# Patient Record
Sex: Female | Born: 1945
Health system: Southern US, Community
[De-identification: ages and names within clinical notes are randomized; demographics above are authoritative.]

## PROBLEM LIST (undated history)

## (undated) DIAGNOSIS — L723 Sebaceous cyst: Secondary | ICD-10-CM

## (undated) DIAGNOSIS — E785 Hyperlipidemia, unspecified: Secondary | ICD-10-CM

## (undated) DIAGNOSIS — B019 Varicella without complication: Secondary | ICD-10-CM

## (undated) DIAGNOSIS — J189 Pneumonia, unspecified organism: Secondary | ICD-10-CM

## (undated) DIAGNOSIS — I10 Essential (primary) hypertension: Secondary | ICD-10-CM

## (undated) DIAGNOSIS — D649 Anemia, unspecified: Secondary | ICD-10-CM

## (undated) DIAGNOSIS — R011 Cardiac murmur, unspecified: Secondary | ICD-10-CM

## (undated) DIAGNOSIS — R7303 Prediabetes: Secondary | ICD-10-CM

## (undated) DIAGNOSIS — C50919 Malignant neoplasm of unspecified site of unspecified female breast: Secondary | ICD-10-CM

## (undated) DIAGNOSIS — M810 Age-related osteoporosis without current pathological fracture: Secondary | ICD-10-CM

## (undated) DIAGNOSIS — E042 Nontoxic multinodular goiter: Secondary | ICD-10-CM

## (undated) DIAGNOSIS — G4730 Sleep apnea, unspecified: Secondary | ICD-10-CM

## (undated) DIAGNOSIS — R0600 Dyspnea, unspecified: Secondary | ICD-10-CM

## (undated) DIAGNOSIS — I714 Abdominal aortic aneurysm, without rupture, unspecified: Secondary | ICD-10-CM

## (undated) DIAGNOSIS — R51 Headache: Secondary | ICD-10-CM

## (undated) DIAGNOSIS — R519 Headache, unspecified: Secondary | ICD-10-CM

## (undated) DIAGNOSIS — Z9981 Dependence on supplemental oxygen: Secondary | ICD-10-CM

## (undated) DIAGNOSIS — J449 Chronic obstructive pulmonary disease, unspecified: Secondary | ICD-10-CM

## (undated) DIAGNOSIS — M1990 Unspecified osteoarthritis, unspecified site: Secondary | ICD-10-CM

## (undated) HISTORY — DX: Essential (primary) hypertension: I10

## (undated) HISTORY — DX: Age-related osteoporosis without current pathological fracture: M81.0

## (undated) HISTORY — DX: Varicella without complication: B01.9

## (undated) HISTORY — DX: Cardiac murmur, unspecified: R01.1

## (undated) HISTORY — PX: COLONOSCOPY: SHX174

## (undated) HISTORY — DX: Chronic obstructive pulmonary disease, unspecified: J44.9

## (undated) HISTORY — PX: APPENDECTOMY: SHX54

## (undated) HISTORY — DX: Unspecified osteoarthritis, unspecified site: M19.90

## (undated) HISTORY — DX: Hyperlipidemia, unspecified: E78.5

## (undated) HISTORY — PX: OTHER SURGICAL HISTORY: SHX169

---
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
## 1978-05-17 HISTORY — PX: TUBAL LIGATION: SHX77

## 1998-03-24 ENCOUNTER — Other Ambulatory Visit: Admission: RE | Admit: 1998-03-24 | Discharge: 1998-03-24 | Payer: Self-pay | Admitting: Obstetrics and Gynecology

## 1999-10-13 ENCOUNTER — Other Ambulatory Visit: Admission: RE | Admit: 1999-10-13 | Discharge: 1999-10-13 | Payer: Self-pay | Admitting: Obstetrics and Gynecology

## 2000-07-01 ENCOUNTER — Encounter: Admission: RE | Admit: 2000-07-01 | Discharge: 2000-07-12 | Payer: Self-pay | Admitting: Family Medicine

## 2004-10-13 ENCOUNTER — Ambulatory Visit (HOSPITAL_COMMUNITY): Admission: RE | Admit: 2004-10-13 | Discharge: 2004-10-13 | Payer: Self-pay | Admitting: Gastroenterology

## 2004-10-13 ENCOUNTER — Encounter (INDEPENDENT_AMBULATORY_CARE_PROVIDER_SITE_OTHER): Payer: Self-pay | Admitting: *Deleted

## 2004-10-14 ENCOUNTER — Other Ambulatory Visit: Admission: RE | Admit: 2004-10-14 | Discharge: 2004-10-14 | Payer: Self-pay

## 2006-08-26 ENCOUNTER — Encounter: Admission: RE | Admit: 2006-08-26 | Discharge: 2006-08-26 | Payer: Self-pay | Admitting: Internal Medicine

## 2006-09-06 ENCOUNTER — Inpatient Hospital Stay (HOSPITAL_COMMUNITY): Admission: EM | Admit: 2006-09-06 | Discharge: 2006-09-14 | Payer: Self-pay | Admitting: Emergency Medicine

## 2006-09-21 ENCOUNTER — Ambulatory Visit (HOSPITAL_COMMUNITY): Admission: RE | Admit: 2006-09-21 | Discharge: 2006-09-21 | Payer: Self-pay | Admitting: General Surgery

## 2006-09-30 ENCOUNTER — Ambulatory Visit (HOSPITAL_COMMUNITY): Admission: RE | Admit: 2006-09-30 | Discharge: 2006-09-30 | Payer: Self-pay | Admitting: General Surgery

## 2006-10-07 ENCOUNTER — Ambulatory Visit (HOSPITAL_COMMUNITY): Admission: RE | Admit: 2006-10-07 | Discharge: 2006-10-07 | Payer: Self-pay | Admitting: Obstetrics and Gynecology

## 2006-11-07 ENCOUNTER — Ambulatory Visit (HOSPITAL_COMMUNITY): Admission: RE | Admit: 2006-11-07 | Discharge: 2006-11-07 | Payer: Self-pay | Admitting: General Surgery

## 2006-11-29 ENCOUNTER — Encounter (INDEPENDENT_AMBULATORY_CARE_PROVIDER_SITE_OTHER): Payer: Self-pay | Admitting: General Surgery

## 2006-11-30 ENCOUNTER — Inpatient Hospital Stay (HOSPITAL_COMMUNITY): Admission: RE | Admit: 2006-11-30 | Discharge: 2006-12-05 | Payer: Self-pay | Admitting: General Surgery

## 2008-03-05 ENCOUNTER — Other Ambulatory Visit: Admission: RE | Admit: 2008-03-05 | Discharge: 2008-03-05 | Payer: Self-pay | Admitting: Otolaryngology

## 2008-03-15 ENCOUNTER — Encounter: Admission: RE | Admit: 2008-03-15 | Discharge: 2008-03-15 | Payer: Self-pay | Admitting: Otolaryngology

## 2010-09-29 NOTE — Op Note (Signed)
NAME:  Victoria, [name] NO.:  [id number]   MEDICAL RECORD NO.:  [id number]          PATIENT TYPE:  OIB   LOCATION:  1528                         FACILITY:  [facility]   PHYSICIAN:  Adolph Pollack, M.D.DATE OF BIRTH:  07-15-1945   DATE OF PROCEDURE:  11/29/2006  DATE OF DISCHARGE:                               OPERATIVE REPORT   PREOP DIAGNOSIS:  Right lower quadrant/pelvic inflammatory mass.   POSTOPERATIVE DIAGNOSIS:  Right lower quadrant/pelvic inflammatory mass.   PROCEDURE:  Diagnostic laparoscopy.   SURGEON:  Adolph Pollack, M.D.   INDICATIONS:  This 65 year old female has had a chronic right lower  quadrant inflammatory process etiology is unknown.  She presents now for  a diagnostic laparoscopy with possible removal of the mass or need for  conversion exploratory laparotomy.   TECHNIQUE:  She was brought to the operating room, placed supine on the  operating table and general anesthetic was administered.  A Foley  catheter was placed in the bladder; and she was placed in the lithotomy  position.  The abdominal wall and pelvic area were sterilely prepped and  draped.  The supraumbilical incision was made through skin, subcutaneous  tissue, fascia, and peritoneum; entering the peritoneal cavity under  direct vision.  A pursestring suture of #0 Vicryl was placed around the  fascial edges.  A Hassan trocar was introduced into the peritoneal  cavity; and a pneumoperitoneum created by insufflation of CO2 gas.   Next the laparoscope was introduced.  She was placed in the steep  Trendelenburg position.  No significant adhesions between the omentum or  intestine and the anterior abdominal wall were noted.  I placed a 5-mm  trocar in the lower midline.  I began identifying the left ovary which  appeared to be normal; and the uterus appeared to be normal.  There were  some of adhesions to the right lower quadrant; and upon manipulating  these, I noticed an  inflammatory process.  There is some purulent fluid  present.   I placed a 5-mm trocar in the right upper quadrant and used it to  evacuate some of the purulent fluid.  I subsequently was able to  mobilize and identify the appendix.  The base of the appendix appeared  normal.  There did appear to be  some periappendiceal inflammation.  I then identified the area of the  right ovary; and upon manipulating this,  purulent material continued to  drain out from an area.  Based on this I did not think that we would be  able to proceed laparoscopy; and, thus, exploratory laparotomy was  planned to be dictated in a separate note.      Adolph Pollack, M.D.  Electronically Signed     TJR/MEDQ  D:  11/29/2006  T:  11/29/2006  Job:  [id number]   cc:   Juluis Mire, M.D.  Fax: [phone]   Olene Craven, M.D.  Fax: [phone]

## 2010-09-29 NOTE — H&P (Signed)
NAME:  Victoria [name], Victoria [name] NO.:  [id number]   MEDICAL RECORD NO.:  [id number]          PATIENT TYPE:  OIB   LOCATION:  1528                         FACILITY:  [facility]   PHYSICIAN:  Adolph Pollack, M.D.DATE OF BIRTH:  January 03, 1946   DATE OF ADMISSION:  11/29/2006  DATE OF DISCHARGE:                              HISTORY & PHYSICAL   REASON:  Elective operation.   HISTORY OF PRESENT ILLNESS:  Victoria [name] is a  65 year old female who has  a chronic ongoing right lower quadrant/pelvic inflammatory process.  Her  illness started back in the spring, in early April where she developed  lower abdominal pain and was felt to have urinary tract infection.  For  3 weeks she was on different rounds of antibiotics.  Eventually she  continued to have right lower quadrant pelvic pain and a leukocytosis  developed.  A CT scan demonstrated a complex inflammatory process in the  right pelvic region.  She was  admitted to the hospital and underwent  antibiotic treatment and percutaneous drainage and improved.  Since that  time, we have been following her with serial x-rays and CT scans  expecting this to resolve.  She is currently asymptomatic, has been off  antibiotics for a long time but continues to have evidence of a right  pelvic inflammatory mass.  She has been seen by Dr. Richardean Chimera, and now  is admitted for a laparoscopy, possible laparotomy and removal of this  inflammatory mass which could include appendix and ovary.   PAST MEDICAL HISTORY:  1. Asthma  2. Chronic thyroid nodule.  3. Hypertension.  4. Hypercholesterolemia.  5. Gastritis.  6. Pneumonia.   PREVIOUS OPERATIONS:  Cesarean section.   ALLERGIES:  CODEINE, BENICAR, AVAPRO.   MEDICATIONS:  Lisinopril, HCTZ, lovastatin, Advair inhaler, Norvasc,  Allegra, Clinoril   SOCIAL HISTORY:  She has a smoker and smokes a half pack of cigarettes a  day.  She occasionally uses alcohol.  She is single.   FAMILY HISTORY:   Notable for coronary artery disease, diabetes and  Alzheimer's.   PHYSICAL EXAM:  GENERAL:  A well-developed, well-nourished female.  She  is in no acute distress, pleasant and cooperative.  EYES:  Extraocular motions intact.  No icterus noted.  NECK is supple with good range of motion.  RESPIRATORY:  The breath sounds are equal and clear and the respirations  are nonlabored.  CARDIOVASCULAR: Regular rate, regular rhythm.  No murmur.  ABDOMEN: Soft with lower midline scar.  No palpable masses or  tenderness.  MUSCULOSKELETAL:  SCD hose on lower extremities.   IMPRESSION:  Right lower quadrant/pelvic inflammatory mass ill-defined.  Could be tubo-ovarian abscess or chronic appendiceal abscess.   PLAN:  Diagnostic laparoscopy, possible exploratory laparotomy.      Adolph Pollack, M.D.  Electronically Signed     TJR/MEDQ  D:  11/29/2006  T:  11/29/2006  Job:  [id number]

## 2010-09-29 NOTE — Op Note (Signed)
NAME:  Victoria [name], RHEE NO.:  [id number]   MEDICAL RECORD NO.:  [id number]          PATIENT TYPE:  OIB   LOCATION:  1528                         FACILITY:  [facility]   PHYSICIAN:  Adolph Pollack, M.D.DATE OF BIRTH:  1946-03-09   DATE OF PROCEDURE:  11/29/2006  DATE OF DISCHARGE:                               OPERATIVE REPORT   PREOPERATIVE DIAGNOSIS:  Right lower quadrant pelvic inflammatory mass.   POSTOPERATIVE DIAGNOSIS:  Right lower quadrant pelvic inflammatory mass.   PROCEDURE:  1. Exploratory laparotomy.  2. Appendectomy,.  3. Right oophorectomy.   SURGEON:  Adolph Pollack, M.D.   Mammie LorenzoJuluis Mire, M.D.   ANESTHESIA:  General.   INDICATIONS:  This 65 year old female has a chronic right lower  quadrant/right pelvic inflammatory process.  I performed a diagnostic  laparoscopy; and discovered her to have a somewhat of an abscess which  could have been either emanating from the appendix or the ovary, but I  did not think the operation could be performed laparoscopically; thus,  the laparoscopic procedure was aborted; and now we are proceeding with  exploratory laparotomy.   TECHNIQUE:  I made a lower midline incision through the previous scar  dividing the skin, subcutaneous tissue, and peritoneum.  A self-  retaining retractor was placed.  I had mobilized the appendix  laparoscopically; and I brought this up into the wound.  There was one  area of the mid appendix that appeared to be somewhat disrupted.   I amputated the appendix off of the cecum with a GIA stapler; and then  divided the mesoappendix between clamps and ligated the vessels with  Vicryl ties.  I then handed the appendix off the field.  There did not  to appear to be any evidence of Crohn's disease.   Following this, Dr. Arelia Sneddon then performed a right oophorectomy.  There  was some purulent drainage from the ovarian area and I sent this for  culture.  His oophorectomy  will be dictated in separate note.   After the oophorectomy was complete, the pelvic area was then copiously  irrigated with saline solution; the solution evacuated.  Hemostasis was  noted be adequate.  The instrument count and sponge count were reported  to be correct.  I then closed the lower midline fascia with a running #1  PDS suture.  The subcutaneous tissue was irrigated.  The supraumbilical  fascial defect was closed by tightening up and tying down the  pursestring suture.  All skin incisions were then closed with staples;  and sterile dressings were applied.   She tolerated the procedure well without apparent complications; and was  taken to recovery in satisfactory condition.      Adolph Pollack, M.D.  Electronically Signed     TJR/MEDQ  D:  11/29/2006  T:  11/29/2006  Job:  [id number]   cc:   Juluis Mire, M.D.  Fax: [phone]   Olene Craven, M.D.  Fax: [phone]

## 2010-09-29 NOTE — Op Note (Signed)
NAME:  Victoria [name], Victoria [name] NO.:  [id number]   MEDICAL RECORD NO.:  [id number]          PATIENT TYPE:  OIB   LOCATION:  1528                         FACILITY:  [facility]   PHYSICIAN:  Juluis Mire, M.D.   DATE OF BIRTH:  1945/07/06   DATE OF PROCEDURE:  11/29/2006  DATE OF DISCHARGE:                               OPERATIVE REPORT   PREOPERATIVE DIAGNOSIS:  Pelvic abscess.   POSTOPERATIVE DIAGNOSIS:  Probable tubal-ovarian abscess, possible  involvement of the appendix.   PROCEDURE:  Right salpingo-oophorectomy.   SURGEON:  Juluis Mire, M.D.   ASSISTANT:  Adolph Pollack, M.D.   ESTIMATED BLOOD LOSS:  Was 200 to 300 mL.   PACKS/DRAINS:  None.   INTRAOPERATIVE BLOOD PLACEMENT:  None.   COMPLICATIONS:  None.   INDICATIONS FOR PROCEDURE:  The patient is a 65 year old post-menopausal  patient who has been followed with a right pelvic abscess.  It has  failed to resolve over time.  The decision to proceed with exploratory  surgery.  The risks were explained, including the risks of infection,  the risks of hemorrhage that could require transfusion, the risks of  hepatitis, the risks of injury to adjacent organs including bladder,  bowel or ureters, could require further exploratory surgery, the risks  of deep venous thrombosis and pulmonary embolus.   DESCRIPTION OF PROCEDURE:  Of note, Dr. Abbey Chatters had already  laparoscoped the patient.  We discovered the possibility of a combined  issue with the appendix and the right tube and ovary.  The decision to  proceed with an exploratory surgery.  Dr. Abbey Chatters proceeded with a  midline incision.  He did do an appendectomy.  At this point I came in.  We elevated the right ovary.  There was purulent material coming from  the ovary and tube.  We elevated the peritoneum over the right psoas  muscle and incised that and extended the incision up to the round  ligament and also cephalad.  We developed the right  retroperitoneal  space.  We identified the ureter.  We isolated the ovarian vasculature  above the ureter and clamped and cut it and doubly ligated first with a  free tie of #0 Vicryl and a suture ligature of #0 Vicryl.  We dissected  the ovary from its peritoneal attachments up to the utero-ovarian  pedicle.  It was clamped and cut and the uterus and tube were passed off  the operative field.  The utero-ovarian pedicles were then secured with  a suture ligature of #0 Vicryl.  Some oozing was encountered and also  brought under control with suture ligature of #0 Vicryl.  We identified  the ureter again and felt it was well out of the operative field.  We  used cautery ring for further hemostasis along the peritoneal edge.  We  then thoroughly irrigated the pelvis.  At this time we had good  hemostasis.  The uterus was unremarkable.  The left tube and ovary were  also clear.  At this point in time, the self-retaining retractor and  packs were removed.  Dr. Abbey Chatters completed closure of the abdominal  cavity.  The sponge, needle and instrument counts were correct by the  circulating nurse x2.  The Foley catheter remained clear at the time of  closure.   The patient tolerated the procedure well and was returned to the  recovery room in good condition.      Juluis Mire, M.D.  Electronically Signed     JSM/MEDQ  D:  11/29/2006  T:  11/29/2006  Job:  [id number]

## 2010-10-02 NOTE — H&P (Signed)
NAME:  Victoria [name], Victoria [name] NO.:  [id number]   MEDICAL RECORD NO.:  [id number]          PATIENT TYPE:  EMS   LOCATION:  ED                           FACILITY:  [facility]   PHYSICIAN:  Adolph Pollack, M.D.DATE OF BIRTH:  June 29, 1945   DATE OF ADMISSION:  09/05/2006  DATE OF DISCHARGE:                              HISTORY & PHYSICAL   CHIEF COMPLAINT:  Right pelvic pain and right lower quadrant pain.   HISTORY OF PRESENT ILLNESS:  This 65 year old female about 3 weeks ago  had a large BM and then developed some lower abdominal discomfort that  persisted.  She subsequently presented to [facility] and was told she  had a urinary tract infection.  She is placed on Cipro but continued to  have the pain.  She had an antibiotic shot and was placed on antibiotic  pills.  She continued to have problems with the pain.  She had a chill  on Friday, a few days ago.  She has had an outpatient ultrasound which  was negative.  She went back to [facility] today as she was feeling  worse with increasing pain and was noted to have a leukocytosis.  She  was sent to [facility] emergency department for evaluation.  White  count of 20,500 was noted.  A CT scan was ordered for the abdomen and  pelvis.  It demonstrated a complex inflammatory process in the right  pelvis which could be secondary to appendicitis versus tubo-ovarian  abscess.  At that point, I was asked to see her.  She continues to  complain of right pelvic-type pain and has had anorexia.  She has not  had a significant fever she tells me.   PAST MEDICAL HISTORY:  1. Hypertension.  2. Thyroid nodule.  3. Hypercholesterolemia.  4. Gastritis.  5. Pneumonia.  6. Asthma.   PREVIOUS OPERATIONS:  Cesarean section.   ALLERGIES:  Codeine.   MEDICATIONS:  Advair Diskus, lovastatin, HCTZ, amlodipine, Allegra,  aspirin.   SOCIAL HISTORY:  She is single and her son lives with her.  She has a  long history of tobacco use.   Occasional alcohol use.   REVIEW OF SYSTEMS:  CARDIOVASCULAR:  She denies any circulatory problems  or heart disease.  PULMONARY:  She denies tuberculosis.  GI:  She denies  peptic ulcer disease, hepatitis or diverticulitis.  GU: No kidney  stones.  NEUROLOGIC:  No strokes or seizures.  HEMATOLOGIC:  No bleeding  disorders, transfusions or blood clots.   PHYSICAL EXAMINATION:  GENERAL:  This is a moderately overweight female  who appears somewhat uncomfortable.  VITAL SIGNS:  Temperature 99.4, blood pressure 158/85, and pulse 84.  EYES:  Extraocular motions are intact.  No icterus.  NECK:  Supple with a palpable left thyroid nodule in the lower neck  area.  RESPIRATORY:  Breath sounds are equal.  Clear respirations, nonlabored.  CARDIOVASCULAR:  Regular rate and regular rhythm.  No murmur heard.  ABDOMEN:  Soft with lower midline scar.  There is right lower quadrant  and pelvic region tenderness as well as  suprapubic tenderness to  palpation.  No peritoneal signs, however.  Active bowel sounds are  noted.  No hernias.  MUSCULOSKELETAL:  No edema.  Good muscle tone.   LABORATORY DATA:  Notable for white cell count of 20,500 and hemoglobin  13.5.  Potassium 3.2, glucose 122, albumin 2.8.  CT scan was reviewed.   IMPRESSION:  1. Right lower quadrant complex inflammatory process which could be      secondary to a chronic-type appendicitis versus tubo-ovarian      abscess.  I do not think this is an acute (24 to 48 hour) issue as      she has been having symptoms for about 3 weeks with intermittent      treatment for urinary tract infection.  2. Hypokalemia.  3. Hypertension.  4. Thyroid nodule.   PLAN:  We will admitted her to the hospital and start her on broad-  spectrum intravenous antibiotics.  We will follow her clinical course  and white blood cell count and potentially repeat the CT scan in a few  days to see if we can monitor some improvement or if there becomes an  obvious  fluid collection then it can be percutaneously drained.  As for  the thyroid nodule, I explained to her that she would need to have that  followed by Dr. Andi Devon and  potentially have a fine-needle aspiration  done.      Adolph Pollack, M.D.  Electronically Signed     TJR/MEDQ  D:  09/06/2006  T:  09/06/2006  Job:  [id number]   cc:   Gabriel Earing, M.D.  Fax: [phone]

## 2010-10-02 NOTE — Consult Note (Signed)
NAME:  Victoria [name], Victoria [name] NO.:  [id number]   MEDICAL RECORD NO.:  [id number]          PATIENT TYPE:  INP   LOCATION:  1323                         FACILITY:  [facility]   PHYSICIAN:  Freddy Finner, M.D.   DATE OF BIRTH:  May 27, 1945   DATE OF CONSULTATION:  09/13/2006  DATE OF DISCHARGE:                                 CONSULTATION   REFERRING PHYSICIAN:  Adolph Pollack, M.D.   The patient is a 65 year old African-American widowed female, gravida 3,  para 1, AB 2, whose present illnesses are completely outlined in Dr.  Maris Berger note and consists of approximately 3-1/2 weeks of illness  with first presentation on March 30 to Urgent Care at which time she was  thought to have a urinary tract infection.  Followup visit there after  treatment with Cipro resulted in a pelvic ultrasound which is said to  have been normal.  Of significance in her GYN history is the fact that  she has not been sexually active for the last 13 years and that she had  a previous bilateral tubal ligation for contraceptive reasons.   Her Chief Complaint on admission was the onset of very severe right  lower quadrant and lower abdominal pain after a large bowel movement.  Extensive evaluation and treatment has already been completed including  CTs of the abdomen on two different occasions showing a pelvic mass on  the right side with question of appendiceal abscess versus tubo-ovarian  abscess.  Skinny needle aspiration of the mass has produced 15 mL of  what appears to be sterile pus at this point in time.   I had a discussion with [name] regarding her current medical  situation.  I shared with her that possibility of tubal infection of the  typical type seems extremely unlikely given her previous tubal  sterilization and the lack of any exposure to sexually transmitted  disease at this point in time.  The patient is very articulate and  oriented and has a full understanding.  Her last  followup in our office  was with Dr. Arelia Sneddon in 2001.  Apparently she does discontinued care only  because of the change in insurance policy in our office regarding  [employer].  She states that she has not had a pelvic exam or Pap smear  since that time but did in fact have a mammogram approximately 2 years  ago with [facility].   I completely concur with the plan of management at the present time.  I  have indicated verbally and now in writing that we will be happy to  participate in surgical intervention in the event that this becomes  necessary.   Thanks for the consultation.      Freddy Finner, M.D.  Electronically Signed     WRN/MEDQ  D:  09/13/2006  T:  09/13/2006  Job:  [id number]   cc:   Adolph Pollack, M.D.  [address]  [place]  [place] [address]   Juluis Mire, M.D.  Fax: [phone]

## 2010-10-02 NOTE — Discharge Summary (Signed)
NAME:  Victoria [name], Victoria [name] NO.:  [id number]   MEDICAL RECORD NO.:  [id number]          PATIENT TYPE:  INP   LOCATION:  1528                         FACILITY:  [facility]   PHYSICIAN:  Adolph Pollack, M.D.DATE OF BIRTH:  06-Aug-1945   DATE OF ADMISSION:  11/30/2006  DATE OF DISCHARGE:  12/05/2006                               DISCHARGE SUMMARY   PRINCIPAL DISCHARGE DIAGNOSIS:  Right pelvic inflammatory process  secondary to chronic tubo-ovarian abscess.   SECONDARY DIAGNOSES:  1. Hypertension.  2. Mild postoperative ileus.   PROCEDURES:  Diagnostic laparoscopy converted to exploratory laparotomy,  appendectomy and right oophorectomy on November 29, 2006.   REASON FOR ADMISSION:  This is a 65 year old female with a complex right  pelvic inflammatory process that has been persistent over time.  It is  felt this is either secondary to chronic ruptured appendix or ovarian  pathology.  She has had an extensive workup by myself and Dr. Richardean Chimera.  The process persists and she was subsequently admitted.   HOSPITAL COURSE:  She underwent the above procedure and actually  tolerated it fairly well.  She was started on sips of liquids the day  after her surgery.  She is maintained on IV cefoxitin.  Hemoglobin was  stable at 12.5.  her ileus slowly resolved.  The wound looked good.  By  her sixth postoperative day, she was tolerating a diet.  She is having  bowel movements and flatus.  Blood pressures are under fair control.  She is able to be discharged.   DISPOSITION:  Discharged to home in satisfactory condition.  She will be  given discharge instructions and Tylox for pain.  She is to continue her  home medications and follow up with me in 3 days for staple removal.      Adolph Pollack, M.D.  Electronically Signed     TJR/MEDQ  D:  12/15/2006  T:  12/16/2006  Job:  [id number]   cc:   Juluis Mire, M.D.  Fax: [phone]   Olene Craven, M.D.  Fax:  [phone]

## 2010-10-02 NOTE — Discharge Summary (Signed)
NAME:  Victoria [name], Victoria [name] NO.:  [id number]   MEDICAL RECORD NO.:  [id number]          PATIENT TYPE:  INP   LOCATION:  1323                         FACILITY:  [facility]   PHYSICIAN:  Adolph Pollack, M.D.DATE OF BIRTH:  08-15-1945   DATE OF ADMISSION:  09/05/2006  DATE OF DISCHARGE:                               DISCHARGE SUMMARY   PRINCIPAL DISCHARGE DIAGNOSIS:  Right pelvic infectious/inflammatory  process (abscess).   SECONDARY DIAGNOSES:  1. Hypertension.  2. Hyperlipidemia.  3. Thyroid nodule.  4. Gastritis.  5. Pneumonia.  6. Asthma.   PROCEDURE:  Percutaneous aspiration of the right lower quadrant  infectious/inflammatory process by interventional radiology September 11, 2006.   REASON FOR ADMISSION:  Ms. Lallier is a 65 year old female who, 3 weeks  prior to admission, had lower abdominal discomfort that persisted.  It  was felt that she had a urinary tract infection and she received  antibiotics and still had some symptoms, so received other types of  antibiotics as well.  She continued to have symptoms and worsening right  pelvic and lower quadrant pain.  She had a leukocytosis at [facility]  when she was being treated.  She was sent to the emergency department at  [facility] and had a white cell count of 20,500, and a CT scan  that demonstrated a complex right pelvic/lower quadrant inflammatory  process which could have been secondary to a chronic ruptured  appendicitis versus a tubo-ovarian abscess.  She subsequently was  admitted.   HOSPITAL COURSE:  She was admitted and placed on intravenous  antibiotics.  She slowly began improving.  CT scan was repeated September 09, 2006, which showed some coalescence and some fluid collection,  slight increase in size of inflammatory process.  White cell count at  that time was down to 16,300.  A percutaneous aspiration was arranged  and purulent material evacuated and sent for culture.  It came back  showing some gram-variable rods as well as streptococci.  After the  aspiration, she began feeling better.  Her white blood cell count  started to decline.  A GYN consultation was obtained by Dr. Abbey Chatters,  and he had concurred with the possibility of the appendix or tubo-  ovarian abscess as being the etiology; however, felt that surgery was  not needed, as well, as she was improving.  She was started on a diet as  a regular diet, bowels were moving.  She had decreasing pain.  On September 14, 2006, after a little over a week of IV Zosyn, her white count was  down at 12,800.  She had remained afebrile, was nontender and was able  to be discharged.   DISPOSITION:  Discharged to home on p.o. antibiotics (Augmentin and  Flagyl).  She will follow up with me next week for repeat examination  and the scheduled repeat CT scan.  She had been given discharge  instructions.  The  long-term plan for her would be hopefully to have this process resolved  and do more focused studies on the right ovary as well, and,  potentially, she may need a delayed appendectomy or right salpingo-  oophorectomy or both.  I have discussed this with her.      Adolph Pollack, M.D.  Electronically Signed     TJR/MEDQ  D:  09/14/2006  T:  09/14/2006  Job:  [id number]   cc:   Freddy Finner, M.D.  Fax: [phone]   Gabriel Earing, M.D.  Fax: [phone]

## 2010-12-14 ENCOUNTER — Ambulatory Visit: Payer: Self-pay | Admitting: Internal Medicine

## 2011-01-21 ENCOUNTER — Ambulatory Visit (INDEPENDENT_AMBULATORY_CARE_PROVIDER_SITE_OTHER): Payer: Medicare PPO | Admitting: Internal Medicine

## 2011-01-21 ENCOUNTER — Encounter: Payer: Self-pay | Admitting: Internal Medicine

## 2011-01-21 DIAGNOSIS — M129 Arthropathy, unspecified: Secondary | ICD-10-CM

## 2011-01-21 DIAGNOSIS — I10 Essential (primary) hypertension: Secondary | ICD-10-CM

## 2011-01-21 DIAGNOSIS — M1990 Unspecified osteoarthritis, unspecified site: Secondary | ICD-10-CM

## 2011-01-21 DIAGNOSIS — E785 Hyperlipidemia, unspecified: Secondary | ICD-10-CM

## 2011-01-21 NOTE — Progress Notes (Signed)
Subjective:    Patient ID: Victoria [name], female    DOB: Aug 27, 1945, 65 y.o.   MRN: [id number]  HPI Victoria [name] presents to establish for on-going continuity care on referral from Dr. Arelia Sneddon. She is feeling well and has no specific problems to address today.   Past Medical History  Diagnosis Date  . Asthma     uses advair   . Arthritis     left ankle, right knee, right SI joint, wrists  . Varicella as child  . 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 Hypertension   . Hyperlipidemia   . Tuberculosis 2009    h/o positive PPD- seen at health department, had  normal chest x-ray. she was not treated for a  new conversion.   Past Surgical History  Procedure Date  . Excision of pelvic absess, right ovary     2008  . Appendectomy     2008  . Cesarean section     1972   Family History  Problem Relation Age of Onset  . Heart disease Mother     MI - fatal  . Hypertension Mother   . Stroke Father 54    fatal  . Alzheimer's disease Father   . Alzheimer's disease Brother   . Hyperlipidemia Brother   . Hypertension Brother   . Diabetes Brother   . Hypertension Brother   . Hyperlipidemia Brother    History   Social History  . Marital Status: Divorced    Spouse Name: N/A    Number of Children: 1  . Years of Education: 16   Occupational History  . 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 Smoking status: Current Everyday Smoker -- 0.5 packs/day for 40 years    Types: Cigarettes  . Smokeless tobacco: Never Used  . Alcohol Use: Yes     rare occasion  . Drug Use: No  . Sexually Active: No   Other Topics Concern  . Not on file   Social History Narrative   HSG; Victoria [name], Victoria [name]-political science. . Married '69 - 9 yrs/divorced. 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 No h/o physical or sexual abuse. ACP - no living will - wants information. Provided packet of information.         Review of Systems Review of Systems  Constitutional:  Negative for fever, chills, activity change and unexpected weight change.  HEENT:  Negative for hearing loss, ear pain, congestion, neck stiffness and postnasal drip. Negative for sore throat or swallowing problems. Negative for dental complaints.   Eyes: Negative for vision loss or change in visual acuity.  Respiratory: Negative for chest tightness and wheezing.   Cardiovascular: Negative for chest pain and palpitation. No decreased exercise tolerance Gastrointestinal: No change in bowel habit. No bloating or gas. No reflux or indigestion Genitourinary: Negative for urgency, frequency, flank pain and difficulty urinating.  Musculoskeletal: Positive for myalgias, back pain, arthralgias and gait problem.  Neurological: Negative for dizziness, tremors, weakness and headaches.  Hematological: Negative for adenopathy.  Psychiatric/Behavioral: Negative for behavioral problems and dysphoric mood.       Objective:   Physical Exam Vitals - mild elevation in BP noted Gen'l - WNWD AA woman in no distress HEENT - Slater/AT, C&S clear, PERRLA, no oral lesions, upper denture, lower partial Neck- supple, no thyromegaly Chest - no deformity Breast exam - deferred to gyn Resp - normal Cor -  RRR, no M/R/G, 2+ peripheral pulse Abd - BS+ Pelvic/rectal- deferred to gyn Extremity - no deformity noted.        Assessment & Plan:  Summary - a very nice woman who has established for continuity care. She is oriented to the services and process of the office. She will return in several weeks after old records for labs can be obtained and reviewed. In the meantime refills are provided for all her chronic medications.

## 2011-01-23 DIAGNOSIS — M1710 Unilateral primary osteoarthritis, unspecified knee: Secondary | ICD-10-CM | POA: Insufficient documentation

## 2011-01-23 DIAGNOSIS — I10 Essential (primary) hypertension: Secondary | ICD-10-CM | POA: Insufficient documentation

## 2011-01-23 DIAGNOSIS — M179 Osteoarthritis of knee, unspecified: Secondary | ICD-10-CM | POA: Insufficient documentation

## 2011-01-23 DIAGNOSIS — E785 Hyperlipidemia, unspecified: Secondary | ICD-10-CM | POA: Insufficient documentation

## 2011-01-23 NOTE — Assessment & Plan Note (Signed)
No joint deformity noted: no inflammatory changes noted.  Plan - continue to be physically active - water exercise is very beneficial

## 2011-01-23 NOTE — Assessment & Plan Note (Signed)
Patinet on and tolerating statin therapy.   Plan- she is asked to sign release so that old lab records can be obtained - recommendations to follow review.

## 2011-01-23 NOTE — Assessment & Plan Note (Signed)
Medications include CCB, ACE-I, diuretic. BP today is mildly elevated  Plan - monitor BP at home and report back if SBP is running consistenty above 140 - will adjust medications.

## 2011-01-27 ENCOUNTER — Telehealth: Payer: Self-pay | Admitting: Internal Medicine

## 2011-01-27 NOTE — Telephone Encounter (Signed)
Received copies from [facility] Department, on 01/27/2011. Forwarded 5 pages to Dr. Debby Bud, for review.

## 2011-03-01 ENCOUNTER — Telehealth: Payer: Self-pay | Admitting: Internal Medicine

## 2011-03-01 LAB — BASIC METABOLIC PANEL
BUN: 2 — ABNORMAL LOW
BUN: 9
Calcium: 9
Creatinine, Ser: 0.72
GFR calc non Af Amer: >60
GFR calc non Af Amer: >60

## 2011-03-01 LAB — CBC
HCT: 36.8
MCHC: 34.4
MCV: 86.9
MCV: 87.3
Platelets: 198
Platelets: 240
RDW: 14.7 — ABNORMAL HIGH
WBC: 10
WBC: 15.6 — ABNORMAL HIGH

## 2011-03-01 LAB — ANAEROBIC CULTURE

## 2011-03-01 NOTE — Telephone Encounter (Signed)
Forwarded to Dr. Norins for review. °

## 2011-03-02 LAB — DIFFERENTIAL
Basophils Absolute: 0
Basophils Relative: 0
Eosinophils Absolute: 0.1
Eosinophils Relative: 2
Monocytes Absolute: 0.6
Neutro Abs: 4.9

## 2011-03-02 LAB — COMPREHENSIVE METABOLIC PANEL
ALT: 11
Albumin: 3.4 — ABNORMAL LOW
Alkaline Phosphatase: 79
BUN: 9
Chloride: 105
Potassium: 4.2
Total Bilirubin: 0.7

## 2011-03-02 LAB — TYPE AND SCREEN: ABO/RH(D): O POS

## 2011-03-02 LAB — CBC
HCT: 39.3
Hemoglobin: 13.5
Platelets: 259
WBC: 8.1

## 2011-03-04 ENCOUNTER — Telehealth: Payer: Self-pay | Admitting: Internal Medicine

## 2011-03-04 NOTE — Telephone Encounter (Signed)
Forwarded to Dr. Norins for review. °

## 2011-03-04 NOTE — Telephone Encounter (Signed)
Received copies from Dr. Orbie Pyo 03/04/2011. Forwarded  28pages to Dr. Reva Bores review.

## 2011-03-09 ENCOUNTER — Telehealth: Payer: Self-pay | Admitting: Internal Medicine

## 2011-03-09 NOTE — Telephone Encounter (Signed)
Received 82 pages from [facility]. Forwarded to Dr. Debby Bud for review. 03/09/11-ar

## 2011-04-18 ENCOUNTER — Emergency Department (HOSPITAL_COMMUNITY)
Admission: EM | Admit: 2011-04-18 | Discharge: 2011-04-18 | Disposition: A | Discharge status: Home / Self Care | Payer: Medicare PPO | Attending: Emergency Medicine | Admitting: Emergency Medicine

## 2011-04-18 ENCOUNTER — Encounter (HOSPITAL_COMMUNITY): Payer: Self-pay

## 2011-04-18 ENCOUNTER — Emergency Department (HOSPITAL_COMMUNITY): Payer: Medicare PPO

## 2011-04-18 DIAGNOSIS — I10 Essential (primary) hypertension: Secondary | ICD-10-CM | POA: Insufficient documentation

## 2011-04-18 DIAGNOSIS — R059 Cough, unspecified: Secondary | ICD-10-CM | POA: Insufficient documentation

## 2011-04-18 DIAGNOSIS — R05 Cough: Secondary | ICD-10-CM | POA: Insufficient documentation

## 2011-04-18 DIAGNOSIS — J45909 Unspecified asthma, uncomplicated: Secondary | ICD-10-CM | POA: Insufficient documentation

## 2011-04-18 DIAGNOSIS — R509 Fever, unspecified: Secondary | ICD-10-CM | POA: Insufficient documentation

## 2011-04-18 DIAGNOSIS — R0602 Shortness of breath: Secondary | ICD-10-CM | POA: Insufficient documentation

## 2011-04-18 DIAGNOSIS — E785 Hyperlipidemia, unspecified: Secondary | ICD-10-CM | POA: Insufficient documentation

## 2011-04-18 LAB — URINALYSIS, ROUTINE W REFLEX MICROSCOPIC
Nitrite: NEGATIVE
Protein, ur: 30 mg/dL — AB
Specific Gravity, Urine: 1.039 — ABNORMAL HIGH (ref 1.005–1.030)
Urobilinogen, UA: 1 mg/dL (ref 0.0–1.0)

## 2011-04-18 LAB — COMPREHENSIVE METABOLIC PANEL
BUN: 15 mg/dL (ref 6–23)
CO2: 26 mEq/L (ref 19–32)
Calcium: 9.7 mg/dL (ref 8.4–10.5)
Chloride: 96 mEq/L (ref 96–112)
Creatinine, Ser: 0.85 mg/dL (ref 0.50–1.10)
GFR calc Af Amer: 80 mL/min — ABNORMAL LOW (ref >90)
GFR calc non Af Amer: 69 mL/min — ABNORMAL LOW (ref >90)
Glucose, Bld: 85 mg/dL (ref 70–99)
Total Bilirubin: 0.2 mg/dL — ABNORMAL LOW (ref 0.3–1.2)

## 2011-04-18 LAB — URINE MICROSCOPIC-ADD ON

## 2011-04-18 LAB — CBC
HCT: 43.9 % (ref 36.0–46.0)
Hemoglobin: 14.6 g/dL (ref 12.0–15.0)
MCH: 29.4 pg (ref 26.0–34.0)
MCV: 88.3 fL (ref 78.0–100.0)
RBC: 4.97 MIL/uL (ref 3.87–5.11)

## 2011-04-18 MED ORDER — ALBUTEROL SULFATE (5 MG/ML) 0.5% IN NEBU
5.0000 mg | INHALATION_SOLUTION | Freq: Once | RESPIRATORY_TRACT | Status: AC
  Administered 2011-04-18: 5 mg via RESPIRATORY_TRACT
  Filled 2011-04-18: qty 1

## 2011-04-18 MED ORDER — ALBUTEROL SULFATE HFA 108 (90 BASE) MCG/ACT IN AERS
1.0000 | INHALATION_SPRAY | Freq: Four times a day (QID) | RESPIRATORY_TRACT | Status: DC | PRN
Start: 2011-04-18 — End: 2011-06-04

## 2011-04-18 MED ORDER — HYDROCODONE-ACETAMINOPHEN 5-325 MG PO TABS: 2.0000 | ORAL_TABLET | ORAL | Status: AC | PRN

## 2011-04-18 MED ORDER — PREDNISONE 20 MG PO TABS
60.0000 mg | ORAL_TABLET | Freq: Once | ORAL | Status: AC
  Administered 2011-04-18: 60 mg via ORAL
  Filled 2011-04-18: qty 3

## 2011-04-18 MED ORDER — HYDROCODONE-ACETAMINOPHEN 5-325 MG PO TABS
1.0000 | ORAL_TABLET | Freq: Once | ORAL | Status: AC
  Administered 2011-04-18: 1 via ORAL
  Filled 2011-04-18: qty 1

## 2011-04-18 MED ORDER — IPRATROPIUM BROMIDE 0.02 % IN SOLN
0.5000 mg | Freq: Once | RESPIRATORY_TRACT | Status: AC
  Administered 2011-04-18: 0.5 mg via RESPIRATORY_TRACT
  Filled 2011-04-18: qty 2.5

## 2011-04-18 MED ORDER — PREDNISONE 10 MG PO TABS: 20.0000 mg | ORAL_TABLET | Freq: Every day | ORAL | Status: DC

## 2011-04-18 NOTE — ED Notes (Signed)
Pt in from home with c/o fever and cough since Friday states has worsened states pain in chest when coughing

## 2011-04-18 NOTE — ED Notes (Signed)
C/o fever, cough and congestion

## 2011-04-18 NOTE — ED Provider Notes (Signed)
Patient seen and examined. She has been complaining of cough and congestion. She also has some pleuritic-type chest discomfort with coughing episodes. Patient is feeling better now after albuterol treatment. My exam she still has some mild wheezing on end expiration. She is able to speak in full sentences, and does not demonstrate any distress. Patient will receive another albuterol nebulizer treatment while she's here. She'll be discharged home on a course of prednisone, albuterol inhaler and medications for the pain.  Medical screening examination/treatment/procedure(s) were conducted as a shared visit with non-physician practitioner(s) and myself.  I personally evaluated the patient during the encounter   Celene Kras, MD 04/18/11 2039

## 2011-04-18 NOTE — ED Provider Notes (Deleted)
Medical screening examination/treatment/procedure(s) were performed by non-physician practitioner and as supervising physician I was immediately available for consultation/collaboration.   Analea Muller R Edsel Shives, MD 04/18/11 2338 

## 2011-04-18 NOTE — ED Provider Notes (Signed)
History     CSN: [id number] Arrival date & time: 04/18/2011  2:23 PM   None     Chief Complaint  Patient presents with  . Fever  . Cough    (Consider location/radiation/quality/duration/timing/severity/associated sxs/prior treatment) Patient is a 65 y.o. female presenting with cough. The history is provided by the patient.  Cough This is a new problem. The current episode started more than 2 days ago. The problem occurs constantly. The problem has been gradually worsening. The cough is non-productive. The maximum temperature recorded prior to her arrival was 101 to 101.9 F. Associated symptoms include chills and wheezing. Pertinent negatives include no chest pain, no sweats, no weight loss, no ear congestion, no ear pain, no headaches, no rhinorrhea, no sore throat, no myalgias, no shortness of breath and no eye redness. Her past medical history is significant for COPD.    Pt presents to the ED with complaints of fever and cough since Friday, pt states that the condition had been worsening and her chest hurts when she coughs. She says that she feels like she is getting weak, she been has been coughing and says that her advair has not been working. Pt has also been experiencing diarrhea and lose of appetite.  Past Medical History  Diagnosis Date  . Asthma     uses advair   . Arthritis     left ankle, right knee, right SI joint, wrists  . Varicella as child  . 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 Hypertension   . Hyperlipidemia   . Tuberculosis 2009    h/o positive PPD- seen at health department, had  normal chest x-ray. she was not treated for a  new conversion.    Past Surgical History  Procedure Date  . Excision of pelvic absess, right ovary     2008  . Appendectomy     2008  . Cesarean section     1972    Family History  Problem Relation Age of Onset  . Heart disease Mother     MI - fatal  . Hypertension Mother   . Stroke Father 78    fatal  .  Alzheimer's disease Father   . Alzheimer's disease Brother   . Hyperlipidemia Brother   . Hypertension Brother   . Diabetes Brother   . Hypertension Brother   . Hyperlipidemia Brother     History  Substance Use Topics  . Smoking status: Current Everyday Smoker -- 0.5 packs/day for 40 years    Types: Cigarettes  . Smokeless tobacco: Never Used  . Alcohol Use: Yes     rare occasion    OB History    Grav Para Term Preterm Abortions TAB SAB Ect Mult Living                  Review of Systems  Constitutional: Positive for chills. Negative for weight loss.  HENT: Negative for ear pain, sore throat and rhinorrhea.   Eyes: Negative for redness.  Respiratory: Positive for cough and wheezing. Negative for shortness of breath.   Cardiovascular: Negative for chest pain.  Musculoskeletal: Negative for myalgias.  Neurological: Negative for headaches.  All other systems reviewed and are negative.    Allergies  Codeine  Home Medications   Current Outpatient Rx  Name Route Sig Dispense Refill  . AMLODIPINE BESYLATE 10 MG PO TABS Oral Take 10 mg by mouth daily.      . ASPIRIN 81 MG  PO TABS Oral Take 81 mg by mouth daily.      [name] VITAMIN D 1000 UNITS PO TABS Oral Take 50,000 Units by mouth daily.      [name] FLAX SEEDS PO Oral Take 1 tablet by mouth daily.      [name] FLUTICASONE-SALMETEROL 250-50 MCG/DOSE IN AEPB Inhalation Inhale 1 puff into the lungs every 12 (twelve) hours.      [name] LISINOPRIL-HYDROCHLOROTHIAZIDE 20-12.5 MG PO TABS Oral Take 1 tablet by mouth daily.      [name] LOVASTATIN 20 MG PO TABS Oral Take 20 mg by mouth daily.     [name] OVER THE COUNTER MEDICATION Oral Take 2 tablets by mouth daily. Calcium,magnesium, and zinc combination       BP 130/86  Pulse 86  Temp(Src) 98.6 F (37 C) (Oral)  Resp 18  SpO2 93%  Physical Exam  Nursing note and vitals reviewed. Constitutional: She appears well-developed and well-nourished.  HENT:  Head: Normocephalic and atraumatic.  Eyes:  Conjunctivae are normal. Pupils are equal, round, and reactive to light.  Neck: Trachea normal, normal range of motion and full passive range of motion without pain. Neck supple.  Cardiovascular: Normal rate, regular rhythm and normal pulses.   Pulmonary/Chest: Effort normal. She has wheezes. Chest wall is not dull to percussion. She exhibits no tenderness, no crepitus, no edema, no deformity and no retraction.  Abdominal: Soft. Normal appearance and bowel sounds are normal.  Musculoskeletal: Normal range of motion.  Neurological: She is alert. She has normal strength.  Skin: Skin is warm, dry and intact.  Psychiatric: She has a normal mood and affect. Her speech is normal and behavior is normal. Judgment and thought content normal. Cognition and memory are normal.    ED Course  Procedures (including critical care time)  Labs Reviewed - No data to display Dg Chest 2 View  04/18/2011  *RADIOLOGY REPORT*  Clinical Data: Cough, shortness of breath.  CHEST - 2 VIEW  Comparison: 03/20/2007  Findings: Lingular scarring or atelectasis.  Mild hyperinflation of the lungs compatible with COPD.  Heart is normal size.  No effusions or acute bony abnormality.  IMPRESSION: COPD.  Lingular scarring or atelectasis.  Original Report Authenticated By: Cyndie Chime, M.D.     No diagnosis found.    MDM  Pt feeling much better after albuterol treatment. O2 sat is 100%. Will give another treatment then D/C with prednisone, albuterol and pain medication.        Dorthula Matas, PA 04/18/11 2039

## 2011-04-26 ENCOUNTER — Encounter: Payer: Self-pay | Admitting: *Deleted

## 2011-04-26 ENCOUNTER — Ambulatory Visit (INDEPENDENT_AMBULATORY_CARE_PROVIDER_SITE_OTHER): Payer: Medicare PPO | Admitting: Internal Medicine

## 2011-04-26 ENCOUNTER — Encounter: Payer: Self-pay | Admitting: Internal Medicine

## 2011-04-26 VITALS — BP 132/78 | HR 85 | Temp 98.8°F | Wt 196.4 lb

## 2011-04-26 DIAGNOSIS — J441 Chronic obstructive pulmonary disease with (acute) exacerbation: Secondary | ICD-10-CM

## 2011-04-26 DIAGNOSIS — J40 Bronchitis, not specified as acute or chronic: Secondary | ICD-10-CM

## 2011-04-26 DIAGNOSIS — F17200 Nicotine dependence, unspecified, uncomplicated: Secondary | ICD-10-CM

## 2011-04-26 MED ORDER — BENZONATATE 100 MG PO CAPS: 100.0000 mg | ORAL_CAPSULE | Freq: Three times a day (TID) | ORAL | Status: DC | PRN

## 2011-04-26 MED ORDER — AMOXICILLIN-POT CLAVULANATE 875-125 MG PO TABS: 1.0000 | ORAL_TABLET | Freq: Two times a day (BID) | ORAL | Status: AC

## 2011-04-26 NOTE — Progress Notes (Signed)
  Subjective:    Patient ID: Victoria [name], female    DOB: 1946/01/10, 65 y.o.   MRN: [id number]  HPI  complains of cough Ongoing 2 weeks Seen ER 12/2 for same> cxr without pna, +copd> tx pred pak, hydrocodone tabs Denies fever; improved wheezing and shortness of breath associated with yellow sputum and fatigue Has quit smoking in past 2 weeks because of these symptoms Requests work excuse note for time out  Past Medical History  Diagnosis Date  . Asthma     uses advair   . Arthritis     left ankle, right knee, right SI joint, wrists  . Varicella as child  . 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 Hypertension   . Hyperlipidemia   . Tuberculosis 2009    h/o positive PPD- seen at health department, had  normal chest x-ray. she was not treated for a  new conversion.  [name] COPD (chronic obstructive pulmonary disease)     Review of Systems  Constitutional: Negative for fever and unexpected weight change.  HENT: Negative for congestion, sore throat and sinus pressure.   Respiratory: Negative for shortness of breath and wheezing.   Cardiovascular: Negative for chest pain and leg swelling.       Objective:   Physical Exam BP 132/78  Pulse 85  Temp(Src) 98.8 F (37.1 C) (Oral)  Wt 196 lb 6.4 oz (89.086 kg)  SpO2 95% Wt Readings from Last 3 Encounters:  04/26/11 196 lb 6.4 oz (89.086 kg)  01/21/11 194 lb (87.998 kg)   Constitutional: She appears well-developed and well-nourished. No distress.  Neck: Normal range of motion. Neck supple. No JVD present. No thyromegaly present.  Cardiovascular: Normal rate, regular rhythm and normal heart sounds.  No murmur heard. No BLE edema. Pulmonary/Chest: Effort normal and breath sounds with scattered rhonchi. No respiratory distress. She has no wheezes.  Psychiatric: She has a normal mood and affect. Her behavior is normal. Judgment and thought content normal.   Lab Results  Component Value Date   WBC 6.7 04/18/2011   HGB 14.6  04/18/2011   HCT 43.9 04/18/2011   PLT 197 04/18/2011   GLUCOSE 85 04/18/2011   ALT 8 04/18/2011   AST 36 04/18/2011   NA 134* 04/18/2011   K 3.7 04/18/2011   CL 96 04/18/2011   CREATININE 0.85 04/18/2011   BUN 15 04/18/2011   CO2 26 04/18/2011   INR 1.0 11/28/2006       Assessment & Plan:  COPD exac - recent ER visit and CXR reviewed 04/18/2011 improved bronchospasm s/p pred pak> no need to repeat same tx with empiric antibiotics for associated bronchitis, non-narcotic antitussive therapy Continue rescue inhaler as needed and maintenance Advair Congratulated on smoking cessation, encouraged to remain off cigarettes even after breathing symptoms have returned to baseline Work note provided as requested

## 2011-04-26 NOTE — Patient Instructions (Addendum)
It was good to see you today. Augmentin antibiotics twice a day for one week and Tessalon as needed for cough, especially nighttime.  Your prescription(s) have been submitted to your pharmacy. Please take as directed and contact our office if you believe you are having problem(s) with the medication(s). Work note provided for this week as requested Congratulations on being done with cigarettes. Keep up the good work, and do not resume smoking even after your breathing is back to normal! Other medications reviewed - no other changes at this time

## 2011-06-04 ENCOUNTER — Telehealth: Payer: Self-pay | Admitting: *Deleted

## 2011-06-04 MED ORDER — ALBUTEROL SULFATE HFA 108 (90 BASE) MCG/ACT IN AERS: 1.0000 | INHALATION_SPRAY | Freq: Four times a day (QID) | RESPIRATORY_TRACT | Status: DC | PRN

## 2011-06-04 NOTE — Telephone Encounter (Signed)
Refill request albuterol.

## 2011-06-08 ENCOUNTER — Encounter: Payer: Self-pay | Admitting: Internal Medicine

## 2011-06-10 ENCOUNTER — Ambulatory Visit (INDEPENDENT_AMBULATORY_CARE_PROVIDER_SITE_OTHER)
Admission: RE | Admit: 2011-06-10 | Discharge: 2011-06-10 | Disposition: A | Discharge status: Home / Self Care | Payer: Medicare PPO | Source: Ambulatory Visit | Attending: Internal Medicine | Admitting: Internal Medicine

## 2011-06-10 ENCOUNTER — Encounter: Payer: Self-pay | Admitting: Internal Medicine

## 2011-06-10 ENCOUNTER — Ambulatory Visit (INDEPENDENT_AMBULATORY_CARE_PROVIDER_SITE_OTHER): Payer: Medicare PPO | Admitting: Internal Medicine

## 2011-06-10 DIAGNOSIS — I10 Essential (primary) hypertension: Secondary | ICD-10-CM

## 2011-06-10 DIAGNOSIS — E785 Hyperlipidemia, unspecified: Secondary | ICD-10-CM

## 2011-06-10 DIAGNOSIS — M129 Arthropathy, unspecified: Secondary | ICD-10-CM

## 2011-06-10 DIAGNOSIS — M1990 Unspecified osteoarthritis, unspecified site: Secondary | ICD-10-CM

## 2011-06-10 DIAGNOSIS — R062 Wheezing: Secondary | ICD-10-CM

## 2011-06-10 NOTE — Progress Notes (Signed)
Subjective:    Patient ID: Victoria [name], female    DOB: Oct 31, 1945, 66 y.o.   MRN: [id number]  HPI Mrs. Matherly was seen Dec 10th ,'12 by Dr. Felicity Coyer for acute bronchitis treated with augmentin. She had been to the ED Dec 2nd and was treated with predpak. Her acute symptoms have cleared but she continues to be short of breath. She does have wheezing with exertion. No record of PFTs in EMR. She has stopped smoking in November. She does use the Advair 50/250.  She also c/o chronic right knee pain and now left as well. She has been taking glucosamine. She was previously given clinoril for pain. No knee films for 10 years. She also has ankle pain.   Past Medical History  Diagnosis Date  . Asthma     uses advair   . Arthritis     left ankle, right knee, right SI joint, wrists  . Varicella as child  . 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 Hypertension   . Hyperlipidemia   . Tuberculosis 2009    h/o positive PPD- seen at health department, had  normal chest x-ray. she was not treated for a  new conversion.  [name] COPD (chronic obstructive pulmonary disease)    Past Surgical History  Procedure Date  . Excision of pelvic absess, right ovary     2008  . Appendectomy     2008  . Cesarean section     1972   Family History  Problem Relation Age of Onset  . Heart disease Mother     MI - fatal  . Hypertension Mother   . Stroke Father 75    fatal  . Alzheimer's disease Father   . Alzheimer's disease Brother   . Hyperlipidemia Brother   . Hypertension Brother   . Diabetes Brother   . Hypertension Brother   . Hyperlipidemia Brother    History   Social History  . Marital Status: Divorced    Spouse Name: N/A    Number of Children: 1  . Years of Education: 16   Occupational History  . 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 Smoking status: Former Smoker -- 0.5 packs/day for 40 years    Types: Cigarettes    Quit date: 04/16/2011  . Smokeless tobacco:  Never Used  . Alcohol Use: Yes     rare occasion  . Drug Use: No  . Sexually Active: No   Other Topics Concern  . 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 . Married '69 - 9 yrs/divorced. 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 No h/o physical or sexual abuse. ACP - no living will - wants information. Provided packet of information.       Review of Systems System review is negative for any constitutional, cardiac, pulmonary, GI or neuro symptoms or complaints other than as described in the HPI.     Objective:   Physical Exam Filed Vitals:   06/10/11 0958  BP: 150/70  Pulse: 78  Temp: 98.5 F (36.9 C)   Overweight AA woman in no distress HEENT- nl Pulm - decreased BS, no rales and no wheezing, no increased work of breathing Cor - 2+ radial pulse, RRR Ext - crepitus in the right knee, left knee w/o, no effusion, no deformity  *RADIOLOGY REPORT*  Clinical Data: Pain, arthritis  LEFT  KNEE - COMPLETE 4+ VIEW  Comparison: None.  Findings: There is osteophytosis about the knee. No gross  suprapatellar effusion is identified. No significant joint  compartment narrowing is appreciated. There are no subchondral  lesions. No fracture or dislocation.  IMPRESSION:  There is osteophytosis.  If there is clinical concern regarding meniscal or ligamentous  injury, MRI may be of help.  Original Report Authenticated By: Brandon Melnick, M.D.  *RADIOLOGY REPORT*  Clinical Data: Arthritis  RIGHT KNEE - COMPLETE 4+ VIEW  Comparison: None.  Findings: There is osteophytosis about the knee. A small  suprapatellar effusion is present. No significant joint  compartment narrowing is appreciated. There are no subchondral  lesions. No evidence of fracture or dislocation.  IMPRESSION:  There is osteophytosis.  If there is clinical concern regarding a meniscal or ligamentous    injury, MRI may be of help.  Original Report Authenticated By: Brandon Melnick, M.D.        Assessment & Plan:

## 2011-06-10 NOTE — Patient Instructions (Signed)
Respiratory function - lungs without wheezing today but shallow breath sounds. Plan - will schedule full pulmonary function tests. Do not use the Advair trhe night before or morning of the test. GOOD WORK on the smoking cessation. OK to use nicotine free electronic cigarette.  Knee pain - there is a grind in the right knee. Plan - will check x-rays of both knees today. For pain ok to continue the glucosamine. For acute pain motrin or aleve are fine.

## 2011-06-11 NOTE — Assessment & Plan Note (Signed)
Patient is using Advair. No PFT's on chart  Plan - PFTs pre and post bronchodilators.

## 2011-06-11 NOTE — Assessment & Plan Note (Signed)
BP Readings from Last 3 Encounters:  06/10/11 150/70  04/26/11 132/78  04/18/11 143/67   Fair control - will continue to monitor

## 2011-06-11 NOTE — Assessment & Plan Note (Signed)
Knee pain most likely OA.   Plan- glucosamine          NSAIDs of choice          For click or lock will need ortho consult

## 2011-06-16 ENCOUNTER — Inpatient Hospital Stay (HOSPITAL_COMMUNITY): Admission: RE | Admit: 2011-06-16 | Payer: Medicare PPO | Source: Ambulatory Visit

## 2011-06-22 ENCOUNTER — Other Ambulatory Visit: Payer: Self-pay | Admitting: *Deleted

## 2011-06-22 DIAGNOSIS — R062 Wheezing: Secondary | ICD-10-CM

## 2011-07-07 ENCOUNTER — Ambulatory Visit (INDEPENDENT_AMBULATORY_CARE_PROVIDER_SITE_OTHER): Payer: Medicare PPO | Admitting: Internal Medicine

## 2011-07-07 DIAGNOSIS — R062 Wheezing: Secondary | ICD-10-CM

## 2011-07-07 LAB — PULMONARY FUNCTION TEST

## 2011-07-07 NOTE — Progress Notes (Signed)
PFT done today. 

## 2011-07-21 ENCOUNTER — Telehealth: Payer: Self-pay | Admitting: *Deleted

## 2011-07-21 DIAGNOSIS — R942 Abnormal results of pulmonary function studies: Secondary | ICD-10-CM

## 2011-07-21 NOTE — Telephone Encounter (Signed)
Request results of PFT.

## 2011-07-21 NOTE — Telephone Encounter (Signed)
Study does not have pulmonologist interpretation. Appears to be obstructive disease along with moderate emphysema. Can refer to pulmonary for more detailed evaluation and interpretation.  Please contact pulmonary about a final pulmonologist interpretation of study.

## 2011-07-22 NOTE — Telephone Encounter (Signed)
LMOM [2nd] after receiving msge on my VM for call back.

## 2011-07-22 NOTE — Telephone Encounter (Signed)
Patient informed; referral for pulmonary placed in Epic.

## 2011-07-22 NOTE — Telephone Encounter (Signed)
Left mess for patient to call back.  

## 2011-08-05 ENCOUNTER — Ambulatory Visit (INDEPENDENT_AMBULATORY_CARE_PROVIDER_SITE_OTHER): Payer: Medicare PPO | Admitting: Internal Medicine

## 2011-08-05 ENCOUNTER — Encounter: Payer: Self-pay | Admitting: Internal Medicine

## 2011-08-05 ENCOUNTER — Ambulatory Visit (INDEPENDENT_AMBULATORY_CARE_PROVIDER_SITE_OTHER)
Admission: RE | Admit: 2011-08-05 | Discharge: 2011-08-05 | Disposition: A | Discharge status: Home / Self Care | Payer: Medicare PPO | Source: Ambulatory Visit | Attending: Internal Medicine | Admitting: Internal Medicine

## 2011-08-05 VITALS — BP 158/68 | HR 90 | Temp 98.0°F | Ht 65.5 in | Wt 211.2 lb

## 2011-08-05 DIAGNOSIS — J189 Pneumonia, unspecified organism: Secondary | ICD-10-CM

## 2011-08-05 DIAGNOSIS — E669 Obesity, unspecified: Secondary | ICD-10-CM

## 2011-08-05 DIAGNOSIS — Z8701 Personal history of pneumonia (recurrent): Secondary | ICD-10-CM

## 2011-08-05 DIAGNOSIS — J449 Chronic obstructive pulmonary disease, unspecified: Secondary | ICD-10-CM | POA: Insufficient documentation

## 2011-08-05 MED ORDER — TIOTROPIUM BROMIDE MONOHYDRATE 18 MCG IN CAPS: 18.0000 ug | ORAL_CAPSULE | Freq: Every day | RESPIRATORY_TRACT | Status: DC

## 2011-08-05 NOTE — Progress Notes (Signed)
Subjective:    Patient ID: Victoria [name], female    DOB: 11/24/1945, 66 y.o.   MRN: [id number]  HPI PCP is Illene Regulus, MD, MD   IOV 08/05/2011 66 year old   reports that she quit smoking about 3 months ago. Her smoking use included Cigarettes. She has a 50 pack-year smoking history. Body mass index is 34.61 kg/(m^2).   Diagnosed as asthma 6 years ago when developed dyspnea on exertion. Maintained on advair. Class 2-3 dyspnea but dificult to sort out due to DJD knees but appears if she changes clothes fast will have dyspnea. Relieved by rest. STable since onset. Moderate intensity. Denies cough, wheeze, chest pain, hemoptysis. In dec 2012 had ER visit for"pneumonia and low oxygen" treated with antibiotics. Now baseline per report.Noted she is on ace inhibitors and there is family hx of copd (grandmom)s.   LABS CXR 04/18/11  -copd, lingular atelectasis  PFT 06/19/11 (done on advair)  - fev1 0.97L/43%, Ratio 43, TLC 5L/104%, DLCO 12.3/51%, No BD response   Past Medical History  Diagnosis Date  . Asthma     uses advair   . Arthritis     left ankle, right knee, right SI joint, wrists  . Varicella as child  . 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 Hypertension   . Hyperlipidemia   . Tuberculosis 2009    h/o positive PPD- [facility], had  normal chest x-ray. she was not treated for a  new conversion.  [name] COPD (chronic obstructive pulmonary disease)      Family History  Problem Relation Age of Onset  . Heart disease Mother     MI - fatal  . Hypertension Mother   . Stroke Father 69    fatal  . Alzheimer's disease Father   . Alzheimer's disease Brother   . Hyperlipidemia Brother   . Hypertension Brother   . Diabetes Brother   . Hypertension Brother   . Hyperlipidemia Brother      History   Social History  . Marital Status: Divorced    Spouse Name: N/A    Number of Children: 1  . Years of Education: 16   Occupational History  . 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 Smoking status: Former Smoker -- 0.5 packs/day for 40 years    Types: Cigarettes    Quit date: 04/16/2011  . Smokeless tobacco: Never Used  . Alcohol Use: Yes     rare occasion  . Drug Use: No  . Sexually Active: No   Other Topics Concern  . 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 . Married '69 - 9 yrs/divorced. 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 No h/o physical or sexual abuse. ACP - no living will - wants information. Provided packet of information. On 08/05/2011: she stated she was distant cousins to celebrities Cleda Clarks and Felicie Morn     Allergies  Allergen Reactions  . Codeine Other (See Comments)    jittery     Outpatient Prescriptions Prior to Visit  Medication Sig Dispense Refill  . albuterol (PROVENTIL HFA;VENTOLIN HFA) 108 (90 BASE) MCG/ACT inhaler Inhale 1-2 puffs into the lungs every 6 (six) hours as needed for wheezing.  1 Inhaler  0  . amLODipine (NORVASC) 10 MG tablet Take 10 mg by mouth daily.        [name]  aspirin 81 MG tablet Take 81 mg by mouth daily.        . cholecalciferol (VITAMIN D) 1000 UNITS tablet Take 50,000 Units by mouth daily.        . Flaxseed, Linseed, (FLAX SEEDS PO) Take 1 tablet by mouth daily.        . Fluticasone-Salmeterol (ADVAIR) 250-50 MCG/DOSE AEPB Inhale 1 puff into the lungs every 12 (twelve) hours.        [name] lisinopril-hydrochlorothiazide (PRINZIDE,ZESTORETIC) 20-12.5 MG per tablet Take 1 tablet by mouth daily.        [name] lovastatin (MEVACOR) 20 MG tablet Take 20 mg by mouth daily.       [name] OVER THE COUNTER MEDICATION Take 2 tablets by mouth daily. Calcium,magnesium, and zinc combination       . benzonatate (TESSALON PERLES) 100 MG capsule Take 1 capsule (100 mg total) by mouth 3 (three) times daily as needed for cough.  30 capsule  0       Review of Systems    Constitutional: Negative for fever and unexpected weight change.  HENT: Negative for ear pain, nosebleeds, congestion, sore throat, rhinorrhea, sneezing, trouble swallowing, dental problem, postnasal drip and sinus pressure.   Eyes: Negative for redness and itching.  Respiratory: Positive for shortness of breath. Negative for cough, chest tightness and wheezing.   Cardiovascular: Negative for palpitations and leg swelling.  Gastrointestinal: Negative for nausea and vomiting.  Genitourinary: Negative for dysuria.  Musculoskeletal: Negative for joint swelling.  Skin: Negative for rash.  Neurological: Negative for headaches.  Hematological: Does not bruise/bleed easily.  Psychiatric/Behavioral: Negative for dysphoric mood. The patient is not nervous/anxious.        Objective:   Physical Exam  Vitals reviewed. Constitutional: She is oriented to person, place, and time. She appears well-developed and well-nourished. No distress.       Body mass index is 34.61 kg/(m^2).   HENT:  Head: Normocephalic and atraumatic.  Right Ear: External ear normal.  Left Ear: External ear normal.  Mouth/Throat: Oropharynx is clear and moist. No oropharyngeal exudate.  Eyes: Conjunctivae and EOM are normal. Pupils are equal, round, and reactive to light. Right eye exhibits no discharge. Left eye exhibits no discharge. No scleral icterus.  Neck: Normal range of motion. Neck supple. No JVD present. No tracheal deviation present. No thyromegaly present.  Cardiovascular: Normal rate, regular rhythm, normal heart sounds and intact distal pulses.  Exam reveals no gallop and no friction rub.   No murmur heard. Pulmonary/Chest: Effort normal and breath sounds normal. No respiratory distress. She has no wheezes. She has no rales. She exhibits no tenderness.  Abdominal: Soft. Bowel sounds are normal. She exhibits no distension and no mass. There is no tenderness. There is no rebound and no guarding.  Musculoskeletal:  Normal range of motion. She exhibits no edema and no tenderness.       djd knees +  Lymphadenopathy:    She has no cervical adenopathy.  Neurological: She is alert and oriented to person, place, and time. She has normal reflexes. No cranial nerve deficit. She exhibits normal muscle tone. Coordination normal.  Skin: Skin is warm and dry. No rash noted. She is not diaphoretic. No erythema. No pallor.  Psychiatric: She has a normal mood and affect. Her behavior is normal. Judgment and thought content normal.          Assessment & Plan:

## 2011-08-05 NOTE — Assessment & Plan Note (Signed)
#  ASthma/copd   - I think you have copd due to prior smoking; your lung function in feb 2013 was 43% normal capacity  - Continue advair as before  - Please start spiriva 1 puff daily - take sample, script and show technique #FOllowup  - 3-4 weeks - at followup repeat spirometry and alpha 1 test and walk te

## 2011-08-05 NOTE — Patient Instructions (Signed)
#  Weight control  - take [facility] lipid sheet from my nurse  - will discuss more at followup #ASthma/copd   - I think you have copd due to prior smoking; your lung function in feb 2013 was 43% normal capacity  - Continue advair as before  - Please start spiriva 1 puff daily - take sample, script and show technique #FOllowup  - 3-4 weeks - at followup repeat spirometry and alpha 1 test and walk test

## 2011-08-05 NOTE — Assessment & Plan Note (Signed)
Gives hx of pneumonia er vsiit in dec 2012 with cxr showing lingula atelectasis. Will get repeat cxr

## 2011-08-05 NOTE — Assessment & Plan Note (Signed)
We briefly discussed low glycemic diets. I will have a more detailed discussion at followup

## 2011-09-15 ENCOUNTER — Ambulatory Visit (INDEPENDENT_AMBULATORY_CARE_PROVIDER_SITE_OTHER): Payer: Medicare PPO | Admitting: Internal Medicine

## 2011-09-15 ENCOUNTER — Encounter: Payer: Self-pay | Admitting: Internal Medicine

## 2011-09-15 VITALS — BP 120/76 | HR 92 | Temp 98.4°F | Ht 65.5 in | Wt 206.0 lb

## 2011-09-15 DIAGNOSIS — E669 Obesity, unspecified: Secondary | ICD-10-CM

## 2011-09-15 DIAGNOSIS — J449 Chronic obstructive pulmonary disease, unspecified: Secondary | ICD-10-CM

## 2011-09-15 NOTE — Patient Instructions (Signed)
#  Weight control  - take [facility] lipid sheet from my nurse AGAIN   - glad your weight is down  - On the diet sheet:   - foods in left column - you eat only those daily.   - Foods in the middle column - eat those once a month  - Foods in the right column - eat those only 4-6 times a year on special occassions - Next you will need exercise; to start this refer pulmonary rehab  #ASthma/copd   - Glad you are better   -Continue advair  An spiriva - Nurse will refer you to pulmonary rehab  #FOllowup  - 3 months - at followup repeat spirometry and alpha 1 test and CAT score - come sooner if there are problems

## 2011-09-15 NOTE — Assessment & Plan Note (Signed)
#  ASthma/copd   - Glad you are better   -Continue advair  An spiriva - Nurse will refer you to pulmonary rehab  #FOllowup  - 3 months - at followup repeat spirometry and alpha 1 test and CAT score - come sooner if there are problems

## 2011-09-15 NOTE — Progress Notes (Signed)
Subjective:    Patient ID: Victoria [name], female    DOB: May 29, 1945, 66 y.o.   MRN: [id number]  HPI IOV 08/05/2011 66 year old   reports that she quit smoking about 3 months ago. Her smoking use included Cigarettes. She has a 50 pack-year smoking history. Body mass index is 34.61 kg/(m^2).   Diagnosed as asthma 6 years ago when developed dyspnea on exertion. Maintained on advair. Class 2-3 dyspnea but dificult to sort out due to DJD knees but appears if she changes clothes fast will have dyspnea. Relieved by rest. STable since onset. Moderate intensity. Denies cough, wheeze, chest pain, hemoptysis. In dec 2012 had ER visit for"pneumonia and low oxygen" treated with antibiotics. Now baseline per report.Noted she is on ace inhibitors and there is family hx of copd (grandmom)s.   LABS CXR 04/18/11  -copd, lingular atelectasis  PFT 06/19/11 (done on advair)  - fev1 0.97L/43%, Ratio 43, TLC 5L/104%, DLCO 12.3/51%, No BD response   #Weight control  - take [facility] lipid sheet from my nurse  - will discuss more at followup  #ASthma/copd  - I think you have copd due to prior smoking; your lung function in feb 2013 was 43% normal capacity  - Continue advair as before  - Please start spiriva 1 puff daily - take sample, script and show technique  #FOllowup  - 3-4 weeks  - at followup repeat spirometry and alpha 1 test and walk test   OV 09/15/2011  Followup obesity and gold stage 3 copd.   - Reports doing really well with spiriva. Les dyspneic. Walk test - 93% lowest after 185 feet x 3 laps. Doing well.  Symptom CAT score is 7  - Cough level 1, mucus level 1, chest tightness 0, dyspnea walking hill 2, activity limitation 1, confidence - 0, sleep quality - 1, and energy level - 1.  She is interested in joining rehab   - In terms of obesity, Lost 6# on [facility] lipid diet - wants me to go over it again with her. Happy with diet   Past, Family, Social reviewed: no change since last visit     Review of  Systems  Constitutional: Negative for fever and unexpected weight change.  HENT: Negative for ear pain, nosebleeds, congestion, sore throat, rhinorrhea, sneezing, trouble swallowing, dental problem, postnasal drip and sinus pressure.   Eyes: Negative for redness and itching.  Respiratory: Negative for cough, chest tightness, shortness of breath and wheezing.   Cardiovascular: Negative for palpitations and leg swelling.  Gastrointestinal: Negative for nausea and vomiting.  Genitourinary: Negative for dysuria.  Musculoskeletal: Negative for joint swelling.  Skin: Negative for rash.  Neurological: Negative for headaches.  Hematological: Does not bruise/bleed easily.  Psychiatric/Behavioral: Negative for dysphoric mood. The patient is not nervous/anxious.        Objective:   Physical Exam Vitals reviewed. Constitutional: She is oriented to person, place, and time. She appears well-developed and well-nourished. No distress.       Body mass index is 34.61 kg/(m^2). 08/05/11 Body mass index is 33.76 kg/(m^2). - 09/15/2011     HENT:  Head: Normocephalic and atraumatic.  Right Ear: External ear normal.  Left Ear: External ear normal.  Mouth/Throat: Oropharynx is clear and moist. No oropharyngeal exudate.  Eyes: Conjunctivae and EOM are normal. Pupils are equal, round, and reactive to light. Right eye exhibits no discharge. Left eye exhibits no discharge. No scleral icterus.  Neck: Normal range of motion. Neck supple. No JVD present.  No tracheal deviation present. No thyromegaly present.  Cardiovascular: Normal rate, regular rhythm, normal heart sounds and intact distal pulses.  Exam reveals no gallop and no friction rub.   No murmur heard. Pulmonary/Chest: Effort normal and breath sounds normal. No respiratory distress. She has no wheezes. She has no rales. She exhibits no tenderness.  Abdominal: Soft. Bowel sounds are normal. She exhibits no distension and no mass. There is no tenderness.  There is no rebound and no guarding.  Musculoskeletal: Normal range of motion. She exhibits no edema and no tenderness.       djd knees +  Lymphadenopathy:    She has no cervical adenopathy.  Neurological: She is alert and oriented to person, place, and time. She has normal reflexes. No cranial nerve deficit. She exhibits normal muscle tone. Coordination normal.  Skin: Skin is warm and dry. No rash noted. She is not diaphoretic. No erythema. No pallor.  Psychiatric: She has a normal mood and affect. Her behavior is normal. Judgment and thought content normal.          Assessment & Plan:

## 2011-09-15 NOTE — Assessment & Plan Note (Signed)
#  Weight control  - take [facility] lipid sheet from my nurse AGAIN   - glad your weight is down  - On the diet sheet:   - foods in left column - you eat only those daily.   - Foods in the middle column - eat those once a month  - Foods in the right column - eat those only 4-6 times a year on special occassions - Next you will need exercise; to start this refer pulmonary rehab

## 2011-10-21 ENCOUNTER — Telehealth: Payer: Self-pay | Admitting: Internal Medicine

## 2011-10-21 NOTE — Telephone Encounter (Signed)
LMOMTCB x 1 for pt. Order was placed on May 1st but it can take 5-6 weeks to hear from them regarding an initial appt.

## 2011-10-21 NOTE — Telephone Encounter (Signed)
Returning call.Victoria [name] ° °

## 2011-10-21 NOTE — Telephone Encounter (Signed)
Patient made aware and will wait a few more weeks to hear from pulm rehab. She will let us know if she hears nothing so we may check on this for her.

## 2011-11-23 ENCOUNTER — Ambulatory Visit: Payer: Medicare PPO | Admitting: Internal Medicine

## 2011-11-23 ENCOUNTER — Ambulatory Visit (INDEPENDENT_AMBULATORY_CARE_PROVIDER_SITE_OTHER): Payer: Medicare PPO | Admitting: Internal Medicine

## 2011-11-23 ENCOUNTER — Encounter: Payer: Self-pay | Admitting: Internal Medicine

## 2011-11-23 ENCOUNTER — Telehealth: Payer: Self-pay | Admitting: Internal Medicine

## 2011-11-23 VITALS — BP 152/68 | HR 87 | Temp 98.3°F | Ht 64.5 in | Wt 209.8 lb

## 2011-11-23 DIAGNOSIS — J449 Chronic obstructive pulmonary disease, unspecified: Secondary | ICD-10-CM

## 2011-11-23 DIAGNOSIS — E669 Obesity, unspecified: Secondary | ICD-10-CM

## 2011-11-23 MED ORDER — LOVASTATIN 20 MG PO TABS: 20.0000 mg | ORAL_TABLET | Freq: Every day | ORAL | Status: DC

## 2011-11-23 MED ORDER — AMLODIPINE BESYLATE 10 MG PO TABS: 10.0000 mg | ORAL_TABLET | Freq: Every day | ORAL | Status: DC

## 2011-11-23 NOTE — Patient Instructions (Addendum)
#  Weight control  - we discussed challenges with [facility] low glycemic diet - this is a process, keep trying - gave you tips to control cravings like eating nut pack and low glycemic fruit serving   - On the diet sheet:   - foods in left column - you eat only those daily.   - Foods in the middle column - eat those once a month  - Foods in the right column - eat those only 4-6 times a year on special occassions - Next you will need exercise; to start this refer pulmonary rehab  #ASthma/copd   - Glad you are better   -Continue advair  And spiriva; will ensure e-refills or script refills - Too bad that rehab is expensive, let me know when you join [facility] and I can give you some guide posts  #FOllowup  - 6 months - at followup repeat spirometry  - come sooner if there are problems

## 2011-11-23 NOTE — Telephone Encounter (Signed)
Pt came into clinic requesting refill of medication until upcoming appt.

## 2011-11-23 NOTE — Progress Notes (Signed)
Subjective:    Patient ID: Victoria [name], female    DOB: 1945/07/18, 66 y.o.   MRN: [id number] #COPD/Asthma  - 50 pack smoker, quit Jan 2012 -   CXR 04/18/11  -copd, lingular atelectasis - PFT 06/19/11 (done on advair):  - fev1 0.97L/43%, Ratio 43, TLC 5L/104%, DLCO 12.3/51%, No BD response - CAT score 7 - May 2012 - Rx spiriva and advair - Alpha 1 11/23/2011:    #Obesity  Body mass index is 34.61 kg/(m^2). 08/05/11 Body mass index is 33.76 kg/(m^2). - 09/15/2011 Body mass index is 35.46 kg/(m^2). - 11/23/2011 HPI    OV 11/23/2011  Followup Obesity and COPD/asthma. No issues with breathing. CAT score is 6 today and reflects baseline health. STruggling with low glycemic diet that I advised; says she is cheating. In times of routine break or social stress like 4th July or funeral eating red meat, bread, burgers, and donuts. So, gained all weigh back. No new complaint other than fact rehab will be expensive fo r her    CAT COPD Symptom and Quality of Life Score ([employer])  0 (no burden) to 5 (highest burden)  Never Cough -> Cough all the time 1  No phlegm in chest -> Chest is full of phlegm 0  No chest tightness -> Chest feels very tight 0  No dyspnea for 1 flight stairs/hill -> Very dyspneic for 1 flight of stairs 1  No limitations for ADL at home -> Very limited with ADL at home 1  Confident leaving home -> Not at all confident leaving home 0  Sleep soundly -> Do not sleep soundly because of lung condition 1  Lots of Energy -> No energy at all 2  TOTAL Score (max 40)  6    Past, Family, Social reviewed: no change since last visit   Review of Systems  Constitutional: Negative for fever and unexpected weight change.  HENT: Negative for ear pain, nosebleeds, congestion, sore throat, rhinorrhea, sneezing, trouble swallowing, dental problem, postnasal drip and sinus pressure.   Eyes: Negative for redness and itching.  Respiratory: Negative for cough, chest tightness,  shortness of breath and wheezing.   Cardiovascular: Negative for palpitations and leg swelling.  Gastrointestinal: Negative for nausea and vomiting.  Genitourinary: Negative for dysuria.  Musculoskeletal: Negative for joint swelling.  Skin: Negative for rash.  Neurological: Negative for headaches.  Hematological: Does not bruise/bleed easily.  Psychiatric/Behavioral: Negative for dysphoric mood. The patient is not nervous/anxious.        Objective:   Physical Exam Vitals reviewed. Constitutional: She is oriented to person, place, and time. She appears well-developed and well-nourished. No distress.       Body mass index is 34.61 kg/(m^2). 08/05/11 Body mass index is 33.76 kg/(m^2). - 09/15/2011 Body mass index is 35.46 kg/(m^2). - 11/23/2011   HENT:  Head: Normocephalic and atraumatic.  Right Ear: External ear normal.  Left Ear: External ear normal.  Mouth/Throat: Oropharynx is clear and moist. No oropharyngeal exudate.  Eyes: Conjunctivae and EOM are normal. Pupils are equal, round, and reactive to light. Right eye exhibits no discharge. Left eye exhibits no discharge. No scleral icterus.  Neck: Normal range of motion. Neck supple. No JVD present. No tracheal deviation present. No thyromegaly present.  Cardiovascular: Normal rate, regular rhythm, normal heart sounds and intact distal pulses.  Exam reveals no gallop and no friction rub.   No murmur heard. Pulmonary/Chest: Effort normal and breath sounds normal. No respiratory distress. She has no  wheezes. She has no rales. She exhibits no tenderness.  Abdominal: Soft. Bowel sounds are normal. She exhibits no distension and no mass. There is no tenderness. There is no rebound and no guarding.  Musculoskeletal: Normal range of motion. She exhibits no edema and no tenderness.       djd knees +  Lymphadenopathy:    She has no cervical adenopathy.  Neurological: She is alert and oriented to person, place, and time. She has normal  reflexes. No cranial nerve deficit. She exhibits normal muscle tone. Coordination normal.  Skin: Skin is warm and dry. No rash noted. She is not diaphoretic. No erythema. No pallor.  Psychiatric: She has a normal mood and affect. Her behavior is normal. Judgment and thought content normal.           Assessment & Plan:

## 2011-11-24 NOTE — Assessment & Plan Note (Signed)
#  Weight control  - we discussed challenges with [facility] low glycemic diet - this is a process, keep trying - gave you tips to control cravings like eating nut pack and low glycemic fruit serving   - On the diet sheet:   - foods in left column - you eat only those daily.   - Foods in the middle column - eat those once a month  - Foods in the right column - eat those only 4-6 times a year on special occassions - for exercise, ok to join [facility] [employer] because rehab expensive, at future visit will dicuss how to exercise   > 50% of this 25 min visit spent in face to face counseling (15 min visit converted to 25 min)

## 2011-11-24 NOTE — Assessment & Plan Note (Signed)
#  ASthma/copd   - Glad you are better   -Continue advair  And spiriva; will ensure e-refills or script refills - Too bad that rehab is expensive, let me know when you join [facility] and I can give you some guide posts  #FOllowup  - 6 months - at followup repeat spirometry  - come sooner if there are problem

## 2011-11-25 ENCOUNTER — Other Ambulatory Visit: Payer: Self-pay | Admitting: Internal Medicine

## 2011-12-06 ENCOUNTER — Encounter: Payer: Self-pay | Admitting: Internal Medicine

## 2011-12-06 ENCOUNTER — Other Ambulatory Visit (INDEPENDENT_AMBULATORY_CARE_PROVIDER_SITE_OTHER): Payer: Medicare PPO

## 2011-12-06 ENCOUNTER — Ambulatory Visit (INDEPENDENT_AMBULATORY_CARE_PROVIDER_SITE_OTHER): Payer: Medicare PPO | Admitting: Internal Medicine

## 2011-12-06 VITALS — BP 160/82 | HR 80 | Temp 98.5°F | Resp 16 | Wt 203.0 lb

## 2011-12-06 DIAGNOSIS — M1990 Unspecified osteoarthritis, unspecified site: Secondary | ICD-10-CM

## 2011-12-06 DIAGNOSIS — I10 Essential (primary) hypertension: Secondary | ICD-10-CM

## 2011-12-06 DIAGNOSIS — E66811 Obesity, class 1: Secondary | ICD-10-CM

## 2011-12-06 DIAGNOSIS — Z23 Encounter for immunization: Secondary | ICD-10-CM

## 2011-12-06 DIAGNOSIS — E785 Hyperlipidemia, unspecified: Secondary | ICD-10-CM

## 2011-12-06 DIAGNOSIS — E669 Obesity, unspecified: Secondary | ICD-10-CM

## 2011-12-06 DIAGNOSIS — J449 Chronic obstructive pulmonary disease, unspecified: Secondary | ICD-10-CM

## 2011-12-06 DIAGNOSIS — Z0000 Encounter for general adult medical examination without abnormal findings: Secondary | ICD-10-CM

## 2011-12-06 DIAGNOSIS — M129 Arthropathy, unspecified: Secondary | ICD-10-CM

## 2011-12-06 LAB — LIPID PANEL
HDL: 62 mg/dL (ref >39.00)
Total CHOL/HDL Ratio: 3
Triglycerides: 141 mg/dL (ref 0.0–149.0)

## 2011-12-06 LAB — HEPATIC FUNCTION PANEL
AST: 13 U/L (ref 0–37)
Albumin: 4 g/dL (ref 3.5–5.2)
Total Bilirubin: 0.6 mg/dL (ref 0.3–1.2)

## 2011-12-06 LAB — COMPREHENSIVE METABOLIC PANEL
ALT: 8 U/L (ref 0–35)
CO2: 27 mEq/L (ref 19–32)
Calcium: 9.7 mg/dL (ref 8.4–10.5)
Chloride: 102 mEq/L (ref 96–112)
Creatinine, Ser: 1 mg/dL (ref 0.4–1.2)
GFR: 74.36 mL/min (ref >60.00)
Glucose, Bld: 86 mg/dL (ref 70–99)
Total Bilirubin: 0.6 mg/dL (ref 0.3–1.2)
Total Protein: 7.2 g/dL (ref 6.0–8.3)

## 2011-12-06 MED ORDER — LOVASTATIN 20 MG PO TABS: 20.0000 mg | ORAL_TABLET | Freq: Every day | ORAL | Status: DC

## 2011-12-06 MED ORDER — FLUTICASONE-SALMETEROL 250-50 MCG/DOSE IN AEPB: 1.0000 | INHALATION_SPRAY | Freq: Two times a day (BID) | RESPIRATORY_TRACT | Status: DC

## 2011-12-06 MED ORDER — TIOTROPIUM BROMIDE MONOHYDRATE 18 MCG IN CAPS: 1.0000 ug | ORAL_CAPSULE | Freq: Every day | RESPIRATORY_TRACT | Status: DC

## 2011-12-06 MED ORDER — LISINOPRIL-HYDROCHLOROTHIAZIDE 20-12.5 MG PO TABS: 1.0000 | ORAL_TABLET | Freq: Every day | ORAL | Status: DC

## 2011-12-06 MED ORDER — AMLODIPINE BESYLATE 10 MG PO TABS: 10.0000 mg | ORAL_TABLET | Freq: Every day | ORAL | Status: DC

## 2011-12-06 NOTE — Progress Notes (Signed)
Subjective:    Patient ID: Victoria [name], female    DOB: Nov 26, 1945, 66 y.o.   MRN: [id number]  HPI Mrs. Grauberger presents for follow u of BP. She has been out of lisinopril for several days Generally she tolerates her medications. Chart reviewed: she has had variable BP over the past year. She reports good control at home on all her medications.   She is current with gyn - Cr. McCoomb.   She continues to have some knee pain but she is much better since she changed jobs and isn't on her feet all day. Reviewed X-rays from jan '13 - mild osteophytosis.  She has had a DEXA '13 - osteoporosis in the femoral neck.   Past Medical History  Diagnosis Date  . Asthma     uses advair   . Arthritis     left ankle, right knee, right SI joint, wrists  . Varicella as child  . 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 Hypertension   . Hyperlipidemia   . Tuberculosis 2009    h/o positive PPD- seen at [facility], had  normal chest x-ray. she was not treated for a  new conversion.  [name] COPD (chronic obstructive pulmonary disease)    Past Surgical History  Procedure Date  . Excision of pelvic absess, right ovary     2008  . Appendectomy     2008  . Cesarean section     1972   Family History  Problem Relation Age of Onset  . Heart disease Mother     MI - fatal  . Hypertension Mother   . Stroke Father 76    fatal  . Alzheimer's disease Father   . Alzheimer's disease Brother   . Hyperlipidemia Brother   . Hypertension Brother   . Diabetes Brother   . Hypertension Brother   . Hyperlipidemia Brother    History   Social History  . Marital Status: Divorced    Spouse Name: N/A    Number of Children: 1  . Years of Education: 16   Occupational History  . 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 Smoking status: Former Smoker -- 0.5 packs/day for 40 years    Types: Cigarettes    Quit date: 04/16/2011  . Smokeless tobacco: Never Used  . Alcohol Use: Yes   rare occasion  . Drug Use: No  . Sexually Active: No   Other Topics Concern  . 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 . Married '69 - 9 yrs/divorced. 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 No h/o physical or sexual abuse. ACP - no living will - wants information. Provided packet of information. On 08/05/2011: she stated she was distant cousins to celebrities Cleda Clarks and Felicie Morn    Current Outpatient Prescriptions on File Prior to Visit  Medication Sig Dispense Refill  . albuterol (PROVENTIL HFA;VENTOLIN HFA) 108 (90 BASE) MCG/ACT inhaler Inhale 1-2 puffs into the lungs every 6 (six) hours as needed for wheezing.  1 Inhaler  0  . aspirin 81 MG tablet Take 81 mg by mouth daily.        . cholecalciferol (VITAMIN D) 1000 UNITS tablet Take 50,000 Units by mouth daily.        . Flaxseed, Linseed, (FLAX SEEDS PO) Take 1 tablet by mouth daily.        [name]  glucosamine-chondroitin 500-400 MG tablet Take 1 tablet by mouth 3 (three) times daily.      [name] OVER THE COUNTER MEDICATION Take 2 tablets by mouth daily. Calcium,magnesium, and zinc combination       . DISCONTD: amLODipine (NORVASC) 10 MG tablet Take 1 tablet (10 mg total) by mouth daily.  30 tablet  0  . DISCONTD: Fluticasone-Salmeterol (ADVAIR) 250-50 MCG/DOSE AEPB Inhale 1 puff into the lungs every 12 (twelve) hours.        [name] DISCONTD: lisinopril-hydrochlorothiazide (PRINZIDE,ZESTORETIC) 20-12.5 MG per tablet Take 1 tablet by mouth daily.        [name] DISCONTD: lovastatin (MEVACOR) 20 MG tablet Take 1 tablet (20 mg total) by mouth daily.  30 tablet  0  . DISCONTD: SPIRIVA HANDIHALER 18 MCG inhalation capsule INHALE ONE DOSE BY MOUTH EVERY DAY  30 each  5       Review of Systems System review is negative for any constitutional, cardiac, pulmonary, GI or neuro symptoms or complaints other than as described in the HPI.       Objective:   Physical Exam Filed Vitals:   12/06/11 1508  BP: 160/82  Pulse: 80  Temp: 98.5 F (36.9 C)  Resp: 16   Wt Readings from Last 3 Encounters:  12/06/11 203 lb (92.08 kg)  11/23/11 209 lb 12.8 oz (95.165 kg)  09/15/11 206 lb (93.441 kg)   Gen'l- overweight AA woman in no distress HEENT- C&S clear Neck- supple, no thyromegaly Cor- 2+ radial pulse, RRR w/o murmur. Pulm - good breath sounds, no increased WOB, no rales or wheezing Abd- BS+ ,  Ext- no deformity, swelling at ankles. Neuro - A&O x 3, CN II-XII grossly intact.  Lab Results  Component Value Date   WBC 6.7 04/18/2011   HGB 14.6 04/18/2011   HCT 43.9 04/18/2011   PLT 197 04/18/2011   GLUCOSE 86 12/06/2011   CHOL 186 12/06/2011   TRIG 141.0 12/06/2011   HDL 62.00 12/06/2011   LDLCALC 96 12/06/2011        ALT 8 12/06/2011   AST 13 12/06/2011        NA 137 12/06/2011   K 4.8 12/06/2011   CL 102 12/06/2011   CREATININE 1.0 12/06/2011   BUN 13 12/06/2011   CO2 27 12/06/2011   INR 1.0 11/28/2006         Assessment & Plan:

## 2011-12-07 ENCOUNTER — Encounter: Payer: Self-pay | Admitting: Internal Medicine

## 2011-12-07 DIAGNOSIS — Z0000 Encounter for general adult medical examination without abnormal findings: Secondary | ICD-10-CM | POA: Insufficient documentation

## 2011-12-07 NOTE — Assessment & Plan Note (Signed)
BP Readings from Last 3 Encounters:  12/06/11 160/82  11/23/11 152/68  09/15/11 120/76   Suboptimal control! She has missed several doses of lisinopril, but has taken other meds.  Plan Resume full antihypertensive regimen  Monitor BP at home - call if SBP (top #) is running consistently 140 or higher for adjustment in medication.

## 2011-12-07 NOTE — Assessment & Plan Note (Signed)
OA knees that causes pain but is not limiting activities.  Plan APAP or NSAIDs as needed  Weight management

## 2011-12-07 NOTE — Assessment & Plan Note (Signed)
LDL is better than goal of 130 or less, HDL is better than goal of 40 or higher on her present medication. She has no adverse medication effects and liver functions are normal.  Plan Continue present medical therapy.

## 2011-12-07 NOTE — Assessment & Plan Note (Signed)
Patient with COPD and emphysema doing well with ADvair and Spiriva for maintenance therapy. Rare breakthrough events.  Plan  continue present regimen.

## 2011-12-07 NOTE — Assessment & Plan Note (Signed)
[name] weight is down to 203.  Plan Continued weight management with smart food choices, PORTION SIZE CONTROL and exercise.  Goal is to loose 1-2 lbs per month.

## 2011-12-23 ENCOUNTER — Encounter: Payer: Self-pay | Admitting: Internal Medicine

## 2012-06-09 ENCOUNTER — Other Ambulatory Visit: Payer: Self-pay | Admitting: Internal Medicine

## 2012-06-14 ENCOUNTER — Ambulatory Visit: Payer: Medicare PPO | Admitting: Internal Medicine

## 2012-06-15 ENCOUNTER — Ambulatory Visit: Payer: Medicare PPO | Admitting: Internal Medicine

## 2012-06-23 ENCOUNTER — Ambulatory Visit: Payer: Medicare PPO | Admitting: Internal Medicine

## 2012-07-03 ENCOUNTER — Ambulatory Visit (INDEPENDENT_AMBULATORY_CARE_PROVIDER_SITE_OTHER): Payer: Medicare PPO | Admitting: Internal Medicine

## 2012-07-03 ENCOUNTER — Encounter: Payer: Self-pay | Admitting: Internal Medicine

## 2012-07-03 VITALS — BP 146/76 | HR 81 | Temp 98.3°F | Ht 65.5 in | Wt 219.0 lb

## 2012-07-03 VITALS — BP 138/70 | HR 98 | Temp 98.2°F | Resp 10 | Wt 219.0 lb

## 2012-07-03 DIAGNOSIS — J449 Chronic obstructive pulmonary disease, unspecified: Secondary | ICD-10-CM

## 2012-07-03 DIAGNOSIS — I10 Essential (primary) hypertension: Secondary | ICD-10-CM

## 2012-07-03 DIAGNOSIS — E669 Obesity, unspecified: Secondary | ICD-10-CM

## 2012-07-03 DIAGNOSIS — E785 Hyperlipidemia, unspecified: Secondary | ICD-10-CM

## 2012-07-03 DIAGNOSIS — J4489 Other specified chronic obstructive pulmonary disease: Secondary | ICD-10-CM

## 2012-07-03 MED ORDER — LOVASTATIN 20 MG PO TABS: 20.0000 mg | ORAL_TABLET | Freq: Every day | ORAL | Status: DC

## 2012-07-03 MED ORDER — ALBUTEROL SULFATE HFA 108 (90 BASE) MCG/ACT IN AERS: 1.0000 | INHALATION_SPRAY | Freq: Four times a day (QID) | RESPIRATORY_TRACT | Status: DC | PRN

## 2012-07-03 MED ORDER — FLUTICASONE-SALMETEROL 250-50 MCG/DOSE IN AEPB: 1.0000 | INHALATION_SPRAY | Freq: Two times a day (BID) | RESPIRATORY_TRACT | Status: DC

## 2012-07-03 MED ORDER — AMLODIPINE BESYLATE 10 MG PO TABS: 10.0000 mg | ORAL_TABLET | Freq: Every day | ORAL | Status: DC

## 2012-07-03 MED ORDER — TIOTROPIUM BROMIDE MONOHYDRATE 18 MCG IN CAPS: 1.0000 ug | ORAL_CAPSULE | Freq: Every day | RESPIRATORY_TRACT | Status: DC

## 2012-07-03 MED ORDER — LISINOPRIL-HYDROCHLOROTHIAZIDE 20-12.5 MG PO TABS: 1.0000 | ORAL_TABLET | Freq: Every day | ORAL | Status: DC

## 2012-07-03 NOTE — Progress Notes (Signed)
Subjective:    Patient ID: Victoria [name], female    DOB: [birth year], 67 y.o.   MRN: [id number]  HPI   Patient ID: Victoria [name], female    DOB: [birth year], 67 y.o.   MRN: [id number] #COPD/Asthma  - 50 pack smoker, quit Jan 2012 -   CXR 04/18/11  -copd, lingular atelectasis - PFT 06/19/11 (done on advair):  - fev1 0.97L/43%, Ratio 43, TLC 5L/104%, DLCO 12.3/51%, No BD response - CAT score 7 - May 2012 - Rx spiriva and advair - Alpha 1 11/23/2011:    #Obesity  Body mass index is 34.61 kg/(m^2). 08/05/11 Body mass index is 33.76 kg/(m^2). - 09/15/2011 Body mass index is 35.46 kg/(m^2). - 11/23/2011 HPI    OV 11/23/2011  Followup Obesity and COPD/asthma. No issues with breathing. CAT score is 6 today and reflects baseline health. STruggling with low glycemic diet that I advised; says she is cheating. In times of routine break or social stress like 4th July or funeral eating red meat, bread, burgers, and donuts. So, gained all weigh back. No new complaint other than fact rehab will be expensive fo r her   Past, Family, Social reviewed: no change since last visit    OV 07/03/2012 COPD/asthma. She is doing well in January 2014 she thinks she had the influenza although this was not confirmed but symptoms were consistent with that. She took 2 weeks of work and now she is feeling completely fine. COPD cat score is 14 which is his baseline although it is elevated compared to 6 at prior visit. No new issues  She struggling with weight loss particularly  With  Discipline at end of the day    CAT COPD Symptom and Quality of Life Score ([employer])   11/23/11 07/03/2012   Never Cough -> Cough all the time 1 2  No phlegm in chest -> Chest is full of phlegm 0 1  No chest tightness -> Chest feels very tight 0 2  No dyspnea for 1 flight stairs/hill -> Very dyspneic for 1 flight of stairs 1 3  No limitations for ADL at home -> Very limited with ADL at home 1 2  Confident leaving home  -> Not at all confident leaving home 0 0  Sleep soundly -> Do not sleep soundly because of lung condition 1 1  Lots of Energy -> No energy at all 2 3  TOTAL Score (max 40)  6 14            Review of Systems  Constitutional: Negative for fever and unexpected weight change.  HENT: Positive for postnasal drip. Negative for ear pain, nosebleeds, congestion, sore throat, rhinorrhea, sneezing, trouble swallowing, dental problem and sinus pressure.   Eyes: Negative for redness and itching.  Respiratory: Positive for shortness of breath. Negative for cough, chest tightness and wheezing.   Cardiovascular: Negative for palpitations and leg swelling.  Gastrointestinal: Negative for nausea and vomiting.  Genitourinary: Negative for dysuria.  Musculoskeletal: Negative for joint swelling.  Skin: Negative for rash.  Neurological: Negative for headaches.  Hematological: Does not bruise/bleed easily.  Psychiatric/Behavioral: Negative for dysphoric mood. The patient is not nervous/anxious.        Objective:   Physical Exam  Objective:   Physical Exam Vitals reviewed. Constitutional: She is oriented to person, place, and time. She appears well-developed and well-nourished. No distress.       Body mass index is 34.61 kg/(m^2). 08/05/11 Body mass index is 33.76 kg/(m^2). -  09/15/2011 Body mass index is 35.46 kg/(m^2). - 11/23/2011 Body mass index is 35.88 kg/(m^2). on 07/03/2012     HENT:  Head: Normocephalic and atraumatic.  Right Ear: External ear normal.  Left Ear: External ear normal.  Mouth/Throat: Oropharynx is clear and moist. No oropharyngeal exudate.  Eyes: Conjunctivae and EOM are normal. Pupils are equal, round, and reactive to light. Right eye exhibits no discharge. Left eye exhibits no discharge. No scleral icterus.  Neck: Normal range of motion. Neck supple. No JVD present. No tracheal deviation present. No thyromegaly present.  Cardiovascular: Normal rate, regular rhythm,  normal heart sounds and intact distal pulses.  Exam reveals no gallop and no friction rub.   No murmur heard. Pulmonary/Chest: Effort normal and breath sounds normal. No respiratory distress. She has no wheezes. She has no rales. She exhibits no tenderness.  Abdominal: Soft. Bowel sounds are normal. She exhibits no distension and no mass. There is no tenderness. There is no rebound and no guarding.  Musculoskeletal: Normal range of motion. She exhibits no edema and no tenderness.       djd knees +  Lymphadenopathy:    She has no cervical adenopathy.  Neurological: She is alert and oriented to person, place, and time. She has normal reflexes. No cranial nerve deficit. She exhibits normal muscle tone. Coordination normal.  Skin: Skin is warm and dry. No rash noted. She is not diaphoretic. No erythema. No pallor.  Psychiatric: She has a normal mood and affect. Her behavior is normal. Judgment and thought content normal.          Assessment & Plan:

## 2012-07-03 NOTE — Progress Notes (Signed)
Subjective:    Patient ID: Victoria [name], female    DOB: 1945/09/03, 67 y.o.   MRN: [id number]  HPITN  Ms. Klas presents for follow up of Hypertension, hyperlipidemia, COPD. She also is followed for obesity. Since her last visit in July '13 she has had a bout of "Flu" in Jan '14. She reports that her temperature was up to 100. She was sick for about 2 weeks with increased breathing problems. She has otherwise been health.  She has gained 16 lbs since July: 203 to 219.med  lbs.  Past Medical History  Diagnosis Date  . Asthma     uses advair   . Arthritis     left ankle, right knee, right SI joint, wrists  . Varicella as child  . 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 Hypertension   . Hyperlipidemia   . Tuberculosis 2009    h/o positive PPD- seen at health department, had  normal chest x-ray. she was not treated for a  new conversion.  [name] COPD (chronic obstructive pulmonary disease)    Past Surgical History  Procedure Laterality Date  . Excision of pelvic absess, right ovary      2008  . Appendectomy      2008  . Cesarean section      1972   Family History  Problem Relation Age of Onset  . Heart disease Mother     MI - fatal  . Hypertension Mother   . Stroke Father 13    fatal  . Alzheimer's disease Father   . Alzheimer's disease Brother   . Hyperlipidemia Brother   . Hypertension Brother   . Diabetes Brother   . Hypertension Brother   . Hyperlipidemia Brother    History   Social History  . Marital Status: Divorced    Spouse Name: N/A    Number of Children: 1  . Years of Education: 16   Occupational History  . 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 Smoking status: Former Smoker -- 0.50 packs/day for 40 years    Types: Cigarettes    Quit date: 04/16/2011  . Smokeless tobacco: Never Used  . Alcohol Use: Yes     Comment: rare occasion  . Drug Use: No  . Sexually Active: No   Other Topics Concern  . 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 . Married '69 - 9 yrs/divorced. 1 son - '72; no grandchildren.   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 No h/o physical or sexual abuse. ACP - no living will - wants information. Provided packet of information. On 08/05/2011: she stated she was distant cousins to celebrities Cleda Clarks and Felicie Morn         Current Outpatient Prescriptions on File Prior to Visit  Medication Sig Dispense Refill  . amLODipine (NORVASC) 10 MG tablet Take 1 tablet (10 mg total) by mouth daily.  30 tablet  6  . aspirin 81 MG tablet Take 81 mg by mouth daily.        . cholecalciferol (VITAMIN D) 1000 UNITS tablet Take 5,000 Units by mouth daily.       . Fluticasone-Salmeterol (ADVAIR) 250-50 MCG/DOSE AEPB Inhale 1 puff into the lungs every 12 (twelve) hours.  60 each  6  . lisinopril-hydrochlorothiazide (PRINZIDE,ZESTORETIC) 20-12.5 MG per tablet Take 1 tablet by mouth daily.  30 tablet  6  .  lovastatin (MEVACOR) 20 MG tablet Take 1 tablet (20 mg total) by mouth daily.  30 tablet  6  . OVER THE COUNTER MEDICATION Take 2 tablets by mouth daily. Calcium,magnesium, and zinc combination       . tiotropium (SPIRIVA HANDIHALER) 18 MCG inhalation capsule Place 1 capsule (18 mcg total) into inhaler and inhale daily.  30 capsule  6  . VENTOLIN HFA 108 (90 BASE) MCG/ACT inhaler INHALE ONE TO TWO PUFFS EVERY 6 HOURS AS NEEDED FOR WHEEZING  18 g  1  . Flaxseed, Linseed, (FLAX SEEDS PO) Take 1 tablet by mouth daily.         No current facility-administered medications on file prior to visit.       Review of Systems System review is negative for any constitutional, cardiac, pulmonary, GI or neuro symptoms or complaints other than as described in the HPI.     Objective:   Physical Exam Filed Vitals:   07/03/12 1357  BP: 138/70  Pulse: 98  Temp: 98.2 F (36.8 C)  Resp: 10   gen'l- overweight AA woman in no  distress HEENT- C&S clear, PERRLA Neck- supple Cor- 2+ radial RRR Pulm - no increased WOB, no wheezes or rales Neuro - Alert and oriented.  Labs are pending       Assessment & Plan:

## 2012-07-03 NOTE — Patient Instructions (Addendum)
#  Weight control  - we discussed challenges with [facility] low glycemic diet - this is a process, keep trying - gave you tips to control cravings like eating nut pack and low glycemic fruit serving   - On the diet sheet:   - foods in left column - you eat only those daily.   - Foods in the middle column - eat those once a month  - Foods in the right column - eat those only 4-6 times a year on special occassions - Next you will need exercise; to start this refer pulmonary rehab  #ASthma/copd   - Glad you are better after most recent flulike illness   -Continue advair  And spiriva; will ensure e-refills or script refills   #FOllowup  - 9 months - at followup repeat spirometry  - come sooner if there are problems

## 2012-07-03 NOTE — Patient Instructions (Addendum)
Thanks for coming in to see me. I am so glad you got over the Flu. Let us not miss the flu shot next year.  We will check all your labs today after you see Dr. Marchelle Gearing.  Weight management: smart food choices, PORTION SIZE CONTROL!!!!!, regular exercise.   Goal is to loose 2 lbs/months with a target weight of 180 lbs.  Continue all your present medications.  Please sign up for MyChart

## 2012-07-04 NOTE — Assessment & Plan Note (Signed)
BP Readings from Last 3 Encounters:  07/03/12 146/76  07/03/12 138/70  12/06/11 160/82   Borderline control. Lab is pending  Plan Weight mangement  Continue present medication  Recheck BP in 3 months

## 2012-07-04 NOTE — Assessment & Plan Note (Signed)
Wt Readings from Last 3 Encounters:  07/03/12 219 lb (99.338 kg)  07/03/12 219 lb 0.6 oz (99.356 kg)  12/06/11 203 lb (92.08 kg)   Encouraged to manage her weight better: smart food choices, PORTION SIZE CONTROL, exercise Target weight 180 lbs.

## 2012-07-04 NOTE — Assessment & Plan Note (Signed)
Last lab 7 months ago with good control with LDL better than goal.  Plan Follow up lab with recommendations to follow

## 2012-07-04 NOTE — Assessment & Plan Note (Signed)
No respiratory distress. For follow up with pulmonary

## 2012-07-06 ENCOUNTER — Other Ambulatory Visit (INDEPENDENT_AMBULATORY_CARE_PROVIDER_SITE_OTHER): Payer: Medicare PPO

## 2012-07-06 DIAGNOSIS — I10 Essential (primary) hypertension: Secondary | ICD-10-CM

## 2012-07-06 DIAGNOSIS — E785 Hyperlipidemia, unspecified: Secondary | ICD-10-CM

## 2012-07-06 LAB — COMPREHENSIVE METABOLIC PANEL
AST: 11 U/L (ref 0–37)
Albumin: 3.8 g/dL (ref 3.5–5.2)
Alkaline Phosphatase: 78 U/L (ref 39–117)
BUN: 16 mg/dL (ref 6–23)
Glucose, Bld: 96 mg/dL (ref 70–99)
Potassium: 4.3 mEq/L (ref 3.5–5.1)
Sodium: 139 mEq/L (ref 135–145)
Total Bilirubin: 0.7 mg/dL (ref 0.3–1.2)

## 2012-07-06 LAB — LIPID PANEL
Cholesterol: 183 mg/dL (ref 0–200)
HDL: 57.5 mg/dL (ref >39.00)
LDL Cholesterol: 107 mg/dL — ABNORMAL HIGH (ref 0–99)
VLDL: 18.4 mg/dL (ref 0.0–40.0)

## 2012-07-06 LAB — HEPATIC FUNCTION PANEL
ALT: 5 U/L (ref 0–35)
AST: 11 U/L (ref 0–37)
Bilirubin, Direct: 0.1 mg/dL (ref 0.0–0.3)
Total Bilirubin: 0.7 mg/dL (ref 0.3–1.2)
Total Protein: 7.2 g/dL (ref 6.0–8.3)

## 2012-07-06 NOTE — Assessment & Plan Note (Signed)
#  Weight control  - we discussed challenges with [facility] low glycemic diet - this is a process, keep trying - gave you tips to control cravings like eating nut pack and low glycemic fruit serving   - On the diet sheet:   - foods in left column - you eat only those daily.   - Foods in the middle column - eat those once a month  - Foods in the right column - eat those only 4-6 times a year on special occassions - Next you will need exercise; to start this refer pulmonary rehab   (> 50% of this 15 min visit spent in face to face counseling)

## 2012-07-06 NOTE — Assessment & Plan Note (Signed)
#  ASthma/copd   - Glad you are better after most recent flulike illness   -Continue advair  And spiriva; will ensure e-refills or script refills   #FOllowup  - 9 months - at followup repeat spirometry  - come sooner if there are problems

## 2012-07-09 ENCOUNTER — Encounter: Payer: Self-pay | Admitting: Internal Medicine

## 2012-09-08 ENCOUNTER — Telehealth: Payer: Self-pay | Admitting: Internal Medicine

## 2012-09-08 DIAGNOSIS — Z0000 Encounter for general adult medical examination without abnormal findings: Secondary | ICD-10-CM

## 2012-09-08 NOTE — Telephone Encounter (Signed)
Done per emr 

## 2012-09-08 NOTE — Telephone Encounter (Signed)
Patient contacted (left detailed msg. ) of referral done as requested.

## 2012-09-08 NOTE — Telephone Encounter (Signed)
Patient needs a referral sent to her OBGYN for her annual due to insurance, send referral for Dr. Arelia Sneddon at [facility] to Attn. Linda at fax number [phone]

## 2012-11-15 ENCOUNTER — Other Ambulatory Visit (HOSPITAL_COMMUNITY): Payer: Self-pay | Admitting: Obstetrics and Gynecology

## 2012-11-15 DIAGNOSIS — E041 Nontoxic single thyroid nodule: Secondary | ICD-10-CM

## 2012-11-20 ENCOUNTER — Ambulatory Visit (HOSPITAL_COMMUNITY)
Admission: RE | Admit: 2012-11-20 | Discharge: 2012-11-20 | Disposition: A | Discharge status: Home / Self Care | Payer: Medicare PPO | Source: Ambulatory Visit | Attending: Obstetrics and Gynecology | Admitting: Obstetrics and Gynecology

## 2012-11-20 DIAGNOSIS — E041 Nontoxic single thyroid nodule: Secondary | ICD-10-CM | POA: Insufficient documentation

## 2013-02-01 ENCOUNTER — Other Ambulatory Visit: Payer: Medicare HMO

## 2013-02-01 ENCOUNTER — Encounter: Payer: Self-pay | Admitting: Internal Medicine

## 2013-02-01 ENCOUNTER — Ambulatory Visit (INDEPENDENT_AMBULATORY_CARE_PROVIDER_SITE_OTHER): Payer: Medicare PPO | Admitting: Internal Medicine

## 2013-02-01 VITALS — BP 140/62 | HR 88 | Temp 99.5°F | Wt 216.0 lb

## 2013-02-01 DIAGNOSIS — I10 Essential (primary) hypertension: Secondary | ICD-10-CM

## 2013-02-01 DIAGNOSIS — Z0000 Encounter for general adult medical examination without abnormal findings: Secondary | ICD-10-CM

## 2013-02-01 DIAGNOSIS — J449 Chronic obstructive pulmonary disease, unspecified: Secondary | ICD-10-CM

## 2013-02-01 DIAGNOSIS — Z23 Encounter for immunization: Secondary | ICD-10-CM

## 2013-02-01 DIAGNOSIS — E669 Obesity, unspecified: Secondary | ICD-10-CM

## 2013-02-01 LAB — COMPREHENSIVE METABOLIC PANEL
ALT: 6 U/L (ref 0–35)
AST: 11 U/L (ref 0–37)
Albumin: 3.9 g/dL (ref 3.5–5.2)
Alkaline Phosphatase: 74 U/L (ref 39–117)
BUN: 16 mg/dL (ref 6–23)
CO2: 32 mEq/L (ref 19–32)
Calcium: 9.7 mg/dL (ref 8.4–10.5)
Chloride: 100 mEq/L (ref 96–112)
Creatinine, Ser: 0.9 mg/dL (ref 0.4–1.2)
GFR: 75.93 mL/min (ref >60.00)
Glucose, Bld: 97 mg/dL (ref 70–99)
Potassium: 4 mEq/L (ref 3.5–5.1)
Sodium: 137 mEq/L (ref 135–145)
Total Bilirubin: 0.4 mg/dL (ref 0.3–1.2)
Total Protein: 7.1 g/dL (ref 6.0–8.3)

## 2013-02-01 MED ORDER — TIOTROPIUM BROMIDE MONOHYDRATE 18 MCG IN CAPS: 18.0000 ug | ORAL_CAPSULE | Freq: Every day | RESPIRATORY_TRACT | Status: DC

## 2013-02-01 MED ORDER — LISINOPRIL-HYDROCHLOROTHIAZIDE 20-12.5 MG PO TABS: 1.0000 | ORAL_TABLET | Freq: Every day | ORAL | Status: DC

## 2013-02-01 MED ORDER — LOVASTATIN 20 MG PO TABS: 20.0000 mg | ORAL_TABLET | Freq: Every day | ORAL | Status: DC

## 2013-02-01 MED ORDER — FLUTICASONE-SALMETEROL 250-50 MCG/DOSE IN AEPB: 1.0000 | INHALATION_SPRAY | Freq: Two times a day (BID) | RESPIRATORY_TRACT | Status: DC

## 2013-02-01 MED ORDER — AMLODIPINE BESYLATE 10 MG PO TABS: 10.0000 mg | ORAL_TABLET | Freq: Every day | ORAL | Status: DC

## 2013-02-01 NOTE — Progress Notes (Signed)
Subjective:    Patient ID: Victoria [name], female    DOB: Jan 29, 1946, 67 y.o.   MRN: [id number]  HPI Victoria [name] presents for med refills and 6 months follow up. She reports that she has been doing well. She has lost 3 lbs since Feb. She does follow with Dr. Marchelle Gearing. She is having a fruit smoothie for breakfast. She has been feeling well and had a good Birthday on Monday.   Past Medical History  Diagnosis Date  . Asthma     uses advair   . Arthritis     left ankle, right knee, right SI joint, wrists  . Varicella as child  . 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 Hypertension   . Hyperlipidemia   . Tuberculosis 2009    h/o positive PPD- seen at health department, had  normal chest x-ray. she was not treated for a  new conversion.  [name] COPD (chronic obstructive pulmonary disease)    Past Surgical History  Procedure Laterality Date  . Excision of pelvic absess, right ovary      2008  . Appendectomy      2008  . Cesarean section      1972   Family History  Problem Relation Age of Onset  . Heart disease Mother     MI - fatal  . Hypertension Mother   . Stroke Father 7    fatal  . Alzheimer's disease Father   . Alzheimer's disease Brother   . Hyperlipidemia Brother   . Hypertension Brother   . Diabetes Brother   . Hypertension Brother   . Hyperlipidemia Brother    History   Social History  . Marital Status: Divorced    Spouse Name: N/A    Number of Children: 1  . Years of Education: 16   Occupational History  . 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 Smoking status: Former Smoker -- 0.50 packs/day for 40 years    Types: Cigarettes    Quit date: 04/16/2011  . Smokeless tobacco: Never Used  . Alcohol Use: Yes     Comment: rare occasion  . Drug Use: No  . Sexual Activity: No   Other Topics Concern  . 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 . Married '69 - 9 yrs/divorced.  1 son - '72; no grandchildren.   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 No h/o physical or sexual abuse. ACP - no living will - wants information. Provided packet of information. On 08/05/2011: she stated she was distant cousins to celebrities Victoria [name] and Victoria [name]           Current Outpatient Prescriptions on File Prior to Visit  Medication Sig Dispense Refill  . albuterol (VENTOLIN HFA) 108 (90 BASE) MCG/ACT inhaler Inhale 1 puff into the lungs every 6 (six) hours as needed for wheezing.  18 g  1  . albuterol (VENTOLIN HFA) 108 (90 BASE) MCG/ACT inhaler Inhale 1 puff into the lungs every 6 (six) hours as needed for wheezing.  18 g  1  . aspirin 81 MG tablet Take 81 mg by mouth daily.        . cholecalciferol (VITAMIN D) 1000 UNITS tablet Take 5,000 Units by mouth daily.       [name] OVER THE COUNTER MEDICATION Take 2 tablets by mouth daily. Calcium,magnesium, and zinc combination  No current facility-administered medications on file prior to visit.      Review of Systems System review is negative for any constitutional, cardiac, pulmonary, GI or neuro symptoms or complaints other than as described in the HPI.     Objective:   Physical Exam Filed Vitals:   02/01/13 1538  BP: 140/62  Pulse: 88  Temp: 99.5 F (37.5 C)   Wt Readings from Last 3 Encounters:  02/01/13 216 lb (97.977 kg)  07/03/12 219 lb (99.338 kg)  07/03/12 219 lb 0.6 oz (99.356 kg)   BP Readings from Last 3 Encounters:  02/01/13 140/62  07/03/12 146/76  07/03/12 138/70   Gen'l  Overweight AA woman in no distress HEENT - C&S clear, PERRLA Cor - 2+ radial, RRR Pulm - normal respirations, no increased WOB, no wheezing Neuro - A&O x 3, normal gait, normal strength        Assessment & Plan:

## 2013-02-01 NOTE — Patient Instructions (Addendum)
HAPPY BIRTHDAY TO YOU, HAPPY BIRTHDAY TO YOU, HAPPY HAPPY BIRTHDAY  You seem to be doing well. BP is good. Cholesterol was checked in Feb '14 and was great. It needs to be checked next Feb.  Will check a basic metabolic panel for kidney function and potassium  Will give you a tetanus booster today and Prevnar - a new pneumonia vaccine. Check on shingles vaccine coverage.   Refill Rx's will be sent to your pharmacy.  I would like to see you in Feb.

## 2013-02-02 ENCOUNTER — Encounter: Payer: Self-pay | Admitting: Internal Medicine

## 2013-02-05 NOTE — Assessment & Plan Note (Signed)
Body mass index is 35.38 kg/(m^2).  Weight management: Diet management: smart food choices, PORTION SIZE CONTROL, regular exercise. Goal - to loose 1-2 lbs.month. Target weight - 175

## 2013-02-05 NOTE — Assessment & Plan Note (Signed)
-  Appears to be stable at this time  °  °

## 2013-02-05 NOTE — Assessment & Plan Note (Signed)
BP Readings from Last 3 Encounters:  02/01/13 140/62  07/03/12 146/76  07/03/12 138/70   Adequate control on present medications

## 2013-02-13 ENCOUNTER — Encounter (INDEPENDENT_AMBULATORY_CARE_PROVIDER_SITE_OTHER): Payer: Self-pay

## 2013-04-02 ENCOUNTER — Ambulatory Visit: Payer: Medicare PPO | Admitting: Internal Medicine

## 2013-04-09 ENCOUNTER — Telehealth: Payer: Self-pay | Admitting: Internal Medicine

## 2013-04-09 NOTE — Telephone Encounter (Signed)
I called and spoke with pt. I advised her she has not had one since 2013. She thought she had one sooner. Nothing further needed

## 2013-04-23 ENCOUNTER — Encounter: Payer: Self-pay | Admitting: Internal Medicine

## 2013-04-23 ENCOUNTER — Ambulatory Visit (INDEPENDENT_AMBULATORY_CARE_PROVIDER_SITE_OTHER)
Admission: RE | Admit: 2013-04-23 | Discharge: 2013-04-23 | Disposition: A | Discharge status: Home / Self Care | Payer: Medicare PPO | Source: Ambulatory Visit | Attending: Internal Medicine | Admitting: Internal Medicine

## 2013-04-23 ENCOUNTER — Ambulatory Visit (INDEPENDENT_AMBULATORY_CARE_PROVIDER_SITE_OTHER): Payer: Medicare PPO | Admitting: Internal Medicine

## 2013-04-23 VITALS — BP 138/86 | HR 85 | Temp 97.8°F | Ht 65.5 in | Wt 216.0 lb

## 2013-04-23 DIAGNOSIS — J4489 Other specified chronic obstructive pulmonary disease: Secondary | ICD-10-CM

## 2013-04-23 DIAGNOSIS — Z111 Encounter for screening for respiratory tuberculosis: Secondary | ICD-10-CM

## 2013-04-23 DIAGNOSIS — J449 Chronic obstructive pulmonary disease, unspecified: Secondary | ICD-10-CM

## 2013-04-23 NOTE — Patient Instructions (Addendum)
#  ASthma/copd   - Glad you are up-to-date with a vaccine - COPD is stable but I do understand that he has significant shortness of breath - Stop Spiriva and Advair - Start ANORO 1 puff daily and AEROSPAN 2 puff twice dailuy - Albuterol as needed - Will refer to pulmonary rehab again - Test you for overnight oxygen status - Based on above, consider dc lisinopril  #TB screening  - CXR today per employer  request  #FOllowup  -4 weeks with NP Tammy for medicine calendar - at ollowup repeat spirometry  To be considered - come sooner if there are problems 

## 2013-04-23 NOTE — Progress Notes (Signed)
Subjective:    Patient ID: Victoria [name], female    DOB: [birth year], 67 y.o.   MRN: [id number]  HPI  #COPD/Asthma  - 50 pack smoker, quit Jan 2012 -   CXR 04/18/11  -copd, lingular atelectasis - PFT 06/19/11 (done on advair):  - fev1 0.97L/43%, Ratio 43, TLC 5L/104%, DLCO 12.3/51%, No BD response - CAT score 7 - May 2012 - Rx spiriva and advair - Alpha 1 11/23/2011:    #Obesity  Body mass index is 34.61 kg/(m^2). 08/05/11 Body mass index is 33.76 kg/(m^2). - 09/15/2011 Body mass index is 35.46 kg/(m^2). - 11/23/2011 Body mass index is 35.38 kg/(m^2). on 04/23/2013    HPI     OV 07/03/2012 COPD/asthma. She is doing well in January 2014 she thinks she had the influenza although this was not confirmed but symptoms were consistent with that. She took 2 weeks of work and now she is feeling completely fine. COPD cat score is 14 which is his baseline although it is elevated compared to 6 at prior visit. No new issues  She struggling with weight loss particularly  With  Discipline at end of the day    OV 04/23/2013  Chief Complaint  Patient presents with  . Follow-up    Pt c/o SOB with exertion, occasional chest tightness.   Followup Gold stage III COPD  Last seen 9 months ago. Overall COPD stable. COPD cat score is unchanged and 13 but she is having significant dyspnea on exertion. 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 Other than that she does continue to have stable dyspnea on exertion associated with wheeze. The severity of cough is extremely mild even though she is on ACE inhibitor. She is also having difficulty affording her inhalers Spiriva and Advair. She ran out of these inhalers for a few days when she hit the donut hole and this made her dyspnea worse. No associated sputum of COPD exacerbation.  She's never attended pulmonary rehabilitation due to logistical problems which is open to trying it  again.  Is not tested of oxygen levels in a long time   She's not lost any weight  Noted she is on ACE inhibitor; she is reluctant to chang  CAT COPD Symptom and Quality of Life Score ([employer] trademark)   11/23/11 07/03/2012  04/23/2013   Never Cough -> Cough all the time 1 2 1   No phlegm in chest -> Chest is full of phlegm 0 1 1  No chest tightness -> Chest feels very tight 0 2 2  No dyspnea for 1 flight stairs/hill -> Very dyspneic for 1 flight of stairs 1 3 4   No limitations for ADL at home -> Very limited with ADL at home 1 2 3   Confident leaving home -> Not at all confident leaving home 0 0 0  Sleep soundly -> Do not sleep soundly because of lung condition 1 1 0  Lots of Energy -> No energy at all 2 3 2   TOTAL Score (max 40)  6 14 13             Review of Systems  Constitutional: Negative for fever and unexpected weight change.  HENT: Positive for postnasal drip and sinus pressure. Negative for congestion, dental problem, ear pain, nosebleeds, rhinorrhea, sneezing, sore throat and trouble swallowing.   Eyes: Negative for redness and itching.  Respiratory: Positive for chest tightness and shortness  of breath. Negative for cough and wheezing.   Cardiovascular: Negative for palpitations and leg swelling.  Gastrointestinal: Negative for nausea and vomiting.  Genitourinary: Negative for dysuria.  Musculoskeletal: Negative for joint swelling.  Skin: Negative for rash.  Neurological: Negative for headaches.  Hematological: Does not bruise/bleed easily.  Psychiatric/Behavioral: Negative for dysphoric mood. The patient is not nervous/anxious.    Current outpatient prescriptions:albuterol (VENTOLIN HFA) 108 (90 BASE) MCG/ACT inhaler, Inhale 1 puff into the lungs every 6 (six) hours as needed for wheezing., Disp: 18 g, Rfl: 1;  amLODipine (NORVASC) 10 MG tablet, Take 1 tablet (10 mg total) by mouth daily., Disp: 30 tablet, Rfl: 6;  aspirin 81 MG tablet, Take 81 mg by mouth  daily.  , Disp: , Rfl: ;  cholecalciferol (VITAMIN D) 1000 UNITS tablet, Take 5,000 Units by mouth daily. , Disp: , Rfl:  Fluticasone-Salmeterol (ADVAIR) 250-50 MCG/DOSE AEPB, Inhale 1 puff into the lungs every 12 (twelve) hours., Disp: 60 each, Rfl: 6;  lisinopril-hydrochlorothiazide (PRINZIDE,ZESTORETIC) 20-12.5 MG per tablet, Take 1 tablet by mouth daily., Disp: 30 tablet, Rfl: 6;  lovastatin (MEVACOR) 20 MG tablet, Take 1 tablet (20 mg total) by mouth daily., Disp: 30 tablet, Rfl: 6 tiotropium (SPIRIVA HANDIHALER) 18 MCG inhalation capsule, Place 1 capsule (18 mcg total) into inhaler and inhale daily., Disp: 30 capsule, Rfl: 6       Objective:   Physical Exam  Vitals reviewed. Constitutional: She is oriented to person, place, and time. She appears well-developed and well-nourished. No distress.  HENT:  Head: Normocephalic and atraumatic.  Right Ear: External ear normal.  Left Ear: External ear normal.  Mouth/Throat: Oropharynx is clear and moist. No oropharyngeal exudate.  Eyes: Conjunctivae and EOM are normal. Pupils are equal, round, and reactive to light. Right eye exhibits no discharge. Left eye exhibits no discharge. No scleral icterus.  Neck: Normal range of motion. Neck supple. No JVD present. No tracheal deviation present. No thyromegaly present.  Cardiovascular: Normal rate, regular rhythm, normal heart sounds and intact distal pulses.  Exam reveals no gallop and no friction rub.   No murmur heard. Pulmonary/Chest: Effort normal and breath sounds normal. No respiratory distress. She has no wheezes. She has no rales. She exhibits no tenderness.  Overall diminished air entry.  Abdominal: Soft. Bowel sounds are normal. She exhibits no distension and no mass. There is no tenderness. There is no rebound and no guarding.  Musculoskeletal: Normal range of motion. She exhibits no edema and no tenderness.  Lymphadenopathy:    She has no cervical adenopathy.  Neurological: She is alert  and oriented to person, place, and time. She has normal reflexes. No cranial nerve deficit. She exhibits normal muscle tone. Coordination normal.  Skin: Skin is warm and dry. No rash noted. She is not diaphoretic. No erythema. No pallor.  Psychiatric: She has a normal mood and affect. Her behavior is normal. Judgment and thought content normal.          Assessment & Plan:

## 2013-04-24 ENCOUNTER — Telehealth: Payer: Self-pay | Admitting: Internal Medicine

## 2013-04-24 NOTE — Telephone Encounter (Signed)
No TB. Just emphysema per MR result note. Pt aware. Carron Curie, CMA

## 2013-05-04 DIAGNOSIS — Z111 Encounter for screening for respiratory tuberculosis: Secondary | ICD-10-CM | POA: Insufficient documentation

## 2013-05-04 NOTE — Assessment & Plan Note (Signed)
cxr per emppluyer request. Asymptomatic. This is an annual CXR

## 2013-05-04 NOTE — Assessment & Plan Note (Signed)
#  ASthma/copd   - Glad you are up-to-date with a vaccine - COPD is stable but I do understand that he has significant shortness of breath - Stop Spiriva and Advair - Start ANORO 1 puff daily and AEROSPAN 2 puff twice dailuy - Albuterol as needed - Will refer to pulmonary rehab again - Test you for overnight oxygen status - Based on above, consider dc lisinopril  #TB screening  - CXR today per employer  request  #FOllowup  -4 weeks with NP Tammy for medicine calendar - at ollowup repeat spirometry  To be considered - come sooner if there are problems

## 2013-05-08 ENCOUNTER — Telehealth: Payer: Self-pay | Admitting: Internal Medicine

## 2013-05-08 DIAGNOSIS — J449 Chronic obstructive pulmonary disease, unspecified: Secondary | ICD-10-CM

## 2013-05-08 NOTE — Telephone Encounter (Signed)
Seeing TP 05/22/13  PNO <= 88% at 133.61minutes.Qualifies for 2L [place] o2 at night  Dr. Kalman Shan, M.D., [facility].C.P Pulmonary and Critical Care Medicine Staff Physician  System [place] [facility] Pager: [phone], If no answer or between  15:00h - 7:00h: call [phone]  05/08/2013 9:19 PM

## 2013-05-22 ENCOUNTER — Encounter: Payer: Medicare PPO | Admitting: Adult Health

## 2013-05-22 ENCOUNTER — Telehealth: Payer: Self-pay | Admitting: Internal Medicine

## 2013-05-22 MED ORDER — UMECLIDINIUM-VILANTEROL 62.5-25 MCG/INH IN AEPB: 1.0000 | INHALATION_SPRAY | Freq: Every day | RESPIRATORY_TRACT | Status: AC

## 2013-05-22 MED ORDER — FLUNISOLIDE HFA 80 MCG/ACT IN AERS: 2.0000 | INHALATION_SPRAY | Freq: Two times a day (BID) | RESPIRATORY_TRACT | Status: AC

## 2013-05-22 NOTE — Telephone Encounter (Signed)
Per 12.8.14 ov w/ MR: Patient Instructions     #ASthma/copd  - Glad you are up-to-date with a vaccine  - COPD is stable but I do understand that he has significant shortness of breath  - Stop Spiriva and Advair  - Start ANORO 1 puff daily and AEROSPAN 2 puff twice dailuy  - Albuterol as needed  - Will refer to pulmonary rehab again  - Test you for overnight oxygen status  - Based on above, consider dc lisinopril  #TB screening  - CXR today per employer request  #FOllowup  -4 weeks with NP Tammy for medicine calendar  - at ollowup repeat spirometry To be considered  - come sooner if there are problems   Pt came for med calendar but did not bring meds with her.  Med Cal appt rescheduled for 1.19.15 w/ TP, however pt does not have enough Anoro or Aerospan samples to last until next ov.  Samples given to patient and documented per protocol.  Nothing further needed; will sign off.

## 2013-05-24 NOTE — Telephone Encounter (Signed)
Oxygen was not ordered so I have lm to advise the pt about the need for oxygen and that order will be placed. [place] Bing, CMA

## 2013-05-24 NOTE — Telephone Encounter (Signed)
Pt rescheduled OV  Her ONO was positive for desats Was order sent for o2 2 At bedtime   If not please call pt and set up  Tell her to keep ov with me as well

## 2013-05-24 NOTE — Telephone Encounter (Signed)
I spoke with the pt and advised of results. She is not currently on oxygen so order was placed. Pt will keep appt for med calender. Mag[place] Bing, CMA

## 2013-05-24 NOTE — Addendum Note (Signed)
Addended by: Lilli Few on: 05/24/2013 03:54 PM   Modules accepted: Orders

## 2013-06-04 ENCOUNTER — Encounter: Payer: Self-pay | Admitting: Adult Health

## 2013-06-04 ENCOUNTER — Ambulatory Visit (INDEPENDENT_AMBULATORY_CARE_PROVIDER_SITE_OTHER): Payer: Medicare HMO | Admitting: Adult Health

## 2013-06-04 VITALS — BP 116/68 | HR 88 | Temp 98.2°F | Ht 65.5 in | Wt 217.6 lb

## 2013-06-04 DIAGNOSIS — J449 Chronic obstructive pulmonary disease, unspecified: Secondary | ICD-10-CM

## 2013-06-04 DIAGNOSIS — I10 Essential (primary) hypertension: Secondary | ICD-10-CM

## 2013-06-04 MED ORDER — VALSARTAN-HYDROCHLOROTHIAZIDE 160-12.5 MG PO TABS: 1.0000 | ORAL_TABLET | Freq: Every day | ORAL | Status: DC

## 2013-06-04 MED ORDER — FLUNISOLIDE HFA 80 MCG/ACT IN AERS: 1.0000 | INHALATION_SPRAY | Freq: Two times a day (BID) | RESPIRATORY_TRACT | Status: DC

## 2013-06-04 MED ORDER — UMECLIDINIUM-VILANTEROL 62.5-25 MCG/INH IN AEPB: 1.0000 | INHALATION_SPRAY | Freq: Every day | RESPIRATORY_TRACT | Status: DC

## 2013-06-04 NOTE — Assessment & Plan Note (Signed)
Stop ACE d/t cough  follow up with PCP for HTN   Plan  Stop Lisinopril HCTZ  Begin [name] HCTZ 160/12.5mg  daily  Follow med calendar closely and bring to each visit.  follow up Dr. Chase Caller in 3 months and As needed

## 2013-06-04 NOTE — Progress Notes (Signed)
Subjective:    Patient ID: Victoria [name], female    DOB: 1945-07-05, 68 y.o.   MRN: [id number]  HPI  #COPD/Asthma  - 50 pack smoker, quit Jan 2012 -   CXR 04/18/11  -copd, lingular atelectasis - PFT 06/19/11 (done on advair):  - fev1 0.97L/43%, Ratio 43, TLC 5L/104%, DLCO 12.3/51%, No BD response - CAT score 7 - May 2012 - Rx spiriva and advair - Alpha 1 11/23/2011:    #Obesity  Body mass index is 34.61 kg/(m^2). 08/05/11 Body mass index is 33.76 kg/(m^2). - 09/15/2011 Body mass index is 35.46 kg/(m^2). - 11/23/2011 Body mass index is 35.38 kg/(m^2). on 04/23/2013    HPI     OV 07/03/2012 COPD/asthma. She is doing well in January 2014 she thinks she had the influenza although this was not confirmed but symptoms were consistent with that. She took 2 weeks of work and now she is feeling completely fine. COPD cat score is 14 which is his baseline although it is elevated compared to 6 at prior visit. No new issues  She struggling with weight loss particularly  With  Discipline at end of the day    OV 04/23/2013  Chief Complaint  Patient presents with  . Follow-up    Pt c/o SOB with exertion, occasional chest tightness.   Followup Gold stage III COPD  Last seen 9 months ago. Overall COPD stable. COPD cat score is unchanged and 13 but she is having significant dyspnea on exertion. 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 Other than that she does continue to have stable dyspnea on exertion associated with wheeze. The severity of cough is extremely mild even though she is on ACE inhibitor. She is also having difficulty affording her inhalers Spiriva and Advair. She ran out of these inhalers for a few days when she hit the donut hole and this made her dyspnea worse. No associated sputum of COPD exacerbation.  She's never attended pulmonary rehabilitation due to logistical problems which is open to trying it  again.  Is not tested of oxygen levels in a long time   She's not lost any weight  Noted she is on ACE inhibitor; she is reluctant to chang  CAT COPD Symptom and Quality of Life Score ([employer] trademark)   11/23/11 07/03/2012  04/23/2013   Never Cough -> Cough all the time 1 2 1   No phlegm in chest -> Chest is full of phlegm 0 1 1  No chest tightness -> Chest feels very tight 0 2 2  No dyspnea for 1 flight stairs/hill -> Very dyspneic for 1 flight of stairs 1 3 4   No limitations for ADL at home -> Very limited with ADL at home 1 2 3   Confident leaving home -> Not at all confident leaving home 0 0 0  Sleep soundly -> Do not sleep soundly because of lung condition 1 1 0  Lots of Energy -> No energy at all 2 3 2   TOTAL Score (max 40)  6 14 13       06/04/2013 Follow up and Med Review  Returns for follow up and med review .  We reviewed all her meds and organized them into a med calendar. Appears she is taking her meds correctly.  Reports breathing is doing well, no new complaints.   Anoro and Jeralyn Ruths are working well Last ov , Stop Spiriva and  Advair, Rx  ANORO and AEROSPAN  On ACE inhibitor. She does has hx of recurrent cough.  ONO last ov ,showed desats nocturnally. Started on O2 At bedtime  At 2 l/m .  She does feel that her breathing has improved on her current regimen. Denies any hemoptysis, orthopnea, PND, or leg swelling   Review of Systems  Constitutional: Negative for fever and unexpected weight change.  HENT: Positive for postnasal drip and sinus pressure. Negative for congestion, dental problem, ear pain, nosebleeds, rhinorrhea, sneezing, sore throat and trouble swallowing.   Eyes: Negative for redness and itching.  Respiratory: Positive for shortness of breath. Negative for cough and wheezing.   Cardiovascular: Negative for palpitations and leg swelling.  Gastrointestinal: Negative for nausea and vomiting.  Genitourinary: Negative for dysuria.   Musculoskeletal: Negative for joint swelling.  Skin: Negative for rash.  Neurological: Negative for headaches.  Hematological: Does not bruise/bleed easily.  Psychiatric/Behavioral: Negative for dysphoric mood. The patient is not nervous/anxious.      Objective:   Physical Exam  GEN: A/Ox3; pleasant , NAD, well nourished   HEENT:  [place]/AT,  EACs-clear, TMs-wnl, NOSE-clear, THROAT-clear, no lesions, no postnasal drip or exudate noted.   NECK:  Supple w/ fair ROM; no JVD; normal carotid impulses w/o bruits; no thyromegaly or nodules palpated; no lymphadenopathy.  RESP  Clear  P & A; w/o, wheezes/ rales/ or rhonchi.no accessory muscle use, no dullness to percussion  CARD:  RRR, no m/r/g  , no peripheral edema, pulses intact, no cyanosis or clubbing.  GI:   Soft & nt; nml bowel sounds; no organomegaly or masses detected.  Musco: Warm bil, no deformities or joint swelling noted.   Neuro: alert, no focal deficits noted.    Skin: Warm, no lesions or rashes          Assessment & Plan:

## 2013-06-04 NOTE — Addendum Note (Signed)
Addended by: Parke Poisson E on: 06/04/2013 05:54 PM   Modules accepted: Orders, Medications

## 2013-06-04 NOTE — Patient Instructions (Signed)
Stop Lisinopril HCTZ  Begin [name] HCTZ 160/12.5mg  daily  Follow med calendar closely and bring to each visit.  Continue on Aerospan 2 puffs daily  Continue on ANORO 1 puff daily  Continue on Oxygen 2l/m At bedtime   follow up Dr. Chase Caller in 3 months and As needed

## 2013-06-04 NOTE — Assessment & Plan Note (Addendum)
Improved control on current regimen. [name] medications were reviewed today and patient education was given. Computerized medication calendar was adjusted/completed  Will stop ACE due to potential for cough flare   Plan  Stop Lisinopril HCTZ  Begin [name] HCTZ 160/12.5mg  daily  Follow med calendar closely and bring to each visit.  Continue on Aerospan 2 puffs daily  Continue on ANORO 1 puff daily  Continue on Oxygen 2l/m At bedtime   follow up Dr. Chase Caller in 3 months and As needed

## 2013-06-07 ENCOUNTER — Telehealth: Payer: Self-pay | Admitting: Internal Medicine

## 2013-06-07 NOTE — Telephone Encounter (Signed)
Patient called stating she got a prescription for Diovam and was told by phamacy not  To take since is allergic to Benicar -patient wants to know if there is an alternative med that she can  Take  -Please advise

## 2013-06-07 NOTE — Telephone Encounter (Signed)
I called and spoke with pt. Made her aware of TP recs. She will call her PCP. Nothing further needed

## 2013-06-07 NOTE — Telephone Encounter (Signed)
Spoke with the pt  She is requesting different BP med be called in  TP d/ced ACE and started [name] HCT  She is allergic to Benicar (swelling and whelps, I added to allergy list) Due to this allergy, the pharmacist does not rec that she take the diovan  TP- please advise a generic alternative per pt request  Thanks! Allergies  Allergen Reactions  . Nickel Rash    Severe rash to infection: pt is allergic to all metals other than sterling silver or gold jewelry.   [name] [Olmesartan] Swelling  . Codeine Other (See Comments)    jittery  . Monosodium Glutamate     Facial swelling per pt

## 2013-06-07 NOTE — Telephone Encounter (Signed)
Full chart review: at her last PCP physical BP well controlled. In the interval she was taken off lisinopril and ARB was prescribed - Victoria [name]. Has drug allergy to Benicar with swelling.  Plan  Diltia XT 180 mg once a day, #30, 1 refill  F/u OV for blood pressure check

## 2013-06-07 NOTE — Telephone Encounter (Signed)
Did not know she was allergic to ARB  Technically she shoulld not be on ARB or ACE with that allergy.  Would not take ARB -so d/c diovan  Can restart her  Lisinopril but will need to address with her PCP alternative as this may be making her cough worse and may need to be off d/t ARB allergy.  Please contact office for sooner follow up if symptoms do not improve or worsen or seek emergency care

## 2013-06-11 MED ORDER — DILTIAZEM HCL ER 180 MG PO CP24: 180.0000 mg | ORAL_CAPSULE | Freq: Every day | ORAL | Status: DC

## 2013-06-11 NOTE — Telephone Encounter (Signed)
Pt is calling after checking with her Pharm and the new BP med was not there.  Rx for DiltiaXT 180mg  once a day #30 called in to [facility] at [phone].  Call taken by Sherryle Lis, RN-CAN

## 2013-06-11 NOTE — Addendum Note (Signed)
Addended by: Parke Poisson E on: 06/11/2013 11:02 AM   Modules accepted: Orders

## 2013-06-20 ENCOUNTER — Encounter: Payer: Self-pay | Admitting: Internal Medicine

## 2013-06-20 ENCOUNTER — Ambulatory Visit (INDEPENDENT_AMBULATORY_CARE_PROVIDER_SITE_OTHER): Payer: Medicare HMO | Admitting: Internal Medicine

## 2013-06-20 VITALS — BP 154/78 | HR 79 | Temp 98.0°F | Wt 219.4 lb

## 2013-06-20 DIAGNOSIS — I10 Essential (primary) hypertension: Secondary | ICD-10-CM

## 2013-06-20 MED ORDER — LISINOPRIL-HYDROCHLOROTHIAZIDE 20-12.5 MG PO TABS: 1.0000 | ORAL_TABLET | Freq: Every day | ORAL | Status: DC

## 2013-06-20 NOTE — Progress Notes (Signed)
   Subjective:    Patient ID: Victoria [name], female    DOB: August 14, 1945, 68 y.o.   MRN: [id number]  HPI Victoria [name] presents medication evaluation. She was seen byb Tammy Parrett and was taken of lisinopril due to risk of possible cough, although she did not have a dry hacky cough. She was prescribed [name], but pharmacist noted she had had a reaction to benicar so she did not fill diovan. She called the office and was started on diltiazem. She developed significant peripheral edema and reports she had some shortness of breath so she stopped diltiazem and has resumed lisinopril.  Her peripheral edema did resolve. She is now asking what she should take for blood pressure control.   She is otherwise doing well. She is now on nocturnal oxygen after having low overnight oximetry. Since resuming Lisinopril she has not had a cough and she is tolerating this well.   PMH, FamHx and SocHx reviewed for any changes and relevance.  Current Outpatient Prescriptions on File Prior to Visit  Medication Sig Dispense Refill  . Flunisolide HFA (AEROSPAN) 80 MCG/ACT AERS Inhale 1 puff into the lungs 2 (two) times daily.  8.9 g  3  . Umeclidinium-Vilanterol (ANORO ELLIPTA) 62.5-25 MCG/INH AEPB Inhale 1 puff into the lungs daily.  60 each  3  . albuterol (PROVENTIL HFA;VENTOLIN HFA) 108 (90 BASE) MCG/ACT inhaler Inhale 1 puff into the lungs every 4 (four) hours as needed for wheezing or shortness of breath.      [name] amLODipine (NORVASC) 10 MG tablet Take 1 tablet (10 mg total) by mouth daily.  30 tablet  6  . aspirin 81 MG tablet Take 81 mg by mouth daily.        . Cholecalciferol (VITAMIN D-3) 5000 UNITS TABS Take 1 tablet by mouth every morning.      . diltiazem (DILACOR XR) 180 MG 24 hr capsule Take 1 capsule (180 mg total) by mouth daily.  30 capsule  1  . lovastatin (MEVACOR) 20 MG tablet Take 1 tablet (20 mg total) by mouth daily.  30 tablet  6   No current facility-administered medications on file prior to  visit.      Review of Systems System review is negative for any constitutional, cardiac, pulmonary, GI or neuro symptoms or complaints other than as described in the HPI.     Objective:   Physical Exam Filed Vitals:   06/20/13 0826  BP: 154/78  Pulse: 79  Temp: 98 F (36.7 C)   Repeat BP 140/82 Gen'l- overweight woman in no distress Cor - RRR Pulm - good breath sounds - no wheezing, no cough Neuor - A&O x 3        Assessment & Plan:

## 2013-06-20 NOTE — Patient Instructions (Signed)
Good to see you. Sorry about the confusion with the BP meds.  Plan Fine to resume the lisinopril/Hct - you have tolerated it well in the past and should have no problems.

## 2013-06-20 NOTE — Assessment & Plan Note (Signed)
Change in BP meds was unsuccessful with multiple complications. She is now back on ACE-I (lisinopril/hct) and tolerating this well with adequate control of BP.

## 2013-06-20 NOTE — Progress Notes (Signed)
Pre visit review using our clinic review tool, if applicable. No additional management support is needed unless otherwise documented below in the visit note. 

## 2013-07-04 ENCOUNTER — Other Ambulatory Visit: Payer: Self-pay | Admitting: Internal Medicine

## 2013-07-05 ENCOUNTER — Telehealth: Payer: Self-pay | Admitting: Internal Medicine

## 2013-07-05 MED ORDER — FLUTICASONE PROPIONATE HFA 44 MCG/ACT IN AERO: 2.0000 | INHALATION_SPRAY | Freq: Two times a day (BID) | RESPIRATORY_TRACT | Status: DC

## 2013-07-05 NOTE — Telephone Encounter (Signed)
LMTCB

## 2013-07-05 NOTE — Telephone Encounter (Signed)
Pt returned call.  Spoke with patient who verified that her insurance company does not cover [employer] (she stated that [facility] had mis-informed her originally when she inquired about her original rx by TP).  The covered alternatives are Asmanex, Flovent and QVAR.  Per pt, the Flovent has the cheapest copay $3.36.  Dr Chase Caller please advise, thank you.  **Pt also stated that she was told by [place] that the Anoro has 2 foil strips of medication, the inhaler has 1 and she would need to load the second in order to equal 60 puffs.  ???  Pt is aware that the inhaler is "self-containing" and that she does NOT need to load any medication into the Anoro inhaler.  Advised pt would still call [facility] to clarify.  [employer], spoke with pharm tech Janett Billow who explained the same to me as she did the patient.  Tried to explain to [place] that Anoro already contains both medications in the foil strips inside the inhaler - the patient does not load anything.  This was difficult for her understand.  Called the patient back and verified with her about proper use of the medication.

## 2013-07-05 NOTE — Telephone Encounter (Signed)
Spoke with the pt and notified of recs per MR Rx for flovent was sent to the pharm Nothing further needed per pt

## 2013-07-05 NOTE — Telephone Encounter (Signed)
Ok go with low dose flovent ? 91mcg or next higher; 2 puff twice daily  Dr. Brand Males, M.D., [facility].C.P Pulmonary and Critical Care Medicine Staff Physician [place] [facility] Pager: [phone], If no answer or between  15:00h - 7:00h: call [phone]  07/05/2013 12:42 PM

## 2013-08-02 ENCOUNTER — Ambulatory Visit (INDEPENDENT_AMBULATORY_CARE_PROVIDER_SITE_OTHER): Payer: Medicare HMO | Admitting: Internal Medicine

## 2013-08-02 ENCOUNTER — Encounter: Payer: Self-pay | Admitting: Internal Medicine

## 2013-08-02 VITALS — BP 158/80 | HR 77 | Temp 97.3°F | Wt 221.0 lb

## 2013-08-02 DIAGNOSIS — E669 Obesity, unspecified: Secondary | ICD-10-CM

## 2013-08-02 DIAGNOSIS — I10 Essential (primary) hypertension: Secondary | ICD-10-CM

## 2013-08-02 MED ORDER — LISINOPRIL-HYDROCHLOROTHIAZIDE 20-12.5 MG PO TABS: 1.0000 | ORAL_TABLET | Freq: Every day | ORAL | Status: DC

## 2013-08-02 MED ORDER — FLUTICASONE PROPIONATE HFA 44 MCG/ACT IN AERO: 2.0000 | INHALATION_SPRAY | Freq: Two times a day (BID) | RESPIRATORY_TRACT | Status: DC

## 2013-08-02 MED ORDER — LOVASTATIN 20 MG PO TABS: 20.0000 mg | ORAL_TABLET | Freq: Every day | ORAL | Status: DC

## 2013-08-02 MED ORDER — AMLODIPINE BESYLATE 10 MG PO TABS: 10.0000 mg | ORAL_TABLET | Freq: Every day | ORAL | Status: DC

## 2013-08-02 MED ORDER — ALBUTEROL SULFATE HFA 108 (90 BASE) MCG/ACT IN AERS: INHALATION_SPRAY | RESPIRATORY_TRACT | Status: DC

## 2013-08-02 NOTE — Progress Notes (Signed)
Pre visit review using our clinic review tool, if applicable. No additional management support is needed unless otherwise documented below in the visit note. 

## 2013-08-02 NOTE — Progress Notes (Signed)
   Subjective:    Patient ID: Victoria [name], female    DOB: April 14, 1946, 68 y.o.   MRN: [id number]  HPI Victoria [name] presents for BP follow up. She reports BP at home is consistently less than SBP 150 and DBP less than 90.  Discussed weight management.   PMH, FamHx and SocHx reviewed for any changes and relevance.  Current Outpatient Prescriptions on File Prior to Visit  Medication Sig Dispense Refill  . amLODipine (NORVASC) 10 MG tablet Take 1 tablet (10 mg total) by mouth daily.  30 tablet  6  . aspirin 81 MG tablet Take 81 mg by mouth daily.        . Cholecalciferol (VITAMIN D-3) 5000 UNITS TABS Take 1 tablet by mouth every morning.      . fluticasone (FLOVENT HFA) 44 MCG/ACT inhaler Inhale 2 puffs into the lungs 2 (two) times daily.  1 Inhaler  5  . lisinopril-hydrochlorothiazide (PRINZIDE,ZESTORETIC) 20-12.5 MG per tablet Take 1 tablet by mouth daily.  30 tablet  6  . lovastatin (MEVACOR) 20 MG tablet Take 1 tablet (20 mg total) by mouth daily.  30 tablet  6  . Umeclidinium-Vilanterol (ANORO ELLIPTA) 62.5-25 MCG/INH AEPB Inhale 1 puff into the lungs daily.  60 each  3  . VENTOLIN HFA 108 (90 BASE) MCG/ACT inhaler INHALE ONE PUFF EVERY 6 HOURS AS NEEDED FOR WHEEZING  18 each  0   No current facility-administered medications on file prior to visit.      Review of Systems System review is negative for any constitutional, cardiac, pulmonary, GI or neuro symptoms or complaints other than as described in the HPI.     Objective:   Physical Exam Filed Vitals:   08/02/13 0950  BP: 158/80  Pulse: 77  Temp: 97.3 F (36.3 C)   Wt Readings from Last 3 Encounters:  08/02/13 221 lb (100.245 kg)  06/20/13 219 lb 6.4 oz (99.519 kg)  06/04/13 217 lb 9.6 oz (98.703 kg)   BP Readings from Last 3 Encounters:  08/02/13 158/80  06/20/13 154/78  06/04/13 116/68   Gen'l - WNWD woman in no distress Cor - 2+ radial, RRR Pul - CTAP Neuro -  A&O x 3      Assessment & Plan:

## 2013-08-02 NOTE — Patient Instructions (Signed)
Thanks for coming to see me.  Blood pressure - a little high today at 158/80. The goal is a systolic pressure of 098 or less [facility] recommendations).  Continue you present medications.  Weight management: Diet management: smart food choices, PORTION SIZE CONTROL, regular exercise. Goal - to loose 1-2 lbs.month. Target weight - 180 lbs (41 lbs over 2-4 years). You will see benefit much sooner and before you reach goal. Remember the meal of 1,000 chews.  For diet/food information go to [email or web address].

## 2013-08-03 ENCOUNTER — Telehealth: Payer: Self-pay | Admitting: Internal Medicine

## 2013-08-03 NOTE — Telephone Encounter (Signed)
Relevant patient education mailed to patient.  

## 2013-08-04 NOTE — Assessment & Plan Note (Signed)
Weight management: Diet management: smart food choices, PORTION SIZE CONTROL, regular exercise. Goal - to loose 1-2 lbs.month. Target weight - 180 lbs (41 lbs over 2-4 years). You will see benefit much sooner and before you reach goal. Remember the meal of 1,000 chews.  For diet/food information go to [email or web address].

## 2013-08-04 NOTE — Assessment & Plan Note (Signed)
BP Readings from Last 3 Encounters:  08/02/13 158/80  06/20/13 154/78  06/04/13 116/68   Blood pressure - a little high today at 158/80. The goal is a systolic pressure of 347 or less [facility] recommendations).  Plan Continue you present medications.  Monitor BP at home and provide report back.

## 2013-08-27 ENCOUNTER — Encounter: Payer: Self-pay | Admitting: Internal Medicine

## 2013-08-27 ENCOUNTER — Ambulatory Visit (INDEPENDENT_AMBULATORY_CARE_PROVIDER_SITE_OTHER): Payer: Commercial Managed Care - HMO | Admitting: Internal Medicine

## 2013-08-27 VITALS — BP 128/80 | HR 84 | Ht 65.5 in | Wt 220.8 lb

## 2013-08-27 DIAGNOSIS — J449 Chronic obstructive pulmonary disease, unspecified: Secondary | ICD-10-CM | POA: Diagnosis not present

## 2013-08-27 DIAGNOSIS — J4489 Other specified chronic obstructive pulmonary disease: Secondary | ICD-10-CM | POA: Diagnosis not present

## 2013-08-27 NOTE — Patient Instructions (Addendum)
COPD is currently stable disease Cotninue  Lisinopril HCTZ  For now but at future visit will work on stopping it I have  ensured [name] is on allergy list Follow med calendar closely and bring to each visit; take it back from my CMA Continue on Flovent 2 puff twice daily Continue on ANORO 1 puff daily  Continue on Oxygen 2l/m At bedtime   follow up Dr. Chase Caller in 6 months and As needed    - flu shot in fall  - at followup discuss lung cancer screening  - at followup discuss coming off nocturnal O2; she is keen on this

## 2013-08-27 NOTE — Progress Notes (Signed)
Subjective:    Patient ID: Victoria [name], female    DOB: 10-30-1945, 68 y.o.   MRN: [id number]  HPI   #COPD/Asthma  - 50 pack smoker, quit Jan 2012 -   CXR 04/18/11  -copd, lingular atelectasis - PFT 06/19/11 (done on advair):  - fev1 0.97L/43%, Ratio 43, TLC 5L/104%, DLCO 12.3/51%, No BD response - CAT score 7 - May 2012 - Rx spiriva and advair - Alpha 1 11/23/2011:    #Obesity  Body mass index is 34.61 kg/(m^2). 08/05/11 Body mass index is 33.76 kg/(m^2). - 09/15/2011 Body mass index is 35.46 kg/(m^2). - 11/23/2011 Body mass index is 35.38 kg/(m^2). on 04/23/2013  #Lung cancer screening  - not done as of 08/27/2013   HPI     OV 07/03/2012 COPD/asthma. She is doing well in January 2014 she thinks she had the influenza although this was not confirmed but symptoms were consistent with that. She took 2 weeks of work and now she is feeling completely fine. COPD cat score is 14 which is his baseline although it is elevated compared to 6 at prior visit. No new issues  She struggling with weight loss particularly  With  Discipline at end of the day    OV 04/23/2013  Chief Complaint  Patient presents with  . Follow-up    Pt c/o SOB with exertion, occasional chest tightness.   Followup Gold stage III COPD  Last seen 9 months ago. Overall COPD stable. COPD cat score is unchanged and 13 but she is having significant dyspnea on exertion. 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 Other than that she does continue to have stable dyspnea on exertion associated with wheeze. The severity of cough is extremely mild even though she is on ACE inhibitor. She is also having difficulty affording her inhalers Spiriva and Advair. She ran out of these inhalers for a few days when she hit the donut hole and this made her dyspnea worse. No associated sputum of COPD exacerbation.  She's never attended pulmonary rehabilitation due to  logistical problems which is open to trying it again.  Is not tested of oxygen levels in a long time   She's not lost any weight  Noted she is on ACE inhibitor; she is reluctant to chang   REC #ASthma/copd   - Glad you are up-to-date with a vaccine - COPD is stable but I do understand that he has significant shortness of breath - Stop Spiriva and Advair - Start ANORO 1 puff daily and AEROSPAN 2 puff twice dailuy - Albuterol as needed - Will refer to pulmonary rehab again - Test you for overnight oxygen status - Based on above, consider dc lisinopril  #TB screening  - CXR today per employer  request  #FOllowup  -4 weeks with NP Tammy for medicine calendar - at ollowup repeat spirometry  To be considered - come sooner if there are problems     06/04/2013 Follow up and Med Review  Returns for follow up and med review .  We reviewed all her meds and organized them into a med calendar. Appears she is taking her meds correctly.  Reports breathing is doing well, no new complaints.   Anoro and Jeralyn Ruths are working well Last ov , Stop Spiriva and Advair, Rx  ANORO and AEROSPAN  On ACE inhibitor. She does has hx of recurrent cough.  ONO last ov ,showed desats  nocturnally. Started on O2 At bedtime  At 2 l/m .  She does feel that her breathing has improved on her current regimen. Denies any hemoptysis, orthopnea, PND, or leg swellin  REC Stop Lisinopril HCTZ  Begin [name] HCTZ 160/12.5mg  daily  Follow med calendar closely and bring to each visit.  Continue on Aerospan 2 puffs daily  Continue on ANORO 1 puff daily  Continue on Oxygen 2l/m At bedtime   follow up Dr. Chase Caller in 3 months and As needed     OV 08/27/2013  Chief Complaint  Patient presents with  . COPD    follow-up. Pt states she  still has SOB with exertion but this is not any worse.     At last visit in January 2015 with my nurse practitioner medication changes were done and a medicine calendar was done. She  now knows of COPD inhalers. Her ACE inhibitor was changed to an ARB but she called back saying that she was allergic to the ARB and she describes facial swelling which is very reminiscent of an ACE inhibitor allergy but she insisted she is allergic to the ARB. In fact she is back on her ACE inhibitor lisinopril and she's not having any problems. Has still had a hard time convincing her to come off the ACE inhibitor. Nevertheless currently she is on anoro and flovent (Aerospan was too expensive] in this regimen is working well for her. COPD cat score is 13. She cannot do pulmonary rehabilitation because of her work schedule. At some point in time in the future she is keen on retesting her overnight oxygen because she wants to come off it  CAT COPD Symptom and Quality of Life Score ([employer])   11/23/11 07/03/2012  04/23/2013   Never Cough -> Cough all the time 1 2 1   No phlegm in chest -> Chest is full of phlegm 0 1 1  No chest tightness -> Chest feels very tight 0 2 2  No dyspnea for 1 flight stairs/hill -> Very dyspneic for 1 flight of stairs 1 3 4   No limitations for ADL at home -> Very limited with ADL at home 1 2 3   Confident leaving home -> Not at all confident leaving home 0 0 0  Sleep soundly -> Do not sleep soundly because of lung condition 1 1 0  Lots of Energy -> No energy at all 2 3 2   TOTAL Score (max 40)  6 14 13          Past medical and social history: Reviewed. She continues to work and therefore has difficulty attending pulmonary rehabilitation. She's had no new medical problems. Issue of lung cancer screening has not yet been discussed with her.   Review of Systems  Constitutional: Negative for fever and unexpected weight change.  HENT: Negative for congestion, dental problem, ear pain, nosebleeds, postnasal drip, rhinorrhea, sinus pressure, sneezing, sore throat and trouble swallowing.   Eyes: Negative for redness and itching.  Respiratory: Positive for  shortness of breath. Negative for cough, chest tightness and wheezing.   Cardiovascular: Negative for palpitations and leg swelling.  Gastrointestinal: Negative for nausea and vomiting.  Genitourinary: Negative for dysuria.  Musculoskeletal: Negative for joint swelling.  Skin: Negative for rash.  Neurological: Negative for headaches.  Hematological: Does not bruise/bleed easily.  Psychiatric/Behavioral: Negative for dysphoric mood. The patient is not nervous/anxious.        Objective:   Physical Exam  Vitals reviewed. Constitutional: She is oriented to  person, place, and time. She appears well-developed and well-nourished. No distress.  Body mass index is 36.17 kg/(m^2). obese  HENT:  Head: Normocephalic and atraumatic.  Right Ear: External ear normal.  Left Ear: External ear normal.  Mouth/Throat: Oropharynx is clear and moist. No oropharyngeal exudate.  Eyes: Conjunctivae and EOM are normal. Pupils are equal, round, and reactive to light. Right eye exhibits no discharge. Left eye exhibits no discharge. No scleral icterus.  Neck: Normal range of motion. Neck supple. No JVD present. No tracheal deviation present. No thyromegaly present.  Cardiovascular: Normal rate, regular rhythm, normal heart sounds and intact distal pulses.  Exam reveals no gallop and no friction rub.   No murmur heard. Pulmonary/Chest: Effort normal and breath sounds normal. No respiratory distress. She has no wheezes. She has no rales. She exhibits no tenderness.  Abdominal: Soft. Bowel sounds are normal. She exhibits no distension and no mass. There is no tenderness. There is no rebound and no guarding.  Musculoskeletal: Normal range of motion. She exhibits no edema and no tenderness.  Lymphadenopathy:    She has no cervical adenopathy.  Neurological: She is alert and oriented to person, place, and time. She has normal reflexes. No cranial nerve deficit. She exhibits normal muscle tone. Coordination normal.   Skin: Skin is warm and dry. No rash noted. She is not diaphoretic. No erythema. No pallor.  Psychiatric: She has a normal mood and affect. Her behavior is normal. Judgment and thought content normal.          Assessment & Plan:

## 2013-09-02 NOTE — Assessment & Plan Note (Signed)
COPD is currently stable disease Cotninue  Lisinopril HCTZ  For now but at future visit will work on stopping it I have  ensured [name] is on allergy list Follow med calendar closely and bring to each visit; take it back from my CMA Continue on Flovent 2 puff twice daily Continue on ANORO 1 puff daily  Continue on Oxygen 2l/m At bedtime   follow up Dr. Shalina Norfolk in 6 months and As needed    - flu shot in fall  - at followup discuss lung cancer screening  - at followup discuss coming off nocturnal O2; she is keen on this   

## 2013-09-24 ENCOUNTER — Telehealth: Payer: Self-pay

## 2013-09-24 NOTE — Telephone Encounter (Signed)
Patient called requesting samples of amlodipine, lovastatin, and lisinopril. I advised that that two of them are on the [facility] 2$ list and will ask if there is something comparable to the amlodipine. Pt has been scheduled to see Dr. Ronnald Ramp on 02/04/14 and would like to know if there is anything here in samples that may help her until see is able to afford medication. Thanks

## 2013-09-24 NOTE — Telephone Encounter (Signed)
No, we don't have any samples for this

## 2013-11-07 ENCOUNTER — Other Ambulatory Visit: Payer: Self-pay | Admitting: Adult Health

## 2013-12-07 ENCOUNTER — Telehealth: Payer: Self-pay | Admitting: Internal Medicine

## 2013-12-07 MED ORDER — FLUTICASONE PROPIONATE HFA 44 MCG/ACT IN AERO: 2.0000 | INHALATION_SPRAY | Freq: Two times a day (BID) | RESPIRATORY_TRACT | Status: DC

## 2013-12-07 MED ORDER — UMECLIDINIUM-VILANTEROL 62.5-25 MCG/INH IN AEPB
INHALATION_SPRAY | RESPIRATORY_TRACT | Status: DC
Start: 2013-12-07 — End: 2014-02-11

## 2013-12-07 NOTE — Telephone Encounter (Signed)
Refill sent and pt is aware. Afia Messenger, CMA  

## 2014-02-04 ENCOUNTER — Encounter: Payer: Medicare HMO | Admitting: Physician Assistant

## 2014-02-04 ENCOUNTER — Encounter: Payer: Medicare HMO | Admitting: Internal Medicine

## 2014-02-08 ENCOUNTER — Encounter: Payer: Medicare HMO | Admitting: Internal Medicine

## 2014-02-11 ENCOUNTER — Encounter: Payer: Self-pay | Admitting: Internal Medicine

## 2014-02-11 ENCOUNTER — Telehealth: Payer: Self-pay | Admitting: Internal Medicine

## 2014-02-11 ENCOUNTER — Other Ambulatory Visit (INDEPENDENT_AMBULATORY_CARE_PROVIDER_SITE_OTHER): Payer: Commercial Managed Care - HMO

## 2014-02-11 ENCOUNTER — Ambulatory Visit (INDEPENDENT_AMBULATORY_CARE_PROVIDER_SITE_OTHER): Payer: Commercial Managed Care - HMO | Admitting: Internal Medicine

## 2014-02-11 VITALS — BP 150/62 | HR 88 | Temp 98.5°F | Resp 16 | Ht 65.5 in | Wt 219.8 lb

## 2014-02-11 DIAGNOSIS — E66811 Obesity, class 1: Secondary | ICD-10-CM

## 2014-02-11 DIAGNOSIS — Z0000 Encounter for general adult medical examination without abnormal findings: Secondary | ICD-10-CM

## 2014-02-11 DIAGNOSIS — E785 Hyperlipidemia, unspecified: Secondary | ICD-10-CM

## 2014-02-11 DIAGNOSIS — E669 Obesity, unspecified: Secondary | ICD-10-CM

## 2014-02-11 DIAGNOSIS — R9431 Abnormal electrocardiogram [ECG] [EKG]: Secondary | ICD-10-CM

## 2014-02-11 DIAGNOSIS — M1710 Unilateral primary osteoarthritis, unspecified knee: Secondary | ICD-10-CM

## 2014-02-11 DIAGNOSIS — M810 Age-related osteoporosis without current pathological fracture: Secondary | ICD-10-CM

## 2014-02-11 DIAGNOSIS — R0602 Shortness of breath: Secondary | ICD-10-CM

## 2014-02-11 DIAGNOSIS — R748 Abnormal levels of other serum enzymes: Secondary | ICD-10-CM

## 2014-02-11 DIAGNOSIS — M179 Osteoarthritis of knee, unspecified: Secondary | ICD-10-CM

## 2014-02-11 DIAGNOSIS — I10 Essential (primary) hypertension: Secondary | ICD-10-CM

## 2014-02-11 LAB — COMPREHENSIVE METABOLIC PANEL
ALBUMIN: 3.9 g/dL (ref 3.5–5.2)
ALK PHOS: 100 U/L (ref 39–117)
ALT: 5 U/L (ref 0–35)
AST: 16 U/L (ref 0–37)
BILIRUBIN TOTAL: 0.3 mg/dL (ref 0.2–1.2)
BUN: 14 mg/dL (ref 6–23)
CO2: 26 mEq/L (ref 19–32)
Calcium: 9.3 mg/dL (ref 8.4–10.5)
Chloride: 102 mEq/L (ref 96–112)
Creatinine, Ser: 0.9 mg/dL (ref 0.4–1.2)
GFR: 79.6 mL/min (ref >60.00)
Glucose, Bld: 121 mg/dL — ABNORMAL HIGH (ref 70–99)
POTASSIUM: 3.4 meq/L — AB (ref 3.5–5.1)
SODIUM: 136 meq/L (ref 135–145)
TOTAL PROTEIN: 7 g/dL (ref 6.0–8.3)

## 2014-02-11 LAB — CBC WITH DIFFERENTIAL/PLATELET
BASOS PCT: 0.6 % (ref 0.0–3.0)
Basophils Absolute: 0.1 10*3/uL (ref 0.0–0.1)
EOS ABS: 0.2 10*3/uL (ref 0.0–0.7)
EOS PCT: 2.5 % (ref 0.0–5.0)
HEMATOCRIT: 40.8 % (ref 36.0–46.0)
Hemoglobin: 13.7 g/dL (ref 12.0–15.0)
LYMPHS ABS: 3 10*3/uL (ref 0.7–4.0)
Lymphocytes Relative: 31.2 % (ref 12.0–46.0)
MCHC: 33.5 g/dL (ref 30.0–36.0)
MCV: 86.6 fl (ref 78.0–100.0)
MONO ABS: 0.7 10*3/uL (ref 0.1–1.0)
Monocytes Relative: 7.7 % (ref 3.0–12.0)
NEUTROS PCT: 58 % (ref 43.0–77.0)
Neutro Abs: 5.6 10*3/uL (ref 1.4–7.7)
PLATELETS: 253 10*3/uL (ref 150.0–400.0)
RBC: 4.7 Mil/uL (ref 3.87–5.11)
RDW: 14.1 % (ref 11.5–15.5)
WBC: 9.6 10*3/uL (ref 4.0–10.5)

## 2014-02-11 LAB — LIPID PANEL
CHOL/HDL RATIO: 4
Cholesterol: 193 mg/dL (ref 0–200)
HDL: 48.1 mg/dL (ref >39.00)
NONHDL: 144.9
Triglycerides: 429 mg/dL — ABNORMAL HIGH (ref 0.0–149.0)
VLDL: 85.8 mg/dL — AB (ref 0.0–40.0)

## 2014-02-11 MED ORDER — UMECLIDINIUM-VILANTEROL 62.5-25 MCG/INH IN AEPB: INHALATION_SPRAY | RESPIRATORY_TRACT | Status: DC

## 2014-02-11 NOTE — Patient Instructions (Signed)
Hypertension Hypertension, commonly called high blood pressure, is when the force of blood pumping through your arteries is too strong. Your arteries are the blood vessels that carry blood from your heart throughout your body. A blood pressure reading consists of a higher number over a lower number, such as 110/72. The higher number (systolic) is the pressure inside your arteries when your heart pumps. The lower number (diastolic) is the pressure inside your arteries when your heart relaxes. Ideally you want your blood pressure below 120/80. Hypertension forces your heart to work harder to pump blood. Your arteries may become narrow or stiff. Having hypertension puts you at risk for heart disease, stroke, and other problems.  RISK FACTORS Some risk factors for high blood pressure are controllable. Others are not.  Risk factors you cannot control include:   Race. You may be at higher risk if you are African American.  Age. Risk increases with age.  Gender. Men are at higher risk than women before age 45 years. After age 65, women are at higher risk than men. Risk factors you can control include:  Not getting enough exercise or physical activity.  Being overweight.  Getting too much fat, sugar, calories, or salt in your diet.  Drinking too much alcohol. SIGNS AND SYMPTOMS Hypertension does not usually cause signs or symptoms. Extremely high blood pressure (hypertensive crisis) may cause headache, anxiety, shortness of breath, and nosebleed. DIAGNOSIS  To check if you have hypertension, your health care provider will measure your blood pressure while you are seated, with your arm held at the level of your heart. It should be measured at least twice using the same arm. Certain conditions can cause a difference in blood pressure between your right and left arms. A blood pressure reading that is higher than normal on one occasion does not mean that you need treatment. If one blood pressure reading  is high, ask your health care provider about having it checked again. TREATMENT  Treating high blood pressure includes making lifestyle changes and possibly taking medicine. Living a healthy lifestyle can help lower high blood pressure. You may need to change some of your habits. Lifestyle changes may include:  Following the DASH diet. This diet is high in fruits, vegetables, and whole grains. It is low in salt, red meat, and added sugars.  Getting at least 2 hours of brisk physical activity every week.  Losing weight if necessary.  Not smoking.  Limiting alcoholic beverages.  Learning ways to reduce stress. If lifestyle changes are not enough to get your blood pressure under control, your health care provider may prescribe medicine. You may need to take more than one. Work closely with your health care provider to understand the risks and benefits. HOME CARE INSTRUCTIONS  Have your blood pressure rechecked as directed by your health care provider.   Take medicines only as directed by your health care provider. Follow the directions carefully. Blood pressure medicines must be taken as prescribed. The medicine does not work as well when you skip doses. Skipping doses also puts you at risk for problems.   Do not smoke.   Monitor your blood pressure at home as directed by your health care provider. SEEK MEDICAL CARE IF:   You think you are having a reaction to medicines taken.  You have recurrent headaches or feel dizzy.  You have swelling in your ankles.  You have trouble with your vision. SEEK IMMEDIATE MEDICAL CARE IF:  You develop a severe headache or confusion.    You have unusual weakness, numbness, or feel faint.  You have severe chest or abdominal pain.  You vomit repeatedly.  You have trouble breathing. MAKE SURE YOU:   Understand these instructions.  Will watch your condition.  Will get help right away if you are not doing well or get worse. Document  Released: 05/03/2005 Document Revised: 09/17/2013 Document Reviewed: 02/23/2013 [facility] Patient Information 2015 [place], [place]. This information is not intended to replace advice given to you by your health care provider. Make sure you discuss any questions you have with your health care provider. Preventive Care for Adults A healthy lifestyle and preventive care can promote health and wellness. Preventive health guidelines for women include the following key practices.  A routine yearly physical is a good way to check with your health care provider about your health and preventive screening. It is a chance to share any concerns and updates on your health and to receive a thorough exam.  Visit your dentist for a routine exam and preventive care every 6 months. Brush your teeth twice a day and floss once a day. Good oral hygiene prevents tooth decay and gum disease.  The frequency of eye exams is based on your age, health, family medical history, use of contact lenses, and other factors. Follow your health care provider's recommendations for frequency of eye exams.  Eat a healthy diet. Foods like vegetables, fruits, whole grains, low-fat dairy products, and lean protein foods contain the nutrients you need without too many calories. Decrease your intake of foods high in solid fats, added sugars, and salt. Eat the right amount of calories for you.Get information about a proper diet from your health care provider, if necessary.  Regular physical exercise is one of the most important things you can do for your health. Most adults should get at least 150 minutes of moderate-intensity exercise (any activity that increases your heart rate and causes you to sweat) each week. In addition, most adults need muscle-strengthening exercises on 2 or more days a week.  Maintain a healthy weight. The body mass index (BMI) is a screening tool to identify possible weight problems. It provides an estimate of body fat  based on height and weight. Your health care provider can find your BMI and can help you achieve or maintain a healthy weight.For adults 20 years and older:  A BMI below 18.5 is considered underweight.  A BMI of 18.5 to 24.9 is normal.  A BMI of 25 to 29.9 is considered overweight.  A BMI of 30 and above is considered obese.  Maintain normal blood lipids and cholesterol levels by exercising and minimizing your intake of saturated fat. Eat a balanced diet with plenty of fruit and vegetables. Blood tests for lipids and cholesterol should begin at age 43 and be repeated every 5 years. If your lipid or cholesterol levels are high, you are over 50, or you are at high risk for heart disease, you may need your cholesterol levels checked more frequently.Ongoing high lipid and cholesterol levels should be treated with medicines if diet and exercise are not working.  If you smoke, find out from your health care provider how to quit. If you do not use tobacco, do not start.  Lung cancer screening is recommended for adults aged 21-80 years who are at high risk for developing lung cancer because of a history of smoking. A yearly low-dose CT scan of the lungs is recommended for people who have at least a 30-pack-year history of smoking  and are a current smoker or have quit within the past 15 years. A pack year of smoking is smoking an average of 1 pack of cigarettes a day for 1 year (for example: 1 pack a day for 30 years or 2 packs a day for 15 years). Yearly screening should continue until the smoker has stopped smoking for at least 15 years. Yearly screening should be stopped for people who develop a health problem that would prevent them from having lung cancer treatment.  If you are pregnant, do not drink alcohol. If you are breastfeeding, be very cautious about drinking alcohol. If you are not pregnant and choose to drink alcohol, do not have more than 1 drink per day. One drink is considered to be 12  ounces (355 mL) of beer, 5 ounces (148 mL) of wine, or 1.5 ounces (44 mL) of liquor.  Avoid use of street drugs. Do not share needles with anyone. Ask for help if you need support or instructions about stopping the use of drugs.  High blood pressure causes heart disease and increases the risk of stroke. Your blood pressure should be checked at least every 1 to 2 years. Ongoing high blood pressure should be treated with medicines if weight loss and exercise do not work.  If you are 54-39 years old, ask your health care provider if you should take aspirin to prevent strokes.  Diabetes screening involves taking a blood sample to check your fasting blood sugar level. This should be done once every 3 years, after age 66, if you are within normal weight and without risk factors for diabetes. Testing should be considered at a younger age or be carried out more frequently if you are overweight and have at least 1 risk factor for diabetes.  Breast cancer screening is essential preventive care for women. You should practice "breast self-awareness." This means understanding the normal appearance and feel of your breasts and may include breast self-examination. Any changes detected, no matter how small, should be reported to a health care provider. Women in their 71s and 30s should have a clinical breast exam (CBE) by a health care provider as part of a regular health exam every 1 to 3 years. After age 42, women should have a CBE every year. Starting at age 47, women should consider having a mammogram (breast X-ray test) every year. Women who have a family history of breast cancer should talk to their health care provider about genetic screening. Women at a high risk of breast cancer should talk to their health care providers about having an MRI and a mammogram every year.  Breast cancer gene (BRCA)-related cancer risk assessment is recommended for women who have family members with BRCA-related cancers.  BRCA-related cancers include breast, ovarian, tubal, and peritoneal cancers. Having family members with these cancers may be associated with an increased risk for harmful changes (mutations) in the breast cancer genes BRCA1 and BRCA2. Results of the assessment will determine the need for genetic counseling and BRCA1 and BRCA2 testing.  Routine pelvic exams to screen for cancer are no longer recommended for nonpregnant women who are considered low risk for cancer of the pelvic organs (ovaries, uterus, and vagina) and who do not have symptoms. Ask your health care provider if a screening pelvic exam is right for you.  If you have had past treatment for cervical cancer or a condition that could lead to cancer, you need Pap tests and screening for cancer for at least 20 years after  your treatment. If Pap tests have been discontinued, your risk factors (such as having a new sexual partner) need to be reassessed to determine if screening should be resumed. Some women have medical problems that increase the chance of getting cervical cancer. In these cases, your health care provider may recommend more frequent screening and Pap tests.  The HPV test is an additional test that may be used for cervical cancer screening. The HPV test looks for the virus that can cause the cell changes on the cervix. The cells collected during the Pap test can be tested for HPV. The HPV test could be used to screen women aged 71 years and older, and should be used in women of any age who have unclear Pap test results. After the age of 78, women should have HPV testing at the same frequency as a Pap test.  Colorectal cancer can be detected and often prevented. Most routine colorectal cancer screening begins at the age of 62 years and continues through age 65 years. However, your health care provider may recommend screening at an earlier age if you have risk factors for colon cancer. On a yearly basis, your health care provider may  provide home test kits to check for hidden blood in the stool. Use of a small camera at the end of a tube, to directly examine the colon (sigmoidoscopy or colonoscopy), can detect the earliest forms of colorectal cancer. Talk to your health care provider about this at age 66, when routine screening begins. Direct exam of the colon should be repeated every 5-10 years through age 5 years, unless early forms of pre-cancerous polyps or small growths are found.  People who are at an increased risk for hepatitis B should be screened for this virus. You are considered at high risk for hepatitis B if:  You were born in a country where hepatitis B occurs often. Talk with your health care provider about which countries are considered high risk.  Your parents were born in a high-risk country and you have not received a shot to protect against hepatitis B (hepatitis B vaccine).  You have HIV or AIDS.  You use needles to inject street drugs.  You live with, or have sex with, someone who has hepatitis B.  You get hemodialysis treatment.  You take certain medicines for conditions like cancer, organ transplantation, and autoimmune conditions.  Hepatitis C blood testing is recommended for all people born from [birth year] through 1967 and any individual with known risks for hepatitis C.  Practice safe sex. Use condoms and avoid high-risk sexual practices to reduce the spread of sexually transmitted infections (STIs). STIs include gonorrhea, chlamydia, syphilis, trichomonas, herpes, HPV, and human immunodeficiency virus (HIV). Herpes, HIV, and HPV are viral illnesses that have no cure. They can result in disability, cancer, and death.  You should be screened for sexually transmitted illnesses (STIs) including gonorrhea and chlamydia if:  You are sexually active and are younger than 24 years.  You are older than 24 years and your health care provider tells you that you are at risk for this type of  infection.  Your sexual activity has changed since you were last screened and you are at an increased risk for chlamydia or gonorrhea. Ask your health care provider if you are at risk.  If you are at risk of being infected with HIV, it is recommended that you take a prescription medicine daily to prevent HIV infection. This is called preexposure prophylaxis (PrEP). You are considered  at risk if:  You are a heterosexual woman, are sexually active, and are at increased risk for HIV infection.  You take drugs by injection.  You are sexually active with a partner who has HIV.  Talk with your health care provider about whether you are at high risk of being infected with HIV. If you choose to begin PrEP, you should first be tested for HIV. You should then be tested every 3 months for as long as you are taking PrEP.  Osteoporosis is a disease in which the bones lose minerals and strength with aging. This can result in serious bone fractures or breaks. The risk of osteoporosis can be identified using a bone density scan. Women ages 16 years and over and women at risk for fractures or osteoporosis should discuss screening with their health care providers. Ask your health care provider whether you should take a calcium supplement or vitamin D to reduce the rate of osteoporosis.  Menopause can be associated with physical symptoms and risks. Hormone replacement therapy is available to decrease symptoms and risks. You should talk to your health care provider about whether hormone replacement therapy is right for you.  Use sunscreen. Apply sunscreen liberally and repeatedly throughout the day. You should seek shade when your shadow is shorter than you. Protect yourself by wearing long sleeves, pants, a wide-brimmed hat, and sunglasses year round, whenever you are outdoors.  Once a month, do a whole body skin exam, using a mirror to look at the skin on your back. Tell your health care provider of new moles,  moles that have irregular borders, moles that are larger than a pencil eraser, or moles that have changed in shape or color.  Stay current with required vaccines (immunizations).  Influenza vaccine. All adults should be immunized every year.  Tetanus, diphtheria, and acellular pertussis (Td, Tdap) vaccine. Pregnant women should receive 1 dose of Tdap vaccine during each pregnancy. The dose should be obtained regardless of the length of time since the last dose. Immunization is preferred during the 27th-36th week of gestation. An adult who has not previously received Tdap or who does not know her vaccine status should receive 1 dose of Tdap. This initial dose should be followed by tetanus and diphtheria toxoids (Td) booster doses every 10 years. Adults with an unknown or incomplete history of completing a 3-dose immunization series with Td-containing vaccines should begin or complete a primary immunization series including a Tdap dose. Adults should receive a Td booster every 10 years.  Varicella vaccine. An adult without evidence of immunity to varicella should receive 2 doses or a second dose if she has previously received 1 dose. Pregnant females who do not have evidence of immunity should receive the first dose after pregnancy. This first dose should be obtained before leaving the health care facility. The second dose should be obtained 4-8 weeks after the first dose.  Human papillomavirus (HPV) vaccine. Females aged 13-26 years who have not received the vaccine previously should obtain the 3-dose series. The vaccine is not recommended for use in pregnant females. However, pregnancy testing is not needed before receiving a dose. If a female is found to be pregnant after receiving a dose, no treatment is needed. In that case, the remaining doses should be delayed until after the pregnancy. Immunization is recommended for any person with an immunocompromised condition through the age of 95 years if she  did not get any or all doses earlier. During the 3-dose series, the second  dose should be obtained 4-8 weeks after the first dose. The third dose should be obtained 24 weeks after the first dose and 16 weeks after the second dose.  Zoster vaccine. One dose is recommended for adults aged 86 years or older unless certain conditions are present.  Measles, mumps, and rubella (MMR) vaccine. Adults born before [birth year] generally are considered immune to measles and mumps. Adults born in [birth year] or later should have 1 or more doses of MMR vaccine unless there is a contraindication to the vaccine or there is laboratory evidence of immunity to each of the three diseases. A routine second dose of MMR vaccine should be obtained at least 28 days after the first dose for students attending postsecondary schools, health care workers, or international travelers. People who received inactivated measles vaccine or an unknown type of measles vaccine during 1963-1967 should receive 2 doses of MMR vaccine. People who received inactivated mumps vaccine or an unknown type of mumps vaccine before 1979 and are at high risk for mumps infection should consider immunization with 2 doses of MMR vaccine. For females of childbearing age, rubella immunity should be determined. If there is no evidence of immunity, females who are not pregnant should be vaccinated. If there is no evidence of immunity, females who are pregnant should delay immunization until after pregnancy. Unvaccinated health care workers born before [birth year] who lack laboratory evidence of measles, mumps, or rubella immunity or laboratory confirmation of disease should consider measles and mumps immunization with 2 doses of MMR vaccine or rubella immunization with 1 dose of MMR vaccine.  Pneumococcal 13-valent conjugate (PCV13) vaccine. When indicated, a person who is uncertain of her immunization history and has no record of immunization should receive the PCV13 vaccine. An adult  aged 74 years or older who has certain medical conditions and has not been previously immunized should receive 1 dose of PCV13 vaccine. This PCV13 should be followed with a dose of pneumococcal polysaccharide (PPSV23) vaccine. The PPSV23 vaccine dose should be obtained at least 8 weeks after the dose of PCV13 vaccine. An adult aged 53 years or older who has certain medical conditions and previously received 1 or more doses of PPSV23 vaccine should receive 1 dose of PCV13. The PCV13 vaccine dose should be obtained 1 or more years after the last PPSV23 vaccine dose.  Pneumococcal polysaccharide (PPSV23) vaccine. When PCV13 is also indicated, PCV13 should be obtained first. All adults aged 25 years and older should be immunized. An adult younger than age 79 years who has certain medical conditions should be immunized. Any person who resides in a nursing home or long-term care facility should be immunized. An adult smoker should be immunized. People with an immunocompromised condition and certain other conditions should receive both PCV13 and PPSV23 vaccines. People with human immunodeficiency virus (HIV) infection should be immunized as soon as possible after diagnosis. Immunization during chemotherapy or radiation therapy should be avoided. Routine use of PPSV23 vaccine is not recommended for American Indians, [place] Natives, or people younger than 65 years unless there are medical conditions that require PPSV23 vaccine. When indicated, people who have unknown immunization and have no record of immunization should receive PPSV23 vaccine. One-time revaccination 5 years after the first dose of PPSV23 is recommended for people aged 19-64 years who have chronic kidney failure, nephrotic syndrome, asplenia, or immunocompromised conditions. People who received 1-2 doses of PPSV23 before age 61 years should receive another dose of PPSV23 vaccine at age 31 years or later if  at least 5 years have passed since the previous  dose. Doses of PPSV23 are not needed for people immunized with PPSV23 at or after age 46 years.  Meningococcal vaccine. Adults with asplenia or persistent complement component deficiencies should receive 2 doses of quadrivalent meningococcal conjugate (MenACWY-D) vaccine. The doses should be obtained at least 2 months apart. Microbiologists working with certain meningococcal bacteria, [place] recruits, people at risk during an outbreak, and people who travel to or live in countries with a high rate of meningitis should be immunized. A first-year college student up through age 76 years who is living in a residence hall should receive a dose if she did not receive a dose on or after her 16th birthday. Adults who have certain high-risk conditions should receive one or more doses of vaccine.  Hepatitis A vaccine. Adults who wish to be protected from this disease, have certain high-risk conditions, work with hepatitis A-infected animals, work in hepatitis A research labs, or travel to or work in countries with a high rate of hepatitis A should be immunized. Adults who were previously unvaccinated and who anticipate close contact with an international adoptee during the first 60 days after arrival in the [place] from a country with a high rate of hepatitis A should be immunized.  Hepatitis B vaccine. Adults who wish to be protected from this disease, have certain high-risk conditions, may be exposed to blood or other infectious body fluids, are household contacts or sex partners of hepatitis B positive people, are clients or workers in certain care facilities, or travel to or work in countries with a high rate of hepatitis B should be immunized.  Haemophilus influenzae type b (Hib) vaccine. A previously unvaccinated person with asplenia or sickle cell disease or having a scheduled splenectomy should receive 1 dose of Hib vaccine. Regardless of previous immunization, a recipient of a hematopoietic stem cell  transplant should receive a 3-dose series 6-12 months after her successful transplant. Hib vaccine is not recommended for adults with HIV infection. Preventive Services / Frequency Ages 67 to 70 years  Blood pressure check.** / Every 1 to 2 years.  Lipid and cholesterol check.** / Every 5 years beginning at age 80.  Clinical breast exam.** / Every 3 years for women in their 66s and 71s.  BRCA-related cancer risk assessment.** / For women who have family members with a BRCA-related cancer (breast, ovarian, tubal, or peritoneal cancers).  Pap test.** / Every 2 years from ages 77 through 25. Every 3 years starting at age 58 through age 46 or 74 with a history of 3 consecutive normal Pap tests.  HPV screening.** / Every 3 years from ages 67 through ages 9 to 54 with a history of 3 consecutive normal Pap tests.  Hepatitis C blood test.** / For any individual with known risks for hepatitis C.  Skin self-exam. / Monthly.  Influenza vaccine. / Every year.  Tetanus, diphtheria, and acellular pertussis (Tdap, Td) vaccine.** / Consult your health care provider. Pregnant women should receive 1 dose of Tdap vaccine during each pregnancy. 1 dose of Td every 10 years.  Varicella vaccine.** / Consult your health care provider. Pregnant females who do not have evidence of immunity should receive the first dose after pregnancy.  HPV vaccine. / 3 doses over 6 months, if 57 and younger. The vaccine is not recommended for use in pregnant females. However, pregnancy testing is not needed before receiving a dose.  Measles, mumps, rubella (MMR) vaccine.** / You need  at least 1 dose of MMR if you were born in 1957 or later. You may also need a 2nd dose. For females of childbearing age, rubella immunity should be determined. If there is no evidence of immunity, females who are not pregnant should be vaccinated. If there is no evidence of immunity, females who are pregnant should delay immunization until after  pregnancy.  Pneumococcal 13-valent conjugate (PCV13) vaccine.** / Consult your health care provider.  Pneumococcal polysaccharide (PPSV23) vaccine.** / 1 to 2 doses if you smoke cigarettes or if you have certain conditions.  Meningococcal vaccine.** / 1 dose if you are age 27 to 78 years and a Market researcher living in a residence hall, or have one of several medical conditions, you need to get vaccinated against meningococcal disease. You may also need additional booster doses.  Hepatitis A vaccine.** / Consult your health care provider.  Hepatitis B vaccine.** / Consult your health care provider.  Haemophilus influenzae type b (Hib) vaccine.** / Consult your health care provider. Ages 86 to 75 years  Blood pressure check.** / Every 1 to 2 years.  Lipid and cholesterol check.** / Every 5 years beginning at age 69 years.  Lung cancer screening. / Every year if you are aged 28-80 years and have a 30-pack-year history of smoking and currently smoke or have quit within the past 15 years. Yearly screening is stopped once you have quit smoking for at least 15 years or develop a health problem that would prevent you from having lung cancer treatment.  Clinical breast exam.** / Every year after age 45 years.  BRCA-related cancer risk assessment.** / For women who have family members with a BRCA-related cancer (breast, ovarian, tubal, or peritoneal cancers).  Mammogram.** / Every year beginning at age 71 years and continuing for as long as you are in good health. Consult with your health care provider.  Pap test.** / Every 3 years starting at age 67 years through age 4 or 11 years with a history of 3 consecutive normal Pap tests.  HPV screening.** / Every 3 years from ages 8 years through ages 8 to 47 years with a history of 3 consecutive normal Pap tests.  Fecal occult blood test (FOBT) of stool. / Every year beginning at age 26 years and continuing until age 72 years. You may  not need to do this test if you get a colonoscopy every 10 years.  Flexible sigmoidoscopy or colonoscopy.** / Every 5 years for a flexible sigmoidoscopy or every 10 years for a colonoscopy beginning at age 38 years and continuing until age 12 years.  Hepatitis C blood test.** / For all people born from [birth year] through 1967 and any individual with known risks for hepatitis C.  Skin self-exam. / Monthly.  Influenza vaccine. / Every year.  Tetanus, diphtheria, and acellular pertussis (Tdap/Td) vaccine.** / Consult your health care provider. Pregnant women should receive 1 dose of Tdap vaccine during each pregnancy. 1 dose of Td every 10 years.  Varicella vaccine.** / Consult your health care provider. Pregnant females who do not have evidence of immunity should receive the first dose after pregnancy.  Zoster vaccine.** / 1 dose for adults aged 80 years or older.  Measles, mumps, rubella (MMR) vaccine.** / You need at least 1 dose of MMR if you were born in 1957 or later. You may also need a 2nd dose. For females of childbearing age, rubella immunity should be determined. If there is no evidence of immunity, females who  are not pregnant should be vaccinated. If there is no evidence of immunity, females who are pregnant should delay immunization until after pregnancy.  Pneumococcal 13-valent conjugate (PCV13) vaccine.** / Consult your health care provider.  Pneumococcal polysaccharide (PPSV23) vaccine.** / 1 to 2 doses if you smoke cigarettes or if you have certain conditions.  Meningococcal vaccine.** / Consult your health care provider.  Hepatitis A vaccine.** / Consult your health care provider.  Hepatitis B vaccine.** / Consult your health care provider.  Haemophilus influenzae type b (Hib) vaccine.** / Consult your health care provider. Ages 11 years and over  Blood pressure check.** / Every 1 to 2 years.  Lipid and cholesterol check.** / Every 5 years beginning at age 36 years.  Lung  cancer screening. / Every year if you are aged 73-80 years and have a 30-pack-year history of smoking and currently smoke or have quit within the past 15 years. Yearly screening is stopped once you have quit smoking for at least 15 years or develop a health problem that would prevent you from having lung cancer treatment.  Clinical breast exam.** / Every year after age 29 years.  BRCA-related cancer risk assessment.** / For women who have family members with a BRCA-related cancer (breast, ovarian, tubal, or peritoneal cancers).  Mammogram.** / Every year beginning at age 18 years and continuing for as long as you are in good health. Consult with your health care provider.  Pap test.** / Every 3 years starting at age 7 years through age 32 or 58 years with 3 consecutive normal Pap tests. Testing can be stopped between 65 and 70 years with 3 consecutive normal Pap tests and no abnormal Pap or HPV tests in the past 10 years.  HPV screening.** / Every 3 years from ages 33 years through ages 61 or 77 years with a history of 3 consecutive normal Pap tests. Testing can be stopped between 65 and 70 years with 3 consecutive normal Pap tests and no abnormal Pap or HPV tests in the past 10 years.  Fecal occult blood test (FOBT) of stool. / Every year beginning at age 27 years and continuing until age 34 years. You may not need to do this test if you get a colonoscopy every 10 years.  Flexible sigmoidoscopy or colonoscopy.** / Every 5 years for a flexible sigmoidoscopy or every 10 years for a colonoscopy beginning at age 60 years and continuing until age 59 years.  Hepatitis C blood test.** / For all people born from [birth year] through 1965 and any individual with known risks for hepatitis C.  Osteoporosis screening.** / A one-time screening for women ages 53 years and over and women at risk for fractures or osteoporosis.  Skin self-exam. / Monthly.  Influenza vaccine. / Every year.  Tetanus, diphtheria, and  acellular pertussis (Tdap/Td) vaccine.** / 1 dose of Td every 10 years.  Varicella vaccine.** / Consult your health care provider.  Zoster vaccine.** / 1 dose for adults aged 38 years or older.  Pneumococcal 13-valent conjugate (PCV13) vaccine.** / Consult your health care provider.  Pneumococcal polysaccharide (PPSV23) vaccine.** / 1 dose for all adults aged 8 years and older.  Meningococcal vaccine.** / Consult your health care provider.  Hepatitis A vaccine.** / Consult your health care provider.  Hepatitis B vaccine.** / Consult your health care provider.  Haemophilus influenzae type b (Hib) vaccine.** / Consult your health care provider. ** Family history and personal history of risk and conditions may change your health care provider's  recommendations. Document Released: 06/29/2001 Document Revised: 09/17/2013 Document Reviewed: 09/28/2010 [facility] Patient Information 2015 [place], [place]. This information is not intended to replace advice given to you by your health care provider. Make sure you discuss any questions you have with your health care provider.

## 2014-02-11 NOTE — Progress Notes (Signed)
Pre visit review using our clinic review tool, if applicable. No additional management support is needed unless otherwise documented below in the visit note. 

## 2014-02-11 NOTE — Telephone Encounter (Signed)
Pt calling requesting samples of Anoro to last her until next month.  Pt aware that she has refills at pharmacy--states she is not able to refill at this time d/t cost. Pt is in donut hole. States that she is going to renew/change her insurance next month(October) and only needs one month supply to last until she is able to speak with insurance and make changes.  Sample placed up front - documented Informed patient about patient assistance program for [place]-- pt given forms with samples.  Nothing further needed.

## 2014-02-11 NOTE — Progress Notes (Signed)
Subjective:    Patient ID: Victoria [name], female    DOB: Mar 13, 1946, 68 y.o.   MRN: [id number]  Hypertension This is a chronic problem. The current episode started more than 1 year ago. The problem is unchanged. The problem is controlled. Associated symptoms include shortness of breath (worsening DOE). Pertinent negatives include no anxiety, blurred vision, chest pain, headaches, malaise/fatigue, neck pain, orthopnea, palpitations, peripheral edema, PND or sweats. There are no associated agents to hypertension. Past treatments include ACE inhibitors, diuretics and calcium channel blockers. The current treatment provides moderate improvement. Compliance problems include diet and exercise.       Review of Systems  Constitutional: Negative.  Negative for fever, chills, malaise/fatigue, diaphoresis, activity change, appetite change, fatigue and unexpected weight change.  HENT: Negative.   Eyes: Negative.  Negative for blurred vision.  Respiratory: Positive for shortness of breath (worsening DOE). Negative for apnea, cough, choking, chest tightness, wheezing and stridor.   Cardiovascular: Negative.  Negative for chest pain, palpitations, orthopnea, leg swelling and PND.  Gastrointestinal: Negative.  Negative for nausea, vomiting, abdominal pain, diarrhea, constipation and blood in stool.  Endocrine: Negative.   Genitourinary: Negative.  Negative for difficulty urinating.  Musculoskeletal: Negative.  Negative for arthralgias, back pain, gait problem, joint swelling and neck pain.  Skin: Negative.  Negative for rash.  Allergic/Immunologic: Negative.   Neurological: Negative.  Negative for dizziness, tremors, seizures, syncope, facial asymmetry, speech difficulty, weakness, light-headedness and headaches.  Hematological: Negative.  Negative for adenopathy. Does not bruise/bleed easily.  Psychiatric/Behavioral: Negative.        Objective:   Physical Exam  Vitals reviewed. Constitutional: She  is oriented to person, place, and time. She appears well-developed and well-nourished. No distress.  HENT:  Head: Normocephalic and atraumatic.  Mouth/Throat: Oropharynx is clear and moist. No oropharyngeal exudate.  Eyes: Conjunctivae are normal. Right eye exhibits no discharge. Left eye exhibits no discharge. No scleral icterus.  Neck: Normal range of motion. Neck supple. No JVD present. No tracheal deviation present. No thyromegaly present.  Cardiovascular: Normal rate, regular rhythm, S1 normal, S2 normal and intact distal pulses.  Exam reveals no gallop, no S3, no S4 and no friction rub.   Murmur heard. Pulses:      Carotid pulses are 1+ on the right side, and 1+ on the left side.      Radial pulses are 1+ on the right side, and 1+ on the left side.       Femoral pulses are 1+ on the right side, and 1+ on the left side.      Popliteal pulses are 1+ on the right side, and 1+ on the left side.       Dorsalis pedis pulses are 1+ on the right side, and 1+ on the left side.       Posterior tibial pulses are 1+ on the right side, and 1+ on the left side.  1/6 SEM over LLSB without radiation  Pulmonary/Chest: Effort normal and breath sounds normal. No stridor. No respiratory distress. She has no wheezes. She has no rales. She exhibits no tenderness.  Abdominal: Soft. Bowel sounds are normal. She exhibits no distension and no mass. There is no tenderness. There is no rebound and no guarding.  Musculoskeletal: Normal range of motion. She exhibits no edema and no tenderness.  Lymphadenopathy:    She has no cervical adenopathy.  Neurological: She is oriented to person, place, and time.  Skin: Skin is warm and dry. No rash noted.  She is not diaphoretic. No erythema. No pallor.  Psychiatric: She has a normal mood and affect. Her behavior is normal. Judgment and thought content normal.     Lab Results  Component Value Date   WBC 6.7 04/18/2011   HGB 14.6 04/18/2011   HCT 43.9 04/18/2011   PLT 197  04/18/2011   GLUCOSE 97 02/01/2013   CHOL 183 07/06/2012   TRIG 92.0 07/06/2012   HDL 57.50 07/06/2012   LDLCALC 107* 07/06/2012   ALT 6 02/01/2013   AST 11 02/01/2013   NA 137 02/01/2013   K 4.0 02/01/2013   CL 100 02/01/2013   CREATININE 0.9 02/01/2013   BUN 16 02/01/2013   CO2 32 02/01/2013   INR 1.0 11/28/2006       Assessment & Plan:

## 2014-02-12 ENCOUNTER — Encounter: Payer: Self-pay | Admitting: Internal Medicine

## 2014-02-12 DIAGNOSIS — R0602 Shortness of breath: Secondary | ICD-10-CM | POA: Insufficient documentation

## 2014-02-12 DIAGNOSIS — R9431 Abnormal electrocardiogram [ECG] [EKG]: Secondary | ICD-10-CM | POA: Insufficient documentation

## 2014-02-12 LAB — LDL CHOLESTEROL, DIRECT: LDL DIRECT: 103.7 mg/dL

## 2014-02-12 LAB — TSH: TSH: 0.69 u[IU]/mL (ref 0.35–4.50)

## 2014-02-12 NOTE — Assessment & Plan Note (Signed)
She has worsening DOE, this may be caused by COPD Will check a Lexiscan to screen for ischemia

## 2014-02-12 NOTE — Assessment & Plan Note (Signed)
She is due for a f/up on this Will refer to ENDO for further evaluation

## 2014-02-12 NOTE — Assessment & Plan Note (Signed)
Her BP is adequately well controlled 

## 2014-02-12 NOTE — Assessment & Plan Note (Addendum)

## 2014-02-12 NOTE — Assessment & Plan Note (Signed)
She has Q waves and loss of voltage in V1-V3, there are no old EKG's for comparison She thinks she has had a small heart attack before, will get a Lexiscan done

## 2014-02-12 NOTE — Assessment & Plan Note (Signed)
She has achieved her LDL goal 

## 2014-02-14 ENCOUNTER — Telehealth: Payer: Self-pay | Admitting: *Deleted

## 2014-02-14 DIAGNOSIS — M810 Age-related osteoporosis without current pathological fracture: Secondary | ICD-10-CM

## 2014-02-14 NOTE — Telephone Encounter (Signed)
Pt stated she received a call from another office stating Dr. Ronnald Ramp wanted her to see endocrinologist concerning her osteoporosis. Pt state md did not tell her he was referring her to see someone. Why would she see endocrinologist? Inform pt md is out of the office this week will give her a call back on Monday when he return with his response. Also she wanted to ask md about having a bone density test done. She hasn't had one done since 08/2009...Victoria [name]

## 2014-02-17 NOTE — Telephone Encounter (Signed)
BMD test ordered ENDO's treat osteoporosis

## 2014-02-18 NOTE — Telephone Encounter (Signed)
Called pt no answer LMOM with md response.../lmb 

## 2014-02-22 ENCOUNTER — Encounter: Payer: Self-pay | Admitting: Internal Medicine

## 2014-02-22 ENCOUNTER — Encounter (HOSPITAL_COMMUNITY): Payer: Self-pay | Admitting: *Deleted

## 2014-02-27 ENCOUNTER — Encounter: Payer: Self-pay | Admitting: Internal Medicine

## 2014-02-27 ENCOUNTER — Telehealth (HOSPITAL_COMMUNITY): Payer: Self-pay

## 2014-02-27 ENCOUNTER — Ambulatory Visit (INDEPENDENT_AMBULATORY_CARE_PROVIDER_SITE_OTHER)
Admission: RE | Admit: 2014-02-27 | Discharge: 2014-02-27 | Disposition: A | Discharge status: Home / Self Care | Payer: Commercial Managed Care - HMO | Source: Ambulatory Visit | Attending: Internal Medicine | Admitting: Internal Medicine

## 2014-02-27 ENCOUNTER — Ambulatory Visit (INDEPENDENT_AMBULATORY_CARE_PROVIDER_SITE_OTHER): Payer: Commercial Managed Care - HMO | Admitting: Internal Medicine

## 2014-02-27 VITALS — BP 138/68 | HR 75 | Ht 65.0 in | Wt 220.0 lb

## 2014-02-27 DIAGNOSIS — J449 Chronic obstructive pulmonary disease, unspecified: Secondary | ICD-10-CM

## 2014-02-27 DIAGNOSIS — I10 Essential (primary) hypertension: Secondary | ICD-10-CM

## 2014-02-27 DIAGNOSIS — R0600 Dyspnea, unspecified: Secondary | ICD-10-CM

## 2014-02-27 DIAGNOSIS — M810 Age-related osteoporosis without current pathological fracture: Secondary | ICD-10-CM

## 2014-02-27 DIAGNOSIS — Z23 Encounter for immunization: Secondary | ICD-10-CM

## 2014-02-27 DIAGNOSIS — R911 Solitary pulmonary nodule: Secondary | ICD-10-CM

## 2014-02-27 NOTE — Telephone Encounter (Signed)
Encounter complete. 

## 2014-02-27 NOTE — Patient Instructions (Addendum)
#  COPD is currently stable disease Follow med calendar closely and bring to each visit;  Restart Flovent 2 puff twice daily Continue on ANORO 1 puff daily  FLu shot 02/27/2014 Retest ONO on Room Air without nail polish to see if you still need nocturnal o2  #SHortness of breath  - this is out of proportion to copd  - Agree with stress test by Scarlette Calico, MD to rule out angina  - I wil message Dr Ronnald Ramp to see if he can get you off lisinopril - Get CT chest wo contrast for dyspnea - - IF above measuers do not help, then dyspnea is due to obesity, diast dysfn, copd and deconditioning  #BP  - will message Dr Scarlette Calico, MD to change BP meds to see if this can help wheeze  #FOllowup  4 months with NP Tammy PArrett

## 2014-02-27 NOTE — Progress Notes (Signed)
Subjective:    Patient ID: Victoria [name], female    DOB: 1945-07-25, 68 y.o.   MRN: [id number]  HPI    #COPD/Asthma  - 50 pack smoker, quit Jan 2012 -   CXR 04/18/11  -copd, lingular atelectasis - PFT 06/19/11 (done on advair):  - fev1 0.97L/43%, Ratio 43, TLC 5L/104%, DLCO 12.3/51%, No BD response - CAT score 7 - May 2012 - Rx spiriva and advair - Alpha 1 11/23/2011:    #Obesity  Body mass index is 34.61 kg/(m^2). 08/05/11 Body mass index is 33.76 kg/(m^2). - 09/15/2011 Body mass index is 35.46 kg/(m^2). - 11/23/2011 Body mass index is 35.38 kg/(m^2). on 04/23/2013 Body mass index is 36.61 kg/(m^2). on 02/27/2014   #Lung cancer screening  - not done as of 08/27/2013    OV 02/27/2014  Chief Complaint  Patient presents with  . Follow-up    Pt states she has had worsening SOB since last OV. Pt states she is not able to do as much activity without becoming SOB. Pt states she has mild non prod cough. Pt denies CP/tightness.      FU Gold stge 3 copd: Overall stable. SInce sept 2015 mild increased dyspnea n exertion; class 2-3. No associated chest tightness, cough, chest pain. No orthopnea, pnd. Some increased wheezing with exertion. Ran out of flovent few weeks ago but time sequence not correlating with increaed dyspnea. PCP has arranged for stress test cardiac. She cannot attend rehab due to schedule conflicts. She is on ace inhibitor but on several other bP meds too. SHe will have flu shot 02/27/2014 today      Review of Systems  Constitutional: Negative for fever and unexpected weight change.  HENT: Negative for congestion, dental problem, ear pain, nosebleeds, postnasal drip, rhinorrhea, sinus pressure, sneezing, sore throat and trouble swallowing.   Eyes: Negative for redness and itching.  Respiratory: Positive for cough and shortness of breath. Negative for chest tightness and wheezing.   Cardiovascular: Negative for palpitations and leg swelling.  Gastrointestinal:  Negative for nausea and vomiting.  Genitourinary: Negative for dysuria.  Musculoskeletal: Negative for joint swelling.  Skin: Negative for rash.  Neurological: Negative for headaches.  Hematological: Does not bruise/bleed easily.  Psychiatric/Behavioral: Negative for dysphoric mood. The patient is not nervous/anxious.        Objective:   Physical Exam  Filed Vitals:   02/27/14 0913  BP: 138/68  Pulse: 75  Height: 5\' 5"  (1.651 m)  Weight: 220 lb (99.791 kg)  SpO2: 98%    itals reviewed. Constitutional: She is oriented to person, place, and time. She appears well-developed and well-nourished. No distress.  Body mass index is 36.61 kg/(m^2).  obese  HENT:  Head: Normocephalic and atraumatic.  Right Ear: External ear normal.  Left Ear: External ear normal.  Mouth/Throat: Oropharynx is clear and moist. No oropharyngeal exudate.  Eyes: Conjunctivae and EOM are normal. Pupils are equal, round, and reactive to light. Right eye exhibits no discharge. Left eye exhibits no discharge. No scleral icterus.  Neck: Normal range of motion. Neck supple. No JVD present. No tracheal deviation present. No thyromegaly present.  Cardiovascular: Normal rate, regular rhythm, normal heart sounds and intact distal pulses.  Exam reveals no gallop and no friction rub.   No murmur heard. Pulmonary/Chest: Effort normal and breath sounds normal. No respiratory distress. She has no wheezes. She has no rales. She exhibits no tenderness.  Abdominal: Soft. Bowel sounds are normal. She exhibits no distension and no mass. There  is no tenderness. There is no rebound and no guarding.  Musculoskeletal: Normal range of motion. She exhibits no edema and no tenderness.  Lymphadenopathy:    She has no cervical adenopathy.  Neurological: She is alert and oriented to person, place, and time. She has normal reflexes. No cranial nerve deficit. She exhibits normal muscle tone. Coordination normal.  Skin: Skin is warm and  dry. No rash noted. She is not diaphoretic. No erythema. No pallor.  Psychiatric: She has a normal mood and affect. Her behavior is normal. Judgment and thought content normal.           Assessment & Plan:  #COPD is currently stable disease Follow med calendar closely and bring to each visit;  Restart Flovent 2 puff twice daily Continue on ANORO 1 puff daily  FLu shot 02/27/2014 Retest ONO on Room Air without nail polish to see if you still need nocturnal o2  #SHortness of breath  - this is out of proportion to copd and unclear why worsened  - Agree with stress test by Scarlette Calico, MD to rule out angina  - I wil message Dr Ronnald Ramp to see if he can get you off lisinopril - Get CT chest wo contrast for dyspnea - IF above measuers do not help, then dyspnea is due to obesity, diast dysfn, copd and deconditioning  #BP  - will message Dr Scarlette Calico, MD to change BP meds to see if this can help wheeze  #FOllowup  4 months with NP Tammy PArrett    > 50% of this > 25 min visit spent in face to face counseling (15 min visit converted to 25 min)    Dr. Brand Males, M.D., [facility].C.P Pulmonary and Critical Care Medicine Staff Physician [facility] Pager: [phone], If no answer or between  15:00h - 7:00h: call [phone]  02/27/2014 9:41 AM

## 2014-02-28 ENCOUNTER — Encounter: Payer: Self-pay | Admitting: Endocrinology

## 2014-02-28 ENCOUNTER — Ambulatory Visit (INDEPENDENT_AMBULATORY_CARE_PROVIDER_SITE_OTHER): Payer: Commercial Managed Care - HMO | Admitting: Endocrinology

## 2014-02-28 VITALS — BP 126/60 | HR 75 | Temp 98.2°F | Ht 65.5 in | Wt 220.0 lb

## 2014-02-28 DIAGNOSIS — M810 Age-related osteoporosis without current pathological fracture: Secondary | ICD-10-CM

## 2014-02-28 LAB — VITAMIN D 25 HYDROXY (VIT D DEFICIENCY, FRACTURES): VITD: 46.08 ng/mL (ref 30.00–100.00)

## 2014-02-28 MED ORDER — ALENDRONATE SODIUM 70 MG PO TABS: 70.0000 mg | ORAL_TABLET | ORAL | Status: DC

## 2014-02-28 NOTE — Progress Notes (Signed)
Subjective:    Patient ID: Victoria [name], female    DOB: 04-30-1946, 68 y.o.   MRN: [id number]  HPI Pt was noted to have osteopenia in approx 2007.  She has never been on rx for osteoporosis.  She had slight fx of the right 3rd toe in the 1980's.  No assoc gait difficulty.   She quit smoking in 2012.  She seldom consumes EtOH. She has a multinodular goiter, but it has been stable, and she has been euthyroid.  She has no h/o renal dz, liver dz, cancer, anticoagulation, and early menopause.   Past Medical History  Diagnosis Date  . Asthma     uses advair   . Arthritis     left ankle, right knee, right SI joint, wrists  . Varicella as child  . 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 Hypertension   . Hyperlipidemia   . Tuberculosis 2009    h/o positive PPD- seen at health department, had  normal chest x-ray. she was not treated for a  new conversion.  [name] COPD (chronic obstructive pulmonary disease)   . Murmur, cardiac 1950    Past Surgical History  Procedure Laterality Date  . Excision of pelvic absess, right ovary      2008  . Appendectomy      2008  . Cesarean section      1972    History   Social History  . Marital Status: Divorced    Spouse Name: N/A    Number of Children: 1  . Years of Education: 16   Occupational History  . counselor, [place]    Social History Main Topics  . Smoking status: Former Smoker -- 0.50 packs/day for 40 years    Types: Cigarettes    Quit date: 04/16/2011  . Smokeless tobacco: Never Used  . Alcohol Use: No     Comment: rare occasion  . Drug Use: No  . Sexual Activity: No   Other Topics Concern  . Not on file   Social History Narrative   HSG; [name], [place]. . Married '69 - 9 yrs/divorced. 1 son - '72; no grandchildren.   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 No h/o physical or sexual abuse. ACP - no living will - wants information. Provided packet of  information. On 08/05/2011: she stated she was distant cousins to celebrities [name] and [name]          Current Outpatient Prescriptions on File Prior to Visit  Medication Sig Dispense Refill  . albuterol (VENTOLIN HFA) 108 (90 BASE) MCG/ACT inhaler INHALE ONE PUFF EVERY 6 HOURS AS NEEDED FOR WHEEZING  18 each  5  . amLODipine (NORVASC) 10 MG tablet Take 1 tablet (10 mg total) by mouth daily.  30 tablet  6  . aspirin 81 MG tablet Take 81 mg by mouth daily.        . Cholecalciferol (VITAMIN D-3) 5000 UNITS TABS Take 1 tablet by mouth every morning.      . fluticasone (FLOVENT HFA) 44 MCG/ACT inhaler Inhale 2 puffs into the lungs 2 (two) times daily.  1 Inhaler  6  . lisinopril-hydrochlorothiazide (PRINZIDE,ZESTORETIC) 20-12.5 MG per tablet Take 1 tablet by mouth daily.  30 tablet  6  . lovastatin (MEVACOR) 20 MG tablet Take 1 tablet (20 mg total) by mouth daily.  30 tablet  6  . Umeclidinium-Vilanterol (ANORO ELLIPTA) 62.5-25 MCG/INH AEPB INHALE ONE PUFF  ONCE DAILY  7 each  0   No current facility-administered medications on file prior to visit.    Allergies  Allergen Reactions  . Nickel Rash    Severe rash to infection: pt is allergic to all metals other than sterling silver or gold jewelry.   [name] [Olmesartan] Swelling  . Codeine Other (See Comments)    jittery  . [name] [Valsartan]   . Monosodium Glutamate     Facial swelling per pt    Family History  Problem Relation Age of Onset  . Heart disease Mother     MI - fatal  . Hypertension Mother   . Stroke Father 71    fatal  . Alzheimer's disease Father   . Alzheimer's disease Brother   . Hyperlipidemia Brother   . Hypertension Brother   . Diabetes Brother   . Hypertension Brother   . Hyperlipidemia Brother   neg for osteoporosis.   BP 126/60  Pulse 75  Temp(Src) 98.2 F (36.8 C) (Oral)  Ht 5' 5.5" (1.664 m)  Wt 220 lb (99.791 kg)  BMI 36.04 kg/m2  SpO2 93%  Review of Systems denies falls,  cramps, memory loss, back pain, visual loss, cold intolerance, diarrhea, rash, easy bruising, heartburn, vertigo, and rhinorrhea.  She has weight gain.      Objective:   Physical Exam VS: see vs page GEN: no distress HEAD: head: no deformity eyes: no periorbital swelling, no proptosis external nose and ears are normal mouth: no lesion seen NECK: supple, thyroid is not enlarged CHEST WALL: no kyphosis MUSCULOSKELETAL: muscle bulk and strength are grossly normal.  no obvious joint swelling.  gait is normal and steady EXTEMITIES: no deformity.   no edema NEURO:  cn 2-12 grossly intact.  sensation is intact to touch on all 4's. No tremor SKIN:  Normal texture and temperature.  No rash or suspicious lesion is visible.   NODES:  None palpable at the neck PSYCH: alert, well-oriented.  Does not appear anxious nor depressed.    i reviewed dexa images  Lab Results  Component Value Date   CALCIUM 9.3 02/11/2014   i have reviewed the following old records: Office notes    Assessment & Plan:  Osteoporosis, new to me.   Patient is advised the following: Patient Instructions  it is critically important to prevent falling down (keep floor areas well-lit, dry, and free of loose objects.  If you have a cane, walker, or wheelchair, you should use it, even for short trips around the house.  Also, try not to rush). blood tests are being requested for you today.  We'll contact you with results. i have sent a prescription to your pharmacy, for osteoporosis medication. Please return in 1 year.

## 2014-02-28 NOTE — Patient Instructions (Addendum)
it is critically important to prevent falling down (keep floor areas well-lit, dry, and free of loose objects.  If you have a cane, walker, or wheelchair, you should use it, even for short trips around the house.  Also, try not to rush). blood tests are being requested for you today.  We'll contact you with results. i have sent a prescription to your pharmacy, for osteoporosis medication. Please return in 1 year.

## 2014-03-01 ENCOUNTER — Telehealth (HOSPITAL_COMMUNITY): Payer: Self-pay

## 2014-03-01 ENCOUNTER — Ambulatory Visit (HOSPITAL_COMMUNITY)
Admission: RE | Admit: 2014-03-01 | Discharge: 2014-03-01 | Disposition: A | Discharge status: Home / Self Care | Payer: Medicare HMO | Source: Ambulatory Visit | Attending: Cardiology | Admitting: Cardiology

## 2014-03-01 DIAGNOSIS — E669 Obesity, unspecified: Secondary | ICD-10-CM | POA: Diagnosis not present

## 2014-03-01 DIAGNOSIS — E785 Hyperlipidemia, unspecified: Secondary | ICD-10-CM | POA: Insufficient documentation

## 2014-03-01 DIAGNOSIS — R0602 Shortness of breath: Secondary | ICD-10-CM

## 2014-03-01 DIAGNOSIS — Z87891 Personal history of nicotine dependence: Secondary | ICD-10-CM | POA: Diagnosis not present

## 2014-03-01 DIAGNOSIS — I10 Essential (primary) hypertension: Secondary | ICD-10-CM | POA: Diagnosis not present

## 2014-03-01 DIAGNOSIS — R9431 Abnormal electrocardiogram [ECG] [EKG]: Secondary | ICD-10-CM

## 2014-03-01 DIAGNOSIS — R0609 Other forms of dyspnea: Secondary | ICD-10-CM | POA: Diagnosis present

## 2014-03-01 LAB — PTH, INTACT AND CALCIUM
CALCIUM: 9.7 mg/dL (ref 8.4–10.5)
PTH: 64 pg/mL (ref 14–64)

## 2014-03-01 MED ORDER — AMINOPHYLLINE 25 MG/ML IV SOLN
75.0000 mg | Freq: Once | INTRAVENOUS | Status: AC
  Administered 2014-03-01: 75 mg via INTRAVENOUS

## 2014-03-01 MED ORDER — TECHNETIUM TC 99M SESTAMIBI GENERIC - CARDIOLITE
10.4000 | Freq: Once | INTRAVENOUS | Status: AC | PRN
  Administered 2014-03-01: 10 via INTRAVENOUS

## 2014-03-01 MED ORDER — REGADENOSON 0.4 MG/5ML IV SOLN
0.4000 mg | Freq: Once | INTRAVENOUS | Status: AC
  Administered 2014-03-01: 0.4 mg via INTRAVENOUS

## 2014-03-01 MED ORDER — TECHNETIUM TC 99M SESTAMIBI GENERIC - CARDIOLITE
29.8000 | Freq: Once | INTRAVENOUS | Status: AC | PRN
  Administered 2014-03-01: 29.8 via INTRAVENOUS

## 2014-03-01 NOTE — Procedures (Addendum)
[place] [address] [place] [address] [phone]  Cardiology Nuclear Med Study  Victoria [name] is a 68 y.o. female     MRN : [id number]     DOB: 12-Oct-1945  Procedure Date: 03/01/2014  Nuclear Med Background Indication for Stress Test:  Evaluation for Ischemia and Abnormal EKG History:  Asthma, COPD and MI-2010;No prior NUC MPI for comparison. Cardiac Risk Factors: History of Smoking, Hypertension, Lipids and Obesity  Symptoms:  DOE and SOB   Nuclear Pre-Procedure Caffeine/Decaff Intake:  7:00pm NPO After: 5:00am   IV Site: R Forearm  IV 0.9% NS with Angio Cath:  22g  Chest Size (in):  n/a IV Started by: Rolene Course, RN  Height: 5\' 6"  (1.676 m)  Cup Size: DD  BMI:  Body mass index is 35.36 kg/(m^2). Weight:  219 lb (99.338 kg)   Tech Comments:  n/a    Nuclear Med Study 1 or 2 day study: 1 day  Stress Test Type:  [place]  Order Authorizing Provider:  Scarlette Calico, MD   Resting Radionuclide: Technetium 46m Sestamibi  Resting Radionuclide Dose: 10.4 mCi   Stress Radionuclide:  Technetium 49m Sestamibi  Stress Radionuclide Dose: 29.8 mCi           Stress Protocol Rest HR: 70 Stress HR: 77  Rest BP: 154/85 Stress BP: 160/72  Exercise Time (min): n/a METS: n/a   Predicted Max HR: 150 bpm % Max HR: 64.67 bpm Rate Pressure Product: 15520  Dose of Adenosine (mg):  n/a Dose of Lexiscan: 0.4 mg  Dose of Atropine (mg): n/a Dose of Dobutamine: n/a mcg/kg/min (at max HR)  Stress Test Technologist: Leane Para, CCT Nuclear Technologist: Imagene Riches, CNMT   Rest Procedure:  Myocardial perfusion imaging was performed at rest 45 minutes following the intravenous administration of Technetium 23m Sestamibi. Stress Procedure:  The patient received IV Lexiscan 0.4 mg over 15-seconds.  Technetium 7m Sestamibi injected at 30-seconds. Patient experienced SOB and Nausea; 75 mg Aminophylline IV was administered.  There were no significant changes with Lexiscan.  Quantitative spect images were obtained after a 45 minute delay.  Transient Ischemic Dilatation (Normal <1.22):  0.99  QGS EDV:  75 ml QGS ESV:  22 ml LV Ejection Fraction: 70%     Rest ECG: NSR - Normal EKG  Stress ECG: No significant change from baseline ECG; rare isolated PVC  QPS Raw Data Images:  Normal; no motion artifact; normal heart/lung ratio. Stress Images:  Normal homogeneous uptake in all areas of the myocardium. Rest Images:  Mild distal inferior attenuation with otherwise normal homogeneous uptake in all other areas of the myocardium. Subtraction (SDS):  Normal  Impression Exercise Capacity:  Lexiscan with no exercise. BP Response:  Normal blood pressure response. Clinical Symptoms:  No significant symptoms noted. ECG Impression:  No significant ST segment change suggestive of ischemia. Comparison with Prior Nuclear Study: No previous nuclear study performed  Overall Impression:  Normal stress nuclear study.  LV Wall Motion:  NL LV Function, EF 70%; NL Wall Motion   [name] A, MD  03/01/2014 1:34 PM

## 2014-03-04 ENCOUNTER — Telehealth: Payer: Self-pay

## 2014-03-04 ENCOUNTER — Other Ambulatory Visit: Payer: Self-pay | Admitting: Internal Medicine

## 2014-03-04 DIAGNOSIS — I10 Essential (primary) hypertension: Secondary | ICD-10-CM

## 2014-03-04 DIAGNOSIS — M810 Age-related osteoporosis without current pathological fracture: Secondary | ICD-10-CM

## 2014-03-04 LAB — PROTEIN ELECTROPHORESIS, SERUM
ALBUMIN ELP: 57.4 % (ref 55.8–66.1)
Alpha-1-Globulin: 4.9 % (ref 2.9–4.9)
Alpha-2-Globulin: 8.5 % (ref 7.1–11.8)
BETA GLOBULIN: 7.5 % — AB (ref 4.7–7.2)
Beta 2: 5.3 % (ref 3.2–6.5)
Gamma Globulin: 16.4 % (ref 11.1–18.8)
M-Spike, %: 0.31 g/dL
Total Protein, Serum Electrophoresis: 6.7 g/dL (ref 6.0–8.3)

## 2014-03-04 LAB — HM DEXA SCAN: HM Dexa Scan: -2.9

## 2014-03-04 MED ORDER — HYDROCHLOROTHIAZIDE 25 MG PO TABS: 25.0000 mg | ORAL_TABLET | Freq: Every day | ORAL | Status: DC

## 2014-03-04 NOTE — Telephone Encounter (Signed)
changed

## 2014-03-04 NOTE — Telephone Encounter (Signed)
Patient called stating that she was seen last week by pulmonary who advised her that they would contact PCP to discuss an alternative for lisinopril due to causing cough for pt. Please advise

## 2014-03-05 ENCOUNTER — Ambulatory Visit (INDEPENDENT_AMBULATORY_CARE_PROVIDER_SITE_OTHER)
Admission: RE | Admit: 2014-03-05 | Discharge: 2014-03-05 | Disposition: A | Discharge status: Home / Self Care | Payer: Commercial Managed Care - HMO | Source: Ambulatory Visit | Attending: Internal Medicine | Admitting: Internal Medicine

## 2014-03-05 DIAGNOSIS — R911 Solitary pulmonary nodule: Secondary | ICD-10-CM

## 2014-03-06 ENCOUNTER — Telehealth: Payer: Self-pay | Admitting: Internal Medicine

## 2014-03-06 NOTE — Telephone Encounter (Signed)
Encounter complete. 

## 2014-03-06 NOTE — Telephone Encounter (Signed)
Pt had CT done 03/05/14 (in epic). Pt requesting results. Please advise MR thanks

## 2014-03-07 NOTE — Telephone Encounter (Signed)
Spoke with pt, she is aware of results and recs.  States that she had her stress test on Tuesday morning and is requesting the results of that.  MR please advise.  Thank you.

## 2014-03-07 NOTE — Telephone Encounter (Signed)
CT shows  1. Emphysema 2. No cancer - there are nodules but small and no change snice 2008 - so they are not cancer 3. Co artery calcification - she should keep up with stress test appt. IF stress test is normal, then the calcium deposits are sign of agiing    Ct Chest Wo Contrast  03/05/2014   CLINICAL DATA:  Worsening shortness of breath for 1 month. History of COPD. Evaluate lung nodule.  EXAM: CT CHEST WITHOUT CONTRAST  TECHNIQUE: Multidetector CT imaging of the chest was performed following the standard protocol without IV contrast.  COMPARISON:  Chest x-ray 04/23/2013  FINDINGS: Moderate centrilobular emphysema. Linear areas of scarring in the lung bases in both lower lobes, lingula and right middle lobe. No pleural effusions.  Small nodule in the right lower lobe on image 38 measures 6 mm. This is stable when compared to CT abdomen from 2008 compatible with a benign nodule. Tiny nodule on image 39 in the right lower lobe is also stable. No new or suspicious pulmonary nodules.  Heart is normal size. Aorta is normal caliber. Coronary artery and aortic calcifications without aneurysm. No mediastinal, hilar, or axillary adenopathy. Chest wall soft tissues are unremarkable.  Stable bilateral adrenal nodules most compatible with adenomas. No acute findings in the upper abdomen.  No acute bony abnormality or focal bone lesion.  IMPRESSION: Moderate emphysema.  Coronary artery disease.  Stable small nodules in the right lower lobe since 2008 compatible with benign nodules. Scarring in the lung bases. No suspicious nodules.  Stable bilateral adrenal nodules, most compatible with adenomas.   Electronically Signed   By: Rolm Baptise M.D.   On: 03/05/2014 10:00

## 2014-03-07 NOTE — Telephone Encounter (Signed)
Lmomtcb x 1

## 2014-03-07 NOTE — Telephone Encounter (Signed)
Pt returning call.Victoria [name] ° °

## 2014-03-08 ENCOUNTER — Telehealth: Payer: Self-pay | Admitting: Internal Medicine

## 2014-03-08 ENCOUNTER — Other Ambulatory Visit: Payer: Self-pay | Admitting: Endocrinology

## 2014-03-08 DIAGNOSIS — E8809 Other disorders of plasma-protein metabolism, not elsewhere classified: Secondary | ICD-10-CM

## 2014-03-08 NOTE — Telephone Encounter (Signed)
Spoke with pt-- aware of rec's per MR Pt aware to contact PCP Scarlette Calico, MD for results Nothing further needed.

## 2014-03-08 NOTE — Telephone Encounter (Signed)
Please advise on stress test results.

## 2014-03-08 NOTE — Telephone Encounter (Signed)
I did not order so she needs to talk to ordering MD who did that but it looks noirmal to me but whoever ordered has to put it in context for her

## 2014-03-08 NOTE — Telephone Encounter (Signed)
She has already been informed that the heart test was normal

## 2014-03-08 NOTE — Telephone Encounter (Signed)
See phone note from Brand Males MD office.  They said that Pt PCP office needs to give result to pt.  Pt is asking for a return called

## 2014-03-11 DIAGNOSIS — R0600 Dyspnea, unspecified: Secondary | ICD-10-CM | POA: Insufficient documentation

## 2014-03-11 DIAGNOSIS — I10 Essential (primary) hypertension: Secondary | ICD-10-CM | POA: Insufficient documentation

## 2014-03-11 DIAGNOSIS — Z23 Encounter for immunization: Secondary | ICD-10-CM | POA: Insufficient documentation

## 2014-03-11 DIAGNOSIS — J439 Emphysema, unspecified: Secondary | ICD-10-CM | POA: Insufficient documentation

## 2014-03-25 ENCOUNTER — Encounter: Payer: Self-pay | Admitting: Endocrinology

## 2014-03-26 NOTE — Telephone Encounter (Signed)
Patient stated that she would like to know in more derail about her stress Test

## 2014-03-28 ENCOUNTER — Telehealth: Payer: Self-pay | Admitting: Internal Medicine

## 2014-03-28 MED ORDER — LOVASTATIN 20 MG PO TABS: 20.0000 mg | ORAL_TABLET | Freq: Every day | ORAL | Status: DC

## 2014-03-28 MED ORDER — AMLODIPINE BESYLATE 10 MG PO TABS: 10.0000 mg | ORAL_TABLET | Freq: Every day | ORAL | Status: DC

## 2014-03-28 NOTE — Telephone Encounter (Signed)
Pt request refill for amlodipine and lovastatin to be send to [facility]. Please advise.

## 2014-04-09 ENCOUNTER — Telehealth: Payer: Self-pay | Admitting: Internal Medicine

## 2014-04-09 MED ORDER — FLUTICASONE PROPIONATE HFA 44 MCG/ACT IN AERO: 2.0000 | INHALATION_SPRAY | Freq: Two times a day (BID) | RESPIRATORY_TRACT | Status: DC

## 2014-04-09 MED ORDER — UMECLIDINIUM-VILANTEROL 62.5-25 MCG/INH IN AEPB: 1.0000 | INHALATION_SPRAY | Freq: Every day | RESPIRATORY_TRACT | Status: DC

## 2014-04-09 MED ORDER — BECLOMETHASONE DIPROPIONATE 80 MCG/ACT IN AERS: 2.0000 | INHALATION_SPRAY | Freq: Two times a day (BID) | RESPIRATORY_TRACT | Status: DC

## 2014-04-09 MED ORDER — UMECLIDINIUM-VILANTEROL 62.5-25 MCG/INH IN AEPB: INHALATION_SPRAY | RESPIRATORY_TRACT | Status: DC

## 2014-04-09 NOTE — Telephone Encounter (Signed)
lmtcb for pt.  Samples of both meds have been left up front for pick up.

## 2014-04-09 NOTE — Telephone Encounter (Signed)
flovent is $80+  And the anoro is $100+.  She stated that this is too expensive for her.  She cannot afford  this.  She is in the donut hole.  Pt stated that she is changing her insurance for next year and wanted to know if MR had any other medications that he could change her to instead of the flovent and anoro.  MR please advise. thanks

## 2014-04-09 NOTE — Telephone Encounter (Signed)
Can you guive her sample please.  Will be same dunkin-donut hole story this time of the year with all mdi  If you do not have the right samples alternatives are  For flovent - aerospan, pulmicort, qvar, asmanex  For [employer] - there is a boeringher combo with tiotropium and oladeterol - ? STIOLTO   Just give through end of year   Thanks  Dr. Brand Males, M.D., [facility].C.P Pulmonary and Critical Care Medicine Staff Physician [facility] Pager: [phone], If no answer or between  15:00h - 7:00h: call [phone]  04/09/2014 5:14 PM

## 2014-04-10 NOTE — Telephone Encounter (Signed)
Spoke with pt, shared alternatives to medications for her.  Nothing further needed.

## 2014-04-10 NOTE — Telephone Encounter (Signed)
Patient picked up samples.  She is still wanting to know if there are alternatives.

## 2014-04-10 NOTE — Telephone Encounter (Signed)
Left detailed message on voicemail advising patient that samples are at front desk to pick up.

## 2014-04-10 NOTE — Telephone Encounter (Signed)
[phone] pt is calling back

## 2014-04-10 NOTE — Telephone Encounter (Signed)
lmtcb for pt.  

## 2014-04-18 ENCOUNTER — Telehealth: Payer: Self-pay | Admitting: Internal Medicine

## 2014-04-18 NOTE — Telephone Encounter (Signed)
Per 02/27/14 OV: Patient Instructions       #COPD is currently stable disease Follow med calendar closely and bring to each visit;   Restart Flovent 2 puff twice daily Continue on ANORO 1 puff daily     Called spoke with pt. She reports pt insurance is too expensive for flovent and anoro. They gave her alternative  aerospan and stiolto respimat. Pt is changing insurances next year and will not be with [employer]. She has samples of flovent 111 puffs and anoro 5 doses left. Please advise MR thanks

## 2014-04-19 NOTE — Telephone Encounter (Signed)
lmomtcb x1 

## 2014-04-19 NOTE — Telephone Encounter (Signed)
seh can return the samples of flovent and anoro. If this is not possible, she can finish them and then come and get Rx/sample for stiolto + Jeralyn Ruths

## 2014-04-22 NOTE — Telephone Encounter (Signed)
Pt called back a/b meds i let her know that there were samples her for her to pick up and she was going to come by and pick them up.Victoria [name]

## 2014-04-22 NOTE — Telephone Encounter (Signed)
Patient returning call.  [phone]

## 2014-04-22 NOTE — Telephone Encounter (Signed)
lmtcb for pt.  

## 2014-04-22 NOTE — Telephone Encounter (Signed)
Dr Chase Caller please advise on instructions for use of [place] and Aerospan. Pt is on her way to come pick up inhalers and we have no instructions for use. Thanks.  Pt is picking up inhalers today and will await to start these once she hears from our office for instructions.  Samples need to be documented in chart once instructions given.  Please respond to [place] [id number], exp. 05/2016, [place], [employer] #1 Aerospan - Lot# [id number], exp. 05/2014, [place], [place] #1

## 2014-04-23 MED ORDER — TIOTROPIUM BROMIDE-OLODATEROL 2.5-2.5 MCG/ACT IN AERS: 2.0000 | INHALATION_SPRAY | Freq: Every morning | RESPIRATORY_TRACT | Status: DC

## 2014-04-23 MED ORDER — FLUNISOLIDE HFA 80 MCG/ACT IN AERS: 2.0000 | INHALATION_SPRAY | Freq: Two times a day (BID) | RESPIRATORY_TRACT | Status: DC

## 2014-04-23 NOTE — Telephone Encounter (Signed)
stiolto is 2 puff same time once daily  aerospan - 2 puff twice daily

## 2014-04-23 NOTE — Telephone Encounter (Signed)
This will be explained to patient when she comes to pick up Rx.  Nothing further needed.

## 2014-05-13 ENCOUNTER — Telehealth: Payer: Self-pay | Admitting: Internal Medicine

## 2014-05-13 MED ORDER — UMECLIDINIUM-VILANTEROL 62.5-25 MCG/INH IN AEPB: INHALATION_SPRAY | RESPIRATORY_TRACT | Status: DC

## 2014-05-13 MED ORDER — FLUTICASONE PROPIONATE HFA 44 MCG/ACT IN AERO: 2.0000 | INHALATION_SPRAY | Freq: Two times a day (BID) | RESPIRATORY_TRACT | Status: DC

## 2014-05-13 NOTE — Telephone Encounter (Signed)
Called and spoke with pt and she is aware of samples that have been left up front for her to pick up. Nothing further is needed.

## 2014-05-22 ENCOUNTER — Telehealth: Payer: Self-pay | Admitting: Internal Medicine

## 2014-05-22 MED ORDER — DOXYCYCLINE HYCLATE 100 MG PO TABS: ORAL_TABLET | ORAL | Status: DC

## 2014-05-22 MED ORDER — PREDNISONE 10 MG PO TABS: ORAL_TABLET | ORAL | Status: DC

## 2014-05-22 NOTE — Telephone Encounter (Signed)
Called and spoke to pt. Informed pt of the recs per MR. Rx sent to preferred pharmacy. Pt verbalized understanding and denied any further questions or concerns at this time.  

## 2014-05-22 NOTE — Telephone Encounter (Signed)
[phone] returning call

## 2014-05-22 NOTE — Telephone Encounter (Signed)
AECOPD  Have her do  Please take prednisone 40 mg x1 day, then 30 mg x1 day, then 20 mg x1 day, then 10 mg x1 day, and then 5 mg x1 day and stop  And  Take doxycycline 100mg  po twice daily x 5 days; take after meals and avoid sunlight  If worse go to ER   Allergies  Allergen Reactions  . Nickel Rash    Severe rash to infection: pt is allergic to all metals other than sterling silver or gold jewelry.   . Lisinopril     cough  . Benicar [Olmesartan] Swelling  . Codeine Other (See Comments)    jittery  . [name] [Valsartan]   . Monosodium Glutamate     Facial swelling per pt

## 2014-05-22 NOTE — Telephone Encounter (Signed)
Pt returned call.  She c/o some head congestion, increased SOB, prod cough with thick yellow mucus, wheezing and tightness w/ exertion x3 days.  Denies f/c/s, n/v/d, hemoptysis, leg swelling.  Last ov w/ MR 10.14.15 - pt requesting recs over the phone rather than coming in for ov [facility] Allergies  Allergen Reactions  . Nickel Rash    Severe rash to infection: pt is allergic to all metals other than sterling silver or gold jewelry.   . Lisinopril     cough  . Benicar [Olmesartan] Swelling  . Codeine Other (See Comments)    jittery  . [name] [Valsartan]   . Monosodium Glutamate     Facial swelling per pt   MR please advise, thank you. **paged at [phone]

## 2014-05-22 NOTE — Telephone Encounter (Signed)
lmtcb for pt.  

## 2014-05-27 ENCOUNTER — Emergency Department (HOSPITAL_COMMUNITY): Payer: Medicare HMO

## 2014-05-27 ENCOUNTER — Encounter (HOSPITAL_COMMUNITY): Payer: Self-pay | Admitting: Emergency Medicine

## 2014-05-27 ENCOUNTER — Telehealth: Payer: Self-pay | Admitting: Internal Medicine

## 2014-05-27 ENCOUNTER — Inpatient Hospital Stay (HOSPITAL_COMMUNITY)
Admission: EM | Admit: 2014-05-27 | Discharge: 2014-05-29 | DRG: 192 | Disposition: A | Discharge status: Home / Self Care | Payer: Medicare HMO | Attending: Internal Medicine | Admitting: Internal Medicine

## 2014-05-27 DIAGNOSIS — Z9049 Acquired absence of other specified parts of digestive tract: Secondary | ICD-10-CM | POA: Diagnosis present

## 2014-05-27 DIAGNOSIS — E785 Hyperlipidemia, unspecified: Secondary | ICD-10-CM | POA: Diagnosis present

## 2014-05-27 DIAGNOSIS — E669 Obesity, unspecified: Secondary | ICD-10-CM | POA: Diagnosis present

## 2014-05-27 DIAGNOSIS — R0600 Dyspnea, unspecified: Secondary | ICD-10-CM

## 2014-05-27 DIAGNOSIS — Z91048 Other nonmedicinal substance allergy status: Secondary | ICD-10-CM

## 2014-05-27 DIAGNOSIS — Z7982 Long term (current) use of aspirin: Secondary | ICD-10-CM

## 2014-05-27 DIAGNOSIS — R0902 Hypoxemia: Secondary | ICD-10-CM | POA: Diagnosis present

## 2014-05-27 DIAGNOSIS — Z885 Allergy status to narcotic agent status: Secondary | ICD-10-CM | POA: Diagnosis not present

## 2014-05-27 DIAGNOSIS — Z888 Allergy status to other drugs, medicaments and biological substances status: Secondary | ICD-10-CM

## 2014-05-27 DIAGNOSIS — Z87891 Personal history of nicotine dependence: Secondary | ICD-10-CM | POA: Diagnosis not present

## 2014-05-27 DIAGNOSIS — R0602 Shortness of breath: Secondary | ICD-10-CM | POA: Diagnosis not present

## 2014-05-27 DIAGNOSIS — Z6837 Body mass index (BMI) 37.0-37.9, adult: Secondary | ICD-10-CM

## 2014-05-27 DIAGNOSIS — R197 Diarrhea, unspecified: Secondary | ICD-10-CM

## 2014-05-27 DIAGNOSIS — J45909 Unspecified asthma, uncomplicated: Secondary | ICD-10-CM | POA: Diagnosis present

## 2014-05-27 DIAGNOSIS — E66811 Obesity, class 1: Secondary | ICD-10-CM | POA: Diagnosis present

## 2014-05-27 DIAGNOSIS — Z8611 Personal history of tuberculosis: Secondary | ICD-10-CM

## 2014-05-27 DIAGNOSIS — I10 Essential (primary) hypertension: Secondary | ICD-10-CM | POA: Diagnosis present

## 2014-05-27 DIAGNOSIS — M1990 Unspecified osteoarthritis, unspecified site: Secondary | ICD-10-CM | POA: Diagnosis present

## 2014-05-27 DIAGNOSIS — J441 Chronic obstructive pulmonary disease with (acute) exacerbation: Secondary | ICD-10-CM | POA: Diagnosis present

## 2014-05-27 DIAGNOSIS — T380X5A Adverse effect of glucocorticoids and synthetic analogues, initial encounter: Secondary | ICD-10-CM | POA: Diagnosis present

## 2014-05-27 LAB — I-STAT TROPONIN, ED: TROPONIN I, POC: 0 ng/mL (ref 0.00–0.08)

## 2014-05-27 LAB — CBC
HCT: 44 % (ref 36.0–46.0)
Hemoglobin: 14.7 g/dL (ref 12.0–15.0)
MCH: 28.8 pg (ref 26.0–34.0)
MCHC: 33.4 g/dL (ref 30.0–36.0)
MCV: 86.3 fL (ref 78.0–100.0)
Platelets: 310 10*3/uL (ref 150–400)
RBC: 5.1 MIL/uL (ref 3.87–5.11)
RDW: 13.8 % (ref 11.5–15.5)
WBC: 12.6 10*3/uL — ABNORMAL HIGH (ref 4.0–10.5)

## 2014-05-27 LAB — BASIC METABOLIC PANEL
ANION GAP: 8 (ref 5–15)
BUN: 13 mg/dL (ref 6–23)
CO2: 31 mmol/L (ref 19–32)
CREATININE: 0.82 mg/dL (ref 0.50–1.10)
Calcium: 10.6 mg/dL — ABNORMAL HIGH (ref 8.4–10.5)
Chloride: 99 mEq/L (ref 96–112)
GFR, EST AFRICAN AMERICAN: 82 mL/min — AB (ref >90)
GFR, EST NON AFRICAN AMERICAN: 71 mL/min — AB (ref >90)
Glucose, Bld: 104 mg/dL — ABNORMAL HIGH (ref 70–99)
Potassium: 3.7 mmol/L (ref 3.5–5.1)
SODIUM: 138 mmol/L (ref 135–145)

## 2014-05-27 LAB — D-DIMER, QUANTITATIVE: D-Dimer, Quant: <0.27 ug/mL-FEU (ref 0.00–0.48)

## 2014-05-27 MED ORDER — HYDROCHLOROTHIAZIDE 25 MG PO TABS
25.0000 mg | ORAL_TABLET | Freq: Every day | ORAL | Status: DC
  Administered 2014-05-28 – 2014-05-29 (×2): 25 mg via ORAL
  Filled 2014-05-27 (×2): qty 1

## 2014-05-27 MED ORDER — ONDANSETRON HCL 4 MG PO TABS: 4.0000 mg | ORAL_TABLET | Freq: Four times a day (QID) | ORAL | Status: DC | PRN

## 2014-05-27 MED ORDER — UMECLIDINIUM-VILANTEROL 62.5-25 MCG/INH IN AEPB: 1.0000 | INHALATION_SPRAY | Freq: Every day | RESPIRATORY_TRACT | Status: DC

## 2014-05-27 MED ORDER — LEVOFLOXACIN IN D5W 750 MG/150ML IV SOLN
750.0000 mg | Freq: Every day | INTRAVENOUS | Status: DC
  Administered 2014-05-27 – 2014-05-28 (×2): 750 mg via INTRAVENOUS
  Filled 2014-05-27 (×3): qty 150

## 2014-05-27 MED ORDER — METHYLPREDNISOLONE SODIUM SUCC 125 MG IJ SOLR
125.0000 mg | Freq: Once | INTRAMUSCULAR | Status: AC
  Administered 2014-05-27: 125 mg via INTRAVENOUS
  Filled 2014-05-27: qty 2

## 2014-05-27 MED ORDER — AMLODIPINE BESYLATE 10 MG PO TABS
10.0000 mg | ORAL_TABLET | Freq: Every day | ORAL | Status: DC
  Administered 2014-05-28 – 2014-05-29 (×2): 10 mg via ORAL
  Filled 2014-05-27 (×2): qty 1

## 2014-05-27 MED ORDER — ALBUTEROL SULFATE (2.5 MG/3ML) 0.083% IN NEBU
2.5000 mg | INHALATION_SOLUTION | Freq: Once | RESPIRATORY_TRACT | Status: AC
  Administered 2014-05-27: 2.5 mg via RESPIRATORY_TRACT
  Filled 2014-05-27: qty 3

## 2014-05-27 MED ORDER — ACETAMINOPHEN 650 MG RE SUPP: 650.0000 mg | Freq: Four times a day (QID) | RECTAL | Status: DC | PRN

## 2014-05-27 MED ORDER — PRAVASTATIN SODIUM 10 MG PO TABS
10.0000 mg | ORAL_TABLET | Freq: Every day | ORAL | Status: DC
  Administered 2014-05-28: 10 mg via ORAL
  Filled 2014-05-27 (×2): qty 1

## 2014-05-27 MED ORDER — ALBUTEROL SULFATE (2.5 MG/3ML) 0.083% IN NEBU: 2.5000 mg | INHALATION_SOLUTION | RESPIRATORY_TRACT | Status: AC | PRN

## 2014-05-27 MED ORDER — FLUTICASONE PROPIONATE HFA 44 MCG/ACT IN AERO: 2.0000 | INHALATION_SPRAY | Freq: Two times a day (BID) | RESPIRATORY_TRACT | Status: DC

## 2014-05-27 MED ORDER — FLUTICASONE PROPIONATE HFA 110 MCG/ACT IN AERO
2.0000 | INHALATION_SPRAY | Freq: Two times a day (BID) | RESPIRATORY_TRACT | Status: DC
  Administered 2014-05-27 – 2014-05-29 (×4): 2 via RESPIRATORY_TRACT
  Filled 2014-05-27: qty 12

## 2014-05-27 MED ORDER — SACCHAROMYCES BOULARDII 250 MG PO CAPS
250.0000 mg | ORAL_CAPSULE | Freq: Two times a day (BID) | ORAL | Status: DC
  Administered 2014-05-27 – 2014-05-29 (×4): 250 mg via ORAL
  Filled 2014-05-27 (×4): qty 1

## 2014-05-27 MED ORDER — IPRATROPIUM-ALBUTEROL 0.5-2.5 (3) MG/3ML IN SOLN
3.0000 mL | RESPIRATORY_TRACT | Status: DC
  Administered 2014-05-28: 3 mL via RESPIRATORY_TRACT
  Filled 2014-05-27: qty 3

## 2014-05-27 MED ORDER — IPRATROPIUM-ALBUTEROL 0.5-2.5 (3) MG/3ML IN SOLN
3.0000 mL | Freq: Once | RESPIRATORY_TRACT | Status: AC
  Administered 2014-05-27: 3 mL via RESPIRATORY_TRACT
  Filled 2014-05-27: qty 3

## 2014-05-27 MED ORDER — ACETAMINOPHEN 325 MG PO TABS: 650.0000 mg | ORAL_TABLET | Freq: Four times a day (QID) | ORAL | Status: DC | PRN

## 2014-05-27 MED ORDER — ONDANSETRON HCL 4 MG/2ML IJ SOLN: 4.0000 mg | Freq: Four times a day (QID) | INTRAMUSCULAR | Status: DC | PRN

## 2014-05-27 MED ORDER — SODIUM CHLORIDE 0.9 % IJ SOLN
3.0000 mL | Freq: Two times a day (BID) | INTRAMUSCULAR | Status: DC
  Administered 2014-05-27 – 2014-05-29 (×3): 3 mL via INTRAVENOUS

## 2014-05-27 MED ORDER — HEPARIN SODIUM (PORCINE) 5000 UNIT/ML IJ SOLN
5000.0000 [IU] | Freq: Three times a day (TID) | INTRAMUSCULAR | Status: DC
  Administered 2014-05-27 – 2014-05-29 (×4): 5000 [IU] via SUBCUTANEOUS
  Filled 2014-05-27 (×6): qty 1

## 2014-05-27 MED ORDER — PREDNISONE 50 MG PO TABS
60.0000 mg | ORAL_TABLET | Freq: Every day | ORAL | Status: DC
  Administered 2014-05-28 – 2014-05-29 (×2): 60 mg via ORAL
  Filled 2014-05-27 (×2): qty 1

## 2014-05-27 NOTE — H&P (Signed)
[facility] History and Physical  Patient: Victoria [name]  XBM:[id number]  DOB: 22-Jun-1945  DOS: the patient was seen and examined on 05/27/2014 PCP: Scarlette Calico, MD  Chief Complaint: Shortness of breath  HPI: Victoria [name] is a 69 y.o. female with Past medical history of COPD, morbid obesity, nocturnal oxygen use, hypertensio. The patient presents with complaints of shortness of breath. She has chronic shortness of breath for which she has been worked up as an outpatient. She has been following up with the pulmonary for her COPD. Recently she started having complaints of cough with yellowish expectoration along with shortness of breath for which she call her pulmonary doctor who prescribed doxycycline and prednisone for her. She completed 1 week of treatment and despite which her symptoms were not getting better and therefore she called her pulmonary doctor again who recommended her to go to the ER. She denies any fever or chills denies any chest pain complaints of shortness of breath, leg swelling, orthopnea. Denies any PND. Denies any nausea or vomiting or recent travel. Denies any recent change in her medication. She has been found hypoxic initially in the ER and also had some wheezing initially in the ER.  The patient is coming from home. And at her baseline independent for most of her ADL.  Review of Systems: as mentioned in the history of present illness.  A Comprehensive review of the other systems is negative.  Past Medical History  Diagnosis Date  . Asthma     uses advair   . Arthritis     left ankle, right knee, right SI joint, wrists  . Varicella as child  . 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 Hypertension   . Hyperlipidemia   . Tuberculosis 2009    h/o positive PPD- seen at health department, had  normal chest x-ray. she was not treated for a  new conversion.  [name] COPD (chronic obstructive pulmonary disease)   . Murmur, cardiac 1950   Past Surgical  History  Procedure Laterality Date  . Excision of pelvic absess, right ovary      2008  . Appendectomy      2008  . Cesarean section      1972   Social History:  reports that she quit smoking about 3 years ago. Her smoking use included Cigarettes. She has a 20 pack-year smoking history. She has never used smokeless tobacco. She reports that she drinks alcohol. She reports that she does not use illicit drugs.  Allergies  Allergen Reactions  . Nickel Rash    Severe rash to infection: pt is allergic to all metals other than sterling silver or gold jewelry.   [name] [Olmesartan] Swelling    Swelling of face and arms   . [name] [Valsartan] Swelling    Swelling of face and arms   . Lisinopril Cough  . Codeine Other (See Comments)    jittery  . Monosodium Glutamate Other (See Comments)    Facial swelling per pt    Family History  Problem Relation Age of Onset  . Heart disease Mother     MI - fatal  . Hypertension Mother   . Stroke Father 37    fatal  . Alzheimer's disease Father   . Alzheimer's disease Brother   . Hyperlipidemia Brother   . Hypertension Brother   . Diabetes Brother   . Hypertension Brother   . Hyperlipidemia Brother     Prior to  Admission medications   Medication Sig Start Date End Date Taking? Authorizing Provider  albuterol (VENTOLIN HFA) 108 (90 BASE) MCG/ACT inhaler INHALE ONE PUFF EVERY 6 HOURS AS NEEDED FOR WHEEZING Patient taking differently: Inhale 1 puff into the lungs every 6 (six) hours as needed for wheezing.  08/02/13  Yes Neena Rhymes, MD  alendronate (FOSAMAX) 70 MG tablet Take 1 tablet (70 mg total) by mouth every 7 (seven) days. Take with a full glass of water on an empty stomach. Patient taking differently: Take 70 mg by mouth every 7 (seven) days. Take with a full glass of water on an empty stomach. Takes on Sunday's 02/28/14  Yes Renato Shin, MD  amLODipine (NORVASC) 10 MG tablet Take 1 tablet (10 mg total) by mouth daily. 03/28/14   Yes Janith Lima, MD  Aspirin-Calcium Carbonate (BAYER WOMENS) 81-300 MG TABS Take 1 tablet by mouth daily with breakfast.   Yes Historical Provider, MD  beclomethasone (QVAR) 80 MCG/ACT inhaler Inhale 2 puffs into the lungs 2 (two) times daily. Patient taking differently: Inhale 2 puffs into the lungs 2 (two) times daily as needed (for wheezing).  04/09/14  Yes Brand Males, MD  Cholecalciferol (VITAMIN D-3) 5000 UNITS TABS Take 1 tablet by mouth every morning.   Yes Historical Provider, MD  doxycycline (VIBRA-TABS) 100 MG tablet Take 1 tablet, twice daily for 5 days. Avoid sunlight and take after meals. 05/22/14  Yes Brand Males, MD  fluticasone (FLOVENT HFA) 110 MCG/ACT inhaler Inhale 2 puffs into the lungs 2 (two) times daily.   Yes Historical Provider, MD  hydrochlorothiazide (HYDRODIURIL) 25 MG tablet Take 1 tablet (25 mg total) by mouth daily. 03/04/14  Yes Janith Lima, MD  ibuprofen (ADVIL,MOTRIN) 200 MG tablet Take 400 mg by mouth every 6 (six) hours as needed for headache or moderate pain.   Yes Historical Provider, MD  lovastatin (MEVACOR) 20 MG tablet Take 1 tablet (20 mg total) by mouth daily. 03/28/14  Yes Janith Lima, MD  Magnesium Salicylate (DOANS PILLS) 325 MG TABS Take 2 tablets by mouth every 4 (four) hours as needed (for pain).   Yes Historical Provider, MD  Nutritional Supp - Diet Aids (CARB INTERCEPT/PHASE 2) CAPS Take 2 capsules by mouth 3 (three) times daily with meals as needed (for carb meals).   Yes Historical Provider, MD  OXYGEN Inhale 2 L into the lungs at bedtime.   Yes Historical Provider, MD  predniSONE (DELTASONE) 10 MG tablet 40 mg x1 day, then 30 mg x1 day, then 20 mg x1 day, then 10 mg x1 day, and then 5 mg x1 day and stop 05/22/14  Yes Brand Males, MD  Umeclidinium-Vilanterol (ANORO ELLIPTA) 62.5-25 MCG/INH AEPB Inhale 1 puff into the lungs daily. Patient taking differently: Inhale 1 puff into the lungs daily with breakfast.  04/09/14  Yes  Brand Males, MD  Flunisolide HFA (AEROSPAN) 80 MCG/ACT AERS Inhale 2 puffs into the lungs 2 (two) times daily. Patient not taking: Reported on 05/27/2014 04/23/14   Brand Males, MD  fluticasone (FLOVENT HFA) 44 MCG/ACT inhaler Inhale 2 puffs into the lungs 2 (two) times daily. Patient not taking: Reported on 05/27/2014 05/13/14   Brand Males, MD  Tiotropium Bromide-Olodaterol (STIOLTO RESPIMAT) 2.5-2.5 MCG/ACT AERS Inhale 2 puffs into the lungs every morning. Patient not taking: Reported on 05/27/2014 04/23/14   Brand Males, MD  Umeclidinium-Vilanterol [facility] ELLIPTA) 62.5-25 MCG/INH AEPB INHALE ONE PUFF ONCE DAILY Patient not taking: Reported on 05/27/2014 05/13/14   Belva Crome  Chase Caller, MD    Physical Exam: Filed Vitals:   05/27/14 2100 05/27/14 2130 05/27/14 2200 05/27/14 2236  BP: 148/66 159/68 156/65 154/72  Pulse: 92 90 91 96  Temp:    98.2 F (36.8 C)  TempSrc:    Oral  Resp:  24 22 20   Height:    5\' 5"  (1.651 m)  Weight:    101.651 kg (224 lb 1.6 oz)  SpO2: 92% 93% 93% 96%    General: Alert, Awake and Oriented to Time, Place and Person. Appear in mild distress Eyes: PERRL ENT: Oral Mucosa clear moist. Neck: no JVD Cardiovascular: S1 and S2 Present, aortic systolic Murmur, Peripheral Pulses Present Respiratory: Bilateral Air entry equal and Decreased, no Crackles, occasional wheezes Abdomen: Bowel Sound present, Soft and non tender Skin: no Rash Extremities: Bilateral Pedal edema, non calf tenderness Neurologic: Grossly no focal neuro deficit.  Labs on Admission:  CBC:  Recent Labs Lab 05/27/14 1339  WBC 12.6*  HGB 14.7  HCT 44.0  MCV 86.3  PLT 310    CMP     Component Value Date/Time   NA 138 05/27/2014 1339   K 3.7 05/27/2014 1339   CL 99 05/27/2014 1339   CO2 31 05/27/2014 1339   GLUCOSE 104* 05/27/2014 1339   BUN 13 05/27/2014 1339   CREATININE 0.82 05/27/2014 1339   CALCIUM 10.6* 05/27/2014 1339   PROT 7.0 02/11/2014 1521   ALBUMIN  3.9 02/11/2014 1521   AST 16 02/11/2014 1521   ALT 5 02/11/2014 1521   ALKPHOS 100 02/11/2014 1521   BILITOT 0.3 02/11/2014 1521   GFRNONAA 71* 05/27/2014 1339   GFRAA 82* 05/27/2014 1339    No results for input(s): LIPASE, AMYLASE in the last 168 hours. No results for input(s): AMMONIA in the last 168 hours.  No results for input(s): CKTOTAL, CKMB, CKMBINDEX, TROPONINI in the last 168 hours. BNP (last 3 results) No results for input(s): PROBNP in the last 8760 hours.  Radiological Exams on Admission: Dg Chest 2 View (if Patient Has Fever And/or Copd)  05/27/2014   CLINICAL DATA:  Shortness of breath, dizziness.  EXAM: CHEST  2 VIEW  COMPARISON:  April 23, 2013.  FINDINGS: The heart size and mediastinal contours are within normal limits. No pneumothorax or pleural effusion is noted. Stable bibasilar densities are noted most consistent with scarring. No acute pulmonary disease is noted. The visualized skeletal structures are unremarkable.  IMPRESSION: Stable bibasilar scarring. No acute cardiopulmonary abnormality seen.   Electronically Signed   By: Sabino Dick M.D.   On: 05/27/2014 14:20   EKG: Independently reviewed. normal sinus rhythm, nonspecific ST and T waves changes.  Assessment/Plan Principal Problem:   COPD exacerbation Active Problems:   Obesity (BMI 30.0-34.9)   Essential hypertension   1. COPD exacerbation Patient is presenting with complaints of shortness of breath with cough. She has yellow-green expectoration. Chest x-ray does not show any pneumonia and does not have any fever. Exam shows mild wheezing. With this the patient is currently admitted in the hospital due to her hypoxemia. Continue her on units, prednisolone, levofloxacin, Follow urine cultures and influenza PCR. Follow sputum culture.  2. Hypertension. Continue home medications.  3. Diarrhea. Check C. difficile. Add probiotics.  4. Dyslipidemia. Continue statin.  Advance goals of care  discussion:  full code   DVT Prophylaxis: subcutaneous Heparin Nutrition:  Cardiac diet  Disposition: Admitted to inpatient in telemetry unit.  Author: Berle Mull, MD [facility] Pager: [phone] 05/27/2014, 11:55 PM  If 7PM-7AM, please contact night-coverage [email or web address] Password [email or web address]

## 2014-05-27 NOTE — Telephone Encounter (Signed)
Pt states that she is going to go to ED to be evaluated. Will let Dr Chase Caller know. Nothing further needed.

## 2014-05-27 NOTE — Telephone Encounter (Signed)
Called and spoke to pt. Pt stated she feels she has improved since Wed. 05/22/14 when she was given pred taper and abx. Pt stated she has just finished both pred and abx. Pt stated she still is getting SOB with activity, dry cough and mild dizziness. Pt denies CP/tightness, f/c/s and chest congestion. There is a slot open on your schedule today (05/27/14) which will bring today's census to 13 pt's.  MR please advise.   Allergies  Allergen Reactions  . Nickel Rash    Severe rash to infection: pt is allergic to all metals other than sterling silver or gold jewelry.   . Lisinopril     cough  . Benicar [Olmesartan] Swelling  . Codeine Other (See Comments)    jittery  . [name] [Valsartan]   . Monosodium Glutamate     Facial swelling per pt

## 2014-05-27 NOTE — ED Provider Notes (Signed)
CSN: [id number]     Arrival date & time 05/27/14  1322 History   First MD Initiated Contact with Patient 05/27/14 1629     Chief Complaint  Patient presents with  . Shortness of Breath  . Dizziness     (Consider location/radiation/quality/duration/timing/severity/associated sxs/prior Treatment) HPI Comments: Patient with a history of COPD presents with shortness of breath and cough. She states her cough is been going on for about 2 weeks. Last week she took a 5 day course of doxycycline and a five-day course of prednisone. She states that she still feeling short of breath and she still having a cough. Her cough was productive of some yellow sputum but now is more clear. She denies any fevers or chills. She feels her lungs are tight but she denies any chest pain. She denies any leg pain or swelling. She denies any recent immobilization. She saw her pulmonologist last week to start her on the doxycycline and prednisone. This morning she had some increased shortness of breath on exertion and had a little bit of lightheadedness and came here for evaluation.  Patient is a 69 y.o. female presenting with shortness of breath and dizziness.  Shortness of Breath Associated symptoms: cough   Associated symptoms: no abdominal pain, no chest pain, no diaphoresis, no fever, no headaches, no rash and no vomiting   Dizziness Associated symptoms: shortness of breath   Associated symptoms: no blood in stool, no chest pain, no diarrhea, no headaches, no nausea and no vomiting     Past Medical History  Diagnosis Date  . Asthma     uses advair   . Arthritis     left ankle, right knee, right SI joint, wrists  . Varicella as child  . 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 Hypertension   . Hyperlipidemia   . Tuberculosis 2009    h/o positive PPD- seen at health department, had  normal chest x-ray. she was not treated for a  new conversion.  [name] COPD (chronic obstructive pulmonary disease)   . Murmur,  cardiac 1950   Past Surgical History  Procedure Laterality Date  . Excision of pelvic absess, right ovary      2008  . Appendectomy      2008  . Cesarean section      1972   Family History  Problem Relation Age of Onset  . Heart disease Mother     MI - fatal  . Hypertension Mother   . Stroke Father 59    fatal  . Alzheimer's disease Father   . Alzheimer's disease Brother   . Hyperlipidemia Brother   . Hypertension Brother   . Diabetes Brother   . Hypertension Brother   . Hyperlipidemia Brother    History  Substance Use Topics  . Smoking status: Former Smoker -- 0.50 packs/day for 40 years    Types: Cigarettes    Quit date: 04/16/2011  . Smokeless tobacco: Never Used  . Alcohol Use: No     Comment: rare occasion   OB History    No data available     Review of Systems  Constitutional: Positive for fatigue. Negative for fever, chills and diaphoresis.  HENT: Negative for congestion, rhinorrhea and sneezing.   Eyes: Negative.   Respiratory: Positive for cough and shortness of breath. Negative for chest tightness.   Cardiovascular: Negative for chest pain and leg swelling.  Gastrointestinal: Negative for nausea, vomiting, abdominal pain, diarrhea and blood in  stool.  Genitourinary: Negative for frequency, hematuria, flank pain and difficulty urinating.  Musculoskeletal: Negative for back pain and arthralgias.  Skin: Negative for rash.  Neurological: Positive for light-headedness. Negative for speech difficulty, weakness, numbness and headaches.      Allergies  Nickel; Benicar; [name]; Lisinopril; Codeine; and Monosodium glutamate  Home Medications   Prior to Admission medications   Medication Sig Start Date End Date Taking? Authorizing Provider  albuterol (VENTOLIN HFA) 108 (90 BASE) MCG/ACT inhaler INHALE ONE PUFF EVERY 6 HOURS AS NEEDED FOR WHEEZING Patient taking differently: Inhale 1 puff into the lungs every 6 (six) hours as needed for wheezing.  08/02/13   Yes Neena Rhymes, MD  alendronate (FOSAMAX) 70 MG tablet Take 1 tablet (70 mg total) by mouth every 7 (seven) days. Take with a full glass of water on an empty stomach. Patient taking differently: Take 70 mg by mouth every 7 (seven) days. Take with a full glass of water on an empty stomach. Takes on Sunday's 02/28/14  Yes Renato Shin, MD  amLODipine (NORVASC) 10 MG tablet Take 1 tablet (10 mg total) by mouth daily. 03/28/14  Yes Janith Lima, MD  Aspirin-Calcium Carbonate (BAYER WOMENS) 81-300 MG TABS Take 1 tablet by mouth daily with breakfast.   Yes Historical Provider, MD  beclomethasone (QVAR) 80 MCG/ACT inhaler Inhale 2 puffs into the lungs 2 (two) times daily. Patient taking differently: Inhale 2 puffs into the lungs 2 (two) times daily as needed (for wheezing).  04/09/14  Yes Brand Males, MD  Cholecalciferol (VITAMIN D-3) 5000 UNITS TABS Take 1 tablet by mouth every morning.   Yes Historical Provider, MD  doxycycline (VIBRA-TABS) 100 MG tablet Take 1 tablet, twice daily for 5 days. Avoid sunlight and take after meals. 05/22/14  Yes Brand Males, MD  fluticasone (FLOVENT HFA) 110 MCG/ACT inhaler Inhale 2 puffs into the lungs 2 (two) times daily.   Yes Historical Provider, MD  hydrochlorothiazide (HYDRODIURIL) 25 MG tablet Take 1 tablet (25 mg total) by mouth daily. 03/04/14  Yes Janith Lima, MD  ibuprofen (ADVIL,MOTRIN) 200 MG tablet Take 400 mg by mouth every 6 (six) hours as needed for headache or moderate pain.   Yes Historical Provider, MD  lovastatin (MEVACOR) 20 MG tablet Take 1 tablet (20 mg total) by mouth daily. 03/28/14  Yes Janith Lima, MD  Magnesium Salicylate (DOANS PILLS) 325 MG TABS Take 2 tablets by mouth every 4 (four) hours as needed (for pain).   Yes Historical Provider, MD  Nutritional Supp - Diet Aids (CARB INTERCEPT/PHASE 2) CAPS Take 2 capsules by mouth 3 (three) times daily with meals as needed (for carb meals).   Yes Historical Provider, MD  OXYGEN  Inhale 2 L into the lungs at bedtime.   Yes Historical Provider, MD  predniSONE (DELTASONE) 10 MG tablet 40 mg x1 day, then 30 mg x1 day, then 20 mg x1 day, then 10 mg x1 day, and then 5 mg x1 day and stop 05/22/14  Yes Brand Males, MD  Umeclidinium-Vilanterol (ANORO ELLIPTA) 62.5-25 MCG/INH AEPB Inhale 1 puff into the lungs daily. Patient taking differently: Inhale 1 puff into the lungs daily with breakfast.  04/09/14  Yes Brand Males, MD  Flunisolide HFA (AEROSPAN) 80 MCG/ACT AERS Inhale 2 puffs into the lungs 2 (two) times daily. Patient not taking: Reported on 05/27/2014 04/23/14   Brand Males, MD  fluticasone (FLOVENT HFA) 44 MCG/ACT inhaler Inhale 2 puffs into the lungs 2 (two) times daily. Patient not  taking: Reported on 05/27/2014 05/13/14   Brand Males, MD  Tiotropium Bromide-Olodaterol (STIOLTO RESPIMAT) 2.5-2.5 MCG/ACT AERS Inhale 2 puffs into the lungs every morning. Patient not taking: Reported on 05/27/2014 04/23/14   Brand Males, MD  Umeclidinium-Vilanterol [facility] ELLIPTA) 62.5-25 MCG/INH AEPB INHALE ONE PUFF ONCE DAILY Patient not taking: Reported on 05/27/2014 05/13/14   Brand Males, MD   BP 154/59 mmHg  Pulse 93  Temp(Src) 97.9 F (36.6 C) (Oral)  Resp 18  SpO2 93% Physical Exam  Constitutional: She is oriented to person, place, and time. She appears well-developed and well-nourished.  HENT:  Head: Normocephalic and atraumatic.  Eyes: Pupils are equal, round, and reactive to light.  Neck: Normal range of motion. Neck supple.  Cardiovascular: Normal rate, regular rhythm and normal heart sounds.   Pulmonary/Chest: Effort normal. No respiratory distress. She has wheezes. She has no rales. She exhibits no tenderness.  Mildly diminished breath sounds bilaterally with Wheezes. She's talking in full sentences with no increased work of breathing  Abdominal: Soft. Bowel sounds are normal. There is no tenderness. There is no rebound and no guarding.   Musculoskeletal: Normal range of motion. She exhibits no edema.  No calf tenderness  Lymphadenopathy:    She has no cervical adenopathy.  Neurological: She is alert and oriented to person, place, and time.  Skin: Skin is warm and dry. No rash noted.  Psychiatric: She has a normal mood and affect.    ED Course  Procedures (including critical care time) Labs Review Results for orders placed or performed during the hospital encounter of 05/25/30  Basic metabolic panel    (if pt has PMH of COPD)  Result Value Ref Range   Sodium 138 135 - 145 mmol/L   Potassium 3.7 3.5 - 5.1 mmol/L   Chloride 99 96 - 112 mEq/L   CO2 31 19 - 32 mmol/L   Glucose, Bld 104 (H) 70 - 99 mg/dL   BUN 13 6 - 23 mg/dL   Creatinine, Ser 0.82 0.50 - 1.10 mg/dL   Calcium 10.6 (H) 8.4 - 10.5 mg/dL   GFR calc non Af Amer 71 (L) >90 mL/min   GFR calc Af Amer 82 (L) >90 mL/min   Anion gap 8 5 - 15  CBC     (if pt has PMH of COPD)  Result Value Ref Range   WBC 12.6 (H) 4.0 - 10.5 K/uL   RBC 5.10 3.87 - 5.11 MIL/uL   Hemoglobin 14.7 12.0 - 15.0 g/dL   HCT 44.0 36.0 - 46.0 %   MCV 86.3 78.0 - 100.0 fL   MCH 28.8 26.0 - 34.0 pg   MCHC 33.4 30.0 - 36.0 g/dL   RDW 13.8 11.5 - 15.5 %   Platelets 310 150 - 400 K/uL  D-dimer, quantitative  Result Value Ref Range   D-Dimer, Quant <0.27 0.00 - 0.48 ug/mL-FEU  I-stat troponin, ED (if patient has history of COPD)  Result Value Ref Range   Troponin i, poc 0.00 0.00 - 0.08 ng/mL   Comment 3           Dg Chest 2 View (if Patient Has Fever And/or Copd)  05/27/2014   CLINICAL DATA:  Shortness of breath, dizziness.  EXAM: CHEST  2 VIEW  COMPARISON:  April 23, 2013.  FINDINGS: The heart size and mediastinal contours are within normal limits. No pneumothorax or pleural effusion is noted. Stable bibasilar densities are noted most consistent with scarring. No acute pulmonary disease is noted.  The visualized skeletal structures are unremarkable.  IMPRESSION: Stable bibasilar  scarring. No acute cardiopulmonary abnormality seen.   Electronically Signed   By: Sabino Dick M.D.   On: 05/27/2014 14:20      Imaging Review Dg Chest 2 View (if Patient Has Fever And/or Copd)  05/27/2014   CLINICAL DATA:  Shortness of breath, dizziness.  EXAM: CHEST  2 VIEW  COMPARISON:  April 23, 2013.  FINDINGS: The heart size and mediastinal contours are within normal limits. No pneumothorax or pleural effusion is noted. Stable bibasilar densities are noted most consistent with scarring. No acute pulmonary disease is noted. The visualized skeletal structures are unremarkable.  IMPRESSION: Stable bibasilar scarring. No acute cardiopulmonary abnormality seen.   Electronically Signed   By: Sabino Dick M.D.   On: 05/27/2014 14:20     EKG Interpretation   Date/Time:  Monday May 27 2014 13:30:45 EST Ventricular Rate:  75 PR Interval:  164 QRS Duration: 104 QT Interval:  372 QTC Calculation: 415 R Axis:   -59 Text Interpretation:  Sinus rhythm Left anterior fascicular block Abnormal  R-wave progression, early transition Consider anterior infarct Nonspecific  repol abnormality, lateral leads Baseline wander in lead(s) I III aVL aVF  V4 V5 V6 since last tracing no significant change Confirmed by Yoltzin Ransom  MD,  Larine Fielding ([id number]) on 05/27/2014 9:03:50 PM      MDM   Final diagnoses:  COPD exacerbation    Patient presents with wheezing and shortness of breath. She got nebulizer treatments and slight Medrol here in the ED. There is no evidence of pneumonia. Her d-dimer is negative and there is no other suggestions of pulmonary embolus. Her oxygen saturation still drops to the mid 80s off oxygen. She states in the low 90s on oxygen at 2 L/m. She's does not have any increased work of breathing. However given her oxygen requirement I felt this warrants admission for further treatment of her COPD exacerbation. She's failed outpatient treatment. I will consult the hospitalist for  admission.    Malvin Johns, MD 05/27/14 2106

## 2014-05-27 NOTE — Telephone Encounter (Signed)
I cannot see her 05/27/2014 - that is too high a censue. She needs to go to ER if she is not better because seh has failed opd Rx

## 2014-05-27 NOTE — ED Notes (Signed)
Pt states that she has COPD and was having trouble last week and was given prednisone and antibiotic last week from PCP and finished the medications. Pt states that she has been having SHOB and dizziness that has been going on.  Pt has turned heat off in a spare bedroom where she keeps her computer and got a chill after being in there.and this morning she states that she first time "felt like i had a fever".

## 2014-05-28 DIAGNOSIS — I517 Cardiomegaly: Secondary | ICD-10-CM

## 2014-05-28 DIAGNOSIS — I10 Essential (primary) hypertension: Secondary | ICD-10-CM

## 2014-05-28 DIAGNOSIS — E669 Obesity, unspecified: Secondary | ICD-10-CM

## 2014-05-28 LAB — STREP PNEUMONIAE URINARY ANTIGEN: Strep Pneumo Urinary Antigen: NEGATIVE

## 2014-05-28 LAB — COMPREHENSIVE METABOLIC PANEL
ALK PHOS: 94 U/L (ref 39–117)
ALT: 10 U/L (ref 0–35)
ANION GAP: 11 (ref 5–15)
AST: 21 U/L (ref 0–37)
Albumin: 3.8 g/dL (ref 3.5–5.2)
BILIRUBIN TOTAL: 0.3 mg/dL (ref 0.3–1.2)
BUN: 22 mg/dL (ref 6–23)
CO2: 27 mmol/L (ref 19–32)
Calcium: 10.1 mg/dL (ref 8.4–10.5)
Chloride: 97 mEq/L (ref 96–112)
Creatinine, Ser: 0.94 mg/dL (ref 0.50–1.10)
GFR calc Af Amer: 70 mL/min — ABNORMAL LOW (ref >90)
GFR, EST NON AFRICAN AMERICAN: 60 mL/min — AB (ref >90)
GLUCOSE: 270 mg/dL — AB (ref 70–99)
Potassium: 3.5 mmol/L (ref 3.5–5.1)
SODIUM: 135 mmol/L (ref 135–145)
Total Protein: 7.4 g/dL (ref 6.0–8.3)

## 2014-05-28 LAB — PROTIME-INR
INR: 1.01 (ref 0.00–1.49)
PROTHROMBIN TIME: 13.4 s (ref 11.6–15.2)

## 2014-05-28 LAB — CBC
HEMATOCRIT: 43.8 % (ref 36.0–46.0)
HEMOGLOBIN: 14.5 g/dL (ref 12.0–15.0)
MCH: 28.7 pg (ref 26.0–34.0)
MCHC: 33.1 g/dL (ref 30.0–36.0)
MCV: 86.6 fL (ref 78.0–100.0)
PLATELETS: 308 10*3/uL (ref 150–400)
RBC: 5.06 MIL/uL (ref 3.87–5.11)
RDW: 13.8 % (ref 11.5–15.5)
WBC: 11.8 10*3/uL — ABNORMAL HIGH (ref 4.0–10.5)

## 2014-05-28 MED ORDER — GUAIFENESIN ER 600 MG PO TB12
1200.0000 mg | ORAL_TABLET | Freq: Two times a day (BID) | ORAL | Status: DC
  Administered 2014-05-28 – 2014-05-29 (×3): 1200 mg via ORAL
  Filled 2014-05-28 (×3): qty 2

## 2014-05-28 MED ORDER — FUROSEMIDE 10 MG/ML IJ SOLN
40.0000 mg | Freq: Once | INTRAMUSCULAR | Status: AC
  Administered 2014-05-28: 40 mg via INTRAVENOUS
  Filled 2014-05-28: qty 4

## 2014-05-28 MED ORDER — IPRATROPIUM-ALBUTEROL 0.5-2.5 (3) MG/3ML IN SOLN
3.0000 mL | Freq: Four times a day (QID) | RESPIRATORY_TRACT | Status: DC
  Administered 2014-05-28 – 2014-05-29 (×5): 3 mL via RESPIRATORY_TRACT
  Filled 2014-05-28 (×6): qty 3

## 2014-05-28 MED ORDER — GUAIFENESIN-DM 100-10 MG/5ML PO SYRP: 5.0000 mL | ORAL_SOLUTION | ORAL | Status: DC | PRN

## 2014-05-28 MED ORDER — POTASSIUM CHLORIDE CRYS ER 20 MEQ PO TBCR
40.0000 meq | EXTENDED_RELEASE_TABLET | Freq: Once | ORAL | Status: AC
  Administered 2014-05-28: 40 meq via ORAL
  Filled 2014-05-28: qty 2

## 2014-05-28 MED ORDER — ZOLPIDEM TARTRATE 5 MG PO TABS
5.0000 mg | ORAL_TABLET | Freq: Once | ORAL | Status: AC
  Administered 2014-05-29: 5 mg via ORAL
  Filled 2014-05-28: qty 1

## 2014-05-28 MED ORDER — PERFLUTREN LIPID MICROSPHERE
1.0000 mL | INTRAVENOUS | Status: AC | PRN
  Administered 2014-05-28: 2 mL via INTRAVENOUS
  Filled 2014-05-28: qty 10

## 2014-05-28 NOTE — Progress Notes (Signed)
Inpatient Diabetes Program Recommendations  AACE/ADA: New Consensus Statement on Inpatient Glycemic Control (2013)  Target Ranges:  Prepandial:   less than 140 mg/dL      Peak postprandial:   less than 180 mg/dL (1-2 hours)      Critically ill patients:  140 - 180 mg/dL   Reason for Assessment:  Results for Victoria [name], Victoria [name] (MRN [id number]) as of 05/28/2014 10:16  Ref. Range 05/27/2014 13:39 05/28/2014 03:30  Glucose Latest Range: 70-99 mg/dL 104 (H) 270 (H)    Diabetes history:None- Prednisone 60 mg with breakfast Outpatient Diabetes medications: None Current orders for Inpatient glycemic control:  Note elevated lab glucose this morning.  May consider checking CBG's tid with meals and HS.  If greater than 150 mg/dL, may consider adding Novolog correction.   Thanks, Adah Perl, RN, BC-ADM Inpatient Diabetes Coordinator Pager [phone]

## 2014-05-28 NOTE — Progress Notes (Signed)
ANTIBIOTIC CONSULT NOTE - INITIAL  Pharmacy Consult for Levofloxacin Indication: COPD Exac.  Allergies  Allergen Reactions  . Nickel Rash    Severe rash to infection: pt is allergic to all metals other than sterling silver or gold jewelry.   [name] [Olmesartan] Swelling    Swelling of face and arms   . [name] [Valsartan] Swelling    Swelling of face and arms   . Lisinopril Cough  . Codeine Other (See Comments)    jittery  . Monosodium Glutamate Other (See Comments)    Facial swelling per pt    Patient Measurements: Height: 5\' 5"  (165.1 cm) Weight: 224 lb 1.6 oz (101.651 kg) IBW/kg (Calculated) : 57 Adjusted Body Weight:   Vital Signs: Temp: 98.2 F (36.8 C) (01/11 2236) Temp Source: Oral (01/11 2236) BP: 154/72 mmHg (01/11 2236) Pulse Rate: 96 (01/11 2236) Intake/Output from previous day: 01/11 0701 - 01/12 0700 In: 150 [IV Piggyback:150] Out: 150 [Urine:150] Intake/Output from this shift: Total I/O In: 150 [IV Piggyback:150] Out: 150 [Urine:150]  Labs:  Recent Labs  05/27/14 1339  WBC 12.6*  HGB 14.7  PLT 310  CREATININE 0.82   Estimated Creatinine Clearance: 75.5 mL/min (by C-G formula based on Cr of 0.82). No results for input(s): VANCOTROUGH, VANCOPEAK, VANCORANDOM, GENTTROUGH, GENTPEAK, GENTRANDOM, TOBRATROUGH, TOBRAPEAK, TOBRARND, AMIKACINPEAK, AMIKACINTROU, AMIKACIN in the last 72 hours.   Microbiology: No results found for this or any previous visit (from the past 720 hour(s)).  Medical History: Past Medical History  Diagnosis Date  . Asthma     uses advair   . Arthritis     left ankle, right knee, right SI joint, wrists  . Varicella as child  . 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 Hypertension   . Hyperlipidemia   . Tuberculosis 2009    h/o positive PPD- seen at health department, had  normal chest x-ray. she was not treated for a  new conversion.  [name] COPD (chronic obstructive pulmonary disease)   . Murmur, cardiac 1950     Medications:  Anti-infectives    Start     Dose/Rate Route Frequency Ordered Stop   05/27/14 2230  levofloxacin (LEVAQUIN) IVPB 750 mg     750 mg100 mL/hr over 90 Minutes Intravenous Daily at bedtime 05/27/14 2225       Assessment: Patient with COPD exac.  First dose of antibiotics already given.  Goal of Therapy:  Levofloxacin dosed based on patient weight and renal function     Plan: Levofloxacin 750mg  iv q24hr Follow up culture results  Nani Skillern Crowford 05/28/2014,3:13 AM

## 2014-05-28 NOTE — Progress Notes (Signed)
[facility] PROGRESS NOTE   Victoria [name] IDC:[id number] DOB: 02/03/1946 DOA: 05/27/2014 PCP: Scarlette Calico, MD  HPI/Subjective: Feels better than yesterday, less cough, less sputum production and almost no wheezing.  Assessment/Plan: Principal Problem:   COPD exacerbation Active Problems:   Obesity (BMI 30.0-34.9)   Essential hypertension    Acute COPD exacerbation Presented with shortness of breath, cough and sputum production. Patient treated with doxycycline and prednisone taper as outpatient without significant improvement. Admitted to the hospital for further evaluation, levofloxacin and steroids. Supportive management with bronchodilators, mucolytics, antitussives and oxygen as needed.  Hypoxia Documented oxygen saturation of 89% on room air, patient currently on 2 L of oxygen. Wean oxygen to room air as tolerated.  Essential hypertension Continue home medications, episodes of systolic blood pressure more than 150, treat with when necessary IV hydralazine.  Lower extremity edema Trace lower extremity edema, patient mentioning exertional dyspnea which is likely secondary to COPD. Obtain 2-D echocardiogram. Give Lasix to keep intake/output in the negative side.  Code Status: Full code Family Communication: Plan discussed with the patient. Disposition Plan: Remains inpatient   Consultants:  None  Procedures:  None  Antibiotics:  Levofloxacin   Objective: Filed Vitals:   05/28/14 0355  BP: 170/72  Pulse: 90  Temp: 97.8 F (36.6 C)  Resp: 18    Intake/Output Summary (Last 24 hours) at 05/28/14 1155 Last data filed at 05/28/14 0900  Gross per 24 hour  Intake    270 ml  Output    900 ml  Net   -630 ml   Filed Weights   05/27/14 2236 05/28/14 0500  Weight: 101.651 kg (224 lb 1.6 oz) 101.65 kg (224 lb 1.6 oz)    Exam: General: Alert and awake, oriented x3, not in any acute distress. HEENT: anicteric sclera, pupils reactive to light and  accommodation, EOMI CVS: S1-S2 clear, no murmur rubs or gallops Chest: clear to auscultation bilaterally, no wheezing, rales or rhonchi Abdomen: soft nontender, nondistended, normal bowel sounds, no organomegaly Extremities: no cyanosis, clubbing or edema noted bilaterally Neuro: Cranial nerves II-XII intact, no focal neurological deficits  Data Reviewed: Basic Metabolic Panel:  Recent Labs Lab 05/27/14 1339 05/28/14 0330  NA 138 135  K 3.7 3.5  CL 99 97  CO2 31 27  GLUCOSE 104* 270*  BUN 13 22  CREATININE 0.82 0.94  CALCIUM 10.6* 10.1   Liver Function Tests:  Recent Labs Lab 05/28/14 0330  AST 21  ALT 10  ALKPHOS 94  BILITOT 0.3  PROT 7.4  ALBUMIN 3.8   No results for input(s): LIPASE, AMYLASE in the last 168 hours. No results for input(s): AMMONIA in the last 168 hours. CBC:  Recent Labs Lab 05/27/14 1339 05/28/14 0330  WBC 12.6* 11.8*  HGB 14.7 14.5  HCT 44.0 43.8  MCV 86.3 86.6  PLT 310 308   Cardiac Enzymes: No results for input(s): CKTOTAL, CKMB, CKMBINDEX, TROPONINI in the last 168 hours. BNP (last 3 results) No results for input(s): PROBNP in the last 8760 hours. CBG: No results for input(s): GLUCAP in the last 168 hours.  Micro No results found for this or any previous visit (from the past 240 hour(s)).   Studies: Dg Chest 2 View (if Patient Has Fever And/or Copd)  05/27/2014   CLINICAL DATA:  Shortness of breath, dizziness.  EXAM: CHEST  2 VIEW  COMPARISON:  April 23, 2013.  FINDINGS: The heart size and mediastinal contours are within normal limits. No pneumothorax or pleural effusion  is noted. Stable bibasilar densities are noted most consistent with scarring. No acute pulmonary disease is noted. The visualized skeletal structures are unremarkable.  IMPRESSION: Stable bibasilar scarring. No acute cardiopulmonary abnormality seen.   Electronically Signed   By: Sabino Dick M.D.   On: 05/27/2014 14:20    Scheduled Meds: . amLODipine  10 mg  Oral Daily  . fluticasone  2 puff Inhalation BID  . heparin  5,000 Units Subcutaneous 3 times per day  . hydrochlorothiazide  25 mg Oral Daily  . ipratropium-albuterol  3 mL Nebulization Q6H  . levofloxacin (LEVAQUIN) IV  750 mg Intravenous QHS  . pravastatin  10 mg Oral q1800  . predniSONE  60 mg Oral Q breakfast  . saccharomyces boulardii  250 mg Oral BID  . sodium chloride  3 mL Intravenous Q12H   Continuous Infusions:      Time spent: 35 minutes    [facility]  [facility] Pager [phone] If 7PM-7AM, please contact night-coverage at [email or web address], password [facility] 05/28/2014, 11:55 AM  LOS: 1 day

## 2014-05-28 NOTE — Care Management Note (Addendum)
    Page 1 of 1   05/29/2014     1:17:12 PM CARE MANAGEMENT NOTE 05/29/2014  Patient:  Victoria [name], Victoria [name]   Account Number:  [id number]  Date Initiated:  05/28/2014  Documentation initiated by:  Dessa Phi  Subjective/Objective Assessment:   69 y/o f admitted w/COPD exac.YV:OPFY.     Action/Plan:   From home.   Anticipated DC Date:  05/29/2014   Anticipated DC Plan:  [place]  CM consult      Choice offered to / List presented to:             Status of service:  Completed, signed off Medicare Important Message given?   (If response is "NO", the following Medicare IM given date fields will be blank) Date Medicare IM given:   Medicare IM given by:   Date Additional Medicare IM given:   Additional Medicare IM given by:    Discharge Disposition:  HOME/SELF CARE  Per UR Regulation:  Reviewed for med. necessity/level of care/duration of stay  If discussed at [place] of Stay Meetings, dates discussed:    Comments:  05/29/14 Dessa Phi RN BSN NCM [phone] d/c home no needs or orders.  05/28/14 Dessa Phi RN BSN NCM [phone] No anticipated d/c needs.

## 2014-05-28 NOTE — Progress Notes (Signed)
BP 170/72, HR 90. K Schorr,NP text paged with findings. Will continue to monitor.

## 2014-05-28 NOTE — Progress Notes (Signed)
  Echocardiogram 2D Echocardiogram has been performed.  Darlina Sicilian M 05/28/2014, 2:55 PM

## 2014-05-29 DIAGNOSIS — J441 Chronic obstructive pulmonary disease with (acute) exacerbation: Principal | ICD-10-CM

## 2014-05-29 LAB — LEGIONELLA ANTIGEN, URINE

## 2014-05-29 LAB — CBC
HCT: 40.4 % (ref 36.0–46.0)
Hemoglobin: 13.2 g/dL (ref 12.0–15.0)
MCH: 28.6 pg (ref 26.0–34.0)
MCHC: 32.7 g/dL (ref 30.0–36.0)
MCV: 87.4 fL (ref 78.0–100.0)
Platelets: 286 10*3/uL (ref 150–400)
RBC: 4.62 MIL/uL (ref 3.87–5.11)
RDW: 14.1 % (ref 11.5–15.5)
WBC: 17.3 10*3/uL — ABNORMAL HIGH (ref 4.0–10.5)

## 2014-05-29 LAB — BASIC METABOLIC PANEL
ANION GAP: 7 (ref 5–15)
BUN: 21 mg/dL (ref 6–23)
CALCIUM: 9.3 mg/dL (ref 8.4–10.5)
CHLORIDE: 99 meq/L (ref 96–112)
CO2: 31 mmol/L (ref 19–32)
Creatinine, Ser: 0.86 mg/dL (ref 0.50–1.10)
GFR calc Af Amer: 78 mL/min — ABNORMAL LOW (ref >90)
GFR calc non Af Amer: 67 mL/min — ABNORMAL LOW (ref >90)
Glucose, Bld: 149 mg/dL — ABNORMAL HIGH (ref 70–99)
POTASSIUM: 3.3 mmol/L — AB (ref 3.5–5.1)
Sodium: 137 mmol/L (ref 135–145)

## 2014-05-29 MED ORDER — SACCHAROMYCES BOULARDII 250 MG PO CAPS: 250.0000 mg | ORAL_CAPSULE | Freq: Two times a day (BID) | ORAL | Status: DC

## 2014-05-29 MED ORDER — LEVOFLOXACIN 750 MG PO TABS: 750.0000 mg | ORAL_TABLET | Freq: Every day | ORAL | Status: DC

## 2014-05-29 MED ORDER — PREDNISONE 10 MG PO TABS: ORAL_TABLET | ORAL | Status: DC

## 2014-05-29 MED ORDER — GUAIFENESIN ER 600 MG PO TB12: 1200.0000 mg | ORAL_TABLET | Freq: Two times a day (BID) | ORAL | Status: DC

## 2014-05-29 NOTE — Progress Notes (Signed)
Patient educated on discharge instructions. No questions. Pt understands new prescriptions and where to pick them up.

## 2014-05-29 NOTE — Progress Notes (Signed)
Spoke with Victoria [name] with Infection Prevention and she stated that patient could come off C-Diff precautions if she has not had bowel movement in 48 hours. Patient states that she has not had a bowel movement since she has been in the hospital. Removed patient from C-Diff precautions. Will continue to monitor and document stools.

## 2014-05-29 NOTE — Discharge Summary (Signed)
Victoria [name], is a 69 y.o. female  DOB 1945-08-21  MRN [id number].  Admission date:  05/27/2014  Admitting Physician  Berle Mull, MD  Discharge Date:  05/29/2014   Primary MD  Scarlette Calico, MD  Recommendations for primary care physician for things to follow:   Follow resolution of SOB- if persistent, refer to pulmonary ?FOB/CT chest with contrast. If diarrhea consider C.difficile as prolonged course of antibiotics.   Admission Diagnosis  COPD exacerbation [J44.1]   Discharge Diagnosis  COPD exacerbation [J44.1]    Active Problems:   Obesity (BMI 30.0-34.9)   Essential hypertension      Past Medical History  Diagnosis Date  . Asthma     uses advair   . Arthritis     left ankle, right knee, right SI joint, wrists  . Varicella as child  . 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 Hypertension   . Hyperlipidemia   . Tuberculosis 2009    h/o positive PPD- seen at health department, had  normal chest x-ray. she was not treated for a  new conversion.  [name] COPD (chronic obstructive pulmonary disease)   . Murmur, cardiac 1950    Past Surgical History  Procedure Laterality Date  . Excision of pelvic absess, right ovary      2008  . Appendectomy      2008  . Cesarean section      1972       History of present illness and  Hospital Course:     Kindly see H&P for history of present illness and admission details, please review complete Labs, Consult reports and Test reports for all details in brief  HPI  from the history and physical done on the day of admission    [name] is a pleasant 69 y.o. female with Past medical history of COPD, morbid obesity, nocturnal oxygen use, essential  Hypertension, who Presented with shortness of breath, cough and sputum production. Patient had been  treated with doxycycline and prednisone taper as outpatient without significant improvement. She was Admitted to the hospital for further evaluation and was started on  levofloxacin and steroids as well as Supportive management with bronchodilators, mucolytics, antitussives and oxygen as needed. She had CXR which was unrevealing. She also reported diarrhea before admission but it had stopped by the time of admission. She responded well to Levaquin/Prednisone. However, her white count increased(17000 on day of d/c), likely related to steroids as no fever and patient made clinical improvement. Patient eager to go home as she feels better. Will d/c on levaquin/Prednisone/Florastor/Broncvhodilators/O2 with instructions to follow with Dr Chase Caller. She may benefit from further pulmonary evaluation if recurrence of symptoms in view of history of cigarette smoking till 2012(?CT chest with contrast/FOB). If diarrhea would worry for C-diff.  Discharge Condition: Stable.   Follow UP- Dr Daughtry/Dr Chase Caller.[name]    Discharge Instructions  and  Discharge Medications     Discharge Instructions    Diet - low  sodium heart healthy    Complete by:  As directed      Discharge instructions    Complete by:  As directed   If diarrhea, contact your doctor for further tests.     Increase activity slowly    Complete by:  As directed             Medication List    STOP taking these medications        DOANS PILLS 325 MG Tabs  Generic drug:  Magnesium Salicylate     doxycycline 100 MG tablet  Commonly known as:  VIBRA-TABS     ibuprofen 200 MG tablet  Commonly known as:  ADVIL,MOTRIN      TAKE these medications        albuterol 108 (90 BASE) MCG/ACT inhaler  Commonly known as:  VENTOLIN HFA  INHALE ONE PUFF EVERY 6 HOURS AS NEEDED FOR WHEEZING     alendronate 70 MG tablet  Commonly known as:  FOSAMAX  Take 1 tablet (70 mg total) by mouth every 7 (seven) days. Take with a full glass of water on an  empty stomach.     amLODipine 10 MG tablet  Commonly known as:  NORVASC  Take 1 tablet (10 mg total) by mouth daily.     BAYER WOMENS 81-300 MG Tabs  Generic drug:  Aspirin-Calcium Carbonate  Take 1 tablet by mouth daily with breakfast.     beclomethasone 80 MCG/ACT inhaler  Commonly known as:  QVAR  Inhale 2 puffs into the lungs 2 (two) times daily.     CARB INTERCEPT/PHASE 2 Caps  Take 2 capsules by mouth 3 (three) times daily with meals as needed (for carb meals).     Flunisolide HFA 80 MCG/ACT Aers  Commonly known as:  AEROSPAN  Inhale 2 puffs into the lungs 2 (two) times daily.     fluticasone 110 MCG/ACT inhaler  Commonly known as:  FLOVENT HFA  Inhale 2 puffs into the lungs 2 (two) times daily.     guaiFENesin 600 MG 12 hr tablet  Commonly known as:  MUCINEX  Take 2 tablets (1,200 mg total) by mouth 2 (two) times daily.     hydrochlorothiazide 25 MG tablet  Commonly known as:  HYDRODIURIL  Take 1 tablet (25 mg total) by mouth daily.     levofloxacin 750 MG tablet  Commonly known as:  LEVAQUIN  Take 1 tablet (750 mg total) by mouth daily.     lovastatin 20 MG tablet  Commonly known as:  MEVACOR  Take 1 tablet (20 mg total) by mouth daily.     OXYGEN  Inhale 2 L into the lungs at bedtime.     predniSONE 10 MG tablet  Commonly known as:  DELTASONE  Please take 4 tablets daily for 2 days, then 3 tablets daily for 2 days, then 2 tablet daily for 2 days, then 1 tablet daily for 2 days, Total 20 tablets.     saccharomyces boulardii 250 MG capsule  Commonly known as:  FLORASTOR  Take 1 capsule (250 mg total) by mouth 2 (two) times daily.     Tiotropium Bromide-Olodaterol 2.5-2.5 MCG/ACT Aers  Commonly known as:  STIOLTO RESPIMAT  Inhale 2 puffs into the lungs every morning.     Umeclidinium-Vilanterol 62.5-25 MCG/INH Aepb  Commonly known as:  ANORO ELLIPTA  Inhale 1 puff into the lungs daily.     Vitamin D-3 5000 UNITS Tabs  Take 1 tablet by mouth every  morning.          Diet and Activity recommendation: See Discharge Instructions above   Consults obtained - None.   Major procedures and Radiology Reports - PLEASE review detailed and final reports for all details, in brief -    Dg Chest 2 View (if Patient Has Fever And/or Copd)  05/27/2014   CLINICAL DATA:  Shortness of breath, dizziness.  EXAM: CHEST  2 VIEW  COMPARISON:  April 23, 2013.  FINDINGS: The heart size and mediastinal contours are within normal limits. No pneumothorax or pleural effusion is noted. Stable bibasilar densities are noted most consistent with scarring. No acute pulmonary disease is noted. The visualized skeletal structures are unremarkable.  IMPRESSION: Stable bibasilar scarring. No acute cardiopulmonary abnormality seen.   Electronically Signed   By: Sabino Dick M.D.   On: 05/27/2014 14:20    Micro Results    No results found for this or any previous visit (from the past 240 hour(s)).     Today   Subjective:   Felipa Eth today has no headache,no chest abdominal pain,no new weakness tingling or numbness, feels much better wants to go home today.  Objective:   Blood pressure 156/72, pulse 79, temperature 97.9 F (36.6 C), temperature source Oral, resp. rate 20, height 5\' 5"  (1.651 m), weight 102.377 kg (225 lb 11.2 oz), SpO2 92 %.   Intake/Output Summary (Last 24 hours) at 05/29/14 1213 Last data filed at 05/29/14 0900  Gross per 24 hour  Intake    870 ml  Output   1250 ml  Net   -380 ml    Exam Awake Alert, Oriented x 3, No new F.N deficits, Normal affect [place].AT,PERRAL Supple Neck,No JVD, No cervical lymphadenopathy appriciated.  Symmetrical Chest wall movement, Good air movement bilaterally, CTAB RRR,No Gallops,Rubs or new Murmurs, No Parasternal Heave +ve B.Sounds, Abd Soft, Non tender, No organomegaly appriciated, No rebound -guarding or rigidity. No Cyanosis, Clubbing or edema, No new Rash or bruise  Data Review   CBC w Diff:  Lab Results  Component Value Date   WBC 17.3* 05/29/2014   HGB 13.2 05/29/2014   HCT 40.4 05/29/2014   PLT 286 05/29/2014   LYMPHOPCT 31.2 02/11/2014   MONOPCT 7.7 02/11/2014   EOSPCT 2.5 02/11/2014   BASOPCT 0.6 02/11/2014    CMP: Lab Results  Component Value Date   NA 137 05/29/2014   K 3.3* 05/29/2014   CL 99 05/29/2014   CO2 31 05/29/2014   BUN 21 05/29/2014   CREATININE 0.86 05/29/2014   PROT 7.4 05/28/2014   ALBUMIN 3.8 05/28/2014   BILITOT 0.3 05/28/2014   ALKPHOS 94 05/28/2014   AST 21 05/28/2014   ALT 10 05/28/2014  .   Total Time in preparing paper work, data evaluation and todays exam - 25 minutes  Mayar Whittier M.D on 05/29/2014 at 12:13 PM  [facility] Office  [phone]

## 2014-05-29 NOTE — Progress Notes (Signed)
Sent down C-Diff sample to lab early this am. Received a phone call stating that because it was formed they would not run it. Paged MD and Infection prevention to see if patient could come off C-Diff precautions. Will continue to monitor.

## 2014-05-29 NOTE — Progress Notes (Signed)
Pt has been having formed stools. Stool culture sent down for R/O C-Diff. Will let MD no.

## 2014-06-04 ENCOUNTER — Telehealth: Payer: Self-pay | Admitting: Internal Medicine

## 2014-06-04 NOTE — Telephone Encounter (Signed)
Per SN- Cannot handle over the phone. Pt will need OV with TP.   Called and spoke to pt. Informed pt of the recs per SN. Appt made with TP on 06/07/2014. Pt verbalized understanding and denied any further questions or concerns at this time.

## 2014-06-04 NOTE — Telephone Encounter (Signed)
Informed pt that once SN replies we will call her back with recs. Pt verbalized understanding.

## 2014-06-04 NOTE — Telephone Encounter (Signed)
Called and spoke to pt. Pt was recently d/c from hospital with pred taper, pt took the pred incorrectly. Pt tapered the pred down by only 1 day- pred taper was suppose to be taken for: 4 tabs x 2 days, then 3 tabs x 2 days, then 2 tabs x 2 days, then 1 tab for 2 day. Pt c/o chest tightness, wheezing, slight increase in SOB from baseline. Pt stated overall she feels much improved since d/c but is worried what she needs to do about the pred. MR is unavailable today. Will send to doc of day.   Dr. Lenna Gilford please advise.   Allergies  Allergen Reactions  . Nickel Rash    Severe rash to infection: pt is allergic to all metals other than sterling silver or gold jewelry.   [name] [Olmesartan] Swelling    Swelling of face and arms   . [name] [Valsartan] Swelling    Swelling of face and arms   . Lisinopril Cough  . Codeine Other (See Comments)    jittery  . Monosodium Glutamate Other (See Comments)    Facial swelling per pt    Current Outpatient Prescriptions on File Prior to Visit  Medication Sig Dispense Refill  . albuterol (VENTOLIN HFA) 108 (90 BASE) MCG/ACT inhaler INHALE ONE PUFF EVERY 6 HOURS AS NEEDED FOR WHEEZING (Patient taking differently: Inhale 1 puff into the lungs every 6 (six) hours as needed for wheezing. ) 18 each 5  . alendronate (FOSAMAX) 70 MG tablet Take 1 tablet (70 mg total) by mouth every 7 (seven) days. Take with a full glass of water on an empty stomach. (Patient taking differently: Take 70 mg by mouth every 7 (seven) days. Take with a full glass of water on an empty stomach. Takes on Sunday's) 4 tablet 11  . amLODipine (NORVASC) 10 MG tablet Take 1 tablet (10 mg total) by mouth daily. 30 tablet 6  . Aspirin-Calcium Carbonate (BAYER WOMENS) 81-300 MG TABS Take 1 tablet by mouth daily with breakfast.    . beclomethasone (QVAR) 80 MCG/ACT inhaler Inhale 2 puffs into the lungs 2 (two) times daily. (Patient taking differently: Inhale 2 puffs into the lungs 2 (two) times daily  as needed (for wheezing). ) 1 Inhaler 0  . Cholecalciferol (VITAMIN D-3) 5000 UNITS TABS Take 1 tablet by mouth every morning.    . Flunisolide HFA (AEROSPAN) 80 MCG/ACT AERS Inhale 2 puffs into the lungs 2 (two) times daily. (Patient not taking: Reported on 05/27/2014) 1 Inhaler 0  . fluticasone (FLOVENT HFA) 110 MCG/ACT inhaler Inhale 2 puffs into the lungs 2 (two) times daily.    [name] guaiFENesin (MUCINEX) 600 MG 12 hr tablet Take 2 tablets (1,200 mg total) by mouth 2 (two) times daily. 8 tablet 0  . hydrochlorothiazide (HYDRODIURIL) 25 MG tablet Take 1 tablet (25 mg total) by mouth daily. 90 tablet 1  . levofloxacin (LEVAQUIN) 750 MG tablet Take 1 tablet (750 mg total) by mouth daily. 4 tablet 0  . lovastatin (MEVACOR) 20 MG tablet Take 1 tablet (20 mg total) by mouth daily. 30 tablet 6  . Nutritional Supp - Diet Aids (CARB INTERCEPT/PHASE 2) CAPS Take 2 capsules by mouth 3 (three) times daily with meals as needed (for carb meals).    . OXYGEN Inhale 2 L into the lungs at bedtime.    . predniSONE (DELTASONE) 10 MG tablet Please take 4 tablets daily for 2 days, then 3 tablets daily for 2 days, then 2 tablet daily for  2 days, then 1 tablet daily for 2 days, Total 20 tablets. 20 tablet 0  . saccharomyces boulardii (FLORASTOR) 250 MG capsule Take 1 capsule (250 mg total) by mouth 2 (two) times daily. 10 capsule 0  . Tiotropium Bromide-Olodaterol (STIOLTO RESPIMAT) 2.5-2.5 MCG/ACT AERS Inhale 2 puffs into the lungs every morning. (Patient not taking: Reported on 05/27/2014) 1 Inhaler 0  . Umeclidinium-Vilanterol (ANORO ELLIPTA) 62.5-25 MCG/INH AEPB Inhale 1 puff into the lungs daily. (Patient taking differently: Inhale 1 puff into the lungs daily with breakfast. ) 60 each 0   No current facility-administered medications on file prior to visit.

## 2014-06-04 NOTE — Telephone Encounter (Signed)
Pt realized that she didn't take her prednisone correctly should she take the rest [phone]

## 2014-06-07 ENCOUNTER — Ambulatory Visit: Payer: Commercial Managed Care - HMO | Admitting: Adult Health

## 2014-06-11 ENCOUNTER — Ambulatory Visit (INDEPENDENT_AMBULATORY_CARE_PROVIDER_SITE_OTHER): Payer: Medicare HMO | Admitting: Adult Health

## 2014-06-11 VITALS — BP 132/66 | HR 74 | Ht 65.5 in | Wt 225.2 lb

## 2014-06-11 DIAGNOSIS — J441 Chronic obstructive pulmonary disease with (acute) exacerbation: Secondary | ICD-10-CM

## 2014-06-11 NOTE — Patient Instructions (Addendum)
Continue on QVAR 2 puffs Twice daily  , rinse after use.  Continue on ANORO 1 puff daily  Begin Zyrtec 10mg  At bedtime   Hold Fosamax for 2 months then can restart.  Mucinex Twice daily  As needed  Congestion.  Delsym 2 tsp Twice daily  As needed  Cough.  Need to establish with Dentist.  Begin Zantac 150mg  Twice daily  .  Follow up Dr. Chase Caller in 6 weeks and As needed   Please contact office for sooner follow up if symptoms do not improve or worsen or seek emergency care

## 2014-06-11 NOTE — Assessment & Plan Note (Signed)
Resolving exacerbation  Suspect UAC with triggers of AR/GERD  Will hold fosamax as may aggravate GERD  Need dental care   Plan  continue on QVAR 2 puffs Twice daily  , rinse after use.  Continue on ANORO 1 puff daily  Begin Zyrtec 10mg  At bedtime   Hold Fosamax for 2 months then can restart.  Mucinex Twice daily  As needed  Congestion.  Delsym 2 tsp Twice daily  As needed  Cough.  Need to establish with Dentist.  Begin Zantac 150mg  Twice daily  .  Follow up Dr. Chase Caller in 6 weeks and As needed   Please contact office for sooner follow up if symptoms do not improve or worsen or seek emergency care

## 2014-06-11 NOTE — Progress Notes (Signed)
   Subjective:    Patient ID: Victoria [name], female    DOB: [birth year], 69 y.o.   MRN: [id number]  HPI  #COPD/Asthma  - 50 pack smoker, quit Jan 2012 -   CXR 04/18/11  -copd, lingular atelectasis - PFT 06/19/11 (done on advair):  - fev1 0.97L/43%, Ratio 43, TLC 5L/104%, DLCO 12.3/51%, No BD response - CAT score 7 - May 2012 - Rx spiriva and advair - Alpha 1 11/23/2011:     06/11/2014  [facility] follow up  Patient returns for post hospital follow-up. Patient was admitted January 11 to January 13 for COPD exacerbation She was treated with IV antibiotics, steroids, nebulized bronchodilators. Discharged on Levaquin and a prednisone taper Since discharge. Patient reports she is doing some better but still has lingering cough and congestion. She has some postnasal drip and drainage. She denies any hemoptysis, fever, orthopnea, PND or leg swelling. Complains that her cough is daily and is aggravating. Currently not using anything for cough. She has finished her antibiotics and steroids.  l    Review of Systems  Constitutional: Negative for fever and unexpected weight change.  HENT: Positive for postnasal drip Negative for congestion, dental problem, ear pain, nosebleeds, rhinorrhea, sneezing, sore throat and trouble swallowing.   Eyes: Negative for redness and itching.  Respiratory: Positive for shortness of breath. Negative for cough and wheezing.   Cardiovascular: Negative for palpitations and leg swelling.  Gastrointestinal: Negative for nausea and vomiting.  Genitourinary: Negative for dysuria.  Musculoskeletal: Negative for joint swelling.  Skin: Negative for rash.  Neurological: Negative for headaches.  Hematological: Does not bruise/bleed easily.  Psychiatric/Behavioral: Negative for dysphoric mood. The patient is not nervous/anxious.      Objective:   Physical Exam  GEN: A/Ox3; pleasant , NAD, well nourished   HEENT:  [place]/AT,  EACs-clear, TMs-wnl, NOSE-clear,  THROAT-clear, no lesions, no postnasal drip or exudate noted , poor dentition   NECK:  Supple w/ fair ROM; no JVD; normal carotid impulses w/o bruits; no thyromegaly or nodules palpated; no lymphadenopathy.  RESP  Decreased BS in bases .no accessory muscle use, no dullness to percussion, upper airway. Pseudo-wheezing on expiration  CARD:  RRR, no m/r/g  , no peripheral edema, pulses intact, no cyanosis or clubbing.  GI:   Soft & nt; nml bowel sounds; no organomegaly or masses detected.  Musco: Warm bil, no deformities or joint swelling noted.   Neuro: alert, no focal deficits noted.    Skin: Warm, no lesions or rashes          Assessment & Plan:

## 2014-06-12 ENCOUNTER — Telehealth: Payer: Self-pay | Admitting: Adult Health

## 2014-06-12 MED ORDER — RANITIDINE HCL 150 MG PO TABS: 150.0000 mg | ORAL_TABLET | Freq: Two times a day (BID) | ORAL | Status: DC

## 2014-06-12 MED ORDER — BECLOMETHASONE DIPROPIONATE 80 MCG/ACT IN AERS: 2.0000 | INHALATION_SPRAY | Freq: Two times a day (BID) | RESPIRATORY_TRACT | Status: DC

## 2014-06-12 MED ORDER — CETIRIZINE HCL 10 MG PO TABS: 10.0000 mg | ORAL_TABLET | Freq: Every day | ORAL | Status: DC

## 2014-06-12 NOTE — Telephone Encounter (Signed)
Rx has been sent in. Pt is aware.  She also wanted prescriptions to be sent in for Zyrtec and Zantac (TP advised). This has been done as well.

## 2014-06-19 ENCOUNTER — Telehealth: Payer: Self-pay | Admitting: Internal Medicine

## 2014-06-19 NOTE — Telephone Encounter (Signed)
Pt calling requesting more information about most PFT (2013) and CT (2015) - pt was made aware that she has some lung nodules that are benign. Pt is wanting to know if these need to be removed. Pt is concerned of the possibility of these becoming malignant and doesn't want to take that risk. Please advise Dr Chase Caller. Pt has follow up 07/16/2014 with MR Thanks.

## 2014-06-21 NOTE — Telephone Encounter (Signed)
lmtcb X1 for pt  

## 2014-06-21 NOTE — Telephone Encounter (Signed)
Wil ldiscuss  07/16/14 visit. PFT  2013 - copd. CT 2015 - small lung nodules since 2008 without change. They are not cancer and never will become cancer and too small to be removed. Will discuss more in 07/16/14 visit

## 2014-06-24 NOTE — Telephone Encounter (Signed)
Pt is aware of MR's response. Nothing further was needed at this time.

## 2014-07-01 ENCOUNTER — Encounter: Payer: Commercial Managed Care - HMO | Admitting: Adult Health

## 2014-07-16 ENCOUNTER — Encounter: Payer: Self-pay | Admitting: Internal Medicine

## 2014-07-16 ENCOUNTER — Ambulatory Visit (INDEPENDENT_AMBULATORY_CARE_PROVIDER_SITE_OTHER): Payer: Medicare HMO | Admitting: Internal Medicine

## 2014-07-16 VITALS — BP 148/72 | HR 92 | Ht 65.0 in | Wt 228.0 lb

## 2014-07-16 DIAGNOSIS — J449 Chronic obstructive pulmonary disease, unspecified: Secondary | ICD-10-CM

## 2014-07-16 NOTE — Progress Notes (Signed)
Subjective:    Patient ID: Victoria [name], female    DOB: 10/14/1945, 69 y.o.   MRN: [id number]  HPI    #COPD  - 50 pack smoker, quit Jan 2012 -   CXR 04/18/11  -copd, lingular atelectasis - PFT 06/19/11 (done on advair):  - fev1 0.97L/43%, Ratio 43, TLC 5L/104%, DLCO 12.3/51%, No BD response - CAT score 7 - May 2012 - Rx spiriva and advair - Alpha 1 11/23/2011:  l      06/11/2014  [facility] follow up  Patient returns for post hospital follow-up. Patient was admitted January 11 to January 13 for COPD exacerbation She was treated with IV antibiotics, steroids, nebulized bronchodilators. Discharged on Levaquin and a prednisone taper Since discharge. Patient reports she is doing some better but still has lingering cough and congestion. She has some postnasal drip and drainage. She denies any hemoptysis, fever, orthopnea, PND or leg swelling. Complains that her cough is daily and is aggravating. Currently not using anything for cough. She has finished her antibiotics and steroids.     OV 07/16/2014  Chief Complaint  Patient presents with  . Follow-up    Pt was in hospital in 05/2014 for COPD exacerbation. Pt saw TP after d/c. Pt c/o DOE, mild dry cough. Pt denies CP/tightness.     Copd: well. No new issues. Symptoms are stable. Had recent AECOPD but has recovered   CT 2015 - small lung nodules since 2008 without change   Obest: trying daniel fast method to  Lose weight invokes spirituality   Current outpatient prescriptions:  .  albuterol (VENTOLIN HFA) 108 (90 BASE) MCG/ACT inhaler, INHALE ONE PUFF EVERY 6 HOURS AS NEEDED FOR WHEEZING (Patient taking differently: Inhale 1 puff into the lungs every 6 (six) hours as needed for wheezing. ), Disp: 18 each, Rfl: 5 .  amLODipine (NORVASC) 10 MG tablet, Take 1 tablet (10 mg total) by mouth daily., Disp: 30 tablet, Rfl: 6 .  Aspirin-Calcium Carbonate (BAYER WOMENS) 81-300 MG TABS, Take 1 tablet by mouth daily with breakfast.,  Disp: , Rfl:  .  beclomethasone (QVAR) 80 MCG/ACT inhaler, Inhale 2 puffs into the lungs 2 (two) times daily., Disp: 1 Inhaler, Rfl: 5 .  cetirizine (ZYRTEC ALLERGY) 10 MG tablet, Take 1 tablet (10 mg total) by mouth daily. (Patient taking differently: Take 10 mg by mouth daily as needed. ), Disp: 30 tablet, Rfl: 5 .  Cholecalciferol (VITAMIN D-3) 5000 UNITS TABS, Take 1 tablet by mouth once a week. , Disp: , Rfl:  .  guaiFENesin (MUCINEX) 600 MG 12 hr tablet, Take 2 tablets (1,200 mg total) by mouth 2 (two) times daily. (Patient taking differently: Take 1,200 mg by mouth 2 (two) times daily as needed. ), Disp: 8 tablet, Rfl: 0 .  hydrochlorothiazide (HYDRODIURIL) 25 MG tablet, Take 1 tablet (25 mg total) by mouth daily., Disp: 90 tablet, Rfl: 1 .  lovastatin (MEVACOR) 20 MG tablet, Take 1 tablet (20 mg total) by mouth daily., Disp: 30 tablet, Rfl: 6 .  Nutritional Supp - Diet Aids (CARB INTERCEPT/PHASE 2) CAPS, Take 2 capsules by mouth 3 (three) times daily with meals as needed (for carb meals)., Disp: , Rfl:  .  OXYGEN, Inhale 2 L into the lungs at bedtime., Disp: , Rfl:  .  ranitidine (ZANTAC) 150 MG tablet, Take 1 tablet (150 mg total) by mouth 2 (two) times daily., Disp: 60 tablet, Rfl: 5 .  Umeclidinium-Vilanterol (ANORO ELLIPTA) 62.5-25 MCG/INH AEPB, Inhale 1 puff  into the lungs daily. (Patient taking differently: Inhale 1 puff into the lungs daily with breakfast. ), Disp: 60 each, Rfl: 0 .  alendronate (FOSAMAX) 70 MG tablet, Take 1 tablet (70 mg total) by mouth every 7 (seven) days. Take with a full glass of water on an empty stomach. (Patient not taking: Reported on 07/16/2014), Disp: 4 tablet, Rfl: 11    Review of Systems  Constitutional: Negative for fever and unexpected weight change.  HENT: Negative for congestion, dental problem, ear pain, nosebleeds, postnasal drip, rhinorrhea, sinus pressure, sneezing, sore throat and trouble swallowing.   Eyes: Negative for redness and itching.    Respiratory: Positive for cough and shortness of breath. Negative for chest tightness and wheezing.   Cardiovascular: Negative for palpitations and leg swelling.  Gastrointestinal: Negative for nausea and vomiting.  Genitourinary: Negative for dysuria.  Musculoskeletal: Negative for joint swelling.  Skin: Negative for rash.  Neurological: Negative for headaches.  Hematological: Does not bruise/bleed easily.  Psychiatric/Behavioral: Negative for dysphoric mood. The patient is not nervous/anxious.        Objective:   Physical Exam  Filed Vitals:   07/16/14 1417  BP: 148/72  Pulse: 92  Height: 5\' 5"  (1.651 m)  Weight: 228 lb (103.42 kg)  SpO2: 92%  ;l Obese femal Looks well Lung : CTA bilaterally . No wheeze Neuro: AXOX3. Speech normal CVS: Normal  Heart sounds Ext: No edema Skin: visible skin appears intactt     Assessment & Plan:     ICD-9-CM ICD-10-CM   1. Chronic obstructive pulmonary disease, unspecified COPD, unspecified chronic bronchitis type 496 J44.9    #COPD is currently stable disease Follow med calendar closely and bring to each visit;  Continue on ANORO 1 puff daily  And QVAR 2 puff twice daily Continue nocturnal o2 Will refer you to Claudia Desanctis for [place] IMPACT COPD Trial - she will call you  #FOllowup  3 months with me for cOPD Await call from Claudia Desanctis for COPD research study     Dr. Brand Males, M.D., [facility].C.P Pulmonary and Critical Care Medicine Staff Physician [place] [facility] Pager: [phone], If no answer or between  15:00h - 7:00h: call [phone]  07/20/2014 10:22 PM

## 2014-07-16 NOTE — Patient Instructions (Addendum)
#  COPD is currently stable disease Follow med calendar closely and bring to each visit;  Continue on ANORO 1 puff daily  And QVAR 2 puff twice daily Continue nocturnal o2 Will refer you to Claudia Desanctis for [place] IMPACT COPD Trial - she will call you  #FOllowup  3 months with me for cOPD Await call from Claudia Desanctis for COPD research study

## 2014-08-12 ENCOUNTER — Telehealth: Payer: Self-pay | Admitting: Internal Medicine

## 2014-08-12 MED ORDER — PREDNISONE 10 MG PO TABS: ORAL_TABLET | ORAL | Status: DC

## 2014-08-12 MED ORDER — AZITHROMYCIN 250 MG PO TABS: ORAL_TABLET | ORAL | Status: DC

## 2014-08-12 NOTE — Telephone Encounter (Signed)
She has aecopd Allergies  Allergen Reactions  . Nickel Rash    Severe rash to infection: pt is allergic to all metals other than sterling silver or gold jewelry.   [name] [Olmesartan] Swelling    Swelling of face and arms   . [name] [Valsartan] Swelling    Swelling of face and arms   . Lisinopril Cough  . Codeine Other (See Comments)    jittery  . Monosodium Glutamate Other (See Comments)    Facial swelling per pt   She needs to   1, Please take prednisone 40 mg x1 day, then 30 mg x1 day, then 20 mg x1 day, then 10 mg x1 day, and then 5 mg x1 day and stop  2. Zpak  3. Did she get in touch with Claudia Desanctis about participating in AECOPD research trial?  Thanks Dr. Brand Males, M.D., [facility].C.P Pulmonary and Critical Care Medicine Staff Physician [place] [facility] Pager: [phone], If no answer or between  15:00h - 7:00h: call [phone]  08/12/2014 9:58 AM

## 2014-08-12 NOTE — Telephone Encounter (Signed)
Thanks and noted 

## 2014-08-12 NOTE — Telephone Encounter (Signed)
Called and spoke to pt. Informed pt of the recs per MR. Both prescriptions sent to preferred pharmacy. Pt verbalized understanding and denied any further questions or concerns at this time.   Pt stated she has an appt with Claudia Desanctis on 08/14/14 at 0900.

## 2014-08-12 NOTE — Telephone Encounter (Signed)
Called and spoke to pt. Pt c/o increase in SOB, prod cough with yellow and white mucus, chest congestion, sinus congestion, chest tightness, chills but unsure if febrile-pt has not taken temp X 4 days. Pt stated she was very active this past weekend with church and now feels run down. Pt stated she has been taking sudafed.   MR please advise.   Allergies  Allergen Reactions  . Nickel Rash    Severe rash to infection: pt is allergic to all metals other than sterling silver or gold jewelry.   [name] [Olmesartan] Swelling    Swelling of face and arms   . [name] [Valsartan] Swelling    Swelling of face and arms   . Lisinopril Cough  . Codeine Other (See Comments)    jittery  . Monosodium Glutamate Other (See Comments)    Facial swelling per pt

## 2014-08-13 ENCOUNTER — Other Ambulatory Visit: Payer: Self-pay | Admitting: Internal Medicine

## 2014-08-13 DIAGNOSIS — R0600 Dyspnea, unspecified: Secondary | ICD-10-CM

## 2014-08-16 ENCOUNTER — Telehealth: Payer: Self-pay | Admitting: Internal Medicine

## 2014-08-16 NOTE — Telephone Encounter (Signed)
Spoke with pt. ROV has been scheduled for 08/20/14 at 11:15am per her request.

## 2014-08-20 ENCOUNTER — Encounter: Payer: Self-pay | Admitting: Internal Medicine

## 2014-08-20 ENCOUNTER — Ambulatory Visit (INDEPENDENT_AMBULATORY_CARE_PROVIDER_SITE_OTHER): Payer: Medicare HMO | Admitting: Internal Medicine

## 2014-08-20 VITALS — BP 144/72 | HR 76 | Ht 65.0 in | Wt 216.0 lb

## 2014-08-20 DIAGNOSIS — J449 Chronic obstructive pulmonary disease, unspecified: Secondary | ICD-10-CM | POA: Diagnosis not present

## 2014-08-20 NOTE — Patient Instructions (Addendum)
#  COPD is currently stable disease Glad you have nearly recovered from recent exacerbation Follow med calendar closely and bring to each visit;  Continue on ANORO 1 puff daily  And QVAR 2 puff twice daily Continue nocturnal o2 Take letter that is ok to return to work REfer to pulmonary rehab for maintenance phase  #FOllowup  3 months with me for cOPD Await call from Claudia Desanctis for COPD research study

## 2014-08-20 NOTE — Progress Notes (Signed)
Subjective:    Patient ID: Victoria [name], female    DOB: [birth year], 69 y.o.   MRN: [id number]  HPI  OV 08/20/2014  Chief Complaint  Patient presents with  . Acute Visit    Pt stated shes feeling better but s/s have not resolved. Pt c/o prod cough with white with little yellow mucus production. Pt states her SOB has improved and c/o chest tightness.    Recent exacerbation. Currently feeling well. Has finished antibiotic and prednisone 3 days ago. She wants to return to work part-time. She now has attempted in pulmonary rehabilitation. This is because she still has some dyspnea on exertion at baseline of note for her obesity she has lost 12 pounds of weight - daniel fast method   Review of Systems  Constitutional: Negative for fever and unexpected weight change.  HENT: Negative for congestion, dental problem, ear pain, nosebleeds, postnasal drip, rhinorrhea, sinus pressure, sneezing, sore throat and trouble swallowing.   Eyes: Negative for redness and itching.  Respiratory: Positive for cough, chest tightness and shortness of breath. Negative for wheezing.   Cardiovascular: Negative for palpitations and leg swelling.  Gastrointestinal: Negative for nausea and vomiting.  Genitourinary: Negative for dysuria.  Musculoskeletal: Negative for joint swelling.  Skin: Negative for rash.  Neurological: Negative for headaches.  Hematological: Does not bruise/bleed easily.  Psychiatric/Behavioral: Negative for dysphoric mood. The patient is not nervous/anxious.        Objective:   Physical Exam  Constitutional: She is oriented to person, place, and time. She appears well-developed and well-nourished. No distress.  HENT:  Head: Normocephalic and atraumatic.  Right Ear: External ear normal.  Left Ear: External ear normal.  Mouth/Throat: Oropharynx is clear and moist. No oropharyngeal exudate.  Eyes: Conjunctivae and EOM are normal. Pupils are equal, round, and reactive to light. Right eye  exhibits no discharge. Left eye exhibits no discharge. No scleral icterus.  Neck: Normal range of motion. Neck supple. No JVD present. No tracheal deviation present. No thyromegaly present.  Cardiovascular: Normal rate, regular rhythm, normal heart sounds and intact distal pulses.  Exam reveals no gallop and no friction rub.   No murmur heard. Pulmonary/Chest: Effort normal and breath sounds normal. No respiratory distress. She has no wheezes. She has no rales. She exhibits no tenderness.  Abdominal: Soft. Bowel sounds are normal. She exhibits no distension and no mass. There is no tenderness. There is no rebound and no guarding.  Musculoskeletal: Normal range of motion. She exhibits no edema or tenderness.  Lymphadenopathy:    She has no cervical adenopathy.  Neurological: She is alert and oriented to person, place, and time. She has normal reflexes. No cranial nerve deficit. She exhibits normal muscle tone. Coordination normal.  Skin: Skin is warm and dry. No rash noted. She is not diaphoretic. No erythema. No pallor.  Psychiatric: She has a normal mood and affect. Her behavior is normal. Judgment and thought content normal.  Vitals reviewed.   Filed Vitals:   08/20/14 1148  BP: 144/72  Pulse: 76  Height: 5\' 5"  (1.651 m)  Weight: 216 lb (97.977 kg)  SpO2: 96%         Assessment & Plan:     ICD-9-CM ICD-10-CM   1. Chronic obstructive pulmonary disease, unspecified COPD, unspecified chronic bronchitis type 496 J44.9 AMB referral to rehabilitation   COPD is currently stable disease Glad you have nearly recovered from recent exacerbation Follow med calendar closely and bring to each visit;  Continue on  ANORO 1 puff daily  And QVAR 2 puff twice daily Continue nocturnal o2 Take letter that is ok to return to work REfer to pulmonary rehab for maintenance phase  #FOllowup  3 months with me for cOPD Await call from Claudia Desanctis for [place] stud

## 2014-09-04 ENCOUNTER — Telehealth: Payer: Self-pay | Admitting: Internal Medicine

## 2014-09-04 DIAGNOSIS — I10 Essential (primary) hypertension: Secondary | ICD-10-CM

## 2014-09-04 MED ORDER — HYDROCHLOROTHIAZIDE 25 MG PO TABS: 25.0000 mg | ORAL_TABLET | Freq: Every day | ORAL | Status: DC

## 2014-09-04 NOTE — Telephone Encounter (Signed)
Patient needs refill for hydrochlorothiazide (HYDRODIURIL) 25 MG tablet [[id number]]. I made an appt for her to come in tomorrow afternoon. She has only one pill left and was hoping to go ahead and send in the refill. Pharmacy is [facility] [address]

## 2014-09-04 NOTE — Telephone Encounter (Signed)
Approved and sent e-script to pharmacy requested by patient.

## 2014-09-05 ENCOUNTER — Other Ambulatory Visit (INDEPENDENT_AMBULATORY_CARE_PROVIDER_SITE_OTHER): Payer: Medicare HMO

## 2014-09-05 ENCOUNTER — Encounter: Payer: Self-pay | Admitting: Internal Medicine

## 2014-09-05 ENCOUNTER — Ambulatory Visit (INDEPENDENT_AMBULATORY_CARE_PROVIDER_SITE_OTHER): Payer: Medicare HMO | Admitting: Internal Medicine

## 2014-09-05 VITALS — BP 144/60 | HR 83 | Temp 98.3°F | Resp 16 | Ht 65.0 in | Wt 223.0 lb

## 2014-09-05 DIAGNOSIS — I10 Essential (primary) hypertension: Secondary | ICD-10-CM | POA: Diagnosis not present

## 2014-09-05 DIAGNOSIS — E876 Hypokalemia: Secondary | ICD-10-CM | POA: Diagnosis not present

## 2014-09-05 DIAGNOSIS — E785 Hyperlipidemia, unspecified: Secondary | ICD-10-CM | POA: Diagnosis not present

## 2014-09-05 DIAGNOSIS — R0602 Shortness of breath: Secondary | ICD-10-CM

## 2014-09-05 DIAGNOSIS — E8809 Other disorders of plasma-protein metabolism, not elsewhere classified: Secondary | ICD-10-CM

## 2014-09-05 LAB — BASIC METABOLIC PANEL
BUN: 19 mg/dL (ref 6–23)
CO2: 34 mEq/L — ABNORMAL HIGH (ref 19–32)
Calcium: 9.9 mg/dL (ref 8.4–10.5)
Chloride: 101 mEq/L (ref 96–112)
Creatinine, Ser: 1.07 mg/dL (ref 0.40–1.20)
GFR: 65.09 mL/min (ref >60.00)
Glucose, Bld: 95 mg/dL (ref 70–99)
POTASSIUM: 3.6 meq/L (ref 3.5–5.1)
SODIUM: 139 meq/L (ref 135–145)

## 2014-09-05 LAB — LDL CHOLESTEROL, DIRECT: Direct LDL: 121 mg/dL

## 2014-09-05 LAB — LIPID PANEL
CHOL/HDL RATIO: 4
CHOLESTEROL: 199 mg/dL (ref 0–200)
HDL: 49.5 mg/dL (ref >39.00)
NonHDL: 149.5
Triglycerides: 210 mg/dL — ABNORMAL HIGH (ref 0.0–149.0)
VLDL: 42 mg/dL — ABNORMAL HIGH (ref 0.0–40.0)

## 2014-09-05 LAB — CBC WITH DIFFERENTIAL/PLATELET
Basophils Absolute: 0 10*3/uL (ref 0.0–0.1)
Basophils Relative: 0.4 % (ref 0.0–3.0)
Eosinophils Absolute: 0.3 10*3/uL (ref 0.0–0.7)
Eosinophils Relative: 2.5 % (ref 0.0–5.0)
HEMATOCRIT: 39.1 % (ref 36.0–46.0)
Hemoglobin: 13.2 g/dL (ref 12.0–15.0)
Lymphocytes Relative: 20.8 % (ref 12.0–46.0)
Lymphs Abs: 2.1 10*3/uL (ref 0.7–4.0)
MCHC: 33.9 g/dL (ref 30.0–36.0)
MCV: 84.4 fl (ref 78.0–100.0)
MONO ABS: 0.8 10*3/uL (ref 0.1–1.0)
Monocytes Relative: 7.4 % (ref 3.0–12.0)
Neutro Abs: 7 10*3/uL (ref 1.4–7.7)
Neutrophils Relative %: 68.9 % (ref 43.0–77.0)
Platelets: 269 10*3/uL (ref 150.0–400.0)
RBC: 4.63 Mil/uL (ref 3.87–5.11)
RDW: 14.9 % (ref 11.5–15.5)
WBC: 10.1 10*3/uL (ref 4.0–10.5)

## 2014-09-05 LAB — MAGNESIUM: MAGNESIUM: 1.9 mg/dL (ref 1.5–2.5)

## 2014-09-05 NOTE — Progress Notes (Signed)
Pre visit review using our clinic review tool, if applicable. No additional management support is needed unless otherwise documented below in the visit note. 

## 2014-09-05 NOTE — Progress Notes (Signed)
   Subjective:    Patient ID: Victoria [name], female    DOB: [birth year], 69 y.o.   MRN: [id number]  Hypertension This is a chronic problem. The current episode started more than 1 year ago. The problem is unchanged. The problem is controlled. Associated symptoms include shortness of breath. Pertinent negatives include no anxiety, blurred vision, chest pain, headaches, malaise/fatigue, neck pain, orthopnea, palpitations, peripheral edema, PND or sweats. Past treatments include diuretics and calcium channel blockers. The current treatment provides significant improvement. Compliance problems include diet and exercise.       Review of Systems  Constitutional: Negative.  Negative for fever, chills, malaise/fatigue, diaphoresis, appetite change and fatigue.  HENT: Negative.   Eyes: Negative.  Negative for blurred vision.  Respiratory: Positive for shortness of breath. Negative for cough, choking, chest tightness, wheezing and stridor.   Cardiovascular: Negative.  Negative for chest pain, palpitations, orthopnea, leg swelling and PND.  Gastrointestinal: Negative.  Negative for nausea, vomiting, abdominal pain, diarrhea, constipation and blood in stool.  Endocrine: Negative.   Genitourinary: Negative.   Musculoskeletal: Negative.  Negative for myalgias, back pain, arthralgias and neck pain.  Skin: Negative.  Negative for rash.  Allergic/Immunologic: Negative.   Neurological: Negative.  Negative for dizziness, tremors, weakness and headaches.  Hematological: Negative.  Negative for adenopathy. Does not bruise/bleed easily.  Psychiatric/Behavioral: Negative.        Objective:   Physical Exam  Constitutional: She is oriented to person, place, and time. She appears well-developed and well-nourished. No distress.  HENT:  Head: Normocephalic and atraumatic.  Mouth/Throat: Oropharynx is clear and moist. No oropharyngeal exudate.  Eyes: Conjunctivae are normal. Right eye exhibits no discharge. Left  eye exhibits no discharge. No scleral icterus.  Neck: Normal range of motion. Neck supple. No JVD present. No tracheal deviation present. No thyromegaly present.  Cardiovascular: Normal rate, regular rhythm, normal heart sounds and intact distal pulses.  Exam reveals no gallop and no friction rub.   No murmur heard. Pulmonary/Chest: Effort normal and breath sounds normal. No stridor. No respiratory distress. She has no wheezes. She has no rales. She exhibits no tenderness.  Abdominal: Soft. Bowel sounds are normal. She exhibits no distension and no mass. There is no tenderness. There is no rebound and no guarding.  Musculoskeletal: Normal range of motion. She exhibits no edema or tenderness.  Lymphadenopathy:    She has no cervical adenopathy.  Neurological: She is oriented to person, place, and time.  Skin: Skin is warm and dry. No rash noted. She is not diaphoretic. No erythema. No pallor.  Vitals reviewed.    Lab Results  Component Value Date   WBC 17.3* 05/29/2014   HGB 13.2 05/29/2014   HCT 40.4 05/29/2014   PLT 286 05/29/2014   GLUCOSE 149* 05/29/2014   CHOL 193 02/11/2014   TRIG 429.0* 02/11/2014   HDL 48.10 02/11/2014   LDLDIRECT 103.7 02/11/2014   LDLCALC 107* 07/06/2012   ALT 10 05/28/2014   AST 21 05/28/2014   NA 137 05/29/2014   K 3.3* 05/29/2014   CL 99 05/29/2014   CREATININE 0.86 05/29/2014   BUN 21 05/29/2014   CO2 31 05/29/2014   TSH 0.69 02/11/2014   INR 1.01 05/28/2014       Assessment & Plan:

## 2014-09-05 NOTE — Patient Instructions (Signed)

## 2014-09-07 ENCOUNTER — Encounter: Payer: Self-pay | Admitting: Internal Medicine

## 2014-09-07 NOTE — Assessment & Plan Note (Signed)
Her K+ level is low normal Will cont to follow since she is taking HCTZ

## 2014-09-07 NOTE — Assessment & Plan Note (Signed)
Her BP is well controlled Lytes and renal function are stable 

## 2014-09-07 NOTE — Assessment & Plan Note (Signed)
She has achieved her LDL goal and is doing well on the statin

## 2014-09-11 ENCOUNTER — Telehealth: Payer: Self-pay | Admitting: Internal Medicine

## 2014-09-11 NOTE — Telephone Encounter (Signed)
Pt called in and wanted a nurse to call her back about her lab results on 4/21    Best number [phone]

## 2014-09-11 NOTE — Telephone Encounter (Signed)
Called pt wanting to get results on labs. Inform pt md has sent lab letter out should be receiving soon, but did give md response on letter...Victoria [name]

## 2014-09-19 ENCOUNTER — Telehealth (HOSPITAL_COMMUNITY): Payer: Self-pay

## 2014-09-23 ENCOUNTER — Telehealth (HOSPITAL_COMMUNITY): Payer: Self-pay

## 2014-09-23 NOTE — Telephone Encounter (Signed)
I have called and left a message with Magally to inquire about participation in Pulmonary Rehab per Dr. Golden Pop referral. Will send letter in mail and follow up.

## 2014-09-24 ENCOUNTER — Telehealth (HOSPITAL_COMMUNITY): Payer: Self-pay

## 2014-09-24 NOTE — Telephone Encounter (Signed)
Called patient regarding entrance to Pulmonary Rehab.  Patient states that they are interested in attending the program.  Tanecia is going to verify insurance coverage and follow up.

## 2014-09-30 ENCOUNTER — Telehealth (HOSPITAL_COMMUNITY): Payer: Self-pay

## 2014-09-30 NOTE — Telephone Encounter (Signed)
I have called and left a message with Chelby to inquire about participation in Pulmonary Rehab per Dr. Chase Caller referral. Will send letter in mail and follow up.

## 2014-10-07 ENCOUNTER — Encounter (HOSPITAL_COMMUNITY): Payer: Self-pay

## 2014-10-07 ENCOUNTER — Encounter (HOSPITAL_COMMUNITY)
Admission: RE | Admit: 2014-10-07 | Discharge: 2014-10-07 | Disposition: A | Discharge status: Home / Self Care | Payer: Medicare HMO | Source: Ambulatory Visit | Attending: Internal Medicine | Admitting: Internal Medicine

## 2014-10-07 VITALS — BP 162/62 | HR 81 | Resp 18 | Ht 65.0 in | Wt 224.0 lb

## 2014-10-07 DIAGNOSIS — J439 Emphysema, unspecified: Secondary | ICD-10-CM | POA: Insufficient documentation

## 2014-10-07 DIAGNOSIS — J438 Other emphysema: Secondary | ICD-10-CM

## 2014-10-07 NOTE — Progress Notes (Signed)
Victoria [name] 69 y.o. female Pulmonary Rehab Orientation Note Patient arrived today in Cardiac and Pulmonary Rehab for orientation to Pulmonary Rehab. She was transported from [employer] via wheel chair. She does not carry portable oxygen. Per pt, she uses oxygen at night with sleep. Color good, skin warm and dry. Patient is oriented to time and place. [name] medical history and medications reviewed. Heart rate is normal, breath sounds clear to auscultation, no wheezes, rales, or rhonchi. Grip strength equal, strong. Distal pulses palpable. Patient reports she does take medications as prescribed. Patient states she follows a Regular diet. The patient reports no specific efforts to gain or lose weight. [name] weight will be monitored closely. Demonstration and practice of PLB using pulse oximeter. Patient able to return demonstration satisfactorily. Safety and hand hygiene in the exercise area reviewed with patient. Patient voices understanding of the information reviewed. Department expectations discussed with patient and achievable goals were set. The patient shows enthusiasm about attending the program and we look forward to working with this nice lady. The patient is scheduled for a 6 min walk test on Tuesday 5/31 at 3:30 and to begin exercise on Thursday 6/2 in the 1:30 class.   1330-1600 45 minutes was spent on a variety of activities such as assessment of the patient, obtaining baseline data including height, weight, BMI, and grip strength, verifying medical history, allergies, and current medications, and teaching patient strategies for performing tasks with less respiratory effort with emphasis on pursed lip breathing.

## 2014-10-15 ENCOUNTER — Encounter (HOSPITAL_COMMUNITY)
Admission: RE | Admit: 2014-10-15 | Discharge: 2014-10-15 | Disposition: A | Discharge status: Home / Self Care | Payer: Medicare HMO | Source: Ambulatory Visit | Attending: Internal Medicine | Admitting: Internal Medicine

## 2014-10-15 DIAGNOSIS — J439 Emphysema, unspecified: Secondary | ICD-10-CM | POA: Diagnosis not present

## 2014-10-15 NOTE — Progress Notes (Signed)
Victoria [name] completed a Six-Minute Walk Test on 10/15/14 . Victoria [name] walked 775 feet with 1 break lasting 1 minute and 30 seconds.  The patient's lowest oxygen saturation was 88% %, highest heart rate was 119 bpm , and highest blood pressure was 190/70. The patient was on room air. Patient stated that shortness of breath hindered their walk test.

## 2014-10-15 NOTE — Progress Notes (Deleted)
Victoria [name] completed a Six-Minute Walk Test on 10/15/14 . Victoria [name] walked 775 feet with 1 break lasting 1 minute and 30 seconds.  The patient's lowest oxygen saturation was 88% %, highest heart rate was 119 bpm , and highest blood pressure was 160/70. The patient was on room air. Patient stated that shortness of breath hindered their walk test.

## 2014-10-16 LAB — HM MAMMOGRAPHY: HM Mammogram: NORMAL (ref 0–4)

## 2014-10-17 ENCOUNTER — Encounter (HOSPITAL_COMMUNITY)
Admission: RE | Admit: 2014-10-17 | Discharge: 2014-10-17 | Disposition: A | Discharge status: Home / Self Care | Payer: Medicare HMO | Source: Ambulatory Visit | Attending: Internal Medicine | Admitting: Internal Medicine

## 2014-10-17 DIAGNOSIS — J439 Emphysema, unspecified: Secondary | ICD-10-CM | POA: Diagnosis not present

## 2014-10-17 NOTE — Progress Notes (Signed)
Today, Victoria [name] exercised at [employer]. [facility]. Service time was from 1330 to 1545.  The patient exercised by performing aerobic, strengthening, and stretching exercises. Oxygen saturation, heart rate, blood pressure, rate of perceived exertion, and shortness of breath were all monitored before, during, and after exercise. Victoria [name] presented with no problems at today's exercise session. Patient attended the Nutrition class our department provided today.     The patient did  have an increase in workload intensity during today's exercise session.  Pre-exercise vitals: . Weight kg: 102.7 . Liters of O2: RA . SpO2: 91 . HR: 96 . BP: 130/50 . CBG: na  Exercise vitals: . Highest heartrate:  106 . Lowest oxygen saturation: 87 which increased very quickly to 93 with rest and purse lip breathing . Highest blood pressure: 182/56 which came down to 130/60 with rest after walking on the treadmill.  Due to elevated blood pressure she was changed from the treadmill to walking on the track.   Victoria [name] of 02: RA  Post-exercise vitals: . SpO2: 92 . HR: 82 . BP: 130/60 . Liters of O2: RA . CBG: na Dr. Brand Males, Medical Director Dr. Grandville Silos is immediately available during today's Pulmonary Rehab session for Victoria [name] on 10/17/2014  at 1330 class time.  [name]

## 2014-10-22 ENCOUNTER — Encounter (HOSPITAL_COMMUNITY): Payer: Medicare HMO

## 2014-10-22 ENCOUNTER — Other Ambulatory Visit: Payer: Self-pay | Admitting: Internal Medicine

## 2014-10-22 ENCOUNTER — Telehealth: Payer: Self-pay | Admitting: Internal Medicine

## 2014-10-22 ENCOUNTER — Encounter (HOSPITAL_COMMUNITY)
Admission: RE | Admit: 2014-10-22 | Discharge: 2014-10-22 | Disposition: A | Discharge status: Home / Self Care | Payer: Medicare HMO | Source: Ambulatory Visit | Attending: Internal Medicine | Admitting: Internal Medicine

## 2014-10-22 DIAGNOSIS — J439 Emphysema, unspecified: Secondary | ICD-10-CM | POA: Diagnosis not present

## 2014-10-22 DIAGNOSIS — I10 Essential (primary) hypertension: Secondary | ICD-10-CM

## 2014-10-22 MED ORDER — NEBIVOLOL HCL 5 MG PO TABS: 5.0000 mg | ORAL_TABLET | Freq: Every day | ORAL | Status: DC

## 2014-10-22 NOTE — Progress Notes (Signed)
Today in Pulmonary Rehab Victoria [name]'s blood pressure was elevated to 180/64 while walking on the track and it took 5 minutes for it to decrease to 140/60, patient did take her blood pressure medications today, which are HCTZ and Amlodipine.  She also was hypertensive when she exercised last week on 10/17/14, as high as 182/56.  I left a message with Rachel Bo, the receptionist at Dr. Geni Bers office with the above information which was to be given to Dr. Ronnald Ramp' nurse.  They will call the patient with further instructions at home.

## 2014-10-22 NOTE — Progress Notes (Addendum)
Today, Victoria [name] exercised at [employer]. [facility]. Service time was from 10:30am to 12:10pm.  The patient exercised by performing aerobic, strengthening, and stretching exercises. Oxygen saturation, heart rate, blood pressure, rate of perceived exertion, and shortness of breath were all monitored before, during, and after exercise. Victoria [name] presented with hypertension at today's exercise session.  Dr. Ronnald Ramp was called to alert them of this issue.    The patient did not have an increase in workload intensity during today's exercise session.  Pre-exercise vitals: . Weight kg: 102.0 . Liters of O2: ra . SpO2: 94 . HR: 81 . BP: 144/84 . CBG: na  Exercise vitals: . Highest heartrate:  96 . Lowest oxygen saturation: 92 . Highest blood pressure: 180/64 . Liters of 02: ra  Post-exercise vitals: . SpO2: 94 . HR: 75 . BP: 118/68 . Liters of O2: ra . CBG: na  Dr. Brand Males, Medical Director Dr. Grandville Silos is immediately available during today's Pulmonary Rehab session for Victoria [name] on 10/22/14 at 10:30am class time.

## 2014-10-22 NOTE — Telephone Encounter (Signed)
RX for bystolic sent to her pharmacy Please get her in soon for a BP check

## 2014-10-22 NOTE — Telephone Encounter (Signed)
Remo Lipps is calling to report elevated BP-  She exercised with them 2 times. When she exercises her BP is as high as 180/70. Remo Lipps believes that the patient might need a change in her meds please advise the patient.

## 2014-10-23 ENCOUNTER — Telehealth: Payer: Self-pay | Admitting: Internal Medicine

## 2014-10-23 DIAGNOSIS — I10 Essential (primary) hypertension: Secondary | ICD-10-CM

## 2014-10-23 NOTE — Telephone Encounter (Signed)
Patient is calling regarding nebivolol (BYSTOLIC) 5 MG tablet [[id number]. Insurance won't cover this. Is there is something else to prescribe. She's wondering if the mg would be raised amLODipine (NORVASC) 10 MG tablet [[id number] and hydrochlorothiazide (HYDRODIURIL) 25 MG tablet [[id number]] if she wouldn't need to take the bp medicine.

## 2014-10-23 NOTE — Telephone Encounter (Signed)
Notified pt with md response pt agreed to start bystolic made a 2 week BP check for 11/07/14...Victoria [name]

## 2014-10-24 ENCOUNTER — Encounter (HOSPITAL_COMMUNITY): Payer: Medicare HMO

## 2014-10-24 ENCOUNTER — Encounter (HOSPITAL_COMMUNITY)
Admission: RE | Admit: 2014-10-24 | Discharge: 2014-10-24 | Disposition: A | Discharge status: Home / Self Care | Payer: Medicare HMO | Source: Ambulatory Visit | Attending: Internal Medicine | Admitting: Internal Medicine

## 2014-10-24 ENCOUNTER — Telehealth: Payer: Self-pay | Admitting: Internal Medicine

## 2014-10-24 MED ORDER — CARVEDILOL 3.125 MG PO TABS: 3.1250 mg | ORAL_TABLET | Freq: Two times a day (BID) | ORAL | Status: DC

## 2014-10-24 NOTE — Telephone Encounter (Signed)
Notified pt with md response.../lmb 

## 2014-10-24 NOTE — Progress Notes (Signed)
Patient attended the Diaphragmatic and Purse Lip Breathing class today given by Tammy, a respiratory therapist.  Victoria [name] did not exercise today due to starting on a new blood pressure medication later today.  We would like her to be on the new med at least 1 week before returning to exercise.  She is aware she may return next Thursday.

## 2014-10-24 NOTE — Telephone Encounter (Signed)
Patient is requesting refills for amLODipine (NORVASC) 10 MG tablet [[id number] and lovastatin (MEVACOR) 20 MG tablet [[id number]. Pharmacy is [facility] on [address]

## 2014-10-24 NOTE — Telephone Encounter (Signed)
Duplicate

## 2014-10-24 NOTE — Telephone Encounter (Signed)
Changed to a generic She is on max dose of amlodipine and HCTZ

## 2014-10-28 ENCOUNTER — Telehealth: Payer: Self-pay

## 2014-10-28 NOTE — Telephone Encounter (Signed)
Patient called to educate on Medicare Wellness apt. LVM for the patient to call back to educate and schedule for wellness visit.   

## 2014-10-28 NOTE — Telephone Encounter (Signed)
Call to the patient to schedule AWV and stated she needed Amolodipine and lovastatin refilled; checked and med was refilled; should be at pharmacy. Called the patient back to let her know medicine should be at the pharmacy. Scheduled AWV for  6/14 at 10:30

## 2014-10-29 ENCOUNTER — Ambulatory Visit (INDEPENDENT_AMBULATORY_CARE_PROVIDER_SITE_OTHER): Payer: Medicare HMO

## 2014-10-29 ENCOUNTER — Encounter (HOSPITAL_COMMUNITY): Payer: Medicare HMO

## 2014-10-29 VITALS — BP 124/64 | Ht 64.5 in | Wt 223.1 lb

## 2014-10-29 DIAGNOSIS — Z23 Encounter for immunization: Secondary | ICD-10-CM

## 2014-10-29 DIAGNOSIS — Z0000 Encounter for general adult medical examination without abnormal findings: Secondary | ICD-10-CM
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
## 2014-10-29 NOTE — Patient Instructions (Addendum)
Victoria [name] , Thank you for taking time to come for your Medicare Wellness Visit. I appreciate your ongoing commitment to your health goals. Please review the following plan we discussed and let me know if I can assist you in the future.   [employer] for classes; [phone] for computer training or more info on classes Gave information on shingles PSV 23 updated  Will investigate [place] for resources related to weight loss; dietician services; or investigate the [facility] services at [facility] Given information  With number;     These are the goals we discussed: All   This is a list of the screening recommended for you and due dates:  Health Maintenance  Topic Date Due  . Shingles Vaccine  01/30/2004  . Mammogram  11/01/2012  . Pneumonia vaccines (2 of 2 - PPSV23) 02/01/2014  . Flu Shot  12/16/2014  . Tetanus Vaccine  12/05/2021  . Colon Cancer Screening  02/07/2023  . DEXA scan (bone density measurement)  Completed     Bone Densitometry Bone densitometry is a special X-ray that measures your bone density and can be used to help predict your risk of bone fractures. This test is used to determine bone mineral content and density to diagnose osteoporosis. Osteoporosis is the loss of bone that may cause the bone to become weak. Osteoporosis commonly occurs in women entering menopause. However, it may be found in men and in people with other diseases. PREPARATION FOR TEST No preparation necessary. WHO SHOULD BE TESTED?  All women older than 78.  Postmenopausal women (50 to 33) with risk factors for osteoporosis.  People with a previous fracture caused by normal activities.  People with a small body frame (less than 127 poundsor a body mass index [BMI] of less than 21).  People who have a parent with a hip fracture or history of osteoporosis.  People who smoke.  People who have rheumatoid arthritis.  Anyone who engages in excessive alcohol use (more than 3  drinks most days).  Women who experience early menopause. WHEN SHOULD YOU BE RETESTED? Current guidelines suggest that you should wait at least 2 years before doing a bone density test again if your first test was normal.Recent studies indicated that women with normal bone density may be able to wait a few years before needing to repeat a bone density test. You should discuss this with your caregiver.  NORMAL FINDINGS   Normal: less than standard deviation below normal (greater than -1).  Osteopenia: 1 to 2.5 standard deviations below normal (-1 to -2.5).  Osteoporosis: greater than 2.5 standard deviations below normal (less than -2.5). Test results are reported as a "T score" and a "Z score."The T score is a number that compares your bone density with the bone density of healthy, young women.The Z score is a number that compares your bone density with the scores of women who are the same age, gender, and race.  Ranges for normal findings may vary among different laboratories and hospitals. You should always check with your doctor after having lab work or other tests done to discuss the meaning of your test results and whether your values are considered within normal limits. MEANING OF TEST  Your caregiver will go over the test results with you and discuss the importance and meaning of your results, as well as treatment options and the need for additional tests if necessary. OBTAINING THE TEST RESULTS It is your responsibility to obtain your test results. Ask the lab or  department performing the test when and how you will get your results. Document Released: 05/25/2004 Document Revised: 07/26/2011 Document Reviewed: 06/17/2010 [facility] Patient Information 2015 [place], [place]. This information is not intended to replace advice given to you by your health care provider. Make sure you discuss any questions you have with your health care provider.  Fall Prevention and Home Safety Falls cause  injuries and can affect all age groups. It is possible to prevent falls.  HOW TO PREVENT FALLS  Wear shoes with rubber soles that do not have an opening for your toes.  Keep the inside and outside of your house well lit.  Use night lights throughout your home.  Remove clutter from floors.  Clean up floor spills.  Remove throw rugs or fasten them to the floor with carpet tape.  Do not place electrical cords across pathways.  Put grab bars by your tub, shower, and toilet. Do not use towel bars as grab bars.  Put handrails on both sides of the stairway. Fix loose handrails.  Do not climb on stools or stepladders, if possible.  Do not wax your floors.  Repair uneven or unsafe sidewalks, walkways, or stairs.  Keep items you use a lot within reach.  Be aware of pets.  Keep emergency numbers next to the telephone.  Put smoke detectors in your home and near bedrooms. Ask your doctor what other things you can do to prevent falls. Document Released: 02/27/2009 Document Revised: 11/02/2011 Document Reviewed: 08/03/2011 [facility] Patient Information 2015 [place], [place]. This information is not intended to replace advice given to you by your health care provider. Make sure you discuss any questions you have with your health care provider.  Glaucoma Glaucoma happens when the fluid pressure in the eyeball is too high. The pressure cannot stay high for too long, or the eyeball may become damaged. Signs of glaucoma include:  Having a hard time seeing in a dark room after being in a bright one.  Having trouble seeing things out to the sides of you.  Blurry sight.  Seeing bright white lights or colors in front of your eyes.  Headaches.  Feeling sick to your stomach (nauseous) or throwing up (vomiting).  Sudden vision loss. Glaucoma testing is an important part of taking care of your eyesight. HOME CARE  Always use your eyedrops or pills as told by your doctor. Do not run  out.  Do not go away from home without your eyedrops or pills.  Keep your appointments.  Always tell a new doctor that you have glaucoma and how long you have had it. Tell the doctor about the eyedrops and pills you take.  Do not use eyedrops or pills that have not been prescribed by your doctor. GET HELP RIGHT AWAY IF:  You develop severe pain in the affected eye.  You develop vision problems.  You develop a bad headache in the area around the eye.  You feel sick to your stomach or throw up.  You start to have problems with the other eye. MAKE SURE YOU:   Understand these instructions.  Will watch your condition.  Will get help right away if you are not doing well or get worse. Document Released: 02/10/2008 Document Revised: 07/26/2011 Document Reviewed: 02/10/2008 [facility] Patient Information 2015 [place], [place]. This information is not intended to replace advice given to you by your health care provider. Make sure you discuss any questions you have with your health care provider.  Health Maintenance Adopting a healthy lifestyle  and getting preventive care can go a long way to promote health and wellness. Talk with your health care provider about what schedule of regular examinations is right for you. This is a good chance for you to check in with your provider about disease prevention and staying healthy. In between checkups, there are plenty of things you can do on your own. Experts have done a lot of research about which lifestyle changes and preventive measures are most likely to keep you healthy. Ask your health care provider for more information. WEIGHT AND DIET  Eat a healthy diet  Be sure to include plenty of vegetables, fruits, low-fat dairy products, and lean protein.  Do not eat a lot of foods high in solid fats, added sugars, or salt.  Get regular exercise. This is one of the most important things you can do for your health.  Most adults should exercise for at  least 150 minutes each week. The exercise should increase your heart rate and make you sweat (moderate-intensity exercise).  Most adults should also do strengthening exercises at least twice a week. This is in addition to the moderate-intensity exercise.  Maintain a healthy weight  Body mass index (BMI) is a measurement that can be used to identify possible weight problems. It estimates body fat based on height and weight. Your health care provider can help determine your BMI and help you achieve or maintain a healthy weight.  For females 4 years of age and older:   A BMI below 18.5 is considered underweight.  A BMI of 18.5 to 24.9 is normal.  A BMI of 25 to 29.9 is considered overweight.  A BMI of 30 and above is considered obese.  Watch levels of cholesterol and blood lipids  You should start having your blood tested for lipids and cholesterol at 71 years of age, then have this test every 5 years.  You may need to have your cholesterol levels checked more often if:  Your lipid or cholesterol levels are high.  You are older than 71 years of age.  You are at high risk for heart disease.  CANCER SCREENING   Lung Cancer  Lung cancer screening is recommended for adults 3-61 years old who are at high risk for lung cancer because of a history of smoking.  A yearly low-dose CT scan of the lungs is recommended for people who:  Currently smoke.  Have quit within the past 15 years.  Have at least a 30-pack-year history of smoking. A pack year is smoking an average of one pack of cigarettes a day for 1 year.  Yearly screening should continue until it has been 15 years since you quit.  Yearly screening should stop if you develop a health problem that would prevent you from having lung cancer treatment.  Breast Cancer  Practice breast self-awareness. This means understanding how your breasts normally appear and feel.  It also means doing regular breast self-exams. Let your  health care provider know about any changes, no matter how small.  If you are in your 20s or 30s, you should have a clinical breast exam (CBE) by a health care provider every 1-3 years as part of a regular health exam.  If you are 68 or older, have a CBE every year. Also consider having a breast X-ray (mammogram) every year.  If you have a family history of breast cancer, talk to your health care provider about genetic screening.  If you are at high risk for breast  cancer, talk to your health care provider about having an MRI and a mammogram every year.  Breast cancer gene (BRCA) assessment is recommended for women who have family members with BRCA-related cancers. BRCA-related cancers include:  Breast.  Ovarian.  Tubal.  Peritoneal cancers.  Results of the assessment will determine the need for genetic counseling and BRCA1 and BRCA2 testing. Cervical Cancer Routine pelvic examinations to screen for cervical cancer are no longer recommended for nonpregnant women who are considered low risk for cancer of the pelvic organs (ovaries, uterus, and vagina) and who do not have symptoms. A pelvic examination may be necessary if you have symptoms including those associated with pelvic infections. Ask your health care provider if a screening pelvic exam is right for you.   The Pap test is the screening test for cervical cancer for women who are considered at risk.  If you had a hysterectomy for a problem that was not cancer or a condition that could lead to cancer, then you no longer need Pap tests.  If you are older than 65 years, and you have had normal Pap tests for the past 10 years, you no longer need to have Pap tests.  If you have had past treatment for cervical cancer or a condition that could lead to cancer, you need Pap tests and screening for cancer for at least 20 years after your treatment.  If you no longer get a Pap test, assess your risk factors if they change (such as having a  new sexual partner). This can affect whether you should start being screened again.  Some women have medical problems that increase their chance of getting cervical cancer. If this is the case for you, your health care provider may recommend more frequent screening and Pap tests.  The human papillomavirus (HPV) test is another test that may be used for cervical cancer screening. The HPV test looks for the virus that can cause cell changes in the cervix. The cells collected during the Pap test can be tested for HPV.  The HPV test can be used to screen women 69 years of age and older. Getting tested for HPV can extend the interval between normal Pap tests from three to five years.  An HPV test also should be used to screen women of any age who have unclear Pap test results.  After 71 years of age, women should have HPV testing as often as Pap tests.  Colorectal Cancer  This type of cancer can be detected and often prevented.  Routine colorectal cancer screening usually begins at 71 years of age and continues through 71 years of age.  Your health care provider may recommend screening at an earlier age if you have risk factors for colon cancer.  Your health care provider may also recommend using home test kits to check for hidden blood in the stool.  A small camera at the end of a tube can be used to examine your colon directly (sigmoidoscopy or colonoscopy). This is done to check for the earliest forms of colorectal cancer.  Routine screening usually begins at age 43.  Direct examination of the colon should be repeated every 5-10 years through 71 years of age. However, you may need to be screened more often if early forms of precancerous polyps or small growths are found. Skin Cancer  Check your skin from head to toe regularly.  Tell your health care provider about any new moles or changes in moles, especially if there is a  change in a mole's shape or color.  Also tell your health care  provider if you have a mole that is larger than the size of a pencil eraser.  Always use sunscreen. Apply sunscreen liberally and repeatedly throughout the day.  Protect yourself by wearing long sleeves, pants, a wide-brimmed hat, and sunglasses whenever you are outside. HEART DISEASE, DIABETES, AND HIGH BLOOD PRESSURE   Have your blood pressure checked at least every 1-2 years. High blood pressure causes heart disease and increases the risk of stroke.  If you are between 56 years and 51 years old, ask your health care provider if you should take aspirin to prevent strokes.  Have regular diabetes screenings. This involves taking a blood sample to check your fasting blood sugar level.  If you are at a normal weight and have a low risk for diabetes, have this test once every three years after 71 years of age.  If you are overweight and have a high risk for diabetes, consider being tested at a younger age or more often. PREVENTING INFECTION  Hepatitis B  If you have a higher risk for hepatitis B, you should be screened for this virus. You are considered at high risk for hepatitis B if:  You were born in a country where hepatitis B is common. Ask your health care provider which countries are considered high risk.  Your parents were born in a high-risk country, and you have not been immunized against hepatitis B (hepatitis B vaccine).  You have HIV or AIDS.  You use needles to inject street drugs.  You live with someone who has hepatitis B.  You have had sex with someone who has hepatitis B.  You get hemodialysis treatment.  You take certain medicines for conditions, including cancer, organ transplantation, and autoimmune conditions. Hepatitis C  Blood testing is recommended for:  Everyone born from [birth year] through 1967.  Anyone with known risk factors for hepatitis C. Sexually transmitted infections (STIs)  You should be screened for sexually transmitted infections (STIs)  including gonorrhea and chlamydia if:  You are sexually active and are younger than 71 years of age.  You are older than 71 years of age and your health care provider tells you that you are at risk for this type of infection.  Your sexual activity has changed since you were last screened and you are at an increased risk for chlamydia or gonorrhea. Ask your health care provider if you are at risk.  If you do not have HIV, but are at risk, it may be recommended that you take a prescription medicine daily to prevent HIV infection. This is called pre-exposure prophylaxis (PrEP). You are considered at risk if:  You are sexually active and do not regularly use condoms or know the HIV status of your partner(s).  You take drugs by injection.  You are sexually active with a partner who has HIV. Talk with your health care provider about whether you are at high risk of being infected with HIV. If you choose to begin PrEP, you should first be tested for HIV. You should then be tested every 3 months for as long as you are taking PrEP.  PREGNANCY   If you are premenopausal and you may become pregnant, ask your health care provider about preconception counseling.  If you may become pregnant, take 400 to 800 micrograms (mcg) of folic acid every day.  If you want to prevent pregnancy, talk to your health care provider about birth control (  contraception). OSTEOPOROSIS AND MENOPAUSE   Osteoporosis is a disease in which the bones lose minerals and strength with aging. This can result in serious bone fractures. Your risk for osteoporosis can be identified using a bone density scan.  If you are 49 years of age or older, or if you are at risk for osteoporosis and fractures, ask your health care provider if you should be screened.  Ask your health care provider whether you should take a calcium or vitamin D supplement to lower your risk for osteoporosis.  Menopause may have certain physical symptoms and  risks.  Hormone replacement therapy may reduce some of these symptoms and risks. Talk to your health care provider about whether hormone replacement therapy is right for you.  HOME CARE INSTRUCTIONS   Schedule regular health, dental, and eye exams.  Stay current with your immunizations.   Do not use any tobacco products including cigarettes, chewing tobacco, or electronic cigarettes.  If you are pregnant, do not drink alcohol.  If you are breastfeeding, limit how much and how often you drink alcohol.  Limit alcohol intake to no more than 1 drink per day for nonpregnant women. One drink equals 12 ounces of beer, 5 ounces of wine, or 1 ounces of hard liquor.  Do not use street drugs.  Do not share needles.  Ask your health care provider for help if you need support or information about quitting drugs.  Tell your health care provider if you often feel depressed.  Tell your health care provider if you have ever been abused or do not feel safe at home. Document Released: 11/16/2010 Document Revised: 09/17/2013 Document Reviewed: 04/04/2013 [facility] Patient Information 2015 [place], [place]. This information is not intended to replace advice given to you by your health care provider. Make sure you discuss any questions you have with your health care provider.  Mammography Mammography is an X-ray of the breasts to look for changes that are not normal. The X-ray image is called a mammogram. This procedure can screen for breast cancer, can detect cancer early, and can diagnose cancer.  LET YOUR CAREGIVER KNOW ABOUT:  Breast implants.  Previous breast disease, biopsy, or surgery.  If you are breastfeeding.  Medicines taken, including vitamins, herbs, eyedrops, over-the-counter medicines, and creams.  Use of steroids (by mouth or creams).  Possibility of pregnancy, if this applies. RISKS AND COMPLICATIONS  Exposure to radiation, but at very low levels.  The results may be  misinterpreted.  The results may not be accurate.  Mammography may lead to further tests.  Mammography may not catch certain cancers. BEFORE THE PROCEDURE  Schedule your test about 7 days after your menstrual period. This is when your breasts are the least tender and have signs of hormone changes.  If you have had a mammography done at a different facility in the past, get the mammogram X-rays or have them sent to your current exam facility in order to compare them.  Wash your breasts and under your arms the day of the test.  Do not wear deodorants, perfumes, or powders anywhere on your body.  Wear clothes that you can change in and out of easily. PROCEDURE Relax as much as possible during the test. Any discomfort during the test will be very brief. The test should take less than 30 minutes. The following will happen:  You will undress from the waist up and put on a gown.  You will stand in front of the X-ray machine.  Each breast  will be placed between 2 plastic or glass plates. The plates will compress your breast for a few seconds.  X-rays will be taken from different angles of the breast. AFTER THE PROCEDURE  The mammogram will be examined.  Depending on the quality of the images, you may need to repeat certain parts of the test.  Ask when your test results will be ready. Make sure you get your test results.  You may resume normal activities. Document Released: 04/30/2000 Document Revised: 07/26/2011 Document Reviewed: 02/21/2011 [facility] Patient Information 2015 [place], [place]. This information is not intended to replace advice given to you by your health care provider. Make sure you discuss any questions you have with your health care provider.

## 2014-10-31 ENCOUNTER — Other Ambulatory Visit: Payer: Self-pay | Admitting: Internal Medicine

## 2014-10-31 ENCOUNTER — Encounter (HOSPITAL_COMMUNITY): Payer: Medicare HMO

## 2014-10-31 ENCOUNTER — Encounter (HOSPITAL_COMMUNITY)
Admission: RE | Admit: 2014-10-31 | Discharge: 2014-10-31 | Disposition: A | Discharge status: Home / Self Care | Payer: Medicare HMO | Source: Ambulatory Visit | Attending: Internal Medicine | Admitting: Internal Medicine

## 2014-10-31 NOTE — Telephone Encounter (Signed)
Pt called in for a refill on her amLODipine (NORVASC) 10 MG tablet [[id number]] and Lovastatin    [facility] on [address]

## 2014-10-31 NOTE — Telephone Encounter (Signed)
Refills sent to [facility]...Victoria [name]

## 2014-10-31 NOTE — Progress Notes (Signed)
Victoria [name] attended the Oxygen Safety class today, but did not exercise.

## 2014-11-01 ENCOUNTER — Telehealth: Payer: Self-pay

## 2014-11-01 NOTE — Telephone Encounter (Signed)
During AWV; patient stated she will fup on mammogram in June

## 2014-11-05 ENCOUNTER — Encounter (HOSPITAL_COMMUNITY): Payer: Medicare HMO

## 2014-11-05 ENCOUNTER — Encounter (HOSPITAL_COMMUNITY)
Admission: RE | Admit: 2014-11-05 | Discharge: 2014-11-05 | Disposition: A | Discharge status: Home / Self Care | Payer: Medicare HMO | Source: Ambulatory Visit | Attending: Internal Medicine | Admitting: Internal Medicine

## 2014-11-05 DIAGNOSIS — J439 Emphysema, unspecified: Secondary | ICD-10-CM | POA: Diagnosis not present

## 2014-11-05 NOTE — Progress Notes (Signed)
Today, Victoria [name] exercised at [employer]. Service time was from 1030 to 1210.  The patient exercised by performing aerobic, strengthening, and stretching exercises. Oxygen saturation, heart rate, blood pressure, rate of perceived exertion, and shortness of breath were all monitored before, during, and after exercise. [name] presented with no problems at today's exercise session.  The patient did  have an increase in workload intensity during today's exercise session.  Pre-exercise vitals: . Weight kg: 100.0 . Liters of O2: RA . SpO2: 92 . HR: 76 . BP: 144/66 . CBG: na  Exercise vitals: . Highest heartrate:  89 . Lowest oxygen saturation: 87 . Highest blood pressure: 168/80 . Liters of 02: RA  Post-exercise vitals: . SpO2: 94 . HR: 84 . BP: 140/70 . Liters of O2: RA . CBG: na Dr. Brand Males, Medical Director Dr. Tana Coast is immediately available during today's Pulmonary Rehab session for Victoria [name] on 11/05/2014  at 1030 class time.  [name]

## 2014-11-07 ENCOUNTER — Encounter (HOSPITAL_COMMUNITY): Payer: Medicare HMO

## 2014-11-07 ENCOUNTER — Encounter (HOSPITAL_COMMUNITY)
Admission: RE | Admit: 2014-11-07 | Discharge: 2014-11-07 | Disposition: A | Discharge status: Home / Self Care | Payer: Medicare HMO | Source: Ambulatory Visit | Attending: Internal Medicine | Admitting: Internal Medicine

## 2014-11-07 DIAGNOSIS — J439 Emphysema, unspecified: Secondary | ICD-10-CM | POA: Diagnosis not present

## 2014-11-07 NOTE — Progress Notes (Signed)
Today, Victoria [name] exercised at [employer]. [facility]. Service time was from 10:30am to 12:05pm.  The patient exercised by performing aerobic, strengthening, and stretching exercises. Oxygen saturation, heart rate, blood pressure, rate of perceived exertion, and shortness of breath were all monitored before, during, and after exercise. Victoria [name] presented with no problems at today's exercise session. The patient attended education class today on Living with Pulmonary Disease.  The patient did not have an increase in workload intensity during today's exercise session.  Pre-exercise vitals: . Weight kg: 100.7 . Liters of O2: ra . SpO2: 92 . HR: 77 . BP: 110/60 . CBG: na  Exercise vitals: . Highest heartrate:  88 . Lowest oxygen saturation: 88 . Highest blood pressure: 122/64 . Liters of 02: ra  Post-exercise vitals: . SpO2: 94 . HR: 75 . BP: 116/60 . Liters of O2: ra . CBG: na  Dr. Brand Males, Medical Director Dr. Hartford Poli is immediately available during today's Pulmonary Rehab session for Victoria [name] on 11/07/14 at 10:30am class time.

## 2014-11-12 ENCOUNTER — Encounter (HOSPITAL_COMMUNITY)
Admission: RE | Admit: 2014-11-12 | Discharge: 2014-11-12 | Disposition: A | Discharge status: Home / Self Care | Payer: Medicare HMO | Source: Ambulatory Visit | Attending: Internal Medicine | Admitting: Internal Medicine

## 2014-11-12 ENCOUNTER — Encounter (HOSPITAL_COMMUNITY): Payer: Medicare HMO

## 2014-11-12 DIAGNOSIS — J439 Emphysema, unspecified: Secondary | ICD-10-CM | POA: Diagnosis not present

## 2014-11-12 NOTE — Progress Notes (Signed)
Today, Haani exercised at [facility]. Service time was from 10:30am to 12:10pm.  The patient exercised by performing aerobic, strengthening, and stretching exercises. Oxygen saturation, heart rate, blood pressure, rate of perceived exertion, and shortness of breath were all monitored before, during, and after exercise. Truc presented with no problems at today's exercise session.  The patient did have an increase in workload intensity during today's exercise session.  Pre-exercise vitals: . Weight kg: 102.4 . Liters of O2: ra . SpO2: 92 . HR: 73 . BP: 142/62 . CBG: na  Exercise vitals: . Highest heartrate:  84 . Lowest oxygen saturation: 91 . Highest blood pressure: 136/70 . Liters of 02: ra  Post-exercise vitals: . SpO2: 91 . HR: 71 . BP: 134/70 . Liters of O2: ra . CBG: na  Dr. Brand Males, Medical Director Dr. Hartford Poli is immediately available during today's Pulmonary Rehab session for [name] on 11/12/14 at 10:30am class time.

## 2014-11-14 ENCOUNTER — Telehealth: Payer: Self-pay | Admitting: Internal Medicine

## 2014-11-14 ENCOUNTER — Encounter (HOSPITAL_COMMUNITY): Payer: Medicare HMO

## 2014-11-14 ENCOUNTER — Encounter (HOSPITAL_COMMUNITY)
Admission: RE | Admit: 2014-11-14 | Discharge: 2014-11-14 | Disposition: A | Discharge status: Home / Self Care | Payer: Medicare HMO | Source: Ambulatory Visit | Attending: Internal Medicine | Admitting: Internal Medicine

## 2014-11-14 DIAGNOSIS — J439 Emphysema, unspecified: Secondary | ICD-10-CM | POA: Diagnosis not present

## 2014-11-14 NOTE — Progress Notes (Signed)
Victoria [name] 69 y.o. female Nutrition Note Spoke with pt. Pt is obese. Pt has gained 27 lb over the past 4 years. Pt reports she wants to lose wt. Per discussion, pt is in the pre-contemplative state of change. Pt asked this Probation officer re: fad diets. Fad diets versus adopting healthy eating behaviors discussed. There are some ways the pt can make her eating habits healthier. Pt's Rate Your Plate results reviewed with pt. Pt avoids many salty food; "sometimes" uses canned/ convenience food.  Pt does not add salt to food.  The role of sodium in lung disease reviewed with pt. Pt expressed understanding of the information reviewed. No results found for: HGBA1C  Nutrition Diagnosis ? Food-and nutrition-related knowledge deficit related to lack of exposure to information as related to diagnosis of pulmonary disease ? Obesity related to excessive energy intake as evidenced by a BMI of 37.3 ?  Nutrition Intervention ? Pt's individual nutrition plan and goals reviewed with pt. ? Benefits of adopting healthy eating habits discussed when pt's Rate Your Plate reviewed. ? Pt to attend the Nutrition and Lung Disease class ? Continual client-centered nutrition education by RD, as part of interdisciplinary care. Goal(s) 1. Identify food quantities necessary to achieve wt loss of  -2# per week to a goal wt of 90.7-98.9 kg (200-218 lb) at graduation from pulmonary rehab. 2. Describe the benefit of including fruits, vegetables, whole grains, and low-fat dairy products in a healthy meal plan. Monitor and Evaluate progress toward nutrition goal with team.   Derek Mound, M.Ed, RD, LDN, CDE 11/14/2014 12:24 PM

## 2014-11-14 NOTE — Progress Notes (Addendum)
Today, Victoria [name] exercised at [facility]. Service time was from 1030 to 1210.  The patient exercised by performing aerobic, strengthening, and stretching exercises. Oxygen saturation, heart rate, blood pressure, rate of perceived exertion, and shortness of breath were all monitored before, during, and after exercise. Victoria [name] presented with no problems at today's exercise session. She attended class with [place].  The patient did  have an increase in workload intensity during today's exercise session.  Pre-exercise vitals: . Weight kg: 102.3 . Liters of O2: RA . SpO2: 92 . HR: 78 . BP: 136/76 . CBG: NA  Exercise vitals: . Highest heartrate:  83 . Lowest oxygen saturation: 95 . Highest blood pressure: 144/64 . Liters of 02: RA  Post-exercise vitals: . SpO2: 94 . HR: 71 . BP: 120/62 . Liters of O2: RA . CBG: NA Dr. Brand Males, Medical Director Dr. Sloan Leiter is immediately available during today's Pulmonary Rehab session for Victoria [name] on 11/14/2014  at 1030 class time.

## 2014-11-14 NOTE — Telephone Encounter (Signed)
Spoke with pt. States that she has been waking up with headaches. Was told by a man who works for a medical alert company that if she is waking up with headaches she could have OSA. Wanting to know if she could be tested for this since she also snores a lot too. Spoke to [place] about the COPD research study. She will contact the pt today.  MR - please advise on sleep study.

## 2014-11-14 NOTE — Telephone Encounter (Signed)
Refer her to one of the sleep docs in office please

## 2014-11-14 NOTE — Telephone Encounter (Signed)
lmtcb for pt.  

## 2014-11-14 NOTE — Telephone Encounter (Signed)
Spoke with pt. She is scheduled to see RA in AM at 9am for sleep consult since he had an opening. Nothing further needed

## 2014-11-15 ENCOUNTER — Encounter: Payer: Self-pay | Admitting: Pulmonary Disease

## 2014-11-15 ENCOUNTER — Ambulatory Visit (INDEPENDENT_AMBULATORY_CARE_PROVIDER_SITE_OTHER): Payer: Medicare HMO | Admitting: Pulmonary Disease

## 2014-11-15 VITALS — BP 148/74 | HR 75 | Ht 65.0 in | Wt 224.0 lb

## 2014-11-15 DIAGNOSIS — G4733 Obstructive sleep apnea (adult) (pediatric): Secondary | ICD-10-CM | POA: Diagnosis not present

## 2014-11-15 NOTE — Patient Instructions (Signed)
Sleep study will be scheduled 

## 2014-11-15 NOTE — Assessment & Plan Note (Signed)
Given excessive daytime somnolence, narrow pharyngeal exam, witnessed apneas & loud snoring, obstructive sleep apnea is very likely & an overnight polysomnogram will be scheduled as a split study since she is on noct O2 The pathophysiology of obstructive sleep apnea , it's cardiovascular consequences & modes of treatment including CPAP were discused with the patient in detail & they evidenced understanding.

## 2014-11-15 NOTE — Progress Notes (Signed)
Subjective:    Patient ID: Victoria [name], female    DOB: May 14, 1946, 69 y.o.   MRN: [id number]  HPI   69 year old ex-smoker with COPD, presents for evaluation of sleep-disordered breathing. She uses 2.5 L oxygen at night. She is enrolled in pulmonary rehabilitation and is trying to lose weight . She quit smoking in 2012 Reports loud snoring and occasional morning bifrontal headaches requiring Tylenol and nasal spray. Epworth sleepiness score is 6 Bedtime is around 9 PM, sleep latency is variable, she likes to sleep with the TV on and has a timer, she sleeps on her left side with 3 pillows, reports 2 nocturnal awakenings including nocturia and is out of bed by 7:30 AM feeling tired with severe dryness of mouth. There is no history suggestive of cataplexy, sleep paralysis or parasomnias   Significant tests/ events  06/2011 PFTs-FEV1 43%, DLCO 51%    Past Medical History  Diagnosis Date  . Asthma     uses advair   . Arthritis     left ankle, right knee, right SI joint, wrists  . Varicella as child  . 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 Hypertension   . Hyperlipidemia   . Tuberculosis 2009    h/o positive PPD- [facility] at [facility], had  normal chest x-ray. she was not treated for a  new conversion.  [name] COPD (chronic obstructive pulmonary disease)   . Murmur, cardiac 1950    Past Surgical History  Procedure Laterality Date  . Excision of pelvic absess, right ovary      2008  . Appendectomy      2008  . Cesarean section      1972   Allergies  Allergen Reactions  . Nickel Rash    Severe rash to infection: pt is allergic to all metals other than sterling silver or gold jewelry.   Victoria [name] [Olmesartan] Swelling    Swelling of face and arms   . [name] [Valsartan] Swelling    Swelling of face and arms   . Lisinopril Cough  . Codeine Other (See Comments)    jittery  . Monosodium Glutamate Other (See Comments)    Facial swelling per pt    History    Social History  . Marital Status: Divorced    Spouse Name: N/A  . Number of Children: 1  . Years of Education: 16   Occupational History  . counselor, [place]    Social History Main Topics  . Smoking status: Former Smoker -- 0.50 packs/day for 40 years    Types: Cigarettes    Quit date: 04/16/2011  . Smokeless tobacco: Former Systems developer    Quit date: 04/16/2011     Comment: quit that date when she had to go to ER   . Alcohol Use: 0.0 oz/week    0 Standard drinks or equivalent per week     Comment: rare occasion  . Drug Use: No  . Sexual Activity: No   Other Topics Concern  . Not on file   Social History Narrative   HSG; Orlinda [name], [name]. . Married '69 - 9 yrs/divorced. 1 son - '72; no grandchildren.   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 No h/o physical or sexual abuse. ACP - no living will - wants information. Provided packet of information. On 08/05/2011: she stated she was distant cousins to celebrities Victoria [name] and Victoria [name]  Review of Systems  Constitutional: Negative for fever, chills and unexpected weight change.  HENT: Negative for congestion, dental problem, ear pain, nosebleeds, postnasal drip, rhinorrhea, sinus pressure, sneezing, sore throat, trouble swallowing and voice change.   Eyes: Negative for visual disturbance.  Respiratory: Negative for cough, choking and shortness of breath.   Cardiovascular: Negative for chest pain and leg swelling.  Gastrointestinal: Negative for vomiting, abdominal pain and diarrhea.  Genitourinary: Negative for difficulty urinating.  Musculoskeletal: Negative for arthralgias.  Skin: Negative for rash.  Neurological: Positive for headaches. Negative for tremors and syncope.  Hematological: Does not bruise/bleed easily.       Objective:   Physical Exam  Gen. Pleasant, obese, in no distress, normal affect ENT - no lesions, no post nasal drip,  class 2-3 airway Neck: No JVD, no thyromegaly, no carotid bruits Lungs: no use of accessory muscles, no dullness to percussion, decreased without rales or rhonchi  Cardiovascular: Rhythm regular, heart sounds  normal, no murmurs or gallops, no peripheral edema Abdomen: soft and non-tender, no hepatosplenomegaly, BS normal. Musculoskeletal: No deformities, no cyanosis or clubbing Neuro:  alert, non focal, no tremors        Assessment & Plan:

## 2014-11-19 ENCOUNTER — Encounter (HOSPITAL_COMMUNITY): Payer: Medicare HMO

## 2014-11-19 ENCOUNTER — Encounter (HOSPITAL_COMMUNITY)
Admission: RE | Admit: 2014-11-19 | Discharge: 2014-11-19 | Disposition: A | Discharge status: Home / Self Care | Payer: Medicare HMO | Source: Ambulatory Visit | Attending: Internal Medicine | Admitting: Internal Medicine

## 2014-11-19 DIAGNOSIS — J439 Emphysema, unspecified: Secondary | ICD-10-CM | POA: Diagnosis not present

## 2014-11-19 NOTE — Progress Notes (Signed)
Today, Victoria [name] exercised at [facility]. Victoria [name]. Service time was from 10:30am to 12:05pm.  The patient exercised by performing aerobic, strengthening, and stretching exercises. Oxygen saturation, heart rate, blood pressure, rate of perceived exertion, and shortness of breath were all monitored before, during, and after exercise. Victoria [name] presented with no problems at today's exercise session.  The patient did have an increase in workload intensity during today's exercise session.  Pre-exercise vitals: . Weight kg: 103.1 . Liters of O2: ra . SpO2: 94 . HR: 78 . BP: 156/74 . CBG: na  Exercise vitals: . Highest heartrate:  84 . Lowest oxygen saturation: 94 . Highest blood pressure: 130/64 . Liters of 02: ra  Post-exercise vitals: . SpO2: 93 . HR: 77 . BP: 124/60 . Liters of O2: ra . CBG: na  Dr. Brand Males, Medical Director Dr. Sloan Leiter is immediately available during today's Pulmonary [name] session for Victoria [name] on 11/19/14 at 10:30am class time.

## 2014-11-21 ENCOUNTER — Encounter (HOSPITAL_COMMUNITY): Payer: Medicare HMO

## 2014-11-21 ENCOUNTER — Encounter (HOSPITAL_COMMUNITY)
Admission: RE | Admit: 2014-11-21 | Discharge: 2014-11-21 | Disposition: A | Discharge status: Home / Self Care | Payer: Medicare HMO | Source: Ambulatory Visit | Attending: Internal Medicine | Admitting: Internal Medicine

## 2014-11-21 DIAGNOSIS — J439 Emphysema, unspecified: Secondary | ICD-10-CM | POA: Diagnosis not present

## 2014-11-21 NOTE — Progress Notes (Signed)
Today, Victoria [name] exercised at [facility]. Service time was from 1030 to 1225.  The patient exercised by performing aerobic, strengthening, and stretching exercises. Oxygen saturation, heart rate, blood pressure, rate of perceived exertion, and shortness of breath were all monitored before, during, and after exercise. Victoria [name] presented with no problems at today's exercise session. She attended class on exercise for the pulmonary patient today.  The patient did not have an increase in workload intensity during today's exercise session.  Pre-exercise vitals: . Weight kg: 101.5 . Liters of O2: RA . SpO2: 90 . HR: 78 . BP: 126/72 . CBG: NA  Exercise vitals: . Highest heartrate:  81 . Lowest oxygen saturation: 94 . Highest blood pressure: 128/66 . Liters of 02: RA  Post-exercise vitals: . SpO2: 93 . HR: 76 . BP: 132/60 . Liters of O2: RA . CBG: NA Dr. Brand Males, Medical Director Dr. Coralyn Pear is immediately available during today's Pulmonary Rehab session for Victoria [name] on 11/21/2014  at 1030 class time.  [name]

## 2014-11-25 NOTE — Telephone Encounter (Signed)
Call to Victoria [name] regarding mammogram she was going to schedule in June, but states she just "forgot" and will get this done. States she is making gains in pulmonary rehab; Is slowing working on diet goals; rehab is following up on exercise and diet. The patient is doing well

## 2014-11-26 ENCOUNTER — Encounter (HOSPITAL_COMMUNITY): Payer: Medicare HMO

## 2014-11-28 ENCOUNTER — Encounter (HOSPITAL_COMMUNITY): Payer: Medicare HMO

## 2014-12-03 ENCOUNTER — Encounter (HOSPITAL_COMMUNITY): Payer: Medicare HMO

## 2014-12-03 ENCOUNTER — Encounter (HOSPITAL_COMMUNITY)
Admission: RE | Admit: 2014-12-03 | Discharge: 2014-12-03 | Disposition: A | Discharge status: Home / Self Care | Payer: Medicare HMO | Source: Ambulatory Visit | Attending: Internal Medicine | Admitting: Internal Medicine

## 2014-12-03 DIAGNOSIS — J439 Emphysema, unspecified: Secondary | ICD-10-CM | POA: Diagnosis not present

## 2014-12-03 NOTE — Progress Notes (Signed)
Today, Cheyan exercised at [facility]. Service time was from 10:30am to 12:10pm.  The patient exercised by performing aerobic, strengthening, and stretching exercises. Oxygen saturation, heart rate, blood pressure, rate of perceived exertion, and shortness of breath were all monitored before, during, and after exercise. Diva presented with no problems at today's exercise session.  The patient did have an increase in workload intensity during today's exercise session.  Pre-exercise vitals: . Weight kg: 100.6 . Liters of O2: ra . SpO2: 92 . HR: 62 . BP: 146/66 . CBG: na  Exercise vitals: . Highest heartrate:  88 . Lowest oxygen saturation: 87 . Highest blood pressure: 162/80 . Liters of 02: ra  Post-exercise vitals: . SpO2: 94 . HR: 78 . BP: 132/70 . Liters of O2: ra . CBG: na  Dr. Brand Males, Medical Director Dr. Dyann Kief is immediately available during today's Pulmonary Rehab session for [name] on 12/03/14 at 10:30am class time.

## 2014-12-05 ENCOUNTER — Encounter (HOSPITAL_COMMUNITY): Payer: Medicare HMO

## 2014-12-05 ENCOUNTER — Encounter (HOSPITAL_COMMUNITY)
Admission: RE | Admit: 2014-12-05 | Discharge: 2014-12-05 | Disposition: A | Discharge status: Home / Self Care | Payer: Medicare HMO | Source: Ambulatory Visit | Attending: Internal Medicine | Admitting: Internal Medicine

## 2014-12-05 DIAGNOSIS — J439 Emphysema, unspecified: Secondary | ICD-10-CM | POA: Diagnosis not present

## 2014-12-05 NOTE — Progress Notes (Signed)
Today, Victoria [name] exercised at [facility]. Service time was from 1030 to 1230.  The patient exercised by performing aerobic, strengthening, and stretching exercises. Oxygen saturation, heart rate, blood pressure, rate of perceived exertion, and shortness of breath were all monitored before, during, and after exercise. Victoria [name] presented with no problems at today's exercise session.  Patient attended the Anatomy & Physiology class today.    The patient did not have an increase in workload intensity during today's exercise session.  Pre-exercise vitals: . Weight kg: 101.0 . Liters of O2: RA . SpO2: 93 . HR: 71 . BP: 124/64 . CBG: na  Exercise vitals: . Highest heartrate:  72 . Lowest oxygen saturation: 94 . Highest blood pressure: 122/70 . Liters of 02: RA  Post-exercise vitals: . SpO2: 94 . HR: 80 . BP: 108/56 . Liters of O2: RA . CBG: na Dr. Brand Males, Medical Director Dr. Coralyn Pear is immediately available during today's Pulmonary Rehab session for Victoria [name] on 12/05/2014  at 1030 class time.  [name]

## 2014-12-10 ENCOUNTER — Encounter (HOSPITAL_COMMUNITY): Payer: Medicare HMO

## 2014-12-10 ENCOUNTER — Encounter (HOSPITAL_COMMUNITY)
Admission: RE | Admit: 2014-12-10 | Discharge: 2014-12-10 | Disposition: A | Discharge status: Home / Self Care | Payer: Medicare HMO | Source: Ambulatory Visit | Attending: Internal Medicine | Admitting: Internal Medicine

## 2014-12-10 DIAGNOSIS — J439 Emphysema, unspecified: Secondary | ICD-10-CM | POA: Diagnosis not present

## 2014-12-10 NOTE — Progress Notes (Signed)
Today, Victoria [name] exercised at [facility]. Service time was from 1030 to 1230.  The patient exercised by performing aerobic, strengthening, and stretching exercises. Oxygen saturation, heart rate, blood pressure, rate of perceived exertion, and shortness of breath were all monitored before, during, and after exercise. Victoria [name] presented with no problems at today's exercise session. Reviewed pursed lip and diaphragmatic breathing with Victoria [name] today.  The patient did not have an increase in workload intensity during today's exercise session.  Pre-exercise vitals: . Weight kg: 100.8 . Liters of O2: RA . SpO2: 95 . HR: 71 . BP: 128/60 . CBG: NA  Exercise vitals: . Highest heartrate:  80 . Lowest oxygen saturation: 93 . Highest blood pressure: 130/60 . Liters of 02: RA  Post-exercise vitals: . SpO2: 91 . HR: 70 . BP: 126/60 . Liters of O2: RA . CBG: NA Dr. Brand Males, Medical Director Dr. Coralyn Pear is immediately available during today's Pulmonary Rehab session for Victoria [name] on 12/10/2014  at 1030 class time.  [name]

## 2014-12-10 NOTE — Progress Notes (Signed)
I have reviewed a Home Exercise Prescription with Victoria [name] . Victoria [name] is not currently exercising at home.  The patient was advised to walk 2-3 days a week for 30 minutes.  Victoria [name] and I discussed how to progress their exercise prescription.  The patient stated that their goals were to lose weight (75 pounds long term & 40 pounds short term), be less short of breath, and sleep better at night.  The patient stated that they understand the exercise prescription.  We reviewed exercise guidelines, target heart rate during exercise, oxygen use, weather, home pulse oximeter, endpoints for exercise, and goals.  Patient is encouraged to come to me with any questions. I will continue to follow up with the patient to assist them with progression and safety.

## 2014-12-11 ENCOUNTER — Telehealth: Payer: Self-pay | Admitting: Internal Medicine

## 2014-12-11 DIAGNOSIS — I10 Essential (primary) hypertension: Secondary | ICD-10-CM

## 2014-12-11 MED ORDER — CARVEDILOL 3.125 MG PO TABS: 3.1250 mg | ORAL_TABLET | Freq: Two times a day (BID) | ORAL | Status: DC

## 2014-12-11 NOTE — Telephone Encounter (Signed)
Spoke with patient and after research, medication was clarified as carvedilol. I advised that rx would be sent in to her pharmacy.

## 2014-12-11 NOTE — Telephone Encounter (Signed)
Patient states she is out of a medication that she take in addition to her blood pressure medication for bp and its not hydrochlorothiazide.  She states it was first prescribed about 3 months ago and that she is to take two pills a day.  Patient states there was no refills on this script.  Patient states she does not know the name of this medication and does not have the bottle.  She states this is a generic and starts with a "D".  She is requesting this script to be sent to [facility] on [name].  Patient is requesting follow up call.

## 2014-12-12 ENCOUNTER — Encounter (HOSPITAL_COMMUNITY)
Admission: RE | Admit: 2014-12-12 | Discharge: 2014-12-12 | Disposition: A | Discharge status: Home / Self Care | Payer: Medicare HMO | Source: Ambulatory Visit | Attending: Internal Medicine | Admitting: Internal Medicine

## 2014-12-12 ENCOUNTER — Encounter (HOSPITAL_COMMUNITY): Payer: Medicare HMO

## 2014-12-12 DIAGNOSIS — J439 Emphysema, unspecified: Secondary | ICD-10-CM | POA: Diagnosis not present

## 2014-12-12 NOTE — Progress Notes (Signed)
Today, Victoria [name] exercised at [facility]. Service time was from 1115 to 1315.  The patient exercised by performing aerobic, strengthening, and stretching exercises. Oxygen saturation, heart rate, blood pressure, rate of perceived exertion, and shortness of breath were all monitored before, during, and after exercise. [name] presented with no problems at today's exercise session. Patient attended The MD Day, which is a question and answer session for the participants in pulmonary rehab.     The patient did  have an increase in workload intensity during today's exercise session.  Pre-exercise vitals: . Weight kg: 100.6 . Liters of O2: RA . SpO2: 91 . HR: 86 . BP: 120/64 . CBG: na  Exercise vitals: . Highest heartrate:  84 . Lowest oxygen saturation: 93 . Highest blood pressure: 152/62 . Liters of 02: RA  Post-exercise vitals: . SpO2: 93 . HR: 73 . BP: 120/60 . Liters of O2: RA . CBG: na Dr. Brand Males, Medical Director Dr. Dyann Kief is immediately available during today's Pulmonary Rehab session for Victoria [name] on 12/12/2014  at 1030 class time.  [name]

## 2014-12-17 ENCOUNTER — Encounter (HOSPITAL_COMMUNITY)
Admission: RE | Admit: 2014-12-17 | Discharge: 2014-12-17 | Disposition: A | Discharge status: Home / Self Care | Payer: Medicare HMO | Source: Ambulatory Visit | Attending: Internal Medicine | Admitting: Internal Medicine

## 2014-12-17 ENCOUNTER — Encounter (HOSPITAL_COMMUNITY): Payer: Medicare HMO

## 2014-12-17 DIAGNOSIS — J439 Emphysema, unspecified: Secondary | ICD-10-CM | POA: Diagnosis not present

## 2014-12-17 NOTE — Progress Notes (Signed)
Today, Donnie exercised at [facility]. Service time was from 10:30am to 12:10pm.  The patient exercised by performing aerobic, strengthening, and stretching exercises. Oxygen saturation, heart rate, blood pressure, rate of perceived exertion, and shortness of breath were all monitored before, during, and after exercise. Makenly presented with no problems at today's exercise session.  The patient did not have an increase in workload intensity during today's exercise session.  Pre-exercise vitals: . Weight kg: 100.7 . Liters of O2: ra . SpO2: 93 . HR: 84 . BP: 118/60 . CBG: na  Exercise vitals: . Highest heartrate:  84 . Lowest oxygen saturation: 91 . Highest blood pressure: 144/64 . Liters of 02: ra  Post-exercise vitals: . SpO2: 94 . HR: 76 . BP: 104/64 . Liters of O2: ra . CBG: na  Dr. Brand Males, Medical Director Dr. Dyann Kief is immediately available during today's Pulmonary Rehab session for [name] on 12/17/14 at 10:30am class time.

## 2014-12-19 ENCOUNTER — Encounter (HOSPITAL_COMMUNITY): Payer: Medicare HMO

## 2014-12-24 ENCOUNTER — Encounter (HOSPITAL_COMMUNITY)
Admission: RE | Admit: 2014-12-24 | Discharge: 2014-12-24 | Disposition: A | Discharge status: Home / Self Care | Payer: Medicare HMO | Source: Ambulatory Visit | Attending: Internal Medicine | Admitting: Internal Medicine

## 2014-12-24 ENCOUNTER — Encounter (HOSPITAL_COMMUNITY): Payer: Medicare HMO

## 2014-12-24 DIAGNOSIS — J439 Emphysema, unspecified: Secondary | ICD-10-CM | POA: Diagnosis not present

## 2014-12-24 NOTE — Progress Notes (Signed)
Today, Victoria [name] exercised at [facility]. Service time was from 10:30am to 12:10pm.  The patient exercised by performing aerobic, strengthening, and stretching exercises. Oxygen saturation, heart rate, blood pressure, rate of perceived exertion, and shortness of breath were all monitored before, during, and after exercise. [name] presented with no problems at today's exercise session.  The patient did not have an increase in workload intensity during today's exercise session.  Pre-exercise vitals: . Weight kg: 101.9 . Liters of O2: ra . SpO2: 95 . HR: 79 . BP: 120/60 . CBG: na  Exercise vitals: . Highest heartrate:  88 . Lowest oxygen saturation: 93 . Highest blood pressure: 140/60 . Liters of 02: ra  Post-exercise vitals: . SpO2: 93 . HR: 75 . BP: 118/60 . Liters of O2: ra . CBG: na  Dr. Brand Males, Medical Director Dr. Frederic Jericho is immediately available during today's Pulmonary Rehab session for Victoria [name] on 12/24/14 at 10:30am class time.

## 2014-12-26 ENCOUNTER — Encounter (HOSPITAL_COMMUNITY)
Admission: RE | Admit: 2014-12-26 | Discharge: 2014-12-26 | Disposition: A | Discharge status: Home / Self Care | Payer: Medicare HMO | Source: Ambulatory Visit | Attending: Internal Medicine | Admitting: Internal Medicine

## 2014-12-26 ENCOUNTER — Encounter (HOSPITAL_COMMUNITY): Payer: Medicare HMO

## 2014-12-26 DIAGNOSIS — J439 Emphysema, unspecified: Secondary | ICD-10-CM | POA: Diagnosis not present

## 2014-12-26 NOTE — Progress Notes (Signed)
Today, Margarita exercised at [facility]. Service time was from 1030 to 1215.  The patient exercised by performing aerobic, strengthening, and stretching exercises. Oxygen saturation, heart rate, blood pressure, rate of perceived exertion, and shortness of breath were all monitored before, during, and after exercise. Jazalyn presented with no problems at today's exercise session.  Patient watched the Lung Disease Video today.  The patient did not have an increase in workload intensity during today's exercise session.  Pre-exercise vitals: . Weight kg: 72.3 . Liters of O2: RA . SpO2: 91 . HR: 76 . BP: 140/74 . CBG: na  Exercise vitals: . Highest heartrate:  85 . Lowest oxygen saturation: 94 . Highest blood pressure: 134/70 . Liters of 02: RA  Post-exercise vitals: . SpO2: 94 . HR: 72 . BP: 128/60 . Liters of O2: RA . CBG: na Dr. Brand Males, Medical Director Dr. Sloan Leiter is immediately available during today's Pulmonary Rehab session for [name] on 12/26/2014  at 1030 class time.  [name]

## 2014-12-31 ENCOUNTER — Encounter (HOSPITAL_COMMUNITY)
Admission: RE | Admit: 2014-12-31 | Payer: Medicare HMO | Source: Ambulatory Visit | Attending: Internal Medicine | Admitting: Internal Medicine

## 2014-12-31 ENCOUNTER — Encounter (HOSPITAL_COMMUNITY): Payer: Medicare HMO

## 2015-01-02 ENCOUNTER — Encounter (HOSPITAL_COMMUNITY)
Admission: RE | Admit: 2015-01-02 | Discharge: 2015-01-02 | Disposition: A | Discharge status: Home / Self Care | Payer: Medicare HMO | Source: Ambulatory Visit | Attending: Internal Medicine | Admitting: Internal Medicine

## 2015-01-02 ENCOUNTER — Encounter (HOSPITAL_COMMUNITY): Payer: Medicare HMO

## 2015-01-02 DIAGNOSIS — J439 Emphysema, unspecified: Secondary | ICD-10-CM | POA: Diagnosis not present

## 2015-01-02 NOTE — Progress Notes (Signed)
Today, Victoria [name] exercised at [facility]. Service time was from 10:30am to 12:45pm.  The patient exercised by performing aerobic, strengthening, and stretching exercises. Oxygen saturation, heart rate, blood pressure, rate of perceived exertion, and shortness of breath were all monitored before, during, and after exercise. Victoria [name] presented with no problems at today's exercise session. The patient attended education today with Derek Mound the Nutritionist.  The patient did not have an increase in workload intensity during today's exercise session.  Pre-exercise vitals: . Weight kg: 100.9 . Liters of O2: ra . SpO2: 91 . HR: 82 . BP: 110/50 . CBG: na  Exercise vitals: . Highest heartrate:  82 . Lowest oxygen saturation: 94 . Highest blood pressure: 144/60 . Liters of 02: ra  Post-exercise vitals: . SpO2: 94 . HR: 71 . BP: 128/70 . Liters of O2: ra . CBG: na  Dr. Brand Males, Medical Director Dr. Sloan Leiter is immediately available during today's Pulmonary Rehab session for Victoria [name] on 01/02/15 at 10:30am class time.

## 2015-01-07 ENCOUNTER — Encounter (HOSPITAL_COMMUNITY)
Admission: RE | Admit: 2015-01-07 | Discharge: 2015-01-07 | Disposition: A | Discharge status: Home / Self Care | Payer: Medicare HMO | Source: Ambulatory Visit | Attending: Internal Medicine | Admitting: Internal Medicine

## 2015-01-07 ENCOUNTER — Encounter (HOSPITAL_COMMUNITY): Payer: Medicare HMO

## 2015-01-07 DIAGNOSIS — J439 Emphysema, unspecified: Secondary | ICD-10-CM | POA: Diagnosis not present

## 2015-01-07 NOTE — Progress Notes (Signed)
Today, Victoria [name] exercised at [facility]. Service time was from 10:30am to 12:15pm.  The patient exercised by performing aerobic, strengthening, and stretching exercises. Oxygen saturation, heart rate, blood pressure, rate of perceived exertion, and shortness of breath were all monitored before, during, and after exercise. Victoria [name] presented with no problems at today's exercise session.  The patient did have an increase in workload intensity during today's exercise session.  Pre-exercise vitals: . Weight kg: 101.2 . Liters of O2: ra . SpO2: 92 . HR: 73 . BP: 130/66 . CBG: na  Exercise vitals: . Highest heartrate:  98 . Lowest oxygen saturation: 92 . Highest blood pressure: 160/90 . Liters of 02: ra  Post-exercise vitals: . SpO2: 94 . HR: 80 . BP: 116/66 . Liters of O2: ra . CBG: na  Dr. Brand Males, Medical Director Dr. Candiss Norse is immediately available during today's Pulmonary Rehab session for Victoria [name] on 01/07/15 at 10:30am class time.

## 2015-01-09 ENCOUNTER — Encounter (HOSPITAL_COMMUNITY): Payer: Medicare HMO

## 2015-01-12 ENCOUNTER — Ambulatory Visit (HOSPITAL_BASED_OUTPATIENT_CLINIC_OR_DEPARTMENT_OTHER): Payer: Medicare HMO | Attending: Pulmonary Disease

## 2015-01-12 VITALS — Ht 65.0 in | Wt 223.0 lb

## 2015-01-12 DIAGNOSIS — I493 Ventricular premature depolarization: Secondary | ICD-10-CM | POA: Diagnosis not present

## 2015-01-12 DIAGNOSIS — G4733 Obstructive sleep apnea (adult) (pediatric): Secondary | ICD-10-CM | POA: Diagnosis not present

## 2015-01-12 DIAGNOSIS — R0683 Snoring: Secondary | ICD-10-CM | POA: Diagnosis not present

## 2015-01-12 DIAGNOSIS — G4736 Sleep related hypoventilation in conditions classified elsewhere: Secondary | ICD-10-CM | POA: Insufficient documentation

## 2015-01-12 DIAGNOSIS — G4719 Other hypersomnia: Secondary | ICD-10-CM | POA: Diagnosis present

## 2015-01-14 ENCOUNTER — Encounter (HOSPITAL_COMMUNITY)
Admission: RE | Admit: 2015-01-14 | Discharge: 2015-01-14 | Disposition: A | Discharge status: Home / Self Care | Payer: Medicare HMO | Source: Ambulatory Visit | Attending: Internal Medicine | Admitting: Internal Medicine

## 2015-01-14 ENCOUNTER — Encounter (HOSPITAL_COMMUNITY): Payer: Medicare HMO

## 2015-01-14 DIAGNOSIS — J439 Emphysema, unspecified: Secondary | ICD-10-CM | POA: Diagnosis not present

## 2015-01-14 NOTE — Progress Notes (Signed)
Today, Victoria [name] exercised at [employer]. Service time was from 1030 to 1210.  The patient exercised by performing aerobic, strengthening, and stretching exercises. Oxygen saturation, heart rate, blood pressure, rate of perceived exertion, and shortness of breath were all monitored before, during, and after exercise. Victoria [name] presented with no problems at today's exercise session.  The patient did not have an increase in workload intensity during today's exercise session.  Pre-exercise vitals: . Weight kg: 100.9 . Liters of O2: RA . SpO2: 93 . HR: 75 . BP: 132/68 . CBG: na  Exercise vitals: . Highest heartrate:  87 . Lowest oxygen saturation: 89 . Highest blood pressure: 140/62 . Liters of 02: RA  Post-exercise vitals: . SpO2: 93 . HR: 73 . BP: 124/62 . Liters of O2: RA . CBG: na Dr. Brand Males, Medical Director Dr. Candiss Norse is immediately available during today's Pulmonary Rehab session for Victoria [name] on 01/14/2015  at 1030 class time.  [name]

## 2015-01-15 ENCOUNTER — Telehealth: Payer: Self-pay | Admitting: Pulmonary Disease

## 2015-01-15 DIAGNOSIS — G4733 Obstructive sleep apnea (adult) (pediatric): Secondary | ICD-10-CM

## 2015-01-15 NOTE — Telephone Encounter (Signed)
PSG showed severe OSA esp during supine & REM sleep -AHI 35/h CPAP titration study

## 2015-01-15 NOTE — Progress Notes (Signed)
Patient Name: Victoria [name], Puzzo Date: 01/12/2015 Gender: Female D.O.B: [birth year] Age (years): 77 Referring Provider: Kara Mead MD, ABSM Height (inches): 65 Interpreting Physician: Kara Mead MD, ABSM Weight (lbs): 223 RPSGT: Jonna Coup BMI: 37 MRN: [id number] Neck Size: 16.50  CLINICAL INFORMATION Sleep Study Type: NPSG  Indication for sleep study: excessive daytime somnolence & loud snoring, COPD on home O2  SLEEP STUDY TECHNIQUE As per the AASM Manual for the Scoring of Sleep and Associated Events v2.3 (April 2016) with a hypopnea requiring 4% desaturations.  The channels recorded and monitored were frontal, central and occipital EEG, electrooculogram (EOG), submentalis EMG (chin), nasal and oral airflow, thoracic and abdominal wall motion, anterior tibialis EMG, snore microphone, electrocardiogram, and pulse oximetry.  MEDICATIONS Medications self-administered by patient during sleep study : No sleep medicine administered.  SLEEP ARCHITECTURE The study was initiated at 10:49:18 PM and ended at 5:01:11 AM.  Sleep onset time was 13.1 minutes and the sleep efficiency was 67.9%. The total sleep time was 252.5 minutes.  Stage REM latency was 189.0 minutes.  The patient spent 3.76% of the night in stage N1 sleep, 80.99% in stage N2 sleep, 0.00% in stage N3 and 15.25% in REM.  Alpha intrusion was absent.  Supine sleep was 67.28%.  RESPIRATORY PARAMETERS The overall apnea/hypopnea index (AHI) was 35.6 per hour. There were 12 total apneas, including 12 obstructive, 0 central and 0 mixed apneas. There were 138 hypopneas and 12 RERAs.  The AHI during Stage REM sleep was 85.7 per hour.  AHI while supine was 49.1 per hour.  The mean oxygen saturation was 90.90%. The minimum SpO2 during sleep was 79.00%.  Moderate snoring was noted during this study.  CARDIAC DATA The 2 lead EKG demonstrated sinus rhythm. The mean heart rate was 63.36 beats per minute. Other EKG  findings include: PVCs.  LEG MOVEMENT DATA The total PLMS were 4 with a resulting PLMS index of 0.95. Associated arousal with leg movement index was 0.0 .  IMPRESSIONS Severe obstructive sleep apnea occurred during this study (AHI = 35.6/h). Events were noted during supine & REM sleep. No significant central sleep apnea occurred during this study (CAI = 0.0/h). Moderate oxygen desaturation was noted during this study (Min O2 = 79.00%). The patient snored with Moderate snoring volume. EKG findings include PVCs. Clinically significant periodic limb movements did not occur during sleep. No significant associated arousals.   DIAGNOSIS Obstructive Sleep Apnea (327.23 [G47.33 ICD-10]) Nocturnal Hypoxemia (327.26 [G47.36 ICD-10])   RECOMMENDATIONS Therapeutic CPAP titration to determine optimal pressure required to alleviate sleep disordered breathing. Positional therapy avoiding supine position during sleep. Avoid alcohol, sedatives and other CNS depressants that may worsen sleep apnea and disrupt normal sleep architecture. Sleep hygiene should be reviewed to assess factors that may improve sleep quality. Weight management and regular exercise should be initiated or continued if appropriate.  Kara Mead MD. [facility]. [facility]

## 2015-01-15 NOTE — Addendum Note (Signed)
Addended by: Rigoberto Noel on: 01/15/2015 05:55 PM   Modules accepted: Level of Service

## 2015-01-16 ENCOUNTER — Encounter (HOSPITAL_COMMUNITY)
Admission: RE | Admit: 2015-01-16 | Discharge: 2015-01-16 | Disposition: A | Discharge status: Home / Self Care | Payer: Medicare HMO | Source: Ambulatory Visit | Attending: Internal Medicine | Admitting: Internal Medicine

## 2015-01-16 ENCOUNTER — Encounter (HOSPITAL_COMMUNITY): Payer: Medicare HMO

## 2015-01-16 DIAGNOSIS — J439 Emphysema, unspecified: Secondary | ICD-10-CM | POA: Diagnosis not present

## 2015-01-16 NOTE — Telephone Encounter (Signed)
CPAP Titration order entered. Called and left message for patient to call back Awaiting call back from patient.

## 2015-01-16 NOTE — Progress Notes (Signed)
Today, Victoria [name] exercised at [employer]. [facility]. Service time was from 10:30am to 12:15pm.  The patient exercised by performing aerobic, strengthening, and stretching exercises. Oxygen saturation, heart rate, blood pressure, rate of perceived exertion, and shortness of breath were all monitored before, during, and after exercise. Victoria [name] presented with no problems at today's exercise session. The patient attended education today with Respiratory Therapy on Pursed Lip and Diaphragmatic Breathing.  The patient did not have an increase in workload intensity during today's exercise session.  Pre-exercise vitals: . Weight kg: 101.1 . Liters of O2: a . SpO2: 95 . HR: 72 . BP: 140/70 . CBG: na  Exercise vitals: . Highest heartrate:  87 . Lowest oxygen saturation: 91 . Highest blood pressure: 130/70 . Liters of 02: ra  Post-exercise vitals: . SpO2: 93 . HR: 68 . BP: 120/60 . Liters of O2: ra . CBG: na  Dr. Brand Males, Medical Director Dr. Carles Collet is immediately available during today's Pulmonary Rehab session for [name] on 01/16/15 at 10:30am class time.

## 2015-01-19 ENCOUNTER — Other Ambulatory Visit: Payer: Self-pay | Admitting: Internal Medicine

## 2015-01-21 ENCOUNTER — Encounter (HOSPITAL_COMMUNITY)
Admission: RE | Admit: 2015-01-21 | Discharge: 2015-01-21 | Disposition: A | Discharge status: Home / Self Care | Payer: Medicare HMO | Source: Ambulatory Visit | Attending: Internal Medicine | Admitting: Internal Medicine

## 2015-01-21 ENCOUNTER — Encounter (HOSPITAL_COMMUNITY): Payer: Medicare HMO

## 2015-01-21 DIAGNOSIS — J439 Emphysema, unspecified: Secondary | ICD-10-CM | POA: Diagnosis not present

## 2015-01-21 NOTE — Progress Notes (Signed)
Today, Victoria [name] exercised at [employer]. Service time was from 1030 to 1220.  The patient exercised by performing aerobic, strengthening, and stretching exercises. Oxygen saturation, heart rate, blood pressure, rate of perceived exertion, and shortness of breath were all monitored before, during, and after exercise. [name] presented with no problems at today's exercise session.  The patient did not have an increase in workload intensity during today's exercise session.  Pre-exercise vitals: . Weight kg: 100.5 . Liters of O2: RA . SpO2: 93 . HR: 77 . BP: 142/60 . CBG: NA  Exercise vitals: . Highest heartrate:  88 . Lowest oxygen saturation: 92 . Highest blood pressure: 134/62 . Liters of 02: RA  Post-exercise vitals: . SpO2: 92 . HR: 72 . BP: 130/72 . Liters of O2: RA . CBG: NA Dr. Brand Males, Medical Director Dr. Carles Collet is immediately available during today's Pulmonary Rehab session for Victoria [name] on 01/21/2015  at 1030 class time.  [name]

## 2015-01-23 ENCOUNTER — Ambulatory Visit (HOSPITAL_BASED_OUTPATIENT_CLINIC_OR_DEPARTMENT_OTHER): Payer: Medicare HMO | Attending: Pulmonary Disease

## 2015-01-23 ENCOUNTER — Encounter (HOSPITAL_COMMUNITY): Payer: Medicare HMO

## 2015-01-23 ENCOUNTER — Encounter (HOSPITAL_COMMUNITY)
Admission: RE | Admit: 2015-01-23 | Discharge: 2015-01-23 | Disposition: A | Discharge status: Home / Self Care | Payer: Medicare HMO | Source: Ambulatory Visit | Attending: Internal Medicine | Admitting: Internal Medicine

## 2015-01-23 VITALS — Ht 66.0 in | Wt 223.0 lb

## 2015-01-23 DIAGNOSIS — G4730 Sleep apnea, unspecified: Secondary | ICD-10-CM | POA: Diagnosis present

## 2015-01-23 DIAGNOSIS — G4733 Obstructive sleep apnea (adult) (pediatric): Secondary | ICD-10-CM | POA: Insufficient documentation

## 2015-01-23 NOTE — Telephone Encounter (Signed)
Patient notified of appointment and confirmed that she will do study tonight. Nothing further needed.

## 2015-01-23 NOTE — Progress Notes (Signed)
Today, Zanyiah exercised at [employer]. [facility]. Service time was from 1030 to 1235.  The patient exercised by performing aerobic, strengthening, and stretching exercises. Oxygen saturation, heart rate, blood pressure, rate of perceived exertion, and shortness of breath were all monitored before, during, and after exercise. Josalyn presented with no problems at today's exercise session.  Patient attended the [place] class today.  The patient did not have an increase in workload intensity during today's exercise session.  Pre-exercise vitals: . Weight kg: 100.5 . Liters of O2: RA . SpO2: 92 . HR: 68 . BP: 130/70 . CBG: na  Exercise vitals: . Highest heartrate:  83 . Lowest oxygen saturation: 93 . Highest blood pressure: 142/70 . Liters of 02: RA  Post-exercise vitals: . SpO2: 93 . HR: 71 . BP: 112/60 . Liters of O2: RA . CBG: na Dr. Brand Males, Medical Director Dr. Candiss Norse is immediately available during today's Pulmonary Rehab session for [name] on 01/23/2015  at 1030 class time.  [name]

## 2015-01-23 NOTE — Telephone Encounter (Signed)
Pt is scheduled for CPAP titration on 01/23/15 @ 8pm LVM for pt to return call to confirm

## 2015-01-24 ENCOUNTER — Telehealth: Payer: Self-pay | Admitting: Internal Medicine

## 2015-01-24 DIAGNOSIS — J449 Chronic obstructive pulmonary disease, unspecified: Secondary | ICD-10-CM

## 2015-01-24 NOTE — Telephone Encounter (Signed)
Could try duoneb 4 times daily

## 2015-01-24 NOTE — Telephone Encounter (Signed)
Spoke with pt. She reports she can't afford the anoro bc it is now $50/mo. We currently do not have any samples.  She reports pulm rehab had advised her to ask about changing her inhalers to nebulizer's. Please advise MR thanks

## 2015-01-24 NOTE — Telephone Encounter (Signed)
lmtcb x1 

## 2015-01-27 MED ORDER — IPRATROPIUM-ALBUTEROL 0.5-2.5 (3) MG/3ML IN SOLN: RESPIRATORY_TRACT | Status: DC

## 2015-01-27 NOTE — Telephone Encounter (Signed)
Patient notified. RX sent to [place] for [facility]. Nothing further needed.

## 2015-01-27 NOTE — Addendum Note (Signed)
Addended by: Mathis Dad on: 01/27/2015 11:15 AM   Modules accepted: Orders

## 2015-01-27 NOTE — Telephone Encounter (Signed)
TE closed ------------------------------- Rx not signed, will have to wait until Dr. Chase Caller returns to office Sample of Anoro left at front for patient Patient called and notified that order will be sent next week when Dr. Chase Caller returns  FYI to Dr. Chase Caller, Ola Spurr given to [facility] for Dr. Chase Caller to sign and given to [facility] once signed

## 2015-01-28 ENCOUNTER — Encounter (HOSPITAL_COMMUNITY): Payer: Medicare HMO

## 2015-01-28 ENCOUNTER — Encounter (HOSPITAL_COMMUNITY)
Admission: RE | Admit: 2015-01-28 | Discharge: 2015-01-28 | Disposition: A | Discharge status: Home / Self Care | Payer: Medicare HMO | Source: Ambulatory Visit | Attending: Internal Medicine | Admitting: Internal Medicine

## 2015-01-28 DIAGNOSIS — J439 Emphysema, unspecified: Secondary | ICD-10-CM | POA: Diagnosis not present

## 2015-01-28 NOTE — Progress Notes (Signed)
Victoria [name] completed a Six-Minute Walk Test on 01/28/15 . Victoria [name] walked 856 feet with 0 breaks.  The patient's lowest oxygen saturation was 89 %, highest heart rate was 98 bpm , and highest blood pressure was 158/80. The patient was on room air. Patient stated that nothing hindered their walk test.

## 2015-02-04 ENCOUNTER — Telehealth: Payer: Self-pay | Admitting: Pulmonary Disease

## 2015-02-04 DIAGNOSIS — G4733 Obstructive sleep apnea (adult) (pediatric): Secondary | ICD-10-CM | POA: Diagnosis not present

## 2015-02-04 NOTE — Addendum Note (Signed)
Addended by: Rigoberto Noel on: 02/04/2015 08:41 AM   Modules accepted: Level of Service

## 2015-02-04 NOTE — Progress Notes (Signed)
Patient Name: Bayley, [place] Date: 01/23/2015 Gender: Female D.O.B: 05-05-1946 Age (years): 31 Referring Provider: Kara Mead MD, ABSM Height (inches): 65 Interpreting Physician: Kara Mead MD, ABSM Weight (lbs): 223 RPSGT: Baxter Flattery BMI: 33 MRN: [id number] Neck Size: 16.50  CLINICAL INFORMATION The patient with moderate COPD on nocturnal O2 is referred for a CPAP titration to treat sleep apnea.  Date of NPSG: 12/2014  - AHI 35/h  SLEEP STUDY TECHNIQUE As per the AASM Manual for the Scoring of Sleep and Associated Events v2.3 (April 2016) with a hypopnea requiring 4% desaturations.  The channels recorded and monitored were frontal, central and occipital EEG, electrooculogram (EOG), submentalis EMG (chin), nasal and oral airflow, thoracic and abdominal wall motion, anterior tibialis EMG, snore microphone, electrocardiogram, and pulse oximetry. Continuous positive airway pressure (CPAP) was initiated at the beginning of the study and titrated to treat sleep-disordered breathing.  MEDICATIONS Medications administered by patient during sleep study : No sleep medicine administered.  TECHNICIAN COMMENTS Comments added by technician: O2 initiated due to low sats. Patient talked in his/her sleep.    RESPIRATORY PARAMETERS Optimal PAP Pressure (cm): 13 AHI at Optimal Pressure (/hr): 5.6 Overall Minimal O2 (%): 80.00 Supine % at Optimal Pressure (%): 100 Minimal O2 at Optimal Pressure (%): 89.0    SLEEP ARCHITECTURE The study was initiated at 10:56:17 PM and ended at 5:08:14 AM.  Sleep onset time was 88.9 minutes and the sleep efficiency was 62.3%. The total sleep time was 231.6 minutes.  The patient spent 2.81% of the night in stage N1 sleep, 53.79% in stage N2 sleep, 24.83% in stage N3 and 18.57% in REM.Stage REM latency was 38.5 minutes  Wake after sleep onset was 51.5. Alpha intrusion was absent. Supine sleep was 82.51%.  CARDIAC DATA The 2 lead EKG demonstrated sinus  rhythm. The mean heart rate was 65.67 beats per minute. Other EKG findings include: None. LEG MOVEMENT DATA The total Periodic Limb Movements of Sleep (PLMS) were 47. The PLMS index was 12.18. A PLMS index of <15 is considered normal in adults.  IMPRESSIONS The optimal PAP pressure was 13 cm of water. Central sleep apnea was not noted during this titration (CAI = 2.1/h). Severe oxygen desaturations were observed during this titration (min O2 = 80.00%). No snoring was audible during this study. No cardiac abnormalities were observed during this study. Mild periodic limb movements were observed during this study. Arousals associated with PLMs were rare.   DIAGNOSIS Obstructive Sleep Apnea (327.23 [G47.33 ICD-10])   RECOMMENDATIONS Trial of CPAP therapy on 13 cm H2O with a Medium size Fisher&Paykel Full Face Mask Simplus mask and heated humidification. Avoid alcohol, sedatives and other CNS depressants that may worsen sleep apnea and disrupt normal sleep architecture. Sleep hygiene should be reviewed to assess factors that may improve sleep quality. Weight management and regular exercise should be initiated or continued. Return for re-evaluation after 4 weeks of therapy  Rigoberto Noel.  MD

## 2015-02-04 NOTE — Telephone Encounter (Signed)
OSA corrected by CPAP 13 cm, no oxygen requirement Send prescription to DME for- CPAP 13 cm, medium fullface mask, humidity, download in 4 weeks - OK to dc O2 Office visit with Tp/ me in 6 wks

## 2015-02-04 NOTE — Telephone Encounter (Signed)
lmtcb x1 

## 2015-02-05 NOTE — Telephone Encounter (Signed)
Rx signed by MR and given to [facility], [facility], to fax to rx and order to [place]. Pt aware. Please see phone note from 9.9.2016 for more info. Will sign off.

## 2015-02-05 NOTE — Telephone Encounter (Signed)
Orders entered to D/C oxygen and set up CPAP Patient notified. Nothing further needed.

## 2015-02-05 NOTE — Telephone Encounter (Addendum)
Rx singed and given to [place], [facility], to fax to [place] along with order. Called and spoke to pt. Informed her of Duoneb rx being faxed. Pt verbalized understanding and requested another Anoro sample to last her till she gets the Duoneb. One Anoro sample left up front for pick up. Pt verbalized understanding and denied any further questions or concerns at this time.

## 2015-02-05 NOTE — Telephone Encounter (Signed)
Spoke with the pt  She is waiting on neb machine and meds  Looks like [facility] was waiting on MR to sign rx  I explained this to the pt  Elise,can you please have him sign rx today while he is here? Thanks

## 2015-02-06 ENCOUNTER — Telehealth: Payer: Self-pay | Admitting: Internal Medicine

## 2015-02-06 ENCOUNTER — Other Ambulatory Visit: Payer: Self-pay | Admitting: Obstetrics and Gynecology

## 2015-02-06 NOTE — Telephone Encounter (Signed)
atc Carol at [facility], line rang several time and went to a fast busy signal.  Wcb.

## 2015-02-06 NOTE — Progress Notes (Signed)
Pulmonary Rehab Discharge Note: Victoria [name] has been discharged from pulmonary rehab after successfully completing 23 exercise/education session. She improved her stamina and strength as evidenced by her ability to walk an additional 81 feet on her discharge walk test as compared to her admission test. She did meet her personal goal of beginning a weight loss regimen and managing her shortness of breath so that she feels better attending church service. Her admission weight was 102.7 kg compared to her discharge weight of 100.5 kg. She is now following a weight watchers diet. She plans to continue to exercise at home and considering joining silver sneakers at the [facility]. It was a pleasure working with Ananias Pilgrim in pulmonary rehab.

## 2015-02-07 ENCOUNTER — Other Ambulatory Visit (HOSPITAL_COMMUNITY): Payer: Self-pay | Admitting: Obstetrics and Gynecology

## 2015-02-07 ENCOUNTER — Ambulatory Visit (HOSPITAL_BASED_OUTPATIENT_CLINIC_OR_DEPARTMENT_OTHER): Payer: Medicare HMO

## 2015-02-07 DIAGNOSIS — E041 Nontoxic single thyroid nodule: Secondary | ICD-10-CM

## 2015-02-07 LAB — CYTOLOGY - PAP

## 2015-02-07 NOTE — Telephone Encounter (Signed)
Called [facility] and spoke with Lynn Ito, she is aware we have re-faxed this Order as Arbie Cookey is out of the office.

## 2015-02-07 NOTE — Telephone Encounter (Signed)
Order and records have been re-faxed to [place].  I will also call Arbie Cookey @[facility]  to verify that they do receive the fax this time.

## 2015-02-07 NOTE — Telephone Encounter (Signed)
Spoke with Victoria [name], states that they need the order for cpap re-faxed over to [name] as they have not received it.    PCC's please advise if you can refax this order.  I'm not sure how to change the order from our end to the complete order that [name] receives.  Thanks!

## 2015-02-11 ENCOUNTER — Ambulatory Visit (HOSPITAL_COMMUNITY)
Admission: RE | Admit: 2015-02-11 | Discharge: 2015-02-11 | Disposition: A | Discharge status: Home / Self Care | Payer: Medicare HMO | Source: Ambulatory Visit | Attending: Obstetrics and Gynecology | Admitting: Obstetrics and Gynecology

## 2015-02-11 DIAGNOSIS — E042 Nontoxic multinodular goiter: Secondary | ICD-10-CM | POA: Diagnosis present

## 2015-02-11 DIAGNOSIS — E010 Iodine-deficiency related diffuse (endemic) goiter: Secondary | ICD-10-CM | POA: Insufficient documentation

## 2015-02-11 DIAGNOSIS — E041 Nontoxic single thyroid nodule: Secondary | ICD-10-CM

## 2015-02-12 ENCOUNTER — Ambulatory Visit (HOSPITAL_COMMUNITY): Payer: Medicare HMO

## 2015-02-17 ENCOUNTER — Other Ambulatory Visit: Payer: Self-pay | Admitting: Obstetrics and Gynecology

## 2015-02-18 ENCOUNTER — Other Ambulatory Visit: Payer: Self-pay | Admitting: Obstetrics and Gynecology

## 2015-02-18 DIAGNOSIS — E041 Nontoxic single thyroid nodule: Secondary | ICD-10-CM

## 2015-02-20 ENCOUNTER — Other Ambulatory Visit (HOSPITAL_COMMUNITY)
Admission: RE | Admit: 2015-02-20 | Discharge: 2015-02-20 | Disposition: A | Discharge status: Home / Self Care | Payer: Medicare HMO | Source: Ambulatory Visit | Attending: Radiology | Admitting: Radiology

## 2015-02-20 ENCOUNTER — Ambulatory Visit
Admission: RE | Admit: 2015-02-20 | Discharge: 2015-02-20 | Disposition: A | Discharge status: Home / Self Care | Payer: Medicare HMO | Source: Ambulatory Visit | Attending: Obstetrics and Gynecology | Admitting: Obstetrics and Gynecology

## 2015-02-20 DIAGNOSIS — E041 Nontoxic single thyroid nodule: Secondary | ICD-10-CM

## 2015-02-20 DIAGNOSIS — E042 Nontoxic multinodular goiter: Secondary | ICD-10-CM | POA: Diagnosis present

## 2015-03-03 ENCOUNTER — Other Ambulatory Visit: Payer: Self-pay

## 2015-03-03 MED ORDER — ALBUTEROL SULFATE HFA 108 (90 BASE) MCG/ACT IN AERS: 1.0000 | INHALATION_SPRAY | Freq: Four times a day (QID) | RESPIRATORY_TRACT | Status: DC | PRN

## 2015-03-18 ENCOUNTER — Ambulatory Visit (INDEPENDENT_AMBULATORY_CARE_PROVIDER_SITE_OTHER): Payer: Medicare HMO | Admitting: Adult Health

## 2015-03-18 ENCOUNTER — Encounter: Payer: Self-pay | Admitting: Adult Health

## 2015-03-18 VITALS — BP 138/72 | HR 64 | Temp 98.3°F | Ht 64.0 in | Wt 226.0 lb

## 2015-03-18 DIAGNOSIS — J449 Chronic obstructive pulmonary disease, unspecified: Secondary | ICD-10-CM

## 2015-03-18 DIAGNOSIS — E669 Obesity, unspecified: Secondary | ICD-10-CM | POA: Diagnosis not present

## 2015-03-18 DIAGNOSIS — G4733 Obstructive sleep apnea (adult) (pediatric): Secondary | ICD-10-CM

## 2015-03-18 MED ORDER — DOXYCYCLINE HYCLATE 100 MG PO TABS: 100.0000 mg | ORAL_TABLET | Freq: Two times a day (BID) | ORAL | Status: DC

## 2015-03-18 NOTE — Assessment & Plan Note (Signed)
Doing well on CPAP  Will try new mask size   Plan  \Great job with CPAP  Wear for at least 6hr each night  Do not drive if sleepy.  Work on weight loss Follow up Dr. Chase Caller in 3 months and As needed

## 2015-03-18 NOTE — Patient Instructions (Addendum)
Doxycycline 100mg  Twice daily  For 7 days .  Mucinex DM Twice daily  As needed  Cough/congesiton  Great job with CPAP  Wear for at least 6hr each night  Do not drive if sleepy.  Work on [employer] loss Continue with pulmonary rehab .  Continue on QVAR and Duoneb Follow up Dr. Chase Caller in 3 months and As needed

## 2015-03-18 NOTE — Assessment & Plan Note (Addendum)
Mild flare   Plan Doxycycline 100mg  Twice daily  For 7 days .  Mucinex DM Twice daily  As needed  Cough/congesiton   Continue with pulmonary rehab . -maintenance   Continue on QVAR and Duoneb Follow up Dr. Chase Caller in 3 months and As needed

## 2015-03-18 NOTE — Assessment & Plan Note (Signed)
Wt loss encouraged  

## 2015-03-18 NOTE — Progress Notes (Signed)
   Subjective:    Patient ID: Victoria [name], female    DOB: 07/11/1945, 69 y.o.   MRN: [id number]  HPI 69 yo female with GOLD 3 COPD and OSA   TEST  PSG 12/2014 >AHI 35/h   03/18/2015 Follow up OSA  Pt returns for a 6 week follow up for severe OSA .  She was recently found to have OSA  Started on CPAP in September with set pressure at 13cm .  Download for last 30 days with excellent compliance with avg usage 5 hr , AHI 0.9 . Minimal leaks.  Mask fits okay but would like to discuss how it is fitting. We discussed different mask and sizes. She would like to try new mask.  We discussed wt loss. Has joined silver sneakers.   Not on ANORO was too expensive . Takes Duoneb Four times a day  , Uses QVAR Twice daily  . This helps.  Gets winded easily. Rest helps her symptoms. 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  Wants to start the maintenance program for pulm rehab.  PVX and Prevnar utd  Needs flu shot today.  CXR 05/2014 w/ chronic changes .  Does complain of increased cough and congestion with yellow over last week. Taking mucinex without much help. No fever or hemoptysis    Review of Systems Constitutional:   No  weight loss, night sweats,  Fevers, chills,  +fatigue, or  lassitude.  HEENT:   No headaches,  Difficulty swallowing,  Tooth/dental problems, or  Sore throat,                No sneezing, itching, ear ache, nasal congestion, post nasal drip,   CV:  No chest pain,  Orthopnea, PND, swelling in lower extremities, anasarca, dizziness, palpitations, syncope.   GI  No heartburn, indigestion, abdominal pain, nausea, vomiting, diarrhea, change in bowel habits, loss of appetite, bloody stools.   Resp:    No chest wall deformity  Skin: no rash or lesions.  GU: no dysuria, change in color of urine, no urgency or frequency.  No flank pain, no hematuria   MS:  No joint pain or swelling.  No decreased range of motion.  No back pain.  Psych:  No change in mood or affect. No  depression or anxiety.  No memory loss.         Objective:   Physical Exam  GEN: A/Ox3; pleasant , NAD, obese   HEENT:  [place]/AT,  EACs-clear, TMs-wnl, NOSE-clear, THROAT-clear, no lesions, no postnasal drip or exudate noted. Class 2-3 MP airway   NECK:  Supple w/ fair ROM; no JVD; normal carotid impulses w/o bruits; no thyromegaly or nodules palpated; no lymphadenopathy.  RESP  Decrease BS , no accessory muscle use, no dullness to percussion  CARD:  RRR, no m/r/g  , tr  peripheral edema, pulses intact, no cyanosis or clubbing.  GI:   Soft & nt; nml bowel sounds; no organomegaly or masses detected.  Musco: Warm bil, no deformities or joint swelling noted.   Neuro: alert, no focal deficits noted.    Skin: Warm, no lesions or rashes        Assessment & Plan:

## 2015-04-04 ENCOUNTER — Encounter (HOSPITAL_BASED_OUTPATIENT_CLINIC_OR_DEPARTMENT_OTHER): Payer: Medicare HMO

## 2015-04-07 ENCOUNTER — Ambulatory Visit (INDEPENDENT_AMBULATORY_CARE_PROVIDER_SITE_OTHER): Payer: Medicare HMO | Admitting: Family Medicine

## 2015-04-07 ENCOUNTER — Encounter: Payer: Self-pay | Admitting: Family Medicine

## 2015-04-07 ENCOUNTER — Ambulatory Visit (INDEPENDENT_AMBULATORY_CARE_PROVIDER_SITE_OTHER): Payer: Medicare HMO

## 2015-04-07 VITALS — BP 146/80 | HR 80 | Temp 98.5°F | Resp 16 | Ht 65.5 in | Wt 228.4 lb

## 2015-04-07 DIAGNOSIS — M5441 Lumbago with sciatica, right side: Secondary | ICD-10-CM | POA: Diagnosis not present

## 2015-04-07 DIAGNOSIS — Z23 Encounter for immunization: Secondary | ICD-10-CM | POA: Diagnosis not present

## 2015-04-07 DIAGNOSIS — I77819 Aortic ectasia, unspecified site: Secondary | ICD-10-CM

## 2015-04-07 MED ORDER — TRAMADOL HCL 50 MG PO TABS: 50.0000 mg | ORAL_TABLET | Freq: Four times a day (QID) | ORAL | Status: DC | PRN

## 2015-04-07 MED ORDER — TIZANIDINE HCL 4 MG PO CAPS: 4.0000 mg | ORAL_CAPSULE | Freq: Three times a day (TID) | ORAL | Status: DC | PRN

## 2015-04-07 MED ORDER — NAPROXEN 500 MG PO TABS: 500.0000 mg | ORAL_TABLET | Freq: Two times a day (BID) | ORAL | Status: DC

## 2015-04-07 NOTE — Progress Notes (Signed)
Subjective:    Patient ID: Victoria [name], female    DOB: 1945/07/01, 69 y.o.   MRN: [id number]  04/07/2015  Back Pain   HPI This 69 y.o. female presents for evaluation of R lower back pain.  Onset three days ago.  No injury or overuse.  Having horrible pain in R lower region; pain stops at R knee.  +spasm in lower back.  When getting up from toilet, knee R hurts.  Getting off toilet is bad. No n/t/burning.  No weakness. Hobbles.  Normal b/b function. No saddle paresthesias.  Took OTC ES Tylenol.    Working 3 days per week.  Also relies on SS.  Paying a mortgage.  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  Also does office and clerical work..     Review of Systems  Constitutional: Negative for fever, chills, diaphoresis and fatigue.  HENT: Negative for ear pain, postnasal drip, rhinorrhea, sinus pressure, sore throat and trouble swallowing.   Respiratory: Negative for cough and shortness of breath.   Cardiovascular: Negative for chest pain, palpitations and leg swelling.  Gastrointestinal: Negative for nausea, vomiting, abdominal pain, diarrhea and constipation.  Genitourinary: Negative for decreased urine volume and difficulty urinating.  Musculoskeletal: Positive for back pain and arthralgias.  Neurological: Negative for weakness and numbness.    Past Medical History  Diagnosis Date  . Asthma     uses advair   . Arthritis     left ankle, right knee, right SI joint, wrists  . Varicella as child  . 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 Hypertension   . Hyperlipidemia   . Tuberculosis 2009    h/o positive PPD- seen at health department, had  normal chest x-ray. she was not treated for a  new conversion.  [name] COPD (chronic obstructive pulmonary disease) ([place])   . Murmur, cardiac 1950   Past Surgical History  Procedure Laterality Date  . Excision of pelvic  absess, right ovary      2008  . Appendectomy      2008  . Cesarean section      1972   Allergies  Allergen Reactions  . Nickel Rash    Severe rash to infection: pt is allergic to all metals other than sterling silver or gold jewelry.   [name] [Olmesartan] Swelling    Swelling of face and arms   . [name] [Valsartan] Swelling    Swelling of face and arms   . Lisinopril Cough  . Codeine Other (See Comments)    jittery  . Monosodium Glutamate Other (See Comments)    Facial swelling per pt    Social History   Social History  . Marital Status: Divorced    Spouse Name: N/A  . Number of Children: 1  . Years of Education: 16   Occupational History  . counselor, [place]    Social History Main Topics  . Smoking status: Former Smoker -- 0.50 packs/day for 40 years    Types: Cigarettes    Quit date: 04/16/2011  . Smokeless tobacco: Former Systems developer    Quit date: 04/16/2011     Comment: quit that date when she had to go to ER   . Alcohol Use: 0.0 oz/week    0 Standard drinks or equivalent per week     Comment: rare occasion  . Drug Use: No  . Sexual Activity: No  Other Topics Concern  . Not on file   Social History Narrative   HSG; [name], [place]. . Married '69 - 9 yrs/divorced. 1 son - '72; no grandchildren.   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 No h/o physical or sexual abuse. ACP - no living will - wants information. Provided packet of information. On 08/05/2011: she stated she was distant cousins to celebrities [name] and [name]         Family History  Problem Relation Age of Onset  . Heart disease Mother     MI - fatal  . Hypertension Mother   . Stroke Father 48    fatal  . Alzheimer's disease Father   . Alzheimer's disease Brother   . Hyperlipidemia Brother   . Hypertension Brother   . Diabetes Brother   . Hypertension Brother   . Hyperlipidemia Brother        Objective:      BP 146/80 mmHg  Pulse 80  Temp(Src) 98.5 F (36.9 C) (Oral)  Resp 16  Ht 5' 5.5" (1.664 m)  Wt 228 lb 6.4 oz (103.602 kg)  BMI 37.42 kg/m2 Physical Exam  Constitutional: She is oriented to person, place, and time. She appears well-developed and well-nourished. No distress.  HENT:  Head: Normocephalic and atraumatic.  Eyes: Conjunctivae are normal. Pupils are equal, round, and reactive to light.  Neck: Normal range of motion. Neck supple.  Cardiovascular: Normal rate, regular rhythm and normal heart sounds.  Exam reveals no gallop and no friction rub.   No murmur heard. Pulmonary/Chest: Effort normal and breath sounds normal. She has no wheezes. She has no rales.  Musculoskeletal:       Lumbar back: She exhibits decreased range of motion, tenderness, pain and spasm. She exhibits no bony tenderness.  Lumbar spine:  Non-tender midline; +tender paraspinal regions R.  Straight leg raises negative B; toe and heel walking intact; marching intact; motor 5/5 BLE.  Decreased ROM lumbar spine with limitation due to pain.    Neurological: She is alert and oriented to person, place, and time.  Skin: She is not diaphoretic.  Psychiatric: She has a normal mood and affect. Her behavior is normal.  Nursing note and vitals reviewed.  Results for orders placed or performed in visit on 02/06/15  Cytology - PAP  Result Value Ref Range   CYTOLOGY - PAP PAP RESULT    [facility] reading (PRIMARY) by  Dr. Tamala Julian. LUMBAR SPINE FILMS: DDD lumbar spine; NAD      Assessment & Plan:   1. Right-sided low back pain with right-sided sciatica   2. Need for prophylactic vaccination and inoculation against influenza    -New. -Rx for Naproxen, Zanaflex, Tramadol. -Recommend rest, stretches, frequent ambulation. -call in 2 weeks if no improvement for ortho referral.   Orders Placed This Encounter  Procedures  . DG Lumbar Spine Complete    Standing Status: Future     Number of Occurrences: 1     Standing  Expiration Date: 04/06/2016    Order Specific Question:  Reason for Exam (SYMPTOM  OR DIAGNOSIS REQUIRED)    Answer:  R lower back pain at SI region; radiation into R thigh and R knee    Order Specific Question:  Preferred imaging location?    Answer:  External  . Flu Vaccine QUAD 36+ mos IM   Meds ordered this encounter  Medications  . tiZANidine (ZANAFLEX) 4 MG capsule    Sig: Take 1  capsule (4 mg total) by mouth 3 (three) times daily as needed for muscle spasms.    Dispense:  30 capsule    Refill:  0  . naproxen (NAPROSYN) 500 MG tablet    Sig: Take 1 tablet (500 mg total) by mouth 2 (two) times daily with a meal.    Dispense:  30 tablet    Refill:  0  . traMADol (ULTRAM) 50 MG tablet    Sig: Take 1 tablet (50 mg total) by mouth every 6 (six) hours as needed.    Dispense:  40 tablet    Refill:  0    No Follow-up on file.    Jasn Xia Elayne Guerin, M.D. [facility] [address] [place], [place]  [address] [phone] phone [phone] fax

## 2015-04-07 NOTE — Patient Instructions (Addendum)
1. CONTINUE TYLENOL EXTRA STRENGTH 2 TABLETS THREE TIMES DAILY FOR PAIN. 2. TAKE NAPROXEN 500MG  ONE TABLET TWICE DAILY FOR ANTI-INFLAMMATORY 3.  TAKE ZANAFLEX 4MG  ONE PILL THREE TIMES DAILY FOR MUSCLE SPASM. 4.  TAKE TRAMADOL 50MG  ONE TABLET EVERY SIX HOURS AS NEEDED FOR PAIN.  Low Back Sprain With Rehab A sprain is an injury in which a ligament is torn. The ligaments of the lower back are vulnerable to sprains. However, they are strong and require great force to be injured. These ligaments are important for stabilizing the spinal column. Sprains are classified into three categories. Grade 1 sprains cause pain, but the tendon is not lengthened. Grade 2 sprains include a lengthened ligament, due to the ligament being stretched or partially ruptured. With grade 2 sprains there is still function, although the function may be decreased. Grade 3 sprains involve a complete tear of the tendon or muscle, and function is usually impaired. SYMPTOMS   Severe pain in the lower back.  Sometimes, a feeling of a "pop," "snap," or tear, at the time of injury.  Tenderness and sometimes swelling at the injury site.  Uncommonly, bruising (contusion) within 48 hours of injury.  Muscle spasms in the back. CAUSES  Low back sprains occur when a force is placed on the ligaments that is greater than they can handle. Common causes of injury include:  Performing a stressful act while off-balance.  Repetitive stressful activities that involve movement of the lower back.  Direct hit (trauma) to the lower back. RISK INCREASES WITH:  Contact sports (football, wrestling).  Collisions (major skiing accidents).  Sports that require throwing or lifting (baseball, weightlifting).  Sports involving twisting of the spine (gymnastics, diving, tennis, golf).  Poor strength and flexibility.  Inadequate protection.  Previous back injury or surgery (especially fusion). PREVENTION  Wear properly fitted and padded  protective equipment.  Warm up and stretch properly before activity.  Allow for adequate recovery between workouts.  Maintain physical fitness:  Strength, flexibility, and endurance.  Cardiovascular fitness.  Maintain a healthy body weight. PROGNOSIS  If treated properly, low back sprains usually heal with non-surgical treatment. The length of time for healing depends on the severity of the injury.  RELATED COMPLICATIONS   Recurring symptoms, resulting in a chronic problem.  Chronic inflammation and pain in the low back.  Delayed healing or resolution of symptoms, especially if activity is resumed too soon.  Prolonged impairment.  Unstable or arthritic joints of the low back. TREATMENT  Treatment first involves the use of ice and medicine, to reduce pain and inflammation. The use of strengthening and stretching exercises may help reduce pain with activity. These exercises may be performed at home or with a therapist. Severe injuries may require referral to a therapist for further evaluation and treatment, such as ultrasound. Your caregiver may advise that you wear a back brace or corset, to help reduce pain and discomfort. Often, prolonged bed rest results in greater harm then benefit. Corticosteroid injections may be recommended. However, these should be reserved for the most serious cases. It is important to avoid using your back when lifting objects. At night, sleep on your back on a firm mattress, with a pillow placed under your knees. If non-surgical treatment is unsuccessful, surgery may be needed.  MEDICATION   If pain medicine is needed, nonsteroidal anti-inflammatory medicines (aspirin and ibuprofen), or other minor pain relievers (acetaminophen), are often advised.  Do not take pain medicine for 7 days before surgery.  Prescription pain relievers  may be given, if your caregiver thinks they are needed. Use only as directed and only as much as you need.  Ointments applied  to the skin may be helpful.  Corticosteroid injections may be given by your caregiver. These injections should be reserved for the most serious cases, because they may only be given a certain number of times. HEAT AND COLD  Cold treatment (icing) should be applied for 10 to 15 minutes every 2 to 3 hours for inflammation and pain, and immediately after activity that aggravates your symptoms. Use ice packs or an ice massage.  Heat treatment may be used before performing stretching and strengthening activities prescribed by your caregiver, physical therapist, or athletic trainer. Use a heat pack or a warm water soak. SEEK MEDICAL CARE IF:   Symptoms get worse or do not improve in 2 to 4 weeks, despite treatment.  You develop numbness or weakness in either leg.  You lose bowel or bladder function.  Any of the following occur after surgery: fever, increased pain, swelling, redness, drainage of fluids, or bleeding in the affected area.  New, unexplained symptoms develop. (Drugs used in treatment may produce side effects.) EXERCISES  RANGE OF MOTION (ROM) AND STRETCHING EXERCISES - Low Back Sprain Most people with lower back pain will find that their symptoms get worse with excessive bending forward (flexion) or arching at the lower back (extension). The exercises that will help resolve your symptoms will focus on the opposite motion.  Your physician, physical therapist or athletic trainer will help you determine which exercises will be most helpful to resolve your lower back pain. Do not complete any exercises without first consulting with your caregiver. Discontinue any exercises which make your symptoms worse, until you speak to your caregiver. If you have pain, numbness or tingling which travels down into your buttocks, leg or foot, the goal of the therapy is for these symptoms to move closer to your back and eventually resolve. Sometimes, these leg symptoms will get better, but your lower back  pain may worsen. This is often an indication of progress in your rehabilitation. Be very alert to any changes in your symptoms and the activities in which you participated in the 24 hours prior to the change. Sharing this information with your caregiver will allow him or her to most efficiently treat your condition. These exercises may help you when beginning to rehabilitate your injury. Your symptoms may resolve with or without further involvement from your physician, physical therapist or athletic trainer. While completing these exercises, remember:   Restoring tissue flexibility helps normal motion to return to the joints. This allows healthier, less painful movement and activity.  An effective stretch should be held for at least 30 seconds.  A stretch should never be painful. You should only feel a gentle lengthening or release in the stretched tissue. FLEXION RANGE OF MOTION AND STRETCHING EXERCISES: STRETCH - Flexion, Single Knee to Chest   Lie on a firm bed or floor with both legs extended in front of you.  Keeping one leg in contact with the floor, bring your opposite knee to your chest. Hold your leg in place by either grabbing behind your thigh or at your knee.  Pull until you feel a gentle stretch in your low back. Hold __________ seconds.  Slowly release your grasp and repeat the exercise with the opposite side. Repeat __________ times. Complete this exercise __________ times per day.  STRETCH - Flexion, Double Knee to Chest  Lie on a  firm bed or floor with both legs extended in front of you.  Keeping one leg in contact with the floor, bring your opposite knee to your chest.  Tense your stomach muscles to support your back and then lift your other knee to your chest. Hold your legs in place by either grabbing behind your thighs or at your knees.  Pull both knees toward your chest until you feel a gentle stretch in your low back. Hold __________ seconds.  Tense your stomach  muscles and slowly return one leg at a time to the floor. Repeat __________ times. Complete this exercise __________ times per day.  STRETCH - Low Trunk Rotation  Lie on a firm bed or floor. Keeping your legs in front of you, bend your knees so they are both pointed toward the ceiling and your feet are flat on the floor.  Extend your arms out to the side. This will stabilize your upper body by keeping your shoulders in contact with the floor.  Gently and slowly drop both knees together to one side until you feel a gentle stretch in your low back. Hold for __________ seconds.  Tense your stomach muscles to support your lower back as you bring your knees back to the starting position. Repeat the exercise to the other side. Repeat __________ times. Complete this exercise __________ times per day  EXTENSION RANGE OF MOTION AND FLEXIBILITY EXERCISES: STRETCH - Extension, Prone on Elbows   Lie on your stomach on the floor, a bed will be too soft. Place your palms about shoulder width apart and at the height of your head.  Place your elbows under your shoulders. If this is too painful, stack pillows under your chest.  Allow your body to relax so that your hips drop lower and make contact more completely with the floor.  Hold this position for __________ seconds.  Slowly return to lying flat on the floor. Repeat __________ times. Complete this exercise __________ times per day.  RANGE OF MOTION - Extension, Prone Press Ups  Lie on your stomach on the floor, a bed will be too soft. Place your palms about shoulder width apart and at the height of your head.  Keeping your back as relaxed as possible, slowly straighten your elbows while keeping your hips on the floor. You may adjust the placement of your hands to maximize your comfort. As you gain motion, your hands will come more underneath your shoulders.  Hold this position __________ seconds.  Slowly return to lying flat on the  floor. Repeat __________ times. Complete this exercise __________ times per day.  RANGE OF MOTION- Quadruped, Neutral Spine   Assume a hands and knees position on a firm surface. Keep your hands under your shoulders and your knees under your hips. You may place padding under your knees for comfort.  Drop your head and point your tailbone toward the ground below you. This will round out your lower back like an angry cat. Hold this position for __________ seconds.  Slowly lift your head and release your tail bone so that your back sags into a large arch, like an old horse.  Hold this position for __________ seconds.  Repeat this until you feel limber in your low back.  Now, find your "sweet spot." This will be the most comfortable position somewhere between the two previous positions. This is your neutral spine. Once you have found this position, tense your stomach muscles to support your low back.  Hold this position for  __________ seconds. Repeat __________ times. Complete this exercise __________ times per day.  STRENGTHENING EXERCISES - Low Back Sprain These exercises may help you when beginning to rehabilitate your injury. These exercises should be done near your "sweet spot." This is the neutral, low-back arch, somewhere between fully rounded and fully arched, that is your least painful position. When performed in this safe range of motion, these exercises can be used for people who have either a flexion or extension based injury. These exercises may resolve your symptoms with or without further involvement from your physician, physical therapist or athletic trainer. While completing these exercises, remember:   Muscles can gain both the endurance and the strength needed for everyday activities through controlled exercises.  Complete these exercises as instructed by your physician, physical therapist or athletic trainer. Increase the resistance and repetitions only as guided.  You may  experience muscle soreness or fatigue, but the pain or discomfort you are trying to eliminate should never worsen during these exercises. If this pain does worsen, stop and make certain you are following the directions exactly. If the pain is still present after adjustments, discontinue the exercise until you can discuss the trouble with your caregiver. STRENGTHENING - Deep Abdominals, Pelvic Tilt   Lie on a firm bed or floor. Keeping your legs in front of you, bend your knees so they are both pointed toward the ceiling and your feet are flat on the floor.  Tense your lower abdominal muscles to press your low back into the floor. This motion will rotate your pelvis so that your tail bone is scooping upwards rather than pointing at your feet or into the floor. With a gentle tension and even breathing, hold this position for __________ seconds. Repeat __________ times. Complete this exercise __________ times per day.  STRENGTHENING - Abdominals, Crunches   Lie on a firm bed or floor. Keeping your legs in front of you, bend your knees so they are both pointed toward the ceiling and your feet are flat on the floor. Cross your arms over your chest.  Slightly tip your chin down without bending your neck.  Tense your abdominals and slowly lift your trunk high enough to just clear your shoulder blades. Lifting higher can put excessive stress on the lower back and does not further strengthen your abdominal muscles.  Control your return to the starting position. Repeat __________ times. Complete this exercise __________ times per day.  STRENGTHENING - Quadruped, Opposite UE/LE Lift   Assume a hands and knees position on a firm surface. Keep your hands under your shoulders and your knees under your hips. You may place padding under your knees for comfort.  Find your neutral spine and gently tense your abdominal muscles so that you can maintain this position. Your shoulders and hips should form a rectangle  that is parallel with the floor and is not twisted.  Keeping your trunk steady, lift your right hand no higher than your shoulder and then your left leg no higher than your hip. Make sure you are not holding your breath. Hold this position for __________ seconds.  Continuing to keep your abdominal muscles tense and your back steady, slowly return to your starting position. Repeat with the opposite arm and leg. Repeat __________ times. Complete this exercise __________ times per day.  STRENGTHENING - Abdominals and Quadriceps, Straight Leg Raise   Lie on a firm bed or floor with both legs extended in front of you.  Keeping one leg in contact with  the floor, bend the other knee so that your foot can rest flat on the floor.  Find your neutral spine, and tense your abdominal muscles to maintain your spinal position throughout the exercise.  Slowly lift your straight leg off the floor about 6 inches for a count of 15, making sure to not hold your breath.  Still keeping your neutral spine, slowly lower your leg all the way to the floor. Repeat this exercise with each leg __________ times. Complete this exercise __________ times per day. POSTURE AND BODY MECHANICS CONSIDERATIONS - Low Back Sprain Keeping correct posture when sitting, standing or completing your activities will reduce the stress put on different body tissues, allowing injured tissues a chance to heal and limiting painful experiences. The following are general guidelines for improved posture. Your physician or physical therapist will provide you with any instructions specific to your needs. While reading these guidelines, remember:  The exercises prescribed by your provider will help you have the flexibility and strength to maintain correct postures.  The correct posture provides the best environment for your joints to work. All of your joints have less wear and tear when properly supported by a spine with good posture. This means you  will experience a healthier, less painful body.  Correct posture must be practiced with all of your activities, especially prolonged sitting and standing. Correct posture is as important when doing repetitive low-stress activities (typing) as it is when doing a single heavy-load activity (lifting). RESTING POSITIONS Consider which positions are most painful for you when choosing a resting position. If you have pain with flexion-based activities (sitting, bending, stooping, squatting), choose a position that allows you to rest in a less flexed posture. You would want to avoid curling into a fetal position on your side. If your pain worsens with extension-based activities (prolonged standing, working overhead), avoid resting in an extended position such as sleeping on your stomach. Most people will find more comfort when they rest with their spine in a more neutral position, neither too rounded nor too arched. Lying on a non-sagging bed on your side with a pillow between your knees, or on your back with a pillow under your knees will often provide some relief. Keep in mind, being in any one position for a prolonged period of time, no matter how correct your posture, can still lead to stiffness. PROPER SITTING POSTURE In order to minimize stress and discomfort on your spine, you must sit with correct posture. Sitting with good posture should be effortless for a healthy body. Returning to good posture is a gradual process. Many people can work toward this most comfortably by using various supports until they have the flexibility and strength to maintain this posture on their own. When sitting with proper posture, your ears will fall over your shoulders and your shoulders will fall over your hips. You should use the back of the chair to support your upper back. Your lower back will be in a neutral position, just slightly arched. You may place a small pillow or folded towel at the base of your lower back for   support.  When working at a desk, create an environment that supports good, upright posture. Without extra support, muscles tire, which leads to excessive strain on joints and other tissues. Keep these recommendations in mind: CHAIR:  A chair should be able to slide under your desk when your back makes contact with the back of the chair. This allows you to work closely.  The  chair's height should allow your eyes to be level with the upper part of your monitor and your hands to be slightly lower than your elbows. BODY POSITION  Your feet should make contact with the floor. If this is not possible, use a foot rest.  Keep your ears over your shoulders. This will reduce stress on your neck and low back. INCORRECT SITTING POSTURES  If you are feeling tired and unable to assume a healthy sitting posture, do not slouch or slump. This puts excessive strain on your back tissues, causing more damage and pain. Healthier options include:  Using more support, like a lumbar pillow.  Switching tasks to something that requires you to be upright or walking.  Talking a brief walk.  Lying down to rest in a neutral-spine position. PROLONGED STANDING WHILE SLIGHTLY LEANING FORWARD  When completing a task that requires you to lean forward while standing in one place for a long time, place either foot up on a stationary 2-4 inch high object to help maintain the best posture. When both feet are on the ground, the lower back tends to lose its slight inward curve. If this curve flattens (or becomes too large), then the back and your other joints will experience too much stress, tire more quickly, and can cause pain. CORRECT STANDING POSTURES Proper standing posture should be assumed with all daily activities, even if they only take a few moments, like when brushing your teeth. As in sitting, your ears should fall over your shoulders and your shoulders should fall over your hips. You should keep a slight tension in  your abdominal muscles to brace your spine. Your tailbone should point down to the ground, not behind your body, resulting in an over-extended swayback posture.  INCORRECT STANDING POSTURES  Common incorrect standing postures include a forward head, locked knees and/or an excessive swayback. WALKING Walk with an upright posture. Your ears, shoulders and hips should all line-up. PROLONGED ACTIVITY IN A FLEXED POSITION When completing a task that requires you to bend forward at your waist or lean over a low surface, try to find a way to stabilize 3 out of 4 of your limbs. You can place a hand or elbow on your thigh or rest a knee on the surface you are reaching across. This will provide you more stability, so that your muscles do not tire as quickly. By keeping your knees relaxed, or slightly bent, you will also reduce stress across your lower back. CORRECT LIFTING TECHNIQUES DO :  Assume a wide stance. This will provide you more stability and the opportunity to get as close as possible to the object which you are lifting.  Tense your abdominals to brace your spine. Bend at the knees and hips. Keeping your back locked in a neutral-spine position, lift using your leg muscles. Lift with your legs, keeping your back straight.  Test the weight of unknown objects before attempting to lift them.  Try to keep your elbows locked down at your sides in order get the best strength from your shoulders when carrying an object.  Always ask for help when lifting heavy or awkward objects. INCORRECT LIFTING TECHNIQUES DO NOT:   Lock your knees when lifting, even if it is a small object.  Bend and twist. Pivot at your feet or move your feet when needing to change directions.  Assume that you can safely pick up even a paperclip without proper posture.   This information is not intended to replace advice given  to you by your health care provider. Make sure you discuss any questions you have with your health  care provider.   Document Released: 05/03/2005 Document Revised: 05/24/2014 Document Reviewed: 08/15/2008 Elsevier Interactive Patient Education [employer].

## 2015-04-11 ENCOUNTER — Telehealth: Payer: Self-pay

## 2015-04-11 NOTE — Telephone Encounter (Signed)
PA completed for tizanidine on covermymeds. Pending.

## 2015-04-15 ENCOUNTER — Encounter: Payer: Self-pay | Admitting: Family Medicine

## 2015-04-15 NOTE — Telephone Encounter (Signed)
PA approved through 05/16/16. Notified pharm.  

## 2015-04-16 ENCOUNTER — Other Ambulatory Visit: Payer: Self-pay | Admitting: Family Medicine

## 2015-04-17 ENCOUNTER — Encounter: Payer: Self-pay | Admitting: Adult Health

## 2015-04-17 NOTE — Telephone Encounter (Signed)
Is patient still having pain?  Does she need refills of zanaflex

## 2015-04-18 NOTE — Telephone Encounter (Signed)
Pt reported that she is MUCH improved since she was here, but still does have some "twinges" now and again. She stated she can only take the zanaflex at night when she is not working, but would like RF to help relax her back then. Dr Tamala Julian, [place] to RF?

## 2015-04-22 ENCOUNTER — Telehealth: Payer: Self-pay

## 2015-04-22 NOTE — Telephone Encounter (Signed)
The patient is scheduled for the vascular ultrasound on 04/24/15 at 11am at [facility].  Patient advised.

## 2015-04-22 NOTE — Telephone Encounter (Signed)
Refill approved.

## 2015-04-24 ENCOUNTER — Ambulatory Visit (HOSPITAL_COMMUNITY): Payer: Medicare HMO

## 2015-05-01 ENCOUNTER — Ambulatory Visit (HOSPITAL_COMMUNITY)
Admission: RE | Admit: 2015-05-01 | Discharge: 2015-05-01 | Disposition: A | Discharge status: Home / Self Care | Payer: Medicare HMO | Source: Ambulatory Visit | Attending: Vascular Surgery | Admitting: Vascular Surgery

## 2015-05-01 DIAGNOSIS — M5441 Lumbago with sciatica, right side: Secondary | ICD-10-CM | POA: Diagnosis not present

## 2015-05-01 DIAGNOSIS — I77819 Aortic ectasia, unspecified site: Secondary | ICD-10-CM | POA: Diagnosis not present

## 2015-05-06 ENCOUNTER — Telehealth: Payer: Self-pay

## 2015-05-06 NOTE — Telephone Encounter (Signed)
Spoke with pt, I did not see a ultrasound here in [facility]. She is going to check with her PCP to see if they sent it there.

## 2015-05-06 NOTE — Telephone Encounter (Signed)
Pt had been referred and would like to know the results. Please call [phone]

## 2015-05-07 ENCOUNTER — Other Ambulatory Visit: Payer: Self-pay | Admitting: Internal Medicine

## 2015-05-13 ENCOUNTER — Ambulatory Visit (INDEPENDENT_AMBULATORY_CARE_PROVIDER_SITE_OTHER): Payer: Medicare HMO | Admitting: Internal Medicine

## 2015-05-13 ENCOUNTER — Other Ambulatory Visit (INDEPENDENT_AMBULATORY_CARE_PROVIDER_SITE_OTHER): Payer: Medicare HMO

## 2015-05-13 ENCOUNTER — Encounter: Payer: Self-pay | Admitting: Internal Medicine

## 2015-05-13 VITALS — BP 138/60 | HR 68 | Temp 98.2°F | Resp 16 | Ht 65.5 in | Wt 226.0 lb

## 2015-05-13 DIAGNOSIS — I714 Abdominal aortic aneurysm, without rupture, unspecified: Secondary | ICD-10-CM | POA: Insufficient documentation

## 2015-05-13 DIAGNOSIS — E042 Nontoxic multinodular goiter: Secondary | ICD-10-CM

## 2015-05-13 LAB — TSH: TSH: 1.2 u[IU]/mL (ref 0.35–4.50)

## 2015-05-13 LAB — T4: T4, Total: 7.4 ug/dL (ref 4.5–12.0)

## 2015-05-13 LAB — T3: T3, Total: 90 ng/dL (ref 80.0–204.0)

## 2015-05-13 NOTE — Progress Notes (Signed)
Subjective:  Patient ID: Victoria [name], female    DOB: 30-Jun-1945  Age: 69 y.o. MRN: [id number]  CC: Thyroid Problem   HPI Victoria [name] presents for follow-up on thyroid enlargement as well as possible abdominal aortic aneurysm. She is seeing a Psychologist, sport and exercise about thyroid enlargement and comes in today with a request for thyroid function studies. She was seen at an urgent care center over a month ago and had plain x-rays done of her lower back due to low back pain. The x-rays raised a concern about a dilation in her lower abdominal aorta so she eventually underwent abdominal aortic ultrasound. The result shows that she has a 2.4 x 2 cm distal aortic dilatation. The back pain has resolved and she does not experience abdominal pain.  Outpatient Prescriptions Prior to Visit  Medication Sig Dispense Refill  . albuterol (VENTOLIN HFA) 108 (90 BASE) MCG/ACT inhaler Inhale 1 puff into the lungs every 6 (six) hours as needed for wheezing. 18 each 5  . amLODipine (NORVASC) 10 MG tablet TAKE ONE TABLET BY MOUTH ONCE DAILY 30 tablet 11  . ANORO ELLIPTA 62.5-25 MCG/INH AEPB INHALE ONE PUFF BY MOUTH ONCE DAILY 60 each 5  . Aspirin-Calcium Carbonate (BAYER WOMENS) 81-300 MG TABS Take 1 tablet by mouth daily with breakfast.    . beclomethasone (QVAR) 80 MCG/ACT inhaler Inhale 2 puffs into the lungs 2 (two) times daily. 1 Inhaler 5  . carvedilol (COREG) 3.125 MG tablet Take 1 tablet (3.125 mg total) by mouth 2 (two) times daily with a meal. 90 tablet 3  . Cholecalciferol (VITAMIN D-3) 5000 UNITS TABS Take 1 tablet by mouth once a week.     . hydrochlorothiazide (HYDRODIURIL) 25 MG tablet Take 1 tablet (25 mg total) by mouth daily. 90 tablet 3  . ipratropium-albuterol (DUONEB) 0.5-2.5 (3) MG/3ML SOLN USE ONE VIAL VIA NEBULIZER 4 TIMES DAILY 360 mL 5  . lovastatin (MEVACOR) 20 MG tablet TAKE ONE TABLET BY MOUTH ONCE DAILY 30 tablet 11  . naproxen (NAPROSYN) 500 MG tablet Take 1 tablet (500 mg total) by mouth 2  (two) times daily with a meal. (Patient taking differently: Take 500 mg by mouth 2 (two) times daily as needed. ) 30 tablet 0  . Nutritional Supp - Diet Aids (CARB INTERCEPT/PHASE 2) CAPS Take 2 capsules by mouth 3 (three) times daily with meals as needed (for carb meals).    . OXYGEN Inhale 2 L into the lungs at bedtime.    [name] tiZANidine (ZANAFLEX) 4 MG capsule TAKE ONE CAPSULE BY MOUTH THREE TIMES DAILY AS NEEDED FOR MUSCLE SPASM 60 capsule 0  . Umeclidinium-Vilanterol (ANORO ELLIPTA) 62.5-25 MCG/INH AEPB Inhale 1 puff into the lungs daily. 60 each 0  . cetirizine (ZYRTEC ALLERGY) 10 MG tablet Take 1 tablet (10 mg total) by mouth daily. (Patient not taking: Reported on 05/13/2015) 30 tablet 5  . ranitidine (ZANTAC) 150 MG tablet Take 1 tablet (150 mg total) by mouth 2 (two) times daily. (Patient not taking: Reported on 05/13/2015) 60 tablet 5  . traMADol (ULTRAM) 50 MG tablet Take 1 tablet (50 mg total) by mouth every 6 (six) hours as needed. (Patient not taking: Reported on 05/13/2015) 40 tablet 0   No facility-administered medications prior to visit.    ROS Review of Systems  Constitutional: Negative.  Negative for fever, chills, diaphoresis, appetite change and fatigue.  HENT: Negative.  Negative for congestion, facial swelling, sinus pressure, tinnitus and trouble swallowing.   Eyes: Negative.  Respiratory: Positive for apnea. Negative for cough, choking, chest tightness, shortness of breath and stridor.   Cardiovascular: Negative.  Negative for chest pain, palpitations and leg swelling.  Gastrointestinal: Negative.  Negative for nausea, vomiting, abdominal pain, diarrhea, constipation and blood in stool.  Endocrine: Negative.   Genitourinary: Negative.  Negative for dysuria, urgency, flank pain and difficulty urinating.  Musculoskeletal: Negative.  Negative for myalgias, back pain, joint swelling, arthralgias and neck pain.  Skin: Negative.  Negative for color change and rash.    Allergic/Immunologic: Negative.   Neurological: Negative.  Negative for dizziness, syncope, weakness, light-headedness and numbness.  Hematological: Negative.  Negative for adenopathy. Does not bruise/bleed easily.  Psychiatric/Behavioral: Negative.     Objective:  BP 138/60 mmHg  Pulse 68  Temp(Src) 98.2 F (36.8 C) (Oral)  Resp 16  Ht 5' 5.5" (1.664 m)  Wt 226 lb (102.513 kg)  BMI 37.02 kg/m2  SpO2 96%  BP Readings from Last 3 Encounters:  05/13/15 138/60  04/07/15 146/80  03/18/15 138/72    Wt Readings from Last 3 Encounters:  05/13/15 226 lb (102.513 kg)  04/07/15 228 lb 6.4 oz (103.602 kg)  03/18/15 226 lb (102.513 kg)    Physical Exam  Constitutional: She is oriented to person, place, and time. No distress.  HENT:  Head: Normocephalic and atraumatic.  Mouth/Throat: Oropharynx is clear and moist. No oropharyngeal exudate.  Eyes: Conjunctivae are normal. Right eye exhibits no discharge. Left eye exhibits no discharge. No scleral icterus.  Neck: Normal range of motion. Neck supple. No JVD present. No tracheal deviation present. No thyromegaly present.  Cardiovascular: Normal rate, regular rhythm, normal heart sounds and intact distal pulses.  Exam reveals no gallop and no friction rub.   No murmur heard. Pulses:      Carotid pulses are 1+ on the right side, and 1+ on the left side.      Radial pulses are 1+ on the right side, and 1+ on the left side.       Femoral pulses are 1+ on the right side, and 1+ on the left side.      Popliteal pulses are 1+ on the right side, and 1+ on the left side.       Dorsalis pedis pulses are 1+ on the right side, and 1+ on the left side.       Posterior tibial pulses are 1+ on the right side, and 1+ on the left side.  Pulmonary/Chest: Effort normal and breath sounds normal. No stridor. No respiratory distress. She has no wheezes. She has no rales. She exhibits no tenderness.  Abdominal: Soft. Bowel sounds are normal. She exhibits no  distension and no mass. There is no tenderness. There is no rebound and no guarding.  Musculoskeletal: Normal range of motion. She exhibits no edema or tenderness.  Lymphadenopathy:    She has no cervical adenopathy.  Neurological: She is oriented to person, place, and time.  Skin: Skin is warm and dry. No rash noted. She is not diaphoretic. No erythema. No pallor.  Vitals reviewed.   Lab Results  Component Value Date   WBC 10.1 09/05/2014   HGB 13.2 09/05/2014   HCT 39.1 09/05/2014   PLT 269.0 09/05/2014   GLUCOSE 95 09/05/2014   CHOL 199 09/05/2014   TRIG 210.0* 09/05/2014   HDL 49.50 09/05/2014   LDLDIRECT 121.0 09/05/2014   LDLCALC 107* 07/06/2012   ALT 10 05/28/2014   AST 21 05/28/2014   NA 139 09/05/2014  K 3.6 09/05/2014   CL 101 09/05/2014   CREATININE 1.07 09/05/2014   BUN 19 09/05/2014   CO2 34* 09/05/2014   TSH 0.69 02/11/2014   INR 1.01 05/28/2014    No results found.  Assessment & Plan:   Mckenzie was seen today for thyroid problem.  Diagnoses and all orders for this visit:  AAA (abdominal aortic aneurysm) without rupture ([place])- this is a small dilatation and does not cause her any symptoms, will continue to monitor on an annual basis. -     Cancel: VAS US AORTA/IVC/ILIACS; Future -     Cancel: VAS US AORTA/IVC/ILIACS; Future  Nontoxic multinodular goiter- by my exam today her thyroid feels normal, clinically she appears to be euthyroid, I will check her thyroid function studies. -     TSH; Future -     T4; Future -     T3; Future   I have discontinued Ms. Lemert Umeclidinium-Vilanterol, cetirizine, ranitidine, and traMADol. I am also having her maintain her Vitamin D-3, CARB INTERCEPT/PHASE 2, Aspirin-Calcium Carbonate, OXYGEN, beclomethasone, hydrochlorothiazide, lovastatin, amLODipine, carvedilol, ANORO ELLIPTA, albuterol, naproxen, tiZANidine, and ipratropium-albuterol.  No orders of the defined types were placed in this encounter.      Follow-up: Return in about 4 months (around 09/11/2015).  Scarlette Calico, MD

## 2015-05-13 NOTE — Progress Notes (Signed)
Pre visit review using our clinic review tool, if applicable. No additional management support is needed unless otherwise documented below in the visit note. 

## 2015-05-13 NOTE — Patient Instructions (Signed)
Thyroid Nodule A thyroid nodule is an isolatedgrowth of thyroid cells that forms a lump in your thyroid gland. The thyroid gland is a butterfly-shaped gland. It is found in the lower front of your neck. This gland sends chemical messengers (hormones) through your blood to all parts of your body. These hormones are important in regulating your body temperature and helping your body to use energy. Thyroid nodules are common. Most are not cancerous (are benign). You may have one nodule or several nodules.  Different types of thyroid nodules include:  Nodules that grow and fill with fluid (thyroid cysts).  Nodules that produce too much thyroid hormone (hot nodules or hyperthyroid).  Nodules that produce no thyroid hormone (cold nodules or hypothyroid).  Nodules that form from cancer cells (thyroid cancers). CAUSES Usually, the cause of this condition is not known. RISK FACTORS Factors that make this condition more likely to develop include:  Increasing age. Thyroid nodules become more common in people who are older than 71 years of age.  Gender.  Benign thyroid nodules are more common in women.  Cancerous (malignant) thyroid nodules are more common in men.  A family history that includes:  Thyroid nodules.  Pheochromocytoma.  Thyroid carcinoma.  Hyperparathyroidism.  Certain kinds of thyroid diseases, such as Hashimoto thyroiditis.  Lack of iodine.  A history of head and neck radiation, such as from X-rays. SYMPTOMS It is common for this condition to cause no symptoms. If you have symptoms, they may include:  A lump in your lower neck.  Feeling a lump or tickle in your throat.  Pain in your neck, jaw, or ear.  Having trouble swallowing. Hot nodules may cause symptoms that include:  Weight loss.  Warm, flushed skin.  Feeling hot.  Feeling nervous.  A racing heartbeat. Cold nodules may cause symptoms that include:  Weight gain.  Dry skin.  Brittle hair.  This may also occur with hair loss.  Feeling cold.  Fatigue. Thyroid cancer nodules may cause symptoms that include:  Hard nodules that feel stuck to the thyroid gland.  Hoarseness.  Lumps in the glands near your thyroid (lymph nodes). DIAGNOSIS A thyroid nodule may be felt by your health care provider during a physical exam. This condition may also be diagnosed based on your symptoms. You may also have tests, including:  An ultrasound. This may be done to confirm the diagnosis.  A biopsy. This involves taking a sample from the nodule and looking at it under a microscope to see if the nodule is benign.  Blood tests to make sure that your thyroid is working properly.  Imaging tests such as MRI or CT scan may be done if:  Your nodule is large.  Your nodule is blocking your airway.  Cancer is suspected. TREATMENT Treatment depends on the cause and size of your nodule or nodules. If the nodule is benign, treatment may not be necessary. Your health care provider may monitor the nodule to see if it goes away without treatment. If the nodule continues to grow, is cancerous, or does not go away:  It may need to be drained with a needle.  It may need to be removed with surgery. If you have surgery, part or all of your thyroid gland may need to be removed as well. HOME CARE INSTRUCTIONS  Pay attention to any changes in your nodule.  Take over-the-counter and prescription medicines only as told by your health care provider.  Keep all follow-up visits as told by your health  care provider. This is important. SEEK MEDICAL CARE IF:  Your voice changes.  You have trouble swallowing.  You have pain in your neck, ear, or jaw that is getting worse.  Your nodule gets bigger.  Your nodule starts to make it harder for you to breathe. SEEK IMMEDIATE MEDICAL CARE IF:  You have a sudden fever.  You feel very weak.  Your muscles look like they are shrinking (muscle wasting).  You  have mood swings.  You feel very restless.  You feel confused.  You are seeing or hearing things that other people do not see or hear (having hallucinations).  You feel suddenly nauseous or throw up.  You suddenly have diarrhea.  You have chest pain.  There is a loss of consciousness.   This information is not intended to replace advice given to you by your health care provider. Make sure you discuss any questions you have with your health care provider.   Document Released: 03/26/2004 Document Revised: 01/22/2015 Document Reviewed: 08/14/2014 Elsevier Interactive Patient Education [employer].

## 2015-05-14 ENCOUNTER — Encounter: Payer: Self-pay | Admitting: Internal Medicine

## 2015-05-15 ENCOUNTER — Telehealth: Payer: Self-pay | Admitting: Internal Medicine

## 2015-05-15 NOTE — Telephone Encounter (Signed)
Pt request lab result that was done on 05/13/15. Please give her a call back  Phone # [phone]

## 2015-05-16 ENCOUNTER — Other Ambulatory Visit: Payer: Self-pay | Admitting: Internal Medicine

## 2015-05-16 NOTE — Telephone Encounter (Signed)
A letter was sent out on Wednesday. Pt was informed of this as well as lab results

## 2015-05-22 ENCOUNTER — Telehealth: Payer: Self-pay | Admitting: Internal Medicine

## 2015-05-22 DIAGNOSIS — J438 Other emphysema: Secondary | ICD-10-CM

## 2015-05-22 NOTE — Telephone Encounter (Signed)
Spoke with pt. She is requesting a portable neb machine. I have placed order. Nothing further needed

## 2015-05-29 ENCOUNTER — Telehealth: Payer: Self-pay | Admitting: Internal Medicine

## 2015-05-29 MED ORDER — BECLOMETHASONE DIPROPIONATE 80 MCG/ACT IN AERS: 2.0000 | INHALATION_SPRAY | Freq: Two times a day (BID) | RESPIRATORY_TRACT | Status: DC

## 2015-05-29 NOTE — Telephone Encounter (Signed)
Called spoke with pt. 4 samples of qvar left for pick up. Nothing further needed

## 2015-05-30 ENCOUNTER — Encounter: Payer: Self-pay | Admitting: Vascular Surgery

## 2015-06-05 ENCOUNTER — Encounter: Payer: Self-pay | Admitting: Vascular Surgery

## 2015-06-05 ENCOUNTER — Ambulatory Visit (INDEPENDENT_AMBULATORY_CARE_PROVIDER_SITE_OTHER): Payer: Medicare HMO | Admitting: Vascular Surgery

## 2015-06-05 VITALS — BP 153/65 | HR 63 | Temp 98.1°F | Resp 16 | Ht 64.5 in | Wt 218.0 lb

## 2015-06-05 DIAGNOSIS — I714 Abdominal aortic aneurysm, without rupture, unspecified: Secondary | ICD-10-CM

## 2015-06-05 NOTE — Progress Notes (Signed)
Filed Vitals:   06/05/15 0928 06/05/15 0932  BP: 147/67 153/65  Pulse: 67 63  Temp: 98.1 F (36.7 C)   TempSrc: Oral   Resp: 16   Height: 5' 4.5" (1.638 m)   Weight: 218 lb (98.884 kg)   SpO2: 99%

## 2015-06-05 NOTE — Progress Notes (Signed)
[facility] [place] HISTORY AND PHYSICAL   Referring Physician: Dr. Radene Knee History of Present Illness:  Patient is a 70 y.o. female who presents for evaluation of enlargement of the abdominal aorta. This was dissected on a routine lumbar spine x-ray. Patient denies any symptoms of abdominal pain currently. Her grandmother had an abdominal aortic aneurysm. She does have a history of hypertension and hyperlipidemia. These are currently been stable. She is a former smoker but quit 5 years ago.  She does have COPD and is on home oxygen at nighttime.  Past Medical History  Diagnosis Date  . Asthma     uses advair   . Arthritis     left ankle, right knee, right SI joint, wrists  . Varicella as child  . 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 Hypertension   . Hyperlipidemia   . Tuberculosis 2009    h/o positive PPD- seen at health department, had  normal chest x-ray. she was not treated for a  new conversion.  [name] COPD (chronic obstructive pulmonary disease) ([place])   . Murmur, cardiac 1950    Past Surgical History  Procedure Laterality Date  . Excision of pelvic absess, right ovary      2008  . Appendectomy      2008  . Cesarean section      30    Social History Social History  Substance Use Topics  . Smoking status: Former Smoker -- 0.50 packs/day for 40 years    Types: Cigarettes    Quit date: 04/16/2011  . Smokeless tobacco: Former Systems developer    Quit date: 04/16/2011     Comment: quit that date when she had to go to ER   . Alcohol Use: 0.0 oz/week    0 Standard drinks or equivalent per week     Comment: rare occasion    Family History Family History  Problem Relation Age of Onset  . Heart disease Mother     MI - fatal  . Hypertension Mother   . Stroke Father 68    fatal  . Alzheimer's disease Father   . Alzheimer's disease Brother   . Hyperlipidemia Brother   . Hypertension Brother   . Diabetes Brother   . Hypertension Brother   .  Hyperlipidemia Brother     Allergies  Allergies  Allergen Reactions  . Nickel Rash    Severe rash to infection: pt is allergic to all metals other than sterling silver or gold jewelry.   [name] [Olmesartan] Swelling    Swelling of face and arms   . [name] [Valsartan] Swelling    Swelling of face and arms   . Lisinopril Cough  . Codeine Other (See Comments)    jittery  . Monosodium Glutamate Other (See Comments)    Facial swelling per pt     Current Outpatient Prescriptions  Medication Sig Dispense Refill  . albuterol (VENTOLIN HFA) 108 (90 BASE) MCG/ACT inhaler Inhale 1 puff into the lungs every 6 (six) hours as needed for wheezing. 18 each 5  . amLODipine (NORVASC) 10 MG tablet TAKE ONE TABLET BY MOUTH ONCE DAILY 30 tablet 11  . ANORO ELLIPTA 62.5-25 MCG/INH AEPB INHALE ONE PUFF BY MOUTH ONCE DAILY 60 each 5  . Aspirin-Calcium Carbonate (BAYER WOMENS) 81-300 MG TABS Take 1 tablet by mouth daily with breakfast.    . beclomethasone (QVAR) 80 MCG/ACT inhaler Inhale 2 puffs into the lungs 2 (two) times daily.  4 Inhaler 0  . carvedilol (COREG) 3.125 MG tablet Take 1 tablet (3.125 mg total) by mouth 2 (two) times daily with a meal. 90 tablet 3  . Cholecalciferol (VITAMIN D-3) 5000 UNITS TABS Take 1 tablet by mouth once a week.     . hydrochlorothiazide (HYDRODIURIL) 25 MG tablet Take 1 tablet (25 mg total) by mouth daily. 90 tablet 3  . ipratropium-albuterol (DUONEB) 0.5-2.5 (3) MG/3ML SOLN USE ONE VIAL VIA NEBULIZER 4 TIMES DAILY 360 mL 5  . lovastatin (MEVACOR) 20 MG tablet TAKE ONE TABLET BY MOUTH ONCE DAILY 30 tablet 11  . naproxen (NAPROSYN) 500 MG tablet Take 1 tablet (500 mg total) by mouth 2 (two) times daily with a meal. (Patient taking differently: Take 500 mg by mouth 2 (two) times daily as needed. ) 30 tablet 0  . Nutritional Supp - Diet Aids (CARB INTERCEPT/PHASE 2) CAPS Take 2 capsules by mouth 3 (three) times daily with meals as needed (for carb meals). Reported on  06/05/2015    . OXYGEN Inhale 2 L into the lungs at bedtime. Reported on 06/05/2015    . tiZANidine (ZANAFLEX) 4 MG capsule TAKE ONE CAPSULE BY MOUTH THREE TIMES DAILY AS NEEDED FOR MUSCLE SPASM (Patient not taking: Reported on 06/05/2015) 60 capsule 0   No current facility-administered medications for this visit.    ROS:   General:  No weight loss, Fever, chills  HEENT: No recent headaches, no nasal bleeding, no visual changes, no sore throat  Neurologic: No dizziness, blackouts, seizures. No recent symptoms of stroke or mini- stroke. No recent episodes of slurred speech, or temporary blindness.  Cardiac: No recent episodes of chest pain/pressure, no shortness of breath at rest.  + shortness of breath with exertion.  Denies history of atrial fibrillation or irregular heartbeat  Vascular: No history of rest pain in feet.  No history of claudication.  No history of non-healing ulcer, No history of DVT   Pulmonary: No home oxygen, no productive cough, no hemoptysis,  + asthma or wheezing  Musculoskeletal:  [ ]  Arthritis, [ ]  Low back pain,  [ ]  Joint pain  Hematologic:No history of hypercoagulable state.  No history of easy bleeding.  No history of anemia  Gastrointestinal: No hematochezia or melena,  No gastroesophageal reflux, no trouble swallowing  Urinary: [ ]  chronic Kidney disease, [ ]  on HD - [ ]  MWF or [ ]  TTHS, [ ]  Burning with urination, [ ]  Frequent urination, [ ]  Difficulty urinating;   Skin: No rashes  Psychological: No history of anxiety,  No history of depression   Physical Examination  Filed Vitals:   06/05/15 0928 06/05/15 0932  BP: 147/67 153/65  Pulse: 67 63  Temp: 98.1 F (36.7 C)   TempSrc: Oral   Resp: 16   Height: 5' 4.5" (1.638 m)   Weight: 218 lb (98.884 kg)   SpO2: 99%     Body mass index is 36.86 kg/(m^2).  General:  Alert and oriented, no acute distress HEENT: Normal Neck: No bruit or JVD Pulmonary: Clear to auscultation  bilaterally Cardiac: Regular Rate and Rhythm without murmur Abdomen: Soft, non-tender, non-distended, no mass Skin: No rash Extremity Pulses:  2+ radial, brachial, femoral, dorsalis pedis, posterior tibial pulses bilaterally Musculoskeletal: No deformity or edema  Neurologic: Upper and lower extremity motor 5/5 and symmetric  DATA:  Patient had a duplex ultrasound of her abdominal aorta on 05/01/2015. This showed an aortic diameter of 2.4 cm.   ASSESSMENT:  Slightly dilated  segment of the infrarenal abdominal aorta. We would only consider repair if this reaches a size of greater than 5-1/2 cm in diameter.   PLAN:  Follow-up aortic ultrasound in 1 year. If there is no significant change that time we may consider spacing out her appointments further.  Ruta Hinds, MD [facility] [place] Office: [phone] Pager: [phone]

## 2015-06-24 ENCOUNTER — Ambulatory Visit (INDEPENDENT_AMBULATORY_CARE_PROVIDER_SITE_OTHER): Payer: Medicare HMO | Admitting: Internal Medicine

## 2015-06-24 ENCOUNTER — Encounter: Payer: Self-pay | Admitting: Internal Medicine

## 2015-06-24 VITALS — BP 148/80 | HR 73 | Ht 64.5 in | Wt 215.4 lb

## 2015-06-24 DIAGNOSIS — E669 Obesity, unspecified: Secondary | ICD-10-CM | POA: Diagnosis not present

## 2015-06-24 DIAGNOSIS — J441 Chronic obstructive pulmonary disease with (acute) exacerbation: Secondary | ICD-10-CM | POA: Insufficient documentation

## 2015-06-24 DIAGNOSIS — Z20828 Contact with and (suspected) exposure to other viral communicable diseases: Secondary | ICD-10-CM | POA: Diagnosis not present

## 2015-06-24 MED ORDER — AZITHROMYCIN 250 MG PO TABS: ORAL_TABLET | ORAL | Status: DC

## 2015-06-24 MED ORDER — PREDNISONE 10 MG PO TABS: ORAL_TABLET | ORAL | Status: DC

## 2015-06-24 NOTE — Patient Instructions (Addendum)
ICD-9-CM ICD-10-CM   1. Exposure to the flu V01.79 Z20.828   2. COPD exacerbation ([place]) 491.21 J44.1   3. Obesity 278.00 E66.9    #Followup  - next available to discuss weight loss  #COPD exac  - Z pak  - Please take prednisone 40 mg x1 day, then 30 mg x1 day, then 20 mg x1 day, then 10 mg x1 day, and then 5 mg x1 day and stop - continue regular duoneb and QVAR At fu will discuss lung cancer screen and discontinuing Coreg  #Weight #WEight Management  - tak diet sheet    -- follow low glycemic diet plan that I outlined for you after extensive discussion.   - General  - drink lot of water  - avoid all moderate and high glycemic foods especially bad fruits, breads, pastas, fried foods, battered foods, sugary foods (these are the food in centerl and right lane that you have to avoid)  - make non-starchy vegetables your base in terms of volume you eat; 50% of what you see on the plate and goes into your mouth should be these vegetables (the vegetables in the left lane)  - always make sure you balance good carbs, good protein and good fat source  - good carbs are non-starchy vegetables, uncanned beans in the left column and low glycemic fruits in the left colum  - good protein source is egg white, beans, tofu, fish, chicken breast, fish, [place] and bison. Remember meat has to be skinless  - good healthy fat source is nuts, and fish   - focusing on eating right healthy foods (left lane) and avoiding unhealthy foods (middle and right lane) is better way to lose weight than to go hypo-caloric  - focus on staying full by eating right  - having a daily and weekly plan for what you will eat and where you will eat depending on your work, social life schedule is very important   - watch out for misleading labels on grocery aisle: High Fiber and Low Fat labeled foods generally are high in bad carbs or sugar  - measure weight once  a week  - discipline and attitude is key. Do not care for anyone  else's opinion or feelings. Only yours matters   - For breakfast  - most important meal of the day. So, eat daily breakfast. Do not skip   - recommend 1/2 to 1 cup steel cut oat meal or 1/2 to 1 cup fiber one 60 cal   Or  1 to 1.5 cups Kashi go-lean with non-fat plain milk or 60 Cal Silk Soy mild. Can add Berries. Can have egg at same time for breakfast  - For snacks  - recommend total 2-3 snacks per day  - snack should be light and filling  - best times are between breakfast and lunch, lunch and dinner and sometimes post-dinner snack  - Nut are great snacks. Stick to low glycemic nuts (less than 50gm per day) and eat only the nuts in the left lane like peanuts, pista, almond, walnut  - Low glycemic fruits are great snacks. Have 1-2 servings each day of fruits from the left lane   - If you like yogurt or cottage cheese - recommend Oikos or Fage 0% greek yogourt or Plan non-fat yogurt or Breakstone non-fat cottage cheese. Theyse have the least sugar. Do no exceed 100-200 gram per day. Fruit yogurts are the worst  - Good Protein Shakes are good snacks:  EAS Abbot Whey  Powder shake, EAS Myoplex lite, EAS Carb Control. Muscle Milk Shake  - For Lunch and dinner  - unlimited non-starchy vegetable (prefer raw fresh or roasted or grilled) with skinless chicken or fish  - Special Notes  - Nuts: Nut are great snacks and have heart benefits. Stick to low glycemic nuts (less than 50gm per day) and eat only the nuts in the left lane like peanuts, pista, almond, walnuts  -  Ok to eat above nuts daily but only < 50gm/day  - If you eat more than 50gm/day then you run risk of eating too many calories or saturated fat  - AVoid nuts glazed with sugar. Nuts have to be in salted/original form or roasted   - Fruits: Eat 1-2 fresh fruit servings daily but fruits can be dangerous because of high sugar content. So, choose your fruits wisely. Eat only the low glycemic fruits (left lane). Eat them fresh.  Do not eat  them canned  - Avoid all fresh juices except if you use the low glycemic fruits and make them yourself without adding extra sugar   - Dairy: Is optional. Eat zero fat or low fat, fruit free yogurts or cottage cheese but not more than 100-200g per day  - Restaurant  - all restaurants have bad and good choices. Even fast food restaurants offer you good choices  - at restaurants do no fall prey to social pressure.. One way to eat healthy at restaurant is to eat healthy snack or light healthy meal before you go to restaurant so that will prevent your cravings  - Restaurants with worst choices: [place] (except [employer], or [employer]), [place], [place]. At these restaurants avoid the bread, curry, fried and battered foods and chips  - Restaurants with best choices: [place]reek, [place], [place], [place] (again here avoid bread, deep fried stuffed and pasta)  - Restaurants with Ok choice: McDonald's, [employer], [employer] (again here avoid the bread, fried stuff, fried meat)  - Always ask for grilled meat or vegetables, and fresh salad choices (get your salad dressing as low fat and to the side)  - For Will Power  - Prepare, prepare, prepare: Plan your day and think of what you will eat and when you will eat and where you will eat.  - Snack good stuff to keep yourself full to avoid hunger and losing will power  - 1 minute fast walk when feeling cravings could  Help  -  Eat breakfast  - Berries are excellent to control sugar cravings  - Proteins can prevent cravings  - Fats like nuts keep you full and prevent cravings  - Avoid hunger; stay ahead by eating small amounts of the right food  - Avoid hunger; stay ahead by eating frequently  - Drink lot of water  - Eat slower

## 2015-06-24 NOTE — Progress Notes (Signed)
Subjective:     Patient ID: Victoria [name], female   DOB: [birth year], 70 y.o.   MRN: [id number]  HPI     OV 06/24/2015  Chief Complaint  Patient presents with  . Follow-up    Pt states she is getting over a cold. Pt c/o prod cough with light yellow mucus, increase in SOB x 1 week. Pt denies f/c/s and CP/tightness.    Gold stage III COPD patient on duo neb and Qvar. Noticed also be on Coreg nonspecific beta blocker.   She's had the flu shot but 10 days ago was exposed to her son who was confirmed to have influenza at an urgent care. A few days later developed sinus congestion and classic flu symptoms. Currently she is improved he did she has not taken any antibiotics or prednisone for the same. She never took any Tamiflu it as prophylaxis OR as treatment. However current improvement is only "somewhat". Still has significant residual symptoms. But he clearly having sinus headaches a lot worsening shortness of breath, chest tightness, wheezing and some yellow sputum  New problem:   - She reports she's been diagnosed with 2 cm abdominal aortic aneurysm. She is now trying to lose weight and wants help. She is asking for my help in this regard.    Allergies  Allergen Reactions  . Nickel Rash    Severe rash to infection: pt is allergic to all metals other than sterling silver or gold jewelry.   [name] [Olmesartan] Swelling    Swelling of face and arms   . [name] [Valsartan] Swelling    Swelling of face and arms   . Lisinopril Cough  . Codeine Other (See Comments)    jittery  . Monosodium Glutamate Other (See Comments)    Facial swelling per pt      reports that she quit smoking about 4 years ago. Her smoking use included Cigarettes. She has a 20 pack-year smoking history. She quit smokeless tobacco use about 4 years ago.    Current outpatient prescriptions:  .  albuterol (VENTOLIN HFA) 108 (90 BASE) MCG/ACT inhaler, Inhale 1 puff into the lungs every 6 (six) hours as needed for  wheezing., Disp: 18 each, Rfl: 5 .  amLODipine (NORVASC) 10 MG tablet, TAKE ONE TABLET BY MOUTH ONCE DAILY, Disp: 30 tablet, Rfl: 11 .  Aspirin-Calcium Carbonate (BAYER WOMENS) 81-300 MG TABS, Take 1 tablet by mouth daily with breakfast., Disp: , Rfl:  .  beclomethasone (QVAR) 80 MCG/ACT inhaler, Inhale 2 puffs into the lungs 2 (two) times daily., Disp: 4 Inhaler, Rfl: 0 .  carvedilol (COREG) 3.125 MG tablet, Take 1 tablet (3.125 mg total) by mouth 2 (two) times daily with a meal., Disp: 90 tablet, Rfl: 3 .  Cholecalciferol (VITAMIN D-3) 5000 UNITS TABS, Take 1 tablet by mouth once a week. , Disp: , Rfl:  .  hydrochlorothiazide (HYDRODIURIL) 25 MG tablet, Take 1 tablet (25 mg total) by mouth daily., Disp: 90 tablet, Rfl: 3 .  ipratropium-albuterol (DUONEB) 0.5-2.5 (3) MG/3ML SOLN, USE ONE VIAL VIA NEBULIZER 4 TIMES DAILY, Disp: 360 mL, Rfl: 5 .  lovastatin (MEVACOR) 20 MG tablet, TAKE ONE TABLET BY MOUTH ONCE DAILY, Disp: 30 tablet, Rfl: 11 .  naproxen (NAPROSYN) 500 MG tablet, Take 1 tablet (500 mg total) by mouth 2 (two) times daily with a meal. (Patient not taking: Reported on 06/24/2015), Disp: 30 tablet, Rfl: 0 .  OXYGEN, Inhale 2 L into the lungs at bedtime. Reported on 06/24/2015, Disp: ,  Rfl:  .  tiZANidine (ZANAFLEX) 4 MG capsule, TAKE ONE CAPSULE BY MOUTH THREE TIMES DAILY AS NEEDED FOR MUSCLE SPASM (Patient not taking: Reported on 06/24/2015), Disp: 60 capsule, Rfl: 0  Allergies  Allergen Reactions  . Nickel Rash    Severe rash to infection: pt is allergic to all metals other than sterling silver or gold jewelry.   [name] [Olmesartan] Swelling    Swelling of face and arms   . [name] [Valsartan] Swelling    Swelling of face and arms   . Lisinopril Cough  . Codeine Other (See Comments)    jittery  . Monosodium Glutamate Other (See Comments)    Facial swelling per pt    Immunization History  Administered Date(s) Administered  . Influenza Split 01/16/2011  . Influenza,inj,Quad  PF,36+ Mos 02/01/2013, 02/27/2014, 04/07/2015  . Pneumococcal Conjugate-13 02/01/2013  . Pneumococcal Polysaccharide-23 10/29/2014  . Td 12/06/2011      Review of Systems Per [facility]    Objective:   Physical Exam  Constitutional: She is oriented to person, place, and time. She appears well-developed and well-nourished. No distress.  HENT:  Head: Normocephalic and atraumatic.  Right Ear: External ear normal.  Left Ear: External ear normal.  Mouth/Throat: Oropharynx is clear and moist. No oropharyngeal exudate.  Eyes: Conjunctivae and EOM are normal. Pupils are equal, round, and reactive to light. Right eye exhibits no discharge. Left eye exhibits no discharge. No scleral icterus.  Neck: Normal range of motion. Neck supple. No JVD present. No tracheal deviation present. No thyromegaly present.  Cardiovascular: Normal rate, regular rhythm, normal heart sounds and intact distal pulses.  Exam reveals no gallop and no friction rub.   No murmur heard. Pulmonary/Chest: Effort normal. No respiratory distress. She has wheezes. She has no rales. She exhibits no tenderness.  Abdominal: Soft. Bowel sounds are normal. She exhibits no distension and no mass. There is no tenderness. There is no rebound and no guarding.  Musculoskeletal: Normal range of motion. She exhibits no edema or tenderness.  Lymphadenopathy:    She has no cervical adenopathy.  Neurological: She is alert and oriented to person, place, and time. She has normal reflexes. No cranial nerve deficit. She exhibits normal muscle tone. Coordination normal.  Skin: Skin is warm and dry. No rash noted. She is not diaphoretic. No erythema. No pallor.  Psychiatric: She has a normal mood and affect. Her behavior is normal. Judgment and thought content normal.  Vitals reviewed.   Filed Vitals:   06/24/15 1342  BP: 148/80  Pulse: 73  Height: 5' 4.5" (1.638 m)  Weight: 215 lb 6.4 oz (97.705 kg)  SpO2: 95%   Estimated body mass index is  36.42 kg/(m^2) as calculated from the following:   Height as of this encounter: 5' 4.5" (1.638 m).   Weight as of this encounter: 215 lb 6.4 oz (97.705 kg).      Assessment:       ICD-9-CM ICD-10-CM   1. Exposure to the flu V01.79 Z20.828   2. COPD exacerbation ([place]) 491.21 J44.1   3. Obesity 278.00 E66.9         Plan:      #Followup  - next available to discuss weight loss  #COPD exac  - Z pak  - Please take prednisone 40 mg x1 day, then 30 mg x1 day, then 20 mg x1 day, then 10 mg x1 day, and then 5 mg x1 day and stop - continue regular duoneb and QVAR At fu  will discuss lung cancer screen  #Weight #WEight Management  - tak diet sheet    -- follow low glycemic diet plan that I outlined for you after extensive discussion.   - General  - drink lot of water  - avoid all moderate and high glycemic foods especially bad fruits, breads, pastas, fried foods, battered foods, sugary foods (these are the food in centerl and right lane that you have to avoid)  - make non-starchy vegetables your base in terms of volume you eat; 50% of what you see on the plate and goes into your mouth should be these vegetables (the vegetables in the left lane)  - always make sure you balance good carbs, good protein and good fat source  - good carbs are non-starchy vegetables, uncanned beans in the left column and low glycemic fruits in the left colum  - good protein source is egg white, beans, tofu, fish, chicken breast, fish, [place] and bison. Remember meat has to be skinless  - good healthy fat source is nuts, and fish   - focusing on eating right healthy foods (left lane) and avoiding unhealthy foods (middle and right lane) is better way to lose weight than to go hypo-caloric  - focus on staying full by eating right  - having a daily and weekly plan for what you will eat and where you will eat depending on your work, social life schedule is very important   - watch out for misleading labels on  grocery aisle: High Fiber and Low Fat labeled foods generally are high in bad carbs or sugar  - measure weight once  a week  - discipline and attitude is key. Do not care for anyone else's opinion or feelings. Only yours matters   - For breakfast  - most important meal of the day. So, eat daily breakfast. Do not skip   - recommend 1/2 to 1 cup steel cut oat meal or 1/2 to 1 cup fiber one 60 cal   Or  1 to 1.5 cups Kashi go-lean with non-fat plain milk or 60 Cal Silk Soy mild. Can add Berries. Can have egg at same time for breakfast  - For snacks  - recommend total 2-3 snacks per day  - snack should be light and filling  - best times are between breakfast and lunch, lunch and dinner and sometimes post-dinner snack  - Nut are great snacks. Stick to low glycemic nuts (less than 50gm per day) and eat only the nuts in the left lane like peanuts, pista, almond, walnut  - Low glycemic fruits are great snacks. Have 1-2 servings each day of fruits from the left lane   - If you like yogurt or cottage cheese - recommend Oikos or Fage 0% greek yogourt or Plan non-fat yogurt or Breakstone non-fat cottage cheese. Theyse have the least sugar. Do no exceed 100-200 gram per day. Fruit yogurts are the worst  - Good Protein Shakes are good snacks:  EAS Abbot Whey Powder shake, EAS Myoplex lite, EAS Carb Control. Muscle Milk Shake  - For Lunch and dinner  - unlimited non-starchy vegetable (prefer raw fresh or roasted or grilled) with skinless chicken or fish  - Special Notes  - Nuts: Nut are great snacks and have heart benefits. Stick to low glycemic nuts (less than 50gm per day) and eat only the nuts in the left lane like peanuts, pista, almond, walnuts  -  Ok to eat above nuts daily but only < 50gm/day  -  If you eat more than 50gm/day then you run risk of eating too many calories or saturated fat  - AVoid nuts glazed with sugar. Nuts have to be in salted/original form or roasted   - Fruits: Eat 1-2 fresh  fruit servings daily but fruits can be dangerous because of high sugar content. So, choose your fruits wisely. Eat only the low glycemic fruits (left lane). Eat them fresh.  Do not eat them canned  - Avoid all fresh juices except if you use the low glycemic fruits and make them yourself without adding extra sugar   - Dairy: Is optional. Eat zero fat or low fat, fruit free yogurts or cottage cheese but not more than 100-200g per day  - Restaurant  - all restaurants have bad and good choices. Even fast food restaurants offer you good choices  - at restaurants do no fall prey to social pressure.. One way to eat healthy at restaurant is to eat healthy snack or light healthy meal before you go to restaurant so that will prevent your cravings  - Restaurants with worst choices: [place] (except [employer], or [employer]), [place], [place]. At these restaurants avoid the bread, curry, fried and battered foods and chips  - Restaurants with best choices: greek, [place], [place], [place] (again here avoid bread, deep fried stuffed and pasta)  - Restaurants with Ok choice: McDonald's, [employer], [employer] (again here avoid the bread, fried stuff, fried meat)  - Always ask for grilled meat or vegetables, and fresh salad choices (get your salad dressing as low fat and to the side)  - For Will Power  - Prepare, prepare, prepare: Plan your day and think of what you will eat and when you will eat and where you will eat.  - Snack good stuff to keep yourself full to avoid hunger and losing will power  - 1 minute fast walk when feeling cravings could  Help  -  Eat breakfast  - Berries are excellent to control sugar cravings  - Proteins can prevent cravings  - Fats like nuts keep you full and prevent cravings  - Avoid hunger; stay ahead by eating small amounts of the right food  - Avoid hunger; stay ahead by eating frequently  - Drink lot of water  - Eat slower     Dr. Brand Males, M.D., [facility].C.P Pulmonary  and Critical Care Medicine Staff Physician [facility] Pager: [phone], If no answer or between  15:00h - 7:00h: call [phone]  06/24/2015 2:05 PM

## 2015-07-03 ENCOUNTER — Ambulatory Visit (INDEPENDENT_AMBULATORY_CARE_PROVIDER_SITE_OTHER): Payer: Medicare HMO | Admitting: Internal Medicine

## 2015-07-03 ENCOUNTER — Encounter: Payer: Self-pay | Admitting: Internal Medicine

## 2015-07-03 VITALS — BP 152/76 | HR 79 | Ht 64.5 in | Wt 216.4 lb

## 2015-07-03 DIAGNOSIS — E669 Obesity, unspecified: Secondary | ICD-10-CM

## 2015-07-03 NOTE — Progress Notes (Signed)
Subjective:     Patient ID: Victoria [name], female   DOB: 01/21/1946, 70 y.o.   MRN: [id number]  HPI   OV  07/03/2015  To discuss weight loss mgmt and diet      has a past medical history of Asthma; Arthritis; Varicella (as child); Heart murmur; Hypertension; Hyperlipidemia; Tuberculosis (2009); COPD (chronic obstructive pulmonary disease) ([place]); and Murmur, cardiac (1950).   reports that she quit smoking about 4 years ago. Her smoking use included Cigarettes. She has a 20 pack-year smoking history. She quit smokeless tobacco use about 4 years ago.  Past Surgical History  Procedure Laterality Date  . Excision of pelvic absess, right ovary      2008  . Appendectomy      2008  . Cesarean section      1972    Allergies  Allergen Reactions  . Nickel Rash    Severe rash to infection: pt is allergic to all metals other than sterling silver or gold jewelry.   [name] [Olmesartan] Swelling    Swelling of face and arms   . [name] [Valsartan] Swelling    Swelling of face and arms   . Lisinopril Cough  . Codeine Other (See Comments)    jittery  . Monosodium Glutamate Other (See Comments)    Facial swelling per pt    Immunization History  Administered Date(s) Administered  . Influenza Split 01/16/2011  . Influenza,inj,Quad PF,36+ Mos 02/01/2013, 02/27/2014, 04/07/2015  . Pneumococcal Conjugate-13 02/01/2013  . Pneumococcal Polysaccharide-23 10/29/2014  . Td 12/06/2011    Family History  Problem Relation Age of Onset  . Heart disease Mother     MI - fatal  . Hypertension Mother   . Stroke Father 56    fatal  . Alzheimer's disease Father   . Alzheimer's disease Brother   . Hyperlipidemia Brother   . Hypertension Brother   . Diabetes Brother   . Hypertension Brother   . Hyperlipidemia Brother      Current outpatient prescriptions:  .  albuterol (VENTOLIN HFA) 108 (90 BASE) MCG/ACT inhaler, Inhale 1 puff into the lungs every 6 (six) hours as needed for wheezing.,  Disp: 18 each, Rfl: 5 .  amLODipine (NORVASC) 10 MG tablet, TAKE ONE TABLET BY MOUTH ONCE DAILY, Disp: 30 tablet, Rfl: 11 .  Aspirin-Calcium Carbonate (BAYER WOMENS) 81-300 MG TABS, Take 1 tablet by mouth daily with breakfast., Disp: , Rfl:  .  beclomethasone (QVAR) 80 MCG/ACT inhaler, Inhale 2 puffs into the lungs 2 (two) times daily., Disp: 4 Inhaler, Rfl: 0 .  carvedilol (COREG) 3.125 MG tablet, Take 1 tablet (3.125 mg total) by mouth 2 (two) times daily with a meal., Disp: 90 tablet, Rfl: 3 .  Cholecalciferol (VITAMIN D-3) 5000 UNITS TABS, Take 1 tablet by mouth once a week. , Disp: , Rfl:  .  hydrochlorothiazide (HYDRODIURIL) 25 MG tablet, Take 1 tablet (25 mg total) by mouth daily., Disp: 90 tablet, Rfl: 3 .  ipratropium-albuterol (DUONEB) 0.5-2.5 (3) MG/3ML SOLN, USE ONE VIAL VIA NEBULIZER 4 TIMES DAILY, Disp: 360 mL, Rfl: 5 .  lovastatin (MEVACOR) 20 MG tablet, TAKE ONE TABLET BY MOUTH ONCE DAILY, Disp: 30 tablet, Rfl: 11 .  naproxen (NAPROSYN) 500 MG tablet, Take 1 tablet (500 mg total) by mouth 2 (two) times daily with a meal., Disp: 30 tablet, Rfl: 0 .  OXYGEN, Inhale 2 L into the lungs at bedtime. Reported on 06/24/2015, Disp: , Rfl:  .  tiZANidine (ZANAFLEX) 4 MG capsule,  TAKE ONE CAPSULE BY MOUTH THREE TIMES DAILY AS NEEDED FOR MUSCLE SPASM, Disp: 60 capsule, Rfl: 0    Review of Systems     Objective:   Physical Exam Filed Vitals:   07/03/15 1215  BP: 152/76  Pulse: 79  Height: 5' 4.5" (1.638 m)  Weight: 216 lb 6.4 oz (98.158 kg)  SpO2: 95%       Assessment:     Obesity   ICD-9-CM ICD-10-CM   1. Obesity 278.00 E66.9         Plan:     #WEight Management    - we discussed extensively about weight management   - follow low glycemic diet plan that I outlined for you after extensive discussion.   - General  - drink lot of water  - avoid all moderate and high glycemic foods especially bad fruits, breads, pastas, fried foods, battered foods, sugary foods (these are  the food in centerl and right lane that you have to avoid)  - make non-starchy vegetables your base in terms of volume you eat; 50% of what you see on the plate and goes into your mouth should be these vegetables (the vegetables in the left lane)  - always make sure you balance good carbs, good protein and good fat source  - good carbs are non-starchy vegetables, uncanned beans in the left column and low glycemic fruits in the left colum  - good protein source is egg white, beans, tofu, fish, chicken breast, fish, [place] and bison. Remember meat has to be skinless  - good healthy fat source is nuts, and fish   - focusing on eating right healthy foods (left lane) and avoiding unhealthy foods (middle and right lane) is better way to lose weight than to go hypo-caloric  - focus on staying full by eating right  - having a daily and weekly plan for what you will eat and where you will eat depending on your work, social life schedule is very important   - watch out for misleading labels on grocery aisle: High Fiber and Low Fat labeled foods generally are high in bad carbs or sugar  - measure weight once  a week  - discipline and attitude is key. Do not care for anyone else's opinion or feelings. Only yours matters   - For breakfast  - most important meal of the day. So, eat daily breakfast. Do not skip   - recommend 1/2 to 1 cup steel cut oat meal or 1/2 to 1 cup fiber one 60 cal   Or  1 to 1.5 cups Kashi go-lean with non-fat plain milk or 60 Cal Silk Soy mild. Can add Berries. Can have egg at same time for breakfast  - For snacks  - recommend total 2-3 snacks per day  - snack should be light and filling  - best times are between breakfast and lunch, lunch and dinner and sometimes post-dinner snack  - Nut are great snacks. Stick to low glycemic nuts (less than 50gm per day) and eat only the nuts in the left lane like peanuts, pista, almond, walnut  - Low glycemic fruits are great snacks. Have 1-2  servings each day of fruits from the left lane   - If you like yogurt or cottage cheese - recommend Oikos or Fage 0% greek yogourt or Plan non-fat yogurt or Breakstone non-fat cottage cheese. Theyse have the least sugar. Do no exceed 100-200 gram per day. Fruit yogurts are the worst  - Good Protein  Shakes are good snacks:  EAS Abbot Whey Powder shake, EAS Myoplex lite, EAS Carb Control. Muscle Milk Shake  - For Lunch and dinner  - unlimited non-starchy vegetable (prefer raw fresh or roasted or grilled) with skinless chicken or fish  - Special Notes  - Nuts: Nut are great snacks and have heart benefits. Stick to low glycemic nuts (less than 50gm per day) and eat only the nuts in the left lane like peanuts, pista, almond, walnuts  -  Ok to eat above nuts daily but only < 50gm/day  - If you eat more than 50gm/day then you run risk of eating too many calories or saturated fat  - AVoid nuts glazed with sugar. Nuts have to be in salted/original form or roasted   - Fruits: Eat 1-2 fresh fruit servings daily but fruits can be dangerous because of high sugar content. So, choose your fruits wisely. Eat only the low glycemic fruits (left lane). Eat them fresh.  Do not eat them canned  - Avoid all fresh juices except if you use the low glycemic fruits and make them yourself without adding extra sugar   - Dairy: Is optional. Eat zero fat or low fat, fruit free yogurts or cottage cheese but not more than 100-200g per day  - Restaurant  - all restaurants have bad and good choices. Even fast food restaurants offer you good choices  - at restaurants do no fall prey to social pressure.. One way to eat healthy at restaurant is to eat healthy snack or light healthy meal before you go to restaurant so that will prevent your cravings  - Restaurants with worst choices: [place] (except Chipotle, or [place]), [place], [place]. At these restaurants avoid the bread, curry, fried and battered foods and chips  -  Restaurants with best choices: greek, mid-east, [place], [place] (again here avoid bread, deep fried stuffed and pasta)  - Restaurants with Ok choice: McDonald's, [employer], [employer] (again here avoid the bread, fried stuff, fried meat)  - Always ask for grilled meat or vegetables, and fresh salad choices (get your salad dressing as low fat and to the side)  - For Will Power  - Prepare, prepare, prepare: Plan your day and think of what you will eat and when you will eat and where you will eat.  - Snack good stuff to keep yourself full to avoid hunger and losing will power  - 1 minute fast walk when feeling cravings could  Help  -  Eat breakfast  - Berries are excellent to control sugar cravings  - Proteins can prevent cravings  - Fats like nuts keep you full and prevent cravings  - Avoid hunger; stay ahead by eating small amounts of the right food  - Avoid hunger; stay ahead by eating frequently  - Drink lot of water  - Eat slower     Also helped hre register in myfitness pal app   > 50% of this > 25 min visit spent in face to face counseling or coordination of care    Dr. Brand Males, M.D., [facility].C.P Pulmonary and Critical Care Medicine Staff Physician [place] [facility] Pager: [phone], If no answer or between  15:00h - 7:00h: call [phone]  07/03/2015 5:55 PM

## 2015-07-03 NOTE — Patient Instructions (Signed)
ICD-9-CM ICD-10-CM   1. Obesity 278.00 E66.9     Follow our discussio and instruction  Followup  - April 2017 for copd and weight

## 2015-08-21 ENCOUNTER — Ambulatory Visit: Payer: Medicare HMO | Admitting: Internal Medicine

## 2015-08-26 ENCOUNTER — Telehealth: Payer: Self-pay | Admitting: Internal Medicine

## 2015-08-26 ENCOUNTER — Other Ambulatory Visit: Payer: Self-pay | Admitting: Internal Medicine

## 2015-08-26 DIAGNOSIS — J449 Chronic obstructive pulmonary disease, unspecified: Secondary | ICD-10-CM

## 2015-08-26 NOTE — Telephone Encounter (Signed)
Called and spoke with pt. She states she called and spoke with [facility]. They informed her that they no longer are handling her neb meds and that Huey Romans was going to contact our office to discuss what DME we would like the medications sent to. I explained to her that we have not received any information on this and that  I will contact [facility] to discuss the situation. She voiced understanding and had no further questions.  Called and spoke with Opal Sidles at [facility]. She states that [facility] consolidated two pharmacy into one out of [place] and states that the patient's insurance did not get approved to be covered under the new pharmacy. She states that due to this the pt will need to find another DME to send her medications too.   Called and spoke with the pt. Reviewed the above and informed her that the medication will be sent to a new DME. She is requesting to use [facility]. She voiced understanding and had no further questions. Pt is aware MR is out of the office and order can will be placed tomorrow when TP returns to the office. Will forward message to my inbox.

## 2015-08-27 ENCOUNTER — Other Ambulatory Visit: Payer: Self-pay

## 2015-08-27 ENCOUNTER — Other Ambulatory Visit: Payer: Self-pay | Admitting: Internal Medicine

## 2015-08-27 DIAGNOSIS — I10 Essential (primary) hypertension: Secondary | ICD-10-CM

## 2015-08-27 MED ORDER — CARVEDILOL 3.125 MG PO TABS: 3.1250 mg | ORAL_TABLET | Freq: Two times a day (BID) | ORAL | Status: DC

## 2015-08-28 MED ORDER — IPRATROPIUM-ALBUTEROL 0.5-2.5 (3) MG/3ML IN SOLN: RESPIRATORY_TRACT | Status: DC

## 2015-08-28 NOTE — Telephone Encounter (Signed)
Per verbal order from TP  Okay to send duoneb through [facility]  rx has been printed, signed, and placed at [facility] Order has been placed. Nothing further needed.

## 2015-09-01 ENCOUNTER — Telehealth: Payer: Self-pay | Admitting: Internal Medicine

## 2015-09-01 MED ORDER — IPRATROPIUM-ALBUTEROL 0.5-2.5 (3) MG/3ML IN SOLN: RESPIRATORY_TRACT | Status: DC

## 2015-09-01 NOTE — Telephone Encounter (Signed)
Patient called back regarding samples of nebulizer solution. She has no more left and is needing samples urgently. - Thanks

## 2015-09-01 NOTE — Telephone Encounter (Signed)
Spoke with pt and she states that she is almost out of Duoneb and thinks she may receive mail order by end of week. I offered to send Duoneb x1 week to local pharmacy for her to pick up. Pt requested rx be sent to [facility]. Rx sent. Nothing further needed.

## 2015-09-03 ENCOUNTER — Telehealth: Payer: Self-pay | Admitting: Internal Medicine

## 2015-09-03 DIAGNOSIS — J438 Other emphysema: Secondary | ICD-10-CM

## 2015-09-03 NOTE — Telephone Encounter (Signed)
Called spoke with pt. She states that she needs an order placed for a new neb machine though [facility]. She states that the [facility] neb machine will need to be returned due to the fact that she is switching DMEs. I explained to her that I would place the order. She voiced understanding and had no further questions. Order placed. Nothing further needed.

## 2015-09-03 NOTE — Telephone Encounter (Signed)
lmtcb X1 for pt  

## 2015-09-03 NOTE — Telephone Encounter (Signed)
Patient returning call, [phone].

## 2015-09-04 ENCOUNTER — Encounter: Payer: Self-pay | Admitting: Internal Medicine

## 2015-09-04 ENCOUNTER — Ambulatory Visit (INDEPENDENT_AMBULATORY_CARE_PROVIDER_SITE_OTHER): Payer: Medicare HMO | Admitting: Internal Medicine

## 2015-09-04 VITALS — BP 156/76 | HR 79 | Ht 64.5 in | Wt 212.2 lb

## 2015-09-04 DIAGNOSIS — Z129 Encounter for screening for malignant neoplasm, site unspecified: Secondary | ICD-10-CM | POA: Insufficient documentation

## 2015-09-04 DIAGNOSIS — J449 Chronic obstructive pulmonary disease, unspecified: Secondary | ICD-10-CM | POA: Insufficient documentation

## 2015-09-04 NOTE — Patient Instructions (Signed)
ICD-9-CM ICD-10-CM   1. COPD, severe ([place]) 496 J44.9   2. Cancer screening V76.9 Z12.9   3. Morbid obesity, unspecified obesity type ([place]) 278.01 E66.01   #a severe COPD - This is stable but you do have significant shortness of breath that could be because of COPD and weight - We will make sure the new DME company has prescription for a nebulizer machine - Continue nebulizers - We discussed option off participating in a COPD research protocol and I will make a referral - Do full pulmonary function test in 4 months -   #Cancer screening - We will refer you to a lung cancer screening navigator Sarah  #Obesity - Congratulations on the initial start of weight loss keep it up  #Follow-up -Do full pulmonary function test in 4 months (last one was in 2013] - Return to see me after 4 months  - Combination of repeat breathing test and CT scan could potentially reveal other causes of shortness of breath

## 2015-09-04 NOTE — Progress Notes (Signed)
Subjective:     Patient ID: Victoria [name], female   DOB: May 03, 1946, 70 y.o.   MRN: [id number]  HPI    OV 06/24/2015  Chief Complaint  Patient presents with  . Follow-up    Pt states she is getting over a cold. Pt c/o prod cough with light yellow mucus, increase in SOB x 1 week. Pt denies f/c/s and CP/tightness.    Gold stage III COPD patient on duo neb and Qvar. Noticed also be on Coreg nonspecific beta blocker.   She's had the flu shot but 10 days ago was exposed to her son who was confirmed to have influenza at an urgent care. A few days later developed sinus congestion and classic flu symptoms. Currently she is improved he did she has not taken any antibiotics or prednisone for the same. She never took any Tamiflu it as prophylaxis OR as treatment. However current improvement is only "somewhat". Still has significant residual symptoms. But he clearly having sinus headaches a lot worsening shortness of breath, chest tightness, wheezing and some yellow sputum  New problem:   - She reports she's been diagnosed with 2 cm abdominal aortic aneurysm. She is now trying to lose weight and wants help. She is asking for my help in this regard.    OV 09/04/2015   Chief Complaint  Patient presents with  . Follow-up    Pt states her breathing is overall the same. Pt c/o mild dry cough, pt contributes this to the seasonal allergies.     Follow-up   #Severe COPD: Overall stable. Continues on nebulizers. Changed DME company. Needs a prescription for nebulizer machine for the new DME company. My nurse tells me that she has already done that yesterday. Overall stable but patient still has class II-III dyspnea on exertion relieved by rest. This is despite pulmonary habitation. Last pulmonary function test 2013. Last CT chest 2015. No associated chest pain. She's also interested in research protocols. She's had 2 exacerbations of COPD in the last 1 year.  Obesity: Last visit we discussed weight  loss. She is doing somewhat of a 10 day detox initiation diet. With this she's lost 3-5 pounds. She is happy about it.  Cancer screening: She is interested in this.     has a past medical history of Asthma; Arthritis; Varicella (as child); Heart murmur; Hypertension; Hyperlipidemia; Tuberculosis (2009); COPD (chronic obstructive pulmonary disease) ([place]); and Murmur, cardiac (1950).   reports that she quit smoking about 4 years ago. Her smoking use included Cigarettes. She has a 20 pack-year smoking history. She quit smokeless tobacco use about 4 years ago.  Past Surgical History  Procedure Laterality Date  . Excision of pelvic absess, right ovary      2008  . Appendectomy      2008  . Cesarean section      1972    Allergies  Allergen Reactions  . Nickel Rash    Severe rash to infection: pt is allergic to all metals other than sterling silver or gold jewelry.   [name] [Olmesartan] Swelling    Swelling of face and arms   . [name] [Valsartan] Swelling    Swelling of face and arms   . Lisinopril Cough  . Codeine Other (See Comments)    jittery  . Monosodium Glutamate Other (See Comments)    Facial swelling per pt    Immunization History  Administered Date(s) Administered  . Influenza Split 01/16/2011  . Influenza,inj,Quad PF,36+ Mos 02/01/2013, 02/27/2014, 04/07/2015  .  Pneumococcal Conjugate-13 02/01/2013  . Pneumococcal Polysaccharide-23 10/29/2014  . Td 12/06/2011    Family History  Problem Relation Age of Onset  . Heart disease Mother     MI - fatal  . Hypertension Mother   . Stroke Father 31    fatal  . Alzheimer's disease Father   . Alzheimer's disease Brother   . Hyperlipidemia Brother   . Hypertension Brother   . Diabetes Brother   . Hypertension Brother   . Hyperlipidemia Brother      Current outpatient prescriptions:  .  albuterol (VENTOLIN HFA) 108 (90 BASE) MCG/ACT inhaler, Inhale 1 puff into the lungs every 6 (six) hours as needed for  wheezing., Disp: 18 each, Rfl: 5 .  amLODipine (NORVASC) 10 MG tablet, TAKE ONE TABLET BY MOUTH ONCE DAILY, Disp: 30 tablet, Rfl: 11 .  Aspirin-Calcium Carbonate (BAYER WOMENS) 81-300 MG TABS, Take 1 tablet by mouth daily with breakfast., Disp: , Rfl:  .  beclomethasone (QVAR) 80 MCG/ACT inhaler, Inhale 2 puffs into the lungs 2 (two) times daily., Disp: 4 Inhaler, Rfl: 0 .  carvedilol (COREG) 3.125 MG tablet, Take 1 tablet (3.125 mg total) by mouth 2 (two) times daily with a meal., Disp: 90 tablet, Rfl: 1 .  Cholecalciferol (VITAMIN D-3) 5000 UNITS TABS, Take 1 tablet by mouth once a week. , Disp: , Rfl:  .  hydrochlorothiazide (HYDRODIURIL) 25 MG tablet, Take 1 tablet (25 mg total) by mouth daily., Disp: 90 tablet, Rfl: 3 .  ipratropium-albuterol (DUONEB) 0.5-2.5 (3) MG/3ML SOLN, USE ONE VIAL VIA NEBULIZER 4 TIMES DAILY, Disp: 75 mL, Rfl: 0 .  loratadine-pseudoephedrine (CLARITIN-D 24-HOUR) 10-240 MG 24 hr tablet, Take 1 tablet by mouth daily., Disp: , Rfl:  .  lovastatin (MEVACOR) 20 MG tablet, TAKE ONE TABLET BY MOUTH ONCE DAILY, Disp: 30 tablet, Rfl: 11 .  naproxen (NAPROSYN) 500 MG tablet, Take 1 tablet (500 mg total) by mouth 2 (two) times daily with a meal., Disp: 30 tablet, Rfl: 0 .  OXYGEN, Inhale 2 L into the lungs at bedtime. Reported on 06/24/2015, Disp: , Rfl:  .  tiZANidine (ZANAFLEX) 4 MG capsule, TAKE ONE CAPSULE BY MOUTH THREE TIMES DAILY AS NEEDED FOR MUSCLE SPASM, Disp: 60 capsule, Rfl: 0    Review of Systems     Objective:   Physical Exam  Constitutional: She is oriented to person, place, and time. She appears well-developed and well-nourished. No distress.  HENT:  Head: Normocephalic and atraumatic.  Right Ear: External ear normal.  Left Ear: External ear normal.  Mouth/Throat: Oropharynx is clear and moist. No oropharyngeal exudate.  Eyes: Conjunctivae and EOM are normal. Pupils are equal, round, and reactive to light. Right eye exhibits no discharge. Left eye exhibits  no discharge. No scleral icterus.  Neck: Normal range of motion. Neck supple. No JVD present. No tracheal deviation present. No thyromegaly present.  Cardiovascular: Normal rate, regular rhythm, normal heart sounds and intact distal pulses.  Exam reveals no gallop and no friction rub.   No murmur heard. Pulmonary/Chest: Effort normal and breath sounds normal. No respiratory distress. She has no wheezes. She has no rales. She exhibits no tenderness.  Abdominal: Soft. Bowel sounds are normal. She exhibits no distension and no mass. There is no tenderness. There is no rebound and no guarding.  Musculoskeletal: Normal range of motion. She exhibits no edema or tenderness.  Lymphadenopathy:    She has no cervical adenopathy.  Neurological: She is alert and oriented to person, place, and  time. She has normal reflexes. No cranial nerve deficit. She exhibits normal muscle tone. Coordination normal.  Skin: Skin is warm and dry. No rash noted. She is not diaphoretic. No erythema. No pallor.  Psychiatric: She has a normal mood and affect. Her behavior is normal. Judgment and thought content normal.  Vitals reviewed.   Filed Vitals:   09/04/15 1402  BP: 156/76  Pulse: 79  Height: 5' 4.5" (1.638 m)  Weight: 212 lb 3.2 oz (96.253 kg)  SpO2: 94%   Estimated body mass index is 35.87 kg/(m^2) as calculated from the following:   Height as of this encounter: 5' 4.5" (1.638 m).   Weight as of this encounter: 212 lb 3.2 oz (96.253 kg).      Assessment:       ICD-9-CM ICD-10-CM   1. COPD, severe ([place]) 496 J44.9   2. Cancer screening V76.9 Z12.9   3. Morbid obesity, unspecified obesity type ([place]) 278.01 E66.01        Plan:     #a severe COPD - This is stable but you do have significant shortness of breath that could be because of COPD and weight - We will make sure the new DME company has prescription for a nebulizer machine - Continue nebulizers - We discussed option off participating in a COPD  research protocol and I will make a referral - Do full pulmonary function test in 4 months -   #Cancer screening - We will refer you to a lung cancer screening navigator Sarah  #Obesity - Congratulations on the initial start of weight loss keep it up  #Follow-up -Do full pulmonary function test in 4 months (last one was in 2013] - Return to see me after 4 months  - Combination of repeat breathing test and CT scan could potentially reveal other causes of shortness of breath   > 50% of this > 25 min visit spent in face to face counseling or coordination of care    Dr. Brand Males, M.D., [facility].C.P Pulmonary and Critical Care Medicine Staff Physician [place] [facility] Pager: [phone], If no answer or between  15:00h - 7:00h: call [phone]  09/04/2015 2:45 PM

## 2015-09-04 NOTE — Addendum Note (Signed)
Addended by: Collier Salina on: 09/04/2015 03:30 PM   Modules accepted: Orders

## 2015-09-08 ENCOUNTER — Other Ambulatory Visit: Payer: Self-pay

## 2015-09-08 ENCOUNTER — Telehealth: Payer: Self-pay | Admitting: Internal Medicine

## 2015-09-08 DIAGNOSIS — J069 Acute upper respiratory infection, unspecified: Secondary | ICD-10-CM

## 2015-09-08 MED ORDER — AZITHROMYCIN 250 MG PO TABS: ORAL_TABLET | ORAL | Status: DC

## 2015-09-08 MED ORDER — PREDNISONE 10 MG PO TABS: ORAL_TABLET | ORAL | Status: DC

## 2015-09-08 NOTE — Telephone Encounter (Signed)
Spoke with pt. States that she saw MR last week for her routine OV. Since Thursday she has been having issues with coughing and sinus congestion. Cough is producing green mucus. Wheezing and SOB are also present. Chest tightness and fever were denied. Pt can't come in for an appointment due to not having her copay at this time. Would like something sent in.  MR - please advise. Thanks.

## 2015-09-08 NOTE — Telephone Encounter (Signed)
Form received & gave to [facility] for TP.

## 2015-09-08 NOTE — Telephone Encounter (Signed)
Spoke with pt and gave recommendations. Pt would like rx's sent to [facility]. Rx's sent. Nothing further needed.

## 2015-09-08 NOTE — Telephone Encounter (Signed)
Prednisone 10 mg take  4 each am x 2 days,   2 each am x 2 days,  1 each am x 2 days and stop zpak 

## 2015-09-08 NOTE — Telephone Encounter (Signed)
Forms have been signed and faxed back to [phone]. Nothing further needed.

## 2015-09-08 NOTE — Telephone Encounter (Signed)
Spoke to [employer] at [employer]. They are needing their Medicare billing form completed.  She is going to refax the form.

## 2015-09-08 NOTE — Telephone Encounter (Signed)
Routed to [facility] for follow up.

## 2015-09-08 NOTE — Telephone Encounter (Signed)
MR called and stated that he can't do this message due to being night float. Will send to DOD.  MW - please advise. Thanks.

## 2015-09-24 ENCOUNTER — Other Ambulatory Visit: Payer: Self-pay | Admitting: Acute Care

## 2015-09-24 DIAGNOSIS — Z87891 Personal history of nicotine dependence: Secondary | ICD-10-CM

## 2015-09-25 ENCOUNTER — Other Ambulatory Visit: Payer: Self-pay | Admitting: Internal Medicine

## 2015-10-08 ENCOUNTER — Telehealth: Payer: Self-pay | Admitting: Internal Medicine

## 2015-10-08 DIAGNOSIS — Z9289 Personal history of other medical treatment: Secondary | ICD-10-CM | POA: Insufficient documentation

## 2015-10-08 NOTE — Telephone Encounter (Signed)
Will send to MR to make aware that we attempted to contact the patient again for him. LMTCB

## 2015-10-08 NOTE — Telephone Encounter (Signed)
Spoke with patient- aware that order placed for blood test and she can come before her appts to have this drawn or have it done after her appts - either way is fine.  Order placed for Victoria [name]. Nothing further needed.

## 2015-10-08 NOTE — Telephone Encounter (Signed)
LM x 1 for patient to make aware of the below per MR

## 2015-10-08 NOTE — Telephone Encounter (Signed)
Was reviewing .Victoria [name] chart and noticed hx of positive ppd in 2009 with clear cxr but did not get INH  6 month (All this at [employer]). So given her copd hx and age 70 - please order Quantiferon Gold blood test. This is not a reseach procedure but standard of care procedure - indication: hx of positive PPD. I placed order  I LMTCB -you can call her. SHe happens to come tomorrow at 9am for [facility] screening visit for reseaerch and 10am to see Eric Form for shared decision making (10/09/15) - she can do it after that. Please note that the blood test is NOT a research procedure but standard of care procedure  Sending to triage due to time sensitivity  Dr. Brand Males, M.D., [facility].C.P Pulmonary and Critical Care Medicine Staff Physician [facility] Pager: [phone], If no answer or between  15:00h - 7:00h: call [phone]  10/08/2015 11:47 AM

## 2015-10-08 NOTE — Telephone Encounter (Signed)
Patient states Dr. Chase Caller called her today and advised of testing and speaking with Eric Form.  She would like to get this done tomorrow if she can.  CB is [phone]

## 2015-10-09 ENCOUNTER — Ambulatory Visit (INDEPENDENT_AMBULATORY_CARE_PROVIDER_SITE_OTHER)
Admission: RE | Admit: 2015-10-09 | Discharge: 2015-10-09 | Disposition: A | Discharge status: Home / Self Care | Payer: Medicare HMO | Source: Ambulatory Visit | Attending: Acute Care | Admitting: Acute Care

## 2015-10-09 ENCOUNTER — Other Ambulatory Visit: Payer: Medicare HMO

## 2015-10-09 ENCOUNTER — Ambulatory Visit (INDEPENDENT_AMBULATORY_CARE_PROVIDER_SITE_OTHER): Payer: Medicare HMO | Admitting: Acute Care

## 2015-10-09 ENCOUNTER — Encounter: Payer: Self-pay | Admitting: Acute Care

## 2015-10-09 DIAGNOSIS — Z87891 Personal history of nicotine dependence: Secondary | ICD-10-CM

## 2015-10-09 DIAGNOSIS — Z9289 Personal history of other medical treatment: Secondary | ICD-10-CM

## 2015-10-09 NOTE — Progress Notes (Signed)
Shared Decision Making Visit Lung Cancer Screening Program [phone])   Eligibility:  Age 70 y.o.  Pack Years Smoking History Calculation 50 pack years (# packs/per year x # years smoked)  Recent History of coughing up blood  no  Unexplained weight loss? no ( >Than 15 pounds within the last 6 months )  Prior History Lung / other cancer no (Diagnosis within the last 5 years already requiring surveillance chest CT Scans).  Smoking Status Former Smoker  Former Smokers: Years since quit: 5 years  Quit Date: 04/16/2011  Visit Components:  Discussion included one or more decision making aids. yes  Discussion included risk/benefits of screening. yes  Discussion included potential follow up diagnostic testing for abnormal scans. yes  Discussion included meaning and risk of over diagnosis. yes  Discussion included meaning and risk of False Positives. yes  Discussion included meaning of total radiation exposure. yes  Counseling Included:  Importance of adherence to annual lung cancer LDCT screening. yes  Impact of comorbidities on ability to participate in the program. yes  Ability and willingness to under diagnostic treatment. yes  Smoking Cessation Counseling:  Current Smokers:   Discussed importance of smoking cessation.  Not Applicable/ Former smoker  Information about tobacco cessation classes and interventions provided to patient. {NA  Patient provided with "ticket" for LDCT Scan. yes  Symptomatic Patient. no  CounselingNA  Diagnosis Code: Tobacco Use Z72.0  Asymptomatic Patient yes  Counseling (Intermediate counseling: > three minutes counseling) [id number]  Former Smokers:   Discussed the importance of maintaining cigarette abstinence. yes  Diagnosis Code: Personal History of Nicotine Dependence. [id number]  Information about tobacco cessation classes and interventions provided to patient. Yes  Patient provided with "ticket" for LDCT Scan. yes  Written  Order for Lung Cancer Screening with LDCT placed in Epic. Yes (CT Chest Lung Cancer Screening Low Dose W/O CM) [id number] Z12.2-Screening of respiratory organs Z87.891-Personal history of nicotine dependence  I spent 15 minutes of face to face time with Ms. Kendricks discussing the risks and benefits of lung cancer screening. We viewed a power point together that explained in detail the above noted topics. We took the time to pause the power point at intervals to allow for questions to be asked and answered to ensure understanding. We discussed that she had taken the single most powerful action possible to decrease her risk of developing lung cancer when she quit smoking. I counseled her to remain smoke free, and to contact me if she ever had the desire to smoke again so that I can provide resources and tools to help support the effort to remain smoke free. We discussed the time and location of the scan, and that either [place] or I will call with the results within  24-48 hours of receiving them. She  has my card and contact information in the event she needs to speak with me, in addition to a copy of the power point we reviewed as a resource. She verbalized understanding of all of the above and had no further questions upon leaving the office.   Magdalen Spatz, NP  10/09/2015

## 2015-10-11 LAB — QUANTIFERON TB GOLD ASSAY (BLOOD)
INTERFERON GAMMA RELEASE ASSAY: NEGATIVE
Quantiferon Nil Value: 0.09 IU/mL
Quantiferon Tb Ag Minus Nil Value: <0 IU/mL

## 2015-10-14 ENCOUNTER — Encounter: Payer: Self-pay | Admitting: Internal Medicine

## 2015-10-14 ENCOUNTER — Other Ambulatory Visit (INDEPENDENT_AMBULATORY_CARE_PROVIDER_SITE_OTHER): Payer: Medicare HMO

## 2015-10-14 ENCOUNTER — Telehealth: Payer: Self-pay | Admitting: Internal Medicine

## 2015-10-14 ENCOUNTER — Ambulatory Visit (INDEPENDENT_AMBULATORY_CARE_PROVIDER_SITE_OTHER): Payer: Medicare HMO | Admitting: Internal Medicine

## 2015-10-14 VITALS — BP 132/70 | HR 78 | Temp 97.8°F | Ht 64.5 in | Wt 219.0 lb

## 2015-10-14 DIAGNOSIS — Z1159 Encounter for screening for other viral diseases: Secondary | ICD-10-CM

## 2015-10-14 DIAGNOSIS — I10 Essential (primary) hypertension: Secondary | ICD-10-CM

## 2015-10-14 DIAGNOSIS — Z0000 Encounter for general adult medical examination without abnormal findings: Secondary | ICD-10-CM | POA: Diagnosis not present

## 2015-10-14 DIAGNOSIS — Z1231 Encounter for screening mammogram for malignant neoplasm of breast: Secondary | ICD-10-CM | POA: Insufficient documentation

## 2015-10-14 DIAGNOSIS — I714 Abdominal aortic aneurysm, without rupture, unspecified: Secondary | ICD-10-CM

## 2015-10-14 DIAGNOSIS — E785 Hyperlipidemia, unspecified: Secondary | ICD-10-CM

## 2015-10-14 DIAGNOSIS — J449 Chronic obstructive pulmonary disease, unspecified: Secondary | ICD-10-CM

## 2015-10-14 DIAGNOSIS — I2510 Atherosclerotic heart disease of native coronary artery without angina pectoris: Secondary | ICD-10-CM | POA: Insufficient documentation

## 2015-10-14 DIAGNOSIS — R0609 Other forms of dyspnea: Secondary | ICD-10-CM

## 2015-10-14 DIAGNOSIS — R9431 Abnormal electrocardiogram [ECG] [EKG]: Secondary | ICD-10-CM

## 2015-10-14 DIAGNOSIS — R0600 Dyspnea, unspecified: Secondary | ICD-10-CM

## 2015-10-14 DIAGNOSIS — E042 Nontoxic multinodular goiter: Secondary | ICD-10-CM

## 2015-10-14 LAB — COMPREHENSIVE METABOLIC PANEL
ALBUMIN: 4 g/dL (ref 3.5–5.2)
ALK PHOS: 76 U/L (ref 39–117)
ALT: 5 U/L (ref 0–35)
AST: 10 U/L (ref 0–37)
BILIRUBIN TOTAL: 0.4 mg/dL (ref 0.2–1.2)
BUN: 14 mg/dL (ref 6–23)
CO2: 33 mEq/L — ABNORMAL HIGH (ref 19–32)
Calcium: 9.8 mg/dL (ref 8.4–10.5)
Chloride: 102 mEq/L (ref 96–112)
Creatinine, Ser: 0.76 mg/dL (ref 0.40–1.20)
GFR: 96.29 mL/min (ref >60.00)
GLUCOSE: 107 mg/dL — AB (ref 70–99)
Potassium: 4.1 mEq/L (ref 3.5–5.1)
Sodium: 138 mEq/L (ref 135–145)
TOTAL PROTEIN: 6.9 g/dL (ref 6.0–8.3)

## 2015-10-14 LAB — CBC WITH DIFFERENTIAL/PLATELET
BASOS ABS: 0 10*3/uL (ref 0.0–0.1)
Basophils Relative: 0.3 % (ref 0.0–3.0)
Eosinophils Absolute: 0.3 10*3/uL (ref 0.0–0.7)
Eosinophils Relative: 3.2 % (ref 0.0–5.0)
HCT: 41.1 % (ref 36.0–46.0)
Hemoglobin: 13.6 g/dL (ref 12.0–15.0)
LYMPHS ABS: 2 10*3/uL (ref 0.7–4.0)
Lymphocytes Relative: 20.7 % (ref 12.0–46.0)
MCHC: 33.1 g/dL (ref 30.0–36.0)
MCV: 85.1 fl (ref 78.0–100.0)
MONO ABS: 0.6 10*3/uL (ref 0.1–1.0)
MONOS PCT: 6.8 % (ref 3.0–12.0)
NEUTROS PCT: 69 % (ref 43.0–77.0)
Neutro Abs: 6.6 10*3/uL (ref 1.4–7.7)
Platelets: 254 10*3/uL (ref 150.0–400.0)
RBC: 4.82 Mil/uL (ref 3.87–5.11)
RDW: 14.8 % (ref 11.5–15.5)
WBC: 9.6 10*3/uL (ref 4.0–10.5)

## 2015-10-14 LAB — LIPID PANEL
Cholesterol: 201 mg/dL — ABNORMAL HIGH (ref 0–200)
HDL: 61.3 mg/dL (ref >39.00)
LDL Cholesterol: 123 mg/dL — ABNORMAL HIGH (ref 0–99)
NONHDL: 139.25
Total CHOL/HDL Ratio: 3
Triglycerides: 83 mg/dL (ref 0.0–149.0)
VLDL: 16.6 mg/dL (ref 0.0–40.0)

## 2015-10-14 LAB — T4, FREE: FREE T4: 0.83 ng/dL (ref 0.60–1.60)

## 2015-10-14 LAB — HEPATITIS C ANTIBODY: HCV Ab: NEGATIVE

## 2015-10-14 LAB — T3: T3 TOTAL: 94 ng/dL (ref 76–181)

## 2015-10-14 LAB — TSH: TSH: 0.47 u[IU]/mL (ref 0.35–4.50)

## 2015-10-14 MED ORDER — AMLODIPINE BESYLATE 10 MG PO TABS: 10.0000 mg | ORAL_TABLET | Freq: Every day | ORAL | Status: DC

## 2015-10-14 MED ORDER — ALBUTEROL SULFATE HFA 108 (90 BASE) MCG/ACT IN AERS: 1.0000 | INHALATION_SPRAY | Freq: Four times a day (QID) | RESPIRATORY_TRACT | Status: DC | PRN

## 2015-10-14 MED ORDER — LOVASTATIN 20 MG PO TABS: 20.0000 mg | ORAL_TABLET | Freq: Every day | ORAL | Status: DC

## 2015-10-14 MED ORDER — HYDROCHLOROTHIAZIDE 25 MG PO TABS: 25.0000 mg | ORAL_TABLET | Freq: Every day | ORAL | Status: DC

## 2015-10-14 NOTE — Progress Notes (Signed)
Subjective:  Patient ID: Victoria [name], female    DOB: 15-Oct-1945  Age: 70 y.o. MRN: [id number]  CC: Annual Exam; Hyperlipidemia; and COPD   HPI [name] presents for a CPX.  She complains of worsening dyspnea on exertion over the last 2 weeks. She is also concerned about a CT scan that she had done recently. It was ordered by pulmonary to assess her risk for lung cancer. The lungs were normal however the CT scan revealed coronary atherosclerosis in 2 coronary arteries. She had a normal thallium scan about 7 years ago. She denies chest pain, diaphoresis, dizziness, fatigue, or near-syncope. She does struggle with mild edema in her lower extremities is that is worsened whenever she consumes alcohol or high quantities of sodium.  She tells me her blood pressure has been well controlled on the combination of hydrochlorothiazide and amlodipine.   Past Medical History  Diagnosis Date  . Asthma     uses advair   . Arthritis     left ankle, right knee, right SI joint, wrists  . Varicella as child  . 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 Hypertension   . Hyperlipidemia   . Tuberculosis 2009    h/o positive PPD- [facility], had  normal chest x-ray. she was not treated for a  new conversion.  [name] COPD (chronic obstructive pulmonary disease) ([place])   . Murmur, cardiac 1950   Past Surgical History  Procedure Laterality Date  . Excision of pelvic absess, right ovary      2008  . Appendectomy      2008  . Cesarean section      1972    reports that she quit smoking about 4 years ago. Her smoking use included Cigarettes. She has a 50 pack-year smoking history. She quit smokeless tobacco use about 4 years ago. She reports that she drinks alcohol. She reports that she does not use illicit drugs. family history includes Alzheimer's disease in her brother and father; Diabetes in her brother; Heart disease in her mother; Hyperlipidemia in her brother and brother;  Hypertension in her brother, brother, and mother; Stroke (age of onset: 13) in her father. Allergies  Allergen Reactions  . Nickel Rash    Severe rash to infection: pt is allergic to all metals other than sterling silver or gold jewelry.   [name] [Olmesartan] Swelling    Swelling of face and arms   . [name] [Valsartan] Swelling    Swelling of face and arms   . Lisinopril Cough  . Codeine Other (See Comments)    jittery  . Monosodium Glutamate Other (See Comments)    Facial swelling per pt    Outpatient Prescriptions Prior to Visit  Medication Sig Dispense Refill  . Aspirin-Calcium Carbonate (BAYER WOMENS) 81-300 MG TABS Take 1 tablet by mouth daily with breakfast.    . beclomethasone (QVAR) 80 MCG/ACT inhaler Inhale 2 puffs into the lungs 2 (two) times daily. 4 Inhaler 0  . carvedilol (COREG) 3.125 MG tablet Take 1 tablet (3.125 mg total) by mouth 2 (two) times daily with a meal. 90 tablet 1  . Cholecalciferol (VITAMIN D-3) 5000 UNITS TABS Take 1 tablet by mouth once a week.     [name] ipratropium-albuterol (DUONEB) 0.5-2.5 (3) MG/3ML SOLN USE ONE VIAL VIA NEBULIZER 4 TIMES DAILY 75 mL 0  . OXYGEN Inhale 2 L into the lungs at bedtime. Reported on 06/24/2015    .  albuterol (VENTOLIN HFA) 108 (90 BASE) MCG/ACT inhaler Inhale 1 puff into the lungs every 6 (six) hours as needed for wheezing. 18 each 5  . amLODipine (NORVASC) 10 MG tablet TAKE ONE TABLET BY MOUTH ONCE DAILY 30 tablet 11  . hydrochlorothiazide (HYDRODIURIL) 25 MG tablet TAKE ONE TABLET BY MOUTH ONCE DAILY 30 tablet 0  . lovastatin (MEVACOR) 20 MG tablet TAKE ONE TABLET BY MOUTH ONCE DAILY 30 tablet 11  . loratadine-pseudoephedrine (CLARITIN-D 24-HOUR) 10-240 MG 24 hr tablet Take 1 tablet by mouth daily. Reported on 10/14/2015    . azithromycin (ZITHROMAX) 250 MG tablet TAKE AS DIRECTED 6 tablet 0  . naproxen (NAPROSYN) 500 MG tablet Take 1 tablet (500 mg total) by mouth 2 (two) times daily with a meal. 30 tablet 0  . predniSONE  (DELTASONE) 10 MG tablet Take 4 tabs x 2 days, 2 tabs x 2 days, 1 tab x 2 days and stop 14 tablet 0  . tiZANidine (ZANAFLEX) 4 MG capsule TAKE ONE CAPSULE BY MOUTH THREE TIMES DAILY AS NEEDED FOR MUSCLE SPASM 60 capsule 0   No facility-administered medications prior to visit.    ROS Review of Systems  Constitutional: Negative.  Negative for fever, chills, diaphoresis, appetite change and fatigue.  HENT: Negative.   Eyes: Negative.  Negative for visual disturbance.  Respiratory: Positive for shortness of breath and wheezing. Negative for cough, choking, chest tightness and stridor.   Cardiovascular: Negative.  Negative for chest pain, palpitations and leg swelling.  Gastrointestinal: Negative.  Negative for nausea, vomiting, abdominal pain, diarrhea and constipation.  Endocrine: Negative.   Genitourinary: Negative.  Negative for dysuria, urgency, frequency, hematuria, decreased urine volume and difficulty urinating.  Musculoskeletal: Negative.  Negative for myalgias, back pain, joint swelling, arthralgias and neck pain.  Skin: Negative.  Negative for color change and rash.  Allergic/Immunologic: Negative.   Neurological: Negative.  Negative for dizziness, syncope, speech difficulty, weakness, light-headedness, numbness and headaches.  Hematological: Negative.  Negative for adenopathy. Does not bruise/bleed easily.  Psychiatric/Behavioral: Negative.     Objective:  BP 132/70 mmHg  Pulse 78  Temp(Src) 97.8 F (36.6 C) (Oral)  Ht 5' 4.5" (1.638 m)  Wt 219 lb (99.338 kg)  BMI 37.02 kg/m2  SpO2 91%  BP Readings from Last 3 Encounters:  10/14/15 132/70  09/04/15 156/76  07/03/15 152/76    Wt Readings from Last 3 Encounters:  10/14/15 219 lb (99.338 kg)  09/04/15 212 lb 3.2 oz (96.253 kg)  07/03/15 216 lb 6.4 oz (98.158 kg)    Physical Exam  Constitutional: She is oriented to person, place, and time. She appears well-developed and well-nourished.  Non-toxic appearance. She does  not have a sickly appearance. She does not appear ill. No distress.  HENT:  Mouth/Throat: Oropharynx is clear and moist. No oropharyngeal exudate.  Eyes: Conjunctivae are normal. Right eye exhibits no discharge. Left eye exhibits no discharge. No scleral icterus.  Neck: Normal range of motion. Neck supple. No JVD present. No tracheal deviation present. No thyromegaly present.  Cardiovascular: Normal rate, regular rhythm, normal heart sounds and intact distal pulses.  Exam reveals no gallop and no friction rub.   No murmur heard. EKG ----  Sinus  Rhythm  WITHIN NORMAL LIMITS  Pulmonary/Chest: Effort normal and breath sounds normal. No stridor. No respiratory distress. She has no wheezes. She has no rales. She exhibits no tenderness.  Abdominal: Soft. Bowel sounds are normal. She exhibits no distension and no mass. There is no tenderness. There  is no rebound and no guarding.  Musculoskeletal: Normal range of motion. She exhibits edema (trace pitting edema in BLE). She exhibits no tenderness.  Lymphadenopathy:    She has no cervical adenopathy.  Neurological: She is oriented to person, place, and time.  Skin: Skin is warm and dry. No rash noted. She is not diaphoretic. No erythema. No pallor.  Vitals reviewed.   Lab Results  Component Value Date   WBC 9.6 10/14/2015   HGB 13.6 10/14/2015   HCT 41.1 10/14/2015   PLT 254.0 10/14/2015   GLUCOSE 107* 10/14/2015   CHOL 201* 10/14/2015   TRIG 83.0 10/14/2015   HDL 61.30 10/14/2015   LDLDIRECT 121.0 09/05/2014   LDLCALC 123* 10/14/2015   ALT 5 10/14/2015   AST 10 10/14/2015   NA 138 10/14/2015   K 4.1 10/14/2015   CL 102 10/14/2015   CREATININE 0.76 10/14/2015   BUN 14 10/14/2015   CO2 33* 10/14/2015   TSH 0.47 10/14/2015   INR 1.01 05/28/2014    Ct Chest Lung Ca Screen Low Dose W/o Cm  10/09/2015  CLINICAL DATA:  70 year old female former smoker (quit 5 years prior) with a 50 pack-year smoking history. EXAM: CT CHEST WITHOUT  CONTRAST LOW-DOSE FOR LUNG CANCER SCREENING TECHNIQUE: Multidetector CT imaging of the chest was performed following the standard protocol without IV contrast. COMPARISON:  03/05/2014 routine unenhanced chest CT. FINDINGS: Mediastinum/Nodes: Normal heart size. No pericardial fluid/thickening. Left main, left anterior descending and right coronary atherosclerosis. Atherosclerotic nonaneurysmal thoracic aorta. Top-normal caliber pulmonary arteries. Normal visualized thyroid. Normal esophagus. No pathologically enlarged axillary, mediastinal or gross hilar lymph nodes, noting limited sensitivity for the detection of hilar adenopathy on this noncontrast study. Lungs/Pleura: No pneumothorax. No pleural effusion. Mild-to-moderate centrilobular emphysema with diffuse bronchial wall thickening. Parenchymal bands in the lingula, medial right middle lobe and bilateral lower lobes. No acute consolidative airspace disease. Scattered pulmonary nodules measuring up to 3.6 mm in volume derived mean diameter in the anterior right lower lobe (series 3/image 189). Upper abdomen: Stable left adrenal 3.0 cm adenoma with density 1 HU. Musculoskeletal: No aggressive appearing focal osseous lesions. Mild degenerative changes in the thoracic spine. IMPRESSION: 1. Lung-RADS Category 2S, benign appearance or behavior. Continue annual screening with low-dose chest CT without contrast in 12 months 2. The "S" modifier above refers to potentially clinically significant non lung cancer related findings. Specifically, left main and two-vessel coronary atherosclerosis, mild to moderate emphysema and left adrenal adenoma. Electronically Signed   By: Ilona Sorrel M.D.   On: 10/09/2015 15:08    Assessment & Plan:   Lydiana was seen today for annual exam, hyperlipidemia and copd.  Diagnoses and all orders for this visit:  Hyperlipidemia with target LDL less than 130- she has achieved her LDL goal is doing well on the statin -     Lipid panel;  Future -     Comprehensive metabolic panel; Future -     lovastatin (MEVACOR) 20 MG tablet; Take 1 tablet (20 mg total) by mouth daily.  Nontoxic multinodular goiter- palpation of the thyroid gland is normal today, she doesn't have any clinical signs of thyroid disease and her thyroid function studies are normal today. -     TSH; Future -     T4, free; Future -     T3; Future  Essential hypertension- her blood pressures well controlled, electrolytes and renal function are stable. -     Comprehensive metabolic panel; Future -     CBC  with Differential/Platelet; Future -     amLODipine (NORVASC) 10 MG tablet; Take 1 tablet (10 mg total) by mouth daily. -     hydrochlorothiazide (HYDRODIURIL) 25 MG tablet; Take 1 tablet (25 mg total) by mouth daily.  AAA (abdominal aortic aneurysm) without rupture ([place])- this is followed by vascular surgery and is currently not symptomatic or large enough to require surgical intervention -     Lipid panel; Future -     lovastatin (MEVACOR) 20 MG tablet; Take 1 tablet (20 mg total) by mouth daily.  Need for hepatitis C screening test -     Hepatitis C antibody; Future  Nonspecific abnormal electrocardiogram (ECG) (EKG) -     EKG 12-Lead  COPD, severe (HCC) -     albuterol (VENTOLIN HFA) 108 (90 Base) MCG/ACT inhaler; Inhale 1 puff into the lungs every 6 (six) hours as needed for wheezing. -     EKG 12-Lead  Visit for screening mammogram -     MM DIGITAL SCREENING BILATERAL; Future  DOE (dyspnea on exertion)- her EKG is normal today but there was some coronary atherosclerosis on a recent CT scan, I've asked her to undergo an exercise tolerance test, she does have severe lung disease so if she cannot complete an exercise tolerance test then I may consider ordering another Lexiscan. -     EXERCISE TOLERANCE TEST; Future  Atherosclerosis of native coronary artery of native heart without angina pectoris- she has shortness of breath and dyspnea on exertion  but I think this is most likely related to her lung disease, she does not have any symptoms suspicious for angina, will continue risk factor modification with blood pressure control, aspirin therapy, statin therapy, and LDL reduction. I've asked her to undergo an exercise tolerance test to screen for ischemia. -     EXERCISE TOLERANCE TEST; Future  Other orders -     Cancel: beclomethasone (QVAR) 80 MCG/ACT inhaler; Inhale 2 puffs into the lungs 2 (two) times daily.   I have discontinued Ms. Hartless naproxen, tiZANidine, azithromycin, and predniSONE. I have also changed her amLODipine, hydrochlorothiazide, and lovastatin. Additionally, I am having her maintain her Vitamin D-3, Aspirin-Calcium Carbonate, OXYGEN, beclomethasone, carvedilol, ipratropium-albuterol, loratadine-pseudoephedrine, and albuterol.  Meds ordered this encounter  Medications  . amLODipine (NORVASC) 10 MG tablet    Sig: Take 1 tablet (10 mg total) by mouth daily.    Dispense:  90 tablet    Refill:  1  . albuterol (VENTOLIN HFA) 108 (90 Base) MCG/ACT inhaler    Sig: Inhale 1 puff into the lungs every 6 (six) hours as needed for wheezing.    Dispense:  18 each    Refill:  5  . hydrochlorothiazide (HYDRODIURIL) 25 MG tablet    Sig: Take 1 tablet (25 mg total) by mouth daily.    Dispense:  90 tablet    Refill:  1  . lovastatin (MEVACOR) 20 MG tablet    Sig: Take 1 tablet (20 mg total) by mouth daily.    Dispense:  90 tablet    Refill:  3   See AVS for instructions about healthy living and anticipatory guidance.  Follow-up: Return in about 6 months (around 04/15/2016).  Scarlette Calico, MD

## 2015-10-14 NOTE — Patient Instructions (Signed)
Preventive Care for Adults, Female A healthy lifestyle and preventive care can promote health and wellness. Preventive health guidelines for women include the following key practices.  A routine yearly physical is a good way to check with your health care provider about your health and preventive screening. It is a chance to share any concerns and updates on your health and to receive a thorough exam.  Visit your dentist for a routine exam and preventive care every 6 months. Brush your teeth twice a day and floss once a day. Good oral hygiene prevents tooth decay and gum disease.  The frequency of eye exams is based on your age, health, family medical history, use of contact lenses, and other factors. Follow your health care provider's recommendations for frequency of eye exams.  Eat a healthy diet. Foods like vegetables, fruits, whole grains, low-fat dairy products, and lean protein foods contain the nutrients you need without too many calories. Decrease your intake of foods high in solid fats, added sugars, and salt. Eat the right amount of calories for you.Get information about a proper diet from your health care provider, if necessary.  Regular physical exercise is one of the most important things you can do for your health. Most adults should get at least 150 minutes of moderate-intensity exercise (any activity that increases your heart rate and causes you to sweat) each week. In addition, most adults need muscle-strengthening exercises on 2 or more days a week.  Maintain a healthy weight. The body mass index (BMI) is a screening tool to identify possible weight problems. It provides an estimate of body fat based on height and weight. Your health care provider can find your BMI and can help you achieve or maintain a healthy weight.For adults 20 years and older:  A BMI below 18.5 is considered underweight.  A BMI of 18.5 to 24.9 is normal.  A BMI of 25 to 29.9 is considered overweight.  A  BMI of 30 and above is considered obese.  Maintain normal blood lipids and cholesterol levels by exercising and minimizing your intake of saturated fat. Eat a balanced diet with plenty of fruit and vegetables. Blood tests for lipids and cholesterol should begin at age 45 and be repeated every 5 years. If your lipid or cholesterol levels are high, you are over 50, or you are at high risk for heart disease, you may need your cholesterol levels checked more frequently.Ongoing high lipid and cholesterol levels should be treated with medicines if diet and exercise are not working.  If you smoke, find out from your health care provider how to quit. If you do not use tobacco, do not start.  Lung cancer screening is recommended for adults aged 45-80 years who are at high risk for developing lung cancer because of a history of smoking. A yearly low-dose CT scan of the lungs is recommended for people who have at least a 30-pack-year history of smoking and are a current smoker or have quit within the past 15 years. A pack year of smoking is smoking an average of 1 pack of cigarettes a day for 1 year (for example: 1 pack a day for 30 years or 2 packs a day for 15 years). Yearly screening should continue until the smoker has stopped smoking for at least 15 years. Yearly screening should be stopped for people who develop a health problem that would prevent them from having lung cancer treatment.  If you are pregnant, do not drink alcohol. If you are  breastfeeding, be very cautious about drinking alcohol. If you are not pregnant and choose to drink alcohol, do not have more than 1 drink per day. One drink is considered to be 12 ounces (355 mL) of beer, 5 ounces (148 mL) of wine, or 1.5 ounces (44 mL) of liquor.  Avoid use of street drugs. Do not share needles with anyone. Ask for help if you need support or instructions about stopping the use of drugs.  High blood pressure causes heart disease and increases the risk  of stroke. Your blood pressure should be checked at least every 1 to 2 years. Ongoing high blood pressure should be treated with medicines if weight loss and exercise do not work.  If you are 55-79 years old, ask your health care provider if you should take aspirin to prevent strokes.  Diabetes screening is done by taking a blood sample to check your blood glucose level after you have not eaten for a certain period of time (fasting). If you are not overweight and you do not have risk factors for diabetes, you should be screened once every 3 years starting at age 45. If you are overweight or obese and you are 40-70 years of age, you should be screened for diabetes every year as part of your cardiovascular risk assessment.  Breast cancer screening is essential preventive care for women. You should practice "breast self-awareness." This means understanding the normal appearance and feel of your breasts and may include breast self-examination. Any changes detected, no matter how small, should be reported to a health care provider. Women in their 20s and 30s should have a clinical breast exam (CBE) by a health care provider as part of a regular health exam every 1 to 3 years. After age 40, women should have a CBE every year. Starting at age 40, women should consider having a mammogram (breast X-ray test) every year. Women who have a family history of breast cancer should talk to their health care provider about genetic screening. Women at a high risk of breast cancer should talk to their health care providers about having an MRI and a mammogram every year.  Breast cancer gene (BRCA)-related cancer risk assessment is recommended for women who have family members with BRCA-related cancers. BRCA-related cancers include breast, ovarian, tubal, and peritoneal cancers. Having family members with these cancers may be associated with an increased risk for harmful changes (mutations) in the breast cancer genes BRCA1 and  BRCA2. Results of the assessment will determine the need for genetic counseling and BRCA1 and BRCA2 testing.  Your health care provider may recommend that you be screened regularly for cancer of the pelvic organs (ovaries, uterus, and vagina). This screening involves a pelvic examination, including checking for microscopic changes to the surface of your cervix (Pap test). You may be encouraged to have this screening done every 3 years, beginning at age 21.  For women ages 30-65, health care providers may recommend pelvic exams and Pap testing every 3 years, or they may recommend the Pap and pelvic exam, combined with testing for human papilloma virus (HPV), every 5 years. Some types of HPV increase your risk of cervical cancer. Testing for HPV may also be done on women of any age with unclear Pap test results.  Other health care providers may not recommend any screening for nonpregnant women who are considered low risk for pelvic cancer and who do not have symptoms. Ask your health care provider if a screening pelvic exam is right for   you.  If you have had past treatment for cervical cancer or a condition that could lead to cancer, you need Pap tests and screening for cancer for at least 20 years after your treatment. If Pap tests have been discontinued, your risk factors (such as having a new sexual partner) need to be reassessed to determine if screening should resume. Some women have medical problems that increase the chance of getting cervical cancer. In these cases, your health care provider may recommend more frequent screening and Pap tests.  Colorectal cancer can be detected and often prevented. Most routine colorectal cancer screening begins at the age of 50 years and continues through age 75 years. However, your health care provider may recommend screening at an earlier age if you have risk factors for colon cancer. On a yearly basis, your health care provider may provide home test kits to check  for hidden blood in the stool. Use of a small camera at the end of a tube, to directly examine the colon (sigmoidoscopy or colonoscopy), can detect the earliest forms of colorectal cancer. Talk to your health care provider about this at age 50, when routine screening begins. Direct exam of the colon should be repeated every 5-10 years through age 75 years, unless early forms of precancerous polyps or small growths are found.  People who are at an increased risk for hepatitis B should be screened for this virus. You are considered at high risk for hepatitis B if:  You were born in a country where hepatitis B occurs often. Talk with your health care provider about which countries are considered high risk.  Your parents were born in a high-risk country and you have not received a shot to protect against hepatitis B (hepatitis B vaccine).  You have HIV or AIDS.  You use needles to inject street drugs.  You live with, or have sex with, someone who has hepatitis B.  You get hemodialysis treatment.  You take certain medicines for conditions like cancer, organ transplantation, and autoimmune conditions.  Hepatitis C blood testing is recommended for all people born from 1945 through 1965 and any individual with known risks for hepatitis C.  Practice safe sex. Use condoms and avoid high-risk sexual practices to reduce the spread of sexually transmitted infections (STIs). STIs include gonorrhea, chlamydia, syphilis, trichomonas, herpes, HPV, and human immunodeficiency virus (HIV). Herpes, HIV, and HPV are viral illnesses that have no cure. They can result in disability, cancer, and death.  You should be screened for sexually transmitted illnesses (STIs) including gonorrhea and chlamydia if:  You are sexually active and are younger than 24 years.  You are older than 24 years and your health care provider tells you that you are at risk for this type of infection.  Your sexual activity has changed  since you were last screened and you are at an increased risk for chlamydia or gonorrhea. Ask your health care provider if you are at risk.  If you are at risk of being infected with HIV, it is recommended that you take a prescription medicine daily to prevent HIV infection. This is called preexposure prophylaxis (PrEP). You are considered at risk if:  You are sexually active and do not regularly use condoms or know the HIV status of your partner(s).  You take drugs by injection.  You are sexually active with a partner who has HIV.  Talk with your health care provider about whether you are at high risk of being infected with HIV. If   you choose to begin PrEP, you should first be tested for HIV. You should then be tested every 3 months for as long as you are taking PrEP.  Osteoporosis is a disease in which the bones lose minerals and strength with aging. This can result in serious bone fractures or breaks. The risk of osteoporosis can be identified using a bone density scan. Women ages 67 years and over and women at risk for fractures or osteoporosis should discuss screening with their health care providers. Ask your health care provider whether you should take a calcium supplement or vitamin D to reduce the rate of osteoporosis.  Menopause can be associated with physical symptoms and risks. Hormone replacement therapy is available to decrease symptoms and risks. You should talk to your health care provider about whether hormone replacement therapy is right for you.  Use sunscreen. Apply sunscreen liberally and repeatedly throughout the day. You should seek shade when your shadow is shorter than you. Protect yourself by wearing long sleeves, pants, a wide-brimmed hat, and sunglasses year round, whenever you are outdoors.  Once a month, do a whole body skin exam, using a mirror to look at the skin on your back. Tell your health care provider of new moles, moles that have irregular borders, moles that  are larger than a pencil eraser, or moles that have changed in shape or color.  Stay current with required vaccines (immunizations).  Influenza vaccine. All adults should be immunized every year.  Tetanus, diphtheria, and acellular pertussis (Td, Tdap) vaccine. Pregnant women should receive 1 dose of Tdap vaccine during each pregnancy. The dose should be obtained regardless of the length of time since the last dose. Immunization is preferred during the 27th-36th week of gestation. An adult who has not previously received Tdap or who does not know her vaccine status should receive 1 dose of Tdap. This initial dose should be followed by tetanus and diphtheria toxoids (Td) booster doses every 10 years. Adults with an unknown or incomplete history of completing a 3-dose immunization series with Td-containing vaccines should begin or complete a primary immunization series including a Tdap dose. Adults should receive a Td booster every 10 years.  Varicella vaccine. An adult without evidence of immunity to varicella should receive 2 doses or a second dose if she has previously received 1 dose. Pregnant females who do not have evidence of immunity should receive the first dose after pregnancy. This first dose should be obtained before leaving the health care facility. The second dose should be obtained 4-8 weeks after the first dose.  Human papillomavirus (HPV) vaccine. Females aged 13-26 years who have not received the vaccine previously should obtain the 3-dose series. The vaccine is not recommended for use in pregnant females. However, pregnancy testing is not needed before receiving a dose. If a female is found to be pregnant after receiving a dose, no treatment is needed. In that case, the remaining doses should be delayed until after the pregnancy. Immunization is recommended for any person with an immunocompromised condition through the age of 61 years if she did not get any or all doses earlier. During the  3-dose series, the second dose should be obtained 4-8 weeks after the first dose. The third dose should be obtained 24 weeks after the first dose and 16 weeks after the second dose.  Zoster vaccine. One dose is recommended for adults aged 30 years or older unless certain conditions are present.  Measles, mumps, and rubella (MMR) vaccine. Adults born  before 1957 generally are considered immune to measles and mumps. Adults born in 1957 or later should have 1 or more doses of MMR vaccine unless there is a contraindication to the vaccine or there is laboratory evidence of immunity to each of the three diseases. A routine second dose of MMR vaccine should be obtained at least 28 days after the first dose for students attending postsecondary schools, health care workers, or international travelers. People who received inactivated measles vaccine or an unknown type of measles vaccine during 1963-1967 should receive 2 doses of MMR vaccine. People who received inactivated mumps vaccine or an unknown type of mumps vaccine before 1979 and are at high risk for mumps infection should consider immunization with 2 doses of MMR vaccine. For females of childbearing age, rubella immunity should be determined. If there is no evidence of immunity, females who are not pregnant should be vaccinated. If there is no evidence of immunity, females who are pregnant should delay immunization until after pregnancy. Unvaccinated health care workers born before 1957 who lack laboratory evidence of measles, mumps, or rubella immunity or laboratory confirmation of disease should consider measles and mumps immunization with 2 doses of MMR vaccine or rubella immunization with 1 dose of MMR vaccine.  Pneumococcal 13-valent conjugate (PCV13) vaccine. When indicated, a person who is uncertain of his immunization history and has no record of immunization should receive the PCV13 vaccine. All adults 65 years of age and older should receive this  vaccine. An adult aged 19 years or older who has certain medical conditions and has not been previously immunized should receive 1 dose of PCV13 vaccine. This PCV13 should be followed with a dose of pneumococcal polysaccharide (PPSV23) vaccine. Adults who are at high risk for pneumococcal disease should obtain the PPSV23 vaccine at least 8 weeks after the dose of PCV13 vaccine. Adults older than 72 years of age who have normal immune system function should obtain the PPSV23 vaccine dose at least 1 year after the dose of PCV13 vaccine.  Pneumococcal polysaccharide (PPSV23) vaccine. When PCV13 is also indicated, PCV13 should be obtained first. All adults aged 65 years and older should be immunized. An adult younger than age 65 years who has certain medical conditions should be immunized. Any person who resides in a nursing home or long-term care facility should be immunized. An adult smoker should be immunized. People with an immunocompromised condition and certain other conditions should receive both PCV13 and PPSV23 vaccines. People with human immunodeficiency virus (HIV) infection should be immunized as soon as possible after diagnosis. Immunization during chemotherapy or radiation therapy should be avoided. Routine use of PPSV23 vaccine is not recommended for American Indians, [place] Natives, or people younger than 65 years unless there are medical conditions that require PPSV23 vaccine. When indicated, people who have unknown immunization and have no record of immunization should receive PPSV23 vaccine. One-time revaccination 5 years after the first dose of PPSV23 is recommended for people aged 19-64 years who have chronic kidney failure, nephrotic syndrome, asplenia, or immunocompromised conditions. People who received 1-2 doses of PPSV23 before age 65 years should receive another dose of PPSV23 vaccine at age 65 years or later if at least 5 years have passed since the previous dose. Doses of PPSV23 are not  needed for people immunized with PPSV23 at or after age 65 years.  Meningococcal vaccine. Adults with asplenia or persistent complement component deficiencies should receive 2 doses of quadrivalent meningococcal conjugate (MenACWY-D) vaccine. The doses should be obtained   at least 2 months apart. Microbiologists working with certain meningococcal bacteria, [place] recruits, people at risk during an outbreak, and people who travel to or live in countries with a high rate of meningitis should be immunized. A first-year college student up through age 34 years who is living in a residence hall should receive a dose if she did not receive a dose on or after her 16th birthday. Adults who have certain high-risk conditions should receive one or more doses of vaccine.  Hepatitis A vaccine. Adults who wish to be protected from this disease, have certain high-risk conditions, work with hepatitis A-infected animals, work in hepatitis A research labs, or travel to or work in countries with a high rate of hepatitis A should be immunized. Adults who were previously unvaccinated and who anticipate close contact with an international adoptee during the first 60 days after arrival in the [place] from a country with a high rate of hepatitis A should be immunized.  Hepatitis B vaccine. Adults who wish to be protected from this disease, have certain high-risk conditions, may be exposed to blood or other infectious body fluids, are household contacts or sex partners of hepatitis B positive people, are clients or workers in certain care facilities, or travel to or work in countries with a high rate of hepatitis B should be immunized.  Haemophilus influenzae type b (Hib) vaccine. A previously unvaccinated person with asplenia or sickle cell disease or having a scheduled splenectomy should receive 1 dose of Hib vaccine. Regardless of previous immunization, a recipient of a hematopoietic stem cell transplant should receive a  3-dose series 6-12 months after her successful transplant. Hib vaccine is not recommended for adults with HIV infection. Preventive Services / Frequency Ages 35 to 4 years  Blood pressure check.** / Every 3-5 years.  Lipid and cholesterol check.** / Every 5 years beginning at age 60.  Clinical breast exam.** / Every 3 years for women in their 71s and 10s.  BRCA-related cancer risk assessment.** / For women who have family members with a BRCA-related cancer (breast, ovarian, tubal, or peritoneal cancers).  Pap test.** / Every 2 years from ages 76 through 26. Every 3 years starting at age 61 through age 76 or 93 with a history of 3 consecutive normal Pap tests.  HPV screening.** / Every 3 years from ages 37 through ages 60 to 51 with a history of 3 consecutive normal Pap tests.  Hepatitis C blood test.** / For any individual with known risks for hepatitis C.  Skin self-exam. / Monthly.  Influenza vaccine. / Every year.  Tetanus, diphtheria, and acellular pertussis (Tdap, Td) vaccine.** / Consult your health care provider. Pregnant women should receive 1 dose of Tdap vaccine during each pregnancy. 1 dose of Td every 10 years.  Varicella vaccine.** / Consult your health care provider. Pregnant females who do not have evidence of immunity should receive the first dose after pregnancy.  HPV vaccine. / 3 doses over 6 months, if 93 and younger. The vaccine is not recommended for use in pregnant females. However, pregnancy testing is not needed before receiving a dose.  Measles, mumps, rubella (MMR) vaccine.** / You need at least 1 dose of MMR if you were born in 1957 or later. You may also need a 2nd dose. For females of childbearing age, rubella immunity should be determined. If there is no evidence of immunity, females who are not pregnant should be vaccinated. If there is no evidence of immunity, females who are  pregnant should delay immunization until after pregnancy.  Pneumococcal  13-valent conjugate (PCV13) vaccine.** / Consult your health care provider.  Pneumococcal polysaccharide (PPSV23) vaccine.** / 1 to 2 doses if you smoke cigarettes or if you have certain conditions.  Meningococcal vaccine.** / 1 dose if you are age 68 to 8 years and a Market researcher living in a residence hall, or have one of several medical conditions, you need to get vaccinated against meningococcal disease. You may also need additional booster doses.  Hepatitis A vaccine.** / Consult your health care provider.  Hepatitis B vaccine.** / Consult your health care provider.  Haemophilus influenzae type b (Hib) vaccine.** / Consult your health care provider. Ages 7 to 53 years  Blood pressure check.** / Every year.  Lipid and cholesterol check.** / Every 5 years beginning at age 25 years.  Lung cancer screening. / Every year if you are aged 11-80 years and have a 30-pack-year history of smoking and currently smoke or have quit within the past 15 years. Yearly screening is stopped once you have quit smoking for at least 15 years or develop a health problem that would prevent you from having lung cancer treatment.  Clinical breast exam.** / Every year after age 48 years.  BRCA-related cancer risk assessment.** / For women who have family members with a BRCA-related cancer (breast, ovarian, tubal, or peritoneal cancers).  Mammogram.** / Every year beginning at age 41 years and continuing for as long as you are in good health. Consult with your health care provider.  Pap test.** / Every 3 years starting at age 65 years through age 37 or 70 years with a history of 3 consecutive normal Pap tests.  HPV screening.** / Every 3 years from ages 72 years through ages 60 to 40 years with a history of 3 consecutive normal Pap tests.  Fecal occult blood test (FOBT) of stool. / Every year beginning at age 21 years and continuing until age 5 years. You may not need to do this test if you get  a colonoscopy every 10 years.  Flexible sigmoidoscopy or colonoscopy.** / Every 5 years for a flexible sigmoidoscopy or every 10 years for a colonoscopy beginning at age 35 years and continuing until age 48 years.  Hepatitis C blood test.** / For all people born from [birth year] through 1965 and any individual with known risks for hepatitis C.  Skin self-exam. / Monthly.  Influenza vaccine. / Every year.  Tetanus, diphtheria, and acellular pertussis (Tdap/Td) vaccine.** / Consult your health care provider. Pregnant women should receive 1 dose of Tdap vaccine during each pregnancy. 1 dose of Td every 10 years.  Varicella vaccine.** / Consult your health care provider. Pregnant females who do not have evidence of immunity should receive the first dose after pregnancy.  Zoster vaccine.** / 1 dose for adults aged 30 years or older.  Measles, mumps, rubella (MMR) vaccine.** / You need at least 1 dose of MMR if you were born in 1957 or later. You may also need a second dose. For females of childbearing age, rubella immunity should be determined. If there is no evidence of immunity, females who are not pregnant should be vaccinated. If there is no evidence of immunity, females who are pregnant should delay immunization until after pregnancy.  Pneumococcal 13-valent conjugate (PCV13) vaccine.** / Consult your health care provider.  Pneumococcal polysaccharide (PPSV23) vaccine.** / 1 to 2 doses if you smoke cigarettes or if you have certain conditions.  Meningococcal vaccine.** /  Consult your health care provider.  Hepatitis A vaccine.** / Consult your health care provider.  Hepatitis B vaccine.** / Consult your health care provider.  Haemophilus influenzae type b (Hib) vaccine.** / Consult your health care provider. Ages 64 years and over  Blood pressure check.** / Every year.  Lipid and cholesterol check.** / Every 5 years beginning at age 23 years.  Lung cancer screening. / Every year if you  are aged 16-80 years and have a 30-pack-year history of smoking and currently smoke or have quit within the past 15 years. Yearly screening is stopped once you have quit smoking for at least 15 years or develop a health problem that would prevent you from having lung cancer treatment.  Clinical breast exam.** / Every year after age 74 years.  BRCA-related cancer risk assessment.** / For women who have family members with a BRCA-related cancer (breast, ovarian, tubal, or peritoneal cancers).  Mammogram.** / Every year beginning at age 44 years and continuing for as long as you are in good health. Consult with your health care provider.  Pap test.** / Every 3 years starting at age 58 years through age 22 or 39 years with 3 consecutive normal Pap tests. Testing can be stopped between 65 and 70 years with 3 consecutive normal Pap tests and no abnormal Pap or HPV tests in the past 10 years.  HPV screening.** / Every 3 years from ages 64 years through ages 70 or 61 years with a history of 3 consecutive normal Pap tests. Testing can be stopped between 65 and 70 years with 3 consecutive normal Pap tests and no abnormal Pap or HPV tests in the past 10 years.  Fecal occult blood test (FOBT) of stool. / Every year beginning at age 40 years and continuing until age 27 years. You may not need to do this test if you get a colonoscopy every 10 years.  Flexible sigmoidoscopy or colonoscopy.** / Every 5 years for a flexible sigmoidoscopy or every 10 years for a colonoscopy beginning at age 7 years and continuing until age 32 years.  Hepatitis C blood test.** / For all people born from [birth year] through 1967 and any individual with known risks for hepatitis C.  Osteoporosis screening.** / A one-time screening for women ages 30 years and over and women at risk for fractures or osteoporosis.  Skin self-exam. / Monthly.  Influenza vaccine. / Every year.  Tetanus, diphtheria, and acellular pertussis (Tdap/Td)  vaccine.** / 1 dose of Td every 10 years.  Varicella vaccine.** / Consult your health care provider.  Zoster vaccine.** / 1 dose for adults aged 35 years or older.  Pneumococcal 13-valent conjugate (PCV13) vaccine.** / Consult your health care provider.  Pneumococcal polysaccharide (PPSV23) vaccine.** / 1 dose for all adults aged 46 years and older.  Meningococcal vaccine.** / Consult your health care provider.  Hepatitis A vaccine.** / Consult your health care provider.  Hepatitis B vaccine.** / Consult your health care provider.  Haemophilus influenzae type b (Hib) vaccine.** / Consult your health care provider. ** Family history and personal history of risk and conditions may change your health care provider's recommendations.   This information is not intended to replace advice given to you by your health care provider. Make sure you discuss any questions you have with your health care provider.   Document Released: 06/29/2001 Document Revised: 05/24/2014 Document Reviewed: 09/28/2010 Elsevier Interactive Patient Education [employer].

## 2015-10-14 NOTE — Progress Notes (Signed)
Pre visit review using our clinic review tool, if applicable. No additional management support is needed unless otherwise documented below in the visit note. 

## 2015-10-14 NOTE — Assessment & Plan Note (Signed)

## 2015-10-14 NOTE — Telephone Encounter (Signed)
Let Victoria [name] know TB blood test for latent Tb was negative.   Thanks  Dr. Brand Males, M.D., [facility].C.P Pulmonary and Critical Care Medicine Staff Physician [place] [facility] Pager: [phone], If no answer or between  15:00h - 7:00h: call [phone]  10/14/2015 8:11 AM

## 2015-10-15 ENCOUNTER — Encounter: Payer: Self-pay | Admitting: Internal Medicine

## 2015-10-15 NOTE — Telephone Encounter (Signed)
Spoke with pt, aware of results/recs.  Nothing further needed.  

## 2015-10-16 ENCOUNTER — Telehealth: Payer: Self-pay | Admitting: Internal Medicine

## 2015-10-16 NOTE — Telephone Encounter (Signed)
Patient called to inquire on lab results. No interpretation at this point. Please advise patient.

## 2015-10-17 NOTE — Telephone Encounter (Signed)
Letter sent, will inform pt

## 2015-10-17 NOTE — Telephone Encounter (Signed)
Pt inform.

## 2015-10-21 ENCOUNTER — Encounter (INDEPENDENT_AMBULATORY_CARE_PROVIDER_SITE_OTHER): Payer: PPO | Admitting: Internal Medicine

## 2015-10-21 ENCOUNTER — Ambulatory Visit (INDEPENDENT_AMBULATORY_CARE_PROVIDER_SITE_OTHER)
Admission: RE | Admit: 2015-10-21 | Discharge: 2015-10-21 | Disposition: A | Discharge status: Home / Self Care | Payer: PPO | Source: Ambulatory Visit | Attending: Internal Medicine | Admitting: Internal Medicine

## 2015-10-21 ENCOUNTER — Other Ambulatory Visit: Payer: Self-pay | Admitting: Internal Medicine

## 2015-10-21 ENCOUNTER — Encounter (HOSPITAL_COMMUNITY): Payer: Self-pay | Admitting: *Deleted

## 2015-10-21 ENCOUNTER — Ambulatory Visit (INDEPENDENT_AMBULATORY_CARE_PROVIDER_SITE_OTHER): Payer: PPO | Admitting: Internal Medicine

## 2015-10-21 VITALS — BP 132/82 | HR 84 | Temp 97.7°F | Resp 17 | Wt 213.4 lb

## 2015-10-21 DIAGNOSIS — J449 Chronic obstructive pulmonary disease, unspecified: Secondary | ICD-10-CM | POA: Diagnosis not present

## 2015-10-21 DIAGNOSIS — Z006 Encounter for examination for normal comparison and control in clinical research program: Secondary | ICD-10-CM

## 2015-10-21 DIAGNOSIS — J439 Emphysema, unspecified: Secondary | ICD-10-CM | POA: Diagnosis not present

## 2015-10-21 LAB — PULMONARY FUNCTION TEST
FEF 25-75 POST: 0.42 L/s
FEF 25-75 PRE: 0.24 L/s
FEF2575-%CHANGE-POST: 70 %
FEF2575-%PRED-POST: 25 %
FEF2575-%Pred-Pre: 14 %
FEV1-%CHANGE-POST: 20 %
FEV1-%PRED-POST: 47 %
FEV1-%Pred-Pre: 39 %
FEV1-POST: 0.88 L
FEV1-Pre: 0.73 L
FEV1FVC-%CHANGE-POST: 9 %
FEV1FVC-%PRED-PRE: 57 %
FEV6-%CHANGE-POST: 11 %
FEV6-%PRED-POST: 77 %
FEV6-%Pred-Pre: 69 %
FEV6-Post: 1.76 L
FEV6-Pre: 1.58 L
FEV6FVC-%CHANGE-POST: 0 %
FEV6FVC-%Pred-Post: 100 %
FEV6FVC-%Pred-Pre: 99 %
FVC-%Change-Post: 10 %
FVC-%Pred-Post: 77 %
FVC-%Pred-Pre: 69 %
FVC-Post: 1.83 L
FVC-Pre: 1.66 L
POST FEV1/FVC RATIO: 48 %
Post FEV6/FVC ratio: 96 %
Pre FEV1/FVC ratio: 44 %
Pre FEV6/FVC Ratio: 95 %

## 2015-10-21 NOTE — Patient Instructions (Signed)
   Proceed per protoco

## 2015-10-21 NOTE — Progress Notes (Addendum)
Title: Randomised, Double-Blind (Sponsor Open), Placebo-Controlled, Multicentre, Dose Ranging Study to Evaluate the Efficacy and Safety of Danirixin Tablets Administered Twice Daily Compared With Placebo for 24 Weeks in Adults With COPD  Sponsor: [employer]  Protoocol Number: [id number] NCT Trial #: [id number]  Synopsis:  Following baseline assessments collected over a 7 day period participants will be randomized (1:1:1:1:1:1) to receive one of five dose strengths of danirixin (5 milligram [mg], 10 mg, 25 mg, 35 mg and 50 mg) or placebo. Study treatment will be administered orally twice daily for 24 weeks. Participants will continue with their standard of care inhaled medications during study. Follow up will continue up to 28 days post last dose  Key Inclusion Criteria: age 67-80, COPD with fev1 >/= 40%, ratio <0.7, >/= 2 copd exacerbations in past 1 year treated with either antibiotics or steroids, but >/= 14 days since last antibiotic or steroid for exacerbation, >/= 10 pack smoking history  Key Exclusion Criteria: non-COPD lung disease, pulse ox < 88% at rest on room air (i.e, restting o2 patients excluded), active phase of pulmonary rehabilitation (maintenance ok), previous lung surgery, QTc prolongation, HIV positive etc..   Key end points: Changes in respiratory symptoms and copd exacerbations and safety of DNX  Key features of DNX:   selective CXC chemokine receptor (CXCR2) antagonist . Positive trends in COPD exacerbation, symptoms and health status  Key known risk factors for DNX: As of April 2017 (5 completed Phase 1 studies, and 1 Phase 2A study of moderate copd patients with 45 patients):  known to produce mild to moderate headache and diarrhea. No known influence on vital signs, ECG, spirometry, labs when compared to placebo. No evidence of increased infections or neutropenia  ............................... CRC note Subject comes in for screening visit 1 for the above mentioned  study.  She completed the pre-screen on 25May2017.  All assessments were completed in the required order per protocol.  She will see Dr. Chase Caller for a physical exam also this am.  After upload of CXR and lab results, will contact subject to return for visit 2.   PI note  S Screening visit for the above study. This visit is for schedule of events involves concomitant medications, history and physical and blood work and labs. At this point in time she's feeling in stable COPD health. We discussed the concept of therapeutic misconception. 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 She denied any questions from the informed consent form she signed earlier. She understands this is screening visit.    PAST HX   COPD" : In the chart there is a remote diagnosis of asthma but in February  20th 2013: I did full pulmonary function test that showed FEV1 1 L/44% postbronchodilator. There was no bronchodilator response. Ratio is 45 showing severe obstruction. DLCO was 12.3/51% and this was reduced. This pattern seen with emphysema/COPD. In fact on 10/09/2015 CT scan of the chest SOC emphysematous noted therefore the current diagnosis of COPD  "Tuberculosis": This is listed in the chart standard of care computer chart in 2009. In talking to the patient and detailed review of the electronic medical record shows that it was a positive skin test at the [facility]. She was offered latent TB INH prophylaxis but she declined. We did Qunatieferon gold TB test [facility] 10/09/2015 and this was negative  Spirometry today 10/21/2015 done for research purposes shows post bronchodilator FEV1 of 0.88 L/47% but she has a 20% bronchodilator response. Ratio is 48. Prebronchodilator FEV1  is 0.73 L/39% with a ratio 44.    has a past medical history of Arthritis; Varicella (as child); Heart murmur; Hypertension; Hyperlipidemia; Tuberculosis (2009); COPD (chronic obstructive pulmonary disease) ([place]); and  Murmur, cardiac (1950).    reports that she quit smoking about 4 years ago. Her smoking use included Cigarettes. She has a 50 pack-year smoking history. She quit smokeless tobacco use about 4 years ago.  Past Surgical History  Procedure Laterality Date   Excision of pelvic absess, right ovary      2008   Appendectomy      2008   Cesarean section      1972    Allergies  Allergen Reactions   Nickel Rash    Severe rash to infection: pt is allergic to all metals other than sterling silver or gold jewelry.    Benicar [Olmesartan] Swelling    Swelling of face and arms    [name] [Valsartan] Swelling    Swelling of face and arms    Lisinopril Cough   Codeine Other (See Comments)    jittery   Monosodium Glutamate Other (See Comments)    Facial swelling per pt    Immunization History  Administered Date(s) Administered   Influenza Split 01/16/2011   Influenza,inj,Quad PF,36+ Mos 02/01/2013, 02/27/2014, 04/07/2015   Pneumococcal Conjugate-13 02/01/2013   Pneumococcal Polysaccharide-23 10/29/2014   Td 12/06/2011    Family History  Problem Relation Age of Onset   Heart disease Mother     MI - fatal   Hypertension Mother    Stroke Father 67    fatal   Alzheimer's disease Father    Alzheimer's disease Brother    Hyperlipidemia Brother    Hypertension Brother    Diabetes Brother    Hypertension Brother    Hyperlipidemia Brother      Current outpatient prescriptions:    albuterol (VENTOLIN HFA) 108 (90 Base) MCG/ACT inhaler, Inhale 1 puff into the lungs every 6 (six) hours as needed for wheezing., Disp: 18 each, Rfl: 5   amLODipine (NORVASC) 10 MG tablet, Take 1 tablet (10 mg total) by mouth daily., Disp: 90 tablet, Rfl: 1   Aspirin-Calcium Carbonate (BAYER WOMENS) 81-300 MG TABS, Take 1 tablet by mouth daily with breakfast., Disp: , Rfl:    beclomethasone (QVAR) 80 MCG/ACT inhaler, Inhale 2 puffs into the lungs 2 (two) times daily., Disp: 4  Inhaler, Rfl: 0   carvedilol (COREG) 3.125 MG tablet, Take 1 tablet (3.125 mg total) by mouth 2 (two) times daily with a meal., Disp: 90 tablet, Rfl: 1   Cholecalciferol (VITAMIN D-3) 5000 UNITS TABS, Take 1 tablet by mouth once a week. , Disp: , Rfl:    hydrochlorothiazide (HYDRODIURIL) 25 MG tablet, Take 1 tablet (25 mg total) by mouth daily., Disp: 90 tablet, Rfl: 1   ipratropium-albuterol (DUONEB) 0.5-2.5 (3) MG/3ML SOLN, USE ONE VIAL VIA NEBULIZER 4 TIMES DAILY, Disp: 75 mL, Rfl: 0   loratadine-pseudoephedrine (CLARITIN-D 24-HOUR) 10-240 MG 24 hr tablet, Take 1 tablet by mouth daily. Reported on 10/14/2015, Disp: , Rfl:    lovastatin (MEVACOR) 20 MG tablet, Take 1 tablet (20 mg total) by mouth daily., Disp: 90 tablet, Rfl: 3   OXYGEN, Inhale 2 L into the lungs at bedtime. Reported on 06/24/2015, Disp: , Rfl:   \  OBJECTIVE Filed Vitals:   10/21/15 1004  BP: 132/82  Pulse: 84  Temp: 97.7 F (36.5 C)  TempSrc: Oral  Resp: 17  Weight: 213 lb 6.4 oz (96.798 kg)  SpO2: 90%    Estimated body mass index is 36.08 kg/(m^2) as calculated from the following:   Height as of 10/14/15: 5' 4.5" (1.638 m).   Weight as of this encounter: 213 lb 6.4 oz (96.798 kg).   Nonfocal exam. Details in source document  LAB WORK in [facility] chart review brelow  - One from before 10/09/2015 was negative [in 2009 she gave a history of positive tuberculosis skin test at the [facility] [place] but at that point she had deferred INH for latent TB )  - Hepatitis C virus antibody 10/14/2015 is negative  - Creatinine 0.76 mg percent, normal liver function test, hemoglobin 13.6 g percent and normal TSH\    EKG done as part of research 10/21/2015  The EKGs reviewed. Sinus rhythm with poor R-wave progression similar to November 2016 and without change not clinically significant.-   CT Chjest 10/09/15 - SOC CT chst EXAM: CT CHEST WITHOUT CONTRAST LOW-DOSE FOR LUNG CANCER  SCREENING  TECHNIQUE: Multidetector CT imaging of the chest was performed following the standard protocol without IV contrast.  COMPARISON: 03/05/2014 routine unenhanced chest CT.  FINDINGS: Mediastinum/Nodes: Normal heart size. No pericardial fluid/thickening. Left main, left anterior descending and right coronary atherosclerosis. Atherosclerotic nonaneurysmal thoracic aorta. Top-normal caliber pulmonary arteries. Normal visualized thyroid. Normal esophagus. No pathologically enlarged axillary, mediastinal or gross hilar lymph nodes, noting limited sensitivity for the detection of hilar adenopathy on this noncontrast study.  Lungs/Pleura: No pneumothorax. No pleural effusion. Mild-to-moderate centrilobular emphysema with diffuse bronchial wall thickening. Parenchymal bands in the lingula, medial right middle lobe and bilateral lower lobes. No acute consolidative airspace disease. Scattered pulmonary nodules measuring up to 3.6 mm in volume derived mean diameter in the anterior right lower lobe (series 3/image 189).  Upper abdomen: Stable left adrenal 3.0 cm adenoma with density 1 HU.  Musculoskeletal: No aggressive appearing focal osseous lesions. Mild degenerative changes in the thoracic spine.  IMPRESSION: 1. Lung-RADS Category 2S, benign appearance or behavior. Continue annual screening with low-dose chest CT without contrast in 12 months 2. The "S" modifier above refers to potentially clinically significant non lung cancer related findings. Specifically, left main and two-vessel coronary atherosclerosis, mild to moderate emphysema and left adrenal adenoma.   Electronically Signed  By: Ilona Sorrel M.D.  On: 10/09/2015 15:08    CXR - research test  Dg Chest 2 View  10/21/2015  CLINICAL DATA:  70 year old female with COPD and emphysema EXAM: CHEST  2 VIEW COMPARISON:  Prior chest CT 10/09/2015; prior chest x-ray 05/27/2014 FINDINGS: Stable cardiac and  mediastinal contours. Atherosclerotic calcifications again noted in the transverse aorta. The descending thoracic aorta is tortuous. Similar pattern of emphysematous changes, diffuse mild bronchial wall thickening and bilateral lower lobe pleural parenchymal scarring compared to prior imaging. No suspicious pulmonary mass or nodule. No evidence of pulmonary edema, focal airspace consolidation, pneumothorax or pleural effusion. The lungs remain slightly hyperinflated. No acute osseous abnormality. Multilevel degenerative endplate spurring again noted. IMPRESSION: Stable chest x-ray without evidence of acute cardiopulmonary process. Electronically Signed   By: [name] M.D.   On: 10/21/2015 10:34      ASSESSMENT/PLAN 1.Primary problem of research patient 2. COPD Gold stage III stable disease  PLAN  - Complete screening visit for protocol  - IN addition, as PI I personally reviewed the following concepts   1. Scientific Purpose  Clinical research is designed to produce generalizable knowledge and to answer questions about the safety and efficacy of intervention(s)  under study in order to determine whether or not they may be useful for the care of future patients.  2. Study Procedures  Participation in a trial may involve procedures or tests, in addition to the intervention(s) under study, that are intended only or primarily to generate scientific knowledge and that are otherwise not necessary for patient care.   3. Uncertainty  For intervention(s) under study in clinical research, there often is less knowledge and more uncertainty about the risks and benefits to a population of trial participants than there is when a doctor offers a patient standard interventions.   4. Adherence to Protocol  Administration of the intervention(s) under study is typically based on a strict protocol with defined dose, scheduling, and use or avoidance of concurrent medications, compared to  administration of standard interventions.  5. Clinician as Investigator  Clinicians who are in health care settings provide treatment; in a clinical trial setting, they are also investigating safety and efficacy of an intervention. In otherwise your doctor or nurse practitioner can be wearing 2 hats - one as care giver another as Company secretary  6. Patient as Visual merchandiser Subject  Patients participating in research trials are research subjects or volunteers. In other words participating in research is 100% voluntary and at one's own free weill. The decision to participate or not participate will NOT affect patient care and the doctor-patient relationship in any way  7. Financial Conflict of Interest Disclosure  Your referring physician(s) for the research trial has an investment interest in [employer], [facility] the clinical trials site and is both the company and the investigators are being compensated for their effort in trial research activities. At no point is anyone being directly compensated merely for referring you to research participation      Dr. Brand Males, M.D., [facility].C.P Pulmonary and Critical Care Medicine Staff Physician [place] [facility] Pager: [phone], If no answer or between  15:00h - 7:00h: call [phone]  10/21/2015 11:48 AM

## 2015-10-21 NOTE — Progress Notes (Signed)
Research spirometry pre and post done today.

## 2015-10-23 ENCOUNTER — Telehealth: Payer: Self-pay | Admitting: Internal Medicine

## 2015-10-23 DIAGNOSIS — J449 Chronic obstructive pulmonary disease, unspecified: Secondary | ICD-10-CM

## 2015-10-23 MED ORDER — IPRATROPIUM-ALBUTEROL 0.5-2.5 (3) MG/3ML IN SOLN: RESPIRATORY_TRACT | Status: DC

## 2015-10-23 NOTE — Telephone Encounter (Signed)
Patient called and is now requesting samples of Duoneb solution for nebulizer. She is also stating that she would like order for nebulizer supplies and medicine to go to [facility] since they are in network. -prm

## 2015-10-23 NOTE — Telephone Encounter (Signed)
Spoke with pt and she states that [facility] is the only DME that takes her insurance. She would like rx for neb machine and meds sent to them. She is almost out of Duoneb so is requesting rx be sent to [facility] for 30D supply until she gets it from [facility] since they are mail order. Order placed for neb machine, tubing and supplies with [facility]. Rx sent to [facility] for Duoneb. Nothing further needed.

## 2015-10-28 ENCOUNTER — Telehealth (HOSPITAL_COMMUNITY): Payer: Self-pay

## 2015-10-28 NOTE — Telephone Encounter (Signed)
Encounter complete. 

## 2015-10-29 DIAGNOSIS — J449 Chronic obstructive pulmonary disease, unspecified: Secondary | ICD-10-CM

## 2015-10-29 DIAGNOSIS — Z006 Encounter for examination for normal comparison and control in clinical research program: Secondary | ICD-10-CM

## 2015-10-30 ENCOUNTER — Ambulatory Visit (HOSPITAL_COMMUNITY)
Admission: RE | Admit: 2015-10-30 | Discharge: 2015-10-30 | Disposition: A | Discharge status: Home / Self Care | Payer: PPO | Source: Ambulatory Visit | Attending: Internal Medicine | Admitting: Internal Medicine

## 2015-10-30 ENCOUNTER — Encounter (HOSPITAL_COMMUNITY): Payer: Self-pay | Admitting: *Deleted

## 2015-10-30 ENCOUNTER — Telehealth: Payer: Self-pay | Admitting: Internal Medicine

## 2015-10-30 DIAGNOSIS — R0609 Other forms of dyspnea: Principal | ICD-10-CM

## 2015-10-30 DIAGNOSIS — R0600 Dyspnea, unspecified: Secondary | ICD-10-CM

## 2015-10-30 DIAGNOSIS — I2510 Atherosclerotic heart disease of native coronary artery without angina pectoris: Secondary | ICD-10-CM

## 2015-10-30 NOTE — Telephone Encounter (Signed)
I have ordered another test

## 2015-10-30 NOTE — Telephone Encounter (Signed)
Please advise 

## 2015-10-30 NOTE — Progress Notes (Signed)
Title: Randomised, Double-Blind (Sponsor Open), Placebo-Controlled, Multicentre, Dose Ranging Study to Evaluate the Efficacy and Safety of Danirixin Tablets Administered Twice Daily Compared With Placebo for 24 Weeks in Adults With COPD  Sponsor: [employer]  Protoocol Number: [id number] NCT Trial #: [id number]  Synopsis:  Following baseline assessments collected over a 7 day period participants will be randomized (1:1:1:1:1:1) to receive one of five dose strengths of danirixin (5 milligram [mg], 10 mg, 25 mg, 35 mg and 50 mg) or placebo. Study treatment will be administered orally twice daily for 24 weeks. Participants will continue with their standard of care inhaled medications during study. Follow up will continue up to 28 days post last dose  Key Inclusion Criteria: age 75-80, COPD with fev1 >/= 40%, ratio <0.7, >/= 2 copd exacerbations in past 1 year treated with either antibiotics or steroids, but >/= 14 days since last antibiotic or steroid for exacerbation, >/= 10 pack smoking history  Key Exclusion Criteria: non-COPD lung disease, pulse ox < 88% at rest on room air (i.e, restting o2 patients excluded), active phase of pulmonary rehabilitation (maintenance ok), previous lung surgery, QTc prolongation, HIV positive etc..   Key end points: Changes in respiratory symptoms and copd exacerbations and safety of DNX  Key features of DNX:   selective CXC chemokine receptor (CXCR2) antagonist . Positive trends in COPD exacerbation, symptoms and health status  Key known risk factors for DNX: As of April 2017 (5 completed Phase 1 studies, and 1 Phase 2A study of moderate copd patients with 45 patients):  known to produce mild to moderate headache and diarrhea. No known influence on vital signs, ECG, spirometry, labs when compared to placebo. No evidence of increased infections or neutropenia  Subject returns to complete Visit 2 on protocol.  Per protocol this study Coordinator has verified the  subject still meets all inclusion and exclusion criteria.  She does not complain of any new problems or changes to her medications.  Subject has informed site that she is scheduled for a stress test (this is a routine) on [phone] but is unsure as to if she will complete it.  Subject was asked if she had any questions regarding her role in the study and and she was satisfied with previous explanations at the screening visit.  E-diary was dispensed and assigned to the subject.  All training was completed within the device and any baseline questionaires. (please see shadow chart for Serial # of device.  Then the MDI sensor was registered and again the subject was trained on the purpose and importance of keeping the sensor attached to her rescue inhaler.  The subject did consent on [id number] to participate in the substudy involving using an activity monitor.  However, due to fire walls and health system restrictions this was unable to be placed on the subject and therefore the training was not complete.  [facility] [employer] has all relevant information and screen shots of the needed site.  This was submitted to "Heather" with IT and she has forwarded it onto the appropriate division to allow access.   The subject expressed understanding for the purpose of, importance of and needs for both the diary and MDI sensor.  Subject was encouraged to call should any concerns or problems arise.  The importance of completing the diary each night was stressed to the subject as it was a direct indicator for randomization.  Per the protocol and resent change, the subject signed consent on [id number] and must complete all visits up to  Visit 3 within 30 days.  The subject should be randomized no later than [id number].  This will ensure, 7 days worth of data for compliance.  Subject was given return instructions for Visit 3. Please see source documents for a complete list of instructions per protocol.

## 2015-10-30 NOTE — Telephone Encounter (Signed)
Pt called in said that she can not do the treadmill stress trest all the way though without stopping so they are not able to get a good result.  Is there any other options for her?

## 2015-10-30 NOTE — Progress Notes (Unsigned)
Patient ID: Victoria [name], female   DOB: 1946/02/05, 70 y.o.   MRN: [id number] [name] was markedly SOB within 90 seconds walking from [place] and had to sit.  I explained the test, heart rate requirements for diagnosis, and reviewed the [facility] Protocol sheet with her.  After extensive discussion [name] determined NOT to do the test stating she couldn't do it unless she could walk, stop and sit, then walk.  She stated she had used her nebulizer at 8:30 but that she still had to sit. The test was cancelled.  Her last study was a MPI Lexiscan Cardiolite, no exercise, done on 03/01/2014, which was normal with an EF of 70%.

## 2015-11-04 ENCOUNTER — Telehealth: Payer: Self-pay | Admitting: Internal Medicine

## 2015-11-04 DIAGNOSIS — J449 Chronic obstructive pulmonary disease, unspecified: Secondary | ICD-10-CM

## 2015-11-04 NOTE — Telephone Encounter (Signed)
Last ov on 09/04/15 with MR Patient Instructions       ICD-9-CM ICD-10-CM   1. COPD, severe ([place]) 496 J44.9   2. Cancer screening V76.9 Z12.9   3. Morbid obesity, unspecified obesity type ([place]) 278.01 E66.01   #a severe COPD - This is stable but you do have significant shortness of breath that could be because of COPD and weight - We will make sure the new DME company has prescription for a nebulizer machine - Continue nebulizers - We discussed option off participating in a COPD research protocol and I will make a referral - Do full pulmonary function test in 4 months -   #Cancer screening - We will refer you to a lung cancer screening navigator Sarah  #Obesity - Congratulations on the initial start of weight loss keep it up  #Follow-up -Do full pulmonary function test in 4 months (last one was in 2013] - Return to see me after 4 months - Combination of repeat breathing test and CT scan could potentially reveal other causes of shortness of breath   Called and spoke with pt. She is requesting a portable neb machine order be sent to [facility] due to her insurance changing to [employer]. She states that her current insurance will not work with [employer] so that is why the order must be sent to [facility]. I explained to her that I would send the order today. She voiced understanding and had no further questions. Order has been placed. Nothing further needed.

## 2015-11-05 ENCOUNTER — Encounter: Payer: Self-pay | Admitting: Internal Medicine

## 2015-11-05 ENCOUNTER — Ambulatory Visit (INDEPENDENT_AMBULATORY_CARE_PROVIDER_SITE_OTHER): Payer: PPO | Admitting: Internal Medicine

## 2015-11-05 ENCOUNTER — Encounter (INDEPENDENT_AMBULATORY_CARE_PROVIDER_SITE_OTHER): Payer: PPO | Admitting: Internal Medicine

## 2015-11-05 VITALS — BP 128/74 | HR 78 | Temp 98.1°F | Resp 12 | Ht 64.5 in | Wt 214.4 lb

## 2015-11-05 DIAGNOSIS — J449 Chronic obstructive pulmonary disease, unspecified: Secondary | ICD-10-CM

## 2015-11-05 DIAGNOSIS — Z006 Encounter for examination for normal comparison and control in clinical research program: Secondary | ICD-10-CM

## 2015-11-05 LAB — PULMONARY FUNCTION TEST
FEF 25-75 POST: 0.26 L/s
FEF 25-75 PRE: 0.26 L/s
FEF2575-%CHANGE-POST: 0 %
FEF2575-%PRED-POST: 15 %
FEF2575-%PRED-PRE: 15 %
FEV1-%Change-Post: 10 %
FEV1-%PRED-POST: 37 %
FEV1-%Pred-Pre: 33 %
FEV1-PRE: 0.62 L
FEV1-Post: 0.69 L
FEV1FVC-%CHANGE-POST: 11 %
FEV1FVC-%PRED-PRE: 53 %
FEV6-%CHANGE-POST: -1 %
FEV6-%PRED-PRE: 63 %
FEV6-%Pred-Post: 62 %
FEV6-Post: 1.43 L
FEV6-Pre: 1.44 L
FEV6FVC-%Change-Post: 0 %
FEV6FVC-%Pred-Post: 99 %
FEV6FVC-%Pred-Pre: 99 %
FVC-%Change-Post: 0 %
FVC-%PRED-PRE: 63 %
FVC-%Pred-Post: 63 %
FVC-POST: 1.5 L
FVC-PRE: 1.5 L
POST FEV1/FVC RATIO: 46 %
POST FEV6/FVC RATIO: 95 %
PRE FEV1/FVC RATIO: 41 %
Pre FEV6/FVC Ratio: 96 %

## 2015-11-05 NOTE — Progress Notes (Signed)
Title: Randomised, Double-Blind (Sponsor Open), Placebo-Controlled, Multicentre, Dose Ranging Study to Evaluate the Efficacy and Safety of Danirixin Tablets Administered Twice Daily Compared With Placebo for 24 Weeks in Adults With COPD ° °Sponsor: [employer]  °Protoocol Number: [id number] °NCT Trial #: [id number] ° °Synopsis:  Following baseline assessments collected over a 7 day period participants will be randomized (1:1:1:1:1:1) to receive one of five dose strengths of danirixin (5 milligram [mg], 10 mg, 25 mg, 35 mg and 50 mg) or placebo. Study treatment will be administered orally twice daily for 24 weeks. Participants will continue with their standard of care inhaled medications during study. Follow up will continue up to 28 days post last dose ° °Key Inclusion Criteria: age 40-80, COPD with fev1 >/= 40%, ratio <0.7, >/= 2 copd exacerbations in past 1 year treated with either antibiotics or steroids, but >/= 14 days since last antibiotic or steroid for exacerbation, >/= 10 pack smoking history ° °Key Exclusion Criteria: non-COPD lung disease, pulse ox < 88% at rest on room air (i.e, restting o2 patients excluded), active phase of pulmonary rehabilitation (maintenance ok), previous lung surgery, QTc prolongation, HIV positive etc..  ° °Key end points: Changes in respiratory symptoms and copd exacerbations and safety of DNX ° °Key features of DNX:   selective CXC chemokine receptor (CXCR2) antagonist . Positive trends in COPD exacerbation, symptoms and health status ° °Key known risk factors for DNX: As of April 2017 (5 completed Phase 1 studies, and 1 Phase 2A study of moderate copd patients with 45 patients):  known to produce mild to moderate headache and diarrhea. No known influence on vital signs, ECG, spirometry, labs when compared to placebo. No evidence of increased infections or neutropenia ° °................................... °CRC NOTE °Subject returns for her randomization visit per protocol.   She has met run in criteria with an 80% completion rate.  She will have labs, ECG and PFT completed today as well per protocol.  All diary questionaires have been completed as well.  Subject will see PI, Ramaswamy, MD for a brief physical exam.  Subject is comfortable with randomized and understands to continue her usual medications.  Following the examination, patient had to be elsewher.   Because of this, the subject is returning today at approx. 415 to have EKG and randomization  per protocol guidelines.  The subject will then have study drug dispensed and observed for the allotted time per protocol.  The PI will sign off on source documents after a final review. ° ° ° °PI note °S; randomization visit. No AE. No neew issus. Seen in morning and then had to go for scheduled conflict. So returning in PM for EKG and randmization. Compliant with baseline meds ° °O: °Non focal exam in source doc ° °Labs °ekg done in 3 times in PM - baseline without change ° °Spiro - gold stage 3 obstruction with only 10% BD response ° °Blood work - last visit - normal. This visit being sent ° °A °Research exam °COPD -severe ° °  ICD-9-CM ICD-10-CM   °1. Research exam V70.7 Z00.6 EKG 12-Lead  °   Pulmonary function test  °2. Chronic obstructive pulmonary disease, unspecified COPD type ([facility]) 496 J44.9 EKG 12-Lead  °   Pulmonary function test  ° ° ° °P °Randomized - study drug given approx 17.20h under my direct supervision ° ° °FU per protocol ° °Dr. Murali Ramaswamy, M.D., F.C.C.P °Pulmonary and Critical Care Medicine °Staff Physician °[facility] °[facility] °Pager: [phone], If no answer or between  15:00h - 7:00h: call [phone]  11/05/2015 5:30 PM

## 2015-11-05 NOTE — Progress Notes (Signed)
Research spirometry pre and post.

## 2015-11-05 NOTE — Patient Instructions (Signed)
ICD-9-CM ICD-10-CM   1. Research exam V70.7 Z00.6 EKG 12-Lead     Pulmonary function test  2. Chronic obstructive pulmonary disease, unspecified COPD type ([place]) 496 J44.9 EKG 12-Lead     Pulmonary function test

## 2015-11-12 ENCOUNTER — Telehealth: Payer: Self-pay | Admitting: Internal Medicine

## 2015-11-12 NOTE — Telephone Encounter (Signed)
pls cancel her 01/06/16 PFT. She already has one recently

## 2015-11-13 DIAGNOSIS — G4733 Obstructive sleep apnea (adult) (pediatric): Secondary | ICD-10-CM | POA: Diagnosis not present

## 2015-11-13 NOTE — Telephone Encounter (Signed)
Done

## 2015-11-14 ENCOUNTER — Telehealth: Payer: Self-pay | Admitting: Internal Medicine

## 2015-11-14 NOTE — Telephone Encounter (Signed)
Called Victoria [name] to inquire about her status as of  2:24 PM 11/14/2015 afte withdrawal from Danirixin copd study. LMTCB . Triage if she calls, please forward to me but you can ask her if she is doing well and stable. I can get back to her after weekend  Dr. Brand Males, M.D., [facility].C.P Pulmonary and Critical Care Medicine Staff Physician [facility] Pager: [phone], If no answer or between  15:00h - 7:00h: call [phone]  11/14/2015 2:25 PM

## 2015-11-14 NOTE — Telephone Encounter (Signed)
Patient Victoria [name] 1945/08/20 called back - 2:31 PM 11/14/2015 - I explained reason for withdrawal from study. She has baseline dyspnea without change. No new problems since day of study drug administration. No Adverse Events. She has fu for standard of care visit for copd 01/06/16 10am. She will call back if any problems  Dr. Brand Males, M.D., [facility].C.P Pulmonary and Critical Care Medicine Staff Physician [place] [facility] Pager: [phone], If no answer or between  15:00h - 7:00h: call [phone]  11/14/2015 2:33 PM

## 2015-11-17 ENCOUNTER — Telehealth: Payer: Self-pay | Admitting: Internal Medicine

## 2015-11-17 NOTE — Telephone Encounter (Signed)
Spoke with pt, per last telephone encounter MR has already discussed his concerns with pt.  Verified next ov date/time.  Nothing further needed.

## 2015-12-02 ENCOUNTER — Other Ambulatory Visit: Payer: Self-pay | Admitting: Internal Medicine

## 2015-12-04 DIAGNOSIS — J449 Chronic obstructive pulmonary disease, unspecified: Secondary | ICD-10-CM | POA: Diagnosis not present

## 2015-12-08 ENCOUNTER — Encounter (HOSPITAL_COMMUNITY): Payer: PPO

## 2015-12-09 ENCOUNTER — Telehealth (HOSPITAL_COMMUNITY): Payer: Self-pay | Admitting: *Deleted

## 2015-12-09 NOTE — Telephone Encounter (Signed)
Patient given detailed instructions per Myocardial Perfusion Study Information Sheet for the test on 12/11/15 at 0730. Patient notified to arrive 15 minutes early and that it is imperative to arrive on time for appointment to keep from having the test rescheduled.  If you need to cancel or reschedule your appointment, please call the office within 24 hours of your appointment. Failure to do so may result in a cancellation of your appointment, and a $50 no show fee. Patient verbalized understanding.Sarvesh Meddaugh, [place]

## 2015-12-11 ENCOUNTER — Ambulatory Visit (HOSPITAL_COMMUNITY): Payer: PPO | Attending: Cardiology

## 2015-12-11 VITALS — Ht 64.0 in | Wt 214.0 lb

## 2015-12-11 DIAGNOSIS — R0609 Other forms of dyspnea: Secondary | ICD-10-CM

## 2015-12-11 DIAGNOSIS — R110 Nausea: Secondary | ICD-10-CM

## 2015-12-11 DIAGNOSIS — G4440 Drug-induced headache, not elsewhere classified, not intractable: Secondary | ICD-10-CM

## 2015-12-11 DIAGNOSIS — I10 Essential (primary) hypertension: Secondary | ICD-10-CM | POA: Diagnosis not present

## 2015-12-11 DIAGNOSIS — R0600 Dyspnea, unspecified: Secondary | ICD-10-CM

## 2015-12-11 DIAGNOSIS — I2510 Atherosclerotic heart disease of native coronary artery without angina pectoris: Secondary | ICD-10-CM | POA: Insufficient documentation

## 2015-12-11 LAB — MYOCARDIAL PERFUSION IMAGING
CHL CUP NUCLEAR SRS: 7
CHL CUP NUCLEAR SSS: 7
LV sys vol: 21 mL
LVDIAVOL: 86 mL (ref 46–106)
Peak HR: 90 {beats}/min
RATE: 0.25
Rest HR: 67 {beats}/min
SDS: 0
TID: 0.91

## 2015-12-11 MED ORDER — REGADENOSON 0.4 MG/5ML IV SOLN
0.4000 mg | Freq: Once | INTRAVENOUS | Status: AC
  Administered 2015-12-11: 0.4 mg via INTRAVENOUS

## 2015-12-11 MED ORDER — TECHNETIUM TC 99M TETROFOSMIN IV KIT
32.8000 | PACK | Freq: Once | INTRAVENOUS | Status: AC | PRN
  Administered 2015-12-11: 32.8 via INTRAVENOUS
  Filled 2015-12-11: qty 33

## 2015-12-11 MED ORDER — TECHNETIUM TC 99M TETROFOSMIN IV KIT
10.6000 | PACK | Freq: Once | INTRAVENOUS | Status: AC | PRN
  Administered 2015-12-11: 11 via INTRAVENOUS
  Filled 2015-12-11: qty 11

## 2015-12-11 MED ORDER — AMINOPHYLLINE 25 MG/ML IV SOLN
75.0000 mg | Freq: Once | INTRAVENOUS | Status: AC
  Administered 2015-12-11: 75 mg via INTRAVENOUS

## 2015-12-13 DIAGNOSIS — G4733 Obstructive sleep apnea (adult) (pediatric): Secondary | ICD-10-CM | POA: Diagnosis not present

## 2016-01-06 ENCOUNTER — Encounter (INDEPENDENT_AMBULATORY_CARE_PROVIDER_SITE_OTHER): Payer: Self-pay

## 2016-01-06 ENCOUNTER — Encounter: Payer: Self-pay | Admitting: Internal Medicine

## 2016-01-06 ENCOUNTER — Ambulatory Visit (INDEPENDENT_AMBULATORY_CARE_PROVIDER_SITE_OTHER): Payer: PPO | Admitting: Internal Medicine

## 2016-01-06 VITALS — BP 132/64 | HR 79 | Ht 64.5 in | Wt 213.0 lb

## 2016-01-06 DIAGNOSIS — G4733 Obstructive sleep apnea (adult) (pediatric): Secondary | ICD-10-CM | POA: Diagnosis not present

## 2016-01-06 DIAGNOSIS — R0689 Other abnormalities of breathing: Secondary | ICD-10-CM | POA: Diagnosis not present

## 2016-01-06 DIAGNOSIS — R0600 Dyspnea, unspecified: Secondary | ICD-10-CM | POA: Diagnosis not present

## 2016-01-06 DIAGNOSIS — J449 Chronic obstructive pulmonary disease, unspecified: Secondary | ICD-10-CM | POA: Diagnosis not present

## 2016-01-06 MED ORDER — PREDNISONE 10 MG PO TABS: ORAL_TABLET | ORAL | 0 refills | Status: DC

## 2016-01-06 NOTE — Patient Instructions (Signed)
ICD-9-CM ICD-10-CM   1. COPD, severe ([place]) 496 J44.9   2. Dyspnea and respiratory abnormality 786.09 R06.00     R06.89     - Unclear why this worsening shortness of breath - Could be related to severe COPD, possible COPD flare up because of the summer, physical deconditioning, weight and possible associated diastolic dysfunction - There is no evidence severe coronary artery disease causing shortness of breath - There is no evidence that there is anemia interstitial lung disease causing shortness of breath  Plan - Walk test on room air 01/06/2016 - Order overnight oxygen study on room air - Continue his COPD inhalers and CPAP - Continue weight loss efforts - Try 5 day prednisone for possible COPD exacerbation ; Please take prednisone 40 mg x1 day, then 30 mg x1 day, then 20 mg x1 day, then 10 mg x1 day, and then 5 mg x1 day and stop - Refer pulmonary rehabilitation at [facility]  Follow-up - Spirometry pre-and postbronchodilator with DLCO in 3 months - Return to see Dr. Chase Caller in 3 months

## 2016-01-06 NOTE — Addendum Note (Signed)
Addended by: Len Blalock on: 01/06/2016 10:49 AM   Modules accepted: Orders

## 2016-01-06 NOTE — Progress Notes (Signed)
Subjective:     Patient ID: Victoria [name], female   DOB: [birth year], 70 y.o.   MRN: [id number]   PCP Scarlette Calico, MD   HPI  OV 01/06/2016  Chief Complaint  Patient presents with  . Follow-up    c/o worsening sob worsened by hot/humid weather.   Follow-up Gold stage III COPD he uses nighttime CPAP. Not on ambulatory or resting oxygen  The past one month she is reporting worsening dyspnea on exertion the house is not fully a condition. She thinks this might be due to the heat. It is progressive. Albuterol nebulizers and not fully helping her. She says that when she walks from the bedroom to the kitchen she gets short of breath. It is relieved by rest. There is no orthopnea paroxysmal nocturnal dyspnea or chest pain or worsening cough or sputum. But there is some increase in wheeze from baseline.  Relevant tests from recent times has been reviewed   Blood work May 2017: Hemoglobin 13.6 g percent and stable with a normal creatinine 0.76 mg percent Pulmonary function test June 2017: stage III COPD FEV1 0.88 L/47% in 10/21/2015 and 0.69 L/37% later in June 2017 CT chest lung cancer screening 10/09/2015: Emphysema with very small lung nodules Echo January 2016: Shows grade 1 diastolic dysfunction Nuclear medicine cardiac stress to 12/11/2015: Normal tests Lexiscan Myoview with ejection fraction 75%      has a past medical history of Arthritis; COPD (chronic obstructive pulmonary disease) ([place]); Heart murmur; Hyperlipidemia; Hypertension; Murmur, cardiac (1950); and Varicella (as child).   reports that she quit smoking about 4 years ago. Her smoking use included Cigarettes. She has a 50.00 pack-year smoking history. She quit smokeless tobacco use about 4 years ago.  Past Surgical History:  Procedure Laterality Date  . APPENDECTOMY     2008  . [place]  . Excision of Pelvic Absess, Right Ovary     2008    Allergies  Allergen Reactions  . Nickel Rash    Severe  rash to infection: pt is allergic to all metals other than sterling silver or gold jewelry.   [name] [Olmesartan] Swelling    Swelling of face and arms   . [name] [Valsartan] Swelling    Swelling of face and arms   . Lisinopril Cough  . Codeine Other (See Comments)    jittery  . Monosodium Glutamate Other (See Comments)    Facial swelling per pt    Immunization History  Administered Date(s) Administered  . Influenza Split 01/16/2011  . Influenza,inj,Quad PF,36+ Mos 02/01/2013, 02/27/2014, 04/07/2015  . Pneumococcal Conjugate-13 02/01/2013  . Pneumococcal Polysaccharide-23 10/29/2014  . Td 12/06/2011    Family History  Problem Relation Age of Onset  . Heart disease Mother     MI - fatal  . Hypertension Mother   . Stroke Father 20    fatal  . Alzheimer's disease Father   . Alzheimer's disease Brother   . Hyperlipidemia Brother   . Hypertension Brother   . Diabetes Brother   . Hypertension Brother   . Hyperlipidemia Brother      Current Outpatient Prescriptions:  .  albuterol (VENTOLIN HFA) 108 (90 Base) MCG/ACT inhaler, Inhale 1 puff into the lungs every 6 (six) hours as needed for wheezing., Disp: 18 each, Rfl: 5 .  amLODipine (NORVASC) 10 MG tablet, Take 1 tablet (10 mg total) by mouth daily., Disp: 90 tablet, Rfl: 1 .  Aspirin-Calcium Carbonate (BAYER WOMENS) 81-300 MG  TABS, Take 1 tablet by mouth daily with breakfast., Disp: , Rfl:  .  beclomethasone (QVAR) 80 MCG/ACT inhaler, Inhale 2 puffs into the lungs 2 (two) times daily., Disp: 4 Inhaler, Rfl: 0 .  carvedilol (COREG) 3.125 MG tablet, Take 1 tablet (3.125 mg total) by mouth 2 (two) times daily with a meal., Disp: 90 tablet, Rfl: 1 .  Cholecalciferol (VITAMIN D-3) 5000 UNITS TABS, Take 1 tablet by mouth once a week. , Disp: , Rfl:  .  hydrochlorothiazide (HYDRODIURIL) 25 MG tablet, Take 1 tablet (25 mg total) by mouth daily., Disp: 90 tablet, Rfl: 1 .  ipratropium-albuterol (DUONEB) 0.5-2.5 (3) MG/3ML SOLN, USE  ONE VIAL IN NEBULIZER 4 TIMES DAILY, Disp: 360 mL, Rfl: 0 .  lovastatin (MEVACOR) 20 MG tablet, Take 1 tablet (20 mg total) by mouth daily., Disp: 90 tablet, Rfl: 3    Review of Systems     Objective:   Physical Exam  Constitutional: She is oriented to person, place, and time. She appears well-developed and well-nourished. No distress.  HENT:  Head: Normocephalic and atraumatic.  Right Ear: External ear normal.  Left Ear: External ear normal.  Mouth/Throat: Oropharynx is clear and moist. No oropharyngeal exudate.  Eyes: Conjunctivae and EOM are normal. Pupils are equal, round, and reactive to light. Right eye exhibits no discharge. Left eye exhibits no discharge. No scleral icterus.  Neck: Normal range of motion. Neck supple. No JVD present. No tracheal deviation present. No thyromegaly present.  Cardiovascular: Normal rate, regular rhythm, normal heart sounds and intact distal pulses.  Exam reveals no gallop and no friction rub.   No murmur heard. Pulmonary/Chest: Effort normal and breath sounds normal. No respiratory distress. She has no wheezes. She has no rales. She exhibits no tenderness.  Abdominal: Soft. Bowel sounds are normal. She exhibits no distension and no mass. There is no tenderness. There is no rebound and no guarding.  Musculoskeletal: Normal range of motion. She exhibits no edema or tenderness.  Lymphadenopathy:    She has no cervical adenopathy.  Neurological: She is alert and oriented to person, place, and time. She has normal reflexes. No cranial nerve deficit. She exhibits normal muscle tone. Coordination normal.  Skin: Skin is warm and dry. No rash noted. She is not diaphoretic. No erythema. No pallor.  Psychiatric: She has a normal mood and affect. Her behavior is normal. Judgment and thought content normal.  Vitals reviewed.   Vitals:   01/06/16 1008  BP: 132/64  Pulse: 79  SpO2: 94%  Weight: 213 lb (96.6 kg)  Height: 5' 4.5" (1.638 m)   Estimated body  mass index is 36 kg/m as calculated from the following:   Height as of this encounter: 5' 4.5" (1.638 m).   Weight as of this encounter: 213 lb (96.6 kg).      Assessment:       ICD-9-CM ICD-10-CM   1. COPD, severe ([place]) 496 J44.9   2. Dyspnea and respiratory abnormality 786.09 R06.00     R06.89        Plan:      - Unclear why this worsening shortness of breath - Could be related to severe COPD, possible COPD flare up because of the summer, physical deconditioning, weight and possible associated diastolic dysfunction - There is no evidence severe coronary artery disease causing shortness of breath - There is no evidence that there is anemia interstitial lung disease causing shortness of breath  Plan - Walk test on room air 01/06/2016 -  Order overnight oxygen study on room air - Continue his COPD inhalers and CPAP - Continue weight loss efforts - Try 5 day prednisone for possible COPD exacerbation ; Please take prednisone 40 mg x1 day, then 30 mg x1 day, then 20 mg x1 day, then 10 mg x1 day, and then 5 mg x1 day and stop - Refer pulmonary rehabilitation at [facility]  Follow-up - Spirometry pre-and postbronchodilator with DLCO in 3 months - Return to see Dr. Chase Caller in 3 months   Dr. Brand Males, M.D., [facility].C.P Pulmonary and Critical Care Medicine Staff Physician [place] [facility] Pager: [phone], If no answer or between  15:00h - 7:00h: call [phone]  01/06/2016 10:42 AM

## 2016-01-06 NOTE — Addendum Note (Signed)
Addended by: Len Blalock on: 01/06/2016 10:59 AM   Modules accepted: Orders

## 2016-01-08 ENCOUNTER — Encounter: Payer: Self-pay | Admitting: Internal Medicine

## 2016-01-08 ENCOUNTER — Ambulatory Visit (INDEPENDENT_AMBULATORY_CARE_PROVIDER_SITE_OTHER): Payer: PPO | Admitting: Internal Medicine

## 2016-01-08 ENCOUNTER — Other Ambulatory Visit (INDEPENDENT_AMBULATORY_CARE_PROVIDER_SITE_OTHER): Payer: PPO

## 2016-01-08 VITALS — BP 138/70 | HR 72 | Temp 98.2°F | Resp 16 | Ht 64.5 in | Wt 212.8 lb

## 2016-01-08 DIAGNOSIS — Z23 Encounter for immunization: Secondary | ICD-10-CM | POA: Diagnosis not present

## 2016-01-08 DIAGNOSIS — I10 Essential (primary) hypertension: Secondary | ICD-10-CM

## 2016-01-08 DIAGNOSIS — R7303 Prediabetes: Secondary | ICD-10-CM | POA: Diagnosis not present

## 2016-01-08 DIAGNOSIS — E118 Type 2 diabetes mellitus with unspecified complications: Secondary | ICD-10-CM | POA: Insufficient documentation

## 2016-01-08 DIAGNOSIS — L723 Sebaceous cyst: Secondary | ICD-10-CM | POA: Diagnosis not present

## 2016-01-08 LAB — BASIC METABOLIC PANEL
BUN: 18 mg/dL (ref 6–23)
CALCIUM: 9.6 mg/dL (ref 8.4–10.5)
CO2: 33 meq/L — AB (ref 19–32)
CREATININE: 0.85 mg/dL (ref 0.40–1.20)
Chloride: 100 mEq/L (ref 96–112)
GFR: 84.56 mL/min (ref >60.00)
GLUCOSE: 127 mg/dL — AB (ref 70–99)
Potassium: 3.8 mEq/L (ref 3.5–5.1)
Sodium: 138 mEq/L (ref 135–145)

## 2016-01-08 LAB — HEMOGLOBIN A1C: Hgb A1c MFr Bld: 6.5 % (ref 4.6–6.5)

## 2016-01-08 NOTE — Patient Instructions (Signed)
Hypertension Hypertension, commonly called high blood pressure, is when the force of blood pumping through your arteries is too strong. Your arteries are the blood vessels that carry blood from your heart throughout your body. A blood pressure reading consists of a higher number over a lower number, such as 110/72. The higher number (systolic) is the pressure inside your arteries when your heart pumps. The lower number (diastolic) is the pressure inside your arteries when your heart relaxes. Ideally you want your blood pressure below 120/80. Hypertension forces your heart to work harder to pump blood. Your arteries may become narrow or stiff. Having untreated or uncontrolled hypertension can cause heart attack, stroke, kidney disease, and other problems. RISK FACTORS Some risk factors for high blood pressure are controllable. Others are not.  Risk factors you cannot control include:   Race. You may be at higher risk if you are African American.  Age. Risk increases with age.  Gender. Men are at higher risk than women before age 45 years. After age 65, women are at higher risk than men. Risk factors you can control include:  Not getting enough exercise or physical activity.  Being overweight.  Getting too much fat, sugar, calories, or salt in your diet.  Drinking too much alcohol. SIGNS AND SYMPTOMS Hypertension does not usually cause signs or symptoms. Extremely high blood pressure (hypertensive crisis) may cause headache, anxiety, shortness of breath, and nosebleed. DIAGNOSIS To check if you have hypertension, your health care provider will measure your blood pressure while you are seated, with your arm held at the level of your heart. It should be measured at least twice using the same arm. Certain conditions can cause a difference in blood pressure between your right and left arms. A blood pressure reading that is higher than normal on one occasion does not mean that you need treatment. If  it is not clear whether you have high blood pressure, you may be asked to return on a different day to have your blood pressure checked again. Or, you may be asked to monitor your blood pressure at home for 1 or more weeks. TREATMENT Treating high blood pressure includes making lifestyle changes and possibly taking medicine. Living a healthy lifestyle can help lower high blood pressure. You may need to change some of your habits. Lifestyle changes may include:  Following the DASH diet. This diet is high in fruits, vegetables, and whole grains. It is low in salt, red meat, and added sugars.  Keep your sodium intake below 2,300 mg per day.  Getting at least 30-45 minutes of aerobic exercise at least 4 times per week.  Losing weight if necessary.  Not smoking.  Limiting alcoholic beverages.  Learning ways to reduce stress. Your health care provider may prescribe medicine if lifestyle changes are not enough to get your blood pressure under control, and if one of the following is true:  You are 18-59 years of age and your systolic blood pressure is above 140.  You are 60 years of age or older, and your systolic blood pressure is above 150.  Your diastolic blood pressure is above 90.  You have diabetes, and your systolic blood pressure is over 140 or your diastolic blood pressure is over 90.  You have kidney disease and your blood pressure is above 140/90.  You have heart disease and your blood pressure is above 140/90. Your personal target blood pressure may vary depending on your medical conditions, your age, and other factors. HOME CARE INSTRUCTIONS    Have your blood pressure rechecked as directed by your health care provider.   Take medicines only as directed by your health care provider. Follow the directions carefully. Blood pressure medicines must be taken as prescribed. The medicine does not work as well when you skip doses. Skipping doses also puts you at risk for  problems.  Do not smoke.   Monitor your blood pressure at home as directed by your health care provider. SEEK MEDICAL CARE IF:   You think you are having a reaction to medicines taken.  You have recurrent headaches or feel dizzy.  You have swelling in your ankles.  You have trouble with your vision. SEEK IMMEDIATE MEDICAL CARE IF:  You develop a severe headache or confusion.  You have unusual weakness, numbness, or feel faint.  You have severe chest or abdominal pain.  You vomit repeatedly.  You have trouble breathing. MAKE SURE YOU:   Understand these instructions.  Will watch your condition.  Will get help right away if you are not doing well or get worse.   This information is not intended to replace advice given to you by your health care provider. Make sure you discuss any questions you have with your health care provider.   Document Released: 05/03/2005 Document Revised: 09/17/2014 Document Reviewed: 02/23/2013 Elsevier Interactive Patient Education 2016 [employer]  

## 2016-01-08 NOTE — Progress Notes (Signed)
Subjective:  Patient ID: Victoria [name], female    DOB: [birth year]  Age: 70 y.o. MRN: [id number]  CC: Hypertension   HPI Victoria [name] presents for a blood pressure check and concerns about a mass on the left, posterior aspect of her neck. She tells me the mass has been there for about a year but she thinks it's growing. It is not currently painful or swollen and does not drain blood or pus.  She also tells me her blood pressures been well controlled on the combination of amlodipine, hydrochlorothiazide and carvedilol. She's had no recent episodes of headache/chest pain/blurred vision/palpitations/edema/fatigue/or near syncope.  Outpatient Medications Prior to Visit  Medication Sig Dispense Refill  . albuterol (VENTOLIN HFA) 108 (90 Base) MCG/ACT inhaler Inhale 1 puff into the lungs every 6 (six) hours as needed for wheezing. 18 each 5  . amLODipine (NORVASC) 10 MG tablet Take 1 tablet (10 mg total) by mouth daily. 90 tablet 1  . Aspirin-Calcium Carbonate (BAYER WOMENS) 81-300 MG TABS Take 1 tablet by mouth daily with breakfast.    . beclomethasone (QVAR) 80 MCG/ACT inhaler Inhale 2 puffs into the lungs 2 (two) times daily. 4 Inhaler 0  . carvedilol (COREG) 3.125 MG tablet Take 1 tablet (3.125 mg total) by mouth 2 (two) times daily with a meal. 90 tablet 1  . Cholecalciferol (VITAMIN D-3) 5000 UNITS TABS Take 1 tablet by mouth once a week.     . hydrochlorothiazide (HYDRODIURIL) 25 MG tablet Take 1 tablet (25 mg total) by mouth daily. 90 tablet 1  . ipratropium-albuterol (DUONEB) 0.5-2.5 (3) MG/3ML SOLN USE ONE VIAL IN NEBULIZER 4 TIMES DAILY 360 mL 0  . lovastatin (MEVACOR) 20 MG tablet Take 1 tablet (20 mg total) by mouth daily. 90 tablet 3  . predniSONE (DELTASONE) 10 MG tablet 40mg  X1 day, 30mg  X1 day, 20mg  X1 day, 10mg  X1 day, 5mg  X1 day, then stop. 11 tablet 0   No facility-administered medications prior to visit.     ROS Review of Systems  Constitutional: Negative.  Negative  for activity change, appetite change, diaphoresis, fatigue and unexpected weight change.  HENT: Negative.  Negative for facial swelling and sinus pressure.   Eyes: Negative.   Respiratory: Negative.  Negative for cough, chest tightness, shortness of breath, wheezing and stridor.   Cardiovascular: Negative.  Negative for chest pain, palpitations and leg swelling.  Gastrointestinal: Negative for abdominal pain, diarrhea, nausea and vomiting.  Endocrine: Negative.   Genitourinary: Negative.   Musculoskeletal: Negative.  Negative for arthralgias, back pain, myalgias and neck pain.  Skin: Negative.  Negative for color change and rash.  Allergic/Immunologic: Negative.   Neurological: Negative.  Negative for dizziness.  Hematological: Negative.  Negative for adenopathy. Does not bruise/bleed easily.  Psychiatric/Behavioral: Negative.     Objective:  BP 138/70 (BP Location: Left Arm, Patient Position: Sitting, Cuff Size: Normal)   Pulse 72   Temp 98.2 F (36.8 C) (Oral)   Resp 16   Ht 5' 4.5" (1.638 m)   Wt 212 lb 12 oz (96.5 kg)   SpO2 90%   BMI 35.95 kg/m   BP Readings from Last 3 Encounters:  01/08/16 138/70  01/06/16 132/64  11/05/15 128/74    Wt Readings from Last 3 Encounters:  01/08/16 212 lb 12 oz (96.5 kg)  01/06/16 213 lb (96.6 kg)  12/11/15 214 lb (97.1 kg)    Physical Exam  Constitutional: She is oriented to person, place, and time. No distress.  HENT:  Mouth/Throat: Oropharynx is clear and moist. No oropharyngeal exudate.  Eyes: Conjunctivae are normal. Right eye exhibits no discharge. Left eye exhibits no discharge. No scleral icterus.  Neck: Normal range of motion. Neck supple. No JVD present. No tracheal deviation present. No thyromegaly present.    Cardiovascular: Normal rate, regular rhythm, normal heart sounds and intact distal pulses.  Exam reveals no gallop and no friction rub.   No murmur heard. Pulmonary/Chest: Effort normal and breath sounds normal.  No stridor. No respiratory distress. She has no wheezes. She has no rales. She exhibits no tenderness.  Abdominal: Soft. Bowel sounds are normal. She exhibits no distension and no mass. There is no tenderness. There is no rebound and no guarding.  Musculoskeletal: Normal range of motion. She exhibits no edema, tenderness or deformity.  Lymphadenopathy:    She has no cervical adenopathy.  Neurological: She is oriented to person, place, and time.  Skin: Skin is warm and dry. No rash noted. She is not diaphoretic. No erythema. No pallor.  Vitals reviewed.   Lab Results  Component Value Date   WBC 9.6 10/14/2015   HGB 13.6 10/14/2015   HCT 41.1 10/14/2015   PLT 254.0 10/14/2015   GLUCOSE 127 (H) 01/08/2016   CHOL 201 (H) 10/14/2015   TRIG 83.0 10/14/2015   HDL 61.30 10/14/2015   LDLDIRECT 121.0 09/05/2014   LDLCALC 123 (H) 10/14/2015   ALT 5 10/14/2015   AST 10 10/14/2015   NA 138 01/08/2016   K 3.8 01/08/2016   CL 100 01/08/2016   CREATININE 0.85 01/08/2016   BUN 18 01/08/2016   CO2 33 (H) 01/08/2016   TSH 0.47 10/14/2015   INR 1.01 05/28/2014   HGBA1C 6.5 01/08/2016    No results found.  Assessment & Plan:   Victoria [name] was seen today for hypertension.  Diagnoses and all orders for this visit:  Need for prophylactic vaccination and inoculation against influenza -     Flu Vaccine QUAD 36+ mos IM  Sebaceous cyst- I've asked her to see general surgery to consider having this mass excised. -     Ambulatory referral to General Surgery  Essential hypertension- her blood pressure is well-controlled, electrolytes and renal function are stable. -     Basic metabolic panel; Future  Prediabetes- her A1c is up to 6.5%, she is a new onset type II diabetic, no medications are needed at this time, she agrees to work on her lifestyle modifications. -     Basic metabolic panel; Future -     Hemoglobin A1c; Future   I am having Victoria [name] maintain her Vitamin D-3, Aspirin-Calcium  Carbonate, beclomethasone, carvedilol, amLODipine, albuterol, hydrochlorothiazide, lovastatin, ipratropium-albuterol, and predniSONE.  No orders of the defined types were placed in this encounter.    Follow-up: Return in about 6 months (around 07/10/2016).  Victoria Calico, MD

## 2016-01-08 NOTE — Progress Notes (Signed)
Pre visit review using our clinic review tool, if applicable. No additional management support is needed unless otherwise documented below in the visit note. 

## 2016-01-12 ENCOUNTER — Encounter: Payer: Self-pay | Admitting: Internal Medicine

## 2016-01-13 ENCOUNTER — Other Ambulatory Visit: Payer: Self-pay | Admitting: Internal Medicine

## 2016-01-13 DIAGNOSIS — G4733 Obstructive sleep apnea (adult) (pediatric): Secondary | ICD-10-CM | POA: Diagnosis not present

## 2016-01-15 ENCOUNTER — Telehealth: Payer: Self-pay | Admitting: Internal Medicine

## 2016-01-15 NOTE — Telephone Encounter (Signed)
Called and spoke with the pt  She states since starting on o2 her "right sinus feels stopped up" When she blows her nose she sees tiny specks of blood  She otherwise is feeling well and the o2 "has done wonders for my breathing" Please advise thanks!

## 2016-01-15 NOTE — Telephone Encounter (Signed)
She should do saline nasal spray BID including at night before bed  Dr. Brand Males, M.D., [facility].C.P Pulmonary and Critical Care Medicine Staff Physician [place] [facility] Pager: [phone], If no answer or between  15:00h - 7:00h: call [phone]  01/15/2016 11:27 AM

## 2016-01-15 NOTE — Telephone Encounter (Signed)
Spoke with pt. She is aware of MR's recommendation. Nothing further was needed at this time.

## 2016-01-26 ENCOUNTER — Telehealth: Payer: Self-pay | Admitting: Internal Medicine

## 2016-01-26 DIAGNOSIS — J449 Chronic obstructive pulmonary disease, unspecified: Secondary | ICD-10-CM

## 2016-01-26 NOTE — Telephone Encounter (Signed)
Spoke with pt. She is wanting an order to be sent to [facility] for a POC. States that her current tanks are getting to heavy to carry to work. Order has been placed. Nothing further was needed.

## 2016-01-27 ENCOUNTER — Telehealth: Payer: Self-pay | Admitting: Internal Medicine

## 2016-01-27 DIAGNOSIS — J449 Chronic obstructive pulmonary disease, unspecified: Secondary | ICD-10-CM

## 2016-01-27 NOTE — Telephone Encounter (Signed)
ONO 01/12/16 - times < 88% at 0.50min. Does not qualify for night o2  Dr. Brand Males, M.D., [facility].C.P Pulmonary and Critical Care Medicine Staff Physician [place] [facility] Pager: [phone], If no answer or between  15:00h - 7:00h: call [phone]  01/27/2016 4:06 PM

## 2016-01-28 NOTE — Telephone Encounter (Signed)
Called and spoke to pt. Informed her of the results and recs per MR. Pt verbalized understanding and questioned if she can change to another DME company to allow for a POC lighter than 5 lbs, pt states [facility] does not have any POC or O2 system less than 5 lbs.   PCCs, do you know if another DME company takes her insurance and if she will be able to get a lighter O2 system.

## 2016-01-28 NOTE — Telephone Encounter (Signed)
There will have to be a new dme order for this Victoria [name]

## 2016-01-29 NOTE — Telephone Encounter (Signed)
Spoke with pt. Advised her that we are working on this for her. Order has been placed per [facility].

## 2016-01-29 NOTE — Telephone Encounter (Signed)
Spoke with pt. She is aware of [facility] response. Pt was very appreciative that we researched this for her. States that she will just continue her services with [facility]. Nothing further was needed at this time.

## 2016-01-29 NOTE — Telephone Encounter (Signed)
Patient called checking on status of the new order - pr

## 2016-01-29 NOTE — Telephone Encounter (Signed)
I have talked with the available DMEs and no one has a POC lighter than 5 lbs.  Patient will need to stay with [facility].

## 2016-02-06 DIAGNOSIS — R0689 Other abnormalities of breathing: Secondary | ICD-10-CM | POA: Diagnosis not present

## 2016-02-06 DIAGNOSIS — J449 Chronic obstructive pulmonary disease, unspecified: Secondary | ICD-10-CM | POA: Diagnosis not present

## 2016-02-06 DIAGNOSIS — G4733 Obstructive sleep apnea (adult) (pediatric): Secondary | ICD-10-CM | POA: Diagnosis not present

## 2016-02-06 DIAGNOSIS — R0600 Dyspnea, unspecified: Secondary | ICD-10-CM | POA: Diagnosis not present

## 2016-02-10 ENCOUNTER — Ambulatory Visit (INDEPENDENT_AMBULATORY_CARE_PROVIDER_SITE_OTHER): Payer: PPO | Admitting: Internal Medicine

## 2016-02-10 ENCOUNTER — Encounter: Payer: Self-pay | Admitting: Internal Medicine

## 2016-02-10 VITALS — BP 140/62 | HR 85 | Temp 98.4°F

## 2016-02-10 DIAGNOSIS — E118 Type 2 diabetes mellitus with unspecified complications: Secondary | ICD-10-CM | POA: Diagnosis not present

## 2016-02-10 DIAGNOSIS — I714 Abdominal aortic aneurysm, without rupture, unspecified: Secondary | ICD-10-CM

## 2016-02-10 DIAGNOSIS — E785 Hyperlipidemia, unspecified: Secondary | ICD-10-CM | POA: Diagnosis not present

## 2016-02-10 DIAGNOSIS — I10 Essential (primary) hypertension: Secondary | ICD-10-CM

## 2016-02-10 DIAGNOSIS — I2510 Atherosclerotic heart disease of native coronary artery without angina pectoris: Secondary | ICD-10-CM | POA: Diagnosis not present

## 2016-02-10 MED ORDER — ATORVASTATIN CALCIUM 40 MG PO TABS: 40.0000 mg | ORAL_TABLET | Freq: Every day | ORAL | 3 refills | Status: DC

## 2016-02-10 NOTE — Patient Instructions (Signed)

## 2016-02-10 NOTE — Progress Notes (Signed)
Pre visit review using our clinic review tool, if applicable. No additional management support is needed unless otherwise documented below in the visit note. 

## 2016-02-10 NOTE — Progress Notes (Signed)
Subjective:  Patient ID: Victoria [name], female    DOB: 10/30/1945  Age: 70 y.o. MRN: [id number]  CC: Hypertension; Diabetes; and Hyperlipidemia   HPI [name] presents for follow-up on the above medical problems. She has not achieved her LDL goal and wants to change her statin therapy. She has had no side effects such as muscle or joint aches.  Outpatient Medications Prior to Visit  Medication Sig Dispense Refill  . albuterol (VENTOLIN HFA) 108 (90 Base) MCG/ACT inhaler Inhale 1 puff into the lungs every 6 (six) hours as needed for wheezing. 18 each 5  . amLODipine (NORVASC) 10 MG tablet Take 1 tablet (10 mg total) by mouth daily. 90 tablet 1  . Aspirin-Calcium Carbonate (BAYER WOMENS) 81-300 MG TABS Take 1 tablet by mouth daily with breakfast.    . beclomethasone (QVAR) 80 MCG/ACT inhaler Inhale 2 puffs into the lungs 2 (two) times daily. 4 Inhaler 0  . carvedilol (COREG) 3.125 MG tablet Take 1 tablet (3.125 mg total) by mouth 2 (two) times daily with a meal. 90 tablet 1  . Cholecalciferol (VITAMIN D-3) 5000 UNITS TABS Take 1 tablet by mouth once a week.     . hydrochlorothiazide (HYDRODIURIL) 25 MG tablet Take 1 tablet (25 mg total) by mouth daily. 90 tablet 1  . ipratropium-albuterol (DUONEB) 0.5-2.5 (3) MG/3ML SOLN USE ONE VIAL IN NEBULIZER 4 TIMES DAILY 360 mL 5  . lovastatin (MEVACOR) 20 MG tablet Take 1 tablet (20 mg total) by mouth daily. 90 tablet 3  . predniSONE (DELTASONE) 10 MG tablet 40mg  X1 day, 30mg  X1 day, 20mg  X1 day, 10mg  X1 day, 5mg  X1 day, then stop. 11 tablet 0   No facility-administered medications prior to visit.     ROS Review of Systems  Constitutional: Negative.  Negative for appetite change, chills, diaphoresis, fatigue, fever and unexpected weight change.  HENT: Negative.   Eyes: Negative.   Respiratory: Positive for shortness of breath and wheezing. Negative for cough, choking, chest tightness and stridor.   Cardiovascular: Negative.  Negative for  palpitations and leg swelling.  Gastrointestinal: Negative.  Negative for abdominal pain, constipation, diarrhea, nausea and vomiting.  Endocrine: Negative.   Genitourinary: Negative.  Negative for difficulty urinating and dysuria.  Musculoskeletal: Negative.  Negative for arthralgias, back pain, joint swelling, myalgias and neck pain.  Skin: Negative.   Allergic/Immunologic: Negative.   Neurological: Negative.  Negative for dizziness, tremors, light-headedness, numbness and headaches.  Hematological: Negative.   Psychiatric/Behavioral: Negative.     Objective:  BP 140/62 (BP Location: Left Arm, Patient Position: Sitting, Cuff Size: Normal)   Pulse 85   Temp 98.4 F (36.9 C) (Oral)   BP Readings from Last 3 Encounters:  02/10/16 140/62  01/08/16 138/70  01/06/16 132/64    Wt Readings from Last 3 Encounters:  01/08/16 212 lb 12 oz (96.5 kg)  01/06/16 213 lb (96.6 kg)  12/11/15 214 lb (97.1 kg)    Physical Exam  Constitutional: She is oriented to person, place, and time. No distress.  HENT:  Mouth/Throat: Oropharynx is clear and moist. No oropharyngeal exudate.  Eyes: Conjunctivae are normal. Right eye exhibits no discharge. Left eye exhibits no discharge. No scleral icterus.  Neck: Normal range of motion. Neck supple. No JVD present. No tracheal deviation present. No thyromegaly present.  Cardiovascular: Normal rate, regular rhythm, normal heart sounds and intact distal pulses.  Exam reveals no gallop and no friction rub.   No murmur heard. Pulmonary/Chest: Effort normal and  breath sounds normal. No stridor. No respiratory distress. She has no wheezes. She has no rales. She exhibits no tenderness.  Abdominal: Soft. Bowel sounds are normal. She exhibits no distension and no mass. There is no tenderness. There is no rebound and no guarding.  Musculoskeletal: Normal range of motion. She exhibits no edema, tenderness or deformity.  Lymphadenopathy:    She has no cervical  adenopathy.  Neurological: She is oriented to person, place, and time.  Skin: Skin is warm and dry. No rash noted. She is not diaphoretic. No erythema. No pallor.  Psychiatric: She has a normal mood and affect. Her behavior is normal. Judgment and thought content normal.  Vitals reviewed.   Lab Results  Component Value Date   WBC 9.6 10/14/2015   HGB 13.6 10/14/2015   HCT 41.1 10/14/2015   PLT 254.0 10/14/2015   GLUCOSE 127 (H) 01/08/2016   CHOL 201 (H) 10/14/2015   TRIG 83.0 10/14/2015   HDL 61.30 10/14/2015   LDLDIRECT 121.0 09/05/2014   LDLCALC 123 (H) 10/14/2015   ALT 5 10/14/2015   AST 10 10/14/2015   NA 138 01/08/2016   K 3.8 01/08/2016   CL 100 01/08/2016   CREATININE 0.85 01/08/2016   BUN 18 01/08/2016   CO2 33 (H) 01/08/2016   TSH 0.47 10/14/2015   INR 1.01 05/28/2014   HGBA1C 6.5 01/08/2016    No results found.  Assessment & Plan:   Stellarose was seen today for hypertension, diabetes and hyperlipidemia.  Diagnoses and all orders for this visit:  Hyperlipidemia with target LDL less than 100- she has not achieved her LDL goal so I've asked her to stop taking lovastatin and we will upgrade her to a more effective statin  Type 2 diabetes mellitus with complication, without long-term current use of insulin ([place])- her A1c is at 6.5% so no medications are needed, she does agree to work on her lifestyle modifications to lower her blood sugar and will have an annual eye exam performed. -     atorvastatin (LIPITOR) 40 MG tablet; Take 1 tablet (40 mg total) by mouth daily. -     Ambulatory referral to Ophthalmology  Essential hypertension- her blood pressure is adequately well controlled.  Atherosclerosis of native coronary artery of native heart without angina pectoris -     atorvastatin (LIPITOR) 40 MG tablet; Take 1 tablet (40 mg total) by mouth daily.  Hyperlipidemia with target LDL less than 130 -     atorvastatin (LIPITOR) 40 MG tablet; Take 1 tablet (40 mg  total) by mouth daily.  AAA (abdominal aortic aneurysm) without rupture (HCC) -     atorvastatin (LIPITOR) 40 MG tablet; Take 1 tablet (40 mg total) by mouth daily.   I have discontinued Ms. Kilcrease lovastatin and predniSONE. I am also having her start on atorvastatin. Additionally, I am having her maintain her Vitamin D-3, Aspirin-Calcium Carbonate, beclomethasone, carvedilol, amLODipine, albuterol, hydrochlorothiazide, and ipratropium-albuterol.  Meds ordered this encounter  Medications  . atorvastatin (LIPITOR) 40 MG tablet    Sig: Take 1 tablet (40 mg total) by mouth daily.    Dispense:  90 tablet    Refill:  3     Follow-up: Return in about 4 months (around 06/11/2016).  Scarlette Calico, MD

## 2016-02-13 DIAGNOSIS — G4733 Obstructive sleep apnea (adult) (pediatric): Secondary | ICD-10-CM | POA: Diagnosis not present

## 2016-02-16 ENCOUNTER — Telehealth: Payer: Self-pay | Admitting: Internal Medicine

## 2016-02-16 MED ORDER — PREDNISONE 5 MG PO TABS: ORAL_TABLET | ORAL | 0 refills | Status: DC

## 2016-02-16 MED ORDER — FLUTICASONE FUROATE 100 MCG/ACT IN AEPB: 1.0000 | INHALATION_SPRAY | Freq: Every day | RESPIRATORY_TRACT | 2 refills | Status: DC

## 2016-02-16 NOTE — Telephone Encounter (Signed)
Pt stated that she got a letter from her insurance that they dont prefer the QVAR, and told the pt to request another inhaler that is similar to qvar.  MR please advise. thanks

## 2016-02-16 NOTE — Telephone Encounter (Signed)
Spoke with pt. She states that she is having a reaction to air fresheners. They have since been removed from her home. Reports chest tightness, wheezing and coughing. She has been using all of her inhaled medications with minimal relief. Would like have MR's recommendations.  MR - please advise. Thanks.

## 2016-02-16 NOTE — Telephone Encounter (Signed)
Called and spoke with pt and she is aware of change from qvar to arnuity per MR.  This has been sent to her pharmacy and she is aware.

## 2016-02-16 NOTE — Telephone Encounter (Signed)
Try Arnuity

## 2016-02-16 NOTE — Telephone Encounter (Signed)
Please take prednisone 40 mg x1 day, then 30 mg x1 day, then 20 mg x1 day, then 10 mg x1 day, and then 5 mg x1 day and stop   Dr. Brand Males, M.D., [facility].C.P Pulmonary and Critical Care Medicine Staff Physician [place] [facility] Pager: [phone], If no answer or between  15:00h - 7:00h: call [phone]  02/16/2016 2:56 PM

## 2016-02-17 ENCOUNTER — Ambulatory Visit: Payer: Self-pay | Admitting: Surgery

## 2016-02-17 ENCOUNTER — Other Ambulatory Visit: Payer: Self-pay | Admitting: Surgery

## 2016-02-17 DIAGNOSIS — E042 Nontoxic multinodular goiter: Secondary | ICD-10-CM | POA: Diagnosis not present

## 2016-02-17 DIAGNOSIS — E041 Nontoxic single thyroid nodule: Secondary | ICD-10-CM

## 2016-02-17 DIAGNOSIS — L723 Sebaceous cyst: Secondary | ICD-10-CM | POA: Diagnosis not present

## 2016-02-20 ENCOUNTER — Telehealth: Payer: Self-pay | Admitting: Internal Medicine

## 2016-02-20 DIAGNOSIS — J449 Chronic obstructive pulmonary disease, unspecified: Secondary | ICD-10-CM

## 2016-02-20 NOTE — Telephone Encounter (Signed)
Spoke with pt, states that [employer] had brought smaller 02 tanks to her home, but pt would prefer a POC.    MR ok to order POC eval for pt?  Thanks

## 2016-02-20 NOTE — Telephone Encounter (Signed)
Spoke with patient-aware that MR is okay to evaluate for POC. Order placed and nothing more needed at this time.

## 2016-02-20 NOTE — Telephone Encounter (Signed)
OK for POC  Dr. Brand Males, M.D., [facility].C.P Pulmonary and Critical Care Medicine Staff Physician [place] [facility] Pager: [phone], If no answer or between  15:00h - 7:00h: call [phone]  02/20/2016 5:01 PM

## 2016-02-23 ENCOUNTER — Encounter (HOSPITAL_COMMUNITY)
Admission: RE | Admit: 2016-02-23 | Discharge: 2016-02-23 | Disposition: A | Discharge status: Home / Self Care | Payer: PPO | Source: Ambulatory Visit | Attending: Internal Medicine | Admitting: Internal Medicine

## 2016-02-23 ENCOUNTER — Encounter (HOSPITAL_COMMUNITY): Payer: Self-pay

## 2016-02-23 VITALS — BP 153/66 | HR 86 | Resp 18 | Ht 64.0 in | Wt 219.4 lb

## 2016-02-23 DIAGNOSIS — J449 Chronic obstructive pulmonary disease, unspecified: Secondary | ICD-10-CM

## 2016-02-23 DIAGNOSIS — Z87891 Personal history of nicotine dependence: Secondary | ICD-10-CM | POA: Insufficient documentation

## 2016-02-23 NOTE — Progress Notes (Signed)
Victoria [name] 70 y.o. female Pulmonary Rehab Orientation Note Patient arrived today in Cardiac and Pulmonary Rehab for orientation to Pulmonary Rehab. She was transported from [employer] via wheel chair. She does carry portable oxygen, but does not require use unless she is physically exerting herself. She did not wear it for her appointment today and her saturations remained above 88%.. Per pt, she uses oxygen continuously at night with her CPAP, and intermittently during the day with exertions. Color good, skin warm and dry. Patient is oriented to time and place. [name] medical history, psychosocial health, and medications reviewed. Psychosocial assessment reveals pt lived alone until several months ago when her niece moved in with her. 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 Pt enjoys being extremely active with her church and has pursued an entrepreneurship in Forensic psychologist. Pt reports her stress level is low. Areas of stress/anxiety include Health. She has a new diagnosis this year of an AAA. She is being followed by vascular.  Pt does not exhibit signs of depression. PHQ2/9 score 0/na. Pt shows good  coping skills with positive outlook. She is offered emotional support and reassurance. Will continue to monitor and evaluate progress toward psychosocial goal(s) of remaining positive about the future of her health. Physical assessment reveals heart rate is normal. Breath sounds diminished. Grip strength equal, strong. Distal pulses palpable. Mild pitting edema noted to lower legs. Patient states she ate a lot of high sodium foods this weekend. Patient reports she does take medications as prescribed. Patient states she follows a Low Sodium diet. The patient reports no specific efforts to gain or lose weight.. [name] weight will be monitored closely. Demonstration and practice of PLB using pulse oximeter. Patient able to return  demonstration satisfactorily. Safety and hand hygiene in the exercise area reviewed with patient. Patient voices understanding of the information reviewed. Department expectations discussed with patient and achievable goals were set. The patient shows enthusiasm about attending the program and we look forward to working with this nice lady. The patient will not be scheduled for a 6 min walk test or to begin her exercise sessions until we have parameters for BP, HR, and exercise from her vascular surgeon related to her AAA. Will follow up with patient for scheduling once received.   45 minutes was spent on a variety of activities such as assessment of the patient, obtaining baseline data including height, weight, BMI, and grip strength, verifying medical history, allergies, and current medications, and teaching patient strategies for performing tasks with less respiratory effort with emphasis on pursed lip breathing.

## 2016-02-24 ENCOUNTER — Encounter (HOSPITAL_COMMUNITY): Payer: Self-pay | Admitting: *Deleted

## 2016-02-26 DIAGNOSIS — Z01419 Encounter for gynecological examination (general) (routine) without abnormal findings: Secondary | ICD-10-CM | POA: Diagnosis not present

## 2016-02-26 DIAGNOSIS — Z6837 Body mass index (BMI) 37.0-37.9, adult: Secondary | ICD-10-CM | POA: Diagnosis not present

## 2016-02-26 DIAGNOSIS — Z1231 Encounter for screening mammogram for malignant neoplasm of breast: Secondary | ICD-10-CM | POA: Diagnosis not present

## 2016-03-04 ENCOUNTER — Encounter (HOSPITAL_COMMUNITY)
Admission: RE | Admit: 2016-03-04 | Discharge: 2016-03-04 | Disposition: A | Discharge status: Home / Self Care | Payer: PPO | Source: Ambulatory Visit | Attending: Internal Medicine | Admitting: Internal Medicine

## 2016-03-04 DIAGNOSIS — J449 Chronic obstructive pulmonary disease, unspecified: Secondary | ICD-10-CM

## 2016-03-04 DIAGNOSIS — J438 Other emphysema: Secondary | ICD-10-CM

## 2016-03-04 NOTE — Progress Notes (Signed)
Pulmonary Individual Treatment Plan  Patient Details  Name: Victoria [name] MRN: [id number] Date of Birth: 11/24/1945 Referring Provider:   April Manson Pulmonary Rehab Walk Test from 03/04/2016 in [place]  Referring Provider  Dr. Chase Caller      Initial Encounter Date:  Flowsheet Row Pulmonary Rehab Walk Test from 03/04/2016 in [place]  Date  03/04/16  Referring Provider  Dr. Chase Caller      Visit Diagnosis: Chronic obstructive pulmonary disease, unspecified COPD type ([place])  Other emphysema ([place])  [name] Home Medications on Admission:   Current Outpatient Prescriptions:  .  albuterol (VENTOLIN HFA) 108 (90 Base) MCG/ACT inhaler, Inhale 1 puff into the lungs every 6 (six) hours as needed for wheezing., Disp: 18 each, Rfl: 5 .  amLODipine (NORVASC) 10 MG tablet, Take 1 tablet (10 mg total) by mouth daily., Disp: 90 tablet, Rfl: 1 .  Aspirin-Calcium Carbonate (BAYER WOMENS) 81-300 MG TABS, Take 1 tablet by mouth daily with breakfast., Disp: , Rfl:  .  atorvastatin (LIPITOR) 40 MG tablet, Take 1 tablet (40 mg total) by mouth daily. (Patient not taking: Reported on 02/23/2016), Disp: 90 tablet, Rfl: 3 .  carvedilol (COREG) 3.125 MG tablet, Take 1 tablet (3.125 mg total) by mouth 2 (two) times daily with a meal., Disp: 90 tablet, Rfl: 1 .  Cholecalciferol (VITAMIN D-3) 5000 UNITS TABS, Take 1 tablet by mouth once a week. , Disp: , Rfl:  .  Fluticasone Furoate (ARNUITY ELLIPTA) 100 MCG/ACT AEPB, Inhale 1 puff into the lungs daily., Disp: 30 each, Rfl: 2 .  hydrochlorothiazide (HYDRODIURIL) 25 MG tablet, Take 1 tablet (25 mg total) by mouth daily., Disp: 90 tablet, Rfl: 1 .  ipratropium-albuterol (DUONEB) 0.5-2.5 (3) MG/3ML SOLN, USE ONE VIAL IN NEBULIZER 4 TIMES DAILY, Disp: 360 mL, Rfl: 5 .  predniSONE (DELTASONE) 5 MG tablet, 40 mg x 1 day, 30 mg x 1 day, 20 mg x 1 day, 10 mg x 1 day, 5 mg x 1 day (Patient not taking:  Reported on 02/23/2016), Disp: 20 tablet, Rfl: 0  Past Medical History: Past Medical History:  Diagnosis Date  . Arthritis    left ankle, right knee, right SI joint, wrists  . COPD (chronic obstructive pulmonary disease) ([place])   . Diabetes mellitus without complication ([place])   . 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 Hyperlipidemia   . Hypertension   . Murmur, cardiac 1950  . Varicella as child    Tobacco Use: History  Smoking Status  . Former Smoker  . Packs/day: 1.00  . Years: 50.00  . Types: Cigarettes  . Quit date: 04/16/2011  Smokeless Tobacco  . Former Systems developer  . Quit date: 04/16/2011    Comment: quit that date when she had to go to ER     Labs: Recent Review Flowsheet Data    Labs for ITP Cardiac and Pulmonary Rehab Latest Ref Rng & Units 07/06/2012 02/11/2014 09/05/2014 10/14/2015 01/08/2016   Cholestrol 0 - 200 mg/dL 183 193 199 201(H) -   LDLCALC 0 - 99 mg/dL 107(H) - - 123(H) -   LDLDIRECT mg/dL - 103.7 121.0 - -   HDL >39.00 mg/dL 57.50 48.10 49.50 61.30 -   Trlycerides 0.0 - 149.0 mg/dL 92.0 429.0(H) 210.0(H) 83.0 -   Hemoglobin A1c 4.6 - 6.5 % - - - - 6.5      Capillary Blood Glucose: No results found for: GLUCAP  ADL UCSD:     Pulmonary Assessment Scores    Row Name 02/24/16 1559 02/24/16 1600       ADL UCSD   ADL Phase Entry  -    SOB Score total 52  -      CAT Score   CAT Score  - 13       Pulmonary Function Assessment:     Pulmonary Function Assessment - 02/23/16 0923      Breath   Bilateral Breath Sounds Decreased   Shortness of Breath Limiting activity;Yes      Exercise Target Goals: Date: 03/04/16  Exercise Program Goal: Individual exercise prescription set with THRR, safety & activity barriers. Participant demonstrates ability to understand and report RPE using BORG scale, to self-measure pulse accurately, and to acknowledge the importance of the exercise prescription.  Exercise Prescription Goal: Starting with  aerobic activity 30 plus minutes a day, 3 days per week for initial exercise prescription. Provide home exercise prescription and guidelines that participant acknowledges understanding prior to discharge.  Activity Barriers & Risk Stratification:     Activity Barriers & Cardiac Risk Stratification - 02/23/16 0918      Activity Barriers & Cardiac Risk Stratification   Activity Barriers Arthritis;Back Problems;Shortness of Breath;Deconditioning      6 Minute Walk:     6 Minute Walk    Row Name 03/04/16 1638         6 Minute Walk   Phase Initial     Distance 1100 feet     Walk Time -  5 minutes and 20 seconds     # of Rest Breaks 1  40 seconds     MPH 2.08     METS 2.61     RPE 13     Perceived Dyspnea  2     Symptoms No     Resting HR 83 bpm     Resting BP 140/80     Max Ex. HR 100 bpm     Max Ex. BP 150/70       Interval HR   Baseline HR 83     1 Minute HR 71     2 Minute HR 71     3 Minute HR 100     4 Minute HR 94     5 Minute HR 92     6 Minute HR 92     2 Minute Post HR 88     Interval Heart Rate? Yes       Interval Oxygen   Interval Oxygen? Yes     Baseline Oxygen Saturation % 93 %     Baseline Liters of Oxygen 2 L     1 Minute Oxygen Saturation % 92 %     1 Minute Liters of Oxygen 2 L     2 Minute Oxygen Saturation % 89 %     2 Minute Liters of Oxygen 2 L     3 Minute Oxygen Saturation % 87 %     3 Minute Liters of Oxygen 2 L     4 Minute Oxygen Saturation % 90 %     4 Minute Liters of Oxygen 3 L     5 Minute Oxygen Saturation % 94 %     5 Minute Liters of Oxygen 3 L     6 Minute Oxygen Saturation % 94 %     6 Minute Liters of Oxygen 3 L     2 Minute Post Oxygen Saturation %  97 %     2 Minute Post Liters of Oxygen 3 L        Initial Exercise Prescription:     Initial Exercise Prescription - 03/04/16 1600      Date of Initial Exercise RX and Referring Provider   Date 03/04/16   Referring Provider Dr. Chase Caller     Oxygen   Oxygen  Continuous   Liters 3     NuStep   Level 2   Minutes 17     Track   Laps 5   Minutes 17     Prescription Details   Frequency (times per week) 2   Duration Progress to 45 minutes of aerobic exercise without signs/symptoms of physical distress     Intensity   THRR 40-80% of Max Heartrate 59-118   Ratings of Perceived Exertion 11-13   Perceived Dyspnea 0-4     Progression   Progression Continue progressive overload as per policy without signs/symptoms or physical distress.     Resistance Training   Training Prescription Yes   Weight Green bands   Reps 10-12      Perform Capillary Blood Glucose checks as needed.  Exercise Prescription Changes:   Exercise Comments:   Discharge Exercise Prescription (Final Exercise Prescription Changes):    Nutrition:  Target Goals: Understanding of nutrition guidelines, daily intake of sodium 1500mg , cholesterol 200mg , calories 30% from fat and 7% or less from saturated fats, daily to have 5 or more servings of fruits and vegetables.  Biometrics:     Pre Biometrics - 02/23/16 0929      Pre Biometrics   Grip Strength 27 kg       Nutrition Therapy Plan and Nutrition Goals:   Nutrition Discharge: Rate Your Plate Scores:   Psychosocial: Target Goals: Acknowledge presence or absence of depression, maximize coping skills, provide positive support system. Participant is able to verbalize types and ability to use techniques and skills needed for reducing stress and depression.  Initial Review & Psychosocial Screening:     Initial Psych Review & Screening - 02/23/16 [place]? Yes     Barriers   Psychosocial barriers to participate in program There are no identifiable barriers or psychosocial needs.     Screening Interventions   Interventions Encouraged to exercise      Quality of Life Scores:     Quality of Life - 02/24/16 1600      Quality of Life Scores    Health/Function Pre 21.57 %   Socioeconomic Pre 20.39 %   Psych/Spiritual Pre 24.3 %   Family Pre 30 %   GLOBAL Pre 22.48 %      PHQ-9: Recent Review Flowsheet Data    Depression screen [facility] 2/9 02/23/2016 04/07/2015 01/28/2015 10/29/2014 10/07/2014   Decreased Interest 1 1 - 0 0   Down, Depressed, Hopeless 0 1 0 0 0   PHQ - 2 Score 1 2 0 0 0   Altered sleeping - 1 - - -   Tired, decreased energy - 1 - - -   Change in appetite - 1 - - -   Feeling bad or failure about yourself  - 0 - - -   Trouble concentrating - 0 - - -   Moving slowly or fidgety/restless - 0 - - -   Suicidal thoughts - 0 - - -   PHQ-9 Score - 5 - - -  Psychosocial Evaluation and Intervention:     Psychosocial Evaluation - 02/23/16 0925      Psychosocial Evaluation & Interventions   Interventions Encouraged to exercise with the program and follow exercise prescription      Psychosocial Re-Evaluation:  Education: Education Goals: Education classes will be provided on a weekly basis, covering required topics. Participant will state understanding/return demonstration of topics presented.  Learning Barriers/Preferences:     Learning Barriers/Preferences - 02/23/16 0919      Learning Barriers/Preferences   Learning Barriers None   Learning Preferences Group Instruction;Computer/Internet;Individual Instruction;Verbal Instruction;Written Material      Education Topics: Risk Factor Reduction:  -Group instruction that is supported by a PowerPoint presentation. Instructor discusses the definition of a risk factor, different risk factors for pulmonary disease, and how the heart and lungs work together.     Nutrition for Pulmonary Patient:  -Group instruction provided by PowerPoint slides, verbal discussion, and written materials to support subject matter. The instructor gives an explanation and review of healthy diet recommendations, which includes a discussion on weight management, recommendations for  fruit and vegetable consumption, as well as protein, fluid, caffeine, fiber, sodium, sugar, and alcohol. Tips for eating when patients are short of breath are discussed.   Pursed Lip Breathing:  -Group instruction that is supported by demonstration and informational handouts. Instructor discusses the benefits of pursed lip and diaphragmatic breathing and detailed demonstration on how to preform both.     Oxygen Safety:  -Group instruction provided by PowerPoint, verbal discussion, and written material to support subject matter. There is an overview of "What is Oxygen" and "Why do we need it".  Instructor also reviews how to create a safe environment for oxygen use, the importance of using oxygen as prescribed, and the risks of noncompliance. There is a brief discussion on traveling with oxygen and resources the patient may utilize.   Oxygen Equipment:  -Group instruction provided by [facility] Staff utilizing handouts, written materials, and equipment demonstrations.   Signs and Symptoms:  -Group instruction provided by written material and verbal discussion to support subject matter. Warning signs and symptoms of infection, stroke, and heart attack are reviewed and when to call the physician/911 reinforced. Tips for preventing the spread of infection discussed.   Advanced Directives:  -Group instruction provided by verbal instruction and written material to support subject matter. Instructor reviews Advanced Directive laws and proper instruction for filling out document.   Pulmonary Video:  -Group video education that reviews the importance of medication and oxygen compliance, exercise, good nutrition, pulmonary hygiene, and pursed lip and diaphragmatic breathing for the pulmonary patient.   Exercise for the Pulmonary Patient:  -Group instruction that is supported by a PowerPoint presentation. Instructor discusses benefits of exercise, core components of exercise, frequency, duration,  and intensity of an exercise routine, importance of utilizing pulse oximetry during exercise, safety while exercising, and options of places to exercise outside of rehab.     Pulmonary Medications:  -Verbally interactive group education provided by instructor with focus on inhaled medications and proper administration.   Anatomy and Physiology of the Respiratory System and Intimacy:  -Group instruction provided by PowerPoint, verbal discussion, and written material to support subject matter. Instructor reviews respiratory cycle and anatomical components of the respiratory system and their functions. Instructor also reviews differences in obstructive and restrictive respiratory diseases with examples of each. Intimacy, Sex, and Sexuality differences are reviewed with a discussion on how relationships can change when diagnosed with pulmonary disease. Common sexual  concerns are reviewed.   Knowledge Questionnaire Score:     Knowledge Questionnaire Score - 02/24/16 1559      Knowledge Questionnaire Score   Pre Score 9/13      Core Components/Risk Factors/Patient Goals at Admission:     Personal Goals and Risk Factors at Admission - 02/23/16 0923      Core Components/Risk Factors/Patient Goals on Admission    Weight Management Yes;Obesity   Intervention Obesity: Provide education and appropriate resources to help participant work on and attain dietary goals.;Weight Management/Obesity: Establish reasonable short term and long term weight goals.   Expected Outcomes Short Term: Continue to assess and modify interventions until short term weight is achieved;Long Term: Adherence to nutrition and physical activity/exercise program aimed toward attainment of established weight goal   Improve shortness of breath with ADL's Yes   Intervention Provide education, individualized exercise plan and daily activity instruction to help decrease symptoms of SOB with activities of daily living.   Expected  Outcomes Short Term: Achieves a reduction of symptoms when performing activities of daily living.   Increase knowledge of respiratory medications and ability to use respiratory devices properly  Yes   Intervention Provide education and demonstration as needed of appropriate use of medications, inhalers, and oxygen therapy.   Expected Outcomes Short Term: Achieves understanding of medications use. Understands that oxygen is a medication prescribed by physician. Demonstrates appropriate use of inhaler and oxygen therapy.   Hypertension Yes   Intervention Provide education on lifestyle modifcations including regular physical activity/exercise, weight management, moderate sodium restriction and increased consumption of fresh fruit, vegetables, and low fat dairy, alcohol moderation, and smoking cessation.;Monitor prescription use compliance.   Expected Outcomes Short Term: Continued assessment and intervention until BP is < 140/88mm HG in hypertensive participants. < 130/39mm HG in hypertensive participants with diabetes, heart failure or chronic kidney disease.;Long Term: Maintenance of blood pressure at goal levels.      Core Components/Risk Factors/Patient Goals Review:    Core Components/Risk Factors/Patient Goals at Discharge (Final Review):    ITP Comments:   Comments:

## 2016-03-07 DIAGNOSIS — R0600 Dyspnea, unspecified: Secondary | ICD-10-CM | POA: Diagnosis not present

## 2016-03-07 DIAGNOSIS — J449 Chronic obstructive pulmonary disease, unspecified: Secondary | ICD-10-CM | POA: Diagnosis not present

## 2016-03-07 DIAGNOSIS — G4733 Obstructive sleep apnea (adult) (pediatric): Secondary | ICD-10-CM | POA: Diagnosis not present

## 2016-03-07 DIAGNOSIS — R0689 Other abnormalities of breathing: Secondary | ICD-10-CM | POA: Diagnosis not present

## 2016-03-08 ENCOUNTER — Telehealth: Payer: Self-pay | Admitting: Internal Medicine

## 2016-03-08 DIAGNOSIS — H2513 Age-related nuclear cataract, bilateral: Secondary | ICD-10-CM | POA: Diagnosis not present

## 2016-03-08 DIAGNOSIS — H524 Presbyopia: Secondary | ICD-10-CM | POA: Diagnosis not present

## 2016-03-08 DIAGNOSIS — J449 Chronic obstructive pulmonary disease, unspecified: Secondary | ICD-10-CM

## 2016-03-08 NOTE — Telephone Encounter (Signed)
Called and spoke with pt and she is requesting that MR write order for her to get a POC ( the size of a purse) she stated that the tanks are just too heavy and she cannot carry these around.  MR please advise. Thanks  Last ov--01/06/16  Allergies  Allergen Reactions  . Nickel Rash    Severe rash to infection: pt is allergic to all metals other than sterling silver or gold jewelry.   [name] [Olmesartan] Swelling    Swelling of face and arms   . [name] [Valsartan] Swelling    Swelling of face and arms   . Lisinopril Cough  . Codeine Other (See Comments)    jittery  . Monosodium Glutamate Other (See Comments)    Facial swelling per pt

## 2016-03-09 NOTE — Telephone Encounter (Signed)
Ok for [employer]  Dr. Brand Males, M.D., F.C.C.P Pulmonary and Critical Care Medicine Staff Physician [facility] Pager: [phone], If no answer or between  15:00h - 7:00h: call [phone]  03/09/2016 8:31 AM

## 2016-03-09 NOTE — Telephone Encounter (Signed)
Spoke with pt. She is aware that we will place this order for her. Order has been placed. Nothing further was needed.

## 2016-03-11 ENCOUNTER — Encounter (HOSPITAL_COMMUNITY)
Admission: RE | Admit: 2016-03-11 | Discharge: 2016-03-11 | Disposition: A | Discharge status: Home / Self Care | Payer: PPO | Source: Ambulatory Visit

## 2016-03-11 ENCOUNTER — Encounter (HOSPITAL_COMMUNITY)
Admission: RE | Admit: 2016-03-11 | Discharge: 2016-03-11 | Disposition: A | Discharge status: Home / Self Care | Payer: PPO | Source: Ambulatory Visit | Attending: Internal Medicine | Admitting: Internal Medicine

## 2016-03-11 VITALS — Wt 220.2 lb

## 2016-03-11 DIAGNOSIS — J438 Other emphysema: Secondary | ICD-10-CM

## 2016-03-11 DIAGNOSIS — Z87891 Personal history of nicotine dependence: Secondary | ICD-10-CM | POA: Diagnosis not present

## 2016-03-11 DIAGNOSIS — J449 Chronic obstructive pulmonary disease, unspecified: Secondary | ICD-10-CM | POA: Diagnosis not present

## 2016-03-11 NOTE — Progress Notes (Signed)
Daily Session Note  Patient Details  Name: Victoria [name] MRN: [id number] Date of Birth: [birth year] Referring Provider:   April [facility]on Pulmonary Rehab Walk Test from 03/04/2016 in [place]  Referring Provider  Dr. Chase Caller      Encounter Date: 03/11/2016  Check In:     Session Check In - 03/11/16 1350      Check-In   Location [facility]   Staff Present Rosebud Poles, RN, BSN;Molly diVincenzo, MS, ACSM RCEP, Exercise Physiologist;Maude Gloor Ysidro Evert, RN;Portia Rollene Rotunda, RN, BSN   Supervising physician immediately available to respond to emergencies [facility] immediately available   Physician(s) Dr. Alfredia Ferguson   Medication changes reported     No   Fall or balance concerns reported    No   Warm-up and Cool-down Performed as group-led instruction   Resistance Training Performed Yes   VAD Patient? No     Pain Assessment   Currently in Pain? No/denies   Multiple Pain Sites No      Capillary Blood Glucose: No results found for this or any previous visit (from the past 24 hour(s)).      Exercise Prescription Changes - 03/11/16 1600      Response to Exercise   Blood Pressure (Admit) 150/70   Blood Pressure (Exercise) 144/80   Blood Pressure (Exit) 150/64   Heart Rate (Admit) 88 bpm   Heart Rate (Exercise) 91 bpm   Heart Rate (Exit) 77 bpm   Oxygen Saturation (Admit) 88 %   Oxygen Saturation (Exercise) 95 %   Oxygen Saturation (Exit) 99 %   Rating of Perceived Exertion (Exercise) 15   Perceived Dyspnea (Exercise) 3   Duration Progress to 45 minutes of aerobic exercise without signs/symptoms of physical distress   Intensity --  40-80% intensity of THRR     Progression   Progression Continue to progress workloads to maintain intensity without signs/symptoms of physical distress.     Resistance Training   Training Prescription Yes   Weight green bands   Reps 10-12  10 minutes of strength training     Interval Training    Interval Training No     Oxygen   Oxygen Intermittent   Liters 3     NuStep   Level 2   Minutes 17   METs 1.4     Track   Laps 7   Minutes 17     Goals Met:  Exercise tolerated well No report of cardiac concerns or symptoms Strength training completed today  Goals Unmet:  Not Applicable  Comments: Service time is from 1330 to 1530. She attended answer and question session with Dr. Nelda Marseille today.    Dr. Rush Farmer is Medical Director for Pulmonary Rehab at [facility].

## 2016-03-13 ENCOUNTER — Other Ambulatory Visit: Payer: Self-pay | Admitting: Internal Medicine

## 2016-03-13 DIAGNOSIS — I10 Essential (primary) hypertension: Secondary | ICD-10-CM

## 2016-03-14 DIAGNOSIS — G4733 Obstructive sleep apnea (adult) (pediatric): Secondary | ICD-10-CM | POA: Diagnosis not present

## 2016-03-16 ENCOUNTER — Encounter (HOSPITAL_COMMUNITY)
Admission: RE | Admit: 2016-03-16 | Discharge: 2016-03-16 | Disposition: A | Discharge status: Home / Self Care | Payer: PPO | Source: Ambulatory Visit | Attending: Internal Medicine | Admitting: Internal Medicine

## 2016-03-16 VITALS — Wt 218.9 lb

## 2016-03-16 DIAGNOSIS — J449 Chronic obstructive pulmonary disease, unspecified: Secondary | ICD-10-CM

## 2016-03-16 DIAGNOSIS — J438 Other emphysema: Secondary | ICD-10-CM

## 2016-03-16 NOTE — Progress Notes (Signed)
Daily Session Note  Patient Details  Name: Victoria [name] MRN: [id number] Date of Birth: 28-Jul-1945 Referring Provider:   April Manson Pulmonary Rehab Walk Test from 03/04/2016 in [place]  Referring Provider  Dr. Chase Caller      Encounter Date: 03/16/2016  Check In:     Session Check In - 03/16/16 1331      Check-In   Location [facility]   Staff Present Su Hilt, MS, ACSM RCEP, Exercise Physiologist;Alyson Ki Leonia Reeves, RN, Luisa Hart, RN, BSN   Supervising physician immediately available to respond to emergencies [facility] immediately available   Physician(s) Dr. Alfredia Ferguson   Medication changes reported     No   Fall or balance concerns reported    No   Warm-up and Cool-down Performed as group-led instruction   Resistance Training Performed Yes   VAD Patient? No     Pain Assessment   Currently in Pain? No/denies   Multiple Pain Sites No      Capillary Blood Glucose: No results found for this or any previous visit (from the past 24 hour(s)).      Exercise Prescription Changes - 03/16/16 1500      Response to Exercise   Blood Pressure (Admit) 130/50   Blood Pressure (Exercise) 150/64   Blood Pressure (Exit) 130/66   Heart Rate (Admit) 78 bpm   Heart Rate (Exercise) 97 bpm   Heart Rate (Exit) 72 bpm   Oxygen Saturation (Admit) 90 %   Oxygen Saturation (Exercise) 94 %   Oxygen Saturation (Exit) 97 %   Rating of Perceived Exertion (Exercise) 13   Perceived Dyspnea (Exercise) 3   Duration Progress to 45 minutes of aerobic exercise without signs/symptoms of physical distress   Intensity THRR unchanged     Progression   Progression Continue to progress workloads to maintain intensity without signs/symptoms of physical distress.     Resistance Training   Training Prescription Yes   Weight green bands   Reps 10-12  10 minutes of strength training     Interval Training   Interval Training No     Oxygen   Oxygen Continuous   Liters 3     NuStep   Level 2   Minutes 17   METs 1.8     Track   Laps 16   Minutes 34     Goals Met:  Exercise tolerated well Strength training completed today  Goals Unmet:  Not Applicable  Comments:Service time is from 1330 to [place]    Dr. Rush Farmer is Medical Director for Pulmonary Rehab at [facility].

## 2016-03-17 DIAGNOSIS — I10 Essential (primary) hypertension: Secondary | ICD-10-CM | POA: Diagnosis not present

## 2016-03-17 DIAGNOSIS — G4733 Obstructive sleep apnea (adult) (pediatric): Secondary | ICD-10-CM | POA: Diagnosis not present

## 2016-03-17 DIAGNOSIS — Z86010 Personal history of colonic polyps: Secondary | ICD-10-CM | POA: Diagnosis not present

## 2016-03-17 DIAGNOSIS — J449 Chronic obstructive pulmonary disease, unspecified: Secondary | ICD-10-CM | POA: Diagnosis not present

## 2016-03-18 ENCOUNTER — Encounter (HOSPITAL_COMMUNITY): Admission: RE | Admit: 2016-03-18 | Payer: PPO | Source: Ambulatory Visit

## 2016-03-23 ENCOUNTER — Encounter (HOSPITAL_COMMUNITY)
Admission: RE | Admit: 2016-03-23 | Discharge: 2016-03-23 | Disposition: A | Discharge status: Home / Self Care | Payer: PPO | Source: Ambulatory Visit | Attending: Internal Medicine | Admitting: Internal Medicine

## 2016-03-23 ENCOUNTER — Ambulatory Visit
Admission: RE | Admit: 2016-03-23 | Discharge: 2016-03-23 | Disposition: A | Discharge status: Home / Self Care | Payer: PPO | Source: Ambulatory Visit | Attending: Surgery | Admitting: Surgery

## 2016-03-23 ENCOUNTER — Other Ambulatory Visit: Payer: PPO

## 2016-03-23 DIAGNOSIS — J438 Other emphysema: Secondary | ICD-10-CM

## 2016-03-23 DIAGNOSIS — J449 Chronic obstructive pulmonary disease, unspecified: Secondary | ICD-10-CM | POA: Insufficient documentation

## 2016-03-23 DIAGNOSIS — E041 Nontoxic single thyroid nodule: Secondary | ICD-10-CM

## 2016-03-23 DIAGNOSIS — E042 Nontoxic multinodular goiter: Secondary | ICD-10-CM | POA: Diagnosis not present

## 2016-03-23 DIAGNOSIS — Z87891 Personal history of nicotine dependence: Secondary | ICD-10-CM | POA: Insufficient documentation

## 2016-03-23 NOTE — Progress Notes (Signed)
Pulmonary Individual Treatment Plan  Patient Details  Name: Victoria [name] MRN: [id number] Date of Birth: 1946-04-24 Referring Provider:   April Manson Pulmonary Rehab Walk Test from 03/04/2016 in [place]  Referring Provider  Dr. Chase Caller      Initial Encounter Date:  Flowsheet Row Pulmonary Rehab Walk Test from 03/04/2016 in [place]  Date  03/04/16  Referring Provider  Dr. Chase Caller      Visit Diagnosis: Other emphysema ([place])  Chronic obstructive pulmonary disease, unspecified COPD type ([place])  [name] Home Medications on Admission:   Current Outpatient Prescriptions:  .  albuterol (VENTOLIN HFA) 108 (90 Base) MCG/ACT inhaler, Inhale 1 puff into the lungs every 6 (six) hours as needed for wheezing., Disp: 18 each, Rfl: 5 .  amLODipine (NORVASC) 10 MG tablet, Take 1 tablet (10 mg total) by mouth daily., Disp: 90 tablet, Rfl: 1 .  Aspirin-Calcium Carbonate (BAYER WOMENS) 81-300 MG TABS, Take 1 tablet by mouth daily with breakfast., Disp: , Rfl:  .  atorvastatin (LIPITOR) 40 MG tablet, Take 1 tablet (40 mg total) by mouth daily. (Patient not taking: Reported on 02/23/2016), Disp: 90 tablet, Rfl: 3 .  carvedilol (COREG) 3.125 MG tablet, TAKE ONE TABLET BY MOUTH TWICE DAILY WITH MEALS, Disp: 90 tablet, Rfl: 1 .  Cholecalciferol (VITAMIN D-3) 5000 UNITS TABS, Take 1 tablet by mouth once a week. , Disp: , Rfl:  .  Fluticasone Furoate (ARNUITY ELLIPTA) 100 MCG/ACT AEPB, Inhale 1 puff into the lungs daily., Disp: 30 each, Rfl: 2 .  hydrochlorothiazide (HYDRODIURIL) 25 MG tablet, Take 1 tablet (25 mg total) by mouth daily., Disp: 90 tablet, Rfl: 1 .  ipratropium-albuterol (DUONEB) 0.5-2.5 (3) MG/3ML SOLN, USE ONE VIAL IN NEBULIZER 4 TIMES DAILY, Disp: 360 mL, Rfl: 5 .  predniSONE (DELTASONE) 5 MG tablet, 40 mg x 1 day, 30 mg x 1 day, 20 mg x 1 day, 10 mg x 1 day, 5 mg x 1 day (Patient not taking: Reported on 02/23/2016),  Disp: 20 tablet, Rfl: 0  Past Medical History: Past Medical History:  Diagnosis Date  . Arthritis    left ankle, right knee, right SI joint, wrists  . COPD (chronic obstructive pulmonary disease) ([place])   . Diabetes mellitus without complication ([place])   . 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 Hyperlipidemia   . Hypertension   . Murmur, cardiac 1950  . Varicella as child    Tobacco Use: History  Smoking Status  . Former Smoker  . Packs/day: 1.00  . Years: 50.00  . Types: Cigarettes  . Quit date: 04/16/2011  Smokeless Tobacco  . Former Systems developer  . Quit date: 04/16/2011    Comment: quit that date when she had to go to ER     Labs: Recent Review Flowsheet Data    Labs for ITP Cardiac and Pulmonary Rehab Latest Ref Rng & Units 07/06/2012 02/11/2014 09/05/2014 10/14/2015 01/08/2016   Cholestrol 0 - 200 mg/dL 183 193 199 201(H) -   LDLCALC 0 - 99 mg/dL 107(H) - - 123(H) -   LDLDIRECT mg/dL - 103.7 121.0 - -   HDL >39.00 mg/dL 57.50 48.10 49.50 61.30 -   Trlycerides 0.0 - 149.0 mg/dL 92.0 429.0(H) 210.0(H) 83.0 -   Hemoglobin A1c 4.6 - 6.5 % - - - - 6.5      Capillary Blood Glucose: No results found for: GLUCAP   ADL UCSD:  Pulmonary Assessment Scores    Row Name 02/24/16 1559 02/24/16 1600       ADL UCSD   ADL Phase Entry  -    SOB Score total 52  -      CAT Score   CAT Score  - 13       Pulmonary Function Assessment:     Pulmonary Function Assessment - 02/23/16 0923      Breath   Bilateral Breath Sounds Decreased   Shortness of Breath Limiting activity;Yes      Exercise Target Goals:    Exercise Program Goal: Individual exercise prescription set with THRR, safety & activity barriers. Participant demonstrates ability to understand and report RPE using BORG scale, to self-measure pulse accurately, and to acknowledge the importance of the exercise prescription.  Exercise Prescription Goal: Starting with aerobic activity 30 plus minutes a day, 3  days per week for initial exercise prescription. Provide home exercise prescription and guidelines that participant acknowledges understanding prior to discharge.  Activity Barriers & Risk Stratification:     Activity Barriers & Cardiac Risk Stratification - 02/23/16 0918      Activity Barriers & Cardiac Risk Stratification   Activity Barriers Arthritis;Back Problems;Shortness of Breath;Deconditioning      6 Minute Walk:     6 Minute Walk    Row Name 03/04/16 1638         6 Minute Walk   Phase Initial     Distance 1100 feet     Walk Time -  5 minutes and 20 seconds     # of Rest Breaks 1  40 seconds     MPH 2.08     METS 2.61     RPE 13     Perceived Dyspnea  2     Symptoms No     Resting HR 83 bpm     Resting BP 140/80     Max Ex. HR 100 bpm     Max Ex. BP 150/70       Interval HR   Baseline HR 83     1 Minute HR 71     2 Minute HR 71     3 Minute HR 100     4 Minute HR 94     5 Minute HR 92     6 Minute HR 92     2 Minute Post HR 88     Interval Heart Rate? Yes       Interval Oxygen   Interval Oxygen? Yes     Baseline Oxygen Saturation % 93 %     Baseline Liters of Oxygen 2 L     1 Minute Oxygen Saturation % 92 %     1 Minute Liters of Oxygen 2 L     2 Minute Oxygen Saturation % 89 %     2 Minute Liters of Oxygen 2 L     3 Minute Oxygen Saturation % 87 %     3 Minute Liters of Oxygen 2 L     4 Minute Oxygen Saturation % 90 %     4 Minute Liters of Oxygen 3 L     5 Minute Oxygen Saturation % 94 %     5 Minute Liters of Oxygen 3 L     6 Minute Oxygen Saturation % 94 %     6 Minute Liters of Oxygen 3 L     2 Minute Post Oxygen Saturation % 97 %  2 Minute Post Liters of Oxygen 3 L        Initial Exercise Prescription:     Initial Exercise Prescription - 03/04/16 1600      Date of Initial Exercise RX and Referring Provider   Date 03/04/16   Referring Provider Dr. Chase Caller     Oxygen   Oxygen Continuous   Liters 3     NuStep   Level 2    Minutes 17     Track   Laps 5   Minutes 17     Prescription Details   Frequency (times per week) 2   Duration Progress to 45 minutes of aerobic exercise without signs/symptoms of physical distress     Intensity   THRR 40-80% of Max Heartrate 59-118   Ratings of Perceived Exertion 11-13   Perceived Dyspnea 0-4     Progression   Progression Continue progressive overload as per policy without signs/symptoms or physical distress.     Resistance Training   Training Prescription Yes   Weight Green bands   Reps 10-12      Perform Capillary Blood Glucose checks as needed.  Exercise Prescription Changes:     Exercise Prescription Changes    Row Name 03/11/16 1600 03/16/16 1500           Response to Exercise   Blood Pressure (Admit) 150/70 130/50      Blood Pressure (Exercise) 144/80 150/64      Blood Pressure (Exit) 150/64 130/66      Heart Rate (Admit) 88 bpm 78 bpm      Heart Rate (Exercise) 91 bpm 97 bpm      Heart Rate (Exit) 77 bpm 72 bpm      Oxygen Saturation (Admit) 88 % 90 %      Oxygen Saturation (Exercise) 95 % 94 %      Oxygen Saturation (Exit) 99 % 97 %      Rating of Perceived Exertion (Exercise) 15 13      Perceived Dyspnea (Exercise) 3 3      Duration Progress to 45 minutes of aerobic exercise without signs/symptoms of physical distress Progress to 45 minutes of aerobic exercise without signs/symptoms of physical distress      Intensity -  40-80% intensity of THRR THRR unchanged        Progression   Progression Continue to progress workloads to maintain intensity without signs/symptoms of physical distress. Continue to progress workloads to maintain intensity without signs/symptoms of physical distress.        Resistance Training   Training Prescription Yes Yes      Weight green bands green bands      Reps 10-12  10 minutes of strength training 10-12  10 minutes of strength training        Interval Training   Interval Training No No         Oxygen   Oxygen Intermittent Continuous      Liters 3 3        NuStep   Level 2 2      Minutes 17 17      METs 1.4 1.8        Track   Laps 7 16      Minutes 17 34         Exercise Comments:     Exercise Comments    Row Name 03/22/16 1020           Exercise Comments Patient has only attended two  sessions. Will cont. to monitor.          Discharge Exercise Prescription (Final Exercise Prescription Changes):     Exercise Prescription Changes - 03/16/16 1500      Response to Exercise   Blood Pressure (Admit) 130/50   Blood Pressure (Exercise) 150/64   Blood Pressure (Exit) 130/66   Heart Rate (Admit) 78 bpm   Heart Rate (Exercise) 97 bpm   Heart Rate (Exit) 72 bpm   Oxygen Saturation (Admit) 90 %   Oxygen Saturation (Exercise) 94 %   Oxygen Saturation (Exit) 97 %   Rating of Perceived Exertion (Exercise) 13   Perceived Dyspnea (Exercise) 3   Duration Progress to 45 minutes of aerobic exercise without signs/symptoms of physical distress   Intensity THRR unchanged     Progression   Progression Continue to progress workloads to maintain intensity without signs/symptoms of physical distress.     Resistance Training   Training Prescription Yes   Weight green bands   Reps 10-12  10 minutes of strength training     Interval Training   Interval Training No     Oxygen   Oxygen Continuous   Liters 3     NuStep   Level 2   Minutes 17   METs 1.8     Track   Laps 16   Minutes 34       Nutrition:  Target Goals: Understanding of nutrition guidelines, daily intake of sodium 1500mg , cholesterol 200mg , calories 30% from fat and 7% or less from saturated fats, daily to have 5 or more servings of fruits and vegetables.  Biometrics:     Pre Biometrics - 02/23/16 0929      Pre Biometrics   Grip Strength 27 kg       Nutrition Therapy Plan and Nutrition Goals:   Nutrition Discharge: Rate Your Plate Scores:   Psychosocial: Target Goals:  Acknowledge presence or absence of depression, maximize coping skills, provide positive support system. Participant is able to verbalize types and ability to use techniques and skills needed for reducing stress and depression.  Initial Review & Psychosocial Screening:     Initial Psych Review & Screening - 02/23/16 [place]? Yes     Barriers   Psychosocial barriers to participate in program There are no identifiable barriers or psychosocial needs.     Screening Interventions   Interventions Encouraged to exercise      Quality of Life Scores:     Quality of Life - 02/24/16 1600      Quality of Life Scores   Health/Function Pre 21.57 %   Socioeconomic Pre 20.39 %   Psych/Spiritual Pre 24.3 %   Family Pre 30 %   GLOBAL Pre 22.48 %      PHQ-9: Recent Review Flowsheet Data    Depression screen [facility] 2/9 02/23/2016 04/07/2015 01/28/2015 10/29/2014 10/07/2014   Decreased Interest 1 1 - 0 0   Down, Depressed, Hopeless 0 1 0 0 0   PHQ - 2 Score 1 2 0 0 0   Altered sleeping - 1 - - -   Tired, decreased energy - 1 - - -   Change in appetite - 1 - - -   Feeling bad or failure about yourself  - 0 - - -   Trouble concentrating - 0 - - -   Moving slowly or fidgety/restless - 0 - - -   Suicidal thoughts -  0 - - -   PHQ-9 Score - 5 - - -      Psychosocial Evaluation and Intervention:     Psychosocial Evaluation - 02/23/16 0925      Psychosocial Evaluation & Interventions   Interventions Encouraged to exercise with the program and follow exercise prescription      Psychosocial Re-Evaluation:     Psychosocial Re-Evaluation    [place] Name 03/16/16 1147 03/23/16 0647           Psychosocial Re-Evaluation   Interventions Encouraged to attend Pulmonary Rehabilitation for the exercise -      Comments No psychosocial issues identified at this time -      Continued Psychosocial Services Needed No  -        Education: Education Goals:  Education classes will be provided on a weekly basis, covering required topics. Participant will state understanding/return demonstration of topics presented.  Learning Barriers/Preferences:     Learning Barriers/Preferences - 02/23/16 0919      Learning Barriers/Preferences   Learning Barriers None   Learning Preferences Group Instruction;Computer/Internet;Individual Instruction;Verbal Instruction;Written Material      Education Topics: Risk Factor Reduction:  -Group instruction that is supported by a PowerPoint presentation. Instructor discusses the definition of a risk factor, different risk factors for pulmonary disease, and how the heart and lungs work together.     Nutrition for Pulmonary Patient:  -Group instruction provided by PowerPoint slides, verbal discussion, and written materials to support subject matter. The instructor gives an explanation and review of healthy diet recommendations, which includes a discussion on weight management, recommendations for fruit and vegetable consumption, as well as protein, fluid, caffeine, fiber, sodium, sugar, and alcohol. Tips for eating when patients are short of breath are discussed.   Pursed Lip Breathing:  -Group instruction that is supported by demonstration and informational handouts. Instructor discusses the benefits of pursed lip and diaphragmatic breathing and detailed demonstration on how to preform both.     Oxygen Safety:  -Group instruction provided by PowerPoint, verbal discussion, and written material to support subject matter. There is an overview of "What is Oxygen" and "Why do we need it".  Instructor also reviews how to create a safe environment for oxygen use, the importance of using oxygen as prescribed, and the risks of noncompliance. There is a brief discussion on traveling with oxygen and resources the patient may utilize.   Oxygen Equipment:  -Group instruction provided by [facility] Staff utilizing handouts,  written materials, and equipment demonstrations.   Signs and Symptoms:  -Group instruction provided by written material and verbal discussion to support subject matter. Warning signs and symptoms of infection, stroke, and heart attack are reviewed and when to call the physician/911 reinforced. Tips for preventing the spread of infection discussed.   Advanced Directives:  -Group instruction provided by verbal instruction and written material to support subject matter. Instructor reviews Advanced Directive laws and proper instruction for filling out document.   Pulmonary Video:  -Group video education that reviews the importance of medication and oxygen compliance, exercise, good nutrition, pulmonary hygiene, and pursed lip and diaphragmatic breathing for the pulmonary patient.   Exercise for the Pulmonary Patient:  -Group instruction that is supported by a PowerPoint presentation. Instructor discusses benefits of exercise, core components of exercise, frequency, duration, and intensity of an exercise routine, importance of utilizing pulse oximetry during exercise, safety while exercising, and options of places to exercise outside of rehab.     Pulmonary Medications:  -Verbally interactive  group education provided by instructor with focus on inhaled medications and proper administration.   Anatomy and Physiology of the Respiratory System and Intimacy:  -Group instruction provided by PowerPoint, verbal discussion, and written material to support subject matter. Instructor reviews respiratory cycle and anatomical components of the respiratory system and their functions. Instructor also reviews differences in obstructive and restrictive respiratory diseases with examples of each. Intimacy, Sex, and Sexuality differences are reviewed with a discussion on how relationships can change when diagnosed with pulmonary disease. Common sexual concerns are reviewed.   Knowledge Questionnaire Score:      Knowledge Questionnaire Score - 02/24/16 1559      Knowledge Questionnaire Score   Pre Score 9/13      Core Components/Risk Factors/Patient Goals at Admission:     Personal Goals and Risk Factors at Admission - 02/23/16 0923      Core Components/Risk Factors/Patient Goals on Admission    Weight Management Yes;Obesity   Intervention Obesity: Provide education and appropriate resources to help participant work on and attain dietary goals.;Weight Management/Obesity: Establish reasonable short term and long term weight goals.   Expected Outcomes Short Term: Continue to assess and modify interventions until short term weight is achieved;Long Term: Adherence to nutrition and physical activity/exercise program aimed toward attainment of established weight goal   Improve shortness of breath with ADL's Yes   Intervention Provide education, individualized exercise plan and daily activity instruction to help decrease symptoms of SOB with activities of daily living.   Expected Outcomes Short Term: Achieves a reduction of symptoms when performing activities of daily living.   Increase knowledge of respiratory medications and ability to use respiratory devices properly  Yes   Intervention Provide education and demonstration as needed of appropriate use of medications, inhalers, and oxygen therapy.   Expected Outcomes Short Term: Achieves understanding of medications use. Understands that oxygen is a medication prescribed by physician. Demonstrates appropriate use of inhaler and oxygen therapy.   Hypertension Yes   Intervention Provide education on lifestyle modifcations including regular physical activity/exercise, weight management, moderate sodium restriction and increased consumption of fresh fruit, vegetables, and low fat dairy, alcohol moderation, and smoking cessation.;Monitor prescription use compliance.   Expected Outcomes Short Term: Continued assessment and intervention until BP is < 140/74mm  HG in hypertensive participants. < 130/29mm HG in hypertensive participants with diabetes, heart failure or chronic kidney disease.;Long Term: Maintenance of blood pressure at goal levels.      Core Components/Risk Factors/Patient Goals Review:      Goals and Risk Factor Review    Row Name 03/16/16 1145 03/23/16 0646           Core Components/Risk Factors/Patient Goals Review   Personal Goals Review Weight Management/Obesity;Hypertension;Improve shortness of breath with ADL's;Develop more efficient breathing techniques such as purse lipped breathing and diaphragmatic breathing and practicing self-pacing with activity.;Increase Strength and Stamina -      Review Has attended 1 exercise session, too early to meet any program goals -      Expected Outcomes expect to see progress with goals in the next full 30 days. -         Core Components/Risk Factors/Patient Goals at Discharge (Final Review):      Goals and Risk Factor Review - 03/23/16 0646      Core Components/Risk Factors/Patient Goals Review   Personal Goals Review --   Review --   Expected Outcomes --      ITP Comments:   Comments: ITP REVIEW  Pt is making expected progress toward pulmonary rehab goals after completing 2 sessions. Recommend continued exercise, life style modification, education, and utilization of breathing techniques to increase stamina and strength and decrease shortness of breath with exertion.

## 2016-03-23 NOTE — Progress Notes (Signed)
Daily Session Note  Patient Details  Name: Victoria [name] MRN: [id number] Date of Birth: [birth year] Referring Provider:   April Manson Pulmonary Rehab Walk Test from 03/04/2016 in [place]  Referring Provider  Dr. Chase Caller      Encounter Date: 03/23/2016  Check In:     Session Check In - 03/23/16 1527      Check-In   Location [facility]   Staff Present Rosebud Poles, RN, BSN;Molly diVincenzo, MS, ACSM RCEP, Exercise Physiologist;Annedrea Rosezella [place], RN, MHA;Stephannie Broner Rollene Rotunda, RN, Roque Cash, RN   Supervising physician immediately available to respond to emergencies [facility] immediately available   Physician(s) Dr. Lonny Prude   Medication changes reported     No   Fall or balance concerns reported    No   Warm-up and Cool-down Performed as group-led instruction   Resistance Training Performed Yes   VAD Patient? No     Pain Assessment   Currently in Pain? No/denies   Multiple Pain Sites No      Capillary Blood Glucose: No results found for this or any previous visit (from the past 24 hour(s)).      Exercise Prescription Changes - 03/23/16 1618      Response to Exercise   Blood Pressure (Admit) 140/60   Blood Pressure (Exercise) 148/78   Blood Pressure (Exit) 132/60   Heart Rate (Admit) 80 bpm   Heart Rate (Exercise) 90 bpm   Heart Rate (Exit) 88 bpm   Oxygen Saturation (Admit) 90 %   Oxygen Saturation (Exercise) 93 %   Oxygen Saturation (Exit) 97 %   Rating of Perceived Exertion (Exercise) 14   Perceived Dyspnea (Exercise) 3   Duration Progress to 45 minutes of aerobic exercise without signs/symptoms of physical distress   Intensity THRR unchanged     Progression   Progression Continue to progress workloads to maintain intensity without signs/symptoms of physical distress.     Resistance Training   Training Prescription Yes   Weight green bands   Reps 10-12  10 minutes of strength training     Interval Training   Interval Training No     Oxygen   Oxygen Continuous   Liters 3     NuStep   Level 2   Minutes 17   METs 1.9     Track   Laps 13   Minutes 34     Goals Met:  Queuing for purse lip breathing No report of cardiac concerns or symptoms Strength training completed today  Goals Unmet:  Shortness of breath, used her rescue inhaler  Comments: Service time is from 1330 to 1510   Dr. Rush Farmer is Medical Director for Pulmonary Rehab at [facility].

## 2016-03-25 ENCOUNTER — Encounter (HOSPITAL_COMMUNITY)
Admission: RE | Admit: 2016-03-25 | Discharge: 2016-03-25 | Disposition: A | Discharge status: Home / Self Care | Payer: PPO | Source: Ambulatory Visit | Attending: Internal Medicine | Admitting: Internal Medicine

## 2016-03-25 ENCOUNTER — Telehealth: Payer: Self-pay | Admitting: Internal Medicine

## 2016-03-25 VITALS — Wt 222.7 lb

## 2016-03-25 DIAGNOSIS — J449 Chronic obstructive pulmonary disease, unspecified: Secondary | ICD-10-CM | POA: Diagnosis not present

## 2016-03-25 DIAGNOSIS — J438 Other emphysema: Secondary | ICD-10-CM

## 2016-03-25 NOTE — Telephone Encounter (Signed)
Called and spoke with pt and she stated that [facility] never told her this information.  She stated that they kept telling her that it was on back order and all kinds of other things.  She stated that she will call [facility] to see if they work with anyone other than [facility]. She stated that she will call back next week if needed.

## 2016-03-25 NOTE — Progress Notes (Signed)
Daily Session Note  Patient Details  Name: Victoria [name] MRN: [id number] Date of Birth: 1945-10-17 Referring Provider:   April Manson Pulmonary Rehab Walk Test from 03/04/2016 in [place]  Referring Provider  Dr. Chase Caller      Encounter Date: 03/25/2016  Check In:     Session Check In - 03/25/16 1330      Check-In   Location [facility]   Staff Present Rosebud Poles, RN, BSN;Molly diVincenzo, MS, ACSM RCEP, Exercise Physiologist;Lisa Ysidro Evert, Felipe Drone, RN, MHA;Portia Rollene Rotunda, RN, BSN   Supervising physician immediately available to respond to emergencies [facility] immediately available   Physician(s) Dr. Tana [place]   Medication changes reported     No   Fall or balance concerns reported    No   Warm-up and Cool-down Performed as group-led instruction   Resistance Training Performed Yes   VAD Patient? No     Pain Assessment   Currently in Pain? No/denies   Multiple Pain Sites No      Capillary Blood Glucose: No results found for this or any previous visit (from the past 24 hour(s)).      Exercise Prescription Changes - 03/25/16 1600      Response to Exercise   Blood Pressure (Admit) 140/72   Blood Pressure (Exercise) 160/80   Blood Pressure (Exit) 124/60   Heart Rate (Admit) 80 bpm   Heart Rate (Exercise) 85 bpm   Heart Rate (Exit) 77 bpm   Oxygen Saturation (Admit) 92 %   Oxygen Saturation (Exercise) 94 %   Oxygen Saturation (Exit) 96 %   Rating of Perceived Exertion (Exercise) 13   Perceived Dyspnea (Exercise) 1   Duration Progress to 45 minutes of aerobic exercise without signs/symptoms of physical distress   Intensity THRR unchanged     Progression   Progression Continue to progress workloads to maintain intensity without signs/symptoms of physical distress.     Resistance Training   Training Prescription Yes   Weight green bands   Reps 10-12  10 minutes of strength training     Interval Training   Interval Training No     Oxygen   Oxygen Continuous   Liters 3     NuStep   Level 2   Minutes 17   METs 1.9     Track   Laps 6   Minutes 17     Goals Met:  Exercise tolerated well Strength training completed today  Goals Unmet:  Not Applicable  Comments: Service time is from 1330 to 1545    Dr. Rush Farmer is Medical Director for Pulmonary Rehab at [facility].

## 2016-03-25 NOTE — Telephone Encounter (Signed)
Patient returning call - She can be reached at [phone]

## 2016-03-25 NOTE — Telephone Encounter (Signed)
Spoke with Arbie Cookey at [facility]. She states that due to the pt's insurance, they can't supply her with a POC. Arbie Cookey states that she faxed something to [place] making [place] aware of this. They can supply her with portable tanks, which they are doing currently. Per Arbie Cookey, the tanks that the pt currently has weigh the same as a POC.  lmtcb x1 for pt to discuss.

## 2016-03-30 ENCOUNTER — Encounter (HOSPITAL_COMMUNITY): Payer: PPO

## 2016-04-01 ENCOUNTER — Encounter (HOSPITAL_COMMUNITY): Payer: PPO

## 2016-04-01 ENCOUNTER — Telehealth: Payer: Self-pay | Admitting: Internal Medicine

## 2016-04-01 DIAGNOSIS — J449 Chronic obstructive pulmonary disease, unspecified: Secondary | ICD-10-CM

## 2016-04-01 NOTE — Telephone Encounter (Signed)
Spoke with Almyra Free at [facility], wants to know if we could prescribe a Simply Go Mini for pt.  Per pt's chart she wears 2lpm with exertion.    MR please advise.  Thanks!

## 2016-04-01 NOTE — Telephone Encounter (Signed)
That will be fine. 

## 2016-04-02 NOTE — Telephone Encounter (Signed)
Order has been placed for the simply go mini

## 2016-04-02 NOTE — Telephone Encounter (Signed)
Order has been placed per Leigh. Nothing further was needed.

## 2016-04-06 ENCOUNTER — Encounter (HOSPITAL_COMMUNITY): Payer: PPO

## 2016-04-07 ENCOUNTER — Encounter (HOSPITAL_COMMUNITY): Payer: Self-pay | Admitting: *Deleted

## 2016-04-07 DIAGNOSIS — R0600 Dyspnea, unspecified: Secondary | ICD-10-CM | POA: Diagnosis not present

## 2016-04-07 DIAGNOSIS — J449 Chronic obstructive pulmonary disease, unspecified: Secondary | ICD-10-CM | POA: Diagnosis not present

## 2016-04-07 DIAGNOSIS — G4733 Obstructive sleep apnea (adult) (pediatric): Secondary | ICD-10-CM | POA: Diagnosis not present

## 2016-04-07 DIAGNOSIS — R0689 Other abnormalities of breathing: Secondary | ICD-10-CM | POA: Diagnosis not present

## 2016-04-08 ENCOUNTER — Encounter (HOSPITAL_COMMUNITY): Payer: PPO

## 2016-04-09 ENCOUNTER — Encounter (HOSPITAL_COMMUNITY): Payer: Self-pay | Admitting: Surgery

## 2016-04-09 NOTE — H&P (Signed)
General Surgery [facility]  Victoria [name] DOB: 01-22-1946 Single / Language: Cleophus Molt / Race: Black or African American Female  History of Present Illness  Patient words: seb. cyst.  The patient is a 70 year old female who presents with an epidermal cyst.  Patient presents for follow-up of bilateral thyroid nodules as well as evaluation of an enlarging mass on the left posterior neck. This was noted by her primary physician, Dr. Scarlette Calico. Patient first noted approximately 1 year ago. It has gradually increased in size. It causes minor discomfort. Patient desires surgical excision. No other lesions present. No history of drainage or infection. Patient notes rare episodes of dysphagia. Otherwise no change in her thyroid examination. No new nodules. Patient has not had a thyroid ultrasound yet this year. Recent thyroid laboratory studies performed by her primary care physician are reportedly within normal limits.   Allergies Nickel Benicar *ANTIHYPERTENSIVES* [name] *ANTIHYPERTENSIVES* Lisinopril *ANTIHYPERTENSIVES* Codeine Phosphate *ANALGESICS - OPIOID*  Medication History  Atorvastatin Calcium (40MG  Tablet, Oral) Active. Turmeric (450MG  Capsule, Oral) Active. Medications Reconciled  Vitals Weight: 219.2 lb Height: 64.5in Body Surface Area: 2.05 m Body Mass Index: 37.04 kg/m  Temp.: 98.65F(Oral)  Pulse: 79 (Regular)  BP: 138/82 (Sitting, Left Arm, Standard)  Physical Exam The physical exam findings are as follows: Note:General - appears comfortable, no distress; not diaphorectic  HEENT - normocephalic; sclerae clear, gaze conjugate; mucous membranes moist, dentition good; voice normal  Neck - symmetric on extension; no palpable anterior or posterior cervical adenopathy; thyroid is nodular bilaterally without discrete or dominant mass; no tenderness; in the left posterior neck just above the hairline is a 2 cm discrete  well marginated mobile mass consistent with epidermal inclusion cyst or sebaceous cyst  Chest - clear bilaterally without rhonchi, rales, or wheeze  Cor - regular rhythm with normal rate; no significant murmur  Ext - non-tender without significant edema or lymphedema  Neuro - grossly intact; no tremor    Assessment & Plan  MULTIPLE THYROID NODULES (E04.2)  SEBACEOUS CYST (L72.3)  Patient presents with new complaint of sebaceous cyst on the left posterior neck which is gradually enlarging and causing minor discomfort. She desires surgical excision. We discussed doing this as an outpatient surgery under local anesthesia with sedation. She understands and wishes to proceed in the near future.  The risks and benefits of the procedure have been discussed at length with the patient. The patient understands the proposed procedure, potential alternative treatments, and the course of recovery to be expected. All of the patient's questions have been answered at this time. The patient wishes to proceed with surgery.  Patient has a stable thyroid examination. She has known bilateral nodules with previous benign cytopathology. We will schedule her for a follow-up thyroid ultrasound in November 2017.  Earnstine Regal, MD, [facility] Office: [phone]

## 2016-04-12 ENCOUNTER — Encounter (HOSPITAL_COMMUNITY): Payer: Self-pay | Admitting: *Deleted

## 2016-04-12 NOTE — Progress Notes (Signed)
Spoke with pt for pre-op call. Pt denies cardiac history except for a congenital heart murmur which she says has never given her any issues. She states she is "pre-diabetic", does not check her blood sugar at home. Last A1C was 6.5 on 01/08/16.

## 2016-04-13 ENCOUNTER — Ambulatory Visit (HOSPITAL_COMMUNITY): Payer: PPO | Admitting: Certified Registered Nurse Anesthetist

## 2016-04-13 ENCOUNTER — Encounter (HOSPITAL_COMMUNITY)
Admission: RE | Admit: 2016-04-13 | Discharge: 2016-04-13 | Disposition: A | Discharge status: Home / Self Care | Payer: PPO | Source: Ambulatory Visit | Attending: Internal Medicine | Admitting: Internal Medicine

## 2016-04-13 ENCOUNTER — Encounter (HOSPITAL_COMMUNITY): Payer: Self-pay | Admitting: *Deleted

## 2016-04-13 ENCOUNTER — Ambulatory Visit (HOSPITAL_COMMUNITY)
Admission: RE | Admit: 2016-04-13 | Discharge: 2016-04-13 | Disposition: A | Discharge status: Home / Self Care | Payer: PPO | Source: Ambulatory Visit | Attending: Surgery | Admitting: Surgery

## 2016-04-13 ENCOUNTER — Encounter (HOSPITAL_COMMUNITY)
Admission: RE | Disposition: A | Discharge status: Home / Self Care | Payer: Self-pay | Source: Ambulatory Visit | Attending: Surgery

## 2016-04-13 DIAGNOSIS — I10 Essential (primary) hypertension: Secondary | ICD-10-CM | POA: Insufficient documentation

## 2016-04-13 DIAGNOSIS — Z6838 Body mass index (BMI) 38.0-38.9, adult: Secondary | ICD-10-CM | POA: Insufficient documentation

## 2016-04-13 DIAGNOSIS — E119 Type 2 diabetes mellitus without complications: Secondary | ICD-10-CM | POA: Diagnosis not present

## 2016-04-13 DIAGNOSIS — I714 Abdominal aortic aneurysm, without rupture: Secondary | ICD-10-CM | POA: Diagnosis not present

## 2016-04-13 DIAGNOSIS — I2510 Atherosclerotic heart disease of native coronary artery without angina pectoris: Secondary | ICD-10-CM | POA: Diagnosis not present

## 2016-04-13 DIAGNOSIS — Z79899 Other long term (current) drug therapy: Secondary | ICD-10-CM | POA: Insufficient documentation

## 2016-04-13 DIAGNOSIS — Z87891 Personal history of nicotine dependence: Secondary | ICD-10-CM | POA: Insufficient documentation

## 2016-04-13 DIAGNOSIS — E669 Obesity, unspecified: Secondary | ICD-10-CM | POA: Insufficient documentation

## 2016-04-13 DIAGNOSIS — Z7951 Long term (current) use of inhaled steroids: Secondary | ICD-10-CM | POA: Diagnosis not present

## 2016-04-13 DIAGNOSIS — L723 Sebaceous cyst: Secondary | ICD-10-CM | POA: Diagnosis not present

## 2016-04-13 DIAGNOSIS — L720 Epidermal cyst: Secondary | ICD-10-CM | POA: Insufficient documentation

## 2016-04-13 DIAGNOSIS — J449 Chronic obstructive pulmonary disease, unspecified: Secondary | ICD-10-CM | POA: Diagnosis not present

## 2016-04-13 DIAGNOSIS — Z7982 Long term (current) use of aspirin: Secondary | ICD-10-CM | POA: Insufficient documentation

## 2016-04-13 HISTORY — DX: Dependence on supplemental oxygen: Z99.81

## 2016-04-13 HISTORY — PX: CYST REMOVAL NECK: SHX6281

## 2016-04-13 HISTORY — DX: Headache, unspecified: R51.9

## 2016-04-13 HISTORY — DX: Anemia, unspecified: D64.9

## 2016-04-13 HISTORY — DX: Dyspnea, unspecified: R06.00

## 2016-04-13 HISTORY — DX: Headache: R51

## 2016-04-13 HISTORY — DX: Pneumonia, unspecified organism: J18.9

## 2016-04-13 HISTORY — DX: Nontoxic multinodular goiter: E04.2

## 2016-04-13 HISTORY — DX: Prediabetes: R73.03

## 2016-04-13 LAB — CBC
HEMATOCRIT: 39.5 % (ref 36.0–46.0)
HEMOGLOBIN: 13.2 g/dL (ref 12.0–15.0)
MCH: 28.6 pg (ref 26.0–34.0)
MCHC: 33.4 g/dL (ref 30.0–36.0)
MCV: 85.7 fL (ref 78.0–100.0)
Platelets: 217 10*3/uL (ref 150–400)
RBC: 4.61 MIL/uL (ref 3.87–5.11)
RDW: 14.3 % (ref 11.5–15.5)
WBC: 8.5 10*3/uL (ref 4.0–10.5)

## 2016-04-13 LAB — BASIC METABOLIC PANEL
Anion gap: 8 (ref 5–15)
BUN: 10 mg/dL (ref 6–20)
CHLORIDE: 103 mmol/L (ref 101–111)
CO2: 29 mmol/L (ref 22–32)
Calcium: 9.4 mg/dL (ref 8.9–10.3)
Creatinine, Ser: 0.61 mg/dL (ref 0.44–1.00)
GFR calc non Af Amer: >60 mL/min (ref >60)
Glucose, Bld: 101 mg/dL — ABNORMAL HIGH (ref 65–99)
POTASSIUM: 3.5 mmol/L (ref 3.5–5.1)
SODIUM: 140 mmol/L (ref 135–145)

## 2016-04-13 LAB — GLUCOSE, CAPILLARY: GLUCOSE-CAPILLARY: 114 mg/dL — AB (ref 65–99)

## 2016-04-13 SURGERY — EXCISION, CYST, NECK
Anesthesia: Monitor Anesthesia Care | Site: Neck | Laterality: Left

## 2016-04-13 MED ORDER — CHLORHEXIDINE GLUCONATE CLOTH 2 % EX PADS: 6.0000 | MEDICATED_PAD | Freq: Once | CUTANEOUS | Status: DC

## 2016-04-13 MED ORDER — BUPIVACAINE HCL (PF) 0.25 % IJ SOLN
INTRAMUSCULAR | Status: AC
  Filled 2016-04-13: qty 30

## 2016-04-13 MED ORDER — BUPIVACAINE-EPINEPHRINE (PF) 0.25% -1:200000 IJ SOLN
INTRAMUSCULAR | Status: DC | PRN
  Administered 2016-04-13: 10 mL

## 2016-04-13 MED ORDER — LIDOCAINE HCL (CARDIAC) 20 MG/ML IV SOLN
INTRAVENOUS | Status: DC | PRN
  Administered 2016-04-13: 50 mg via INTRAVENOUS

## 2016-04-13 MED ORDER — PROPOFOL 10 MG/ML IV BOLUS
INTRAVENOUS | Status: AC
  Filled 2016-04-13: qty 20

## 2016-04-13 MED ORDER — PROPOFOL 500 MG/50ML IV EMUL
INTRAVENOUS | Status: DC | PRN
  Administered 2016-04-13: 30 ug/kg/min via INTRAVENOUS

## 2016-04-13 MED ORDER — FENTANYL CITRATE (PF) 100 MCG/2ML IJ SOLN
INTRAMUSCULAR | Status: DC | PRN
  Administered 2016-04-13 (×2): 25 ug via INTRAVENOUS

## 2016-04-13 MED ORDER — FENTANYL CITRATE (PF) 100 MCG/2ML IJ SOLN
INTRAMUSCULAR | Status: AC
  Filled 2016-04-13: qty 2

## 2016-04-13 MED ORDER — ROCURONIUM BROMIDE 10 MG/ML (PF) SYRINGE
PREFILLED_SYRINGE | INTRAVENOUS | Status: AC
  Filled 2016-04-13: qty 30

## 2016-04-13 MED ORDER — BUPIVACAINE-EPINEPHRINE (PF) 0.25% -1:200000 IJ SOLN
INTRAMUSCULAR | Status: AC
  Filled 2016-04-13: qty 30

## 2016-04-13 MED ORDER — 0.9 % SODIUM CHLORIDE (POUR BTL) OPTIME
TOPICAL | Status: DC | PRN
  Administered 2016-04-13: 1000 mL

## 2016-04-13 MED ORDER — CEFAZOLIN SODIUM-DEXTROSE 2-4 GM/100ML-% IV SOLN
2.0000 g | INTRAVENOUS | Status: AC
  Administered 2016-04-13: 2 g via INTRAVENOUS
  Filled 2016-04-13: qty 100

## 2016-04-13 MED ORDER — HYDROCODONE-ACETAMINOPHEN 5-325 MG PO TABS: 1.0000 | ORAL_TABLET | ORAL | 0 refills | Status: DC | PRN

## 2016-04-13 MED ORDER — LACTATED RINGERS IV SOLN
INTRAVENOUS | Status: DC | PRN
  Administered 2016-04-13: 13:00:00 via INTRAVENOUS

## 2016-04-13 MED ORDER — LACTATED RINGERS IV SOLN
Freq: Once | INTRAVENOUS | Status: AC
  Administered 2016-04-13: 12:00:00 via INTRAVENOUS

## 2016-04-13 SURGICAL SUPPLY — 33 items
BLADE SURG 15 STRL LF DISP TIS (BLADE) ×1 IMPLANT
BLADE SURG 15 STRL SS (BLADE) ×1
BLADE SURG ROTATE 9660 (MISCELLANEOUS) ×2 IMPLANT
CANISTER SUCTION 2500CC (MISCELLANEOUS) ×2 IMPLANT
COVER SURGICAL LIGHT HANDLE (MISCELLANEOUS) ×2 IMPLANT
DERMABOND ADVANCED (GAUZE/BANDAGES/DRESSINGS) ×1
DERMABOND ADVANCED .7 DNX12 (GAUZE/BANDAGES/DRESSINGS) ×1 IMPLANT
DRAPE UTILITY XL STRL (DRAPES) ×2 IMPLANT
ELECT CAUTERY BLADE 6.4 (BLADE) ×2 IMPLANT
ELECT REM PT RETURN 9FT ADLT (ELECTROSURGICAL) ×2
ELECTRODE REM PT RTRN 9FT ADLT (ELECTROSURGICAL) ×1 IMPLANT
GAUZE SPONGE 4X4 16PLY XRAY LF (GAUZE/BANDAGES/DRESSINGS) ×2 IMPLANT
GLOVE BIOGEL PI IND STRL 7.0 (GLOVE) ×1 IMPLANT
GLOVE BIOGEL PI INDICATOR 7.0 (GLOVE) ×1
GLOVE SURG ORTHO 8.0 STRL STRW (GLOVE) ×2 IMPLANT
GLOVE SURG SS PI 6.5 STRL IVOR (GLOVE) ×2 IMPLANT
GOWN STRL REUS W/ TWL LRG LVL3 (GOWN DISPOSABLE) ×1 IMPLANT
GOWN STRL REUS W/ TWL XL LVL3 (GOWN DISPOSABLE) ×1 IMPLANT
GOWN STRL REUS W/TWL LRG LVL3 (GOWN DISPOSABLE) ×1
GOWN STRL REUS W/TWL XL LVL3 (GOWN DISPOSABLE) ×1
KIT BASIN OR (CUSTOM PROCEDURE TRAY) ×2 IMPLANT
KIT ROOM TURNOVER OR (KITS) ×2 IMPLANT
NEEDLE HYPO 25X1 1.5 SAFETY (NEEDLE) ×2 IMPLANT
NS IRRIG 1000ML POUR BTL (IV SOLUTION) ×2 IMPLANT
PACK SURGICAL SETUP 50X90 (CUSTOM PROCEDURE TRAY) ×2 IMPLANT
PAD ARMBOARD 7.5X6 YLW CONV (MISCELLANEOUS) ×2 IMPLANT
PENCIL BUTTON HOLSTER BLD 10FT (ELECTRODE) ×2 IMPLANT
SPECIMEN JAR SMALL (MISCELLANEOUS) ×2 IMPLANT
SUT MNCRL AB 4-0 PS2 18 (SUTURE) ×4 IMPLANT
SYR BULB 3OZ (MISCELLANEOUS) ×2 IMPLANT
SYR CONTROL 10ML LL (SYRINGE) ×2 IMPLANT
TOWEL OR 17X24 6PK STRL BLUE (TOWEL DISPOSABLE) ×2 IMPLANT
TUBE CONNECTING 12X1/4 (SUCTIONS) ×2 IMPLANT

## 2016-04-13 NOTE — Interval H&P Note (Signed)
History and Physical Interval Note:  04/13/2016 12:23 PM  Victoria [name]  has presented today for surgery, with the diagnosis of SEBACEOUS CYST.  The various methods of treatment have been discussed with the patient and family. After consideration of risks, benefits and other options for treatment, the patient has consented to    Procedure(s): EXCISION OF SEBACEOUS CYST LEFT POSTERIOR NECK (Left) as a surgical intervention .    The patient's history has been reviewed, patient examined, no change in status, stable for surgery.  I have reviewed the patient's chart and labs.  Questions were answered to the patient's satisfaction.    Earnstine Regal, MD, [facility] Office: [place]

## 2016-04-13 NOTE — Anesthesia Preprocedure Evaluation (Signed)
Anesthesia Evaluation  Patient identified by MRN, date of birth, ID band Patient awake    Reviewed: Allergy & Precautions, NPO status   Airway Mallampati: I  TM Distance: >3 FB Neck ROM: Full    Dental  (+) Teeth Intact   Pulmonary shortness of breath and Long-Term Oxygen Therapy, COPD, former smoker,    breath sounds clear to auscultation       Cardiovascular hypertension, + CAD and + DOE   Rhythm:Regular Rate:Normal     Neuro/Psych  Headaches,    GI/Hepatic negative GI ROS, Neg liver ROS,   Endo/Other  diabetes  Renal/GU      Musculoskeletal  (+) Arthritis ,   Abdominal (+) + obese,   Peds  Hematology  (+) anemia ,   Anesthesia Other Findings   Reproductive/Obstetrics                             Anesthesia Physical Anesthesia Plan  ASA: III  Anesthesia Plan: MAC   Post-op Pain Management:    Induction: Intravenous  Airway Management Planned: Natural Airway and Simple Face Mask  Additional Equipment:   Intra-op Plan:   Post-operative Plan:   Informed Consent: I have reviewed the patients History and Physical, chart, labs and discussed the procedure including the risks, benefits and alternatives for the proposed anesthesia with the patient or authorized representative who has indicated his/her understanding and acceptance.     Plan Discussed with: CRNA  Anesthesia Plan Comments:         Anesthesia Quick Evaluation

## 2016-04-13 NOTE — Anesthesia Procedure Notes (Signed)
Procedure Name: MAC Date/Time: 04/13/2016 1:05 PM Performed by: Oletta Lamas Pre-anesthesia Checklist: Patient identified, Emergency Drugs available, Suction available and Patient being monitored Patient Re-evaluated:Patient Re-evaluated prior to inductionOxygen Delivery Method: Simple face mask

## 2016-04-13 NOTE — Discharge Instructions (Signed)
°  [facility] -- DISCHARGE INSTRUCTIONS  REMINDER:   Carry a list of your medications and allergies with you at all times  Call your pharmacy at least 1 week in advance to refill prescriptions  Do not mix any prescribed pain medicine with alcohol  Do not drive any motor vehicles while taking pain medication  Take medications with food unless otherwise directed  Follow-up appointments (date to return to physician): Please call [phone] to confirm your follow up appointment with your surgeon.  Call your Surgeon if you have:  Temperature greater than 101.0  Persistent nausea and vomiting  Severe uncontrolled pain  Redness, tenderness, or signs of infection (pain, swelling, redness, odor or    green/yellow discharge around the site)  Difficulty breathing, headache or visual disturbances  Hives  Persistent dizziness or light-headedness  Any other questions or concerns you may have after discharge  In an emergency, call 911 or go to an Emergency Department at a nearby hospital.  Diet: Begin with liquids, and if they are tolerated, resume your usual diet.  Avoid spicy, greasy or heavy foods.  If you have nausea or vomiting, go back to liquids.  If you cannot keep liquids down, call your doctor.  Avoid alcohol consumption while on prescription pain medications. Good nutrition promotes healing. Increase fiber and fluids.   ADDITIONAL INSTRUCTIONS: Leave Dermabond in place for 10 days.  May shower at any time.  Earnstine Regal, MD, [facility] Office: [phone]

## 2016-04-13 NOTE — Transfer of Care (Signed)
Immediate Anesthesia Transfer of Care Note  Patient: Victoria [name]  Procedure(s) Performed: Procedure(s): EXCISION OF SEBACEOUS CYST LEFT POSTERIOR NECK (Left)  Patient Location: PACU  Anesthesia Type:MAC  Level of Consciousness: awake, alert  and oriented  Airway & Oxygen Therapy: Patient Spontanous Breathing  Post-op Assessment: Report given to RN, Post -op Vital signs reviewed and stable and Patient moving all extremities  Post vital signs: Reviewed and stable  Last Vitals:  Vitals:   04/13/16 1122 04/13/16 1342  BP: 139/64 (!) 105/57  Pulse: 76 75  Resp: 18 18  Temp: 36.9 C 36.3 C    Last Pain:  Vitals:   04/13/16 1122  TempSrc: Oral         Complications: No apparent anesthesia complications

## 2016-04-13 NOTE — Brief Op Note (Signed)
04/13/2016  1:45 PM  PATIENT:  Victoria [name]  70 y.o. female  PRE-OPERATIVE DIAGNOSIS:  SEBACEOUS CYST  POST-OPERATIVE DIAGNOSIS:  SEBACEOUS CYST  PROCEDURE:  Procedure(s): EXCISION OF SEBACEOUS CYST LEFT POSTERIOR NECK (Left)  SURGEON:  Surgeon(s) and Role:    * Armandina Gemma, MD - Primary  PHYSICIAN ASSISTANT:   ASSISTANTS: none   ANESTHESIA:   local  EBL:  No intake/output data recorded.  BLOOD ADMINISTERED:none  DRAINS: none   LOCAL MEDICATIONS USED:  MARCAINE     SPECIMEN:  Excision  DISPOSITION OF SPECIMEN:  PATHOLOGY  COUNTS:  YES  TOURNIQUET:  * No tourniquets in log *  DICTATION: .Other Dictation: Dictation Number [id number]  PLAN OF CARE: Discharge to home after PACU  PATIENT DISPOSITION:  PACU - hemodynamically stable.   Delay start of Pharmacological VTE agent (>24hrs) due to surgical blood loss or risk of bleeding: yes  Earnstine Regal, MD, [facility] Office: [phone]

## 2016-04-14 ENCOUNTER — Encounter (HOSPITAL_COMMUNITY): Payer: Self-pay | Admitting: Surgery

## 2016-04-14 DIAGNOSIS — J449 Chronic obstructive pulmonary disease, unspecified: Secondary | ICD-10-CM | POA: Diagnosis not present

## 2016-04-14 DIAGNOSIS — R0602 Shortness of breath: Secondary | ICD-10-CM | POA: Diagnosis not present

## 2016-04-14 NOTE — Op Note (Deleted)
  The note originally documented on this encounter has been moved the the encounter in which it belongs.  

## 2016-04-14 NOTE — Anesthesia Postprocedure Evaluation (Signed)
Anesthesia Post Note  Patient: Victoria [name]  Procedure(s) Performed: Procedure(s) (LRB): EXCISION OF SEBACEOUS CYST LEFT POSTERIOR NECK (Left)  Patient location during evaluation: PACU Anesthesia Type: MAC Level of consciousness: awake and alert Pain management: pain level controlled Vital Signs Assessment: post-procedure vital signs reviewed and stable Respiratory status: spontaneous breathing, nonlabored ventilation, respiratory function stable and patient connected to nasal cannula oxygen Cardiovascular status: stable and blood pressure returned to baseline Anesthetic complications: no    Last Vitals:  Vitals:   04/13/16 1345 04/13/16 1400  BP: (!) 105/57 (!) 156/72  Pulse: 79 74  Resp: 19 18  Temp:      Last Pain:  Vitals:   04/13/16 1400  TempSrc:   PainSc: 0-No pain                 Kipling Graser,JAMES TERRILL

## 2016-04-14 NOTE — Op Note (Signed)
NAME:  Victoria [name], Victoria [name] NO.:  [id number]  MEDICAL RECORD NO.:  [id number]  LOCATION:                               FACILITY:  [facility]  PHYSICIAN:  Earnstine Regal, MD      DATE OF BIRTH:  [birth year]  DATE OF PROCEDURE:  04/13/2016                              OPERATIVE REPORT   PREOPERATIVE DIAGNOSIS:  Soft tissue mass, left posterior neck.  POSTOPERATIVE DIAGNOSIS:  Sebaceous cyst, left posterior neck.  PROCEDURE:  Excision of sebaceous cyst, left posterior neck.  SURGEON:  Earnstine Regal, MD  ANESTHESIA:  Local with intravenous sedation.  PREPARATION:  Betadine.  COMPLICATIONS:  None.  INDICATIONS:  The patient is a 70 year old female referred by Dr. Scarlette Calico for excision of a soft tissue mass over the left occiput.  The patient had noted this 1 year ago and it gradually increased in size and begun to cause minor discomfort.  The patient desired surgical excision.  BODY OF REPORT:  Procedure was done in OR #9 at the [facility]. The patient was brought to the operating room and placed in a supine position on the operating room table.  Following administration of intravenous sedation, the patient was turned to a right lateral decubitus position and supported with a bean bag.  The area in the left posterior neck is prepared by shaving a small amount of hair around the lesion.  The skin was prepped with Betadine.  The patient was then draped in the usual aseptic fashion.  After a time-out, the skin was infiltrated with local anesthetic with epinephrine.  Using a #15 blade, an elliptical incision was made so as to remove the skin overlying the mass. Dissection was carried in the subcutaneous tissues and hemostasis achieved with the electrocautery.  The mass measures approximately 2 cm in greatest dimension.  It was into the deep subcutaneous tissues.  It was excised in its entirety and submitted to Pathology for review.  Good hemostasis was obtained  throughout the wound.  Subcutaneous tissues were closed with interrupted 3-0 Vicryl sutures.  Skin was closed with interrupted 4-0 Monocryl subcuticular sutures.  Wound was washed and dried and Dermabond was applied as dressing.  The patient was awakened from anesthesia and brought to the recovery room.  The patient tolerated the procedure well.    Earnstine Regal, MD, [facility] Office: [phone]   TMG/MEDQ  D:  04/13/2016  T:  04/14/2016  Job:  [id number]  cc:   Scarlette Calico, MD

## 2016-04-15 ENCOUNTER — Ambulatory Visit (INDEPENDENT_AMBULATORY_CARE_PROVIDER_SITE_OTHER): Payer: PPO | Admitting: Internal Medicine

## 2016-04-15 ENCOUNTER — Inpatient Hospital Stay (HOSPITAL_COMMUNITY): Admission: RE | Admit: 2016-04-15 | Payer: PPO | Source: Ambulatory Visit

## 2016-04-15 ENCOUNTER — Encounter (HOSPITAL_COMMUNITY): Payer: PPO

## 2016-04-15 ENCOUNTER — Telehealth: Payer: Self-pay

## 2016-04-15 ENCOUNTER — Encounter: Payer: Self-pay | Admitting: Internal Medicine

## 2016-04-15 VITALS — BP 138/66 | HR 71 | Ht 64.0 in | Wt 222.6 lb

## 2016-04-15 DIAGNOSIS — J449 Chronic obstructive pulmonary disease, unspecified: Secondary | ICD-10-CM | POA: Diagnosis not present

## 2016-04-15 LAB — PULMONARY FUNCTION TEST
DL/VA % PRED: 60 %
DL/VA: 2.95 ml/min/mmHg/L
DLCO COR % PRED: 37 %
DLCO cor: 9.47 ml/min/mmHg
DLCO unc % pred: 36 %
DLCO unc: 9.45 ml/min/mmHg
FEF 25-75 POST: 0.2 L/s
FEF 25-75 Pre: 0.22 L/sec
FEF2575-%Change-Post: -5 %
FEF2575-%PRED-PRE: 12 %
FEF2575-%Pred-Post: 12 %
FEV1-%Change-Post: 1 %
FEV1-%Pred-Post: 34 %
FEV1-%Pred-Pre: 34 %
FEV1-Post: 0.65 L
FEV1-Pre: 0.64 L
FEV1FVC-%Change-Post: 4 %
FEV1FVC-%PRED-PRE: 59 %
FEV6-%Change-Post: -2 %
FEV6-%Pred-Post: 56 %
FEV6-%Pred-Pre: 57 %
FEV6-POST: 1.3 L
FEV6-Pre: 1.34 L
FEV6FVC-%Change-Post: 0 %
FEV6FVC-%PRED-POST: 99 %
FEV6FVC-%Pred-Pre: 99 %
FVC-%CHANGE-POST: -2 %
FVC-%PRED-POST: 57 %
FVC-%PRED-PRE: 58 %
FVC-POST: 1.38 L
FVC-PRE: 1.41 L
PRE FEV1/FVC RATIO: 45 %
Post FEV1/FVC ratio: 47 %
Post FEV6/FVC ratio: 95 %
Pre FEV6/FVC Ratio: 95 %

## 2016-04-15 NOTE — Progress Notes (Signed)
PFT done today. 04/15/16

## 2016-04-15 NOTE — Progress Notes (Signed)
Subjective:     Patient ID: Victoria [name], female   DOB: 1945/06/05, 70 y.o.   MRN: [id number]  HPI     OV 04/15/2016  Chief Complaint  Patient presents with  . Follow-up    Pt here after PFT. Pt overall she is at baseline. Pt c/o intermittent nonprod cough. Pt denies CP/tightness and f/c/s.     OV 01/06/2016  Chief Complaint  Patient presents with  . Follow-up    c/o worsening sob worsened by hot/humid weather.   Follow-up Gold stage III COPD he uses nighttime CPAP. Not on ambulatory or resting oxygen  The past one month she is reporting worsening dyspnea on exertion the house is not fully a condition. She thinks this might be due to the heat. It is progressive. Albuterol nebulizers and not fully helping her. She says that when she walks from the bedroom to the kitchen she gets short of breath. It is relieved by rest. There is no orthopnea paroxysmal nocturnal dyspnea or chest pain or worsening cough or sputum. But there is some increase in wheeze from baseline.  Relevant tests from recent times has been reviewed   Blood work May 2017: Hemoglobin 13.6 g percent and stable with a normal creatinine 0.76 mg percent Pulmonary function test June 2017: stage III COPD FEV1 0.88 L/47% in 10/21/2015 and 0.69 L/37% later in June 2017 CT chest lung cancer screening 10/09/2015: Emphysema with very small lung nodules Echo January 2016: Shows grade 1 diastolic dysfunction Nuclear medicine cardiac stress to 12/11/2015: Normal tests Lexiscan Myoview with ejection fraction 75%   OV 04/15/2016  Chief Complaint  Patient presents with  . Follow-up    Pt here after PFT. Pt overall she is at baseline. Pt c/o intermittent nonprod cough. Pt denies CP/tightness and f/c/s.      Follow-up stage III COPD. In the summer 2017 her lung function dipped from stage II to stage III COPD. She had spirometry today that shows post bronchodilator FEV1 0.65 L/34%. She continues to have dyspnea on exertion that  is relieved by oxygen. She is frustrated by this dyspnea although overall she's stable. She understands that she has COPD. obeseity and diastolic dysfunction contributing to this dyspnea. There are no new issues no acute exacerbations. She is on DuoNeb therapy along with inhaled steroid and oxygen at night with exertion. COPD cat score is 18 and is stable.   CAT COPD Symptom & Quality of Life Score (GSK trademark) 0 is no burden. 5 is highest burden 04/15/2016   Never Cough -> Cough all the time 2  No phlegm in chest -> Chest is full of phlegm 2  No chest tightness -> Chest feels very tight 3  No dyspnea for 1 flight stairs/hill -> Very dyspneic for 1 flight of stairs 4  No limitations for ADL at home -> Very limited with ADL at home 2  Confident leaving home -> Not at all confident leaving home 1  Sleep soundly -> Do not sleep soundly because of lung condition 1  Lots of Energy -> No energy at all 3  TOTAL Score (max 40)  18        Results for CHANNING, AMPARAN (MRN [id number]) as of 04/15/2016 14:13 - post bd  Ref. Range 10/21/2015 10:56 11/05/2015 12:08 04/15/2016 13:02  FEV1-Post Latest Units: L 0.88 0.69 0.65  FEV1-%Pred-Post Latest Units: % 47 37 34    Results for QUIANNA, CRANKSHAW (MRN [id number]) as of 04/15/2016 14:13  Ref. Range  04/15/2016 13:02  DLCO cor Latest Units: ml/min/mmHg 9.47  DLCO cor % pred Latest Units: % 37      has a past medical history of Abdominal aneurysm ([place]); Anemia; Arthritis; COPD (chronic obstructive pulmonary disease) ([place]); Dyspnea; Headache; Heart murmur; Hyperlipidemia; Hypertension; Multiple thyroid nodules; Murmur, cardiac (1950); Pneumonia; Pre-diabetes; Requires continuous at home supplemental oxygen; Sebaceous cyst; Sleep apnea; and Varicella (as child).   reports that she quit smoking about 5 years ago. Her smoking use included Cigarettes. She has a 50.00 pack-year smoking history. She has never used smokeless tobacco.  Past Surgical History:   Procedure Laterality Date  . APPENDECTOMY     2008  . [place]  . COLONOSCOPY    . CYST REMOVAL NECK Left 04/13/2016   Procedure: EXCISION OF SEBACEOUS CYST LEFT POSTERIOR NECK;  Surgeon: Armandina Gemma, MD;  Location: [facility];  Service: General;  Laterality: Left;  . Excision of Pelvic Absess, Right Ovary     2008  . TUBAL LIGATION  1980    Allergies  Allergen Reactions  . Nickel Rash    Severe rash to infection: pt is allergic to all metals other than sterling silver or gold jewelry.   [name] [Olmesartan] Swelling    Swelling of face and arms   . [name] [Valsartan] Swelling    Swelling of face and arms   . Lisinopril Cough  . Codeine Other (See Comments)    jittery  . Monosodium Glutamate Other (See Comments)    Facial swelling per pt  . Lead Acetate Rash    Immunization History  Administered Date(s) Administered  . Influenza Split 01/16/2011  . Influenza,inj,Quad PF,36+ Mos 02/01/2013, 02/27/2014, 04/07/2015, 01/08/2016  . Pneumococcal Conjugate-13 02/01/2013  . Pneumococcal Polysaccharide-23 10/29/2014  . Td 12/06/2011    Family History  Problem Relation Age of Onset  . Heart disease Mother     MI - fatal  . Hypertension Mother   . Stroke Father 75    fatal  . Alzheimer's disease Father   . Alzheimer's disease Brother   . Hyperlipidemia Brother   . Hypertension Brother   . Diabetes Brother   . Hypertension Brother   . Hyperlipidemia Brother      Current Outpatient Prescriptions:  .  albuterol (VENTOLIN HFA) 108 (90 Base) MCG/ACT inhaler, Inhale 1 puff into the lungs every 6 (six) hours as needed for wheezing., Disp: 18 each, Rfl: 5 .  amLODipine (NORVASC) 10 MG tablet, Take 1 tablet (10 mg total) by mouth daily., Disp: 90 tablet, Rfl: 1 .  aspirin-acetaminophen-caffeine (EXCEDRIN MIGRAINE) 250-250-65 MG tablet, Take 2 tablets by mouth daily as needed for headache., Disp: , Rfl:  .  Aspirin-Calcium Carbonate (BAYER WOMENS) 81-300 MG TABS,  Take 1 tablet by mouth daily with breakfast., Disp: , Rfl:  .  atorvastatin (LIPITOR) 40 MG tablet, Take 40 mg by mouth daily. Takes daily AM, Disp: , Rfl:  .  carvedilol (COREG) 3.125 MG tablet, TAKE ONE TABLET BY MOUTH TWICE DAILY WITH MEALS, Disp: 90 tablet, Rfl: 1 .  cholecalciferol (VITAMIN D) 1000 units tablet, Take 2,000 Units by mouth daily., Disp: , Rfl:  .  Fluticasone Furoate (ARNUITY ELLIPTA) 100 MCG/ACT AEPB, Inhale 1 puff into the lungs daily., Disp: 30 each, Rfl: 2 .  hydrochlorothiazide (HYDRODIURIL) 25 MG tablet, Take 1 tablet (25 mg total) by mouth daily., Disp: 90 tablet, Rfl: 1 .  HYDROcodone-acetaminophen (NORCO/VICODIN) 5-325 MG tablet, Take 1-2 tablets by mouth every 4 (  four) hours as needed for moderate pain., Disp: 10 tablet, Rfl: 0 .  ipratropium-albuterol (DUONEB) 0.5-2.5 (3) MG/3ML SOLN, USE ONE VIAL IN NEBULIZER 4 TIMES DAILY, Disp: 360 mL, Rfl: 5 .  OXYGEN, Inhale 2 L into the lungs. When exerting self, Disp: , Rfl:  .  sodium chloride (OCEAN) 0.65 % SOLN nasal spray, Place 1 spray into both nostrils as needed (dryness)., Disp: , Rfl:      Review of Systems     Objective:   Physical Exam  Constitutional: She is oriented to person, place, and time. She appears well-developed and well-nourished. No distress.  obese  HENT:  Head: Normocephalic and atraumatic.  Right Ear: External ear normal.  Left Ear: External ear normal.  Mouth/Throat: Oropharynx is clear and moist. No oropharyngeal exudate.  Eyes: Conjunctivae and EOM are normal. Pupils are equal, round, and reactive to light. Right eye exhibits no discharge. Left eye exhibits no discharge. No scleral icterus.  Neck: Normal range of motion. Neck supple. No JVD present. No tracheal deviation present. No thyromegaly present.  Cardiovascular: Normal rate, regular rhythm, normal heart sounds and intact distal pulses.  Exam reveals no gallop and no friction rub.   No murmur heard. Pulmonary/Chest: Effort normal  and breath sounds normal. No respiratory distress. She has no wheezes. She has no rales. She exhibits no tenderness.  Abdominal: Soft. Bowel sounds are normal. She exhibits no distension and no mass. There is no tenderness. There is no rebound and no guarding.  Has abdominal binder on  Musculoskeletal: Normal range of motion. She exhibits no edema or tenderness.  Lymphadenopathy:    She has no cervical adenopathy.  Neurological: She is alert and oriented to person, place, and time. She has normal reflexes. No cranial nerve deficit. She exhibits normal muscle tone. Coordination normal.  Skin: Skin is warm and dry. No rash noted. She is not diaphoretic. No erythema. No pallor.  Psychiatric: She has a normal mood and affect. Her behavior is normal. Judgment and thought content normal.  Vitals reviewed.  Vitals:   04/15/16 1407  BP: 138/66  Pulse: 71  SpO2: 92%  Weight: 222 lb 9.6 oz (101 kg)  Height: 5\' 4"  (1.626 m)    Estimated body mass index is 38.21 kg/m as calculated from the following:   Height as of this encounter: 5\' 4"  (1.626 m).   Weight as of this encounter: 222 lb 9.6 oz (101 kg).      Assessment:       ICD-9-CM ICD-10-CM   1. COPD, severe ([place]) 496 J44.9        Plan:      Copd stable but you are in stage 3 or severe category - you declined over summer 2017 Shortness of breath is due to copd, weight and diastolic dysfunction  Plan cotninue duononeb and arnuity and o2 at night/exertion Talk to PCP Scarlette Calico, MD and see if you can come off coreg that might negatively impact copd  followup 6 months or [place] if needed   Dr. Brand Males, M.D., [facility].C.P Pulmonary and Critical Care Medicine Staff Physician [facility] Pager: [phone], If no answer or between  15:00h - 7:00h: call [phone]  04/15/2016 2:24 PM

## 2016-04-15 NOTE — H&P (Signed)
Victoria [name] HPI: At this time the patient denies any problems with nausea, vomiting, fevers, chills, abdominal pain, diarrhea, constipation, hematochezia, melena, GERD, or dysphagia. The patient denies any known family history of colon cancers. No complaints of chest pain, SOB, MI, or sleep apnea. On 02/06/2013 the patient was identified to have a 1.2 cm ascending colon adenoma and a smaller transverse adenoma. Diverticula were found in the sigmoid colon.  Past Medical History:  Diagnosis Date  . Abdominal aneurysm ([place])    Dr. Oneida Alar follows lLOV 2 ''17 per pt "around 2 cm"  . Anemia    as a child  . Arthritis    left ankle, right knee, right SI joint, wrists, lower back  . COPD (chronic obstructive pulmonary disease) ([place])    ephysema-Dr. Chase Caller  . Dyspnea   . Headache    as a child would have terrible headaches during season changes  . 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 Hyperlipidemia   . Hypertension   . Multiple thyroid nodules   . Murmur, cardiac 1950  . Pneumonia   . Pre-diabetes   . Requires continuous at home supplemental oxygen    2 L 24/7  . Sebaceous cyst    hairline sebaceous cyst left posterior neck to be excised 04-13-16 by Dr. Harlow Asa in [place] [facility]  . Sleep apnea    cpap used sometimes, uses oxygen concentrator 2 l/m nasally bedtime  . Varicella as child    Past Surgical History:  Procedure Laterality Date  . APPENDECTOMY     2008  . [place]  . COLONOSCOPY    . CYST REMOVAL NECK Left 04/13/2016   Procedure: EXCISION OF SEBACEOUS CYST LEFT POSTERIOR NECK;  Surgeon: Armandina Gemma, MD;  Location: [place];  Service: General;  Laterality: Left;  . Excision of Pelvic Absess, Right Ovary     2008  . TUBAL LIGATION  1980    Family History  Problem Relation Age of Onset  . Heart disease Mother     MI - fatal  . Hypertension Mother   . Stroke Father 66    fatal  . Alzheimer's disease Father   . Alzheimer's disease Brother    . Hyperlipidemia Brother   . Hypertension Brother   . Diabetes Brother   . Hypertension Brother   . Hyperlipidemia Brother     Social History:  reports that she quit smoking about 5 years ago. Her smoking use included Cigarettes. She has a 50.00 pack-year smoking history. She has never used smokeless tobacco. She reports that she drinks alcohol. She reports that she does not use drugs.  Allergies:  Allergies  Allergen Reactions  . Nickel Rash    Severe rash to infection: pt is allergic to all metals other than sterling silver or gold jewelry.   [name] [Olmesartan] Swelling    Swelling of face and arms   . [name] [Valsartan] Swelling    Swelling of face and arms   . Lisinopril Cough  . Codeine Other (See Comments)    jittery  . Monosodium Glutamate Other (See Comments)    Facial swelling per pt  . Lead Acetate Rash    Medications: Scheduled: Continuous:  No results found for this or any previous visit (from the past 24 hour(s)).   No results found.  ROS:  As stated above in the HPI otherwise negative.  There were no vitals taken for this visit.  PE: Gen: NAD, Alert and Oriented HEENT:  [place]/AT, EOMI Neck: Supple, no LAD Lungs: CTA Bilaterally CV: RRR without M/G/R ABM: Soft, NTND, +BS Ext: No C/C/E  Assessment/Plan: 1) Personal history of polyps - Repeat colonoscopy. 2) COPD and OSA.  [name] D 04/15/2016, 9:10 AM

## 2016-04-15 NOTE — Patient Instructions (Addendum)
ICD-9-CM ICD-10-CM   1. COPD, severe ([place]) 63 J44.9    Copd stable but you are in stage 3 or severe category - you declined over summer 2017 Shortness of breath is due to copd, weight and diastolic dysfunction  Plan cotninue duononeb and arnuity and o2 at night/exertion Talk to PCP Scarlette Calico, MD and see if you can come off coreg that might negatively impact copd  followup 6 months or [place] if needed

## 2016-04-15 NOTE — Telephone Encounter (Signed)
Patient was seen by pulmonary today---per lung dtr request, dr Ronnald Ramp has given verbal order for patient to cut coreg med dosage in half----patient is scheduling appt for re-evaluation at end of january

## 2016-04-16 ENCOUNTER — Ambulatory Visit (HOSPITAL_COMMUNITY)
Admission: RE | Admit: 2016-04-16 | Discharge: 2016-04-16 | Disposition: A | Discharge status: Home / Self Care | Payer: PPO | Source: Ambulatory Visit | Attending: Gastroenterology | Admitting: Gastroenterology

## 2016-04-16 ENCOUNTER — Ambulatory Visit (HOSPITAL_COMMUNITY): Payer: PPO | Admitting: Anesthesiology

## 2016-04-16 ENCOUNTER — Encounter (HOSPITAL_COMMUNITY): Payer: Self-pay | Admitting: *Deleted

## 2016-04-16 ENCOUNTER — Ambulatory Visit (HOSPITAL_COMMUNITY): Admit: 2016-04-16 | Payer: PPO | Admitting: Gastroenterology

## 2016-04-16 ENCOUNTER — Encounter (HOSPITAL_COMMUNITY): Payer: Self-pay

## 2016-04-16 ENCOUNTER — Encounter (HOSPITAL_COMMUNITY)
Admission: RE | Disposition: A | Discharge status: Home / Self Care | Payer: Self-pay | Source: Ambulatory Visit | Attending: Gastroenterology

## 2016-04-16 DIAGNOSIS — K5730 Diverticulosis of large intestine without perforation or abscess without bleeding: Secondary | ICD-10-CM | POA: Insufficient documentation

## 2016-04-16 DIAGNOSIS — M1990 Unspecified osteoarthritis, unspecified site: Secondary | ICD-10-CM | POA: Insufficient documentation

## 2016-04-16 DIAGNOSIS — Z9981 Dependence on supplemental oxygen: Secondary | ICD-10-CM | POA: Diagnosis not present

## 2016-04-16 DIAGNOSIS — J449 Chronic obstructive pulmonary disease, unspecified: Secondary | ICD-10-CM | POA: Diagnosis not present

## 2016-04-16 DIAGNOSIS — Z86010 Personal history of colonic polyps: Secondary | ICD-10-CM | POA: Insufficient documentation

## 2016-04-16 DIAGNOSIS — Z1211 Encounter for screening for malignant neoplasm of colon: Secondary | ICD-10-CM | POA: Insufficient documentation

## 2016-04-16 DIAGNOSIS — Z87891 Personal history of nicotine dependence: Secondary | ICD-10-CM | POA: Insufficient documentation

## 2016-04-16 DIAGNOSIS — I10 Essential (primary) hypertension: Secondary | ICD-10-CM | POA: Insufficient documentation

## 2016-04-16 DIAGNOSIS — R7303 Prediabetes: Secondary | ICD-10-CM | POA: Insufficient documentation

## 2016-04-16 DIAGNOSIS — G4733 Obstructive sleep apnea (adult) (pediatric): Secondary | ICD-10-CM | POA: Insufficient documentation

## 2016-04-16 DIAGNOSIS — E785 Hyperlipidemia, unspecified: Secondary | ICD-10-CM | POA: Diagnosis not present

## 2016-04-16 DIAGNOSIS — D649 Anemia, unspecified: Secondary | ICD-10-CM | POA: Insufficient documentation

## 2016-04-16 DIAGNOSIS — I2510 Atherosclerotic heart disease of native coronary artery without angina pectoris: Secondary | ICD-10-CM | POA: Diagnosis not present

## 2016-04-16 HISTORY — DX: Sleep apnea, unspecified: G47.30

## 2016-04-16 HISTORY — DX: Abdominal aortic aneurysm, without rupture, unspecified: I71.40

## 2016-04-16 HISTORY — PX: COLONOSCOPY WITH PROPOFOL: SHX5780

## 2016-04-16 HISTORY — DX: Sebaceous cyst: L72.3

## 2016-04-16 HISTORY — DX: Abdominal aortic aneurysm, without rupture: I71.4

## 2016-04-16 SURGERY — COLONOSCOPY WITH PROPOFOL
Anesthesia: Monitor Anesthesia Care

## 2016-04-16 MED ORDER — PROPOFOL 500 MG/50ML IV EMUL
INTRAVENOUS | Status: DC | PRN
  Administered 2016-04-16: 100 ug/kg/min via INTRAVENOUS

## 2016-04-16 MED ORDER — MEPERIDINE HCL 100 MG/ML IJ SOLN: 6.2500 mg | INTRAMUSCULAR | Status: DC | PRN

## 2016-04-16 MED ORDER — SODIUM CHLORIDE 0.9 % IV SOLN: INTRAVENOUS | Status: DC

## 2016-04-16 MED ORDER — LACTATED RINGERS IV SOLN
INTRAVENOUS | Status: DC
  Administered 2016-04-16: 1000 mL via INTRAVENOUS

## 2016-04-16 MED ORDER — MIDAZOLAM HCL 5 MG/ML IJ SOLN: 0.5000 mg | Freq: Once | INTRAMUSCULAR | Status: DC | PRN

## 2016-04-16 MED ORDER — PROPOFOL 500 MG/50ML IV EMUL
INTRAVENOUS | Status: DC | PRN
  Administered 2016-04-16: 20 mg via INTRAVENOUS
  Administered 2016-04-16: 40 mg via INTRAVENOUS

## 2016-04-16 MED ORDER — PROPOFOL 10 MG/ML IV BOLUS
INTRAVENOUS | Status: AC
  Filled 2016-04-16: qty 40

## 2016-04-16 MED ORDER — PROMETHAZINE HCL 25 MG/ML IJ SOLN: 6.2500 mg | INTRAMUSCULAR | Status: DC | PRN

## 2016-04-16 SURGICAL SUPPLY — 21 items

## 2016-04-16 NOTE — Op Note (Signed)
[facility] Patient Name: Victoria [name] Procedure Date: 04/16/2016 MRN: [id number] Attending MD: Carol Ada , MD Date of Birth: December 24, 1945 CSN: [id number] Age: 70 Admit Type: Outpatient Procedure:                Colonoscopy Indications:              High risk colon cancer surveillance: Personal                            history of colonic polyps Providers:                Carol Ada, MD, Laverta Baltimore RN, RN, Cletis Athens, Technician Referring MD:              Medicines:                Propofol per Anesthesia Complications:            No immediate complications. Estimated Blood Loss:     Estimated blood loss: none. Procedure:                Pre-Anesthesia Assessment:                           - Prior to the procedure, a History and Physical                            was performed, and patient medications and                            allergies were reviewed. The patient's tolerance of                            previous anesthesia was also reviewed. The risks                            and benefits of the procedure and the sedation                            options and risks were discussed with the patient.                            All questions were answered, and informed consent                            was obtained. Prior Anticoagulants: The patient has                            taken no previous anticoagulant or antiplatelet                            agents. ASA Grade Assessment: III - A patient with                            severe  systemic disease. After reviewing the risks                            and benefits, the patient was deemed in                            satisfactory condition to undergo the procedure.                           - Sedation was administered by an anesthesia                            professional. Deep sedation was attained.                           After obtaining informed consent, the  colonoscope                            was passed under direct vision. Throughout the                            procedure, the patient's blood pressure, pulse, and                            oxygen saturations were monitored continuously. The                            Colonoscope was introduced through the anus and                            advanced to the the cecum, identified by                            appendiceal orifice and ileocecal valve. The                            colonoscopy was somewhat difficult due to poor                            endoscopic visualization and restricted mobility of                            the colon. Successful completion of the procedure                            was aided by applying abdominal pressure and                            lavage. The patient tolerated the procedure well.                            The quality of the bowel preparation was good. The  ileocecal valve, appendiceal orifice, and rectum                            were photographed. Scope In: 10:03:57 AM Scope Out: 10:36:12 AM Scope Withdrawal Time: 0 hours 22 minutes 0 seconds  Total Procedure Duration: 0 hours 32 minutes 15 seconds  Findings:      Scattered small and large-mouthed diverticula were found in the sigmoid       colon.      In the sigmoid colon there was some restricted mobility. It may have       been from her prior abdominal surgical intervention. Abdominal pressure       and gentle advancement of the pediatric colonoscope allowed for passage       through the angulation. the colon was poorly prepped, but with extensive       washings good to excellent views were obtained. Impression:               - Diverticulosis in the sigmoid colon.                           - No specimens collected. Moderate Sedation:      N/A- Per Anesthesia Care Recommendation:           - Patient has a contact number available for                             emergencies. The signs and symptoms of potential                            delayed complications were discussed with the                            patient. Return to normal activities tomorrow.                            Written discharge instructions were provided to the                            patient.                           - Resume previous diet.                           - Continue present medications.                           - Repeat colonoscopy in 5 years for surveillance. Procedure Code(s):        --- Professional ---                           [id number], Colorectal cancer screening; colonoscopy on                            individual at high risk Diagnosis Code(s):        --- Professional ---  Z86.010, Personal history of colonic polyps                           K57.30, Diverticulosis of large intestine without                            perforation or abscess without bleeding CPT copyright 2016 American [employer]. All rights reserved. The codes documented in this report are preliminary and upon coder review may  be revised to meet current compliance requirements. Carol Ada, MD Carol Ada, MD 04/16/2016 10:43:42 AM This report has been signed electronically. Number of Addenda: 0

## 2016-04-16 NOTE — Transfer of Care (Signed)
Immediate Anesthesia Transfer of Care Note  Patient: Victoria [name]  Procedure(s) Performed: Procedure(s): COLONOSCOPY WITH PROPOFOL (N/A)  Patient Location: PACU  Anesthesia Type:MAC  Level of Consciousness: awake, alert  and oriented  Airway & Oxygen Therapy: Patient Spontanous Breathing and Patient connected to face mask oxygen  Post-op Assessment: Report given to RN and Post -op Vital signs reviewed and stable  Post vital signs: Reviewed and stable  Last Vitals:  Vitals:   04/16/16 0903  BP: (!) 154/60  Pulse: 69  Resp: 11  Temp: 36.7 C    Last Pain:  Vitals:   04/16/16 0953  TempSrc:   PainSc: 0-No pain         Complications: No apparent anesthesia complications

## 2016-04-16 NOTE — Discharge Instructions (Signed)

## 2016-04-16 NOTE — Anesthesia Preprocedure Evaluation (Addendum)
Anesthesia Evaluation  Patient identified by MRN, date of birth, ID band Patient awake    Reviewed: Allergy & Precautions, NPO status , [name] Chart, lab work & pertinent test results, reviewed documented beta blocker date and time   History of Anesthesia Complications Negative for: history of anesthetic complications  Airway Mallampati: II  TM Distance: >3 FB Neck ROM: Full    Dental  (+) Missing, Dental Advisory Given   Pulmonary shortness of breath, sleep apnea, Continuous Positive Airway Pressure Ventilation and Oxygen sleep apnea , COPD,  COPD inhaler and oxygen dependent, former smoker,    breath sounds clear to auscultation       Cardiovascular hypertension, Pt. on medications and Pt. on home beta blockers + CAD (mild by CT), + Peripheral Vascular Disease and + DOE   Rhythm:Regular Rate:Normal  7/17 Stress: EF: 75%. No wall motion abnormalities, no ST segment deviation noted during stress, low risk study. No ischemia identified.   Neuro/Psych    GI/Hepatic negative GI ROS, Neg liver ROS,   Endo/Other  Morbid obesity  Renal/GU negative Renal ROS     Musculoskeletal  (+) Arthritis ,   Abdominal (+) + obese,   Peds  Hematology negative hematology ROS (+)   Anesthesia Other Findings   Reproductive/Obstetrics                            Anesthesia Physical Anesthesia Plan  ASA: III  Anesthesia Plan: MAC   Post-op Pain Management:    Induction:   Airway Management Planned: Natural Airway and Simple Face Mask  Additional Equipment:   Intra-op Plan:   Post-operative Plan:   Informed Consent: I have reviewed the patients History and Physical, chart, labs and discussed the procedure including the risks, benefits and alternatives for the proposed anesthesia with the patient or authorized representative who has indicated his/her understanding and acceptance.   Dental advisory  given  Plan Discussed with: CRNA and Surgeon  Anesthesia Plan Comments: (Plan routine monitors, MAC)        Anesthesia Quick Evaluation

## 2016-04-16 NOTE — Anesthesia Postprocedure Evaluation (Signed)
Anesthesia Post Note  Patient: Victoria [name]  Procedure(s) Performed: Procedure(s) (LRB): COLONOSCOPY WITH PROPOFOL (N/A)  Patient location during evaluation: Endoscopy Anesthesia Type: MAC Level of consciousness: awake and alert, oriented and patient cooperative Pain management: pain level controlled Vital Signs Assessment: post-procedure vital signs reviewed and stable Respiratory status: nonlabored ventilation, respiratory function stable and spontaneous breathing Cardiovascular status: blood pressure returned to baseline and stable Postop Assessment: no signs of nausea or vomiting Anesthetic complications: no    Last Vitals:  Vitals:   04/16/16 1043 04/16/16 1100  BP: (!) 157/74 (!) 157/75  Pulse: 77   Resp: 19   Temp: 36.3 C     Last Pain:  Vitals:   04/16/16 1110  TempSrc:   PainSc: 0-No pain                 Claretha Townshend,E. Tanna Loeffler

## 2016-04-19 ENCOUNTER — Encounter (HOSPITAL_COMMUNITY): Payer: Self-pay | Admitting: Gastroenterology

## 2016-04-20 ENCOUNTER — Encounter (HOSPITAL_COMMUNITY)
Admission: RE | Admit: 2016-04-20 | Discharge: 2016-04-20 | Disposition: A | Discharge status: Home / Self Care | Payer: PPO | Source: Ambulatory Visit | Attending: Internal Medicine | Admitting: Internal Medicine

## 2016-04-20 VITALS — Wt 221.3 lb

## 2016-04-20 DIAGNOSIS — J449 Chronic obstructive pulmonary disease, unspecified: Secondary | ICD-10-CM | POA: Insufficient documentation

## 2016-04-20 DIAGNOSIS — Z87891 Personal history of nicotine dependence: Secondary | ICD-10-CM | POA: Diagnosis not present

## 2016-04-20 DIAGNOSIS — J438 Other emphysema: Secondary | ICD-10-CM

## 2016-04-20 NOTE — Progress Notes (Signed)
Daily Session Note  Patient Details  Name: Victoria [name] MRN: [id number] Date of Birth: 31-Aug-1945 Referring Provider:   April Manson Pulmonary Rehab Walk Test from 03/04/2016 in [place]  Referring Provider  Dr. Chase Caller      Encounter Date: 04/20/2016  Check In:     Session Check In - 04/20/16 1357      Check-In   Location [facility]   Staff Present Rosebud Poles, RN, Luisa Hart, RN, BSN;Molly diVincenzo, MS, ACSM RCEP, Exercise Physiologist;Lisa Ysidro Evert, RN   Supervising physician immediately available to respond to emergencies [facility] immediately available   Physician(s) Dr. Ree Kida   Medication changes reported     No   Fall or balance concerns reported    No   Warm-up and Cool-down Performed as group-led instruction   Resistance Training Performed Yes   VAD Patient? No     Pain Assessment   Currently in Pain? No/denies   Multiple Pain Sites No      Capillary Blood Glucose: No results found for this or any previous visit (from the past 24 hour(s)).      Exercise Prescription Changes - 04/20/16 1500      Response to Exercise   Blood Pressure (Admit) 142/64   Blood Pressure (Exercise) 160/80   Blood Pressure (Exit) 124/60   Heart Rate (Admit) 74 bpm   Heart Rate (Exercise) 92 bpm   Heart Rate (Exit) 81 bpm   Oxygen Saturation (Admit) 98 %   Oxygen Saturation (Exercise) 95 %   Oxygen Saturation (Exit) 94 %   Rating of Perceived Exertion (Exercise) 15   Perceived Dyspnea (Exercise) 2   Duration Progress to 45 minutes of aerobic exercise without signs/symptoms of physical distress   Intensity THRR unchanged     Progression   Progression Continue to progress workloads to maintain intensity without signs/symptoms of physical distress.     Resistance Training   Training Prescription Yes   Weight green bands   Reps 10-12  10 minutes of strength training     Interval Training   Interval  Training No     Oxygen   Oxygen Continuous     NuStep   Level 2   Minutes 17   METs 2     Track   Laps 15   Minutes 34     Goals Met:  Exercise tolerated well Strength training completed today  Goals Unmet:  Not Applicable  Comments: Service time is from 1330 to 1515    Dr. Rush Farmer is Medical Director for Pulmonary Rehab at [facility].

## 2016-04-20 NOTE — Progress Notes (Signed)
Pulmonary Individual Treatment Plan  Patient Details  Name: Victoria [name] MRN: [id number] Date of Birth: January 11, 1946 Referring Provider:   April Manson Pulmonary Rehab Walk Test from 03/04/2016 in [place]  Referring Provider  Dr. Chase Caller      Initial Encounter Date:  Flowsheet Row Pulmonary Rehab Walk Test from 03/04/2016 in [place]  Date  03/04/16  Referring Provider  Dr. Chase Caller      Visit Diagnosis: Chronic obstructive pulmonary disease, unspecified COPD type ([place])  Other emphysema ([place])  [name] Home Medications on Admission:   Current Outpatient Prescriptions:  .  albuterol (VENTOLIN HFA) 108 (90 Base) MCG/ACT inhaler, Inhale 1 puff into the lungs every 6 (six) hours as needed for wheezing., Disp: 18 each, Rfl: 5 .  amLODipine (NORVASC) 10 MG tablet, Take 1 tablet (10 mg total) by mouth daily., Disp: 90 tablet, Rfl: 1 .  aspirin-acetaminophen-caffeine (EXCEDRIN MIGRAINE) 250-250-65 MG tablet, Take 2 tablets by mouth daily as needed for headache., Disp: , Rfl:  .  Aspirin-Calcium Carbonate (BAYER WOMENS) 81-300 MG TABS, Take 1 tablet by mouth daily with breakfast., Disp: , Rfl:  .  atorvastatin (LIPITOR) 40 MG tablet, Take 40 mg by mouth daily. Takes daily AM, Disp: , Rfl:  .  carvedilol (COREG) 3.125 MG tablet, TAKE ONE TABLET BY MOUTH TWICE DAILY WITH MEALS, Disp: 90 tablet, Rfl: 1 .  cholecalciferol (VITAMIN D) 1000 units tablet, Take 2,000 Units by mouth daily., Disp: , Rfl:  .  Fluticasone Furoate (ARNUITY ELLIPTA) 100 MCG/ACT AEPB, Inhale 1 puff into the lungs daily., Disp: 30 each, Rfl: 2 .  hydrochlorothiazide (HYDRODIURIL) 25 MG tablet, Take 1 tablet (25 mg total) by mouth daily., Disp: 90 tablet, Rfl: 1 .  HYDROcodone-acetaminophen (NORCO/VICODIN) 5-325 MG tablet, Take 1-2 tablets by mouth every 4 (four) hours as needed for moderate pain., Disp: 10 tablet, Rfl: 0 .  ipratropium-albuterol  (DUONEB) 0.5-2.5 (3) MG/3ML SOLN, USE ONE VIAL IN NEBULIZER 4 TIMES DAILY, Disp: 360 mL, Rfl: 5 .  OXYGEN, Inhale 2 L into the lungs. When exerting self, Disp: , Rfl:  .  sodium chloride (OCEAN) 0.65 % SOLN nasal spray, Place 1 spray into both nostrils as needed (dryness)., Disp: , Rfl:   Past Medical History: Past Medical History:  Diagnosis Date  . Abdominal aneurysm ([place])    Dr. Oneida Alar follows lLOV 2 ''17 per pt "around 2 cm"  . Anemia    as a child  . Arthritis    left ankle, right knee, right SI joint, wrists, lower back  . COPD (chronic obstructive pulmonary disease) ([place])    ephysema-Dr. Chase Caller  . Dyspnea   . Headache    as a child would have terrible headaches during season changes  . 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 Hyperlipidemia   . Hypertension   . Multiple thyroid nodules   . Murmur, cardiac 1950  . Pneumonia   . Pre-diabetes   . Requires continuous at home supplemental oxygen    2 L 24/7  . Sebaceous cyst    hairline sebaceous cyst left posterior neck to be excised 04-13-16 by Dr. Harlow Asa in [facility]  . Sleep apnea    cpap used sometimes, uses oxygen concentrator 2 l/m nasally bedtime  . Varicella as child    Tobacco Use: History  Smoking Status  . Former Smoker  . Packs/day: 1.00  . Years: 50.00  . Types: Cigarettes  .  Quit date: 04/16/2011  Smokeless Tobacco  . Never Used    Comment: quit that date when she had to go to ER     Labs: Recent Review Flowsheet Data    Labs for ITP Cardiac and Pulmonary Rehab Latest Ref Rng & Units 07/06/2012 02/11/2014 09/05/2014 10/14/2015 01/08/2016   Cholestrol 0 - 200 mg/dL 183 193 199 201(H) -   LDLCALC 0 - 99 mg/dL 107(H) - - 123(H) -   LDLDIRECT mg/dL - 103.7 121.0 - -   HDL >39.00 mg/dL 57.50 48.10 49.50 61.30 -   Trlycerides 0.0 - 149.0 mg/dL 92.0 429.0(H) 210.0(H) 83.0 -   Hemoglobin A1c 4.6 - 6.5 % - - - - 6.5      Capillary Blood Glucose: Lab Results  Component Value Date   GLUCAP  114 (H) 04/13/2016     ADL [facility]:     Pulmonary Assessment Scores    Row Name 02/24/16 1559 02/24/16 1600       ADL [facility]   ADL Phase Entry  -    SOB Score total 52  -      CAT Score   CAT Score  - 13       Pulmonary Function Assessment:     Pulmonary Function Assessment - 02/23/16 0923      Breath   Bilateral Breath Sounds Decreased   Shortness of Breath Limiting activity;Yes      Exercise Target Goals:    Exercise Program Goal: Individual exercise prescription set with THRR, safety & activity barriers. Participant demonstrates ability to understand and report RPE using BORG scale, to self-measure pulse accurately, and to acknowledge the importance of the exercise prescription.  Exercise Prescription Goal: Starting with aerobic activity 30 plus minutes a day, 3 days per week for initial exercise prescription. Provide home exercise prescription and guidelines that participant acknowledges understanding prior to discharge.  Activity Barriers & Risk Stratification:     Activity Barriers & Cardiac Risk Stratification - 02/23/16 0918      Activity Barriers & Cardiac Risk Stratification   Activity Barriers Arthritis;Back Problems;Shortness of Breath;Deconditioning      6 Minute Walk:     6 Minute Walk    Row Name 03/04/16 1638         6 Minute Walk   Phase Initial     Distance 1100 feet     Walk Time -  5 minutes and 20 seconds     # of Rest Breaks 1  40 seconds     MPH 2.08     METS 2.61     RPE 13     Perceived Dyspnea  2     Symptoms No     Resting HR 83 bpm     Resting BP 140/80     Max Ex. HR 100 bpm     Max Ex. BP 150/70       Interval HR   Baseline HR 83     1 Minute HR 71     2 Minute HR 71     3 Minute HR 100     4 Minute HR 94     5 Minute HR 92     6 Minute HR 92     2 Minute Post HR 88     Interval Heart Rate? Yes       Interval Oxygen   Interval Oxygen? Yes     Baseline Oxygen Saturation % 93 %     Baseline Liters  of  Oxygen 2 L     1 Minute Oxygen Saturation % 92 %     1 Minute Liters of Oxygen 2 L     2 Minute Oxygen Saturation % 89 %     2 Minute Liters of Oxygen 2 L     3 Minute Oxygen Saturation % 87 %     3 Minute Liters of Oxygen 2 L     4 Minute Oxygen Saturation % 90 %     4 Minute Liters of Oxygen 3 L     5 Minute Oxygen Saturation % 94 %     5 Minute Liters of Oxygen 3 L     6 Minute Oxygen Saturation % 94 %     6 Minute Liters of Oxygen 3 L     2 Minute Post Oxygen Saturation % 97 %     2 Minute Post Liters of Oxygen 3 L        Initial Exercise Prescription:     Initial Exercise Prescription - 03/04/16 1600      Date of Initial Exercise RX and Referring Provider   Date 03/04/16   Referring Provider Dr. Chase Caller     Oxygen   Oxygen Continuous   Liters 3     NuStep   Level 2   Minutes 17     Track   Laps 5   Minutes 17     Prescription Details   Frequency (times per week) 2   Duration Progress to 45 minutes of aerobic exercise without signs/symptoms of physical distress     Intensity   THRR 40-80% of Max Heartrate 59-118   Ratings of Perceived Exertion 11-13   Perceived Dyspnea 0-4     Progression   Progression Continue progressive overload as per policy without signs/symptoms or physical distress.     Resistance Training   Training Prescription Yes   Weight Green bands   Reps 10-12      Perform Capillary Blood Glucose checks as needed.  Exercise Prescription Changes:     Exercise Prescription Changes    Row Name 03/11/16 1600 03/16/16 1500 03/23/16 1618 03/25/16 1600       Response to Exercise   Blood Pressure (Admit) 150/70 130/50 140/60 140/72    Blood Pressure (Exercise) 144/80 150/64 148/78 160/80    Blood Pressure (Exit) 150/64 130/66 132/60 124/60    Heart Rate (Admit) 88 bpm 78 bpm 80 bpm 80 bpm    Heart Rate (Exercise) 91 bpm 97 bpm 90 bpm 85 bpm    Heart Rate (Exit) 77 bpm 72 bpm 88 bpm 77 bpm    Oxygen Saturation (Admit) 88 % 90 % 90 %  92 %    Oxygen Saturation (Exercise) 95 % 94 % 93 % 94 %    Oxygen Saturation (Exit) 99 % 97 % 97 % 96 %    Rating of Perceived Exertion (Exercise) 15 13 14 13     Perceived Dyspnea (Exercise) 3 3 3 1     Duration Progress to 45 minutes of aerobic exercise without signs/symptoms of physical distress Progress to 45 minutes of aerobic exercise without signs/symptoms of physical distress Progress to 45 minutes of aerobic exercise without signs/symptoms of physical distress Progress to 45 minutes of aerobic exercise without signs/symptoms of physical distress    Intensity -  40-80% intensity of THRR THRR unchanged THRR unchanged THRR unchanged      Progression   Progression Continue to progress workloads to maintain intensity  without signs/symptoms of physical distress. Continue to progress workloads to maintain intensity without signs/symptoms of physical distress. Continue to progress workloads to maintain intensity without signs/symptoms of physical distress. Continue to progress workloads to maintain intensity without signs/symptoms of physical distress.      Resistance Training   Training Prescription Yes Yes Yes Yes    Weight green bands green bands green bands green bands    Reps 10-12  10 minutes of strength training 10-12  10 minutes of strength training 10-12  10 minutes of strength training 10-12  10 minutes of strength training      Interval Training   Interval Training No No No No      Oxygen   Oxygen Intermittent Continuous Continuous Continuous    Liters 3 3 3 3       NuStep   Level 2 2 2 2     Minutes 17 17 17 17     METs 1.4 1.8 1.9 1.9      Track   Laps 7 16 13 6     Minutes 17 34 34 17       Exercise Comments:     Exercise Comments    Row Name 03/22/16 1020 04/19/16 1704         Exercise Comments Patient has only attended two sessions. Will cont. to monitor. Patient has only attended 4 sessions. Attendance is extremely poor. Will discuss when patient returns to  rehab.          Discharge Exercise Prescription (Final Exercise Prescription Changes):     Exercise Prescription Changes - 03/25/16 1600      Response to Exercise   Blood Pressure (Admit) 140/72   Blood Pressure (Exercise) 160/80   Blood Pressure (Exit) 124/60   Heart Rate (Admit) 80 bpm   Heart Rate (Exercise) 85 bpm   Heart Rate (Exit) 77 bpm   Oxygen Saturation (Admit) 92 %   Oxygen Saturation (Exercise) 94 %   Oxygen Saturation (Exit) 96 %   Rating of Perceived Exertion (Exercise) 13   Perceived Dyspnea (Exercise) 1   Duration Progress to 45 minutes of aerobic exercise without signs/symptoms of physical distress   Intensity THRR unchanged     Progression   Progression Continue to progress workloads to maintain intensity without signs/symptoms of physical distress.     Resistance Training   Training Prescription Yes   Weight green bands   Reps 10-12  10 minutes of strength training     Interval Training   Interval Training No     Oxygen   Oxygen Continuous   Liters 3     NuStep   Level 2   Minutes 17   METs 1.9     Track   Laps 6   Minutes 17       Nutrition:  Target Goals: Understanding of nutrition guidelines, daily intake of sodium 1500mg , cholesterol 200mg , calories 30% from fat and 7% or less from saturated fats, daily to have 5 or more servings of fruits and vegetables.  Biometrics:     Pre Biometrics - 02/23/16 0929      Pre Biometrics   Grip Strength 27 kg       Nutrition Therapy Plan and Nutrition Goals:   Nutrition Discharge: Rate Your Plate Scores:   Psychosocial: Target Goals: Acknowledge presence or absence of depression, maximize coping skills, provide positive support system. Participant is able to verbalize types and ability to use techniques and skills needed for reducing stress and depression.  Initial Review & Psychosocial Screening:     Initial Psych Review & Screening - 02/23/16 [place]? Yes     Barriers   Psychosocial barriers to participate in program There are no identifiable barriers or psychosocial needs.     Screening Interventions   Interventions Encouraged to exercise      Quality of Life Scores:     Quality of Life - 02/24/16 1600      Quality of Life Scores   Health/Function Pre 21.57 %   Socioeconomic Pre 20.39 %   Psych/Spiritual Pre 24.3 %   Family Pre 30 %   GLOBAL Pre 22.48 %      PHQ-9: Recent Review Flowsheet Data    Depression screen [facility] 2/9 02/23/2016 04/07/2015 01/28/2015 10/29/2014 10/07/2014   Decreased Interest 1 1 - 0 0   Down, Depressed, Hopeless 0 1 0 0 0   PHQ - 2 Score 1 2 0 0 0   Altered sleeping - 1 - - -   Tired, decreased energy - 1 - - -   Change in appetite - 1 - - -   Feeling bad or failure about yourself  - 0 - - -   Trouble concentrating - 0 - - -   Moving slowly or fidgety/restless - 0 - - -   Suicidal thoughts - 0 - - -   PHQ-9 Score - 5 - - -      Psychosocial Evaluation and Intervention:     Psychosocial Evaluation - 02/23/16 0925      Psychosocial Evaluation & Interventions   Interventions Encouraged to exercise with the program and follow exercise prescription      Psychosocial Re-Evaluation:     Psychosocial Re-Evaluation    Row Name 03/16/16 1147 03/23/16 0647 04/13/16 1003         Psychosocial Re-Evaluation   Interventions Encouraged to attend Pulmonary Rehabilitation for the exercise - Encouraged to attend Pulmonary Rehabilitation for the exercise     Comments No psychosocial issues identified at this time - No psychosocial issues identified at this time.     Continued Psychosocial Services Needed No  - No       Education: Education Goals: Education classes will be provided on a weekly basis, covering required topics. Participant will state understanding/return demonstration of topics presented.  Learning Barriers/Preferences:     Learning Barriers/Preferences - 02/23/16  0919      Learning Barriers/Preferences   Learning Barriers None   Learning Preferences Group Instruction;Computer/Internet;Individual Instruction;Verbal Instruction;Written Material      Education Topics: Risk Factor Reduction:  -Group instruction that is supported by a PowerPoint presentation. Instructor discusses the definition of a risk factor, different risk factors for pulmonary disease, and how the heart and lungs work together.     Nutrition for Pulmonary Patient:  -Group instruction provided by PowerPoint slides, verbal discussion, and written materials to support subject matter. The instructor gives an explanation and review of healthy diet recommendations, which includes a discussion on weight management, recommendations for fruit and vegetable consumption, as well as protein, fluid, caffeine, fiber, sodium, sugar, and alcohol. Tips for eating when patients are short of breath are discussed. Flowsheet Row PULMONARY REHAB CHRONIC OBSTRUCTIVE PULMONARY DISEASE from 03/25/2016 in [place]  Date  03/25/16 [facility] Eating During the [place]  Educator  RD  Instruction Review Code  2- meets goals/outcomes      Pursed  Lip Breathing:  -Group instruction that is supported by demonstration and informational handouts. Instructor discusses the benefits of pursed lip and diaphragmatic breathing and detailed demonstration on how to preform both.     Oxygen Safety:  -Group instruction provided by PowerPoint, verbal discussion, and written material to support subject matter. There is an overview of "What is Oxygen" and "Why do we need it".  Instructor also reviews how to create a safe environment for oxygen use, the importance of using oxygen as prescribed, and the risks of noncompliance. There is a brief discussion on traveling with oxygen and resources the patient may utilize.   Oxygen Equipment:  -Group instruction provided by [facility] Staff utilizing  handouts, written materials, and equipment demonstrations.   Signs and Symptoms:  -Group instruction provided by written material and verbal discussion to support subject matter. Warning signs and symptoms of infection, stroke, and heart attack are reviewed and when to call the physician/911 reinforced. Tips for preventing the spread of infection discussed.   Advanced Directives:  -Group instruction provided by verbal instruction and written material to support subject matter. Instructor reviews Advanced Directive laws and proper instruction for filling out document.   Pulmonary Video:  -Group video education that reviews the importance of medication and oxygen compliance, exercise, good nutrition, pulmonary hygiene, and pursed lip and diaphragmatic breathing for the pulmonary patient.   Exercise for the Pulmonary Patient:  -Group instruction that is supported by a PowerPoint presentation. Instructor discusses benefits of exercise, core components of exercise, frequency, duration, and intensity of an exercise routine, importance of utilizing pulse oximetry during exercise, safety while exercising, and options of places to exercise outside of rehab.     Pulmonary Medications:  -Verbally interactive group education provided by instructor with focus on inhaled medications and proper administration.   Anatomy and Physiology of the Respiratory System and Intimacy:  -Group instruction provided by PowerPoint, verbal discussion, and written material to support subject matter. Instructor reviews respiratory cycle and anatomical components of the respiratory system and their functions. Instructor also reviews differences in obstructive and restrictive respiratory diseases with examples of each. Intimacy, Sex, and Sexuality differences are reviewed with a discussion on how relationships can change when diagnosed with pulmonary disease. Common sexual concerns are reviewed.   Knowledge Questionnaire  Score:     Knowledge Questionnaire Score - 02/24/16 1559      Knowledge Questionnaire Score   Pre Score 9/13      Core Components/Risk Factors/Patient Goals at Admission:     Personal Goals and Risk Factors at Admission - 02/23/16 0923      Core Components/Risk Factors/Patient Goals on Admission    Weight Management Yes;Obesity   Intervention Obesity: Provide education and appropriate resources to help participant work on and attain dietary goals.;Weight Management/Obesity: Establish reasonable short term and long term weight goals.   Expected Outcomes Short Term: Continue to assess and modify interventions until short term weight is achieved;Long Term: Adherence to nutrition and physical activity/exercise program aimed toward attainment of established weight goal   Improve shortness of breath with ADL's Yes   Intervention Provide education, individualized exercise plan and daily activity instruction to help decrease symptoms of SOB with activities of daily living.   Expected Outcomes Short Term: Achieves a reduction of symptoms when performing activities of daily living.   Increase knowledge of respiratory medications and ability to use respiratory devices properly  Yes   Intervention Provide education and demonstration as needed of appropriate use of medications, inhalers,  and oxygen therapy.   Expected Outcomes Short Term: Achieves understanding of medications use. Understands that oxygen is a medication prescribed by physician. Demonstrates appropriate use of inhaler and oxygen therapy.   Hypertension Yes   Intervention Provide education on lifestyle modifcations including regular physical activity/exercise, weight management, moderate sodium restriction and increased consumption of fresh fruit, vegetables, and low fat dairy, alcohol moderation, and smoking cessation.;Monitor prescription use compliance.   Expected Outcomes Short Term: Continued assessment and intervention until BP is  < 140/19mm HG in hypertensive participants. < 130/4mm HG in hypertensive participants with diabetes, heart failure or chronic kidney disease.;Long Term: Maintenance of blood pressure at goal levels.      Core Components/Risk Factors/Patient Goals Review:      Goals and Risk Factor Review    Row Name 03/16/16 1145 03/23/16 0646 04/13/16 1000         Core Components/Risk Factors/Patient Goals Review   Personal Goals Review Weight Management/Obesity;Hypertension;Improve shortness of breath with ADL's;Develop more efficient breathing techniques such as purse lipped breathing and diaphragmatic breathing and practicing self-pacing with activity.;Increase Strength and Stamina - Weight Management/Obesity;Hypertension;Improve shortness of breath with ADL's;Develop more efficient breathing techniques such as purse lipped breathing and diaphragmatic breathing and practicing self-pacing with activity.;Increase Strength and Stamina     Review Has attended 1 exercise session, too early to meet any program goals - Very slow progress has only attended 4 exercise sessions since starting program, has been absent due to multiple reasons.     Expected Outcomes expect to see progress with goals in the next full 30 days. - expect improvement with regular attendance, encourage regular attendance        Core Components/Risk Factors/Patient Goals at Discharge (Final Review):      Goals and Risk Factor Review - 04/13/16 1000      Core Components/Risk Factors/Patient Goals Review   Personal Goals Review Weight Management/Obesity;Hypertension;Improve shortness of breath with ADL's;Develop more efficient breathing techniques such as purse lipped breathing and diaphragmatic breathing and practicing self-pacing with activity.;Increase Strength and Stamina   Review Very slow progress has only attended 4 exercise sessions since starting program, has been absent due to multiple reasons.   Expected Outcomes expect  improvement with regular attendance, encourage regular attendance      ITP Comments:   Comments: ITP REVIEW Pt is making expected progress toward pulmonary rehab goals after completing 4 sessions. Recommend continued exercise, life style modification, education, and utilization of breathing techniques to increase stamina and strength and decrease shortness of breath with exertion.

## 2016-04-21 ENCOUNTER — Telehealth: Payer: Self-pay | Admitting: Acute Care

## 2016-04-21 DIAGNOSIS — Z87891 Personal history of nicotine dependence: Secondary | ICD-10-CM

## 2016-04-21 NOTE — Telephone Encounter (Signed)
I called to ensure Victoria [name] had received the results of her LDCT done 10/09/2015, and to let her know we will be ordering and scheduling her annual screening in May 2018. She had received the results of her scan in May, and had no further questions upon completion of the call.She verbalized understanding of the above.

## 2016-04-22 ENCOUNTER — Encounter (HOSPITAL_COMMUNITY)
Admission: RE | Admit: 2016-04-22 | Discharge: 2016-04-22 | Disposition: A | Discharge status: Home / Self Care | Payer: PPO | Source: Ambulatory Visit | Attending: Internal Medicine | Admitting: Internal Medicine

## 2016-04-22 VITALS — Wt 219.8 lb

## 2016-04-22 DIAGNOSIS — J449 Chronic obstructive pulmonary disease, unspecified: Secondary | ICD-10-CM

## 2016-04-22 DIAGNOSIS — J438 Other emphysema: Secondary | ICD-10-CM

## 2016-04-22 NOTE — Progress Notes (Signed)
Daily Session Note  Patient Details  Name: Victoria [name] MRN: [id number] Date of Birth: January 08, 1946 Referring Provider:   April Manson Pulmonary Rehab Walk Test from 03/04/2016 in [place]  Referring Provider  Dr. Chase Caller      Encounter Date: 04/22/2016  Check In:     Session Check In - 04/22/16 1348      Check-In   Location [facility]   Staff Present Rosebud Poles, RN, BSN;Molly diVincenzo, MS, ACSM RCEP, Exercise Physiologist;Mandisa Persinger Ysidro Evert, RN;Portia Rollene Rotunda, RN, BSN   Supervising physician immediately available to respond to emergencies [facility] immediately available   Physician(s) Dr. Allyson Sabal   Medication changes reported     No   Fall or balance concerns reported    No   Warm-up and Cool-down Performed as group-led instruction   Resistance Training Performed Yes   VAD Patient? No     Pain Assessment   Currently in Pain? No/denies   Multiple Pain Sites No      Capillary Blood Glucose: No results found for this or any previous visit (from the past 24 hour(s)).      Exercise Prescription Changes - 04/22/16 1600      Exercise Review   Progression Yes     Response to Exercise   Blood Pressure (Admit) 156/60   Blood Pressure (Exercise) 160/88   Blood Pressure (Exit) 152/70   Heart Rate (Admit) 79 bpm   Heart Rate (Exercise) 98 bpm   Heart Rate (Exit) 79 bpm   Oxygen Saturation (Admit) 84 %  on room air with rest increased to 89   Oxygen Saturation (Exercise) 92 %   Oxygen Saturation (Exit) 97 %   Rating of Perceived Exertion (Exercise) 11   Perceived Dyspnea (Exercise) 1   Duration Progress to 45 minutes of aerobic exercise without signs/symptoms of physical distress   Intensity THRR unchanged     Progression   Progression Continue to progress workloads to maintain intensity without signs/symptoms of physical distress.     Resistance Training   Training Prescription Yes   Weight green bands   Reps  10-12  10 minutes of strength training     Interval Training   Interval Training No     Oxygen   Oxygen Continuous   Liters 3     NuStep   Level 3   Minutes 17   METs 2     Track   Laps 10   Minutes 17     Goals Met:  Exercise tolerated well No report of cardiac concerns or symptoms Strength training completed today  Goals Unmet:  Not Applicable  Comments: Service time is from 1330 to 1535    Dr. Rush Farmer is Medical Director for Pulmonary Rehab at [facility].

## 2016-04-27 ENCOUNTER — Telehealth (HOSPITAL_COMMUNITY): Payer: Self-pay | Admitting: Internal Medicine

## 2016-04-27 ENCOUNTER — Encounter (HOSPITAL_COMMUNITY): Payer: PPO

## 2016-04-29 ENCOUNTER — Encounter (HOSPITAL_COMMUNITY): Payer: PPO

## 2016-05-04 ENCOUNTER — Other Ambulatory Visit: Payer: Self-pay | Admitting: Surgery

## 2016-05-04 ENCOUNTER — Encounter (HOSPITAL_COMMUNITY)
Admission: RE | Admit: 2016-05-04 | Discharge: 2016-05-04 | Disposition: A | Discharge status: Home / Self Care | Payer: PPO | Source: Ambulatory Visit | Attending: Internal Medicine | Admitting: Internal Medicine

## 2016-05-04 ENCOUNTER — Telehealth: Payer: Self-pay | Admitting: Internal Medicine

## 2016-05-04 VITALS — Wt 221.1 lb

## 2016-05-04 DIAGNOSIS — J449 Chronic obstructive pulmonary disease, unspecified: Secondary | ICD-10-CM | POA: Diagnosis not present

## 2016-05-04 DIAGNOSIS — J438 Other emphysema: Secondary | ICD-10-CM

## 2016-05-04 DIAGNOSIS — L723 Sebaceous cyst: Secondary | ICD-10-CM | POA: Diagnosis not present

## 2016-05-04 DIAGNOSIS — E042 Nontoxic multinodular goiter: Secondary | ICD-10-CM

## 2016-05-04 NOTE — Progress Notes (Signed)
Daily Session Note  Patient Details  Name: Victoria [name] MRN: [id number] Date of Birth: 1945-11-09 Referring Provider:   April Manson Pulmonary Rehab Walk Test from 03/04/2016 in [place]  Referring Provider  Dr. Chase Caller      Encounter Date: 05/04/2016  Check In:     Session Check In - 05/04/16 1616      Check-In   Location [facility]   Staff Present Su Hilt, MS, ACSM RCEP, Exercise Physiologist;Annedrea Stackhouse, RN, MHA;Portia Rollene Rotunda, RN, BSN   Supervising physician immediately available to respond to emergencies [facility] immediately available   Physician(s) Dr. Carles Collet   Medication changes reported     No   Fall or balance concerns reported    No   Warm-up and Cool-down Performed as group-led instruction   Resistance Training Performed Yes   VAD Patient? No     Pain Assessment   Currently in Pain? No/denies   Multiple Pain Sites No      Capillary Blood Glucose: No results found for this or any previous visit (from the past 24 hour(s)).      Exercise Prescription Changes - 05/04/16 1600      Exercise Review   Progression Yes     Response to Exercise   Blood Pressure (Admit) 144/60   Blood Pressure (Exercise) 170/70   Blood Pressure (Exit) 156/66   Heart Rate (Admit) 89 bpm   Heart Rate (Exercise) 114 bpm   Heart Rate (Exit) 97 bpm   Oxygen Saturation (Admit) 92 %   Oxygen Saturation (Exercise) 93 %   Oxygen Saturation (Exit) 95 %   Rating of Perceived Exertion (Exercise) 13   Perceived Dyspnea (Exercise) 2   Duration Progress to 45 minutes of aerobic exercise without signs/symptoms of physical distress   Intensity THRR unchanged     Progression   Progression Continue to progress workloads to maintain intensity without signs/symptoms of physical distress.     Resistance Training   Training Prescription Yes   Weight green bands   Reps 10-12  10 minutes of strength training     Interval Training   Interval Training No     Oxygen   Oxygen Continuous   Liters 3     NuStep   Level 5   Minutes 17   METs 2     Track   Laps 15   Minutes 34     Goals Met:  Exercise tolerated well No report of cardiac concerns or symptoms Strength training completed today  Goals Unmet:  Not Applicable  Comments: Service time is from 1:30p to 3:00p    Dr. Rush Farmer is Medical Director for Pulmonary Rehab at [facility].

## 2016-05-04 NOTE — Telephone Encounter (Signed)
Called and spoke to pt. Pt is requesting a handicap placard. Advised pt that MR is not in office today but will be here to sign on 12.20.17. Pt verbalized understanding.

## 2016-05-06 ENCOUNTER — Encounter (HOSPITAL_COMMUNITY)
Admission: RE | Admit: 2016-05-06 | Discharge: 2016-05-06 | Disposition: A | Discharge status: Home / Self Care | Payer: PPO | Source: Ambulatory Visit | Attending: Internal Medicine | Admitting: Internal Medicine

## 2016-05-06 NOTE — Telephone Encounter (Signed)
This form has been completed. Called and spoke to pt. Informed her the placard has been completed and been ready for pick up. Pt verbalized understanding and denied any further questions or concerns at this time.

## 2016-05-06 NOTE — Telephone Encounter (Signed)
Victoria [name] did MR complete this yesterday? Thanks.

## 2016-05-11 ENCOUNTER — Encounter (HOSPITAL_COMMUNITY)
Admission: RE | Admit: 2016-05-11 | Discharge: 2016-05-11 | Disposition: A | Discharge status: Home / Self Care | Payer: PPO | Source: Ambulatory Visit | Attending: Internal Medicine | Admitting: Internal Medicine

## 2016-05-13 ENCOUNTER — Telehealth (HOSPITAL_COMMUNITY): Payer: Self-pay | Admitting: Internal Medicine

## 2016-05-13 ENCOUNTER — Encounter (HOSPITAL_COMMUNITY)
Admission: RE | Admit: 2016-05-13 | Discharge: 2016-05-13 | Disposition: A | Discharge status: Home / Self Care | Payer: PPO | Source: Ambulatory Visit | Attending: Internal Medicine | Admitting: Internal Medicine

## 2016-05-13 ENCOUNTER — Other Ambulatory Visit: Payer: Self-pay | Admitting: Internal Medicine

## 2016-05-13 DIAGNOSIS — J438 Other emphysema: Secondary | ICD-10-CM

## 2016-05-13 DIAGNOSIS — J449 Chronic obstructive pulmonary disease, unspecified: Secondary | ICD-10-CM

## 2016-05-13 DIAGNOSIS — I10 Essential (primary) hypertension: Secondary | ICD-10-CM

## 2016-05-13 NOTE — Progress Notes (Signed)
Pulmonary Individual Treatment Plan  Patient Details  Name: Victoria [name] MRN: [id number] Date of Birth: Jul 26, 1945 Referring Provider:   April Manson Pulmonary Rehab Walk Test from 03/04/2016 in [facility]  Referring Provider  Dr. Chase Caller      Initial Encounter Date:  Flowsheet Row Pulmonary Rehab Walk Test from 03/04/2016 in [facility]  Date  03/04/16  Referring Provider  Dr. Chase Caller      Visit Diagnosis: Chronic obstructive pulmonary disease, unspecified COPD type ([place])  Other emphysema ([place])  [name] Home Medications on Admission:   Current Outpatient Prescriptions:  .  albuterol (VENTOLIN HFA) 108 (90 Base) MCG/ACT inhaler, Inhale 1 puff into the lungs every 6 (six) hours as needed for wheezing., Disp: 18 each, Rfl: 5 .  amLODipine (NORVASC) 10 MG tablet, Take 1 tablet (10 mg total) by mouth daily., Disp: 90 tablet, Rfl: 1 .  aspirin-acetaminophen-caffeine (EXCEDRIN MIGRAINE) 250-250-65 MG tablet, Take 2 tablets by mouth daily as needed for headache., Disp: , Rfl:  .  Aspirin-Calcium Carbonate (BAYER WOMENS) 81-300 MG TABS, Take 1 tablet by mouth daily with breakfast., Disp: , Rfl:  .  atorvastatin (LIPITOR) 40 MG tablet, Take 40 mg by mouth daily. Takes daily AM, Disp: , Rfl:  .  carvedilol (COREG) 3.125 MG tablet, TAKE ONE TABLET BY MOUTH TWICE DAILY WITH MEALS, Disp: 90 tablet, Rfl: 1 .  cholecalciferol (VITAMIN D) 1000 units tablet, Take 2,000 Units by mouth daily., Disp: , Rfl:  .  Fluticasone Furoate (ARNUITY ELLIPTA) 100 MCG/ACT AEPB, Inhale 1 puff into the lungs daily., Disp: 30 each, Rfl: 2 .  hydrochlorothiazide (HYDRODIURIL) 25 MG tablet, Take 1 tablet (25 mg total) by mouth daily., Disp: 90 tablet, Rfl: 1 .  HYDROcodone-acetaminophen (NORCO/VICODIN) 5-325 MG tablet, Take 1-2 tablets by mouth every 4 (four) hours as needed for moderate pain., Disp: 10 tablet, Rfl: 0 .  ipratropium-albuterol  (DUONEB) 0.5-2.5 (3) MG/3ML SOLN, USE ONE VIAL IN NEBULIZER 4 TIMES DAILY, Disp: 360 mL, Rfl: 5 .  OXYGEN, Inhale 2 L into the lungs. When exerting self, Disp: , Rfl:  .  sodium chloride (OCEAN) 0.65 % SOLN nasal spray, Place 1 spray into both nostrils as needed (dryness)., Disp: , Rfl:   Past Medical History: Past Medical History:  Diagnosis Date  . Abdominal aneurysm ([place])    Dr. Oneida Alar follows lLOV 2 ''17 per pt "around 2 cm"  . Anemia    as a child  . Arthritis    left ankle, right knee, right SI joint, wrists, lower back  . COPD (chronic obstructive pulmonary disease) ([place])    ephysema-Dr. Chase Caller  . Dyspnea   . Headache    as a child would have terrible headaches during season changes  . 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 Hyperlipidemia   . Hypertension   . Multiple thyroid nodules   . Murmur, cardiac 1950  . Pneumonia   . Pre-diabetes   . Requires continuous at home supplemental oxygen    2 L 24/7  . Sebaceous cyst    hairline sebaceous cyst left posterior neck to be excised 04-13-16 by Dr. Harlow Asa in [facility]  . Sleep apnea    cpap used sometimes, uses oxygen concentrator 2 l/m nasally bedtime  . Varicella as child    Tobacco Use: History  Smoking Status  . Former Smoker  . Packs/day: 1.00  . Years: 50.00  . Types: Cigarettes  .  Quit date: 04/16/2011  Smokeless Tobacco  . Never Used    Comment: quit that date when she had to go to ER     Labs: Recent Review Flowsheet Data    Labs for ITP Cardiac and Pulmonary Rehab Latest Ref Rng & Units 07/06/2012 02/11/2014 09/05/2014 10/14/2015 01/08/2016   Cholestrol 0 - 200 mg/dL 183 193 199 201(H) -   LDLCALC 0 - 99 mg/dL 107(H) - - 123(H) -   LDLDIRECT mg/dL - 103.7 121.0 - -   HDL >39.00 mg/dL 57.50 48.10 49.50 61.30 -   Trlycerides 0.0 - 149.0 mg/dL 92.0 429.0(H) 210.0(H) 83.0 -   Hemoglobin A1c 4.6 - 6.5 % - - - - 6.5      Capillary Blood Glucose: Lab Results  Component Value Date   GLUCAP  114 (H) 04/13/2016     ADL UCSD:   Pulmonary Function Assessment:     Pulmonary Function Assessment - 02/23/16 0923      Breath   Bilateral Breath Sounds Decreased   Shortness of Breath Limiting activity;Yes      Exercise Target Goals:    Exercise Program Goal: Individual exercise prescription set with THRR, safety & activity barriers. Participant demonstrates ability to understand and report RPE using BORG scale, to self-measure pulse accurately, and to acknowledge the importance of the exercise prescription.  Exercise Prescription Goal: Starting with aerobic activity 30 plus minutes a day, 3 days per week for initial exercise prescription. Provide home exercise prescription and guidelines that participant acknowledges understanding prior to discharge.  Activity Barriers & Risk Stratification:     Activity Barriers & Cardiac Risk Stratification - 02/23/16 0918      Activity Barriers & Cardiac Risk Stratification   Activity Barriers Arthritis;Back Problems;Shortness of Breath;Deconditioning      6 Minute Walk:     6 Minute Walk    Row Name 03/04/16 1638         6 Minute Walk   Phase Initial     Distance 1100 feet     Walk Time -  5 minutes and 20 seconds     # of Rest Breaks 1  40 seconds     MPH 2.08     METS 2.61     RPE 13     Perceived Dyspnea  2     Symptoms No     Resting HR 83 bpm     Resting BP 140/80     Max Ex. HR 100 bpm     Max Ex. BP 150/70       Interval HR   Baseline HR 83     1 Minute HR 71     2 Minute HR 71     3 Minute HR 100     4 Minute HR 94     5 Minute HR 92     6 Minute HR 92     2 Minute Post HR 88     Interval Heart Rate? Yes       Interval Oxygen   Interval Oxygen? Yes     Baseline Oxygen Saturation % 93 %     Baseline Liters of Oxygen 2 L     1 Minute Oxygen Saturation % 92 %     1 Minute Liters of Oxygen 2 L     2 Minute Oxygen Saturation % 89 %     2 Minute Liters of Oxygen 2 L     3 Minute Oxygen  Saturation %  87 %     3 Minute Liters of Oxygen 2 L     4 Minute Oxygen Saturation % 90 %     4 Minute Liters of Oxygen 3 L     5 Minute Oxygen Saturation % 94 %     5 Minute Liters of Oxygen 3 L     6 Minute Oxygen Saturation % 94 %     6 Minute Liters of Oxygen 3 L     2 Minute Post Oxygen Saturation % 97 %     2 Minute Post Liters of Oxygen 3 L        Initial Exercise Prescription:     Initial Exercise Prescription - 03/04/16 1600      Date of Initial Exercise RX and Referring Provider   Date 03/04/16   Referring Provider Dr. Chase Caller     Oxygen   Oxygen Continuous   Liters 3     NuStep   Level 2   Minutes 17     Track   Laps 5   Minutes 17     Prescription Details   Frequency (times per week) 2   Duration Progress to 45 minutes of aerobic exercise without signs/symptoms of physical distress     Intensity   THRR 40-80% of Max Heartrate 59-118   Ratings of Perceived Exertion 11-13   Perceived Dyspnea 0-4     Progression   Progression Continue progressive overload as per policy without signs/symptoms or physical distress.     Resistance Training   Training Prescription Yes   Weight Green bands   Reps 10-12      Perform Capillary Blood Glucose checks as needed.  Exercise Prescription Changes:     Exercise Prescription Changes    Row Name 03/11/16 1600 03/16/16 1500 03/23/16 1618 03/25/16 1600 04/20/16 1500     Response to Exercise   Blood Pressure (Admit) 150/70 130/50 140/60 140/72 142/64   Blood Pressure (Exercise) 144/80 150/64 148/78 160/80 160/80   Blood Pressure (Exit) 150/64 130/66 132/60 124/60 124/60   Heart Rate (Admit) 88 bpm 78 bpm 80 bpm 80 bpm 74 bpm   Heart Rate (Exercise) 91 bpm 97 bpm 90 bpm 85 bpm 92 bpm   Heart Rate (Exit) 77 bpm 72 bpm 88 bpm 77 bpm 81 bpm   Oxygen Saturation (Admit) 88 % 90 % 90 % 92 % 98 %   Oxygen Saturation (Exercise) 95 % 94 % 93 % 94 % 95 %   Oxygen Saturation (Exit) 99 % 97 % 97 % 96 % 94 %   Rating of  Perceived Exertion (Exercise) 15 13 14 13 15    Perceived Dyspnea (Exercise) 3 3 3 1 2    Duration Progress to 45 minutes of aerobic exercise without signs/symptoms of physical distress Progress to 45 minutes of aerobic exercise without signs/symptoms of physical distress Progress to 45 minutes of aerobic exercise without signs/symptoms of physical distress Progress to 45 minutes of aerobic exercise without signs/symptoms of physical distress Progress to 45 minutes of aerobic exercise without signs/symptoms of physical distress   Intensity -  40-80% intensity of THRR THRR unchanged THRR unchanged THRR unchanged THRR unchanged     Progression   Progression Continue to progress workloads to maintain intensity without signs/symptoms of physical distress. Continue to progress workloads to maintain intensity without signs/symptoms of physical distress. Continue to progress workloads to maintain intensity without signs/symptoms of physical distress. Continue to progress workloads to maintain intensity without signs/symptoms of physical  distress. Continue to progress workloads to maintain intensity without signs/symptoms of physical distress.     Resistance Training   Training Prescription Yes Yes Yes Yes Yes   Weight green bands green bands green bands green bands green bands   Reps 10-12  10 minutes of strength training 10-12  10 minutes of strength training 10-12  10 minutes of strength training 10-12  10 minutes of strength training 10-12  10 minutes of strength training     Interval Training   Interval Training No No No No No     Oxygen   Oxygen Intermittent Continuous Continuous Continuous Continuous   Liters 3 3 3 3   -     NuStep   Level 2 2 2 2 2    Minutes 17 17 17 17 17    METs 1.4 1.8 1.9 1.9 2     Track   Laps 7 16 13 6 15    Minutes 17 34 30 17 [place] Name 04/22/16 1600 05/04/16 1600           Exercise Review   Progression Yes Yes        Response to Exercise   Blood  Pressure (Admit) 156/60 144/60      Blood Pressure (Exercise) 160/88 170/70      Blood Pressure (Exit) 152/70 156/66      Heart Rate (Admit) 79 bpm 89 bpm      Heart Rate (Exercise) 98 bpm 114 bpm      Heart Rate (Exit) 79 bpm 97 bpm      Oxygen Saturation (Admit) 84 %  on room air with rest increased to 89 92 %      Oxygen Saturation (Exercise) 92 % 93 %      Oxygen Saturation (Exit) 97 % 95 %      Rating of Perceived Exertion (Exercise) 11 13      Perceived Dyspnea (Exercise) 1 2      Duration Progress to 45 minutes of aerobic exercise without signs/symptoms of physical distress Progress to 45 minutes of aerobic exercise without signs/symptoms of physical distress      Intensity THRR unchanged THRR unchanged        Progression   Progression Continue to progress workloads to maintain intensity without signs/symptoms of physical distress. Continue to progress workloads to maintain intensity without signs/symptoms of physical distress.        Resistance Training   Training Prescription Yes Yes      Weight green bands green bands      Reps 10-12  10 minutes of strength training 10-12  10 minutes of strength training        Interval Training   Interval Training No No        Oxygen   Oxygen Continuous Continuous      Liters 3 3        NuStep   Level 3 5      Minutes 17 17      METs 2 2        Track   Laps 10 15      Minutes 17 34         Exercise Comments:     Exercise Comments    Row Name 03/22/16 1020 04/19/16 1704 05/06/16 0952       Exercise Comments Patient has only attended two sessions. Will cont. to monitor. Patient has only attended 4 sessions. Attendance is extremely poor. Will discuss when patient returns to rehab.  Attendance is extremely poor. No progress has been made. Patient has only attended 7 sessions since 03/11/16.         Discharge Exercise Prescription (Final Exercise Prescription Changes):     Exercise Prescription Changes - 05/04/16 1600       Exercise Review   Progression Yes     Response to Exercise   Blood Pressure (Admit) 144/60   Blood Pressure (Exercise) 170/70   Blood Pressure (Exit) 156/66   Heart Rate (Admit) 89 bpm   Heart Rate (Exercise) 114 bpm   Heart Rate (Exit) 97 bpm   Oxygen Saturation (Admit) 92 %   Oxygen Saturation (Exercise) 93 %   Oxygen Saturation (Exit) 95 %   Rating of Perceived Exertion (Exercise) 13   Perceived Dyspnea (Exercise) 2   Duration Progress to 45 minutes of aerobic exercise without signs/symptoms of physical distress   Intensity THRR unchanged     Progression   Progression Continue to progress workloads to maintain intensity without signs/symptoms of physical distress.     Resistance Training   Training Prescription Yes   Weight green bands   Reps 10-12  10 minutes of strength training     Interval Training   Interval Training No     Oxygen   Oxygen Continuous   Liters 3     NuStep   Level 5   Minutes 17   METs 2     Track   Laps 15   Minutes 34       Nutrition:  Target Goals: Understanding of nutrition guidelines, daily intake of sodium 1500mg , cholesterol 200mg , calories 30% from fat and 7% or less from saturated fats, daily to have 5 or more servings of fruits and vegetables.  Biometrics:     Pre Biometrics - 02/23/16 0929      Pre Biometrics   Grip Strength 27 kg       Nutrition Therapy Plan and Nutrition Goals:     Nutrition Therapy & Goals - 04/29/16 1419      Nutrition Therapy   Diet Carb Modified, Therapeutic Lifestyle Changes     Personal Nutrition Goals   Personal Goal #1 1-2 lb wt loss/week to a wt loss goal of 6-24 lb at graduation from [facility], educate and counsel regarding individualized specific dietary modifications aiming towards targeted core components such as weight, hypertension, lipid management, diabetes, heart failure and other comorbidities.   Expected  Outcomes Short Term Goal: Understand basic principles of dietary content, such as calories, fat, sodium, cholesterol and nutrients.;Long Term Goal: Adherence to prescribed nutrition plan.      Nutrition Discharge: Rate Your Plate Scores:   Psychosocial: Target Goals: Acknowledge presence or absence of depression, maximize coping skills, provide positive support system. Participant is able to verbalize types and ability to use techniques and skills needed for reducing stress and depression.  Initial Review & Psychosocial Screening:     Initial Psych Review & Screening - 02/23/16 [place]? Yes     Barriers   Psychosocial barriers to participate in program There are no identifiable barriers or psychosocial needs.     Screening Interventions   Interventions Encouraged to exercise      Quality of Life Scores:   PHQ-9: Recent Review Flowsheet Data    Depression screen [facility] 2/9 02/23/2016 04/07/2015 01/28/2015 10/29/2014 10/07/2014   Decreased Interest 1 1 -  0 0   Down, Depressed, Hopeless 0 1 0 0 0   PHQ - 2 Score 1 2 0 0 0   Altered sleeping - 1 - - -   Tired, decreased energy - 1 - - -   Change in appetite - 1 - - -   Feeling bad or failure about yourself  - 0 - - -   Trouble concentrating - 0 - - -   Moving slowly or fidgety/restless - 0 - - -   Suicidal thoughts - 0 - - -   PHQ-9 Score - 5 - - -      Psychosocial Evaluation and Intervention:     Psychosocial Evaluation - 02/23/16 0925      Psychosocial Evaluation & Interventions   Interventions Encouraged to exercise with the program and follow exercise prescription      Psychosocial Re-Evaluation:     Psychosocial Re-Evaluation    Row Name 03/16/16 1147 03/23/16 0647 04/13/16 1003 05/11/16 0908       Psychosocial Re-Evaluation   Interventions Encouraged to attend Pulmonary Rehabilitation for the exercise - Encouraged to attend Pulmonary Rehabilitation for the exercise  Encouraged to attend Pulmonary Rehabilitation for the exercise    Comments No psychosocial issues identified at this time - No psychosocial issues identified at this time. No psychosocial issues identified.    Continued Psychosocial Services Needed No  - No  -      Education: Education Goals: Education classes will be provided on a weekly basis, covering required topics. Participant will state understanding/return demonstration of topics presented.  Learning Barriers/Preferences:     Learning Barriers/Preferences - 02/23/16 0919      Learning Barriers/Preferences   Learning Barriers None   Learning Preferences Group Instruction;Computer/Internet;Individual Instruction;Verbal Instruction;Written Material      Education Topics: Risk Factor Reduction:  -Group instruction that is supported by a PowerPoint presentation. Instructor discusses the definition of a risk factor, different risk factors for pulmonary disease, and how the heart and lungs work together.     Nutrition for Pulmonary Patient:  -Group instruction provided by PowerPoint slides, verbal discussion, and written materials to support subject matter. The instructor gives an explanation and review of healthy diet recommendations, which includes a discussion on weight management, recommendations for fruit and vegetable consumption, as well as protein, fluid, caffeine, fiber, sodium, sugar, and alcohol. Tips for eating when patients are short of breath are discussed. Flowsheet Row PULMONARY REHAB CHRONIC OBSTRUCTIVE PULMONARY DISEASE from 04/22/2016 in [place]  Date  03/25/16 [facility] Eating During the [place]  Educator  RD  Instruction Review Code  2- meets goals/outcomes      Pursed Lip Breathing:  -Group instruction that is supported by demonstration and informational handouts. Instructor discusses the benefits of pursed lip and diaphragmatic breathing and detailed demonstration on how to  preform both.     Oxygen Safety:  -Group instruction provided by PowerPoint, verbal discussion, and written material to support subject matter. There is an overview of "What is Oxygen" and "Why do we need it".  Instructor also reviews how to create a safe environment for oxygen use, the importance of using oxygen as prescribed, and the risks of noncompliance. There is a brief discussion on traveling with oxygen and resources the patient may utilize.   Oxygen Equipment:  -Group instruction provided by [facility] Staff utilizing handouts, written materials, and equipment demonstrations.   Signs and Symptoms:  -Group instruction provided by written material and verbal  discussion to support subject matter. Warning signs and symptoms of infection, stroke, and heart attack are reviewed and when to call the physician/911 reinforced. Tips for preventing the spread of infection discussed. Flowsheet Row PULMONARY REHAB CHRONIC OBSTRUCTIVE PULMONARY DISEASE from 04/22/2016 in [place]  Date  04/22/16  Educator  rn  Instruction Review Code  2- meets goals/outcomes      Advanced Directives:  -Group instruction provided by verbal instruction and written material to support subject matter. Instructor reviews Advanced Directive laws and proper instruction for filling out document.   Pulmonary Video:  -Group video education that reviews the importance of medication and oxygen compliance, exercise, good nutrition, pulmonary hygiene, and pursed lip and diaphragmatic breathing for the pulmonary patient.   Exercise for the Pulmonary Patient:  -Group instruction that is supported by a PowerPoint presentation. Instructor discusses benefits of exercise, core components of exercise, frequency, duration, and intensity of an exercise routine, importance of utilizing pulse oximetry during exercise, safety while exercising, and options of places to exercise outside of rehab.      Pulmonary Medications:  -Verbally interactive group education provided by instructor with focus on inhaled medications and proper administration.   Anatomy and Physiology of the Respiratory System and Intimacy:  -Group instruction provided by PowerPoint, verbal discussion, and written material to support subject matter. Instructor reviews respiratory cycle and anatomical components of the respiratory system and their functions. Instructor also reviews differences in obstructive and restrictive respiratory diseases with examples of each. Intimacy, Sex, and Sexuality differences are reviewed with a discussion on how relationships can change when diagnosed with pulmonary disease. Common sexual concerns are reviewed.   Knowledge Questionnaire Score:   Core Components/Risk Factors/Patient Goals at Admission:     Personal Goals and Risk Factors at Admission - 02/23/16 0923      Core Components/Risk Factors/Patient Goals on Admission    Weight Management Yes;Obesity   Intervention Obesity: Provide education and appropriate resources to help participant work on and attain dietary goals.;Weight Management/Obesity: Establish reasonable short term and long term weight goals.   Expected Outcomes Short Term: Continue to assess and modify interventions until short term weight is achieved;Long Term: Adherence to nutrition and physical activity/exercise program aimed toward attainment of established weight goal   Improve shortness of breath with ADL's Yes   Intervention Provide education, individualized exercise plan and daily activity instruction to help decrease symptoms of SOB with activities of daily living.   Expected Outcomes Short Term: Achieves a reduction of symptoms when performing activities of daily living.   Increase knowledge of respiratory medications and ability to use respiratory devices properly  Yes   Intervention Provide education and demonstration as needed of appropriate use of  medications, inhalers, and oxygen therapy.   Expected Outcomes Short Term: Achieves understanding of medications use. Understands that oxygen is a medication prescribed by physician. Demonstrates appropriate use of inhaler and oxygen therapy.   Hypertension Yes   Intervention Provide education on lifestyle modifcations including regular physical activity/exercise, weight management, moderate sodium restriction and increased consumption of fresh fruit, vegetables, and low fat dairy, alcohol moderation, and smoking cessation.;Monitor prescription use compliance.   Expected Outcomes Short Term: Continued assessment and intervention until BP is < 140/45mm HG in hypertensive participants. < 130/47mm HG in hypertensive participants with diabetes, heart failure or chronic kidney disease.;Long Term: Maintenance of blood pressure at goal levels.      Core Components/Risk Factors/Patient Goals Review:      Goals and  Risk Factor Review    Row Name 03/16/16 1145 03/23/16 0646 04/13/16 1000 05/11/16 0906 05/11/16 0908     Core Components/Risk Factors/Patient Goals Review   Personal Goals Review Weight Management/Obesity;Hypertension;Improve shortness of breath with ADL's;Develop more efficient breathing techniques such as purse lipped breathing and diaphragmatic breathing and practicing self-pacing with activity.;Increase Strength and Stamina - Weight Management/Obesity;Hypertension;Improve shortness of breath with ADL's;Develop more efficient breathing techniques such as purse lipped breathing and diaphragmatic breathing and practicing self-pacing with activity.;Increase Strength and Stamina Weight Management/Obesity;Hypertension;Improve shortness of breath with ADL's;Develop more efficient breathing techniques such as purse lipped breathing and diaphragmatic breathing and practicing self-pacing with activity.;Increase Strength and Stamina  -   Review Has attended 1 exercise session, too early to meet any  program goals - Very slow progress has only attended 4 exercise sessions since starting program, has been absent due to multiple reasons. No weight loss, hypertension controlled, almost ready to graduate, has leveled out with workloads, prepare to exercise on his own after graduation. Error was made in last entry, not ready to graduate, attendance has been very poor, will discharge from program if this does not improve.   Expected Outcomes expect to see progress with goals in the next full 30 days. - expect improvement with regular attendance, encourage regular attendance expect to continue exercise after graduation. Not ready to graduate, this was an error.      Core Components/Risk Factors/Patient Goals at Discharge (Final Review):      Goals and Risk Factor Review - 05/11/16 0908      Core Components/Risk Factors/Patient Goals Review   Review Error was made in last entry, not ready to graduate, attendance has been very poor, will discharge from program if this does not improve.   Expected Outcomes Not ready to graduate, this was an error.      ITP Comments:   Comments: ITP REVIEW Pt is making expected progress toward pulmonary rehab goals after completing 7 sessions. Recommend continued exercise, life style modification, education, and utilization of breathing techniques to increase stamina and strength and decrease shortness of breath with exertion.

## 2016-05-14 DIAGNOSIS — R0602 Shortness of breath: Secondary | ICD-10-CM | POA: Diagnosis not present

## 2016-05-14 DIAGNOSIS — J449 Chronic obstructive pulmonary disease, unspecified: Secondary | ICD-10-CM | POA: Diagnosis not present

## 2016-05-18 ENCOUNTER — Telehealth: Payer: Self-pay | Admitting: Internal Medicine

## 2016-05-18 ENCOUNTER — Telehealth (HOSPITAL_COMMUNITY): Payer: Self-pay | Admitting: *Deleted

## 2016-05-18 ENCOUNTER — Encounter (HOSPITAL_COMMUNITY): Payer: PPO

## 2016-05-18 MED ORDER — PREDNISONE 20 MG PO TABS: 20.0000 mg | ORAL_TABLET | Freq: Every day | ORAL | 0 refills | Status: DC

## 2016-05-18 NOTE — Telephone Encounter (Signed)
Spoke with pt, who states she was scheduled for pulm rehab today. Pt states she was unable to make it to pulm rehab due to the cold air causing increased sob.  Pt states she went to her son's house on new year's, pt states walking from her car into his house, she had increased sob. Pt states when she got home from her son's she did a neb treatment, that seemed to help. Pt  C/o off and on prod cough with white mucus & post nasal drip X1wk. Pt states the increased sob only occurs when outside in the cold weather mainly.  Pt is requesting recommendations.  CY please advise. Thanks.

## 2016-05-18 NOTE — Telephone Encounter (Signed)
Pt aware of CY recommendations. Rx sent to preferred pharmacy. Pt voiced her understanding and had no further questions. Nothing further needed

## 2016-05-18 NOTE — Telephone Encounter (Signed)
Current Outpatient Prescriptions on File Prior to Visit  Medication Sig Dispense Refill  . albuterol (VENTOLIN HFA) 108 (90 Base) MCG/ACT inhaler Inhale 1 puff into the lungs every 6 (six) hours as needed for wheezing. 18 each 5  . amLODipine (NORVASC) 10 MG tablet Take 1 tablet (10 mg total) by mouth daily. 90 tablet 0  . aspirin-acetaminophen-caffeine (EXCEDRIN MIGRAINE) 250-250-65 MG tablet Take 2 tablets by mouth daily as needed for headache.    . Aspirin-Calcium Carbonate (BAYER WOMENS) 81-300 MG TABS Take 1 tablet by mouth daily with breakfast.    . atorvastatin (LIPITOR) 40 MG tablet Take 40 mg by mouth daily. Takes daily AM    . carvedilol (COREG) 3.125 MG tablet TAKE ONE TABLET BY MOUTH TWICE DAILY WITH MEALS 90 tablet 1  . cholecalciferol (VITAMIN D) 1000 units tablet Take 2,000 Units by mouth daily.    . Fluticasone Furoate (ARNUITY ELLIPTA) 100 MCG/ACT AEPB Inhale 1 puff into the lungs daily. 30 each 2  . hydrochlorothiazide (HYDRODIURIL) 25 MG tablet Take 1 tablet (25 mg total) by mouth daily. 90 tablet 0  . HYDROcodone-acetaminophen (NORCO/VICODIN) 5-325 MG tablet Take 1-2 tablets by mouth every 4 (four) hours as needed for moderate pain. 10 tablet 0  . ipratropium-albuterol (DUONEB) 0.5-2.5 (3) MG/3ML SOLN USE ONE VIAL IN NEBULIZER 4 TIMES DAILY 360 mL 5  . OXYGEN Inhale 2 L into the lungs. When exerting self    . sodium chloride (OCEAN) 0.65 % SOLN nasal spray Place 1 spray into both nostrils as needed (dryness).     No current facility-administered medications on file prior to visit.     Allergies  Allergen Reactions  . Nickel Rash    Severe rash to infection: pt is allergic to all metals other than sterling silver or gold jewelry.   [name] [Olmesartan] Swelling    Swelling of face and arms   . [name] [Valsartan] Swelling    Swelling of face and arms   . Lisinopril Cough  . Codeine Other (See Comments)    jittery  . Monosodium Glutamate Other (See Comments)   Facial swelling per pt  . Lead Acetate Rash

## 2016-05-18 NOTE — Telephone Encounter (Signed)
Recommend wear warm scarf across nose and mouth any time stepping outdoors in this cold air  Offer prednisone 20 mg, # 5, 1 daily x 5 days to stabilize airways

## 2016-05-20 ENCOUNTER — Encounter (HOSPITAL_COMMUNITY): Payer: PPO

## 2016-05-21 ENCOUNTER — Ambulatory Visit
Admission: RE | Admit: 2016-05-21 | Discharge: 2016-05-21 | Disposition: A | Discharge status: Home / Self Care | Payer: PPO | Source: Ambulatory Visit | Attending: Surgery | Admitting: Surgery

## 2016-05-21 ENCOUNTER — Other Ambulatory Visit (HOSPITAL_COMMUNITY)
Admission: RE | Admit: 2016-05-21 | Discharge: 2016-05-21 | Disposition: A | Discharge status: Home / Self Care | Payer: PPO | Source: Ambulatory Visit | Attending: Radiology | Admitting: Radiology

## 2016-05-21 DIAGNOSIS — E041 Nontoxic single thyroid nodule: Secondary | ICD-10-CM | POA: Diagnosis not present

## 2016-05-21 DIAGNOSIS — E042 Nontoxic multinodular goiter: Secondary | ICD-10-CM

## 2016-05-25 ENCOUNTER — Encounter (HOSPITAL_COMMUNITY)
Admission: RE | Admit: 2016-05-25 | Discharge: 2016-05-25 | Disposition: A | Discharge status: Home / Self Care | Payer: PPO | Source: Ambulatory Visit | Attending: Internal Medicine | Admitting: Internal Medicine

## 2016-05-25 VITALS — Wt 226.2 lb

## 2016-05-25 DIAGNOSIS — J449 Chronic obstructive pulmonary disease, unspecified: Secondary | ICD-10-CM

## 2016-05-25 DIAGNOSIS — Z87891 Personal history of nicotine dependence: Secondary | ICD-10-CM | POA: Insufficient documentation

## 2016-05-25 DIAGNOSIS — J438 Other emphysema: Secondary | ICD-10-CM

## 2016-05-25 NOTE — Progress Notes (Signed)
Daily Session Note  Patient Details  Name: Victoria [name] MRN: [id number] Date of Birth: February 02, 1946 Referring Provider:   April Manson Pulmonary Rehab Walk Test from 03/04/2016 in [place]  Referring Provider  Dr. Chase Caller      Encounter Date: 05/25/2016  Check In:     Session Check In - 05/25/16 1330      Check-In   Location [facility]   Staff Present Rosebud Poles, RN, BSN;Molly diVincenzo, MS, ACSM RCEP, Exercise Physiologist;Lisa Ysidro Evert, RN;Portia Rollene Rotunda, RN, BSN   Supervising physician immediately available to respond to emergencies [facility] immediately available   Physician(s) Dr. Tana Coast   Medication changes reported     No   Fall or balance concerns reported    No   Warm-up and Cool-down Performed as group-led instruction   Resistance Training Performed Yes   VAD Patient? No     Pain Assessment   Currently in Pain? No/denies   Multiple Pain Sites No      Capillary Blood Glucose: No results found for this or any previous visit (from the past 24 hour(s)).      Exercise Prescription Changes - 05/25/16 1500      Response to Exercise   Blood Pressure (Admit) 134/62   Blood Pressure (Exercise) 154/80   Blood Pressure (Exit) 136/70   Heart Rate (Admit) 96 bpm   Heart Rate (Exercise) 88 bpm   Heart Rate (Exit) 86 bpm   Oxygen Saturation (Admit) 96 %   Oxygen Saturation (Exercise) 91 %   Oxygen Saturation (Exit) 97 %   Rating of Perceived Exertion (Exercise) 13   Perceived Dyspnea (Exercise) 3   Duration Progress to 45 minutes of aerobic exercise without signs/symptoms of physical distress   Intensity THRR unchanged     Progression   Progression Continue to progress workloads to maintain intensity without signs/symptoms of physical distress.     Resistance Training   Training Prescription Yes   Weight green bands   Reps 10-12  10 minutes of strength training     Interval Training   Interval  Training No     Oxygen   Oxygen Continuous   Liters 3     NuStep   Level 3  has been absent, reduced workload    Minutes 17   METs 2     Track   Laps 5   Minutes 34     Goals Met:  Exercise tolerated well Strength training completed today  Goals Unmet:  Not Applicable  Comments: Service time is from 1330 to 1500    Dr. Rush Farmer is Medical Director for Pulmonary Rehab at [facility].

## 2016-05-27 ENCOUNTER — Encounter (HOSPITAL_COMMUNITY): Payer: PPO

## 2016-05-28 ENCOUNTER — Telehealth (HOSPITAL_COMMUNITY): Payer: Self-pay | Admitting: Internal Medicine

## 2016-05-28 NOTE — Telephone Encounter (Signed)
Pt called missed class on 05/27/16 wants information for Dietitician advised Dietiticionist will give her some information when she comes into her next class she was ok with this. ... KJ

## 2016-06-01 ENCOUNTER — Encounter (HOSPITAL_COMMUNITY): Payer: PPO

## 2016-06-03 ENCOUNTER — Ambulatory Visit: Payer: Medicare HMO | Admitting: Family

## 2016-06-03 ENCOUNTER — Other Ambulatory Visit (HOSPITAL_COMMUNITY): Payer: PPO

## 2016-06-03 ENCOUNTER — Encounter (HOSPITAL_COMMUNITY): Admission: RE | Admit: 2016-06-03 | Payer: PPO | Source: Ambulatory Visit

## 2016-06-08 ENCOUNTER — Telehealth (HOSPITAL_COMMUNITY): Payer: Self-pay | Admitting: Internal Medicine

## 2016-06-08 ENCOUNTER — Telehealth: Payer: Self-pay | Admitting: Internal Medicine

## 2016-06-08 ENCOUNTER — Encounter (HOSPITAL_COMMUNITY)
Admission: RE | Admit: 2016-06-08 | Discharge: 2016-06-08 | Disposition: A | Discharge status: Home / Self Care | Payer: PPO | Source: Ambulatory Visit | Attending: Internal Medicine | Admitting: Internal Medicine

## 2016-06-08 DIAGNOSIS — J438 Other emphysema: Secondary | ICD-10-CM

## 2016-06-08 DIAGNOSIS — J449 Chronic obstructive pulmonary disease, unspecified: Secondary | ICD-10-CM

## 2016-06-08 NOTE — Telephone Encounter (Signed)
Spoke with pt,aware of recs.  Nothing further needed.  

## 2016-06-08 NOTE — Progress Notes (Signed)
Pulmonary Individual Treatment Plan  Patient Details  Name: Victoria [name] MRN: [id number] Date of Birth: 1945-10-06 Referring Provider:   April Manson Pulmonary Rehab Walk Test from 03/04/2016 in [place]  Referring Provider  Dr. Chase Caller      Initial Encounter Date:  Flowsheet Row Pulmonary Rehab Walk Test from 03/04/2016 in [place]  Date  03/04/16  Referring Provider  Dr. Chase Caller      Visit Diagnosis: Chronic obstructive pulmonary disease, unspecified COPD type ([place])  Other emphysema ([place])  [name] Home Medications on Admission:   Current Outpatient Prescriptions:  .  albuterol (VENTOLIN HFA) 108 (90 Base) MCG/ACT inhaler, Inhale 1 puff into the lungs every 6 (six) hours as needed for wheezing., Disp: 18 each, Rfl: 5 .  amLODipine (NORVASC) 10 MG tablet, Take 1 tablet (10 mg total) by mouth daily., Disp: 90 tablet, Rfl: 0 .  aspirin-acetaminophen-caffeine (EXCEDRIN MIGRAINE) 250-250-65 MG tablet, Take 2 tablets by mouth daily as needed for headache., Disp: , Rfl:  .  Aspirin-Calcium Carbonate (BAYER WOMENS) 81-300 MG TABS, Take 1 tablet by mouth daily with breakfast., Disp: , Rfl:  .  atorvastatin (LIPITOR) 40 MG tablet, Take 40 mg by mouth daily. Takes daily AM, Disp: , Rfl:  .  carvedilol (COREG) 3.125 MG tablet, TAKE ONE TABLET BY MOUTH TWICE DAILY WITH MEALS, Disp: 90 tablet, Rfl: 1 .  cholecalciferol (VITAMIN D) 1000 units tablet, Take 2,000 Units by mouth daily., Disp: , Rfl:  .  Fluticasone Furoate (ARNUITY ELLIPTA) 100 MCG/ACT AEPB, Inhale 1 puff into the lungs daily., Disp: 30 each, Rfl: 2 .  hydrochlorothiazide (HYDRODIURIL) 25 MG tablet, Take 1 tablet (25 mg total) by mouth daily., Disp: 90 tablet, Rfl: 0 .  HYDROcodone-acetaminophen (NORCO/VICODIN) 5-325 MG tablet, Take 1-2 tablets by mouth every 4 (four) hours as needed for moderate pain., Disp: 10 tablet, Rfl: 0 .  ipratropium-albuterol  (DUONEB) 0.5-2.5 (3) MG/3ML SOLN, USE ONE VIAL IN NEBULIZER 4 TIMES DAILY, Disp: 360 mL, Rfl: 5 .  OXYGEN, Inhale 2 L into the lungs. When exerting self, Disp: , Rfl:  .  predniSONE (DELTASONE) 20 MG tablet, Take 1 tablet (20 mg total) by mouth daily with breakfast., Disp: 5 tablet, Rfl: 0 .  sodium chloride (OCEAN) 0.65 % SOLN nasal spray, Place 1 spray into both nostrils as needed (dryness)., Disp: , Rfl:   Past Medical History: Past Medical History:  Diagnosis Date  . Abdominal aneurysm ([place])    Dr. Oneida Alar follows lLOV 2 ''17 per pt "around 2 cm"  . Anemia    as a child  . Arthritis    left ankle, right knee, right SI joint, wrists, lower back  . COPD (chronic obstructive pulmonary disease) ([place])    ephysema-Dr. Chase Caller  . Dyspnea   . Headache    as a child would have terrible headaches during season changes  . 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 Hyperlipidemia   . Hypertension   . Multiple thyroid nodules   . Murmur, cardiac 1950  . Pneumonia   . Pre-diabetes   . Requires continuous at home supplemental oxygen    2 L 24/7  . Sebaceous cyst    hairline sebaceous cyst left posterior neck to be excised 04-13-16 by Dr. Harlow Asa in [facility]  . Sleep apnea    cpap used sometimes, uses oxygen concentrator 2 l/m nasally bedtime  . Varicella as child  Tobacco Use: History  Smoking Status  . Former Smoker  . Packs/day: 1.00  . Years: 50.00  . Types: Cigarettes  . Quit date: 04/16/2011  Smokeless Tobacco  . Never Used    Comment: quit that date when she had to go to ER     Labs: Recent Review Flowsheet Data    Labs for ITP Cardiac and Pulmonary Rehab Latest Ref Rng & Units 07/06/2012 02/11/2014 09/05/2014 10/14/2015 01/08/2016   Cholestrol 0 - 200 mg/dL 183 193 199 201(H) -   LDLCALC 0 - 99 mg/dL 107(H) - - 123(H) -   LDLDIRECT mg/dL - 103.7 121.0 - -   HDL >39.00 mg/dL 57.50 48.10 49.50 61.30 -   Trlycerides 0.0 - 149.0 mg/dL 92.0 429.0(H) 210.0(H) 83.0  -   Hemoglobin A1c 4.6 - 6.5 % - - - - 6.5      Capillary Blood Glucose: Lab Results  Component Value Date   GLUCAP 114 (H) 04/13/2016     ADL UCSD:   Pulmonary Function Assessment:     Pulmonary Function Assessment - 02/23/16 0923      Breath   Bilateral Breath Sounds Decreased   Shortness of Breath Limiting activity;Yes      Exercise Target Goals:    Exercise Program Goal: Individual exercise prescription set with THRR, safety & activity barriers. Participant demonstrates ability to understand and report RPE using BORG scale, to self-measure pulse accurately, and to acknowledge the importance of the exercise prescription.  Exercise Prescription Goal: Starting with aerobic activity 30 plus minutes a day, 3 days per week for initial exercise prescription. Provide home exercise prescription and guidelines that participant acknowledges understanding prior to discharge.  Activity Barriers & Risk Stratification:     Activity Barriers & Cardiac Risk Stratification - 02/23/16 0918      Activity Barriers & Cardiac Risk Stratification   Activity Barriers Arthritis;Back Problems;Shortness of Breath;Deconditioning      6 Minute Walk:     6 Minute Walk    Row Name 03/04/16 1638         6 Minute Walk   Phase Initial     Distance 1100 feet     Walk Time -  5 minutes and 20 seconds     # of Rest Breaks 1  40 seconds     MPH 2.08     METS 2.61     RPE 13     Perceived Dyspnea  2     Symptoms No     Resting HR 83 bpm     Resting BP 140/80     Max Ex. HR 100 bpm     Max Ex. BP 150/70       Interval HR   Baseline HR 83     1 Minute HR 71     2 Minute HR 71     3 Minute HR 100     4 Minute HR 94     5 Minute HR 92     6 Minute HR 92     2 Minute Post HR 88     Interval Heart Rate? Yes       Interval Oxygen   Interval Oxygen? Yes     Baseline Oxygen Saturation % 93 %     Baseline Liters of Oxygen 2 L     1 Minute Oxygen Saturation % 92 %     1 Minute  Liters of Oxygen 2 L     2 Minute Oxygen  Saturation % 89 %     2 Minute Liters of Oxygen 2 L     3 Minute Oxygen Saturation % 87 %     3 Minute Liters of Oxygen 2 L     4 Minute Oxygen Saturation % 90 %     4 Minute Liters of Oxygen 3 L     5 Minute Oxygen Saturation % 94 %     5 Minute Liters of Oxygen 3 L     6 Minute Oxygen Saturation % 94 %     6 Minute Liters of Oxygen 3 L     2 Minute Post Oxygen Saturation % 97 %     2 Minute Post Liters of Oxygen 3 L        Initial Exercise Prescription:     Initial Exercise Prescription - 03/04/16 1600      Date of Initial Exercise RX and Referring Provider   Date 03/04/16   Referring Provider Dr. Chase Caller     Oxygen   Oxygen Continuous   Liters 3     NuStep   Level 2   Minutes 17     Track   Laps 5   Minutes 17     Prescription Details   Frequency (times per week) 2   Duration Progress to 45 minutes of aerobic exercise without signs/symptoms of physical distress     Intensity   THRR 40-80% of Max Heartrate 59-118   Ratings of Perceived Exertion 11-13   Perceived Dyspnea 0-4     Progression   Progression Continue progressive overload as per policy without signs/symptoms or physical distress.     Resistance Training   Training Prescription Yes   Weight Green bands   Reps 10-12      Perform Capillary Blood Glucose checks as needed.  Exercise Prescription Changes:     Exercise Prescription Changes    Row Name 03/11/16 1600 03/16/16 1500 03/23/16 1618 03/25/16 1600 04/20/16 1500     Response to Exercise   Blood Pressure (Admit) 150/70 130/50 140/60 140/72 142/64   Blood Pressure (Exercise) 144/80 150/64 148/78 160/80 160/80   Blood Pressure (Exit) 150/64 130/66 132/60 124/60 124/60   Heart Rate (Admit) 88 bpm 78 bpm 80 bpm 80 bpm 74 bpm   Heart Rate (Exercise) 91 bpm 97 bpm 90 bpm 85 bpm 92 bpm   Heart Rate (Exit) 77 bpm 72 bpm 88 bpm 77 bpm 81 bpm   Oxygen Saturation (Admit) 88 % 90 % 90 % 92 % 98 %    Oxygen Saturation (Exercise) 95 % 94 % 93 % 94 % 95 %   Oxygen Saturation (Exit) 99 % 97 % 97 % 96 % 94 %   Rating of Perceived Exertion (Exercise) _0 Perceived Dyspnea (Exercise) _1 Duration Progress to 45 minutes of aerobic exercise without signs/symptoms of physical distress Progress to 45 minutes of aerobic exercise without signs/symptoms of physical distress Progress to 45 minutes of aerobic exercise without signs/symptoms of physical distress Progress to 45 minutes of aerobic exercise without signs/symptoms of physical distress Progress to 45 minutes of aerobic exercise without signs/symptoms of physical distress   Intensity -  40-80% intensity of THRR THRR unchanged THRR unchanged THRR unchanged THRR unchanged     Progression   Progression Continue to progress workloads to maintain intensity without signs/symptoms of physical distress. Continue to progress workloads to maintain intensity without signs/symptoms of physical  distress. Continue to progress workloads to maintain intensity without signs/symptoms of physical distress. Continue to progress workloads to maintain intensity without signs/symptoms of physical distress. Continue to progress workloads to maintain intensity without signs/symptoms of physical distress.     Resistance Training   Training Prescription _0    Weight _1    Reps 10-12  10 minutes of strength training 10-12  10 minutes of strength training 10-12  10 minutes of strength training 10-12  10 minutes of strength training 10-12  10 minutes of strength training     Interval Training   Interval Training _2      Oxygen   Oxygen Intermittent Continuous Continuous Continuous Continuous   Liters _3 -     NuStep   Level _4 Minutes _5 METs 1.4 1.8 1.9 1.9 2     Track   Laps _6 Minutes 19 34 26 17 3   Row Name  04/22/16 1600 05/04/16 1600 05/25/16 1500         Exercise Review   Progression Yes Yes  -       Response to Exercise   Blood Pressure (Admit) 156/60 144/60 134/62     Blood Pressure (Exercise) 160/88 170/70 154/80     Blood Pressure (Exit) 152/70 156/66 136/70     Heart Rate (Admit) 79 bpm 89 bpm 96 bpm     Heart Rate (Exercise) 98 bpm 114 bpm 88 bpm     Heart Rate (Exit) 79 bpm 97 bpm 86 bpm     Oxygen Saturation (Admit) 84 %  on room air with rest increased to 89 92 % 96 %     Oxygen Saturation (Exercise) 92 % 93 % 91 %     Oxygen Saturation (Exit) 97 % 95 % 97 %     Rating of Perceived Exertion (Exercise) _7 Perceived Dyspnea (Exercise) _8 Duration Progress to 45 minutes of aerobic exercise without signs/symptoms of physical distress Progress to 45 minutes of aerobic exercise without signs/symptoms of physical distress Progress to 45 minutes of aerobic exercise without signs/symptoms of physical distress     Intensity THRR unchanged THRR unchanged THRR unchanged       Progression   Progression Continue to progress workloads to maintain intensity without signs/symptoms of physical distress. Continue to progress workloads to maintain intensity without signs/symptoms of physical distress. Continue to progress workloads to maintain intensity without signs/symptoms of physical distress.       Resistance Training   Training Prescription Yes Yes Yes     Weight green bands green bands green bands     Reps 10-12  10 minutes of strength training 10-12  10 minutes of strength training 10-12  10 minutes of strength training       Interval Training   Interval Training No No No       Oxygen   Oxygen Continuous Continuous Continuous     Liters _9 NuStep   Level _10 has been absent, reduced workload      Minutes _11 METs _12 Track   Laps 10 15 5  Minutes 17 34 34        Exercise Comments:     Exercise Comments    Row Name  03/22/16 1020 04/19/16 1704 05/06/16 0952 06/08/16 0734     Exercise Comments Patient has only attended two sessions. Will cont. to monitor. Patient has only attended 4 sessions. Attendance is extremely poor. Will discuss when patient returns to rehab.  Attendance is extremely poor. No progress has been made. Patient has only attended 7 sessions since 03/11/16.  Patient has only attended class once in the past month due to sickness and inability to get here because of the weather. Will re-evaluate with patient the commitment it takes to be successful in rehab.        Discharge Exercise Prescription (Final Exercise Prescription Changes):     Exercise Prescription Changes - 05/25/16 1500      Response to Exercise   Blood Pressure (Admit) 134/62   Blood Pressure (Exercise) 154/80   Blood Pressure (Exit) 136/70   Heart Rate (Admit) 96 bpm   Heart Rate (Exercise) 88 bpm   Heart Rate (Exit) 86 bpm   Oxygen Saturation (Admit) 96 %   Oxygen Saturation (Exercise) 91 %   Oxygen Saturation (Exit) 97 %   Rating of Perceived Exertion (Exercise) 13   Perceived Dyspnea (Exercise) 3   Duration Progress to 45 minutes of aerobic exercise without signs/symptoms of physical distress   Intensity THRR unchanged     Progression   Progression Continue to progress workloads to maintain intensity without signs/symptoms of physical distress.     Resistance Training   Training Prescription Yes   Weight green bands   Reps 10-12  10 minutes of strength training     Interval Training   Interval Training No     Oxygen   Oxygen Continuous   Liters 3     NuStep   Level 3  has been absent, reduced workload    Minutes 17   METs 2     Track   Laps 5   Minutes 34       Nutrition:  Target Goals: Understanding of nutrition guidelines, daily intake of sodium '1500mg'$ , cholesterol '200mg'$ , calories 30% from fat and 7% or less from saturated fats, daily to have 5 or more servings of fruits and  vegetables.  Biometrics:     Pre Biometrics - 02/23/16 0929      Pre Biometrics   Grip Strength 27 kg       Nutrition Therapy Plan and Nutrition Goals:     Nutrition Therapy & Goals - 04/29/16 1419      Nutrition Therapy   Diet Carb Modified, Therapeutic Lifestyle Changes     Personal Nutrition Goals   Personal Goal #1 1-2 lb wt loss/week to a wt loss goal of 6-24 lb at graduation from [place], educate and counsel regarding individualized specific dietary modifications aiming towards targeted core components such as weight, hypertension, lipid management, diabetes, heart failure and other comorbidities.   Expected Outcomes Short Term Goal: Understand basic principles of dietary content, such as calories, fat, sodium, cholesterol and nutrients.;Long Term Goal: Adherence to prescribed nutrition plan.      Nutrition Discharge: Rate Your Plate Scores:   Psychosocial: Target Goals: Acknowledge presence or absence of depression, maximize coping skills, provide positive support system. Participant is able to verbalize types and ability to use techniques and skills needed for reducing stress and depression.  Initial Review & Psychosocial Screening:     Initial Psych Review & Screening - 02/23/16 0924      Family Dynamics   Good Support System? Yes     Barriers   Psychosocial barriers to participate in program There are no identifiable barriers or psychosocial needs.     Screening Interventions   Interventions Encouraged to exercise      Quality of Life Scores:   PHQ-9: Recent Review Flowsheet Data    Depression screen [facility] 2/9 02/23/2016 04/07/2015 01/28/2015 10/29/2014 10/07/2014   Decreased Interest 1 1 - 0 0   Down, Depressed, Hopeless 0 1 0 0 0   PHQ - 2 Score 1 2 0 0 0   Altered sleeping - 1 - - -   Tired, decreased energy - 1 - - -   Change in appetite - 1 - - -   Feeling bad or failure about yourself  - 0  - - -   Trouble concentrating - 0 - - -   Moving slowly or fidgety/restless - 0 - - -   Suicidal thoughts - 0 - - -   PHQ-9 Score - 5 - - -      Psychosocial Evaluation and Intervention:     Psychosocial Evaluation - 02/23/16 0925      Psychosocial Evaluation & Interventions   Interventions Encouraged to exercise with the program and follow exercise prescription      Psychosocial Re-Evaluation:     Psychosocial Re-Evaluation    Row Name 03/16/16 1147 03/23/16 0647 04/13/16 1003 05/11/16 0908       Psychosocial Re-Evaluation   Interventions Encouraged to attend Pulmonary Rehabilitation for the exercise - Encouraged to attend Pulmonary Rehabilitation for the exercise Encouraged to attend Pulmonary Rehabilitation for the exercise    Comments No psychosocial issues identified at this time - No psychosocial issues identified at this time. No psychosocial issues identified.    Continued Psychosocial Services Needed No  - No  -      Education: Education Goals: Education classes will be provided on a weekly basis, covering required topics. Participant will state understanding/return demonstration of topics presented.  Learning Barriers/Preferences:     Learning Barriers/Preferences - 02/23/16 0919      Learning Barriers/Preferences   Learning Barriers None   Learning Preferences Group Instruction;Computer/Internet;Individual Instruction;Verbal Instruction;Written Material      Education Topics: Risk Factor Reduction:  -Group instruction that is supported by a PowerPoint presentation. Instructor discusses the definition of a risk factor, different risk factors for pulmonary disease, and how the heart and lungs work together.     Nutrition for Pulmonary Patient:  -Group instruction provided by PowerPoint slides, verbal discussion, and written materials to support subject matter. The instructor gives an explanation and review of healthy diet recommendations, which includes a  discussion on weight management, recommendations for fruit and vegetable consumption, as well as protein, fluid, caffeine, fiber, sodium, sugar, and alcohol. Tips for eating when patients are short of breath are discussed. Flowsheet Row PULMONARY REHAB CHRONIC OBSTRUCTIVE PULMONARY DISEASE from 04/22/2016 in [facility] CARDIAC REHAB  Date  03/25/16 [facility] Eating During the Holiday]  Educator  RD  Instruction Review Code  2- meets goals/outcomes      Pursed Lip Breathing:  -Group instruction that is supported by demonstration and informational handouts. Instructor discusses the benefits of pursed lip and diaphragmatic breathing and detailed demonstration on how to preform both.     Oxygen Safety:  -Group instruction provided  by PowerPoint, verbal discussion, and written material to support subject matter. There is an overview of "What is Oxygen" and "Why do we need it".  Instructor also reviews how to create a safe environment for oxygen use, the importance of using oxygen as prescribed, and the risks of noncompliance. There is a brief discussion on traveling with oxygen and resources the patient may utilize.   Oxygen Equipment:  -Group instruction provided by [facility] Staff utilizing handouts, written materials, and equipment demonstrations.   Signs and Symptoms:  -Group instruction provided by written material and verbal discussion to support subject matter. Warning signs and symptoms of infection, stroke, and heart attack are reviewed and when to call the physician/911 reinforced. Tips for preventing the spread of infection discussed. Flowsheet Row PULMONARY REHAB CHRONIC OBSTRUCTIVE PULMONARY DISEASE from 04/22/2016 in [place]  Date  04/22/16  Educator  rn  Instruction Review Code  2- meets goals/outcomes      Advanced Directives:  -Group instruction provided by verbal instruction and written material to support subject matter.  Instructor reviews Advanced Directive laws and proper instruction for filling out document.   Pulmonary Video:  -Group video education that reviews the importance of medication and oxygen compliance, exercise, good nutrition, pulmonary hygiene, and pursed lip and diaphragmatic breathing for the pulmonary patient.   Exercise for the Pulmonary Patient:  -Group instruction that is supported by a PowerPoint presentation. Instructor discusses benefits of exercise, core components of exercise, frequency, duration, and intensity of an exercise routine, importance of utilizing pulse oximetry during exercise, safety while exercising, and options of places to exercise outside of rehab.     Pulmonary Medications:  -Verbally interactive group education provided by instructor with focus on inhaled medications and proper administration.   Anatomy and Physiology of the Respiratory System and Intimacy:  -Group instruction provided by PowerPoint, verbal discussion, and written material to support subject matter. Instructor reviews respiratory cycle and anatomical components of the respiratory system and their functions. Instructor also reviews differences in obstructive and restrictive respiratory diseases with examples of each. Intimacy, Sex, and Sexuality differences are reviewed with a discussion on how relationships can change when diagnosed with pulmonary disease. Common sexual concerns are reviewed.   Knowledge Questionnaire Score:   Core Components/Risk Factors/Patient Goals at Admission:     Personal Goals and Risk Factors at Admission - 02/23/16 0923      Core Components/Risk Factors/Patient Goals on Admission    Weight Management Yes;Obesity   Intervention Obesity: Provide education and appropriate resources to help participant work on and attain dietary goals.;Weight Management/Obesity: Establish reasonable short term and long term weight goals.   Expected Outcomes Short Term: Continue to  assess and modify interventions until short term weight is achieved;Long Term: Adherence to nutrition and physical activity/exercise program aimed toward attainment of established weight goal   Improve shortness of breath with ADL's Yes   Intervention Provide education, individualized exercise plan and daily activity instruction to help decrease symptoms of SOB with activities of daily living.   Expected Outcomes Short Term: Achieves a reduction of symptoms when performing activities of daily living.   Increase knowledge of respiratory medications and ability to use respiratory devices properly  Yes   Intervention Provide education and demonstration as needed of appropriate use of medications, inhalers, and oxygen therapy.   Expected Outcomes Short Term: Achieves understanding of medications use. Understands that oxygen is a medication prescribed by physician. Demonstrates appropriate use of inhaler and oxygen therapy.  Hypertension Yes   Intervention Provide education on lifestyle modifcations including regular physical activity/exercise, weight management, moderate sodium restriction and increased consumption of fresh fruit, vegetables, and low fat dairy, alcohol moderation, and smoking cessation.;Monitor prescription use compliance.   Expected Outcomes Short Term: Continued assessment and intervention until BP is < 140/4m HG in hypertensive participants. < 130/868mHG in hypertensive participants with diabetes, heart failure or chronic kidney disease.;Long Term: Maintenance of blood pressure at goal levels.      Core Components/Risk Factors/Patient Goals Review:      Goals and Risk Factor Review    Row Name 03/16/16 1145 03/23/16 0646 04/13/16 1000 05/11/16 0906 05/11/16 0908     Core Components/Risk Factors/Patient Goals Review   Personal Goals Review Weight Management/Obesity;Hypertension;Improve shortness of breath with ADL's;Develop more efficient breathing techniques such as purse  lipped breathing and diaphragmatic breathing and practicing self-pacing with activity.;Increase Strength and Stamina - Weight Management/Obesity;Hypertension;Improve shortness of breath with ADL's;Develop more efficient breathing techniques such as purse lipped breathing and diaphragmatic breathing and practicing self-pacing with activity.;Increase Strength and Stamina Weight Management/Obesity;Hypertension;Improve shortness of breath with ADL's;Develop more efficient breathing techniques such as purse lipped breathing and diaphragmatic breathing and practicing self-pacing with activity.;Increase Strength and Stamina  -   Review Has attended 1 exercise session, too early to meet any program goals - Very slow progress has only attended 4 exercise sessions since starting program, has been absent due to multiple reasons. No weight loss, hypertension controlled, almost ready to graduate, has leveled out with workloads, prepare to exercise on his own after graduation. Error was made in last entry, not ready to graduate, attendance has been very poor, will discharge from program if this does not improve.   Expected Outcomes expect to see progress with goals in the next full 30 days. - expect improvement with regular attendance, encourage regular attendance expect to continue exercise after graduation. Not ready to graduate, this was an error.   [place] 06/07/16 1516             Core Components/Risk Factors/Patient Goals Review   Personal Goals Review Weight Management/Obesity;Improve shortness of breath with ADL's;Develop more efficient breathing techniques such as purse lipped breathing and diaphragmatic breathing and practicing self-pacing with activity.;Increase Strength and Stamina       Review encouraging regular attendance, giving her more time to commit to the program and improve attendance, no goals have been met       Expected Outcomes regular attendance          Core Components/Risk  Factors/Patient Goals at Discharge (Final Review):      Goals and Risk Factor Review - 06/07/16 1516      Core Components/Risk Factors/Patient Goals Review   Personal Goals Review Weight Management/Obesity;Improve shortness of breath with ADL's;Develop more efficient breathing techniques such as purse lipped breathing and diaphragmatic breathing and practicing self-pacing with activity.;Increase Strength and Stamina   Review encouraging regular attendance, giving her more time to commit to the program and improve attendance, no goals have been met   Expected Outcomes regular attendance      ITP Comments:   Comments: ITP REVIEW Pt is making expected progress toward pulmonary rehab goals after completing 8 sessions. Recommend continued exercise, life style modification, education, and utilization of breathing techniques to increase stamina and strength and decrease shortness of breath with exertion.

## 2016-06-08 NOTE — Telephone Encounter (Signed)
Would use mucinex DM Twice daily  .As needed  Cough /congestion  Saline nasal rinses As needed   May try zyrtec daily As needed  Drainage  Please contact office for sooner follow up if symptoms do not improve or worsen or seek emergency care

## 2016-06-08 NOTE — Telephone Encounter (Signed)
Spoke with pt, who c/o prod cough with white mucus, wheezing, post nasal drip & mild headache X 1d Denies any fever, chills or sweats. Pt hasn't taken any OTC medications.  Pt is requesting recommendations.   TP please advise, as MR is night float.

## 2016-06-10 ENCOUNTER — Encounter (HOSPITAL_COMMUNITY): Payer: PPO

## 2016-06-14 ENCOUNTER — Telehealth (HOSPITAL_COMMUNITY): Payer: Self-pay | Admitting: *Deleted

## 2016-06-14 DIAGNOSIS — J449 Chronic obstructive pulmonary disease, unspecified: Secondary | ICD-10-CM | POA: Diagnosis not present

## 2016-06-14 DIAGNOSIS — R0602 Shortness of breath: Secondary | ICD-10-CM | POA: Diagnosis not present

## 2016-06-15 ENCOUNTER — Encounter (HOSPITAL_COMMUNITY): Admission: RE | Admit: 2016-06-15 | Payer: PPO | Source: Ambulatory Visit

## 2016-06-15 ENCOUNTER — Ambulatory Visit: Payer: PPO | Admitting: Internal Medicine

## 2016-06-15 NOTE — Addendum Note (Signed)
Encounter addended by: Jewel Baize, RD on: 06/15/2016  2:09 PM<BR>    Actions taken: Flowsheet data copied forward, Visit Navigator Flowsheet section accepted

## 2016-06-17 ENCOUNTER — Encounter (HOSPITAL_COMMUNITY): Payer: PPO

## 2016-06-21 ENCOUNTER — Ambulatory Visit: Payer: PPO | Admitting: Internal Medicine

## 2016-06-22 ENCOUNTER — Encounter (HOSPITAL_COMMUNITY): Payer: PPO

## 2016-06-22 NOTE — Addendum Note (Signed)
Encounter addended by: Lance Morin, RN on: 06/22/2016  3:59 PM<BR>    Actions taken: Sign clinical note, Episode resolved

## 2016-06-22 NOTE — Progress Notes (Signed)
Discharge Summary  Patient Details  Name: Victoria [name] MRN: [id number] Date of Birth: March 30, 1946 Referring Provider:   April Manson Pulmonary Rehab Walk Test from 03/04/2016 in [place]  Referring Provider  Dr. Chase Caller       Number of Visits: 8  Reason for Discharge:  Early Exit:  Due to poor attendance and several illnesses.  Smoking History:  History  Smoking Status  . Former Smoker  . Packs/day: 1.00  . Years: 50.00  . Types: Cigarettes  . Quit date: 04/16/2011  Smokeless Tobacco  . Never Used    Comment: quit that date when she had to go to ER     Diagnosis:  Chronic obstructive pulmonary disease, unspecified COPD type ([place])  Other emphysema ([place])  ADL UCSD:   Initial Exercise Prescription:     Initial Exercise Prescription - 03/04/16 1600      Date of Initial Exercise RX and Referring Provider   Date 03/04/16   Referring Provider Dr. Chase Caller     Oxygen   Oxygen Continuous   Liters 3     NuStep   Level 2   Minutes 17     Track   Laps 5   Minutes 17     Prescription Details   Frequency (times per week) 2   Duration Progress to 45 minutes of aerobic exercise without signs/symptoms of physical distress     Intensity   THRR 40-80% of Max Heartrate 59-118   Ratings of Perceived Exertion 11-13   Perceived Dyspnea 0-4     Progression   Progression Continue progressive overload as per policy without signs/symptoms or physical distress.     Resistance Training   Training Prescription Yes   Weight Green bands   Reps 10-12      Discharge Exercise Prescription (Final Exercise Prescription Changes):     Exercise Prescription Changes - 05/25/16 1500      Response to Exercise   Blood Pressure (Admit) 134/62   Blood Pressure (Exercise) 154/80   Blood Pressure (Exit) 136/70   Heart Rate (Admit) 96 bpm   Heart Rate (Exercise) 88 bpm   Heart Rate (Exit) 86 bpm   Oxygen Saturation (Admit) 96 %   Oxygen  Saturation (Exercise) 91 %   Oxygen Saturation (Exit) 97 %   Rating of Perceived Exertion (Exercise) 13   Perceived Dyspnea (Exercise) 3   Duration Progress to 45 minutes of aerobic exercise without signs/symptoms of physical distress   Intensity THRR unchanged     Progression   Progression Continue to progress workloads to maintain intensity without signs/symptoms of physical distress.     Resistance Training   Training Prescription Yes   Weight green bands   Reps 10-12  10 minutes of strength training     Interval Training   Interval Training No     Oxygen   Oxygen Continuous   Liters 3     NuStep   Level 3  has been absent, reduced workload    Minutes 17   METs 2     Track   Laps 5   Minutes 34      Functional Capacity:     6 Minute Walk    Row Name 03/04/16 1638         6 Minute Walk   Phase Initial     Distance 1100 feet     Walk Time -  5 minutes and 20 seconds     # of  Rest Breaks 1  40 seconds     MPH 2.08     METS 2.61     RPE 13     Perceived Dyspnea  2     Symptoms No     Resting HR 83 bpm     Resting BP 140/80     Max Ex. HR 100 bpm     Max Ex. BP 150/70       Interval HR   Baseline HR 83     1 Minute HR 71     2 Minute HR 71     3 Minute HR 100     4 Minute HR 94     5 Minute HR 92     6 Minute HR 92     2 Minute Post HR 88     Interval Heart Rate? Yes       Interval Oxygen   Interval Oxygen? Yes     Baseline Oxygen Saturation % 93 %     Baseline Liters of Oxygen 2 L     1 Minute Oxygen Saturation % 92 %     1 Minute Liters of Oxygen 2 L     2 Minute Oxygen Saturation % 89 %     2 Minute Liters of Oxygen 2 L     3 Minute Oxygen Saturation % 87 %     3 Minute Liters of Oxygen 2 L     4 Minute Oxygen Saturation % 90 %     4 Minute Liters of Oxygen 3 L     5 Minute Oxygen Saturation % 94 %     5 Minute Liters of Oxygen 3 L     6 Minute Oxygen Saturation % 94 %     6 Minute Liters of Oxygen 3 L     2 Minute Post Oxygen  Saturation % 97 %     2 Minute Post Liters of Oxygen 3 L        Psychological, QOL, Others - Outcomes: PHQ 2/9: Depression screen [facility] 2/9 02/23/2016 04/07/2015 01/28/2015 10/29/2014 10/07/2014  Decreased Interest 1 1 - 0 0  Down, Depressed, Hopeless 0 1 0 0 0  PHQ - 2 Score 1 2 0 0 0  Altered sleeping - 1 - - -  Tired, decreased energy - 1 - - -  Change in appetite - 1 - - -  Feeling bad or failure about yourself  - 0 - - -  Trouble concentrating - 0 - - -  Moving slowly or fidgety/restless - 0 - - -  Suicidal thoughts - 0 - - -  PHQ-9 Score - 5 - - -  Some recent data might be hidden    Quality of Life:   Personal Goals: Goals established at orientation with interventions provided to work toward goal.     Personal Goals and Risk Factors at Admission - 02/23/16 0923      Core Components/Risk Factors/Patient Goals on Admission    Weight Management Yes;Obesity   Intervention Obesity: Provide education and appropriate resources to help participant work on and attain dietary goals.;Weight Management/Obesity: Establish reasonable short term and long term weight goals.   Expected Outcomes Short Term: Continue to assess and modify interventions until short term weight is achieved;Long Term: Adherence to nutrition and physical activity/exercise program aimed toward attainment of established weight goal   Improve shortness of breath with ADL's Yes   Intervention Provide education, individualized exercise plan and daily  activity instruction to help decrease symptoms of SOB with activities of daily living.   Expected Outcomes Short Term: Achieves a reduction of symptoms when performing activities of daily living.   Increase knowledge of respiratory medications and ability to use respiratory devices properly  Yes   Intervention Provide education and demonstration as needed of appropriate use of medications, inhalers, and oxygen therapy.   Expected Outcomes Short Term: Achieves understanding  of medications use. Understands that oxygen is a medication prescribed by physician. Demonstrates appropriate use of inhaler and oxygen therapy.   Hypertension Yes   Intervention Provide education on lifestyle modifcations including regular physical activity/exercise, weight management, moderate sodium restriction and increased consumption of fresh fruit, vegetables, and low fat dairy, alcohol moderation, and smoking cessation.;Monitor prescription use compliance.   Expected Outcomes Short Term: Continued assessment and intervention until BP is < 140/46m HG in hypertensive participants. < 130/842mHG in hypertensive participants with diabetes, heart failure or chronic kidney disease.;Long Term: Maintenance of blood pressure at goal levels.       Personal Goals Discharge:     Goals and Risk Factor Review    Row Name 03/16/16 1145 03/23/16 0646 04/13/16 1000 05/11/16 0906 05/11/16 0908     Core Components/Risk Factors/Patient Goals Review   Personal Goals Review Weight Management/Obesity;Hypertension;Improve shortness of breath with ADL's;Develop more efficient breathing techniques such as purse lipped breathing and diaphragmatic breathing and practicing self-pacing with activity.;Increase Strength and Stamina - Weight Management/Obesity;Hypertension;Improve shortness of breath with ADL's;Develop more efficient breathing techniques such as purse lipped breathing and diaphragmatic breathing and practicing self-pacing with activity.;Increase Strength and Stamina Weight Management/Obesity;Hypertension;Improve shortness of breath with ADL's;Develop more efficient breathing techniques such as purse lipped breathing and diaphragmatic breathing and practicing self-pacing with activity.;Increase Strength and Stamina  -   Review Has attended 1 exercise session, too early to meet any program goals - Very slow progress has only attended 4 exercise sessions since starting program, has been absent due to multiple  reasons. No weight loss, hypertension controlled, almost ready to graduate, has leveled out with workloads, prepare to exercise on his own after graduation. Error was made in last entry, not ready to graduate, attendance has been very poor, will discharge from program if this does not improve.   Expected Outcomes expect to see progress with goals in the next full 30 days. - expect improvement with regular attendance, encourage regular attendance expect to continue exercise after graduation. Not ready to graduate, this was an error.   [place] 06/07/16 1516             Core Components/Risk Factors/Patient Goals Review   Personal Goals Review Weight Management/Obesity;Improve shortness of breath with ADL's;Develop more efficient breathing techniques such as purse lipped breathing and diaphragmatic breathing and practicing self-pacing with activity.;Increase Strength and Stamina       Review encouraging regular attendance, giving her more time to commit to the program and improve attendance, no goals have been met       Expected Outcomes regular attendance          Nutrition & Weight - Outcomes:     Pre Biometrics - 02/23/16 0929      Pre Biometrics   Grip Strength 27 kg       Nutrition:     Nutrition Therapy & Goals - 04/29/16 1419      Nutrition Therapy   Diet Carb Modified, Therapeutic Lifestyle Changes     Personal Nutrition Goals   Personal Goal #1 1-2  lb wt loss/week to a wt loss goal of 6-24 lb at graduation from [place], educate and counsel regarding individualized specific dietary modifications aiming towards targeted core components such as weight, hypertension, lipid management, diabetes, heart failure and other comorbidities.   Expected Outcomes Short Term Goal: Understand basic principles of dietary content, such as calories, fat, sodium, cholesterol and nutrients.;Long Term Goal: Adherence to prescribed nutrition  plan.      Nutrition Discharge:   Education Questionnaire Score:   Goals reviewed with patient; copy given to patient.

## 2016-06-23 ENCOUNTER — Encounter: Payer: Self-pay | Admitting: Internal Medicine

## 2016-06-23 ENCOUNTER — Ambulatory Visit (INDEPENDENT_AMBULATORY_CARE_PROVIDER_SITE_OTHER): Payer: PPO | Admitting: Internal Medicine

## 2016-06-23 ENCOUNTER — Other Ambulatory Visit (INDEPENDENT_AMBULATORY_CARE_PROVIDER_SITE_OTHER): Payer: PPO

## 2016-06-23 VITALS — BP 128/70 | HR 85 | Temp 98.3°F | Resp 16 | Ht 64.0 in | Wt 222.0 lb

## 2016-06-23 DIAGNOSIS — E785 Hyperlipidemia, unspecified: Secondary | ICD-10-CM

## 2016-06-23 DIAGNOSIS — M5440 Lumbago with sciatica, unspecified side: Secondary | ICD-10-CM

## 2016-06-23 DIAGNOSIS — J449 Chronic obstructive pulmonary disease, unspecified: Secondary | ICD-10-CM

## 2016-06-23 DIAGNOSIS — I10 Essential (primary) hypertension: Secondary | ICD-10-CM

## 2016-06-23 DIAGNOSIS — E118 Type 2 diabetes mellitus with unspecified complications: Secondary | ICD-10-CM

## 2016-06-23 LAB — BASIC METABOLIC PANEL
BUN: 10 mg/dL (ref 6–23)
CO2: 35 meq/L — AB (ref 19–32)
Calcium: 10 mg/dL (ref 8.4–10.5)
Chloride: 97 mEq/L (ref 96–112)
Creatinine, Ser: 0.7 mg/dL (ref 0.40–1.20)
GFR: 105.67 mL/min (ref >60.00)
GLUCOSE: 107 mg/dL — AB (ref 70–99)
POTASSIUM: 3.6 meq/L (ref 3.5–5.1)
SODIUM: 137 meq/L (ref 135–145)

## 2016-06-23 LAB — HEMOGLOBIN A1C: Hgb A1c MFr Bld: 7.1 % — ABNORMAL HIGH (ref 4.6–6.5)

## 2016-06-23 MED ORDER — HYDROCODONE-ACETAMINOPHEN 5-325 MG PO TABS: 1.0000 | ORAL_TABLET | ORAL | 0 refills | Status: DC | PRN

## 2016-06-23 MED ORDER — ATORVASTATIN CALCIUM 40 MG PO TABS: 40.0000 mg | ORAL_TABLET | Freq: Every day | ORAL | 3 refills | Status: DC

## 2016-06-23 MED ORDER — AMLODIPINE BESYLATE 10 MG PO TABS: 10.0000 mg | ORAL_TABLET | Freq: Every day | ORAL | 3 refills | Status: DC

## 2016-06-23 MED ORDER — FLUTICASONE FUROATE 100 MCG/ACT IN AEPB: 1.0000 | INHALATION_SPRAY | Freq: Every day | RESPIRATORY_TRACT | 11 refills | Status: DC

## 2016-06-23 NOTE — Progress Notes (Signed)
Pre visit review using our clinic review tool, if applicable. No additional management support is needed unless otherwise documented below in the visit note. 

## 2016-06-23 NOTE — Progress Notes (Signed)
Subjective:  Patient ID: Victoria [name], female    DOB: [birth year]  Age: 71 y.o. MRN: [id number]  CC: Back Pain; Hypertension; and Diabetes   HPI Victoria [name] presents for concerns about a 3 day hx of LBP that worsens with movement. She describes the pain as excruciating and sharp but non-radiating and she denies N/W/T in her LE's. She has tried over-the-counter anti-inflammatories without much symptom relief.  She tells me that her blood pressure and blood sugars are well controlled. She complains that carvedilol caused shortness of breath and wheezing so she stopped taking it about 3 weeks ago.  Outpatient Medications Prior to Visit  Medication Sig Dispense Refill  . albuterol (VENTOLIN HFA) 108 (90 Base) MCG/ACT inhaler Inhale 1 puff into the lungs every 6 (six) hours as needed for wheezing. 18 each 5  . Aspirin-Calcium Carbonate (BAYER WOMENS) 81-300 MG TABS Take 1 tablet by mouth daily with breakfast.    . cholecalciferol (VITAMIN D) 1000 units tablet Take 2,000 Units by mouth daily.    . hydrochlorothiazide (HYDRODIURIL) 25 MG tablet Take 1 tablet (25 mg total) by mouth daily. 90 tablet 0  . ipratropium-albuterol (DUONEB) 0.5-2.5 (3) MG/3ML SOLN USE ONE VIAL IN NEBULIZER 4 TIMES DAILY 360 mL 5  . OXYGEN Inhale 2 L into the lungs. When exerting self    . sodium chloride (OCEAN) 0.65 % SOLN nasal spray Place 1 spray into both nostrils as needed (dryness).    [name] amLODipine (NORVASC) 10 MG tablet Take 1 tablet (10 mg total) by mouth daily. 90 tablet 0  . aspirin-acetaminophen-caffeine (EXCEDRIN MIGRAINE) 250-250-65 MG tablet Take 2 tablets by mouth daily as needed for headache.    [name] atorvastatin (LIPITOR) 40 MG tablet Take 40 mg by mouth daily. Takes daily AM    . Fluticasone Furoate (ARNUITY ELLIPTA) 100 MCG/ACT AEPB Inhale 1 puff into the lungs daily. 30 each 2  . HYDROcodone-acetaminophen (NORCO/VICODIN) 5-325 MG tablet Take 1-2 tablets by mouth every 4 (four) hours as needed for  moderate pain. 10 tablet 0  . predniSONE (DELTASONE) 20 MG tablet Take 1 tablet (20 mg total) by mouth daily with breakfast. 5 tablet 0  . carvedilol (COREG) 3.125 MG tablet TAKE ONE TABLET BY MOUTH TWICE DAILY WITH MEALS (Patient not taking: Reported on 06/23/2016) 90 tablet 1   No facility-administered medications prior to visit.     ROS Review of Systems  Constitutional: Negative.  Negative for activity change, chills, diaphoresis, fatigue and unexpected weight change.  HENT: Negative for trouble swallowing.   Eyes: Negative for visual disturbance.  Respiratory: Negative for cough, chest tightness, shortness of breath and wheezing.   Cardiovascular: Negative for chest pain and leg swelling.  Gastrointestinal: Negative for abdominal pain, constipation, diarrhea, nausea and vomiting.  Endocrine: Negative.  Negative for polydipsia, polyphagia and polyuria.  Genitourinary: Negative.  Negative for difficulty urinating.  Musculoskeletal: Positive for back pain. Negative for arthralgias, joint swelling, myalgias and neck stiffness.  Skin: Negative.  Negative for color change and rash.  Allergic/Immunologic: Negative.   Neurological: Negative.  Negative for dizziness, weakness and numbness.  Hematological: Negative.  Negative for adenopathy. Does not bruise/bleed easily.  Psychiatric/Behavioral: Negative.     Objective:  BP 128/70 (BP Location: Left Arm, Patient Position: Sitting, Cuff Size: Normal)   Pulse 85   Temp 98.3 F (36.8 C) (Oral)   Resp 16   Ht 5\' 4"  (1.626 m)   Wt 222 lb (100.7 kg)   SpO2 92%  BMI 38.11 kg/m   BP Readings from Last 3 Encounters:  06/23/16 128/70  04/16/16 (!) 157/75  04/15/16 138/66    Wt Readings from Last 3 Encounters:  06/23/16 222 lb (100.7 kg)  05/25/16 226 lb 3.1 oz (102.6 kg)  05/04/16 221 lb 1.9 oz (100.3 kg)    Physical Exam  Constitutional: She is oriented to person, place, and time. No distress.  HENT:  Mouth/Throat: Oropharynx is  clear and moist. No oropharyngeal exudate.  Eyes: Conjunctivae are normal. Right eye exhibits no discharge. Left eye exhibits no discharge. No scleral icterus.  Neck: Normal range of motion. Neck supple. No JVD present. No tracheal deviation present. No thyromegaly present.  Cardiovascular: Normal rate, regular rhythm, normal heart sounds and intact distal pulses.  Exam reveals no gallop and no friction rub.   No murmur heard. Pulmonary/Chest: Effort normal and breath sounds normal. No stridor. No respiratory distress. She has no wheezes. She has no rales. She exhibits no tenderness.  Abdominal: Soft. Bowel sounds are normal. She exhibits no distension and no mass. There is no tenderness. There is no rebound and no guarding.  Musculoskeletal: Normal range of motion. She exhibits no edema, tenderness or deformity.       Lumbar back: Normal. She exhibits normal range of motion, no tenderness, no bony tenderness, no swelling, no edema, no deformity, no pain and no spasm.  Lymphadenopathy:    She has no cervical adenopathy.  Neurological: She is oriented to person, place, and time. She displays no atrophy, no tremor and normal reflexes. No cranial nerve deficit or sensory deficit. She exhibits normal muscle tone. She displays no seizure activity. Coordination and gait normal.  Neg SLR in BLE  Skin: Skin is warm and dry. No rash noted. She is not diaphoretic. No erythema. No pallor.  Vitals reviewed.   Lab Results  Component Value Date   WBC 8.5 04/13/2016   HGB 13.2 04/13/2016   HCT 39.5 04/13/2016   PLT 217 04/13/2016   GLUCOSE 107 (H) 06/23/2016   CHOL 201 (H) 10/14/2015   TRIG 83.0 10/14/2015   HDL 61.30 10/14/2015   LDLDIRECT 121.0 09/05/2014   LDLCALC 123 (H) 10/14/2015   ALT 5 10/14/2015   AST 10 10/14/2015   NA 137 06/23/2016   K 3.6 06/23/2016   CL 97 06/23/2016   CREATININE 0.70 06/23/2016   BUN 10 06/23/2016   CO2 35 (H) 06/23/2016   TSH 0.47 10/14/2015   INR 1.01  05/28/2014   HGBA1C 7.1 (H) 06/23/2016   Dg Lumbar Spine Complete  Result Date: 06/25/2016 CLINICAL DATA:  Right-sided back pain with radiation into right leg following heavy lifting, initial encounter EXAM: LUMBAR SPINE - COMPLETE 4+ VIEW COMPARISON:  04/07/2015 FINDINGS: Five lumbar type vertebral bodies are well visualized. Vertebral body height is well maintained. Mild anterolisthesis of L4 on L5 is again seen and stable. Facet hypertrophic changes are seen Diffuse aortic calcifications are seen. No other focal abnormality is noted. IMPRESSION: Degenerative anterolisthesis of L4 on L5 stable from the prior exam. No acute abnormality is noted. Electronically Signed   By: Inez Catalina M.D.   On: 06/25/2016 16:20    No results found.  Assessment & Plan:   Taitlyn was seen today for back pain, hypertension and diabetes.  Diagnoses and all orders for this visit:  Type 2 diabetes mellitus with complication, without long-term current use of insulin ([place])- her A1c is 7.1%, her blood sugars are adequately well controlled. -  atorvastatin (LIPITOR) 40 MG tablet; Take 1 tablet (40 mg total) by mouth daily. Takes daily AM -     Basic metabolic panel; Future -     Hemoglobin A1c; Future -     Microalbumin / creatinine urine ratio; Future  Essential hypertension- her blood pressure is adequately well-controlled. Will d'c carvedilol and continue the CCB. -     amLODipine (NORVASC) 10 MG tablet; Take 1 tablet (10 mg total) by mouth daily. -     Basic metabolic panel; Future -     Urinalysis, Routine w reflex microscopic; Future  COPD, severe (HCC) -     Fluticasone Furoate (ARNUITY ELLIPTA) 100 MCG/ACT AEPB; Inhale 1 puff into the lungs daily.  Acute right-sided low back pain with sciatica, sciatica laterality unspecified- Plain films are negative for fracture but there is some degree of DDD, her examination is negative for radiculopathy. Will treat the pain with Norco. -      HYDROcodone-acetaminophen (NORCO/VICODIN) 5-325 MG tablet; Take 1-2 tablets by mouth every 4 (four) hours as needed for moderate pain. -     DG Lumbar Spine Complete; Future  Hyperlipidemia with target LDL less than 100 -     atorvastatin (LIPITOR) 40 MG tablet; Take 1 tablet (40 mg total) by mouth daily. Takes daily AM   I have discontinued Ms. Venteicher carvedilol, aspirin-acetaminophen-caffeine, and predniSONE. I have also changed her atorvastatin. Additionally, I am having her maintain her Aspirin-Calcium Carbonate, albuterol, ipratropium-albuterol, OXYGEN, cholecalciferol, sodium chloride, hydrochlorothiazide, HYDROcodone-acetaminophen, Fluticasone Furoate, and amLODipine.  Meds ordered this encounter  Medications  . HYDROcodone-acetaminophen (NORCO/VICODIN) 5-325 MG tablet    Sig: Take 1-2 tablets by mouth every 4 (four) hours as needed for moderate pain.    Dispense:  60 tablet    Refill:  0  . Fluticasone Furoate (ARNUITY ELLIPTA) 100 MCG/ACT AEPB    Sig: Inhale 1 puff into the lungs daily.    Dispense:  30 each    Refill:  11  . atorvastatin (LIPITOR) 40 MG tablet    Sig: Take 1 tablet (40 mg total) by mouth daily. Takes daily AM    Dispense:  90 tablet    Refill:  3  . amLODipine (NORVASC) 10 MG tablet    Sig: Take 1 tablet (10 mg total) by mouth daily.    Dispense:  90 tablet    Refill:  3     Follow-up: Return if symptoms worsen or fail to improve.  Scarlette Calico, MD

## 2016-06-23 NOTE — Patient Instructions (Signed)

## 2016-06-24 ENCOUNTER — Encounter (HOSPITAL_COMMUNITY): Payer: PPO

## 2016-06-25 ENCOUNTER — Telehealth: Payer: Self-pay

## 2016-06-25 ENCOUNTER — Ambulatory Visit (INDEPENDENT_AMBULATORY_CARE_PROVIDER_SITE_OTHER)
Admission: RE | Admit: 2016-06-25 | Discharge: 2016-06-25 | Disposition: A | Discharge status: Home / Self Care | Payer: PPO | Source: Ambulatory Visit | Attending: Internal Medicine | Admitting: Internal Medicine

## 2016-06-25 DIAGNOSIS — M5440 Lumbago with sciatica, unspecified side: Secondary | ICD-10-CM | POA: Diagnosis not present

## 2016-06-25 DIAGNOSIS — M545 Low back pain: Secondary | ICD-10-CM | POA: Diagnosis not present

## 2016-06-25 NOTE — Telephone Encounter (Signed)
Pt came in today and stated that the pain medication made her nauseous and vomit yesterday. Informed that I would sent note over for review.  Pt went down to xray today. Pt stated that she will try heat and OTC Ibuprofen this weekend.

## 2016-06-28 ENCOUNTER — Encounter: Payer: Self-pay | Admitting: Internal Medicine

## 2016-06-29 ENCOUNTER — Telehealth: Payer: Self-pay | Admitting: Internal Medicine

## 2016-06-29 ENCOUNTER — Encounter: Payer: Self-pay | Admitting: Vascular Surgery

## 2016-06-29 ENCOUNTER — Other Ambulatory Visit: Payer: Self-pay | Admitting: Internal Medicine

## 2016-06-29 ENCOUNTER — Encounter (HOSPITAL_COMMUNITY): Payer: PPO

## 2016-06-29 DIAGNOSIS — M5440 Lumbago with sciatica, unspecified side: Secondary | ICD-10-CM

## 2016-06-29 MED ORDER — TRAMADOL HCL 50 MG PO TABS: 50.0000 mg | ORAL_TABLET | Freq: Four times a day (QID) | ORAL | 2 refills | Status: DC | PRN

## 2016-06-29 NOTE — Telephone Encounter (Signed)
Back xray was positive only for mild arthritis New RX written for pain relief

## 2016-06-29 NOTE — Telephone Encounter (Addendum)
Patient states that Dr. Ronnald Ramp prescribed her hydrocodone for her back.  States this made her sick the day after she took it and does not want to take this anymore.  Patient states she flushed the medication.  Patient would like to know if there is something else Dr. Ronnald Ramp can prescribe her.   Patient is also requesting results of x ray.

## 2016-06-30 ENCOUNTER — Telehealth: Payer: Self-pay | Admitting: Internal Medicine

## 2016-06-30 NOTE — Telephone Encounter (Signed)
rx faxed to pharmacy

## 2016-06-30 NOTE — Telephone Encounter (Signed)
Pt informed of results.

## 2016-06-30 NOTE — Telephone Encounter (Signed)
Corene Cornea from [facility] calling back - he can be reached at [phone] ext [phone] -pr

## 2016-06-30 NOTE — Telephone Encounter (Signed)
Spoke with pt, who states that her oxygen tank on wheels is causing serve back pain. Pt is requesting a portable oxygen concentrator that is not a pulse dosage, as she does not qualify. She states she needs a portable 3G or 4G concentrator, that is no heavier then 5lbs (that looks like a purse). I have lm for Corene Cornea with [facility] to return our call, to see if [facility] carries these POC's. Will await call back  MR please advise on if okay to place order. Thanks.

## 2016-07-01 ENCOUNTER — Telehealth: Payer: Self-pay | Admitting: Internal Medicine

## 2016-07-01 ENCOUNTER — Encounter (HOSPITAL_COMMUNITY): Payer: PPO

## 2016-07-01 DIAGNOSIS — J449 Chronic obstructive pulmonary disease, unspecified: Secondary | ICD-10-CM

## 2016-07-01 NOTE — Telephone Encounter (Signed)
MR  Please Advise-  Pt. Did research and she stated [facility] carries a light weight inogen poc. She is requesting an order for the inogen G3 or G4, she states if she can't get either one of those she will take whatever lightweight inogen poc that they have.

## 2016-07-01 NOTE — Telephone Encounter (Signed)
MR  Please see previous message that was sent to you. Spoke with Corene Cornea and he stated they do not carry a machine by which the pt was describing. The pt. Was made aware she wanted to know if you knew of any other company that may.

## 2016-07-01 NOTE — Telephone Encounter (Signed)
Spoke with pt. She is aware of MR's response. Nothing further was needed. 

## 2016-07-01 NOTE — Telephone Encounter (Signed)
Had to refax this AM due to fax transmission failure.

## 2016-07-01 NOTE — Telephone Encounter (Signed)
Patient states that [facility] on [address] did not get fax for tramadol.  Please refax.

## 2016-07-01 NOTE — Telephone Encounter (Signed)
Notified patient.

## 2016-07-01 NOTE — Telephone Encounter (Signed)
She can look on the web; I do not know myself. Sorry.   Dr. Brand Males, M.D., [facility]C.P Pulmonary and Critical Care Medicine Staff Physician [place] [facility] Pager: [phone], If no answer or between  15:00h - 7:00h: call [phone]  07/01/2016 10:12 AM

## 2016-07-02 NOTE — Telephone Encounter (Signed)
Order placed to [place] for [place]. Pt aware & voiced her understanding. Nothing further needed.

## 2016-07-02 NOTE — Telephone Encounter (Signed)
Ok to place that order via [facility] for innogen G3 or G4   Dr. Brand Males, M.D., [facility]C.P Pulmonary and Critical Care Medicine Staff Physician [place] [facility] Pager: [phone], If no answer or between  15:00h - 7:00h: call [phone]  07/02/2016 2:10 AM

## 2016-07-05 ENCOUNTER — Telehealth: Payer: Self-pay | Admitting: Internal Medicine

## 2016-07-05 NOTE — Telephone Encounter (Signed)
Called the number given and msg states call can not be completed as dialed  Will forward to [facility] so that they are aware incase they needed to know Huey Romans is not in contract with pt's ins

## 2016-07-06 ENCOUNTER — Telehealth: Payer: Self-pay | Admitting: Internal Medicine

## 2016-07-06 ENCOUNTER — Encounter (HOSPITAL_COMMUNITY): Payer: PPO

## 2016-07-06 NOTE — Telephone Encounter (Signed)
Changed dme to [facility] and sent orders to them Joellen [place]

## 2016-07-06 NOTE — Telephone Encounter (Signed)
Called and spoke with pt and she was on the phone with [facility] that this time.  She will call back if anything further needed.

## 2016-07-08 ENCOUNTER — Ambulatory Visit (INDEPENDENT_AMBULATORY_CARE_PROVIDER_SITE_OTHER): Payer: PPO | Admitting: Family

## 2016-07-08 ENCOUNTER — Encounter: Payer: Self-pay | Admitting: Family

## 2016-07-08 ENCOUNTER — Ambulatory Visit (HOSPITAL_COMMUNITY)
Admission: RE | Admit: 2016-07-08 | Discharge: 2016-07-08 | Disposition: A | Discharge status: Home / Self Care | Payer: PPO | Source: Ambulatory Visit | Attending: Family | Admitting: Family

## 2016-07-08 ENCOUNTER — Encounter (HOSPITAL_COMMUNITY): Payer: PPO

## 2016-07-08 VITALS — BP 140/71 | HR 84 | Temp 97.6°F | Resp 18 | Ht 64.5 in | Wt 224.0 lb

## 2016-07-08 DIAGNOSIS — Z8249 Family history of ischemic heart disease and other diseases of the circulatory system: Secondary | ICD-10-CM

## 2016-07-08 DIAGNOSIS — I714 Abdominal aortic aneurysm, without rupture, unspecified: Secondary | ICD-10-CM

## 2016-07-08 DIAGNOSIS — Z87891 Personal history of nicotine dependence: Secondary | ICD-10-CM | POA: Diagnosis not present

## 2016-07-08 NOTE — Progress Notes (Signed)
[facility] [place]   CC: Follow up Abdominal Aortic Aneurysm  History of Present Illness  PA COLMENERO is a 71 y.o. (1945-07-30) femalepatient that Dr. Oneida Alar has been monitoring for enlargement of the abdominal aorta. This was detected on a routine lumbar spine x-ray. Patient denies any symptoms of abdominal pain currently. Her grandmother had an abdominal aortic aneurysm. She does have a history of hypertension and hyperlipidemia. These are currently stable. She is a former smoker but quit in 2012.  She does have COPD and is on home oxygen at nighttime. She reports chronic lumbar spine pain, but no new back pain.  She wears a back brace.   Dr. Oneida Alar last evaluated pt on 06-05-15. At that time the maximum aortic diameter was 2.4 cm. We would only consider repair if this reaches a size of greater than 5-1/2 cm in diameter. He advised pt to follow-up with aortic ultrasound in 1 year. If there is no significant change that time we may consider spacing out her appointments further.  She returns today for follow up.   The patient denies claudication in legs with walking. The patient denies history of stroke or TIA symptoms.  Pt Diabetic: No Pt smoker: former smoker, quit in 2012, smoked x 50 years  Past Medical History:  Diagnosis Date  . Abdominal aneurysm ([place])    Dr. Oneida Alar follows lLOV 2 ''17 per pt "around 2 cm"  . Anemia    as a child  . Arthritis    left ankle, right knee, right SI joint, wrists, lower back  . COPD (chronic obstructive pulmonary disease) ([place])    ephysema-Dr. Chase Caller  . Dyspnea   . Headache    as a child would have terrible headaches during season changes  . 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 Hyperlipidemia   . Hypertension   . Multiple thyroid nodules   . Murmur, cardiac 1950  . Pneumonia   . Pre-diabetes   . Requires continuous at home supplemental oxygen    2 L 24/7  . Sebaceous cyst    hairline sebaceous cyst  left posterior neck to be excised 04-13-16 by Dr. Harlow Asa in [place] [facility]  . Sleep apnea    cpap used sometimes, uses oxygen concentrator 2 l/m nasally bedtime  . Varicella as child   Past Surgical History:  Procedure Laterality Date  . APPENDECTOMY     2008  . [place]  . COLONOSCOPY    . COLONOSCOPY WITH PROPOFOL N/A 04/16/2016   Procedure: COLONOSCOPY WITH PROPOFOL;  Surgeon: Carol Ada, MD;  Location: [facility];  Service: Endoscopy;  Laterality: N/A;  . CYST REMOVAL NECK Left 04/13/2016   Procedure: EXCISION OF SEBACEOUS CYST LEFT POSTERIOR NECK;  Surgeon: Armandina Gemma, MD;  Location: [place];  Service: General;  Laterality: Left;  . Excision of Pelvic Absess, Right Ovary     2008  . TUBAL LIGATION  1980   Social History Social History   Social History  . Marital status: Divorced    Spouse name: N/A  . Number of children: 1  . Years of education: 50   Occupational History  . counselor, [employer]   Social History Main Topics  . Smoking status: Former Smoker    Packs/day: 1.00    Years: 50.00    Types: Cigarettes    Quit date: 04/16/2011  . Smokeless tobacco: Never Used  Comment: quit that date when she had to go to ER   . Alcohol use 0.0 oz/week     Comment: rare occasion  . Drug use: No     Comment: no marijuana since 2012  . Sexual activity: No   Other Topics Concern  . Not on file   Social History Narrative   HSG; [name], [place]. . Married '69 - 9 yrs/divorced. 1 son - '72; no grandchildren.   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 No h/o physical or sexual abuse. ACP - no living will - wants information. Provided packet of information. On 08/05/2011: she stated she was distant cousins to celebrities [name] and [name]         Family History Family History  Problem Relation Age of Onset  . Heart disease Mother     MI - fatal  . Hypertension  Mother   . Stroke Father 82    fatal  . Alzheimer's disease Father   . Alzheimer's disease Brother   . Hyperlipidemia Brother   . Hypertension Brother   . Diabetes Brother   . Hypertension Brother   . Hyperlipidemia Brother     Current Outpatient Prescriptions on File Prior to Visit  Medication Sig Dispense Refill  . albuterol (VENTOLIN HFA) 108 (90 Base) MCG/ACT inhaler Inhale 1 puff into the lungs every 6 (six) hours as needed for wheezing. 18 each 5  . amLODipine (NORVASC) 10 MG tablet Take 1 tablet (10 mg total) by mouth daily. 90 tablet 3  . Aspirin-Calcium Carbonate (BAYER WOMENS) 81-300 MG TABS Take 1 tablet by mouth daily with breakfast.    . cholecalciferol (VITAMIN D) 1000 units tablet Take 2,000 Units by mouth daily.    . Fluticasone Furoate (ARNUITY ELLIPTA) 100 MCG/ACT AEPB Inhale 1 puff into the lungs daily. 30 each 11  . hydrochlorothiazide (HYDRODIURIL) 25 MG tablet Take 1 tablet (25 mg total) by mouth daily. 90 tablet 0  . ipratropium-albuterol (DUONEB) 0.5-2.5 (3) MG/3ML SOLN USE ONE VIAL IN NEBULIZER 4 TIMES DAILY 360 mL 5  . OXYGEN Inhale 2 L into the lungs. When exerting self    . sodium chloride (OCEAN) 0.65 % SOLN nasal spray Place 1 spray into both nostrils as needed (dryness).    . traMADol (ULTRAM) 50 MG tablet Take 1 tablet (50 mg total) by mouth every 6 (six) hours as needed. 75 tablet 2  . atorvastatin (LIPITOR) 40 MG tablet Take 1 tablet (40 mg total) by mouth daily. Takes daily AM (Patient not taking: Reported on 07/08/2016) 90 tablet 3   No current facility-administered medications on file prior to visit.    Allergies  Allergen Reactions  . Jamayah Myszka Rash    Severe rash to infection: pt is allergic to all metals other than sterling silver or gold jewelry.   [name] [Olmesartan] Swelling    Swelling of face and arms   . [name] [Valsartan] Swelling    Swelling of face and arms   . Lisinopril Cough  . Codeine Other (See Comments)    jittery  .  Hydrocodone-Acetaminophen Nausea And Vomiting  . Monosodium Glutamate Other (See Comments)    Facial swelling per pt  . Lead Acetate Rash    ROS: See HPI for pertinent positives and negatives.  Physical Examination  Vitals:   07/08/16 0855 07/08/16 0857  BP: (!) 154/72 140/71  Pulse: 86 84  Resp: 18   Temp: 97.6 F (36.4 C)   SpO2:  94%   Weight: 224 lb (101.6 kg)   Height: 5' 4.5" (1.638 m)    Body mass index is 37.86 kg/m.  General: A&O x 3, WD, obese female.  Pulmonary: Sym exp, respirations are non labored at rest, labored with little activity, limited air movement in all fields, no rales, rhonchi, or wheezing.  Cardiac: RRR, Nl S1, S2, no detected murmur.   Carotid Bruits Right Left   Negative Negative   Aorta is not palpable Radial pulses are 2+ palpable and =                          VASCULAR EXAM:                                                                                                         LE Pulses Right Left       FEMORAL  not palpable (obese)  2+ palpable        POPLITEAL  not palpable   not palpable       POSTERIOR TIBIAL  2+ palpable   2+ palpable        DORSALIS PEDIS      ANTERIOR TIBIAL not palpable  not palpable      Gastrointestinal: soft, NTND, -G/R, - HSM, - masses palpated, - CVAT B.  Musculoskeletal: M/S 5/5 throughout, Extremities without ischemic changes.  Neurologic: CN 2-12 intact, Pain and light touch intact in extremities are intact, Motor exam as listed above.  Non-Invasive Vascular Imaging  AAA Duplex (07/08/2016)  Previous size: 2.4 cm (Date: 05-01-15)  Current size:  2.4 cm (Date: 07-08-16). Bilateral common iliac artery diameters not documented, possibly not visualized.   Medical Decision Making  The patient is a 71 y.o. female who presents with asymptomatic AAA with no increase in size in a year, remains small at 2.4 cm, based on limited visualization due to abdominal gas and pt body habitus. Bilateral  pedal pulses are palpable.   Based on this patient's exam and diagnostic studies, the patient will follow up in 2 years  with the following studies: AAA duplex.  Consideration for repair of AAA would be made when the size is 5.5 cm, growth > 1 cm/yr, and symptomatic status.  I emphasized the importance of maximal medical management including strict control of blood pressure, blood glucose, and lipid levels, antiplatelet agents, obtaining regular exercise, and continued  cessation of smoking.   The patient was given information about AAA including signs, symptoms, treatment, and how to minimize the risk of enlargement and rupture of aneurysms.    The patient was advised to call 911 should the patient experience sudden onset abdominal or back pain.   Thank you for allowing [place] to participate in this patient's care.  Clemon Chambers, RN, MSN, FNP-C [facility] [place] Office: [facility] Physician: Oneida Alar  07/08/2016, 9:11 AM

## 2016-07-08 NOTE — Patient Instructions (Addendum)

## 2016-07-08 NOTE — Progress Notes (Signed)
Vitals:   07/08/16 0855  BP: (!) 154/72  Pulse: 86  Resp: 18  Temp: 97.6 F (36.4 C)  SpO2: 94%  Weight: 224 lb (101.6 kg)  Height: 5' 4.5" (1.638 m)

## 2016-07-12 ENCOUNTER — Other Ambulatory Visit: Payer: Self-pay | Admitting: Internal Medicine

## 2016-07-12 ENCOUNTER — Telehealth: Payer: Self-pay | Admitting: Internal Medicine

## 2016-07-12 ENCOUNTER — Telehealth: Payer: Self-pay

## 2016-07-12 DIAGNOSIS — M5440 Lumbago with sciatica, unspecified side: Secondary | ICD-10-CM

## 2016-07-12 MED ORDER — ALBUTEROL SULFATE 108 (90 BASE) MCG/ACT IN AEPB: 1.0000 | INHALATION_SPRAY | Freq: Four times a day (QID) | RESPIRATORY_TRACT | 5 refills | Status: DC | PRN

## 2016-07-12 MED ORDER — METHOCARBAMOL 500 MG PO TABS: 500.0000 mg | ORAL_TABLET | Freq: Four times a day (QID) | ORAL | 2 refills | Status: DC | PRN

## 2016-07-12 NOTE — Telephone Encounter (Signed)
[facility] faxed request to change ventolin to proair due to change in insurance coverage.   Please advise.

## 2016-07-12 NOTE — Telephone Encounter (Signed)
Try pro-air respiclick  Dr. Brand Males, M.D., [facility].C.P Pulmonary and Critical Care Medicine Staff Physician [place] [facility] Pager: [phone], If no answer or between  15:00h - 7:00h: call [phone]  07/12/2016 2:40 PM

## 2016-07-12 NOTE — Telephone Encounter (Signed)
RX sent

## 2016-07-12 NOTE — Telephone Encounter (Signed)
Pt informed rx was sent in.  

## 2016-07-12 NOTE — Telephone Encounter (Signed)
Patient states she has seen Dr. Ronnald Ramp for back pain.  States muscle spasms started yesterday and would like to know if Dr. Ronnald Ramp could prescribe something for spasms?

## 2016-07-12 NOTE — Telephone Encounter (Signed)
Patient asked that this be filled today.

## 2016-07-12 NOTE — Telephone Encounter (Signed)
MR  Pt states her ventolin inhaler is going to cost her $63 and wanted to know we can send in a different type of rescue inhaler.

## 2016-07-12 NOTE — Telephone Encounter (Signed)
Pharmacy called stated that Robaxin need to have PA start on   BIN [id number] PCN Part D ID  [id number] Group [id number]  Or she can pay out of pocketed $41.36  Please call her once its done, she really need this med today.

## 2016-07-12 NOTE — Telephone Encounter (Signed)
Pt aware of recs.  rx sent to preferred pharmacy.  Pt will call back if she has any troubles filling this rx.  Nothing further needed.

## 2016-07-12 NOTE — Telephone Encounter (Signed)
Rx change sent in.

## 2016-07-13 ENCOUNTER — Encounter (HOSPITAL_COMMUNITY): Payer: PPO

## 2016-07-13 ENCOUNTER — Telehealth: Payer: Self-pay | Admitting: Internal Medicine

## 2016-07-13 DIAGNOSIS — J449 Chronic obstructive pulmonary disease, unspecified: Secondary | ICD-10-CM

## 2016-07-13 NOTE — Telephone Encounter (Signed)
PA started [employer]

## 2016-07-13 NOTE — Addendum Note (Signed)
Addended by: Lianne Cure A on: 07/13/2016 11:22 AM   Modules accepted: Orders

## 2016-07-13 NOTE — Telephone Encounter (Signed)
Spoke with Leafy Ro at [place]. [facility] does not carry Inogen systems. They can supply her with a Simply Go Mini though. Spoke with pt. States that she is fine with whatever they supply her with, she is does not care what the brand name is. I called Mandy back at [place]. She is aware of the above information. A new order had to be placed. Nothing further was needed.

## 2016-07-14 ENCOUNTER — Emergency Department (HOSPITAL_COMMUNITY)
Admission: EM | Admit: 2016-07-14 | Discharge: 2016-07-14 | Disposition: A | Discharge status: Home / Self Care | Payer: PPO | Attending: Emergency Medicine | Admitting: Emergency Medicine

## 2016-07-14 ENCOUNTER — Encounter (HOSPITAL_COMMUNITY): Payer: Self-pay

## 2016-07-14 DIAGNOSIS — M62830 Muscle spasm of back: Secondary | ICD-10-CM | POA: Diagnosis not present

## 2016-07-14 DIAGNOSIS — Z7982 Long term (current) use of aspirin: Secondary | ICD-10-CM | POA: Insufficient documentation

## 2016-07-14 DIAGNOSIS — M545 Low back pain, unspecified: Secondary | ICD-10-CM

## 2016-07-14 DIAGNOSIS — Z87891 Personal history of nicotine dependence: Secondary | ICD-10-CM | POA: Diagnosis not present

## 2016-07-14 DIAGNOSIS — I10 Essential (primary) hypertension: Secondary | ICD-10-CM | POA: Insufficient documentation

## 2016-07-14 DIAGNOSIS — E119 Type 2 diabetes mellitus without complications: Secondary | ICD-10-CM | POA: Diagnosis not present

## 2016-07-14 DIAGNOSIS — R0602 Shortness of breath: Secondary | ICD-10-CM | POA: Diagnosis not present

## 2016-07-14 DIAGNOSIS — J449 Chronic obstructive pulmonary disease, unspecified: Secondary | ICD-10-CM | POA: Diagnosis not present

## 2016-07-14 MED ORDER — DICLOFENAC SODIUM 3 % TD GEL
1.0000 | Freq: Two times a day (BID) | TRANSDERMAL | 0 refills | Status: DC
Start: 2016-07-14 — End: 2017-01-13

## 2016-07-14 MED ORDER — BUPIVACAINE HCL (PF) 0.5 % IJ SOLN
10.0000 mL | Freq: Once | INTRAMUSCULAR | Status: AC
  Administered 2016-07-14: 10 mL
  Filled 2016-07-14: qty 30

## 2016-07-14 NOTE — ED Notes (Signed)
Family at bedside. 

## 2016-07-14 NOTE — ED Notes (Signed)
ED Provider at bedside. 

## 2016-07-14 NOTE — ED Triage Notes (Signed)
Patient c/o bilateral lowe back pain R> L x 8 days ago. Patient states she has arthritis in her back. Patient states she her PCP and was given hydrocodone, but med made her nauseated and then was placed on Tramadol. Patient continues to have  Pain.

## 2016-07-14 NOTE — ED Provider Notes (Signed)
Victoria [name] Provider Note   CSN: [id number] Arrival date & time: 07/14/16  0919     History   Chief Complaint Chief Complaint  Patient presents with  . Back Pain    HPI Victoria [name] is a 71 y.o. female.  NAIOMY [name] is a 71 y.o. Female who presents to the ED complaining of worsening right low back pain for the past 8 days. Patient reports her back pain began when lifting an 18 pound oxygen condenser. She reports pain in her right low back that is worse with movement. She was seen by her primary care doctor and prescribed hydrocodone which made her feel ill. She now has been taking tramadol and a muscle relaxer without much relief. She denies any back injury or fall. Denies history of cancer or IV drug use. No recent rapid changes to her weight. She denies fevers, numbness, tingling, weakness, urinary symptoms, loss of bladder control, loss of bowel control, abdominal pain, nausea, vomiting or rashes.   The history is provided by the patient and medical records. No language interpreter was used.  Back Pain   Pertinent negatives include no chest pain, no fever, no numbness, no headaches, no abdominal pain, no dysuria and no weakness.    Past Medical History:  Diagnosis Date  . Abdominal aneurysm ([place])    Dr. Oneida Alar follows lLOV 2 ''17 per pt "around 2 cm"  . Anemia    as a child  . Arthritis    left ankle, right knee, right SI joint, wrists, lower back  . COPD (chronic obstructive pulmonary disease) ([place])    ephysema-Dr. Chase Caller  . Dyspnea   . Headache    as a child would have terrible headaches during season changes  . 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 Hyperlipidemia   . Hypertension   . Multiple thyroid nodules   . Murmur, cardiac 1950  . Pneumonia   . Pre-diabetes   . Requires continuous at home supplemental oxygen    2 L 24/7  . Sebaceous cyst    hairline sebaceous cyst left posterior neck to be excised 04-13-16 by Dr. Harlow Asa in [place]  hospital  . Sleep apnea    cpap used sometimes, uses oxygen concentrator 2 l/m nasally bedtime  . Varicella as child    Patient Active Problem List   Diagnosis Date Noted  . Acute right-sided low back pain with sciatica 06/23/2016  . Type II diabetes mellitus with manifestations ([place]) 01/08/2016  . Visit for screening mammogram 10/14/2015  . Atherosclerosis of native coronary artery of native heart without angina pectoris 10/14/2015  . History of positive PPD 10/08/2015  . COPD, severe ([place]) 09/04/2015  . Morbid obesity ([place]) 09/04/2015  . AAA (abdominal aortic aneurysm) without rupture ([place]) 05/13/2015  . Nontoxic multinodular goiter 05/13/2015  . OSA (obstructive sleep apnea) 11/15/2014  . Essential hypertension 03/11/2014  . Osteoporosis 02/11/2014  . Routine health maintenance 12/07/2011  . Obesity (BMI 30.0-34.9) 08/05/2011  . DJD (degenerative joint disease) of knee   . Hyperlipidemia with target LDL less than 100     Past Surgical History:  Procedure Laterality Date  . APPENDECTOMY     2008  . [place]  . COLONOSCOPY    . COLONOSCOPY WITH PROPOFOL N/A 04/16/2016   Procedure: COLONOSCOPY WITH PROPOFOL;  Surgeon: Carol Ada, MD;  Location: [facility];  Service: Endoscopy;  Laterality: N/A;  . CYST REMOVAL NECK Left  04/13/2016   Procedure: EXCISION OF SEBACEOUS CYST LEFT POSTERIOR NECK;  Surgeon: Armandina Gemma, MD;  Location: [facility];  Service: General;  Laterality: Left;  . Excision of Pelvic Absess, Right Ovary     2008  . TUBAL LIGATION  1980    OB History    No data available       Home Medications    Prior to Admission medications   Medication Sig Start Date End Date Taking? Authorizing Provider  Albuterol Sulfate (PROAIR RESPICLICK) [id number] (90 Base) MCG/ACT AEPB Inhale 1-2 puffs into the lungs every 6 (six) hours as needed. 07/12/16   Brand Males, MD  amLODipine (NORVASC) 10 MG tablet Take 1 tablet (10 mg total) by mouth daily. 06/23/16    Janith Lima, MD  Aspirin-Calcium Carbonate (BAYER WOMENS) 81-300 MG TABS Take 1 tablet by mouth daily with breakfast.    Historical Provider, MD  atorvastatin (LIPITOR) 40 MG tablet Take 1 tablet (40 mg total) by mouth daily. Takes daily AM Patient not taking: Reported on 07/08/2016 06/23/16   Janith Lima, MD  cholecalciferol (VITAMIN D) 1000 units tablet Take 2,000 Units by mouth daily.    Historical Provider, MD  Diclofenac Sodium 3 % GEL Place 1 application onto the skin 2 (two) times daily. To affected area. 07/14/16   Waynetta Pean, PA-C  Fluticasone Furoate (ARNUITY ELLIPTA) 100 MCG/ACT AEPB Inhale 1 puff into the lungs daily. 06/23/16   Janith Lima, MD  hydrochlorothiazide (HYDRODIURIL) 25 MG tablet Take 1 tablet (25 mg total) by mouth daily. 05/15/16   Janith Lima, MD  ipratropium-albuterol (DUONEB) 0.5-2.5 (3) MG/3ML SOLN USE ONE VIAL IN NEBULIZER 4 TIMES DAILY 01/13/16   Brand Males, MD  methocarbamol (ROBAXIN) 500 MG tablet Take 1 tablet (500 mg total) by mouth every 6 (six) hours as needed for muscle spasms. 07/12/16   Janith Lima, MD  OXYGEN Inhale 2 L into the lungs. When exerting self    Historical Provider, MD  sodium chloride (OCEAN) 0.65 % SOLN nasal spray Place 1 spray into both nostrils as needed (dryness).    Historical Provider, MD  traMADol (ULTRAM) 50 MG tablet Take 1 tablet (50 mg total) by mouth every 6 (six) hours as needed. 06/29/16   Janith Lima, MD    Family History Family History  Problem Relation Age of Onset  . Heart disease Mother     MI - fatal  . Hypertension Mother   . Stroke Father 85    fatal  . Alzheimer's disease Father   . Alzheimer's disease Brother   . Hyperlipidemia Brother   . Hypertension Brother   . Diabetes Brother   . Hypertension Brother   . Hyperlipidemia Brother     Social History Social History  Substance Use Topics  . Smoking status: Former Smoker    Packs/day: 1.00    Years: 50.00    Types: Cigarettes     Quit date: 04/16/2011  . Smokeless tobacco: Never Used     Comment: quit that date when she had to go to ER   . Alcohol use 0.0 oz/week     Comment: rare occasion     Allergies   Nickel; Benicar [olmesartan]; [name] [valsartan]; Lisinopril; Codeine; Hydrocodone-acetaminophen; Monosodium glutamate; and Lead acetate   Review of Systems Review of Systems  Constitutional: Negative for fever.  Respiratory: Negative for cough and shortness of breath.   Cardiovascular: Negative for chest pain and leg swelling.  Gastrointestinal: Negative for abdominal  pain, diarrhea, nausea and vomiting.  Genitourinary: Negative for difficulty urinating, dysuria, flank pain, frequency, hematuria and urgency.  Musculoskeletal: Positive for back pain. Negative for neck pain.  Skin: Negative for rash.  Neurological: Negative for weakness, light-headedness, numbness and headaches.     Physical Exam Updated Vital Signs BP 153/73 (BP Location: Left Arm)   Pulse 85   Temp 97 F (36.1 C) (Oral)   Resp 18   Ht 5' 4.5" (1.638 m)   Wt 101.6 kg   BMI 37.86 kg/m   Physical Exam  Constitutional: She appears well-developed and well-nourished. No distress.  HENT:  Head: Normocephalic and atraumatic.  Eyes: Conjunctivae are normal. Pupils are equal, round, and reactive to light. Right eye exhibits no discharge. Left eye exhibits no discharge.  Neck: Neck supple.  Cardiovascular: Normal rate, regular rhythm, normal heart sounds and intact distal pulses.   Pulmonary/Chest: Effort normal and breath sounds normal. No respiratory distress.  Abdominal: Soft. There is no tenderness. There is no guarding.  Musculoskeletal: Normal range of motion. She exhibits tenderness. She exhibits no edema or deformity.  Tenderness overlying her right low back musculature. Pinpoint tenderness noted. No overlying skin changes to her back. No midline back tenderness. Good strength in her bilateral lower extremities. No lower  extremity edema or tenderness.  Lymphadenopathy:    She has no cervical adenopathy.  Neurological: She is alert. She displays normal reflexes. No sensory deficit. Coordination normal.  Bilateral patellar DTRs are intact. Normal gait. Sensation is intact her bilateral lower extremities.  Skin: Skin is warm and dry. Capillary refill takes less than 2 seconds. No rash noted. She is not diaphoretic. No erythema. No pallor.  Psychiatric: She has a normal mood and affect. Her behavior is normal.  Nursing note and vitals reviewed.    ED Treatments / Results  Labs (all labs ordered are listed, but only abnormal results are displayed) Labs Reviewed - No data to display  EKG  EKG Interpretation None       Radiology No results found.  Procedures Procedures (including critical care time)  Medications Ordered in ED Medications  bupivacaine (MARCAINE) 0.5 % injection 10 mL (10 mLs Infiltration Given 07/14/16 1004)     Initial Impression / Assessment and Plan / ED Course  I have reviewed the triage vital signs and the nursing notes.  Pertinent labs & imaging results that were available during my care of the patient were reviewed by me and considered in my medical decision making (see chart for details).    This is a 71 y.o. Female who presents to the ED complaining of worsening right low back pain for the past 8 days. Patient reports her back pain began when lifting an 18 pound oxygen condenser. She reports pain in her right low back that is worse with movement. She was seen by her primary care doctor and prescribed hydrocodone which made her feel ill. She now has been taking tramadol and a muscle relaxer without much relief. She denies any back injury or fall. Patient with right low back pain.  No neurological deficits and normal neuro exam.  Patient can walk but states is painful.  No loss of bowel or bladder control.  No concern for cauda equina.  No fever, night sweats, weight loss, h/o  cancer, IVDU.  Dr. Laneta Simmers performed a trigger point injection with marcaine with relief for the patient. She tolerated the procedure well. I educated on back exercises.) Show was also prescribed. I discussed return precautions.  I advised the patient to follow-up with their primary care provider this week. I advised the patient to return to the emergency department with new or worsening symptoms or new concerns. The patient verbalized understanding and agreement with plan.      Final Clinical Impressions(s) / ED Diagnoses   Final diagnoses:  Acute right-sided low back pain without sciatica  Muscle spasm of back    New Prescriptions New Prescriptions   DICLOFENAC SODIUM 3 % GEL    Place 1 application onto the skin 2 (two) times daily. To affected area.     Waynetta Pean, PA-C 07/14/16 1012    Leo Grosser, MD 07/14/16 1024    Leo Grosser, MD 07/14/16 [phone]

## 2016-07-15 ENCOUNTER — Encounter (HOSPITAL_COMMUNITY): Payer: PPO

## 2016-07-15 ENCOUNTER — Telehealth: Payer: Self-pay | Admitting: Internal Medicine

## 2016-07-15 NOTE — Telephone Encounter (Signed)
Pt called in and said that pharmacy is sending forms for PA for Diclofenac Sodium 3%.  Dr from hospital sent this in for her but it needs a PA.  She was for her back spasm that she went to the hospital for

## 2016-07-16 NOTE — Telephone Encounter (Addendum)
PA started via cover my meds Key: [id number]

## 2016-07-16 NOTE — Telephone Encounter (Signed)
Patient is calling again about this. Wanting to know if there is any updates. She also wanted to know if we had samples. I informed her the PA takes some time. And Dr. Ronnald Ramp is out of office. As far as sample we really dont really have them but we could check. Please follow up, Thank you.

## 2016-07-19 ENCOUNTER — Inpatient Hospital Stay: Payer: PPO | Admitting: Internal Medicine

## 2016-07-20 ENCOUNTER — Encounter (HOSPITAL_COMMUNITY): Payer: PPO

## 2016-07-20 MED ORDER — DICLOFENAC SODIUM 1.5 % TD SOLN: 1.0000 mL | Freq: Once | TRANSDERMAL | 1 refills | Status: DC | PRN

## 2016-07-20 NOTE — Telephone Encounter (Signed)
Patient states that she spoke with [facility].  States number that called was [phone].  They told patient they can not ship medication from [place] to her.  Please follow up in regard.

## 2016-07-20 NOTE — Telephone Encounter (Addendum)
Also, would pennsaid be an alternative for patient and sending it to [facility].  Rx sent.

## 2016-07-20 NOTE — Telephone Encounter (Signed)
Yes, you can try that

## 2016-07-20 NOTE — Telephone Encounter (Signed)
Can not get the methocarbomal or the diclofenac sodium gel covered by her insurance.   The formulary is:  Celecoxib Ibuprofen Naproxen nabumetone

## 2016-07-20 NOTE — Telephone Encounter (Addendum)
Alternatives for Methocarbamol?   The formulary is:  Celecoxib Ibuprofen  Naproxen nabumetone

## 2016-07-21 ENCOUNTER — Inpatient Hospital Stay: Payer: PPO | Admitting: Internal Medicine

## 2016-07-22 ENCOUNTER — Encounter (HOSPITAL_COMMUNITY): Payer: PPO

## 2016-07-27 ENCOUNTER — Encounter (HOSPITAL_COMMUNITY): Payer: PPO

## 2016-07-29 ENCOUNTER — Encounter (HOSPITAL_COMMUNITY): Payer: PPO

## 2016-08-03 ENCOUNTER — Encounter (HOSPITAL_COMMUNITY): Payer: PPO

## 2016-08-05 ENCOUNTER — Encounter (HOSPITAL_COMMUNITY): Payer: PPO

## 2016-08-10 ENCOUNTER — Encounter (HOSPITAL_COMMUNITY): Payer: PPO

## 2016-08-11 DIAGNOSIS — J449 Chronic obstructive pulmonary disease, unspecified: Secondary | ICD-10-CM | POA: Diagnosis not present

## 2016-08-12 ENCOUNTER — Encounter (HOSPITAL_COMMUNITY): Payer: PPO

## 2016-08-17 ENCOUNTER — Encounter (HOSPITAL_COMMUNITY): Payer: PPO

## 2016-08-19 ENCOUNTER — Encounter (HOSPITAL_COMMUNITY): Payer: PPO

## 2016-08-19 ENCOUNTER — Other Ambulatory Visit: Payer: Self-pay | Admitting: Internal Medicine

## 2016-08-19 DIAGNOSIS — I10 Essential (primary) hypertension: Secondary | ICD-10-CM

## 2016-08-24 ENCOUNTER — Encounter (HOSPITAL_COMMUNITY): Payer: PPO

## 2016-08-26 ENCOUNTER — Encounter (HOSPITAL_COMMUNITY): Payer: PPO

## 2016-08-31 ENCOUNTER — Encounter (HOSPITAL_COMMUNITY): Payer: PPO

## 2016-09-02 ENCOUNTER — Encounter (HOSPITAL_COMMUNITY): Payer: PPO

## 2016-09-10 ENCOUNTER — Telehealth: Payer: Self-pay

## 2016-09-10 NOTE — Telephone Encounter (Signed)
Routing to dr Ronnald Ramp, patient's bone density tscore from 2015 shows 2.9--and osteoporosis diagnosis on chart---are you ok with patient starting prolia injections---please advise, I call patient to discuss, thanks

## 2016-09-11 DIAGNOSIS — J449 Chronic obstructive pulmonary disease, unspecified: Secondary | ICD-10-CM | POA: Diagnosis not present

## 2016-09-11 NOTE — Telephone Encounter (Signed)
yes

## 2016-09-13 NOTE — Telephone Encounter (Signed)
[name] insurance will be submitted for summary of benefits to start prolia injections---Victoria [name] will call patient to discuss after findings are received

## 2016-10-11 DIAGNOSIS — J449 Chronic obstructive pulmonary disease, unspecified: Secondary | ICD-10-CM | POA: Diagnosis not present

## 2016-10-12 ENCOUNTER — Ambulatory Visit (INDEPENDENT_AMBULATORY_CARE_PROVIDER_SITE_OTHER)
Admission: RE | Admit: 2016-10-12 | Discharge: 2016-10-12 | Disposition: A | Discharge status: Home / Self Care | Payer: PPO | Source: Ambulatory Visit | Attending: Acute Care | Admitting: Acute Care

## 2016-10-12 DIAGNOSIS — Z87891 Personal history of nicotine dependence: Secondary | ICD-10-CM

## 2016-10-20 ENCOUNTER — Other Ambulatory Visit: Payer: Self-pay | Admitting: Internal Medicine

## 2016-10-20 ENCOUNTER — Telehealth: Payer: Self-pay | Admitting: Acute Care

## 2016-10-20 DIAGNOSIS — M810 Age-related osteoporosis without current pathological fracture: Secondary | ICD-10-CM

## 2016-10-20 DIAGNOSIS — E2839 Other primary ovarian failure: Secondary | ICD-10-CM | POA: Insufficient documentation

## 2016-10-20 DIAGNOSIS — Z87891 Personal history of nicotine dependence: Secondary | ICD-10-CM

## 2016-10-20 NOTE — Telephone Encounter (Signed)
Patient advised that bone density dept would call her to schedule appt

## 2016-10-20 NOTE — Telephone Encounter (Signed)
Patient is probably going to start prolia injections, but would like to have another bone density test done first----routing to dr Ronnald Ramp, can you please order bone density test for patient to see if her tscore shows any improvement since 2015---please advise, thanks

## 2016-10-20 NOTE — Telephone Encounter (Signed)
Notes recorded by Magdalen Spatz, NP on 10/19/2016 at 4:23 PM EDT Please call patient and let her know that her low-dose screening CT was read as a Lung RADS 2: nodules that are benign in appearance and behavior with a very low likelihood of becoming a clinically active cancer due to size or lack of growth. Recommendation per radiology is for a repeat LDCT in 12 months. Let her know we will order and schedule her annual screening for May 2019. Please let her know that there was notation of coronary atherosclerosis. She is currently on a statin for treatment. Please remind the patient that this is a non-gated exam therefore degree or severity of disease cannot be determined. Please have him follow up with his PCP regarding potential risk factor modification, dietary therapy or pharmacologic therapy if clinically indicated. Please place order for annual screening scan for May 2019, and fax results to PCP. Thank you so much  Spoke with patient about above results. She verbalized understanding. Had no further questions. States she will follow up with Dr. Ronnald Ramp about her cholesterol since she is not taking the Lipitor 40mg . Nothing else was needed at time of call. Will place order for her screening for May 2019.

## 2016-10-20 NOTE — Telephone Encounter (Signed)
ordered

## 2016-10-21 ENCOUNTER — Other Ambulatory Visit: Payer: Self-pay | Admitting: Acute Care

## 2016-11-03 ENCOUNTER — Telehealth: Payer: Self-pay | Admitting: Internal Medicine

## 2016-11-03 NOTE — Telephone Encounter (Signed)
Pt called in and said that she needs a refill on her muscle relaxer.  She was unsure of the name of it.    Best number [phone]  Pharmacy [facility] [address]

## 2016-11-04 ENCOUNTER — Other Ambulatory Visit: Payer: Self-pay | Admitting: Internal Medicine

## 2016-11-04 DIAGNOSIS — M5440 Lumbago with sciatica, unspecified side: Secondary | ICD-10-CM

## 2016-11-04 MED ORDER — METHOCARBAMOL 500 MG PO TABS: 500.0000 mg | ORAL_TABLET | Freq: Four times a day (QID) | ORAL | 2 refills | Status: DC | PRN

## 2016-11-11 ENCOUNTER — Telehealth: Payer: Self-pay

## 2016-11-11 DIAGNOSIS — J449 Chronic obstructive pulmonary disease, unspecified: Secondary | ICD-10-CM | POA: Diagnosis not present

## 2016-11-11 MED ORDER — TIZANIDINE HCL 4 MG PO TABS
4.0000 mg | ORAL_TABLET | Freq: Four times a day (QID) | ORAL | 1 refills | Status: DC | PRN
Start: 2016-11-11 — End: 2017-01-13

## 2016-11-11 NOTE — Telephone Encounter (Signed)
[place] for change to zanaflex - done erx

## 2016-11-11 NOTE — Addendum Note (Signed)
Addended by: Biagio Borg on: 11/11/2016 04:10 PM   Modules accepted: Orders

## 2016-11-11 NOTE — Telephone Encounter (Signed)
Please send in alternative to Robaxin, its on back order at pharmacy. Please advise in Dr.Lozito absence

## 2016-12-02 ENCOUNTER — Telehealth: Payer: Self-pay

## 2016-12-02 NOTE — Telephone Encounter (Signed)
Patient has an appointment on July 25 @ 1030am.

## 2016-12-02 NOTE — Telephone Encounter (Signed)
Victoria [name] will check bone density results and call patient back to discuss prolia

## 2016-12-02 NOTE — Telephone Encounter (Signed)
LVM to inform patient to call office to set up an bone density app.

## 2016-12-02 NOTE — Telephone Encounter (Signed)
[name] insurance has been verified for prolia, with estimated $220 copay---patient would like to schedule bone density test first and then make decision on prolia injections---request has been given for scheduling to contact patient to schedule bone density appt

## 2016-12-08 ENCOUNTER — Ambulatory Visit (INDEPENDENT_AMBULATORY_CARE_PROVIDER_SITE_OTHER)
Admission: RE | Admit: 2016-12-08 | Discharge: 2016-12-08 | Disposition: A | Discharge status: Home / Self Care | Payer: PPO | Source: Ambulatory Visit | Attending: Internal Medicine | Admitting: Internal Medicine

## 2016-12-08 DIAGNOSIS — E2839 Other primary ovarian failure: Secondary | ICD-10-CM | POA: Diagnosis not present

## 2016-12-08 DIAGNOSIS — M810 Age-related osteoporosis without current pathological fracture: Secondary | ICD-10-CM

## 2016-12-11 DIAGNOSIS — J449 Chronic obstructive pulmonary disease, unspecified: Secondary | ICD-10-CM | POA: Diagnosis not present

## 2016-12-12 LAB — HM DEXA SCAN: HM Dexa Scan: -2.4

## 2016-12-16 ENCOUNTER — Telehealth: Payer: Self-pay

## 2016-12-16 NOTE — Telephone Encounter (Signed)
Bone density score has been obtained from test on 7/25---score is 2.4---per dr Ronnald Ramp, he would still like patient to consider prolia injections---patient also has had previous fx in foot (toe) back around 1980's---insurance has been verified and patient has estimated $220 copay---patient is calling [employer] to see if she can get assistance with copay cost---patient will call Jaimin Krupka back to advise if she has qualified

## 2016-12-16 NOTE — Telephone Encounter (Signed)
Pt called back to let you know that she was approved.  Effective 12/16/16 - 10/16/2017 ID# [id number] Group# [id number] BIN# [id number] PCN# [id number]

## 2016-12-24 NOTE — Telephone Encounter (Signed)
Bone Density Results given. Pt is not a candidate for Prolia.

## 2016-12-30 NOTE — Telephone Encounter (Signed)
Jonelle Sidle will be getting with margaret to see how we proceed with this funding and will call patient back---I should be meeting with margaret week of 01/03/17

## 2016-12-31 ENCOUNTER — Ambulatory Visit: Payer: PPO | Admitting: *Deleted

## 2016-12-31 NOTE — Telephone Encounter (Signed)
Patient called back stating that she has not received anything in regard from [employer]

## 2017-01-03 NOTE — Telephone Encounter (Addendum)
Per margaret/Prolia rep---this assistance program will be able to cover cost if rx is sent to specialty pharmacy and patient picks up rx and brings to our office for administration---Victoria [name] will be coordinating rx to [facility] and patient's visit to get administration---appt made on august 30,2018

## 2017-01-04 NOTE — Telephone Encounter (Signed)
Per [facility] pharmacy, rx needs to be sent in on 8/27, will ship and arrive either next day or within next 2 days---med can then be picked up by patient and insurance will be run thru specialty pharmacy---per margaret/prolia, patient's pharm benefits should cover entire cost of med at pharmacy pick up----patient should bring med to [facility] office on 8/30 for her office visit

## 2017-01-10 ENCOUNTER — Telehealth: Payer: Self-pay

## 2017-01-10 DIAGNOSIS — M810 Age-related osteoporosis without current pathological fracture: Secondary | ICD-10-CM

## 2017-01-10 MED ORDER — DENOSUMAB 60 MG/ML ~~LOC~~ SOLN: 60.0000 mg | Freq: Once | SUBCUTANEOUS | 0 refills | Status: AC

## 2017-01-10 NOTE — Telephone Encounter (Signed)
prolia has been called into [facility]---patient advised---patient understands that [facility] will fill prescription thru their specialty pharmacy---patient will pick up at [place] and bring to our office for administration---patient advised to keep refrigerated if she is not coming over right away with prolia

## 2017-01-11 DIAGNOSIS — J449 Chronic obstructive pulmonary disease, unspecified: Secondary | ICD-10-CM | POA: Diagnosis not present

## 2017-01-11 NOTE — Telephone Encounter (Signed)
error 

## 2017-01-13 ENCOUNTER — Other Ambulatory Visit (INDEPENDENT_AMBULATORY_CARE_PROVIDER_SITE_OTHER): Payer: PPO

## 2017-01-13 ENCOUNTER — Ambulatory Visit: Payer: PPO | Admitting: Internal Medicine

## 2017-01-13 ENCOUNTER — Encounter: Payer: Self-pay | Admitting: Internal Medicine

## 2017-01-13 ENCOUNTER — Ambulatory Visit (INDEPENDENT_AMBULATORY_CARE_PROVIDER_SITE_OTHER): Payer: PPO | Admitting: Internal Medicine

## 2017-01-13 VITALS — BP 150/60 | HR 91 | Temp 98.3°F | Resp 16 | Ht 64.5 in | Wt 212.0 lb

## 2017-01-13 DIAGNOSIS — E785 Hyperlipidemia, unspecified: Secondary | ICD-10-CM | POA: Diagnosis not present

## 2017-01-13 DIAGNOSIS — E118 Type 2 diabetes mellitus with unspecified complications: Secondary | ICD-10-CM

## 2017-01-13 DIAGNOSIS — M810 Age-related osteoporosis without current pathological fracture: Secondary | ICD-10-CM

## 2017-01-13 DIAGNOSIS — M5136 Other intervertebral disc degeneration, lumbar region: Secondary | ICD-10-CM | POA: Insufficient documentation

## 2017-01-13 DIAGNOSIS — E042 Nontoxic multinodular goiter: Secondary | ICD-10-CM | POA: Diagnosis not present

## 2017-01-13 DIAGNOSIS — Z23 Encounter for immunization: Secondary | ICD-10-CM

## 2017-01-13 LAB — CBC WITH DIFFERENTIAL/PLATELET
BASOS ABS: 0.1 10*3/uL (ref 0.0–0.1)
Basophils Relative: 0.8 % (ref 0.0–3.0)
EOS ABS: 0.2 10*3/uL (ref 0.0–0.7)
Eosinophils Relative: 1.6 % (ref 0.0–5.0)
HCT: 38.9 % (ref 36.0–46.0)
Hemoglobin: 12.9 g/dL (ref 12.0–15.0)
LYMPHS ABS: 2.1 10*3/uL (ref 0.7–4.0)
Lymphocytes Relative: 17.7 % (ref 12.0–46.0)
MCHC: 33.1 g/dL (ref 30.0–36.0)
MCV: 84.4 fl (ref 78.0–100.0)
MONO ABS: 0.8 10*3/uL (ref 0.1–1.0)
Monocytes Relative: 6.6 % (ref 3.0–12.0)
NEUTROS PCT: 73.3 % (ref 43.0–77.0)
Neutro Abs: 8.7 10*3/uL — ABNORMAL HIGH (ref 1.4–7.7)
Platelets: 260 10*3/uL (ref 150.0–400.0)
RBC: 4.6 Mil/uL (ref 3.87–5.11)
RDW: 14.8 % (ref 11.5–15.5)
WBC: 11.8 10*3/uL — ABNORMAL HIGH (ref 4.0–10.5)

## 2017-01-13 LAB — POCT GLYCOSYLATED HEMOGLOBIN (HGB A1C): Hemoglobin A1C: 6.9

## 2017-01-13 MED ORDER — DENOSUMAB 60 MG/ML ~~LOC~~ SOLN
60.0000 mg | Freq: Once | SUBCUTANEOUS | Status: AC
  Administered 2017-01-13: 60 mg via SUBCUTANEOUS

## 2017-01-13 MED ORDER — TIZANIDINE HCL 4 MG PO TABS: 4.0000 mg | ORAL_TABLET | Freq: Three times a day (TID) | ORAL | 5 refills | Status: DC | PRN

## 2017-01-13 NOTE — Patient Instructions (Signed)

## 2017-01-13 NOTE — Progress Notes (Signed)
Subjective:  Patient ID: Victoria [name], female    DOB: 06/12/1945  Age: 71 y.o. MRN: [id number]  CC: Hyperlipidemia; Diabetes; Back Pain; and Osteoarthritis   HPI Victoria [name] presents for f/up - She complains of chronic, nonradiating low back pain and wants a refill of her muscle relaxer. She says that in addition to Motrin it helps control her pain. She also complains of arthritis pain in her large joints and she uses a topical nsaid to treat that. She otherwise feels well and offers no complaints today.  Outpatient Medications Prior to Visit  Medication Sig Dispense Refill  . Albuterol Sulfate (PROAIR RESPICLICK) 607 (90 Base) MCG/ACT AEPB Inhale 1-2 puffs into the lungs every 6 (six) hours as needed. 1 each 5  . amLODipine (NORVASC) 10 MG tablet Take 1 tablet (10 mg total) by mouth daily. 90 tablet 3  . Aspirin-Calcium Carbonate (BAYER WOMENS) 81-300 MG TABS Take 1 tablet by mouth daily with breakfast.    . atorvastatin (LIPITOR) 40 MG tablet Take 1 tablet (40 mg total) by mouth daily. Takes daily AM 90 tablet 3  . cholecalciferol (VITAMIN D) 1000 units tablet Take 2,000 Units by mouth daily.    . Fluticasone Furoate (ARNUITY ELLIPTA) 100 MCG/ACT AEPB Inhale 1 puff into the lungs daily. 30 each 11  . hydrochlorothiazide (HYDRODIURIL) 25 MG tablet TAKE ONE TABLET BY MOUTH ONCE DAILY 90 tablet 1  . ipratropium-albuterol (DUONEB) 0.5-2.5 (3) MG/3ML SOLN USE ONE VIAL IN NEBULIZER 4 TIMES DAILY 360 mL 5  . OXYGEN Inhale 2 L into the lungs. When exerting self    . sodium chloride (OCEAN) 0.65 % SOLN nasal spray Place 1 spray into both nostrils as needed (dryness).    . traMADol (ULTRAM) 50 MG tablet Take 1 tablet (50 mg total) by mouth every 6 (six) hours as needed. 75 tablet 2  . Diclofenac Sodium 1.5 % SOLN Place 1 mL onto the skin once as needed. 150 mL 1  . Diclofenac Sodium 3 % GEL Place 1 application onto the skin 2 (two) times daily. To affected area. 100 g 0  . tiZANidine  (ZANAFLEX) 4 MG tablet Take 1 tablet (4 mg total) by mouth every 6 (six) hours as needed for muscle spasms. 60 tablet 1   No facility-administered medications prior to visit.     ROS Review of Systems  Constitutional: Negative for appetite change, diaphoresis, fatigue and unexpected weight change.  HENT: Negative.  Negative for trouble swallowing.   Eyes: Negative for visual disturbance.  Respiratory: Negative for cough, chest tightness, shortness of breath and wheezing.   Cardiovascular: Negative.  Negative for chest pain, palpitations and leg swelling.  Gastrointestinal: Negative for abdominal pain, blood in stool, constipation, diarrhea, nausea and vomiting.  Endocrine: Negative.  Negative for polydipsia, polyphagia and polyuria.  Genitourinary: Negative.  Negative for difficulty urinating.  Musculoskeletal: Positive for arthralgias and back pain. Negative for joint swelling, myalgias and neck pain.  Skin: Negative.  Negative for color change and rash.  Allergic/Immunologic: Negative.   Neurological: Negative.  Negative for dizziness, weakness and light-headedness.  Hematological: Negative for adenopathy. Does not bruise/bleed easily.  Psychiatric/Behavioral: Negative.     Objective:  BP (!) 150/60 (BP Location: Left Arm, Patient Position: Sitting, Cuff Size: Normal)   Pulse 91   Temp 98.3 F (36.8 C) (Oral)   Ht 5' 4.5" (1.638 m)   Wt 212 lb (96.2 kg)   SpO2 98%   BMI 35.83 kg/m   BP  Readings from Last 3 Encounters:  01/13/17 (!) 150/60  07/14/16 146/89  07/08/16 140/71    Wt Readings from Last 3 Encounters:  01/13/17 212 lb (96.2 kg)  07/14/16 224 lb (101.6 kg)  07/08/16 224 lb (101.6 kg)    Physical Exam  Constitutional: She is oriented to person, place, and time. No distress.  HENT:  Head: Normocephalic and atraumatic.  Mouth/Throat: Oropharynx is clear and moist. No oropharyngeal exudate.  Eyes: Conjunctivae are normal. Right eye exhibits no discharge. Left  eye exhibits no discharge. No scleral icterus.  Neck: Normal range of motion. Neck supple. No JVD present. No thyromegaly present.  Cardiovascular: Normal rate, regular rhythm and intact distal pulses.  Exam reveals no gallop and no friction rub.   No murmur heard. Pulmonary/Chest: Breath sounds normal. No respiratory distress. She has no wheezes. She has no rales. She exhibits no tenderness.  Abdominal: Soft. Bowel sounds are normal. She exhibits no distension and no mass. There is no tenderness. There is no rebound and no guarding.  Musculoskeletal: Normal range of motion. She exhibits no edema, tenderness or deformity.  Lymphadenopathy:    She has no cervical adenopathy.  Neurological: She is alert and oriented to person, place, and time.  Skin: Skin is warm and dry. No rash noted. She is not diaphoretic. No erythema. No pallor.  Vitals reviewed.   Lab Results  Component Value Date   WBC 8.5 04/13/2016   HGB 13.2 04/13/2016   HCT 39.5 04/13/2016   PLT 217 04/13/2016   GLUCOSE 107 (H) 06/23/2016   CHOL 201 (H) 10/14/2015   TRIG 83.0 10/14/2015   HDL 61.30 10/14/2015   LDLDIRECT 121.0 09/05/2014   LDLCALC 123 (H) 10/14/2015   ALT 5 10/14/2015   AST 10 10/14/2015   NA 137 06/23/2016   K 3.6 06/23/2016   CL 97 06/23/2016   CREATININE 0.70 06/23/2016   BUN 10 06/23/2016   CO2 35 (H) 06/23/2016   TSH 0.47 10/14/2015   INR 1.01 05/28/2014   HGBA1C 6.9 01/13/2017    Dg Bone Density  Result Date: 12/12/2016 Date of study: 12/08/16 Exam: DUAL X-RAY ABSORPTIOMETRY (DXA) FOR BONE MINERAL DENSITY (BMD) Instrument: [employer] [facility] Provider: PCP Indication: follow up for low BMD Comparison: none (please note that it is not possible to compare data from different instruments) Clinical data: Pt is a 71 y.o. female with previous history of fracture. On calcium and vitamin D. Results:  Lumbar spine (L1-L4) Femoral neck (FN) 33% distal radius T-score -1.9 RFN:-1.5 LFN:-2.4 n/a  Change in BMD from previous DXA test (%) n/a Down 5.2% n/a (*) statistically significant Assessment: the BMD is low according to the [facility] classification for osteoporosis (see below). Fracture risk: moderate FRAX score: 10 year major osteoporotic risk: 9.8%. 10 year hip fracture risk: 2.4%. These are under the thresholds for treatment of 20% and 3%, respectively. Comments: the technical quality of the study is good. Evaluation for secondary causes should be considered if clinically indicated. Recommend optimizing calcium (1200 mg/day) and vitamin D (800 IU/day) intake. Followup: Repeat BMD is appropriate after 2 years or after 1-2 years if starting treatment. WHO criteria for diagnosis of osteoporosis in postmenopausal women and in men 22 y/o or older: - normal: T-score -1.0 to + 1.0 - osteopenia/low bone density: T-score between -2.5 and -1.0 - osteoporosis: T-score below -2.5 - severe osteoporosis: T-score below -2.5 with history of fragility fracture Note: although not part of the WHO classification, the presence of a  fragility fracture, regardless of the T-score, should be considered diagnostic of osteoporosis, provided other causes for the fracture have been excluded. Treatment: The National Osteoporosis Foundation recommends that treatment be considered in postmenopausal women and men age 84 or older with: 1. Hip or vertebral (clinical or morphometric) fracture 2. T-score of - 2.5 or lower at the spine or hip 3. 10-year fracture probability by FRAX of at least 20% for a major osteoporotic fracture and 3% for a hip fracture Loura Pardon MD    Assessment & Plan:   Macil was seen today for hyperlipidemia, diabetes, back pain and osteoarthritis.  Diagnoses and all orders for this visit:  Age-related osteoporosis without current pathological fracture -     denosumab (PROLIA) injection 60 mg; Inject 60 mg into the skin once.  Morbid obesity ([place])- she is working on her lifestyle modifications to lose  weight.  Type 2 diabetes mellitus with complication, without long-term current use of insulin ([place])- her A1c is down to 6.6%. Her blood sugars are adequately well-controlled with no medications. -     CBC with Differential/Platelet; Future -     Basic metabolic panel; Future -     Urinalysis, Routine w reflex microscopic; Future -     Microalbumin / creatinine urine ratio; Future -     POCT glycosylated hemoglobin (Hb A1C)  DDD (degenerative disc disease), lumbar -     tiZANidine (ZANAFLEX) 4 MG tablet; Take 1 tablet (4 mg total) by mouth every 8 (eight) hours as needed for muscle spasms.  Nontoxic multinodular goiter- exam is normal, TFTs are normal, she is euthyroid. Will continue to monitor this. -     Thyroid Panel With TSH; Future  Hyperlipidemia with target LDL less than 100- she has achieved her LDL goal is doing well on the statin. -     Lipid panel; Future -     Thyroid Panel With TSH; Future  Need for influenza vaccination -     Flu vaccine HIGH DOSE PF (Fluzone High dose)   I have discontinued Ms. Hebel Diclofenac Sodium and Diclofenac Sodium. I have also changed her tiZANidine. Additionally, I am having her maintain her Aspirin-Calcium Carbonate, ipratropium-albuterol, OXYGEN, cholecalciferol, sodium chloride, Fluticasone Furoate, atorvastatin, amLODipine, traMADol, Albuterol Sulfate, and hydrochlorothiazide. We administered denosumab.  Meds ordered this encounter  Medications  . denosumab (PROLIA) injection 60 mg  . tiZANidine (ZANAFLEX) 4 MG tablet    Sig: Take 1 tablet (4 mg total) by mouth every 8 (eight) hours as needed for muscle spasms.    Dispense:  60 tablet    Refill:  5     Follow-up: No Follow-up on file.  Scarlette Calico, MD

## 2017-01-14 LAB — BASIC METABOLIC PANEL
BUN: 15 mg/dL (ref 6–23)
CO2: 33 mEq/L — ABNORMAL HIGH (ref 19–32)
Calcium: 9.7 mg/dL (ref 8.4–10.5)
Chloride: 96 mEq/L (ref 96–112)
Creatinine, Ser: 0.77 mg/dL (ref 0.40–1.20)
GFR: 94.51 mL/min (ref >60.00)
Glucose, Bld: 96 mg/dL (ref 70–99)
Potassium: 2.9 mEq/L — ABNORMAL LOW (ref 3.5–5.1)
Sodium: 139 mEq/L (ref 135–145)

## 2017-01-14 LAB — LIPID PANEL
Cholesterol: 162 mg/dL (ref 0–200)
HDL: 57.5 mg/dL (ref >39.00)
LDL Cholesterol: 84 mg/dL (ref 0–99)
NonHDL: 104.46
Total CHOL/HDL Ratio: 3
Triglycerides: 102 mg/dL (ref 0.0–149.0)
VLDL: 20.4 mg/dL (ref 0.0–40.0)

## 2017-01-14 LAB — THYROID PANEL WITH TSH
Free Thyroxine Index: 2.5 (ref 1.4–3.8)
T3 Uptake: 31 % (ref 22–35)
T4 TOTAL: 8.2 ug/dL (ref 5.1–11.9)
TSH: 0.48 mIU/L

## 2017-01-18 ENCOUNTER — Other Ambulatory Visit (INDEPENDENT_AMBULATORY_CARE_PROVIDER_SITE_OTHER): Payer: PPO

## 2017-01-18 DIAGNOSIS — E118 Type 2 diabetes mellitus with unspecified complications: Secondary | ICD-10-CM

## 2017-01-18 DIAGNOSIS — Z0000 Encounter for general adult medical examination without abnormal findings: Secondary | ICD-10-CM

## 2017-01-18 LAB — URINALYSIS, ROUTINE W REFLEX MICROSCOPIC
Bilirubin Urine: NEGATIVE
Hgb urine dipstick: NEGATIVE
KETONES UR: NEGATIVE
LEUKOCYTES UA: NEGATIVE
Nitrite: NEGATIVE
PH: 7 (ref 5.0–8.0)
RBC / HPF: NONE SEEN (ref 0–?)
SPECIFIC GRAVITY, URINE: 1.015 (ref 1.000–1.030)
Total Protein, Urine: NEGATIVE
URINE GLUCOSE: NEGATIVE
UROBILINOGEN UA: 0.2 (ref 0.0–1.0)

## 2017-01-18 LAB — MICROALBUMIN / CREATININE URINE RATIO
Creatinine,U: 96.7 mg/dL
MICROALB/CREAT RATIO: 1.3 mg/g (ref 0.0–30.0)
Microalb, Ur: 1.3 mg/dL (ref 0.0–1.9)

## 2017-01-25 ENCOUNTER — Encounter: Payer: Self-pay | Admitting: Internal Medicine

## 2017-01-25 ENCOUNTER — Telehealth: Payer: Self-pay | Admitting: Internal Medicine

## 2017-01-25 ENCOUNTER — Other Ambulatory Visit: Payer: Self-pay | Admitting: Internal Medicine

## 2017-01-25 DIAGNOSIS — E876 Hypokalemia: Secondary | ICD-10-CM

## 2017-01-25 MED ORDER — UMECLIDINIUM-VILANTEROL 62.5-25 MCG/INH IN AEPB: 1.0000 | INHALATION_SPRAY | Freq: Every day | RESPIRATORY_TRACT | 0 refills | Status: DC

## 2017-01-25 MED ORDER — POTASSIUM CHLORIDE CRYS ER 20 MEQ PO TBCR: 20.0000 meq | EXTENDED_RELEASE_TABLET | Freq: Two times a day (BID) | ORAL | 1 refills | Status: DC

## 2017-01-25 NOTE — Telephone Encounter (Signed)
Pt called for her test results from 8/30 please call back

## 2017-01-25 NOTE — Telephone Encounter (Signed)
Patient in the lobby - Patient checking for samples of Anoro - pr

## 2017-01-25 NOTE — Telephone Encounter (Signed)
Spoke with MR about the Anoro, he stated that it was ok to give the patient a sample. Patient received sample in lobby. Nothing else needed at time of call.

## 2017-01-25 NOTE — Telephone Encounter (Signed)
Pt calling back about samples of ANORO.  Is out and needs something today.-tr

## 2017-01-25 NOTE — Telephone Encounter (Signed)
Low potassium level - I have sent a supplement to her pharmacy to treat this Her WBC was mildly elevated, I am not concerns about this - will recheck the next time I see her The other lab are okay

## 2017-01-25 NOTE — Telephone Encounter (Signed)
Spoke with pt, I advised her we did not have any samples of Arnuity. She asked if we had Anoro and states she was on Anoro but insurance would cover so she had to switch to [place]. If we have an Anoro sample can we give to pt until she can receive Arnuity? MR please advise.

## 2017-01-25 NOTE — Telephone Encounter (Signed)
Pt called back checking on these because she received a text from [facility] saying that a prescription was ready and was confused what it was for. I gave her MD response. She expressed understanding and did not have any questions at this time.

## 2017-01-27 ENCOUNTER — Telehealth: Payer: Self-pay | Admitting: Internal Medicine

## 2017-01-27 MED ORDER — PREDNISONE 10 MG PO TABS: ORAL_TABLET | ORAL | 0 refills | Status: DC

## 2017-01-27 NOTE — Telephone Encounter (Signed)
Called spoke with patient to discuss refills on her Proair and Duoneb.  Last ov was 11.30.17 w/ MR, recs to follow up in 6 months.  Advised patient will be happy to refill her medications but she is overdue for appt.  MR with no openings until 11.1.18 so appt was scheduled with TP for 9.24.18 @ 0930.  Refills sent to verified pharmacy.  Patient also mentioned some increased SOB within the last 3-4 days, wheezing, some cough - dry, some sinus congestion with clear/white mucus, some PND.  Denies tightness, f/c/s, chest pain, hemoptysis.  Offered to move up appt with TP to this afternoon but is at work and not able to come for appt.  She is requesting prednisone to help with symptoms.  MR is unavailable at this time, will route to DOD for recommendations Dr Annamaria Boots please advise, thank you.   Allergies  Allergen Reactions  . Nickel Rash    Severe rash to infection: pt is allergic to all metals other than sterling silver or gold jewelry.   [name] [Olmesartan] Swelling    Swelling of face and arms   . [name] [Valsartan] Swelling    Swelling of face and arms   . Lisinopril Cough  . Codeine Other (See Comments)    jittery  . Hydrocodone-Acetaminophen Nausea And Vomiting  . Monosodium Glutamate Other (See Comments)    Facial swelling per pt  . Lead Acetate Rash

## 2017-01-27 NOTE — Telephone Encounter (Signed)
Ok prednisone 10 mg, # 20, 4 X 2 DAYS, 3 X 2 DAYS, 2 X 2 DAYS, 1 X 2 DAYS  

## 2017-01-27 NOTE — Telephone Encounter (Signed)
Called and spoke with pt and she is aware of rx that has been sent to the pharmacy.  Nothing further is needed.  

## 2017-01-28 MED ORDER — DENOSUMAB 60 MG/ML ~~LOC~~ SOLN: 60.0000 mg | Freq: Once | SUBCUTANEOUS | Status: DC

## 2017-01-28 NOTE — Addendum Note (Signed)
Addended by: Aviva Signs M on: 01/28/2017 11:57 AM   Modules accepted: Orders

## 2017-02-07 ENCOUNTER — Encounter: Payer: Self-pay | Admitting: Adult Health

## 2017-02-07 ENCOUNTER — Ambulatory Visit (INDEPENDENT_AMBULATORY_CARE_PROVIDER_SITE_OTHER): Payer: PPO | Admitting: Adult Health

## 2017-02-07 DIAGNOSIS — J9611 Chronic respiratory failure with hypoxia: Secondary | ICD-10-CM | POA: Diagnosis not present

## 2017-02-07 DIAGNOSIS — J449 Chronic obstructive pulmonary disease, unspecified: Secondary | ICD-10-CM | POA: Diagnosis not present

## 2017-02-07 MED ORDER — ALBUTEROL SULFATE 108 (90 BASE) MCG/ACT IN AEPB: 1.0000 | INHALATION_SPRAY | Freq: Four times a day (QID) | RESPIRATORY_TRACT | 5 refills | Status: DC | PRN

## 2017-02-07 MED ORDER — IPRATROPIUM-ALBUTEROL 0.5-2.5 (3) MG/3ML IN SOLN: RESPIRATORY_TRACT | 5 refills | Status: DC

## 2017-02-07 NOTE — Assessment & Plan Note (Signed)
Compensated on present regimen   Plan  Patient Instructions  Continue on ARNUITY 1 puff daily , rinse after use.  Continue on Duoneb Four times a day  .  Continue oxygen 2l/m . (3l/m on POC) .  Follow up with Dr. Chase Caller in 6 months and As needed

## 2017-02-07 NOTE — Progress Notes (Signed)
@Patient  ID: Victoria [name], female    DOB: 07/06/1945, 71 y.o.   MRN: [id number]  Chief Complaint  Patient presents with  . Follow-up    COPD    Referring provider: Janith Lima, MD  HPI: 71 year old female former smoker followed for gold 3 COPD and oxygen dependent respiratory failure, OSA   TEST /Events  Pulmonary function test June 2017: stage III COPD FEV1 0.88 L/47% in 10/21/2015 and 0.69 L/37% later in June 2017 CT chest lung cancer screening 10/09/2015: Emphysema with very small lung nodules Echo January 2016: Shows grade 1 diastolic dysfunction Nuclear medicine cardiac stress to 12/11/2015: Normal tests Lexiscan Myoview with ejection fraction 75% PSG 12/2014 >AHI 35/h   02/07/2017 Follow up : COPD and O2 RF  Pt returns for follow up for COPD . Says overall she is doing well. Gets winded if she is rushing . She denies flare of cough or wheezing. She remains on Duoneb Four times a day  . Insurance cost for combo inhalers are too much or does not cover well. She is on ARNUITY daily .  She remains on O2 2l/m . When she is out from home uses a POC on 3l/m . Works well but says it is too heavy but can not afford lighter weight option  She was previously on CPAP At bedtime  For sleep apnea but is no longer wearing . Says she can not wear. Uses oxygen only .   She remains active in church .    Allergies  Allergen Reactions  . Nickel Rash    Severe rash to infection: pt is allergic to all metals other than sterling silver or gold jewelry.   [name] [Olmesartan] Swelling    Swelling of face and arms   . [name] [Valsartan] Swelling    Swelling of face and arms   . Lisinopril Cough  . Codeine Other (See Comments)    jittery  . Hydrocodone-Acetaminophen Nausea And Vomiting  . Monosodium Glutamate Other (See Comments)    Facial swelling per pt  . Lead Acetate Rash    Immunization History  Administered Date(s) Administered  . Influenza Split 01/16/2011  . Influenza,  High Dose Seasonal PF 01/13/2017  . Influenza,inj,Quad PF,6+ Mos 02/01/2013, 02/27/2014, 04/07/2015, 01/08/2016  . Pneumococcal Conjugate-13 02/01/2013  . Pneumococcal Polysaccharide-23 10/29/2014  . Td 12/06/2011    Past Medical History:  Diagnosis Date  . Abdominal aneurysm ([place])    Dr. Oneida Alar follows lLOV 2 ''17 per pt "around 2 cm"  . Anemia    as a child  . Arthritis    left ankle, right knee, right SI joint, wrists, lower back  . COPD (chronic obstructive pulmonary disease) ([place])    ephysema-Dr. Chase Caller  . Dyspnea   . Headache    as a child would have terrible headaches during season changes  . 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 Hyperlipidemia   . Hypertension   . Multiple thyroid nodules   . Murmur, cardiac 1950  . Pneumonia   . Pre-diabetes   . Requires continuous at home supplemental oxygen    2 L 24/7  . Sebaceous cyst    hairline sebaceous cyst left posterior neck to be excised 04-13-16 by Dr. Harlow Asa in [facility]  . Sleep apnea    cpap used sometimes, uses oxygen concentrator 2 l/m nasally bedtime  . Varicella as child    Tobacco History: History  Smoking Status  . Former  Smoker  . Packs/day: 1.00  . Years: 50.00  . Types: Cigarettes  . Quit date: 04/16/2011  Smokeless Tobacco  . Never Used    Comment: quit that date when she had to go to ER    Counseling given: Not Answered   Outpatient Encounter Prescriptions as of 02/07/2017  Medication Sig  . Albuterol Sulfate (PROAIR RESPICLICK) 272 (90 Base) MCG/ACT AEPB Inhale 1-2 puffs into the lungs every 6 (six) hours as needed.  [name] amLODipine (NORVASC) 10 MG tablet Take 1 tablet (10 mg total) by mouth daily.  . Aspirin-Calcium Carbonate (BAYER WOMENS) 81-300 MG TABS Take 1 tablet by mouth daily with breakfast.  . atorvastatin (LIPITOR) 40 MG tablet Take 1 tablet (40 mg total) by mouth daily. Takes daily AM  . cholecalciferol (VITAMIN D) 1000 units tablet Take 2,000 Units by mouth daily.    . Fluticasone Furoate (ARNUITY ELLIPTA) 100 MCG/ACT AEPB Inhale 1 puff into the lungs daily.  . hydrochlorothiazide (HYDRODIURIL) 25 MG tablet TAKE ONE TABLET BY MOUTH ONCE DAILY  . ipratropium-albuterol (DUONEB) 0.5-2.5 (3) MG/3ML SOLN USE ONE VIAL IN NEBULIZER 4 TIMES DAILY  . OXYGEN Inhale 3 L into the lungs. When exerting self   . potassium chloride SA (K-DUR,KLOR-CON) 20 MEQ tablet Take 1 tablet (20 mEq total) by mouth 2 (two) times daily.  . sodium chloride (OCEAN) 0.65 % SOLN nasal spray Place 1 spray into both nostrils as needed (dryness).  [name] tiZANidine (ZANAFLEX) 4 MG tablet Take 1 tablet (4 mg total) by mouth every 8 (eight) hours as needed for muscle spasms.  . traMADol (ULTRAM) 50 MG tablet Take 1 tablet (50 mg total) by mouth every 6 (six) hours as needed.  . umeclidinium-vilanterol (ANORO ELLIPTA) 62.5-25 MCG/INH AEPB Inhale 1 puff into the lungs daily.  . [DISCONTINUED] Albuterol Sulfate (PROAIR RESPICLICK) 536 (90 Base) MCG/ACT AEPB Inhale 1-2 puffs into the lungs every 6 (six) hours as needed.  . [DISCONTINUED] ipratropium-albuterol (DUONEB) 0.5-2.5 (3) MG/3ML SOLN USE ONE VIAL IN NEBULIZER 4 TIMES DAILY  . [DISCONTINUED] predniSONE (DELTASONE) 10 MG tablet Take 4 tabs x 2 days, 3 tabs x 2 days, 2 tab x 2 days, 1 tab x 2 days then stop (Patient not taking: Reported on 02/07/2017)   No facility-administered encounter medications on file as of 02/07/2017.      Review of Systems  Constitutional:   No  weight loss, night sweats,  Fevers, chills, + fatigue, or  lassitude.  HEENT:   No headaches,  Difficulty swallowing,  Tooth/dental problems, or  Sore throat,                No sneezing, itching, ear ache, nasal congestion, post nasal drip,   CV:  No chest pain,  Orthopnea, PND, swelling in lower extremities, anasarca, dizziness, palpitations, syncope.   GI  No heartburn, indigestion, abdominal pain, nausea, vomiting, diarrhea, change in bowel habits, loss of appetite, bloody  stools.   Resp: No shortness of breath with exertion or at rest.  No excess mucus, no productive cough,  No non-productive cough,  No coughing up of blood.  No change in color of mucus.  No wheezing.  No chest wall deformity  Skin: no rash or lesions.  GU: no dysuria, change in color of urine, no urgency or frequency.  No flank pain, no hematuria   MS:  No joint pain or swelling.  No decreased range of motion.  No back pain.    Physical Exam  BP (!) 126/58 (  BP Location: Left Arm, Cuff Size: Normal)   Pulse 85   Ht 5' 4.5" (1.638 m)   Wt 213 lb (96.6 kg)   SpO2 97%   BMI 36.00 kg/m   GEN: A/Ox3; pleasant , NAD, well nourished    HEENT:  [place]/AT,  EACs-clear, TMs-wnl, NOSE-clear, THROAT-clear, no lesions, no postnasal drip or exudate noted.   NECK:  Supple w/ fair ROM; no JVD; normal carotid impulses w/o bruits; no thyromegaly or nodules palpated; no lymphadenopathy.    RESP  Clear  P & A; w/o, wheezes/ rales/ or rhonchi. no accessory muscle use, no dullness to percussion  CARD:  RRR, no m/r/g, no peripheral edema, pulses intact, no cyanosis or clubbing.  GI:   Soft & nt; nml bowel sounds; no organomegaly or masses detected.   Musco: Warm bil, no deformities or joint swelling noted.   Neuro: alert, no focal deficits noted.    Skin: Warm, no lesions or rashes    Lab Results:  CBC    BNP No results found for: BNP  ProBNP No results found for: PROBNP  Imaging: No results found.   Assessment & Plan:   COPD, severe ([place]) Compensated on present regimen   Plan  Patient Instructions  Continue on ARNUITY 1 puff daily , rinse after use.  Continue on Duoneb Four times a day  .  Continue oxygen 2l/m . (3l/m on POC) .  Follow up with Dr. Chase Caller in 6 months and As needed         Chronic respiratory failure with hypoxia ([place]) Cont on o2 2lm/ , 3l/m on POC      Maxamillion Banas, NP 02/07/2017

## 2017-02-07 NOTE — Patient Instructions (Signed)
Continue on ARNUITY 1 puff daily , rinse after use.  Continue on Duoneb Four times a day  .  Continue oxygen 2l/m . (3l/m on POC) .  Follow up with Dr. Chase Caller in 6 months and As needed

## 2017-02-07 NOTE — Assessment & Plan Note (Signed)
Cont on o2 2lm/ , 3l/m on POC

## 2017-02-11 DIAGNOSIS — J449 Chronic obstructive pulmonary disease, unspecified: Secondary | ICD-10-CM | POA: Diagnosis not present

## 2017-02-21 ENCOUNTER — Other Ambulatory Visit: Payer: Self-pay | Admitting: Internal Medicine

## 2017-02-21 DIAGNOSIS — I10 Essential (primary) hypertension: Secondary | ICD-10-CM

## 2017-02-23 ENCOUNTER — Encounter (HOSPITAL_COMMUNITY): Payer: Self-pay | Admitting: Emergency Medicine

## 2017-02-23 ENCOUNTER — Emergency Department (HOSPITAL_COMMUNITY)
Admission: EM | Admit: 2017-02-23 | Discharge: 2017-02-23 | Disposition: A | Discharge status: Home / Self Care | Payer: PPO | Attending: Emergency Medicine | Admitting: Emergency Medicine

## 2017-02-23 DIAGNOSIS — Z87891 Personal history of nicotine dependence: Secondary | ICD-10-CM | POA: Insufficient documentation

## 2017-02-23 DIAGNOSIS — J449 Chronic obstructive pulmonary disease, unspecified: Secondary | ICD-10-CM | POA: Diagnosis not present

## 2017-02-23 DIAGNOSIS — K0889 Other specified disorders of teeth and supporting structures: Secondary | ICD-10-CM | POA: Insufficient documentation

## 2017-02-23 DIAGNOSIS — E119 Type 2 diabetes mellitus without complications: Secondary | ICD-10-CM | POA: Diagnosis not present

## 2017-02-23 DIAGNOSIS — I10 Essential (primary) hypertension: Secondary | ICD-10-CM | POA: Insufficient documentation

## 2017-02-23 DIAGNOSIS — Z79899 Other long term (current) drug therapy: Secondary | ICD-10-CM | POA: Diagnosis not present

## 2017-02-23 MED ORDER — PENICILLIN V POTASSIUM 500 MG PO TABS: 500.0000 mg | ORAL_TABLET | Freq: Four times a day (QID) | ORAL | 0 refills | Status: DC

## 2017-02-23 MED ORDER — TRAMADOL HCL 50 MG PO TABS
50.0000 mg | ORAL_TABLET | Freq: Once | ORAL | Status: AC
  Administered 2017-02-23: 50 mg via ORAL
  Filled 2017-02-23: qty 1

## 2017-02-23 MED ORDER — HYDROCODONE-ACETAMINOPHEN 5-325 MG PO TABS: 1.0000 | ORAL_TABLET | Freq: Once | ORAL | Status: DC

## 2017-02-23 MED ORDER — TRAMADOL HCL 50 MG PO TABS: 50.0000 mg | ORAL_TABLET | Freq: Four times a day (QID) | ORAL | 0 refills | Status: DC | PRN

## 2017-02-23 NOTE — ED Notes (Signed)
Bed: [facility] Expected date:  Expected time:  Means of arrival:  Comments: 

## 2017-02-23 NOTE — ED Triage Notes (Signed)
Pt c/o R upper dental pain x several days. Pt has attempted OTC pain meds without relief, last dose of ibuprofen at 3am.

## 2017-02-23 NOTE — Discharge Instructions (Signed)
See a dentist as soon as possible.

## 2017-02-23 NOTE — ED Provider Notes (Signed)
[facility] DEPT Provider Note   CSN: [id number] Arrival date & time: 02/23/17  0902     History   Chief Complaint Chief Complaint  Patient presents with  . Dental Pain    HPI Victoria [name] is a 71 y.o. female. Chief complaint is tooth pain  HPI: 12 through female states for about the last 7 days she's had pain in her right mandibular tooth. She states that she look to her insurance provider and does not have dental coverage until November 1. She states the pain is worsening to the point that she is unable to sleep and she requests assistance with pain control  Past Medical History:  Diagnosis Date  . Abdominal aneurysm ([place])    Dr. Oneida Alar follows lLOV 2 ''17 per pt "around 2 cm"  . Anemia    as a child  . Arthritis    left ankle, right knee, right SI joint, wrists, lower back  . COPD (chronic obstructive pulmonary disease) ([place])    ephysema-Dr. Chase Caller  . Dyspnea   . Headache    as a child would have terrible headaches during season changes  . 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 Hyperlipidemia   . Hypertension   . Multiple thyroid nodules   . Murmur, cardiac 1950  . Pneumonia   . Pre-diabetes   . Requires continuous at home supplemental oxygen    2 L 24/7  . Sebaceous cyst    hairline sebaceous cyst left posterior neck to be excised 04-13-16 by Dr. Harlow Asa in [facility]  . Sleep apnea    cpap used sometimes, uses oxygen concentrator 2 l/m nasally bedtime  . Varicella as child    Patient Active Problem List   Diagnosis Date Noted  . Chronic respiratory failure with hypoxia ([place]) 02/07/2017  . DDD (degenerative disc disease), lumbar 01/13/2017  . Estrogen deficiency 10/20/2016  . Type II diabetes mellitus with manifestations ([place]) 01/08/2016  . Visit for screening mammogram 10/14/2015  . Atherosclerosis of native coronary artery of native heart without angina pectoris 10/14/2015  . History of positive PPD 10/08/2015  . COPD, severe ([place])  09/04/2015  . AAA (abdominal aortic aneurysm) without rupture ([place]) 05/13/2015  . Nontoxic multinodular goiter 05/13/2015  . OSA (obstructive sleep apnea) 11/15/2014  . Hypokalemia 09/05/2014  . Essential hypertension 03/11/2014  . Osteoporosis 02/11/2014  . Routine health maintenance 12/07/2011  . DJD (degenerative joint disease) of knee   . Hyperlipidemia with target LDL less than 100     Past Surgical History:  Procedure Laterality Date  . APPENDECTOMY     2008  . [place]  . COLONOSCOPY    . COLONOSCOPY WITH PROPOFOL N/A 04/16/2016   Procedure: COLONOSCOPY WITH PROPOFOL;  Surgeon: Carol Ada, MD;  Location: [facility];  Service: Endoscopy;  Laterality: N/A;  . CYST REMOVAL NECK Left 04/13/2016   Procedure: EXCISION OF SEBACEOUS CYST LEFT POSTERIOR NECK;  Surgeon: Armandina Gemma, MD;  Location: [facility];  Service: General;  Laterality: Left;  . Excision of Pelvic Absess, Right Ovary     2008  . TUBAL LIGATION  1980    OB History    No data available       Home Medications    Prior to Admission medications   Medication Sig Start Date End Date Taking? Authorizing Provider  Albuterol Sulfate (PROAIR RESPICLICK) 010 (90 Base) MCG/ACT AEPB Inhale 1-2 puffs into the lungs every 6 (  six) hours as needed. 02/07/17   Parrett, Fonnie Mu, NP  amLODipine (NORVASC) 10 MG tablet Take 1 tablet (10 mg total) by mouth daily. 06/23/16   Janith Lima, MD  Aspirin-Calcium Carbonate (BAYER WOMENS) 81-300 MG TABS Take 1 tablet by mouth daily with breakfast.    [provider]  atorvastatin (LIPITOR) 40 MG tablet Take 1 tablet (40 mg total) by mouth daily. Takes daily AM 06/23/16   Janith Lima, MD  cholecalciferol (VITAMIN D) 1000 units tablet Take 2,000 Units by mouth daily.    [provider]  Fluticasone Furoate (ARNUITY ELLIPTA) 100 MCG/ACT AEPB Inhale 1 puff into the lungs daily. 06/23/16   Janith Lima, MD  hydrochlorothiazide (HYDRODIURIL) 25 MG tablet  Take 1 tablet (25 mg total) by mouth daily. 02/22/17   Janith Lima, MD  ipratropium-albuterol (DUONEB) 0.5-2.5 (3) MG/3ML SOLN USE ONE VIAL IN NEBULIZER 4 TIMES DAILY 02/07/17   Parrett, Fonnie Mu, NP  OXYGEN Inhale 3 L into the lungs. When exerting self     [provider]  penicillin v potassium (VEETID) 500 MG tablet Take 1 tablet (500 mg total) by mouth 4 (four) times daily. 02/23/17   Tanna Furry, MD  potassium chloride SA (K-DUR,KLOR-CON) 20 MEQ tablet Take 1 tablet (20 mEq total) by mouth 2 (two) times daily. 01/25/17   Janith Lima, MD  sodium chloride (OCEAN) 0.65 % SOLN nasal spray Place 1 spray into both nostrils as needed (dryness).    [provider]  tiZANidine (ZANAFLEX) 4 MG tablet Take 1 tablet (4 mg total) by mouth every 8 (eight) hours as needed for muscle spasms. 01/13/17   Janith Lima, MD  traMADol (ULTRAM) 50 MG tablet Take 1 tablet (50 mg total) by mouth every 6 (six) hours as needed. 02/23/17   Tanna Furry, MD  umeclidinium-vilanterol [facility] ELLIPTA) 62.5-25 MCG/INH AEPB Inhale 1 puff into the lungs daily. 01/25/17   Brand Males, MD    Family History Family History  Problem Relation Age of Onset  . Heart disease Mother        MI - fatal  . Hypertension Mother   . Stroke Father 58       fatal  . Alzheimer's disease Father   . Alzheimer's disease Brother   . Hyperlipidemia Brother   . Hypertension Brother   . Diabetes Brother   . Hypertension Brother   . Hyperlipidemia Brother     Social History Social History  Substance Use Topics  . Smoking status: Former Smoker    Packs/day: 1.00    Years: 50.00    Types: Cigarettes    Quit date: 04/16/2011  . Smokeless tobacco: Never Used     Comment: quit that date when she had to go to ER   . Alcohol use 0.0 oz/week     Comment: rare occasion     Allergies   Nickel; Benicar [olmesartan]; [name] [valsartan]; Lisinopril; Codeine; Hydrocodone-acetaminophen; Monosodium glutamate; and Lead  acetate   Review of Systems Review of Systems  HENT: Positive for dental problem.      Physical Exam Updated Vital Signs BP (!) 160/77 (BP Location: Right Arm)   Pulse 75   Temp 97.7 F (36.5 C) (Oral)   Resp 18   SpO2 96%   Physical Exam  Constitutional: She is oriented to person, place, and time. She appears well-developed and well-nourished. No distress.  HENT:  Head: Normocephalic.  Right second molar, tooth #3 is tooth of concern.  It appears complete. No fracture. No gingival swelling. It is tender to percuss her bite. No external facial swelling.  Eyes: Pupils are equal, round, and reactive to light. Conjunctivae are normal. No scleral icterus.  Neck: Normal range of motion. Neck supple. No thyromegaly present.  Cardiovascular: Normal rate and regular rhythm.  Exam reveals no gallop and no friction rub.   No murmur heard. Pulmonary/Chest: Effort normal and breath sounds normal. No respiratory distress. She has no wheezes. She has no rales.  Abdominal: Soft. Bowel sounds are normal. She exhibits no distension. There is no tenderness. There is no rebound.  Musculoskeletal: Normal range of motion.  Neurological: She is alert and oriented to person, place, and time.  Skin: Skin is warm and dry. No rash noted.  Psychiatric: She has a normal mood and affect. Her behavior is normal.     ED Treatments / Results  Labs (all labs ordered are listed, but only abnormal results are displayed) Labs Reviewed - No data to display  EKG  EKG Interpretation None       Radiology No results found.  Procedures Procedures (including critical care time)  Medications Ordered in ED Medications  traMADol (ULTRAM) tablet 50 mg (not administered)     Initial Impression / Assessment and Plan / ED Course  I have reviewed the triage vital signs and the nursing notes.  Pertinent labs & imaging results that were available during my care of the patient were reviewed by me and  considered in my medical decision making (see chart for details).     Slowly worsening dental pain. Concern for possible periapical infection, as is not visible clinically. Requires dental evaluation. Plan penicillin. Tramadol for pain. Return here with facial swelling worsening symptoms.  Final Clinical Impressions(s) / ED Diagnoses   Final diagnoses:  Pain, dental    New Prescriptions New Prescriptions   PENICILLIN V POTASSIUM (VEETID) 500 MG TABLET    Take 1 tablet (500 mg total) by mouth 4 (four) times daily.   TRAMADOL (ULTRAM) 50 MG TABLET    Take 1 tablet (50 mg total) by mouth every 6 (six) hours as needed.     Tanna Furry, MD 02/23/17 1005

## 2017-02-24 ENCOUNTER — Other Ambulatory Visit: Payer: Self-pay

## 2017-02-24 NOTE — Patient Outreach (Signed)
Outreach patient after ED visit on 02/23/17 at [facility].  I spoke with patient and verified PCP.  Patient said she is scheduling a follow up with Dentist.  I explained [facility] services and 24 Hour Nurse Advice Line.  While speaking to patient she sounds like she may benefit from some help from a Education officer, museum.  Patient is eager to receive information regarding [facility] and may call back to go over engagement tool.  She could not at the time because she is driving.  Will email [facility] information per patient request as well as mail.  Also she has my contact number for any additional questions.

## 2017-02-25 ENCOUNTER — Telehealth: Payer: Self-pay | Admitting: Emergency Medicine

## 2017-02-25 NOTE — Telephone Encounter (Signed)
CM contacted by pt who states she was written a prescription for Veetid, had only 9 pills filled at the [facility] on [address] because they did not have enough in stock at the time she had it filled.  Pt went back today to get the rest of the dose and they do no have power.  Pt attempted to contact the [facility] on [address] but they could not pull the information from the store without power and pt's PCP office doesn't have power either.  CM called [facility] on [address] and confirmed pt's prescription with [place].  Advised pt that she should be able to get her proscription shortly.  No further CM needs noted at this time.

## 2017-03-01 ENCOUNTER — Telehealth: Payer: Self-pay | Admitting: Internal Medicine

## 2017-03-01 NOTE — Telephone Encounter (Signed)
Title: Randomised, Double-Blind (Sponsor Open), Placebo-Controlled, Multicentre, Dose Ranging Study to Evaluate the Efficacy and Safety of Danirixin Tablets Administered Twice Daily Compared With Placebo for 24 Weeks in Adults With COPD  Sponsor: [employer] , Protoocol Number: [id number], NCT Trial #: [id number]  ///////////////// I Victoria [name] Clent [place] 11-20-1945  to  Give status update - [place] and Advisory Letter dated 02/23/2017. I discussed interim results of above study with subject. Explained no benefit with IP but also increased pneumonia risk, musculoskeletal AE and headadaches seen with IP in the above study. It is currently not known what patient received - placebo v  IP dosage. Subject thankful for update. Thanked subject for study participation. I was her treating pulmonologist and have informed myself. In addition,  note sent to PCP Janith Lima, MD as required by the safety letter. No action required. Subject did not have pneumonia during the study. She is no longer a study participant. [employer] has cancelled drug development   Dr. Brand Males, M.D., [facility].C.P Pulmonary and Critical Care Medicine Staff Physician [place] [facility] Pager: [phone], If no answer or between  15:00h - 7:00h: call [phone]  03/01/2017 11:18 AM

## 2017-03-13 DIAGNOSIS — J449 Chronic obstructive pulmonary disease, unspecified: Secondary | ICD-10-CM | POA: Diagnosis not present

## 2017-03-17 ENCOUNTER — Telehealth: Payer: Self-pay | Admitting: Internal Medicine

## 2017-03-17 MED ORDER — UMECLIDINIUM-VILANTEROL 62.5-25 MCG/INH IN AEPB: 1.0000 | INHALATION_SPRAY | Freq: Every day | RESPIRATORY_TRACT | 0 refills | Status: DC

## 2017-03-17 NOTE — Telephone Encounter (Signed)
Pt requesting samples of anoro.  Samples left up front for pt.  Nothing further needed.

## 2017-03-21 ENCOUNTER — Telehealth: Payer: Self-pay | Admitting: Internal Medicine

## 2017-03-21 MED ORDER — DOXYCYCLINE HYCLATE 100 MG PO TABS: 100.0000 mg | ORAL_TABLET | Freq: Two times a day (BID) | ORAL | 0 refills | Status: DC

## 2017-03-21 MED ORDER — PREDNISONE 10 MG PO TABS: ORAL_TABLET | ORAL | 0 refills | Status: DC

## 2017-03-21 NOTE — Telephone Encounter (Signed)
Spoke with pt, who reports of prod cough with green mucus, nasal drainage clear in color, wheezing & increased sob x2d Denies fever, chills or sweats.  Pt is requesting abx to be sent to [facility] on [address].   MR please advise. Thanks.

## 2017-03-21 NOTE — Telephone Encounter (Signed)
Spoke with patient. She is aware of MR's recs. Medications have been explained and called into pharmacy. Nothing else needed at time of call.

## 2017-03-21 NOTE — Telephone Encounter (Signed)
  AECOPD  Please take prednisone 40 mg x1 day, then 30 mg x1 day, then 20 mg x1 day, then 10 mg x1 day, and then 5 mg x1 day and stop  Take doxycycline 100mg  po twice daily x 5 days; take after meals and avoid sunlight   Dr. Brand Males, M.D., [facility].C.P Pulmonary and Critical Care Medicine Staff Physician [place] [facility] Pager: [phone], If no answer or between  15:00h - 7:00h: call [phone]  03/21/2017 12:24 PM      Allergies  Allergen Reactions  . Nickel Rash    Severe rash to infection: pt is allergic to all metals other than sterling silver or gold jewelry.   [name] [Olmesartan] Swelling    Swelling of face and arms   . [name] [Valsartan] Swelling    Swelling of face and arms   . Lisinopril Cough  . Codeine Other (See Comments)    jittery  . Hydrocodone-Acetaminophen Nausea And Vomiting  . Monosodium Glutamate Other (See Comments)    Facial swelling per pt  . Lead Acetate Rash

## 2017-03-30 ENCOUNTER — Telehealth: Payer: Self-pay | Admitting: Internal Medicine

## 2017-03-30 NOTE — Telephone Encounter (Signed)
Go to bid + prn and then see Going straigh to prn could be problematic  Dr. Brand Males, M.D., [facility].C.P Pulmonary and Critical Care Medicine Staff Physician [place] [facility] Pager: [phone], If no answer or between  15:00h - 7:00h: call [phone]  03/30/2017 2:28 PM

## 2017-03-30 NOTE — Telephone Encounter (Signed)
Spoke with the pt  She was seen last by TP on 02/07/17  She was advised that she should take Duoneb QID  She states she is doing well and feels that she does not need this so often  She is asking if she can just use PRN  Please advise thanks

## 2017-03-30 NOTE — Telephone Encounter (Signed)
Pt is aware of MR's recommendations and voiced her understanding. Nothing further needed.

## 2017-04-04 ENCOUNTER — Telehealth: Payer: Self-pay | Admitting: Internal Medicine

## 2017-04-04 NOTE — Telephone Encounter (Signed)
Pt had been switched from Anoro to [employer] coverage, but pt is not doing as well on Arnuity.  Pt's insurance also covers [place] is interested in trying this if MR is agreeable.    Pt uses [facility]   MR please advise.  Thanks.

## 2017-04-05 ENCOUNTER — Telehealth: Payer: Self-pay | Admitting: Internal Medicine

## 2017-04-05 MED ORDER — TIOTROPIUM BROMIDE-OLODATEROL 2.5-2.5 MCG/ACT IN AERS: 2.0000 | INHALATION_SPRAY | Freq: Two times a day (BID) | RESPIRATORY_TRACT | 3 refills | Status: DC

## 2017-04-05 NOTE — Telephone Encounter (Signed)
Called pt and advised message from the provider. Pt understood and verbalized understanding. Nothing further is needed.   Spoke with pt and advised rx sent to pharmacy   

## 2017-04-05 NOTE — Telephone Encounter (Signed)
[facility] [address].///hdp

## 2017-04-05 NOTE — Telephone Encounter (Signed)
The equivalent version of anoro she needs to be on is STIOLTO; so have her take that. Going from anoro to arnuity removed the laba component  Dr. Brand Males, M.D., [facility].C.P Pulmonary and Critical Care Medicine Staff Physician [place] [facility] Pager: [phone], If no answer or between  15:00h - 7:00h: call [phone]  04/05/2017 8:25 AM

## 2017-04-05 NOTE — Telephone Encounter (Signed)
Left message for patient to call back  

## 2017-04-06 ENCOUNTER — Telehealth: Payer: Self-pay | Admitting: Internal Medicine

## 2017-04-06 MED ORDER — TIOTROPIUM BROMIDE-OLODATEROL 2.5-2.5 MCG/ACT IN AERS: 2.0000 | INHALATION_SPRAY | Freq: Every day | RESPIRATORY_TRACT | 0 refills | Status: DC

## 2017-04-06 NOTE — Telephone Encounter (Signed)
Pt has been given samples of Stiolto and shown demo . Nothing further needed.

## 2017-04-06 NOTE — Telephone Encounter (Signed)
Spoke with pt, I advised her I left samples up front for pt because Stiolto is not on her formulary and wanted to switch to Spiriva. MR is not here and I did not want pt to go without an inhaler. Her insurance plan changes next year so we can wait to see if they cover [place]. She will keep [place] updated.

## 2017-04-06 NOTE — Telephone Encounter (Signed)
Patient is returning call, CB is [phone].

## 2017-04-12 ENCOUNTER — Telehealth: Payer: Self-pay | Admitting: Internal Medicine

## 2017-04-12 NOTE — Telephone Encounter (Signed)
Fax sent to the plan Your PA has been faxed to the plan as a paper copy. Please contact the plan directly if you haven't received a determination in a typical timeframe.  You will be notified of the determination electronically and via fax. How do I know if the plan approved the PA?   Add Reminder to your Dashboard Remind me in:    Contact plan to follow up on [id number]  Will await response.

## 2017-04-12 NOTE — Telephone Encounter (Signed)
Called pt who stated that she began wheezing today. Pt was given samples of Stiolto on 04/06/17 which had been helping pt up until today when she stated that she had become more SOB and also noticed that she had been wheezing.  An acute visit was made for pt with TP tomorrow, 04/13/17 at 10:45. Nothing further needed.

## 2017-04-13 ENCOUNTER — Ambulatory Visit: Payer: PPO | Admitting: Adult Health

## 2017-04-13 ENCOUNTER — Ambulatory Visit: Payer: PPO | Admitting: Pulmonary Disease

## 2017-04-13 ENCOUNTER — Encounter: Payer: Self-pay | Admitting: Pulmonary Disease

## 2017-04-13 DIAGNOSIS — J449 Chronic obstructive pulmonary disease, unspecified: Secondary | ICD-10-CM

## 2017-04-13 DIAGNOSIS — J9611 Chronic respiratory failure with hypoxia: Secondary | ICD-10-CM

## 2017-04-13 DIAGNOSIS — G4733 Obstructive sleep apnea (adult) (pediatric): Secondary | ICD-10-CM

## 2017-04-13 MED ORDER — PREDNISONE 10 MG PO TABS: ORAL_TABLET | ORAL | 0 refills | Status: DC

## 2017-04-13 NOTE — Telephone Encounter (Signed)
Per CMM, still awaiting determination.  °

## 2017-04-13 NOTE — Progress Notes (Signed)
   Subjective:    Patient ID: Victoria [name], female    DOB: 08-Sep-1945, 71 y.o.   MRN: [id number]  HPI  71 year old female former smoker followed for severe COPD and oxygen dependent respiratory failure, OSA   I had seen her last in 2016, she was started on CPAP of 13 cm with good results as noted on a follow-up visit with good compliance.  However it seems that she has stopped using her CPAP and this was taken away by DME for unclear reasons-she states that when she was put on continuous oxygen she was told that she did not need a CPAP but I do not see any record of that.  She presents for an acute visit today with increased shortness of breath and wheezing.  She has tried multiple combinations in the past but lately seems to be on Stiolto and just received samples from our office a few days ago.  She is compliant with this. Denies sputum production, no preceding URI symptoms, no environmental exposure  Due to period of stability she had decreased her neb usage to twice a day  Significant tests/ events reviewed  PFT 6/ 2017:  FEV1 0.88 L/47% and 0.69 L/37% later in June 2017 CT chest lung cancer screening 10/09/2015: Emphysema with very small lung nodules Echo January 2016: Shows grade 1 diastolic dysfunction Nuclear medicine cardiac stress to 12/11/2015: Normal tests Lexiscan Myoview with ejection fraction 75% PSG 12/2014 >AHI 35/h    Past Medical History:  Diagnosis Date  . Abdominal aneurysm ([place])    Dr. Oneida Alar follows lLOV 2 ''17 per pt "around 2 cm"  . Anemia    as a child  . Arthritis    left ankle, right knee, right SI joint, wrists, lower back  . COPD (chronic obstructive pulmonary disease) ([place])    ephysema-Dr. Chase Caller  . Dyspnea   . Headache    as a child would have terrible headaches during season changes  . 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 Hyperlipidemia   . Hypertension   . Multiple thyroid nodules   . Murmur, cardiac 1950  . Pneumonia   .  Pre-diabetes   . Requires continuous at home supplemental oxygen    2 L 24/7  . Sebaceous cyst    hairline sebaceous cyst left posterior neck to be excised 04-13-16 by Dr. Harlow Asa in [facility]  . Sleep apnea    cpap used sometimes, uses oxygen concentrator 2 l/m nasally bedtime  . Varicella as child     Review of Systems neg for any significant sore throat, dysphagia, itching, sneezing, nasal congestion or excess/ purulent secretions, fever, chills, sweats, unintended wt loss, pleuritic or exertional cp, hempoptysis, orthopnea pnd or change in chronic leg swelling.   Also denies presyncope, palpitations, heartburn, abdominal pain, nausea, vomiting, diarrhea or change in bowel or urinary habits, dysuria,hematuria, rash, arthralgias, visual complaints, headache, numbness weakness or ataxia.     Objective:   Physical Exam   Gen. Ple[place], obese, in no distress ENT - no lesions, no post nasal drip Neck: No JVD, no thyromegaly, no carotid bruits Lungs: no use of accessory muscles, no dullness to percussion, decreased BS with faint BL or rhonchi  Cardiovascular: Rhythm regular, heart sounds  normal, no murmurs or gallops, no peripheral edema Musculoskeletal: No deformities, no cyanosis or clubbing , no tremors         Assessment & Plan:

## 2017-04-13 NOTE — Patient Instructions (Signed)
Prednisone 10 mg tabs Take 4 tabs  daily with food x 4 days, then 3 tabs daily x 4 days, then 2 tabs daily x 4 days, then 1 tab daily x4 days then stop. #40   We will send Rx for CPAP 13 cm to [facility], mask of choice, humidity

## 2017-04-13 NOTE — Assessment & Plan Note (Addendum)
Treat as exacerbation but does not seem to have infective component to her preceding URI symptoms and is no antibiotic necessary  Prednisone 10 mg tabs Take 4 tabs  daily with food x 4 days, then 3 tabs daily x 4 days, then 2 tabs daily x 4 days, then 1 tab daily x4 days then stop. #40  Stay on stiolto Increase duonebs to 4/d

## 2017-04-13 NOTE — Assessment & Plan Note (Addendum)
Unclear why CPAP was disconintued  We will send Rx for CPAP 13 cm to [facility], mask of choice, humidity Break in therapy -may need retesting  Explained that in overlap syndrome, CPAP is associated with reduce mortality

## 2017-04-14 NOTE — Assessment & Plan Note (Signed)
Oxygen requirements did not seem to worsen, continue 2 L continuous

## 2017-04-14 NOTE — Telephone Encounter (Signed)
Checked CMM, still waiting on a determination. Will check back later.

## 2017-04-15 NOTE — Telephone Encounter (Signed)
I checked CMM and this is the response  Wait for Determination Please wait for [employer] to return a determination.

## 2017-04-18 ENCOUNTER — Emergency Department (HOSPITAL_COMMUNITY)
Admission: EM | Admit: 2017-04-18 | Discharge: 2017-04-18 | Disposition: A | Discharge status: Home / Self Care | Payer: No Typology Code available for payment source | Attending: Emergency Medicine | Admitting: Emergency Medicine

## 2017-04-18 ENCOUNTER — Encounter (HOSPITAL_COMMUNITY): Payer: Self-pay | Admitting: Emergency Medicine

## 2017-04-18 ENCOUNTER — Emergency Department (HOSPITAL_COMMUNITY): Payer: No Typology Code available for payment source

## 2017-04-18 ENCOUNTER — Other Ambulatory Visit: Payer: Self-pay

## 2017-04-18 DIAGNOSIS — Z87891 Personal history of nicotine dependence: Secondary | ICD-10-CM | POA: Insufficient documentation

## 2017-04-18 DIAGNOSIS — Y929 Unspecified place or not applicable: Secondary | ICD-10-CM | POA: Insufficient documentation

## 2017-04-18 DIAGNOSIS — Y999 Unspecified external cause status: Secondary | ICD-10-CM | POA: Diagnosis not present

## 2017-04-18 DIAGNOSIS — Z79899 Other long term (current) drug therapy: Secondary | ICD-10-CM | POA: Insufficient documentation

## 2017-04-18 DIAGNOSIS — E119 Type 2 diabetes mellitus without complications: Secondary | ICD-10-CM | POA: Insufficient documentation

## 2017-04-18 DIAGNOSIS — R079 Chest pain, unspecified: Secondary | ICD-10-CM | POA: Diagnosis not present

## 2017-04-18 DIAGNOSIS — I10 Essential (primary) hypertension: Secondary | ICD-10-CM | POA: Insufficient documentation

## 2017-04-18 DIAGNOSIS — J449 Chronic obstructive pulmonary disease, unspecified: Secondary | ICD-10-CM | POA: Insufficient documentation

## 2017-04-18 DIAGNOSIS — S2190XA Unspecified open wound of unspecified part of thorax, initial encounter: Secondary | ICD-10-CM | POA: Diagnosis not present

## 2017-04-18 DIAGNOSIS — Y939 Activity, unspecified: Secondary | ICD-10-CM | POA: Diagnosis not present

## 2017-04-18 DIAGNOSIS — S299XXA Unspecified injury of thorax, initial encounter: Secondary | ICD-10-CM | POA: Diagnosis not present

## 2017-04-18 MED ORDER — TRAMADOL HCL 50 MG PO TABS: 50.0000 mg | ORAL_TABLET | Freq: Four times a day (QID) | ORAL | 0 refills | Status: DC | PRN

## 2017-04-18 MED ORDER — IBUPROFEN 200 MG PO TABS
600.0000 mg | ORAL_TABLET | Freq: Once | ORAL | Status: AC
  Administered 2017-04-18: 600 mg via ORAL
  Filled 2017-04-18: qty 3

## 2017-04-18 NOTE — Telephone Encounter (Signed)
Checked CMM and the same response was received  Please wait for [employer] to return a determination
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
## 2017-04-20 NOTE — Telephone Encounter (Signed)
Determination Favorable 20 hours ago  [facility] and they are not open at this time. Will try again later.   Called pt to let her know and she stated she will call the pharmacy to check status. Nothing further is needed.

## 2017-04-22 ENCOUNTER — Other Ambulatory Visit: Payer: Self-pay

## 2017-04-27 ENCOUNTER — Ambulatory Visit: Payer: PPO | Admitting: Internal Medicine

## 2017-05-13 ENCOUNTER — Telehealth: Payer: Self-pay | Admitting: Internal Medicine

## 2017-05-13 DIAGNOSIS — J449 Chronic obstructive pulmonary disease, unspecified: Secondary | ICD-10-CM | POA: Diagnosis not present

## 2017-05-13 MED ORDER — PREDNISONE 10 MG PO TABS: ORAL_TABLET | ORAL | 0 refills | Status: DC

## 2017-05-13 NOTE — Telephone Encounter (Signed)
Called spoke with patient, advised of CY's recommendations Pt voiced her understanding and will call the office next week if her symptoms do not improve Rx sent to verified pharmacy  Nothing further needed; will sign off

## 2017-05-13 NOTE — Telephone Encounter (Signed)
Offer prednisone 10 mg, # 20, 4 X 2 DAYS, 3 X 2 DAYS, 2 X 2 DAYS, 1 X 2 DAYS  Call next week if no better

## 2017-05-13 NOTE — Telephone Encounter (Signed)
Called and spoke with pt. Pt reports of wheezing, chest tightness, increased sob with laying flat & non prod cough x2d. Pt states she took neb treatment last night with mild improvement.  Pt is requesting apt or prednisone taper.   CY please advise, as MR is not available.  Current Outpatient Medications on File Prior to Visit  Medication Sig Dispense Refill  . Albuterol Sulfate (PROAIR RESPICLICK) 425 (90 Base) MCG/ACT AEPB Inhale 1-2 puffs into the lungs every 6 (six) hours as needed. 1 each 5  . amLODipine (NORVASC) 10 MG tablet Take 1 tablet (10 mg total) by mouth daily. 90 tablet 3  . Aspirin-Calcium Carbonate (BAYER WOMENS) 81-300 MG TABS Take 1 tablet by mouth daily with breakfast.    . atorvastatin (LIPITOR) 40 MG tablet Take 1 tablet (40 mg total) by mouth daily. Takes daily AM 90 tablet 3  . cholecalciferol (VITAMIN D) 1000 units tablet Take 2,000 Units by mouth daily.    . Fluticasone Furoate (ARNUITY ELLIPTA) 100 MCG/ACT AEPB Inhale 1 puff into the lungs daily. 30 each 11  . hydrochlorothiazide (HYDRODIURIL) 25 MG tablet Take 1 tablet (25 mg total) by mouth daily. 90 tablet 3  . ipratropium-albuterol (DUONEB) 0.5-2.5 (3) MG/3ML SOLN USE ONE VIAL IN NEBULIZER 4 TIMES DAILY 360 mL 5  . OXYGEN Inhale 3 L into the lungs. When exerting self     . potassium chloride SA (K-DUR,KLOR-CON) 20 MEQ tablet Take 1 tablet (20 mEq total) by mouth 2 (two) times daily. 180 tablet 1  . predniSONE (DELTASONE) 10 MG tablet Take 4 tabs by mouth once daily x4 days, then 3 tabs x4 days, 2 tabs x4 days, 1 tab x4 days and stop. 40 tablet 0  . sodium chloride (OCEAN) 0.65 % SOLN nasal spray Place 1 spray into both nostrils as needed (dryness).    . Tiotropium Bromide-Olodaterol (STIOLTO RESPIMAT) 2.5-2.5 MCG/ACT AERS Inhale 2 puffs 2 (two) times daily into the lungs. 1 Inhaler 3  . Tiotropium Bromide-Olodaterol (STIOLTO RESPIMAT) 2.5-2.5 MCG/ACT AERS Inhale 2 puffs into the lungs daily. 3 Inhaler 0  .  tiZANidine (ZANAFLEX) 4 MG tablet Take 1 tablet (4 mg total) by mouth every 8 (eight) hours as needed for muscle spasms. 60 tablet 5  . traMADol (ULTRAM) 50 MG tablet Take 1 tablet (50 mg total) by mouth every 6 (six) hours as needed. 15 tablet 0  . traMADol (ULTRAM) 50 MG tablet Take 1 tablet (50 mg total) by mouth every 6 (six) hours as needed. 15 tablet 0   No current facility-administered medications on file prior to visit.     Allergies  Allergen Reactions  . Nickel Rash    Severe rash to infection: pt is allergic to all metals other than sterling silver or gold jewelry.   [name] [Olmesartan] Swelling    Swelling of face and arms   . [name] [Valsartan] Swelling    Swelling of face and arms   . Lisinopril Cough  . Codeine Other (See Comments)    jittery  . Hydrocodone-Acetaminophen Nausea And Vomiting  . Monosodium Glutamate Other (See Comments)    Facial swelling per pt  . Lead Acetate Rash

## 2017-05-16 ENCOUNTER — Ambulatory Visit (HOSPITAL_COMMUNITY)
Admission: EM | Admit: 2017-05-16 | Discharge: 2017-05-16 | Disposition: A | Discharge status: Home / Self Care | Payer: PPO | Attending: Emergency Medicine | Admitting: Emergency Medicine

## 2017-05-16 ENCOUNTER — Encounter (HOSPITAL_COMMUNITY): Payer: Self-pay | Admitting: Emergency Medicine

## 2017-05-16 ENCOUNTER — Other Ambulatory Visit: Payer: Self-pay

## 2017-05-16 DIAGNOSIS — K047 Periapical abscess without sinus: Secondary | ICD-10-CM | POA: Diagnosis not present

## 2017-05-16 MED ORDER — TRAMADOL HCL 50 MG PO TABS: 50.0000 mg | ORAL_TABLET | Freq: Four times a day (QID) | ORAL | 0 refills | Status: AC | PRN

## 2017-05-16 MED ORDER — AMOXICILLIN-POT CLAVULANATE 875-125 MG PO TABS: 1.0000 | ORAL_TABLET | Freq: Two times a day (BID) | ORAL | 0 refills | Status: AC

## 2017-05-16 NOTE — Discharge Instructions (Signed)
Augmentin given for infection- take twice a day for 5 days  Tramadol 50 mg for severe pain, continue tylenol [phone] mg   Please return if pain increasing, vision changes, worsening headache.  Please follow up Jan 9th with dental appointment.

## 2017-05-16 NOTE — ED Triage Notes (Signed)
Pt states "the gums on the right side of my face is swollen and painful, I took some tylenol this morning I rinsed my mouth with peroxide and listerine with a little relief.

## 2017-05-16 NOTE — ED Provider Notes (Signed)
[facility]    CSN: [id number] Arrival date & time: 05/16/17  1642     History   Chief Complaint Chief Complaint  Patient presents with  . Dental Pain  . Oral Swelling    HPI Victoria [name] is a 71 y.o. female presenting with dental and facial pain for 1 day. She has swelling to her right maxillary area with associated dental pain. She had a similar issue back in October and it cleared up with antibiotics. She has an appointment Jan 9th to see dentist about tooth. No fever, no headache, no vision changes. Has tried peroxide and Listerine mouth washes.   HPI  Past Medical History:  Diagnosis Date  . Abdominal aneurysm ([place])    Dr. Oneida Alar follows lLOV 2 ''17 per pt "around 2 cm"  . Anemia    as a child  . Arthritis    left ankle, right knee, right SI joint, wrists, lower back  . COPD (chronic obstructive pulmonary disease) ([place])    ephysema-Dr. Chase Caller  . Dyspnea   . Headache    as a child would have terrible headaches during season changes  . 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 Hyperlipidemia   . Hypertension   . Multiple thyroid nodules   . Murmur, cardiac 1950  . Pneumonia   . Pre-diabetes   . Requires continuous at home supplemental oxygen    2 L 24/7  . Sebaceous cyst    hairline sebaceous cyst left posterior neck to be excised 04-13-16 by Dr. Harlow Asa in [facility]  . Sleep apnea    cpap used sometimes, uses oxygen concentrator 2 l/m nasally bedtime  . Varicella as child    Patient Active Problem List   Diagnosis Date Noted  . Chronic respiratory failure with hypoxia ([place]) 02/07/2017  . DDD (degenerative disc disease), lumbar 01/13/2017  . Estrogen deficiency 10/20/2016  . Type II diabetes mellitus with manifestations ([place]) 01/08/2016  . Visit for screening mammogram 10/14/2015  . Atherosclerosis of native coronary artery of native heart without angina pectoris 10/14/2015  . History of positive PPD 10/08/2015  . COPD, severe  ([place]) 09/04/2015  . AAA (abdominal aortic aneurysm) without rupture ([place]) 05/13/2015  . Nontoxic multinodular goiter 05/13/2015  . OSA (obstructive sleep apnea) 11/15/2014  . Hypokalemia 09/05/2014  . Essential hypertension 03/11/2014  . Osteoporosis 02/11/2014  . Routine health maintenance 12/07/2011  . DJD (degenerative joint disease) of knee   . Hyperlipidemia with target LDL less than 100     Past Surgical History:  Procedure Laterality Date  . APPENDECTOMY     2008  . [place]  . COLONOSCOPY    . COLONOSCOPY WITH PROPOFOL N/A 04/16/2016   Procedure: COLONOSCOPY WITH PROPOFOL;  Surgeon: Carol Ada, MD;  Location: [facility];  Service: Endoscopy;  Laterality: N/A;  . CYST REMOVAL NECK Left 04/13/2016   Procedure: EXCISION OF SEBACEOUS CYST LEFT POSTERIOR NECK;  Surgeon: Armandina Gemma, MD;  Location: [facility];  Service: General;  Laterality: Left;  . Excision of Pelvic Absess, Right Ovary     2008  . TUBAL LIGATION  1980    OB History    No data available       Home Medications    Prior to Admission medications   Medication Sig Start Date End Date Taking? Authorizing Provider  Albuterol Sulfate (PROAIR RESPICLICK) 244 (90 Base) MCG/ACT AEPB Inhale 1-2 puffs into the lungs  every 6 (six) hours as needed. 02/07/17   Parrett, Fonnie Mu, NP  amLODipine (NORVASC) 10 MG tablet Take 1 tablet (10 mg total) by mouth daily. 06/23/16   Janith Lima, MD  amoxicillin-clavulanate (AUGMENTIN) 875-125 MG tablet Take 1 tablet by mouth every 12 (twelve) hours for 5 days. 05/16/17 05/21/17  Wieters, Hallie C, PA-C  Aspirin-Calcium Carbonate (BAYER WOMENS) 81-300 MG TABS Take 1 tablet by mouth daily with breakfast.    [provider]  atorvastatin (LIPITOR) 40 MG tablet Take 1 tablet (40 mg total) by mouth daily. Takes daily AM 06/23/16   Janith Lima, MD  cholecalciferol (VITAMIN D) 1000 units tablet Take 2,000 Units by mouth daily.    [provider]    Fluticasone Furoate (ARNUITY ELLIPTA) 100 MCG/ACT AEPB Inhale 1 puff into the lungs daily. 06/23/16   Janith Lima, MD  hydrochlorothiazide (HYDRODIURIL) 25 MG tablet Take 1 tablet (25 mg total) by mouth daily. 02/22/17   Janith Lima, MD  ipratropium-albuterol (DUONEB) 0.5-2.5 (3) MG/3ML SOLN USE ONE VIAL IN NEBULIZER 4 TIMES DAILY 02/07/17   Parrett, Fonnie Mu, NP  OXYGEN Inhale 3 L into the lungs. When exerting self     [provider]  potassium chloride SA (K-DUR,KLOR-CON) 20 MEQ tablet Take 1 tablet (20 mEq total) by mouth 2 (two) times daily. 01/25/17   Janith Lima, MD  predniSONE (DELTASONE) 10 MG tablet Take 4 tabs for 2 days, then 3 tabs for 2 days, 2 tabs for 2 days, then 1 tab for 2 days, then stop. 05/13/17   Baird Lyons D, MD  sodium chloride (OCEAN) 0.65 % SOLN nasal spray Place 1 spray into both nostrils as needed (dryness).    [provider]  Tiotropium Bromide-Olodaterol (STIOLTO RESPIMAT) 2.5-2.5 MCG/ACT AERS Inhale 2 puffs 2 (two) times daily into the lungs. 04/05/17   Brand Males, MD  Tiotropium Bromide-Olodaterol (STIOLTO RESPIMAT) 2.5-2.5 MCG/ACT AERS Inhale 2 puffs into the lungs daily. 04/06/17   Brand Males, MD  tiZANidine (ZANAFLEX) 4 MG tablet Take 1 tablet (4 mg total) by mouth every 8 (eight) hours as needed for muscle spasms. 01/13/17   Janith Lima, MD  traMADol (ULTRAM) 50 MG tablet Take 1 tablet (50 mg total) by mouth every 6 (six) hours as needed for up to 2 days for severe pain. 05/16/17 05/18/17  Wieters, Elesa Hacker, PA-C    Family History Family History  Problem Relation Age of Onset  . Heart disease Mother        MI - fatal  . Hypertension Mother   . Stroke Father 19       fatal  . Alzheimer's disease Father   . Alzheimer's disease Brother   . Hyperlipidemia Brother   . Hypertension Brother   . Diabetes Brother   . Hypertension Brother   . Hyperlipidemia Brother     Social History Social History   Tobacco Use   . Smoking status: Former Smoker    Packs/day: 1.00    Years: 50.00    Pack years: 50.00    Types: Cigarettes    Last attempt to quit: 04/16/2011    Years since quitting: 6.0  . Smokeless tobacco: Never Used  . Tobacco comment: quit that date when she had to go to ER   Substance Use Topics  . Alcohol use: Yes    Alcohol/week: 0.0 oz    Comment: rare occasion  . Drug use: No    Comment: no marijuana  since 2012     Allergies   Nickel; Benicar [olmesartan]; [name] [valsartan]; Lisinopril; Codeine; Hydrocodone-acetaminophen; Monosodium glutamate; and Lead acetate   Review of Systems Review of Systems  Constitutional: Negative for fatigue and fever.  HENT: Positive for dental problem. Negative for congestion, drooling and trouble swallowing.   Eyes: Negative for pain and visual disturbance.  Respiratory: Negative for shortness of breath.   Cardiovascular: Negative for chest pain.  Neurological: Negative for dizziness, speech difficulty, light-headedness and headaches.     Physical Exam Triage Vital Signs ED Triage Vitals  Enc Vitals Group     BP 05/16/17 1726 (!) 171/71     Pulse Rate 05/16/17 1726 (!) 105     Resp 05/16/17 1726 (!) 22     Temp 05/16/17 1726 98.3 F (36.8 C)     Temp src --      SpO2 05/16/17 1726 94 %     Weight --      Height --      Head Circumference --      Peak Flow --      Pain Score 05/16/17 1727 6     Pain Loc --      Pain Edu? --      Excl. in [place]? --    No data found.  Updated Vital Signs BP (!) 171/71   Pulse (!) 105   Temp 98.3 F (36.8 C)   Resp (!) 22   SpO2 94%   Visual Acuity Right Eye Distance:   Left Eye Distance:   Bilateral Distance:    Right Eye Near:   Left Eye Near:    Bilateral Near:     Physical Exam  Constitutional: She appears well-developed and well-nourished. No distress.  HENT:  Head: Normocephalic and atraumatic.  Mouth/Throat:    Mild swelling and erythema on right maxillary area, compared to  left; mild swelling to gums, tender to palpation of gums.  Eyes: Conjunctivae are normal.  Neck: Neck supple.  Cardiovascular: Normal rate.  No murmur heard. Pulmonary/Chest: Effort normal. No respiratory distress.  Abdominal: Soft. There is no tenderness.  Musculoskeletal: She exhibits no edema.  Neurological: She is alert.  Skin: Skin is warm and dry.  Psychiatric: She has a normal mood and affect.  Nursing note and vitals reviewed.    UC Treatments / Results  Labs (all labs ordered are listed, but only abnormal results are displayed) Labs Reviewed - No data to display  EKG  EKG Interpretation None       Radiology No results found.  Procedures Procedures (including critical care time)  Medications Ordered in UC Medications - No data to display   Initial Impression / Assessment and Plan / UC Course  I have reviewed the triage vital signs and the nursing notes.  Pertinent labs & imaging results that were available during my care of the patient were reviewed by me and considered in my medical decision making (see chart for details).     Patient given Augmentin and Tramadol for pain- states she does not have any allergies or reactions to tramadol, tolerated well in the past.  Discussed return precautions to include increased swelling, vision changes, headache, increased pain, swelling near eye go to emergency room. Patient verbalized understanding and is agreeable with plan.  Follow up Jan 9th with dentist.    Final Clinical Impressions(s) / UC Diagnoses   Final diagnoses:  Dental infection    ED Discharge Orders  Ordered    amoxicillin-clavulanate (AUGMENTIN) 875-125 MG tablet  Every 12 hours     05/16/17 1856    traMADol (ULTRAM) 50 MG tablet  Every 6 hours PRN     05/16/17 1859       Controlled Substance Prescriptions [place] Controlled Substance Registry consulted? Not Applicable   Janith Lima, [place] 05/16/17 1955

## 2017-05-25 DIAGNOSIS — G4733 Obstructive sleep apnea (adult) (pediatric): Secondary | ICD-10-CM | POA: Diagnosis not present

## 2017-05-27 ENCOUNTER — Telehealth: Payer: Self-pay | Admitting: Internal Medicine

## 2017-05-27 NOTE — Telephone Encounter (Signed)
Called pt and advised I left samples up from of Victoria [name]. Pt understood.

## 2017-05-28 ENCOUNTER — Telehealth: Payer: Self-pay | Admitting: Pulmonary Disease

## 2017-05-28 MED ORDER — PREDNISONE 10 MG PO TABS: 20.0000 mg | ORAL_TABLET | Freq: Every day | ORAL | 0 refills | Status: DC

## 2017-05-28 NOTE — Telephone Encounter (Signed)
Victoria [name] reports worsening breathing overnight.  She has been compliant with her CPAP and Stiolto. She used albuterol this morning.  She requested prednisone.  Rx sent for 20mg  prednisone daily x 5 days.  Advised she use albuterol prn.  Call if no improvement.

## 2017-05-30 ENCOUNTER — Telehealth: Payer: Self-pay | Admitting: Internal Medicine

## 2017-05-30 MED ORDER — PREDNISONE 10 MG PO TABS: ORAL_TABLET | ORAL | 0 refills | Status: DC

## 2017-05-30 NOTE — Telephone Encounter (Signed)
Called in Rx for prednisone for asthma flare

## 2017-05-30 NOTE — Telephone Encounter (Signed)
Pt called in on Saturday and was prescribed Prednisone 20mg  tabs #5 1 po qd.  Pt has already taken all of this prescription.  States her SOB, nonprod cough is not improved by prednisone. Denies mucus production, fever, chills, body aches, chest pain.   Has also been using albuterol to help with dyspnea.  Pt is requesting additional prednisone.    Uses [facility] on [address].    MR please advise.  Thanks.

## 2017-05-30 NOTE — Telephone Encounter (Signed)
Spoke with pt, aware of recs.  States that her dentist has already called in an abx for a dental issue, so she does not want abx called in.  Sent in pred taper.  Nothing further needed at this time.

## 2017-05-30 NOTE — Telephone Encounter (Signed)
Allergies  Allergen Reactions  . Nickel Rash    Severe rash to infection: pt is allergic to all metals other than sterling silver or gold jewelry.   [name] [Olmesartan] Swelling    Swelling of face and arms   . [name] [Valsartan] Swelling    Swelling of face and arms   . Lisinopril Cough  . Codeine Other (See Comments)    jittery  . Hydrocodone-Acetaminophen Nausea And Vomiting  . Monosodium Glutamate Other (See Comments)    Facial swelling per pt  . Lead Acetate Rash     Take doxycycline 100mg  po twice daily x 5 days; take after meals and avoid sunlight   Take prednisone 40 mg daily x 2 days, then 20mg  daily x 2 days, then 10mg  daily x 2 days, then 5mg  daily x 2 days and stop

## 2017-06-06 NOTE — Progress Notes (Addendum)
Subjective:   Victoria [name] is a 72 y.o. female who presents for Medicare Annual (Subsequent) preventive examination.  Review of Systems:  No ROS.  Medicare Wellness Visit. Additional risk factors are reflected in the social history.  Cardiac Risk Factors include: advanced age (>37men, >53 women);diabetes mellitus;dyslipidemia;hypertension Sleep patterns: feels rested on waking, gets up 1-2 times nightly to void and sleeps 6-7 hours nightly.   Home Safety/Smoke Alarms: Feels safe in home. Smoke alarms in place.  Living environment; residence and Firearm Safety: 1-story house/ trailer, equipment: Hydrologist, Type: Tub Surveyor, quantity, no firearms. Lives alone, no needs for DME, good support system Seat Belt Safety/Bike Helmet: Wears seat belt.     Objective:     Vitals: BP 130/80   Pulse 79   Resp 18   Ht 5\' 5"  (1.651 m)   Wt 214 lb (97.1 kg)   SpO2 98%   BMI 35.61 kg/m   Body mass index is 35.61 kg/m.  Advanced Directives 06/07/2017 04/18/2017 02/23/2017 07/14/2016 07/08/2016 04/16/2016 04/13/2016  Does Patient Have a Medical Advance Directive? Yes No Yes Yes Yes No No  Type of Paramedic of [place];Living will - Healthcare Power of [place] - -  Does patient want to make changes to medical advance directive? - - - - - - -  Copy of [place] in Chart? No - copy requested - No - copy requested No - copy requested - - -  Would patient like information on creating a medical advance directive? - No - Patient declined - - - No - Patient declined No - Patient declined    Tobacco Social History   Tobacco Use  Smoking Status Former Smoker  . Packs/day: 1.00  . Years: 50.00  . Pack years: 50.00  . Types: Cigarettes  . Last attempt to quit: 04/16/2011  . Years since quitting: 6.1  Smokeless Tobacco Never Used  Tobacco Comment   quit that date when she had to go to ER        Counseling given: Not Answered Comment: quit that date when she had to go to ER   Past Medical History:  Diagnosis Date  . Abdominal aneurysm ([place])    Dr. Oneida Alar follows lLOV 2 ''17 per pt "around 2 cm"  . Anemia    as a child  . Arthritis    left ankle, right knee, right SI joint, wrists, lower back  . COPD (chronic obstructive pulmonary disease) ([place])    ephysema-Dr. Chase Caller  . Dyspnea   . Headache    as a child would have terrible headaches during season changes  . 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 Hyperlipidemia   . Hypertension   . Multiple thyroid nodules   . Murmur, cardiac 1950  . Pneumonia   . Pre-diabetes   . Requires continuous at home supplemental oxygen    2 L 24/7  . Sebaceous cyst    hairline sebaceous cyst left posterior neck to be excised 04-13-16 by Dr. Harlow Asa in [facility]  . Sleep apnea    cpap used sometimes, uses oxygen concentrator 2 l/m nasally bedtime  . Varicella as child   Past Surgical History:  Procedure Laterality Date  . APPENDECTOMY     2008  . [place]  . COLONOSCOPY    . COLONOSCOPY WITH PROPOFOL N/A 04/16/2016   Procedure: COLONOSCOPY  WITH PROPOFOL;  Surgeon: Carol Ada, MD;  [facility];  Service: Endoscopy;  Laterality: N/A;  . CYST REMOVAL NECK Left 04/13/2016   Procedure: EXCISION OF SEBACEOUS CYST LEFT POSTERIOR NECK;  Surgeon: Armandina Gemma, MD;  Location: [facility];  Service: General;  Laterality: Left;  . Excision of Pelvic Absess, Right Ovary     2008  . TUBAL LIGATION  1980   Family History  Problem Relation Age of Onset  . Heart disease Mother        MI - fatal  . Hypertension Mother   . Stroke Father 68       fatal  . Alzheimer's disease Father   . Alzheimer's disease Brother   . Hyperlipidemia Brother   . Hypertension Brother   . Diabetes Brother   . Hypertension Brother   . Hyperlipidemia Brother    Social History   Socioeconomic History  . Marital status:  Divorced    Spouse name: None  . Number of children: 1  . Years of education: 74  . Highest education level: None  Social Needs  . Financial resource strain: Somewhat hard  . Food insecurity - worry: Sometimes true  . Food insecurity - inability: Sometimes true  . Transportation needs - medical: No  . Transportation needs - non-medical: No  Occupational History  . Occupation: Social worker, Technical sales engineer: [employer]  Tobacco Use  . Smoking status: Former Smoker    Packs/day: 1.00    Years: 50.00    Pack years: 50.00    Types: Cigarettes    Last attempt to quit: 04/16/2011    Years since quitting: 6.1  . Smokeless tobacco: Never Used  . Tobacco comment: quit that date when she had to go to ER   Substance and Sexual Activity  . Alcohol use: Yes    Alcohol/week: 0.0 oz    Comment: rare occasion  . Drug use: No    Comment: no marijuana since 2012  . Sexual activity: No  Other Topics Concern  . None  Social History Narrative   HSG; [employer], [employer]. . Married '69 - 9 yrs/divorced. 1 son - '72; no grandchildren.   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 No h/o physical or sexual abuse. ACP - no living will - wants information. Provided packet of information. On 08/05/2011: she stated she was distant cousins to celebrities [name] and [name]       Outpatient Encounter Medications as of 06/07/2017  Medication Sig  . Albuterol Sulfate (PROAIR RESPICLICK) 706 (90 Base) MCG/ACT AEPB Inhale 1-2 puffs into the lungs every 6 (six) hours as needed.  [name] amLODipine (NORVASC) 10 MG tablet Take 1 tablet (10 mg total) by mouth daily.  [name] atorvastatin (LIPITOR) 40 MG tablet Take 1 tablet (40 mg total) by mouth daily. Takes daily AM  . cholecalciferol (VITAMIN D) 1000 units tablet Take 2,000 Units by mouth daily.  . hydrochlorothiazide (HYDRODIURIL) 25 MG tablet Take 1 tablet (25 mg total) by mouth daily.  [name]  ipratropium-albuterol (DUONEB) 0.5-2.5 (3) MG/3ML SOLN USE ONE VIAL IN NEBULIZER 4 TIMES DAILY  . OXYGEN Inhale 3 L into the lungs. When exerting self   . potassium chloride SA (K-DUR,KLOR-CON) 20 MEQ tablet Take 1 tablet (20 mEq total) by mouth 2 (two) times daily.  . sodium chloride (OCEAN) 0.65 % SOLN nasal spray Place 1 spray into both nostrils as needed (dryness).  . Tiotropium Bromide-Olodaterol (STIOLTO RESPIMAT) 2.5-2.5 MCG/ACT  AERS Inhale 2 puffs into the lungs daily.  [name] tiZANidine (ZANAFLEX) 4 MG tablet Take 1 tablet (4 mg total) by mouth every 8 (eight) hours as needed for muscle spasms.  . [DISCONTINUED] Aspirin-Calcium Carbonate (BAYER WOMENS) 81-300 MG TABS Take 1 tablet by mouth daily with breakfast.  . [DISCONTINUED] Fluticasone Furoate (ARNUITY ELLIPTA) 100 MCG/ACT AEPB Inhale 1 puff into the lungs daily. (Patient not taking: Reported on 06/07/2017)  . [DISCONTINUED] predniSONE (DELTASONE) 10 MG tablet 4 tabs daily X2 days, 2 tabs daily X2 days, 1 tab daily X2 days, 0.5 tab daily X2 days. (Patient not taking: Reported on 06/07/2017)  . [DISCONTINUED] Tiotropium Bromide-Olodaterol (STIOLTO RESPIMAT) 2.5-2.5 MCG/ACT AERS Inhale 2 puffs 2 (two) times daily into the lungs. (Patient not taking: Reported on 06/07/2017)   No facility-administered encounter medications on file as of 06/07/2017.     Activities of Daily Living In your present state of health, do you have any difficulty performing the following activities: 06/07/2017  Hearing? N  Vision? N  Difficulty concentrating or making decisions? N  Walking or climbing stairs? N  Dressing or bathing? N  Doing errands, shopping? N  Preparing Food and eating ? N  Using the Toilet? N  In the past six months, have you accidently leaked urine? N  Do you have problems with loss of bowel control? N  Managing your Medications? N  Managing your Finances? N  Housekeeping or managing your Housekeeping? N  Some recent data might be hidden     Patient Care Team: Janith Lima, MD as PCP - General (Internal Medicine) Jackolyn Confer, MD (General Surgery) Arvella Nigh, MD (Obstetrics and Gynecology) Brand Males, MD as Consulting Physician (Pulmonary Disease) Arvella Nigh, MD as Consulting Physician (Obstetrics and Gynecology) Renato Shin, MD as Consulting Physician (Endocrinology)    Assessment:   This is a routine wellness examination for Ajanay. Physical assessment deferred to PCP.   Exercise Activities and Dietary recommendations Current Exercise Habits: Home exercise routine, Type of exercise: walking(dancing), Time (Minutes): 30, Frequency (Times/Week): 4, Weekly Exercise (Minutes/Week): 120, Intensity: Mild, Exercise limited by: orthopedic condition(s) Diet (meal preparation, eat out, water intake, caffeinated beverages, dairy products, fruits and vegetables): in general, a "healthy" diet  , well balanced   Reviewed heart healthy and diabetic diet, encouraged patient to increase daily water intake. Relevant patient education assigned to patient using Emmi.  Goals    . Exercise 3x per week (30 min per time)     Will try to get up to go with dtr biw; at 6:45 to participate in swim class Pulmonary rehab is also monitoring exercise and tolerance; so the patient will know her limits Also discussed; weight bearing in the pool; assist osteo prevention     . 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 Continue to go to church and worship God. Eat health, exercise enjoy life and family       Fall Risk Fall Risk  06/07/2017 02/23/2016 04/07/2015 10/29/2014 10/07/2014  Falls in the past year? No No No No No    Depression Screen PHQ 2/9 Scores 06/07/2017 02/23/2016 04/07/2015 01/28/2015  PHQ - 2 Score 0 1 2 0  PHQ- 9 Score 0 - 5 -     Cognitive Function MMSE - Mini Mental State Exam 06/07/2017 10/29/2014  Not completed: - Unable to complete  Orientation to time 5 -   Orientation to Place 5 -  Registration 3 -  Attention/ Calculation 5 -  Recall 2 -  Language- name 2 objects 2 -  Language- repeat 1 -  Language- follow 3 step command 3 -  Language- read & follow direction 1 -  Write a sentence 1 -  Copy design 1 -  Total score 29 -        Immunization History  Administered Date(s) Administered  . Influenza Split 01/16/2011  . Influenza, High Dose Seasonal PF 01/13/2017  . Influenza,inj,Quad PF,6+ Mos 02/01/2013, 02/27/2014, 04/07/2015, 01/08/2016  . Pneumococcal Conjugate-13 02/01/2013  . Pneumococcal Polysaccharide-23 10/29/2014  . Td 12/06/2011   Screening Tests Health Maintenance  Topic Date Due  . OPHTHALMOLOGY EXAM  01/29/1956  . MAMMOGRAM  01/15/2017  . FOOT EXAM  02/09/2017  . HEMOGLOBIN A1C  07/14/2017  . URINE MICROALBUMIN  01/18/2018  . TETANUS/TDAP  12/05/2021  . COLONOSCOPY  04/16/2026  . INFLUENZA VACCINE  Completed  . DEXA SCAN  Completed  . Hepatitis C Screening  Completed  . PNA vac Low Risk Adult  Completed       Plan:     Patient has an upcoming mammogram appointment scheduled 06/14/17, she states she will call Dr. Bing Plume and schedule an eye appointment and will ask both doctors to send a result report to PCP.  Continue doing brain stimulating activities (puzzles, reading, adult coloring books, staying active) to keep memory sharp.   Continue to eat heart healthy diet (full of fruits, vegetables, whole grains, lean protein, water--limit salt, fat, and sugar intake) and increase physical activity as tolerated.  I have personally reviewed and noted the following in the patient's chart:   . Medical and social history . Use of alcohol, tobacco or illicit drugs  . Current medications and supplements . Functional ability and status . Nutritional status . Physical activity . Advanced directives . List of other physicians . Vitals . Screenings to include cognitive, depression, and falls . Referrals and  appointments  In addition, I have reviewed and discussed with patient certain preventive protocols, quality metrics, and best practice recommendations. A written personalized care plan for preventive services as well as general preventive health recommendations were provided to patient.   Medical screening examination/treatment/procedure(s) were performed by non-physician practitioner and as supervising physician I was immediately available for consultation/collaboration. I agree with above. Scarlette Calico, MD   Michiel Cowboy, RN  06/07/2017

## 2017-06-07 ENCOUNTER — Ambulatory Visit (INDEPENDENT_AMBULATORY_CARE_PROVIDER_SITE_OTHER): Payer: Medicare Other | Admitting: *Deleted

## 2017-06-07 VITALS — BP 130/80 | HR 79 | Resp 18 | Ht 65.0 in | Wt 214.0 lb

## 2017-06-07 DIAGNOSIS — Z0000 Encounter for general adult medical examination without abnormal findings: Secondary | ICD-10-CM | POA: Diagnosis not present

## 2017-06-07 DIAGNOSIS — E118 Type 2 diabetes mellitus with unspecified complications: Secondary | ICD-10-CM | POA: Diagnosis not present

## 2017-06-07 NOTE — Patient Instructions (Addendum)
[email or web address]   Search for free or reduced cost services like medical care, food, job training, and more.  Continue doing brain stimulating activities (puzzles, reading, adult coloring books, staying active) to keep memory sharp.   Continue to eat heart healthy diet (full of fruits, vegetables, whole grains, lean protein, water--limit salt, fat, and sugar intake) and increase physical activity as tolerated.  Ask GYN to send mammogram results to Dr. Ronnald Ramp. Make an eye exam with Dr. Bing Plume soon and ask her to send results as well.   Victoria [name] , Thank you for taking time to come for your Medicare Wellness Visit. I appreciate your ongoing commitment to your health goals. Please review the following plan we discussed and let me know if I can assist you in the future.   These are the goals we discussed: Goals    . Exercise 3x per week (30 min per time)     Will try to get up to go with dtr biw; at 6:45 to participate in swim class Pulmonary rehab is also monitoring exercise and tolerance; so the patient will know her limits Also discussed; weight bearing in the pool; assist osteo prevention     . 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 Continue to go to church and worship God. Eat health, exercise enjoy life and family       This is a list of the screening recommended for you and due dates:  Health Maintenance  Topic Date Due  . Eye exam for diabetics  01/29/1956  . Mammogram  01/15/2017  . Complete foot exam   02/09/2017  . Hemoglobin A1C  07/14/2017  . Urine Protein Check  01/18/2018  . Tetanus Vaccine  12/05/2021  . Colon Cancer Screening  04/16/2026  . Flu Shot  Completed  . DEXA scan (bone density measurement)  Completed  .  Hepatitis C: One time screening is recommended by Center for Disease Control  (CDC) for  adults born from [birth year] through 1967.   Completed  . Pneumonia vaccines  Completed

## 2017-06-13 DIAGNOSIS — J449 Chronic obstructive pulmonary disease, unspecified: Secondary | ICD-10-CM | POA: Diagnosis not present

## 2017-06-14 DIAGNOSIS — Z1231 Encounter for screening mammogram for malignant neoplasm of breast: Secondary | ICD-10-CM | POA: Diagnosis not present

## 2017-06-17 ENCOUNTER — Other Ambulatory Visit: Payer: Self-pay | Admitting: Obstetrics and Gynecology

## 2017-06-17 DIAGNOSIS — R928 Other abnormal and inconclusive findings on diagnostic imaging of breast: Secondary | ICD-10-CM

## 2017-06-21 DIAGNOSIS — Z6822 Body mass index (BMI) 22.0-22.9, adult: Secondary | ICD-10-CM | POA: Diagnosis not present

## 2017-06-21 DIAGNOSIS — Z01419 Encounter for gynecological examination (general) (routine) without abnormal findings: Secondary | ICD-10-CM | POA: Diagnosis not present

## 2017-06-22 ENCOUNTER — Ambulatory Visit
Admission: RE | Admit: 2017-06-22 | Discharge: 2017-06-22 | Disposition: A | Discharge status: Home / Self Care | Payer: Medicare Other | Source: Ambulatory Visit | Attending: Obstetrics and Gynecology | Admitting: Obstetrics and Gynecology

## 2017-06-22 ENCOUNTER — Telehealth: Payer: Self-pay | Admitting: Internal Medicine

## 2017-06-22 ENCOUNTER — Other Ambulatory Visit: Payer: Self-pay | Admitting: Obstetrics and Gynecology

## 2017-06-22 DIAGNOSIS — R928 Other abnormal and inconclusive findings on diagnostic imaging of breast: Secondary | ICD-10-CM

## 2017-06-22 DIAGNOSIS — N6489 Other specified disorders of breast: Secondary | ICD-10-CM

## 2017-06-22 DIAGNOSIS — N6321 Unspecified lump in the left breast, upper outer quadrant: Secondary | ICD-10-CM | POA: Diagnosis not present

## 2017-06-22 NOTE — Telephone Encounter (Unsigned)
Copied from [place] [phone]. Topic: Quick Communication - See Telephone Encounter >> Jun 22, 2017  3:17 PM Percell Belt A wrote: CRM for notification. See Telephone encounter for:  pt would like to sch her 2nd prolia.  Is the ok to sch?  She was authorized for a year   06/22/17.

## 2017-06-23 ENCOUNTER — Ambulatory Visit (INDEPENDENT_AMBULATORY_CARE_PROVIDER_SITE_OTHER): Payer: Medicare Other | Admitting: Emergency Medicine

## 2017-06-23 ENCOUNTER — Encounter: Payer: Self-pay | Admitting: Emergency Medicine

## 2017-06-23 DIAGNOSIS — J449 Chronic obstructive pulmonary disease, unspecified: Secondary | ICD-10-CM | POA: Diagnosis not present

## 2017-06-23 MED ORDER — PREDNISONE 10 MG PO TABS: ORAL_TABLET | ORAL | 0 refills | Status: DC

## 2017-06-23 NOTE — Assessment & Plan Note (Signed)
She has had recurrent and continuous symptoms.  She is wheezing, dyspneic.  On exam today she has significant upper airway noise as well.  She likely will need an evaluation of the different contributors to upper airway irritation.  In the meantime I think we should treat her with another prednisone taper.  I will ask her to stay on 10 mg daily until she can follow-up with Dr. Chase Caller to decide whether she will need chronic therapy.  If her dyspnea is due to progressive COPD then she may be at the point where she needs chronic steroids.

## 2017-06-23 NOTE — Addendum Note (Signed)
Addended by: Desmond Dike C on: 06/23/2017 04:15 PM   Modules accepted: Orders

## 2017-06-23 NOTE — Telephone Encounter (Signed)
Patient advised that her last prolia injection was given on 01/13/17---according to insurance rules, we have to wait 6 months and one day between each injection--patient advised I will call her back closer to the time she needs to come in and schedule with her

## 2017-06-23 NOTE — Progress Notes (Signed)
Subjective:    Patient ID: Victoria [name], female    DOB: 28-Oct-1945, 72 y.o.   MRN: [id number]  HPI 72 year old woman, former smoker (50 pack years) with a history of obesity, COPD and emphysema followed by Dr. Chase Caller (last seen 04/15/16), hypertension, obstructive sleep apnea.   Presents for an acute visit today. She reports that 5 weeks ago she developed increased dyspnea, wheezing after being exposed to cold air / weather change. Some cough but non-productive. Has been treated with prednisone x 2 since then, most recently started on 05/30/17, also received doxycycline. She improved some, but now reports recurrent dyspnea and wheeze. She is on Stiolto but is using bid instead of qd, uses DuoNeb or ProAir few times a week.   She is on O2 at 2-3L/min.    PFT reviewed by me 04/15/16 showed very severe obstruction without a bronchodilator response, severely decreased diffusion capacity.    Review of Systems  Past Medical History:  Diagnosis Date  . Abdominal aneurysm ([place])    Dr. Oneida Alar follows lLOV 2 ''17 per pt "around 2 cm"  . Anemia    as a child  . Arthritis    left ankle, right knee, right SI joint, wrists, lower back  . COPD (chronic obstructive pulmonary disease) ([place])    ephysema-Dr. Chase Caller  . Dyspnea   . Headache    as a child would have terrible headaches during season changes  . 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 Hyperlipidemia   . Hypertension   . Multiple thyroid nodules   . Murmur, cardiac 1950  . Pneumonia   . Pre-diabetes   . Requires continuous at home supplemental oxygen    2 L 24/7  . Sebaceous cyst    hairline sebaceous cyst left posterior neck to be excised 04-13-16 by Dr. Harlow Asa in [facility]  . Sleep apnea    cpap used sometimes, uses oxygen concentrator 2 l/m nasally bedtime  . Varicella as child     Family History  Problem Relation Age of Onset  . Heart disease Mother        MI - fatal  . Hypertension Mother   . Stroke  Father 21       fatal  . Alzheimer's disease Father   . Alzheimer's disease Brother   . Hyperlipidemia Brother   . Hypertension Brother   . Diabetes Brother   . Hypertension Brother   . Hyperlipidemia Brother      Social History   Socioeconomic History  . Marital status: Divorced    Spouse name: Not on file  . Number of children: 1  . Years of education: 34  . Highest education level: Not on file  Social Needs  . Financial resource strain: Somewhat hard  . Food insecurity - worry: Sometimes true  . Food insecurity - inability: Sometimes true  . Transportation needs - medical: No  . Transportation needs - non-medical: No  Occupational History  . Occupation: Social worker, Technical sales engineer: [employer]  Tobacco Use  . Smoking status: Former Smoker    Packs/day: 1.00    Years: 50.00    Pack years: 50.00    Types: Cigarettes    Last attempt to quit: 04/16/2011    Years since quitting: 6.1  . Smokeless tobacco: Never Used  . Tobacco comment: quit that date when she had to go to ER   Substance and Sexual Activity  . Alcohol use: Yes  Alcohol/week: 0.0 oz    Comment: rare occasion  . Drug use: No    Comment: no marijuana since 2012  . Sexual activity: No  Other Topics Concern  . Not on file  Social History Narrative   HSG; [name], [place]. . Married '69 - 9 yrs/divorced. 1 son - '72; no grandchildren.   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 No h/o physical or sexual abuse. ACP - no living will - wants information. Provided packet of information. On 08/05/2011: she stated she was distant cousins to celebrities [name] and [name]        Allergies  Allergen Reactions  . Nickel Rash    Severe rash to infection: pt is allergic to all metals other than sterling silver or gold jewelry.   [name] [Olmesartan] Swelling    Swelling of face and arms   . [name] [Valsartan] Swelling    Swelling  of face and arms   . Lisinopril Cough  . Codeine Other (See Comments)    jittery  . Hydrocodone-Acetaminophen Nausea And Vomiting  . Monosodium Glutamate Other (See Comments)    Facial swelling per pt  . Lead Acetate Rash     Outpatient Medications Prior to Visit  Medication Sig Dispense Refill  . Albuterol Sulfate (PROAIR RESPICLICK) 254 (90 Base) MCG/ACT AEPB Inhale 1-2 puffs into the lungs every 6 (six) hours as needed. 1 each 5  . amLODipine (NORVASC) 10 MG tablet Take 1 tablet (10 mg total) by mouth daily. 90 tablet 3  . atorvastatin (LIPITOR) 40 MG tablet Take 1 tablet (40 mg total) by mouth daily. Takes daily AM 90 tablet 3  . cholecalciferol (VITAMIN D) 1000 units tablet Take 2,000 Units by mouth daily.    . hydrochlorothiazide (HYDRODIURIL) 25 MG tablet Take 1 tablet (25 mg total) by mouth daily. 90 tablet 3  . ipratropium-albuterol (DUONEB) 0.5-2.5 (3) MG/3ML SOLN USE ONE VIAL IN NEBULIZER 4 TIMES DAILY 360 mL 5  . OXYGEN Inhale 3 L into the lungs. When exerting self     . potassium chloride SA (K-DUR,KLOR-CON) 20 MEQ tablet Take 1 tablet (20 mEq total) by mouth 2 (two) times daily. 180 tablet 1  . sodium chloride (OCEAN) 0.65 % SOLN nasal spray Place 1 spray into both nostrils as needed (dryness).    . Tiotropium Bromide-Olodaterol (STIOLTO RESPIMAT) 2.5-2.5 MCG/ACT AERS Inhale 2 puffs into the lungs daily. 3 Inhaler 0  . tiZANidine (ZANAFLEX) 4 MG tablet Take 1 tablet (4 mg total) by mouth every 8 (eight) hours as needed for muscle spasms. 60 tablet 5   No facility-administered medications prior to visit.         Objective:   Physical Exam Vitals:   06/23/17 1542  BP: (!) 150/62  Pulse: 94  SpO2: 92%  Weight: 214 lb (97.1 kg)  Height: 5' 4.5" (1.638 m)    Gen: Pleasant, over wt woman, in no distress,  normal affect  ENT: No lesions,  mouth clear,  oropharynx clear, no postnasal drip  Neck: No JVD, loud exp stridor  Lungs: No use of accessory muscles, distant  breath sounds, referred upper airway noise in all fields, difficult to discern whether there is true wheezing present due to the upper airway noise  Cardiovascular: RRR, heart sounds normal, no murmur or gallops, trace peripheral edema  Musculoskeletal: No deformities, no cyanosis or clubbing  Neuro: alert, non focal  Skin: Warm, no lesions or rash    Assessment &  Plan:  COPD, severe ([place]) She has had recurrent and continuous symptoms.  She is wheezing, dyspneic.  On exam today she has significant upper airway noise as well.  She likely will need an evaluation of the different contributors to upper airway irritation.  In the meantime I think we should treat her with another prednisone taper.  I will ask her to stay on 10 mg daily until she can follow-up with Dr. Chase Caller to decide whether she will need chronic therapy.  If her dyspnea is due to progressive COPD then she may be at the point where she needs chronic steroids.  Baltazar Apo, MD, PhD 06/23/2017, 4:08 PM [facility] [phone] or if no answer [phone]

## 2017-06-23 NOTE — Patient Instructions (Addendum)
Please go back to using your Stiolto 2 puffs ONCE a day.  Pro Air (albuterol) available to use 2 puffs if needed for chest tightness, shortness of breath, wheezing. Keep DuoNeb available to use if needed Please take prednisone as directed tapering down to 10 mg daily.  Stay on 10 mg daily until you follow-up with Dr. Chase Caller.  At that time you will need to talk about whether he should come off the medication or stay on it chronically. Continue your oxygen at 2 L/min continuous, 3 L/min when on pulsed system. Follow-up with Dr. Chase Caller in 3 -4 weeks

## 2017-06-24 ENCOUNTER — Ambulatory Visit
Admission: RE | Admit: 2017-06-24 | Discharge: 2017-06-24 | Disposition: A | Discharge status: Home / Self Care | Payer: Medicare Other | Source: Ambulatory Visit | Attending: Obstetrics and Gynecology | Admitting: Obstetrics and Gynecology

## 2017-06-24 ENCOUNTER — Other Ambulatory Visit: Payer: Self-pay | Admitting: Obstetrics and Gynecology

## 2017-06-24 DIAGNOSIS — N6489 Other specified disorders of breast: Secondary | ICD-10-CM

## 2017-06-24 DIAGNOSIS — C50412 Malignant neoplasm of upper-outer quadrant of left female breast: Secondary | ICD-10-CM | POA: Diagnosis not present

## 2017-06-24 DIAGNOSIS — N6321 Unspecified lump in the left breast, upper outer quadrant: Secondary | ICD-10-CM | POA: Diagnosis not present

## 2017-06-25 DIAGNOSIS — G4733 Obstructive sleep apnea (adult) (pediatric): Secondary | ICD-10-CM | POA: Diagnosis not present

## 2017-06-28 ENCOUNTER — Telehealth: Payer: Self-pay | Admitting: Oncology

## 2017-06-28 ENCOUNTER — Encounter: Payer: Self-pay | Admitting: *Deleted

## 2017-06-28 NOTE — Telephone Encounter (Signed)
Spoke with patient to confirm morning BC appointment for 07/06/17, packet mailed to patient

## 2017-07-01 ENCOUNTER — Other Ambulatory Visit: Payer: Self-pay | Admitting: *Deleted

## 2017-07-01 DIAGNOSIS — Z170 Estrogen receptor positive status [ER+]: Secondary | ICD-10-CM | POA: Insufficient documentation

## 2017-07-01 DIAGNOSIS — C50412 Malignant neoplasm of upper-outer quadrant of left female breast: Secondary | ICD-10-CM

## 2017-07-05 NOTE — Progress Notes (Signed)
[facility]  [phone]) [phone] [phone]     ID: Victoria [name] DOB: Jun 21, 1945  MR#: [id number]  IWL#:[id number]  Patient Care Team: Victoria Lima, MD as PCP - General (Internal Medicine) Victoria Males, MD as Consulting Physician (Pulmonary Disease) Victoria Nigh, MD as Consulting Physician (Obstetrics and Gynecology) Victoria Shin, MD as Consulting Physician (Endocrinology) Magrinat, Virgie Dad, MD as Consulting Physician (Oncology) Victoria Bookbinder, MD as Consulting Physician (General Surgery) Victoria Pray, MD as Consulting Physician (Radiation Oncology) OTHER MD:  CHIEF COMPLAINT: Estrogen receptor positive breast cancer  CURRENT TREATMENT: Awaiting definitive surgery   HISTORY OF CURRENT ILLNESS: Victoria [name] had routine screening mammography on 06/16/2017 showing a possible abnormality in the left breast. She underwent unilateral left diagnostic mammography with tomography and left breast ultrasonography at [facility] on 06/22/2017 showing: breast density category B. Suspicious left breast mass in the 2:30 position upper outer quadrant measuring 0.6 x 0.7 x 0.6 cm located 2 cm from the nipple. Palpable thickening with distortion. There was no sonographic evidence of lest axillary lymphadenopathy.  Accordingly on 06/24/2017 she proceeded to biopsy of the left breast mass in question. The pathology from this procedure showed ([id number]):  Invasive mammary carcinoma, grade 1. E-cadherin is positive supporting Ductal carcinoma diagnosis. Prognostic indicators significant for: estrogen receptor, 100% positive and progesterone receptor, 100% positive, both with strong staining intensity. Proliferation marker Ki67 at 5%. HER2 not amplified with ratios of HER2/CEP17 signals 1.21 and average copies per cell 2.00.  The patient's subsequent history is as detailed below.  INTERVAL HISTORY: Victoria [name] was evaluated in the multidisciplinary breast cancer  clinic on 07/06/2017 accompanied by her friend, Victoria [name] and her daughter-in-law, Victoria [name]. Her case was also presented at the multidisciplinary breast cancer conference on the same day. At that time a preliminary plan was proposed: Lumpectomy, without sentinel lymph node sampling.  No Oncotype, likely no radiation.  Follow-up with antiestrogens.   REVIEW OF SYSTEMS: There were no specific symptoms leading to the original mammogram, which was routinely scheduled. She notes that she had a Prolia shot and a bone density scan. She notes that her next one scheduled in March. The patient denies unusual headaches, visual changes, nausea, vomiting, stiff neck, dizziness, or gait imbalance.  She has chronic pulmonary symptoms but there has been no increase in cough, phlegm production, or pleurisy, no chest pain or pressure, and no change in bowel or bladder habits. The patient denies fever, rash, bleeding, unexplained fatigue or unexplained weight loss. A detailed review of systems was otherwise entirely negative.   PAST MEDICAL HISTORY: Past Medical History:  Diagnosis Date  . Abdominal aneurysm ([place])    Dr. Oneida [name] follows lLOV 2 ''17 per pt "around 2 cm"  . Anemia    as a child  . Arthritis    left ankle, right knee, right SI joint, wrists, lower back  . COPD (chronic obstructive pulmonary disease) ([place])    ephysema-Dr. Chase [name]  . Dyspnea   . Headache    as a child would have terrible headaches during season changes  . 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 Hyperlipidemia   . Hypertension   . Multiple thyroid nodules   . Murmur, cardiac 1950  . Osteoporosis   . Pneumonia   . Pre-diabetes   . Requires continuous at home supplemental oxygen    2 L 24/7  . Sebaceous cyst    hairline sebaceous cyst left posterior neck to be excised 04-13-16  by Dr. Harlow [name] in [place] [facility]  . Sleep apnea    cpap used sometimes, uses oxygen concentrator 2 l/m nasally bedtime  . Varicella as child     PAST SURGICAL HISTORY: Past Surgical History:  Procedure Laterality Date  . APPENDECTOMY     2008  . [place]  . COLONOSCOPY    . COLONOSCOPY WITH PROPOFOL N/A 04/16/2016   Procedure: COLONOSCOPY WITH PROPOFOL;  Surgeon: Victoria Ada, MD;  Location: [facility];  Service: Endoscopy;  Laterality: N/A;  . CYST REMOVAL NECK Left 04/13/2016   Procedure: EXCISION OF SEBACEOUS CYST LEFT POSTERIOR NECK;  Surgeon: Victoria Gemma, MD;  Location: [facility];  Service: General;  Laterality: Left;  . Excision of Pelvic Absess, Right Ovary     2008  . TUBAL LIGATION  1980    FAMILY HISTORY Family History  Problem Relation Age of Onset  . Heart disease Mother        MI - fatal  . Hypertension Mother   . Stroke Father 24       fatal  . Alzheimer's disease Father   . Alzheimer's disease Brother   . Hyperlipidemia Brother   . Hypertension Brother   . Diabetes Brother   . Hypertension Brother   . Hyperlipidemia Brother   . Breast cancer Paternal Grandmother   The patient's father died at age 71 due to a stroke and prostate cancer.  The patient's mother died at age 72 due to a heart attack and blood clot. The patient has 3 brothers and no sisters. She notes a paternal grandmother who was diagnosed with breast cancer at age 66. She notes a paternal cousin with lung cancer. She denies a family history of ovarian cancer.  GYNECOLOGIC HISTORY:  No LMP recorded. Patient is postmenopausal. Menarche: 72 years old Age at first live birth: 72 years old Victoria [name] P1 LMP: age 38 The patient had a right ovarian cyst which required removal. She notes that she still has her left ovary. She was on HRT for a short amount of time, but discontinued due to negative side affects. She was placed on Estroven as well.    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 She is divorced. The patient's son, Victoria [name], is disabled and works part-time at [employer] in [place]. The patient has 3 grandchildren and 3 great-grandchildren. She attends [employer].      ADVANCED DIRECTIVES:  Son, Victoria [name] and daughter in law, Victoria [name]   HEALTH MAINTENANCE: Social History   Tobacco Use  . Smoking status: Former Smoker    Packs/day: 1.00    Years: 50.00    Pack years: 50.00    Types: Cigarettes    Last attempt to quit: 04/16/2011    Years since quitting: 6.2  . Smokeless tobacco: Never Used  . Tobacco comment: quit that date when she had to go to ER   Substance Use Topics  . Alcohol use: Yes    Alcohol/week: 0.0 oz    Comment: rare occasion  . Drug use: No    Comment: no marijuana since 2012     Colonoscopy: December 2017/ Dr. Benson Norway  PAP: September 2016  Bone density: 12/08/2016 showed a T score of -2.4   Allergies  Allergen Reactions  . Nickel Rash    Severe  rash to infection: pt is allergic to all metals other than sterling silver or gold jewelry.   [name] [Olmesartan] Swelling    Swelling of face and arms   . [name] [Valsartan] Swelling    Swelling of face and arms   . Lisinopril Cough  . Codeine Other (See Comments)    jittery  . Hydrocodone-Acetaminophen Nausea And Vomiting  . Monosodium Glutamate Other (See Comments)    Facial swelling per pt  . Lead Acetate Rash    Current Outpatient Medications  Medication Sig Dispense Refill  . Albuterol Sulfate (PROAIR RESPICLICK) 413 (90 Base) MCG/ACT AEPB Inhale 1-2 puffs into the lungs every 6 (six) hours as needed. 1 each 5  . amLODipine (NORVASC) 10 MG tablet Take 1 tablet (10 mg total) by mouth daily. 90 tablet 3  . atorvastatin (LIPITOR) 40 MG tablet Take 1 tablet (40 mg total) by mouth daily. Takes daily AM 90 tablet 3  . cholecalciferol (VITAMIN D) 1000 units tablet Take 2,000 Units by mouth daily.    .  hydrochlorothiazide (HYDRODIURIL) 25 MG tablet Take 1 tablet (25 mg total) by mouth daily. 90 tablet 3  . ipratropium-albuterol (DUONEB) 0.5-2.5 (3) MG/3ML SOLN USE ONE VIAL IN NEBULIZER 4 TIMES DAILY 360 mL 5  . OXYGEN Inhale 3 L into the lungs. When exerting self     . potassium chloride SA (K-DUR,KLOR-CON) 20 MEQ tablet Take 1 tablet (20 mEq total) by mouth 2 (two) times daily. 180 tablet 1  . predniSONE (DELTASONE) 10 MG tablet Take 4 tablets for 3 days, 3 tablets for 3 days, 2 tablets for 3 days, 1 tablet daily until you see Dr. Chase [name] 60 tablet 0  . sodium chloride (OCEAN) 0.65 % SOLN nasal spray Place 1 spray into both nostrils as needed (dryness).    . Tiotropium Bromide-Olodaterol (STIOLTO RESPIMAT) 2.5-2.5 MCG/ACT AERS Inhale 2 puffs into the lungs daily. 3 Inhaler 0  . tiZANidine (ZANAFLEX) 4 MG tablet Take 1 tablet (4 mg total) by mouth every 8 (eight) hours as needed for muscle spasms. 60 tablet 5   No current facility-administered medications for this visit.     OBJECTIVE: Older African-American woman using oxygen by nasal cannula  Vitals:   07/06/17 0848  BP: (!) 162/75  Pulse: 86  Resp: 17  Temp: 97.9 F (36.6 C)  SpO2: 97%     Body mass index is 36.5 kg/m.   Wt Readings from Last 3 Encounters:  07/06/17 216 lb (98 kg)  06/23/17 214 lb (97.1 kg)  06/07/17 214 lb (97.1 kg)      ECOG FS:2 - Symptomatic, <50% confined to bed  Ocular: Sclerae unicteric, pupils round and equal Lymphatic: No cervical or supraclavicular adenopathy Lungs no rales or rhonchi Heart regular rate and rhythm Abd soft, obese, nontender, positive bowel sounds MSK no focal spinal tenderness Neuro: non-focal, well-oriented, engaging affect Breasts: The right breast is unremarkable.  The left breast is status post recent biopsy.  I do not palpate a mass.  Both axillae are benign.   LAB RESULTS:  CMP     Component Value Date/Time   NA 143 07/06/2017 0808   K 3.3 (L) 07/06/2017 0808    CL 102 07/06/2017 0808   CO2 31 (H) 07/06/2017 0808   GLUCOSE 157 (H) 07/06/2017 0808   BUN 19 07/06/2017 0808   CREATININE 0.83 07/06/2017 0808   CALCIUM 10.1 07/06/2017 0808   PROT 6.6 07/06/2017 0808   ALBUMIN 3.4 (L) 07/06/2017 2440  AST 9 07/06/2017 0808   ALT 7 07/06/2017 0808   ALKPHOS 130 07/06/2017 0808   BILITOT 0.3 07/06/2017 0808   GFRNONAA >60 07/06/2017 0808   GFRAA >60 07/06/2017 0808    Lab Results  Component Value Date   TOTALPROTELP 6.7 02/28/2014   ALBUMINELP 57.4 02/28/2014   A1GS 4.9 02/28/2014   A2GS 8.5 02/28/2014   BETS 7.5 (H) 02/28/2014   BETA2SER 5.3 02/28/2014   GAMS 16.4 02/28/2014   MSPIKE 0.31 02/28/2014   SPEI SEE NOTE 02/28/2014    No results found for: KPAFRELGTCHN, LAMBDASER, KAPLAMBRATIO  Lab Results  Component Value Date   WBC 16.1 (H) 07/06/2017   NEUTROABS 12.4 (H) 07/06/2017   HGB 12.9 01/13/2017   HCT 40.6 07/06/2017   MCV 88.8 07/06/2017   PLT 233 07/06/2017    '@LASTCHEMISTRY' @  No results found for: LABCA2  No components found for: KAJGOT157  No results for input(s): INR in the last 168 hours.  No results found for: LABCA2  No results found for: WIO035  No results found for: DHR416  No results found for: LAG536  No results found for: CA2729  No components found for: HGQUANT  No results found for: CEA1 / No results found for: CEA1   No results found for: AFPTUMOR  No results found for: CHROMOGRNA  No results found for: PSA1  Appointment on 07/06/2017  Component Date Value Ref Range Status  . Sodium 07/06/2017 143  136 - 145 mmol/L Final  . Potassium 07/06/2017 3.3* 3.5 - 5.1 mmol/L Final  . Chloride 07/06/2017 102  98 - 109 mmol/L Final  . CO2 07/06/2017 31* 22 - 29 mmol/L Final  . Glucose, Bld 07/06/2017 157* 70 - 140 mg/dL Final  . BUN 07/06/2017 19  7 - 26 mg/dL Final  . Creatinine 07/06/2017 0.83  0.60 - 1.10 mg/dL Final  . Calcium 07/06/2017 10.1  8.4 - 10.4 mg/dL Final  . Total Protein  07/06/2017 6.6  6.4 - 8.3 g/dL Final  . Albumin 07/06/2017 3.4* 3.5 - 5.0 g/dL Final  . AST 07/06/2017 9  5 - 34 U/L Final  . ALT 07/06/2017 7  0 - 55 U/L Final  . Alkaline Phosphatase 07/06/2017 130  40 - 150 U/L Final  . Total Bilirubin 07/06/2017 0.3  0.2 - 1.2 mg/dL Final  . GFR, Est Non Af Am 07/06/2017 >60  >60 mL/min Final  . GFR, Est AFR Am 07/06/2017 >60  >60 mL/min Final   Comment: (NOTE) The eGFR has been calculated using the CKD EPI equation. This calculation has not been validated in all clinical situations. eGFR's persistently <60 mL/min signify possible Chronic Kidney Disease.   Georgiann Hahn gap 07/06/2017 10  3 - 11 Final   Performed at [facility], [place] [address], [place], [place] [address]  . WBC Count 07/06/2017 16.1* 3.9 - 10.3 K/uL Final  . RBC 07/06/2017 4.57  3.70 - 5.45 MIL/uL Final  . Hemoglobin 07/06/2017 13.1  11.6 - 15.9 g/dL Final  . HCT 07/06/2017 40.6  34.8 - 46.6 % Final  . MCV 07/06/2017 88.8  79.5 - 101.0 fL Final  . MCH 07/06/2017 28.7  25.1 - 34.0 pg Final  . MCHC 07/06/2017 32.3  31.5 - 36.0 g/dL Final  . RDW 07/06/2017 14.4  11.2 - 14.5 % Final  . Platelet Count 07/06/2017 233  145 - 400 K/uL Final  . Neutrophils Relative % 07/06/2017 77  % Final  . Neutro Abs 07/06/2017  12.4* 1.5 - 6.5 K/uL Final  . Lymphocytes Relative 07/06/2017 15  % Final  . Lymphs Abs 07/06/2017 2.4  0.9 - 3.3 K/uL Final  . Monocytes Relative 07/06/2017 7  % Final  . Monocytes Absolute 07/06/2017 1.2* 0.1 - 0.9 K/uL Final  . Eosinophils Relative 07/06/2017 1  % Final  . Eosinophils Absolute 07/06/2017 0.1  0.0 - 0.5 K/uL Final  . Basophils Relative 07/06/2017 0  % Final  . Basophils Absolute 07/06/2017 0.0  0.0 - 0.1 K/uL Final   Performed at [facility], [place] [address] [place], [place] [address]    (this displays the last labs from the last 3 days)  Lab Results  Component Value Date   TOTALPROTELP 6.7 02/28/2014    ALBUMINELP 57.4 02/28/2014   A1GS 4.9 02/28/2014   A2GS 8.5 02/28/2014   BETS 7.5 (H) 02/28/2014   BETA2SER 5.3 02/28/2014   GAMS 16.4 02/28/2014   MSPIKE 0.31 02/28/2014   SPEI SEE NOTE 02/28/2014   (this displays SPEP labs)  No results found for: KPAFRELGTCHN, LAMBDASER, KAPLAMBRATIO (kappa/lambda light chains)  No results found for: HGBA, HGBA2QUANT, HGBFQUANT, HGBSQUAN (Hemoglobinopathy evaluation)   No results found for: LDH  No results found for: IRON, TIBC, IRONPCTSAT (Iron and TIBC)  No results found for: FERRITIN  Urinalysis    Component Value Date/Time   COLORURINE YELLOW 01/18/2017 [place] 01/18/2017 1605   LABSPEC 1.015 01/18/2017 1605   PHURINE 7.0 01/18/2017 1605   GLUCOSEU NEGATIVE 01/18/2017 1605   HGBUR NEGATIVE 01/18/2017 1605   BILIRUBINUR NEGATIVE 01/18/2017 1605   KETONESUR NEGATIVE 01/18/2017 1605   PROTEINUR 30 (A) 04/18/2011 1942   UROBILINOGEN 0.2 01/18/2017 1605   NITRITE NEGATIVE 01/18/2017 1605   LEUKOCYTESUR NEGATIVE 01/18/2017 1605     STUDIES: US Breast Ltd Uni Left Inc Axilla  Result Date: 06/23/2017 CLINICAL DATA:  Patient was called back from screening mammogram for possible distortion in the left breast. EXAM: 2D DIGITAL DIAGNOSTIC LEFT MAMMOGRAM WITH CAD AND ADJUNCT TOMO ULTRASOUND LEFT BREAST COMPARISON:  Previous exam(s). ACR Breast Density Category b: There are scattered areas of fibroglandular density. FINDINGS: Additional imaging of the left breast was performed. There is a 9 mm mass with distortion in the anterior third of the upper-outer quadrant. There are no malignant type microcalcifications. On physical exam, I palpate discrete thickening in the left breast at 2:30 2 cm from the nipple. Targeted ultrasound is performed, showing an irregular hypoechoic mass with shadowing in the left breast at 2:30 2 cm from the nipple measuring 6 x 7 x 6 mm. Sonographic evaluation the left axilla does not show any enlarged  adenopathy. IMPRESSION: Suspicious left breast mass. RECOMMENDATION: Ultrasound-guided core biopsy of the left breast mass is recommended. The ultrasound-guided core biopsy will be scheduled at the patient's convenience. I have discussed the findings and recommendations with the patient. Results were also provided in writing at the conclusion of the visit. If applicable, a reminder letter will be sent to the patient regarding the next appointment. BI-RADS CATEGORY  4: Suspicious. Electronically Signed   By: Lillia Mountain M.D.   On: 06/22/2017 11:56   Mm Diag Breast Tomo Uni Left  Result Date: 06/22/2017 CLINICAL DATA:  Patient was called back from screening mammogram for possible distortion in the left breast. EXAM: 2D DIGITAL DIAGNOSTIC LEFT MAMMOGRAM WITH CAD AND ADJUNCT TOMO ULTRASOUND LEFT BREAST COMPARISON:  Previous exam(s). ACR Breast Density Category b: There are scattered areas of fibroglandular  density. FINDINGS: Additional imaging of the left breast was performed. There is a 9 mm mass with distortion in the anterior third of the upper-outer quadrant. There are no malignant type microcalcifications. On physical exam, I palpate discrete thickening in the left breast at 2:30 2 cm from the nipple. Targeted ultrasound is performed, showing an irregular hypoechoic mass with shadowing in the left breast at 2:30 2 cm from the nipple measuring 6 x 7 x 6 mm. Sonographic evaluation the left axilla does not show any enlarged adenopathy. IMPRESSION: Suspicious left breast mass. RECOMMENDATION: Ultrasound-guided core biopsy of the left breast mass is recommended. The ultrasound-guided core biopsy will be scheduled at the patient's convenience. I have discussed the findings and recommendations with the patient. Results were also provided in writing at the conclusion of the visit. If applicable, a reminder letter will be sent to the patient regarding the next appointment. BI-RADS CATEGORY  4: Suspicious. Electronically  Signed   By: Lillia Mountain M.D.   On: 06/22/2017 11:56   Mm Clip Placement Left  Result Date: 06/24/2017 CLINICAL DATA:  Post ultrasound-guided core needle biopsy of left breast. EXAM: DIAGNOSTIC LEFT MAMMOGRAM POST ULTRASOUND BIOPSY COMPARISON:  Previous exam(s). FINDINGS: Mammographic images were obtained following ultrasound guided biopsy of left breast. Two-view mammography demonstrates ribbon shaped marker within the nodule of interest in the left breast 2:30 o'clock, anterior depth. IMPRESSION: Successful placement of ribbon shaped marker within the left breast, post ultrasound-guided core needle biopsy. Final Assessment: Post Procedure Mammograms for Marker Placement Electronically Signed   By: Fidela Salisbury M.D.   On: 06/24/2017 08:52   [place] Lt Breast Bx W Loc Dev 1st Lesion Img Bx Spec US Guide  Addendum Date: 06/29/2017   ADDENDUM REPORT: 06/27/2017 13:05 ADDENDUM: Pathology revealed GRADE I INVASIVE MAMMARY CARCINOMA WITH PERINEURAL INVASION, MAMMARY CARCINOMA IN-SITU, PAPILLARY LESION, CALCIFICATIONS of the Left breast, 2:30 o'clock. This was found to be concordant by Dr. Fidela Salisbury. Pathology results were discussed with the patient by telephone. The patient reported doing well after the biopsy with tenderness and bruising at the site. Post biopsy instructions and care were reviewed and questions were answered. The patient was encouraged to call [facility] for any additional concerns. The patient was referred to [facility] on July 06, 2017. Pathology results reported by Terie Purser, RN on 06/27/2017. Electronically Signed   By: Fidela Salisbury M.D.   On: 06/27/2017 13:05   Result Date: 06/29/2017 CLINICAL DATA:  Left breast 2:30 o'clock nodule. EXAM: ULTRASOUND GUIDED LEFT BREAST CORE NEEDLE BIOPSY COMPARISON:  Previous exam(s). FINDINGS: I met with the patient and we  discussed the procedure of ultrasound-guided biopsy, including benefits and alternatives. We discussed the high likelihood of a successful procedure. We discussed the risks of the procedure, including infection, bleeding, tissue injury, clip migration, and inadequate sampling. Informed written consent was given. The usual time-out protocol was performed immediately prior to the procedure. Lesion quadrant: Upper outer quadrant Using sterile technique and 1% Lidocaine as local anesthetic, under direct ultrasound visualization, a 14 gauge spring-loaded device was used to perform biopsy of left breast 2:30 o'clock nodule using a inferior approach. At the conclusion of the procedure a ribbon shaped tissue marker clip was deployed into the biopsy cavity. Follow up 2 view mammogram was performed and dictated separately. IMPRESSION: Ultrasound guided biopsy of left breast.  No apparent complications. Electronically Signed: By: Fidela Salisbury M.D. On:  06/24/2017 08:26    ELIGIBLE FOR AVAILABLE RESEARCH PROTOCOL: no  ASSESSMENT: 72 y.o. [place] woman status post left breast upper outer quadrant biopsy 06/24/2017 for a clinical T1b N0, stage IA invasive ductal carcinoma, grade 1, estrogen and progesterone receptor positive, HER-2 not amplified, with an MIB-105%  (1) breast conserving surgery without sentinel lymph node sampling planned  (2) given the patient's age, the tumor size, grade and prognostic profile, will forego Oncotype and avoid chemotherapy  (3) may forgo adjuvant radiation if comfortable with anti-estrogen therapy  (4) antiestrogens to follow at the completion of local treatment    PLAN: We spent the better part of today's hour-long appointment discussing the biology of her diagnosis and the specifics of her situation. We first reviewed the fact that cancer is not one disease but more than 100 different diseases and that it is important to keep them separate-- otherwise when friends and  relatives discuss their own cancer experiences with Reesha confusion can result. Similarly we explained that if breast cancer spreads to the bone or liver, the patient would not have bone cancer or liver cancer, but breast cancer in the bone and breast cancer in the liver: one cancer in three places-- not 3 different cancers which otherwise would have to be treated in 3 different ways.  We discussed the difference between local and systemic therapy. In terms of loco-regional treatment, lumpectomy plus radiation is equivalent to mastectomy as far as survival is concerned. For this reason, and because the cosmetic results are generally superior, we recommend breast conserving surgery.  In addition, for women over 70 with small low-grade node-negative tumors, there is no survival advantage to adjuvant radiation except adjuvant hormone therapy.  Accordingly adjuvant radiation is not anticipated in this case  We then discussed the rationale for systemic therapy. There is some risk that this cancer may have already spread to other parts of her body. Patients frequently ask at this point about bone scans, CAT scans and PET scans to find out if they have occult breast cancer somewhere else. The problem is that in early stage disease we are much more likely to find false positives then true cancers and this would expose the patient to unnecessary procedures as well as unnecessary radiation. Scans cannot answer the question the patient really would like to know, which is whether she has microscopic disease elsewhere in her body. For those reasons we do not recommend them.  Of course we would proceed to aggressive evaluation of any symptoms that might suggest metastatic disease, but that is not the case here.  Next we went over the options for systemic therapy which are anti-estrogens, anti-HER-2 immunotherapy, and chemotherapy. Kalicia does not meet criteria for anti-HER-2 immunotherapy. She is a good candidate for  anti-estrogens.  The question of chemotherapy is more complicated. Chemotherapy is most effective in rapidly growing, aggressive tumors. It is much less effective in low-grade, slow growing cancers, like Tammie 's. For that reason and taking into account the patient's age, comorbidities, and the size and prognostic profile of her tumor, it is our experience tumors like this are invariably in the low risk group and Oncotype so we will forego Oncotype testing and forego the chemotherapy option.  The plan then is for lumpectomy followed by antiestrogens.  Lucianna has a good understanding of the overall plan. She agrees with it. She knows the goal of treatment in her case is cure. She will call with any problems that may develop before her next visit here.  Magrinat, Virgie Dad, MD  07/06/17 3:07 PM Medical Oncology and Hematology [facility] [address] [place], [place] [address] Tel. [phone]    Fax. [phone]  This document serves as a record of services personally performed by Lurline Del, MD. It was created on his behalf by Victoria [name], a trained medical scribe. The creation of this record is based on the scribe's personal observations and the provider's statements to them.   I have reviewed the above documentation for accuracy and completeness, and I agree with the above.

## 2017-07-06 ENCOUNTER — Encounter: Payer: Self-pay | Admitting: General Practice

## 2017-07-06 ENCOUNTER — Encounter: Payer: Self-pay | Admitting: Oncology

## 2017-07-06 ENCOUNTER — Inpatient Hospital Stay: Payer: Medicare Other

## 2017-07-06 ENCOUNTER — Ambulatory Visit
Admission: RE | Admit: 2017-07-06 | Discharge: 2017-07-06 | Disposition: A | Discharge status: Home / Self Care | Payer: Medicare Other | Source: Ambulatory Visit | Attending: Radiation Oncology | Admitting: Radiation Oncology

## 2017-07-06 ENCOUNTER — Ambulatory Visit: Payer: Medicare Other | Admitting: Physical Therapy

## 2017-07-06 ENCOUNTER — Inpatient Hospital Stay: Payer: Medicare Other | Attending: Oncology | Admitting: Oncology

## 2017-07-06 ENCOUNTER — Telehealth: Payer: Self-pay | Admitting: Oncology

## 2017-07-06 VITALS — BP 162/75 | HR 86 | Temp 97.9°F | Resp 17 | Ht 64.5 in | Wt 216.0 lb

## 2017-07-06 DIAGNOSIS — I714 Abdominal aortic aneurysm, without rupture, unspecified: Secondary | ICD-10-CM

## 2017-07-06 DIAGNOSIS — Z87891 Personal history of nicotine dependence: Secondary | ICD-10-CM | POA: Diagnosis not present

## 2017-07-06 DIAGNOSIS — C50419 Malignant neoplasm of upper-outer quadrant of unspecified female breast: Secondary | ICD-10-CM | POA: Diagnosis not present

## 2017-07-06 DIAGNOSIS — Z9981 Dependence on supplemental oxygen: Secondary | ICD-10-CM | POA: Diagnosis not present

## 2017-07-06 DIAGNOSIS — C50412 Malignant neoplasm of upper-outer quadrant of left female breast: Secondary | ICD-10-CM | POA: Diagnosis not present

## 2017-07-06 DIAGNOSIS — J449 Chronic obstructive pulmonary disease, unspecified: Secondary | ICD-10-CM

## 2017-07-06 DIAGNOSIS — Z170 Estrogen receptor positive status [ER+]: Secondary | ICD-10-CM | POA: Diagnosis not present

## 2017-07-06 DIAGNOSIS — Z803 Family history of malignant neoplasm of breast: Secondary | ICD-10-CM | POA: Diagnosis not present

## 2017-07-06 DIAGNOSIS — J9611 Chronic respiratory failure with hypoxia: Secondary | ICD-10-CM

## 2017-07-06 DIAGNOSIS — I10 Essential (primary) hypertension: Secondary | ICD-10-CM | POA: Insufficient documentation

## 2017-07-06 DIAGNOSIS — M810 Age-related osteoporosis without current pathological fracture: Secondary | ICD-10-CM | POA: Diagnosis not present

## 2017-07-06 DIAGNOSIS — E118 Type 2 diabetes mellitus with unspecified complications: Secondary | ICD-10-CM

## 2017-07-06 LAB — CBC WITH DIFFERENTIAL (CANCER CENTER ONLY)
BASOS ABS: 0 10*3/uL (ref 0.0–0.1)
BASOS PCT: 0 %
EOS ABS: 0.1 10*3/uL (ref 0.0–0.5)
Eosinophils Relative: 1 %
HEMATOCRIT: 40.6 % (ref 34.8–46.6)
Hemoglobin: 13.1 g/dL (ref 11.6–15.9)
Lymphocytes Relative: 15 %
Lymphs Abs: 2.4 10*3/uL (ref 0.9–3.3)
MCH: 28.7 pg (ref 25.1–34.0)
MCHC: 32.3 g/dL (ref 31.5–36.0)
MCV: 88.8 fL (ref 79.5–101.0)
Monocytes Absolute: 1.2 10*3/uL — ABNORMAL HIGH (ref 0.1–0.9)
Monocytes Relative: 7 %
NEUTROS ABS: 12.4 10*3/uL — AB (ref 1.5–6.5)
Neutrophils Relative %: 77 %
Platelet Count: 233 10*3/uL (ref 145–400)
RBC: 4.57 MIL/uL (ref 3.70–5.45)
RDW: 14.4 % (ref 11.2–14.5)
WBC: 16.1 10*3/uL — AB (ref 3.9–10.3)

## 2017-07-06 LAB — CMP (CANCER CENTER ONLY)
ALK PHOS: 130 U/L (ref 40–150)
ALT: 7 U/L (ref 0–55)
ANION GAP: 10 (ref 3–11)
AST: 9 U/L (ref 5–34)
Albumin: 3.4 g/dL — ABNORMAL LOW (ref 3.5–5.0)
BUN: 19 mg/dL (ref 7–26)
CALCIUM: 10.1 mg/dL (ref 8.4–10.4)
CO2: 31 mmol/L — AB (ref 22–29)
CREATININE: 0.83 mg/dL (ref 0.60–1.10)
Chloride: 102 mmol/L (ref 98–109)
GFR, Est AFR Am: >60 mL/min (ref >60)
Glucose, Bld: 157 mg/dL — ABNORMAL HIGH (ref 70–140)
Potassium: 3.3 mmol/L — ABNORMAL LOW (ref 3.5–5.1)
Sodium: 143 mmol/L (ref 136–145)
TOTAL PROTEIN: 6.6 g/dL (ref 6.4–8.3)
Total Bilirubin: 0.3 mg/dL (ref 0.2–1.2)

## 2017-07-06 NOTE — Progress Notes (Signed)
Radiation Oncology         [phone] ________________________________  Initial Outpatient Consultation  Name: Victoria [name] MRN: [id number]  Date: 07/06/2017  DOB: [birth year]  AG:TXMIW, Arvid Right, MD  Rolm Bookbinder, MD   REFERRING PHYSICIAN: Rolm Bookbinder, MD  DIAGNOSIS: 72 year-old woman with clinical Stage I (T1b, N0) invasive mammary carcinoma, ER+ / PR+ / HER-2(-).   The encounter diagnosis was Malignant neoplasm of upper-outer quadrant of left breast in female, estrogen receptor positive ([place]).  HISTORY OF PRESENT ILLNESS::Victoria [name] is a 72 y.o. female who is seen today in our multidisciplinary breast clinic. She originally presented for screening mammogram on 06/16/2017 which showed possible distortion in the left breast. Accordingly a diagnostic mammogram and ultrasound were performed. This imaging showed a 9 mm mass with distortion in the anterior third of the upper-outer quadrant as well as an irregular hypoechoic mass with shadowing in the left breast at 2:30 2 cm from the nipple measuring 6 x 7 x 6 mm. Biopsy of the left breast 2:30 o'clock position revealed invasive mammary carcinoma with perineural invasion and mammary carcinoma in-situ with papillary lesion and calcifications. The biopsy material shows an infiltrative proliferation of cells arranged linearly and in small clusters. Based on the biopsy, the carcinoma appears Nottingham grade 1 of 3 and measures 1.0 cm in greatest linear extent. ER 100% / PR 100% / HER-2 negative / Ki-67 5 %. E-cadherin is positive supporting a ductal origin.   The patient is here for further evaluation and discussion of treatment options in the management of her disease.  PREVIOUS RADIATION THERAPY: No  PAST MEDICAL HISTORY:  has a past medical history of Abdominal aneurysm ([place]), Anemia, Arthritis, COPD (chronic obstructive pulmonary disease) ([place]), Dyspnea, Headache, Heart murmur, Hyperlipidemia, Hypertension, Multiple thyroid  nodules, Murmur, cardiac (1950), Osteoporosis, Pneumonia, Pre-diabetes, Requires continuous at home supplemental oxygen, Sebaceous cyst, Sleep apnea, and Varicella (as child).    PAST SURGICAL HISTORY: Past Surgical History:  Procedure Laterality Date  . APPENDECTOMY     2008  . [place]  . COLONOSCOPY    . COLONOSCOPY WITH PROPOFOL N/A 04/16/2016   Procedure: COLONOSCOPY WITH PROPOFOL;  Surgeon: Carol Ada, MD;  Location: [facility];  Service: Endoscopy;  Laterality: N/A;  . CYST REMOVAL NECK Left 04/13/2016   Procedure: EXCISION OF SEBACEOUS CYST LEFT POSTERIOR NECK;  Surgeon: Armandina Gemma, MD;  Location: [place];  Service: General;  Laterality: Left;  . Excision of Pelvic Absess, Right Ovary     2008  . TUBAL LIGATION  1980    FAMILY HISTORY: family history includes Alzheimer's disease in her brother and father; Breast cancer in her paternal grandmother; Diabetes in her brother; Heart disease in her mother; Hyperlipidemia in her brother and brother; Hypertension in her brother, brother, and mother; Stroke (age of onset: 67) in her father.  SOCIAL HISTORY:  reports that she quit smoking about 6 years ago. Her smoking use included cigarettes. She has a 50.00 pack-year smoking history. she has never used smokeless tobacco. She reports that she drinks alcohol. She reports that she does not use drugs.  ALLERGIES: Nickel; Benicar [olmesartan]; [name] [valsartan]; Lisinopril; Codeine; Hydrocodone-acetaminophen; Monosodium glutamate; and Lead acetate  MEDICATIONS:  Current Outpatient Medications  Medication Sig Dispense Refill  . Albuterol Sulfate (PROAIR RESPICLICK) 803 (90 Base) MCG/ACT AEPB Inhale 1-2 puffs into the lungs every 6 (six) hours as needed. 1 each 5  . amLODipine (NORVASC) 10 MG tablet Take 1  tablet (10 mg total) by mouth daily. 90 tablet 3  . atorvastatin (LIPITOR) 40 MG tablet Take 1 tablet (40 mg total) by mouth daily. Takes daily AM 90 tablet 3  .  cholecalciferol (VITAMIN D) 1000 units tablet Take 2,000 Units by mouth daily.    . hydrochlorothiazide (HYDRODIURIL) 25 MG tablet Take 1 tablet (25 mg total) by mouth daily. 90 tablet 3  . ipratropium-albuterol (DUONEB) 0.5-2.5 (3) MG/3ML SOLN USE ONE VIAL IN NEBULIZER 4 TIMES DAILY 360 mL 5  . OXYGEN Inhale 3 L into the lungs. When exerting self     . potassium chloride SA (K-DUR,KLOR-CON) 20 MEQ tablet Take 1 tablet (20 mEq total) by mouth 2 (two) times daily. 180 tablet 1  . predniSONE (DELTASONE) 10 MG tablet Take 4 tablets for 3 days, 3 tablets for 3 days, 2 tablets for 3 days, 1 tablet daily until you see Dr. Chase Caller 60 tablet 0  . sodium chloride (OCEAN) 0.65 % SOLN nasal spray Place 1 spray into both nostrils as needed (dryness).    . Tiotropium Bromide-Olodaterol (STIOLTO RESPIMAT) 2.5-2.5 MCG/ACT AERS Inhale 2 puffs into the lungs daily. 3 Inhaler 0  . tiZANidine (ZANAFLEX) 4 MG tablet Take 1 tablet (4 mg total) by mouth every 8 (eight) hours as needed for muscle spasms. 60 tablet 5   No current facility-administered medications for this encounter.     REVIEW OF SYSTEMS:  REVIEW OF SYSTEMS: A 10+ POINT REVIEW OF SYSTEMS WAS OBTAINED including neurology, dermatology, psychiatry, cardiac, respiratory, lymph, extremities, GI, GU, musculoskeletal, constitutional, reproductive, HEENT. All pertinent positives are noted in the HPI. All others are negative.   PHYSICAL EXAM:  Vitals with BMI 07/06/2017  Height 5' 4.5"  Weight 216 lbs  BMI 65.79  Systolic 038  Diastolic 75  Pulse 86  Respirations 17   In general this is a well appearing female in no acute distress. Oxygen in place by nasal cannula. She's alert and oriented x4 and appropriate throughout the examination.  Lungs are clear to auscultation bilaterally. Heart has regular rate and rhythm. No palpable cervical, supraclavicular, or axillary adenopathy. Abdomen soft, non-tender, normal bowel sounds. Breast exam shows right breast  large and pendulous without mass or nipple discharge. Left breast has bruising in the periareolar area, with no palpable mass, nipple discharge or bleeding.   ECOG = 2  0 - Asymptomatic (Fully active, able to carry on all predisease activities without restriction)  1 - Symptomatic but completely ambulatory (Restricted in physically strenuous activity but ambulatory and able to carry out work of a light or sedentary nature. For example, light housework, office work)  2 - Symptomatic, <50% in bed during the day (Ambulatory and capable of all self care but unable to carry out any work activities. Up and about more than 50% of waking hours)  3 - Symptomatic, >50% in bed, but not bedbound (Capable of only limited self-care, confined to bed or chair 50% or more of waking hours)  4 - Bedbound (Completely disabled. Cannot carry on any self-care. Totally confined to bed or chair)  5 - Death   Eustace Pen MM, Creech RH, Tormey DC, et al. [phone]). "Toxicity and response criteria of the [facility]". [place] Oncol. 5 (6): 649-55  LABORATORY DATA:  Lab Results  Component Value Date   WBC 16.1 (H) 07/06/2017   HGB 12.9 01/13/2017   HCT 40.6 07/06/2017   MCV 88.8 07/06/2017   PLT 233 07/06/2017   NEUTROABS 12.4 (  H) 07/06/2017   Lab Results  Component Value Date   NA 143 07/06/2017   K 3.3 (L) 07/06/2017   CL 102 07/06/2017   CO2 31 (H) 07/06/2017   GLUCOSE 157 (H) 07/06/2017   CREATININE 0.83 07/06/2017   CALCIUM 10.1 07/06/2017      RADIOGRAPHY: US Breast Ltd Uni Left Inc Axilla  Result Date: 06/23/2017 CLINICAL DATA:  Patient was called back from screening mammogram for possible distortion in the left breast. EXAM: 2D DIGITAL DIAGNOSTIC LEFT MAMMOGRAM WITH CAD AND ADJUNCT TOMO ULTRASOUND LEFT BREAST COMPARISON:  Previous exam(s). ACR Breast Density Category b: There are scattered areas of fibroglandular density. FINDINGS: Additional imaging of the left breast was  performed. There is a 9 mm mass with distortion in the anterior third of the upper-outer quadrant. There are no malignant type microcalcifications. On physical exam, I palpate discrete thickening in the left breast at 2:30 2 cm from the nipple. Targeted ultrasound is performed, showing an irregular hypoechoic mass with shadowing in the left breast at 2:30 2 cm from the nipple measuring 6 x 7 x 6 mm. Sonographic evaluation the left axilla does not show any enlarged adenopathy. IMPRESSION: Suspicious left breast mass. RECOMMENDATION: Ultrasound-guided core biopsy of the left breast mass is recommended. The ultrasound-guided core biopsy will be scheduled at the patient's convenience. I have discussed the findings and recommendations with the patient. Results were also provided in writing at the conclusion of the visit. If applicable, a reminder letter will be sent to the patient regarding the next appointment. BI-RADS CATEGORY  4: Suspicious. Electronically Signed   By: Lillia Mountain M.D.   On: 06/22/2017 11:56   Mm Diag Breast Tomo Uni Left  Result Date: 06/22/2017 CLINICAL DATA:  Patient was called back from screening mammogram for possible distortion in the left breast. EXAM: 2D DIGITAL DIAGNOSTIC LEFT MAMMOGRAM WITH CAD AND ADJUNCT TOMO ULTRASOUND LEFT BREAST COMPARISON:  Previous exam(s). ACR Breast Density Category b: There are scattered areas of fibroglandular density. FINDINGS: Additional imaging of the left breast was performed. There is a 9 mm mass with distortion in the anterior third of the upper-outer quadrant. There are no malignant type microcalcifications. On physical exam, I palpate discrete thickening in the left breast at 2:30 2 cm from the nipple. Targeted ultrasound is performed, showing an irregular hypoechoic mass with shadowing in the left breast at 2:30 2 cm from the nipple measuring 6 x 7 x 6 mm. Sonographic evaluation the left axilla does not show any enlarged adenopathy. IMPRESSION:  Suspicious left breast mass. RECOMMENDATION: Ultrasound-guided core biopsy of the left breast mass is recommended. The ultrasound-guided core biopsy will be scheduled at the patient's convenience. I have discussed the findings and recommendations with the patient. Results were also provided in writing at the conclusion of the visit. If applicable, a reminder letter will be sent to the patient regarding the next appointment. BI-RADS CATEGORY  4: Suspicious. Electronically Signed   By: Lillia Mountain M.D.   On: 06/22/2017 11:56   Mm Clip Placement Left  Result Date: 06/24/2017 CLINICAL DATA:  Post ultrasound-guided core needle biopsy of left breast. EXAM: DIAGNOSTIC LEFT MAMMOGRAM POST ULTRASOUND BIOPSY COMPARISON:  Previous exam(s). FINDINGS: Mammographic images were obtained following ultrasound guided biopsy of left breast. Two-view mammography demonstrates ribbon shaped marker within the nodule of interest in the left breast 2:30 o'clock, anterior depth. IMPRESSION: Successful placement of ribbon shaped marker within the left breast, post ultrasound-guided core needle biopsy. Final Assessment: Post Procedure  Mammograms for Marker Placement Electronically Signed   By: Fidela Salisbury M.D.   On: 06/24/2017 08:52   [place] Lt Breast Bx W Loc Dev 1st Lesion Img Bx Spec US Guide  Addendum Date: 06/29/2017   ADDENDUM REPORT: 06/27/2017 13:05 ADDENDUM: Pathology revealed GRADE I INVASIVE MAMMARY CARCINOMA WITH PERINEURAL INVASION, MAMMARY CARCINOMA IN-SITU, PAPILLARY LESION, CALCIFICATIONS of the Left breast, 2:30 o'clock. This was found to be concordant by Dr. Fidela Salisbury. Pathology results were discussed with the patient by telephone. The patient reported doing well after the biopsy with tenderness and bruising at the site. Post biopsy instructions and care were reviewed and questions were answered. The patient was encouraged to call [facility] for any additional concerns. The  patient was referred to [facility] on July 06, 2017. Pathology results reported by Terie Purser, RN on 06/27/2017. Electronically Signed   By: Fidela Salisbury M.D.   On: 06/27/2017 13:05   Result Date: 06/29/2017 CLINICAL DATA:  Left breast 2:30 o'clock nodule. EXAM: ULTRASOUND GUIDED LEFT BREAST CORE NEEDLE BIOPSY COMPARISON:  Previous exam(s). FINDINGS: I met with the patient and we discussed the procedure of ultrasound-guided biopsy, including benefits and alternatives. We discussed the high likelihood of a successful procedure. We discussed the risks of the procedure, including infection, bleeding, tissue injury, clip migration, and inadequate sampling. Informed written consent was given. The usual time-out protocol was performed immediately prior to the procedure. Lesion quadrant: Upper outer quadrant Using sterile technique and 1% Lidocaine as local anesthetic, under direct ultrasound visualization, a 14 gauge spring-loaded device was used to perform biopsy of left breast 2:30 o'clock nodule using a inferior approach. At the conclusion of the procedure a ribbon shaped tissue marker clip was deployed into the biopsy cavity. Follow up 2 view mammogram was performed and dictated separately. IMPRESSION: Ultrasound guided biopsy of left breast.  No apparent complications. Electronically Signed: By: Fidela Salisbury M.D. On: 06/24/2017 08:26      IMPRESSION: 72 year-old woman with clinical Stage I (T1b, N0) invasive mammary carcinoma, ER+ / PR+ / HER-2(-).  The patient will proceed with lumpectomy of the left breast. She is interested in proceeding with adjuvant hormonal therapy. The patient's medical history and oxygen requirements she will not proceed with sentinel node procedure. No plans for radiation therapy at this point unless she is unable to tolerate hormonal therapy or surgical findings are significantly different  than pre-operative imaging.  PLAN: We will see the patient again if she is unable to tolerate hormonal therapy or surgical findings are significantly different than pre-operative findings and radiation is implicated. Otherwise we will see her again as needed.     ------------------------------------------------  Blair Promise, PhD, MD  This document serves as a record of services personally performed by Gery Pray, MD. It was created on his behalf by Arlyce Harman, a trained medical scribe. The creation of this record is based on the scribe's personal observations and the provider's statements to them. This document has been checked and approved by the attending provider.

## 2017-07-06 NOTE — Progress Notes (Signed)
Nutrition Assessment  Reason for Assessment:  Pt seen in Breast Clinic  ASSESSMENT:   72 year old female with new diagnosis of breast cancer.  Past medical history of DM, HTN, AAA, COPD requiring home oxygen, HLD  Patient reports good appetite  Medications:  reviewed  Labs: reviewed  Anthropometrics:   Height: 64.5 inches Weight: 216 lb BMI: 36   NUTRITION DIAGNOSIS: Food and nutrition related knowledge deficit related to new diagnosis of breast cancer as evidenced by no prior need for nutrition related information.  INTERVENTION:   Discussed and provided packet of information regarding nutritional tips for breast cancer patients.  Questions answered.  Teachback method used.  Contact information provided and patient knows to contact me with questions/concerns.    MONITORING, EVALUATION, and GOAL: Pt will consume a healthy plant based diet to maintain lean body mass throughout treatment.   Jamarion [place] B. Zenia Resides, [place], [place] Registered Dietitian [phone] (pager)

## 2017-07-06 NOTE — Telephone Encounter (Signed)
Gave avs and calendar for march °

## 2017-07-06 NOTE — Progress Notes (Signed)
[facility] Psychosocial Distress Screening Clinical Social Work  Clinical Social Work was referred by distress screening protocol.  The patient scored a did not indicate on the Psychosocial Distress Thermometer which indicates unknown distress. Clinical Social Worker Anshul Meddings to assess for distress and other psychosocial needs. CSW and patient discussed common feeling and emotions when being diagnosed with cancer, and the importance of support during treatment. CSW informed patient of the support team and support services at [facility]. CSW provided contact information and encouraged patient to call with any questions or concerns.  Patient reports strong support from faith community and family.  Was accompanied by daughter and "sister/friend."  Works part time in [employer].  Has contact w breast cancer survivors who are a source of strong support.  Good support at home, no transport needs.  Gave packet, encouraged to connect w others as needed and practice positive self care strategies.    ONCBCN DISTRESS SCREENING 07/06/2017  Screening Type Initial Screening  Practical problem type Housing  Emotional problem type Adjusting to illness  Referral to clinical social work Yes     Clinical Social Worker follow up needed: No.  If yes, follow up plan:   Edwyna Shell, LCSW Clinical Social Worker Phone:  [phone]

## 2017-07-07 ENCOUNTER — Telehealth: Payer: Self-pay | Admitting: Oncology

## 2017-07-07 NOTE — Telephone Encounter (Signed)
Called patient regarding 4/1 °

## 2017-07-08 DIAGNOSIS — G4733 Obstructive sleep apnea (adult) (pediatric): Secondary | ICD-10-CM | POA: Diagnosis not present

## 2017-07-12 ENCOUNTER — Telehealth: Payer: Self-pay | Admitting: *Deleted

## 2017-07-12 NOTE — Telephone Encounter (Signed)
  Oncology Nurse Navigator Documentation  Navigator Location: [facility] (07/12/17 1000)   )Navigator Encounter Type: Telephone;[facility] Follow-up (07/12/17 1000) Telephone: Outgoing Call;Clinic/[facility] Follow-up (07/12/17 1000)                                                  Time Spent with Patient: 15 (07/12/17 1000)

## 2017-07-14 DIAGNOSIS — J449 Chronic obstructive pulmonary disease, unspecified: Secondary | ICD-10-CM | POA: Diagnosis not present

## 2017-07-17 ENCOUNTER — Other Ambulatory Visit: Payer: Self-pay | Admitting: Internal Medicine

## 2017-07-17 DIAGNOSIS — E118 Type 2 diabetes mellitus with unspecified complications: Secondary | ICD-10-CM

## 2017-07-17 DIAGNOSIS — I10 Essential (primary) hypertension: Secondary | ICD-10-CM

## 2017-07-17 DIAGNOSIS — E785 Hyperlipidemia, unspecified: Secondary | ICD-10-CM

## 2017-07-20 ENCOUNTER — Ambulatory Visit: Payer: Medicare Other | Admitting: Internal Medicine

## 2017-07-20 ENCOUNTER — Other Ambulatory Visit: Payer: Self-pay | Admitting: General Surgery

## 2017-07-20 ENCOUNTER — Encounter: Payer: Self-pay | Admitting: Internal Medicine

## 2017-07-20 VITALS — BP 150/80 | HR 80 | Ht 64.5 in | Wt 218.4 lb

## 2017-07-20 DIAGNOSIS — C50112 Malignant neoplasm of central portion of left female breast: Secondary | ICD-10-CM

## 2017-07-20 DIAGNOSIS — Z170 Estrogen receptor positive status [ER+]: Principal | ICD-10-CM

## 2017-07-20 DIAGNOSIS — Z01811 Encounter for preprocedural respiratory examination: Secondary | ICD-10-CM | POA: Diagnosis not present

## 2017-07-20 NOTE — Progress Notes (Signed)
Subjective:     Patient ID: Victoria [name], female   DOB: 02/25/1946, 72 y.o.   MRN: [id number]  HPI 72 year old woman, former smoker (50 pack years) with a history of obesity, COPD and emphysema followed by Dr. Chase Caller (last seen 04/15/16), hypertension, obstructive sleep apnea.   Presents for an acute visit today. She reports that 5 weeks ago she developed increased dyspnea, wheezing after being exposed to cold air / weather change. Some cough but non-productive. Has been treated with prednisone x 2 since then, most recently started on 05/30/17, also received doxycycline. She improved some, but now reports recurrent dyspnea and wheeze. She is on Stiolto but is using bid instead of qd, uses DuoNeb or ProAir few times a week.   She is on O2 at 2-3L/min.    PFT reviewed by me 04/15/16 showed very severe obstruction without a bronchodilator response, severely decreased diffusion capacity.     OV 07/20/2017  Chief Complaint  Patient presents with  . Follow-up    Follow up visit. Pt also needing surgical clearance for lumpectomy.  Pt saw RB beginning February annd was givien prednisone. Pt states breathing has been tolerable with not taking prednisone. DME: [employer], 2L continuous at home 3L pulse when out     Follow-up severe COPD patient with obesity and sleep apnea.  She says she is compliant with all her COPD inhalers.  She is also compliant with her CPAP.  She is not smoking anymore.  Overall COPD CAT score is 16.  She uses oxygen all the time and CPAP at night.  She is due for breast surgery lumpectomy and is here for a preoperative evaluation.  She tells me that she is in good nutritional status.  She is not anemic.  She has got good kidney function.  She says the surgery will be elective and less than 2 hours and does not involve operating on the neck or the chest or the abdomen.  It will be done at [facility] and under experience surgeon.  Overall she is very functional and active  despite his severe COPD.  CAT COPD Symptom & Quality of Life Score (GSK trademark) 0 is no burden. 5 is highest burden 07/20/2017   Never Cough -> Cough all the time 2  No phlegm in chest -> Chest is full of phlegm 1  No chest tightness -> Chest feels very tight 2  No dyspnea for 1 flight stairs/hill -> Very dyspneic for 1 flight of stairs 4  No limitations for ADL at home -> Very limited with ADL at home 2  Confident leaving home -> Not at all confident leaving home 0  Sleep soundly -> Do not sleep soundly because of lung condition 2  Lots of Energy -> No energy at all 3  TOTAL Score (max 40)  16     Results for Victoria [name] (MRN [id number]) as of 07/20/2017 10:59  Ref. Range 04/15/2016 13:02  FEV1-Post Latest Units: L 0.65  FEV1-%Pred-Post Latest Units: % 34  FEV1-%Change-Post Latest Units: % 1   Results for Victoria [name] (MRN [id number]) as of 07/20/2017 10:59  Ref. Range 04/15/2016 13:02  DLCO cor Latest Units: ml/min/mmHg 9.47  DLCO cor % pred Latest Units: % 37     has a past medical history of Abdominal aneurysm ([place]), Anemia, Arthritis, COPD (chronic obstructive pulmonary disease) (HCC), Dyspnea, Headache, Heart murmur, Hyperlipidemia, Hypertension, Multiple thyroid nodules, Murmur, cardiac (1950), Osteoporosis, Pneumonia, Pre-diabetes, Requires continuous at home supplemental oxygen,  Sebaceous cyst, Sleep apnea, and Varicella (as child).   reports that she quit smoking about 6 years ago. Her smoking use included cigarettes. She has a 50.00 pack-year smoking history. she has never used smokeless tobacco.  Past Surgical History:  Procedure Laterality Date  . APPENDECTOMY     2008  . [place]  . COLONOSCOPY    . COLONOSCOPY WITH PROPOFOL N/A 04/16/2016   Procedure: COLONOSCOPY WITH PROPOFOL;  Surgeon: Carol Ada, MD;  Location: [facility];  Service: Endoscopy;  Laterality: N/A;  . CYST REMOVAL NECK Left 04/13/2016   Procedure: EXCISION OF SEBACEOUS  CYST LEFT POSTERIOR NECK;  Surgeon: Armandina Gemma, MD;  Location: [place];  Service: General;  Laterality: Left;  . Excision of Pelvic Absess, Right Ovary     2008  . TUBAL LIGATION  1980    Allergies  Allergen Reactions  . Nickel Rash    Severe rash to infection: pt is allergic to all metals other than sterling silver or gold jewelry.   [name] [Olmesartan] Swelling    Swelling of face and arms   . [name] [Valsartan] Swelling    Swelling of face and arms   . Lisinopril Cough  . Codeine Other (See Comments)    jittery  . Hydrocodone-Acetaminophen Nausea And Vomiting  . Monosodium Glutamate Other (See Comments)    Facial swelling per pt  . Lead Acetate Rash    Immunization History  Administered Date(s) Administered  . Influenza Split 01/16/2011  . Influenza, High Dose Seasonal PF 01/13/2017  . Influenza,inj,Quad PF,6+ Mos 02/01/2013, 02/27/2014, 04/07/2015, 01/08/2016  . Pneumococcal Conjugate-13 02/01/2013  . Pneumococcal Polysaccharide-23 10/29/2014  . Td 12/06/2011    Family History  Problem Relation Age of Onset  . Heart disease Mother        MI - fatal  . Hypertension Mother   . Stroke Father 49       fatal  . Alzheimer's disease Father   . Alzheimer's disease Brother   . Hyperlipidemia Brother   . Hypertension Brother   . Diabetes Brother   . Hypertension Brother   . Hyperlipidemia Brother   . Breast cancer Paternal Grandmother      Current Outpatient Medications:  .  Albuterol Sulfate (PROAIR RESPICLICK) 865 (90 Base) MCG/ACT AEPB, Inhale 1-2 puffs into the lungs every 6 (six) hours as needed., Disp: 1 each, Rfl: 5 .  amLODipine (NORVASC) 10 MG tablet, TAKE ONE TABLET BY MOUTH ONCE DAILY, Disp: 90 tablet, Rfl: 0 .  atorvastatin (LIPITOR) 40 MG tablet, TAKE ONE TABLET BY MOUTH ONCE DAILY IN THE MORNING, Disp: 90 tablet, Rfl: 0 .  cholecalciferol (VITAMIN D) 1000 units tablet, Take 2,000 Units by mouth daily., Disp: , Rfl:  .  hydrochlorothiazide  (HYDRODIURIL) 25 MG tablet, Take 1 tablet (25 mg total) by mouth daily., Disp: 90 tablet, Rfl: 3 .  ipratropium-albuterol (DUONEB) 0.5-2.5 (3) MG/3ML SOLN, USE ONE VIAL IN NEBULIZER 4 TIMES DAILY, Disp: 360 mL, Rfl: 5 .  OXYGEN, Inhale 3 L into the lungs. When exerting self , Disp: , Rfl:  .  sodium chloride (OCEAN) 0.65 % SOLN nasal spray, Place 1 spray into both nostrils as needed (dryness)., Disp: , Rfl:  .  Tiotropium Bromide-Olodaterol (STIOLTO RESPIMAT) 2.5-2.5 MCG/ACT AERS, Inhale 2 puffs into the lungs daily., Disp: 3 Inhaler, Rfl: 0 .  tiZANidine (ZANAFLEX) 4 MG tablet, Take 1 tablet (4 mg total) by mouth every 8 (eight) hours as needed for muscle  spasms., Disp: 60 tablet, Rfl: 5 .  potassium chloride SA (K-DUR,KLOR-CON) 20 MEQ tablet, Take 1 tablet (20 mEq total) by mouth 2 (two) times daily. (Patient not taking: Reported on 07/20/2017), Disp: 180 tablet, Rfl: 1 .  predniSONE (DELTASONE) 10 MG tablet, Take 4 tablets for 3 days, 3 tablets for 3 days, 2 tablets for 3 days, 1 tablet daily until you see Dr. Chase Caller (Patient not taking: Reported on 07/20/2017), Disp: 60 tablet, Rfl: 0    Review of Systems     Objective:   Physical Exam  Constitutional: She is oriented to person, place, and time. She appears well-developed and well-nourished. No distress.  HENT:  Head: Normocephalic and atraumatic.  Right Ear: External ear normal.  Left Ear: External ear normal.  Mouth/Throat: Oropharynx is clear and moist. No oropharyngeal exudate.  Eyes: Conjunctivae and EOM are normal. Pupils are equal, round, and reactive to light. Right eye exhibits no discharge. Left eye exhibits no discharge. No scleral icterus.  Neck: Normal range of motion. Neck supple. No JVD present. No tracheal deviation present. No thyromegaly present.  Cardiovascular: Normal rate, regular rhythm, normal heart sounds and intact distal pulses. Exam reveals no gallop and no friction rub.  No murmur heard. Pulmonary/Chest:  Effort normal and breath sounds normal. No respiratory distress. She has no wheezes. She has no rales. She exhibits no tenderness.  Abdominal: Soft. Bowel sounds are normal. She exhibits no distension and no mass. There is no tenderness. There is no rebound and no guarding.  Musculoskeletal: Normal range of motion. She exhibits no edema or tenderness.  Lymphadenopathy:    She has no cervical adenopathy.  Neurological: She is alert and oriented to person, place, and time. She has normal reflexes. No cranial nerve deficit. She exhibits normal muscle tone. Coordination normal.  Skin: Skin is warm and dry. No rash noted. She is not diaphoretic. No erythema. No pallor.  Psychiatric: She has a normal mood and affect. Her behavior is normal. Judgment and thought content normal.  Vitals reviewed.   Vitals:   07/20/17 1011  BP: (!) 154/82  Pulse: 80  SpO2: 95%  Weight: 218 lb 6.4 oz (99.1 kg)  Height: 5' 4.5" (1.638 m)   95% on [facility]  Estimated body mass index is 36.91 kg/m as calculated from the following:   Height as of this encounter: 5' 4.5" (1.638 m).   Weight as of this encounter: 218 lb 6.4 oz (99.1 kg).       Assessment:       ICD-10-CM   1. Preop pulmonary/respiratory exam Z01.811        Plan:      [place] Postperative Pulmonary Risk Score - for vent dependence > 3 days Comment Score  Type of surgery - abd ao aneurysm (27), thoracic (21), neurosurgery / upper abdominal / vascular (21), neck (11) breasst lumptectomy 0  Emergency Surgery - (11)elect elective 0  ALbumin < 3 or poor nutritional state - (9) 3.4 in feb 2019 0  BUN > 30 -  (8) 19 in feb 2019 0  Partial or completely dependent functional status - (7) functioal 0  COPD -  (6) Copd yes 6  Age - 35 to 39 (4), > 70  (6) Age 72 6  TOTAL  12  Risk Stratifcation scores  - < 10, 11-19, 20-27, 28-40, >40  Low - mod risk     CANET Postperative Pulmonary Risk Score -forany complication Comment Score  Age - <50 (0),  50-80 (  3), >80 (16) Age 3 16  Preoperative pulse ox - >96 (0), 91-95 (8), <90 (24) 91% RA at rest 8  Respiratory infection in last month - Yes (17) No but at risk 0  Preoperative anemia - < 10gm% - Yes (11) 13 gm in feb 2019 0  Surgical incision - Upper abdominal (15), Thoracic (24) breast 0  Duration of surgery - <2h (0), 2-3h (16), >3h (23) Reportedly < 2h 0  Emergency Surgery - Yes (8) elective 0  TOTAL  23  Risk Stratification - Low (<26), Intermediate (26-44), High (>45)  low        Risk ameliorating factors are elective surgery, breast surgery, surgery < 2h, good kidney function, good nurtional status, good functional status and absence of anemia and severe hypoxemia on room air at rest   Risk aggravating factors are age, severe copd , and underlying OSA and tendency for copd exaerbation  Overall - low moderate risk for prolonged mech ventilation and low risk for other thinks like atelectasis, pneumonia, PE and Pulmonary edema   Recommend 1. Short duration of surgery as much as possible and avoid paralytic if possible 2. Recovery in step down or ICU with Pulmonary consultation 3. DVT prophylaxis 4. Aggressive pulmonary toilet with o2, bronchodilatation, and incentive spirometry and early ambulation    > 50% of this > 25 min visit spent in face to face counseling or coordination of care    Dr. Brand Males, M.D., [facility].C.P Pulmonary and Critical Care Medicine Staff Physician, [place] Director - Interstitial Lung Disease  Program  Pulmonary [place] [facility] [place], [place], [address]  Pager: [phone], If no answer or between  15:00h - 7:00h: call [phone] Telephone: [phone]

## 2017-07-20 NOTE — Patient Instructions (Addendum)
ICD-10-CM   1. Preop pulmonary/respiratory exam Z01.811     You are at low moderate risk for respiratory complications following lumpectomy However, if you are in a flare up your surgery should be postponed You should take CPAP with you for post op phase Your post recovery should be in the hospital either tele unit or Step down Continue your copd meds through surgery day  Followp - cancel April 2019 OV - 3 months or sooner for copd

## 2017-07-23 DIAGNOSIS — G4733 Obstructive sleep apnea (adult) (pediatric): Secondary | ICD-10-CM | POA: Diagnosis not present

## 2017-07-25 ENCOUNTER — Other Ambulatory Visit: Payer: Self-pay | Admitting: General Surgery

## 2017-07-25 DIAGNOSIS — Z170 Estrogen receptor positive status [ER+]: Principal | ICD-10-CM

## 2017-07-25 DIAGNOSIS — C50112 Malignant neoplasm of central portion of left female breast: Secondary | ICD-10-CM

## 2017-07-25 DIAGNOSIS — E042 Nontoxic multinodular goiter: Secondary | ICD-10-CM | POA: Diagnosis not present

## 2017-07-26 ENCOUNTER — Other Ambulatory Visit: Payer: Self-pay | Admitting: Surgery

## 2017-07-26 DIAGNOSIS — E042 Nontoxic multinodular goiter: Secondary | ICD-10-CM

## 2017-07-27 ENCOUNTER — Telehealth: Payer: Self-pay

## 2017-07-27 NOTE — Telephone Encounter (Signed)
Patient was recently diagnosed with breast cancer and will be scheduled soon for lumpectomy.  Is it ok for patient to have prolia before surgery?----patient states she will not need chemo or radiation---just lumpectomy followed by estrogen block pill for next 5 years----ok to continue prolia with this pill?---routing to dr Ronnald Ramp, please advise, I will call patient back, thanks

## 2017-07-27 NOTE — Telephone Encounter (Signed)
Yes, she can still get prolia

## 2017-07-27 NOTE — Telephone Encounter (Signed)
Left message advising patient of dr Ronnald Ramp note/instructions

## 2017-07-29 ENCOUNTER — Telehealth: Payer: Self-pay

## 2017-07-29 ENCOUNTER — Ambulatory Visit: Payer: Medicare Other

## 2017-07-29 ENCOUNTER — Ambulatory Visit
Admission: RE | Admit: 2017-07-29 | Discharge: 2017-07-29 | Disposition: A | Discharge status: Home / Self Care | Payer: Medicare Other | Source: Ambulatory Visit | Attending: Surgery | Admitting: Surgery

## 2017-07-29 DIAGNOSIS — E042 Nontoxic multinodular goiter: Secondary | ICD-10-CM

## 2017-07-29 MED ORDER — DENOSUMAB 60 MG/ML ~~LOC~~ SOLN: 60.0000 mg | Freq: Once | SUBCUTANEOUS | 1 refills | Status: AC

## 2017-08-01 ENCOUNTER — Ambulatory Visit: Payer: Medicare Other

## 2017-08-02 ENCOUNTER — Encounter: Payer: Self-pay | Admitting: *Deleted

## 2017-08-02 ENCOUNTER — Ambulatory Visit: Payer: Medicare Other | Admitting: Oncology

## 2017-08-02 ENCOUNTER — Ambulatory Visit (INDEPENDENT_AMBULATORY_CARE_PROVIDER_SITE_OTHER): Payer: Medicare Other | Admitting: *Deleted

## 2017-08-02 ENCOUNTER — Ambulatory Visit: Payer: Medicare Other

## 2017-08-02 ENCOUNTER — Telehealth: Payer: Self-pay

## 2017-08-02 DIAGNOSIS — M810 Age-related osteoporosis without current pathological fracture: Secondary | ICD-10-CM | POA: Diagnosis not present

## 2017-08-02 MED ORDER — DENOSUMAB 60 MG/ML ~~LOC~~ SOLN
60.0000 mg | Freq: Once | SUBCUTANEOUS | Status: AC
  Administered 2017-08-02: 60 mg via SUBCUTANEOUS

## 2017-08-02 NOTE — Telephone Encounter (Signed)
Copied from [place] [phone]. Topic: Quick Communication - See Telephone Encounter >> Aug 01, 2017  3:24 PM Antonieta Iba C wrote: CRM for notification. See Telephone encounter for: pt says that she had a prolia injection sent in to pharmacy. Pt says that she went to pick up and was informed that she owe $8.00, pt says that she shouldn't have to pay for Rx because she has a scholarship. Please assist further.   [phone]  08/01/17.  Patient did not save copy of [employer] approval letter---she is going to call [employer] to confirm dates of approval to cover her payment--if she still qualifies, she will get [employer] to send reimbursement form to her and our office will complete form and fax over for reimbursement---patient is going ahead with paying $8, and picking up med at pharmacy and bringing to our office for administration

## 2017-08-05 NOTE — Pre-Procedure Instructions (Addendum)
Victoria [name]  08/05/2017      [place] ([facility] [place] [address] [place] [address] Phone: [phone] Fax: [phone]  [facility] [place], [place] [address] Phone: [phone] Fax: [phone]    Your procedure is scheduled on August 10, 2017.  Report to [facility] Admitting at 800 AM.  Call this number if you have problems the morning of surgery:  [phone]   Remember:  Do not eat food or drink liquids after midnight.  Take these medicines the morning of surgery with A SIP OF WATER albuterol inhaler, Stiolto inhaler-if needed (bring inhalers with you), amlodipine (norvasc), ipratropum-albuterol nebulizer-if needed, prednisone (deltasone)-if needed, ocean nasal spray-if needed, tizanidine (zanaflex)-if needed, portable oxygen-if needed  Please drink 2 Ensure Pre-surgery drinks the night before surgery and 1 Ensure pre-surgery drink before leaving your house the morning of surgery.  Beginning now, STOP taking any Aspirin (unless otherwise instructed by your surgeon), Aleve, Naproxen, Ibuprofen, Motrin, Advil, Goody's, BC's, all herbal medications, fish oil, and all vitamins  Continue all other medications as instructed by your physician except follow the above medication instructions before surgery   Do not wear jewelry, make-up or nail polish.  Do not wear lotions, powders, or perfumes, or deodorant.  Do not shave 48 hours prior to surgery.    Do not bring valuables to the hospital.  [facility] is not responsible for any belongings or valuables.  Contacts, dentures or bridgework may not be worn into surgery.  Leave your suitcase in the car.  After surgery it may be brought to your room.  For patients admitted to the hospital, discharge time will be determined by your treatment team.  Patients discharged the day  of surgery will not be allowed to drive home.   [place]- Preparing For Surgery  Before surgery, you can play an important role. Because skin is not sterile, your skin needs to be as free of germs as possible. You can reduce the number of germs on your skin by washing with CHG (chlorahexidine gluconate) Soap before surgery.  CHG is an antiseptic cleaner which kills germs and bonds with the skin to continue killing germs even after washing.  Please do not use if you have an allergy to CHG or antibacterial soaps. If your skin becomes reddened/irritated stop using the CHG.  Do not shave (including legs and underarms) for at least 48 hours prior to first CHG shower. It is OK to shave your face.  Please follow these instructions carefully.   1. Shower the NIGHT BEFORE SURGERY and the MORNING OF SURGERY with CHG.   2. If you chose to wash your hair, wash your hair first as usual with your normal shampoo.  3. After you shampoo, rinse your hair and body thoroughly to remove the shampoo.  4. Use CHG as you would any other liquid soap. You can apply CHG directly to the skin and wash gently with a scrungie or a clean washcloth.   5. Apply the CHG Soap to your body ONLY FROM THE NECK DOWN.  Do not use on open wounds or open sores. Avoid contact with your eyes, ears, mouth and genitals (private parts). Wash Face and genitals (private parts)  with your normal soap.  6. Wash thoroughly, paying special attention to the area where your surgery will be performed.  7.  Thoroughly rinse your body with warm water from the neck down.  8. DO NOT shower/wash with your normal soap after using and rinsing off the CHG Soap.  9. Pat yourself dry with a CLEAN TOWEL.  10. Wear CLEAN PAJAMAS to bed the night before surgery, wear comfortable clothes the morning of surgery  11. Place CLEAN SHEETS on your bed the night of your first shower and DO NOT SLEEP WITH PETS.  Day of Surgery: Do not apply any  deodorants/lotions. Please wear clean clothes to the hospital/surgery center.    Please read over the following fact sheets that you were given. Pain Booklet, Coughing and Deep Breathing and Surgical Site Infection Prevention

## 2017-08-08 ENCOUNTER — Encounter (HOSPITAL_COMMUNITY)
Admission: RE | Admit: 2017-08-08 | Discharge: 2017-08-08 | Disposition: A | Discharge status: Home / Self Care | Payer: Medicare Other | Source: Ambulatory Visit | Attending: General Surgery | Admitting: General Surgery

## 2017-08-08 ENCOUNTER — Other Ambulatory Visit: Payer: Self-pay

## 2017-08-08 ENCOUNTER — Inpatient Hospital Stay (HOSPITAL_COMMUNITY): Admission: RE | Admit: 2017-08-08 | Payer: Medicare Other | Source: Ambulatory Visit

## 2017-08-08 ENCOUNTER — Encounter (HOSPITAL_COMMUNITY): Payer: Self-pay

## 2017-08-08 DIAGNOSIS — C50412 Malignant neoplasm of upper-outer quadrant of left female breast: Secondary | ICD-10-CM | POA: Diagnosis not present

## 2017-08-08 DIAGNOSIS — M19032 Primary osteoarthritis, left wrist: Secondary | ICD-10-CM | POA: Diagnosis not present

## 2017-08-08 DIAGNOSIS — M1711 Unilateral primary osteoarthritis, right knee: Secondary | ICD-10-CM | POA: Diagnosis not present

## 2017-08-08 DIAGNOSIS — Z170 Estrogen receptor positive status [ER+]: Secondary | ICD-10-CM | POA: Insufficient documentation

## 2017-08-08 DIAGNOSIS — Z01818 Encounter for other preprocedural examination: Secondary | ICD-10-CM | POA: Insufficient documentation

## 2017-08-08 DIAGNOSIS — N62 Hypertrophy of breast: Secondary | ICD-10-CM | POA: Diagnosis not present

## 2017-08-08 DIAGNOSIS — M810 Age-related osteoporosis without current pathological fracture: Secondary | ICD-10-CM | POA: Diagnosis not present

## 2017-08-08 DIAGNOSIS — Z87891 Personal history of nicotine dependence: Secondary | ICD-10-CM | POA: Diagnosis not present

## 2017-08-08 DIAGNOSIS — G4730 Sleep apnea, unspecified: Secondary | ICD-10-CM | POA: Diagnosis not present

## 2017-08-08 DIAGNOSIS — M19031 Primary osteoarthritis, right wrist: Secondary | ICD-10-CM | POA: Diagnosis not present

## 2017-08-08 DIAGNOSIS — Z9981 Dependence on supplemental oxygen: Secondary | ICD-10-CM | POA: Diagnosis not present

## 2017-08-08 DIAGNOSIS — M47816 Spondylosis without myelopathy or radiculopathy, lumbar region: Secondary | ICD-10-CM | POA: Diagnosis not present

## 2017-08-08 DIAGNOSIS — I714 Abdominal aortic aneurysm, without rupture: Secondary | ICD-10-CM | POA: Diagnosis not present

## 2017-08-08 DIAGNOSIS — D242 Benign neoplasm of left breast: Secondary | ICD-10-CM | POA: Diagnosis not present

## 2017-08-08 DIAGNOSIS — M19072 Primary osteoarthritis, left ankle and foot: Secondary | ICD-10-CM | POA: Diagnosis not present

## 2017-08-08 DIAGNOSIS — C50112 Malignant neoplasm of central portion of left female breast: Secondary | ICD-10-CM

## 2017-08-08 DIAGNOSIS — Z885 Allergy status to narcotic agent status: Secondary | ICD-10-CM | POA: Diagnosis not present

## 2017-08-08 DIAGNOSIS — Z888 Allergy status to other drugs, medicaments and biological substances status: Secondary | ICD-10-CM | POA: Diagnosis not present

## 2017-08-08 DIAGNOSIS — G4733 Obstructive sleep apnea (adult) (pediatric): Secondary | ICD-10-CM | POA: Diagnosis not present

## 2017-08-08 DIAGNOSIS — J449 Chronic obstructive pulmonary disease, unspecified: Secondary | ICD-10-CM | POA: Diagnosis not present

## 2017-08-08 DIAGNOSIS — Z6836 Body mass index (BMI) 36.0-36.9, adult: Secondary | ICD-10-CM | POA: Diagnosis not present

## 2017-08-08 DIAGNOSIS — E785 Hyperlipidemia, unspecified: Secondary | ICD-10-CM | POA: Diagnosis not present

## 2017-08-08 DIAGNOSIS — R011 Cardiac murmur, unspecified: Secondary | ICD-10-CM | POA: Diagnosis not present

## 2017-08-08 DIAGNOSIS — I10 Essential (primary) hypertension: Secondary | ICD-10-CM | POA: Diagnosis not present

## 2017-08-08 DIAGNOSIS — Z8249 Family history of ischemic heart disease and other diseases of the circulatory system: Secondary | ICD-10-CM | POA: Diagnosis not present

## 2017-08-08 DIAGNOSIS — Z803 Family history of malignant neoplasm of breast: Secondary | ICD-10-CM | POA: Diagnosis not present

## 2017-08-08 DIAGNOSIS — Z79899 Other long term (current) drug therapy: Secondary | ICD-10-CM | POA: Diagnosis not present

## 2017-08-08 HISTORY — DX: Malignant neoplasm of unspecified site of unspecified female breast: C50.919

## 2017-08-08 LAB — BASIC METABOLIC PANEL
ANION GAP: 11 (ref 5–15)
BUN: 17 mg/dL (ref 6–20)
CHLORIDE: 98 mmol/L — AB (ref 101–111)
CO2: 29 mmol/L (ref 22–32)
Calcium: 9.6 mg/dL (ref 8.9–10.3)
Creatinine, Ser: 0.84 mg/dL (ref 0.44–1.00)
GFR calc Af Amer: >60 mL/min (ref >60)
Glucose, Bld: 156 mg/dL — ABNORMAL HIGH (ref 65–99)
POTASSIUM: 2.9 mmol/L — AB (ref 3.5–5.1)
SODIUM: 138 mmol/L (ref 135–145)

## 2017-08-08 LAB — CBC
HEMATOCRIT: 39.4 % (ref 36.0–46.0)
HEMOGLOBIN: 12.8 g/dL (ref 12.0–15.0)
MCH: 28.4 pg (ref 26.0–34.0)
MCHC: 32.5 g/dL (ref 30.0–36.0)
MCV: 87.4 fL (ref 78.0–100.0)
Platelets: 254 10*3/uL (ref 150–400)
RBC: 4.51 MIL/uL (ref 3.87–5.11)
RDW: 13.9 % (ref 11.5–15.5)
WBC: 11.5 10*3/uL — AB (ref 4.0–10.5)

## 2017-08-08 NOTE — Progress Notes (Signed)
Spoke with the answering service and abnormal K+ 2.9 given. Will given to anesthesia for a follow-up.

## 2017-08-08 NOTE — Progress Notes (Signed)
PCP - Dr. Scarlette Calico  Pulm- Dr. Chase Caller  Cardiologist - Denies  Chest x-ray - 04/18/17 (E)  EKG - 04/18/17 (E)  Stress Test - 12/11/15 (E)  ECHO - 03/19/15 (E)  Cardiac Cath - Denies  Sleep Study - Yes- Positive CPAP - Yes- Told to bring mask DOS  LABS- 08/08/17: CBC, BMP   Anesthesia- No  Pt denies having chest pain, sob, or fever at this time. All instructions explained to the pt, with a verbal understanding of the material. Pt agrees to go over the instructions while at home for a better understanding. The opportunity to ask questions was provided.

## 2017-08-09 ENCOUNTER — Ambulatory Visit
Admission: RE | Admit: 2017-08-09 | Discharge: 2017-08-09 | Disposition: A | Discharge status: Home / Self Care | Payer: Medicare Other | Source: Ambulatory Visit | Attending: General Surgery | Admitting: General Surgery

## 2017-08-09 DIAGNOSIS — Z170 Estrogen receptor positive status [ER+]: Principal | ICD-10-CM

## 2017-08-09 DIAGNOSIS — C50112 Malignant neoplasm of central portion of left female breast: Secondary | ICD-10-CM

## 2017-08-09 DIAGNOSIS — R928 Other abnormal and inconclusive findings on diagnostic imaging of breast: Secondary | ICD-10-CM | POA: Diagnosis not present

## 2017-08-09 NOTE — Progress Notes (Addendum)
Anesthesia Chart Review:  Pt is a 72 year old female scheduled for L breast lumpectomy with radioactive seed localization on 08/10/2017 with Rolm Bookbinder, MD  - PCP is Scarlette Calico, MD - Oncologist is Lurline Del, MD - Pulmonologist is Brand Males, MD who cleared pt for surgery at last office visit 07/20/17.  He documents: "Recommend 1. Short duration of surgery as much as possible and avoid paralytic if possible 2. Recovery in step down or ICU with Pulmonary consultation 3. DVT prophylaxis 4. Aggressive pulmonary toilet with o2, bronchodilatation, and incentive spirometry and early ambulation"   PMH includes:  Heart murmur, HTN, pre-diabetes, hyperlipidemia, thyroid nodules, OSA, COPD, uses 2-3L O2 at all times, AAA. Former smoker (quit 2012). BMI 36.5  Medications include: Albuterol, amlodipine, Lipitor, HCTZ, DuoNeb, potassium, prednisone, Stiolto Respimat  BP (!) 150/66   Pulse 95   Temp 36.6 C   Resp 20   Ht 5' 4.5" (1.638 m)   Wt 216 lb 4.8 oz (98.1 kg)   SpO2 93%   BMI 36.55 kg/m   Preoperative labs reviewed.   - K 2.9.  I notified Sunday Spillers in Dr. Cristal Generous office.  Will recheck K day of surgery.   CXR 04/18/17: No evidence of acute cardiopulmonary disease.  EKG 04/18/17: Sinus rhythm. PVCs. Left axis deviation   US thyroid 07/29/17:  - Nodule 2 is stable. Nodules 4, 5, and 7 are new.  All meet criteria for fine needle aspiration biopsy. - Nodule 3 is stable. Nodules 8, 9, and 11 are new. All meet criteria for annual follow-up - Bilateral dominant nodules are not significantly changed and were previously biopsied. - Final aspiration biopsy is recommended for nodules five and 7.  CT chest lung cancer screening 10/12/16:  - Lung-RADS 2, benign appearance or behavior. Continue annual screening with low-dose chest CT without contrast in 12 months.  AAA duplex 07/08/16:  1.  Aneurysmal dilatation of the distal abdominal aorta with a maximum diameter of 2.4  cm. 2.  Elevated velocity at the origin of the left common iliac artery narrowing that >50% stenosis threshold.  Nuclear stress test 12/11/15:   Nuclear stress EF: 75%. No wall motion abnormalities  There was no ST segment deviation noted during stress.  This is a low risk study. No ischemia identified.  Echo 05/28/14:  - Left ventricle: The cavity size was normal. There was mild concentric hypertrophy. Systolic function was vigorous. Theestimated ejection fraction was in the range of 65% to 70%. Wall motion was normal; there were no regional wall motionabnormalities. Doppler parameters are consistent with abnormalleft ventricular relaxation (grade 1 diastolic dysfunction).Doppler parameters are consistent with elevated ventricularend-diastolic filling pressure. - Aortic valve: Structurally normal valve. There was no regurgitation. - Aorta: The aorta was normal, not dilated, and non-diseased. - Mitral valve: Calcified annulus. Mildly thickened leaflets. There was no significant regurgitation. - Left atrium: The atrium was at the upper limits of normal in size. - Right ventricle: Systolic function was normal. - Right atrium: The atrium was normal in size. - Tricuspid valve: There was no significant regurgitation. - Pulmonic valve: There was no significant regurgitation. - Pulmonary arteries: Systolic pressure was mildly increased. PApeak pressure: 46 mm Hg (S). - Pericardium, extracardiac: The pericardium was normal in appearance.  If labs acceptable day of surgery, I anticipate pt can proceed with surgery as scheduled.   Willeen Cass, FNP-BC [facility] Short Stay Surgical Center/Anesthesiology Phone: [phone] 08/09/2017 10:36 AM

## 2017-08-10 ENCOUNTER — Ambulatory Visit (HOSPITAL_COMMUNITY): Payer: Medicare Other | Admitting: Emergency Medicine

## 2017-08-10 ENCOUNTER — Encounter (HOSPITAL_COMMUNITY)
Admission: RE | Disposition: A | Discharge status: Home / Self Care | Payer: Self-pay | Source: Ambulatory Visit | Attending: General Surgery

## 2017-08-10 ENCOUNTER — Ambulatory Visit: Payer: Medicare Other | Admitting: Oncology

## 2017-08-10 ENCOUNTER — Observation Stay (HOSPITAL_COMMUNITY)
Admission: RE | Admit: 2017-08-10 | Discharge: 2017-08-11 | Disposition: A | Discharge status: Home / Self Care | Payer: Medicare Other | Source: Ambulatory Visit | Attending: General Surgery | Admitting: General Surgery

## 2017-08-10 ENCOUNTER — Ambulatory Visit
Admission: RE | Admit: 2017-08-10 | Discharge: 2017-08-10 | Disposition: A | Discharge status: Home / Self Care | Payer: Medicare Other | Source: Ambulatory Visit | Attending: General Surgery | Admitting: General Surgery

## 2017-08-10 ENCOUNTER — Encounter (HOSPITAL_COMMUNITY): Payer: Self-pay

## 2017-08-10 DIAGNOSIS — Z8249 Family history of ischemic heart disease and other diseases of the circulatory system: Secondary | ICD-10-CM | POA: Insufficient documentation

## 2017-08-10 DIAGNOSIS — I714 Abdominal aortic aneurysm, without rupture: Secondary | ICD-10-CM | POA: Insufficient documentation

## 2017-08-10 DIAGNOSIS — D242 Benign neoplasm of left breast: Secondary | ICD-10-CM | POA: Insufficient documentation

## 2017-08-10 DIAGNOSIS — Z79899 Other long term (current) drug therapy: Secondary | ICD-10-CM | POA: Diagnosis not present

## 2017-08-10 DIAGNOSIS — M47816 Spondylosis without myelopathy or radiculopathy, lumbar region: Secondary | ICD-10-CM | POA: Diagnosis not present

## 2017-08-10 DIAGNOSIS — C50112 Malignant neoplasm of central portion of left female breast: Secondary | ICD-10-CM

## 2017-08-10 DIAGNOSIS — M19031 Primary osteoarthritis, right wrist: Secondary | ICD-10-CM | POA: Insufficient documentation

## 2017-08-10 DIAGNOSIS — Z803 Family history of malignant neoplasm of breast: Secondary | ICD-10-CM | POA: Diagnosis not present

## 2017-08-10 DIAGNOSIS — G4730 Sleep apnea, unspecified: Secondary | ICD-10-CM | POA: Diagnosis not present

## 2017-08-10 DIAGNOSIS — E785 Hyperlipidemia, unspecified: Secondary | ICD-10-CM | POA: Diagnosis not present

## 2017-08-10 DIAGNOSIS — Z885 Allergy status to narcotic agent status: Secondary | ICD-10-CM | POA: Diagnosis not present

## 2017-08-10 DIAGNOSIS — M19032 Primary osteoarthritis, left wrist: Secondary | ICD-10-CM | POA: Diagnosis not present

## 2017-08-10 DIAGNOSIS — N62 Hypertrophy of breast: Secondary | ICD-10-CM | POA: Diagnosis not present

## 2017-08-10 DIAGNOSIS — J449 Chronic obstructive pulmonary disease, unspecified: Secondary | ICD-10-CM | POA: Diagnosis not present

## 2017-08-10 DIAGNOSIS — Z9989 Dependence on other enabling machines and devices: Secondary | ICD-10-CM | POA: Insufficient documentation

## 2017-08-10 DIAGNOSIS — R011 Cardiac murmur, unspecified: Secondary | ICD-10-CM | POA: Diagnosis not present

## 2017-08-10 DIAGNOSIS — D0512 Intraductal carcinoma in situ of left breast: Secondary | ICD-10-CM | POA: Diagnosis not present

## 2017-08-10 DIAGNOSIS — C50412 Malignant neoplasm of upper-outer quadrant of left female breast: Principal | ICD-10-CM | POA: Insufficient documentation

## 2017-08-10 DIAGNOSIS — C50912 Malignant neoplasm of unspecified site of left female breast: Secondary | ICD-10-CM | POA: Diagnosis present

## 2017-08-10 DIAGNOSIS — I10 Essential (primary) hypertension: Secondary | ICD-10-CM | POA: Diagnosis not present

## 2017-08-10 DIAGNOSIS — Z6836 Body mass index (BMI) 36.0-36.9, adult: Secondary | ICD-10-CM | POA: Insufficient documentation

## 2017-08-10 DIAGNOSIS — M19072 Primary osteoarthritis, left ankle and foot: Secondary | ICD-10-CM | POA: Insufficient documentation

## 2017-08-10 DIAGNOSIS — M1711 Unilateral primary osteoarthritis, right knee: Secondary | ICD-10-CM | POA: Diagnosis not present

## 2017-08-10 DIAGNOSIS — R928 Other abnormal and inconclusive findings on diagnostic imaging of breast: Secondary | ICD-10-CM | POA: Diagnosis not present

## 2017-08-10 DIAGNOSIS — J9611 Chronic respiratory failure with hypoxia: Secondary | ICD-10-CM | POA: Diagnosis not present

## 2017-08-10 DIAGNOSIS — Z9981 Dependence on supplemental oxygen: Secondary | ICD-10-CM | POA: Diagnosis not present

## 2017-08-10 DIAGNOSIS — Z170 Estrogen receptor positive status [ER+]: Principal | ICD-10-CM

## 2017-08-10 DIAGNOSIS — M810 Age-related osteoporosis without current pathological fracture: Secondary | ICD-10-CM | POA: Diagnosis not present

## 2017-08-10 DIAGNOSIS — Z888 Allergy status to other drugs, medicaments and biological substances status: Secondary | ICD-10-CM | POA: Diagnosis not present

## 2017-08-10 DIAGNOSIS — Z87891 Personal history of nicotine dependence: Secondary | ICD-10-CM | POA: Diagnosis not present

## 2017-08-10 HISTORY — PX: BREAST LUMPECTOMY WITH RADIOACTIVE SEED LOCALIZATION: SHX6424

## 2017-08-10 LAB — GLUCOSE, CAPILLARY
GLUCOSE-CAPILLARY: 209 mg/dL — AB (ref 65–99)
Glucose-Capillary: 160 mg/dL — ABNORMAL HIGH (ref 65–99)

## 2017-08-10 LAB — POCT I-STAT 4, (NA,K, GLUC, HGB,HCT)
Glucose, Bld: 179 mg/dL — ABNORMAL HIGH (ref 65–99)
HCT: 36 % (ref 36.0–46.0)
HEMOGLOBIN: 12.2 g/dL (ref 12.0–15.0)
Potassium: 3.5 mmol/L (ref 3.5–5.1)
Sodium: 139 mmol/L (ref 135–145)

## 2017-08-10 LAB — MRSA PCR SCREENING: MRSA BY PCR: NEGATIVE

## 2017-08-10 SURGERY — BREAST LUMPECTOMY WITH RADIOACTIVE SEED LOCALIZATION
Anesthesia: General | Site: Breast | Laterality: Left

## 2017-08-10 MED ORDER — ONDANSETRON HCL 4 MG/2ML IJ SOLN
INTRAMUSCULAR | Status: DC | PRN
  Administered 2017-08-10: 4 mg via INTRAVENOUS

## 2017-08-10 MED ORDER — ACETAMINOPHEN 500 MG PO TABS
1000.0000 mg | ORAL_TABLET | ORAL | Status: AC
  Administered 2017-08-10: 1000 mg via ORAL
  Filled 2017-08-10: qty 2

## 2017-08-10 MED ORDER — FENTANYL CITRATE (PF) 100 MCG/2ML IJ SOLN: 25.0000 ug | INTRAMUSCULAR | Status: DC | PRN

## 2017-08-10 MED ORDER — BUPIVACAINE-EPINEPHRINE 0.25% -1:200000 IJ SOLN
INTRAMUSCULAR | Status: DC | PRN
  Administered 2017-08-10: 10 mL

## 2017-08-10 MED ORDER — TIZANIDINE HCL 4 MG PO TABS
4.0000 mg | ORAL_TABLET | Freq: Three times a day (TID) | ORAL | Status: DC | PRN
  Administered 2017-08-10: 4 mg via ORAL
  Filled 2017-08-10 (×4): qty 1

## 2017-08-10 MED ORDER — SALINE SPRAY 0.65 % NA SOLN
1.0000 | NASAL | Status: DC | PRN
  Filled 2017-08-10: qty 44

## 2017-08-10 MED ORDER — 0.9 % SODIUM CHLORIDE (POUR BTL) OPTIME
TOPICAL | Status: DC | PRN
  Administered 2017-08-10: 1000 mL

## 2017-08-10 MED ORDER — PROPOFOL 10 MG/ML IV BOLUS
INTRAVENOUS | Status: DC | PRN
  Administered 2017-08-10: 200 mg via INTRAVENOUS

## 2017-08-10 MED ORDER — MIDAZOLAM HCL 2 MG/2ML IJ SOLN
INTRAMUSCULAR | Status: AC
  Filled 2017-08-10: qty 2

## 2017-08-10 MED ORDER — POTASSIUM CHLORIDE CRYS ER 20 MEQ PO TBCR
20.0000 meq | EXTENDED_RELEASE_TABLET | Freq: Two times a day (BID) | ORAL | Status: DC
  Administered 2017-08-10 – 2017-08-11 (×2): 20 meq via ORAL
  Filled 2017-08-10 (×2): qty 1

## 2017-08-10 MED ORDER — ACETAMINOPHEN 325 MG PO TABS
650.0000 mg | ORAL_TABLET | Freq: Four times a day (QID) | ORAL | Status: DC | PRN
  Administered 2017-08-10: 650 mg via ORAL
  Filled 2017-08-10 (×2): qty 2

## 2017-08-10 MED ORDER — LACTATED RINGERS IV SOLN
INTRAVENOUS | Status: DC | PRN
  Administered 2017-08-10: 10:00:00 via INTRAVENOUS

## 2017-08-10 MED ORDER — BUPIVACAINE-EPINEPHRINE (PF) 0.25% -1:200000 IJ SOLN
INTRAMUSCULAR | Status: AC
  Filled 2017-08-10: qty 30

## 2017-08-10 MED ORDER — SIMETHICONE 80 MG PO CHEW: 40.0000 mg | CHEWABLE_TABLET | Freq: Four times a day (QID) | ORAL | Status: DC | PRN

## 2017-08-10 MED ORDER — CHLORHEXIDINE GLUCONATE CLOTH 2 % EX PADS: 6.0000 | MEDICATED_PAD | Freq: Once | CUTANEOUS | Status: DC

## 2017-08-10 MED ORDER — PROMETHAZINE HCL 25 MG/ML IJ SOLN
6.2500 mg | INTRAMUSCULAR | Status: DC | PRN
  Administered 2017-08-10: 6.25 mg via INTRAVENOUS

## 2017-08-10 MED ORDER — ALBUTEROL SULFATE (2.5 MG/3ML) 0.083% IN NEBU: 2.5000 mg | INHALATION_SOLUTION | Freq: Four times a day (QID) | RESPIRATORY_TRACT | Status: DC | PRN

## 2017-08-10 MED ORDER — HEMOSTATIC AGENTS (NO CHARGE) OPTIME
TOPICAL | Status: DC | PRN
  Administered 2017-08-10: 1 via TOPICAL

## 2017-08-10 MED ORDER — LIDOCAINE 2% (20 MG/ML) 5 ML SYRINGE
INTRAMUSCULAR | Status: DC | PRN
  Administered 2017-08-10: 20 mg via INTRAVENOUS

## 2017-08-10 MED ORDER — MORPHINE SULFATE (PF) 2 MG/ML IV SOLN: 1.0000 mg | INTRAVENOUS | Status: DC | PRN

## 2017-08-10 MED ORDER — ACETAMINOPHEN 650 MG RE SUPP: 650.0000 mg | Freq: Four times a day (QID) | RECTAL | Status: DC | PRN

## 2017-08-10 MED ORDER — ENSURE PRE-SURGERY PO LIQD: 592.0000 mL | Freq: Once | ORAL | Status: DC

## 2017-08-10 MED ORDER — MIDAZOLAM HCL 2 MG/2ML IJ SOLN: 0.5000 mg | Freq: Once | INTRAMUSCULAR | Status: DC | PRN

## 2017-08-10 MED ORDER — ONDANSETRON HCL 4 MG/2ML IJ SOLN: 4.0000 mg | Freq: Four times a day (QID) | INTRAMUSCULAR | Status: DC | PRN

## 2017-08-10 MED ORDER — GABAPENTIN 300 MG PO CAPS
300.0000 mg | ORAL_CAPSULE | ORAL | Status: AC
  Administered 2017-08-10: 300 mg via ORAL
  Filled 2017-08-10: qty 1

## 2017-08-10 MED ORDER — HYDROCHLOROTHIAZIDE 25 MG PO TABS
25.0000 mg | ORAL_TABLET | Freq: Every day | ORAL | Status: DC
  Administered 2017-08-11: 25 mg via ORAL
  Filled 2017-08-10: qty 1

## 2017-08-10 MED ORDER — ORAL CARE MOUTH RINSE
15.0000 mL | Freq: Two times a day (BID) | OROMUCOSAL | Status: DC
  Administered 2017-08-10: 15 mL via OROMUCOSAL

## 2017-08-10 MED ORDER — CEFAZOLIN SODIUM-DEXTROSE 2-4 GM/100ML-% IV SOLN
2.0000 g | INTRAVENOUS | Status: AC
  Administered 2017-08-10: 2 g via INTRAVENOUS
  Filled 2017-08-10: qty 100

## 2017-08-10 MED ORDER — ENOXAPARIN SODIUM 40 MG/0.4ML ~~LOC~~ SOLN: 40.0000 mg | Freq: Every day | SUBCUTANEOUS | Status: DC

## 2017-08-10 MED ORDER — UMECLIDINIUM BROMIDE 62.5 MCG/INH IN AEPB
1.0000 | INHALATION_SPRAY | Freq: Every day | RESPIRATORY_TRACT | Status: DC
  Administered 2017-08-11: 1 via RESPIRATORY_TRACT
  Filled 2017-08-10: qty 7

## 2017-08-10 MED ORDER — ARFORMOTEROL TARTRATE 15 MCG/2ML IN NEBU
15.0000 ug | INHALATION_SOLUTION | Freq: Two times a day (BID) | RESPIRATORY_TRACT | Status: DC
  Administered 2017-08-10 – 2017-08-11 (×2): 15 ug via RESPIRATORY_TRACT
  Filled 2017-08-10 (×2): qty 2

## 2017-08-10 MED ORDER — IPRATROPIUM-ALBUTEROL 0.5-2.5 (3) MG/3ML IN SOLN
3.0000 mL | RESPIRATORY_TRACT | Status: DC | PRN
  Administered 2017-08-11: 3 mL via RESPIRATORY_TRACT
  Filled 2017-08-10: qty 3

## 2017-08-10 MED ORDER — TRAMADOL HCL 50 MG PO TABS
50.0000 mg | ORAL_TABLET | Freq: Four times a day (QID) | ORAL | Status: DC | PRN
  Administered 2017-08-11: 50 mg via ORAL
  Filled 2017-08-10: qty 1

## 2017-08-10 MED ORDER — DEXAMETHASONE SODIUM PHOSPHATE 10 MG/ML IJ SOLN
INTRAMUSCULAR | Status: DC | PRN
  Administered 2017-08-10: 5 mg via INTRAVENOUS

## 2017-08-10 MED ORDER — AMLODIPINE BESYLATE 10 MG PO TABS
10.0000 mg | ORAL_TABLET | Freq: Every day | ORAL | Status: DC
  Administered 2017-08-11: 10 mg via ORAL
  Filled 2017-08-10: qty 1

## 2017-08-10 MED ORDER — ALBUTEROL SULFATE (2.5 MG/3ML) 0.083% IN NEBU
INHALATION_SOLUTION | RESPIRATORY_TRACT | Status: AC
  Filled 2017-08-10: qty 3

## 2017-08-10 MED ORDER — PROMETHAZINE HCL 25 MG/ML IJ SOLN
INTRAMUSCULAR | Status: AC
  Filled 2017-08-10: qty 1

## 2017-08-10 MED ORDER — ALBUTEROL SULFATE 108 (90 BASE) MCG/ACT IN AEPB: 1.0000 | INHALATION_SPRAY | Freq: Four times a day (QID) | RESPIRATORY_TRACT | Status: DC | PRN

## 2017-08-10 MED ORDER — FENTANYL CITRATE (PF) 250 MCG/5ML IJ SOLN
INTRAMUSCULAR | Status: AC
  Filled 2017-08-10: qty 5

## 2017-08-10 MED ORDER — NON FORMULARY: 2.5000 ug | Freq: Two times a day (BID) | Status: DC

## 2017-08-10 MED ORDER — FENTANYL CITRATE (PF) 250 MCG/5ML IJ SOLN
INTRAMUSCULAR | Status: DC | PRN
  Administered 2017-08-10 (×2): 50 ug via INTRAVENOUS
  Administered 2017-08-10: 25 ug via INTRAVENOUS
  Administered 2017-08-10: 50 ug via INTRAVENOUS

## 2017-08-10 MED ORDER — SODIUM CHLORIDE 0.9 % IV SOLN: INTRAVENOUS | Status: DC

## 2017-08-10 MED ORDER — MEPERIDINE HCL 50 MG/ML IJ SOLN: 6.2500 mg | INTRAMUSCULAR | Status: DC | PRN

## 2017-08-10 MED ORDER — ALBUTEROL SULFATE (2.5 MG/3ML) 0.083% IN NEBU
2.5000 mg | INHALATION_SOLUTION | Freq: Once | RESPIRATORY_TRACT | Status: AC
  Administered 2017-08-10: 2.5 mg via RESPIRATORY_TRACT

## 2017-08-10 MED ORDER — ONDANSETRON 4 MG PO TBDP
4.0000 mg | ORAL_TABLET | Freq: Four times a day (QID) | ORAL | Status: DC | PRN
  Filled 2017-08-10: qty 1

## 2017-08-10 SURGICAL SUPPLY — 48 items
APPLIER CLIP 9.375 MED OPEN (MISCELLANEOUS)
BINDER BREAST LRG (GAUZE/BANDAGES/DRESSINGS) IMPLANT
BINDER BREAST XLRG (GAUZE/BANDAGES/DRESSINGS) ×2 IMPLANT
BLADE SURG 15 STRL LF DISP TIS (BLADE) ×1 IMPLANT
BLADE SURG 15 STRL SS (BLADE) ×1
CANISTER SUCT 3000ML PPV (MISCELLANEOUS) ×2 IMPLANT
CHLORAPREP W/TINT 26ML (MISCELLANEOUS) ×2 IMPLANT
CLIP APPLIE 9.375 MED OPEN (MISCELLANEOUS) IMPLANT
COVER PROBE W GEL 5X96 (DRAPES) ×2 IMPLANT
COVER SURGICAL LIGHT HANDLE (MISCELLANEOUS) ×2 IMPLANT
DERMABOND ADVANCED (GAUZE/BANDAGES/DRESSINGS) ×1
DERMABOND ADVANCED .7 DNX12 (GAUZE/BANDAGES/DRESSINGS) ×1 IMPLANT
DEVICE DUBIN SPECIMEN MAMMOGRA (MISCELLANEOUS) ×2 IMPLANT
DRAPE CHEST BREAST 15X10 FENES (DRAPES) ×2 IMPLANT
ELECT COATED BLADE 2.86 ST (ELECTRODE) ×2 IMPLANT
ELECT REM PT RETURN 9FT ADLT (ELECTROSURGICAL) ×2
ELECTRODE REM PT RTRN 9FT ADLT (ELECTROSURGICAL) ×1 IMPLANT
GLOVE BIO SURGEON STRL SZ7 (GLOVE) ×4 IMPLANT
GLOVE BIOGEL PI IND STRL 7.5 (GLOVE) ×1 IMPLANT
GLOVE BIOGEL PI INDICATOR 7.5 (GLOVE) ×1
GOWN STRL REUS W/ TWL LRG LVL3 (GOWN DISPOSABLE) ×2 IMPLANT
GOWN STRL REUS W/TWL LRG LVL3 (GOWN DISPOSABLE) ×2
HEMOSTAT ARISTA ABSORB 3G PWDR (MISCELLANEOUS) ×2 IMPLANT
ILLUMINATOR WAVEGUIDE N/F (MISCELLANEOUS) ×2 IMPLANT
KIT BASIN OR (CUSTOM PROCEDURE TRAY) ×2 IMPLANT
KIT MARKER MARGIN INK (KITS) ×2 IMPLANT
NEEDLE HYPO 25GX1X1/2 BEV (NEEDLE) ×2 IMPLANT
NS IRRIG 1000ML POUR BTL (IV SOLUTION) ×2 IMPLANT
PACK GENERAL/GYN (CUSTOM PROCEDURE TRAY) ×2 IMPLANT
PACK SURGICAL SETUP 50X90 (CUSTOM PROCEDURE TRAY) ×2 IMPLANT
PENCIL BUTTON HOLSTER BLD 10FT (ELECTRODE) ×2 IMPLANT
SPONGE LAP 18X18 X RAY DECT (DISPOSABLE) ×2 IMPLANT
STRIP CLOSURE SKIN 1/2X4 (GAUZE/BANDAGES/DRESSINGS) ×2 IMPLANT
SUT MNCRL AB 4-0 PS2 18 (SUTURE) ×2 IMPLANT
SUT MON AB 5-0 PS2 18 (SUTURE) ×2 IMPLANT
SUT SILK 2 0 SH (SUTURE) IMPLANT
SUT VIC AB 2-0 CT1 27 (SUTURE) ×1
SUT VIC AB 2-0 CT1 TAPERPNT 27 (SUTURE) ×1 IMPLANT
SUT VIC AB 2-0 SH 27 (SUTURE) ×1
SUT VIC AB 2-0 SH 27XBRD (SUTURE) ×1 IMPLANT
SUT VIC AB 3-0 SH 27 (SUTURE) ×1
SUT VIC AB 3-0 SH 27X BRD (SUTURE) ×1 IMPLANT
SYR BULB 3OZ (MISCELLANEOUS) ×2 IMPLANT
SYR CONTROL 10ML LL (SYRINGE) ×2 IMPLANT
TOWEL OR 17X24 6PK STRL BLUE (TOWEL DISPOSABLE) ×2 IMPLANT
TOWEL OR 17X26 10 PK STRL BLUE (TOWEL DISPOSABLE) ×2 IMPLANT
TUBE CONNECTING 12X1/4 (SUCTIONS) IMPLANT
YANKAUER SUCT BULB TIP NO VENT (SUCTIONS) ×2 IMPLANT

## 2017-08-10 NOTE — H&P (View-Only) (Signed)
Victoria [name] is an 72 y.o. female.   Chief Complaint: left breast cancer HPI: 37 yof referred by Dr Sondra Come for new left breast cancer. she has copd and is o2 dependent. she is on prednisone now and is due to see her pulmonologist early march for follow up. she also uses cpap for osa. she has no prior breast surgery. she also has no fh. she had no mass or dc. she underwent screening mm that shows a left breast distortion. this measures on [place] 7x6x6 mm and the [place] of axilla is negative. biopsy shows a idc/dcis with perineural invasion. this is grade 1. This is er/pr pos, her 2 negative and Ki is 5%. she is here with her daughter in law and good friend today she is left handed. due to allergies she would prefer not to have clips placed at surgery  Past Medical History:  Diagnosis Date  . Abdominal aneurysm ([place])    Dr. Oneida Alar follows lLOV 2 ''17 per pt "around 2 cm"  . Anemia    as a child  . Arthritis    left ankle, right knee, right SI joint, wrists, lower back  . Breast cancer in female [facility])    Left  . COPD (chronic obstructive pulmonary disease) ([place])    ephysema-Dr. Chase Caller  . Dyspnea   . Headache    as a child would have terrible headaches during season changes  . 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 Hyperlipidemia   . Hypertension   . Multiple thyroid nodules   . Murmur, cardiac 1950  . Osteoporosis   . Pneumonia   . Pre-diabetes   . Requires continuous at home supplemental oxygen    2 L 24/7  . Sebaceous cyst    hairline sebaceous cyst left posterior neck to be excised 04-13-16 by Dr. Harlow Asa in [facility]  . Sleep apnea    cpap used sometimes, uses oxygen concentrator 2 l/m nasally bedtime  . Varicella as child    Past Surgical History:  Procedure Laterality Date  . APPENDECTOMY     2008  . [place]  . COLONOSCOPY    . COLONOSCOPY WITH PROPOFOL N/A 04/16/2016   Procedure: COLONOSCOPY WITH PROPOFOL;  Surgeon: Carol Ada,  MD;  Location: [facility];  Service: Endoscopy;  Laterality: N/A;  . CYST REMOVAL NECK Left 04/13/2016   Procedure: EXCISION OF SEBACEOUS CYST LEFT POSTERIOR NECK;  Surgeon: Armandina Gemma, MD;  Location: [place];  Service: General;  Laterality: Left;  . Excision of Pelvic Absess, Right Ovary     2008  . TUBAL LIGATION  1980    Family History  Problem Relation Age of Onset  . Heart disease Mother        MI - fatal  . Hypertension Mother   . Stroke Father 74       fatal  . Alzheimer's disease Father   . Alzheimer's disease Brother   . Hyperlipidemia Brother   . Hypertension Brother   . Diabetes Brother   . Hypertension Brother   . Hyperlipidemia Brother   . Breast cancer Paternal Grandmother    Social History:  reports that she quit smoking about 6 years ago. Her smoking use included cigarettes. She has a 50.00 pack-year smoking history. She has never used smokeless tobacco. She reports that she drinks alcohol. She reports that she does not use drugs.  Allergies:  Allergies  Allergen Reactions  .  Benicar [Olmesartan] Swelling    Swelling of face and arms   . [name] [Valsartan] Swelling    Swelling of face and arms   . Lisinopril Cough  . Monosodium Glutamate Other (See Comments)    Facial swelling per pt  . Codeine Other (See Comments)    jittery  . Hydrocodone-Acetaminophen Nausea And Vomiting  . Lead Acetate Rash  . Nickel Rash    Severe rash to infection: pt is allergic to all metals other than sterling silver or gold jewelry.     No medications prior to admission.    Results for orders placed or performed during the hospital encounter of 08/08/17 (from the past 48 hour(s))  Basic metabolic panel     Status: Abnormal   Collection Time: 08/08/17  9:16 AM  Result Value Ref Range   Sodium 138 135 - 145 mmol/L   Potassium 2.9 (L) 3.5 - 5.1 mmol/L   Chloride 98 (L) 101 - 111 mmol/L   CO2 29 22 - 32 mmol/L   Glucose, Bld 156 (H) 65 - 99 mg/dL   BUN 17 6 - 20 mg/dL    Creatinine, Ser 0.84 0.44 - 1.00 mg/dL   Calcium 9.6 8.9 - 10.3 mg/dL   GFR calc non Af Amer >60 >60 mL/min   GFR calc Af Amer >60 >60 mL/min    Comment: (NOTE) The eGFR has been calculated using the CKD EPI equation. This calculation has not been validated in all clinical situations. eGFR's persistently <60 mL/min signify possible Chronic Kidney Disease.    Anion gap 11 5 - 15    Comment: Performed at [facility] [address], [place], [place] [address]  CBC     Status: Abnormal   Collection Time: 08/08/17  9:16 AM  Result Value Ref Range   WBC 11.5 (H) 4.0 - 10.5 K/uL   RBC 4.51 3.87 - 5.11 MIL/uL   Hemoglobin 12.8 12.0 - 15.0 g/dL   HCT 39.4 36.0 - 46.0 %   MCV 87.4 78.0 - 100.0 fL   MCH 28.4 26.0 - 34.0 pg   MCHC 32.5 30.0 - 36.0 g/dL   RDW 13.9 11.5 - 15.5 %   Platelets 254 150 - 400 K/uL    Comment: Performed at [facility], [place] [address] [place], [place] [address]   Mm Lt Radioactive Seed Loc Mammo Guide  Result Date: 08/09/2017 CLINICAL DATA:  Patient for preoperative localization prior to left lumpectomy. EXAM: MAMMOGRAPHIC GUIDED RADIOACTIVE SEED LOCALIZATION OF THE LEFT BREAST COMPARISON:  Previous exam(s). FINDINGS: Patient presents for radioactive seed localization prior to . I met with the patient and we discussed the procedure of seed localization including benefits and alternatives. We discussed the high likelihood of a successful procedure. We discussed the risks of the procedure including infection, bleeding, tissue injury and further surgery. We discussed the low dose of radioactivity involved in the procedure. Informed, written consent was given. The usual time-out protocol was performed immediately prior to the procedure. Using mammographic guidance, sterile technique, 1% lidocaine and an I-125 radioactive seed, mass within the lateral left breast was localized using a lateral approach. The follow-up mammogram images confirm the seed in the expected  location and were marked for Dr. Donne Hazel. Follow-up survey of the patient confirms presence of the radioactive seed. Order number of I-125 seed:  [id number]. Total activity:  8.299 millicuries reference Date: 07/26/2017 The patient tolerated the procedure well and was released from the [facility]. She was  given instructions regarding seed removal. IMPRESSION: Radioactive seed localization left breast. No apparent complications. Electronically Signed   By: Lovey Newcomer M.D.   On: 08/09/2017 16:01    ROS  There were no vitals taken for this visit. Physical Exam  General Mental Status-Alert. Head and Neck Head-normocephalic, atraumatic with no lesions or palpable masses. Trachea-midline. Eye Sclera/Conjunctiva - Bilateral-No scleral icterus. Chest and Lung Exam Chest and lung exam reveals -quiet, even and easy respiratory effort with no use of accessory muscles and on auscultation, normal breath sounds, no adventitious sounds and normal vocal resonance. Breast Nipples-No Discharge. Breast Lump-No Palpable Breast Mass. Cardiovascular Cardiovascular examination reveals -normal heart sounds, regular rate and rhythm with no murmurs and no digital clubbing, cyanosis, edema, increased warmth or tenderness. Abdomen Note: soft nt no hm Neurologic Neurologic evaluation reveals -alert and oriented x 3 with no impairment of recent or remote memory. Lymphatic Head & Neck General Head & Neck Lymphatics: Bilateral - Description - Normal. Axillary General Axillary Region: Bilateral - Description - Normal. Note: no  adenopathy   Assessment/Plan CANCER OF UPPER-OUTER QUADRANT OF FEMALE BREAST (C50.419) Story: Left breast lumpectomy discussed staging and pathophysiology of breast cancer. I will need to discuss with Dr Chase Caller prior to surgery. she requests general for surgery due to anxiety. I do not think with age, comorbidities and tumor being a 7 mm er/pr pos grade I  that she needs sn biopsy. this does not improve her survival and she has a low risk of nodal positivity with this tumor and will not likely benefit from staging axilla. she is agreeable we discussed lumpectomy and mastectomy with pros/cons. I recommended lumpectomy and she is agreeable. we discussed 5% positive margin risk as well as performance with radioactive seed placement. we are going to proceed once I discussed with pulmonary.  Rolm Bookbinder, MD 08/10/2017, 7:48 AM

## 2017-08-10 NOTE — Anesthesia Postprocedure Evaluation (Signed)
Anesthesia Post Note  Patient: Victoria [name]  Procedure(s) Performed: BREAST LUMPECTOMY WITH RADIOACTIVE SEED LOCALIZATION (Left Breast)     Patient location during evaluation: PACU Anesthesia Type: General Level of consciousness: awake and alert, oriented and patient cooperative Pain management: pain level controlled Vital Signs Assessment: post-procedure vital signs reviewed and stable Respiratory status: spontaneous breathing, nonlabored ventilation and respiratory function stable Cardiovascular status: blood pressure returned to baseline and stable Postop Assessment: no apparent nausea or vomiting Anesthetic complications: no    Last Vitals:  Vitals:   08/10/17 0825 08/10/17 1101  BP: (!) 167/67   Pulse:    Resp:    Temp:  36.5 C  SpO2:      Last Pain:  Vitals:   08/10/17 1100  TempSrc:   PainSc: 0-No pain                 Omega Durante,E. Analea Muller

## 2017-08-10 NOTE — Anesthesia Preprocedure Evaluation (Addendum)
Anesthesia Evaluation  Patient identified by MRN, date of birth, ID band Patient awake    Reviewed: Allergy & Precautions, NPO status , [name] Chart, lab work & pertinent test results  History of Anesthesia Complications Negative for: history of anesthetic complications  Airway Mallampati: I  TM Distance: >3 FB Neck ROM: Full    Dental  (+) Dental Advisory Given   Pulmonary sleep apnea, Continuous Positive Airway Pressure Ventilation and Oxygen sleep apnea , COPD (steroids),  COPD inhaler and oxygen dependent, former smoker (quit 2012),    breath sounds clear to auscultation + decreased breath sounds      Cardiovascular hypertension, Pt. on medications (-) angina+ Peripheral Vascular Disease (small AAA)   Rhythm:Regular Rate:Normal  '15 Stress: Normal stress nuclear study. LV Wall Motion:  NL, EF 70% '16 ECHO: EF 65-70%, valves OK   Neuro/Psych negative neurological ROS     GI/Hepatic negative GI ROS, Neg liver ROS,   Endo/Other  Morbid obesity  Renal/GU negative Renal ROS     Musculoskeletal  (+) Arthritis ,   Abdominal (+) + obese,   Peds  Hematology negative hematology ROS (+)   Anesthesia Other Findings   Reproductive/Obstetrics                            Anesthesia Physical Anesthesia Plan  ASA: III  Anesthesia Plan: General   Post-op Pain Management:    Induction: Intravenous  PONV Risk Score and Plan: 3 and Ondansetron, Dexamethasone and Treatment may vary due to age or medical condition  Airway Management Planned: LMA  Additional Equipment:   Intra-op Plan:   Post-operative Plan:   Informed Consent: I have reviewed the patients History and Physical, chart, labs and discussed the procedure including the risks, benefits and alternatives for the proposed anesthesia with the patient or authorized representative who has indicated his/her understanding and acceptance.    Dental advisory given  Plan Discussed with: CRNA and Surgeon  Anesthesia Plan Comments: (Plan routine monitors, GA- LMA OK)        Anesthesia Quick Evaluation

## 2017-08-10 NOTE — Interval H&P Note (Signed)
History and Physical Interval Note:  08/10/2017 9:32 AM  Victoria [name]  has presented today for surgery, with the diagnosis of LEFT BREAST CANCER  The various methods of treatment have been discussed with the patient and family. After consideration of risks, benefits and other options for treatment, the patient has consented to  Procedure(s): BREAST LUMPECTOMY WITH RADIOACTIVE SEED LOCALIZATION (Left) as a surgical intervention .  The patient's history has been reviewed, patient examined, no change in status, stable for surgery.  I have reviewed the patient's chart and labs.  Questions were answered to the patient's satisfaction.     Rolm Bookbinder

## 2017-08-10 NOTE — Anesthesia Procedure Notes (Signed)
Procedure Name: LMA Insertion Date/Time: 08/10/2017 10:08 AM Performed by: Imagene Riches, CRNA Pre-anesthesia Checklist: Patient identified, Emergency Drugs available, Suction available and Patient being monitored Patient Re-evaluated:Patient Re-evaluated prior to induction Oxygen Delivery Method: Circle System Utilized Preoxygenation: Pre-oxygenation with 100% oxygen Induction Type: IV induction Ventilation: Mask ventilation without difficulty LMA: LMA inserted LMA Size: 5.0 Number of attempts: 1 Airway Equipment and Method: Bite block Placement Confirmation: positive ETCO2 Tube secured with: Tape Dental Injury: Teeth and Oropharynx as per pre-operative assessment

## 2017-08-10 NOTE — Op Note (Signed)
Preoperative diagnosis: Left breast cancer, clinical stage 1 Postoperative diagnosis: same as above Procedure:Leftbreast seed guided lumpectomy Surgeon: Dr Serita Grammes [id number] Anes: general  Specimens: leftbreast tissue marked with paint, additional superior, lateral and medial inferior margins marked short superior, long lateral, double deep Complications none Drains none Sponge count correct Dispo to pacu stable  Indications: This is an 28yof with new leftbreast cancer with multiple comorbidities including oxygen dependent copd.We discussed options and have elected to proceed with lumpectomy. In [place] decided she did not need sn biopsy. She had radioactive seed placed prior to beginning and the mammograms were available for review.  Procedure: After informed consent was obtained the patient was taken to the operating room. She was given antibiotics. Sequential compression devices were on her legs. She was then placed under general anesthesia with an LMA. Then she was prepped and draped in the standard sterile surgical fashion. Surgical timeout was then performed.  I then located the seed in thelateralbreastI infiltrated marcaine in the skin and then madeaperiareolarincisionto attempt to hide the scar.I then used the neoprobe to remove the seed and the surrounding tissue with attempt to get clear margins. I did remove some additional margins as the seed was close.  I marked this with paint. MM confirmed removal of seed and theclip.I then obtained hemostasis. This was marked as above.I closed with with 2-0 vicryl to approximate breast tissue. The skin was closed with 3-0 vicryl and 5-0 monocryl. Glue and steristrips were placed.

## 2017-08-10 NOTE — H&P (Signed)
Victoria [name] is an 72 y.o. female.   Chief Complaint: left breast cancer HPI: 10 yof referred by Dr Sondra Come for new left breast cancer. she has copd and is o2 dependent. she is on prednisone now and is due to see her pulmonologist early march for follow up. she also uses cpap for osa. she has no prior breast surgery. she also has no fh. she had no mass or dc. she underwent screening mm that shows a left breast distortion. this measures on [place] 7x6x6 mm and the [place] of axilla is negative. biopsy shows a idc/dcis with perineural invasion. this is grade 1. This is er/pr pos, her 2 negative and Ki is 5%. she is here with her daughter in law and good friend today she is left handed. due to allergies she would prefer not to have clips placed at surgery  Past Medical History:  Diagnosis Date  . Abdominal aneurysm ([place])    Dr. Oneida Alar follows lLOV 2 ''17 per pt "around 2 cm"  . Anemia    as a child  . Arthritis    left ankle, right knee, right SI joint, wrists, lower back  . Breast cancer in female [facility])    Left  . COPD (chronic obstructive pulmonary disease) ([place])    ephysema-Dr. Chase Caller  . Dyspnea   . Headache    as a child would have terrible headaches during season changes  . 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 Hyperlipidemia   . Hypertension   . Multiple thyroid nodules   . Murmur, cardiac 1950  . Osteoporosis   . Pneumonia   . Pre-diabetes   . Requires continuous at home supplemental oxygen    2 L 24/7  . Sebaceous cyst    hairline sebaceous cyst left posterior neck to be excised 04-13-16 by Dr. Harlow Asa in [facility]  . Sleep apnea    cpap used sometimes, uses oxygen concentrator 2 l/m nasally bedtime  . Varicella as child    Past Surgical History:  Procedure Laterality Date  . APPENDECTOMY     2008  . [place]  . COLONOSCOPY    . COLONOSCOPY WITH PROPOFOL N/A 04/16/2016   Procedure: COLONOSCOPY WITH PROPOFOL;  Surgeon: Carol Ada,  MD;  [facility];  Service: Endoscopy;  Laterality: N/A;  . CYST REMOVAL NECK Left 04/13/2016   Procedure: EXCISION OF SEBACEOUS CYST LEFT POSTERIOR NECK;  Surgeon: Armandina Gemma, MD;  Location: [facility];  Service: General;  Laterality: Left;  . Excision of Pelvic Absess, Right Ovary     2008  . TUBAL LIGATION  1980    Family History  Problem Relation Age of Onset  . Heart disease Mother        MI - fatal  . Hypertension Mother   . Stroke Father 51       fatal  . Alzheimer's disease Father   . Alzheimer's disease Brother   . Hyperlipidemia Brother   . Hypertension Brother   . Diabetes Brother   . Hypertension Brother   . Hyperlipidemia Brother   . Breast cancer Paternal Grandmother    Social History:  reports that she quit smoking about 6 years ago. Her smoking use included cigarettes. She has a 50.00 pack-year smoking history. She has never used smokeless tobacco. She reports that she drinks alcohol. She reports that she does not use drugs.  Allergies:  Allergies  Allergen Reactions  .  Benicar [Olmesartan] Swelling    Swelling of face and arms   . [name] [Valsartan] Swelling    Swelling of face and arms   . Lisinopril Cough  . Monosodium Glutamate Other (See Comments)    Facial swelling per pt  . Codeine Other (See Comments)    jittery  . Hydrocodone-Acetaminophen Nausea And Vomiting  . Lead Acetate Rash  . Nickel Rash    Severe rash to infection: pt is allergic to all metals other than sterling silver or gold jewelry.     No medications prior to admission.    Results for orders placed or performed during the hospital encounter of 08/08/17 (from the past 48 hour(s))  Basic metabolic panel     Status: Abnormal   Collection Time: 08/08/17  9:16 AM  Result Value Ref Range   Sodium 138 135 - 145 mmol/L   Potassium 2.9 (L) 3.5 - 5.1 mmol/L   Chloride 98 (L) 101 - 111 mmol/L   CO2 29 22 - 32 mmol/L   Glucose, Bld 156 (H) 65 - 99 mg/dL   BUN 17 6 - 20 mg/dL    Creatinine, Ser 0.84 0.44 - 1.00 mg/dL   Calcium 9.6 8.9 - 10.3 mg/dL   GFR calc non Af Amer >60 >60 mL/min   GFR calc Af Amer >60 >60 mL/min    Comment: (NOTE) The eGFR has been calculated using the CKD EPI equation. This calculation has not been validated in all clinical situations. eGFR's persistently <60 mL/min signify possible Chronic Kidney Disease.    Anion gap 11 5 - 15    Comment: Performed at [facility] [address], [place], [place] [address]  CBC     Status: Abnormal   Collection Time: 08/08/17  9:16 AM  Result Value Ref Range   WBC 11.5 (H) 4.0 - 10.5 K/uL   RBC 4.51 3.87 - 5.11 MIL/uL   Hemoglobin 12.8 12.0 - 15.0 g/dL   HCT 39.4 36.0 - 46.0 %   MCV 87.4 78.0 - 100.0 fL   MCH 28.4 26.0 - 34.0 pg   MCHC 32.5 30.0 - 36.0 g/dL   RDW 13.9 11.5 - 15.5 %   Platelets 254 150 - 400 K/uL    Comment: Performed at [facility], [place] [address] [place], [place] [address]   Mm Lt Radioactive Seed Loc Mammo Guide  Result Date: 08/09/2017 CLINICAL DATA:  Patient for preoperative localization prior to left lumpectomy. EXAM: MAMMOGRAPHIC GUIDED RADIOACTIVE SEED LOCALIZATION OF THE LEFT BREAST COMPARISON:  Previous exam(s). FINDINGS: Patient presents for radioactive seed localization prior to . I met with the patient and we discussed the procedure of seed localization including benefits and alternatives. We discussed the high likelihood of a successful procedure. We discussed the risks of the procedure including infection, bleeding, tissue injury and further surgery. We discussed the low dose of radioactivity involved in the procedure. Informed, written consent was given. The usual time-out protocol was performed immediately prior to the procedure. Using mammographic guidance, sterile technique, 1% lidocaine and an I-125 radioactive seed, mass within the lateral left breast was localized using a lateral approach. The follow-up mammogram images confirm the seed in the expected  location and were marked for Dr. Donne Hazel. Follow-up survey of the patient confirms presence of the radioactive seed. Order number of I-125 seed:  [id number]. Total activity:  8.299 millicuries reference Date: 07/26/2017 The patient tolerated the procedure well and was released from the [facility]. She was  given instructions regarding seed removal. IMPRESSION: Radioactive seed localization left breast. No apparent complications. Electronically Signed   By: Drew  Davis M.D.   On: 08/09/2017 16:01    ROS  There were no vitals taken for this visit. Physical Exam  General Mental Status-Alert. Head and Neck Head-normocephalic, atraumatic with no lesions or palpable masses. Trachea-midline. Eye Sclera/Conjunctiva - Bilateral-No scleral icterus. Chest and Lung Exam Chest and lung exam reveals -quiet, even and easy respiratory effort with no use of accessory muscles and on auscultation, normal breath sounds, no adventitious sounds and normal vocal resonance. Breast Nipples-No Discharge. Breast Lump-No Palpable Breast Mass. Cardiovascular Cardiovascular examination reveals -normal heart sounds, regular rate and rhythm with no murmurs and no digital clubbing, cyanosis, edema, increased warmth or tenderness. Abdomen Note: soft nt no hm Neurologic Neurologic evaluation reveals -alert and oriented x 3 with no impairment of recent or remote memory. Lymphatic Head & Neck General Head & Neck Lymphatics: Bilateral - Description - Normal. Axillary General Axillary Region: Bilateral - Description - Normal. Note: no Briggs adenopathy   Assessment/Plan CANCER OF UPPER-OUTER QUADRANT OF FEMALE BREAST (C50.419) Story: Left breast lumpectomy discussed staging and pathophysiology of breast cancer. I will need to discuss with Dr Ramaswamy prior to surgery. she requests general for surgery due to anxiety. I do not think with age, comorbidities and tumor being a 7 mm er/pr pos grade I  that she needs sn biopsy. this does not improve her survival and she has a low risk of nodal positivity with this tumor and will not likely benefit from staging axilla. she is agreeable we discussed lumpectomy and mastectomy with pros/cons. I recommended lumpectomy and she is agreeable. we discussed 5% positive margin risk as well as performance with radioactive seed placement. we are going to proceed once I discussed with pulmonary.  Emeree Mahler, MD 08/10/2017, 7:48 AM   

## 2017-08-10 NOTE — Discharge Instructions (Signed)
[facility] °Office Phone Number [phone] °POST OP INSTRUCTIONS ° °Always review your discharge instruction sheet given to you by the facility where your surgery was performed. ° °IF YOU HAVE DISABILITY OR FAMILY LEAVE FORMS, YOU MUST BRING THEM TO THE OFFICE FOR PROCESSING.  DO NOT GIVE THEM TO YOUR DOCTOR. ° °1. A prescription for pain medication may be given to you upon discharge.  Take your pain medication as prescribed, if needed.  If narcotic pain medicine is not needed, then you may take acetaminophen (Tylenol), naprosyn (Alleve) or ibuprofen (Advil) as needed. °2. Take your usually prescribed medications unless otherwise directed °3. If you need a refill on your pain medication, please contact your pharmacy.  They will contact our office to request authorization.  Prescriptions will not be filled after 5pm or on week-ends. °4. You should eat very light the first 24 hours after surgery, such as soup, crackers, pudding, etc.  Resume your normal diet the day after surgery. °5. Most patients will experience some swelling and bruising in the breast.  Ice packs and a good support bra will help.  Wear the breast binder provided or a sports bra for 72 hours day and night.  After that wear a sports bra during the day until you return to the office. Swelling and bruising can take several days to resolve.  °6. It is common to experience some constipation if taking pain medication after surgery.  Increasing fluid intake and taking a stool softener will usually help or prevent this problem from occurring.  A mild laxative (Milk of Magnesia or Miralax) should be taken according to package directions if there are no bowel movements after 48 hours. °7. Unless discharge instructions indicate otherwise, you may remove your bandages 48 hours after surgery and you may shower at that time.  You may have steri-strips (small skin tapes) in place directly over the incision.  These strips should be left on the  skin for 7-10 days and will come off on their own.  If your surgeon used skin glue on the incision, you may shower in 24 hours.  The glue will flake off over the next 2-3 weeks.  Any sutures or staples will be removed at the office during your follow-up visit. °8. ACTIVITIES:  You may resume regular daily activities (gradually increasing) beginning the next day.  Wearing a good support bra or sports bra minimizes pain and swelling.  You may have sexual intercourse when it is comfortable. °a. You may drive when you no longer are taking prescription pain medication, you can comfortably wear a seatbelt, and you can safely maneuver your car and apply brakes. °b. RETURN TO WORK:  ______________________________________________________________________________________ °9. You should see your doctor in the office for a follow-up appointment approximately two weeks after your surgery.  Your doctor’s nurse will typically make your follow-up appointment when she calls you with your pathology report.  Expect your pathology report 3-4 business days after your surgery.  You may call to check if you do not hear from us after three days. °10. OTHER INSTRUCTIONS: _______________________________________________________________________________________________ _____________________________________________________________________________________________________________________________________ °_____________________________________________________________________________________________________________________________________ °_____________________________________________________________________________________________________________________________________ ° °WHEN TO CALL DR Shaterrica Territo: °1. Fever over 101.0 °2. Nausea and/or vomiting. °3. Extreme swelling or bruising. °4. Continued bleeding from incision. °5. Increased pain, redness, or drainage from the incision. ° °The clinic staff is available to answer your questions during regular  business hours.  Please don’t hesitate to call and ask to speak to one of the nurses for clinical concerns.  If you   have a medical emergency, go to the nearest emergency room or call 911.  A surgeon from [facility] is always on call at the hospital. ° °For further questions, please visit [email or web address] ° °

## 2017-08-10 NOTE — Transfer of Care (Signed)
Immediate Anesthesia Transfer of Care Note  Patient: Victoria [name]  Procedure(s) Performed: BREAST LUMPECTOMY WITH RADIOACTIVE SEED LOCALIZATION (Left Breast)  Patient Location: PACU  Anesthesia Type:General  Level of Consciousness: drowsy  Airway & Oxygen Therapy: Patient Spontanous Breathing and Patient connected to nasal cannula oxygen  Post-op Assessment: Report given to RN and Post -op Vital signs reviewed and stable  Post vital signs: Reviewed and stable  Last Vitals:  Vitals Value Taken Time  BP 147/87 08/10/2017 11:00 AM  Temp 36.5 C 08/10/2017 11:01 AM  Pulse 87 08/10/2017 11:04 AM  Resp 16 08/10/2017 11:04 AM  SpO2 96 % 08/10/2017 11:04 AM  Vitals shown include unvalidated device data.  Last Pain:  Vitals:   08/10/17 0900  TempSrc:   PainSc: 0-No pain      Patients Stated Pain Goal: 3 (33/58/25 1898)  Complications: No apparent anesthesia complications

## 2017-08-10 NOTE — Progress Notes (Signed)
Pt refusing CPAP at this time. Advised pt to call for RT if she decides she wants to wear one of our machines. RT will continue to monitor.

## 2017-08-11 ENCOUNTER — Other Ambulatory Visit: Payer: Self-pay

## 2017-08-11 ENCOUNTER — Encounter (HOSPITAL_COMMUNITY): Payer: Self-pay | Admitting: General Surgery

## 2017-08-11 ENCOUNTER — Telehealth: Payer: Self-pay | Admitting: Internal Medicine

## 2017-08-11 DIAGNOSIS — M19032 Primary osteoarthritis, left wrist: Secondary | ICD-10-CM | POA: Diagnosis not present

## 2017-08-11 DIAGNOSIS — Z8249 Family history of ischemic heart disease and other diseases of the circulatory system: Secondary | ICD-10-CM | POA: Diagnosis not present

## 2017-08-11 DIAGNOSIS — Z885 Allergy status to narcotic agent status: Secondary | ICD-10-CM | POA: Diagnosis not present

## 2017-08-11 DIAGNOSIS — I714 Abdominal aortic aneurysm, without rupture: Secondary | ICD-10-CM | POA: Diagnosis not present

## 2017-08-11 DIAGNOSIS — M47816 Spondylosis without myelopathy or radiculopathy, lumbar region: Secondary | ICD-10-CM | POA: Diagnosis not present

## 2017-08-11 DIAGNOSIS — M19031 Primary osteoarthritis, right wrist: Secondary | ICD-10-CM | POA: Diagnosis not present

## 2017-08-11 DIAGNOSIS — M810 Age-related osteoporosis without current pathological fracture: Secondary | ICD-10-CM | POA: Diagnosis not present

## 2017-08-11 DIAGNOSIS — Z87891 Personal history of nicotine dependence: Secondary | ICD-10-CM | POA: Diagnosis not present

## 2017-08-11 DIAGNOSIS — Z79899 Other long term (current) drug therapy: Secondary | ICD-10-CM | POA: Diagnosis not present

## 2017-08-11 DIAGNOSIS — C50412 Malignant neoplasm of upper-outer quadrant of left female breast: Secondary | ICD-10-CM | POA: Diagnosis not present

## 2017-08-11 DIAGNOSIS — R011 Cardiac murmur, unspecified: Secondary | ICD-10-CM | POA: Diagnosis not present

## 2017-08-11 DIAGNOSIS — Z803 Family history of malignant neoplasm of breast: Secondary | ICD-10-CM | POA: Diagnosis not present

## 2017-08-11 DIAGNOSIS — M19072 Primary osteoarthritis, left ankle and foot: Secondary | ICD-10-CM | POA: Diagnosis not present

## 2017-08-11 DIAGNOSIS — Z888 Allergy status to other drugs, medicaments and biological substances status: Secondary | ICD-10-CM | POA: Diagnosis not present

## 2017-08-11 DIAGNOSIS — N62 Hypertrophy of breast: Secondary | ICD-10-CM | POA: Diagnosis not present

## 2017-08-11 DIAGNOSIS — J449 Chronic obstructive pulmonary disease, unspecified: Secondary | ICD-10-CM | POA: Diagnosis not present

## 2017-08-11 DIAGNOSIS — I10 Essential (primary) hypertension: Secondary | ICD-10-CM | POA: Diagnosis not present

## 2017-08-11 DIAGNOSIS — M1711 Unilateral primary osteoarthritis, right knee: Secondary | ICD-10-CM | POA: Diagnosis not present

## 2017-08-11 DIAGNOSIS — Z9981 Dependence on supplemental oxygen: Secondary | ICD-10-CM | POA: Diagnosis not present

## 2017-08-11 DIAGNOSIS — E785 Hyperlipidemia, unspecified: Secondary | ICD-10-CM | POA: Diagnosis not present

## 2017-08-11 DIAGNOSIS — D242 Benign neoplasm of left breast: Secondary | ICD-10-CM | POA: Diagnosis not present

## 2017-08-11 DIAGNOSIS — G4730 Sleep apnea, unspecified: Secondary | ICD-10-CM | POA: Diagnosis not present

## 2017-08-11 MED ORDER — TRAMADOL HCL 50 MG PO TABS: 50.0000 mg | ORAL_TABLET | Freq: Four times a day (QID) | ORAL | 0 refills | Status: DC | PRN

## 2017-08-11 NOTE — Telephone Encounter (Signed)
Contacted pt sister. Only informed that I can send in what is due for refill. Sister requested rx for tizanidine. I informed that surgeon would send this in if needed. Tramadol was prescribed by MD giving care in the Hospital.   After reviewing medication list, all meds are up to date.

## 2017-08-11 NOTE — Progress Notes (Addendum)
Pt got up and bathed after breakfast. Pt denies any pain. IV d/c. Pt states she is ready for d/c. Discharge instructions reviewed with pt. Pt has no questions at this time.

## 2017-08-11 NOTE — Telephone Encounter (Signed)
Caller name: Victoria [name] A Relation to pt: sister  Call back number: [phone]  Pharmacy: [facility] [place] [phone] (Phone) [phone] (Fax)    Reason for call:  Victoria [name] currently picking up patient (sister) from the hospital. Currently on her way to the pharmacy to pick up previously prescribed medication. Sister states due to patient medical condition she would like to pick up all medication at the same. Sister would like to speak with "stephanie" with an update regarding if tiZanidine will be sent over to the pharmacy within the hour due to the circumstances, please advise.

## 2017-08-11 NOTE — Care Management Obs Status (Signed)
[place] NOTIFICATION   Patient Details  Name: Victoria [name] MRN: [id number] Date of Birth: 1945-05-31   Medicare Observation Status Notification Given:  Yes    Bethena Roys, RN 08/11/2017, 11:10 AM

## 2017-08-11 NOTE — Care Management Obs Status (Signed)
[place] NOTIFICATION   Patient Details  Name: Victoria [name] MRN: [id number] Date of Birth: [birth year]   Medicare Observation Status Notification Given:       Bethena Roys, RN 08/11/2017, 11:10 AM

## 2017-08-11 NOTE — Discharge Summary (Signed)
Physician Discharge Summary  Patient ID: Victoria [name] MRN: [id number] DOB/AGE: 1946/05/11 72 y.o.  Admit date: 08/10/2017 Discharge date: 08/11/2017  Admission Diagnoses: Copd oxygen dependent Breast cancer htn Hyperlipidemia AAA  Discharge Diagnoses:  Active Problems:   Breast cancer, left [facility])   Discharged Condition: good  Hospital Course: 85 yof with multiple comorbidities who presented with left breast cancer. She underwent lumpectomy after discussion with pulmonary preop. She has done well and is back at her baseline following am.  Will dc home  Consults: None  Significant Diagnostic Studies: none  Treatments: surgery: lumpectomy  Discharge Exam: Blood pressure 124/64, pulse 74, temperature 97.9 F (36.6 C), temperature source Oral, resp. rate 19, height 5' 4.5" (1.638 m), weight 99.2 kg (218 lb 11.2 oz), SpO2 97 %. Resp: clear to auscultation bilaterally  Incision clean without hematoma  Disposition: Discharge disposition: 01-Home or Self Care        Allergies as of 08/11/2017      Reactions   Benicar [olmesartan] Swelling   Swelling of face and arms    [name] [valsartan] Swelling   Swelling of face and arms    Lisinopril Cough   Monosodium Glutamate Other (See Comments)   Facial swelling per pt   Codeine Other (See Comments)   jittery   Hydrocodone-acetaminophen Nausea And Vomiting   Lead Acetate Rash   Nickel Rash   Severe rash to infection: pt is allergic to all metals other than sterling silver or gold jewelry.       Medication List    TAKE these medications   Albuterol Sulfate 108 (90 Base) MCG/ACT Aepb Commonly known as:  PROAIR RESPICLICK Inhale 1-2 puffs into the lungs every 6 (six) hours as needed. What changed:  reasons to take this   amLODipine 10 MG tablet Commonly known as:  NORVASC TAKE ONE TABLET BY MOUTH ONCE DAILY   atorvastatin 40 MG tablet Commonly known as:  LIPITOR TAKE ONE TABLET BY MOUTH ONCE DAILY IN THE MORNING   denosumab 60 MG/ML Soln injection Commonly known as:  PROLIA Inject 60 mg into the skin every 6 (six) months. Administer in upper arm, thigh, or abdomen   hydrochlorothiazide 25 MG tablet Commonly known as:  HYDRODIURIL Take 1 tablet (25 mg total) by mouth daily.   ipratropium-albuterol 0.5-2.5 (3) MG/3ML Soln Commonly known as:  DUONEB USE ONE VIAL IN NEBULIZER 4 TIMES DAILY   OXYGEN Inhale 2-3 L into the lungs continuous. When exerting self   potassium chloride SA 20 MEQ tablet Commonly known as:  K-DUR,KLOR-CON Take 1 tablet (20 mEq total) by mouth 2 (two) times daily. What changed:  when to take this   predniSONE 10 MG tablet Commonly known as:  DELTASONE Take 4 tablets for 3 days, 3 tablets for 3 days, 2 tablets for 3 days, 1 tablet daily until you see Dr. Chase Caller What changed:    how much to take  how to take this  when to take this  reasons to take this  additional instructions   sodium chloride 0.65 % Soln nasal spray Commonly known as:  OCEAN Place 1 spray into both nostrils as needed (dryness).   Tiotropium Bromide-Olodaterol 2.5-2.5 MCG/ACT Aers Commonly known as:  STIOLTO RESPIMAT Inhale 2 puffs into the lungs daily.   tiZANidine 4 MG tablet Commonly known as:  ZANAFLEX Take 1 tablet (4 mg total) by mouth every 8 (eight) hours as needed for muscle spasms.   traMADol 50 MG tablet Commonly known as:  Veatrice Bourbon  Take 1 tablet (50 mg total) by mouth every 6 (six) hours as needed (mild pain).   Vitamin D3 2000 units Tabs Take 4,000 Units by mouth daily.      Follow-up Information    Rolm Bookbinder, MD In 3 weeks.   Specialty:  General Surgery Contact information: [place] [address] [phone]           Signed: Rolm Bookbinder 08/11/2017, 9:17 AM

## 2017-08-11 NOTE — Telephone Encounter (Signed)
Copied from [place] [id number]. Topic: Quick Communication - Rx Refill/Question >> Aug 11, 2017  2:00 PM [name] wrote: Medication: tiZANidine (ZANAFLEX) 4 MG tablet  Pt had lumpectomy yesterday and is doing well and going home today she is having some tenseness across her shoulders and is hoping this could be called in for her today. They did check with the surgeon but he stated she would need to contact her PCP   Preferred Pharmacy (with phone number or street name): [facility] ([place]),  - [place] Agent: Please be advised that RX refills may take up to 3 business days. We ask that you follow-up with your pharmacy.

## 2017-08-12 ENCOUNTER — Telehealth: Payer: Self-pay | Admitting: *Deleted

## 2017-08-12 NOTE — Telephone Encounter (Addendum)
Spoke with Dr Donne Hazel and was told that Dr Ronnald Ramp would need to be the one to refill. Please advise, call patient back at [phone]

## 2017-08-12 NOTE — Telephone Encounter (Signed)
Pt was on TCM report admittd 08/10/17 for left breast cancer. Ptunderwent lumpectomy after discussion with pulmonary preop. Pt D/C 08/11/17,and w/follow-up w/surgeon in 3 weeks.Marland KitchenJohny Chess

## 2017-08-15 ENCOUNTER — Ambulatory Visit (INDEPENDENT_AMBULATORY_CARE_PROVIDER_SITE_OTHER): Payer: Medicare Other | Admitting: Emergency Medicine

## 2017-08-15 ENCOUNTER — Encounter: Payer: Self-pay | Admitting: Emergency Medicine

## 2017-08-15 ENCOUNTER — Other Ambulatory Visit (HOSPITAL_COMMUNITY): Payer: Medicare Other

## 2017-08-15 ENCOUNTER — Ambulatory Visit: Payer: Medicare Other | Admitting: Oncology

## 2017-08-15 VITALS — BP 160/80 | HR 92

## 2017-08-15 DIAGNOSIS — J441 Chronic obstructive pulmonary disease with (acute) exacerbation: Secondary | ICD-10-CM | POA: Diagnosis not present

## 2017-08-15 MED ORDER — AZITHROMYCIN 1 G PO PACK: 1.0000 g | PACK | Freq: Once | ORAL | 0 refills | Status: AC

## 2017-08-15 MED ORDER — PREDNISONE 10 MG PO TABS: ORAL_TABLET | ORAL | 0 refills | Status: DC

## 2017-08-15 NOTE — Progress Notes (Signed)
Subjective:    Patient ID: Victoria [name], female    DOB: 04/04/1946, 72 y.o.   MRN: [id number]  HPI 72 year old woman, former smoker (50 pack years) with a history of obesity, COPD and emphysema followed by Dr. Chase Caller, hypertension, obstructive sleep apnea, new dx of breast CA. Underwent lumpectomy on 08/10/17 w Dr Donne Hazel. She initially did well, but developed dyspnea and wheeze over the last 3 days. She used her nebs with some relief. No fevers, no aches. She is having more nasal congestion and sinus congestion over this time period. Cough is producing small amt white. She is back on her Stiolto.     Review of Systems Past Medical History:  Diagnosis Date  . Abdominal aneurysm ([place])    Dr. Oneida Alar follows lLOV 2 ''17 per pt "around 2 cm"  . Anemia    as a child  . Arthritis    left ankle, right knee, right SI joint, wrists, lower back  . Breast cancer in female [facility])    Left  . COPD (chronic obstructive pulmonary disease) ([place])    ephysema-Dr. Chase Caller  . Dyspnea   . Headache    as a child would have terrible headaches during season changes  . 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 Hyperlipidemia   . Hypertension   . Multiple thyroid nodules   . Murmur, cardiac 1950  . Osteoporosis   . Pneumonia   . Pre-diabetes   . Requires continuous at home supplemental oxygen    2 L 24/7  . Sebaceous cyst    hairline sebaceous cyst left posterior neck to be excised 04-13-16 by Dr. Harlow Asa in [place] [facility]  . Sleep apnea    cpap used sometimes, uses oxygen concentrator 2 l/m nasally bedtime  . Varicella as child     Family History  Problem Relation Age of Onset  . Heart disease Mother        MI - fatal  . Hypertension Mother   . Stroke Father 85       fatal  . Alzheimer's disease Father   . Alzheimer's disease Brother   . Hyperlipidemia Brother   . Hypertension Brother   . Diabetes Brother   . Hypertension Brother   . Hyperlipidemia Brother   . Breast cancer  Paternal Grandmother      Social History   Socioeconomic History  . Marital status: Divorced    Spouse name: Not on file  . Number of children: 1  . Years of education: 71  . Highest education level: Not on file  Occupational History  . Occupation: Social worker, Technical sales engineer: [employer]  Social Needs  . Financial resource strain: Somewhat hard  . Food insecurity:    Worry: Sometimes true    Inability: Sometimes true  . Transportation needs:    Medical: No    Non-medical: No  Tobacco Use  . Smoking status: Former Smoker    Packs/day: 1.00    Years: 50.00    Pack years: 50.00    Types: Cigarettes    Last attempt to quit: 04/16/2011    Years since quitting: 6.3  . Smokeless tobacco: Never Used  . Tobacco comment: quit that date when she had to go to ER   Substance and Sexual Activity  . Alcohol use: Yes    Alcohol/week: 0.0 oz    Comment: rare occasion  . Drug use: No    Comment: no marijuana since 2012  .  Sexual activity: Never  Lifestyle  . Physical activity:    Days per week: 3 days    Minutes per session: 30 min  . Stress: Only a little  Relationships  . Social connections:    Talks on phone: More than three times a week    Gets together: More than three times a week    Attends religious service: More than 4 times per year    Active member of club or organization: Not on file    Attends meetings of clubs or organizations: More than 4 times per year    Relationship status: Divorced  . Intimate partner violence:    Fear of current or ex partner: Not on file    Emotionally abused: Not on file    Physically abused: Not on file    Forced sexual activity: Not on file  Other Topics Concern  . Not on file  Social History Narrative   HSG; [name], [place]. . Married '69 - 9 yrs/divorced. 1 son - '72; no grandchildren.   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 No h/o physical or sexual abuse. ACP  - no living will - wants information. Provided packet of information. On 08/05/2011: she stated she was distant cousins to celebrities [name] and [name]        Allergies  Allergen Reactions  . Benicar [Olmesartan] Swelling    Swelling of face and arms   . [name] [Valsartan] Swelling    Swelling of face and arms   . Lisinopril Cough  . Monosodium Glutamate Other (See Comments)    Facial swelling per pt  . Codeine Other (See Comments)    jittery  . Hydrocodone-Acetaminophen Nausea And Vomiting  . Lead Acetate Rash  . Nickel Rash    Severe rash to infection: pt is allergic to all metals other than sterling silver or gold jewelry.      Outpatient Medications Prior to Visit  Medication Sig Dispense Refill  . Albuterol Sulfate (PROAIR RESPICLICK) 841 (90 Base) MCG/ACT AEPB Inhale 1-2 puffs into the lungs every 6 (six) hours as needed. (Patient taking differently: Inhale 1-2 puffs into the lungs every 6 (six) hours as needed (for wheezing/shortness of breath). ) 1 each 5  . amLODipine (NORVASC) 10 MG tablet TAKE ONE TABLET BY MOUTH ONCE DAILY 90 tablet 0  . atorvastatin (LIPITOR) 40 MG tablet TAKE ONE TABLET BY MOUTH ONCE DAILY IN THE MORNING 90 tablet 0  . Cholecalciferol (VITAMIN D3) 2000 units TABS Take 4,000 Units by mouth daily.    [name] denosumab (PROLIA) 60 MG/ML SOLN injection Inject 60 mg into the skin every 6 (six) months. Administer in upper arm, thigh, or abdomen    . hydrochlorothiazide (HYDRODIURIL) 25 MG tablet Take 1 tablet (25 mg total) by mouth daily. 90 tablet 3  . ipratropium-albuterol (DUONEB) 0.5-2.5 (3) MG/3ML SOLN USE ONE VIAL IN NEBULIZER 4 TIMES DAILY 360 mL 5  . OXYGEN Inhale 2-3 L into the lungs continuous. When exerting self     . potassium chloride SA (K-DUR,KLOR-CON) 20 MEQ tablet Take 1 tablet (20 mEq total) by mouth 2 (two) times daily. (Patient taking differently: Take 20 mEq by mouth 2 (two) times a week. ) 180 tablet 1  . sodium chloride (OCEAN)  0.65 % SOLN nasal spray Place 1 spray into both nostrils as needed (dryness).    . Tiotropium Bromide-Olodaterol (STIOLTO RESPIMAT) 2.5-2.5 MCG/ACT AERS Inhale 2 puffs into the lungs daily. 3 Inhaler 0  .  tiZANidine (ZANAFLEX) 4 MG tablet Take 1 tablet (4 mg total) by mouth every 8 (eight) hours as needed for muscle spasms. 60 tablet 5  . traMADol (ULTRAM) 50 MG tablet Take 1 tablet (50 mg total) by mouth every 6 (six) hours as needed (mild pain). 10 tablet 0  . predniSONE (DELTASONE) 10 MG tablet Take 4 tablets for 3 days, 3 tablets for 3 days, 2 tablets for 3 days, 1 tablet daily until you see Dr. Chase Caller (Patient taking differently: Take 10-20 mg by mouth daily as needed (for respiratory difficulty). ) 60 tablet 0   No facility-administered medications prior to visit.         Objective:   Physical Exam Vitals:   08/15/17 1152  BP: (!) 160/80  Pulse: 92  SpO2: 97%   Gen: Pleasant, obese woman, in no distress,  normal affect  ENT: No lesions,  mouth clear,  oropharynx clear, no postnasal drip  Neck: No JVD, no stridor  Lungs: No use of accessory muscles, bilateral diffuse exp wheezes  Cardiovascular: RRR, heart sounds normal, no murmur or gallops, no peripheral edema  Musculoskeletal: No deformities, no cyanosis or clubbing  Neuro: alert, non focal  Skin: Warm, no lesions or rash       Assessment & Plan:  COPD, severe (HCC) With an acute exacerbation, active wheezing. I am a bit hesitant to treat w pred immediately post-op but she is has active bronchospasm so I believe the benefits outweigh the risks.  She mentions today that she had better relief from her nebulizers while she was in the hospital, shows me a package for Garlon Hatchet which she is interested in looking at in the future.  I asked her to review this with Dr. Chase Caller especially if we do not believe the Stiolto is treating her as well.  Please take prednisone as directed until completely gone. Please take  azithromycin as directed until completely gone. Continue your Stiolto 2 puffs once a day. Depending on how you improve you may want to revisit  with Dr. Chase Caller for potential to change to nebulized medications since you got better results from these while you are hospitalized.  Please call our office to let us know how you are progressing before the end of this week. Follow with Dr Chase Caller or APP in the next month  Baltazar Apo, MD, PhD 08/15/2017, 12:17 PM [facility] [phone] or if no answer [phone]

## 2017-08-15 NOTE — Telephone Encounter (Signed)
Pt is requesting rx for tizanidine? Please advise

## 2017-08-15 NOTE — Assessment & Plan Note (Signed)
With an acute exacerbation, active wheezing. I am a bit hesitant to treat w pred immediately post-op but she is has active bronchospasm so I believe the benefits outweigh the risks.  She mentions today that she had better relief from her nebulizers while she was in the hospital, shows me a package for Garlon Hatchet which she is interested in looking at in the future.  I asked her to review this with Dr. Chase Caller especially if we do not believe the Stiolto is treating her as well.  Please take prednisone as directed until completely gone. Please take azithromycin as directed until completely gone. Continue your Stiolto 2 puffs once a day. Depending on how you improve you may want to revisit  with Dr. Chase Caller for potential to change to nebulized medications since you got better results from these while you are hospitalized.  Please call our office to let us know how you are progressing before the end of this week. Follow with Dr Chase Caller or APP in the next month

## 2017-08-15 NOTE — Patient Instructions (Signed)
Please take prednisone as directed until completely gone. Please take azithromycin as directed until completely gone. Continue your Stiolto 2 puffs once a day. Depending on how you improve you may want to revisit  with Dr. Chase Caller for potential to change to nebulized medications since you got better results from these while you are hospitalized.  Please call our office to let us know how you are progressing before the end of this week. Follow with Dr Chase Caller or APP in the next month

## 2017-08-16 ENCOUNTER — Other Ambulatory Visit: Payer: Self-pay | Admitting: Internal Medicine

## 2017-08-16 ENCOUNTER — Ambulatory Visit: Payer: Medicare Other | Admitting: Internal Medicine

## 2017-08-16 DIAGNOSIS — M5136 Other intervertebral disc degeneration, lumbar region: Secondary | ICD-10-CM

## 2017-08-16 MED ORDER — TIZANIDINE HCL 4 MG PO TABS: 4.0000 mg | ORAL_TABLET | Freq: Three times a day (TID) | ORAL | 5 refills | Status: DC | PRN

## 2017-08-17 ENCOUNTER — Encounter: Payer: Self-pay | Admitting: Internal Medicine

## 2017-08-17 DIAGNOSIS — E042 Nontoxic multinodular goiter: Secondary | ICD-10-CM | POA: Diagnosis not present

## 2017-08-19 ENCOUNTER — Other Ambulatory Visit: Payer: Self-pay | Admitting: General Surgery

## 2017-08-19 DIAGNOSIS — E042 Nontoxic multinodular goiter: Secondary | ICD-10-CM | POA: Diagnosis not present

## 2017-08-22 ENCOUNTER — Other Ambulatory Visit: Payer: Self-pay | Admitting: Surgery

## 2017-08-22 DIAGNOSIS — E042 Nontoxic multinodular goiter: Secondary | ICD-10-CM

## 2017-08-23 DIAGNOSIS — G4733 Obstructive sleep apnea (adult) (pediatric): Secondary | ICD-10-CM | POA: Diagnosis not present

## 2017-08-29 NOTE — Progress Notes (Signed)
[place]  [phone] [phone]     ID: Victoria [name] DOB: 1946/03/08  MR#: [id number]  DHR#:[id number]  Patient Care Team: Janith Lima, MD as PCP - General (Internal Medicine) Brand Males, MD as Consulting Physician (Pulmonary Disease) Arvella Nigh, MD as Consulting Physician (Obstetrics and Gynecology) Renato Shin, MD as Consulting Physician (Endocrinology) Thy Gullikson, Virgie Dad, MD as Consulting Physician (Oncology) Rolm Bookbinder, MD as Consulting Physician (General Surgery) Gery Pray, MD as Consulting Physician (Radiation Oncology) OTHER MD:  CHIEF COMPLAINT: Estrogen receptor positive breast cancer   CURRENT TREATMENT: Anastrozole; additional surgery pending   HISTORY OF CURRENT ILLNESS: Victoria [name] had routine screening mammography on 06/16/2017 showing a possible abnormality in the left breast. She underwent unilateral left diagnostic mammography with tomography and left breast ultrasonography at [facility] on 06/22/2017 showing: breast density category B. Suspicious left breast mass in the 2:30 position upper outer quadrant measuring 0.6 x 0.7 x 0.6 cm located 2 cm from the nipple. Palpable thickening with distortion. There was no sonographic evidence of lest axillary lymphadenopathy.  Accordingly on 06/24/2017 she proceeded to biopsy of the left breast mass in question. The pathology from this procedure showed ([id number]):  Invasive mammary carcinoma, grade 1. E-cadherin is positive supporting Ductal carcinoma diagnosis. Prognostic indicators significant for: estrogen receptor, 100% positive and progesterone receptor, 100% positive, both with strong staining intensity. Proliferation marker Ki67 at 5%. HER2 not amplified with ratios of HER2/CEP17 signals 1.21 and average copies per cell 2.00.  The patient's subsequent history is as detailed below.  INTERVAL HISTORY: Victoria [name] presents to the office today for follow  up of her estrogen receptor positive breast cancer   Since her last visit to the office, she underwent left lumpectomy ([id number]) on 08/10/2017 with results showing: Invasive ductal carcinoma, grade 2, measuring 1.2 cm in greatest dimension. Ductal carcinoma in situ, high grade, solid and cribriform type. The anterior margin is focally positive for invasive carcinoma; other margins are negative. Negative for lymphovascular or perineural invasion. Left medial margin with focus of DCIS, 0.1 cm. Negative for invasive carcinoma. Left inferior and lateral margin with focus of ALH, 0.1 cm. Negative for carcinoma. Left superior margin with small intraductal papilloma, 1.5 cm. Negative for invasive carcinoma.   She reports that she had a little pain after surgery. She was given tramadol for the pain, with she tolerated well. She denies issues with bleeding, fever, or constipation. She will have final margin clearing surgery on 08/31/2017 under Dr. Donne Hazel.   She also had an US Thyroid completed on 07/29/2017 with results showing: Nodules 2 and 3 are stable and meets criteria for fine needle aspiration biopsy. Nodules 4, 5, 7, 8, 9, and 11 are new and meets criteria for fine needle aspiration biopsy. Bilateral dominant nodules are not significantly changed and were previously biopsied.  She is working with Dr. Estell Harpin regarding further evaluation and possible resection  Her case was again presented at the multidisciplinary breast cancer conference on 08/24/2017.  At that point it was decided that she would need reexcision.  This is planned for tomorrow.  Given her COPD we felt she was not a candidate for chemo and radiation was unlikely and that she would go on anastrozole.  She is here to discuss that today.   REVIEW OF SYSTEMS: Victoria [name] reports that she went grocery shopping and made dinner. She visited her son and daughter-in-law's house to visit. She denies unusual headaches, visual changes, nausea, vomiting,  or dizziness.  There has been no unusual cough, phlegm production, or pleurisy. This been no change in bowel or bladder habits. She denies unexplained fatigue or unexplained weight loss, bleeding, rash, or fever. A detailed review of systems was otherwise stable.    PAST MEDICAL HISTORY: Past Medical History:  Diagnosis Date  . Abdominal aneurysm ([place])    Dr. Oneida Alar follows lLOV 2 ''17 per pt "around 2 cm"  . Anemia    as a child  . Arthritis    left ankle, right knee, right SI joint, wrists, lower back  . Breast cancer in female [facility])    Left  . COPD (chronic obstructive pulmonary disease) ([place])    ephysema-Dr. Chase Caller  . Dyspnea   . Headache    as a child would have terrible headaches during season changes  . 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 Hyperlipidemia   . Hypertension   . Multiple thyroid nodules   . Murmur, cardiac 1950  . Osteoporosis   . Pneumonia   . Pre-diabetes   . Requires continuous at home supplemental oxygen    2 L 24/7  . Sebaceous cyst    hairline sebaceous cyst left posterior neck to be excised 04-13-16 by Dr. Harlow Asa in [place] [facility]  . Sleep apnea    cpap used sometimes, uses oxygen concentrator 2 l/m nasally bedtime  . Varicella as child    PAST SURGICAL HISTORY: Past Surgical History:  Procedure Laterality Date  . APPENDECTOMY     2008  . BREAST LUMPECTOMY WITH RADIOACTIVE SEED LOCALIZATION Left 08/10/2017   Procedure: BREAST LUMPECTOMY WITH RADIOACTIVE SEED LOCALIZATION;  Surgeon: Rolm Bookbinder, MD;  Location: [place] [facility];  Service: General;  Laterality: Left;  . [place]  . COLONOSCOPY    . COLONOSCOPY WITH PROPOFOL N/A 04/16/2016   Procedure: COLONOSCOPY WITH PROPOFOL;  Surgeon: Carol Ada, MD;  Location: [facility];  Service: Endoscopy;  Laterality: N/A;  . CYST REMOVAL NECK Left 04/13/2016   Procedure: EXCISION OF SEBACEOUS CYST LEFT POSTERIOR NECK;  Surgeon: Armandina Gemma, MD;  Location: [place] [facility];  Service:  General;  Laterality: Left;  . Excision of Pelvic Absess, Right Ovary     2008  . TUBAL LIGATION  1980    FAMILY HISTORY Family History  Problem Relation Age of Onset  . Heart disease Mother        MI - fatal  . Hypertension Mother   . Stroke Father 67       fatal  . Alzheimer's disease Father   . Alzheimer's disease Brother   . Hyperlipidemia Brother   . Hypertension Brother   . Diabetes Brother   . Hypertension Brother   . Hyperlipidemia Brother   . Breast cancer Paternal Grandmother   The patient's father died at age 72 due to a stroke and prostate cancer.  The patient's mother died at age 91 due to a heart attack and blood clot. The patient has 3 brothers and no sisters. She notes a paternal grandmother who was diagnosed with breast cancer at age 2. She notes a paternal cousin with lung cancer. She denies a family history of ovarian cancer.  GYNECOLOGIC HISTORY:  No LMP recorded. Patient is postmenopausal. Menarche: 72 years old Age at first live birth: 72 years old [place] P1 LMP: age 39 The patient had a right ovarian cyst which required removal. She notes that she still has her left ovary. She was on HRT for  a short amount of time, but discontinued due to negative side affects. She was placed on Estroven as well.    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 She is divorced. The patient's son, Smera Guyette, is disabled and works part-time at [employer] in [place]. The patient has 3 grandchildren and 3 great-grandchildren. She attends [employer].      ADVANCED DIRECTIVES:  Son, Eliseo Gum and daughter in law, Butch Penny   HEALTH MAINTENANCE: Social History   Tobacco Use  . Smoking status: Former Smoker    Packs/day: 1.00    Years: 50.00    Pack years: 50.00    Types:  Cigarettes    Last attempt to quit: 04/16/2011    Years since quitting: 6.3  . Smokeless tobacco: Never Used  . Tobacco comment: quit that date when she had to go to ER   Substance Use Topics  . Alcohol use: Yes    Alcohol/week: 0.0 oz    Comment: rare occasion  . Drug use: No    Comment: no marijuana since 2012     Colonoscopy: December 2017/ Dr. Benson Norway  PAP: September 2016  Bone density: 12/08/2016 showed a T score of -2.4   Allergies  Allergen Reactions  . Benicar [Olmesartan] Swelling    Swelling of face and arms   . [name] [Valsartan] Swelling    Swelling of face and arms   . Lisinopril Cough  . Monosodium Glutamate Other (See Comments)    Facial swelling per pt  . Codeine Other (See Comments)    jittery  . Hydrocodone-Acetaminophen Nausea And Vomiting  . Lead Acetate Rash  . Nickel Rash    Severe rash to infection: pt is allergic to all metals other than sterling silver or gold jewelry.     Current Outpatient Medications  Medication Sig Dispense Refill  . acetaminophen (TYLENOL) 500 MG tablet Take 1,000 mg by mouth every 6 (six) hours as needed.    . Albuterol Sulfate (PROAIR RESPICLICK) 170 (90 Base) MCG/ACT AEPB Inhale 1-2 puffs into the lungs every 6 (six) hours as needed. (Patient taking differently: Inhale 1-2 puffs into the lungs every 6 (six) hours as needed (for wheezing/shortness of breath). ) 1 each 5  . amLODipine (NORVASC) 10 MG tablet TAKE ONE TABLET BY MOUTH ONCE DAILY 90 tablet 0  . anastrozole (ARIMIDEX) 1 MG tablet Take 1 tablet (1 mg total) by mouth daily. 90 tablet 4  . atorvastatin (LIPITOR) 40 MG tablet TAKE ONE TABLET BY MOUTH ONCE DAILY IN THE MORNING 90 tablet 0  . Cholecalciferol (VITAMIN D3) 2000 units TABS Take 4,000 Units by mouth daily.    [name] denosumab (PROLIA) 60 MG/ML SOLN injection Inject 60 mg into the skin every 6 (six) months. Administer in upper arm, thigh, or abdomen    . hydrochlorothiazide (HYDRODIURIL) 25 MG tablet Take 1 tablet  (25 mg total) by mouth daily. 90 tablet 3  . ipratropium-albuterol (DUONEB) 0.5-2.5 (3) MG/3ML SOLN USE ONE VIAL IN NEBULIZER 4 TIMES DAILY 360 mL 5  . OXYGEN Inhale 2-3 L into the lungs continuous. When exerting self     . potassium chloride SA (K-DUR,KLOR-CON) 20 MEQ tablet Take 1 tablet (20 mEq total) by mouth 2 (two) times daily. (Patient taking differently: Take 20 mEq by mouth 2 (two) times a week. ) 180 tablet 1  .  predniSONE (DELTASONE) 10 MG tablet 30 mg x 3days , 27m x 3days , 151mx3 days then stop. 18 tablet 0  . sodium chloride (OCEAN) 0.65 % SOLN nasal spray Place 1 spray into both nostrils as needed (dryness).    . Tiotropium Bromide-Olodaterol (STIOLTO RESPIMAT) 2.5-2.5 MCG/ACT AERS Inhale 2 puffs into the lungs daily. 3 Inhaler 0  . tiZANidine (ZANAFLEX) 4 MG tablet Take 1 tablet (4 mg total) by mouth every 8 (eight) hours as needed for muscle spasms. 60 tablet 5  . traMADol (ULTRAM) 50 MG tablet Take 1 tablet (50 mg total) by mouth every 6 (six) hours as needed (mild pain). 10 tablet 0   No current facility-administered medications for this visit.     OBJECTIVE: Older African-American woman carrying an oxygen concentrator  Vitals:   08/30/17 1313  BP: (!) 151/62  Pulse: 92  Resp: 18  Temp: 97.8 F (36.6 C)  SpO2: 95%     Body mass index is 36.1 kg/m.   Wt Readings from Last 3 Encounters:  08/30/17 213 lb 9.6 oz (96.9 kg)  08/11/17 218 lb 11.2 oz (99.2 kg)  08/08/17 216 lb 4.8 oz (98.1 kg)      ECOG FS:2 - Symptomatic, <50% confined to bed  Sclerae unicteric, EOMs intact Oropharynx clear and moist No cervical or supraclavicular adenopathy Lungs no rales or rhonchi, O2 by nasal cannula in place Heart regular rate and rhythm Abd soft, obese, nontender, positive bowel sounds MSK no focal spinal tenderness, no upper extremity lymphedema Neuro: nonfocal, well oriented, appropriate affect Breasts: The right breast is benign.  The left breast is status post recent  lumpectomy.  The incision is practically invisible and the cosmetic result is excellent.  There is some swelling superiorly to the incision but no erythema or dehiscence.  There is no tenderness.  Both axillae are benign.   LAB RESULTS:  CMP     Component Value Date/Time   NA 139 08/10/2017 0859   K 3.5 08/10/2017 0859   CL 98 (L) 08/08/2017 0916   CO2 29 08/08/2017 0916   GLUCOSE 179 (H) 08/10/2017 0859   BUN 17 08/08/2017 0916   CREATININE 0.84 08/08/2017 0916   CREATININE 0.83 07/06/2017 0808   CALCIUM 9.6 08/08/2017 0916   PROT 6.6 07/06/2017 0808   ALBUMIN 3.4 (L) 07/06/2017 0808   AST 9 07/06/2017 0808   ALT 7 07/06/2017 0808   ALKPHOS 130 07/06/2017 0808   BILITOT 0.3 07/06/2017 0808   GFRNONAA >60 08/08/2017 0916   GFRNONAA >60 07/06/2017 0808   GFRAA >60 08/08/2017 0916   GFRAA >60 07/06/2017 0808    Lab Results  Component Value Date   TOTALPROTELP 6.7 02/28/2014   ALBUMINELP 57.4 02/28/2014   A1GS 4.9 02/28/2014   A2GS 8.5 02/28/2014   BETS 7.5 (H) 02/28/2014   BETA2SER 5.3 02/28/2014   GAMS 16.4 02/28/2014   MSPIKE 0.31 02/28/2014   SPEI SEE NOTE 02/28/2014    No results found for: KPAFRELGTCHN, LAMBDASER, KAPLAMBRATIO  Lab Results  Component Value Date   WBC 11.5 (H) 08/08/2017   NEUTROABS 12.4 (H) 07/06/2017   HGB 12.2 08/10/2017   HCT 36.0 08/10/2017   MCV 87.4 08/08/2017   PLT 254 08/08/2017    '@LASTCHEMISTRY' @  No results found for: LABCA2  No components found for: LAMLYYTK354No results for input(s): INR in the last 168 hours.  No results found for: LABCA2  No results found for: CASFK812No results found for: CAXNT700  No results found for: JSE831  No results found for: CA2729  No components found for: HGQUANT  No results found for: CEA1 / No results found for: CEA1   No results found for: AFPTUMOR  No results found for: CHROMOGRNA  No results found for: PSA1  No visits with results within 3 Day(s) from this visit.  Latest  known visit with results is:  Admission on 08/10/2017, Discharged on 08/11/2017  Component Date Value Ref Range Status  . Sodium 08/10/2017 139  135 - 145 mmol/L Final  . Potassium 08/10/2017 3.5  3.5 - 5.1 mmol/L Final  . Glucose, Bld 08/10/2017 179* 65 - 99 mg/dL Final  . HCT 08/10/2017 36.0  36.0 - 46.0 % Final  . Hemoglobin 08/10/2017 12.2  12.0 - 15.0 g/dL Final  . Glucose-Capillary 08/10/2017 209* 65 - 99 mg/dL Final  . Comment 1 08/10/2017 Notify RN   Final  . Glucose-Capillary 08/10/2017 160* 65 - 99 mg/dL Final  . Comment 1 08/10/2017 Notify RN   Final  . MRSA by PCR 08/10/2017 NEGATIVE  NEGATIVE Final   Comment:        The GeneXpert MRSA Assay (FDA approved for NASAL specimens only), is one component of a comprehensive MRSA colonization surveillance program. It is not intended to diagnose MRSA infection nor to guide or monitor treatment for MRSA infections. Performed at [facility], [place] [address] [place], [place] [address]     (this displays the last labs from the last 3 days)  Lab Results  Component Value Date   TOTALPROTELP 6.7 02/28/2014   ALBUMINELP 57.4 02/28/2014   A1GS 4.9 02/28/2014   A2GS 8.5 02/28/2014   BETS 7.5 (H) 02/28/2014   BETA2SER 5.3 02/28/2014   GAMS 16.4 02/28/2014   MSPIKE 0.31 02/28/2014   SPEI SEE NOTE 02/28/2014   (this displays SPEP labs)  No results found for: KPAFRELGTCHN, LAMBDASER, KAPLAMBRATIO (kappa/lambda light chains)  No results found for: HGBA, HGBA2QUANT, HGBFQUANT, HGBSQUAN (Hemoglobinopathy evaluation)   No results found for: LDH  No results found for: IRON, TIBC, IRONPCTSAT (Iron and TIBC)  No results found for: FERRITIN  Urinalysis    Component Value Date/Time   COLORURINE YELLOW 01/18/2017 [place] 01/18/2017 1605   LABSPEC 1.015 01/18/2017 1605   PHURINE 7.0 01/18/2017 1605   GLUCOSEU NEGATIVE 01/18/2017 1605   HGBUR NEGATIVE 01/18/2017 1605   BILIRUBINUR NEGATIVE 01/18/2017  1605   KETONESUR NEGATIVE 01/18/2017 1605   PROTEINUR 30 (A) 04/18/2011 1942   UROBILINOGEN 0.2 01/18/2017 1605   NITRITE NEGATIVE 01/18/2017 1605   LEUKOCYTESUR NEGATIVE 01/18/2017 1605     STUDIES: Mm Breast Surgical Specimen  Result Date: 08/10/2017 CLINICAL DATA:  Specimen radiograph status post left breast lumpectomy. EXAM: SPECIMEN RADIOGRAPH OF THE LEFT BREAST COMPARISON:  Previous exam(s). FINDINGS: Status post excision of the left breast. The radioactive seed and biopsy marker clip are present, completely intact, and were marked for pathology. These findings were communicated with the OR at 10:26 a.m. IMPRESSION: Specimen radiograph of the left breast. Electronically Signed   By: Ammie Ferrier M.D.   On: 08/10/2017 10:27   Mm Lt Radioactive Seed Loc Mammo Guide  Result Date: 08/09/2017 CLINICAL DATA:  Patient for preoperative localization prior to left lumpectomy. EXAM: MAMMOGRAPHIC GUIDED RADIOACTIVE SEED LOCALIZATION OF THE LEFT BREAST COMPARISON:  Previous exam(s). FINDINGS: Patient presents for radioactive seed localization prior to . I met with the patient and we discussed the procedure of seed localization including benefits  and alternatives. We discussed the high likelihood of a successful procedure. We discussed the risks of the procedure including infection, bleeding, tissue injury and further surgery. We discussed the low dose of radioactivity involved in the procedure. Informed, written consent was given. The usual time-out protocol was performed immediately prior to the procedure. Using mammographic guidance, sterile technique, 1% lidocaine and an I-125 radioactive seed, mass within the lateral left breast was localized using a lateral approach. The follow-up mammogram images confirm the seed in the expected location and were marked for Dr. Donne Hazel. Follow-up survey of the patient confirms presence of the radioactive seed. Order number of I-125 seed:  [id number]. Total  activity:  3.235 millicuries reference Date: 07/26/2017 The patient tolerated the procedure well and was released from the [facility]. She was given instructions regarding seed removal. IMPRESSION: Radioactive seed localization left breast. No apparent complications. Electronically Signed   By: Lovey Newcomer M.D.   On: 08/09/2017 16:01    ELIGIBLE FOR AVAILABLE RESEARCH PROTOCOL: no  ASSESSMENT: 72 y.o. [place] woman status post left breast upper outer quadrant biopsy 06/24/2017 for a clinical T1b N0, stage IA invasive ductal carcinoma, grade 1, estrogen and progesterone receptor positive, HER-2 not amplified, with an MIB-105%  (1) status post left lumpectomy without sentinel lymph node sampling on 08/10/2017 showed invasive ductal carcinoma grade II, spanning 1.2 cm. High grade DCIS. Anterior margin was was focally positive for invasive ductal carcinoma. All other margins clear.   (a) additional surgery for margin clearance 08/31/2017  (2) given the patient's age, the tumor size, grade and prognostic profile, will forego Oncotype and avoid chemotherapy  (3) may forgo adjuvant radiation if comfortable with anti-estrogen therapy  (4) antiestrogens to follow at the completion of local treatment  (5) osteoporosis: bone density on 02/27/2014 showed a T score of -2.9   (a) most recent bone density on 12/08/2016 showed a T score of -2.4 (osteopenia).  (6) anastrozole to start 09/14/2017   PLAN: Victoria [name] tolerated her initial surgery remarkably well.  She is scheduled for margin clearance tomorrow.  I expect that also will be successful and that she will tolerated well.  She is not a candidate for chemotherapy, and once we get the margins cleared I think she will be done with local therapy.  Accordingly she is ready to consider systemic treatment.  This will consist of anastrozole assuming she can tolerate it.  Today we discussed in detail the possible toxicities, side effects and complications  of this agent.  I think it would be best for her to wait a little postop so she will started Sep 14, 2017.  Usually takes at least several weeks to have significant side effects, if there are going to be any, so she will return to see me in mid July.  If she tolerates the anastrozole well the plan will be to do that for a total of 5 years.  If not we will find a different antiestrogen for her  She does have significant osteopenia.  We will consider either bisphosphonates or Prolia when she returns to see me in July  She knows to call for any problems that may develop before that visit.   Victoria [name], Virgie Dad, MD  08/30/17 1:47 PM Medical Oncology and Hematology [facility] [address] [place], [place] [address] Tel. [phone]    Fax. [phone]  This document serves as a record of services personally performed by Lurline Del, MD. It was created on his behalf by Sharol Given  Beverly Gust, a trained medical scribe. The creation of this record is based on the scribe's personal observations and the provider's statements to them.   I have reviewed the above documentation for accuracy and completeness, and I agree with the above.

## 2017-08-30 ENCOUNTER — Encounter (HOSPITAL_COMMUNITY): Payer: Self-pay | Admitting: *Deleted

## 2017-08-30 ENCOUNTER — Telehealth: Payer: Self-pay | Admitting: Oncology

## 2017-08-30 ENCOUNTER — Other Ambulatory Visit: Payer: Self-pay

## 2017-08-30 ENCOUNTER — Inpatient Hospital Stay: Payer: Medicare Other | Attending: Oncology | Admitting: Oncology

## 2017-08-30 VITALS — BP 151/62 | HR 92 | Temp 97.8°F | Resp 18 | Ht 64.5 in | Wt 213.6 lb

## 2017-08-30 DIAGNOSIS — Z79811 Long term (current) use of aromatase inhibitors: Secondary | ICD-10-CM | POA: Insufficient documentation

## 2017-08-30 DIAGNOSIS — Z170 Estrogen receptor positive status [ER+]: Secondary | ICD-10-CM

## 2017-08-30 DIAGNOSIS — I10 Essential (primary) hypertension: Secondary | ICD-10-CM | POA: Diagnosis not present

## 2017-08-30 DIAGNOSIS — Z780 Asymptomatic menopausal state: Secondary | ICD-10-CM | POA: Diagnosis not present

## 2017-08-30 DIAGNOSIS — C50412 Malignant neoplasm of upper-outer quadrant of left female breast: Secondary | ICD-10-CM

## 2017-08-30 DIAGNOSIS — M8580 Other specified disorders of bone density and structure, unspecified site: Secondary | ICD-10-CM

## 2017-08-30 MED ORDER — ANASTROZOLE 1 MG PO TABS: 1.0000 mg | ORAL_TABLET | Freq: Every day | ORAL | 4 refills | Status: DC

## 2017-08-30 MED ORDER — TRAMADOL HCL 50 MG PO TABS: 50.0000 mg | ORAL_TABLET | Freq: Four times a day (QID) | ORAL | 0 refills | Status: DC | PRN

## 2017-08-30 NOTE — Progress Notes (Signed)
Spoke with pt for pre-op call. Pt denies cardiac history. Pt had surgery here in March, she states nothing has changed with her allergies, medical and surgical history. I updated her med list.

## 2017-08-30 NOTE — Telephone Encounter (Signed)
Gave avs and calendar ° °

## 2017-08-31 ENCOUNTER — Ambulatory Visit (HOSPITAL_COMMUNITY)
Admission: RE | Admit: 2017-08-31 | Discharge: 2017-08-31 | Disposition: A | Discharge status: Home / Self Care | Payer: Medicare Other | Source: Ambulatory Visit | Attending: General Surgery | Admitting: General Surgery

## 2017-08-31 ENCOUNTER — Ambulatory Visit (HOSPITAL_COMMUNITY): Payer: Medicare Other | Admitting: Anesthesiology

## 2017-08-31 ENCOUNTER — Encounter (HOSPITAL_COMMUNITY)
Admission: RE | Disposition: A | Discharge status: Home / Self Care | Payer: Self-pay | Source: Ambulatory Visit | Attending: General Surgery

## 2017-08-31 ENCOUNTER — Encounter (HOSPITAL_COMMUNITY): Payer: Self-pay

## 2017-08-31 DIAGNOSIS — G4733 Obstructive sleep apnea (adult) (pediatric): Secondary | ICD-10-CM | POA: Diagnosis not present

## 2017-08-31 DIAGNOSIS — C50412 Malignant neoplasm of upper-outer quadrant of left female breast: Secondary | ICD-10-CM | POA: Diagnosis not present

## 2017-08-31 DIAGNOSIS — R7303 Prediabetes: Secondary | ICD-10-CM | POA: Diagnosis not present

## 2017-08-31 DIAGNOSIS — N6012 Diffuse cystic mastopathy of left breast: Secondary | ICD-10-CM | POA: Diagnosis not present

## 2017-08-31 DIAGNOSIS — J449 Chronic obstructive pulmonary disease, unspecified: Secondary | ICD-10-CM | POA: Insufficient documentation

## 2017-08-31 DIAGNOSIS — Z79899 Other long term (current) drug therapy: Secondary | ICD-10-CM | POA: Insufficient documentation

## 2017-08-31 DIAGNOSIS — Z170 Estrogen receptor positive status [ER+]: Secondary | ICD-10-CM | POA: Diagnosis not present

## 2017-08-31 DIAGNOSIS — M810 Age-related osteoporosis without current pathological fracture: Secondary | ICD-10-CM | POA: Diagnosis not present

## 2017-08-31 DIAGNOSIS — Z6836 Body mass index (BMI) 36.0-36.9, adult: Secondary | ICD-10-CM | POA: Insufficient documentation

## 2017-08-31 DIAGNOSIS — Z87891 Personal history of nicotine dependence: Secondary | ICD-10-CM | POA: Diagnosis not present

## 2017-08-31 DIAGNOSIS — Z888 Allergy status to other drugs, medicaments and biological substances status: Secondary | ICD-10-CM | POA: Diagnosis not present

## 2017-08-31 DIAGNOSIS — G4730 Sleep apnea, unspecified: Secondary | ICD-10-CM | POA: Insufficient documentation

## 2017-08-31 DIAGNOSIS — Z9981 Dependence on supplemental oxygen: Secondary | ICD-10-CM | POA: Insufficient documentation

## 2017-08-31 DIAGNOSIS — Z885 Allergy status to narcotic agent status: Secondary | ICD-10-CM | POA: Insufficient documentation

## 2017-08-31 DIAGNOSIS — I10 Essential (primary) hypertension: Secondary | ICD-10-CM | POA: Diagnosis not present

## 2017-08-31 DIAGNOSIS — N6092 Unspecified benign mammary dysplasia of left breast: Secondary | ICD-10-CM | POA: Insufficient documentation

## 2017-08-31 DIAGNOSIS — E785 Hyperlipidemia, unspecified: Secondary | ICD-10-CM | POA: Insufficient documentation

## 2017-08-31 DIAGNOSIS — Z8249 Family history of ischemic heart disease and other diseases of the circulatory system: Secondary | ICD-10-CM | POA: Insufficient documentation

## 2017-08-31 DIAGNOSIS — Z823 Family history of stroke: Secondary | ICD-10-CM | POA: Insufficient documentation

## 2017-08-31 DIAGNOSIS — C50912 Malignant neoplasm of unspecified site of left female breast: Secondary | ICD-10-CM | POA: Diagnosis not present

## 2017-08-31 HISTORY — PX: RE-EXCISION OF BREAST CANCER,SUPERIOR MARGINS: SHX6047

## 2017-08-31 HISTORY — PX: BREAST LUMPECTOMY: SHX2

## 2017-08-31 LAB — BASIC METABOLIC PANEL
Anion gap: 8 (ref 5–15)
BUN: 16 mg/dL (ref 6–20)
CO2: 26 mmol/L (ref 22–32)
CREATININE: 0.9 mg/dL (ref 0.44–1.00)
Calcium: 9.7 mg/dL (ref 8.9–10.3)
Chloride: 103 mmol/L (ref 101–111)
Glucose, Bld: 166 mg/dL — ABNORMAL HIGH (ref 65–99)
POTASSIUM: 4 mmol/L (ref 3.5–5.1)
SODIUM: 137 mmol/L (ref 135–145)

## 2017-08-31 LAB — CBC
HCT: 42.5 % (ref 36.0–46.0)
Hemoglobin: 13.5 g/dL (ref 12.0–15.0)
MCH: 28.5 pg (ref 26.0–34.0)
MCHC: 31.8 g/dL (ref 30.0–36.0)
MCV: 89.7 fL (ref 78.0–100.0)
PLATELETS: 241 10*3/uL (ref 150–400)
RBC: 4.74 MIL/uL (ref 3.87–5.11)
RDW: 14.2 % (ref 11.5–15.5)
WBC: 10.6 10*3/uL — AB (ref 4.0–10.5)

## 2017-08-31 LAB — HEMOGLOBIN A1C
Hgb A1c MFr Bld: 7.3 % — ABNORMAL HIGH (ref 4.8–5.6)
MEAN PLASMA GLUCOSE: 162.81 mg/dL

## 2017-08-31 LAB — GLUCOSE, CAPILLARY
GLUCOSE-CAPILLARY: 146 mg/dL — AB (ref 65–99)
Glucose-Capillary: 160 mg/dL — ABNORMAL HIGH (ref 65–99)

## 2017-08-31 SURGERY — RE-EXCISION OF BREAST CANCER,SUPERIOR MARGINS
Anesthesia: Monitor Anesthesia Care | Laterality: Left

## 2017-08-31 MED ORDER — PROPOFOL 500 MG/50ML IV EMUL
INTRAVENOUS | Status: DC | PRN
  Administered 2017-08-31: 100 ug/kg/min via INTRAVENOUS

## 2017-08-31 MED ORDER — LIDOCAINE HCL 1 % IJ SOLN
INTRAMUSCULAR | Status: DC | PRN
  Administered 2017-08-31: 16 mL

## 2017-08-31 MED ORDER — ENSURE PRE-SURGERY PO LIQD: 296.0000 mL | Freq: Once | ORAL | Status: DC

## 2017-08-31 MED ORDER — PROPOFOL 10 MG/ML IV BOLUS
INTRAVENOUS | Status: AC
  Filled 2017-08-31: qty 20

## 2017-08-31 MED ORDER — BUPIVACAINE-EPINEPHRINE 0.25% -1:200000 IJ SOLN
INTRAMUSCULAR | Status: DC | PRN
  Administered 2017-08-31: 16 mL

## 2017-08-31 MED ORDER — LIDOCAINE 2% (20 MG/ML) 5 ML SYRINGE
INTRAMUSCULAR | Status: DC | PRN
  Administered 2017-08-31: 60 mg via INTRAVENOUS

## 2017-08-31 MED ORDER — FENTANYL CITRATE (PF) 100 MCG/2ML IJ SOLN
INTRAMUSCULAR | Status: DC | PRN
  Administered 2017-08-31 (×2): 25 ug via INTRAVENOUS

## 2017-08-31 MED ORDER — LACTATED RINGERS IV SOLN
INTRAVENOUS | Status: DC | PRN
  Administered 2017-08-31: 08:00:00 via INTRAVENOUS

## 2017-08-31 MED ORDER — CEFAZOLIN SODIUM-DEXTROSE 2-4 GM/100ML-% IV SOLN
2.0000 g | INTRAVENOUS | Status: AC
  Administered 2017-08-31: 2 g via INTRAVENOUS
  Filled 2017-08-31: qty 100

## 2017-08-31 MED ORDER — MEPERIDINE HCL 50 MG/ML IJ SOLN: 6.2500 mg | INTRAMUSCULAR | Status: DC | PRN

## 2017-08-31 MED ORDER — CHLORHEXIDINE GLUCONATE CLOTH 2 % EX PADS: 6.0000 | MEDICATED_PAD | Freq: Once | CUTANEOUS | Status: DC

## 2017-08-31 MED ORDER — MIDAZOLAM HCL 2 MG/2ML IJ SOLN
INTRAMUSCULAR | Status: AC
  Filled 2017-08-31: qty 2

## 2017-08-31 MED ORDER — ACETAMINOPHEN 10 MG/ML IV SOLN: 1000.0000 mg | Freq: Once | INTRAVENOUS | Status: DC | PRN

## 2017-08-31 MED ORDER — DEXAMETHASONE SODIUM PHOSPHATE 10 MG/ML IJ SOLN
INTRAMUSCULAR | Status: DC | PRN
  Administered 2017-08-31: 10 mg via INTRAVENOUS

## 2017-08-31 MED ORDER — ONDANSETRON HCL 4 MG/2ML IJ SOLN
INTRAMUSCULAR | Status: DC | PRN
  Administered 2017-08-31: 4 mg via INTRAVENOUS

## 2017-08-31 MED ORDER — HYDROMORPHONE HCL 2 MG/ML IJ SOLN: 0.2500 mg | INTRAMUSCULAR | Status: DC | PRN

## 2017-08-31 MED ORDER — FENTANYL CITRATE (PF) 250 MCG/5ML IJ SOLN
INTRAMUSCULAR | Status: AC
  Filled 2017-08-31: qty 5

## 2017-08-31 MED ORDER — ACETAMINOPHEN 500 MG PO TABS
1000.0000 mg | ORAL_TABLET | ORAL | Status: AC
  Administered 2017-08-31: 1000 mg via ORAL
  Filled 2017-08-31: qty 2

## 2017-08-31 MED ORDER — PROMETHAZINE HCL 25 MG/ML IJ SOLN
6.2500 mg | INTRAMUSCULAR | Status: DC | PRN
Start: 2017-08-31 — End: 2017-08-31

## 2017-08-31 MED ORDER — LIDOCAINE HCL 1 % IJ SOLN
INTRAMUSCULAR | Status: AC
  Filled 2017-08-31: qty 20

## 2017-08-31 MED ORDER — LIDOCAINE-EPINEPHRINE 1 %-1:100000 IJ SOLN
INTRAMUSCULAR | Status: AC
  Filled 2017-08-31: qty 1

## 2017-08-31 MED ORDER — BUPIVACAINE-EPINEPHRINE (PF) 0.25% -1:200000 IJ SOLN
INTRAMUSCULAR | Status: AC
  Filled 2017-08-31: qty 30

## 2017-08-31 MED ORDER — DEXAMETHASONE SODIUM PHOSPHATE 10 MG/ML IJ SOLN
INTRAMUSCULAR | Status: AC
  Filled 2017-08-31: qty 1

## 2017-08-31 MED ORDER — ONDANSETRON HCL 4 MG/2ML IJ SOLN
INTRAMUSCULAR | Status: AC
  Filled 2017-08-31: qty 2

## 2017-08-31 MED ORDER — HYDROCODONE-ACETAMINOPHEN 7.5-325 MG PO TABS: 1.0000 | ORAL_TABLET | Freq: Once | ORAL | Status: DC | PRN

## 2017-08-31 MED ORDER — LIDOCAINE 2% (20 MG/ML) 5 ML SYRINGE
INTRAMUSCULAR | Status: AC
  Filled 2017-08-31: qty 5

## 2017-08-31 SURGICAL SUPPLY — 47 items
APPLIER CLIP 9.375 MED OPEN (MISCELLANEOUS)
BINDER BREAST LRG (GAUZE/BANDAGES/DRESSINGS) IMPLANT
BINDER BREAST XLRG (GAUZE/BANDAGES/DRESSINGS) ×2 IMPLANT
BLADE CLIPPER SURG (BLADE) IMPLANT
CANISTER SUCT 3000ML PPV (MISCELLANEOUS) IMPLANT
CHLORAPREP W/TINT 26ML (MISCELLANEOUS) ×2 IMPLANT
CLIP APPLIE 9.375 MED OPEN (MISCELLANEOUS) IMPLANT
CONT SPEC 4OZ CLIKSEAL STRL BL (MISCELLANEOUS) IMPLANT
COVER SURGICAL LIGHT HANDLE (MISCELLANEOUS) ×2 IMPLANT
DECANTER SPIKE VIAL GLASS SM (MISCELLANEOUS) ×2 IMPLANT
DERMABOND ADVANCED (GAUZE/BANDAGES/DRESSINGS) ×1
DERMABOND ADVANCED .7 DNX12 (GAUZE/BANDAGES/DRESSINGS) ×1 IMPLANT
DEVICE DUBIN SPECIMEN MAMMOGRA (MISCELLANEOUS) IMPLANT
DRAPE LAPAROTOMY 100X72 PEDS (DRAPES) ×2 IMPLANT
DRSG OPSITE 4X5.5 SM (GAUZE/BANDAGES/DRESSINGS) ×2 IMPLANT
ELECT CAUTERY BLADE 6.4 (BLADE) ×2 IMPLANT
ELECT REM PT RETURN 9FT ADLT (ELECTROSURGICAL) ×2
ELECTRODE REM PT RTRN 9FT ADLT (ELECTROSURGICAL) ×1 IMPLANT
GAUZE SPONGE 4X4 12PLY STRL (GAUZE/BANDAGES/DRESSINGS) ×2 IMPLANT
GLOVE BIO SURGEON STRL SZ7 (GLOVE) ×2 IMPLANT
GLOVE BIOGEL PI IND STRL 7.5 (GLOVE) ×1 IMPLANT
GLOVE BIOGEL PI INDICATOR 7.5 (GLOVE) ×1
GOWN STRL REUS W/ TWL LRG LVL3 (GOWN DISPOSABLE) ×2 IMPLANT
GOWN STRL REUS W/TWL LRG LVL3 (GOWN DISPOSABLE) ×2
KIT BASIN OR (CUSTOM PROCEDURE TRAY) ×2 IMPLANT
KIT MARKER MARGIN INK (KITS) IMPLANT
KIT TURNOVER KIT B (KITS) ×2 IMPLANT
NEEDLE HYPO 25GX1X1/2 BEV (NEEDLE) ×2 IMPLANT
NS IRRIG 1000ML POUR BTL (IV SOLUTION) ×2 IMPLANT
PACK SURGICAL SETUP 50X90 (CUSTOM PROCEDURE TRAY) ×2 IMPLANT
PAD ARMBOARD 7.5X6 YLW CONV (MISCELLANEOUS) ×4 IMPLANT
PENCIL BUTTON HOLSTER BLD 10FT (ELECTRODE) ×2 IMPLANT
SPONGE LAP 4X18 X RAY DECT (DISPOSABLE) ×2 IMPLANT
STRIP CLOSURE SKIN 1/2X4 (GAUZE/BANDAGES/DRESSINGS) ×2 IMPLANT
SUT MNCRL AB 4-0 PS2 18 (SUTURE) ×2 IMPLANT
SUT MON AB 5-0 P3 18 (SUTURE) ×2 IMPLANT
SUT SILK 2 0 SH (SUTURE) IMPLANT
SUT VIC AB 2-0 SH 27 (SUTURE) ×1
SUT VIC AB 2-0 SH 27X BRD (SUTURE) ×1 IMPLANT
SUT VIC AB 3-0 SH 27 (SUTURE) ×1
SUT VIC AB 3-0 SH 27XBRD (SUTURE) ×1 IMPLANT
SYR BULB 3OZ (MISCELLANEOUS) ×2 IMPLANT
SYR CONTROL 10ML LL (SYRINGE) ×2 IMPLANT
TOWEL OR 17X24 6PK STRL BLUE (TOWEL DISPOSABLE) ×2 IMPLANT
TOWEL OR 17X26 10 PK STRL BLUE (TOWEL DISPOSABLE) ×2 IMPLANT
TUBE CONNECTING 12X1/4 (SUCTIONS) IMPLANT
YANKAUER SUCT BULB TIP NO VENT (SUCTIONS) IMPLANT

## 2017-08-31 NOTE — Discharge Instructions (Signed)
[facility] °Office Phone Number [phone] °POST OP INSTRUCTIONS ° °Always review your discharge instruction sheet given to you by the facility where your surgery was performed. ° °IF YOU HAVE DISABILITY OR FAMILY LEAVE FORMS, YOU MUST BRING THEM TO THE OFFICE FOR PROCESSING.  DO NOT GIVE THEM TO YOUR DOCTOR. ° °1. A prescription for pain medication may be given to you upon discharge.  Take your pain medication as prescribed, if needed.  If narcotic pain medicine is not needed, then you may take acetaminophen (Tylenol), naprosyn (Alleve) or ibuprofen (Advil) as needed. °2. Take your usually prescribed medications unless otherwise directed °3. If you need a refill on your pain medication, please contact your pharmacy.  They will contact our office to request authorization.  Prescriptions will not be filled after 5pm or on week-ends. °4. You should eat very light the first 24 hours after surgery, such as soup, crackers, pudding, etc.  Resume your normal diet the day after surgery. °5. Most patients will experience some swelling and bruising in the breast.  Ice packs and a good support bra will help.  Wear the breast binder provided or a sports bra for 72 hours day and night.  After that wear a sports bra during the day until you return to the office. Swelling and bruising can take several days to resolve.  °6. It is common to experience some constipation if taking pain medication after surgery.  Increasing fluid intake and taking a stool softener will usually help or prevent this problem from occurring.  A mild laxative (Milk of Magnesia or Miralax) should be taken according to package directions if there are no bowel movements after 48 hours. °7. Unless discharge instructions indicate otherwise, you may remove your bandages 48 hours after surgery and you may shower at that time.  You may have steri-strips (small skin tapes) in place directly over the incision.  These strips should be left on the  skin for 7-10 days and will come off on their own.  If your surgeon used skin glue on the incision, you may shower in 24 hours.  The glue will flake off over the next 2-3 weeks.  Any sutures or staples will be removed at the office during your follow-up visit. °8. ACTIVITIES:  You may resume regular daily activities (gradually increasing) beginning the next day.  Wearing a good support bra or sports bra minimizes pain and swelling.  You may have sexual intercourse when it is comfortable. °a. You may drive when you no longer are taking prescription pain medication, you can comfortably wear a seatbelt, and you can safely maneuver your car and apply brakes. °b. RETURN TO WORK:  ______________________________________________________________________________________ °9. You should see your doctor in the office for a follow-up appointment approximately two weeks after your surgery.  Your doctor’s nurse will typically make your follow-up appointment when she calls you with your pathology report.  Expect your pathology report 3-4 business days after your surgery.  You may call to check if you do not hear from us after three days. °10. OTHER INSTRUCTIONS: _______________________________________________________________________________________________ _____________________________________________________________________________________________________________________________________ °_____________________________________________________________________________________________________________________________________ °_____________________________________________________________________________________________________________________________________ ° °WHEN TO CALL DR Slate Debroux: °1. Fever over 101.0 °2. Nausea and/or vomiting. °3. Extreme swelling or bruising. °4. Continued bleeding from incision. °5. Increased pain, redness, or drainage from the incision. ° °The clinic staff is available to answer your questions during regular  business hours.  Please don’t hesitate to call and ask to speak to one of the nurses for clinical concerns.  If you   have a medical emergency, go to the nearest emergency room or call 911.  A surgeon from [facility] is always on call at the hospital. ° °For further questions, please visit [email or web address] ° °

## 2017-08-31 NOTE — Anesthesia Procedure Notes (Signed)
Procedure Name: MAC Date/Time: 08/31/2017 8:27 AM Performed by: Kyung Rudd, CRNA Pre-anesthesia Checklist: Patient identified, Emergency Drugs available, Suction available and Patient being monitored Patient Re-evaluated:Patient Re-evaluated prior to induction Oxygen Delivery Method: Simple face mask Induction Type: IV induction Placement Confirmation: positive ETCO2 Dental Injury: Teeth and Oropharynx as per pre-operative assessment

## 2017-08-31 NOTE — Interval H&P Note (Signed)
History and Physical Interval Note:  08/31/2017 8:03 AM  Victoria [name]  has presented today for surgery, with the diagnosis of left breast cancer  The various methods of treatment have been discussed with the patient and family. After consideration of risks, benefits and other options for treatment, the patient has consented to  Procedure(s): RE-EXCISION OF LEFT  BREAST [place] (Left) as a surgical intervention .  The patient's history has been reviewed, patient examined, no change in status, stable for surgery.  I have reviewed the patient's chart and labs.  Questions were answered to the patient's satisfaction.     Rolm Bookbinder

## 2017-08-31 NOTE — Anesthesia Postprocedure Evaluation (Signed)
Anesthesia Post Note  Patient: Victoria [name]  Procedure(s) Performed: RE-EXCISION OF LEFT  BREAST MARG[place] ERAS PATHWAY (Left )     Patient location during evaluation: PACU Anesthesia Type: MAC Level of consciousness: awake and alert Pain management: pain level controlled Vital Signs Assessment: post-procedure vital signs reviewed and stable Respiratory status: spontaneous breathing, nonlabored ventilation, respiratory function stable and patient connected to nasal cannula oxygen Cardiovascular status: stable and blood pressure returned to baseline Postop Assessment: no apparent nausea or vomiting Anesthetic complications: no    Last Vitals:  Vitals:   08/31/17 0945 08/31/17 0952  BP: (!) 166/70   Pulse: 81 77  Resp: 20 (!) 21  Temp:  (!) 36.4 C  SpO2: 98% 96%    Last Pain:  Vitals:   08/31/17 0945  TempSrc:   PainSc: 0-No pain                 Barnet Glasgow

## 2017-08-31 NOTE — Anesthesia Preprocedure Evaluation (Addendum)
Anesthesia Evaluation  Patient identified by MRN, date of birth, ID band Patient awake    Reviewed: Allergy & Precautions, NPO status , [name] Chart, lab work & pertinent test results  Airway Mallampati: II  TM Distance: >3 FB Neck ROM: Full    Dental no notable dental hx. (+) Teeth Intact, Dental Advisory Given   Pulmonary shortness of breath, sleep apnea , COPD,  oxygen dependent, former smoker,    Pulmonary exam normal breath sounds clear to auscultation       Cardiovascular hypertension, + CAD and + Peripheral Vascular Disease  Normal cardiovascular exam Rhythm:Regular Rate:Normal  hypertension, Pt. on medications (-) angina+ Peripheral Vascular Disease (small AAA)   Rhythm:Regular Rate:Normal  '15 Stress: Normal stress nuclear study. LV Wall Motion:  NL, EF 70% '16 ECHO: EF 65-70%, valves OK    Neuro/Psych  Headaches,    GI/Hepatic Neg liver ROS,   Endo/Other  diabetes, Type 2Morbid obesity  Renal/GU negative Renal ROS     Musculoskeletal   Abdominal   Peds  Hematology  (+) anemia ,   Anesthesia Other Findings   Reproductive/Obstetrics                                                          Anesthesia Evaluation  Patient identified by MRN, date of birth, ID band Patient awake    Reviewed: Allergy & Precautions, NPO status , [name] Chart, lab work & pertinent test results  History of Anesthesia Complications Negative for: history of anesthetic complications  Airway Mallampati: I  TM Distance: >3 FB Neck ROM: Full    Dental  (+) Dental Advisory Given   Pulmonary sleep apnea, Continuous Positive Airway Pressure Ventilation and Oxygen sleep apnea , COPD (steroids),  COPD inhaler and oxygen dependent, former smoker (quit 2012),    breath sounds clear to auscultation + decreased breath sounds      Cardiovascular hypertension, Pt. on medications (-) angina+  Peripheral Vascular Disease (small AAA)   Rhythm:Regular Rate:Normal  '15 Stress: Normal stress nuclear study. LV Wall Motion:  NL, EF 70% '16 ECHO: EF 65-70%, valves OK   Neuro/Psych negative neurological ROS     GI/Hepatic negative GI ROS, Neg liver ROS,   Endo/Other  Morbid obesity  Renal/GU negative Renal ROS     Musculoskeletal  (+) Arthritis ,   Abdominal (+) + obese,   Peds  Hematology negative hematology ROS (+)   Anesthesia Other Findings   Reproductive/Obstetrics                            Anesthesia Physical Anesthesia Plan  ASA: III  Anesthesia Plan: General   Post-op Pain Management:    Induction: Intravenous  PONV Risk Score and Plan: 3 and Ondansetron, Dexamethasone and Treatment may vary due to age or medical condition  Airway Management Planned: LMA  Additional Equipment:   Intra-op Plan:   Post-operative Plan:   Informed Consent: I have reviewed the patients History and Physical, chart, labs and discussed the procedure including the risks, benefits and alternatives for the proposed anesthesia with the patient or authorized representative who has indicated his/her understanding and acceptance.   Dental advisory given  Plan Discussed with: CRNA and Surgeon  Anesthesia Plan Comments: (Plan routine monitors, GA- LMA  OK)        Anesthesia Quick Evaluation                                   Anesthesia Evaluation  Patient identified by MRN, date of birth, ID band Patient awake    Reviewed: Allergy & Precautions, NPO status , [name] Chart, lab work & pertinent test results  History of Anesthesia Complications Negative for: history of anesthetic complications  Airway Mallampati: I  TM Distance: >3 FB Neck ROM: Full    Dental  (+) Dental Advisory Given   Pulmonary sleep apnea, Continuous Positive Airway Pressure Ventilation and Oxygen sleep apnea , COPD (steroids),  COPD inhaler and oxygen  dependent, former smoker (quit 2012),    breath sounds clear to auscultation + decreased breath sounds      Cardiovascular hypertension, Pt. on medications (-) angina+ Peripheral Vascular Disease (small AAA)   Rhythm:Regular Rate:Normal  '15 Stress: Normal stress nuclear study. LV Wall Motion:  NL, EF 70% '16 ECHO: EF 65-70%, valves OK   Neuro/Psych negative neurological ROS     GI/Hepatic negative GI ROS, Neg liver ROS,   Endo/Other  Morbid obesity  Renal/GU negative Renal ROS     Musculoskeletal  (+) Arthritis ,   Abdominal (+) + obese,   Peds  Hematology negative hematology ROS (+)   Anesthesia Other Findings   Reproductive/Obstetrics                            Anesthesia Physical Anesthesia Plan  ASA: III  Anesthesia Plan: General   Post-op Pain Management:    Induction: Intravenous  PONV Risk Score and Plan: 3 and Ondansetron, Dexamethasone and Treatment may vary due to age or medical condition  Airway Management Planned: LMA  Additional Equipment:   Intra-op Plan:   Post-operative Plan:   Informed Consent: I have reviewed the patients History and Physical, chart, labs and discussed the procedure including the risks, benefits and alternatives for the proposed anesthesia with the patient or authorized representative who has indicated his/her understanding and acceptance.   Dental advisory given  Plan Discussed with: CRNA and Surgeon  Anesthesia Plan Comments: (Plan routine monitors, GA- LMA OK)        Anesthesia Quick Evaluation Airway Mallampati: I  TM Distance: >3 FB Neck ROM: Full    Dental  (+) Dental Advisory Given    Anesthesia Physical Anesthesia Plan  ASA: IV  Anesthesia Plan: MAC   Post-op Pain Management:    Induction: Intravenous  PONV Risk Score and Plan:   Airway Management Planned: Mask and Natural Airway  Additional Equipment:   Intra-op Plan:   Post-operative Plan:    Informed Consent: I have reviewed the patients History and Physical, chart, labs and discussed the procedure including the risks, benefits and alternatives for the proposed anesthesia with the patient or authorized representative who has indicated his/her understanding and acceptance.     Plan Discussed with:   Anesthesia Plan Comments:         Anesthesia Quick Evaluation

## 2017-08-31 NOTE — Transfer of Care (Signed)
Immediate Anesthesia Transfer of Care Note  Patient: Victoria [name]  Procedure(s) Performed: RE-EXCISION OF LEFT  BREAST [place] ERAS PATHWAY (Left )  Patient Location: PACU  Anesthesia Type:MAC  Level of Consciousness: awake, alert  and oriented  Airway & Oxygen Therapy: Patient Spontanous Breathing and Patient connected to nasal cannula oxygen  Post-op Assessment: Report given to RN, Post -op Vital signs reviewed and stable and Patient moving all extremities X 4  Post vital signs: Reviewed and stable  Last Vitals:  Vitals Value Taken Time  BP 159/76 08/31/2017  9:18 AM  Temp 36.5 C 08/31/2017  9:16 AM  Pulse 83 08/31/2017  9:22 AM  Resp 17 08/31/2017  9:22 AM  SpO2 100 % 08/31/2017  9:22 AM  Vitals shown include unvalidated device data.  Last Pain:  Vitals:   08/31/17 0746  TempSrc:   PainSc: 2       Patients Stated Pain Goal: 0 (55/37/48 2707)  Complications: No apparent anesthesia complications

## 2017-08-31 NOTE — Op Note (Signed)
Preoperative diagnosis: Left breast cancer, clinical stage 1 s/p lumpectomy with positive or close margins Postoperative diagnosis: same as above Procedure:Leftbreast re-excision anterior and medial margins Surgeon: Dr Serita Grammes FSE:[id number] Anes: local mac Specimens: 1. Medial margin marked short superior, long lateral, double deep 2. Anterior retroareolar margin marked short superior , long lateral, double deep 3. Lateral anterior margin marked short superior, long lateral, double deep Complications none Drains none Sponge count correct Dispo to pacu stable  Indications: This is [id number] with new leftbreast cancerwith multiple comorbidities including oxygen dependent copd.We discussed options and have elected to proceed with lumpectomy. In [place] decided she did not need sn biopsy. She had radioactive seed placed prior to beginning and the mammograms were available for review.she underwent lumpectomy with a positive anterior margin as well as a small focus of dcis on the additional medial margin I had removed. I discussed going back to or under local mac for excision of both of these margins.   Procedure: After informed consent was obtained the patient was taken to the operating room. She was given antibiotics. Sequential compression devices were on her legs. She was then placed under MAC. Then she was prepped and draped in the standard sterile surgical fashion. Surgical timeout was then performed.  I infiltrated 1% lidocaine and .25% marcaine with epinephrine throughout the area. I then reentered the old incision and released the cavity. There was a small amount of seroma fluid. I then removed the medial margin and marked as above.  I then removed the anterior margin in two parts marked as above. I then obtained hemostasis. This was marked as above.I closed with with 2-0 vicryl to approximate breast tissue. The skin was closed with 3-0 vicryl and 5-0 monocryl. Glue and  steristrips were placed.

## 2017-09-01 ENCOUNTER — Encounter (HOSPITAL_COMMUNITY): Payer: Self-pay | Admitting: General Surgery

## 2017-09-01 ENCOUNTER — Ambulatory Visit: Payer: Medicare Other | Admitting: Internal Medicine

## 2017-09-01 VITALS — BP 144/68 | HR 95 | Ht 64.5 in | Wt 218.8 lb

## 2017-09-01 DIAGNOSIS — R7989 Other specified abnormal findings of blood chemistry: Secondary | ICD-10-CM

## 2017-09-01 DIAGNOSIS — E042 Nontoxic multinodular goiter: Secondary | ICD-10-CM

## 2017-09-01 LAB — TSH: TSH: 0.14 u[IU]/mL — ABNORMAL LOW (ref 0.35–4.50)

## 2017-09-01 LAB — T3, FREE: T3 FREE: 3.2 pg/mL (ref 2.3–4.2)

## 2017-09-01 LAB — T4, FREE: Free T4: 0.71 ng/dL (ref 0.60–1.60)

## 2017-09-01 NOTE — Patient Instructions (Signed)
Please stop at the lab.  We will let you know if we need a thyroid uptake and scan.  Please come back for a follow-up appointment in 4 months.   Thyroid Nodule A thyroid nodule is an isolatedgrowth of thyroid cells that forms a lump in your thyroid gland. The thyroid gland is a butterfly-shaped gland. It is found in the lower front of your neck. This gland sends chemical messengers (hormones) through your blood to all parts of your body. These hormones are important in regulating your body temperature and helping your body to use energy. Thyroid nodules are common. Most are not cancerous (are benign). You may have one nodule or several nodules. Different types of thyroid nodules include:  Nodules that grow and fill with fluid (thyroid cysts).  Nodules that produce too much thyroid hormone (hot nodules or hyperthyroid).  Nodules that produce no thyroid hormone (cold nodules or hypothyroid).  Nodules that form from cancer cells (thyroid cancers).  What are the causes? Usually, the cause of this condition is not known. What increases the risk? Factors that make this condition more likely to develop include:  Increasing age. Thyroid nodules become more common in people who are older than 74 years of age.  Gender. ? Benign thyroid nodules are more common in women. ? Cancerous (malignant) thyroid nodules are more common in men.  A family history that includes: ? Thyroid nodules. ? Pheochromocytoma. ? Thyroid carcinoma. ? Hyperparathyroidism.  Certain kinds of thyroid diseases, such as Hashimoto thyroiditis.  Lack of iodine.  A history of head and neck radiation, such as from X-rays.  What are the signs or symptoms? It is common for this condition to cause no symptoms. If you have symptoms, they may include:  A lump in your lower neck.  Feeling a lump or tickle in your throat.  Pain in your neck, jaw, or ear.  Having trouble swallowing.  Hot nodules may cause symptoms  that include:  Weight loss.  Warm, flushed skin.  Feeling hot.  Feeling nervous.  A racing heartbeat.  Cold nodules may cause symptoms that include:  Weight gain.  Dry skin.  Brittle hair. This may also occur with hair loss.  Feeling cold.  Fatigue.  Thyroid cancer nodules may cause symptoms that include:  Hard nodules that feel stuck to the thyroid gland.  Hoarseness.  Lumps in the glands near your thyroid (lymph nodes).  How is this diagnosed? A thyroid nodule may be felt by your health care provider during a physical exam. This condition may also be diagnosed based on your symptoms. You may also have tests, including:  An ultrasound. This may be done to confirm the diagnosis.  A biopsy. This involves taking a sample from the nodule and looking at it under a microscope to see if the nodule is benign.  Blood tests to make sure that your thyroid is working properly.  Imaging tests such as MRI or CT scan may be done if: ? Your nodule is large. ? Your nodule is blocking your airway. ? Cancer is suspected.  How is this treated? Treatment depends on the cause and size of your nodule or nodules. If the nodule is benign, treatment may not be necessary. Your health care provider may monitor the nodule to see if it goes away without treatment. If the nodule continues to grow, is cancerous, or does not go away:  It may need to be drained with a needle.  It may need to be removed with surgery.  If you have surgery, part or all of your thyroid gland may need to be removed as well. Follow these instructions at home:  Pay attention to any changes in your nodule.  Take over-the-counter and prescription medicines only as told by your health care provider.  Keep all follow-up visits as told by your health care provider. This is important. Contact a health care provider if:  Your voice changes.  You have trouble swallowing.  You have pain in your neck, ear, or jaw  that is getting worse.  Your nodule gets bigger.  Your nodule starts to make it harder for you to breathe. Get help right away if:  You have a sudden fever.  You feel very weak.  Your muscles look like they are shrinking (muscle wasting).  You have mood swings.  You feel very restless.  You feel confused.  You are seeing or hearing things that other people do not see or hear (having hallucinations).  You feel suddenly nauseous or throw up.  You suddenly have diarrhea.  You have chest pain.  There is a loss of consciousness. This information is not intended to replace advice given to you by your health care provider. Make sure you discuss any questions you have with your health care provider. Document Released: 03/26/2004 Document Revised: 01/04/2016 Document Reviewed: 08/14/2014 Elsevier Interactive Patient Education  2018 [employer].

## 2017-09-01 NOTE — Progress Notes (Addendum)
Patient ID: Victoria [name], female   DOB: 1945-08-18, 72 y.o.   MRN: [id number]    HPI  Victoria [name] is a 72 y.o.-year-old female, referred by Dr. Harlow Asa for evaluation for thyrotoxicosis and MNG.  She was dx'ed with thyroid nodules ~2010-2011 - by Dr. Radene Knee.  She was referred to Dr. Harlow Asa for thyroidectomy, but she refused as she was concerned about vocal cord damage from the Sx.  Dr. Harlow Asa checked her TFTs: TSH suppressed. He referred her to endocrinology for further management and possible nonsurgical tx.   I reviewed pt's thyroid tests: 08/17/2017: TSH 0.064 Lab Results  Component Value Date   TSH 0.48 01/13/2017   TSH 0.47 10/14/2015   TSH 1.20 05/13/2015   TSH 0.69 02/11/2014   FREET4 0.83 10/14/2015   Antithyroid antibodies: No results found for: TSI   Thyroid U/S (07/29/2017):  COMPARISON:  03/23/2016  FINDINGS: Parenchymal Echotexture: Moderately heterogenous Isthmus: 1.4 cm, previously 0.8 cm Right lobe: 4.2 x 3.3 x 2.7 cm, previously 6.5 x 3.0 x 3.6 cm Left lobe: 5.2 x 2.9 x 2.9 cm, previously 6.1 x 2.9 x 2.9 cm _________________________________________________________  Estimated total number of nodules >/= 1 cm: 6-10 _________________________________________________________  Nodule # 2: Prior biopsy: No Location: Isthmus; Mid Maximum size: 1.6 cm; Other 2 dimensions: 1.5 x 0.9 cm, previously,1.4 x 1.4 x 0.9 cm Composition: solid/almost completely solid (2) Echogenicity: hypoechoic (2) **Given size (>/= 1.5 cm) and appearance, fine needle aspiration of this moderately suspicious nodule should be considered based on TI-RADS criteria. ___________________________________________________  Nodule # 3: Prior biopsy: No Location: Right; Superior Maximum size: 1.1 cm; Other 2 dimensions: 1.0 x 1.0 cm, previously, 1.2 x 1.1 cm Composition: solid/almost completely solid (2) Echogenicity: hypoechoic (2) *Given size (>/= 1 - 1.4 cm) and appearance, a  follow-up ultrasound in 1 year should be considered based on TI-RADS criteria. _________________________________________________________  Nodule # 4: Location: Right; Superior Maximum size: 1.5 cm; Other 2 dimensions: 1.0 x 1.0 cm Composition: solid/almost completely solid (2) Echogenicity: hypoechoic (2) **Given size (>/= 1.5 cm) and appearance,fine needle aspiration of this moderately suspicious nodule should be considered based on TI-RADS criteria. ________________________________________________________  Nodule # 5: Location: Right; Mid Maximum size: 1.8 cm; Other 2 dimensions: 1.4 x 1.4 cm Composition: solid/almost completely solid (2) Echogenicity: hypoechoic (2) S**Given size (>/= 1.5 cm) and appearance, fine needle aspiration of this moderately suspicious nodule should be considered based on TI-RADS criteria. ____________________________________________________  Nodule # 7: Location: Right; Inferior Maximum size: 1.6 cm; Other 2 dimensions: 1.6 x 0.8 cm Composition: solid/almost completely solid (2) Echogenicity: hypoechoic (2) **Given size (>/= 1.5 cm) and appearance,fine needle aspiration of this moderately suspicious nodule should be considered based on TI-RADS criteria. ___________________________________________________  Nodule # 8: Location: Left; Superior Maximum size: 1.7 cm; Other 2 dimensions: 1.2 x 1.2 cm Composition: solid/almost completely solid (2) Echogenicity: isoechoic (1) *Given size (>/= 1.5 - 2.4 cm) and appearance,  a follow-up ultrasound in 1 year should be considered based on TI-RADS criteria. _________________________________________________________  Nodule # 9: Location: Left; Mid Maximum size: 1.6 cm; Other 2 dimensions: 1.5 x 0.8 cm Composition: solid/almost completely solid (2) Echogenicity: isoechoic (1) *Given size (>/= 1.5 - 2.4 cm) and appearance,  a follow-up ultrasound in 1 year should be considered based on TI-RADS  criteria. _________________________________________________________  Nodule # 11: Location: Left; Inferior Maximum size: 1.4 cm; Other 2 dimensions: 1.2 x 1.0 cm Composition: solid/almost completely solid (2) Echogenicity: hypoechoic (2) *Given size (>/= 1 -  1.4 cm) and appearance, a follow-up ultrasound in 1 year should be considered based on TI-RADS criteria. ______________________________________________________  Right lower pole nodule 6 measures 1.9 x 1.6 x 1.6 cm and previously measured 2.0 x 1.7 x 1.1 cm. Biopsy was performed previously.  Left mid nodule measures 2.4 x 2.2 x 1.6 cm and previously measured 1.9 x 1.5 x 1.2 cm. Biopsy was performed previously.  Other nodules do not meet criteria for biopsy nor follow-up.  Bilateral dominant nodules are not significantly changed and were previously biopsied.  Final aspiration biopsy is recommended for nodules 5 and 7.    She is scheduled for Bx's of the 4 starred nodules above on 09/27/2017.  Pt denies feeling nodules in neck, hoarseness, dysphagia/odynophagia, SOB with lying down. She can have choking if tries to drink too fast or when taking potassium pills.  She denies: - fatigue - excessive sweating/heat intolerance - tremors - anxiety - palpitations - hyperdefecation - weight loss - hair loss  Pt does not have a FH of thyroid ds. No FH of thyroid cancer. No h/o radiation tx to head or neck.  No seaweed or kelp, no recent contrast studies. No steroid use. No herbal supplements. No recent Biotin use.  Pt. also has a history of BrCA - recent Sx 07/2017. No ChTx and RxTx needed. Also has COPD - on occas. Prednisone.  ROS: Constitutional: + weight gain (Prednisone), + fatigue, no subjective hyperthermia/hypothermia Eyes: no blurry vision, no xerophthalmia ENT: no sore throat, no nodules palpated in throat, no dysphagia/odynophagia, no hoarseness Cardiovascular: no CP/+ SOB/no palpitations/leg  swelling Respiratory: + cough/+ SOB/+ wheezing Gastrointestinal: no N/V/D/C Musculoskeletal: no muscle/joint aches Skin: no rashes Neurological: no tremors/numbness/tingling/dizziness Psychiatric: no depression/anxiety  Past Medical History:  Diagnosis Date  . Abdominal aneurysm ([place])    Dr. Oneida Alar follows lLOV 2 ''17 per pt "around 2 cm"  . Anemia    as a child  . Arthritis    left ankle, right knee, right SI joint, wrists, lower back  . Breast cancer in female [facility])    Left  . COPD (chronic obstructive pulmonary disease) ([place])    ephysema-Dr. Chase Caller  . Dyspnea   . Headache    as a child would have terrible headaches during season changes  . 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 Hyperlipidemia   . Hypertension   . Multiple thyroid nodules   . Murmur, cardiac 1950  . Osteoporosis   . Pneumonia   . Pre-diabetes   . Requires continuous at home supplemental oxygen    2 L 24/7  . Sebaceous cyst    hairline sebaceous cyst left posterior neck to be excised 04-13-16 by Dr. Harlow Asa in [place] [facility]  . Sleep apnea    cpap used sometimes, uses oxygen concentrator 2 l/m nasally bedtime  . Varicella as child   Past Surgical History:  Procedure Laterality Date  . APPENDECTOMY     2008  . BREAST LUMPECTOMY WITH RADIOACTIVE SEED LOCALIZATION Left 08/10/2017   Procedure: BREAST LUMPECTOMY WITH RADIOACTIVE SEED LOCALIZATION;  Surgeon: Rolm Bookbinder, MD;  Location: [place];  Service: General;  Laterality: Left;  . [place]  . COLONOSCOPY    . COLONOSCOPY WITH PROPOFOL N/A 04/16/2016   Procedure: COLONOSCOPY WITH PROPOFOL;  Surgeon: Carol Ada, MD;  Location: [facility];  Service: Endoscopy;  Laterality: N/A;  . CYST REMOVAL NECK Left 04/13/2016   Procedure: EXCISION OF SEBACEOUS CYST LEFT  POSTERIOR NECK;  Surgeon: Armandina Gemma, MD;  Location: [facility];  Service: General;  Laterality: Left;  . Excision of Pelvic Absess, Right Ovary     2008  .  RE-EXCISION OF BREAST CANCER,SUPERIOR MARGINS Left 08/31/2017   Procedure: RE-EXCISION OF LEFT  BREAST MARGINS ERAS PATHWAY;  Surgeon: Rolm Bookbinder, MD;  Location: [facility] [place];  Service: General;  Laterality: Left;  . TUBAL LIGATION  1980   Social History   Socioeconomic History  . Marital status: Divorced    Spouse name: Not on file  . Number of children: 1  . Years of education: 35  . Highest education level: Not on file  Occupational History  . Occupation: Social worker, Technical sales engineer: [employer]  Social Needs  . Financial resource strain: Somewhat hard  . Food insecurity:    Worry: Sometimes true    Inability: Sometimes true  . Transportation needs:    Medical: No    Non-medical: No  Tobacco Use  . Smoking status: Former Smoker    Packs/day: 1.00    Years: 50.00    Pack years: 50.00    Types: Cigarettes    Last attempt to quit: 04/16/2011    Years since quitting: 6.3  . Smokeless tobacco: Never Used  . Tobacco comment: quit that date when she had to go to ER   Substance and Sexual Activity  . Alcohol use: Yes    Alcohol/week: 0.0 oz    Comment: rare occasion  . Drug use: No    Comment: no marijuana since 2012  . Sexual activity: Never  Lifestyle  . Physical activity:    Days per week: 3 days    Minutes per session: 30 min  . Stress: Only a little  Relationships  . Social connections:    Talks on phone: More than three times a week    Gets together: More than three times a week    Attends religious service: More than 4 times per year    Active member of club or organization: Not on file    Attends meetings of clubs or organizations: More than 4 times per year    Relationship status: Divorced  . Intimate partner violence:    Fear of current or ex partner: Not on file    Emotionally abused: Not on file    Physically abused: Not on file    Forced sexual activity: Not on file  Other Topics Concern  . Not on file  Social History Narrative   HSG;  [name], [place]. . Married '69 - 9 yrs/divorced. 1 son - '72; no grandchildren.   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 No h/o physical or sexual abuse. ACP - no living will - wants information. Provided packet of information. On 08/05/2011: she stated she was distant cousins to celebrities [name] and [name]      Current Outpatient Medications on File Prior to Visit  Medication Sig Dispense Refill  . acetaminophen (TYLENOL) 500 MG tablet Take 1,000 mg by mouth every 6 (six) hours as needed.    . Albuterol Sulfate (PROAIR RESPICLICK) 371 (90 Base) MCG/ACT AEPB Inhale 1-2 puffs into the lungs every 6 (six) hours as needed. (Patient taking differently: Inhale 1-2 puffs into the lungs every 6 (six) hours as needed (for wheezing/shortness of breath). ) 1 each 5  . amLODipine (NORVASC) 10 MG tablet TAKE ONE TABLET BY MOUTH ONCE DAILY 90 tablet 0  . anastrozole (ARIMIDEX) 1  MG tablet Take 1 tablet (1 mg total) by mouth daily. 90 tablet 4  . atorvastatin (LIPITOR) 40 MG tablet TAKE ONE TABLET BY MOUTH ONCE DAILY IN THE MORNING 90 tablet 0  . Cholecalciferol (VITAMIN D3) 2000 units TABS Take 4,000 Units by mouth daily.    [name] denosumab (PROLIA) 60 MG/ML SOLN injection Inject 60 mg into the skin every 6 (six) months. Administer in upper arm, thigh, or abdomen    . hydrochlorothiazide (HYDRODIURIL) 25 MG tablet Take 1 tablet (25 mg total) by mouth daily. 90 tablet 3  . ipratropium-albuterol (DUONEB) 0.5-2.5 (3) MG/3ML SOLN USE ONE VIAL IN NEBULIZER 4 TIMES DAILY 360 mL 5  . OXYGEN Inhale 2-3 L into the lungs continuous. When exerting self     . potassium chloride SA (K-DUR,KLOR-CON) 20 MEQ tablet Take 1 tablet (20 mEq total) by mouth 2 (two) times daily. (Patient taking differently: Take 20 mEq by mouth 2 (two) times a week. ) 180 tablet 1  . predniSONE (DELTASONE) 10 MG tablet 30 mg x 3days , '20mg'$  x 3days , '10mg'$  x3 days then stop. 18  tablet 0  . sodium chloride (OCEAN) 0.65 % SOLN nasal spray Place 1 spray into both nostrils as needed (dryness).    . Tiotropium Bromide-Olodaterol (STIOLTO RESPIMAT) 2.5-2.5 MCG/ACT AERS Inhale 2 puffs into the lungs daily. 3 Inhaler 0  . tiZANidine (ZANAFLEX) 4 MG tablet Take 1 tablet (4 mg total) by mouth every 8 (eight) hours as needed for muscle spasms. 60 tablet 5  . traMADol (ULTRAM) 50 MG tablet Take 1 tablet (50 mg total) by mouth every 6 (six) hours as needed (mild pain). 10 tablet 0  . traMADol (ULTRAM) 50 MG tablet Take 1 tablet (50 mg total) by mouth every 6 (six) hours as needed. 60 tablet 0   No current facility-administered medications on file prior to visit.    Allergies  Allergen Reactions  . Benicar [Olmesartan] Swelling    Swelling of face and arms   . [name] [Valsartan] Swelling    Swelling of face and arms   . Lisinopril Cough  . Monosodium Glutamate Other (See Comments)    Facial swelling per pt  . Codeine Other (See Comments)    jittery  . Hydrocodone-Acetaminophen Nausea And Vomiting  . Lead Acetate Rash  . Nickel Rash    Severe rash to infection: pt is allergic to all metals other than sterling silver or gold jewelry.    Family History  Problem Relation Age of Onset  . Heart disease Mother        MI - fatal  . Hypertension Mother   . Stroke Father 68       fatal  . Alzheimer's disease Father   . Alzheimer's disease Brother   . Hyperlipidemia Brother   . Hypertension Brother   . Diabetes Brother   . Hypertension Brother   . Hyperlipidemia Brother   . Breast cancer Paternal Grandmother     PE: BP (!) 144/68   Pulse 95   Ht 5' 4.5" (1.638 m)   Wt 218 lb 12.8 oz (99.2 kg)   SpO2 95%   BMI 36.98 kg/m  Wt Readings from Last 3 Encounters:  09/01/17 218 lb 12.8 oz (99.2 kg)  08/30/17 213 lb 9.6 oz (96.9 kg)  08/11/17 218 lb 11.2 oz (99.2 kg)   Constitutional: overweight, in NAD, + on O2 Eyes: PERRLA, EOMI, no exophthalmos, no lid lag, no  stare ENT: moist mucous membranes, no thyromegaly,  no thyroid bruits, no cervical lymphadenopathy Cardiovascular: tachycardia, RR, No MRG Respiratory: CTA B Gastrointestinal: abdomen soft, NT, ND, BS+ Musculoskeletal: no deformities, strength intact in all 4 Skin: moist, warm, no rashes Neurological: no tremor with outstretched hands, DTR normal in all 4  ASSESSMENT: 1. Thyrotoxicosis  2. MNG  PLAN:  1. And 2. Patient with a recently found low TSH, without clear thyrotoxic sxs: no weight loss, heat intolerance, hyperdefecation, palpitations, anxiety.  She does have a high heart rate today, though. - she does not appear to have exogenous causes for the low TSH.  - We discussed that possible causes of thyrotoxicosis are:  Graves ds   Thyroiditis toxic multinodular goiter/ toxic adenoma  - she has multiple nodules in her thyroid as mentioned above - will check the TSH, fT3 and fT4 and also add thyroid stimulating antibodies to screen for Graves' disease.  - If the tests remain abnormal, we may need an uptake and scan to differentiate between the 3 above possible etiologies  - we discussed about possible modalities of treatment for the above conditions, to include methimazole use, radioactive iodine ablation or (last resort) surgery.  If the nodules are overproducing hormones, we do not need to biopsy them as the risk of cancer in the hyper thyroid nodule is very small. A thyroid uptake and scan should help with this decision. - We did discuss that if 1 or more of the nodules are cancerous, she may need thyroidectomy.  I explained the benefits and possible risks.  Vocal cord dysfunction is a rare possible risk, indeed, but she agrees that the benefit of thyroidectomy if the nodules are cancerous overweigh the risk.  If the nodules are not cancerous, we can just follow them expectantly, without surgery.  If the nodules appear hyperactive, RAI treatment would be useful to ablate them.  She agrees  with the plan of obtaining labs first, a thyroid uptake and scan next, if needed, and then biopsy the more worrisome nodules.  We will then regroup and discuss about further plan. - I advised her to join my chart to communicate easier - RTC in 4 months, but likely sooner for repeat labs  Orders Placed This Encounter  Procedures  . TSH  . T4, free  . T3, free  . Thyroid stimulating immunoglobulin   Component     Latest Ref Rng & Units 09/01/2017  TSH     0.35 - 4.50 uIU/mL 0.14 (L)  T4,Free(Direct)     0.60 - 1.60 ng/dL 0.71  Triiodothyronine,Free,Serum     2.3 - 4.2 pg/mL 3.2  TSI     <140 % baseline <89   Mild subclinical hyperthyroidism with negative TSI antibodies.  Suspect toxic multinodular goiter.  We will schedule a thyroid uptake and scan, as discussed with the patient.   Since asymptomatic, will withhold treatment for now.  I will addend the results when they become available.  Philemon Kingdom, MD PhD [facility] Endocrinology

## 2017-09-04 LAB — THYROID STIMULATING IMMUNOGLOBULIN

## 2017-09-05 ENCOUNTER — Telehealth: Payer: Self-pay | Admitting: Internal Medicine

## 2017-09-05 ENCOUNTER — Telehealth: Payer: Self-pay

## 2017-09-05 NOTE — Telephone Encounter (Signed)
Lab results were given prior to this message

## 2017-09-05 NOTE — Telephone Encounter (Signed)
She needs to have the Uptake and scan before the Bx'es.

## 2017-09-05 NOTE — Telephone Encounter (Signed)
Spoke to patient. Gave instructions. Pt verbalized understanding.  

## 2017-09-05 NOTE — Telephone Encounter (Signed)
-----   Message from Philemon Kingdom, MD sent at 09/05/2017  8:57 AM EDT ----- Larey Seat, can you please call pt: Her Graves' antibodies are negative and only 1 of the thyroid test is mildly low.  No treatment needed because the dysfunction is really mild.  I suspect that one or more of her thyroid nodules are overproducing thyroid hormones.  We will schedule a thyroid uptake and scan, as discussed with the patient at the time of her appointment.  I ordered this to be done at [facility].  Please have her let us know if she is not called to schedule this in approximately 1 week.

## 2017-09-05 NOTE — Telephone Encounter (Signed)
Spoke to patient. Gave results and instructions. Pt states Dr. Harlow Asa ordered Thyroid Biopsy scheduled for 05/14. Requesting to know if she should still have biopsy or need to have scan before biopsy or vice versa.

## 2017-09-05 NOTE — Telephone Encounter (Signed)
Patient calling for lab result

## 2017-09-09 ENCOUNTER — Ambulatory Visit: Payer: Medicare Other | Admitting: Pulmonary Disease

## 2017-09-09 ENCOUNTER — Encounter: Payer: Self-pay | Admitting: Pulmonary Disease

## 2017-09-09 ENCOUNTER — Telehealth: Payer: Self-pay | Admitting: Internal Medicine

## 2017-09-09 ENCOUNTER — Other Ambulatory Visit: Payer: Self-pay | Admitting: Internal Medicine

## 2017-09-09 ENCOUNTER — Ambulatory Visit (INDEPENDENT_AMBULATORY_CARE_PROVIDER_SITE_OTHER)
Admission: RE | Admit: 2017-09-09 | Discharge: 2017-09-09 | Disposition: A | Discharge status: Home / Self Care | Payer: Medicare Other | Source: Ambulatory Visit | Attending: Pulmonary Disease | Admitting: Pulmonary Disease

## 2017-09-09 VITALS — BP 158/72 | HR 102 | Ht 64.0 in | Wt 211.8 lb

## 2017-09-09 DIAGNOSIS — J441 Chronic obstructive pulmonary disease with (acute) exacerbation: Secondary | ICD-10-CM | POA: Diagnosis not present

## 2017-09-09 DIAGNOSIS — J9611 Chronic respiratory failure with hypoxia: Secondary | ICD-10-CM

## 2017-09-09 DIAGNOSIS — G4733 Obstructive sleep apnea (adult) (pediatric): Secondary | ICD-10-CM

## 2017-09-09 MED ORDER — REVEFENACIN 175 MCG/3ML IN SOLN: 1.0000 | Freq: Every day | RESPIRATORY_TRACT | 5 refills | Status: DC

## 2017-09-09 MED ORDER — PREDNISONE 10 MG PO TABS: ORAL_TABLET | ORAL | 0 refills | Status: DC

## 2017-09-09 MED ORDER — AMOXICILLIN-POT CLAVULANATE 875-125 MG PO TABS: 1.0000 | ORAL_TABLET | Freq: Two times a day (BID) | ORAL | 0 refills | Status: DC

## 2017-09-09 MED ORDER — ARFORMOTEROL TARTRATE 15 MCG/2ML IN NEBU: 15.0000 ug | INHALATION_SOLUTION | Freq: Two times a day (BID) | RESPIRATORY_TRACT | 6 refills | Status: DC

## 2017-09-09 NOTE — Telephone Encounter (Signed)
I always order this test...  I just want a single uptake in the scan.  Can you please make sure they schedule her. I do not think she needs another order.

## 2017-09-09 NOTE — Patient Instructions (Signed)
Chest xray today Augmentin 1 pill twice per day for 7 days Prednisone 10 mg pill >> 3 pills daily for 2 days, 2 pills daily for 2 days, 1 pill daily for 2 days Yupelri one vial nebulized daily Brovana one vial nebulized twice per day Don't use stiolto after you start yupelri and brovana  Follow up with Dr. Chase Caller next week

## 2017-09-09 NOTE — Telephone Encounter (Signed)
Patient was told by Dr. Cruzita Lederer to call our office if patient has not heard from  [facility] by today re: setting up patient appt for a thyroid scan. Please call patient at ph# [phone] to advise re: scan appt at [facility].

## 2017-09-09 NOTE — Telephone Encounter (Signed)
Called scheduling back and left vm to have them contact myself or the pt to get this scheduled

## 2017-09-09 NOTE — Progress Notes (Signed)
Chief Complaint  Patient presents with  . Acute Visit    Pt is wheezing, SOB on exertion, dissiness, sore throat, productive cough-white, chest tightness, back pain mid back. Pt is very tired, not breathing well for 2 days.    HPI: 72 yo female former smoker with severe COPD.  She is followed by Dr. Chase Caller.  She was seen earlier this month by Dr. Lamonte Sakai for COPD exacerbation.    She has recurrent symptoms.  She has been feeling warm.  Has cough and chest tightness.  She is also getting wheezing.  Bringing up clear to yellow sputum.  Has sinus congestion, sore throat and dry mouth at night.  Has more trouble getting winded with activity.  Uses 3 liters oxygen 24/7.  Uses CPAP at night.  She is worried her mouth gets dry at night.  She is needing to use nebulizer more.  Feels this works better than stiolto.  Used brovana in hospital and this seemed to work better.  She had excision of breast cancer recently and is to start arimidex, and is followed by Dr. Jana Hakim.  She was found to have thyroid nodules and is followed by Dr. Letta Median.   She  has a past medical history of Abdominal aneurysm ([place]), Anemia, Arthritis, Breast cancer in female [facility]), COPD (chronic obstructive pulmonary disease) ([place]), Dyspnea, Headache, Heart murmur, Hyperlipidemia, Hypertension, Multiple thyroid nodules, Murmur, cardiac (1950), Osteoporosis, Pneumonia, Pre-diabetes, Requires continuous at home supplemental oxygen, Sebaceous cyst, Sleep apnea, and Varicella (as child).  She  has a past surgical history that includes Excision of Pelvic Absess, Right Ovary; Appendectomy; Cesarean section; Tubal ligation (1980); Colonoscopy; Cyst removal neck (Left, 04/13/2016); Colonoscopy with propofol (N/A, 04/16/2016); Breast lumpectomy with radioactive seed localization (Left, 08/10/2017); and Re-excision of breast cancer,superior margins (Left, 08/31/2017).  Her family history includes Alzheimer's disease in her brother and father;  Breast cancer in her paternal grandmother; Diabetes in her brother; Heart disease in her mother; Hyperlipidemia in her brother and brother; Hypertension in her brother, brother, and mother; Stroke (age of onset: 41) in her father.  She  reports that she quit smoking about 6 years ago. Her smoking use included cigarettes. She has a 50.00 pack-year smoking history. She has never used smokeless tobacco. She reports that she drinks alcohol. She reports that she does not use drugs.   Vital signs: BP (!) 158/72 (BP Location: Left Arm, Cuff Size: Normal)   Pulse (!) 102   Ht 5\' 4"  (1.626 m)   Wt 211 lb 12.8 oz (96.1 kg)   SpO2 94%   BMI 36.36 kg/m   Physical exam:  General - pleasant, sitting in wheelchair, wearing oxygen Eyes - pupils reactive ENT - no sinus tenderness, no oral exudate, no LAN Cardiac - regular, no murmur Chest - diffuse b/l expiratory wheezing Abd - soft, non tender Ext - no edema Skin - no rashes Neuro - normal strength Psych - normal mood  Pulmonary tests: A1AT 11/23/11 >> MM  PFT 04/15/16 >> FEV1 0.65 (34%), FEV1% 47, DLCO 36% CT chest 10/12/16 >> moderate centrilobular emphysema, scattered nodules up to 5 mm  Sleep tests: PSG 01/12/15 >> AHI 35.6, SpO2 low 79% CPAP titration 01/23/15 >> CPAP 13 cm H2O  Cardiac tests: Echo 05/28/14 >> EF 65 to 70%, grade 1 DD, PAS 46 mmHg  Assessment/plan:  Acute COPD exacerbation. - prednisone, augmentin - CXR today  Severe COPD with emphysema. - seemed to respond better to nebulizer therapy - change to yupelri and brovana  in place of stiolto - prn duoneb  Chronic respiratory failure with hypoxia. - 3 liters oxygen 24/7  Obstructive sleep apnea. - continue CPAP 13 cm H2O   Allergies as of 09/09/2017      Reactions   Benicar [olmesartan] Swelling   Swelling of face and arms    [name] [valsartan] Swelling   Swelling of face and arms    Lisinopril Cough   Monosodium Glutamate Other (See Comments)   Facial  swelling per pt   Codeine Other (See Comments)   jittery   Hydrocodone-acetaminophen Nausea And Vomiting   Lead Acetate Rash   Nickel Rash   Severe rash to infection: pt is allergic to all metals other than sterling silver or gold jewelry.       Medication List        Accurate as of 09/09/17  5:26 PM. Always use your most recent med list.          acetaminophen 500 MG tablet Commonly known as:  TYLENOL Take 1,000 mg by mouth every 6 (six) hours as needed.   Albuterol Sulfate 108 (90 Base) MCG/ACT Aepb Commonly known as:  PROAIR RESPICLICK Inhale 1-2 puffs into the lungs every 6 (six) hours as needed.   amLODipine 10 MG tablet Commonly known as:  NORVASC TAKE ONE TABLET BY MOUTH ONCE DAILY   amoxicillin-clavulanate 875-125 MG tablet Commonly known as:  AUGMENTIN Take 1 tablet by mouth 2 (two) times daily.   anastrozole 1 MG tablet Commonly known as:  ARIMIDEX Take 1 tablet (1 mg total) by mouth daily.   arformoterol 15 MCG/2ML Nebu Commonly known as:  BROVANA Take 2 mLs (15 mcg total) by nebulization 2 (two) times daily.   atorvastatin 40 MG tablet Commonly known as:  LIPITOR TAKE ONE TABLET BY MOUTH ONCE DAILY IN THE MORNING   denosumab 60 MG/ML Soln injection Commonly known as:  PROLIA Inject 60 mg into the skin every 6 (six) months. Administer in upper arm, thigh, or abdomen   hydrochlorothiazide 25 MG tablet Commonly known as:  HYDRODIURIL Take 1 tablet (25 mg total) by mouth daily.   ipratropium-albuterol 0.5-2.5 (3) MG/3ML Soln Commonly known as:  DUONEB USE ONE VIAL IN NEBULIZER 4 TIMES DAILY   OXYGEN Inhale 2-3 L into the lungs continuous. When exerting self   potassium chloride SA 20 MEQ tablet Commonly known as:  K-DUR,KLOR-CON Take 1 tablet (20 mEq total) by mouth 2 (two) times daily.   predniSONE 10 MG tablet Commonly known as:  DELTASONE 3 pills daily for 2 days, 2 pills daily for 2 days, 1 pill daily for 2 days   Revefenacin 175 MCG/3ML  Soln Commonly known as:  YUPELRI Inhale 1 vial into the lungs daily.   sodium chloride 0.65 % Soln nasal spray Commonly known as:  OCEAN Place 1 spray into both nostrils as needed (dryness).   Tiotropium Bromide-Olodaterol 2.5-2.5 MCG/ACT Aers Commonly known as:  STIOLTO RESPIMAT Inhale 2 puffs into the lungs daily.   tiZANidine 4 MG tablet Commonly known as:  ZANAFLEX Take 1 tablet (4 mg total) by mouth every 8 (eight) hours as needed for muscle spasms.   traMADol 50 MG tablet Commonly known as:  ULTRAM Take 1 tablet (50 mg total) by mouth every 6 (six) hours as needed (mild pain).   traMADol 50 MG tablet Commonly known as:  ULTRAM Take 1 tablet (50 mg total) by mouth every 6 (six) hours as needed.   Vitamin D3 2000 units Tabs Take  4,000 Units by mouth daily.       Patient Instructions  Chest xray today Augmentin 1 pill twice per day for 7 days Prednisone 10 mg pill >> 3 pills daily for 2 days, 2 pills daily for 2 days, 1 pill daily for 2 days Yupelri one vial nebulized daily Brovana one vial nebulized twice per day Don't use stiolto after you start yupelri and brovana  Follow up with Dr. Chase Caller next week   Chesley Mires, MD [place] 09/09/2017, 5:26 PM

## 2017-09-09 NOTE — Telephone Encounter (Signed)
[phone]) to schedule and scheduling stated that order needs to be changed to say multiple and not single uptake before I can schedule for pt

## 2017-09-11 DIAGNOSIS — J449 Chronic obstructive pulmonary disease, unspecified: Secondary | ICD-10-CM | POA: Diagnosis not present

## 2017-09-13 ENCOUNTER — Telehealth: Payer: Self-pay | Admitting: Pulmonary Disease

## 2017-09-13 ENCOUNTER — Other Ambulatory Visit: Payer: Self-pay | Admitting: Internal Medicine

## 2017-09-13 DIAGNOSIS — R7989 Other specified abnormal findings of blood chemistry: Secondary | ICD-10-CM

## 2017-09-13 MED ORDER — ARFORMOTEROL TARTRATE 15 MCG/2ML IN NEBU: 15.0000 ug | INHALATION_SOLUTION | Freq: Two times a day (BID) | RESPIRATORY_TRACT | 2 refills | Status: DC

## 2017-09-13 NOTE — Telephone Encounter (Signed)
Dg Chest 2 View  Result Date: 09/09/2017 CLINICAL DATA:  COPD exacerbation EXAM: CHEST - 2 VIEW COMPARISON:  04/18/2017 FINDINGS: Hyperinflation with emphysematous disease. Coarse chronic bronchitic changes and scarring at the bases. No acute consolidation or effusion. Stable cardiomediastinal silhouette with aortic atherosclerosis. No pneumothorax. IMPRESSION: No active cardiopulmonary disease. Stable hyperinflation, chronic bronchitic changes and emphysematous disease. Scarring at the bases. Electronically Signed   By: Donavan Foil M.D.   On: 09/09/2017 17:50    Please let her know that her CXR showed expected changes from COPD, but no evidence for pneumonia.

## 2017-09-13 NOTE — Telephone Encounter (Signed)
Spoke with patient. She is aware of her CXR results. However, she had a concern about her Brovana. She went to the pharmacy to pick up her medication at [facility] but was advised that it would cost over $200. She wanted to know if there was any way we could help find assistance with the cost.   She stated that she uses [employer] as her DME. Advised patient that I could [facility]/[employer] to see how much Garlon Hatchet would cost for her. She verbalized understanding. [facility] stated that her primary insurance would pay 80% and her secondary insurance would pick up the 20%.   Spoke with patient again. She wishes to have this sent to [facility]. Order has been placed. Nothing else needed at time of call.  RX at [facility] has been cancelled.

## 2017-09-14 ENCOUNTER — Ambulatory Visit (INDEPENDENT_AMBULATORY_CARE_PROVIDER_SITE_OTHER): Payer: Medicare Other | Admitting: Internal Medicine

## 2017-09-14 ENCOUNTER — Encounter: Payer: Self-pay | Admitting: Internal Medicine

## 2017-09-14 VITALS — BP 160/58 | HR 99 | Ht 64.0 in | Wt 212.2 lb

## 2017-09-14 DIAGNOSIS — Z129 Encounter for screening for malignant neoplasm, site unspecified: Secondary | ICD-10-CM

## 2017-09-14 DIAGNOSIS — J9611 Chronic respiratory failure with hypoxia: Secondary | ICD-10-CM | POA: Diagnosis not present

## 2017-09-14 DIAGNOSIS — J441 Chronic obstructive pulmonary disease with (acute) exacerbation: Secondary | ICD-10-CM | POA: Diagnosis not present

## 2017-09-14 MED ORDER — ARFORMOTEROL TARTRATE 15 MCG/2ML IN NEBU: 15.0000 ug | INHALATION_SOLUTION | Freq: Two times a day (BID) | RESPIRATORY_TRACT | 2 refills | Status: DC

## 2017-09-14 MED ORDER — REVEFENACIN 175 MCG/3ML IN SOLN: 1.0000 | Freq: Every day | RESPIRATORY_TRACT | 5 refills | Status: DC

## 2017-09-14 MED ORDER — ROFLUMILAST 250 MCG PO TABS: 250.0000 ug | ORAL_TABLET | Freq: Every day | ORAL | 0 refills | Status: DC

## 2017-09-14 MED ORDER — ROFLUMILAST 250 MCG PO TABS: 250.0000 ug | ORAL_TABLET | Freq: Every day | ORAL | 3 refills | Status: DC

## 2017-09-14 MED ORDER — PREDNISONE 10 MG PO TABS: 10.0000 mg | ORAL_TABLET | Freq: Every day | ORAL | 0 refills | Status: DC

## 2017-09-14 MED ORDER — ROFLUMILAST 500 MCG PO TABS: 500.0000 ug | ORAL_TABLET | Freq: Every day | ORAL | 11 refills | Status: DC

## 2017-09-14 NOTE — Patient Instructions (Addendum)
ICD-10-CM   1. Chronic respiratory failure with hypoxia (HCC) J96.11   2. COPD with acute exacerbation ([place]) J44.1   3. Cancer screening Z12.9     Resolving but stil ongoing copd flare up  Plan  - redo prednisone - Take prednisone 40 mg daily x 2 days, then 20mg  daily x 2 days, then 10mg  daily x 2 days, then 5mg  daily x 2 days and stop - start inhaled steroid - arnuity or asmanex or pulmicort depending on insurance - start Take new tablet called roflumilast 1 tablet   - starter pack 250mg  per day after food x 30 days or 1 month  - then escalate to 500mg  daily after food x continue at this dose  - any side effects especially GI call us - continue oxygen 2-3L [place] as before  - stop stiolto and instead do brovana twice daily and yupelri neb daily (this was done last visit by Dr Halford Chessman)  - use albuterol as needed  Followup - in 6 weeks do LDCT lung cancer screen followup  - return to clini in 6 weeks - ok to see APP orMD

## 2017-09-14 NOTE — Progress Notes (Signed)
Subjective:     Patient ID: Victoria Victoria [name] Victoria [name], female   DOB: 1945-06-27, 72 y.o.   MRN: [id number]  HPI   OV 07/20/2017  Chief Complaint  Patient presents with  . Follow-up    Follow up visit. Pt also needing surgical clearance for lumpectomy.  Pt saw RB beginning February annd was givien prednisone. Pt states breathing has been tolerable with not taking prednisone. DME: [facility], 2L continuous at home 3L pulse when out     Follow-up severe COPD patient with obesity and sleep apnea.  She says she is compliant with all her COPD inhalers.  She is also compliant with her CPAP.  She is not smoking anymore.  Overall COPD CAT score is 16.  She uses oxygen all the time and CPAP at night.  She is due for breast surgery lumpectomy and is here for a preoperative evaluation.  She tells me that she is in good nutritional status.  She is not anemic.  She has got good kidney function.  She says the surgery will be elective and less than 2 hours and does not involve operating on the neck or the chest or the abdomen.  It will be done at [facility] and under experience surgeon.  Overall she is very functional and active despite his severe COPD.   OV 09/14/2017  Chief Complaint  Patient presents with  . Follow-up    Pt states she has been doing better since her visit with Dr. Halford Chessman last week. Pt is currently taking an abx and just finished last dose of prednisone today. Pt's cough has become better and is still wheezing some. SOB is about the same.    Follow-up advanced COPD with obesity and sleep apnea and chronic hypoxemic respiratory failure  I last saw her in March 2019 which itself followed in exacerbation.  Since then she has had 2 visits in my office with 2 providers for COPD exacerbation.  Most recently September 09, 2017.  Chest x-ray reviewed but did not visualize and the x-ray is clear.  [Of note she had low-dose CT scan of the chest May 2018 and is due for one-year CT scan coming up anytime now]  her baseline COPD CAT scan.  She is using oxygen between 2-3 L.  She feels her exacerbation is slowly resolving but she is still symptomatic COPD CAT score is 24 and much worse than baseline although this is improved compared to last week.  She did have breast cancer surgery recently and this went well.  Currently breast cancer in remission.  Review of her medication shows that while she was in the hospital nebulized bronchodilator long-acting beta agonist was tried and she really liked it.  Therefore on September 09, 2017 visit her combination inhaler Stiolto was changed to [place] she is here to start this.and Yupelri.  Review of her chart shows that she is not on inhaled corticosteroid.  Sometime back she was on Arnuity but she is not taking it due to lack of perceived benefit.  But now she is having recurrent exacerbations.  Therefore she is open to starting it again   CAT COPD Symptom & Quality of Life Score ([place] trademark) 0 is no burden. 5 is highest burden 07/20/2017  09/14/2017   Never Cough -> Cough all the time 2 3  No phlegm in chest -> Chest is full of phlegm 1 2  No chest tightness -> Chest feels very tight 2 3  No dyspnea for 1 flight  stairs/hill -> Very dyspneic for 1 flight of stairs 4 5  No limitations for ADL at home -> Very limited with ADL at home 2 3  Confident leaving home -> Not at all confident leaving home 0 3  Sleep soundly -> Do not sleep soundly because of lung condition 2 2  Lots of Energy -> No energy at all 3 3  TOTAL Score (max 40)  16 24     Results for Victoria Victoria [name] Victoria [name], Victoria Victoria [name] Victoria [name] (MRN [id number]) as of 07/20/2017 10:59  Ref. Range 04/15/2016 13:02  FEV1-Post Latest Units: L 0.65  FEV1-%Pred-Post Latest Units: % 34  FEV1-%Change-Post Latest Units: % 1   Results for Victoria Victoria [name], Victoria [name] (MRN [id number]) as of 07/20/2017 10:59  Ref. Range 04/15/2016 13:02  DLCO cor Latest Units: ml/min/mmHg 9.47  DLCO cor % pred Latest Units: % 37     has a past medical history of Abdominal  aneurysm ([place]), Anemia, Arthritis, Breast cancer in female [facility]), COPD (chronic obstructive pulmonary disease) ([place]), Dyspnea, Headache, Heart murmur, Hyperlipidemia, Hypertension, Multiple thyroid nodules, Murmur, cardiac (1950), Osteoporosis, Pneumonia, Pre-diabetes, Requires continuous at home supplemental oxygen, Sebaceous cyst, Sleep apnea, and Varicella (as child).   reports that she quit smoking about 6 years ago. Her smoking use included cigarettes. She has a 50.00 pack-year smoking history. She has never used smokeless tobacco.  Past Surgical History:  Procedure Laterality Date  . APPENDECTOMY     2008  . BREAST LUMPECTOMY WITH RADIOACTIVE SEED LOCALIZATION Left 08/10/2017   Procedure: BREAST LUMPECTOMY WITH RADIOACTIVE SEED LOCALIZATION;  Surgeon: Rolm Bookbinder, MD;  Location: [place];  Service: General;  Laterality: Left;  . [place]  . COLONOSCOPY    . COLONOSCOPY WITH PROPOFOL N/A 04/16/2016   Procedure: COLONOSCOPY WITH PROPOFOL;  Surgeon: Carol Ada, MD;  Location: [facility];  Service: Endoscopy;  Laterality: N/A;  . CYST REMOVAL NECK Left 04/13/2016   Procedure: EXCISION OF SEBACEOUS CYST LEFT POSTERIOR NECK;  Surgeon: Armandina Gemma, MD;  Location: [place];  Service: General;  Laterality: Left;  . Excision of Pelvic Absess, Right Ovary     2008  . RE-EXCISION OF BREAST CANCER,SUPERIOR MARGINS Left 08/31/2017   Procedure: RE-EXCISION OF LEFT  BREAST MARGINS ERAS PATHWAY;  Surgeon: Rolm Bookbinder, MD;  Location: [place];  Service: General;  Laterality: Left;  . TUBAL LIGATION  1980    Allergies  Allergen Reactions  . Benicar [Olmesartan] Swelling    Swelling of face and arms   . [name] [Valsartan] Swelling    Swelling of face and arms   . Lisinopril Cough  . Monosodium Glutamate Other (See Comments)    Facial swelling per pt  . Codeine Other (See Comments)    jittery  . Hydrocodone-Acetaminophen Nausea And Vomiting  . Lead Acetate Rash  . Nickel  Rash    Severe rash to infection: pt is allergic to all metals other than sterling silver or gold jewelry.     Immunization History  Administered Date(s) Administered  . Influenza Split 01/16/2011  . Influenza, High Dose Seasonal PF 01/13/2017  . Influenza,inj,Quad PF,6+ Mos 02/01/2013, 02/27/2014, 04/07/2015, 01/08/2016  . Pneumococcal Conjugate-13 02/01/2013  . Pneumococcal Polysaccharide-23 10/29/2014  . Td 12/06/2011    Family History  Problem Relation Age of Onset  . Heart disease Mother        MI - fatal  . Hypertension Mother   . Stroke Father 71       fatal  . Alzheimer's disease  Father   . Alzheimer's disease Brother   . Hyperlipidemia Brother   . Hypertension Brother   . Diabetes Brother   . Hypertension Brother   . Hyperlipidemia Brother   . Breast cancer Paternal Grandmother      Current Outpatient Medications:  .  acetaminophen (TYLENOL) 500 MG tablet, Take 1,000 mg by mouth every 6 (six) hours as needed., Disp: , Rfl:  .  Albuterol Sulfate (PROAIR RESPICLICK) 299 (90 Base) MCG/ACT AEPB, Inhale 1-2 puffs into the lungs every 6 (six) hours as needed. (Patient taking differently: Inhale 1-2 puffs into the lungs every 6 (six) hours as needed (for wheezing/shortness of breath). ), Disp: 1 each, Rfl: 5 .  amLODipine (NORVASC) 10 MG tablet, TAKE ONE TABLET BY MOUTH ONCE DAILY, Disp: 90 tablet, Rfl: 0 .  amoxicillin-clavulanate (AUGMENTIN) 875-125 MG tablet, Take 1 tablet by mouth 2 (two) times daily., Disp: 14 tablet, Rfl: 0 .  anastrozole (ARIMIDEX) 1 MG tablet, Take 1 tablet (1 mg total) by mouth daily., Disp: 90 tablet, Rfl: 4 .  arformoterol (BROVANA) 15 MCG/2ML NEBU, Take 2 mLs (15 mcg total) by nebulization 2 (two) times daily., Disp: 180 mL, Rfl: 2 .  atorvastatin (LIPITOR) 40 MG tablet, TAKE ONE TABLET BY MOUTH ONCE DAILY IN THE MORNING, Disp: 90 tablet, Rfl: 0 .  Cholecalciferol (VITAMIN D3) 2000 units TABS, Take 4,000 Units by mouth daily., Disp: , Rfl:  .   denosumab (PROLIA) 60 MG/ML SOLN injection, Inject 60 mg into the skin every 6 (six) months. Administer in upper arm, thigh, or abdomen, Disp: , Rfl:  .  hydrochlorothiazide (HYDRODIURIL) 25 MG tablet, Take 1 tablet (25 mg total) by mouth daily., Disp: 90 tablet, Rfl: 3 .  ipratropium-albuterol (DUONEB) 0.5-2.5 (3) MG/3ML SOLN, USE ONE VIAL IN NEBULIZER 4 TIMES DAILY, Disp: 360 mL, Rfl: 5 .  OXYGEN, Inhale 2-3 L into the lungs continuous. When exerting self , Disp: , Rfl:  .  potassium chloride SA (K-DUR,KLOR-CON) 20 MEQ tablet, Take 1 tablet (20 mEq total) by mouth 2 (two) times daily. (Patient taking differently: Take 20 mEq by mouth 2 (two) times a week. ), Disp: 180 tablet, Rfl: 1 .  sodium chloride (OCEAN) 0.65 % SOLN nasal spray, Place 1 spray into both nostrils as needed (dryness)., Disp: , Rfl:  .  Tiotropium Bromide-Olodaterol (STIOLTO RESPIMAT) 2.5-2.5 MCG/ACT AERS, Inhale 2 puffs into the lungs daily., Disp: 3 Inhaler, Rfl: 0 .  tiZANidine (ZANAFLEX) 4 MG tablet, Take 1 tablet (4 mg total) by mouth every 8 (eight) hours as needed for muscle spasms., Disp: 60 tablet, Rfl: 5 .  traMADol (ULTRAM) 50 MG tablet, Take 1 tablet (50 mg total) by mouth every 6 (six) hours as needed., Disp: 60 tablet, Rfl: 0 .  Revefenacin (YUPELRI) 175 MCG/3ML SOLN, Inhale 1 vial into the lungs daily. (Patient not taking: Reported on 09/14/2017), Disp: 90 mL, Rfl: 5   Review of Systems     Objective:   Physical Exam  Constitutional: She is oriented to person, place, and time. She appears well-developed and well-nourished. No distress.  HENT:  Head: Normocephalic and atraumatic.  Right Ear: External ear normal.  Left Ear: External ear normal.  Mouth/Throat: Oropharynx is clear and moist. No oropharyngeal exudate.  Eyes: Pupils are equal, round, and reactive to light. Conjunctivae and EOM are normal. Right eye exhibits no discharge. Left eye exhibits no discharge. No scleral icterus.  Neck: Normal range of  motion. Neck supple. No JVD present. No tracheal deviation  present. No thyromegaly present.  Cardiovascular: Normal rate, regular rhythm, normal heart sounds and intact distal pulses. Exam reveals no gallop and no friction rub.  No murmur heard. Pulmonary/Chest: Effort normal. No respiratory distress. She has wheezes. She has no rales. She exhibits no tenderness.  Diffuse wheezing  Abdominal: Soft. Bowel sounds are normal. She exhibits no distension and no mass. There is no tenderness. There is no rebound and no guarding.  Musculoskeletal: Normal range of motion. She exhibits no edema or tenderness.  Lymphadenopathy:    She has no cervical adenopathy.  Neurological: She is alert and oriented to person, place, and time. She has normal reflexes. No cranial nerve deficit. She exhibits normal muscle tone. Coordination normal.  Skin: Skin is warm and dry. No rash noted. She is not diaphoretic. No erythema. No pallor.  Psychiatric: She has a normal mood and affect. Her behavior is normal. Judgment and thought content normal.  Vitals reviewed.    Today's Vitals   09/14/17 1029  BP: (!) 160/58  Pulse: 99  SpO2: 95%  Weight: 212 lb 3.2 oz (96.3 kg)  Height: 5\' 4"  (1.626 m)    Estimated body mass index is 36.42 kg/m as calculated from the following:   Height as of this encounter: 5\' 4"  (1.626 m).   Weight as of this encounter: 212 lb 3.2 oz (96.3 kg).      Assessment:       ICD-10-CM   1. Chronic respiratory failure with hypoxia (HCC) J96.11   2. COPD with acute exacerbation ([place]) J44.1   3. Cancer screening Z12.9         Plan:       Resolving but stil ongoing copd flare up  Plan  - redo prednisone - Take prednisone 40 mg daily x 2 days, then 20mg  daily x 2 days, then 10mg  daily x 2 days, then 5mg  daily x 2 days and stop - start inhaled steroid - arnuity or asmanex or pulmicort depending on insurance - start Take new tablet called roflumilast 1 tablet   - starter pack 250mg   per day after food x 30 days or 1 month  - then escalate to 500mg  daily after food x continue at this dose  - any side effects especially GI call us - continue oxygen 2-3L [place] as before  - stop stiolto and instead do brovana twice daily and yupelri neb daily (this was done last visit by Dr Halford Chessman)  - use albuterol as needed  Followup - in 6 weeks do LDCT lung cancer screen followup  - return to clini in 6 weeks - ok to see APP orMD   > 50% of this > 25 min visit spent in face to face counseling or coordination of care    Dr. Brand Males, M.D., [facility].C.P Pulmonary and Critical Care Medicine Staff Physician, [place] Director - Interstitial Lung Disease  Program  Pulmonary [place] at [place], [place], [id number]  Pager: [phone], If no answer or between  15:00h - 7:00h: call [phone] Telephone: [phone]

## 2017-09-15 ENCOUNTER — Telehealth: Payer: Self-pay | Admitting: Internal Medicine

## 2017-09-15 DIAGNOSIS — J449 Chronic obstructive pulmonary disease, unspecified: Secondary | ICD-10-CM | POA: Diagnosis not present

## 2017-09-15 NOTE — Telephone Encounter (Signed)
Patient is returning call from the office. ° °

## 2017-09-15 NOTE — Telephone Encounter (Signed)
Spoke to pt and cleared up questions she had about upcoming ultrasound

## 2017-09-19 ENCOUNTER — Telehealth: Payer: Self-pay | Admitting: Internal Medicine

## 2017-09-19 NOTE — Telephone Encounter (Signed)
Spoke to Nuclear Med @ [facility]. Scans scheduled for 05/07 & 05/08 not authorized. (Pre-Certs not completed). Spoke w/ patient. Informed of cancellation. Pt states from previous notes it was recommended that she had scans before Thyroid Biopsy on 05/14. Requesting to know if ok to go through w/ biopsy w/out having scans since they will have to be rescheduled.

## 2017-09-19 NOTE — Telephone Encounter (Signed)
Pre cert called stating pt needs to have precert done for Thyroid pt insurance is [facility]. Pt appt is on 5/7 and 5/8. Thank you!

## 2017-09-20 ENCOUNTER — Encounter (HOSPITAL_COMMUNITY): Payer: Medicare Other

## 2017-09-20 ENCOUNTER — Encounter (HOSPITAL_COMMUNITY): Admission: RE | Admit: 2017-09-20 | Payer: Medicare Other | Source: Ambulatory Visit

## 2017-09-20 ENCOUNTER — Telehealth: Payer: Self-pay | Admitting: Internal Medicine

## 2017-09-20 NOTE — Telephone Encounter (Signed)
Attempt to do PA in [facility], was unable called the [employer] PA line [phone]) and was able to set up a PA for this medication.  It has been sent to clinical review. After review they will fax an approval or denial fax and call the patient.  Reference number [id number]  Will route to EP for further review

## 2017-09-20 NOTE — Telephone Encounter (Signed)
[place] patient and informed. Thanks.

## 2017-09-20 NOTE — Telephone Encounter (Signed)
In that case, OK to have the Bx'es first, no pb.

## 2017-09-21 ENCOUNTER — Encounter (HOSPITAL_COMMUNITY): Payer: Medicare Other

## 2017-09-21 DIAGNOSIS — G4733 Obstructive sleep apnea (adult) (pediatric): Secondary | ICD-10-CM | POA: Diagnosis not present

## 2017-09-22 DIAGNOSIS — G4733 Obstructive sleep apnea (adult) (pediatric): Secondary | ICD-10-CM | POA: Diagnosis not present

## 2017-09-27 ENCOUNTER — Ambulatory Visit
Admission: RE | Admit: 2017-09-27 | Discharge: 2017-09-27 | Disposition: A | Discharge status: Home / Self Care | Payer: Medicare Other | Source: Ambulatory Visit | Attending: Surgery | Admitting: Surgery

## 2017-09-27 ENCOUNTER — Other Ambulatory Visit (HOSPITAL_COMMUNITY)
Admission: RE | Admit: 2017-09-27 | Discharge: 2017-09-27 | Disposition: A | Discharge status: Home / Self Care | Payer: Medicare Other | Source: Ambulatory Visit | Attending: Student | Admitting: Student

## 2017-09-27 DIAGNOSIS — E042 Nontoxic multinodular goiter: Secondary | ICD-10-CM | POA: Diagnosis not present

## 2017-09-27 DIAGNOSIS — E041 Nontoxic single thyroid nodule: Secondary | ICD-10-CM | POA: Diagnosis not present

## 2017-10-03 NOTE — Telephone Encounter (Signed)
Called [employer], PA has been approved as of 09/20/17. [place] to make aware.  Nothing further needed.

## 2017-10-11 DIAGNOSIS — J449 Chronic obstructive pulmonary disease, unspecified: Secondary | ICD-10-CM | POA: Diagnosis not present

## 2017-10-13 ENCOUNTER — Telehealth: Payer: Self-pay | Admitting: Internal Medicine

## 2017-10-13 NOTE — Telephone Encounter (Signed)
Spoke with patient. She is needing a letter from MR stating that she is only able to work one day a week for 5 hours. She needs this letter for the [facility] so that they can help her with her mortgage payment. Patient has a history of chronic respiratory failure with hypoxia.   Patient wishes to be called once the letter is complete.   MR, please advise if you are ok with [place] completing this letter. Thanks!

## 2017-10-14 NOTE — Telephone Encounter (Signed)
Called and spoke to patient and let her know we would place letter up front for pick up. Patient thanked staff for letting her know. Letter was printed and placed in accordion folder. Nothing further is needed at this time.

## 2017-10-14 NOTE — Telephone Encounter (Signed)
To Whom It May concern  Victoria [name] with 1946/01/06 as date of birth has severe bordering on very sever copd with chronic hypoxemic respiratory failure. She should not be working more than 5 hours per week and that too limted to 1 day per week   Dr. Brand Males, M.D., [facility].C.P Pulmonary and Critical Care Medicine Staff Physician, [place] Director - Interstitial Lung Disease  Program  Pulmonary [place] [facility] [place], [place], [address]  Pager: [phone], If no answer or between  15:00h - 7:00h: call [phone] Telephone: [phone]

## 2017-10-19 ENCOUNTER — Other Ambulatory Visit: Payer: Self-pay | Admitting: Internal Medicine

## 2017-10-19 DIAGNOSIS — E118 Type 2 diabetes mellitus with unspecified complications: Secondary | ICD-10-CM

## 2017-10-19 DIAGNOSIS — J449 Chronic obstructive pulmonary disease, unspecified: Secondary | ICD-10-CM | POA: Diagnosis not present

## 2017-10-19 DIAGNOSIS — E785 Hyperlipidemia, unspecified: Secondary | ICD-10-CM

## 2017-10-21 NOTE — Telephone Encounter (Signed)
error 

## 2017-10-23 DIAGNOSIS — G4733 Obstructive sleep apnea (adult) (pediatric): Secondary | ICD-10-CM | POA: Diagnosis not present

## 2017-10-25 ENCOUNTER — Other Ambulatory Visit: Payer: Self-pay | Admitting: Internal Medicine

## 2017-10-25 DIAGNOSIS — I10 Essential (primary) hypertension: Secondary | ICD-10-CM

## 2017-10-26 ENCOUNTER — Ambulatory Visit: Payer: Medicare Other | Admitting: Adult Health

## 2017-10-26 ENCOUNTER — Encounter: Payer: Self-pay | Admitting: Adult Health

## 2017-10-26 ENCOUNTER — Telehealth: Payer: Self-pay | Admitting: Adult Health

## 2017-10-26 VITALS — BP 144/86 | HR 96 | Ht 64.0 in | Wt 210.0 lb

## 2017-10-26 DIAGNOSIS — G4733 Obstructive sleep apnea (adult) (pediatric): Secondary | ICD-10-CM

## 2017-10-26 DIAGNOSIS — J9611 Chronic respiratory failure with hypoxia: Secondary | ICD-10-CM

## 2017-10-26 DIAGNOSIS — J449 Chronic obstructive pulmonary disease, unspecified: Secondary | ICD-10-CM | POA: Diagnosis not present

## 2017-10-26 MED ORDER — BUDESONIDE 0.5 MG/2ML IN SUSP: 0.5000 mg | Freq: Two times a day (BID) | RESPIRATORY_TRACT | 12 refills | Status: DC

## 2017-10-26 MED ORDER — REVEFENACIN 175 MCG/3ML IN SOLN: 1.0000 | Freq: Every day | RESPIRATORY_TRACT | 5 refills | Status: DC

## 2017-10-26 NOTE — Progress Notes (Signed)
@Patient  ID: Victoria [name], female    DOB: [birth year], 72 y.o.   MRN: [id number]  No chief complaint on file.   Referring provider: Janith Lima, MD  HPI: 72 year old female former smoker followed for gold 3 COPD and oxygen dependent respiratory failure and obstructive sleep apnea on CPAP  TEST /Events  Pulmonary function test June 2017: stage III COPD FEV1 0.88 L/47% in 10/21/2015 and 0.69 L/37% later in June 2017 CT chest lung cancer screening 10/09/2015: Emphysema with very small lung nodules Echo January 2016: Shows grade 1 diastolic dysfunction Nuclear medicine cardiac stress to 12/11/2015: Normal tests Lexiscan Myoview with ejection fraction 75% PSG 12/2014 >AHI 35/h    10/26/2017 Follow up : COPD , O2 RF  Returns for a one-month follow-up.. Patient has underlying severe COPD.  Is been having difficulty with recurrent exacerbation.  Last visit patient was placed on Daliresp.  Change from Stiolto to [place] and [employer] .  Patient misunderstood instructions and continued on Stiolto and [place].  Which she says she likes them both very much . Maretta Bees was not sent to her from DME.  She also was supposed to be taking the Pulmicort or Arnuity.  She does not have either 1 of these. She says she is tolerating Daliresp.  Denies any nausea vomiting.  Weight loss. Since last visit says that her breathing is doing much better.  She denies any flare of cough or wheezing.  Feels like she is getting her activity tolerance improved.  Allergies  Allergen Reactions  . Benicar [Olmesartan] Swelling    Swelling of face and arms   . [name] [Valsartan] Swelling    Swelling of face and arms   . Lisinopril Cough  . Monosodium Glutamate Other (See Comments)    Facial swelling per pt  . Codeine Other (See Comments)    jittery  . Hydrocodone-Acetaminophen Nausea And Vomiting  . Lead Acetate Rash  . Nickel Rash    Severe rash to infection: pt is allergic to all metals other than sterling  silver or gold jewelry.     Immunization History  Administered Date(s) Administered  . Influenza Split 01/16/2011  . Influenza, High Dose Seasonal PF 01/13/2017  . Influenza,inj,Quad PF,6+ Mos 02/01/2013, 02/27/2014, 04/07/2015, 01/08/2016  . Pneumococcal Conjugate-13 02/01/2013  . Pneumococcal Polysaccharide-23 10/29/2014  . Td 12/06/2011    Past Medical History:  Diagnosis Date  . Abdominal aneurysm ([place])    Dr. Oneida Alar follows lLOV 2 ''17 per pt "around 2 cm"  . Anemia    as a child  . Arthritis    left ankle, right knee, right SI joint, wrists, lower back  . Breast cancer in female [facility])    Left  . COPD (chronic obstructive pulmonary disease) ([place])    ephysema-Dr. Chase Caller  . Dyspnea   . Headache    as a child would have terrible headaches during season changes  . 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 Hyperlipidemia   . Hypertension   . Multiple thyroid nodules   . Murmur, cardiac 1950  . Osteoporosis   . Pneumonia   . Pre-diabetes   . Requires continuous at home supplemental oxygen    2 L 24/7  . Sebaceous cyst    hairline sebaceous cyst left posterior neck to be excised 04-13-16 by Dr. Harlow Asa in [facility]  . Sleep apnea    cpap used sometimes, uses oxygen concentrator 2 l/m nasally bedtime  . Varicella as  child    Tobacco History: Social History   Tobacco Use  Smoking Status Former Smoker  . Packs/day: 1.00  . Years: 50.00  . Pack years: 50.00  . Types: Cigarettes  . Last attempt to quit: 04/16/2011  . Years since quitting: 6.5  Smokeless Tobacco Never Used  Tobacco Comment   quit that date when she had to go to ER    Counseling given: Not Answered Comment: quit that date when she had to go to ER    Outpatient Encounter Medications as of 10/26/2017  Medication Sig  . acetaminophen (TYLENOL) 500 MG tablet Take 1,000 mg by mouth every 6 (six) hours as needed.  . Albuterol Sulfate (PROAIR RESPICLICK) 329 (90 Base) MCG/ACT AEPB Inhale  1-2 puffs into the lungs every 6 (six) hours as needed. (Patient taking differently: Inhale 1-2 puffs into the lungs every 6 (six) hours as needed (for wheezing/shortness of breath). )  . amLODipine (NORVASC) 10 MG tablet TAKE 1 TABLET BY MOUTH ONCE DAILY  . anastrozole (ARIMIDEX) 1 MG tablet Take 1 tablet (1 mg total) by mouth daily.  [name] arformoterol (BROVANA) 15 MCG/2ML NEBU Take 2 mLs (15 mcg total) by nebulization 2 (two) times daily.  . Cholecalciferol (VITAMIN D3) 2000 units TABS Take 4,000 Units by mouth daily.  [name] denosumab (PROLIA) 60 MG/ML SOLN injection Inject 60 mg into the skin every 6 (six) months. Administer in upper arm, thigh, or abdomen  . hydrochlorothiazide (HYDRODIURIL) 25 MG tablet Take 1 tablet (25 mg total) by mouth daily.  . OXYGEN Inhale 2-3 L into the lungs continuous. When exerting self   . potassium chloride SA (K-DUR,KLOR-CON) 20 MEQ tablet Take 1 tablet (20 mEq total) by mouth 2 (two) times daily. (Patient taking differently: Take 20 mEq by mouth 2 (two) times a week. )  . roflumilast (DALIRESP) 500 MCG TABS tablet Take 1 tablet (500 mcg total) by mouth daily.  . sodium chloride (OCEAN) 0.65 % SOLN nasal spray Place 1 spray into both nostrils as needed (dryness).  [name] tiZANidine (ZANAFLEX) 4 MG tablet Take 1 tablet (4 mg total) by mouth every 8 (eight) hours as needed for muscle spasms.  . traMADol (ULTRAM) 50 MG tablet Take 1 tablet (50 mg total) by mouth every 6 (six) hours as needed.  . [DISCONTINUED] amoxicillin-clavulanate (AUGMENTIN) 875-125 MG tablet Take 1 tablet by mouth 2 (two) times daily.  . [DISCONTINUED] Tiotropium Bromide-Olodaterol (STIOLTO RESPIMAT) 2.5-2.5 MCG/ACT AERS Inhale 2 puffs into the lungs daily.  [name] atorvastatin (LIPITOR) 40 MG tablet TAKE 1 TABLET BY MOUTH ONCE DAILY IN THE MORNING (Patient not taking: Reported on 10/26/2017)  . budesonide (PULMICORT) 0.5 MG/2ML nebulizer solution Take 2 mLs (0.5 mg total) by nebulization 2 (two) times daily.  J44.9  . ipratropium-albuterol (DUONEB) 0.5-2.5 (3) MG/3ML SOLN USE ONE VIAL IN NEBULIZER 4 TIMES DAILY (Patient not taking: Reported on 10/26/2017)  . predniSONE (DELTASONE) 10 MG tablet Take 1 tablet (10 mg total) by mouth daily with breakfast. (Patient not taking: Reported on 10/26/2017)  . Revefenacin (YUPELRI) 175 MCG/3ML SOLN Inhale 1 vial into the lungs daily. j44.9  . Roflumilast (DALIRESP) 250 MCG TABS Take 250 mcg by mouth daily. (Patient not taking: Reported on 10/26/2017)  . [DISCONTINUED] budesonide (PULMICORT) 0.5 MG/2ML nebulizer solution Take 2 mLs (0.5 mg total) by nebulization 2 (two) times daily. J44.9  . [DISCONTINUED] Revefenacin (YUPELRI) 175 MCG/3ML SOLN Inhale 1 vial into the lungs daily. (Patient not taking: Reported on 10/26/2017)   No facility-administered encounter  medications on file as of 10/26/2017.      Review of Systems  Constitutional:   No  weight loss, night sweats,  Fevers, chills, + fatigue, or  lassitude.  HEENT:   No headaches,  Difficulty swallowing,  Tooth/dental problems, or  Sore throat,                No sneezing, itching, ear ache, nasal congestion, post nasal drip,   CV:  No chest pain,  Orthopnea, PND, swelling in lower extremities, anasarca, dizziness, palpitations, syncope.   GI  No heartburn, indigestion, abdominal pain, nausea, vomiting, diarrhea, change in bowel habits, loss of appetite, bloody stools.   Resp:  No chest wall deformity  Skin: no rash or lesions.  GU: no dysuria, change in color of urine, no urgency or frequency.  No flank pain, no hematuria   MS:  No joint pain or swelling.  No decreased range of motion.  No back pain.    Physical Exam  Pulse 96   Ht 5\' 4"  (1.626 m)   Wt 210 lb (95.3 kg)   SpO2 94%   BMI 36.05 kg/m   GEN: A/Ox3; pleasant , NAD, elderly on O2    HEENT:  [place]/AT,  EACs-clear, TMs-wnl, NOSE-clear, THROAT-clear, no lesions, no postnasal drip or exudate noted.   NECK:  Supple w/ fair ROM; no JVD;  normal carotid impulses w/o bruits; no thyromegaly or nodules palpated; no lymphadenopathy.    RESP  Decreased BS in bases  no accessory muscle use, no dullness to percussion  CARD:  RRR, no m/r/g, no peripheral edema, pulses intact, no cyanosis or clubbing.  GI:   Soft & nt; nml bowel sounds; no organomegaly or masses detected.   Musco: Warm bil, no deformities or joint swelling noted.   Neuro: alert, no focal deficits noted.    Skin: Warm, no lesions or rashes    Lab Results:  CBC  BNP No results found for: BNP  ProBNP No results found for: PROBNP  Imaging: [place] Fna Biopsy Thyroid 1st Lesion  Result Date: 09/27/2017 INDICATION: Indeterminate thyroid nodule of the right mid and right inferior thyroid. Request is made for fine-needle aspiration of indeterminate thyroid nodules. EXAM: ULTRASOUND GUIDED FINE NEEDLE ASPIRATION OF INDETERMINATE THYROID NODULE COMPARISON:  US THYROID 07/29/2017 MEDICATIONS: 5 mL 1% lidocaine COMPLICATIONS: None immediate. TECHNIQUE: Informed written consent was obtained from the patient after a discussion of the risks, benefits and alternatives to treatment. Questions regarding the procedure were encouraged and answered. A timeout was performed prior to the initiation of the procedure. Pre-procedural ultrasound scanning demonstrated unchanged size and appearance of the indeterminate nodules within the right mid and right inferior thyroid. The procedure was planned. The neck was prepped in the usual sterile fashion, and a sterile drape was applied covering the operative field. A timeout was performed prior to the initiation of the procedure. Local anesthesia was provided with 1% lidocaine. Under direct ultrasound guidance, 5 FNA biopsies were performed of the right mid thyroid nodule with a 25 gauge needle. Multiple ultrasound images were saved for procedural documentation purposes. The samples were prepared and submitted to pathology. Sample was also collected  for Afirma testing. Under direct ultrasound guidance, 5 FNA biopsies were performed of the right inferior thyroid nodule with a 25 gauge needle. Multiple ultrasound images were saved for procedural documentation purposes. The samples were prepared and submitted to pathology. Sample was also collected for Afirma testing. Limited post procedural scanning was negative for hematoma or additional  complication. Dressings were placed. The patient tolerated the above procedures procedure well without immediate postprocedural complication. FINDINGS: Nodule reference number based on prior diagnostic ultrasound: 5 Maximum size: 1.8 cm Location: Right; Mid ACR TI-RADS risk category: TR4 (4-6 points) Reason for biopsy: meets ACR TI-RADS criteria _________________________________________________________ Nodule reference number based on prior diagnostic ultrasound: 7 Maximum size: 1.6 cm Location: Right; Inferior ACR TI-RADS risk category: TR4 (4-6 points) Reason for biopsy: meets ACR TI-RADS criteria Ultrasound imaging confirms appropriate placement of the needles within the thyroid nodule. IMPRESSION: 1. Technically successful ultrasound guided fine needle aspiration of indeterminate thyroid nodule of the right mid thyroid. 2. Technically successful ultrasound guided fine needle aspiration of indeterminate thyroid nodule of the right inferior thyroid. Read by: Brynda Greathouse PA-C Electronically Signed   By: Sandi Mariscal M.D.   On: 09/27/2017 16:57   [place] Fna Biopsy Thyroid 1st Lesion  Result Date: 09/27/2017 INDICATION: Indeterminate thyroid nodule of the right mid and right inferior thyroid. Request is made for fine-needle aspiration of indeterminate thyroid nodules. EXAM: ULTRASOUND GUIDED FINE NEEDLE ASPIRATION OF INDETERMINATE THYROID NODULE COMPARISON:  US THYROID 07/29/2017 MEDICATIONS: 5 mL 1% lidocaine COMPLICATIONS: None immediate. TECHNIQUE: Informed written consent was obtained from the patient after a discussion of  the risks, benefits and alternatives to treatment. Questions regarding the procedure were encouraged and answered. A timeout was performed prior to the initiation of the procedure. Pre-procedural ultrasound scanning demonstrated unchanged size and appearance of the indeterminate nodules within the right mid and right inferior thyroid. The procedure was planned. The neck was prepped in the usual sterile fashion, and a sterile drape was applied covering the operative field. A timeout was performed prior to the initiation of the procedure. Local anesthesia was provided with 1% lidocaine. Under direct ultrasound guidance, 5 FNA biopsies were performed of the right mid thyroid nodule with a 25 gauge needle. Multiple ultrasound images were saved for procedural documentation purposes. The samples were prepared and submitted to pathology. Sample was also collected for Afirma testing. Under direct ultrasound guidance, 5 FNA biopsies were performed of the right inferior thyroid nodule with a 25 gauge needle. Multiple ultrasound images were saved for procedural documentation purposes. The samples were prepared and submitted to pathology. Sample was also collected for Afirma testing. Limited post procedural scanning was negative for hematoma or additional complication. Dressings were placed. The patient tolerated the above procedures procedure well without immediate postprocedural complication. FINDINGS: Nodule reference number based on prior diagnostic ultrasound: 5 Maximum size: 1.8 cm Location: Right; Mid ACR TI-RADS risk category: TR4 (4-6 points) Reason for biopsy: meets ACR TI-RADS criteria _________________________________________________________ Nodule reference number based on prior diagnostic ultrasound: 7 Maximum size: 1.6 cm Location: Right; Inferior ACR TI-RADS risk category: TR4 (4-6 points) Reason for biopsy: meets ACR TI-RADS criteria Ultrasound imaging confirms appropriate placement of the needles within the  thyroid nodule. IMPRESSION: 1. Technically successful ultrasound guided fine needle aspiration of indeterminate thyroid nodule of the right mid thyroid. 2. Technically successful ultrasound guided fine needle aspiration of indeterminate thyroid nodule of the right inferior thyroid. Read by: Brynda Greathouse PA-C Electronically Signed   By: Sandi Mariscal M.D.   On: 09/27/2017 16:57     Assessment & Plan:   COPD, severe ([place]) Recurrent flare now improving on Daliresp  Neb meds needs reorganizing : will try the regimen below  If insurance does not cover consider Stiolto along with Budesonide neb (if affordable with insurance )  Plan  Patient Instructions  Stop Stiolto .  Continue on [employer] Twice daily   Begin Budesonide neb Twice daily   Begin [employer] daily .  Continue on Daliresp. daily .  Continue oxygen 2l/m . (3l/m on POC) .  Follow up with Dr. Chase Caller in 3 months and As needed         Chronic respiratory failure with hypoxia ([place]) Cont on o2   OSA (obstructive sleep apnea) Cont on CPAP At bedtime       Rexene Edison, NP 10/26/2017

## 2017-10-26 NOTE — Assessment & Plan Note (Signed)
Cont on CPAP At bedtime  

## 2017-10-26 NOTE — Assessment & Plan Note (Signed)
Cont on o2 .  

## 2017-10-26 NOTE — Telephone Encounter (Signed)
Spoke to [employer], Public librarian.  Matt stated that Maretta Bees will need to be ran under part B at local pharmacy.  lmtcb x1 for pt to make her aware of above message.  Will roue tot TP as a FYI.

## 2017-10-26 NOTE — Assessment & Plan Note (Signed)
Recurrent flare now improving on Daliresp  Neb meds needs reorganizing : will try the regimen below  If insurance does not cover consider Stiolto along with Budesonide neb (if affordable with insurance )  Plan  Patient Instructions  Stop Stiolto .  Continue on [employer] Twice daily   Begin Budesonide neb Twice daily   Begin [employer] daily .  Continue on Daliresp. daily .  Continue oxygen 2l/m . (3l/m on POC) .  Follow up with Dr. Chase Caller in 3 months and As needed

## 2017-10-26 NOTE — Patient Instructions (Addendum)
Stop Stiolto .  Continue on [employer] Twice daily   Begin Budesonide neb Twice daily   Begin [employer] daily .  Continue on Daliresp. daily .  Continue oxygen 2l/m . (3l/m on POC) .  Follow up with Dr. Chase Caller in 3 months and As needed

## 2017-10-27 DIAGNOSIS — J449 Chronic obstructive pulmonary disease, unspecified: Secondary | ICD-10-CM | POA: Diagnosis not present

## 2017-10-27 NOTE — Telephone Encounter (Signed)
Called and spoke to [place] with [employer]. Estill Bamberg stated that since Mort Sawyers is a new medication. Pt must have a secondary insurance, as primary insurance pays 80% and secondary picks up the additional  20%. Estill Bamberg stated that pt has medicare as secondary, and Mort Sawyers can not be filed under medicare as a secondary. Estill Bamberg stated that pt will need to get Rx from local pharmacy.  Will route to TP to make aware.

## 2017-10-27 NOTE — Telephone Encounter (Signed)
Order has been placed per Tammy Parrett.

## 2017-10-27 NOTE — Telephone Encounter (Signed)
Called patient and patient says she was contacted by [employer] this morning and they her insurance is going to cover her medications and will continue with the recommendations from yesterday's office visit.  Please send an order for portable nebulizer unit.  Patient says that her insurance will cover and [employer] needs an order for this.

## 2017-10-28 ENCOUNTER — Ambulatory Visit (INDEPENDENT_AMBULATORY_CARE_PROVIDER_SITE_OTHER)
Admission: RE | Admit: 2017-10-28 | Discharge: 2017-10-28 | Disposition: A | Discharge status: Home / Self Care | Payer: Medicare Other | Source: Ambulatory Visit | Attending: Acute Care | Admitting: Acute Care

## 2017-10-28 DIAGNOSIS — Z87891 Personal history of nicotine dependence: Secondary | ICD-10-CM

## 2017-11-02 ENCOUNTER — Telehealth: Payer: Self-pay | Admitting: Internal Medicine

## 2017-11-02 DIAGNOSIS — Z122 Encounter for screening for malignant neoplasm of respiratory organs: Secondary | ICD-10-CM

## 2017-11-02 DIAGNOSIS — Z87891 Personal history of nicotine dependence: Secondary | ICD-10-CM

## 2017-11-02 NOTE — Telephone Encounter (Signed)
Advised pt of results. Pt understood. Langley Gauss I didn't want to order the test since I am not sure what to do with lung cancer screenings CT. Please put in a CT to be done next year in June. Thanks     Notes recorded by Magdalen Spatz, NP on 11/01/2017 at 3:26 PM EDT Please call patient and let them know their low dose Ct was read as a Lung RADS 2: nodules that are benign in appearance and behavior with a very low likelihood of becoming a clinically active cancer due to size or lack of growth. Recommendation per radiology is for a repeat LDCT in 12 months..Please let them know we will order and schedule their annual screening scan for 10/2018. Please let them know there was notation of CAD on their scan. Please remind the patient that this is a non-gated exam therefore degree or severity of disease cannot be determined. Please have them follow up with their PCP regarding potential risk factor modification, dietary therapy or pharmacologic therapy if clinically indicated. Pt. is currently on statin therapy. Please place order for annual screening scan for 10/2018 and fax results to PCP. Thanks so much.

## 2017-11-11 DIAGNOSIS — J449 Chronic obstructive pulmonary disease, unspecified: Secondary | ICD-10-CM | POA: Diagnosis not present

## 2017-11-11 DIAGNOSIS — J45909 Unspecified asthma, uncomplicated: Secondary | ICD-10-CM | POA: Diagnosis not present

## 2017-11-16 DIAGNOSIS — J449 Chronic obstructive pulmonary disease, unspecified: Secondary | ICD-10-CM | POA: Diagnosis not present

## 2017-11-22 ENCOUNTER — Other Ambulatory Visit: Payer: Self-pay

## 2017-11-22 DIAGNOSIS — C50412 Malignant neoplasm of upper-outer quadrant of left female breast: Secondary | ICD-10-CM

## 2017-11-22 DIAGNOSIS — G4733 Obstructive sleep apnea (adult) (pediatric): Secondary | ICD-10-CM | POA: Diagnosis not present

## 2017-11-22 DIAGNOSIS — Z170 Estrogen receptor positive status [ER+]: Secondary | ICD-10-CM

## 2017-11-22 NOTE — Progress Notes (Signed)
[place]  [phone] Fax:([phone]     ID: Victoria [name] DOB: [birth year]  MR#: [id number]  ONG#:[id number]  Patient Care Team: Janith Lima, MD as PCP - General (Internal Medicine) Brand Males, MD as Consulting Physician (Pulmonary Disease) Arvella Nigh, MD as Consulting Physician (Obstetrics and Gynecology) Renato Shin, MD as Consulting Physician (Endocrinology) Chalice Philbert, Virgie Dad, MD as Consulting Physician (Oncology) Rolm Bookbinder, MD as Consulting Physician (General Surgery) Gery Pray, MD as Consulting Physician (Radiation Oncology) OTHER MD:  CHIEF COMPLAINT: Estrogen receptor positive breast cancer   CURRENT TREATMENT: Anastrozole   HISTORY OF CURRENT ILLNESS: From the original intake note:  Victoria [name] had routine screening mammography on 06/16/2017 showing a possible abnormality in the left breast. She underwent unilateral left diagnostic mammography with tomography and left breast ultrasonography at [facility] on 06/22/2017 showing: breast density category B. Suspicious left breast mass in the 2:30 position upper outer quadrant measuring 0.6 x 0.7 x 0.6 cm located 2 cm from the nipple. Palpable thickening with distortion. There was no sonographic evidence of lest axillary lymphadenopathy.  Accordingly on 06/24/2017 she proceeded to biopsy of the left breast mass in question. The pathology from this procedure showed ([id number]):  Invasive mammary carcinoma, grade 1. E-cadherin is positive supporting Ductal carcinoma diagnosis. Prognostic indicators significant for: estrogen receptor, 100% positive and progesterone receptor, 100% positive, both with strong staining intensity. Proliferation marker Ki67 at 5%. HER2 not amplified with ratios of HER2/CEP17 signals 1.21 and average copies per cell 2.00.  The patient's subsequent history is as detailed below.  INTERVAL HISTORY: Victoria [name] presents to the office today for  follow up of her estrogen receptor positive breast cancer. She has been doing well overall.   Since her last visit here she underwent additional left excision and surgery for margin clearance on 08/31/2017.  The pathology from this procedure ([id number]) showed atypical ductal hyperplasia but clear margins.  She has followed up with Dr. Donne Hazel since her recent procedure due to sharp pains to her breast and she was advised to take tylenol PM. She notes that her pain has improved since the visit.   She started on on anastrozole 09/14/2017, with good tolerance. She denies hot flashes or vaginal dryness. She pays about $10 for a 90 day prescription.   On 10/28/2017 she underwent a low dose CT chest for lung cancer screening, showing Lung-RADS 2S, benign appearance or behavior.  However, there was significant aortic atherosclerosis, in addition to left main and 3 vessel coronary artery disease as well as severe centrilobular and paraseptal emphysema; imaging findings suggestive of underlying COPD. 2 large left adrenal lesions are stable compared to prior examinations and compatible with benign adenomas. Indeterminate right adrenal lesion is also stable compared to prior studies, likely to represent a benign lesion such as a lipid poor adenoma.     REVIEW OF SYSTEMS: Victoria [name] reports that for exercise, she is able to walk and dance around her home, which she enjoys. Her pulmonologist, is Dr. Chase Caller and she hasn't been evaluated by a cardiologist in awhile. She was previously seen by Cardiologist, Dr. Claiborne Billings. She denies unusual headaches, visual changes, nausea, vomiting, or dizziness. There has been no unusual cough, phlegm production, or pleurisy. This been no change in bowel or bladder habits. She denies unexplained fatigue or unexplained weight loss, bleeding, rash, or fever. A detailed review of systems was otherwise stable.     PAST MEDICAL HISTORY: Past Medical History:  Diagnosis Date  .  Abdominal aneurysm ([place])    Dr. Oneida Alar follows lLOV 2 ''17 per pt "around 2 cm"  . Anemia    as a child  . Arthritis    left ankle, right knee, right SI joint, wrists, lower back  . Breast cancer in female [facility])    Left  . COPD (chronic obstructive pulmonary disease) ([place])    ephysema-Dr. Chase Caller  . Dyspnea   . Headache    as a child would have terrible headaches during season changes  . 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 Hyperlipidemia   . Hypertension   . Multiple thyroid nodules   . Murmur, cardiac 1950  . Osteoporosis   . Pneumonia   . Pre-diabetes   . Requires continuous at home supplemental oxygen    2 L 24/7  . Sebaceous cyst    hairline sebaceous cyst left posterior neck to be excised 04-13-16 by Dr. Harlow Asa in [facility]  . Sleep apnea    cpap used sometimes, uses oxygen concentrator 2 l/m nasally bedtime  . Varicella as child    PAST SURGICAL HISTORY: Past Surgical History:  Procedure Laterality Date  . APPENDECTOMY     2008  . BREAST LUMPECTOMY WITH RADIOACTIVE SEED LOCALIZATION Left 08/10/2017   Procedure: BREAST LUMPECTOMY WITH RADIOACTIVE SEED LOCALIZATION;  Surgeon: Rolm Bookbinder, MD;  Location: [facility];  Service: General;  Laterality: Left;  . [place]  . COLONOSCOPY    . COLONOSCOPY WITH PROPOFOL N/A 04/16/2016   Procedure: COLONOSCOPY WITH PROPOFOL;  Surgeon: Carol Ada, MD;  Location: [facility];  Service: Endoscopy;  Laterality: N/A;  . CYST REMOVAL NECK Left 04/13/2016   Procedure: EXCISION OF SEBACEOUS CYST LEFT POSTERIOR NECK;  Surgeon: Armandina Gemma, MD;  Location: [place];  Service: General;  Laterality: Left;  . Excision of Pelvic Absess, Right Ovary     2008  . RE-EXCISION OF BREAST CANCER,SUPERIOR MARGINS Left 08/31/2017   Procedure: RE-EXCISION OF LEFT  BREAST MARGINS ERAS PATHWAY;  Surgeon: Rolm Bookbinder, MD;  Location: [facility] [place];  Service: General;  Laterality: Left;  . TUBAL LIGATION  1980    FAMILY  HISTORY Family History  Problem Relation Age of Onset  . Heart disease Mother        MI - fatal  . Hypertension Mother   . Stroke Father 53       fatal  . Alzheimer's disease Father   . Alzheimer's disease Brother   . Hyperlipidemia Brother   . Hypertension Brother   . Diabetes Brother   . Hypertension Brother   . Hyperlipidemia Brother   . Breast cancer Paternal Grandmother   The patient's father died at age 57 due to a stroke and prostate cancer.  The patient's mother died at age 81 due to a heart attack and blood clot. The patient has 3 brothers and no sisters. She notes a paternal grandmother who was diagnosed with breast cancer at age 14. She notes a paternal cousin with lung cancer. She denies a family history of ovarian cancer.  GYNECOLOGIC HISTORY:  No LMP recorded. Patient is postmenopausal. Menarche: 72 years old Age at first live birth: 72 years old [place] P1 LMP: age 35 The patient had a right ovarian cyst which required removal. She notes that she still has her left ovary. She was on HRT for a short amount of time, but discontinued due to negative side affects. She was placed on [place] as  well.    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 She helps individuals with [employer] and program assisting on the computer. She is divorced. The patient's son, Nariya Neumeyer, is disabled and works part-time at [employer] in [place]. The patient has 3 grandchildren and 3 great-grandchildren. She attends [employer].      ADVANCED DIRECTIVES:  Son, Eliseo Gum and daughter in law, Butch Penny   HEALTH MAINTENANCE: Social History   Tobacco Use  . Smoking status: Former Smoker    Packs/day: 1.00    Years: 50.00    Pack years: 50.00    Types: Cigarettes    Last attempt to quit: 04/16/2011    Years since quitting: 6.6  . Smokeless tobacco: Never Used  .  Tobacco comment: quit that date when she had to go to ER   Substance Use Topics  . Alcohol use: Yes    Alcohol/week: 0.0 oz    Comment: rare occasion  . Drug use: No    Comment: no marijuana since 2012     Colonoscopy: December 2017/ Dr. Benson Norway  PAP: September 2016  Bone density: 12/08/2016 showed a T score of -2.4   Allergies  Allergen Reactions  . Benicar [Olmesartan] Swelling    Swelling of face and arms   . [name] [Valsartan] Swelling    Swelling of face and arms   . Lisinopril Cough  . Monosodium Glutamate Other (See Comments)    Facial swelling per pt  . Codeine Other (See Comments)    jittery  . Hydrocodone-Acetaminophen Nausea And Vomiting  . Lead Acetate Rash  . Nickel Rash    Severe rash to infection: pt is allergic to all metals other than sterling silver or gold jewelry.     Current Outpatient Medications  Medication Sig Dispense Refill  . acetaminophen (TYLENOL) 500 MG tablet Take 1,000 mg by mouth every 6 (six) hours as needed.    . Albuterol Sulfate (PROAIR RESPICLICK) 831 (90 Base) MCG/ACT AEPB Inhale 1-2 puffs into the lungs every 6 (six) hours as needed. (Patient taking differently: Inhale 1-2 puffs into the lungs every 6 (six) hours as needed (for wheezing/shortness of breath). ) 1 each 5  . amLODipine (NORVASC) 10 MG tablet TAKE 1 TABLET BY MOUTH ONCE DAILY 90 tablet 0  . anastrozole (ARIMIDEX) 1 MG tablet Take 1 tablet (1 mg total) by mouth daily. 90 tablet 4  . arformoterol (BROVANA) 15 MCG/2ML NEBU Take 2 mLs (15 mcg total) by nebulization 2 (two) times daily. 180 mL 2  . atorvastatin (LIPITOR) 40 MG tablet TAKE 1 TABLET BY MOUTH ONCE DAILY IN THE MORNING (Patient not taking: Reported on 10/26/2017) 90 tablet 0  . budesonide (PULMICORT) 0.5 MG/2ML nebulizer solution Take 2 mLs (0.5 mg total) by nebulization 2 (two) times daily. J44.9 120 mL 12  . Cholecalciferol (VITAMIN D3) 2000 units TABS Take 4,000 Units by mouth daily.    [name] denosumab (PROLIA) 60 MG/ML  SOLN injection Inject 60 mg into the skin every 6 (six) months. Administer in upper arm, thigh, or abdomen    . hydrochlorothiazide (HYDRODIURIL) 25 MG tablet Take 1 tablet (25 mg total) by mouth daily. 90 tablet 3  . ipratropium-albuterol (DUONEB) 0.5-2.5 (3) MG/3ML SOLN USE ONE VIAL IN NEBULIZER 4 TIMES DAILY (Patient not taking: Reported on 10/26/2017) 360 mL 5  . OXYGEN Inhale 2-3 L into the lungs continuous. When exerting self     .  potassium chloride SA (K-DUR,KLOR-CON) 20 MEQ tablet Take 1 tablet (20 mEq total) by mouth 2 (two) times daily. (Patient taking differently: Take 20 mEq by mouth 2 (two) times a week. ) 180 tablet 1  . predniSONE (DELTASONE) 10 MG tablet Take 1 tablet (10 mg total) by mouth daily with breakfast. (Patient not taking: Reported on 10/26/2017) 20 tablet 0  . Revefenacin (YUPELRI) 175 MCG/3ML SOLN Inhale 1 vial into the lungs daily. j44.9 90 mL 5  . Roflumilast (DALIRESP) 250 MCG TABS Take 250 mcg by mouth daily. (Patient not taking: Reported on 10/26/2017) 28 tablet 0  . roflumilast (DALIRESP) 500 MCG TABS tablet Take 1 tablet (500 mcg total) by mouth daily. 30 tablet 11  . sodium chloride (OCEAN) 0.65 % SOLN nasal spray Place 1 spray into both nostrils as needed (dryness).    [name] tiZANidine (ZANAFLEX) 4 MG tablet Take 1 tablet (4 mg total) by mouth every 8 (eight) hours as needed for muscle spasms. 60 tablet 5  . traMADol (ULTRAM) 50 MG tablet Take 1 tablet (50 mg total) by mouth every 6 (six) hours as needed. 60 tablet 0   No current facility-administered medications for this visit.     OBJECTIVE: Older African-American woman wearing oxygen by nasal cannula  Vitals:   11/23/17 1129  BP: (!) 167/66  Pulse: 90  Resp: 18  Temp: 98.1 F (36.7 C)  SpO2: 100%     Body mass index is 35.82 kg/m.   Wt Readings from Last 3 Encounters:  11/23/17 208 lb 11.2 oz (94.7 kg)  10/26/17 210 lb (95.3 kg)  09/14/17 212 lb 3.2 oz (96.3 kg)      ECOG FS:2 - Symptomatic, <50%  confined to bed  Sclerae unicteric, pupils round and equal No cervical or supraclavicular adenopathy Lungs no rales or rhonchi Heart regular rate and rhythm Abd soft, obese, nontender, positive bowel sounds MSK no focal spinal tenderness, no upper extremity lymphedema Neuro: nonfocal, well oriented, positive affect Breasts: The right breast is benign.  The left breast is status post lumpectomy.  The cosmetic result is excellent.  There are no palpable abnormalities.  Both axillae are benign.   LAB RESULTS:  CMP     Component Value Date/Time   NA 139 11/23/2017 1117   K 3.3 (L) 11/23/2017 1117   CL 97 (L) 11/23/2017 1117   CO2 31 11/23/2017 1117   GLUCOSE 133 (H) 11/23/2017 1117   BUN 17 11/23/2017 1117   CREATININE 0.98 11/23/2017 1117   CALCIUM 10.0 11/23/2017 1117   PROT 7.2 11/23/2017 1117   ALBUMIN 3.8 11/23/2017 1117   AST 12 (L) 11/23/2017 1117   ALT 9 11/23/2017 1117   ALKPHOS 100 11/23/2017 1117   BILITOT 0.4 11/23/2017 1117   GFRNONAA 56 (L) 11/23/2017 1117   GFRAA >60 11/23/2017 1117    Lab Results  Component Value Date   TOTALPROTELP 6.7 02/28/2014   ALBUMINELP 57.4 02/28/2014   A1GS 4.9 02/28/2014   A2GS 8.5 02/28/2014   BETS 7.5 (H) 02/28/2014   BETA2SER 5.3 02/28/2014   GAMS 16.4 02/28/2014   MSPIKE 0.31 02/28/2014   SPEI SEE NOTE 02/28/2014    No results found for: KPAFRELGTCHN, LAMBDASER, KAPLAMBRATIO  Lab Results  Component Value Date   WBC 10.6 (H) 11/23/2017   NEUTROABS 6.7 (H) 11/23/2017   HGB 12.8 11/23/2017   HCT 38.6 11/23/2017   MCV 86.7 11/23/2017   PLT 223 11/23/2017    _0 @  No results found for: LABCA2  No components found for: KCLEXN170  No results for input(s): INR in the last 168 hours.  No results found for: LABCA2  No results found for: YFV494  No results found for: WHQ759  No results found for: FMB846  No results found for: CA2729  No components found for: HGQUANT  No results found for: CEA1 /  No results found for: CEA1   No results found for: AFPTUMOR  No results found for: CHROMOGRNA  No results found for: PSA1  Appointment on 11/23/2017  Component Date Value Ref Range Status  . Sodium 11/23/2017 139  135 - 145 mmol/L Final   Please note reference intervals were recently updated.  . Potassium 11/23/2017 3.3* 3.5 - 5.1 mmol/L Final  . Chloride 11/23/2017 97* 98 - 111 mmol/L Final  . CO2 11/23/2017 31  22 - 32 mmol/L Final  . Glucose, Bld 11/23/2017 133* 70 - 99 mg/dL Final  . BUN 11/23/2017 17  8 - 23 mg/dL Final   Please note change in reference range.  . Creatinine 11/23/2017 0.98  0.44 - 1.00 mg/dL Final  . Calcium 11/23/2017 10.0  8.9 - 10.3 mg/dL Final  . Total Protein 11/23/2017 7.2  6.5 - 8.1 g/dL Final  . Albumin 11/23/2017 3.8  3.5 - 5.0 g/dL Final  . AST 11/23/2017 12* 15 - 41 U/L Final  . ALT 11/23/2017 9  0 - 44 U/L Final  . Alkaline Phosphatase 11/23/2017 100  38 - 126 U/L Final  . Total Bilirubin 11/23/2017 0.4  0.3 - 1.2 mg/dL Final  . GFR, Est Non Af Am 11/23/2017 56* >60 mL/min Final  . GFR, Est AFR Am 11/23/2017 >60  >60 mL/min Final   Comment: (NOTE) The eGFR has been calculated using the CKD EPI equation. This calculation has not been validated in all clinical situations. eGFR's persistently <60 mL/min signify possible Chronic Kidney Disease.   Georgiann Hahn gap 11/23/2017 11  5 - 15 Final   Performed at [facility], [place] [address], [place], [place] [address]  . WBC Count 11/23/2017 10.6* 3.9 - 10.3 K/uL Final  . RBC 11/23/2017 4.45  3.70 - 5.45 MIL/uL Final  . Hemoglobin 11/23/2017 12.8  11.6 - 15.9 g/dL Final  . HCT 11/23/2017 38.6  34.8 - 46.6 % Final  . MCV 11/23/2017 86.7  79.5 - 101.0 fL Final  . MCH 11/23/2017 28.8  25.1 - 34.0 pg Final  . MCHC 11/23/2017 33.2  31.5 - 36.0 g/dL Final  . RDW 11/23/2017 13.4  11.2 - 14.5 % Final  . Platelet Count 11/23/2017 223  145 - 400 K/uL Final  . Neutrophils Relative %  11/23/2017 64  % Final  . Neutro Abs 11/23/2017 6.7* 1.5 - 6.5 K/uL Final  . Lymphocytes Relative 11/23/2017 26  % Final  . Lymphs Abs 11/23/2017 2.7  0.9 - 3.3 K/uL Final  . Monocytes Relative 11/23/2017 7  % Final  . Monocytes Absolute 11/23/2017 0.7  0.1 - 0.9 K/uL Final  . Eosinophils Relative 11/23/2017 3  % Final  . Eosinophils Absolute 11/23/2017 0.4  0.0 - 0.5 K/uL Final  . Basophils Relative 11/23/2017 0  % Final  . Basophils Absolute 11/23/2017 0.0  0.0 - 0.1 K/uL Final   Performed at [facility], [place] [address], [place],  [address]    (this displays the last labs from the last 3 days)  Lab Results  Component Value Date   TOTALPROTELP 6.7 02/28/2014  ALBUMINELP 57.4 02/28/2014   A1GS 4.9 02/28/2014   A2GS 8.5 02/28/2014   BETS 7.5 (H) 02/28/2014   BETA2SER 5.3 02/28/2014   GAMS 16.4 02/28/2014   MSPIKE 0.31 02/28/2014   SPEI SEE NOTE 02/28/2014   (this displays SPEP labs)  No results found for: KPAFRELGTCHN, LAMBDASER, KAPLAMBRATIO (kappa/lambda light chains)  No results found for: HGBA, HGBA2QUANT, HGBFQUANT, HGBSQUAN (Hemoglobinopathy evaluation)   No results found for: LDH  No results found for: IRON, TIBC, IRONPCTSAT (Iron and TIBC)  No results found for: FERRITIN  Urinalysis    Component Value Date/Time   COLORURINE YELLOW 01/18/2017 [place] 01/18/2017 1605   LABSPEC 1.015 01/18/2017 1605   PHURINE 7.0 01/18/2017 1605   GLUCOSEU NEGATIVE 01/18/2017 1605   HGBUR NEGATIVE 01/18/2017 1605   BILIRUBINUR NEGATIVE 01/18/2017 1605   KETONESUR NEGATIVE 01/18/2017 1605   PROTEINUR 30 (A) 04/18/2011 1942   UROBILINOGEN 0.2 01/18/2017 1605   NITRITE NEGATIVE 01/18/2017 1605   LEUKOCYTESUR NEGATIVE 01/18/2017 1605     STUDIES: Ct Chest Lung Ca Screen Low Dose W/o Cm  Result Date: 10/28/2017 CLINICAL DATA:  72 year old female former smoker (quit in 2012) with 50 pack-year history of smoking. Lung  cancer screening examination. EXAM: CT CHEST WITHOUT CONTRAST LOW-DOSE FOR LUNG CANCER SCREENING TECHNIQUE: Multidetector CT imaging of the chest was performed following the standard protocol without IV contrast. COMPARISON:  Chest CT 10/12/2016. FINDINGS: Cardiovascular: Heart size is normal. There is no significant pericardial fluid, thickening or pericardial calcification. There is aortic atherosclerosis, as well as atherosclerosis of the great vessels of the mediastinum and the coronary arteries, including calcified atherosclerotic plaque in the left main, left anterior descending, left circumflex and right coronary arteries. Mediastinum/Nodes: No pathologically enlarged mediastinal or hilar lymph nodes. Please note that accurate exclusion of hilar adenopathy is limited on noncontrast CT scans. Esophagus is unremarkable in appearance. No axillary lymphadenopathy. Lungs/Pleura: Multiple small pulmonary nodules are noted throughout both lungs, similar to the prior study. The largest of these is in the anterior aspect of the left upper lobe (axial image 92 of series 3), with a volume derived mean diameter of only 3 mm. No larger more suspicious appearing pulmonary nodules or masses are noted. No acute consolidative airspace disease. No pleural effusions. Diffuse bronchial wall thickening with severe centrilobular and paraseptal emphysema. Upper Abdomen: 2.3 x 1.3 cm right adrenal nodule is intermediate attenuation (15 HU), considered indeterminate. Two left adrenal lesions are both low-attenuation compatible with adenomas, including a 3.6 x 3.2 cm mass and 2.9 x 2.4 cm nodule on axial images 56 and 59 of series 2 respectively. Musculoskeletal: There are no aggressive appearing lytic or blastic lesions noted in the visualized portions of the skeleton. Multiple old healed right-sided anterior rib fractures. IMPRESSION: 1. Lung-RADS 2S, benign appearance or behavior. Continue annual screening with low-dose chest CT  without contrast in 12 months. 2. The "S" modifier above refers to potentially clinically significant non lung cancer related findings. Specifically, Aortic atherosclerosis, in addition to left main and 3 vessel coronary artery disease. Assessment for potential risk factor modification, dietary therapy or pharmacologic therapy may be warranted, if clinically indicated. 3. Mild diffuse bronchial wall thickening with severe centrilobular and paraseptal emphysema; imaging findings suggestive of underlying COPD. 4. 2 large left adrenal lesions are stable compared to prior examinations and compatible with benign adenomas. Indeterminate right adrenal lesion is also stable compared to prior studies, likely to represent a benign lesion such as a lipid poor adenoma. Aortic  Atherosclerosis (ICD10-I70.0) and Emphysema (ICD10-J43.9). Electronically Signed   By: Vinnie Langton M.D.   On: 10/28/2017 13:39    ELIGIBLE FOR AVAILABLE RESEARCH PROTOCOL: no  ASSESSMENT: 72 y.o. [place] woman status post left breast upper outer quadrant biopsy 06/24/2017 for a clinical T1b N0, stage IA invasive ductal carcinoma, grade 1, estrogen and progesterone receptor positive, HER-2 not amplified, with an MIB-105%  (1) status post left lumpectomy without sentinel lymph node sampling on 08/10/2017 showed invasive ductal carcinoma grade II, spanning 1.2 cm. High grade DCIS. Anterior margin was was focally positive for invasive ductal carcinoma. All other margins clear.   (a) additional surgery for margin clearance 08/31/2017 showed atypical ductal hyperplasia, negative margins  (2) given the patient's age, the tumor size, grade and prognostic profile, will forego Oncotype and avoid chemotherapy  (3) may forgo adjuvant radiation if comfortable with anti-estrogen therapy  (4) anastrozole started 08/30/2017  (5) osteoporosis: bone density on 02/27/2014 showed a T score of -2.9   (a) bone density on 12/08/2016 showed a T score of  -2.4 (osteopenia).  (6) anastrozole to start 09/14/2017  (7) incidentally noted adrenal adenomas stable on most recent scan (10/28/2017).  (8) lung cancer screening:  (a) low-dose CT of the chest 10/28/2017 was benign.  There was significant aortic atherosclerosis and left main coronary artery disease, severe emphysema, and likely benign adrenal lesions   PLAN: Victoria [name] is tolerating anastrozole well and the plan will be to continue that for a total of 5 years.  Her main issue of course is the lungs and this is limiting to her.  She is followed by Dr. Chase Caller for this.  Her recent CT scan shows no evidence of lung cancer.  It does show or suggest a left main disease and triple-vessel coronary artery disease.  I think this requires some evaluation.  The patient remembers seeing Dr. Ellouise Newer about 2 decades ago.  I have sent him a note asking him if he thinks it is appropriate for him to evaluate her for this, or if he she should see a different cardiologist at this point.  From a breast cancer point of view she will see [place] again in 6 months  She knows to call for any problems that may develop before that visit.  Kendan Cornforth, Virgie Dad, MD  11/23/17 12:01 PM Medical Oncology and Hematology [facility] [address] [place],  [address] Tel. [phone]    Fax. [phone]    I, Soijett Blue am acting as scribe for Dr. Sarajane Jews C. Harper Smoker.  I, Lurline Del MD, have reviewed the above documentation for accuracy and completeness, and I agree with the above.

## 2017-11-23 ENCOUNTER — Inpatient Hospital Stay: Payer: Medicare Other

## 2017-11-23 ENCOUNTER — Inpatient Hospital Stay: Payer: Medicare Other | Attending: Oncology | Admitting: Oncology

## 2017-11-23 ENCOUNTER — Telehealth: Payer: Self-pay | Admitting: Oncology

## 2017-11-23 VITALS — BP 167/66 | HR 90 | Temp 98.1°F | Resp 18 | Ht 64.0 in | Wt 208.7 lb

## 2017-11-23 DIAGNOSIS — Z87891 Personal history of nicotine dependence: Secondary | ICD-10-CM | POA: Diagnosis not present

## 2017-11-23 DIAGNOSIS — M810 Age-related osteoporosis without current pathological fracture: Secondary | ICD-10-CM | POA: Diagnosis not present

## 2017-11-23 DIAGNOSIS — J438 Other emphysema: Secondary | ICD-10-CM | POA: Insufficient documentation

## 2017-11-23 DIAGNOSIS — Z79811 Long term (current) use of aromatase inhibitors: Secondary | ICD-10-CM | POA: Insufficient documentation

## 2017-11-23 DIAGNOSIS — I2510 Atherosclerotic heart disease of native coronary artery without angina pectoris: Secondary | ICD-10-CM | POA: Diagnosis not present

## 2017-11-23 DIAGNOSIS — J432 Centrilobular emphysema: Secondary | ICD-10-CM | POA: Insufficient documentation

## 2017-11-23 DIAGNOSIS — Z170 Estrogen receptor positive status [ER+]: Secondary | ICD-10-CM

## 2017-11-23 DIAGNOSIS — C50412 Malignant neoplasm of upper-outer quadrant of left female breast: Secondary | ICD-10-CM

## 2017-11-23 DIAGNOSIS — D3500 Benign neoplasm of unspecified adrenal gland: Secondary | ICD-10-CM | POA: Diagnosis not present

## 2017-11-23 DIAGNOSIS — I700 Atherosclerosis of aorta: Secondary | ICD-10-CM

## 2017-11-23 LAB — CMP (CANCER CENTER ONLY)
ALT: 9 U/L (ref 0–44)
AST: 12 U/L — ABNORMAL LOW (ref 15–41)
Albumin: 3.8 g/dL (ref 3.5–5.0)
Alkaline Phosphatase: 100 U/L (ref 38–126)
Anion gap: 11 (ref 5–15)
BUN: 17 mg/dL (ref 8–23)
CALCIUM: 10 mg/dL (ref 8.9–10.3)
CO2: 31 mmol/L (ref 22–32)
CREATININE: 0.98 mg/dL (ref 0.44–1.00)
Chloride: 97 mmol/L — ABNORMAL LOW (ref 98–111)
GFR, EST NON AFRICAN AMERICAN: 56 mL/min — AB (ref >60)
Glucose, Bld: 133 mg/dL — ABNORMAL HIGH (ref 70–99)
Potassium: 3.3 mmol/L — ABNORMAL LOW (ref 3.5–5.1)
Sodium: 139 mmol/L (ref 135–145)
TOTAL PROTEIN: 7.2 g/dL (ref 6.5–8.1)
Total Bilirubin: 0.4 mg/dL (ref 0.3–1.2)

## 2017-11-23 LAB — CBC WITH DIFFERENTIAL (CANCER CENTER ONLY)
BASOS ABS: 0 10*3/uL (ref 0.0–0.1)
Basophils Relative: 0 %
EOS ABS: 0.4 10*3/uL (ref 0.0–0.5)
EOS PCT: 3 %
HCT: 38.6 % (ref 34.8–46.6)
Hemoglobin: 12.8 g/dL (ref 11.6–15.9)
Lymphocytes Relative: 26 %
Lymphs Abs: 2.7 10*3/uL (ref 0.9–3.3)
MCH: 28.8 pg (ref 25.1–34.0)
MCHC: 33.2 g/dL (ref 31.5–36.0)
MCV: 86.7 fL (ref 79.5–101.0)
Monocytes Absolute: 0.7 10*3/uL (ref 0.1–0.9)
Monocytes Relative: 7 %
Neutro Abs: 6.7 10*3/uL — ABNORMAL HIGH (ref 1.5–6.5)
Neutrophils Relative %: 64 %
PLATELETS: 223 10*3/uL (ref 145–400)
RBC: 4.45 MIL/uL (ref 3.70–5.45)
RDW: 13.4 % (ref 11.2–14.5)
WBC: 10.6 10*3/uL — AB (ref 3.9–10.3)

## 2017-11-23 NOTE — Telephone Encounter (Signed)
Gave patient avs and calendar of upcoming appts.  °

## 2017-12-11 DIAGNOSIS — J449 Chronic obstructive pulmonary disease, unspecified: Secondary | ICD-10-CM | POA: Diagnosis not present

## 2017-12-16 ENCOUNTER — Telehealth: Payer: Self-pay | Admitting: Internal Medicine

## 2017-12-16 MED ORDER — ROFLUMILAST 250 MCG PO TABS: 500.0000 ug | ORAL_TABLET | Freq: Every day | ORAL | 2 refills | Status: DC

## 2017-12-16 NOTE — Telephone Encounter (Signed)
lmtcb

## 2017-12-16 NOTE — Telephone Encounter (Signed)
Spoke with pt  She states she just realized that she was taking too much daliresp 500 mcg  She was confused bc when we gave the 250 mg samples she was told to take 2 daily  Then when she got rx filled for 500 mcg tabs she assumed this was supposed to be taken 2 daily as well  She now can not get rx filled for another 2 wks b/c her insurance will not approve it  I advised will leave more samples of the 250 and she should take 2 daily but when she gets the 500 mcg filled she should only take once per day  She verbalized understanding of this  Samples up front

## 2017-12-20 DIAGNOSIS — G4733 Obstructive sleep apnea (adult) (pediatric): Secondary | ICD-10-CM | POA: Diagnosis not present

## 2017-12-23 DIAGNOSIS — G4733 Obstructive sleep apnea (adult) (pediatric): Secondary | ICD-10-CM | POA: Diagnosis not present

## 2017-12-28 DIAGNOSIS — J449 Chronic obstructive pulmonary disease, unspecified: Secondary | ICD-10-CM | POA: Diagnosis not present

## 2017-12-28 DIAGNOSIS — J45909 Unspecified asthma, uncomplicated: Secondary | ICD-10-CM | POA: Diagnosis not present

## 2018-01-10 ENCOUNTER — Encounter: Payer: Self-pay | Admitting: Internal Medicine

## 2018-01-10 ENCOUNTER — Ambulatory Visit (INDEPENDENT_AMBULATORY_CARE_PROVIDER_SITE_OTHER): Payer: Medicare Other | Admitting: Internal Medicine

## 2018-01-10 ENCOUNTER — Other Ambulatory Visit (INDEPENDENT_AMBULATORY_CARE_PROVIDER_SITE_OTHER): Payer: Medicare Other

## 2018-01-10 VITALS — BP 150/80 | HR 102 | Ht 64.0 in | Wt 201.6 lb

## 2018-01-10 DIAGNOSIS — E042 Nontoxic multinodular goiter: Secondary | ICD-10-CM

## 2018-01-10 DIAGNOSIS — R7989 Other specified abnormal findings of blood chemistry: Secondary | ICD-10-CM

## 2018-01-10 LAB — T3, FREE: T3, Free: 4.2 pg/mL (ref 2.3–4.2)

## 2018-01-10 LAB — TSH: TSH: 0.18 u[IU]/mL — AB (ref 0.35–4.50)

## 2018-01-10 LAB — T4, FREE: FREE T4: 0.87 ng/dL (ref 0.60–1.60)

## 2018-01-10 NOTE — Progress Notes (Signed)
Patient ID: Victoria [name], female   DOB: 1945-08-03, 72 y.o.   MRN: [id number]    HPI  Victoria [name] is a 72 y.o.-year-old female, initially referred by Dr. Harlow [name], returning for follow-up for thyrotoxicosis and MNG.  Last visit 4 months ago.  Reviewed and addended history: She was dx'ed with thyroid nodules ~2010-2011 - by Dr. Radene [name].  She was referred to Dr. Harlow [name] for thyroidectomy, but she refused as she was concerned about vocal cord damage from the Sx.  Dr. Harlow [name] checked her TFTs: TSH suppressed. He referred her to endocrinology for further management and possible nonsurgical tx.   We rechecked her TFTs at last visit and they were improved, but she still had subclinical mild thyrotoxicosis.  At that time, I referred her for a thyroid uptake and scan and also thyroid nodule biopsies.  She did have 2 biopsies performed, but not the uptake and scan.  Reviewed patient's TFTs: Lab Results  Component Value Date   TSH 0.14 (L) 09/01/2017   TSH 0.48 01/13/2017   TSH 0.47 10/14/2015   TSH 1.20 05/13/2015   TSH 0.69 02/11/2014   FREET4 0.71 09/01/2017   FREET4 0.83 10/14/2015   T3FREE 3.2 09/01/2017  08/17/2017: TSH 0.064  Antithyroid antibodies were not elevated: Lab Results  Component Value Date   TSI <89 09/01/2017    Thyroid U/S (07/29/2017):  COMPARISON:  03/23/2016  FINDINGS: Parenchymal Echotexture: Moderately heterogenous Isthmus: 1.4 cm, previously 0.8 cm Right lobe: 4.2 x 3.3 x 2.7 cm, previously 6.5 x 3.0 x 3.6 cm Left lobe: 5.2 x 2.9 x 2.9 cm, previously 6.1 x 2.9 x 2.9 cm _________________________________________________________  Estimated total number of nodules >/= 1 cm: 6-10 _________________________________________________________  Nodule # 2: Prior biopsy: No Location: Isthmus; Mid Maximum size: 1.6 cm; Other 2 dimensions: 1.5 x 0.9 cm, previously,1.4 x 1.4 x 0.9 cm Composition: solid/almost completely solid (2) Echogenicity: hypoechoic  (2) **Given size (>/= 1.5 cm) and appearance, fine needle aspiration of this moderately suspicious nodule should be considered based on TI-RADS criteria. ___________________________________________________  Nodule # 3: Prior biopsy: No Location: Right; Superior Maximum size: 1.1 cm; Other 2 dimensions: 1.0 x 1.0 cm, previously, 1.2 x 1.1 cm Composition: solid/almost completely solid (2) Echogenicity: hypoechoic (2) *Given size (>/= 1 - 1.4 cm) and appearance, a follow-up ultrasound in 1 year should be considered based on TI-RADS criteria. _________________________________________________________  Nodule # 4: Location: Right; Superior Maximum size: 1.5 cm; Other 2 dimensions: 1.0 x 1.0 cm Composition: solid/almost completely solid (2) Echogenicity: hypoechoic (2) **Given size (>/= 1.5 cm) and appearance,fine needle aspiration of this moderately suspicious nodule should be considered based on TI-RADS criteria. ________________________________________________________  Nodule # 5: Location: Right; Mid Maximum size: 1.8 cm; Other 2 dimensions: 1.4 x 1.4 cm Composition: solid/almost completely solid (2) Echogenicity: hypoechoic (2) S**Given size (>/= 1.5 cm) and appearance, fine needle aspiration of this moderately suspicious nodule should be considered based on TI-RADS criteria. ____________________________________________________  Nodule # 7: Location: Right; Inferior Maximum size: 1.6 cm; Other 2 dimensions: 1.6 x 0.8 cm Composition: solid/almost completely solid (2) Echogenicity: hypoechoic (2) **Given size (>/= 1.5 cm) and appearance,fine needle aspiration of this moderately suspicious nodule should be considered based on TI-RADS criteria. ___________________________________________________  Nodule # 8: Location: Left; Superior Maximum size: 1.7 cm; Other 2 dimensions: 1.2 x 1.2 cm Composition: solid/almost completely solid (2) Echogenicity: isoechoic  (1) *Given size (>/= 1.5 - 2.4 cm) and appearance,  a follow-up ultrasound in 1 year should be considered  based on TI-RADS criteria. _________________________________________________________  Nodule # 9: Location: Left; Mid Maximum size: 1.6 cm; Other 2 dimensions: 1.5 x 0.8 cm Composition: solid/almost completely solid (2) Echogenicity: isoechoic (1) *Given size (>/= 1.5 - 2.4 cm) and appearance,  a follow-up ultrasound in 1 year should be considered based on TI-RADS criteria. _________________________________________________________  Nodule # 11: Location: Left; Inferior Maximum size: 1.4 cm; Other 2 dimensions: 1.2 x 1.0 cm Composition: solid/almost completely solid (2) Echogenicity: hypoechoic (2) *Given size (>/= 1 - 1.4 cm) and appearance, a follow-up ultrasound in 1 year should be considered based on TI-RADS criteria. ______________________________________________________  Right lower pole nodule 6 measures 1.9 x 1.6 x 1.6 cm and previously measured 2.0 x 1.7 x 1.1 cm. Biopsy was performed previously.  Left mid nodule measures 2.4 x 2.2 x 1.6 cm and previously measured 1.9 x 1.5 x 1.2 cm. Biopsy was performed previously.  Other nodules do not meet criteria for biopsy nor follow-up.  Bilateral dominant nodules are not significantly changed and were previously biopsied.  Final aspiration biopsy is recommended for nodules 5 and 7.    However, she only had 2 biopsies performed 09/28/2017:  Right 1.6 x 1.6 x 0.8 cm: Benign (nodule #5 above)  Right 1.8 x 1.4 x 1.4 cm: Benign (nodule #7 above)  Pt denies: - weight loss - heat intolerance - tremors - palpitations - anxiety - hyperdefecation - hair loss  Pt denies: - feeling nodules in neck - hoarseness - dysphagia - choking - SOB with lying down  Pt does not have a FH of thyroid ds. No FH of thyroid cancer. No h/o radiation tx to head or neck.  No seaweed or kelp. No recent contrast studies. No  herbal supplements. No Biotin use. No recent steroids use.   Pt. also has a history of BrCA - recent Sx 07/2017. No ChTx and RxTx needed. Also has COPD - on occas. Prednisone.  ROS: Constitutional: + weight loss, no fatigue, no subjective hyperthermia, no subjective hypothermia Eyes: no blurry vision, no xerophthalmia ENT: no sore throat, + see HPI Cardiovascular: no CP/+ SOB/no palpitations/no leg swelling Respiratory: no cough/+ SOB/no wheezing Gastrointestinal: no N/no V/no D/no C/no acid reflux Musculoskeletal: no muscle aches/+ joint aches (knees) Skin: no rashes, no hair loss Neurological: no tremors/no numbness/no tingling/no dizziness  I reviewed pt's medications, allergies, PMH, social hx, family hx, and changes were documented in the history of present illness. Otherwise, unchanged from my initial visit note.  She changed her inhaler since last visit.  Since last visit, she had a left breast lumpectomy (no cancer).  Past Medical History:  Diagnosis Date  . Abdominal aneurysm ([place])    Dr. Oneida Alar follows lLOV 2 ''17 per pt "around 2 cm"  . Anemia    as a child  . Arthritis    left ankle, right [name], right SI joint, wrists, lower back  . Breast cancer in female [facility])    Left  . COPD (chronic obstructive pulmonary disease) ([place])    ephysema-Dr. Chase Caller  . Dyspnea   . Headache    as a child would have terrible headaches during season changes  . 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 Hyperlipidemia   . Hypertension   . Multiple thyroid nodules   . Murmur, cardiac 1950  . Osteoporosis   . Pneumonia   . Pre-diabetes   . Requires continuous at home supplemental oxygen    2 L 24/7  . Sebaceous cyst  hairline sebaceous cyst left posterior neck to be excised 04-13-16 by Dr. Harlow [name] in [place] [facility]  . Sleep apnea    cpap used sometimes, uses oxygen concentrator 2 l/m nasally bedtime  . Varicella as child   Past Surgical History:  Procedure Laterality Date   . APPENDECTOMY     2008  . BREAST LUMPECTOMY WITH RADIOACTIVE SEED LOCALIZATION Left 08/10/2017   Procedure: BREAST LUMPECTOMY WITH RADIOACTIVE SEED LOCALIZATION;  Surgeon: Rolm Bookbinder, MD;  Location: [place];  Service: General;  Laterality: Left;  . [place]  . COLONOSCOPY    . COLONOSCOPY WITH PROPOFOL N/A 04/16/2016   Procedure: COLONOSCOPY WITH PROPOFOL;  Surgeon: Carol Ada, MD;  Location: [facility];  Service: Endoscopy;  Laterality: N/A;  . CYST REMOVAL NECK Left 04/13/2016   Procedure: EXCISION OF SEBACEOUS CYST LEFT POSTERIOR NECK;  Surgeon: Armandina Gemma, MD;  Location: [facility];  Service: General;  Laterality: Left;  . Excision of Pelvic Absess, Right Ovary     2008  . RE-EXCISION OF BREAST CANCER,SUPERIOR MARGINS Left 08/31/2017   Procedure: RE-EXCISION OF LEFT  BREAST MARGINS ERAS PATHWAY;  Surgeon: Rolm Bookbinder, MD;  Location: [place] [place];  Service: General;  Laterality: Left;  . TUBAL LIGATION  1980   Social History   Socioeconomic History  . Marital status: Divorced    Spouse name: Not on file  . Number of children: 1  . Years of education: 47  . Highest education level: Not on file  Occupational History  . Occupation: Social worker, Technical sales engineer: [employer]  Social Needs  . Financial resource strain: Somewhat hard  . Food insecurity:    Worry: Sometimes true    Inability: Sometimes true  . Transportation needs:    Medical: No    Non-medical: No  Tobacco Use  . Smoking status: Former Smoker    Packs/day: 1.00    Years: 50.00    Pack years: 50.00    Types: Cigarettes    Last attempt to quit: 04/16/2011    Years since quitting: 6.7  . Smokeless tobacco: Never Used  . Tobacco comment: quit that date when she had to go to ER   Substance and Sexual Activity  . Alcohol use: Yes    Alcohol/week: 0.0 standard drinks    Comment: rare occasion  . Drug use: No    Comment: no marijuana since 2012  . Sexual activity: Never   Lifestyle  . Physical activity:    Days per week: 3 days    Minutes per session: 30 min  . Stress: Only a little  Relationships  . Social connections:    Talks on phone: More than three times a week    Gets together: More than three times a week    Attends religious service: More than 4 times per year    Active member of club or organization: Not on file    Attends meetings of clubs or organizations: More than 4 times per year    Relationship status: Divorced  . Intimate partner violence:    Fear of current or ex partner: Not on file    Emotionally abused: Not on file    Physically abused: Not on file    Forced sexual activity: Not on file  Other Topics Concern  . Not on file  Social History Narrative   HSG; [name], [place]. . Married '69 - 9 yrs/divorced. 1 son - '72; no grandchildren.  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 No h/o physical or sexual abuse. ACP - no living will - wants information. Provided packet of information. On 08/05/2011: she stated she was distant cousins to celebrities [name] and [name]      Current Outpatient Medications on File Prior to Visit  Medication Sig Dispense Refill  . acetaminophen (TYLENOL) 500 MG tablet Take 1,000 mg by mouth every 6 (six) hours as needed.    . Albuterol Sulfate (PROAIR RESPICLICK) 619 (90 Base) MCG/ACT AEPB Inhale 1-2 puffs into the lungs every 6 (six) hours as needed. (Patient taking differently: Inhale 1-2 puffs into the lungs every 6 (six) hours as needed (for wheezing/shortness of breath). ) 1 each 5  . amLODipine (NORVASC) 10 MG tablet TAKE 1 TABLET BY MOUTH ONCE DAILY 90 tablet 0  . anastrozole (ARIMIDEX) 1 MG tablet Take 1 tablet (1 mg total) by mouth daily. 90 tablet 4  . arformoterol (BROVANA) 15 MCG/2ML NEBU Take 2 mLs (15 mcg total) by nebulization 2 (two) times daily. 180 mL 2  . atorvastatin (LIPITOR) 40 MG tablet TAKE 1 TABLET BY MOUTH ONCE  DAILY IN THE MORNING (Patient not taking: Reported on 10/26/2017) 90 tablet 0  . budesonide (PULMICORT) 0.5 MG/2ML nebulizer solution Take 2 mLs (0.5 mg total) by nebulization 2 (two) times daily. J44.9 120 mL 12  . Cholecalciferol (VITAMIN D3) 2000 units TABS Take 4,000 Units by mouth daily.    [name] denosumab (PROLIA) 60 MG/ML SOLN injection Inject 60 mg into the skin every 6 (six) months. Administer in upper arm, thigh, or abdomen    . hydrochlorothiazide (HYDRODIURIL) 25 MG tablet Take 1 tablet (25 mg total) by mouth daily. 90 tablet 3  . ipratropium-albuterol (DUONEB) 0.5-2.5 (3) MG/3ML SOLN USE ONE VIAL IN NEBULIZER 4 TIMES DAILY (Patient not taking: Reported on 10/26/2017) 360 mL 5  . OXYGEN Inhale 2-3 L into the lungs continuous. When exerting self     . potassium chloride SA (K-DUR,KLOR-CON) 20 MEQ tablet Take 1 tablet (20 mEq total) by mouth 2 (two) times daily. (Patient taking differently: Take 20 mEq by mouth 2 (two) times a week. ) 180 tablet 1  . predniSONE (DELTASONE) 10 MG tablet Take 1 tablet (10 mg total) by mouth daily with breakfast. (Patient not taking: Reported on 10/26/2017) 20 tablet 0  . Revefenacin (YUPELRI) 175 MCG/3ML SOLN Inhale 1 vial into the lungs daily. j44.9 90 mL 5  . Roflumilast (DALIRESP) 250 MCG TABS Take 500 mcg by mouth daily. 28 tablet 2  . roflumilast (DALIRESP) 500 MCG TABS tablet Take 1 tablet (500 mcg total) by mouth daily. 30 tablet 11  . sodium chloride (OCEAN) 0.65 % SOLN nasal spray Place 1 spray into both nostrils as needed (dryness).    [name] tiZANidine (ZANAFLEX) 4 MG tablet Take 1 tablet (4 mg total) by mouth every 8 (eight) hours as needed for muscle spasms. 60 tablet 5  . traMADol (ULTRAM) 50 MG tablet Take 1 tablet (50 mg total) by mouth every 6 (six) hours as needed. 60 tablet 0   No current facility-administered medications on file prior to visit.    Allergies  Allergen Reactions  . Benicar [Olmesartan] Swelling    Swelling of face and arms   .  [name] [Valsartan] Swelling    Swelling of face and arms   . Lisinopril Cough  . Monosodium Glutamate Other (See Comments)    Facial swelling per pt  . Codeine Other (See Comments)  jittery  . Hydrocodone-Acetaminophen Nausea And Vomiting  . Lead Acetate Rash  . Nickel Rash    Severe rash to infection: pt is allergic to all metals other than sterling silver or gold jewelry.    Family History  Problem Relation Age of Onset  . Heart disease Mother        MI - fatal  . Hypertension Mother   . Stroke Father 81       fatal  . Alzheimer's disease Father   . Alzheimer's disease Brother   . Hyperlipidemia Brother   . Hypertension Brother   . Diabetes Brother   . Hypertension Brother   . Hyperlipidemia Brother   . Breast cancer Paternal Grandmother     PE: BP (!) 150/80   Pulse (!) 102   Ht '5\' 4"'  (1.626 m)   Wt 201 lb 9.6 oz (91.4 kg)   SpO2 96% Comment: with o2  BMI 34.60 kg/m  Wt Readings from Last 3 Encounters:  01/10/18 201 lb 9.6 oz (91.4 kg)  11/23/17 208 lb 11.2 oz (94.7 kg)  10/26/17 210 lb (95.3 kg)   Constitutional: overweight, in NAD, + on oxygen Eyes: PERRLA, EOMI, no exophthalmos, no lid lag, no stare ENT: moist mucous membranes, no thyromegaly, no cervical lymphadenopathy Cardiovascular: tachycardia, RR, No MRG Respiratory: CTA B Gastrointestinal: abdomen soft, NT, ND, BS+ Musculoskeletal: no deformities, strength intact in all 4 Skin: moist, warm, no rashes Neurological: no tremor with outstretched hands, DTR normal in all 4  ASSESSMENT: 1. Thyrotoxicosis  2. MNG  PLAN:  1. And 2.  Patient with a relatively recently found TSH, without clear thyrotoxic symptoms: No weight loss, heat intolerance, hyper defecation, palpitations, anxiety.  She continues to have a high heart rate, though, and she tells me that this is usually the case when she uses her inhalers.  - At last visit, we recheck her TFTs and her TSH has improved, but it was still low.  At that  point, I suggested a thyroid uptake and scan.  She did not have this yet.  Of note, TSI antibodies were  not elevated - I also sug at last visitgested to have 4 biopsies of the nodules marked above (see HPI).  She only had 2 performed, both in the right lobe and these were both benign.  I am not sure why the other 2 nodules were not biopsied... - At this visit, we discussed again about the fact that some of these nodules may be overactive and may be the cause for her suppressed TSH.  I explained what the thyroid uptake and scan entails and also want to use it for.  I also explained that if a nodule appears to be overactive, the chances of it being cancerous are really low. - We decided to proceed with the thyroid uptake and scan now and correlate with the results of her ultrasound. - No neck compression symptoms - We will check a TSH, free T4, free T3 today. - At last visit, and again today, we discussed about possible modalities of treatment for the above conditions, to include methimazole use, radioactive iodine ablation or (last resort) surgery.  She would really like to avoid surgery - RTC in 6 months, but likely sooner for repeat labs  - time spent with the patient: 25 minutes, of which >50% was spent in obtaining information about her symptoms, reviewing her previous labs, evaluations, and treatments, counseling her about her condition (please see the discussed topics above), and developing a  plan to further investigate and treat it; she had a number of questions which I addressed.  Component     Latest Ref Rng & Units 01/10/2018  TSH     0.35 - 4.50 uIU/mL 0.18 (L)  T4,Free(Direct)     0.60 - 1.60 ng/dL 0.87  Triiodothyronine,Free,Serum     2.3 - 4.2 pg/mL 4.2  We will go ahead with a thyroid uptake and scan as discussed, since she continues to have subclinical thyrotoxicosis. Philemon Kingdom, MD PhD [facility]

## 2018-01-10 NOTE — Patient Instructions (Signed)
Please stop at the lab.  Please come back for a follow-up appointment in 6 months.  

## 2018-01-11 DIAGNOSIS — J449 Chronic obstructive pulmonary disease, unspecified: Secondary | ICD-10-CM | POA: Diagnosis not present

## 2018-01-17 ENCOUNTER — Other Ambulatory Visit: Payer: Self-pay | Admitting: Internal Medicine

## 2018-01-17 DIAGNOSIS — I10 Essential (primary) hypertension: Secondary | ICD-10-CM

## 2018-01-17 DIAGNOSIS — E785 Hyperlipidemia, unspecified: Secondary | ICD-10-CM

## 2018-01-17 DIAGNOSIS — E118 Type 2 diabetes mellitus with unspecified complications: Secondary | ICD-10-CM

## 2018-01-23 ENCOUNTER — Telehealth: Payer: Self-pay | Admitting: Internal Medicine

## 2018-01-23 DIAGNOSIS — G4733 Obstructive sleep apnea (adult) (pediatric): Secondary | ICD-10-CM | POA: Diagnosis not present

## 2018-01-23 DIAGNOSIS — J45909 Unspecified asthma, uncomplicated: Secondary | ICD-10-CM | POA: Diagnosis not present

## 2018-01-23 DIAGNOSIS — J449 Chronic obstructive pulmonary disease, unspecified: Secondary | ICD-10-CM | POA: Diagnosis not present

## 2018-01-23 MED ORDER — PREDNISONE 10 MG PO TABS: ORAL_TABLET | ORAL | 0 refills | Status: DC

## 2018-01-23 NOTE — Telephone Encounter (Signed)
Spoke with pt, she states she is still SOB. She is using Brovana and daliresp and the albuterol rescue inhaler does help longer but she is still having trouble. She does not like to take prednisone but feels she may need a RX for it to get her out of this flare. MR please advise on next steps for pt.     [facility]  Current Outpatient Medications on File Prior to Visit  Medication Sig Dispense Refill  . acetaminophen (TYLENOL) 500 MG tablet Take 1,000 mg by mouth every 6 (six) hours as needed.    . Albuterol Sulfate (PROAIR RESPICLICK) 026 (90 Base) MCG/ACT AEPB Inhale 1-2 puffs into the lungs every 6 (six) hours as needed. (Patient taking differently: Inhale 1-2 puffs into the lungs every 6 (six) hours as needed (for wheezing/shortness of breath). ) 1 each 5  . amLODipine (NORVASC) 10 MG tablet TAKE 1 TABLET BY MOUTH ONCE DAILY 90 tablet 0  . anastrozole (ARIMIDEX) 1 MG tablet Take 1 tablet (1 mg total) by mouth daily. 90 tablet 4  . arformoterol (BROVANA) 15 MCG/2ML NEBU Take 2 mLs (15 mcg total) by nebulization 2 (two) times daily. 180 mL 2  . atorvastatin (LIPITOR) 40 MG tablet TAKE 1 TABLET BY MOUTH ONCE DAILY IN THE MORNING 90 tablet 0  . budesonide (PULMICORT) 0.5 MG/2ML nebulizer solution Take 2 mLs (0.5 mg total) by nebulization 2 (two) times daily. J44.9 120 mL 12  . Cholecalciferol (VITAMIN D3) 2000 units TABS Take 4,000 Units by mouth daily.    [name] denosumab (PROLIA) 60 MG/ML SOLN injection Inject 60 mg into the skin every 6 (six) months. Administer in upper arm, thigh, or abdomen    . hydrochlorothiazide (HYDRODIURIL) 25 MG tablet Take 1 tablet (25 mg total) by mouth daily. 90 tablet 3  . ipratropium-albuterol (DUONEB) 0.5-2.5 (3) MG/3ML SOLN USE ONE VIAL IN NEBULIZER 4 TIMES DAILY 360 mL 5  . OXYGEN Inhale 2-3 L into the lungs continuous. When exerting self     . potassium chloride SA (K-DUR,KLOR-CON) 20 MEQ tablet Take 1 tablet (20 mEq total) by mouth 2 (two) times daily.  (Patient taking differently: Take 20 mEq by mouth 2 (two) times a week. ) 180 tablet 1  . predniSONE (DELTASONE) 10 MG tablet Take 1 tablet (10 mg total) by mouth daily with breakfast. 20 tablet 0  . Revefenacin (YUPELRI) 175 MCG/3ML SOLN Inhale 1 vial into the lungs daily. j44.9 90 mL 5  . Roflumilast (DALIRESP) 250 MCG TABS Take 500 mcg by mouth daily. 28 tablet 2  . roflumilast (DALIRESP) 500 MCG TABS tablet Take 1 tablet (500 mcg total) by mouth daily. 30 tablet 11  . sodium chloride (OCEAN) 0.65 % SOLN nasal spray Place 1 spray into both nostrils as needed (dryness).    [name] tiZANidine (ZANAFLEX) 4 MG tablet Take 1 tablet (4 mg total) by mouth every 8 (eight) hours as needed for muscle spasms. 60 tablet 5  . traMADol (ULTRAM) 50 MG tablet Take 1 tablet (50 mg total) by mouth every 6 (six) hours as needed. 60 tablet 0   No current facility-administered medications on file prior to visit.    Allergies  Allergen Reactions  . Benicar [Olmesartan] Swelling    Swelling of face and arms   . [name] [Valsartan] Swelling    Swelling of face and arms   . Lisinopril Cough  . Monosodium Glutamate Other (See Comments)    Facial swelling per pt  . Codeine Other (See  Comments)    jittery  . Hydrocodone-Acetaminophen Nausea And Vomiting  . Lead Acetate Rash  . Nickel Rash    Severe rash to infection: pt is allergic to all metals other than sterling silver or gold jewelry.

## 2018-01-23 NOTE — Telephone Encounter (Signed)
Ok to take 5 day short course  Please take prednisone 40 mg x1 day, then 30 mg x1 day, then 20 mg x1 day, then 10 mg x1 day, and then 5 mg x1 day and stop  ANd if sputum is colored  Take doxycycline 100mg  po twice daily x 5 days; take after meals and avoid sunlight    Allergies  Allergen Reactions  . Benicar [Olmesartan] Swelling    Swelling of face and arms   . [name] [Valsartan] Swelling    Swelling of face and arms   . Lisinopril Cough  . Monosodium Glutamate Other (See Comments)    Facial swelling per pt  . Codeine Other (See Comments)    jittery  . Hydrocodone-Acetaminophen Nausea And Vomiting  . Lead Acetate Rash  . Nickel Rash    Severe rash to infection: pt is allergic to all metals other than sterling silver or gold jewelry.

## 2018-01-23 NOTE — Telephone Encounter (Signed)
Spoke with pt, advised her of MR recommendations. She denies colored sputum so she did not want the antibiotic. I called the prednisone in to the pharmacy. Pt understood and nothing further is needed.

## 2018-01-26 ENCOUNTER — Telehealth: Payer: Self-pay | Admitting: Internal Medicine

## 2018-01-26 NOTE — Telephone Encounter (Signed)
Called and spoke to pt who states with her being on the prednisone, she wants to know if it would be okay for her to go to the market due to being on the prednisone and with the heat and also with being on O2.  Stated to pt that she is on the maintenance prednisone and also that pt knows her limits with what she can and cannot do.  Pt expressed understanding. Nothing further needed.

## 2018-01-30 ENCOUNTER — Encounter: Payer: Self-pay | Admitting: Internal Medicine

## 2018-01-30 ENCOUNTER — Telehealth: Payer: Self-pay | Admitting: Internal Medicine

## 2018-01-30 NOTE — Telephone Encounter (Signed)
Attempted to call pt. I did not receive an answer. I have left a message for pt to return our call.  

## 2018-01-30 NOTE — Telephone Encounter (Signed)
To whom it may concern  Re: Victoria [name] [birth year]   [name] is a patient of mine. She sufferes from end stage copd with chronic respiratory failure, and sleep apnea. She should NOT be working with this level of disease burden. Recommend approval for a house through [employer].  Sincerely yours    SIGNATURE    Dr. Brand Males, M.D., F.C.C.P,  Pulmonary and Critical Care Medicine Staff Physician, [place] Director - Interstitial Lung Disease  Program  [facility] [place], [place], [address]

## 2018-01-30 NOTE — Telephone Encounter (Signed)
Patient returning call, CB is [phone]

## 2018-01-30 NOTE — Telephone Encounter (Signed)
Printed letter will fax this off today.CAlled patient to let her know.

## 2018-01-30 NOTE — Telephone Encounter (Signed)
I have called the Patient she States that she is trying to get approved for the [employer] to help pay for her house .  But they are needing a letter Stating that it is in Dr. Chase Caller thinks it is best for the patient not to work anymore any retire.So that she will be approve for this program.  Please fax the letter to the [employer]: of  Samuella Bruin  Fax number is [phone].  Dr. Chase Caller please advise thank you.

## 2018-02-02 ENCOUNTER — Telehealth: Payer: Self-pay | Admitting: Internal Medicine

## 2018-02-02 NOTE — Telephone Encounter (Signed)
Copied from [place] [phone]. Topic: Inquiry >> Feb 02, 2018 11:43 AM Reyne Dumas L wrote: Reason for CRM:   Pt calling in regards to Prolia.  Pt states she needs to know if she needs to pick this up from [facility].  Also, pt states she is on a scholarship program and shouldn't have a copay. Pt can be reached at [phone]

## 2018-02-03 NOTE — Telephone Encounter (Signed)
Unfortunately we don't keep patient assistance numbers for prolia in patient's chart---but patient has already used her previous approval numbers for the last 2 injections and will need to re-enroll and get approved for the next 2 injections---patient will call [employer]well to get approval for the next 2 injections and will call tamara at [facility] office back with that authorization---can talk with tamara at [facility] office

## 2018-02-03 NOTE — Telephone Encounter (Signed)
Left message advising patient that healthwell approvals will give patient a set of authorization numbers and also mail those numbers to her in paper form----some pharmacies allow patient to bring those numbers to pharmacy and request prolia, either the patient brings prolia to my office for administration or prolia is mailed to my office for patient to get here---not sure how [facility] will want to do this----patient can try going thru pharmacy with numbers or any other questions, she can call me back at [facility] office

## 2018-02-03 NOTE — Telephone Encounter (Signed)
Patient has been approved thru healthwell for next 2 injections, she is calling [facility] to run [employer]well numbers thru pharmacy side of her benefits, patient to let me know if [facility] is mailing to our office or if patient will be bringing prolia to my office for administration

## 2018-02-03 NOTE — Telephone Encounter (Signed)
Pt calling back to speak with Jonelle Sidle stating that she doesn't have any numbers that she was approved last year for them to pay for the $200 copay she wants to know if the number is somewhere in her chart please call at [phone]

## 2018-02-06 NOTE — Telephone Encounter (Signed)
I have cancelled nurse sched appt for tomorrow 9/24 prolia ----patient will call about 1 hour before coming in with prolia to sched nurse visit---since patient is bringing prolia from pharm---there is estimated $0 copay---it will be billed/payed from pharmacy side of her benefits--ok to give per tamara---patient has been advised to keep prolia injection cold until she arrives with it at [facility] office

## 2018-02-06 NOTE — Telephone Encounter (Signed)
Patient called and states [name] is out of Prolia. She is unsure if they will have it by her appt tomorrow. They told her that it should be in 1 -2 days. Pt is requesting a call back on what to do about her appt. CB# [phone]

## 2018-02-07 ENCOUNTER — Ambulatory Visit: Payer: Medicare Other

## 2018-02-07 ENCOUNTER — Ambulatory Visit: Payer: Medicare Other | Admitting: Internal Medicine

## 2018-02-07 ENCOUNTER — Encounter: Payer: Medicare Other | Admitting: Internal Medicine

## 2018-02-09 ENCOUNTER — Ambulatory Visit (INDEPENDENT_AMBULATORY_CARE_PROVIDER_SITE_OTHER): Payer: Medicare Other

## 2018-02-09 ENCOUNTER — Encounter: Payer: Self-pay | Admitting: Internal Medicine

## 2018-02-09 ENCOUNTER — Ambulatory Visit: Payer: Medicare Other

## 2018-02-09 ENCOUNTER — Ambulatory Visit: Payer: Medicare Other | Admitting: Internal Medicine

## 2018-02-09 VITALS — BP 132/76 | HR 85 | Ht 64.0 in | Wt 206.6 lb

## 2018-02-09 DIAGNOSIS — Z23 Encounter for immunization: Secondary | ICD-10-CM | POA: Diagnosis not present

## 2018-02-09 DIAGNOSIS — R600 Localized edema: Secondary | ICD-10-CM

## 2018-02-09 DIAGNOSIS — J449 Chronic obstructive pulmonary disease, unspecified: Secondary | ICD-10-CM

## 2018-02-09 DIAGNOSIS — J9611 Chronic respiratory failure with hypoxia: Secondary | ICD-10-CM | POA: Diagnosis not present

## 2018-02-09 DIAGNOSIS — M810 Age-related osteoporosis without current pathological fracture: Secondary | ICD-10-CM

## 2018-02-09 MED ORDER — FLUTICASONE-UMECLIDIN-VILANT 100-62.5-25 MCG/INH IN AEPB: 62.5000 ug | INHALATION_SPRAY | Freq: Every day | RESPIRATORY_TRACT | 0 refills | Status: DC

## 2018-02-09 MED ORDER — FUROSEMIDE 20 MG PO TABS: 20.0000 mg | ORAL_TABLET | Freq: Every day | ORAL | 3 refills | Status: DC

## 2018-02-09 MED ORDER — DENOSUMAB 60 MG/ML ~~LOC~~ SOSY
60.0000 mg | PREFILLED_SYRINGE | Freq: Once | SUBCUTANEOUS | Status: AC
  Administered 2018-02-09: 60 mg via SUBCUTANEOUS

## 2018-02-09 MED ORDER — FLUTICASONE-UMECLIDIN-VILANT 100-62.5-25 MCG/INH IN AEPB: 62.5000 ug | INHALATION_SPRAY | Freq: Every day | RESPIRATORY_TRACT | 5 refills | Status: DC

## 2018-02-09 MED ORDER — POTASSIUM CHLORIDE ER 10 MEQ PO TBCR: 10.0000 meq | EXTENDED_RELEASE_TABLET | Freq: Every day | ORAL | 0 refills | Status: DC

## 2018-02-09 NOTE — Progress Notes (Signed)
Patient seen in the office today and instructed on use of Trelegy.  Patient expressed understanding and demonstrated technique. TA/CMA

## 2018-02-09 NOTE — Patient Instructions (Addendum)
COPD, severe ([place]) Chronic respiratory failure with hypoxia (HCC)   - stable disease - continue oxygen 2-3 L [place] at baseline  - start trelegy inhaler once daily - take 4 week sample, and script  - during this time hold offbrovana, pulmicort, duoneb scheduled - ok to do albuterol as needed - high dose flu shot 02/09/2018  Pedal edema - new - due to soda intake and salted chips - stop soda and salted chips - take lasix 20mg  daily with 66meq KCL daily x 3 days - rest of followup with PCP Janith Lima, MD   Followup 8 weeks with APP to report progress with trelegy 6 months with DR Chase Caller

## 2018-02-09 NOTE — Addendum Note (Signed)
Addended by: Jannette Spanner on: 02/09/2018 10:20 AM   Modules accepted: Orders

## 2018-02-09 NOTE — Progress Notes (Signed)
OV 07/20/2017  Chief Complaint  Patient presents with  . Follow-up    Follow up visit. Pt also needing surgical clearance for lumpectomy.  Pt saw RB beginning February annd was givien prednisone. Pt states breathing has been tolerable with not taking prednisone. DME: [employer], 2L continuous at home 3L pulse when out     Follow-up severe COPD patient with obesity and sleep apnea.  She says she is compliant with all her COPD inhalers.  She is also compliant with her CPAP.  She is not smoking anymore.  Overall COPD CAT score is 16.  She uses oxygen all the time and CPAP at night.  She is due for breast surgery lumpectomy and is here for a preoperative evaluation.  She tells me that she is in good nutritional status.  She is not anemic.  She has got good kidney function.  She says the surgery will be elective and less than 2 hours and does not involve operating on the neck or the chest or the abdomen.  It will be done at [facility] and under experience surgeon.  Overall she is very functional and active despite his severe COPD.   OV 09/14/2017  Chief Complaint  Patient presents with  . Follow-up    Pt states she has been doing better since her visit with Dr. Halford Chessman last week. Pt is currently taking an abx and just finished last dose of prednisone today. Pt's cough has become better and is still wheezing some. SOB is about the same.    Follow-up advanced COPD with obesity and sleep apnea and chronic hypoxemic respiratory failure  I last saw her in March 2019 which itself followed in exacerbation.  Since then she has had 2 visits in my office with 2 providers for COPD exacerbation.  Most recently September 09, 2017.  Chest x-ray reviewed but did not visualize and the x-ray is clear.  [Of note she had low-dose CT scan of the chest May 2018 and is due for one-year CT scan coming up anytime now] her baseline COPD CAT scan.  She is using oxygen between 2-3 L.  She feels her exacerbation is slowly  resolving but she is still symptomatic COPD CAT score is 24 and much worse than baseline although this is improved compared to last week.  She did have breast cancer surgery recently and this went well.  Currently breast cancer in remission.  Review of her medication shows that while she was in the hospital nebulized bronchodilator long-acting beta agonist was tried and she really liked it.  Therefore on September 09, 2017 visit her combination inhaler Stiolto was changed to [place] she is here to start this.and Yupelri.  Review of her chart shows that she is not on inhaled corticosteroid.  Sometime back she was on Arnuity but she is not taking it due to lack of perceived benefit.  But now she is having recurrent exacerbations.  Therefore she is open to starting it again     OV 02/09/2018  Subjective:  Patient ID: [name], female , DOB: 12/26/1945 , age 72 y.o. , MRN: [id number] , ADDRESS: [address] [place] [address]   02/09/2018 -   Chief Complaint  Patient presents with  . Follow-up    Pt states she has been okay since last visit. States she is still having problems becoming SOB when she exerts herself and has occ cough when she has the SOB. Denies any complaints of wheezing  or CP. Pt does develop chest tightness when she has problems with SOB.     HPI [name] 72 y.o. -with advanced COPD and chronic hypoxemic respiratory failure.  Presents for routine follow-up.  Few weeks ago she had an exacerbation recall an antibiotic and prednisone.  After that she is improved back to her baseline with COPD CAT score shown below 15 and the symptomatology described.  She has new onset of pedal edema for the last 3 days.  This is associated with increased dietary intake of soda and also salted chips.  There is no fever or rash or wheezing.  Her COPD medications include nebulizers.  She was changed to the sometime back by nurse practitioner.  She is asking about going back to inhaler  particularly Stiolto.  She is willing to try Trelegy     CAT COPD Symptom & Quality of Life Score ([place] trademark) 0 is no burden. 5 is highest burden 07/20/2017  09/14/2017  02/09/2018   Never Cough -> Cough all the time 2 3 2   No phlegm in chest -> Chest is full of phlegm 1 2 2   No chest tightness -> Chest feels very tight 2 3 2   No dyspnea for 1 flight stairs/hill -> Very dyspneic for 1 flight of stairs 4 5 4   No limitations for ADL at home -> Very limited with ADL at home 2 3 2   Confident leaving home -> Not at all confident leaving home 0 3 0  Sleep soundly -> Do not sleep soundly because of lung condition 2 2 1   Lots of Energy -> No energy at all 3 3 2   TOTAL Score (max 40)  16 24 15      Results for ROLENA, KNUTSON (MRN [id number]) as of 07/20/2017 10:59  Ref. Range 04/15/2016 13:02  FEV1-Post Latest Units: L 0.65  FEV1-%Pred-Post Latest Units: % 34  FEV1-%Change-Post Latest Units: % 1   Results for JAHNAVI, MURATORE (MRN [id number]) as of 07/20/2017 10:59  Ref. Range 04/15/2016 13:02  DLCO cor Latest Units: ml/min/mmHg 9.47  DLCO cor % pred Latest Units: % 37    ROS - per HPI     has a past medical history of Abdominal aneurysm ([place]), Anemia, Arthritis, Breast cancer in female [facility]), COPD (chronic obstructive pulmonary disease) ([place]), Dyspnea, Headache, Heart murmur, Hyperlipidemia, Hypertension, Multiple thyroid nodules, Murmur, cardiac (1950), Osteoporosis, Pneumonia, Pre-diabetes, Requires continuous at home supplemental oxygen, Sebaceous cyst, Sleep apnea, and Varicella (as child).   reports that she quit smoking about 6 years ago. Her smoking use included cigarettes. She has a 50.00 pack-year smoking history. She has never used smokeless tobacco.  Past Surgical History:  Procedure Laterality Date  . APPENDECTOMY     2008  . BREAST LUMPECTOMY WITH RADIOACTIVE SEED LOCALIZATION Left 08/10/2017   Procedure: BREAST LUMPECTOMY WITH RADIOACTIVE SEED LOCALIZATION;  Surgeon:  Rolm Bookbinder, MD;  Location: [facility];  Service: General;  Laterality: Left;  . [place]  . COLONOSCOPY    . COLONOSCOPY WITH PROPOFOL N/A 04/16/2016   Procedure: COLONOSCOPY WITH PROPOFOL;  Surgeon: Carol Ada, MD;  Location: [facility];  Service: Endoscopy;  Laterality: N/A;  . CYST REMOVAL NECK Left 04/13/2016   Procedure: EXCISION OF SEBACEOUS CYST LEFT POSTERIOR NECK;  Surgeon: Armandina Gemma, MD;  Location: [facility];  Service: General;  Laterality: Left;  . Excision of Pelvic Absess, Right Ovary     2008  . RE-EXCISION OF BREAST CANCER,SUPERIOR MARGINS  Left 08/31/2017   Procedure: RE-EXCISION OF LEFT  BREAST MARGINS ERAS PATHWAY;  Surgeon: Rolm Bookbinder, MD;  Location: [facility];  Service: General;  Laterality: Left;  . TUBAL LIGATION  1980    Allergies  Allergen Reactions  . Benicar [Olmesartan] Swelling    Swelling of face and arms   . [name] [Valsartan] Swelling    Swelling of face and arms   . Lisinopril Cough  . Monosodium Glutamate Other (See Comments)    Facial swelling per pt  . Codeine Other (See Comments)    jittery  . Hydrocodone-Acetaminophen Nausea And Vomiting  . Lead Acetate Rash  . Nickel Rash    Severe rash to infection: pt is allergic to all metals other than sterling silver or gold jewelry.     Immunization History  Administered Date(s) Administered  . Influenza Split 01/16/2011  . Influenza, High Dose Seasonal PF 01/13/2017  . Influenza,inj,Quad PF,6+ Mos 02/01/2013, 02/27/2014, 04/07/2015, 01/08/2016  . Pneumococcal Conjugate-13 02/01/2013  . Pneumococcal Polysaccharide-23 10/29/2014  . Td 12/06/2011    Family History  Problem Relation Age of Onset  . Heart disease Mother        MI - fatal  . Hypertension Mother   . Stroke Father 11       fatal  . Alzheimer's disease Father   . Alzheimer's disease Brother   . Hyperlipidemia Brother   . Hypertension Brother   . Diabetes Brother   . Hypertension Brother   .  Hyperlipidemia Brother   . Breast cancer Paternal Grandmother      Current Outpatient Medications:  .  acetaminophen (TYLENOL) 500 MG tablet, Take 1,000 mg by mouth every 6 (six) hours as needed., Disp: , Rfl:  .  Albuterol Sulfate (PROAIR RESPICLICK) 607 (90 Base) MCG/ACT AEPB, Inhale 1-2 puffs into the lungs every 6 (six) hours as needed. (Patient taking differently: Inhale 1-2 puffs into the lungs every 6 (six) hours as needed (for wheezing/shortness of breath). ), Disp: 1 each, Rfl: 5 .  amLODipine (NORVASC) 10 MG tablet, TAKE 1 TABLET BY MOUTH ONCE DAILY, Disp: 90 tablet, Rfl: 0 .  anastrozole (ARIMIDEX) 1 MG tablet, Take 1 tablet (1 mg total) by mouth daily., Disp: 90 tablet, Rfl: 4 .  arformoterol (BROVANA) 15 MCG/2ML NEBU, Take 2 mLs (15 mcg total) by nebulization 2 (two) times daily., Disp: 180 mL, Rfl: 2 .  atorvastatin (LIPITOR) 40 MG tablet, TAKE 1 TABLET BY MOUTH ONCE DAILY IN THE MORNING, Disp: 90 tablet, Rfl: 0 .  budesonide (PULMICORT) 0.5 MG/2ML nebulizer solution, Take 2 mLs (0.5 mg total) by nebulization 2 (two) times daily. J44.9, Disp: 120 mL, Rfl: 12 .  Cholecalciferol (VITAMIN D3) 2000 units TABS, Take 4,000 Units by mouth daily., Disp: , Rfl:  .  denosumab (PROLIA) 60 MG/ML SOLN injection, Inject 60 mg into the skin every 6 (six) months. Administer in upper arm, thigh, or abdomen, Disp: , Rfl:  .  hydrochlorothiazide (HYDRODIURIL) 25 MG tablet, Take 1 tablet (25 mg total) by mouth daily., Disp: 90 tablet, Rfl: 3 .  ipratropium-albuterol (DUONEB) 0.5-2.5 (3) MG/3ML SOLN, USE ONE VIAL IN NEBULIZER 4 TIMES DAILY, Disp: 360 mL, Rfl: 5 .  OXYGEN, Inhale 2-3 L into the lungs continuous. When exerting self , Disp: , Rfl:  .  potassium chloride SA (K-DUR,KLOR-CON) 20 MEQ tablet, Take 1 tablet (20 mEq total) by mouth 2 (two) times daily. (Patient taking differently: Take 20 mEq by mouth 2 (two) times a week. ), Disp: 180  tablet, Rfl: 1 .  Revefenacin (YUPELRI) 175 MCG/3ML SOLN,  Inhale 1 vial into the lungs daily. j44.9, Disp: 90 mL, Rfl: 5 .  roflumilast (DALIRESP) 500 MCG TABS tablet, Take 1 tablet (500 mcg total) by mouth daily., Disp: 30 tablet, Rfl: 11 .  sodium chloride (OCEAN) 0.65 % SOLN nasal spray, Place 1 spray into both nostrils as needed (dryness)., Disp: , Rfl:  .  tiZANidine (ZANAFLEX) 4 MG tablet, Take 1 tablet (4 mg total) by mouth every 8 (eight) hours as needed for muscle spasms., Disp: 60 tablet, Rfl: 5      Objective:   Vitals:   02/09/18 0927  BP: 132/76  Pulse: 85  SpO2: 98%  Weight: 206 lb 9.6 oz (93.7 kg)  Height: 5\' 4"  (1.626 m)    Estimated body mass index is 35.46 kg/m as calculated from the following:   Height as of this encounter: 5\' 4"  (1.626 m).   Weight as of this encounter: 206 lb 9.6 oz (93.7 kg).  @WEIGHTCHANGE @  [employer]   02/09/18 0927  Weight: 206 lb 9.6 oz (93.7 kg)     Physical Exam  General Appearance:    Alert, cooperative, no distress, appears stated age - yes , sitting on - chair . OBESE , Deconditioned looking - no  Head:    Normocephalic, without obvious abnormality, atraumatic  Eyes:    PERRL, conjunctiva/corneas clear,  Ears:    Normal TM's and external ear canals, both ears  Nose:   Nares normal, septum midline, mucosa normal, no drainage    or sinus tenderness. OXYGEN ON  - yes . Patient is @ 3LNC   Throat:   Lips, mucosa, and tongue normal; teeth and gums normal. Cyanosis on lips - no  Neck:   Supple, symmetrical, trachea midline, no adenopathy;    thyroid:  no enlargement/tenderness/nodules; no carotid   bruit or JVD  Back:     Symmetric, no curvature, ROM normal, no CVA tenderness  Lungs:     Distress - no , Wheeze no, Barrell Chest - yues, Purse lip breathing - mild yes, Crackles - no   Chest Wall:    No tenderness or deformity.   Heart:    Regular rate and rhythm, S1 and S2 normal, no rub   or gallop, Murmur - no  Breast Exam:    NOT DONE  Abdomen:     Soft, non-tender, bowel sounds  active all four quadrants,    no masses, no organomegaly. Visceral obesity - yes  Genitalia:   NOT DONE  Rectal:   NOT DONE  Extremities:   Extremities normal, atraumatic, Clubbing - no, Edema - yes ++  Pulses:   2+ and symmetric all extremities  Skin:   Stigmata of Connective Tissue Disease - no  Lymph nodes:   Cervical, supraclavicular, and axillary nodes normal  Psychiatric:  Neurologic:   YES - pleasant  CAm-ICU - neg, Alert and Oriented x 3 - yes, Moves all 4s - yes, Speech - normal, Cognition - intact           Assessment:       ICD-10-CM   1. COPD, severe ([place]) J44.9   2. Chronic respiratory failure with hypoxia (HCC) J96.11   3. Pedal edema R60.0        Plan:     Patient Instructions  COPD, severe ([place]) Chronic respiratory failure with hypoxia ([place])   - stable disease - continue oxygen 2-3 L [place] at baseline  -  start trelegy inhaler once daily - take 4 week sample, and script  - during this time hold offbrovana, pulmicort, duoneb scheduled - ok to do albuterol as needed - high dose flu shot deferred 02/09/2018 due to lack of stock - get one asap outside our office  Pedal edema - new - due to soda intake and salted chips - stop soda and salted chips - take lasix 20mg  daily with 57meq KCL daily x 3 days - rest of followup with PCP Janith Lima, MD   Followup 8 weeks with APP to report progress with trelegy 6 months with DR Gaye Alken    Dr. Brand Males, M.D., F.C.C.P,  Pulmonary and Critical Care Medicine Staff Physician, [place] Director - Interstitial Lung Disease  Program  Pulmonary [place] [facility] [place], [place], [address]  Pager: [phone], If no answer or between  15:00h - 7:00h: call [phone] Telephone: [phone]  9:52 AM 02/09/2018

## 2018-02-10 ENCOUNTER — Ambulatory Visit: Payer: Medicare Other

## 2018-02-10 ENCOUNTER — Telehealth: Payer: Self-pay

## 2018-02-10 ENCOUNTER — Telehealth: Payer: Self-pay | Admitting: Internal Medicine

## 2018-02-10 NOTE — Telephone Encounter (Signed)
Spoke with pt reminding of SCP visit with [place] on 02/14/18 at 2 pm.

## 2018-02-10 NOTE — Telephone Encounter (Signed)
Spoke with pt, she would like to make sure that she can take the HCTZ and Lasix since they are both water pills. MR please advise.   Current Outpatient Medications on File Prior to Visit  Medication Sig Dispense Refill  . acetaminophen (TYLENOL) 500 MG tablet Take 1,000 mg by mouth every 6 (six) hours as needed.    . Albuterol Sulfate (PROAIR RESPICLICK) 956 (90 Base) MCG/ACT AEPB Inhale 1-2 puffs into the lungs every 6 (six) hours as needed. (Patient taking differently: Inhale 1-2 puffs into the lungs every 6 (six) hours as needed (for wheezing/shortness of breath). ) 1 each 5  . amLODipine (NORVASC) 10 MG tablet TAKE 1 TABLET BY MOUTH ONCE DAILY 90 tablet 0  . anastrozole (ARIMIDEX) 1 MG tablet Take 1 tablet (1 mg total) by mouth daily. 90 tablet 4  . arformoterol (BROVANA) 15 MCG/2ML NEBU Take 2 mLs (15 mcg total) by nebulization 2 (two) times daily. 180 mL 2  . atorvastatin (LIPITOR) 40 MG tablet TAKE 1 TABLET BY MOUTH ONCE DAILY IN THE MORNING 90 tablet 0  . budesonide (PULMICORT) 0.5 MG/2ML nebulizer solution Take 2 mLs (0.5 mg total) by nebulization 2 (two) times daily. J44.9 120 mL 12  . Cholecalciferol (VITAMIN D3) 2000 units TABS Take 4,000 Units by mouth daily.    [name] denosumab (PROLIA) 60 MG/ML SOLN injection Inject 60 mg into the skin every 6 (six) months. Administer in upper arm, thigh, or abdomen    . Fluticasone-Umeclidin-Vilant (TRELEGY ELLIPTA) 100-62.5-25 MCG/INH AEPB Inhale 62.5 mcg into the lungs daily. Place on hold 1 each 5  . furosemide (LASIX) 20 MG tablet Take 1 tablet (20 mg total) by mouth daily. 30 tablet 3  . hydrochlorothiazide (HYDRODIURIL) 25 MG tablet Take 1 tablet (25 mg total) by mouth daily. 90 tablet 3  . ipratropium-albuterol (DUONEB) 0.5-2.5 (3) MG/3ML SOLN USE ONE VIAL IN NEBULIZER 4 TIMES DAILY 360 mL 5  . OXYGEN Inhale 2-3 L into the lungs continuous. When exerting self     . potassium chloride SA (K-DUR,KLOR-CON) 20 MEQ tablet Take 1 tablet (20 mEq total)  by mouth 2 (two) times daily. (Patient taking differently: Take 20 mEq by mouth 2 (two) times a week. ) 180 tablet 1  . Revefenacin (YUPELRI) 175 MCG/3ML SOLN Inhale 1 vial into the lungs daily. j44.9 90 mL 5  . roflumilast (DALIRESP) 500 MCG TABS tablet Take 1 tablet (500 mcg total) by mouth daily. 30 tablet 11  . sodium chloride (OCEAN) 0.65 % SOLN nasal spray Place 1 spray into both nostrils as needed (dryness).    [name] tiZANidine (ZANAFLEX) 4 MG tablet Take 1 tablet (4 mg total) by mouth every 8 (eight) hours as needed for muscle spasms. 60 tablet 5   No current facility-administered medications on file prior to visit.    Allergies  Allergen Reactions  . Benicar [Olmesartan] Swelling    Swelling of face and arms   . [name] [Valsartan] Swelling    Swelling of face and arms   . Lisinopril Cough  . Monosodium Glutamate Other (See Comments)    Facial swelling per pt  . Codeine Other (See Comments)    jittery  . Hydrocodone-Acetaminophen Nausea And Vomiting  . Lead Acetate Rash  . Nickel Rash    Severe rash to infection: pt is allergic to all metals other than sterling silver or gold jewelry.

## 2018-02-10 NOTE — Telephone Encounter (Signed)
Yes - lasix only for 3 days. Some risk for low Na but is low risk. She really needs to cut down on soda and salt

## 2018-02-10 NOTE — Telephone Encounter (Signed)
Called pt and advised message from the provider. Pt understood and verbalized understanding. Nothing further is needed.    

## 2018-02-11 DIAGNOSIS — J449 Chronic obstructive pulmonary disease, unspecified: Secondary | ICD-10-CM | POA: Diagnosis not present

## 2018-02-13 ENCOUNTER — Encounter: Payer: Self-pay | Admitting: Cardiovascular Disease

## 2018-02-13 ENCOUNTER — Ambulatory Visit: Payer: Medicare Other | Admitting: Cardiovascular Disease

## 2018-02-13 VITALS — BP 164/52 | HR 84 | Ht 64.0 in | Wt 203.8 lb

## 2018-02-13 DIAGNOSIS — J449 Chronic obstructive pulmonary disease, unspecified: Secondary | ICD-10-CM | POA: Diagnosis not present

## 2018-02-13 DIAGNOSIS — I2510 Atherosclerotic heart disease of native coronary artery without angina pectoris: Secondary | ICD-10-CM | POA: Diagnosis not present

## 2018-02-13 DIAGNOSIS — Z853 Personal history of malignant neoplasm of breast: Secondary | ICD-10-CM

## 2018-02-13 DIAGNOSIS — R011 Cardiac murmur, unspecified: Secondary | ICD-10-CM | POA: Diagnosis not present

## 2018-02-13 DIAGNOSIS — I10 Essential (primary) hypertension: Secondary | ICD-10-CM | POA: Diagnosis not present

## 2018-02-13 DIAGNOSIS — G4733 Obstructive sleep apnea (adult) (pediatric): Secondary | ICD-10-CM

## 2018-02-13 DIAGNOSIS — H18413 Arcus senilis, bilateral: Secondary | ICD-10-CM

## 2018-02-13 DIAGNOSIS — Z6834 Body mass index (BMI) 34.0-34.9, adult: Secondary | ICD-10-CM

## 2018-02-13 DIAGNOSIS — Z79899 Other long term (current) drug therapy: Secondary | ICD-10-CM

## 2018-02-13 DIAGNOSIS — E6609 Other obesity due to excess calories: Secondary | ICD-10-CM

## 2018-02-13 DIAGNOSIS — E785 Hyperlipidemia, unspecified: Secondary | ICD-10-CM

## 2018-02-13 MED ORDER — METOPROLOL TARTRATE 50 MG PO TABS: ORAL_TABLET | ORAL | 0 refills | Status: DC

## 2018-02-13 NOTE — Patient Instructions (Signed)
Medication Instructions:  Your physician recommends that you continue on your current medications as directed. Please refer to the Current Medication list given to you today.  Labwork: Please return for FASTING labs 1 WEEK PRIOR TO CT (CMET, CBC, Lipid, TSH)  Our in office lab hours are Monday-Friday 8:00-4:00, closed for lunch 12:45-1:45 pm.  No appointment needed.  Testing/Procedures: Your physician has requested that you have an echocardiogram. Echocardiography is a painless test that uses sound waves to create images of your heart. It provides your doctor with information about the size and shape of your heart and how well your heart's chambers and valves are working. This procedure takes approximately one hour. There are no restrictions for this procedure.  This will be done at our [facility] location:  [facility] has requested that you have cardiac CT. Cardiac computed tomography (CT) is a painless test that uses an x-ray machine to take clear, detailed pictures of your heart. For further information please visit [email or web address]. Please follow instruction sheet as given.  Follow-Up: 2 months with Dr. Claiborne Billings  Any Other Special Instructions Will Be Listed Below (If Applicable).     If you need a refill on your cardiac medications before your next appointment, please call your pharmacy.

## 2018-02-13 NOTE — Progress Notes (Signed)
Cardiology Office Note    Date:  02/13/2018   ID:  Victoria, [name] 11-20-1945, MRN [id number]  PCP:  Janith Lima, MD  Cardiologist:  Shelva Majestic, MD   New cardiology evaluation referred through the courtesy of [place] for evaluation of coronary atherosclerosis  History of Present Illness:  Victoria [name] is a 72 y.o. female who was referred by Dr. Jana Hakim after her recent chest CT suggested multivessel CAD.  Victoria [name] is a long-standing history of smoking for 50 years but fortunately quit smoking in 2012.  He has a history of hypertension and I may have seen her over 20 years ago on one occasion.  In 2010, she was seen by Dr. Tina Griffiths return ECG was interpreted as abnormal and suggested evidence for old septal infarct and incomplete right bundle branch block.  An echo Doppler study showed mild to moderate LVH, mild to moderate pulmonary hypertension and aortic and mitral sclerosis without stenosis.  A myocardial perfusion study did not show evidence of ischemia.  She has a history of severe COPD, obesity, and sleep apnea on CPAP therapy followed by Dr. Chase Caller.  He has a history of breast CA, and is status post lumpectomy followed by Dr. Jana Hakim.  She recently underwent a chest CT for lung screening surveillance which did not show evidence for lung cancer.  However, there was a suggestion of aortic and coronary atherosclerosis including calcified plaque in the left main, LAD, circumflex and RCA.  She denies any anginal type symptoms.  Her shortness of breath is felt to be contributed to her COPD.  She is referred for cardiology evaluation.  Past Medical History:  Diagnosis Date  . Abdominal aneurysm ([place])    Dr. Oneida Alar follows lLOV 2 ''17 per pt "around 2 cm"  . Anemia    as a child  . Arthritis    left ankle, right knee, right SI joint, wrists, lower back  . Breast cancer in female [facility])    Left  . COPD (chronic obstructive pulmonary disease) ([place])    ephysema-Dr.  Chase Caller  . Dyspnea   . Headache    as a child would have terrible headaches during season changes  . 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 Hyperlipidemia   . Hypertension   . Multiple thyroid nodules   . Murmur, cardiac 1950  . Osteoporosis   . Pneumonia   . Pre-diabetes   . Requires continuous at home supplemental oxygen    2 L 24/7  . Sebaceous cyst    hairline sebaceous cyst left posterior neck to be excised 04-13-16 by Dr. Harlow Asa in [place] [facility]  . Sleep apnea    cpap used sometimes, uses oxygen concentrator 2 l/m nasally bedtime  . Varicella as child    Past Surgical History:  Procedure Laterality Date  . APPENDECTOMY     2008  . BREAST LUMPECTOMY WITH RADIOACTIVE SEED LOCALIZATION Left 08/10/2017   Procedure: BREAST LUMPECTOMY WITH RADIOACTIVE SEED LOCALIZATION;  Surgeon: Rolm Bookbinder, MD;  Location: [place];  Service: General;  Laterality: Left;  . [place]  . COLONOSCOPY    . COLONOSCOPY WITH PROPOFOL N/A 04/16/2016   Procedure: COLONOSCOPY WITH PROPOFOL;  Surgeon: Carol Ada, MD;  Location: [facility];  Service: Endoscopy;  Laterality: N/A;  . CYST REMOVAL NECK Left 04/13/2016   Procedure: EXCISION OF SEBACEOUS CYST LEFT POSTERIOR NECK;  Surgeon: Armandina Gemma, MD;  Location: [place];  Service: General;  Laterality: Left;  . Excision of Pelvic Absess, Right Ovary     2008  . RE-EXCISION OF BREAST CANCER,SUPERIOR MARGINS Left 08/31/2017   Procedure: RE-EXCISION OF LEFT  BREAST MARGINS ERAS PATHWAY;  Surgeon: Rolm Bookbinder, MD;  Location: [place];  Service: General;  Laterality: Left;  . TUBAL LIGATION  1980    Current Medications: Outpatient Medications Prior to Visit  Medication Sig Dispense Refill  . acetaminophen (TYLENOL) 500 MG tablet Take 1,000 mg by mouth every 6 (six) hours as needed.    . Albuterol Sulfate (PROAIR RESPICLICK) 627 (90 Base) MCG/ACT AEPB Inhale 1-2 puffs into the lungs every 6 (six) hours as needed.  (Patient taking differently: Inhale 1-2 puffs into the lungs every 6 (six) hours as needed (for wheezing/shortness of breath). ) 1 each 5  . amLODipine (NORVASC) 10 MG tablet TAKE 1 TABLET BY MOUTH ONCE DAILY 90 tablet 0  . anastrozole (ARIMIDEX) 1 MG tablet Take 1 tablet (1 mg total) by mouth daily. 90 tablet 4  . arformoterol (BROVANA) 15 MCG/2ML NEBU Take 2 mLs (15 mcg total) by nebulization 2 (two) times daily. 180 mL 2  . atorvastatin (LIPITOR) 40 MG tablet TAKE 1 TABLET BY MOUTH ONCE DAILY IN THE MORNING 90 tablet 0  . budesonide (PULMICORT) 0.5 MG/2ML nebulizer solution Take 2 mLs (0.5 mg total) by nebulization 2 (two) times daily. J44.9 120 mL 12  . Cholecalciferol (VITAMIN D3) 2000 units TABS Take 4,000 Units by mouth daily.    [name] denosumab (PROLIA) 60 MG/ML SOLN injection Inject 60 mg into the skin every 6 (six) months. Administer in upper arm, thigh, or abdomen    . Fluticasone-Umeclidin-Vilant (TRELEGY ELLIPTA) 100-62.5-25 MCG/INH AEPB Inhale 62.5 mcg into the lungs daily. Place on hold 1 each 5  . hydrochlorothiazide (HYDRODIURIL) 25 MG tablet Take 1 tablet (25 mg total) by mouth daily. 90 tablet 3  . ipratropium-albuterol (DUONEB) 0.5-2.5 (3) MG/3ML SOLN USE ONE VIAL IN NEBULIZER 4 TIMES DAILY 360 mL 5  . OXYGEN Inhale 2-3 L into the lungs continuous. When exerting self     . potassium chloride SA (K-DUR,KLOR-CON) 20 MEQ tablet Take 1 tablet (20 mEq total) by mouth 2 (two) times daily. (Patient taking differently: Take 20 mEq by mouth 2 (two) times a week. ) 180 tablet 1  . Revefenacin (YUPELRI) 175 MCG/3ML SOLN Inhale 1 vial into the lungs daily. j44.9 90 mL 5  . roflumilast (DALIRESP) 500 MCG TABS tablet Take 1 tablet (500 mcg total) by mouth daily. 30 tablet 11  . sodium chloride (OCEAN) 0.65 % SOLN nasal spray Place 1 spray into both nostrils as needed (dryness).    [name] tiZANidine (ZANAFLEX) 4 MG tablet Take 1 tablet (4 mg total) by mouth every 8 (eight) hours as needed for muscle  spasms. 60 tablet 5  . furosemide (LASIX) 20 MG tablet Take 1 tablet (20 mg total) by mouth daily. 30 tablet 3   No facility-administered medications prior to visit.      Allergies:   Benicar [olmesartan]; [name] [valsartan]; Lisinopril; Monosodium glutamate; Codeine; Hydrocodone-acetaminophen; Lead acetate; and Nickel   Social History   Socioeconomic History  . Marital status: Divorced    Spouse name: Not on file  . Number of children: 1  . Years of education: 83  . Highest education level: Not on file  Occupational History  . Occupation: Social worker, Technical sales engineer: [employer]  Social Needs  . Financial resource strain: Somewhat hard  .  Food insecurity:    Worry: Sometimes true    Inability: Sometimes true  . Transportation needs:    Medical: No    Non-medical: No  Tobacco Use  . Smoking status: Former Smoker    Packs/day: 1.00    Years: 50.00    Pack years: 50.00    Types: Cigarettes    Last attempt to quit: 04/16/2011    Years since quitting: 6.8  . Smokeless tobacco: Never Used  . Tobacco comment: quit that date when she had to go to ER   Substance and Sexual Activity  . Alcohol use: Yes    Alcohol/week: 0.0 standard drinks    Comment: rare occasion  . Drug use: No    Comment: no marijuana since 2012  . Sexual activity: Never  Lifestyle  . Physical activity:    Days per week: 3 days    Minutes per session: 30 min  . Stress: Only a little  Relationships  . Social connections:    Talks on phone: More than three times a week    Gets together: More than three times a week    Attends religious service: More than 4 times per year    Active member of club or organization: Not on file    Attends meetings of clubs or organizations: More than 4 times per year    Relationship status: Divorced  Other Topics Concern  . Not on file  Social History Narrative   HSG; [name], [place]. . Married '69 - 9 yrs/divorced. 1  son - '72; no grandchildren.   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 No h/o physical or sexual abuse. ACP - no living will - wants information. Provided packet of information. On 08/05/2011: she stated she was distant cousins to celebrities [name] and [name]        Family History:  The patient's family history includes Alzheimer's disease in her brother and father; Breast cancer in her paternal grandmother; Diabetes in her brother; Heart disease in her mother; Hyperlipidemia in her brother and brother; Hypertension in her brother, brother, and mother; Stroke (age of onset: 74) in her father.   ROS General: Negative; No fevers, chills, or night sweats; obesity HEENT: Negative; No changes in vision or hearing, sinus congestion, difficulty swallowing Pulmonary: Severe COPD Cardiovascular: Negative; No chest pain, presyncope, syncope, palpitations GI: Negative; No nausea, vomiting, diarrhea, or abdominal pain GU: Negative; No dysuria, hematuria, or difficulty voiding Musculoskeletal: Negative; no myalgias, joint pain, or weakness Hematologic/Oncology: Negative; no easy bruising, bleeding Endocrine: Negative; no heat/cold intolerance; no diabetes Neuro: Negative; no changes in balance, headaches Skin: Negative; No rashes or skin lesions Psychiatric: Negative; No behavioral problems, depression Sleep: Sleep apnea on CPAP therapy; No residual snoring, daytime sleepiness, hypersomnolence, bruxism, restless legs, hypnogognic hallucinations, no cataplexy Other comprehensive 14 point system review is negative.   PHYSICAL EXAM:   VS:  BP (!) 164/52   Pulse 84   Ht '5\' 4"'  (1.626 m)   Wt 203 lb 12.8 oz (92.4 kg)   BMI 34.98 kg/m     Repeat blood pressure by me 124/60  Wt Readings from Last 3 Encounters:  02/13/18 203 lb 12.8 oz (92.4 kg)  02/09/18 206 lb 9.6 oz (93.7 kg)  01/10/18 201 lb 9.6 oz (91.4 kg)    General: Alert, oriented, no distress.  Skin:  normal turgor, no rashes, warm and dry HEENT: Normocephalic, atraumatic. Pupils equal round and reactive to light; sclera anicteric; extraocular  muscles intact; significant bilateral arcus senilis.  There appears to be bilateral cataracts.  Discs flat Nose without nasal septal hypertrophy Mouth/Parynx benign; Mallinpatti scale 3 Neck: No JVD, no carotid bruits; normal carotid upstroke Lungs: clear to ausculatation and percussion; no wheezing or rales Chest wall: without tenderness to palpitation Heart: PMI not displaced, RRR, s1 s2 normal, 1/6 systolic murmur, no diastolic murmur, no rubs, gallops, thrills, or heaves Abdomen: Central adiposity; soft, nontender; no hepatosplenomehaly, BS+; abdominal aorta nontender and not dilated by palpation. Back: no CVA tenderness Pulses 2+ Musculoskeletal: full range of motion, normal strength, no joint deformities Extremities: no clubbing cyanosis or edema, Homan's sign negative  Neurologic: grossly nonfocal; Cranial nerves grossly wnl Psychologic: Normal mood and affect   Studies/Labs Reviewed:   EKG:  EKG is  ordered today.  ECG (independently read by me): Normal sinus rhythm at 84 bpm.  QS V1 V2.  No ST segment changes.  Normal intervals.  Recent Labs: BMP Latest Ref Rng & Units 11/23/2017 08/31/2017 08/10/2017  Glucose 70 - 99 mg/dL 133(H) 166(H) 179(H)  BUN 8 - 23 mg/dL 17 16 -  Creatinine 0.44 - 1.00 mg/dL 0.98 0.90 -  Sodium 135 - 145 mmol/L 139 137 139  Potassium 3.5 - 5.1 mmol/L 3.3(L) 4.0 3.5  Chloride 98 - 111 mmol/L 97(L) 103 -  CO2 22 - 32 mmol/L 31 26 -  Calcium 8.9 - 10.3 mg/dL 10.0 9.7 -     Hepatic Function Latest Ref Rng & Units 11/23/2017 07/06/2017 10/14/2015  Total Protein 6.5 - 8.1 g/dL 7.2 6.6 6.9  Albumin 3.5 - 5.0 g/dL 3.8 3.4(L) 4.0  AST 15 - 41 U/L 12(L) 9 10  ALT 0 - 44 U/L '9 7 5  ' Alk Phosphatase 38 - 126 U/L 100 130 76  Total Bilirubin 0.3 - 1.2 mg/dL 0.4 0.3 0.4  Bilirubin, Direct 0.0 - 0.3 mg/dL - - -    CBC  Latest Ref Rng & Units 11/23/2017 08/31/2017 08/10/2017  WBC 3.9 - 10.3 K/uL 10.6(H) 10.6(H) -  Hemoglobin 11.6 - 15.9 g/dL 12.8 13.5 12.2  Hematocrit 34.8 - 46.6 % 38.6 42.5 36.0  Platelets 145 - 400 K/uL 223 241 -   Lab Results  Component Value Date   MCV 86.7 11/23/2017   MCV 89.7 08/31/2017   MCV 87.4 08/08/2017   Lab Results  Component Value Date   TSH 0.18 (L) 01/10/2018   Lab Results  Component Value Date   HGBA1C 7.3 (H) 08/31/2017     BNP No results found for: BNP  ProBNP No results found for: PROBNP   Lipid Panel     Component Value Date/Time   CHOL 162 01/13/2017 1646   TRIG 102.0 01/13/2017 1646   HDL 57.50 01/13/2017 1646   CHOLHDL 3 01/13/2017 1646   VLDL 20.4 01/13/2017 1646   LDLCALC 84 01/13/2017 1646   LDLDIRECT 121.0 09/05/2014 1649     RADIOLOGY: No results found.   Additional studies/ records that were reviewed today include:  I reviewed the CT scans, and records of Dr. Jana Hakim and Chase Caller   ASSESSMENT:    No diagnosis found.   PLAN:  Ms. Kratz is a 72 year old African-American female with advanced COPD, obesity, sleep apnea, and chronic hypoxemic respiratory issues.  She previously had smoked for 50 years but fortunately quit in 2012.  She has a history of breast CA and is status post lumpectomy and is followed by Dr. Jana Hakim.  Her recent CT scan suggests aortic as well  as coronary atherosclerosis with plaque involving all of her coronary vessels.  She specifically denies any chest pain.  She uses supplemental oxygen.  I am recommending she undergo an echo Doppler study to evaluate both systolic and diastolic function as well as PA pressure.  I will schedule her for CT coronary angio to evaluate the extent of her coronary atherosclerosis and if moderate stenosis is identified FFR will be necessary.  I am recommending complete set of fasting laboratory.  In August 2018 her LDL cholesterol was 84.  She is on atorvastatin 40 mg.  Target LDL  is less than 70 and dose titration may be necessary depending upon laboratory.  Her blood pressure today on repeat by me was stable at 124/60 on her regimen consisting of amlodipine 10 mg, and hydrochlorothiazide.  She is on DuoNeb, [place] and  was started on Trelegy inhaler for COPD.  She is on Prolia following her breast CA.  I will see her in follow-up of the above studies and further recommendations will be made at that time.   Medication Adjustments/Labs and Tests Ordered: Current medicines are reviewed at length with the patient today.  Concerns regarding medicines are outlined above.  Medication changes, Labs and Tests ordered today are listed in the Patient Instructions below. There are no Patient Instructions on file for this visit.   Signed, Shelva Majestic, MD  02/13/2018 12:19 PM    [facility] [address] [place], [place], [place]  [address] Phone: [phone]

## 2018-02-14 ENCOUNTER — Telehealth: Payer: Self-pay | Admitting: Oncology

## 2018-02-14 ENCOUNTER — Inpatient Hospital Stay: Payer: Medicare Other | Attending: Oncology | Admitting: Adult Health

## 2018-02-14 ENCOUNTER — Encounter: Payer: Self-pay | Admitting: Adult Health

## 2018-02-14 VITALS — BP 141/72 | HR 77 | Temp 98.3°F | Resp 18 | Ht 64.0 in | Wt 207.2 lb

## 2018-02-14 DIAGNOSIS — I10 Essential (primary) hypertension: Secondary | ICD-10-CM | POA: Diagnosis not present

## 2018-02-14 DIAGNOSIS — M810 Age-related osteoporosis without current pathological fracture: Secondary | ICD-10-CM | POA: Diagnosis not present

## 2018-02-14 DIAGNOSIS — Z79899 Other long term (current) drug therapy: Secondary | ICD-10-CM | POA: Diagnosis not present

## 2018-02-14 DIAGNOSIS — Z79811 Long term (current) use of aromatase inhibitors: Secondary | ICD-10-CM | POA: Insufficient documentation

## 2018-02-14 DIAGNOSIS — Z885 Allergy status to narcotic agent status: Secondary | ICD-10-CM | POA: Diagnosis not present

## 2018-02-14 DIAGNOSIS — Z170 Estrogen receptor positive status [ER+]: Secondary | ICD-10-CM

## 2018-02-14 DIAGNOSIS — Z923 Personal history of irradiation: Secondary | ICD-10-CM | POA: Diagnosis not present

## 2018-02-14 DIAGNOSIS — Z87891 Personal history of nicotine dependence: Secondary | ICD-10-CM

## 2018-02-14 DIAGNOSIS — Z803 Family history of malignant neoplasm of breast: Secondary | ICD-10-CM | POA: Diagnosis not present

## 2018-02-14 DIAGNOSIS — Z9981 Dependence on supplemental oxygen: Secondary | ICD-10-CM | POA: Insufficient documentation

## 2018-02-14 DIAGNOSIS — C50412 Malignant neoplasm of upper-outer quadrant of left female breast: Secondary | ICD-10-CM

## 2018-02-14 NOTE — Telephone Encounter (Signed)
Gave avs and calendar ° °

## 2018-02-14 NOTE — Progress Notes (Signed)
CLINIC:  Survivorship   REASON FOR VISIT:  Routine follow-up post-treatment for a recent history of breast cancer.  BRIEF ONCOLOGIC HISTORY:    Malignant neoplasm of upper-outer quadrant of left breast in female, estrogen receptor positive ([place])   06/24/2017 Initial Biopsy    status post left breast upper outer quadrant biopsy 06/24/2017 for a clinical T1b N0, stage IA invasive ductal carcinoma, grade 1, estrogen and progesterone receptor positive, HER-2 not amplified, with an MIB-5%    07/01/2017 Initial Diagnosis    Malignant neoplasm of upper-outer quadrant of left breast in female, estrogen receptor positive ([place])    08/10/2017 Surgery    Left lumpectomy ([facility]): IDC, g2, 1.2cm, DCIS, anterior margin positive, 0SLN biopsied, T1c, Nx    08/31/2017 Surgery    Margins cleared with re excision    09/2017 -  Anti-estrogen oral therapy    Anastrozole daily:  osteoporosis: bone density on 02/27/2014 showed a T score of -2.9             (a) bone density on 12/08/2016 showed a T score of -2.4 (osteopenia).     INTERVAL HISTORY:  Victoria [name] presents to the Survivorship Clinic today for our initial meeting to review her survivorship care plan detailing her treatment course for breast cancer, as well as monitoring long-term side effects of that treatment, education regarding health maintenance, screening, and overall wellness and health promotion.     Overall, Victoria [name] reports feeling quite well.  She was seen by Dr. Claiborne Billings in cardiology yesterday due to plaque noted on Victoria [name]'s recent ct scan.  She will undergo echocardiogram, fasting labs and repeat CT scan.  She does get Prolia every 6 months for her osteoporosis.    Victoria [name] notes her knees are increasingly achy at bedtime.  She takes a tylenol pm for this.  She occasionally will have to take a muscle relaxer. Sometimes upon standing, particularly noted at church it is painful.    Victoria [name] is taking Anastrozole daily and is tolerating it  very well.      REVIEW OF SYSTEMS:  Review of Systems  Constitutional: Negative for appetite change, chills, fatigue, fever and unexpected weight change.  HENT:   Negative for hearing loss, sore throat and trouble swallowing.   Eyes: Negative for eye problems and icterus.  Respiratory: Negative for chest tightness, cough and shortness of breath.   Cardiovascular: Negative for chest pain, leg swelling and palpitations.  Gastrointestinal: Negative for abdominal distention, abdominal pain, constipation, diarrhea, nausea and vomiting.  Endocrine: Positive for hot flashes (occasional).  Musculoskeletal: Positive for arthralgias (in her knees bilaterally).  Skin: Negative for itching and rash.  Neurological: Negative for dizziness, extremity weakness, headaches and numbness.  Hematological: Negative for adenopathy. Does not bruise/bleed easily.  Psychiatric/Behavioral: Negative for depression. The patient is not nervous/anxious.   Breast: Denies any new nodularity, masses, tenderness, nipple changes, or nipple discharge.      ONCOLOGY TREATMENT TEAM:  1. Surgeon:  Dr. Donne Hazel at [facility] 2. Medical Oncologist: Dr. Jana Hakim  3. Radiation Oncologist: Dr. Lindi Adie    PAST MEDICAL/SURGICAL HISTORY:  Past Medical History:  Diagnosis Date  . Abdominal aneurysm ([place])    Dr. Oneida Alar follows lLOV 2 ''17 per pt "around 2 cm"  . Anemia    as a child  . Arthritis    left ankle, right knee, right SI joint, wrists, lower back  . Breast cancer in female [facility]    Left  . COPD (chronic obstructive pulmonary  disease) ([place])    ephysema-Dr. Chase Caller  . Dyspnea   . Headache    as a child would have terrible headaches during season changes  . 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 Hyperlipidemia   . Hypertension   . Multiple thyroid nodules   . Murmur, cardiac 1950  . Osteoporosis   . Pneumonia   . Pre-diabetes   . Requires continuous at home supplemental oxygen    2 L  24/7  . Sebaceous cyst    hairline sebaceous cyst left posterior neck to be excised 04-13-16 by Dr. Harlow Asa in [place] [facility]  . Sleep apnea    cpap used sometimes, uses oxygen concentrator 2 l/m nasally bedtime  . Varicella as child   Past Surgical History:  Procedure Laterality Date  . APPENDECTOMY     2008  . BREAST LUMPECTOMY WITH RADIOACTIVE SEED LOCALIZATION Left 08/10/2017   Procedure: BREAST LUMPECTOMY WITH RADIOACTIVE SEED LOCALIZATION;  Surgeon: Rolm Bookbinder, MD;  Location: [facility];  Service: General;  Laterality: Left;  . [place]  . COLONOSCOPY    . COLONOSCOPY WITH PROPOFOL N/A 04/16/2016   Procedure: COLONOSCOPY WITH PROPOFOL;  Surgeon: Carol Ada, MD;  Location: [facility];  Service: Endoscopy;  Laterality: N/A;  . CYST REMOVAL NECK Left 04/13/2016   Procedure: EXCISION OF SEBACEOUS CYST LEFT POSTERIOR NECK;  Surgeon: Armandina Gemma, MD;  Location: [facility];  Service: General;  Laterality: Left;  . Excision of Pelvic Absess, Right Ovary     2008  . RE-EXCISION OF BREAST CANCER,SUPERIOR MARGINS Left 08/31/2017   Procedure: RE-EXCISION OF LEFT  BREAST MARGINS ERAS PATHWAY;  Surgeon: Rolm Bookbinder, MD;  Location: [facility];  Service: General;  Laterality: Left;  . TUBAL LIGATION  1980     ALLERGIES:  Allergies  Allergen Reactions  . Benicar [Olmesartan] Swelling    Swelling of face and arms   . [name] [Valsartan] Swelling    Swelling of face and arms   . Lisinopril Cough  . Monosodium Glutamate Other (See Comments)    Facial swelling per pt  . Codeine Other (See Comments)    jittery  . Hydrocodone-Acetaminophen Nausea And Vomiting  . Lead Acetate Rash  . Nickel Rash    Severe rash to infection: pt is allergic to all metals other than sterling silver or gold jewelry.      CURRENT MEDICATIONS:  Outpatient Encounter Medications as of 02/14/2018  Medication Sig  . acetaminophen (TYLENOL) 500 MG tablet Take 1,000 mg by mouth every 6 (six)  hours as needed.  . Albuterol Sulfate (PROAIR RESPICLICK) 071 (90 Base) MCG/ACT AEPB Inhale 1-2 puffs into the lungs every 6 (six) hours as needed. (Patient taking differently: Inhale 1-2 puffs into the lungs every 6 (six) hours as needed (for wheezing/shortness of breath). )  . amLODipine (NORVASC) 10 MG tablet TAKE 1 TABLET BY MOUTH ONCE DAILY  . anastrozole (ARIMIDEX) 1 MG tablet Take 1 tablet (1 mg total) by mouth daily.  [name] arformoterol (BROVANA) 15 MCG/2ML NEBU Take 2 mLs (15 mcg total) by nebulization 2 (two) times daily.  [name] atorvastatin (LIPITOR) 40 MG tablet TAKE 1 TABLET BY MOUTH ONCE DAILY IN THE MORNING  . budesonide (PULMICORT) 0.5 MG/2ML nebulizer solution Take 2 mLs (0.5 mg total) by nebulization 2 (two) times daily. J44.9  . Cholecalciferol (VITAMIN D3) 2000 units TABS Take 4,000 Units by mouth daily.  [name] denosumab (PROLIA) 60 MG/ML SOLN injection Inject  60 mg into the skin every 6 (six) months. Administer in upper arm, thigh, or abdomen  . Fluticasone-Umeclidin-Vilant (TRELEGY ELLIPTA) 100-62.5-25 MCG/INH AEPB Inhale 62.5 mcg into the lungs daily. Place on hold  . hydrochlorothiazide (HYDRODIURIL) 25 MG tablet Take 1 tablet (25 mg total) by mouth daily.  [name] ipratropium-albuterol (DUONEB) 0.5-2.5 (3) MG/3ML SOLN USE ONE VIAL IN NEBULIZER 4 TIMES DAILY  . metoprolol tartrate (LOPRESSOR) 50 MG tablet Take 50 mg (1 tablet) ONE hour prior to CT  . OXYGEN Inhale 2-3 L into the lungs continuous. When exerting self   . potassium chloride SA (K-DUR,KLOR-CON) 20 MEQ tablet Take 1 tablet (20 mEq total) by mouth 2 (two) times daily. (Patient taking differently: Take 20 mEq by mouth 2 (two) times a week. )  . Revefenacin (YUPELRI) 175 MCG/3ML SOLN Inhale 1 vial into the lungs daily. j44.9  . roflumilast (DALIRESP) 500 MCG TABS tablet Take 1 tablet (500 mcg total) by mouth daily.  . sodium chloride (OCEAN) 0.65 % SOLN nasal spray Place 1 spray into both nostrils as needed (dryness).  [name] tiZANidine  (ZANAFLEX) 4 MG tablet Take 1 tablet (4 mg total) by mouth every 8 (eight) hours as needed for muscle spasms.   No facility-administered encounter medications on file as of 02/14/2018.      ONCOLOGIC FAMILY HISTORY:  Family History  Problem Relation Age of Onset  . Heart disease Mother        MI - fatal  . Hypertension Mother   . Stroke Father 32       fatal  . Alzheimer's disease Father   . Alzheimer's disease Brother   . Hyperlipidemia Brother   . Hypertension Brother   . Diabetes Brother   . Hypertension Brother   . Hyperlipidemia Brother   . Breast cancer Paternal Grandmother      GENETIC COUNSELING/TESTING: Not at this time  SOCIAL HISTORY:  Social History   Socioeconomic History  . Marital status: Divorced    Spouse name: Not on file  . Number of children: 1  . Years of education: 56  . Highest education level: Not on file  Occupational History  . Occupation: Social worker, Technical sales engineer: [employer]  Social Needs  . Financial resource strain: Somewhat hard  . Food insecurity:    Worry: Sometimes true    Inability: Sometimes true  . Transportation needs:    Medical: No    Non-medical: No  Tobacco Use  . Smoking status: Former Smoker    Packs/day: 1.00    Years: 50.00    Pack years: 50.00    Types: Cigarettes    Last attempt to quit: 04/16/2011    Years since quitting: 6.8  . Smokeless tobacco: Never Used  . Tobacco comment: quit that date when she had to go to ER   Substance and Sexual Activity  . Alcohol use: Yes    Alcohol/week: 0.0 standard drinks    Comment: rare occasion  . Drug use: No    Comment: no marijuana since 2012  . Sexual activity: Never  Lifestyle  . Physical activity:    Days per week: 3 days    Minutes per session: 30 min  . Stress: Only a little  Relationships  . Social connections:    Talks on phone: More than three times a week    Gets together: More than three times a week    Attends religious service: More  than 4 times per year  Active member of club or organization: Not on file    Attends meetings of clubs or organizations: More than 4 times per year    Relationship status: Divorced  . Intimate partner violence:    Fear of current or ex partner: Not on file    Emotionally abused: Not on file    Physically abused: Not on file    Forced sexual activity: Not on file  Other Topics Concern  . Not on file  Social History Narrative   HSG; [name], [place]. . Married '69 - 9 yrs/divorced. 1 son - '72; no grandchildren.   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 No h/o physical or sexual abuse. ACP - no living will - wants information. Provided packet of information. On 08/05/2011: she stated she was distant cousins to celebrities [name] and [name]        PHYSICAL EXAMINATION:  Vital Signs:   Vitals:   02/14/18 1417  BP: (!) 141/72  Pulse: 77  Resp: 18  Temp: 98.3 F (36.8 C)  SpO2: 100%   Filed Weights   02/14/18 1417  Weight: 207 lb 3.2 oz (94 kg)   General: Well-nourished, well-appearing female in no acute distress.  She is unaccompanied today.   HEENT: Head is normocephalic.  Pupils equal and reactive to light. Conjunctivae clear without exudate.  Sclerae anicteric. Oral mucosa is pink, moist.  Oropharynx is pink without lesions or erythema.  Lymph: No cervical, supraclavicular, or infraclavicular lymphadenopathy noted on palpation.  Cardiovascular: Regular rate and rhythm.[name] Respiratory: Clear to auscultation bilaterally. Chest expansion symmetric; breathing non-labored.  Breasts: left breast w/o sign of recurrence, s/p lumpectomy, right breast benign GI: Abdomen soft and round; non-tender, non-distended. Bowel sounds normoactive.  GU: Deferred.  Neuro: No focal deficits. Steady gait.  Psych: Mood and affect normal and appropriate for situation.  Extremities: No edema. MSK: No focal spinal tenderness to  palpation.  Full range of motion in bilateral upper extremities Skin: Warm and dry.  LABORATORY DATA:  None for this visit.  DIAGNOSTIC IMAGING:  None for this visit.      ASSESSMENT AND PLAN:  Victoria [name] is a pleasant 72 y.o. female with Stage IA left breast invasive ductal carcinoma, ER+/PR+/HER2-, diagnosed in 06/2017, treated with lumpectomy, adjuvant radiation therapy, and anti-estrogen therapy with Anastrozole beginning in 09/2017.  She presents to the Survivorship Clinic for our initial meeting and routine follow-up post-completion of treatment for breast cancer.    1. Stage IA left breast cancer:  Victoria [name] is continuing to recover from definitive treatment for breast cancer. She will follow-up with her medical oncologist, Dr. Jana Hakim in 6 months with history and physical exam per surveillance protocol.  She will continue her anti-estrogen therapy with Anastrozole. Thus far, she is tolerating the Anastrozole well, with minimal side effects.  Today, a comprehensive survivorship care plan and treatment summary was reviewed with the patient today detailing her breast cancer diagnosis, treatment course, potential late/long-term effects of treatment, appropriate follow-up care with recommendations for the future, and patient education resources.  A copy of this summary, along with a letter will be sent to the patient's primary care provider via mail/fax/In Basket message after today's visit.    2. Bone health:  Given Victoria [name] age/history of breast cancer and her current treatment regimen including anti-estrogen therapy with Anastrozole, she is at risk for bone demineralization.  Her last DEXA scan was 12/08/2016 and showed a T score of -2.4 in the  left femoral neck.  She has had osteoporosis on previous bone density in 02/2014.  She receives Prolia every 6 months with Dr. Ronnald Ramp.  She was given education on specific activities to promote bone health.  3. Cancer screening:  Due to Victoria [name]'s  history and her age, she should receive screening for skin cancers, colon cancer, lung cancer, lung cancer, and gynecologic cancers. The information and recommendations are listed on the patient's comprehensive care plan/treatment summary and were reviewed in detail with the patient.    4. Health maintenance and wellness promotion: Victoria [name] was encouraged to consume 5-7 servings of fruits and vegetables per day. We reviewed the "Nutrition Rainbow" handout, as well as the handout "Take Control of Your Health and Reduce Your Cancer Risk" from the [facility].  She was also encouraged to engage in moderate to vigorous exercise for 30 minutes per day most days of the week. We discussed the [employer] fitness program, which is designed for cancer survivors to help them become more physically fit after cancer treatments.  She was instructed to limit her alcohol consumption and continue to abstain from tobacco use.     5. Support services/counseling: It is not uncommon for this period of the patient's cancer care trajectory to be one of many emotions and stressors.  We discussed an opportunity for her to participate in the next session of [facility] ("Finding Your New Normal") support group series designed for patients after they have completed treatment.   Victoria [name] was encouraged to take advantage of our many other support services programs, support groups, and/or counseling in coping with her new life as a cancer survivor after completing anti-cancer treatment.  She was offered support today through active listening and expressive supportive counseling.  She was given information regarding our available services and encouraged to contact me with any questions or for help enrolling in any of our support group/programs.    Dispo:   -Return to cancer center in 6 months for f/u with Dr. Jana Hakim  -Mammogram due in 05/2018 -She is welcome to return back to the Survivorship Clinic at any time; no  additional follow-up needed at this time.  -Consider referral back to survivorship as a long-term survivor for continued surveillance  A total of (30) minutes of face-to-face time was spent with this patient with greater than 50% of that time in counseling and care-coordination.   Victoria [name], [place] [phone]   Note: PRIMARY CARE PROVIDER R[place], [place], [place] [phone]

## 2018-02-15 ENCOUNTER — Encounter: Payer: Self-pay | Admitting: Cardiovascular Disease

## 2018-02-21 ENCOUNTER — Ambulatory Visit (HOSPITAL_COMMUNITY): Payer: Medicare Other | Attending: Cardiology

## 2018-02-21 ENCOUNTER — Other Ambulatory Visit: Payer: Self-pay

## 2018-02-21 DIAGNOSIS — R011 Cardiac murmur, unspecified: Secondary | ICD-10-CM

## 2018-02-27 ENCOUNTER — Telehealth: Payer: Self-pay | Admitting: Cardiovascular Disease

## 2018-02-27 NOTE — Telephone Encounter (Signed)
Follow Up:     Pt would like her Echo results from 02-21-18 please.

## 2018-02-27 NOTE — Telephone Encounter (Signed)
Notes recorded by Troy Sine, MD on 02/27/2018 at 7:54 AM EDT Normal LV function with an EF 60 to 76%, grade 1 diastolic dysfunction. Mild biatrial enlargement. Annular calcification. Mild TR. Mild lipomatous hypertrophy of the atrial septum  Patient called w/results  She has not heard about when her coronary CT test will be. Message routed to pre-cert/scheduler

## 2018-03-06 ENCOUNTER — Telehealth: Payer: Self-pay | Admitting: Internal Medicine

## 2018-03-06 MED ORDER — FLUTICASONE-UMECLIDIN-VILANT 100-62.5-25 MCG/INH IN AEPB: 62.5000 ug | INHALATION_SPRAY | Freq: Every day | RESPIRATORY_TRACT | 5 refills | Status: DC

## 2018-03-06 NOTE — Telephone Encounter (Signed)
Patient called and stated that she has been using Trelegy per Dr. Chase Caller and that it is working better than previous medication. Patient asked for Rx for Trelegy. I see that during last OV Dr. Chase Caller ordered Trelegy and there is an Rx on hold. Sent in new Rx for Trelegy. Patient also asked to verify when her next appointment with NP is. Gave her that date and time.  Nothing further needed at this time.

## 2018-03-13 DIAGNOSIS — I10 Essential (primary) hypertension: Secondary | ICD-10-CM | POA: Diagnosis not present

## 2018-03-13 DIAGNOSIS — E785 Hyperlipidemia, unspecified: Secondary | ICD-10-CM | POA: Diagnosis not present

## 2018-03-13 DIAGNOSIS — Z79899 Other long term (current) drug therapy: Secondary | ICD-10-CM | POA: Diagnosis not present

## 2018-03-13 DIAGNOSIS — J449 Chronic obstructive pulmonary disease, unspecified: Secondary | ICD-10-CM | POA: Diagnosis not present

## 2018-03-13 DIAGNOSIS — I2510 Atherosclerotic heart disease of native coronary artery without angina pectoris: Secondary | ICD-10-CM | POA: Diagnosis not present

## 2018-03-13 DIAGNOSIS — R011 Cardiac murmur, unspecified: Secondary | ICD-10-CM | POA: Diagnosis not present

## 2018-03-14 ENCOUNTER — Ambulatory Visit (HOSPITAL_COMMUNITY)
Admission: RE | Admit: 2018-03-14 | Discharge: 2018-03-14 | Disposition: A | Discharge status: Home / Self Care | Payer: Medicare Other | Source: Ambulatory Visit | Attending: Cardiovascular Disease | Admitting: Cardiovascular Disease

## 2018-03-14 ENCOUNTER — Ambulatory Visit (HOSPITAL_COMMUNITY): Payer: Medicare Other

## 2018-03-14 DIAGNOSIS — I2510 Atherosclerotic heart disease of native coronary artery without angina pectoris: Secondary | ICD-10-CM | POA: Insufficient documentation

## 2018-03-14 DIAGNOSIS — R079 Chest pain, unspecified: Secondary | ICD-10-CM | POA: Diagnosis not present

## 2018-03-14 LAB — COMPREHENSIVE METABOLIC PANEL
ALK PHOS: 128 IU/L — AB (ref 39–117)
ALT: 9 IU/L (ref 0–32)
AST: 13 IU/L (ref 0–40)
Albumin/Globulin Ratio: 1.7 (ref 1.2–2.2)
Albumin: 4.3 g/dL (ref 3.5–4.8)
BILIRUBIN TOTAL: 0.2 mg/dL (ref 0.0–1.2)
BUN/Creatinine Ratio: 20 (ref 12–28)
BUN: 16 mg/dL (ref 8–27)
CHLORIDE: 99 mmol/L (ref 96–106)
CO2: 25 mmol/L (ref 20–29)
Calcium: 10.1 mg/dL (ref 8.7–10.3)
Creatinine, Ser: 0.82 mg/dL (ref 0.57–1.00)
GFR calc Af Amer: 82 mL/min/{1.73_m2} (ref >59)
GFR calc non Af Amer: 71 mL/min/{1.73_m2} (ref >59)
GLUCOSE: 187 mg/dL — AB (ref 65–99)
Globulin, Total: 2.6 g/dL (ref 1.5–4.5)
Potassium: 3.7 mmol/L (ref 3.5–5.2)
Sodium: 143 mmol/L (ref 134–144)
Total Protein: 6.9 g/dL (ref 6.0–8.5)

## 2018-03-14 LAB — CBC
HEMATOCRIT: 38.7 % (ref 34.0–46.6)
HEMOGLOBIN: 13.1 g/dL (ref 11.1–15.9)
MCH: 28.7 pg (ref 26.6–33.0)
MCHC: 33.9 g/dL (ref 31.5–35.7)
MCV: 85 fL (ref 79–97)
Platelets: 271 10*3/uL (ref 150–450)
RBC: 4.56 x10E6/uL (ref 3.77–5.28)
RDW: 13.5 % (ref 12.3–15.4)
WBC: 10.7 10*3/uL (ref 3.4–10.8)

## 2018-03-14 LAB — LIPID PANEL
CHOL/HDL RATIO: 2.6 ratio (ref 0.0–4.4)
Cholesterol, Total: 176 mg/dL (ref 100–199)
HDL: 69 mg/dL (ref >39)
LDL Calculated: 83 mg/dL (ref 0–99)
TRIGLYCERIDES: 120 mg/dL (ref 0–149)
VLDL Cholesterol Cal: 24 mg/dL (ref 5–40)

## 2018-03-14 LAB — GLUCOSE, CAPILLARY: GLUCOSE-CAPILLARY: 97 mg/dL (ref 70–99)

## 2018-03-14 LAB — TSH: TSH: 0.402 u[IU]/mL — ABNORMAL LOW (ref 0.450–4.500)

## 2018-03-14 MED ORDER — NITROGLYCERIN 0.4 MG SL SUBL
0.8000 mg | SUBLINGUAL_TABLET | Freq: Once | SUBLINGUAL | Status: AC
  Administered 2018-03-14: 0.8 mg via SUBLINGUAL

## 2018-03-14 MED ORDER — NITROGLYCERIN 0.4 MG SL SUBL
SUBLINGUAL_TABLET | SUBLINGUAL | Status: AC
  Filled 2018-03-14: qty 2

## 2018-03-14 MED ORDER — METOPROLOL TARTRATE 5 MG/5ML IV SOLN
5.0000 mg | INTRAVENOUS | Status: DC | PRN
  Filled 2018-03-14: qty 5

## 2018-03-14 MED ORDER — IOPAMIDOL (ISOVUE-370) INJECTION 76%
100.0000 mL | Freq: Once | INTRAVENOUS | Status: AC | PRN
  Administered 2018-03-14: 100 mL via INTRAVENOUS

## 2018-03-20 DIAGNOSIS — G4733 Obstructive sleep apnea (adult) (pediatric): Secondary | ICD-10-CM | POA: Diagnosis not present

## 2018-03-24 ENCOUNTER — Telehealth: Payer: Self-pay | Admitting: Cardiovascular Disease

## 2018-03-24 DIAGNOSIS — I2510 Atherosclerotic heart disease of native coronary artery without angina pectoris: Secondary | ICD-10-CM

## 2018-03-24 DIAGNOSIS — E785 Hyperlipidemia, unspecified: Secondary | ICD-10-CM

## 2018-03-24 DIAGNOSIS — E118 Type 2 diabetes mellitus with unspecified complications: Secondary | ICD-10-CM

## 2018-03-24 NOTE — Telephone Encounter (Signed)
Patient aware of CT results and verbalized understanding.

## 2018-03-24 NOTE — Telephone Encounter (Signed)
Patient is calling for results to her CT scan.  

## 2018-03-25 DIAGNOSIS — G4733 Obstructive sleep apnea (adult) (pediatric): Secondary | ICD-10-CM | POA: Diagnosis not present

## 2018-03-30 MED ORDER — ATORVASTATIN CALCIUM 80 MG PO TABS: 80.0000 mg | ORAL_TABLET | Freq: Every day | ORAL | 1 refills | Status: DC

## 2018-03-30 NOTE — Telephone Encounter (Signed)
Spoke to patient, based on CT results and lipid panel Dr. Claiborne Billings has recommended increasing atorvastatin to 80 mg and recheck labs prior to next OV.  rx sent to pharmacy and lab orders mailed to patient.

## 2018-04-06 ENCOUNTER — Ambulatory Visit: Payer: Medicare Other | Admitting: Adult Health

## 2018-04-06 ENCOUNTER — Telehealth: Payer: Self-pay | Admitting: Internal Medicine

## 2018-04-06 MED ORDER — PREDNISONE 10 MG PO TABS: ORAL_TABLET | ORAL | 0 refills | Status: DC

## 2018-04-06 MED ORDER — DOXYCYCLINE HYCLATE 100 MG PO TABS: 100.0000 mg | ORAL_TABLET | Freq: Two times a day (BID) | ORAL | 0 refills | Status: DC

## 2018-04-06 NOTE — Telephone Encounter (Signed)
Called and spoke to pt. Pt reports of prod cough with greenish to yellow mucus, wheezing & night sweats x3d. Pt denied fever or chills.  Pt is currently taking tussin and OTC mucus relief with no relief.  Pt states oxygen levels are remaining in the mid 90's on room air.  MR please advise. Thanks

## 2018-04-06 NOTE — Telephone Encounter (Signed)
Please take prednisone 40 mg x1 day, then 30 mg x1 day, then 20 mg x1 day, then 10 mg x1 day, and then 5 mg x1 day and stop   Take doxycycline 100mg  po twice daily x 5 days; take after meals and avoid sunlight   Allergies  Allergen Reactions  . Benicar [Olmesartan] Swelling    Swelling of face and arms   . [name] [Valsartan] Swelling    Swelling of face and arms   . Lisinopril Cough  . Monosodium Glutamate Other (See Comments)    Facial swelling per pt  . Codeine Other (See Comments)    jittery  . Hydrocodone-Acetaminophen Nausea And Vomiting  . Lead Acetate Rash  . Nickel Rash    Severe rash to infection: pt is allergic to all metals other than sterling silver or gold jewelry.

## 2018-04-06 NOTE — Telephone Encounter (Signed)
Pt is aware of below recommendations and voiced her understanding. Rx for doxy and prednisone have been sent to preferred pharmacy. Nothing further is needed.

## 2018-04-10 ENCOUNTER — Ambulatory Visit: Payer: Medicare Other | Admitting: Adult Health

## 2018-04-13 DIAGNOSIS — J449 Chronic obstructive pulmonary disease, unspecified: Secondary | ICD-10-CM | POA: Diagnosis not present

## 2018-04-16 ENCOUNTER — Other Ambulatory Visit: Payer: Self-pay | Admitting: Internal Medicine

## 2018-04-16 DIAGNOSIS — I10 Essential (primary) hypertension: Secondary | ICD-10-CM

## 2018-04-17 ENCOUNTER — Encounter: Payer: Self-pay | Admitting: Adult Health

## 2018-04-17 ENCOUNTER — Ambulatory Visit (INDEPENDENT_AMBULATORY_CARE_PROVIDER_SITE_OTHER): Payer: Medicare Other | Admitting: Adult Health

## 2018-04-17 DIAGNOSIS — J449 Chronic obstructive pulmonary disease, unspecified: Secondary | ICD-10-CM

## 2018-04-17 DIAGNOSIS — J9611 Chronic respiratory failure with hypoxia: Secondary | ICD-10-CM | POA: Diagnosis not present

## 2018-04-17 NOTE — Assessment & Plan Note (Signed)
Doing well with improved symptom control on [place]  Patient Instructions  Continue on TRELEGY 1 puff daily  Continue on Daliresp. daily .  Continue oxygen 2l/m . (3l/m on POC) .  Follow up with Dr. Chase Caller in 3 months and As needed

## 2018-04-17 NOTE — Progress Notes (Signed)
@Patient  ID: Victoria [name], female    DOB: 12/24/1945, 72 y.o.   MRN: [id number]  Chief Complaint  Patient presents with  . Follow-up    COPD     Referring provider: Janith Lima, MD  HPI: 72 year old female former smoker followed for gold 3 COPD and oxygen dependent respiratory failure and obstructive sleep apnea on CPAP  TEST /Events Pulmonary function test June 2017: stage III COPD FEV1 0.88 L/47% in 10/21/2015 and 0.69 L/37% later in June 2017 CT chest lung cancer screening 10/09/2015: Emphysema with very small lung nodules Echo January 2016: Shows grade 1 diastolic dysfunction Nuclear medicine cardiac stress to 12/11/2015: Normal tests Lexiscan Myoview with ejection fraction 75% PSG 12/2014 >AHI 35/h   04/17/2018 Follow up : COPD , O2 RF  Patient returns for a follow-up for COPD.  Patient has severe COPD started on Trelegy 2 months ago.  2 weeks ago patient developed increased cough and congestion.  Was called in doxycycline and prednisone.  Patient says she is feeling much better. Feels the Trelegy is working well for her.  She remains on Daliresp daily. Feels the best she has in a while.   Remains on Oxygen 2l/m . Uses a POC , with walking turns it up to 3l/m pulsing .   She does low-dose CT chest screening with last one done in June 2019 with benign appearance.  She has a follow-up CT in June 2020  Works part time with behavior health.      Allergies  Allergen Reactions  . Benicar [Olmesartan] Swelling    Swelling of face and arms   . [name] [Valsartan] Swelling    Swelling of face and arms   . Lisinopril Cough  . Monosodium Glutamate Other (See Comments)    Facial swelling per pt  . Codeine Other (See Comments)    jittery  . Hydrocodone-Acetaminophen Nausea And Vomiting  . Lead Acetate Rash  . Nickel Rash    Severe rash to infection: pt is allergic to all metals other than sterling silver or gold jewelry.     Immunization History  Administered  Date(s) Administered  . Influenza Split 01/16/2011  . Influenza, High Dose Seasonal PF 01/13/2017, 02/09/2018  . Influenza,inj,Quad PF,6+ Mos 02/01/2013, 02/27/2014, 04/07/2015, 01/08/2016  . Pneumococcal Conjugate-13 02/01/2013  . Pneumococcal Polysaccharide-23 10/29/2014  . Td 12/06/2011    Past Medical History:  Diagnosis Date  . Abdominal aneurysm ([place])    Dr. Oneida Alar follows lLOV 2 ''17 per pt "around 2 cm"  . Anemia    as a child  . Arthritis    left ankle, right knee, right SI joint, wrists, lower back  . Breast cancer in female [facility])    Left  . COPD (chronic obstructive pulmonary disease) ([place])    ephysema-Dr. Chase Caller  . Dyspnea   . Headache    as a child would have terrible headaches during season changes  . 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 Hyperlipidemia   . Hypertension   . Multiple thyroid nodules   . Murmur, cardiac 1950  . Osteoporosis   . Pneumonia   . Pre-diabetes   . Requires continuous at home supplemental oxygen    2 L 24/7  . Sebaceous cyst    hairline sebaceous cyst left posterior neck to be excised 04-13-16 by Dr. Harlow Asa in [place] [facility]  . Sleep apnea    cpap used sometimes, uses oxygen concentrator 2 l/m nasally bedtime  .  Varicella as child    Tobacco History: Social History   Tobacco Use  Smoking Status Former Smoker  . Packs/day: 1.00  . Years: 50.00  . Pack years: 50.00  . Types: Cigarettes  . Last attempt to quit: 04/16/2011  . Years since quitting: 7.0  Smokeless Tobacco Never Used  Tobacco Comment   quit that date when she had to go to ER    Counseling given: Not Answered Comment: quit that date when she had to go to ER    Outpatient Medications Prior to Visit  Medication Sig Dispense Refill  . acetaminophen (TYLENOL) 500 MG tablet Take 1,000 mg by mouth every 6 (six) hours as needed.    . Albuterol Sulfate (PROAIR RESPICLICK) 637 (90 Base) MCG/ACT AEPB Inhale 1-2 puffs into the lungs every 6 (six) hours as  needed. (Patient taking differently: Inhale 1-2 puffs into the lungs every 6 (six) hours as needed (for wheezing/shortness of breath). ) 1 each 5  . amLODipine (NORVASC) 10 MG tablet TAKE 1 TABLET BY MOUTH ONCE DAILY 90 tablet 1  . anastrozole (ARIMIDEX) 1 MG tablet Take 1 tablet (1 mg total) by mouth daily. 90 tablet 4  . atorvastatin (LIPITOR) 80 MG tablet Take 1 tablet (80 mg total) by mouth daily at 6 PM. 90 tablet 1  . Cholecalciferol (VITAMIN D3) 2000 units TABS Take 4,000 Units by mouth daily.    [name] denosumab (PROLIA) 60 MG/ML SOLN injection Inject 60 mg into the skin every 6 (six) months. Administer in upper arm, thigh, or abdomen    . Fluticasone-Umeclidin-Vilant (TRELEGY ELLIPTA) 100-62.5-25 MCG/INH AEPB Inhale 62.5 mcg into the lungs daily. 1 each 5  . hydrochlorothiazide (HYDRODIURIL) 25 MG tablet Take 1 tablet (25 mg total) by mouth daily. 90 tablet 3  . ipratropium-albuterol (DUONEB) 0.5-2.5 (3) MG/3ML SOLN USE ONE VIAL IN NEBULIZER 4 TIMES DAILY (Patient taking differently: USE ONE VIAL IN NEBULIZER 4 TIMES DAILY) 360 mL 5  . metoprolol tartrate (LOPRESSOR) 50 MG tablet Take 50 mg (1 tablet) ONE hour prior to CT 1 tablet 0  . OXYGEN Inhale 2-3 L into the lungs continuous. When exerting self     . potassium chloride SA (K-DUR,KLOR-CON) 20 MEQ tablet Take 1 tablet (20 mEq total) by mouth 2 (two) times daily. (Patient taking differently: Take 20 mEq by mouth 2 (two) times a week. ) 180 tablet 1  . roflumilast (DALIRESP) 500 MCG TABS tablet Take 1 tablet (500 mcg total) by mouth daily. 30 tablet 11  . sodium chloride (OCEAN) 0.65 % SOLN nasal spray Place 1 spray into both nostrils as needed (dryness).    [name] tiZANidine (ZANAFLEX) 4 MG tablet Take 1 tablet (4 mg total) by mouth every 8 (eight) hours as needed for muscle spasms. 60 tablet 5  . arformoterol (BROVANA) 15 MCG/2ML NEBU Take 2 mLs (15 mcg total) by nebulization 2 (two) times daily. 180 mL 2  . budesonide (PULMICORT) 0.5 MG/2ML  nebulizer solution Take 2 mLs (0.5 mg total) by nebulization 2 (two) times daily. J44.9 120 mL 12  . predniSONE (DELTASONE) 10 MG tablet 4 tabs x 1 day, 3 tab x 1 day, 2 tab x 1 day, 1 tab x 1 day, 0.5 tab x 1 day then stop 11 tablet 0  . Revefenacin (YUPELRI) 175 MCG/3ML SOLN Inhale 1 vial into the lungs daily. j44.9 90 mL 5  . doxycycline (VIBRA-TABS) 100 MG tablet Take 1 tablet (100 mg total) by mouth 2 (two) times daily. (Patient  not taking: Reported on 04/17/2018) 10 tablet 0   No facility-administered medications prior to visit.      Review of Systems  Constitutional:   No  weight loss, night sweats,  Fevers, chills,  +fatigue, or  lassitude.  HEENT:   No headaches,  Difficulty swallowing,  Tooth/dental problems, or  Sore throat,                No sneezing, itching, ear ache, nasal congestion, post nasal drip,   CV:  No chest pain,  Orthopnea, PND, +swelling in lower extremities,  No anasarca, dizziness, palpitations, syncope.   GI  No heartburn, indigestion, abdominal pain, nausea, vomiting, diarrhea, change in bowel habits, loss of appetite, bloody stools.   Resp:   No chest wall deformity  Skin: no rash or lesions.  GU: no dysuria, change in color of urine, no urgency or frequency.  No flank pain, no hematuria   MS:  No joint pain or swelling.  No decreased range of motion.  No back pain.    Physical Exam  Ht 5\' 4"  (1.626 m)   Wt 208 lb (94.3 kg)   SpO2 98%   BMI 35.70 kg/m   GEN: A/Ox3; pleasant , NAD, elderly on O2    HEENT:  [place]/AT,  EACs-clear, TMs-wnl, NOSE-clear, THROAT-clear, no lesions, no postnasal drip or exudate noted.   NECK:  Supple w/ fair ROM; no JVD; normal carotid impulses w/o bruits; no thyromegaly or nodules palpated; no lymphadenopathy.    RESP  Decreased BS in bases no accessory muscle use, no dullness to percussion  CARD:  RRR, no m/r/g, tr -1+ peripheral edema, pulses intact, no cyanosis or clubbing.  GI:   Soft & nt; nml bowel sounds;  no organomegaly or masses detected.   Musco: Warm bil, no deformities or joint swelling noted.   Neuro: alert, no focal deficits noted.    Skin: Warm, no lesions or rashes    Lab Results:  CBC  BMET  BNP No results found for: BNP  ProBNP No results found for: PROBNP  Imaging: No results found.    PFT Results Latest Ref Rng & Units 04/15/2016 11/05/2015 10/21/2015  FVC-Pre L 1.41 1.50 1.66  FVC-Predicted Pre % 58 63 69  FVC-Post L 1.38 1.50 1.83  FVC-Predicted Post % 57 63 77  Pre FEV1/FVC % % 45 41 44  Post FEV1/FCV % % 47 46 48  FEV1-Pre L 0.64 0.62 0.73  FEV1-Predicted Pre % 34 33 39  FEV1-Post L 0.65 0.69 0.88  DLCO UNC% % 36 - -  DLCO COR %Predicted % 60 - -    No results found for: NITRICOXIDE      Assessment & Plan:   COPD, severe (HCC) Doing well with improved symptom control on TRELEGY   Plan  Patient Instructions  Continue on TRELEGY 1 puff daily  Continue on Daliresp. daily .  Continue oxygen 2l/m . (3l/m on POC) .  Follow up with Dr. Chase Caller in 3 months and As needed         Chronic respiratory failure with hypoxia ([place]) Continue on O2.      Rexene Edison, NP 04/17/2018

## 2018-04-17 NOTE — Patient Instructions (Signed)
Continue on TRELEGY 1 puff daily  Continue on Daliresp. daily .  Continue oxygen 2l/m . (3l/m on POC) .  Follow up with Dr. Chase Caller in 3 months and As needed

## 2018-04-17 NOTE — Assessment & Plan Note (Signed)
Continue on O2 

## 2018-04-19 DIAGNOSIS — G4733 Obstructive sleep apnea (adult) (pediatric): Secondary | ICD-10-CM | POA: Diagnosis not present

## 2018-05-01 ENCOUNTER — Other Ambulatory Visit: Payer: Self-pay | Admitting: Adult Health

## 2018-05-09 ENCOUNTER — Telehealth: Payer: Self-pay | Admitting: Internal Medicine

## 2018-05-09 MED ORDER — AZITHROMYCIN 250 MG PO TABS: ORAL_TABLET | ORAL | 0 refills | Status: AC

## 2018-05-09 NOTE — Telephone Encounter (Signed)
Saline nasal rinses As needed   mucinex dm Twice daily  As needed   If not improving with discolored mucus ,Zpack to have on hold #1 , take as directed.  No refills.  Please contact office for sooner follow up if symptoms do not improve or worsen or seek emergency care

## 2018-05-09 NOTE — Telephone Encounter (Addendum)
MR pt last seen TP on 04/17/18: Called and spoke to pt. Pt reports of chest/nasal congestion & prod cough with green mucus x4d Pt stated that she has been taking OTC expectorant and decongestant with mild improvement in congestion.  Pt has used proair once in the past week.  Denied fever, chills or sweats. Pt declined appt due to copay.   TP please advise. Thanks

## 2018-05-09 NOTE — Telephone Encounter (Signed)
Pt is aware of recommendation and voiced her understanding.  Rx for Zpak has been sent to [facility]. Pt requested that I not ask pharmacy to hold Rx, due to pharmacy be closed tomorrow and closing early today.  Pt stated that she would try mucinex d vs mucinex dm, due to the dm causing high blood pressure.  Nothing further is needed.   Will route to TP as an FYI.

## 2018-05-13 DIAGNOSIS — J449 Chronic obstructive pulmonary disease, unspecified: Secondary | ICD-10-CM | POA: Diagnosis not present

## 2018-05-19 DIAGNOSIS — G4733 Obstructive sleep apnea (adult) (pediatric): Secondary | ICD-10-CM | POA: Diagnosis not present

## 2018-05-22 ENCOUNTER — Ambulatory Visit: Payer: Medicare Other | Admitting: Cardiovascular Disease

## 2018-05-24 ENCOUNTER — Other Ambulatory Visit: Payer: Medicare Other

## 2018-05-24 ENCOUNTER — Ambulatory Visit: Payer: Medicare Other | Admitting: Adult Health

## 2018-06-09 NOTE — Progress Notes (Signed)
Subjective:   Victoria [name] is a 73 y.o. female who presents for Medicare Annual (Subsequent) preventive examination.  Review of Systems:  No ROS.  Medicare Wellness Visit. Additional risk factors are reflected in the social history.  Cardiac Risk Factors include: advanced age (>1men, >75 women);dyslipidemia;hypertension;obesity (BMI >30kg/m2);diabetes mellitus Sleep patterns: feels rested on waking, gets up 1-2 times nightly to void and sleeps 7 hours nightly.    Home Safety/Smoke Alarms: Feels safe in home. Smoke alarms in place.  Living environment; residence and Firearm Safety: 1-story house/ trailer, oxygen , no firearms. Lives alone, no needs for DME, good support system Seat Belt Safety/Bike Helmet: Wears seat belt.      Objective:     Vitals: BP 136/82   Pulse 79   Resp 17   Ht 5\' 4"  (1.626 m)   Wt 215 lb (97.5 kg)   SpO2 98% Comment: 3L  BMI 36.90 kg/m   Body mass index is 36.9 kg/m.  Advanced Directives 06/12/2018 08/31/2017 08/10/2017 08/08/2017 06/07/2017 04/18/2017 02/23/2017  Does Patient Have a Medical Advance Directive? No Yes Yes Yes Yes No Yes  Type of Advance Directive - Living will Living will Living will [place];Living will - [place]  Does patient want to make changes to medical advance directive? - No - Patient declined No - Patient declined No - Patient declined - - -  Copy of [place] in Chart? - No - copy requested No - copy requested - No - copy requested - No - copy requested  Would patient like information on creating a medical advance directive? Yes (ED - Information included in AVS) - - - - No - Patient declined -    Tobacco Social History   Tobacco Use  Smoking Status Former Smoker  . Packs/day: 1.00  . Years: 50.00  . Pack years: 50.00  . Types: Cigarettes  . Last attempt to quit: 04/16/2011  . Years since quitting: 7.1  Smokeless Tobacco Never Used  Tobacco Comment   quit  that date when she had to go to ER      Counseling given: Not Answered Comment: quit that date when she had to go to ER   Past Medical History:  Diagnosis Date  . Abdominal aneurysm ([place])    Dr. Oneida Alar follows lLOV 2 ''17 per pt "around 2 cm"  . Anemia    as a child  . Arthritis    left ankle, right knee, right SI joint, wrists, lower back  . Breast cancer in female [facility])    Left  . COPD (chronic obstructive pulmonary disease) ([place])    ephysema-Dr. Chase Caller  . Dyspnea   . Headache    as a child would have terrible headaches during season changes  . 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 Hyperlipidemia   . Hypertension   . Multiple thyroid nodules   . Murmur, cardiac 1950  . Osteoporosis   . Pneumonia   . Pre-diabetes   . Requires continuous at home supplemental oxygen    2 L 24/7  . Sebaceous cyst    hairline sebaceous cyst left posterior neck to be excised 04-13-16 by Dr. Harlow Asa in [place] [facility]  . Sleep apnea    cpap used sometimes, uses oxygen concentrator 2 l/m nasally bedtime  . Varicella as child   Past Surgical History:  Procedure Laterality Date  . APPENDECTOMY     2008  .  BREAST LUMPECTOMY WITH RADIOACTIVE SEED LOCALIZATION Left 08/10/2017   Procedure: BREAST LUMPECTOMY WITH RADIOACTIVE SEED LOCALIZATION;  Surgeon: Rolm Bookbinder, MD;  Location: [facility];  Service: General;  Laterality: Left;  . [place]  . COLONOSCOPY    . COLONOSCOPY WITH PROPOFOL N/A 04/16/2016   Procedure: COLONOSCOPY WITH PROPOFOL;  Surgeon: Carol Ada, MD;  Location[facility];  Service: Endoscopy;  Laterality: N/A;  . CYST REMOVAL NECK Left 04/13/2016   Procedure: EXCISION OF SEBACEOUS CYST LEFT POSTERIOR NECK;  Surgeon: Armandina Gemma, MD;  Location: [facility];  Service: General;  Laterality: Left;  . Excision of Pelvic Absess, Right Ovary     2008  . RE-EXCISION OF BREAST CANCER,SUPERIOR MARGINS Left 08/31/2017   Procedure: RE-EXCISION OF LEFT  BREAST  MARGINS ERAS PATHWAY;  Surgeon: Rolm Bookbinder, MD;  Location: [facility];  Service: General;  Laterality: Left;  . TUBAL LIGATION  1980   Family History  Problem Relation Age of Onset  . Heart disease Mother        MI - fatal  . Hypertension Mother   . Stroke Father 21       fatal  . Alzheimer's disease Father   . Alzheimer's disease Brother   . Hyperlipidemia Brother   . Hypertension Brother   . Diabetes Brother   . Hypertension Brother   . Hyperlipidemia Brother   . Breast cancer Paternal Grandmother    Social History   Socioeconomic History  . Marital status: Divorced    Spouse name: Not on file  . Number of children: 1  . Years of education: 61  . Highest education level: Not on file  Occupational History  . Occupation: Social worker, Technical sales engineer: [employer]  Social Needs  . Financial resource strain: Not very hard  . Food insecurity:    Worry: Never true    Inability: Never true  . Transportation needs:    Medical: No    Non-medical: No  Tobacco Use  . Smoking status: Former Smoker    Packs/day: 1.00    Years: 50.00    Pack years: 50.00    Types: Cigarettes    Last attempt to quit: 04/16/2011    Years since quitting: 7.1  . Smokeless tobacco: Never Used  . Tobacco comment: quit that date when she had to go to ER   Substance and Sexual Activity  . Alcohol use: Yes    Alcohol/week: 0.0 standard drinks    Comment: rare occasion  . Drug use: No    Comment: no marijuana since 2012  . Sexual activity: Never  Lifestyle  . Physical activity:    Days per week: 0 days    Minutes per session: 0 min  . Stress: Only a little  Relationships  . Social connections:    Talks on phone: More than three times a week    Gets together: More than three times a week    Attends religious service: More than 4 times per year    Active member of club or organization: Yes    Attends meetings of clubs or organizations: More than 4 times per year    Relationship  status: Divorced  Other Topics Concern  . Not on file  Social History Narrative   HSG; [name], [place]. . Married '69 - 9 yrs/divorced. 1 son - '72; no grandchildren.   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 No h/o physical or sexual abuse.  ACP - no living will - wants information. Provided packet of information. On 08/05/2011: she stated she was distant cousins to celebrities [name] and [name]       Outpatient Encounter Medications as of 06/12/2018  Medication Sig  . acetaminophen (TYLENOL) 500 MG tablet Take 1,000 mg by mouth every 6 (six) hours as needed.  . Albuterol Sulfate (PROAIR RESPICLICK) 161 (90 Base) MCG/ACT AEPB Inhale 1-2 puffs into the lungs every 6 (six) hours as needed (for wheezing/shortness of breath).  [name] amLODipine (NORVASC) 10 MG tablet TAKE 1 TABLET BY MOUTH ONCE DAILY  . anastrozole (ARIMIDEX) 1 MG tablet Take 1 tablet (1 mg total) by mouth daily.  [name] atorvastatin (LIPITOR) 80 MG tablet Take 1 tablet (80 mg total) by mouth daily at 6 PM.  . Cholecalciferol (VITAMIN D3) 2000 units TABS Take 4,000 Units by mouth daily.  [name] denosumab (PROLIA) 60 MG/ML SOLN injection Inject 60 mg into the skin every 6 (six) months. Administer in upper arm, thigh, or abdomen  . Fluticasone-Umeclidin-Vilant (TRELEGY ELLIPTA) 100-62.5-25 MCG/INH AEPB Inhale 62.5 mcg into the lungs daily.  . hydrochlorothiazide (HYDRODIURIL) 25 MG tablet Take 1 tablet (25 mg total) by mouth daily.  [name] ipratropium-albuterol (DUONEB) 0.5-2.5 (3) MG/3ML SOLN USE ONE VIAL IN NEBULIZER 4 TIMES DAILY (Patient taking differently: USE ONE VIAL IN NEBULIZER 4 TIMES DAILY)  . OXYGEN Inhale 2-3 L into the lungs continuous. When exerting self   . potassium chloride SA (K-DUR,KLOR-CON) 20 MEQ tablet Take 1 tablet (20 mEq total) by mouth 2 (two) times daily.  . roflumilast (DALIRESP) 500 MCG TABS tablet Take 1 tablet (500 mcg total) by mouth daily.  .  sodium chloride (OCEAN) 0.65 % SOLN nasal spray Place 1 spray into both nostrils as needed (dryness).  . [DISCONTINUED] potassium chloride SA (K-DUR,KLOR-CON) 20 MEQ tablet Take 1 tablet (20 mEq total) by mouth 2 (two) times daily. (Patient taking differently: Take 20 mEq by mouth 2 (two) times a week. )  . [DISCONTINUED] metoprolol tartrate (LOPRESSOR) 50 MG tablet Take 50 mg (1 tablet) ONE hour prior to CT (Patient not taking: Reported on 06/12/2018)  . [DISCONTINUED] tiZANidine (ZANAFLEX) 4 MG tablet Take 1 tablet (4 mg total) by mouth every 8 (eight) hours as needed for muscle spasms. (Patient not taking: Reported on 06/12/2018)   No facility-administered encounter medications on file as of 06/12/2018.     Activities of Daily Living In your present state of health, do you have any difficulty performing the following activities: 06/12/2018 08/31/2017  Hearing? N N  Vision? N N  Difficulty concentrating or making decisions? N N  Walking or climbing stairs? N Y  Comment - -  Dressing or bathing? N N  Doing errands, shopping? N -  Preparing Food and eating ? N -  Using the Toilet? N -  In the past six months, have you accidently leaked urine? N -  Do you have problems with loss of bowel control? N -  Managing your Medications? N -  Managing your Finances? N -  Housekeeping or managing your Housekeeping? N -  Some recent data might be hidden    Patient Care Team: Janith Lima, MD as PCP - General (Internal Medicine) Brand Males, MD as Consulting Physician (Pulmonary Disease) Arvella Nigh, MD as Consulting Physician (Obstetrics and Gynecology) Magrinat, Virgie Dad, MD as Consulting Physician (Oncology) Rolm Bookbinder, MD as Consulting Physician (General Surgery) Gery Pray, MD as Consulting Physician (Radiation Oncology) Troy Sine, MD as  Consulting Physician (Cardiology) Delice Bison, Charlestine Massed, NP as Nurse Practitioner (Hematology and Oncology) Philemon Kingdom, MD  as Consulting Physician (Internal Medicine) Parrett, Fonnie Mu, NP as Nurse Practitioner (Pulmonary Disease)    Assessment:   This is a routine wellness examination for Kalinda. Physical assessment deferred to PCP.  Exercise Activities and Dietary recommendations Current Exercise Habits: The patient does not participate in regular exercise at present, Exercise limited by: respiratory conditions(s)  Diet (meal preparation, eat out, water intake, caffeinated beverages, dairy products, fruits and vegetables): in general, a "healthy" diet     Reviewed heart healthy and diabetic diet. Encouraged patient to increase daily water and healthy fluid intake. Discussed  Weight loss tip and diet. Relevant patient education assigned to patient using Emmi.  Goals    . Exercise 3x per week (30 min per time)     Will try to get up to go with dtr biw; at 6:45 to participate in swim class Pulmonary rehab is also monitoring exercise and tolerance; so the patient will know her limits Also discussed; weight bearing in the pool; assist osteo prevention     . 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 Continue to go to church and worship God. Eat health, exercise enjoy life and family    . Patient Stated     I want to lose weight and get diabetes under control by monitoring my diet and exercise by dancing around the house.       Fall Risk Fall Risk  06/07/2017 02/23/2016 04/07/2015 10/29/2014 10/07/2014  Falls in the past year? No No No No No   Depression Screen PHQ 2/9 Scores 06/07/2017 02/23/2016 04/07/2015 01/28/2015  PHQ - 2 Score 0 1 2 0  PHQ- 9 Score 0 - 5 -     Cognitive Function MMSE - Mini Mental State Exam 06/07/2017 10/29/2014  Not completed: - Unable to complete  Orientation to time 5 -  Orientation to Place 5 -  Registration 3 -  Attention/ Calculation 5 -  Recall 2 -  Language- name 2 objects 2 -  Language- repeat 1 -  Language- follow 3 step  command 3 -  Language- read & follow direction 1 -  Write a sentence 1 -  Copy design 1 -  Total score 29 -       Ad8 score reviewed for issues:  Issues making decisions: no  Less interest in hobbies / activities: no  Repeats questions, stories (family complaining): no  Trouble using ordinary gadgets (microwave, computer, phone):no  Forgets the month or year: no  Mismanaging finances: no  Remembering appts: no  Daily problems with thinking and/or memory: no Ad8 score is= 0  Immunization History  Administered Date(s) Administered  . Influenza Split 01/16/2011  . Influenza, High Dose Seasonal PF 01/13/2017, 02/09/2018  . Influenza,inj,Quad PF,6+ Mos 02/01/2013, 02/27/2014, 04/07/2015, 01/08/2016  . Pneumococcal Conjugate-13 02/01/2013  . Pneumococcal Polysaccharide-23 10/29/2014  . Td 12/06/2011   Screening Tests Health Maintenance  Topic Date Due  . OPHTHALMOLOGY EXAM  01/29/1956  . MAMMOGRAM  01/15/2017  . URINE MICROALBUMIN  01/18/2018  . HEMOGLOBIN A1C  03/02/2018  . FOOT EXAM  06/07/2018  . TETANUS/TDAP  12/05/2021  . COLONOSCOPY  04/16/2026  . INFLUENZA VACCINE  Completed  . DEXA SCAN  Completed  . Hepatitis C Screening  Completed  . PNA vac Low Risk Adult  Completed      Plan:  Reviewed health maintenance screenings with patient today and relevant education, vaccines, and/or referrals were provided.   Resource for support group for Type 2 diabetics and their family members addresses a wide range of topics related to diabetes. Registration Details Call [phone] for more information. Fees & Payment This program is free.  Please ask Dr. Radene Knee to send mammogram results scheduled in February to Dr. Ronnald Ramp   Call  and schedule eye exam With Dr. Barbaraann Cao, [place] [id number] [phone]  Continue doing brain stimulating activities (puzzles, reading, adult coloring books, staying active) to keep memory sharp.   Continue to eat heart healthy  diet (full of fruits, vegetables, whole grains, lean protein, water--limit salt, fat, and sugar intake) and increase physical activity as tolerated.  I have personally reviewed and noted the following in the patient's chart:   . Medical and social history . Use of alcohol, tobacco or illicit drugs  . Current medications and supplements . Functional ability and status . Nutritional status . Physical activity . Advanced directives . List of other physicians . Vitals . Screenings to include cognitive, depression, and falls . Referrals and appointments  In addition, I have reviewed and discussed with patient certain preventive protocols, quality metrics, and best practice recommendations. A written personalized care plan for preventive services as well as general preventive health recommendations were provided to patient.     Michiel Cowboy, RN  06/12/2018

## 2018-06-12 ENCOUNTER — Ambulatory Visit: Payer: Medicare Other | Admitting: *Deleted

## 2018-06-12 VITALS — BP 136/82 | HR 79 | Resp 17 | Ht 64.0 in | Wt 215.0 lb

## 2018-06-12 DIAGNOSIS — E118 Type 2 diabetes mellitus with unspecified complications: Secondary | ICD-10-CM

## 2018-06-12 DIAGNOSIS — E876 Hypokalemia: Secondary | ICD-10-CM

## 2018-06-12 MED ORDER — POTASSIUM CHLORIDE CRYS ER 20 MEQ PO TBCR: 20.0000 meq | EXTENDED_RELEASE_TABLET | Freq: Two times a day (BID) | ORAL | 1 refills | Status: DC

## 2018-06-12 NOTE — Patient Instructions (Addendum)
Continue doing brain stimulating activities (puzzles, reading, adult coloring books, staying active) to keep memory sharp.   Continue to eat heart healthy diet (full of fruits, vegetables, whole grains, lean protein, water--limit salt, fat, and sugar intake) and increase physical activity as tolerated.  Please ask Dr. Radene Knee to send mammogram results to Dr. Ronnald Ramp   Call Dr. Bing Plume and schedule eye exam With Dr. Phineas Real  Pt scheduled with [facility] [place], [place] [address] [phone] Dr. Phineas Real did exam  This support group for Type 2 diabetics and their family members addresses a wide range of topics related to diabetes. Registration Details Call [phone] for more information. Fees & Payment This program is free. [name] Description . Schedule & Location . Related Events This is a free support group for individuals with Type 2 Diabetes and their family members. The group meets from 6 to 7 p.m. on the second Monday of each month. Check-in is at 5:45 p.m. The meetings are held at [facility] located at [facility] at [employer], [place], fourth floor conference room.  Type 1 Diabetes support group first Wednesday of every month 6-7 PM [phone] Please arrive 10 minutes early [address]  Ms. Ronnald Ramp , Thank you for taking time to come for your Medicare Wellness Visit. I appreciate your ongoing commitment to your health goals. Please review the following plan we discussed and let me know if I can assist you in the future.   These are the goals we discussed: Goals    . Exercise 3x per week (30 min per time)     Will try to get up to go with dtr biw; at 6:45 to participate in swim class Pulmonary rehab is also monitoring exercise and tolerance; so the patient will know her limits Also discussed; weight bearing in the pool; assist osteo prevention     .  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 Continue to go to church and worship God. Eat health, exercise enjoy life and family    . Patient Stated     I want to lose weight and get diabetes under control by monitoring my diet and exercise by dancing around the house.       This is a list of the screening recommended for you and due dates:  Health Maintenance  Topic Date Due  . Eye exam for diabetics  01/29/1956  . Mammogram  01/15/2017  . Urine Protein Check  01/18/2018  . Hemoglobin A1C  03/02/2018  . Complete foot exam   06/07/2018  . Tetanus Vaccine  12/05/2021  . Colon Cancer Screening  04/16/2026  . Flu Shot  Completed  . DEXA scan (bone density measurement)  Completed  .  Hepatitis C: One time screening is recommended by Center for Disease Control  (CDC) for  adults born from [birth year] through 1967.   Completed  . Pneumonia vaccines  Completed   Health Maintenance, Female Adopting a healthy lifestyle and getting preventive care can go a long way to promote health and wellness. Talk with your health care provider about what schedule of regular examinations is right for you. This is a good chance for you to check in with your provider about disease prevention and staying healthy. In between checkups, there are plenty of things you can do on your own. Experts have  done a lot of research about which lifestyle changes and preventive measures are most likely to keep you healthy. Ask your health care provider for more information. Weight and diet Eat a healthy diet  Be sure to include plenty of vegetables, fruits, low-fat dairy products, and lean protein.  Do not eat a lot of foods high in solid fats, added sugars, or salt.  Get regular exercise. This is one of the most important things you can do for your health. ? Most adults should exercise for at least 150 minutes each week. The exercise should increase your heart rate and make you sweat  (moderate-intensity exercise). ? Most adults should also do strengthening exercises at least twice a week. This is in addition to the moderate-intensity exercise. Maintain a healthy weight  Body mass index (BMI) is a measurement that can be used to identify possible weight problems. It estimates body fat based on height and weight. Your health care provider can help determine your BMI and help you achieve or maintain a healthy weight.  For females 53 years of age and older: ? A BMI below 18.5 is considered underweight. ? A BMI of 18.5 to 24.9 is normal. ? A BMI of 25 to 29.9 is considered overweight. ? A BMI of 30 and above is considered obese. Watch levels of cholesterol and blood lipids  You should start having your blood tested for lipids and cholesterol at 75 years of age, then have this test every 5 years.  You may need to have your cholesterol levels checked more often if: ? Your lipid or cholesterol levels are high. ? You are older than 75 years of age. ? You are at high risk for heart disease. Cancer screening Lung Cancer  Lung cancer screening is recommended for adults 69-31 years old who are at high risk for lung cancer because of a history of smoking.  A yearly low-dose CT scan of the lungs is recommended for people who: ? Currently smoke. ? Have quit within the past 15 years. ? Have at least a 30-pack-year history of smoking. A pack year is smoking an average of one pack of cigarettes a day for 1 year.  Yearly screening should continue until it has been 15 years since you quit.  Yearly screening should stop if you develop a health problem that would prevent you from having lung cancer treatment. Breast Cancer  Practice breast self-awareness. This means understanding how your breasts normally appear and feel.  It also means doing regular breast self-exams. Let your health care provider know about any changes, no matter how small.  If you are in your 20s or 30s, you  should have a clinical breast exam (CBE) by a health care provider every 1-3 years as part of a regular health exam.  If you are 57 or older, have a CBE every year. Also consider having a breast X-ray (mammogram) every year.  If you have a family history of breast cancer, talk to your health care provider about genetic screening.  If you are at high risk for breast cancer, talk to your health care provider about having an MRI and a mammogram every year.  Breast cancer gene (BRCA) assessment is recommended for women who have family members with BRCA-related cancers. BRCA-related cancers include: ? Breast. ? Ovarian. ? Tubal. ? Peritoneal cancers.  Results of the assessment will determine the need for genetic counseling and BRCA1 and BRCA2 testing. Cervical Cancer Your health care provider may recommend that you be  screened regularly for cancer of the pelvic organs (ovaries, uterus, and vagina). This screening involves a pelvic examination, including checking for microscopic changes to the surface of your cervix (Pap test). You may be encouraged to have this screening done every 3 years, beginning at age 98.  For women ages 23-65, health care providers may recommend pelvic exams and Pap testing every 3 years, or they may recommend the Pap and pelvic exam, combined with testing for human papilloma virus (HPV), every 5 years. Some types of HPV increase your risk of cervical cancer. Testing for HPV may also be done on women of any age with unclear Pap test results.  Other health care providers may not recommend any screening for nonpregnant women who are considered low risk for pelvic cancer and who do not have symptoms. Ask your health care provider if a screening pelvic exam is right for you.  If you have had past treatment for cervical cancer or a condition that could lead to cancer, you need Pap tests and screening for cancer for at least 20 years after your treatment. If Pap tests have been  discontinued, your risk factors (such as having a new sexual partner) need to be reassessed to determine if screening should resume. Some women have medical problems that increase the chance of getting cervical cancer. In these cases, your health care provider may recommend more frequent screening and Pap tests. Colorectal Cancer  This type of cancer can be detected and often prevented.  Routine colorectal cancer screening usually begins at 75 years of age and continues through 75 years of age.  Your health care provider may recommend screening at an earlier age if you have risk factors for colon cancer.  Your health care provider may also recommend using home test kits to check for hidden blood in the stool.  A small camera at the end of a tube can be used to examine your colon directly (sigmoidoscopy or colonoscopy). This is done to check for the earliest forms of colorectal cancer.  Routine screening usually begins at age 29.  Direct examination of the colon should be repeated every 5-10 years through 75 years of age. However, you may need to be screened more often if early forms of precancerous polyps or small growths are found. Skin Cancer  Check your skin from head to toe regularly.  Tell your health care provider about any new moles or changes in moles, especially if there is a change in a mole's shape or color.  Also tell your health care provider if you have a mole that is larger than the size of a pencil eraser.  Always use sunscreen. Apply sunscreen liberally and repeatedly throughout the day.  Protect yourself by wearing long sleeves, pants, a wide-brimmed hat, and sunglasses whenever you are outside. Heart disease, diabetes, and high blood pressure  High blood pressure causes heart disease and increases the risk of stroke. High blood pressure is more likely to develop in: ? People who have blood pressure in the high end of the normal range (130-139/85-89 mm Hg). ? People  who are overweight or obese. ? People who are African American.  If you are 57-51 years of age, have your blood pressure checked every 3-5 years. If you are 45 years of age or older, have your blood pressure checked every year. You should have your blood pressure measured twice-once when you are at a hospital or clinic, and once when you are not at a hospital or clinic. Record  the average of the two measurements. To check your blood pressure when you are not at a hospital or clinic, you can use: ? An automated blood pressure machine at a pharmacy. ? A home blood pressure monitor.  If you are between 8 years and 32 years old, ask your health care provider if you should take aspirin to prevent strokes.  Have regular diabetes screenings. This involves taking a blood sample to check your fasting blood sugar level. ? If you are at a normal weight and have a low risk for diabetes, have this test once every three years after 75 years of age. ? If you are overweight and have a high risk for diabetes, consider being tested at a younger age or more often. Preventing infection Hepatitis B  If you have a higher risk for hepatitis B, you should be screened for this virus. You are considered at high risk for hepatitis B if: ? You were born in a country where hepatitis B is common. Ask your health care provider which countries are considered high risk. ? Your parents were born in a high-risk country, and you have not been immunized against hepatitis B (hepatitis B vaccine). ? You have HIV or AIDS. ? You use needles to inject street drugs. ? You live with someone who has hepatitis B. ? You have had sex with someone who has hepatitis B. ? You get hemodialysis treatment. ? You take certain medicines for conditions, including cancer, organ transplantation, and autoimmune conditions. Hepatitis C  Blood testing is recommended for: ? Everyone born from [birth year] through 1967. ? Anyone with known risk factors for  hepatitis C. Sexually transmitted infections (STIs)  You should be screened for sexually transmitted infections (STIs) including gonorrhea and chlamydia if: ? You are sexually active and are younger than 75 years of age. ? You are older than 75 years of age and your health care provider tells you that you are at risk for this type of infection. ? Your sexual activity has changed since you were last screened and you are at an increased risk for chlamydia or gonorrhea. Ask your health care provider if you are at risk.  If you do not have HIV, but are at risk, it may be recommended that you take a prescription medicine daily to prevent HIV infection. This is called pre-exposure prophylaxis (PrEP). You are considered at risk if: ? You are sexually active and do not regularly use condoms or know the HIV status of your partner(s). ? You take drugs by injection. ? You are sexually active with a partner who has HIV. Talk with your health care provider about whether you are at high risk of being infected with HIV. If you choose to begin PrEP, you should first be tested for HIV. You should then be tested every 3 months for as long as you are taking PrEP. Pregnancy  If you are premenopausal and you may become pregnant, ask your health care provider about preconception counseling.  If you may become pregnant, take 400 to 800 micrograms (mcg) of folic acid every day.  If you want to prevent pregnancy, talk to your health care provider about birth control (contraception). Osteoporosis and menopause  Osteoporosis is a disease in which the bones lose minerals and strength with aging. This can result in serious bone fractures. Your risk for osteoporosis can be identified using a bone density scan.  If you are 67 years of age or older, or if you are at risk for  osteoporosis and fractures, ask your health care provider if you should be screened.  Ask your health care provider whether you should take a calcium  or vitamin D supplement to lower your risk for osteoporosis.  Menopause may have certain physical symptoms and risks.  Hormone replacement therapy may reduce some of these symptoms and risks. Talk to your health care provider about whether hormone replacement therapy is right for you. Follow these instructions at home:  Schedule regular health, dental, and eye exams.  Stay current with your immunizations.  Do not use any tobacco products including cigarettes, chewing tobacco, or electronic cigarettes.  If you are pregnant, do not drink alcohol.  If you are breastfeeding, limit how much and how often you drink alcohol.  Limit alcohol intake to no more than 1 drink per day for nonpregnant women. One drink equals 12 ounces of beer, 5 ounces of wine, or 1 ounces of hard liquor.  Do not use street drugs.  Do not share needles.  Ask your health care provider for help if you need support or information about quitting drugs.  Tell your health care provider if you often feel depressed.  Tell your health care provider if you have ever been abused or do not feel safe at home. This information is not intended to replace advice given to you by your health care provider. Make sure you discuss any questions you have with your health care provider. Document Released: 11/16/2010 Document Revised: 10/09/2015 Document Reviewed: 02/04/2015 Elsevier Interactive Patient Education  2019 [employer].

## 2018-06-13 ENCOUNTER — Ambulatory Visit: Payer: Medicare Other

## 2018-06-13 DIAGNOSIS — J449 Chronic obstructive pulmonary disease, unspecified: Secondary | ICD-10-CM | POA: Diagnosis not present

## 2018-06-20 DIAGNOSIS — G4733 Obstructive sleep apnea (adult) (pediatric): Secondary | ICD-10-CM | POA: Diagnosis not present

## 2018-06-21 DIAGNOSIS — C50419 Malignant neoplasm of upper-outer quadrant of unspecified female breast: Secondary | ICD-10-CM | POA: Diagnosis not present

## 2018-07-14 DIAGNOSIS — J449 Chronic obstructive pulmonary disease, unspecified: Secondary | ICD-10-CM | POA: Diagnosis not present

## 2018-07-17 ENCOUNTER — Other Ambulatory Visit: Payer: Self-pay | Admitting: Obstetrics and Gynecology

## 2018-07-17 DIAGNOSIS — Z853 Personal history of malignant neoplasm of breast: Secondary | ICD-10-CM

## 2018-07-18 DIAGNOSIS — Z124 Encounter for screening for malignant neoplasm of cervix: Secondary | ICD-10-CM | POA: Diagnosis not present

## 2018-07-20 ENCOUNTER — Telehealth: Payer: Self-pay | Admitting: Internal Medicine

## 2018-07-20 ENCOUNTER — Ambulatory Visit: Payer: Medicare Other | Admitting: Internal Medicine

## 2018-07-20 ENCOUNTER — Encounter: Payer: Self-pay | Admitting: Internal Medicine

## 2018-07-20 VITALS — BP 144/72 | HR 84 | Ht 64.0 in | Wt 216.0 lb

## 2018-07-20 DIAGNOSIS — G4733 Obstructive sleep apnea (adult) (pediatric): Secondary | ICD-10-CM | POA: Diagnosis not present

## 2018-07-20 DIAGNOSIS — J441 Chronic obstructive pulmonary disease with (acute) exacerbation: Secondary | ICD-10-CM | POA: Diagnosis not present

## 2018-07-20 DIAGNOSIS — M2550 Pain in unspecified joint: Secondary | ICD-10-CM

## 2018-07-20 DIAGNOSIS — Z122 Encounter for screening for malignant neoplasm of respiratory organs: Secondary | ICD-10-CM

## 2018-07-20 DIAGNOSIS — Z129 Encounter for screening for malignant neoplasm, site unspecified: Secondary | ICD-10-CM | POA: Diagnosis not present

## 2018-07-20 DIAGNOSIS — J449 Chronic obstructive pulmonary disease, unspecified: Secondary | ICD-10-CM

## 2018-07-20 DIAGNOSIS — J9611 Chronic respiratory failure with hypoxia: Secondary | ICD-10-CM | POA: Diagnosis not present

## 2018-07-20 MED ORDER — DOXYCYCLINE HYCLATE 100 MG PO TABS: 100.0000 mg | ORAL_TABLET | Freq: Two times a day (BID) | ORAL | 0 refills | Status: DC

## 2018-07-20 MED ORDER — PREDNISONE 10 MG PO TABS: ORAL_TABLET | ORAL | 0 refills | Status: DC

## 2018-07-20 NOTE — Addendum Note (Signed)
Addended by: Suzzanne Cloud E on: 07/20/2018 03:45 PM   Modules accepted: Orders

## 2018-07-20 NOTE — Addendum Note (Signed)
Addended by: Lorretta Harp on: 07/20/2018 03:30 PM   Modules accepted: Orders

## 2018-07-20 NOTE — Addendum Note (Signed)
Addended by: Lorretta Harp on: 07/20/2018 03:33 PM   Modules accepted: Orders

## 2018-07-20 NOTE — Telephone Encounter (Signed)
Called and left message with Primitivo Gauze, [place], [phone], to call back.

## 2018-07-20 NOTE — Patient Instructions (Addendum)
ICD-10-CM   1. COPD with acute exacerbation ([place]) J44.1   2. COPD, severe ([place]) J44.9 Pulmonary function test  3. Chronic respiratory failure with hypoxia ([facility]) J96.11   4. Cancer screening Z12.9   5. Encounter for screening for malignant neoplasm of respiratory organs Z12.2   6. Arthralgia, unspecified joint A26.33 Cyclic citrul peptide antibody, IgG    Rheumatoid Factor    Antinuclear Antib (ANA)    Take doxycycline 100mg  po twice daily x 5 days; take after meals and avoid sunlight  Please take prednisone 40 mg x1 day, then 30 mg x1 day, then 20 mg x1 day, then 10 mg x1 day, and then 5 mg x1 day and stop  Continue 2-3L [place] Continue daliresp Continue trelegy  In June 2020  - do spirometry and dlco in June 2020  - do CT low dose screening in June 2020  Follow social distancing for COVID-19 preparedness  Do ANA, CCP and RF for morning stiffness and arthritis/arthralgia  Followup - June 2020 after tests  - return sooner if needed

## 2018-07-20 NOTE — Progress Notes (Addendum)
OV 07/20/2017  Chief Complaint  Patient presents with  . Follow-up    Follow up visit. Pt also needing surgical clearance for lumpectomy.  Pt saw RB beginning February annd was givien prednisone. Pt states breathing has been tolerable with not taking prednisone. DME: [employer], 2L continuous at home 3L pulse when out     Follow-up severe COPD patient with obesity and sleep apnea.  She says she is compliant with all her COPD inhalers.  She is also compliant with her CPAP.  She is not smoking anymore.  Overall COPD CAT score is 16.  She uses oxygen all the time and CPAP at night.  She is due for breast surgery lumpectomy and is here for a preoperative evaluation.  She tells me that she is in good nutritional status.  She is not anemic.  She has got good kidney function.  She says the surgery will be elective and less than 2 hours and does not involve operating on the neck or the chest or the abdomen.  It will be done at [facility] and under experience surgeon.  Overall she is very functional and active despite his severe COPD.   OV 09/14/2017  Chief Complaint  Patient presents with  . Follow-up    Pt states she has been doing better since her visit with Dr. Halford Chessman last week. Pt is currently taking an abx and just finished last dose of prednisone today. Pt's cough has become better and is still wheezing some. SOB is about the same.    Follow-up advanced COPD with obesity and sleep apnea and chronic hypoxemic respiratory failure  I last saw her in March 2019 which itself followed in exacerbation.  Since then she has had 2 visits in my office with 2 providers for COPD exacerbation.  Most recently September 09, 2017.  Chest x-ray reviewed but did not visualize and the x-ray is clear.  [Of note she had low-dose CT scan of the chest May 2018 and is due for one-year CT scan coming up anytime now] her baseline COPD CAT scan.  She is using oxygen between 2-3 L.  She feels her exacerbation is slowly  resolving but she is still symptomatic COPD CAT score is 24 and much worse than baseline although this is improved compared to last week.  She did have breast cancer surgery recently and this went well.  Currently breast cancer in remission.  Review of her medication shows that while she was in the hospital nebulized bronchodilator long-acting beta agonist was tried and she really liked it.  Therefore on September 09, 2017 visit her combination inhaler Stiolto was changed to [place] she is here to start this.and Yupelri.  Review of her chart shows that she is not on inhaled corticosteroid.  Sometime back she was on Arnuity but she is not taking it due to lack of perceived benefit.  But now she is having recurrent exacerbations.  Therefore she is open to starting it again     OV 02/09/2018  Subjective:  Patient ID: Victoria [name], female , DOB: [birth year] , age 49 y.o. , MRN: [id number] , ADDRESS: [address] [place] [id number]   02/09/2018 -   Chief Complaint  Patient presents with  . Follow-up    Pt states she has been okay since last visit. States she is still having problems becoming SOB when she exerts herself and has occ cough when she has the SOB. Denies any complaints of wheezing or  CP. Pt does develop chest tightness when she has problems with SOB.     HPI Victoria [name] 72 y.o. -with advanced COPD and chronic hypoxemic respiratory failure.  Presents for routine follow-up.  Few weeks ago she had an exacerbation recall an antibiotic and prednisone.  After that she is improved back to her baseline with COPD CAT score shown below 15 and the symptomatology described.  She has new onset of pedal edema for the last 3 days.  This is associated with increased dietary intake of soda and also salted chips.  There is no fever or rash or wheezing.  Her COPD medications include nebulizers.  She was changed to the sometime back by nurse practitioner.  She is asking about going back to inhaler  particularly Stiolto.  She is willing to try Trelegy         OV 07/20/2018  Subjective:  Patient ID: Victoria [name], female , DOB: 1945-09-03 , age 73 y.o. , MRN: [id number] , ADDRESS: [address] [place] [address]   07/20/2018 -   Chief Complaint  Patient presents with  . Follow-up    Pt states her breathing has become worse since last visit, has an occ dry cough, and also has some tightness in chest.   Follow-up) COPD with chronic hypoxemic respiratory failure.  On Trelegy, oxygen 2 L and Daliresp.  HPI Victoria [name] 73 y.o. -presents for routine follow-up.  Since last seeing me he saw a nurse practitioner for follow-up.  She tells me for the last 3 days she is has increased shortness of breath and increased chest tightness and wheezing but no increase in cough or sputum production.  This fits in with a slight increase in COPD CAT score to 18.  She takes 2 L of oxygen at home and 3 L with a portable system.  She continues on Daliresp without problems and also Trelegy inhaler.  Viral exposure or travel history -   Is negative.  Last CT scan of the chest was in June 2019 showed only emphysema.  This was for lung cancer screening.  Last pulmonary function test was in 2017.  Most recent blood work reviewed was in October 2019 and normal.   Also c/p bad knees and morning stiffness and arthralgia/arthritis for long tme. No dx opf RA  CAT COPD Symptom & Quality of Life Score (GSK trademark) 0 is no burden. 5 is highest burden 07/20/2017  09/14/2017  02/09/2018  07/20/2018   Never Cough -> Cough all the time 2 3 2 2   No phlegm in chest -> Chest is full of phlegm 1 2 2 2   No chest tightness -> Chest feels very tight 2 3 2 2   No dyspnea for 1 flight stairs/hill -> Very dyspneic for 1 flight of stairs 4 5 4 3   No limitations for ADL at home -> Very limited with ADL at home 2 3 2 3   Confident leaving home -> Not at all confident leaving home 0 3 0 2  Sleep soundly -> Do not sleep soundly  because of lung condition 2 2 1 1   Lots of Energy -> No energy at all 3 3 2 3   TOTAL Score (max 40)  16 24 15 18      Results for NOLAH, KRENZER (MRN [id number]) as of 07/20/2017 10:59  Ref. Range 04/15/2016 13:02  FEV1-Post Latest Units: L 0.65  FEV1-%Pred-Post Latest Units: % 34  FEV1-%Change-Post Latest Units: % 1   Results for  ASHTYN, MELAND (MRN [id number]) as of 07/20/2017 10:59  Ref. Range 04/15/2016 13:02  DLCO cor Latest Units: ml/min/mmHg 9.47  DLCO cor % pred Latest Units: % 37    ROS - per HPI     has a past medical history of Abdominal aneurysm ([place]), Anemia, Arthritis, Breast cancer in female [facility]), COPD (chronic obstructive pulmonary disease) ([place]), Dyspnea, Headache, Heart murmur, Hyperlipidemia, Hypertension, Multiple thyroid nodules, Murmur, cardiac (1950), Osteoporosis, Pneumonia, Pre-diabetes, Requires continuous at home supplemental oxygen, Sebaceous cyst, Sleep apnea, and Varicella (as child).   reports that she quit smoking about 7 years ago. Her smoking use included cigarettes. She has a 50.00 pack-year smoking history. She has never used smokeless tobacco.  Past Surgical History:  Procedure Laterality Date  . APPENDECTOMY     2008  . BREAST LUMPECTOMY WITH RADIOACTIVE SEED LOCALIZATION Left 08/10/2017   Procedure: BREAST LUMPECTOMY WITH RADIOACTIVE SEED LOCALIZATION;  Surgeon: Rolm Bookbinder, MD;  Location: [place];  Service: General;  Laterality: Left;  . [place]  . COLONOSCOPY    . COLONOSCOPY WITH PROPOFOL N/A 04/16/2016   Procedure: COLONOSCOPY WITH PROPOFOL;  Surgeon: Carol Ada, MD;  Location: [facility];  Service: Endoscopy;  Laterality: N/A;  . CYST REMOVAL NECK Left 04/13/2016   Procedure: EXCISION OF SEBACEOUS CYST LEFT POSTERIOR NECK;  Surgeon: Armandina Gemma, MD;  Location: [place];  Service: General;  Laterality: Left;  . Excision of Pelvic Absess, Right Ovary     2008  . RE-EXCISION OF BREAST CANCER,SUPERIOR MARGINS Left  08/31/2017   Procedure: RE-EXCISION OF LEFT  BREAST MARGINS ERAS PATHWAY;  Surgeon: Rolm Bookbinder, MD;  Location: [place];  Service: General;  Laterality: Left;  . TUBAL LIGATION  1980    Allergies  Allergen Reactions  . Benicar [Olmesartan] Swelling    Swelling of face and arms   . [name] [Valsartan] Swelling    Swelling of face and arms   . Hydrocodone-Acetaminophen Nausea And Vomiting    Severe vomiting  . Lisinopril Cough  . Monosodium Glutamate Other (See Comments)    Facial swelling per pt  . Codeine Other (See Comments)    jittery  . Lead Acetate Rash  . Nickel Rash    Severe rash to infection: pt is allergic to all metals other than sterling silver or gold jewelry.     Immunization History  Administered Date(s) Administered  . Influenza Split 01/16/2011  . Influenza, High Dose Seasonal PF 01/13/2017, 02/09/2018  . Influenza,inj,Quad PF,6+ Mos 02/01/2013, 02/27/2014, 04/07/2015, 01/08/2016  . Pneumococcal Conjugate-13 02/01/2013  . Pneumococcal Polysaccharide-23 10/29/2014  . Td 12/06/2011    Family History  Problem Relation Age of Onset  . Heart disease Mother        MI - fatal  . Hypertension Mother   . Stroke Father 52       fatal  . Alzheimer's disease Father   . Alzheimer's disease Brother   . Hyperlipidemia Brother   . Hypertension Brother   . Diabetes Brother   . Hypertension Brother   . Hyperlipidemia Brother   . Breast cancer Paternal Grandmother      Current Outpatient Medications:  .  acetaminophen (TYLENOL) 500 MG tablet, Take 1,000 mg by mouth every 6 (six) hours as needed., Disp: , Rfl:  .  Albuterol Sulfate (PROAIR RESPICLICK) 741 (90 Base) MCG/ACT AEPB, Inhale 1-2 puffs into the lungs every 6 (six) hours as needed (for wheezing/shortness of breath)., Disp: 1 each, Rfl: 0 .  amLODipine (NORVASC) 10 MG tablet, TAKE 1 TABLET BY MOUTH ONCE DAILY, Disp: 90 tablet, Rfl: 1 .  anastrozole (ARIMIDEX) 1 MG tablet, Take 1 tablet (1 mg total) by  mouth daily., Disp: 90 tablet, Rfl: 4 .  atorvastatin (LIPITOR) 80 MG tablet, Take 1 tablet (80 mg total) by mouth daily at 6 PM., Disp: 90 tablet, Rfl: 1 .  Cholecalciferol (VITAMIN D3) 2000 units TABS, Take 4,000 Units by mouth daily., Disp: , Rfl:  .  denosumab (PROLIA) 60 MG/ML SOLN injection, Inject 60 mg into the skin every 6 (six) months. Administer in upper arm, thigh, or abdomen, Disp: , Rfl:  .  Fluticasone-Umeclidin-Vilant (TRELEGY ELLIPTA) 100-62.5-25 MCG/INH AEPB, Inhale 62.5 mcg into the lungs daily., Disp: 1 each, Rfl: 5 .  furosemide (LASIX) 20 MG tablet, furosemide 20 mg tablet  TAKE 1 TABLET BY MOUTH ONCE DAILY, Disp: , Rfl:  .  ipratropium-albuterol (DUONEB) 0.5-2.5 (3) MG/3ML SOLN, USE ONE VIAL IN NEBULIZER 4 TIMES DAILY (Patient taking differently: USE ONE VIAL IN NEBULIZER 4 TIMES DAILY), Disp: 360 mL, Rfl: 5 .  OXYGEN, Inhale 2-3 L into the lungs continuous. When exerting self , Disp: , Rfl:  .  potassium chloride SA (K-DUR,KLOR-CON) 20 MEQ tablet, Take 1 tablet (20 mEq total) by mouth 2 (two) times daily., Disp: 180 tablet, Rfl: 1 .  roflumilast (DALIRESP) 500 MCG TABS tablet, Take 1 tablet (500 mcg total) by mouth daily., Disp: 30 tablet, Rfl: 11 .  sodium chloride (OCEAN) 0.65 % SOLN nasal spray, Place 1 spray into both nostrils as needed (dryness)., Disp: , Rfl:  .  doxycycline (VIBRA-TABS) 100 MG tablet, Take 1 tablet (100 mg total) by mouth 2 (two) times daily., Disp: 10 tablet, Rfl: 0 .  hydrochlorothiazide (HYDRODIURIL) 25 MG tablet, Take 1 tablet (25 mg total) by mouth daily. (Patient not taking: Reported on 07/20/2018), Disp: 90 tablet, Rfl: 3 .  predniSONE (DELTASONE) 10 MG tablet, Take 4tabs x1day, 3tabs x1day,2tabs x1day, 1tab x1day, 0.5tabs x1day, then stop, Disp: 11 tablet, Rfl: 0      Objective:   Vitals:   07/20/18 1440  BP: (!) 144/72  Pulse: 84  SpO2: 97%  Weight: 216 lb (98 kg)  Height: 5\' 4"  (1.626 m)    Estimated body mass index is 37.08 kg/m as  calculated from the following:   Height as of this encounter: 5\' 4"  (1.626 m).   Weight as of this encounter: 216 lb (98 kg).  @WEIGHTCHANGE @  [employer]   07/20/18 1440  Weight: 216 lb (98 kg)     Physical Exam  General Appearance:    Alert, cooperative, no distress, appears stated age - yes , Deconditioned looking - no , OBESE  - yes, Sitting on Wheelchair -  no  Head:    Normocephalic, without obvious abnormality, atraumatic  Eyes:    PERRL, conjunctiva/corneas clear,  Ears:    Normal TM's and external ear canals, both ears  Nose:   Nares normal, septum midline, mucosa normal, no drainage    or sinus tenderness. OXYGEN ON  - YES . Patient is @ 3L PORTABLE  Throat:   Lips, mucosa, and tongue normal; teeth and gums normal. Cyanosis on lips - no  Neck:   Supple, symmetrical, trachea midline, no adenopathy;    thyroid:  no enlargement/tenderness/nodules; no carotid   bruit or JVD  Back:     Symmetric, no curvature, ROM normal, no CVA tenderness  Lungs:     Distress - no ,  Wheeze no, Barrell Chest - yes, Purse lip breathing - no, Crackles - n   Chest Wall:    No tenderness or deformity.    Heart:    Regular rate and rhythm, S1 and S2 normal, no rub   or gallop, Murmur - no  Breast Exam:    NOT DONE  Abdomen:     Soft, non-tender, bowel sounds active all four quadrants,    no masses, no organomegaly. Visceral obesity - YES  Genitalia:   NOT DONE  Rectal:   NOT DONE  Extremities:   Extremities - normal, Has Cane - no, Clubbing - no, Edema - no  Pulses:   2+ and symmetric all extremities  Skin:   Stigmata of Connective Tissue Disease - no  Lymph nodes:   Cervical, supraclavicular, and axillary nodes normal  Psychiatric:  Neurologic:   Pleasant - yes, Anxious - no, Flat affect - no  CAm-ICU - neg, Alert and Oriented x 3 - yes, Moves all 4s - yes, Speech - normal, Cognition - intact           Assessment:       ICD-10-CM   1. COPD with acute exacerbation ([place]) J44.1   2.  COPD, severe ([place]) J44.9 Pulmonary function test  3. Chronic respiratory failure with hypoxia (H[facility]) J96.11   4. Cancer screening Z12.9   5. Encounter for screening for malignant neoplasm of respiratory organs Z12.2   6. Arthralgia, unspecified joint Q22.29 Cyclic citrul peptide antibody, IgG    Rheumatoid Factor    Antinuclear Antib (ANA)       Plan:     Patient Instructions     ICD-10-CM   1. COPD with acute exacerbation ([place]) J44.1   2. COPD, severe ([place]) J44.9 Pulmonary function test  3. Chronic respiratory failure with hypoxia (H[facility]) J96.11   4. Cancer screening Z12.9   5. Encounter for screening for malignant neoplasm of respiratory organs Z12.2   6. Arthralgia, unspecified joint N98.92 Cyclic citrul peptide antibody, IgG    Rheumatoid Factor    Antinuclear Antib (ANA)    Take doxycycline 100mg  po twice daily x 5 days; take after meals and avoid sunlight  Please take prednisone 40 mg x1 day, then 30 mg x1 day, then 20 mg x1 day, then 10 mg x1 day, and then 5 mg x1 day and stop  Continue 2-3L [place] Continue daliresp Continue trelegy  In June 2020  - do spirometry and dlco in June 2020  - do CT low dose screening in June 2020  Follow social distancing for COVID-19 preparedness  Do ANA, CCP and RF for morning stiffness and arthritis/arthralgia  Followup - June 2020 after tests  - return sooner if needed     SIGNATURE    Dr. Brand Males, M.D., F.C.C.P,  Pulmonary and Critical Care Medicine Staff Physician, [place] Director - Interstitial Lung Disease  Program  P[facility] [place] [facility] [place], [place], [address]  Pager: [phone], If no answer or between  15:00h - 7:00h: call [phone] Telephone: [phone]  3:37 PM 07/20/2018

## 2018-07-21 ENCOUNTER — Telehealth: Payer: Self-pay | Admitting: Internal Medicine

## 2018-07-21 MED ORDER — ROFLUMILAST 500 MCG PO TABS: 500.0000 ug | ORAL_TABLET | Freq: Every day | ORAL | 11 refills | Status: DC

## 2018-07-21 MED ORDER — ROFLUMILAST 250 MCG PO TABS: 500.0000 ug | ORAL_TABLET | Freq: Every day | ORAL | 0 refills | Status: DC

## 2018-07-21 NOTE — Telephone Encounter (Signed)
Called and spoke with patient she stated that she is needing a refill of her Daliresp. Refill sent. Nothing further needed.

## 2018-07-21 NOTE — Telephone Encounter (Signed)
Allen Kell, [facility], [phone], left message to call back.

## 2018-07-21 NOTE — Telephone Encounter (Signed)
Called and spoke with Patient.  Patient stated that she was told by pharmacy that her new prescription of Daliresp requires a PA.  Patient has 1 538mcg tab left.  Patient requested samples.  Daliresp samples placed at front desk for pick up. No PA's for Patient were located. Called [facility], spoke with pharmacy.  They are faxing a PA request to [facility] main fax. Will leave message open to follow up on fax.

## 2018-07-22 LAB — ANA: ANA: NEGATIVE

## 2018-07-22 LAB — RHEUMATOID FACTOR

## 2018-07-22 LAB — CYCLIC CITRUL PEPTIDE ANTIBODY, IGG

## 2018-07-24 NOTE — Telephone Encounter (Signed)
ATC [place] no answer. Left message for her to call back.

## 2018-07-25 NOTE — Telephone Encounter (Signed)
Already received response from covermymeds in regards to the PA that was started for pt's daliresp and the response is shown below:  Outcome  Approvedtoday  Request Reference Number: [id number]. DALIRESP TAB 500MCG is approved through 05/17/2019. For further questions, call [phone].  Called pt's pharmacy and spoke with Caryl Pina letting her know that the PA was done and got approval status. Caryl Pina stated that she was able to run the PA and it went through.  Called and spoke with pt letting her know this had been done. Pt expressed understanding. Nothing further needed.

## 2018-07-25 NOTE — Telephone Encounter (Signed)
Allen Kell with [facility]. Unable to reach [facility]

## 2018-07-25 NOTE — Telephone Encounter (Signed)
I did receive a fax for PA that needs to be done on daliresp.  PA has been started via covermymeds:  Medication name and strength: Daliresp 574mcg Provider: [place]: [place] Patient insurance ID: [id number] Phone: [phone] Fax: [phone]  Was the PA started on CMM?  yes If yes, please enter the Key: [id number] Timeframe for approval/denial: within 72 hours should receive a response

## 2018-07-25 NOTE — Telephone Encounter (Signed)
I did not see a fax. Victoria [name] please advise if you have received one. Thank you.

## 2018-07-26 DIAGNOSIS — G4733 Obstructive sleep apnea (adult) (pediatric): Secondary | ICD-10-CM | POA: Diagnosis not present

## 2018-07-27 NOTE — Telephone Encounter (Signed)
Fax was received from [facility] and I faxed it back to [facility] after it was signed by MR.  Attempted to call Primitivo Gauze with [facility] to see if they received the fax back after MR had signed it but unable to reach her. Left a detailed message that I still had the fax if they needed me to refax it back to them. Will hold onto it before I send it to scan in case they still need it. Leaving encounter open.

## 2018-07-31 NOTE — Telephone Encounter (Signed)
[place], unable to reach [facility]

## 2018-08-01 NOTE — Telephone Encounter (Signed)
Primitivo Gauze from [facility] states she received the fax for cpap supplies.  They have everything they need.  Primitivo Gauze phone number is [phone].

## 2018-08-01 NOTE — Telephone Encounter (Signed)
Will close this encounter since nothing further is needed.  

## 2018-08-01 NOTE — Telephone Encounter (Signed)
[place], [phone], left message for Primitivo Gauze to call back.

## 2018-08-03 ENCOUNTER — Telehealth: Payer: Self-pay | Admitting: Internal Medicine

## 2018-08-03 NOTE — Telephone Encounter (Signed)
Called and spoke with pt who stated she was at office 07/20/2018 for an OV with MR and was prescribed abx and pred taper by MR. Pt wanted to know due to the OV with the abx and pred that she was prescribed, should she be tested for the coronavirus.  I stated to pt if she was not running a fever, had not done any recent traveling, had not been around anyone that was sick or that had recently traveled themselves, she did not need to be tested. Pt expressed understanding. Nothing further needed.

## 2018-08-08 ENCOUNTER — Telehealth: Payer: Self-pay | Admitting: Internal Medicine

## 2018-08-08 ENCOUNTER — Ambulatory Visit: Payer: Medicare Other | Admitting: Cardiovascular Disease

## 2018-08-08 NOTE — Telephone Encounter (Signed)
Call made to patient, she states she is supposed to attend a mandatory class tomorrow in which there will be 10 people and it will last 2 hours. Requesting MR recommendations.  MR please advise. Thanks.

## 2018-08-11 NOTE — Telephone Encounter (Signed)
Called and spoke with pt to see if she went to the meeting 3/25 or if she stayed at home and pt stated that the meeting was cancelled so she did not have to leave home. Nothing further needed.

## 2018-08-11 NOTE — Progress Notes (Signed)
We are canceling Victoria [name] appointment on 08/16/2018 as this is a routine visit and she has significant lung disease which would put her at high risk for death if she is infected with the novel coronavirus.  She will continue on anastrozole.  Her appointment is being rescheduled to July.

## 2018-08-12 DIAGNOSIS — J449 Chronic obstructive pulmonary disease, unspecified: Secondary | ICD-10-CM | POA: Diagnosis not present

## 2018-08-16 ENCOUNTER — Inpatient Hospital Stay: Payer: Medicare Other | Attending: Oncology

## 2018-08-16 ENCOUNTER — Inpatient Hospital Stay (HOSPITAL_BASED_OUTPATIENT_CLINIC_OR_DEPARTMENT_OTHER): Payer: Medicare Other | Admitting: Oncology

## 2018-08-16 DIAGNOSIS — Z170 Estrogen receptor positive status [ER+]: Secondary | ICD-10-CM

## 2018-08-16 DIAGNOSIS — M818 Other osteoporosis without current pathological fracture: Secondary | ICD-10-CM

## 2018-08-16 DIAGNOSIS — C50412 Malignant neoplasm of upper-outer quadrant of left female breast: Secondary | ICD-10-CM

## 2018-08-16 MED ORDER — ANASTROZOLE 1 MG PO TABS: 1.0000 mg | ORAL_TABLET | Freq: Every day | ORAL | 4 refills | Status: DC

## 2018-08-21 DIAGNOSIS — G4733 Obstructive sleep apnea (adult) (pediatric): Secondary | ICD-10-CM | POA: Diagnosis not present

## 2018-09-05 ENCOUNTER — Telehealth: Payer: Self-pay | Admitting: Internal Medicine

## 2018-09-05 ENCOUNTER — Other Ambulatory Visit: Payer: Self-pay | Admitting: Internal Medicine

## 2018-09-05 MED ORDER — FLUTICASONE-UMECLIDIN-VILANT 100-62.5-25 MCG/INH IN AEPB: 62.5000 ug | INHALATION_SPRAY | Freq: Every day | RESPIRATORY_TRACT | 3 refills | Status: DC

## 2018-09-05 NOTE — Telephone Encounter (Signed)
Trelegy Rx sent to pt's preferred pharmacy. Called and spoke with pt letting her know this had been done. Pt expressed understanding. Nothing further needed.

## 2018-09-11 ENCOUNTER — Telehealth: Payer: Self-pay | Admitting: Internal Medicine

## 2018-09-11 NOTE — Telephone Encounter (Signed)
Called the patient and she stated her employer is trying to get her to come into work and be in direct contact with patients for evaluation. She works with a [employer]. According to the patient they are not giving clear direction as to what they are doing for staff and patient safety.  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 Her employer is having a conference call today with staff to update. However the patient is concerned that the evaluation requires violation of social distancing and the stay at home order has not been lifted as of today. Patient stated the evaluations can be done over the phone and does not require face to face contact.  Patient requested that Dr. Chase Caller issue a letter she can send to her employer stating that because of her respiratory issues, it is not safe to return to work and would like to be able to continue working from home until the Covid19 crisis is over.  Patient would need the letter addressed to:  [address], [place] [address] Attn: Molli Knock Fax # (patient will call back with this information)  I did let the patient know that Dr. Chase Caller was not in clinic and working at the hospital during Covid crisis. Patient stated she understood and would like him to issue letter.  Message routed to Dr. Chase Caller to advise if this can be done.  Dr. Chase Caller, please see the above and advise if letter can be issued. Thank you much.

## 2018-09-11 NOTE — Telephone Encounter (Signed)
Yes please have an app do that letter  To Whom It May Concern  [place] on account of age 73 and underlying lung disease is a vulnerable person to COVID-19 and other respiratory viruses. Accommodations should be made such as remote work to help risk mitigate  Sincerely your   XXX

## 2018-09-11 NOTE — Telephone Encounter (Signed)
Pt is giving up her Fax# [phone] [facility]  Atten: Irena Cords   Pt number [phone]

## 2018-09-12 ENCOUNTER — Encounter: Payer: Self-pay | Admitting: General Surgery

## 2018-09-12 DIAGNOSIS — J449 Chronic obstructive pulmonary disease, unspecified: Secondary | ICD-10-CM | POA: Diagnosis not present

## 2018-09-12 NOTE — Telephone Encounter (Signed)
Patient is returning phone call. Patient phone number is [phone]. 

## 2018-09-12 NOTE — Telephone Encounter (Signed)
LVM for patient to let her know the letter was approved by Dr. Chase Caller. Stated in vmail letter will be faxed to [place]'s attention and a copy mailed to patient home.  Letter created and signature stamp used.   Fax sent to [phone] in error after two attempts. Realized that was the patient contact number. Need to fax to [phone].  Attempted fax twice. Fax confirmation came back as "no answer".  Faxed to correct # [phone]. Fax confirmation received. Original mailed to patient for her records.   Nothing further needed at this time.

## 2018-09-20 ENCOUNTER — Other Ambulatory Visit: Payer: Medicare Other

## 2018-09-20 ENCOUNTER — Ambulatory Visit: Payer: Medicare Other | Admitting: Oncology

## 2018-09-20 DIAGNOSIS — G4733 Obstructive sleep apnea (adult) (pediatric): Secondary | ICD-10-CM | POA: Diagnosis not present

## 2018-09-22 ENCOUNTER — Other Ambulatory Visit: Payer: Self-pay | Admitting: Surgery

## 2018-09-22 DIAGNOSIS — E042 Nontoxic multinodular goiter: Secondary | ICD-10-CM

## 2018-09-25 ENCOUNTER — Telehealth: Payer: Self-pay | Admitting: Internal Medicine

## 2018-09-25 NOTE — Telephone Encounter (Signed)
Patient is scheduled for her next Prolia injection on 10/06/2018. Patient states that she is covered under [employer] (advise her to contact [employer] to confirm) and asked that we send the script for Prolia to the [facility] on [address].

## 2018-09-26 ENCOUNTER — Other Ambulatory Visit: Payer: Self-pay

## 2018-09-26 MED ORDER — DENOSUMAB 60 MG/ML ~~LOC~~ SOSY: 60.0000 mg | PREFILLED_SYRINGE | Freq: Once | SUBCUTANEOUS | 0 refills | Status: AC

## 2018-09-26 NOTE — Telephone Encounter (Signed)
rx sent to [facility]

## 2018-09-29 NOTE — Telephone Encounter (Signed)
09/28/2018 CRM for notification. See Telephone encounter for: 09/28/18.denosumab (PROLIA) 60 MG/ML SOLN injection. Pt had requested Prolia shot and was told to check with the foundation 1st to make sure monies were available and they verified that she still have $900.00 left from last year,it will be activated by tomorrow/ She is in the process of asking for preauthorization and has been provided the following #'s to give to her dr. Her personal pin ID is [id number].Jarard at the foundation said that [id number] is the pharmacy id #. Please fu with pt as to her next step as she thinks this is all she needs. Her fu # [phone]  ** Pt states that PROLIA will need a Prior Authorization**  09/29/2018 - working on PA form

## 2018-10-04 ENCOUNTER — Ambulatory Visit: Payer: Self-pay | Admitting: Internal Medicine

## 2018-10-04 NOTE — Telephone Encounter (Signed)
I returned Pt's call.   She is  requesting to have the flu shot when she gets her Prolia shot this Friday.  I checked with the office and they don't have flu shots since it is out of season.  She replied, "ok".   "I guess they will call me in the fall to get one".   I let her know she would probably get a reminder call if not to call us back.  She was agreeable to this.

## 2018-10-06 ENCOUNTER — Ambulatory Visit (INDEPENDENT_AMBULATORY_CARE_PROVIDER_SITE_OTHER): Payer: Medicare Other

## 2018-10-06 DIAGNOSIS — M810 Age-related osteoporosis without current pathological fracture: Secondary | ICD-10-CM | POA: Diagnosis not present

## 2018-10-06 MED ORDER — DENOSUMAB 60 MG/ML ~~LOC~~ SOSY
60.0000 mg | PREFILLED_SYRINGE | Freq: Once | SUBCUTANEOUS | Status: AC
  Administered 2018-10-06: 60 mg via SUBCUTANEOUS

## 2018-10-06 NOTE — Progress Notes (Signed)
prolia Injection given.   Iziah Cates J Geraldy Akridge, MD  

## 2018-10-12 DIAGNOSIS — J449 Chronic obstructive pulmonary disease, unspecified: Secondary | ICD-10-CM | POA: Diagnosis not present

## 2018-10-13 ENCOUNTER — Ambulatory Visit
Admission: RE | Admit: 2018-10-13 | Discharge: 2018-10-13 | Disposition: A | Discharge status: Home / Self Care | Payer: Medicare Other | Source: Ambulatory Visit | Attending: Surgery | Admitting: Surgery

## 2018-10-13 DIAGNOSIS — E041 Nontoxic single thyroid nodule: Secondary | ICD-10-CM | POA: Diagnosis not present

## 2018-10-13 DIAGNOSIS — E042 Nontoxic multinodular goiter: Secondary | ICD-10-CM

## 2018-10-21 ENCOUNTER — Other Ambulatory Visit: Payer: Self-pay | Admitting: Internal Medicine

## 2018-10-21 DIAGNOSIS — I10 Essential (primary) hypertension: Secondary | ICD-10-CM

## 2018-10-25 DIAGNOSIS — G4733 Obstructive sleep apnea (adult) (pediatric): Secondary | ICD-10-CM | POA: Diagnosis not present

## 2018-10-27 DIAGNOSIS — G4733 Obstructive sleep apnea (adult) (pediatric): Secondary | ICD-10-CM | POA: Diagnosis not present

## 2018-11-02 ENCOUNTER — Other Ambulatory Visit: Payer: Self-pay

## 2018-11-02 ENCOUNTER — Telehealth (INDEPENDENT_AMBULATORY_CARE_PROVIDER_SITE_OTHER): Payer: Medicare Other | Admitting: Cardiovascular Disease

## 2018-11-02 VITALS — BP 187/68 | HR 85 | Ht 64.5 in | Wt 220.0 lb

## 2018-11-02 DIAGNOSIS — Z853 Personal history of malignant neoplasm of breast: Secondary | ICD-10-CM

## 2018-11-02 DIAGNOSIS — E785 Hyperlipidemia, unspecified: Secondary | ICD-10-CM | POA: Diagnosis not present

## 2018-11-02 DIAGNOSIS — I700 Atherosclerosis of aorta: Secondary | ICD-10-CM

## 2018-11-02 DIAGNOSIS — I2510 Atherosclerotic heart disease of native coronary artery without angina pectoris: Secondary | ICD-10-CM | POA: Diagnosis not present

## 2018-11-02 DIAGNOSIS — G4733 Obstructive sleep apnea (adult) (pediatric): Secondary | ICD-10-CM

## 2018-11-02 DIAGNOSIS — J449 Chronic obstructive pulmonary disease, unspecified: Secondary | ICD-10-CM | POA: Diagnosis not present

## 2018-11-02 DIAGNOSIS — I10 Essential (primary) hypertension: Secondary | ICD-10-CM

## 2018-11-02 MED ORDER — ATORVASTATIN CALCIUM 80 MG PO TABS: 80.0000 mg | ORAL_TABLET | Freq: Every day | ORAL | 1 refills | Status: DC

## 2018-11-02 MED ORDER — OLMESARTAN MEDOXOMIL 20 MG PO TABS: 20.0000 mg | ORAL_TABLET | Freq: Every day | ORAL | 1 refills | Status: DC

## 2018-11-02 NOTE — Patient Instructions (Signed)
Medication Instructions:  Start olmesartan 20 mg daily  If you need a refill on your cardiac medications before your next appointment, please call your pharmacy.   Lab work: CMET, LIPID in 2 weeks  Attached are the lab orders that are needed before your upcoming appointment, please come in  anytime to have your labs drawn.   They are fasting labs, so nothing to eat or drink after midnight.  Lab hours: 8:00-4:00 lunch hours 12:45-1:45  If you have labs (blood work) drawn today and your tests are completely normal, you will receive your results only by: [name] MyChart Message (if you have MyChart) OR . A paper copy in the mail If you have any lab test that is abnormal or we need to change your treatment, we will call you to review the results.  Testing/Procedures: You will be called to set up for CT and ECHO Thank you!    Follow-Up: At [facility], you and your health needs are our priority.  As part of our continuing mission to provide you with exceptional heart care, we have created designated Provider Care Teams.  These Care Teams include your primary Cardiologist (physician) and Advanced Practice Providers (APPs -  Physician Assistants and Nurse Practitioners) who all work together to provide you with the care you need, when you need it. You will need a follow up appointment in 2 months. You may see Dr.Kelly or one of the following Advanced Practice Providers on your designated Care Team: Almyra Deforest, [place] . Fabian Sharp, PA-C

## 2018-11-02 NOTE — Progress Notes (Signed)
Virtual Visit via Telephone Note   This visit type was conducted due to national recommendations for restrictions regarding the COVID-19 Pandemic (e.g. social distancing) in an effort to limit this patient's exposure and mitigate transmission in our community.  Due to her co-morbid illnesses, this patient is at least at moderate risk for complications without adequate follow up.  This format is felt to be most appropriate for this patient at this time.  The patient did not have access to video technology/had technical difficulties with video requiring transitioning to audio format only (telephone).  All issues noted in this document were discussed and addressed.  No physical exam could be performed with this format.  Please refer to the patient's chart for her  consent to telehealth for [facility].   Date:  11/02/2018   ID:  Victoria [name], DOB 1945/11/17, MRN [id number]  Patient Location: Home Provider Location: Home  PCP:  Janith Lima, MD  Cardiologist:  Shelva Majestic, MD Electrophysiologist:  None   Evaluation Performed:  Follow-Up Visit  Chief Complaint: 60-month follow-up evaluation  History of Present Illness:    Victoria [name] is a 73 y.o. female who was referred by Dr. Jana Hakim after a chest CT suggested multivessel CAD.  Ms. Wease is a long-standing history of smoking for 50 years but fortunately quit smoking in 2012.  He has a history of hypertension and I may have seen her over 20 years ago on one occasion.  In 2010, she was seen by Dr. Tina Griffiths return ECG was interpreted as abnormal and suggested evidence for old septal infarct and incomplete right bundle branch block.  An echo Doppler study showed mild to moderate LVH, mild to moderate pulmonary hypertension and aortic and mitral sclerosis without stenosis.  A myocardial perfusion study did not show evidence of ischemia.  She has a history of severe COPD, obesity, and sleep apnea on CPAP therapy followed by Dr.  Chase Caller.  He has a history of breast CA, and is status post lumpectomy followed by Dr. Jana Hakim.  She recently underwent a chest CT for lung screening surveillance which did not show evidence for lung cancer.  However, there was a suggestion of aortic and coronary atherosclerosis including calcified plaque in the left main, LAD, circumflex and RCA.  She denied any anginal type symptoms.  Her shortness of breath is felt to be contributed to her COPD.   During that evaluation I recommended that she undergo an echo Doppler study to evaluate both systolic and diastolic function as well as PA pressure.  I scheduled her for CT coronary angios to evaluate the extent of her coronary atherosclerosis and if moderate stenosis identified FFR.  I also recommended follow-up laboratory.  Her blood pressure was stable on her regimen of amlodipine 10 mg and hydrochlorothiazide.  She was on DuoNeb, [place] had been started on Trelegy inhaler for COPD.  Since I saw her for initial evaluation, she has been undergoing evaluation with Dr. Chase Caller.  Once COVID-19 endemic occurred it was advised that she stay at home rather than working on site.  Apparently she never had the echo or CT angios studies done.  When I saw her initially in light of her LDL cholesterol and evidence for atherosclerosis I recommended further titration of atorvastatin to 80 mg.  She has not had any follow-up lab work.  She tells me she underwent an ultrasound of her thyroid in May with Dr. Lynford Humphrey.  She has a history of multinodular goiter with at  least 11 nodules which apparently are relatively stable.  She presents for evaluation.  The patient does not have symptoms concerning for COVID-19 infection (fever, chills, cough, or new shortness of breath).    Past Medical History:  Diagnosis Date  . Abdominal aneurysm ([place])    Dr. Oneida Alar follows lLOV 2 ''17 per pt "around 2 cm"  . Anemia    as a child  . Arthritis    left ankle, right knee, right SI  joint, wrists, lower back  . Breast cancer in female [facility])    Left  . COPD (chronic obstructive pulmonary disease) ([place])    ephysema-Dr. Chase Caller  . Dyspnea   . Headache    as a child would have terrible headaches during season changes  . 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 Hyperlipidemia   . Hypertension   . Multiple thyroid nodules   . Murmur, cardiac 1950  . Osteoporosis   . Pneumonia   . Pre-diabetes   . Requires continuous at home supplemental oxygen    2 L 24/7  . Sebaceous cyst    hairline sebaceous cyst left posterior neck to be excised 04-13-16 by Dr. Harlow Asa in [place] hospital  . Sleep apnea    cpap used sometimes, uses oxygen concentrator 2 l/m nasally bedtime  . Varicella as child   Past Surgical History:  Procedure Laterality Date  . APPENDECTOMY     2008  . BREAST LUMPECTOMY WITH RADIOACTIVE SEED LOCALIZATION Left 08/10/2017   Procedure: BREAST LUMPECTOMY WITH RADIOACTIVE SEED LOCALIZATION;  Surgeon: Rolm Bookbinder, MD;  Location: [place];  Service: General;  Laterality: Left;  . [place]  . COLONOSCOPY    . COLONOSCOPY WITH PROPOFOL N/A 04/16/2016   Procedure: COLONOSCOPY WITH PROPOFOL;  Surgeon: Carol Ada, MD;  Location: [facility];  Service: Endoscopy;  Laterality: N/A;  . CYST REMOVAL NECK Left 04/13/2016   Procedure: EXCISION OF SEBACEOUS CYST LEFT POSTERIOR NECK;  Surgeon: Armandina Gemma, MD;  Location: [place];  Service: General;  Laterality: Left;  . Excision of Pelvic Absess, Right Ovary     2008  . RE-EXCISION OF BREAST CANCER,SUPERIOR MARGINS Left 08/31/2017   Procedure: RE-EXCISION OF LEFT  BREAST MARGINS ERAS PATHWAY;  Surgeon: Rolm Bookbinder, MD;  Location: [place];  Service: General;  Laterality: Left;  . TUBAL LIGATION  1980     Current Meds  Medication Sig  . acetaminophen (TYLENOL) 500 MG tablet Take 1,000 mg by mouth every 6 (six) hours as needed.  . Albuterol Sulfate (PROAIR RESPICLICK) 952 (90 Base) MCG/ACT  AEPB Inhale 1-2 puffs into the lungs every 6 (six) hours as needed (for wheezing/shortness of breath).  [name] amLODipine (NORVASC) 10 MG tablet Take 1 tablet by mouth once daily  . anastrozole (ARIMIDEX) 1 MG tablet Take 1 tablet (1 mg total) by mouth daily.  [name] atorvastatin (LIPITOR) 80 MG tablet Take 1 tablet (80 mg total) by mouth daily at 6 PM.  . Cholecalciferol (VITAMIN D3) 2000 units TABS Take 4,000 Units by mouth daily.  [name] denosumab (PROLIA) 60 MG/ML SOLN injection Inject 60 mg into the skin every 6 (six) months. Administer in upper arm, thigh, or abdomen  . doxycycline (VIBRA-TABS) 100 MG tablet Take 1 tablet (100 mg total) by mouth 2 (two) times daily.  . Fluticasone-Umeclidin-Vilant (TRELEGY ELLIPTA) 100-62.5-25 MCG/INH AEPB Inhale 62.5 mcg into the lungs daily.  . furosemide (LASIX) 20 MG tablet furosemide 20 mg tablet  TAKE 1 TABLET BY MOUTH ONCE DAILY  . hydrochlorothiazide (HYDRODIURIL) 25 MG tablet Take 1 tablet (25 mg total) by mouth daily.  [name] ipratropium-albuterol (DUONEB) 0.5-2.5 (3) MG/3ML SOLN USE ONE VIAL IN NEBULIZER 4 TIMES DAILY (Patient taking differently: USE ONE VIAL IN NEBULIZER 4 TIMES DAILY)  . OXYGEN Inhale 2-3 L into the lungs continuous. When exerting self   . potassium chloride SA (K-DUR,KLOR-CON) 20 MEQ tablet Take 1 tablet (20 mEq total) by mouth 2 (two) times daily.  . predniSONE (DELTASONE) 10 MG tablet Take 4tabs x1day, 3tabs x1day,2tabs x1day, 1tab x1day, 0.5tabs x1day, then stop  . Roflumilast (DALIRESP) 250 MCG TABS Take 500 mcg by mouth daily.  . roflumilast (DALIRESP) 500 MCG TABS tablet Take 1 tablet (500 mcg total) by mouth daily.  . sodium chloride (OCEAN) 0.65 % SOLN nasal spray Place 1 spray into both nostrils as needed (dryness).     Allergies:   Benicar [olmesartan], [name] [valsartan], Hydrocodone-acetaminophen, Lisinopril, Monosodium glutamate, Codeine, Lead acetate, and Nickel   Social History   Tobacco Use  . Smoking status: Former Smoker     Packs/day: 1.00    Years: 50.00    Pack years: 50.00    Types: Cigarettes    Quit date: 04/16/2011    Years since quitting: 7.5  . Smokeless tobacco: Never Used  . Tobacco comment: quit that date when she had to go to ER   Substance Use Topics  . Alcohol use: Yes    Alcohol/week: 0.0 standard drinks    Comment: rare occasion  . Drug use: No    Comment: no marijuana since 2012     Family Hx: The patient's family history includes Alzheimer's disease in her brother and father; Breast cancer in her paternal grandmother; Diabetes in her brother; Heart disease in her mother; Hyperlipidemia in her brother and brother; Hypertension in her brother, brother, and mother; Stroke (age of onset: 36) in her father.  ROS:   Please see the history of present illness.    No fevers chills or night sweats Positive for recent COPD with acute exacerbation for which she has seen Dr. Chase Caller in March 2020.She is on 2 L of oxygen at home and 3 L with a portable system Negative for chest tightness No cold or heat intolerance Positive for sleep apnea on CPAP therapy All other systems reviewed and are negative.   Prior CV studies:   The following studies were reviewed today:  Cardiac studies not yet done.  I reviewed the records of Dr. Jana Hakim and Dr. Chase Caller  Labs/Other Tests and Data Reviewed:    EKG:  An ECG dated 02/13/2018 was personally reviewed today and demonstrated:  Normal sinus rhythm at 84 bpm.  QS V1 V2.  No ST segment changes.  Normal intervals.  Recent Labs: 03/13/2018: ALT 9; BUN 16; Creatinine, Ser 0.82; Hemoglobin 13.1; Platelets 271; Potassium 3.7; Sodium 143; TSH 0.402   Recent Lipid Panel Lab Results  Component Value Date/Time   CHOL 176 03/13/2018 03:39 PM   TRIG 120 03/13/2018 03:39 PM   HDL 69 03/13/2018 03:39 PM   CHOLHDL 2.6 03/13/2018 03:39 PM   CHOLHDL 3 01/13/2017 04:46 PM   LDLCALC 83 03/13/2018 03:39 PM   LDLDIRECT 121.0 09/05/2014 04:49 PM    Wt Readings  from Last 3 Encounters:  11/02/18 220 lb (99.8 kg)  07/20/18 216 lb (98 kg)  06/12/18 215 lb (97.5 kg)     Objective:    Vital Signs:  BP (!) 187/68  Pulse 85   Ht 5' 4.5" (1.638 m)   Wt 220 lb (99.8 kg)   BMI 37.18 kg/m    I had a recheck her blood pressure during the telemedicine visit and this remained elevated but was slightly improved at 175/82  Since this was a phone visit I could not visually inspect the patient. She was breathing comfortably, respirations were unlabored There was no audible wheezing She denied any significant change in her physical appearance She denies any discomfort to chest wall or abdomen Her pulse rate is regular She denied edema or swelling in her legs She denied neurologic symptoms She had normal affect and mood   ASSESSMENT & PLAN:    1. Atherosclerosis involving her coronary arteries and aorta: This was initially detected on a chest CT.  I had recommended she undergo CT coronary angios to evaluate the extent of her coronary atherosclerosis with possible FFR if indicated.  This will be reordered.  She is asymptomatic with reference to chest tightness or pressure 2. COPD with recent exacerbation: Followed by Dr. Chase Caller on a multiple medical regimen as listed in the medications.  On supplemental oxygen. 3. Essential hypertension: Blood pressure today is significantly elevated with systolic pressures ranging from 1 75-1 87.  She continues to be on amlodipine 10 mg and furosemide 20 mg.  She has normal renal function.  I will initiate ARB therapy with olmesartan 20 mg daily. 4. Hyperlipidemia with target LDL less than 70: When I saw her initially I further titrated atorvastatin to 80 mg.  She will undergo repeat laboratory including a chemistry evaluation as well as fasting lipid studies. 5. Multinodular goiter: Status post remote biopsy and recent thyroid ultrasound, reviewed with patient today.  She is followed by Dr. Harlow Asa. 6. History of breast  CA: Followed by Dr. Jana Hakim on Prolia 7. OSA on CPAP therapy: Followed by Dr. Chase Caller  COVID-19 Education: The signs and symptoms of COVID-19 were discussed with the patient and how to seek care for testing (follow up with PCP or arrange E-visit).  The importance of social distancing was discussed today.  Time:   Today, I have spent 25 minutes with the patient with telehealth technology discussing the above problems.     Medication Adjustments/Labs and Tests Ordered: Current medicines are reviewed at length with the patient today.  Concerns regarding medicines are outlined above.   Tests Ordered: No orders of the defined types were placed in this encounter.   Medication Changes: No orders of the defined types were placed in this encounter.   Follow Up: Cmet and fasting lipid studies will be obtained in several weeks; follow-up office visit in 2 months  Signed, Shelva Majestic, MD  11/02/2018 2:41 PM    [place]

## 2018-11-03 ENCOUNTER — Telehealth: Payer: Self-pay | Admitting: Cardiovascular Disease

## 2018-11-03 ENCOUNTER — Other Ambulatory Visit: Payer: Self-pay | Admitting: *Deleted

## 2018-11-03 DIAGNOSIS — I10 Essential (primary) hypertension: Secondary | ICD-10-CM

## 2018-11-03 MED ORDER — ATORVASTATIN CALCIUM 80 MG PO TABS: 80.0000 mg | ORAL_TABLET | Freq: Every day | ORAL | 3 refills | Status: DC

## 2018-11-03 MED ORDER — CHLORTHALIDONE 25 MG PO TABS: 25.0000 mg | ORAL_TABLET | Freq: Every day | ORAL | 3 refills | Status: DC

## 2018-11-03 NOTE — Telephone Encounter (Signed)
Returned call to patient. She reports she is allergic to Benicar - this is on allergy list along with valsartan.   Will route to Dr. Claiborne Billings and Almyra Free LPN

## 2018-11-03 NOTE — Addendum Note (Signed)
Addended by: Caprice Beaver T on: 11/03/2018 11:19 AM   Modules accepted: Orders

## 2018-11-03 NOTE — Telephone Encounter (Signed)
Follow up    Please call patient she would like to discuss medication as well

## 2018-11-03 NOTE — Telephone Encounter (Signed)
New message    Pt c/o medication issue:  1. Name of Medication: benicar  2. How are you currently taking this medication (dosage and times per day)? Not taking   3. Are you having a reaction (difficulty breathing--STAT)?  Allergic to it  4. What is your medication issue? Pharmacy called her yesterday to say Dr Claiborne Billings called her in [place] , she can not take it

## 2018-11-03 NOTE — Telephone Encounter (Signed)
This patient is allergic to ACEi and ARBS including lisinopril, olmesartan, and valsartan.  Also noted she is currently on maximum amlodipine dose.   Recommendation:  Start chlorthalidone 25mg  daily and repeat BMET in 2-3 weeks (prefer)   OR  Start carvedilol 6.25mg  twice daily (if patient refuse diuretic)

## 2018-11-03 NOTE — Telephone Encounter (Signed)
Any other options for patient?  Dr.Kelly wanted her on Benicar but she is allergic.

## 2018-11-03 NOTE — Telephone Encounter (Signed)
Called patient, she is going to do the Chlorathalidone medication- and do blood work in 2-3 weeks.  Patient verbalized understanding, BMET ordered.  Med sent to pharmacy- removed Benicar.

## 2018-11-09 ENCOUNTER — Telehealth (HOSPITAL_COMMUNITY): Payer: Self-pay | Admitting: Cardiology

## 2018-11-09 NOTE — Telephone Encounter (Signed)

## 2018-11-10 ENCOUNTER — Other Ambulatory Visit: Payer: Self-pay

## 2018-11-10 ENCOUNTER — Ambulatory Visit (HOSPITAL_COMMUNITY): Payer: Medicare Other | Attending: Internal Medicine

## 2018-11-10 DIAGNOSIS — I10 Essential (primary) hypertension: Secondary | ICD-10-CM | POA: Insufficient documentation

## 2018-11-12 DIAGNOSIS — J449 Chronic obstructive pulmonary disease, unspecified: Secondary | ICD-10-CM | POA: Diagnosis not present

## 2018-11-23 ENCOUNTER — Encounter (HOSPITAL_COMMUNITY): Payer: Self-pay

## 2018-11-23 ENCOUNTER — Ambulatory Visit (HOSPITAL_COMMUNITY): Payer: Medicare Other

## 2018-11-23 ENCOUNTER — Other Ambulatory Visit: Payer: Self-pay

## 2018-11-23 ENCOUNTER — Ambulatory Visit (HOSPITAL_COMMUNITY)
Admission: RE | Admit: 2018-11-23 | Discharge: 2018-11-23 | Disposition: A | Discharge status: Home / Self Care | Payer: Medicare Other | Source: Ambulatory Visit | Attending: Cardiovascular Disease | Admitting: Cardiovascular Disease

## 2018-11-23 DIAGNOSIS — I2510 Atherosclerotic heart disease of native coronary artery without angina pectoris: Secondary | ICD-10-CM | POA: Diagnosis not present

## 2018-11-23 DIAGNOSIS — I700 Atherosclerosis of aorta: Secondary | ICD-10-CM

## 2018-11-23 LAB — POCT I-STAT CREATININE: Creatinine, Ser: 0.6 mg/dL (ref 0.44–1.00)

## 2018-11-23 MED ORDER — IOHEXOL 350 MG/ML SOLN
100.0000 mL | Freq: Once | INTRAVENOUS | Status: AC | PRN
  Administered 2018-11-23: 100 mL via INTRAVENOUS

## 2018-11-23 MED ORDER — METOPROLOL TARTRATE 5 MG/5ML IV SOLN
INTRAVENOUS | Status: AC
  Administered 2018-11-23: 10:00:00 5 mg via INTRAVENOUS
  Filled 2018-11-23: qty 15

## 2018-11-23 MED ORDER — NITROGLYCERIN 0.4 MG SL SUBL
0.8000 mg | SUBLINGUAL_TABLET | Freq: Once | SUBLINGUAL | Status: AC
  Administered 2018-11-23: 11:00:00 0.8 mg via SUBLINGUAL

## 2018-11-23 MED ORDER — NITROGLYCERIN 0.4 MG SL SUBL
SUBLINGUAL_TABLET | SUBLINGUAL | Status: AC
  Administered 2018-11-23: 11:00:00 0.8 mg via SUBLINGUAL
  Filled 2018-11-23: qty 2

## 2018-11-23 MED ORDER — METOPROLOL TARTRATE 5 MG/5ML IV SOLN
5.0000 mg | INTRAVENOUS | Status: DC | PRN
  Administered 2018-11-23 (×3): 5 mg via INTRAVENOUS

## 2018-11-24 ENCOUNTER — Other Ambulatory Visit: Payer: Self-pay | Admitting: *Deleted

## 2018-11-24 DIAGNOSIS — Z122 Encounter for screening for malignant neoplasm of respiratory organs: Secondary | ICD-10-CM

## 2018-11-24 DIAGNOSIS — Z87891 Personal history of nicotine dependence: Secondary | ICD-10-CM

## 2018-11-27 ENCOUNTER — Ambulatory Visit (HOSPITAL_COMMUNITY)
Admission: RE | Admit: 2018-11-27 | Discharge: 2018-11-27 | Disposition: A | Discharge status: Home / Self Care | Payer: Medicare Other | Source: Ambulatory Visit | Attending: Cardiovascular Disease | Admitting: Cardiovascular Disease

## 2018-11-27 DIAGNOSIS — I2510 Atherosclerotic heart disease of native coronary artery without angina pectoris: Secondary | ICD-10-CM | POA: Insufficient documentation

## 2018-12-04 ENCOUNTER — Ambulatory Visit
Admission: RE | Admit: 2018-12-04 | Discharge: 2018-12-04 | Disposition: A | Discharge status: Home / Self Care | Payer: Medicare Other | Source: Ambulatory Visit | Attending: Obstetrics and Gynecology | Admitting: Obstetrics and Gynecology

## 2018-12-04 ENCOUNTER — Other Ambulatory Visit: Payer: Self-pay

## 2018-12-04 DIAGNOSIS — Z853 Personal history of malignant neoplasm of breast: Secondary | ICD-10-CM | POA: Diagnosis not present

## 2018-12-04 DIAGNOSIS — R928 Other abnormal and inconclusive findings on diagnostic imaging of breast: Secondary | ICD-10-CM | POA: Diagnosis not present

## 2018-12-08 ENCOUNTER — Other Ambulatory Visit: Payer: Self-pay | Admitting: *Deleted

## 2018-12-08 DIAGNOSIS — Z170 Estrogen receptor positive status [ER+]: Secondary | ICD-10-CM

## 2018-12-08 DIAGNOSIS — C50412 Malignant neoplasm of upper-outer quadrant of left female breast: Secondary | ICD-10-CM

## 2018-12-10 NOTE — Progress Notes (Signed)
[facility]  [phone] [phone]     ID: Victoria [name] DOB: [birth year]  MR#: [id number]  JYN#:[id number]  Patient Care Team: Victoria Lima, MD as PCP - General (Internal Medicine) Victoria Males, MD as Consulting Physician (Pulmonary Disease) Victoria Nigh, MD as Consulting Physician (Obstetrics and Gynecology) Victoria [name], Victoria Dad, MD as Consulting Physician (Oncology) Victoria Bookbinder, MD as Consulting Physician (General Surgery) Victoria Pray, MD as Consulting Physician (Radiation Oncology) Victoria Sine, MD as Consulting Physician (Cardiology) Victoria [name], Victoria Massed, NP as Nurse Practitioner (Hematology and Oncology) Victoria Kingdom, MD as Consulting Physician (Internal Medicine) Parrett, Victoria Mu, NP as Nurse Practitioner (Pulmonary Disease) OTHER MD:  CHIEF COMPLAINT: Estrogen receptor positive breast cancer   CURRENT TREATMENT: Anastrozole   INTERVAL HISTORY: Victoria [name] returns today for follow up of her estrogen receptor positive breast cancer.  She continues on anastrozole with good tolerance. She denies hot flashes or vaginal dryness. She does reports occasional night sweats. She pays about $10 for a 90 day prescription.   Her most recent bone density on 12/08/2016 showed a T-score of -2.4  Bilateral diagnostic mammography with tomography 12/04/2018 shows breast density category B.  There was no evidence of malignancy.  Because of severe atherosclerotic calcification she was evaluated by Dr. Claiborne [name] 11/02/2018.  He had recommended CT coronary angiography and echocardiogram and close management of her cholesterol medication.  All this was ordered but for reasons not clear to Dr. Claiborne [name] or myself was not done.  It was then reordered and on 11/27/2018 showed atherosclerosis involving Victoria coronary arteries and aorta but no hemodynamically significant stenosis in Victoria LAD or RCA.  She also underwent thyroid ultrasound on 10/13/2018,  which was stable.  She is scheduled to undergo lung cancer screening chest CT on 01/10/2019.   REVIEW OF SYSTEMS: Victoria [name] reports that she continues to work. She states she was told to return to Victoria office, but she refused due to her susceptibility to Victoria virus. She had Dr. Chase [name] write a note for her. She does some walking for exercise. She reports Dr. Chase [name] recommended she go on a low-glycemic diet, and she has lost 2 lbs since starting about a week ago. A detailed review of systems was otherwise stable.     HISTORY OF CURRENT ILLNESS: From Victoria original intake note:  Victoria [name] had routine screening mammography on 06/16/2017 showing a possible abnormality in Victoria left breast. She underwent unilateral left diagnostic mammography with tomography and left breast ultrasonography at [name] on 06/22/2017 showing: breast density category B. Suspicious left breast mass in Victoria 2:30 position upper outer quadrant measuring 0.6 x 0.7 x 0.6 cm located 2 cm from Victoria nipple. Palpable thickening with distortion. There was no sonographic evidence of lest axillary lymphadenopathy.  Accordingly on 06/24/2017 she proceeded to biopsy of Victoria left breast mass in question. Victoria pathology from this procedure showed ([id number]):  Invasive mammary carcinoma, grade 1. E-cadherin is positive supporting Ductal carcinoma diagnosis. Prognostic indicators significant for: estrogen receptor, 100% positive and progesterone receptor, 100% positive, both with strong staining intensity. Proliferation marker Ki67 at 5%. HER2 not amplified with ratios of HER2/CEP17 signals 1.21 and average copies per cell 2.00.  Victoria patient's subsequent history is as detailed below.   PAST MEDICAL HISTORY: Past Medical History:  Diagnosis Date   Abdominal aneurysm (Victoria [name])    Dr. Oneida [name] follows lLOV 2 ''17 per pt "around 2 cm"   Anemia    as a child   Arthritis  left ankle, right knee, right SI joint, wrists, lower  back   Breast cancer in female Victoria [name])    Left   COPD (chronic obstructive pulmonary disease) (Victoria [name])    ephysema-Dr. Chase [name]   Dyspnea    Headache    as a child would have terrible headaches during season changes   Heart murmur    congenital, 2 D echo '10   Hyperlipidemia    Hypertension    Multiple thyroid nodules    Murmur, cardiac 1950   Osteoporosis    Pneumonia    Pre-diabetes    Requires continuous at home supplemental oxygen    2 L 24/7   Sebaceous cyst    hairline sebaceous cyst left posterior neck to be excised 04-13-16 by Dr. Harlow [name] in [name]   Sleep apnea    cpap used sometimes, uses oxygen concentrator 2 l/m nasally bedtime   Varicella as child    PAST SURGICAL HISTORY: Past Surgical History:  Procedure Laterality Date   APPENDECTOMY     2008   BREAST LUMPECTOMY Left 08/31/2017   BREAST LUMPECTOMY WITH RADIOACTIVE SEED LOCALIZATION Left 08/10/2017   Procedure: BREAST LUMPECTOMY WITH RADIOACTIVE SEED LOCALIZATION;  Surgeon: Victoria Bookbinder, MD;  Location: [name];  Service: General;  Laterality: Left;   [name]   COLONOSCOPY     COLONOSCOPY WITH PROPOFOL N/A 04/16/2016   Procedure: COLONOSCOPY WITH PROPOFOL;  Surgeon: Carol Ada, MD;  Location: [facility];  Service: Endoscopy;  Laterality: N/A;   CYST REMOVAL NECK Left 04/13/2016   Procedure: EXCISION OF SEBACEOUS CYST LEFT POSTERIOR NECK;  Surgeon: Armandina Gemma, MD;  Location: [facility];  Service: General;  Laterality: Left;   Excision of Pelvic Absess, Right Ovary     2008   RE-EXCISION OF BREAST CANCER,SUPERIOR MARGINS Left 08/31/2017   Procedure: RE-EXCISION OF LEFT  BREAST MARGINS ERAS PATHWAY;  Surgeon: Victoria Bookbinder, MD;  Location: [facility];  Service: General;  Laterality: Left;   TUBAL LIGATION  1980    FAMILY HISTORY Family History  Problem Relation Age of Onset   Heart disease Mother        MI - fatal   Hypertension Mother    Stroke  Father 27       fatal   Alzheimer's disease Father    Alzheimer's disease Brother    Hyperlipidemia Brother    Hypertension Brother    Diabetes Brother    Hypertension Brother    Hyperlipidemia Brother    Breast cancer Paternal Grandmother   Victoria patient's father died at age 64 due to a stroke and prostate cancer.  Victoria patient's mother died at age 34 due to a heart attack and blood clot. Victoria patient has 3 brothers and no sisters. She notes a paternal grandmother who was diagnosed with breast cancer at age 49. She notes a paternal cousin with lung cancer. She denies a family history of ovarian cancer.   GYNECOLOGIC HISTORY:  No LMP recorded. Patient is postmenopausal. Menarche: 73 years old Age at first live birth: 73 years old [place] P1 LMP: age 22 Victoria patient had a right ovarian cyst which required removal. She notes that she still has her left ovary. She was on HRT for a short amount of time, but discontinued due to negative side affects. She was placed on Estroven as well.    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 She is divorced. Victoria patient's son, Victoria [name], is disabled and works part-time at [employer] in [place]. Victoria patient has 3 grandchildren and 3 great-grandchildren. She attends [employer].      ADVANCED DIRECTIVES:  Son, Victoria [name] and daughter in law, Victoria [name]   HEALTH MAINTENANCE: Social History   Tobacco Use   Smoking status: Former Smoker    Packs/day: 1.00    Years: 50.00    Pack years: 50.00    Types: Cigarettes    Quit date: 04/16/2011    Years since quitting: 7.6   Smokeless tobacco: Never Used   Tobacco comment: quit that date when she had to go to ER   Substance Use Topics   Alcohol use: Yes    Alcohol/week: 0.0 standard drinks     Comment: rare occasion   Drug use: No    Comment: no marijuana since 2012     Colonoscopy: December 2017/ Dr. Benson Norway  PAP: September 2016  Bone density: 12/08/2016 showed a T score of -2.4   Allergies  Allergen Reactions   Benicar [Olmesartan] Swelling    Swelling of face and arms    [name] [Valsartan] Swelling    Swelling of face and arms    Hydrocodone-Acetaminophen Nausea And Vomiting    Severe vomiting   Lisinopril Cough   Monosodium Glutamate Other (See Comments)    Facial swelling per pt   Codeine Other (See Comments)    jittery   Lead Acetate Rash   Nickel Rash    Severe rash to infection: pt is allergic to all metals other than sterling silver or gold jewelry.     Current Outpatient Medications  Medication Sig Dispense Refill   acetaminophen (TYLENOL) 500 MG tablet Take 1,000 mg by mouth every 6 (six) hours as needed.     Albuterol Sulfate (PROAIR RESPICLICK) 982 (90 Base) MCG/ACT AEPB Inhale 1-2 puffs into Victoria lungs every 6 (six) hours as needed (for wheezing/shortness of breath). 1 each 0   amLODipine (NORVASC) 10 MG tablet Take 1 tablet by mouth once daily 90 tablet 0   anastrozole (ARIMIDEX) 1 MG tablet Take 1 tablet (1 mg total) by mouth daily. 90 tablet 4   atorvastatin (LIPITOR) 80 MG tablet Take 1 tablet (80 mg total) by mouth daily at 6 PM. 90 tablet 3   chlorthalidone (HYGROTON) 25 MG tablet Take 1 tablet (25 mg total) by mouth daily. 90 tablet 3   Cholecalciferol (VITAMIN D3) 2000 units TABS Take 4,000 Units by mouth daily.     denosumab (PROLIA) 60 MG/ML SOLN injection Inject 60 mg into Victoria skin every 6 (six) months. Administer in upper arm, thigh, or abdomen     doxycycline (VIBRA-TABS) 100 MG tablet Take 1 tablet (100 mg total) by mouth 2 (two) times daily. 10 tablet 0   Fluticasone-Umeclidin-Vilant (TRELEGY ELLIPTA) 100-62.5-25 MCG/INH AEPB Inhale 62.5 mcg into Victoria lungs daily. 90 each 3   furosemide (LASIX) 20 MG tablet furosemide 20 mg  tablet  TAKE 1 TABLET BY MOUTH ONCE DAILY     hydrochlorothiazide (HYDRODIURIL) 25 MG tablet Take 1 tablet (25 mg total) by mouth daily. 90 tablet 3   ipratropium-albuterol (DUONEB) 0.5-2.5 (3) MG/3ML SOLN USE ONE VIAL IN NEBULIZER 4 TIMES DAILY (Patient taking differently: USE ONE VIAL IN NEBULIZER 4 TIMES DAILY) 360 mL 5   OXYGEN Inhale 2-3 L into Victoria lungs continuous. When exerting self  potassium chloride SA (K-DUR,KLOR-CON) 20 MEQ tablet Take 1 tablet (20 mEq total) by mouth 2 (two) times daily. 180 tablet 1   predniSONE (DELTASONE) 10 MG tablet Take 4tabs x1day, 3tabs x1day,2tabs x1day, 1tab x1day, 0.5tabs x1day, then stop 11 tablet 0   Roflumilast (DALIRESP) 250 MCG TABS Take 500 mcg by mouth daily. 28 tablet 0   roflumilast (DALIRESP) 500 MCG TABS tablet Take 1 tablet (500 mcg total) by mouth daily. 30 tablet 11   sodium chloride (OCEAN) 0.65 % SOLN nasal spray Place 1 spray into both nostrils as needed (dryness).     No current facility-administered medications for this visit.     OBJECTIVE: Older African-American woman wearing oxygen by nasal cannula  Vitals:   12/11/18 1430  BP: (!) 149/61  Pulse: 89  Resp: 18  Temp: 98.3 F (36.8 C)  SpO2: 95%     Body mass index is 36.96 kg/m.   Wt Readings from Last 3 Encounters:  12/11/18 218 lb 11.2 oz (99.2 kg)  11/02/18 220 lb (99.8 kg)  07/20/18 216 lb (98 kg)      ECOG FS:2 - Symptomatic, <50% confined to bed  Sclerae unicteric, EOMs intact Wearing a mask No cervical or supraclavicular adenopathy Lungs no rales or rhonchi Heart regular rate and rhythm Abd soft, obese, nontender, positive bowel sounds MSK no focal spinal tenderness, no upper extremity lymphedema Neuro: nonfocal, well oriented, appropriate affect Breasts: Deferred    LAB RESULTS:  CMP     Component Value Date/Time   NA 137 12/11/2018 1403   NA 143 03/13/2018 1539   K 3.1 (L) 12/11/2018 1403   CL 93 (L) 12/11/2018 1403   CO2 33 (H)  12/11/2018 1403   GLUCOSE 278 (H) 12/11/2018 1403   BUN 20 12/11/2018 1403   BUN 16 03/13/2018 1539   CREATININE 1.13 (H) 12/11/2018 1403   CALCIUM 9.6 12/11/2018 1403   PROT 7.3 12/11/2018 1403   PROT 6.9 03/13/2018 1539   ALBUMIN 3.6 12/11/2018 1403   ALBUMIN 4.3 03/13/2018 1539   AST 11 (L) 12/11/2018 1403   ALT 7 12/11/2018 1403   ALKPHOS 173 (H) 12/11/2018 1403   BILITOT 0.3 12/11/2018 1403   GFRNONAA 48 (L) 12/11/2018 1403   GFRAA 55 (L) 12/11/2018 1403    Lab Results  Component Value Date   TOTALPROTELP 6.7 02/28/2014   ALBUMINELP 57.4 02/28/2014   A1GS 4.9 02/28/2014   A2GS 8.5 02/28/2014   BETS 7.5 (H) 02/28/2014   BETA2SER 5.3 02/28/2014   GAMS 16.4 02/28/2014   MSPIKE 0.31 02/28/2014   SPEI SEE NOTE 02/28/2014    No results found for: KPAFRELGTCHN, LAMBDASER, KAPLAMBRATIO  Lab Results  Component Value Date   WBC 11.4 (H) 12/11/2018   NEUTROABS 8.4 (H) 12/11/2018   HGB 13.4 12/11/2018   HCT 39.7 12/11/2018   MCV 83.9 12/11/2018   PLT 285 12/11/2018    '@LASTCHEMISTRY' @  No results found for: LABCA2  No components found for: [id number]  No results for input(s): INR in Victoria last 168 hours.  No results found for: LABCA2  No results found for: UKG254  No results found for: YHC623  No results found for: JSE831  No results found for: CA2729  No components found for: HGQUANT  No results found for: CEA1 / No results found for: CEA1   No results found for: AFPTUMOR  No results found for: CHROMOGRNA  No results found for: PSA1  Appointment on 12/11/2018  Component Date Value Ref Range Status  Sodium 12/11/2018 137  135 - 145 mmol/L Final   Potassium 12/11/2018 3.1* 3.5 - 5.1 mmol/L Final   Chloride 12/11/2018 93* 98 - 111 mmol/L Final   CO2 12/11/2018 33* 22 - 32 mmol/L Final   Glucose, Bld 12/11/2018 278* 70 - 99 mg/dL Final   BUN 12/11/2018 20  8 - 23 mg/dL Final   Creatinine 12/11/2018 1.13* 0.44 - 1.00 mg/dL Final   Calcium  12/11/2018 9.6  8.9 - 10.3 mg/dL Final   Total Protein 12/11/2018 7.3  6.5 - 8.1 g/dL Final   Albumin 12/11/2018 3.6  3.5 - 5.0 g/dL Final   AST 12/11/2018 11* 15 - 41 U/L Final   ALT 12/11/2018 7  0 - 44 U/L Final   Alkaline Phosphatase 12/11/2018 173* 38 - 126 U/L Final   Total Bilirubin 12/11/2018 0.3  0.3 - 1.2 mg/dL Final   GFR, Est Non Af Am 12/11/2018 48* >60 mL/min Final   GFR, Est AFR Am 12/11/2018 55* >60 mL/min Final   Anion gap 12/11/2018 11  5 - 15 Final   Performed at [name], [place] [address], [place], [place] [address]   WBC Count 12/11/2018 11.4* 4.0 - 10.5 K/uL Final   RBC 12/11/2018 4.73  3.87 - 5.11 MIL/uL Final   Hemoglobin 12/11/2018 13.4  12.0 - 15.0 g/dL Final   HCT 12/11/2018 39.7  36.0 - 46.0 % Final   MCV 12/11/2018 83.9  80.0 - 100.0 fL Final   MCH 12/11/2018 28.3  26.0 - 34.0 pg Final   MCHC 12/11/2018 33.8  30.0 - 36.0 g/dL Final   RDW 12/11/2018 13.0  11.5 - 15.5 % Final   Platelet Count 12/11/2018 285  150 - 400 K/uL Final   nRBC 12/11/2018 0.0  0.0 - 0.2 % Final   Neutrophils Relative % 12/11/2018 75  % Final   Neutro Abs 12/11/2018 8.4* 1.7 - 7.7 K/uL Final   Lymphocytes Relative 12/11/2018 17  % Final   Lymphs Abs 12/11/2018 1.9  0.7 - 4.0 K/uL Final   Monocytes Relative 12/11/2018 7  % Final   Monocytes Absolute 12/11/2018 0.8  0.1 - 1.0 K/uL Final   Eosinophils Relative 12/11/2018 1  % Final   Eosinophils Absolute 12/11/2018 0.1  0.0 - 0.5 K/uL Final   Basophils Relative 12/11/2018 0  % Final   Basophils Absolute 12/11/2018 0.1  0.0 - 0.1 K/uL Final   Immature Granulocytes 12/11/2018 0  % Final   Abs Immature Granulocytes 12/11/2018 0.05  0.00 - 0.07 K/uL Final   Performed at [name], [address] [place], [place] [address]    (this displays Victoria last labs from Victoria last 3 days)  Lab Results  Component Value Date   TOTALPROTELP 6.7 02/28/2014    ALBUMINELP 57.4 02/28/2014   A1GS 4.9 02/28/2014   A2GS 8.5 02/28/2014   BETS 7.5 (H) 02/28/2014   BETA2SER 5.3 02/28/2014   GAMS 16.4 02/28/2014   MSPIKE 0.31 02/28/2014   SPEI SEE NOTE 02/28/2014   (this displays SPEP labs)  No results found for: KPAFRELGTCHN, LAMBDASER, KAPLAMBRATIO (kappa/lambda light chains)  No results found for: HGBA, HGBA2QUANT, HGBFQUANT, HGBSQUAN (Hemoglobinopathy evaluation)   No results found for: LDH  No results found for: IRON, TIBC, IRONPCTSAT (Iron and TIBC)  No results found for: FERRITIN  Urinalysis    Component Value Date/Time   COLORURINE YELLOW 01/18/2017 [place] 01/18/2017 1605   LABSPEC 1.015 01/18/2017 1605  PHURINE 7.0 01/18/2017 1605   GLUCOSEU NEGATIVE 01/18/2017 1605   HGBUR NEGATIVE 01/18/2017 1605   BILIRUBINUR NEGATIVE 01/18/2017 1605   KETONESUR NEGATIVE 01/18/2017 1605   PROTEINUR 30 (A) 04/18/2011 1942   UROBILINOGEN 0.2 01/18/2017 1605   NITRITE NEGATIVE 01/18/2017 1605   LEUKOCYTESUR NEGATIVE 01/18/2017 1605     STUDIES: Ct Coronary Morph W/cta Cor W/score W/ca W/cm &/or Wo/cm  Result Date: 11/23/2018 EXAM: OVER-READ INTERPRETATION  CT CHEST Victoria following report is an over-read performed by radiologist Dr. Vinnie Langton of [name], [place] on 11/23/2018. This over-read does not include interpretation of cardiac or coronary anatomy or pathology. Victoria coronary calcium score/coronary CTA interpretation by Victoria cardiologist is attached. COMPARISON:  Cardiac CTA 03/14/2018. FINDINGS: Aortic atherosclerosis. Mild diffuse bronchial wall thickening with mild to moderate centrilobular emphysema. Within Victoria visualized portions of Victoria thorax there are no suspicious appearing pulmonary nodules or masses, there is no acute consolidative airspace disease, no pleural effusions, no pneumothorax and no lymphadenopathy. Visualized portions of Victoria upper abdomen are unremarkable. There are no aggressive appearing  lytic or blastic lesions noted in Victoria visualized portions of Victoria skeleton. IMPRESSION: 1.  Aortic Atherosclerosis (ICD10-I70.0). 2.  Emphysema (ICD10-J43.9). Electronically Signed   By: Vinnie Langton M.D.   On: 11/23/2018 12:13   Ct Coronary Fractional Flow Reserve Fluid Analysis  Result Date: 11/27/2018 EXAM: FFRCT ANALYSIS FINDINGS: FFRct analysis was performed on Victoria original cardiac CT angiogram dataset. Diagrammatic representation of Victoria FFRct analysis is provided in a separate PDF document in PACS. This dictation was created using Victoria PDF document and an interactive 3D model of Victoria results. 3D model is not available in Victoria EMR/PACS. Normal FFR range is >0.80. EXAM: CT FFR ANALYSIS CLINICAL DATA:  Moderate CAD of Victoria proximal LAD and RCA. FINDINGS: FFRct analysis was performed on Victoria original cardiac CT angiogram dataset. Diagrammatic representation of Victoria FFRct analysis is provided in a separate PDF document in PACS. This dictation was created using Victoria PDF document and an interactive 3D model of Victoria results. 3D model is not available in Victoria EMR/PACS. Normal FFR range is >0.80. 1. Left Main:  No significant stenosis.  FFR = 0.98. 2. LAD: No significant stenosis. Proximal FFR = 0.96, Mid FFR = 0.96, Distal FFR = 0.81. Diagonal: No significant stenosis. Proximal diagonal = 0.95, mid diagonal 0.92, distal diagonal = 0.85. 3. LCX: No significant stenosis. Proximal FFR = 0.97, Distal FFR = 0.91. 4. Ramus: No significant stenosis. Proximal FFR = 0.99, Distal FFR = 0.94. 5. RCA: No significant stenosis. Proximal FFR = 0.99, Mid FFR = 0.95, PDA FFR = 0.91. IMPRESSION: 1. CT FFR analysis showed no hemodynamically significant stenosis in Victoria LAD or RCA. Electronically Signed   By: Fransico Him   On: 11/27/2018 21:58   Mm Diag Breast Tomo Bilateral  Result Date: 12/04/2018 CLINICAL DATA:  History of left breast cancer status post lumpectomy in 2019. EXAM: DIGITAL DIAGNOSTIC BILATERAL MAMMOGRAM WITH CAD AND  TOMO COMPARISON:  Previous exam(s). ACR Breast Density Category b: There are scattered areas of fibroglandular density. FINDINGS: Lumpectomy changes are seen in Victoria left breast. No suspicious mass or malignant type microcalcifications identified in either breast. Mammographic images were processed with CAD. IMPRESSION: No evidence of malignancy in either breast. RECOMMENDATION: Bilateral diagnostic mammogram in 1 year is recommended. I have discussed Victoria findings and recommendations with Victoria patient. Results were also provided in writing at Victoria conclusion of Victoria visit. If applicable, a reminder letter will be sent  to Victoria patient regarding Victoria next appointment. BI-RADS CATEGORY  2: Benign. Electronically Signed   By: Lillia Mountain M.D.   On: 12/04/2018 11:57    ELIGIBLE FOR AVAILABLE RESEARCH PROTOCOL: no  ASSESSMENT: 73 y.o. [place] woman status post left breast upper outer quadrant biopsy 06/24/2017 for a clinical T1b N0, stage IA invasive ductal carcinoma, grade 1, estrogen and progesterone receptor positive, HER-2 not amplified, with an MIB-105%  (1) status post left lumpectomy without sentinel lymph node sampling on 08/10/2017 showed invasive ductal carcinoma grade II, spanning 1.2 cm. High grade DCIS. Anterior margin was was focally positive for invasive ductal carcinoma. All other margins clear.   (a) additional surgery for margin clearance 08/31/2017 showed atypical ductal hyperplasia, negative margins  (2) given Victoria patient's age, Victoria tumor size, grade and prognostic profile, will forego Oncotype and avoid chemotherapy  (3) may forgo adjuvant radiation if comfortable with anti-estrogen therapy  (4) anastrozole started 08/30/2017  (5) osteoporosis: bone density on 02/27/2014 showed a T score of -2.9   (a) bone density on 12/08/2016 showed a T score of -2.4 (osteopenia).  (6) anastrozole to start 09/14/2017  (7) incidentally noted adrenal adenomas stable on most recent scan  (10/28/2017).  (8) lung cancer screening:  (a) low-dose CT of Victoria chest 10/28/2017 was benign.  There was significant aortic atherosclerosis and left main coronary artery disease, severe emphysema, and likely benign adrenal lesions   PLAN: Dyann Ruddle is now a little over a year out from definitive surgery for her breast cancer with no evidence of disease progression.  This is favorable.  She is tolerating anastrozole well and Victoria plan will be to continue that a minimum of 5 years.  She has a high blood sugar.  She is not on treatment for diabetes today other than diet and she is not compliant with that.  We discussed that in detail.  She will be seeing her endocrinologist she tells me soon to discuss that further.  Her alkaline phosphatase is rising.  Likely this is due to fatty liver.  If it is not better at Victoria next visit we will obtain a liver ultrasound at that point  Otherwise Victoria plan is to continue anastrozole as currently prescribed.  We will obtain a repeat bone density with mammography next year  She knows to call for any other issue that may develop before Victoria next visit.   Vennesa Bastedo, Victoria Dad, MD  12/11/18 3:32 PM Medical Oncology and Hematology [name] [address] [place], [place] [address] Tel. [phone]    Fax. [phone]    I, Wilburn Mylar, am acting as scribe for Dr. Virgie [name]. Chidera Thivierge.  I, Lurline Del MD, have reviewed Victoria above documentation for accuracy and completeness, and I agree with Victoria above.

## 2018-12-11 ENCOUNTER — Other Ambulatory Visit: Payer: Self-pay

## 2018-12-11 ENCOUNTER — Other Ambulatory Visit: Payer: Self-pay | Admitting: Oncology

## 2018-12-11 ENCOUNTER — Inpatient Hospital Stay: Payer: Medicare Other

## 2018-12-11 ENCOUNTER — Encounter: Payer: Self-pay | Admitting: Oncology

## 2018-12-11 ENCOUNTER — Inpatient Hospital Stay: Payer: Medicare Other | Attending: Oncology | Admitting: Oncology

## 2018-12-11 VITALS — BP 149/61 | HR 89 | Temp 98.3°F | Resp 18 | Ht 64.5 in | Wt 218.7 lb

## 2018-12-11 DIAGNOSIS — R748 Abnormal levels of other serum enzymes: Secondary | ICD-10-CM | POA: Diagnosis not present

## 2018-12-11 DIAGNOSIS — M818 Other osteoporosis without current pathological fracture: Secondary | ICD-10-CM | POA: Diagnosis not present

## 2018-12-11 DIAGNOSIS — Z170 Estrogen receptor positive status [ER+]: Secondary | ICD-10-CM

## 2018-12-11 DIAGNOSIS — C50412 Malignant neoplasm of upper-outer quadrant of left female breast: Secondary | ICD-10-CM | POA: Diagnosis not present

## 2018-12-11 DIAGNOSIS — Z79811 Long term (current) use of aromatase inhibitors: Secondary | ICD-10-CM

## 2018-12-11 LAB — CMP (CANCER CENTER ONLY)
ALT: 7 U/L (ref 0–44)
AST: 11 U/L — ABNORMAL LOW (ref 15–41)
Albumin: 3.6 g/dL (ref 3.5–5.0)
Alkaline Phosphatase: 173 U/L — ABNORMAL HIGH (ref 38–126)
Anion gap: 11 (ref 5–15)
BUN: 20 mg/dL (ref 8–23)
CO2: 33 mmol/L — ABNORMAL HIGH (ref 22–32)
Calcium: 9.6 mg/dL (ref 8.9–10.3)
Chloride: 93 mmol/L — ABNORMAL LOW (ref 98–111)
Creatinine: 1.13 mg/dL — ABNORMAL HIGH (ref 0.44–1.00)
GFR, Est AFR Am: 55 mL/min — ABNORMAL LOW (ref >60)
GFR, Estimated: 48 mL/min — ABNORMAL LOW (ref >60)
Glucose, Bld: 278 mg/dL — ABNORMAL HIGH (ref 70–99)
Potassium: 3.1 mmol/L — ABNORMAL LOW (ref 3.5–5.1)
Sodium: 137 mmol/L (ref 135–145)
Total Bilirubin: 0.3 mg/dL (ref 0.3–1.2)
Total Protein: 7.3 g/dL (ref 6.5–8.1)

## 2018-12-11 LAB — CBC WITH DIFFERENTIAL (CANCER CENTER ONLY)
Abs Immature Granulocytes: 0.05 10*3/uL (ref 0.00–0.07)
Basophils Absolute: 0.1 10*3/uL (ref 0.0–0.1)
Basophils Relative: 0 %
Eosinophils Absolute: 0.1 10*3/uL (ref 0.0–0.5)
Eosinophils Relative: 1 %
HCT: 39.7 % (ref 36.0–46.0)
Hemoglobin: 13.4 g/dL (ref 12.0–15.0)
Immature Granulocytes: 0 %
Lymphocytes Relative: 17 %
Lymphs Abs: 1.9 10*3/uL (ref 0.7–4.0)
MCH: 28.3 pg (ref 26.0–34.0)
MCHC: 33.8 g/dL (ref 30.0–36.0)
MCV: 83.9 fL (ref 80.0–100.0)
Monocytes Absolute: 0.8 10*3/uL (ref 0.1–1.0)
Monocytes Relative: 7 %
Neutro Abs: 8.4 10*3/uL — ABNORMAL HIGH (ref 1.7–7.7)
Neutrophils Relative %: 75 %
Platelet Count: 285 10*3/uL (ref 150–400)
RBC: 4.73 MIL/uL (ref 3.87–5.11)
RDW: 13 % (ref 11.5–15.5)
WBC Count: 11.4 10*3/uL — ABNORMAL HIGH (ref 4.0–10.5)
nRBC: 0 % (ref 0.0–0.2)

## 2018-12-12 ENCOUNTER — Telehealth: Payer: Self-pay | Admitting: Adult Health

## 2018-12-12 ENCOUNTER — Telehealth: Payer: Self-pay | Admitting: Internal Medicine

## 2018-12-12 DIAGNOSIS — J449 Chronic obstructive pulmonary disease, unspecified: Secondary | ICD-10-CM | POA: Diagnosis not present

## 2018-12-12 NOTE — Telephone Encounter (Signed)
Please advise OV with me this week

## 2018-12-12 NOTE — Telephone Encounter (Signed)
Pt is scheduled tomorrow.

## 2018-12-12 NOTE — Telephone Encounter (Signed)
Please refer to Dr. Cordelia Pen request for this pt to be scheduled an appt with him this week

## 2018-12-12 NOTE — Telephone Encounter (Signed)
Pt is calling to schedule an appt with Dr Cruzita Lederer for her Thyroid and new problem - elevated blood sugars. Pt last seen in 2019 with Dr Cruzita Lederer for TSH.  Pt states that she recently had labs done with the [facility] and her glucose levels were elevated and she was advised to follow up with Dr Cruzita Lederer for management of this soon.   CMP ([place] only) Order: [id number] Status:  Final result Visible to patient:  No (not released) Next appt:  01/10/2019 at 11:30 AM in Radiology (LBCT-CT 1) Dx:  Malignant neoplasm of upper-outer qua...  Ref Range & Units 1d ago 2wk ago  Sodium 135 - 145 mmol/L 137    Potassium 3.5 - 5.1 mmol/L 3.1Low     Chloride 98 - 111 mmol/L 93Low     CO2 22 - 32 mmol/L 33High     Glucose, Bld 70 - 99 mg/dL 278High     BUN 8 - 23 mg/dL 20    Creatinine 0.44 - 1.00 mg/dL 1.13High   0.60   Calcium 8.9 - 10.3 mg/dL 9.6    Total Protein 6.5 - 8.1 g/dL 7.3    Albumin 3.5 - 5.0 g/dL 3.6    AST 15 - 41 U/L 11Low     ALT 0 - 44 U/L 7    Alkaline Phosphatase 38 - 126 U/L 173High     Total Bilirubin 0.3 - 1.2 mg/dL 0.3    GFR, Est Non Af Am >60 mL/min 48Low     GFR, Est AFR Am >60 mL/min 55Low     Anion gap 5 - 15 11         Since this is a new problem and she has never been seen for this in our office but is established under Dr Cruzita Lederer for other medical conditions, are we able to see her for this new problem? Pt aware that Dr Cruzita Lederer is out of office for the next 3 weeks but that we will get back with her as soon as we are able.   Will send to Dr Loanne Drilling for recommendations.

## 2018-12-12 NOTE — Telephone Encounter (Signed)
I talk with patient regarding schedule  

## 2018-12-13 ENCOUNTER — Ambulatory Visit: Payer: Medicare Other | Admitting: Endocrinology

## 2018-12-13 ENCOUNTER — Telehealth: Payer: Self-pay | Admitting: Endocrinology

## 2018-12-13 ENCOUNTER — Other Ambulatory Visit: Payer: Self-pay

## 2018-12-13 ENCOUNTER — Encounter: Payer: Self-pay | Admitting: Endocrinology

## 2018-12-13 VITALS — BP 160/64 | HR 89 | Ht 64.5 in | Wt 220.8 lb

## 2018-12-13 DIAGNOSIS — E118 Type 2 diabetes mellitus with unspecified complications: Secondary | ICD-10-CM

## 2018-12-13 DIAGNOSIS — R609 Edema, unspecified: Secondary | ICD-10-CM

## 2018-12-13 DIAGNOSIS — E1151 Type 2 diabetes mellitus with diabetic peripheral angiopathy without gangrene: Secondary | ICD-10-CM

## 2018-12-13 LAB — POCT GLYCOSYLATED HEMOGLOBIN (HGB A1C): Hemoglobin A1C: 10.3 % — AB (ref 4.0–5.6)

## 2018-12-13 MED ORDER — ACCU-CHEK AVIVA VI STRP: 1.0000 | ORAL_STRIP | Freq: Every day | 12 refills | Status: DC

## 2018-12-13 MED ORDER — JANUMET XR 50-1000 MG PO TB24: 1.0000 | ORAL_TABLET | Freq: Every day | ORAL | 11 refills | Status: DC

## 2018-12-13 MED ORDER — ACCU-CHEK AVIVA PLUS W/DEVICE KIT: 1.0000 | PACK | Freq: Every day | 0 refills | Status: DC

## 2018-12-13 NOTE — Telephone Encounter (Signed)
E11.9

## 2018-12-13 NOTE — Patient Instructions (Addendum)
Your blood pressure is high today.  Please see your primary care provider soon, to have it rechecked I have sent a prescription to your pharmacy, for a meter and strips.   check your blood sugar once a day.  vary the time of day when you check, between before the 3 meals, and at bedtime.  also check if you have symptoms of your blood sugar being too high or too low.  please keep a record of the readings and bring it to your next appointment here (or you can bring the meter itself).  You can write it on any piece of paper.  please call us sooner if your blood sugar goes below 70, or if you have a lot of readings over 200. Please come back for a follow-up appointment in 3-4 weeks.

## 2018-12-13 NOTE — Progress Notes (Signed)
Subjective:    Patient ID: Victoria [name], female    DOB: 1946-01-21, 73 y.o.   MRN: [id number]  HPI Pt returns for f/u of diabetes mellitus: DM type: 2 Dx'ed: 5102 Complications: CAD and PAD Therapy: none GDM: never DKA: never Severe hypoglycemia: never Pancreatitis: never Pancreatic imaging: normal on 2008 CT Other: she has never been on DM rx Interval history: no recent steroids.  pt states she feels well in general.  She does not check cbg's.  Past Medical History:  Diagnosis Date  . Abdominal aneurysm ([place])    Dr. Oneida Alar follows lLOV 2 ''17 per pt "around 2 cm"  . Anemia    as a child  . Arthritis    left ankle, right knee, right SI joint, wrists, lower back  . Breast cancer in female [facility])    Left  . COPD (chronic obstructive pulmonary disease) ([place])    ephysema-Dr. Chase Caller  . Dyspnea   . Headache    as a child would have terrible headaches during season changes  . 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 Hyperlipidemia   . Hypertension   . Multiple thyroid nodules   . Murmur, cardiac 1950  . Osteoporosis   . Pneumonia   . Pre-diabetes   . Requires continuous at home supplemental oxygen    2 L 24/7  . Sebaceous cyst    hairline sebaceous cyst left posterior neck to be excised 04-13-16 by Dr. Harlow Asa in [place] [facility]  . Sleep apnea    cpap used sometimes, uses oxygen concentrator 2 l/m nasally bedtime  . Varicella as child    Past Surgical History:  Procedure Laterality Date  . APPENDECTOMY     2008  . BREAST LUMPECTOMY Left 08/31/2017  . BREAST LUMPECTOMY WITH RADIOACTIVE SEED LOCALIZATION Left 08/10/2017   Procedure: BREAST LUMPECTOMY WITH RADIOACTIVE SEED LOCALIZATION;  Surgeon: Rolm Bookbinder, MD;  [facility];  Service: General;  Laterality: Left;  . [place]  . COLONOSCOPY    . COLONOSCOPY WITH PROPOFOL N/A 04/16/2016   Procedure: COLONOSCOPY WITH PROPOFOL;  Surgeon: Carol Ada, MD;  [facility];  Service: Endoscopy;  Laterality: N/A;  . CYST REMOVAL NECK Left 04/13/2016   Procedure: EXCISION OF SEBACEOUS CYST LEFT POSTERIOR NECK;  Surgeon: Armandina Gemma, MD;  Location: [place];  Service: General;  Laterality: Left;  . Excision of Pelvic Absess, Right Ovary     2008  . RE-EXCISION OF BREAST CANCER,SUPERIOR MARGINS Left 08/31/2017   Procedure: RE-EXCISION OF LEFT  BREAST MARGINS ERAS PATHWAY;  Surgeon: Rolm Bookbinder, MD;  Location: [place];  Service: General;  Laterality: Left;  . TUBAL LIGATION  1980    Social History   Socioeconomic History  . Marital status: Divorced    Spouse name: Not on file  . Number of children: 1  . Years of education: 13  . Highest education level: Not on file  Occupational History  . Occupation: Social worker, Technical sales engineer: [employer]  Social Needs  . Financial resource strain: Not very hard  . Food insecurity    Worry: Never true    Inability: Never true  . Transportation needs    Medical: No    Non-medical: No  Tobacco Use  . Smoking status: Former Smoker    Packs/day: 1.00    Years: 50.00    Pack years: 50.00    Types: Cigarettes    Quit  date: 04/16/2011    Years since quitting: 7.6  . Smokeless tobacco: Never Used  . Tobacco comment: quit that date when she had to go to ER   Substance and Sexual Activity  . Alcohol use: Yes    Alcohol/week: 0.0 standard drinks    Comment: rare occasion  . Drug use: No    Comment: no marijuana since 2012  . Sexual activity: Not Currently  Lifestyle  . Physical activity    Days per week: 0 days    Minutes per session: 0 min  . Stress: Only a little  Relationships  . Social connections    Talks on phone: More than three times a week    Gets together: More than three times a week    Attends religious service: More than 4 times per year    Active member of club or organization: Yes    Attends meetings of clubs or organizations: More than 4 times per year    Relationship  status: Divorced  . Intimate partner violence    Fear of current or ex partner: Not on file    Emotionally abused: Not on file    Physically abused: Not on file    Forced sexual activity: Not on file  Other Topics Concern  . Not on file  Social History Narrative   HSG; [name], [place]. . Married '69 - 9 yrs/divorced. 1 son - '72; no grandchildren.   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 No h/o physical or sexual abuse. ACP - no living will - wants information. Provided packet of information. On 08/05/2011: she stated she was distant cousins to celebrities [name] and [name]       Current Outpatient Medications on File Prior to Visit  Medication Sig Dispense Refill  . acetaminophen (TYLENOL) 500 MG tablet Take 1,000 mg by mouth every 6 (six) hours as needed.    . Albuterol Sulfate (PROAIR RESPICLICK) 867 (90 Base) MCG/ACT AEPB Inhale 1-2 puffs into the lungs every 6 (six) hours as needed (for wheezing/shortness of breath). 1 each 0  . amLODipine (NORVASC) 10 MG tablet Take 1 tablet by mouth once daily 90 tablet 0  . anastrozole (ARIMIDEX) 1 MG tablet Take 1 tablet (1 mg total) by mouth daily. 90 tablet 4  . atorvastatin (LIPITOR) 80 MG tablet Take 1 tablet (80 mg total) by mouth daily at 6 PM. 90 tablet 3  . chlorthalidone (HYGROTON) 25 MG tablet Take 1 tablet (25 mg total) by mouth daily. 90 tablet 3  . Cholecalciferol (VITAMIN D3) 2000 units TABS Take 4,000 Units by mouth daily.    [name] denosumab (PROLIA) 60 MG/ML SOLN injection Inject 60 mg into the skin every 6 (six) months. Administer in upper arm, thigh, or abdomen    . doxycycline (VIBRA-TABS) 100 MG tablet Take 1 tablet (100 mg total) by mouth 2 (two) times daily. 10 tablet 0  . Fluticasone-Umeclidin-Vilant (TRELEGY ELLIPTA) 100-62.5-25 MCG/INH AEPB Inhale 62.5 mcg into the lungs daily. 90 each 3  . furosemide (LASIX) 20 MG tablet furosemide 20 mg tablet  TAKE 1  TABLET BY MOUTH ONCE DAILY    . hydrochlorothiazide (HYDRODIURIL) 25 MG tablet Take 1 tablet (25 mg total) by mouth daily. 90 tablet 3  . ipratropium-albuterol (DUONEB) 0.5-2.5 (3) MG/3ML SOLN USE ONE VIAL IN NEBULIZER 4 TIMES DAILY (Patient taking differently: USE ONE VIAL IN NEBULIZER 4 TIMES DAILY) 360 mL 5  . OXYGEN Inhale 2-3 L into the lungs  continuous. When exerting self     . potassium chloride SA (K-DUR,KLOR-CON) 20 MEQ tablet Take 1 tablet (20 mEq total) by mouth 2 (two) times daily. 180 tablet 1  . predniSONE (DELTASONE) 10 MG tablet Take 4tabs x1day, 3tabs x1day,2tabs x1day, 1tab x1day, 0.5tabs x1day, then stop 11 tablet 0  . Roflumilast (DALIRESP) 250 MCG TABS Take 500 mcg by mouth daily. 28 tablet 0  . roflumilast (DALIRESP) 500 MCG TABS tablet Take 1 tablet (500 mcg total) by mouth daily. 30 tablet 11  . sodium chloride (OCEAN) 0.65 % SOLN nasal spray Place 1 spray into both nostrils as needed (dryness).     No current facility-administered medications on file prior to visit.     Allergies  Allergen Reactions  . Benicar [Olmesartan] Swelling    Swelling of face and arms   . [name] [Valsartan] Swelling    Swelling of face and arms   . Hydrocodone-Acetaminophen Nausea And Vomiting    Severe vomiting  . Lisinopril Cough  . Monosodium Glutamate Other (See Comments)    Facial swelling per pt  . Codeine Other (See Comments)    jittery  . Lead Acetate Rash  . Nickel Rash    Severe rash to infection: pt is allergic to all metals other than sterling silver or gold jewelry.     Family History  Problem Relation Age of Onset  . Heart disease Mother        MI - fatal  . Hypertension Mother   . Stroke Father 103       fatal  . Alzheimer's disease Father   . Alzheimer's disease Brother   . Hyperlipidemia Brother   . Hypertension Brother   . Diabetes Brother   . Hypertension Brother   . Hyperlipidemia Brother   . Breast cancer Paternal Grandmother     BP (!) 160/64 (BP  Location: Left Arm, Patient Position: Sitting, Cuff Size: Large)   Pulse 89   Ht 5' 4.5" (1.638 m)   Wt 220 lb 12.8 oz (100.2 kg)   SpO2 92%   BMI 37.31 kg/m    Review of Systems She has gained weight.     Objective:   Physical Exam VITAL SIGNS:  See vs page GENERAL: no distress Pulses: dorsalis pedis intact bilat.   MSK: no deformity of the feet CV: trace bilat leg edema.  Skin:  no ulcer on the feet.  normal color and temp on the feet.  Neuro: sensation is intact to touch on the feet.     Lab Results  Component Value Date   HGBA1C 10.3 (A) 12/13/2018   Lab Results  Component Value Date   CREATININE 1.13 (H) 12/11/2018   BUN 20 12/11/2018   NA 137 12/11/2018   K 3.1 (L) 12/11/2018   CL 93 (L) 12/11/2018   CO2 33 (H) 12/11/2018      Assessment & Plan:  HTN: is noted today Type 2 DM, with PAD: much worse Edema: This limits rx options, but she is already on 2 diuretics, so I hesitate to add SGLT  Patient Instructions  Your blood pressure is high today.  Please see your primary care provider soon, to have it rechecked I have sent a prescription to your pharmacy, for a meter and strips.   check your blood sugar once a day.  vary the time of day when you check, between before the 3 meals, and at bedtime.  also check if you have symptoms of your blood sugar being  too high or too low.  please keep a record of the readings and bring it to your next appointment here (or you can bring the meter itself).  You can write it on any piece of paper.  please call us sooner if your blood sugar goes below 70, or if you have a lot of readings over 200. Please come back for a follow-up appointment in 3-4 weeks.

## 2018-12-13 NOTE — Telephone Encounter (Signed)
Patient states that the pharmacy is needing a DX code for her meter, test strips, etc.   Please Advise, Thanks

## 2018-12-13 NOTE — Telephone Encounter (Signed)
Please refer to the pharmacy's request and include in her orders.

## 2018-12-14 ENCOUNTER — Other Ambulatory Visit: Payer: Self-pay

## 2018-12-14 DIAGNOSIS — E118 Type 2 diabetes mellitus with unspecified complications: Secondary | ICD-10-CM

## 2018-12-14 MED ORDER — ACCU-CHEK AVIVA PLUS W/DEVICE KIT: 1.0000 | PACK | Freq: Every day | 0 refills | Status: DC

## 2018-12-14 MED ORDER — ACCU-CHEK FASTCLIX LANCETS MISC: 1.0000 | Freq: Every day | 2 refills | Status: DC

## 2018-12-14 MED ORDER — ACCU-CHEK AVIVA VI STRP: 1.0000 | ORAL_STRIP | Freq: Every day | 2 refills | Status: DC

## 2018-12-14 NOTE — Telephone Encounter (Signed)
E-Prescribing Status: Receipt confirmed by pharmacy (12/14/2018 7:08 AM EDT)

## 2019-01-05 ENCOUNTER — Other Ambulatory Visit: Payer: Self-pay

## 2019-01-08 DIAGNOSIS — I10 Essential (primary) hypertension: Secondary | ICD-10-CM | POA: Diagnosis not present

## 2019-01-08 DIAGNOSIS — E785 Hyperlipidemia, unspecified: Secondary | ICD-10-CM | POA: Diagnosis not present

## 2019-01-08 LAB — COMPREHENSIVE METABOLIC PANEL
ALT: 8 IU/L (ref 0–32)
AST: 12 IU/L (ref 0–40)
Albumin/Globulin Ratio: 1.6 (ref 1.2–2.2)
Albumin: 4.1 g/dL (ref 3.7–4.7)
Alkaline Phosphatase: 88 IU/L (ref 39–117)
BUN/Creatinine Ratio: 19 (ref 12–28)
BUN: 15 mg/dL (ref 8–27)
Bilirubin Total: 0.3 mg/dL (ref 0.0–1.2)
CO2: 24 mmol/L (ref 20–29)
Calcium: 9.8 mg/dL (ref 8.7–10.3)
Chloride: 95 mmol/L — ABNORMAL LOW (ref 96–106)
Creatinine, Ser: 0.81 mg/dL (ref 0.57–1.00)
GFR calc Af Amer: 83 mL/min/{1.73_m2} (ref >59)
GFR calc non Af Amer: 72 mL/min/{1.73_m2} (ref >59)
Globulin, Total: 2.5 g/dL (ref 1.5–4.5)
Glucose: 138 mg/dL — ABNORMAL HIGH (ref 65–99)
Potassium: 3.2 mmol/L — ABNORMAL LOW (ref 3.5–5.2)
Sodium: 139 mmol/L (ref 134–144)
Total Protein: 6.6 g/dL (ref 6.0–8.5)

## 2019-01-08 LAB — LIPID PANEL
Chol/HDL Ratio: 2.8 ratio (ref 0.0–4.4)
Cholesterol, Total: 149 mg/dL (ref 100–199)
HDL: 53 mg/dL (ref >39)
LDL Calculated: 66 mg/dL (ref 0–99)
Triglycerides: 148 mg/dL (ref 0–149)
VLDL Cholesterol Cal: 30 mg/dL (ref 5–40)

## 2019-01-09 ENCOUNTER — Encounter: Payer: Self-pay | Admitting: Endocrinology

## 2019-01-09 ENCOUNTER — Ambulatory Visit (INDEPENDENT_AMBULATORY_CARE_PROVIDER_SITE_OTHER): Payer: Medicare Other | Admitting: Endocrinology

## 2019-01-09 ENCOUNTER — Other Ambulatory Visit: Payer: Self-pay

## 2019-01-09 VITALS — BP 162/60 | HR 94 | Ht 64.5 in | Wt 216.2 lb

## 2019-01-09 DIAGNOSIS — R609 Edema, unspecified: Secondary | ICD-10-CM

## 2019-01-09 DIAGNOSIS — E0590 Thyrotoxicosis, unspecified without thyrotoxic crisis or storm: Secondary | ICD-10-CM

## 2019-01-09 DIAGNOSIS — E118 Type 2 diabetes mellitus with unspecified complications: Secondary | ICD-10-CM

## 2019-01-09 LAB — TSH: TSH: 0.14 u[IU]/mL — ABNORMAL LOW (ref 0.35–4.50)

## 2019-01-09 LAB — T4, FREE: Free T4: 1.06 ng/dL (ref 0.60–1.60)

## 2019-01-09 NOTE — Patient Instructions (Addendum)
Please continue the same medication. A different type of diabetes blood test is requested for you today.  We'll let you know about the results.  check your blood sugar once a day.  vary the time of day when you check, between before the 3 meals, and at bedtime.  also check if you have symptoms of your blood sugar being too high or too low.  please keep a record of the readings and bring it to your next appointment here (or you can bring the meter itself).  You can write it on any piece of paper.  please call us sooner if your blood sugar goes below 70, or if you have a lot of readings over 200.   Please come back for a follow-up appointment in 3 months.

## 2019-01-09 NOTE — Progress Notes (Signed)
Subjective:    Patient ID: Victoria [name], female    DOB: 1945-07-28, 73 y.o.   MRN: [id number]  HPI Pt returns for f/u of diabetes mellitus: DM type: 2 Dx'ed: 8280 Complications: CAD and PAD Therapy: [place] GDM: never DKA: never Severe hypoglycemia: never Pancreatitis: never Pancreatic imaging: normal on 2008 CT Other: edema limits rx options Interval history: no recent steroids.  pt states she feels well in general.  no cbg record, but states cbg's are in the low-100's.  TSH was low in 2019.   Past Medical History:  Diagnosis Date  . Abdominal aneurysm ([place])    Dr. Oneida Alar follows lLOV 2 ''17 per pt "around 2 cm"  . Anemia    as a child  . Arthritis    left ankle, right knee, right SI joint, wrists, lower back  . Breast cancer in female [facility])    Left  . COPD (chronic obstructive pulmonary disease) ([place])    ephysema-Dr. Chase Caller  . Dyspnea   . Headache    as a child would have terrible headaches during season changes  . 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 Hyperlipidemia   . Hypertension   . Multiple thyroid nodules   . Murmur, cardiac 1950  . Osteoporosis   . Pneumonia   . Pre-diabetes   . Requires continuous at home supplemental oxygen    2 L 24/7  . Sebaceous cyst    hairline sebaceous cyst left posterior neck to be excised 04-13-16 by Dr. Harlow Asa in [facility]  . Sleep apnea    cpap used sometimes, uses oxygen concentrator 2 l/m nasally bedtime  . Varicella as child    Past Surgical History:  Procedure Laterality Date  . APPENDECTOMY     2008  . BREAST LUMPECTOMY Left 08/31/2017  . BREAST LUMPECTOMY WITH RADIOACTIVE SEED LOCALIZATION Left 08/10/2017   Procedure: BREAST LUMPECTOMY WITH RADIOACTIVE SEED LOCALIZATION;  Surgeon: Rolm Bookbinder, MD;  Location: [place];  Service: General;  Laterality: Left;  . [place]  . COLONOSCOPY    . COLONOSCOPY WITH PROPOFOL N/A 04/16/2016   Procedure: COLONOSCOPY WITH PROPOFOL;   Surgeon: Carol Ada, MD;  [facility];  Service: Endoscopy;  Laterality: N/A;  . CYST REMOVAL NECK Left 04/13/2016   Procedure: EXCISION OF SEBACEOUS CYST LEFT POSTERIOR NECK;  Surgeon: Armandina Gemma, MD;  Location: [place];  Service: General;  Laterality: Left;  . Excision of Pelvic Absess, Right Ovary     2008  . RE-EXCISION OF BREAST CANCER,SUPERIOR MARGINS Left 08/31/2017   Procedure: RE-EXCISION OF LEFT  BREAST MARGINS ERAS PATHWAY;  Surgeon: Rolm Bookbinder, MD;  Location: [place];  Service: General;  Laterality: Left;  . TUBAL LIGATION  1980    Social History   Socioeconomic History  . Marital status: Divorced    Spouse name: Not on file  . Number of children: 1  . Years of education: 33  . Highest education level: Not on file  Occupational History  . Occupation: Social worker, Technical sales engineer: [employer]  Social Needs  . Financial resource strain: Not very hard  . Food insecurity    Worry: Never true    Inability: Never true  . Transportation needs    Medical: No    Non-medical: No  Tobacco Use  . Smoking status: Former Smoker    Packs/day: 1.00    Years: 50.00    Pack years: 50.00  Types: Cigarettes    Quit date: 04/16/2011    Years since quitting: 7.7  . Smokeless tobacco: Never Used  . Tobacco comment: quit that date when she had to go to ER   Substance and Sexual Activity  . Alcohol use: Yes    Alcohol/week: 0.0 standard drinks    Comment: rare occasion  . Drug use: No    Comment: no marijuana since 2012  . Sexual activity: Not Currently  Lifestyle  . Physical activity    Days per week: 0 days    Minutes per session: 0 min  . Stress: Only a little  Relationships  . Social connections    Talks on phone: More than three times a week    Gets together: More than three times a week    Attends religious service: More than 4 times per year    Active member of club or organization: Yes    Attends meetings of clubs or organizations: More  than 4 times per year    Relationship status: Divorced  . Intimate partner violence    Fear of current or ex partner: Not on file    Emotionally abused: Not on file    Physically abused: Not on file    Forced sexual activity: Not on file  Other Topics Concern  . Not on file  Social History Narrative   HSG; [name], [place]. . Married '69 - 9 yrs/divorced. 1 son - '72; no grandchildren.   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 No h/o physical or sexual abuse. ACP - no living will - wants information. Provided packet of information. On 08/05/2011: she stated she was distant cousins to celebrities [name] and [name]       Current Outpatient Medications on File Prior to Visit  Medication Sig Dispense Refill  . Accu-Chek FastClix Lancets MISC 1 each by Does not apply route daily. Use to monitor glucose levels once per day; E11.9 102 each 2  . acetaminophen (TYLENOL) 500 MG tablet Take 1,000 mg by mouth every 6 (six) hours as needed.    . Albuterol Sulfate (PROAIR RESPICLICK) 008 (90 Base) MCG/ACT AEPB Inhale 1-2 puffs into the lungs every 6 (six) hours as needed (for wheezing/shortness of breath). 1 each 0  . amLODipine (NORVASC) 10 MG tablet Take 1 tablet by mouth once daily 90 tablet 0  . anastrozole (ARIMIDEX) 1 MG tablet Take 1 tablet (1 mg total) by mouth daily. 90 tablet 4  . atorvastatin (LIPITOR) 80 MG tablet Take 1 tablet (80 mg total) by mouth daily at 6 PM. 90 tablet 3  . Blood Glucose Monitoring Suppl (ACCU-CHEK AVIVA PLUS) w/Device KIT 1 Device by Does not apply route daily. Use to monitor glucose levels once per day; E11.9 1 kit 0  . chlorthalidone (HYGROTON) 25 MG tablet Take 1 tablet (25 mg total) by mouth daily. 90 tablet 3  . Cholecalciferol (VITAMIN D3) 2000 units TABS Take 4,000 Units by mouth daily.    [name] denosumab (PROLIA) 60 MG/ML SOLN injection Inject 60 mg into the skin every 6 (six) months. Administer  in upper arm, thigh, or abdomen    . Fluticasone-Umeclidin-Vilant (TRELEGY ELLIPTA) 100-62.5-25 MCG/INH AEPB Inhale 62.5 mcg into the lungs daily. 90 each 3  . furosemide (LASIX) 20 MG tablet furosemide 20 mg tablet  TAKE 1 TABLET BY MOUTH ONCE DAILY    . glucose blood (ACCU-CHEK AVIVA) test strip 1 each by Other route daily. Use to monitor  glucose levels once per day; E11.9 100 each 2  . hydrochlorothiazide (HYDRODIURIL) 25 MG tablet Take 1 tablet (25 mg total) by mouth daily. 90 tablet 3  . ipratropium-albuterol (DUONEB) 0.5-2.5 (3) MG/3ML SOLN USE ONE VIAL IN NEBULIZER 4 TIMES DAILY (Patient taking differently: USE ONE VIAL IN NEBULIZER 4 TIMES DAILY) 360 mL 5  . OXYGEN Inhale 2-3 L into the lungs continuous. When exerting self     . potassium chloride SA (K-DUR,KLOR-CON) 20 MEQ tablet Take 1 tablet (20 mEq total) by mouth 2 (two) times daily. 180 tablet 1  . Roflumilast (DALIRESP) 250 MCG TABS Take 500 mcg by mouth daily. 28 tablet 0  . roflumilast (DALIRESP) 500 MCG TABS tablet Take 1 tablet (500 mcg total) by mouth daily. 30 tablet 11  . SitaGLIPtin-MetFORMIN HCl (JANUMET XR) 50-1000 MG TB24 Take 1 tablet by mouth daily. 30 tablet 11  . sodium chloride (OCEAN) 0.65 % SOLN nasal spray Place 1 spray into both nostrils as needed (dryness).     No current facility-administered medications on file prior to visit.     Allergies  Allergen Reactions  . Benicar [Olmesartan] Swelling    Swelling of face and arms   . [name] [Valsartan] Swelling    Swelling of face and arms   . Hydrocodone-Acetaminophen Nausea And Vomiting    Severe vomiting  . Lisinopril Cough  . Monosodium Glutamate Other (See Comments)    Facial swelling per pt  . Codeine Other (See Comments)    jittery  . Lead Acetate Rash  . Nickel Rash    Severe rash to infection: pt is allergic to all metals other than sterling silver or gold jewelry.     Family History  Problem Relation Age of Onset  . Heart disease Mother         MI - fatal  . Hypertension Mother   . Stroke Father 30       fatal  . Alzheimer's disease Father   . Alzheimer's disease Brother   . Hyperlipidemia Brother   . Hypertension Brother   . Diabetes Brother   . Hypertension Brother   . Hyperlipidemia Brother   . Breast cancer Paternal Grandmother     BP (!) 162/60 (BP Location: Left Arm, Patient Position: Sitting, Cuff Size: Large)   Pulse 94   Ht 5' 4.5" (1.638 m)   Wt 216 lb 3.2 oz (98.1 kg)   SpO2 93%   BMI 36.54 kg/m   Review of Systems Denies nausea.    Objective:   Physical Exam VITAL SIGNS:  See vs page GENERAL: no distress.  Has 02 Pulses: dorsalis pedis intact bilat.   MSK: no deformity of the feet CV: trace bilat leg edema Skin:  no ulcer on the feet.  normal color and temp on the feet.   Neuro: sensation is intact to touch on the feet Ext: there is bilateral onychomycosis of the toenails.     Lab Results  Component Value Date   HGBA1C 10.3 (A) 12/13/2018   Lab Results  Component Value Date   CREATININE 0.81 01/08/2019   BUN 15 01/08/2019   NA 139 01/08/2019   K 3.2 (L) 01/08/2019   CL 95 (L) 01/08/2019   CO2 24 01/08/2019   Lab Results  Component Value Date   TSH 0.402 (L) 03/13/2018   T3TOTAL 94.0 10/14/2015   T4TOTAL 8.2 01/13/2017       Assessment & Plan:  Type 2 DM: uncertain glycemic control.  Hyperthyroidism: new  to me, uncertain etiology.  Edema: This limits rx options   Patient Instructions  Please continue the same medication. A different type of diabetes blood test is requested for you today.  We'll let you know about the results.  check your blood sugar once a day.  vary the time of day when you check, between before the 3 meals, and at bedtime.  also check if you have symptoms of your blood sugar being too high or too low.  please keep a record of the readings and bring it to your next appointment here (or you can bring the meter itself).  You can write it on any piece of paper.   please call us sooner if your blood sugar goes below 70, or if you have a lot of readings over 200.   Please come back for a follow-up appointment in 3 months.

## 2019-01-10 ENCOUNTER — Ambulatory Visit (INDEPENDENT_AMBULATORY_CARE_PROVIDER_SITE_OTHER)
Admission: RE | Admit: 2019-01-10 | Discharge: 2019-01-10 | Disposition: A | Discharge status: Home / Self Care | Payer: Medicare Other | Source: Ambulatory Visit | Attending: Acute Care | Admitting: Acute Care

## 2019-01-10 DIAGNOSIS — Z87891 Personal history of nicotine dependence: Secondary | ICD-10-CM | POA: Diagnosis not present

## 2019-01-10 DIAGNOSIS — Z122 Encounter for screening for malignant neoplasm of respiratory organs: Secondary | ICD-10-CM

## 2019-01-11 ENCOUNTER — Telehealth: Payer: Self-pay | Admitting: Internal Medicine

## 2019-01-11 ENCOUNTER — Ambulatory Visit: Payer: Medicare Other | Admitting: Cardiovascular Disease

## 2019-01-11 DIAGNOSIS — Z122 Encounter for screening for malignant neoplasm of respiratory organs: Secondary | ICD-10-CM

## 2019-01-11 DIAGNOSIS — Z87891 Personal history of nicotine dependence: Secondary | ICD-10-CM

## 2019-01-11 LAB — FRUCTOSAMINE: Fructosamine: 238 umol/L (ref 205–285)

## 2019-01-11 NOTE — Telephone Encounter (Signed)
Call returned to patient, she is requesting results of her CT done 08/26. I made her aware the NP and Nurse who run that program will be here tomorrow and I would route this message to their box so they are aware she is looking for the results. Voiced understanding.   Denise please advise. Thanks.

## 2019-01-12 DIAGNOSIS — J449 Chronic obstructive pulmonary disease, unspecified: Secondary | ICD-10-CM | POA: Diagnosis not present

## 2019-01-12 NOTE — Telephone Encounter (Signed)
Pt informed of CT results per Sarah Groce, NP.  PT verbalized understanding.  Copy sent to PCP.  Order placed for 1 yr f/u CT.  

## 2019-01-17 ENCOUNTER — Other Ambulatory Visit: Payer: Self-pay

## 2019-01-17 ENCOUNTER — Ambulatory Visit: Payer: Medicare Other | Admitting: Internal Medicine

## 2019-01-17 ENCOUNTER — Encounter: Payer: Self-pay | Admitting: Internal Medicine

## 2019-01-17 VITALS — BP 150/78 | HR 85 | Temp 97.5°F | Ht 64.5 in | Wt 214.4 lb

## 2019-01-17 DIAGNOSIS — J449 Chronic obstructive pulmonary disease, unspecified: Secondary | ICD-10-CM

## 2019-01-17 DIAGNOSIS — J9611 Chronic respiratory failure with hypoxia: Secondary | ICD-10-CM | POA: Diagnosis not present

## 2019-01-17 DIAGNOSIS — Z23 Encounter for immunization: Secondary | ICD-10-CM | POA: Diagnosis not present

## 2019-01-17 NOTE — Patient Instructions (Addendum)
ICD-10-CM   1. COPD, severe ([place])  J44.9   2. Chronic respiratory failure with hypoxia ([facility])  J96.11     Stable disease  Plan  - high dose flu shot 01/17/2019 - continue trelegy, o2 and daliresp  - daily saline nasal spray for nasal dryness stemming from oxygen - repeat low dose lung cancer screening CT in 1 year - can order at followup - Please talk to PCP Janith Lima, MD -  and ensure you get  shingrix ([place]) inactivated vaccine against shingles   Followup 6 months or sooner if needed - CAT score at followup

## 2019-01-17 NOTE — Progress Notes (Signed)
OV 07/20/2017  Chief Complaint  Patient presents with  . Follow-up    Follow up visit. Pt also needing surgical clearance for lumpectomy.  Pt saw RB beginning February annd was givien prednisone. Pt states breathing has been tolerable with not taking prednisone. DME: [employer], 2L continuous at home 3L pulse when out     Follow-up severe COPD patient with obesity and sleep apnea.  She says she is compliant with all her COPD inhalers.  She is also compliant with her CPAP.  She is not smoking anymore.  Overall COPD CAT score is 16.  She uses oxygen all the time and CPAP at night.  She is due for breast surgery lumpectomy and is here for a preoperative evaluation.  She tells me that she is in good nutritional status.  She is not anemic.  She has got good kidney function.  She says the surgery will be elective and less than 2 hours and does not involve operating on the neck or the chest or the abdomen.  It will be done at [facility] and under experience surgeon.  Overall she is very functional and active despite his severe COPD.   OV 09/14/2017  Chief Complaint  Patient presents with  . Follow-up    Pt states she has been doing better since her visit with Dr. Halford Chessman last week. Pt is currently taking an abx and just finished last dose of prednisone today. Pt's cough has become better and is still wheezing some. SOB is about the same.    Follow-up advanced COPD with obesity and sleep apnea and chronic hypoxemic respiratory failure  I last saw her in March 2019 which itself followed in exacerbation.  Since then she has had 2 visits in my office with 2 providers for COPD exacerbation.  Most recently September 09, 2017.  Chest x-ray reviewed but did not visualize and the x-ray is clear.  [Of note she had low-dose CT scan of the chest May 2018 and is due for one-year CT scan coming up anytime now] her baseline COPD CAT scan.  She is using oxygen between 2-3 L.  She feels her exacerbation is slowly  resolving but she is still symptomatic COPD CAT score is 24 and much worse than baseline although this is improved compared to last week.  She did have breast cancer surgery recently and this went well.  Currently breast cancer in remission.  Review of her medication shows that while she was in the hospital nebulized bronchodilator long-acting beta agonist was tried and she really liked it.  Therefore on September 09, 2017 visit her combination inhaler Stiolto was changed to [place] she is here to start this.and Yupelri.  Review of her chart shows that she is not on inhaled corticosteroid.  Sometime back she was on Arnuity but she is not taking it due to lack of perceived benefit.  But now she is having recurrent exacerbations.  Therefore she is open to starting it again     OV 02/09/2018  Subjective:  Patient ID: Victoria [name], female , DOB: [birth year] , age 73 y.o. , MRN: [id number] , ADDRESS: [address] [place] [id number]   02/09/2018 -   Chief Complaint  Patient presents with  . Follow-up    Pt states she has been okay since last visit. States she is still having problems becoming SOB when she exerts herself and has occ cough when she has the SOB. Denies any complaints of wheezing or  CP. Pt does develop chest tightness when she has problems with SOB.     HPI Victoria [name] 73 y.o. -with advanced COPD and chronic hypoxemic respiratory failure.  Presents for routine follow-up.  Few weeks ago she had an exacerbation recall an antibiotic and prednisone.  After that she is improved back to her baseline with COPD CAT score shown below 15 and the symptomatology described.  She has new onset of pedal edema for the last 3 days.  This is associated with increased dietary intake of soda and also salted chips.  There is no fever or rash or wheezing.  Her COPD medications include nebulizers.  She was changed to the sometime back by nurse practitioner.  She is asking about going back to inhaler  particularly Stiolto.  She is willing to try Trelegy         OV 07/20/2018  Subjective:  Patient ID: Victoria [name], female , DOB: 20-Jul-1945 , age 70 y.o. , MRN: [id number] , ADDRESS: [address] [place] [id number]   07/20/2018 -   Chief Complaint  Patient presents with  . Follow-up    Pt states her breathing has become worse since last visit, has an occ dry cough, and also has some tightness in chest.   Follow-up) COPD with chronic hypoxemic respiratory failure.  On Trelegy, oxygen 2 L and Daliresp.  HPI Victoria [name] 73 y.o. -presents for routine follow-up.  Since last seeing me he saw a nurse practitioner for follow-up.  She tells me for the last 3 days she is has increased shortness of breath and increased chest tightness and wheezing but no increase in cough or sputum production.  This fits in with a slight increase in COPD CAT score to 18.  She takes 2 L of oxygen at home and 3 L with a portable system.  She continues on Daliresp without problems and also Trelegy inhaler.  Viral exposure or travel history -   Is negative.  Last CT scan of the chest was in June 2019 showed only emphysema.  This was for lung cancer screening.  Last pulmonary function test was in 2017.  Most recent blood work reviewed was in October 2019 and normal.   Also c/p bad knees and morning stiffness and arthralgia/arthritis for long tme. No dx opf RA     OV 01/17/2019  Subjective:  Patient ID: Victoria [name], female , DOB: 26-Nov-1945 , age 56 y.o. , MRN: [id number] , ADDRESS: [place] [id number]  Follow-up) COPD with chronic hypoxemic respiratory failure.  On Trelegy, oxygen 2 L and Daliresp.  01/17/2019 -  Copd followup   HPI Victoria [name] 73 y.o. -returns for follow-up of advanced COPD.  Overall she is stable.  She is doing good with social distancing.  She has not had any COPD exacerbations.  She is having some dryness of the nose with the oxygen for the last few  days and this is causing burning and irritation.  She tried some saline nasal spray yesterday and that seems to help.  She had a low-dose CT scan for lung cancer screening and her lung nodules are stable.  Repeat CT scan is recommended in 1 year.  She is compliant with her oxygen, Trelegy and Daliresp.  This combination is helping her.  COPD CAT score is 16.  She is having some economic difficulties because of the pandemic but otherwise is doing okay.    CAT COPD Symptom & Quality of  Life Score ([place]) 0 is no burden. 5 is highest burden 07/20/2017  09/14/2017  02/09/2018  07/20/2018  01/17/2019   Never Cough -> Cough all the time '2 3 2 2 2  ' No phlegm in chest -> Chest is full of phlegm '1 2 2 2 1  ' No chest tightness -> Chest feels very tight '2 3 2 2 1  ' No dyspnea for 1 flight stairs/hill -> Very dyspneic for 1 flight of stairs '4 5 4 3 3  ' No limitations for ADL at home -> Very limited with ADL at home '2 3 2 3 2  ' Confident leaving home -> Not at all confident leaving home 0 3 0 2 3  Sleep soundly -> Do not sleep soundly because of lung condition '2 2 1 1 2  ' Lots of Energy -> No energy at all '3 3 2 3 2  ' TOTAL Score (max 40)  '16 24 15 18 16     ' Results for KRYSTALYNN, RIDGEWAY (MRN [id number]) as of 07/20/2017 10:59  Ref. Range 04/15/2016 13:02  FEV1-Post Latest Units: L 0.65  FEV1-%Pred-Post Latest Units: % 34  FEV1-%Change-Post Latest Units: % 1   Results for KHADEEJAH, CASTNER (MRN [id number]) as of 07/20/2017 10:59  Ref. Range 04/15/2016 13:02  DLCO cor Latest Units: ml/min/mmHg 9.47  DLCO cor % pred Latest Units: % 37  IMPRESSION: 1. Lung-RADS 2, benign appearance or behavior. Continue annual screening with low-dose chest CT without contrast in 12 months. 2. Aortic atherosclerosis (ICD10-I70.0), coronary artery atherosclerosis and emphysema (ICD10-J43.9). 3. Similar left breast nodule of 8 mm. The patient had a negative left-sided diagnostic mammogram on 12/04/2018. 4. Similar left adrenal  adenomas.   Electronically Signed   By: Abigail Miyamoto M.D.   On: 01/10/2019 14:34  ROS - per HPI     has a past medical history of Abdominal aneurysm ([place]), Anemia, Arthritis, Breast cancer in female [facility]), COPD (chronic obstructive pulmonary disease) ([place]), Dyspnea, Headache, Heart murmur, Hyperlipidemia, Hypertension, Multiple thyroid nodules, Murmur, cardiac (1950), Osteoporosis, Pneumonia, Pre-diabetes, Requires continuous at home supplemental oxygen, Sebaceous cyst, Sleep apnea, and Varicella (as child).   reports that she quit smoking about 7 years ago. Her smoking use included cigarettes. She has a 50.00 pack-year smoking history. She has never used smokeless tobacco.  Past Surgical History:  Procedure Laterality Date  . APPENDECTOMY     2008  . BREAST LUMPECTOMY Left 08/31/2017  . BREAST LUMPECTOMY WITH RADIOACTIVE SEED LOCALIZATION Left 08/10/2017   Procedure: BREAST LUMPECTOMY WITH RADIOACTIVE SEED LOCALIZATION;  Surgeon: Rolm Bookbinder, MD;  Location: [place];  Service: General;  Laterality: Left;  . [place]  . COLONOSCOPY    . COLONOSCOPY WITH PROPOFOL N/A 04/16/2016   Procedure: COLONOSCOPY WITH PROPOFOL;  Surgeon: Carol Ada, MD;  Location: [facility];  Service: Endoscopy;  Laterality: N/A;  . CYST REMOVAL NECK Left 04/13/2016   Procedure: EXCISION OF SEBACEOUS CYST LEFT POSTERIOR NECK;  Surgeon: Armandina Gemma, MD;  Location: [place];  Service: General;  Laterality: Left;  . Excision of Pelvic Absess, Right Ovary     2008  . RE-EXCISION OF BREAST CANCER,SUPERIOR MARGINS Left 08/31/2017   Procedure: RE-EXCISION OF LEFT  BREAST MARGINS ERAS PATHWAY;  Surgeon: Rolm Bookbinder, MD;  Location: [place];  Service: General;  Laterality: Left;  . TUBAL LIGATION  1980    Allergies  Allergen Reactions  . Benicar [Olmesartan] Swelling    Swelling of face and arms   .  [name] [Valsartan] Swelling    Swelling of face and arms   . Hydrocodone-Acetaminophen  Nausea And Vomiting    Severe vomiting  . Lisinopril Cough  . Monosodium Glutamate Other (See Comments)    Facial swelling per pt  . Codeine Other (See Comments)    jittery  . Lead Acetate Rash  . Nickel Rash    Severe rash to infection: pt is allergic to all metals other than sterling silver or gold jewelry.     Immunization History  Administered Date(s) Administered  . Influenza Split 01/16/2011  . Influenza, High Dose Seasonal PF 01/13/2017, 02/09/2018  . Influenza,inj,Quad PF,6+ Mos 02/01/2013, 02/27/2014, 04/07/2015, 01/08/2016  . Pneumococcal Conjugate-13 02/01/2013  . Pneumococcal Polysaccharide-23 10/29/2014  . Td 12/06/2011    Family History  Problem Relation Age of Onset  . Heart disease Mother        MI - fatal  . Hypertension Mother   . Stroke Father 43       fatal  . Alzheimer's disease Father   . Alzheimer's disease Brother   . Hyperlipidemia Brother   . Hypertension Brother   . Diabetes Brother   . Hypertension Brother   . Hyperlipidemia Brother   . Breast cancer Paternal Grandmother      Current Outpatient Medications:  .  Accu-Chek FastClix Lancets MISC, 1 each by Does not apply route daily. Use to monitor glucose levels once per day; E11.9, Disp: 102 each, Rfl: 2 .  acetaminophen (TYLENOL) 500 MG tablet, Take 1,000 mg by mouth every 6 (six) hours as needed., Disp: , Rfl:  .  Albuterol Sulfate (PROAIR RESPICLICK) 505 (90 Base) MCG/ACT AEPB, Inhale 1-2 puffs into the lungs every 6 (six) hours as needed (for wheezing/shortness of breath)., Disp: 1 each, Rfl: 0 .  amLODipine (NORVASC) 10 MG tablet, Take 1 tablet by mouth once daily, Disp: 90 tablet, Rfl: 0 .  anastrozole (ARIMIDEX) 1 MG tablet, Take 1 tablet (1 mg total) by mouth daily., Disp: 90 tablet, Rfl: 4 .  atorvastatin (LIPITOR) 80 MG tablet, Take 1 tablet (80 mg total) by mouth daily at 6 PM., Disp: 90 tablet, Rfl: 3 .  Blood Glucose Monitoring Suppl (ACCU-CHEK AVIVA PLUS) w/Device KIT, 1 Device  by Does not apply route daily. Use to monitor glucose levels once per day; E11.9, Disp: 1 kit, Rfl: 0 .  chlorthalidone (HYGROTON) 25 MG tablet, Take 1 tablet (25 mg total) by mouth daily., Disp: 90 tablet, Rfl: 3 .  Cholecalciferol (VITAMIN D3) 2000 units TABS, Take 4,000 Units by mouth daily., Disp: , Rfl:  .  denosumab (PROLIA) 60 MG/ML SOLN injection, Inject 60 mg into the skin every 6 (six) months. Administer in upper arm, thigh, or abdomen, Disp: , Rfl:  .  Fluticasone-Umeclidin-Vilant (TRELEGY ELLIPTA) 100-62.5-25 MCG/INH AEPB, Inhale 62.5 mcg into the lungs daily., Disp: 90 each, Rfl: 3 .  furosemide (LASIX) 20 MG tablet, furosemide 20 mg tablet  TAKE 1 TABLET BY MOUTH ONCE DAILY, Disp: , Rfl:  .  glucose blood (ACCU-CHEK AVIVA) test strip, 1 each by Other route daily. Use to monitor glucose levels once per day; E11.9, Disp: 100 each, Rfl: 2 .  hydrochlorothiazide (HYDRODIURIL) 25 MG tablet, Take 1 tablet (25 mg total) by mouth daily., Disp: 90 tablet, Rfl: 3 .  OXYGEN, Inhale 2-3 L into the lungs continuous. When exerting self , Disp: , Rfl:  .  potassium chloride SA (K-DUR,KLOR-CON) 20 MEQ tablet, Take 1 tablet (20 mEq total) by mouth 2 (two)  times daily., Disp: 180 tablet, Rfl: 1 .  roflumilast (DALIRESP) 500 MCG TABS tablet, Take 1 tablet (500 mcg total) by mouth daily., Disp: 30 tablet, Rfl: 11 .  SitaGLIPtin-MetFORMIN HCl (JANUMET XR) 50-1000 MG TB24, Take 1 tablet by mouth daily., Disp: 30 tablet, Rfl: 11 .  sodium chloride (OCEAN) 0.65 % SOLN nasal spray, Place 1 spray into both nostrils as needed (dryness)., Disp: , Rfl:       Objective:   Vitals:   01/17/19 1126  BP: (!) 150/78  Pulse: 85  Temp: (!) 97.5 F (36.4 C)  SpO2: 97%  Weight: 214 lb 6.4 oz (97.3 kg)  Height: 5' 4.5" (1.638 m)    Estimated body mass index is 36.23 kg/m as calculated from the following:   Height as of this encounter: 5' 4.5" (1.638 m).   Weight as of this encounter: 214 lb 6.4 oz (97.3 kg).   '@WEIGHTCHANGE' @  [employer]   01/17/19 1126  Weight: 214 lb 6.4 oz (97.3 kg)     Physical Exam  General Appearance:    Alert, cooperative, no distress, appears stated age - yes , Deconditioned looking - yes , OBESE  - yes, Sitting on Wheelchair -  yes  Head:    Normocephalic, without obvious abnormality, atraumatic  Eyes:    PERRL, conjunctiva/corneas clear,  Ears:    Normal TM's and external ear canals, both ears  Nose:   Nares normal, septum midline, mucosa normal, no drainage    or sinus tenderness. OXYGEN ON  - yes . Patient is @ 2-3L [place]   Throat:   Lips, mucosa, and tongue normal; teeth and gums normal. Cyanosis on lips - no  Neck:   Supple, symmetrical, trachea midline, no adenopathy;    thyroid:  no enlargement/tenderness/nodules; no carotid   bruit or JVD  Back:     Symmetric, no curvature, ROM normal, no CVA tenderness  Lungs:     Distress - no , Wheeze no, Barrell Chest - no, Purse lip breathing - no, Crackles - no   Chest Wall:    No tenderness or deformity.    Heart:    Regular rate and rhythm, S1 and S2 normal, no rub   or gallop, Murmur - no  Breast Exam:    NOT DONE  Abdomen:     Soft, non-tender, bowel sounds active all four quadrants,    no masses, no organomegaly. Visceral obesity - yes  Genitalia:   NOT DONE  Rectal:   NOT DONE  Extremities:   Extremities - normal, Has Cane - no, Clubbing - no, Edema - no  Pulses:   2+ and symmetric all extremities  Skin:   Stigmata of Connective Tissue Disease - no  Lymph nodes:   Cervical, supraclavicular, and axillary nodes normal  Psychiatric:  Neurologic:   Pleasant - yes, Anxious - no, Flat affect - no  CAm-ICU - neg, Alert and Oriented x 3 - yes, Moves all 4s - yes, Speech - normal, Cognition - intact           Assessment:       ICD-10-CM   1. COPD, severe ([place])  J44.9   2. Chronic respiratory failure with hypoxia ([facility])  J96.11        Plan:     Patient Instructions     ICD-10-CM   1. COPD, severe  ([place])  J44.9   2. Chronic respiratory failure with hypoxia ([facility])  J96.11     Stable disease  Plan  - high dose flu shot 01/17/2019 - continue trelegy, o2 and daliresp  - daily saline nasal spray for nasal dryness stemming from oxygen - repeat low dose lung cancer screening CT in 1 year - can order at followup - Please talk to PCP Janith Lima, MD -  and ensure you get  shingrix ([place]) inactivated vaccine against shingles   Followup 6 months or sooner if needed - CAT score at followup     SIGNATURE    Dr. Brand Males, M.D., F.C.C.P,  Pulmonary and Critical Care Medicine Staff Physician, [place] Director - Interstitial Lung Disease  Program  Pulmonary [place] [facility] [place], [place], [address]  Pager: [phone], If no answer or between  15:00h - 7:00h: call [phone] Telephone: [phone]  11:50 AM 01/17/2019

## 2019-01-19 ENCOUNTER — Encounter: Payer: Self-pay | Admitting: Endocrinology

## 2019-01-19 ENCOUNTER — Other Ambulatory Visit: Payer: Self-pay

## 2019-01-19 ENCOUNTER — Ambulatory Visit (INDEPENDENT_AMBULATORY_CARE_PROVIDER_SITE_OTHER): Payer: Medicare Other | Admitting: Endocrinology

## 2019-01-19 DIAGNOSIS — E0590 Thyrotoxicosis, unspecified without thyrotoxic crisis or storm: Secondary | ICD-10-CM | POA: Diagnosis not present

## 2019-01-19 NOTE — Patient Instructions (Signed)
let's check a thyroid "scan" (a special, but easy and painless type of thyroid x ray).  It works like this: you go to the x-ray department of the hospital to swallow a pill, which contains a miniscule amount of radiation.  You will not notice any symptoms from this.  You will go back to the x-ray department the next day, to lie down in front of a camera.  The results of this will be sent to me.   Based on the results, i hope to order for you a treatment pill of radioactive iodine.  Although it is a larger amount of radiation, you will again notice no symptoms from this.  The pill is gone from your body in a few days (during which you should stay away from other people), but takes several months to work.  Therefore, please return here approximately 6-8 weeks after the treatment.  This treatment has been available for many years, and the only known side-effect is an underactive thyroid.  It is possible that i would eventually prescribe for you a thyroid hormone pill, which is very inexpensive.  You don't have to worry about side-effects of this thyroid hormone pill, because it is the same molecule your thyroid makes.  

## 2019-01-19 NOTE — Progress Notes (Signed)
Subjective:    Patient ID: Victoria [name], female    DOB: May 15, 1946, 73 y.o.   MRN: [id number]  HPI Pt returns for f/u of diabetes mellitus: DM type: 2 Dx'ed: 0258 Complications: CAD and PAD Therapy: Janumet GDM: never DKA: never Severe hypoglycemia: never Pancreatitis: never Pancreatic imaging: normal on 2008 CT Other: edema limits rx options Interval history: no recent steroids; fructosamine showed better glycemic control than A1c.  She says cbg's are well-controlled Pt also has hyperthyroidism (dx'ed 2019; US showed MNG; bx of RLL and RML nodules in 2019 showed beth cat 2).  No change in chronic doe.   Past Medical History:  Diagnosis Date  . Abdominal aneurysm ([place])    Dr. Oneida Alar follows lLOV 2 ''17 per pt "around 2 cm"  . Anemia    as a child  . Arthritis    left ankle, right knee, right SI joint, wrists, lower back  . Breast cancer in female [facility])    Left  . COPD (chronic obstructive pulmonary disease) ([place])    ephysema-Dr. Chase Caller  . Dyspnea   . Headache    as a child would have terrible headaches during season changes  . 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 Hyperlipidemia   . Hypertension   . Multiple thyroid nodules   . Murmur, cardiac 1950  . Osteoporosis   . Pneumonia   . Pre-diabetes   . Requires continuous at home supplemental oxygen    2 L 24/7  . Sebaceous cyst    hairline sebaceous cyst left posterior neck to be excised 04-13-16 by Dr. Harlow Asa in [facility]  . Sleep apnea    cpap used sometimes, uses oxygen concentrator 2 l/m nasally bedtime  . Varicella as child    Past Surgical History:  Procedure Laterality Date  . APPENDECTOMY     2008  . BREAST LUMPECTOMY Left 08/31/2017  . BREAST LUMPECTOMY WITH RADIOACTIVE SEED LOCALIZATION Left 08/10/2017   Procedure: BREAST LUMPECTOMY WITH RADIOACTIVE SEED LOCALIZATION;  Surgeon: Rolm Bookbinder, MD;  Location: [facility];  Service: General;  Laterality: Left;  . [place]  . COLONOSCOPY    . COLONOSCOPY WITH PROPOFOL N/A 04/16/2016   Procedure: COLONOSCOPY WITH PROPOFOL;  Surgeon: Carol Ada, MD;  Location: [facility];  Service: Endoscopy;  Laterality: N/A;  . CYST REMOVAL NECK Left 04/13/2016   Procedure: EXCISION OF SEBACEOUS CYST LEFT POSTERIOR NECK;  Surgeon: Armandina Gemma, MD;  Location: [facility];  Service: General;  Laterality: Left;  . Excision of Pelvic Absess, Right Ovary     2008  . RE-EXCISION OF BREAST CANCER,SUPERIOR MARGINS Left 08/31/2017   Procedure: RE-EXCISION OF LEFT  BREAST MARGINS ERAS PATHWAY;  Surgeon: Rolm Bookbinder, MD;  Location: [facility];  Service: General;  Laterality: Left;  . TUBAL LIGATION  1980    Social History   Socioeconomic History  . Marital status: Divorced    Spouse name: Not on file  . Number of children: 1  . Years of education: 35  . Highest education level: Not on file  Occupational History  . Occupation: Social worker, Technical sales engineer: [employer]  Social Needs  . Financial resource strain: Not very hard  . Food insecurity    Worry: Never true    Inability: Never true  . Transportation needs    Medical: No    Non-medical: No  Tobacco Use  . Smoking status: Former Smoker  Packs/day: 1.00    Years: 50.00    Pack years: 50.00    Types: Cigarettes    Quit date: 04/16/2011    Years since quitting: 7.7  . Smokeless tobacco: Never Used  . Tobacco comment: quit that date when she had to go to ER   Substance and Sexual Activity  . Alcohol use: Yes    Alcohol/week: 0.0 standard drinks    Comment: rare occasion  . Drug use: No    Comment: no marijuana since 2012  . Sexual activity: Not Currently  Lifestyle  . Physical activity    Days per week: 0 days    Minutes per session: 0 min  . Stress: Only a little  Relationships  . Social connections    Talks on phone: More than three times a week    Gets together: More than three times a week    Attends religious service: More than 4  times per year    Active member of club or organization: Yes    Attends meetings of clubs or organizations: More than 4 times per year    Relationship status: Divorced  . Intimate partner violence    Fear of current or ex partner: Not on file    Emotionally abused: Not on file    Physically abused: Not on file    Forced sexual activity: Not on file  Other Topics Concern  . Not on file  Social History Narrative   HSG; [name], [place]. . Married '69 - 9 yrs/divorced. 1 son - '72; no grandchildren.   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 No h/o physical or sexual abuse. ACP - no living will - wants information. Provided packet of information. On 08/05/2011: she stated she was distant cousins to celebrities [name] and [name]       Current Outpatient Medications on File Prior to Visit  Medication Sig Dispense Refill  . Accu-Chek FastClix Lancets MISC 1 each by Does not apply route daily. Use to monitor glucose levels once per day; E11.9 102 each 2  . acetaminophen (TYLENOL) 500 MG tablet Take 1,000 mg by mouth every 6 (six) hours as needed.    . Albuterol Sulfate (PROAIR RESPICLICK) 409 (90 Base) MCG/ACT AEPB Inhale 1-2 puffs into the lungs every 6 (six) hours as needed (for wheezing/shortness of breath). 1 each 0  . amLODipine (NORVASC) 10 MG tablet Take 1 tablet by mouth once daily 90 tablet 0  . anastrozole (ARIMIDEX) 1 MG tablet Take 1 tablet (1 mg total) by mouth daily. 90 tablet 4  . atorvastatin (LIPITOR) 80 MG tablet Take 1 tablet (80 mg total) by mouth daily at 6 PM. 90 tablet 3  . Blood Glucose Monitoring Suppl (ACCU-CHEK AVIVA PLUS) w/Device KIT 1 Device by Does not apply route daily. Use to monitor glucose levels once per day; E11.9 1 kit 0  . chlorthalidone (HYGROTON) 25 MG tablet Take 1 tablet (25 mg total) by mouth daily. 90 tablet 3  . Cholecalciferol (VITAMIN D3) 2000 units TABS Take 4,000 Units by mouth  daily.    [name] denosumab (PROLIA) 60 MG/ML SOLN injection Inject 60 mg into the skin every 6 (six) months. Administer in upper arm, thigh, or abdomen    . Fluticasone-Umeclidin-Vilant (TRELEGY ELLIPTA) 100-62.5-25 MCG/INH AEPB Inhale 62.5 mcg into the lungs daily. 90 each 3  . furosemide (LASIX) 20 MG tablet furosemide 20 mg tablet  TAKE 1 TABLET BY MOUTH ONCE DAILY    .  glucose blood (ACCU-CHEK AVIVA) test strip 1 each by Other route daily. Use to monitor glucose levels once per day; E11.9 100 each 2  . hydrochlorothiazide (HYDRODIURIL) 25 MG tablet Take 1 tablet (25 mg total) by mouth daily. 90 tablet 3  . OXYGEN Inhale 2-3 L into the lungs continuous. When exerting self     . potassium chloride SA (K-DUR,KLOR-CON) 20 MEQ tablet Take 1 tablet (20 mEq total) by mouth 2 (two) times daily. 180 tablet 1  . roflumilast (DALIRESP) 500 MCG TABS tablet Take 1 tablet (500 mcg total) by mouth daily. 30 tablet 11  . SitaGLIPtin-MetFORMIN HCl (JANUMET XR) 50-1000 MG TB24 Take 1 tablet by mouth daily. 30 tablet 11  . sodium chloride (OCEAN) 0.65 % SOLN nasal spray Place 1 spray into both nostrils as needed (dryness).     No current facility-administered medications on file prior to visit.     Allergies  Allergen Reactions  . Benicar [Olmesartan] Swelling    Swelling of face and arms   . [name] [Valsartan] Swelling    Swelling of face and arms   . Hydrocodone-Acetaminophen Nausea And Vomiting    Severe vomiting  . Lisinopril Cough  . Monosodium Glutamate Other (See Comments)    Facial swelling per pt  . Codeine Other (See Comments)    jittery  . Lead Acetate Rash  . Nickel Rash    Severe rash to infection: pt is allergic to all metals other than sterling silver or gold jewelry.     Family History  Problem Relation Age of Onset  . Heart disease Mother        MI - fatal  . Hypertension Mother   . Stroke Father 27       fatal  . Alzheimer's disease Father   . Alzheimer's disease Brother   .  Hyperlipidemia Brother   . Hypertension Brother   . Diabetes Brother   . Hypertension Brother   . Hyperlipidemia Brother   . Breast cancer Paternal Grandmother     BP (!) 128/58 (BP Location: Left Arm, Patient Position: Sitting, Cuff Size: Large)   Pulse 97   Ht 5' 4.5" (1.638 m)   Wt 213 lb 9.6 oz (96.9 kg)   SpO2 97%   BMI 36.10 kg/m    Review of Systems Denies palpitations and tremor    Objective:   Physical Exam VITAL SIGNS:  See vs page GENERAL: no distress.  Has 02 on.   NECK: Thyroid is 3-5 times normal size, with multiple palpable nodules  No thyroid nodule is palpable.  No palpable lymphadenopathy at the anterior neck.     Lab Results  Component Value Date   TSH 0.14 (L) 01/09/2019   T3TOTAL 94.0 10/14/2015   T4TOTAL 8.2 01/13/2017      Assessment & Plan:  Type 2 DM: well-controlled MNG: clinically stable Hyperthyroidism: due to the goiter. We discussed rx options.  She chooses radioactive iodine treatment.  Patient Instructions  let's check a thyroid "scan" (a special, but easy and painless type of thyroid x ray).  It works like this: you go to the x-ray department of the hospital to swallow a pill, which contains a miniscule amount of radiation.  You will not notice any symptoms from this.  You will go back to the x-ray department the next day, to lie down in front of a camera.  The results of this will be sent to me.   Based on the results, i hope to order  for you a treatment pill of radioactive iodine.  Although it is a larger amount of radiation, you will again notice no symptoms from this.  The pill is gone from your body in a few days (during which you should stay away from other people), but takes several months to work.  Therefore, please return here approximately 6-8 weeks after the treatment.  This treatment has been available for many years, and the only known side-effect is an underactive thyroid.  It is possible that i would eventually prescribe for you  a thyroid hormone pill, which is very inexpensive.  You don't have to worry about side-effects of this thyroid hormone pill, because it is the same molecule your thyroid makes.

## 2019-01-21 ENCOUNTER — Other Ambulatory Visit: Payer: Self-pay | Admitting: Internal Medicine

## 2019-01-21 DIAGNOSIS — I10 Essential (primary) hypertension: Secondary | ICD-10-CM

## 2019-01-29 ENCOUNTER — Other Ambulatory Visit: Payer: Self-pay

## 2019-01-29 ENCOUNTER — Encounter: Payer: Self-pay | Admitting: General Practice

## 2019-01-29 ENCOUNTER — Ambulatory Visit (INDEPENDENT_AMBULATORY_CARE_PROVIDER_SITE_OTHER): Payer: Medicare Other | Admitting: General Practice

## 2019-01-29 VITALS — BP 130/86 | HR 108 | Temp 98.2°F | Ht 64.5 in | Wt 212.2 lb

## 2019-01-29 DIAGNOSIS — E785 Hyperlipidemia, unspecified: Secondary | ICD-10-CM | POA: Diagnosis not present

## 2019-01-29 DIAGNOSIS — I10 Essential (primary) hypertension: Secondary | ICD-10-CM

## 2019-01-29 DIAGNOSIS — E049 Nontoxic goiter, unspecified: Secondary | ICD-10-CM

## 2019-01-29 DIAGNOSIS — I2510 Atherosclerotic heart disease of native coronary artery without angina pectoris: Secondary | ICD-10-CM | POA: Diagnosis not present

## 2019-01-29 DIAGNOSIS — J449 Chronic obstructive pulmonary disease, unspecified: Secondary | ICD-10-CM

## 2019-01-29 DIAGNOSIS — I700 Atherosclerosis of aorta: Secondary | ICD-10-CM | POA: Diagnosis not present

## 2019-01-29 NOTE — Patient Instructions (Signed)
Medication Instructions:  Your physician recommends that you continue on your current medications as directed. Please refer to the Current Medication list given to you today.  If you need a refill on your cardiac medications before your next appointment, please call your pharmacy.   Lab work: NONE ordered at this time of appointment   If you have labs (blood work) drawn today and your tests are completely normal, you will receive your results only by: [name] MyChart Message (if you have MyChart) OR . A paper copy in the mail If you have any lab test that is abnormal or we need to change your treatment, we will call you to review the results.  Testing/Procedures: NONE ordered at this time of appointment   Follow-Up: At [facility], you and your health needs are our priority.  As part of our continuing mission to provide you with exceptional heart care, we have created designated Provider Care Teams.  These Care Teams include your primary Cardiologist (physician) and Advanced Practice Providers (APPs -  Physician Assistants and Nurse Practitioners) who all work together to provide you with the care you need, when you need it. . You will need a follow up appointment in 6 months-March 2021.  Please call our office in January 2021 to schedule this appointment.  You may see Victoria Majestic, MD  Any Other Special Instructions Will Be Listed Below (If Applicable).

## 2019-01-29 NOTE — Progress Notes (Signed)
Cardiology Clinic Note   Patient Name: Victoria [name] Date of Encounter: 01/29/2019  Primary Care Provider:  Janith Lima, [name] Primary Cardiologist:  Victoria Majestic, [name]  Patient Profile  Victoria [name] 73 year old female presents to the clinic today for follow-up of her coronary artery disease.   Past Medical History    Past Medical History:  Diagnosis Date  . Abdominal aneurysm ([place])    Dr. Oneida [name] follows lLOV 2 ''17 per pt "around 2 cm"  . Anemia    as a child  . Arthritis    left ankle, right knee, right SI joint, wrists, lower back  . Breast cancer in female [facility])    Left  . COPD (chronic obstructive pulmonary disease) ([place])    ephysema-Victoria [name]  . Dyspnea   . Headache    as a child would have terrible headaches during season changes  . 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 Hyperlipidemia   . Hypertension   . Multiple thyroid nodules   . Murmur, cardiac 1950  . Osteoporosis   . Pneumonia   . Pre-diabetes   . Requires continuous at home supplemental oxygen    2 L 24/7  . Sebaceous cyst    hairline sebaceous cyst left posterior neck to be excised 04-13-16 by Dr. [name]  . Sleep apnea    cpap used sometimes, uses oxygen concentrator 2 l/m nasally bedtime  . Varicella as child   Past Surgical History:  Procedure Laterality Date  . APPENDECTOMY     2008  . BREAST LUMPECTOMY Left 08/31/2017  . BREAST LUMPECTOMY WITH RADIOACTIVE SEED LOCALIZATION Left 08/10/2017   Procedure: BREAST LUMPECTOMY WITH RADIOACTIVE SEED LOCALIZATION;  Surgeon: Victoria Bookbinder, [name];  Location: [name];  Service: General;  Laterality: Left;  . [place]  . COLONOSCOPY    . COLONOSCOPY WITH PROPOFOL N/A 04/16/2016   Procedure: COLONOSCOPY WITH PROPOFOL;  Surgeon: Victoria Ada, [name];  Location: [facility];  Service: Endoscopy;  Laterality: N/A;  . CYST REMOVAL NECK Left 04/13/2016   Procedure: EXCISION OF SEBACEOUS CYST LEFT  POSTERIOR NECK;  Surgeon: Victoria Gemma, [name];  Location: [facility] [name];  Service: General;  Laterality: Left;  . Excision of Pelvic Absess, Right Ovary     2008  . RE-EXCISION OF BREAST CANCER,SUPERIOR MARGINS Left 08/31/2017   Procedure: RE-EXCISION OF LEFT  BREAST MARGINS ERAS PATHWAY;  Surgeon: Victoria Bookbinder, [name];  Location: [name];  Service: General;  Laterality: Left;  . TUBAL LIGATION  1980    Allergies  Allergies  Allergen Reactions  . Benicar [Olmesartan] Swelling    Swelling of face and arms   . [name] [Valsartan] Swelling    Swelling of face and arms   . Hydrocodone-Acetaminophen Nausea And Vomiting    Severe vomiting  . Lisinopril Cough  . Monosodium Glutamate Other (See Comments)    Facial swelling per pt  . Codeine Other (See Comments)    jittery  . Lead Acetate Rash  . Nickel Rash    Severe rash to infection: pt is allergic to all metals other than sterling silver or gold jewelry.     History of Present Illness    [name] was last seen via virtual platform by Victoria [name] on 11/02/2018.  She had been referred by Dr. Jana [name] following a chest CT that suggested multivessel coronary artery disease.    She has a longstanding history of smoking for  50 years (stopped in 2012), severe COPD, obesity, and sleep apnea on CPAP followed by Victoria [name].  She has a history of breast cancer and is status post lumpectomy.  She underwent her chest CT for lung screening evaluation that did not show evidence of lung cancer.  However, the CT suggested aortic and coronary atherosclerosis including calcified plaque in the left main, LAD, circumflex, and RCA.  Her recent CT coronary angios 11/27/2018 showed no significant stenosis via FFR.  In her echocardiogram on 11/10/2018 showed hyperdynamic systolic function with an EF greater than 65%, moderate LVH, impaired relaxation, LVEDP greater than 15 mmHg, increase in right ventricular wall thickness, mild thickening of the mitral valve with mild annular  calcification and mild calcification of the aortic valve.  She presents the clinic today and states she has been working with Victoria [name] and her breathing feels like it has improved.  She continues to wear her CPAP and she is trying to be more physically active.  She also wants to know if it is safe for her to fly to her relatives funeral.  She also states that she has hyperthyroidism and is working with an endocrinologist receiving radioactive iodine as treatment.  She denies chest pain, increased shortness of breath, lower extremity edema, fatigue, palpitations, melena, hematuria, hemoptysis, diaphoresis, weakness, presyncope, syncope, orthopnea, and PND.  Home Medications    Prior to Admission medications   Medication Sig Start Date End Date Taking? Authorizing Provider  Accu-Chek FastClix Lancets MISC 1 each by Does not apply route daily. Use to monitor glucose levels once per day; E11.9 12/14/18   Victoria Shin, [name]  acetaminophen (TYLENOL) 500 MG tablet Take 1,000 mg by mouth every 6 (six) hours as needed.    Provider, Historical, [name]  Albuterol Sulfate (PROAIR RESPICLICK) 161 (90 Base) MCG/ACT AEPB Inhale 1-2 puffs into the lungs every 6 (six) hours as needed (for wheezing/shortness of breath). 05/02/18   [name], Victoria Mu, NP  amLODipine (NORVASC) 10 MG tablet Take 1 tablet by mouth once daily 01/21/19   Victoria Lima, [name]  anastrozole (ARIMIDEX) 1 MG tablet Take 1 tablet (1 mg total) by mouth daily. 08/16/18   Victoria [name]  atorvastatin (LIPITOR) 80 MG tablet Take 1 tablet (80 mg total) by mouth daily at 6 PM. 11/03/18   Victoria Sine, [name]  Blood Glucose Monitoring Suppl (ACCU-CHEK AVIVA PLUS) w/Device KIT 1 Device by Does not apply route daily. Use to monitor glucose levels once per day; E11.9 12/14/18   Victoria Shin, [name]  chlorthalidone (HYGROTON) 25 MG tablet Take 1 tablet (25 mg total) by mouth daily. 11/03/18 02/01/19  Victoria Sine, [name]  Cholecalciferol (VITAMIN D3) 2000 units  TABS Take 4,000 Units by mouth daily.    Provider, Historical, [name]  denosumab (PROLIA) 60 MG/ML SOLN injection Inject 60 mg into the skin every 6 (six) months. Administer in upper arm, thigh, or abdomen    Provider, Historical, [name]  Fluticasone-Umeclidin-Vilant (TRELEGY ELLIPTA) 100-62.5-25 MCG/INH AEPB Inhale 62.5 mcg into the lungs daily. 09/05/18   Brand Males, [name]  furosemide (LASIX) 20 MG tablet furosemide 20 mg tablet  TAKE 1 TABLET BY MOUTH ONCE DAILY    Provider, Historical, [name]  glucose blood (ACCU-CHEK AVIVA) test strip 1 each by Other route daily. Use to monitor glucose levels once per day; E11.9 12/14/18   Victoria Shin, [name]  hydrochlorothiazide (HYDRODIURIL) 25 MG tablet Take 1 tablet (25 mg total) by mouth daily. 02/22/17   Victoria Lima,  [name]  OXYGEN Inhale 2-3 L into the lungs continuous. When exerting self     Provider, Historical, [name]  potassium chloride SA (K-DUR,KLOR-CON) 20 MEQ tablet Take 1 tablet (20 mEq total) by mouth 2 (two) times daily. 06/12/18   Victoria Lima, [name]  roflumilast (DALIRESP) 500 MCG TABS tablet Take 1 tablet (500 mcg total) by mouth daily. 07/21/18   Brand Males, [name]  SitaGLIPtin-MetFORMIN HCl (JANUMET XR) 50-1000 MG TB24 Take 1 tablet by mouth daily. 12/13/18   Victoria Shin, [name]  sodium chloride (OCEAN) 0.65 % SOLN nasal spray Place 1 spray into both nostrils as needed (dryness).    Provider, Historical, [name]    Family History    Family History  Problem Relation Age of Onset  . Heart disease Mother        MI - fatal  . Hypertension Mother   . Stroke Father 76       fatal  . Alzheimer's disease Father   . Alzheimer's disease Brother   . Hyperlipidemia Brother   . Hypertension Brother   . Diabetes Brother   . Hypertension Brother   . Hyperlipidemia Brother   . Breast cancer Paternal Grandmother    She indicated that her mother is deceased. She indicated that her father is deceased. She indicated that all of her three brothers are alive. She  indicated that the status of her paternal grandmother is unknown.  Social History    Social History   Socioeconomic History  . Marital status: Divorced    Spouse name: Not on file  . Number of children: 1  . Years of education: 39  . Highest education level: Not on file  Occupational History  . Occupation: Social worker, Technical sales engineer: [employer]  Social Needs  . Financial resource strain: Not very hard  . Food insecurity    Worry: Never true    Inability: Never true  . Transportation needs    Medical: No    Non-medical: No  Tobacco Use  . Smoking status: Former Smoker    Packs/day: 1.00    Years: 50.00    Pack years: 50.00    Types: Cigarettes    Quit date: 04/16/2011    Years since quitting: 7.7  . Smokeless tobacco: Never Used  . Tobacco comment: quit that date when she had to go to ER   Substance and Sexual Activity  . Alcohol use: Yes    Alcohol/week: 0.0 standard drinks    Comment: rare occasion  . Drug use: No    Comment: no marijuana since 2012  . Sexual activity: Not Currently  Lifestyle  . Physical activity    Days per week: 0 days    Minutes per session: 0 min  . Stress: Only a little  Relationships  . Social connections    Talks on phone: More than three times a week    Gets together: More than three times a week    Attends religious service: More than 4 times per year    Active member of club or organization: Yes    Attends meetings of clubs or organizations: More than 4 times per year    Relationship status: Divorced  . Intimate partner violence    Fear of current or ex partner: Not on file    Emotionally abused: Not on file    Physically abused: Not on file    Forced sexual activity: Not on file  Other Topics Concern  . Not on file  Social History Narrative   HSG; [name], [employer]. . Married '69 - 9 yrs/divorced. 1 son - '72; no grandchildren.   Work - developmentally disabled, Tax adviser.  Lives alone. No h/o physical or sexual abuse. ACP - no living will - wants information. Provided packet of information. On 08/05/2011: she stated she was distant cousins to celebrities [name] and [name]        Review of Systems    General:  No chills, fever, night sweats or weight changes.  Cardiovascular:  No chest pain, dyspnea on exertion, edema, orthopnea, palpitations, paroxysmal nocturnal dyspnea. Dermatological: No rash, lesions/masses Respiratory: No cough, dyspnea Urologic: No hematuria, dysuria Abdominal:   No nausea, vomiting, diarrhea, bright red blood per rectum, melena, or hematemesis Neurologic:  No visual changes, wkns, changes in mental status. All other systems reviewed and are otherwise negative except as noted above.  Physical Exam    VS:  BP 130/86   Pulse (!) 108   Temp 98.2 F (36.8 C) (Temporal)   Ht 5' 4.5" (1.638 m)   Wt 212 lb 3.2 oz (96.3 kg)   SpO2 97%   BMI 35.86 kg/m  , BMI Body mass index is 35.86 kg/m. GEN: Well nourished, well developed, in no acute distress. HEENT: normal. Neck: Supple, no JVD, carotid bruits, or masses. Cardiac: RRR, no murmurs, rubs, or gallops. No clubbing, cyanosis, edema.  Radials/DP/PT 2+ and equal bilaterally.  Respiratory:  Respirations regular and unlabored, clear to auscultation bilaterally. GI: Soft, nontender, nondistended, BS + x 4. MS: no deformity or atrophy. Skin: warm and dry, no rash. Neuro:  Strength and sensation are intact. Psych: Normal affect.  Accessory Clinical Findings    ECG personally reviewed by me today-sinus tachycardia septal infarct age undetermined 108 bpm- No acute changes  EKG 02/13/2018 Normal sinus rhythm septal infarct age undetermined 84 bpm  Echocardiogram 11/10/2018 IMPRESSIONS    1. The left ventricle has hyperdynamic systolic function, with an ejection fraction of >65%. The cavity size was normal. There is moderately increased left ventricular wall thickness.  Left ventricular diastolic Doppler parameters are consistent with  impaired relaxation. Elevated left ventricular end-diastolic pressure The E/e' is >15. No evidence of left ventricular regional wall motion abnormalities.  2. The right ventricle has normal systolic function. The cavity was normal. There is no increase in right ventricular wall thickness.  3. The mitral valve is abnormal. Mild thickening of the mitral valve leaflet. There is mild mitral annular calcification present.  4. The tricuspid valve is grossly normal.  5. The aortic valve was not well visualized. Mild calcification of the aortic valve. No stenosis of the aortic valve.  CT coronary FFR  FINDINGS: FFRct analysis was performed on the original cardiac CT angiogram dataset. Diagrammatic representation of the FFRct analysis is provided in a separate PDF document in PACS. This dictation was created using the PDF document and an interactive 3D model of the results. 3D model is not available in the EMR/PACS. Normal FFR range is >0.80. 1. Left Main:  No significant stenosis.  FFR = 0.98.  2. LAD: No significant stenosis. Proximal FFR = 0.96, Mid FFR = 0.96, Distal FFR = 0.81. Diagonal: No significant stenosis. Proximal diagonal = 0.95, mid diagonal 0.92, distal diagonal = 0.85. 3. LCX: No significant stenosis. Proximal FFR = 0.97, Distal FFR = 0.91. 4. Ramus: No significant stenosis. Proximal FFR = 0.99, Distal FFR = 0.94. 5. RCA: No significant stenosis. Proximal FFR = 0.99, Mid FFR =  0.95, PDA FFR = 0.91.  Assessment & Plan   1.  Atherosclerosis of coronary arteries and aorta- no chest pain.  CT coronary FFR showing only FFR range greater than 0.80 for all coronary arteries.  No further testing recommended Continue atorvastatin 80 mg tablet daily Continue chlorthalidone 25 mg tablet daily Continue amlodipine 10 mg tablet daily Continue heart healthy low-sodium diet Increase physical activity as tolerated  2.   Essential hypertension BP today 130/68.  Well managed at home. Continue chlorthalidone 25 mg tablet daily Continue amlodipine 10 mg tablet daily Continue heart healthy low-sodium diet Increase physical activity as tolerated  3.  Hyperlipidemia-01/08/2019: Cholesterol, Total 149; HDL 53; LDL Calculated 66; Triglycerides 148 Continue atorvastatin 80 mg tablet daily. Heart healthy low-sodium high-fiber diet Increase physical activity as tolerated  4.  COPD- no increased shortness of breath today followed by Victoria [name]  5.  Multinodular goiter- followed by Dr. Harlow [name].  Receiving radioactive iodine treatment  Disposition: Follow-up with Victoria [name] in 6 months  Deberah Pelton, NP-C 01/29/2019, 5:38 PM

## 2019-02-04 ENCOUNTER — Other Ambulatory Visit: Payer: Self-pay | Admitting: Internal Medicine

## 2019-02-12 DIAGNOSIS — J449 Chronic obstructive pulmonary disease, unspecified: Secondary | ICD-10-CM | POA: Diagnosis not present

## 2019-02-16 DIAGNOSIS — G4733 Obstructive sleep apnea (adult) (pediatric): Secondary | ICD-10-CM | POA: Diagnosis not present

## 2019-02-21 ENCOUNTER — Telehealth: Payer: Self-pay | Admitting: Endocrinology

## 2019-02-21 DIAGNOSIS — G4733 Obstructive sleep apnea (adult) (pediatric): Secondary | ICD-10-CM | POA: Diagnosis not present

## 2019-02-21 NOTE — Telephone Encounter (Signed)
Called pt and provided her with the phone # for [facility]. Advised she call to schedule the appt. Routing this message to referral dept to ensure pre-cert/PA is not required.

## 2019-02-21 NOTE — Telephone Encounter (Signed)
Patient is calling for an update on the procedure Dr.Ellison was wantingher  to do with a radioactive pill.  Please Advise, Thanks

## 2019-03-01 ENCOUNTER — Encounter (HOSPITAL_COMMUNITY)
Admission: RE | Admit: 2019-03-01 | Discharge: 2019-03-01 | Disposition: A | Discharge status: Home / Self Care | Payer: Medicare Other | Source: Ambulatory Visit | Attending: Endocrinology | Admitting: Endocrinology

## 2019-03-01 ENCOUNTER — Other Ambulatory Visit: Payer: Self-pay

## 2019-03-01 DIAGNOSIS — E0590 Thyrotoxicosis, unspecified without thyrotoxic crisis or storm: Secondary | ICD-10-CM

## 2019-03-01 MED ORDER — SODIUM IODIDE I-123 7.4 MBQ CAPS
425.0000 | ORAL_CAPSULE | Freq: Once | ORAL | Status: AC
  Administered 2019-03-01: 425 via ORAL

## 2019-03-02 ENCOUNTER — Encounter (HOSPITAL_COMMUNITY)
Admission: RE | Admit: 2019-03-02 | Discharge: 2019-03-02 | Disposition: A | Discharge status: Home / Self Care | Payer: Medicare Other | Source: Ambulatory Visit | Attending: Endocrinology | Admitting: Endocrinology

## 2019-03-02 ENCOUNTER — Other Ambulatory Visit: Payer: Self-pay | Admitting: Endocrinology

## 2019-03-02 DIAGNOSIS — E0590 Thyrotoxicosis, unspecified without thyrotoxic crisis or storm: Secondary | ICD-10-CM

## 2019-03-05 ENCOUNTER — Telehealth: Payer: Self-pay | Admitting: Endocrinology

## 2019-03-05 NOTE — Telephone Encounter (Signed)
Called pt and informed of Dr. Ellison's response. Verbalized acceptance and understanding. 

## 2019-03-05 NOTE — Telephone Encounter (Signed)
Patient requests to be called at ph# [phone] re: Patient took capsule for scan/test. Since taking capsule patient has been to the bathroom (stools) very frequently. Patient wants to know if she is experiencing side effects from the medication. Please call patient at the ph# listed above to advise.

## 2019-03-05 NOTE — Telephone Encounter (Signed)
No, this is a coincidence.  I still hope you feel better soon.

## 2019-03-05 NOTE — Telephone Encounter (Signed)
Please advise 

## 2019-03-07 ENCOUNTER — Telehealth: Payer: Self-pay

## 2019-03-07 ENCOUNTER — Telehealth: Payer: Self-pay | Admitting: Internal Medicine

## 2019-03-07 NOTE — Telephone Encounter (Signed)
I am fine with her getting Covid testing done at her church. Sounds like it is just screening for the community. If she had active symptoms I would recommend getting the test done through [facility] system.

## 2019-03-07 NOTE — Telephone Encounter (Signed)
Called and spoke with patient. Patient states her [facility], [place] [facility] [address], is performing coronia virus tests with the health department. She reports that the church started these tests last Thursday and are continuing them from Monday-Friday 9am-3pm. She states she is skeptical about getting tested because Dr. Chase Caller told her back in March (when she was treated for an asthma exacerbation) that there was no need to.  She would like recommendations on whether it is ok for her to get the covid test done at her church. I let her know MR is not in the office currently, but that I would get this message to our provider on call. Pt expressed understanding.   Routing to [place] for f/u. Beth, please advise with your recommendations for this pt. Thank you.

## 2019-03-07 NOTE — Telephone Encounter (Signed)
Patient called in wanting lab results    Please advise

## 2019-03-07 NOTE — Telephone Encounter (Signed)
Called and spoke w/ pt regarding Beth's recommendations below. Pt verbalized understanding and states she is not having active symptoms. I reinforced that it would be fine for her to receive the test from her church per [place], which pt expressed understanding to. Nothing further needed at this time.

## 2019-03-07 NOTE — Telephone Encounter (Signed)
Called pt and reminded her of our conversation that BOTH the results and the letter regarding the RAI tx't have been mailed to her home address. As this did not go out until 03/05/19 via courier, reminded she is likely not to receive it until 03/09/19. Verbalized acceptance and understanding.

## 2019-03-09 NOTE — Telephone Encounter (Signed)
Spoke with the patient today to let her know I got her RAI therapy scheduled-patient can not do the date that I scheduled for her so she will be calling to re-schedule-patient did have some questions and concerns about this procedure and would like to have a call back from the nurse to explain these questions:  How is this going to affect my metabolism? Patien has lost 17 lbs since last visit does this mean anything? Once she has the procedure will it mean her thyroid will slow down and make it hard to lose weight and easy to gain? Please advise on these as soon as possible so that patient can re-schedule her appointment with [facility]

## 2019-03-12 NOTE — Telephone Encounter (Signed)
Patient called back in stating she has more concern with how she will feel and was wanting get some clarification about procedure    Please call and advise

## 2019-03-12 NOTE — Telephone Encounter (Signed)
Her concerns and anxiety has been almost daily. Could we schedule a VV to address ALL concerns?

## 2019-03-12 NOTE — Telephone Encounter (Signed)
Patient has been scheduled for 03/16/19 at 9:45 am

## 2019-03-12 NOTE — Telephone Encounter (Signed)
Yes, vv is good

## 2019-03-12 NOTE — Telephone Encounter (Signed)
Per Dr. Cordelia Pen request, please call pt to schedule VV. He will address ALL questions and concerns during appt

## 2019-03-12 NOTE — Telephone Encounter (Signed)
Please answer questions below and I will be happy to call her back with your responses

## 2019-03-14 DIAGNOSIS — J449 Chronic obstructive pulmonary disease, unspecified: Secondary | ICD-10-CM | POA: Diagnosis not present

## 2019-03-15 ENCOUNTER — Ambulatory Visit (HOSPITAL_COMMUNITY): Admission: RE | Admit: 2019-03-15 | Payer: Medicare Other | Source: Ambulatory Visit

## 2019-03-16 ENCOUNTER — Ambulatory Visit (INDEPENDENT_AMBULATORY_CARE_PROVIDER_SITE_OTHER): Payer: Medicare Other | Admitting: Endocrinology

## 2019-03-16 ENCOUNTER — Other Ambulatory Visit: Payer: Self-pay

## 2019-03-16 ENCOUNTER — Encounter: Payer: Self-pay | Admitting: Endocrinology

## 2019-03-16 DIAGNOSIS — E0590 Thyrotoxicosis, unspecified without thyrotoxic crisis or storm: Secondary | ICD-10-CM

## 2019-03-16 DIAGNOSIS — E118 Type 2 diabetes mellitus with unspecified complications: Secondary | ICD-10-CM | POA: Diagnosis not present

## 2019-03-16 NOTE — Progress Notes (Signed)
Subjective:    Patient ID: Victoria [name], female    DOB: 02/21/1946, 73 y.o.   MRN: [id number]  HPI  telehealth visit today via phone x 8 minutes.  Alternatives to telehealth are presented to this patient, and the patient agrees to the telehealth visit. Pt is advised of the cost of the visit, and agrees to this, also.   Patient is at home, and I am at the office.   Persons attending the telehealth visit: the patient and I.   Pt returns for f/u of diabetes mellitus: DM type: 2 Dx'ed: 8280 Complications: CAD and PAD Therapy: Janumet GDM: never DKA: never Severe hypoglycemia: never Pancreatitis: never Pancreatic imaging: normal on 2008 CT Other: edema limits rx options; fructosamine showed better glycemic control than A1c Interval history: no recent steroids; She says cbg's are well-controlled.   Pt also has hyperthyroidism (dx'ed 2019; US showed MNG; bx of RLL and RML nodules in 2019 showed beth cat 2).  pt states she feels well in general.  Past Medical History:  Diagnosis Date  . Abdominal aneurysm ([place])    Dr. Oneida Alar follows lLOV 2 ''17 per pt "around 2 cm"  . Anemia    as a child  . Arthritis    left ankle, right knee, right SI joint, wrists, lower back  . Breast cancer in female [facility])    Left  . COPD (chronic obstructive pulmonary disease) ([place])    ephysema-Dr. Chase Caller  . Dyspnea   . Headache    as a child would have terrible headaches during season changes  . 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 Hyperlipidemia   . Hypertension   . Multiple thyroid nodules   . Murmur, cardiac 1950  . Osteoporosis   . Pneumonia   . Pre-diabetes   . Requires continuous at home supplemental oxygen    2 L 24/7  . Sebaceous cyst    hairline sebaceous cyst left posterior neck to be excised 04-13-16 by Dr. Harlow Asa in [place] [facility]  . Sleep apnea    cpap used sometimes, uses oxygen concentrator 2 l/m nasally bedtime  . Varicella as child    Past Surgical History:   Procedure Laterality Date  . APPENDECTOMY     2008  . BREAST LUMPECTOMY Left 08/31/2017  . BREAST LUMPECTOMY WITH RADIOACTIVE SEED LOCALIZATION Left 08/10/2017   Procedure: BREAST LUMPECTOMY WITH RADIOACTIVE SEED LOCALIZATION;  Surgeon: Rolm Bookbinder, MD;  Location: [place];  Service: General;  Laterality: Left;  . [place]  . COLONOSCOPY    . COLONOSCOPY WITH PROPOFOL N/A 04/16/2016   Procedure: COLONOSCOPY WITH PROPOFOL;  Surgeon: Carol Ada, MD;  Location: [facility];  Service: Endoscopy;  Laterality: N/A;  . CYST REMOVAL NECK Left 04/13/2016   Procedure: EXCISION OF SEBACEOUS CYST LEFT POSTERIOR NECK;  Surgeon: Armandina Gemma, MD;  Location: [place];  Service: General;  Laterality: Left;  . Excision of Pelvic Absess, Right Ovary     2008  . RE-EXCISION OF BREAST CANCER,SUPERIOR MARGINS Left 08/31/2017   Procedure: RE-EXCISION OF LEFT  BREAST MARGINS ERAS PATHWAY;  Surgeon: Rolm Bookbinder, MD;  Location: [place];  Service: General;  Laterality: Left;  . TUBAL LIGATION  1980    Social History   Socioeconomic History  . Marital status: Divorced    Spouse name: Not on file  . Number of children: 1  . Years of education: 42  . Highest education level: Not  on file  Occupational History  . Occupation: Social worker, Technical sales engineer: [employer]  Social Needs  . Financial resource strain: Not very hard  . Food insecurity    Worry: Never true    Inability: Never true  . Transportation needs    Medical: No    Non-medical: No  Tobacco Use  . Smoking status: Former Smoker    Packs/day: 1.00    Years: 50.00    Pack years: 50.00    Types: Cigarettes    Quit date: 04/16/2011    Years since quitting: 7.9  . Smokeless tobacco: Never Used  . Tobacco comment: quit that date when she had to go to ER   Substance and Sexual Activity  . Alcohol use: Yes    Alcohol/week: 0.0 standard drinks    Comment: rare occasion  . Drug use: No    Comment: no marijuana  since 2012  . Sexual activity: Not Currently  Lifestyle  . Physical activity    Days per week: 0 days    Minutes per session: 0 min  . Stress: Only a little  Relationships  . Social connections    Talks on phone: More than three times a week    Gets together: More than three times a week    Attends religious service: More than 4 times per year    Active member of club or organization: Yes    Attends meetings of clubs or organizations: More than 4 times per year    Relationship status: Divorced  . Intimate partner violence    Fear of current or ex partner: Not on file    Emotionally abused: Not on file    Physically abused: Not on file    Forced sexual activity: Not on file  Other Topics Concern  . Not on file  Social History Narrative   HSG; [name], [place]. . Married '69 - 9 yrs/divorced. 1 son - '72; no grandchildren.   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 No h/o physical or sexual abuse. ACP - no living will - wants information. Provided packet of information. On 08/05/2011: she stated she was distant cousins to celebrities [name] and [name]       Current Outpatient Medications on File Prior to Visit  Medication Sig Dispense Refill  . Accu-Chek FastClix Lancets MISC 1 each by Does not apply route daily. Use to monitor glucose levels once per day; E11.9 102 each 2  . acetaminophen (TYLENOL) 500 MG tablet Take 1,000 mg by mouth every 6 (six) hours as needed.    . Albuterol Sulfate (PROAIR RESPICLICK) 595 (90 Base) MCG/ACT AEPB Inhale 1-2 puffs into the lungs every 6 (six) hours as needed (for wheezing/shortness of breath). 1 each 0  . amLODipine (NORVASC) 10 MG tablet Take 1 tablet by mouth once daily 90 tablet 0  . anastrozole (ARIMIDEX) 1 MG tablet Take 1 tablet (1 mg total) by mouth daily. 90 tablet 4  . atorvastatin (LIPITOR) 80 MG tablet Take 1 tablet (80 mg total) by mouth daily at 6 PM. 90 tablet 3   . Blood Glucose Monitoring Suppl (ACCU-CHEK AVIVA PLUS) w/Device KIT 1 Device by Does not apply route daily. Use to monitor glucose levels once per day; E11.9 1 kit 0  . Cholecalciferol (VITAMIN D) 125 MCG (5000 UT) CAPS Take 1 capsule by mouth daily.    [name] denosumab (PROLIA) 60 MG/ML SOLN injection Inject 60 mg into the skin every  6 (six) months. Administer in upper arm, thigh, or abdomen    . glucose blood (ACCU-CHEK AVIVA) test strip 1 each by Other route daily. Use to monitor glucose levels once per day; E11.9 100 each 2  . OXYGEN Inhale 2-3 L into the lungs continuous. When exerting self     . potassium chloride SA (K-DUR,KLOR-CON) 20 MEQ tablet Take 1 tablet (20 mEq total) by mouth 2 (two) times daily. 180 tablet 1  . roflumilast (DALIRESP) 500 MCG TABS tablet Take 1 tablet (500 mcg total) by mouth daily. 30 tablet 11  . SitaGLIPtin-MetFORMIN HCl (JANUMET XR) 50-1000 MG TB24 Take 1 tablet by mouth daily. 30 tablet 11  . sodium chloride (OCEAN) 0.65 % SOLN nasal spray Place 1 spray into both nostrils as needed (dryness).    . TRELEGY ELLIPTA 100-62.5-25 MCG/INH AEPB INHALE 1 DOSE INTO THE LUNGS DAILY. 84 each 1  . chlorthalidone (HYGROTON) 25 MG tablet Take 1 tablet (25 mg total) by mouth daily. 90 tablet 3   No current facility-administered medications on file prior to visit.     Allergies  Allergen Reactions  . Benicar [Olmesartan] Swelling    Swelling of face and arms   . [name] [Valsartan] Swelling    Swelling of face and arms   . Hydrocodone-Acetaminophen Nausea And Vomiting    Severe vomiting  . Lisinopril Cough  . Monosodium Glutamate Other (See Comments)    Facial swelling per pt  . Codeine Other (See Comments)    jittery  . Lead Acetate Rash  . Nickel Rash    Severe rash to infection: pt is allergic to all metals other than sterling silver or gold jewelry.     Family History  Problem Relation Age of Onset  . Heart disease Mother        MI - fatal  . Hypertension  Mother   . Stroke Father 65       fatal  . Alzheimer's disease Father   . Alzheimer's disease Brother   . Hyperlipidemia Brother   . Hypertension Brother   . Diabetes Brother   . Hypertension Brother   . Hyperlipidemia Brother   . Breast cancer Paternal Grandmother     There were no vitals taken for this visit.   Review of Systems She has lost weight, due to her efforts.     Objective:   Physical Exam     Assessment & Plan:  Hyperthyroidism: we discussed RAI rx.  She agrees to take.  Ret as scheduled Type 2 DM: well-controlled.  Please continue the same medication

## 2019-03-16 NOTE — Patient Instructions (Addendum)
Please do the RAI pill as scheduled.

## 2019-03-19 ENCOUNTER — Other Ambulatory Visit: Payer: Self-pay

## 2019-03-19 ENCOUNTER — Ambulatory Visit (HOSPITAL_COMMUNITY)
Admission: RE | Admit: 2019-03-19 | Discharge: 2019-03-19 | Disposition: A | Discharge status: Home / Self Care | Payer: Medicare Other | Source: Ambulatory Visit | Attending: Endocrinology | Admitting: Endocrinology

## 2019-03-19 DIAGNOSIS — E0590 Thyrotoxicosis, unspecified without thyrotoxic crisis or storm: Secondary | ICD-10-CM

## 2019-03-19 MED ORDER — SODIUM IODIDE I 131 CAPSULE
31.5000 | Freq: Once | INTRAVENOUS | Status: AC | PRN
  Administered 2019-03-19: 10:00:00 31.5 via ORAL

## 2019-03-20 ENCOUNTER — Telehealth: Payer: Self-pay | Admitting: Endocrinology

## 2019-03-20 ENCOUNTER — Other Ambulatory Visit: Payer: Medicare Other

## 2019-03-20 ENCOUNTER — Ambulatory Visit: Payer: Medicare Other | Admitting: Adult Health

## 2019-03-20 MED ORDER — JANUMET XR 50-1000 MG PO TB24: 2.0000 | ORAL_TABLET | Freq: Every day | ORAL | 3 refills | Status: DC

## 2019-03-20 NOTE — Telephone Encounter (Signed)
SitaGLIPtin-MetFORMIN HCl (JANUMET XR) 50-1000 MG TB24 180 tablet 3 03/20/2019    Sig - Route: Take 2 tablets by mouth daily. - Oral   Sent to pharmacy as: SitaGLIPtin-MetFORMIN HCl (JANUMET XR) 50-1000 MG Tablet SR 24 hr   E-Prescribing Status: Receipt confirmed by pharmacy (03/20/2019 11:31 AM EST)

## 2019-03-20 NOTE — Telephone Encounter (Signed)
Ok, I have sent a prescription to your pharmacy, to increase to 2 pills per day

## 2019-03-20 NOTE — Telephone Encounter (Signed)
Patient called to advise that she has been taking extra doses of Janumet due dietary intake.  She can not get it filled again until 03/30/19 and she has 2 doses left of her medicine.  Patient is asking for either samples or assistance with another medication to help control her blood sugar until she can refill  Please advise at [phone]

## 2019-03-20 NOTE — Telephone Encounter (Signed)
Were you aware she was taking "extra doses"? If not, I will call to inquire further.

## 2019-03-21 ENCOUNTER — Inpatient Hospital Stay: Payer: Medicare Other | Admitting: Adult Health

## 2019-03-21 ENCOUNTER — Inpatient Hospital Stay: Payer: Medicare Other | Attending: Adult Health

## 2019-03-21 ENCOUNTER — Other Ambulatory Visit: Payer: Self-pay

## 2019-03-21 ENCOUNTER — Encounter: Payer: Self-pay | Admitting: Adult Health

## 2019-03-21 VITALS — BP 157/71 | HR 93 | Temp 98.7°F | Resp 18 | Ht 64.5 in | Wt 204.4 lb

## 2019-03-21 DIAGNOSIS — Z9981 Dependence on supplemental oxygen: Secondary | ICD-10-CM | POA: Insufficient documentation

## 2019-03-21 DIAGNOSIS — Z170 Estrogen receptor positive status [ER+]: Secondary | ICD-10-CM

## 2019-03-21 DIAGNOSIS — C50412 Malignant neoplasm of upper-outer quadrant of left female breast: Secondary | ICD-10-CM

## 2019-03-21 DIAGNOSIS — J449 Chronic obstructive pulmonary disease, unspecified: Secondary | ICD-10-CM | POA: Diagnosis not present

## 2019-03-21 DIAGNOSIS — E876 Hypokalemia: Secondary | ICD-10-CM | POA: Diagnosis not present

## 2019-03-21 DIAGNOSIS — C50919 Malignant neoplasm of unspecified site of unspecified female breast: Secondary | ICD-10-CM | POA: Diagnosis not present

## 2019-03-21 DIAGNOSIS — M818 Other osteoporosis without current pathological fracture: Secondary | ICD-10-CM

## 2019-03-21 DIAGNOSIS — E2839 Other primary ovarian failure: Secondary | ICD-10-CM

## 2019-03-21 DIAGNOSIS — Z79811 Long term (current) use of aromatase inhibitors: Secondary | ICD-10-CM | POA: Diagnosis not present

## 2019-03-21 LAB — CBC WITH DIFFERENTIAL/PLATELET
Abs Immature Granulocytes: 0.03 10*3/uL (ref 0.00–0.07)
Basophils Absolute: 0.1 10*3/uL (ref 0.0–0.1)
Basophils Relative: 1 %
Eosinophils Absolute: 0.1 10*3/uL (ref 0.0–0.5)
Eosinophils Relative: 1 %
HCT: 35.4 % — ABNORMAL LOW (ref 36.0–46.0)
Hemoglobin: 11.9 g/dL — ABNORMAL LOW (ref 12.0–15.0)
Immature Granulocytes: 0 %
Lymphocytes Relative: 22 %
Lymphs Abs: 2.2 10*3/uL (ref 0.7–4.0)
MCH: 28.7 pg (ref 26.0–34.0)
MCHC: 33.6 g/dL (ref 30.0–36.0)
MCV: 85.3 fL (ref 80.0–100.0)
Monocytes Absolute: 0.8 10*3/uL (ref 0.1–1.0)
Monocytes Relative: 8 %
Neutro Abs: 6.9 10*3/uL (ref 1.7–7.7)
Neutrophils Relative %: 68 %
Platelets: 286 10*3/uL (ref 150–400)
RBC: 4.15 MIL/uL (ref 3.87–5.11)
RDW: 13.7 % (ref 11.5–15.5)
WBC: 10.1 10*3/uL (ref 4.0–10.5)
nRBC: 0 % (ref 0.0–0.2)

## 2019-03-21 LAB — COMPREHENSIVE METABOLIC PANEL
ALT: 8 U/L (ref 0–44)
AST: 13 U/L — ABNORMAL LOW (ref 15–41)
Albumin: 3.8 g/dL (ref 3.5–5.0)
Alkaline Phosphatase: 84 U/L (ref 38–126)
Anion gap: 15 (ref 5–15)
BUN: 20 mg/dL (ref 8–23)
CO2: 30 mmol/L (ref 22–32)
Calcium: 10.5 mg/dL — ABNORMAL HIGH (ref 8.9–10.3)
Chloride: 95 mmol/L — ABNORMAL LOW (ref 98–111)
Creatinine, Ser: 0.8 mg/dL (ref 0.44–1.00)
GFR calc Af Amer: >60 mL/min (ref >60)
GFR calc non Af Amer: >60 mL/min (ref >60)
Glucose, Bld: 106 mg/dL — ABNORMAL HIGH (ref 70–99)
Potassium: 3.1 mmol/L — ABNORMAL LOW (ref 3.5–5.1)
Sodium: 140 mmol/L (ref 135–145)
Total Bilirubin: 0.4 mg/dL (ref 0.3–1.2)
Total Protein: 7.1 g/dL (ref 6.5–8.1)

## 2019-03-21 NOTE — Progress Notes (Signed)
[place]  [phone] Fax:(336) [phone]     ID: Victoria [name] DOB: 04/16/1946  MR#: [id number]  TML#:[id number]  Patient Care Team: Janith Lima, MD as PCP - General (Internal Medicine) Troy Sine, MD as PCP - Cardiology (Cardiology) Brand Males, MD as Consulting Physician (Pulmonary Disease) Arvella Nigh, MD as Consulting Physician (Obstetrics and Gynecology) Magrinat, Virgie Dad, MD as Consulting Physician (Oncology) Rolm Bookbinder, MD as Consulting Physician (General Surgery) Gery Pray, MD as Consulting Physician (Radiation Oncology) Troy Sine, MD as Consulting Physician (Cardiology) Delice Bison, Charlestine Massed, NP as Nurse Practitioner (Hematology and Oncology) Philemon Kingdom, MD as Consulting Physician (Internal Medicine) Parrett, Fonnie Mu, NP as Nurse Practitioner (Pulmonary Disease) Renato Shin, MD as Consulting Physician (Endocrinology) OTHER MD:  CHIEF COMPLAINT: Estrogen receptor positive breast cancer   CURRENT TREATMENT: Anastrozole   INTERVAL HISTORY: Victoria [name] returns today for follow up of her estrogen receptor positive breast cancer.  She is unaccompanied.  She continues on anastrozole with good tolerance.  She has a hot flash on occasion.  She has no issues with this, and is taking it regularly.  Her most recent bone density on 12/08/2016 showed a T-score of -2.4.  For her bones she is taking calcium, vitamin d, and receives prolia every 6 months.    Bilateral diagnostic mammography with tomography 12/04/2018 shows breast density category B.  There was no evidence of malignancy.  She also underwent CT lung cancer screening on 01/10/2019 for her 50 pack year tobacco history.  It was negative and repeat imaging was recommended in one year.    She is seeing Dr. Loanne Drilling for her thyroid issues and type two diabetes.    REVIEW OF SYSTEMS: Victoria [name] is doing well today.  She says she is feeling well.  She  continues on oxygen for COPD, and sees Dr. Chase Caller for this.  She is feeling well today.  She is not working due to her risk for contracting COVID19.  Victoria [name] is independent in her ADLs for the most part.  She says her son takes her trash out for her on Thursdays.  Sometimes she manages her shortness of breath with energy conservation and prolonging certain tasks that may elicit shortness of breath.  She takes breaks when needed.    Victoria [name] is feeling well.  She has no fever, chills, chest pain, palpitations, cough, bowel/bladder changes, nausea or vomiting.  A detailed ROS Was otherwise non contributory.      HISTORY OF CURRENT ILLNESS: From the original intake note:  Victoria [name] had routine screening mammography on 06/16/2017 showing a possible abnormality in the left breast. She underwent unilateral left diagnostic mammography with tomography and left breast ultrasonography at [facility] on 06/22/2017 showing: breast density category B. Suspicious left breast mass in the 2:30 position upper outer quadrant measuring 0.6 x 0.7 x 0.6 cm located 2 cm from the nipple. Palpable thickening with distortion. There was no sonographic evidence of lest axillary lymphadenopathy.  Accordingly on 06/24/2017 she proceeded to biopsy of the left breast mass in question. The pathology from this procedure showed ([id number]):  Invasive mammary carcinoma, grade 1. E-cadherin is positive supporting Ductal carcinoma diagnosis. Prognostic indicators significant for: estrogen receptor, 100% positive and progesterone receptor, 100% positive, both with strong staining intensity. Proliferation marker Ki67 at 5%. HER2 not amplified with ratios of HER2/CEP17 signals 1.21 and average copies per cell 2.00.  The patient's subsequent history is as detailed below.   PAST MEDICAL HISTORY: Past  Medical History:  Diagnosis Date  . Abdominal aneurysm ([place])    Dr. Oneida Alar follows lLOV 2 ''17 per pt "around 2 cm"  .  Anemia    as a child  . Arthritis    left ankle, right knee, right SI joint, wrists, lower back  . Breast cancer in female [facility])    Left  . COPD (chronic obstructive pulmonary disease) ([place])    ephysema-Dr. Chase Caller  . Dyspnea   . Headache    as a child would have terrible headaches during season changes  . 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 Hyperlipidemia   . Hypertension   . Multiple thyroid nodules   . Murmur, cardiac 1950  . Osteoporosis   . Pneumonia   . Pre-diabetes   . Requires continuous at home supplemental oxygen    2 L 24/7  . Sebaceous cyst    hairline sebaceous cyst left posterior neck to be excised 04-13-16 by Dr. Harlow Asa in [place] [facility]  . Sleep apnea    cpap used sometimes, uses oxygen concentrator 2 l/m nasally bedtime  . Varicella as child    PAST SURGICAL HISTORY: Past Surgical History:  Procedure Laterality Date  . APPENDECTOMY     2008  . BREAST LUMPECTOMY Left 08/31/2017  . BREAST LUMPECTOMY WITH RADIOACTIVE SEED LOCALIZATION Left 08/10/2017   Procedure: BREAST LUMPECTOMY WITH RADIOACTIVE SEED LOCALIZATION;  Surgeon: Rolm Bookbinder, MD;  Location: [facility];  Service: General;  Laterality: Left;  . [place]  . COLONOSCOPY    . COLONOSCOPY WITH PROPOFOL N/A 04/16/2016   Procedure: COLONOSCOPY WITH PROPOFOL;  Surgeon: Carol Ada, MD;  Location: [facility];  Service: Endoscopy;  Laterality: N/A;  . CYST REMOVAL NECK Left 04/13/2016   Procedure: EXCISION OF SEBACEOUS CYST LEFT POSTERIOR NECK;  Surgeon: Armandina Gemma, MD;  Location: [place];  Service: General;  Laterality: Left;  . Excision of Pelvic Absess, Right Ovary     2008  . RE-EXCISION OF BREAST CANCER,SUPERIOR MARGINS Left 08/31/2017   Procedure: RE-EXCISION OF LEFT  BREAST MARGINS ERAS PATHWAY;  Surgeon: Rolm Bookbinder, MD;  Location: [facility];  Service: General;  Laterality: Left;  . TUBAL LIGATION  1980    FAMILY HISTORY Family History  Problem Relation Age  of Onset  . Heart disease Mother        MI - fatal  . Hypertension Mother   . Stroke Father 38       fatal  . Alzheimer's disease Father   . Alzheimer's disease Brother   . Hyperlipidemia Brother   . Hypertension Brother   . Diabetes Brother   . Hypertension Brother   . Hyperlipidemia Brother   . Breast cancer Paternal Grandmother   The patient's father died at age 49 due to a stroke and prostate cancer.  The patient's mother died at age 75 due to a heart attack and blood clot. The patient has 3 brothers and no sisters. She notes a paternal grandmother who was diagnosed with breast cancer at age 56. She notes a paternal cousin with lung cancer. She denies a family history of ovarian cancer.   GYNECOLOGIC HISTORY:  No LMP recorded. Patient is postmenopausal. Menarche: 73 years old Age at first live birth: 73 years old [place] P1 LMP: age 35 The patient had a right ovarian cyst which required removal. She notes that she still has her left ovary. She was on HRT for a short amount  of time, but discontinued due to negative side affects. She was placed on Estroven as well.    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 She helps individuals with [employer] and program assisting on the computer. She is divorced. The patient's son, Arva Slaugh, is disabled and works part-time at [employer] in [place]. The patient has 3 grandchildren and 3 great-grandchildren. She attends [employer].      ADVANCED DIRECTIVES:  Son, Eliseo Gum and daughter in law, Butch Penny   HEALTH MAINTENANCE: Social History   Tobacco Use  . Smoking status: Former Smoker    Packs/day: 1.00    Years: 50.00    Pack years: 50.00    Types: Cigarettes    Quit date: 04/16/2011    Years since quitting: 7.9  . Smokeless tobacco: Never Used  . Tobacco comment: quit that date when she had to go to ER    Substance Use Topics  . Alcohol use: Yes    Alcohol/week: 0.0 standard drinks    Comment: rare occasion  . Drug use: No    Comment: no marijuana since 2012     Colonoscopy: December 2017/ Dr. Benson Norway  PAP: September 2016  Bone density: 12/08/2016 showed a T score of -2.4   Allergies  Allergen Reactions  . Benicar [Olmesartan] Swelling    Swelling of face and arms   . [name] [Valsartan] Swelling    Swelling of face and arms   . Hydrocodone-Acetaminophen Nausea And Vomiting    Severe vomiting  . Lisinopril Cough  . Monosodium Glutamate Other (See Comments)    Facial swelling per pt  . Codeine Other (See Comments)    jittery  . Lead Acetate Rash  . Nickel Rash    Severe rash to infection: pt is allergic to all metals other than sterling silver or gold jewelry.     Current Outpatient Medications  Medication Sig Dispense Refill  . Accu-Chek FastClix Lancets MISC 1 each by Does not apply route daily. Use to monitor glucose levels once per day; E11.9 102 each 2  . acetaminophen (TYLENOL) 500 MG tablet Take 1,000 mg by mouth every 6 (six) hours as needed.    . Albuterol Sulfate (PROAIR RESPICLICK) 245 (90 Base) MCG/ACT AEPB Inhale 1-2 puffs into the lungs every 6 (six) hours as needed (for wheezing/shortness of breath). 1 each 0  . amLODipine (NORVASC) 10 MG tablet Take 1 tablet by mouth once daily 90 tablet 0  . anastrozole (ARIMIDEX) 1 MG tablet Take 1 tablet (1 mg total) by mouth daily. 90 tablet 4  . atorvastatin (LIPITOR) 80 MG tablet Take 1 tablet (80 mg total) by mouth daily at 6 PM. 90 tablet 3  . Blood Glucose Monitoring Suppl (ACCU-CHEK AVIVA PLUS) w/Device KIT 1 Device by Does not apply route daily. Use to monitor glucose levels once per day; E11.9 1 kit 0  . Cholecalciferol (VITAMIN D) 125 MCG (5000 UT) CAPS Take 1 capsule by mouth daily.    [name] denosumab (PROLIA) 60 MG/ML SOLN injection Inject 60 mg into the skin every 6 (six) months. Administer in upper arm, thigh, or  abdomen    . glucose blood (ACCU-CHEK AVIVA) test strip 1 each by Other route daily. Use to monitor glucose levels once per day; E11.9 100 each 2  . OXYGEN Inhale 2-3 L into the lungs continuous. When exerting self     . potassium chloride  SA (K-DUR,KLOR-CON) 20 MEQ tablet Take 1 tablet (20 mEq total) by mouth 2 (two) times daily. 180 tablet 1  . roflumilast (DALIRESP) 500 MCG TABS tablet Take 1 tablet (500 mcg total) by mouth daily. 30 tablet 11  . SitaGLIPtin-MetFORMIN HCl (JANUMET XR) 50-1000 MG TB24 Take 2 tablets by mouth daily. 180 tablet 3  . sodium chloride (OCEAN) 0.65 % SOLN nasal spray Place 1 spray into both nostrils as needed (dryness).    . TRELEGY ELLIPTA 100-62.5-25 MCG/INH AEPB INHALE 1 DOSE INTO THE LUNGS DAILY. 84 each 1  . chlorthalidone (HYGROTON) 25 MG tablet Take 1 tablet (25 mg total) by mouth daily. 90 tablet 3   No current facility-administered medications for this visit.     OBJECTIVE:  Vitals:   03/21/19 1051  BP: (!) 157/71  Pulse: 93  Resp: 18  Temp: 98.7 F (37.1 C)  SpO2: 100%     Body mass index is 34.54 kg/m.   Wt Readings from Last 3 Encounters:  03/21/19 204 lb 6.4 oz (92.7 kg)  01/29/19 212 lb 3.2 oz (96.3 kg)  01/19/19 213 lb 9.6 oz (96.9 kg)  ECOG FS:2 - Symptomatic, <50% confined to bed GENERAL: Patient is chronically ill appearing in wheelchair HEENT:  Sclerae anicteric.  Mask in place. Neck is supple.  NODES:  No cervical, supraclavicular, or axillary lymphadenopathy palpated.  BREAST EXAM:  Left breast s/p lumpectomy, no sign of local recurrence.  Right breast is benign LUNGS:  Clear to auscultation bilaterally.  No wheezes or rhonchi. HEART:  Regular rate and rhythm. No murmur appreciated. ABDOMEN:  Soft, nontender.  Positive, normoactive bowel sounds. No organomegaly palpated. MSK:  No focal spinal tenderness to palpation. Full range of motion bilaterally in the upper extremities. EXTREMITIES:  No peripheral edema.   SKIN:  Clear  with no obvious rashes or skin changes. No nail dyscrasia. NEURO:  Nonfocal. Well oriented.  Appropriate affect.      LAB RESULTS:  CMP     Component Value Date/Time   NA 140 03/21/2019 1027   NA 139 01/08/2019 1025   K 3.1 (L) 03/21/2019 1027   CL 95 (L) 03/21/2019 1027   CO2 30 03/21/2019 1027   GLUCOSE 106 (H) 03/21/2019 1027   BUN 20 03/21/2019 1027   BUN 15 01/08/2019 1025   CREATININE 0.80 03/21/2019 1027   CREATININE 1.13 (H) 12/11/2018 1403   CALCIUM 10.5 (H) 03/21/2019 1027   PROT 7.1 03/21/2019 1027   PROT 6.6 01/08/2019 1025   ALBUMIN 3.8 03/21/2019 1027   ALBUMIN 4.1 01/08/2019 1025   AST 13 (L) 03/21/2019 1027   AST 11 (L) 12/11/2018 1403   ALT 8 03/21/2019 1027   ALT 7 12/11/2018 1403   ALKPHOS 84 03/21/2019 1027   BILITOT 0.4 03/21/2019 1027   BILITOT 0.3 01/08/2019 1025   BILITOT 0.3 12/11/2018 1403   GFRNONAA >60 03/21/2019 1027   GFRNONAA 48 (L) 12/11/2018 1403   GFRAA >60 03/21/2019 1027   GFRAA 55 (L) 12/11/2018 1403    Lab Results  Component Value Date   TOTALPROTELP 6.7 02/28/2014   ALBUMINELP 57.4 02/28/2014   A1GS 4.9 02/28/2014   A2GS 8.5 02/28/2014   BETS 7.5 (H) 02/28/2014   BETA2SER 5.3 02/28/2014   GAMS 16.4 02/28/2014   MSPIKE 0.31 02/28/2014   SPEI SEE NOTE 02/28/2014    No results found for: KPAFRELGTCHN, LAMBDASER, KAPLAMBRATIO  Lab Results  Component Value Date   WBC 10.1 03/21/2019   NEUTROABS 6.9 03/21/2019  HGB 11.9 (L) 03/21/2019   HCT 35.4 (L) 03/21/2019   MCV 85.3 03/21/2019   PLT 286 03/21/2019    _0 @  No results found for: LABCA2  No components found for: [id number]  No results for input(s): INR in the last 168 hours.  No results found for: LABCA2  No results found for: BOF751  No results found for: WCH852  No results found for: DPO242  No results found for: CA2729  No components found for: HGQUANT  No results found for: CEA1 / No results found for: CEA1   No results found  for: AFPTUMOR  No results found for: CHROMOGRNA  No results found for: PSA1  Appointment on 03/21/2019  Component Date Value Ref Range Status  . Sodium 03/21/2019 140  135 - 145 mmol/L Final  . Potassium 03/21/2019 3.1* 3.5 - 5.1 mmol/L Final  . Chloride 03/21/2019 95* 98 - 111 mmol/L Final  . CO2 03/21/2019 30  22 - 32 mmol/L Final  . Glucose, Bld 03/21/2019 106* 70 - 99 mg/dL Final  . BUN 03/21/2019 20  8 - 23 mg/dL Final  . Creatinine, Ser 03/21/2019 0.80  0.44 - 1.00 mg/dL Final  . Calcium 03/21/2019 10.5* 8.9 - 10.3 mg/dL Final  . Total Protein 03/21/2019 7.1  6.5 - 8.1 g/dL Final  . Albumin 03/21/2019 3.8  3.5 - 5.0 g/dL Final  . AST 03/21/2019 13* 15 - 41 U/L Final  . ALT 03/21/2019 8  0 - 44 U/L Final  . Alkaline Phosphatase 03/21/2019 84  38 - 126 U/L Final  . Total Bilirubin 03/21/2019 0.4  0.3 - 1.2 mg/dL Final  . GFR calc non Af Amer 03/21/2019 >60  >60 mL/min Final  . GFR calc Af Amer 03/21/2019 >60  >60 mL/min Final  . Anion gap 03/21/2019 15  5 - 15 Final   Performed at [facility], [place] [address], [place], [place] [address]  . WBC 03/21/2019 10.1  4.0 - 10.5 K/uL Final  . RBC 03/21/2019 4.15  3.87 - 5.11 MIL/uL Final  . Hemoglobin 03/21/2019 11.9* 12.0 - 15.0 g/dL Final  . HCT 03/21/2019 35.4* 36.0 - 46.0 % Final  . MCV 03/21/2019 85.3  80.0 - 100.0 fL Final  . MCH 03/21/2019 28.7  26.0 - 34.0 pg Final  . MCHC 03/21/2019 33.6  30.0 - 36.0 g/dL Final  . RDW 03/21/2019 13.7  11.5 - 15.5 % Final  . Platelets 03/21/2019 286  150 - 400 K/uL Final  . nRBC 03/21/2019 0.0  0.0 - 0.2 % Final  . Neutrophils Relative % 03/21/2019 68  % Final  . Neutro Abs 03/21/2019 6.9  1.7 - 7.7 K/uL Final  . Lymphocytes Relative 03/21/2019 22  % Final  . Lymphs Abs 03/21/2019 2.2  0.7 - 4.0 K/uL Final  . Monocytes Relative 03/21/2019 8  % Final  . Monocytes Absolute 03/21/2019 0.8  0.1 - 1.0 K/uL Final  . Eosinophils Relative 03/21/2019 1  % Final  .  Eosinophils Absolute 03/21/2019 0.1  0.0 - 0.5 K/uL Final  . Basophils Relative 03/21/2019 1  % Final  . Basophils Absolute 03/21/2019 0.1  0.0 - 0.1 K/uL Final  . Immature Granulocytes 03/21/2019 0  % Final  . Abs Immature Granulocytes 03/21/2019 0.03  0.00 - 0.07 K/uL Final   Performed at [facility], [place] [address] [place], [place] [address]    (this displays the last labs from the last 3 days)  Lab  Results  Component Value Date   TOTALPROTELP 6.7 02/28/2014   ALBUMINELP 57.4 02/28/2014   A1GS 4.9 02/28/2014   A2GS 8.5 02/28/2014   BETS 7.5 (H) 02/28/2014   BETA2SER 5.3 02/28/2014   GAMS 16.4 02/28/2014   MSPIKE 0.31 02/28/2014   SPEI SEE NOTE 02/28/2014   (this displays SPEP labs)  No results found for: KPAFRELGTCHN, LAMBDASER, KAPLAMBRATIO (kappa/lambda light chains)  No results found for: HGBA, HGBA2QUANT, HGBFQUANT, HGBSQUAN (Hemoglobinopathy evaluation)   No results found for: LDH  No results found for: IRON, TIBC, IRONPCTSAT (Iron and TIBC)  No results found for: FERRITIN  Urinalysis    Component Value Date/Time   COLORURINE YELLOW 01/18/2017 [place] 01/18/2017 1605   LABSPEC 1.015 01/18/2017 1605   PHURINE 7.0 01/18/2017 1605   GLUCOSEU NEGATIVE 01/18/2017 1605   HGBUR NEGATIVE 01/18/2017 1605   BILIRUBINUR NEGATIVE 01/18/2017 1605   KETONESUR NEGATIVE 01/18/2017 1605   PROTEINUR 30 (A) 04/18/2011 1942   UROBILINOGEN 0.2 01/18/2017 1605   NITRITE NEGATIVE 01/18/2017 1605   LEUKOCYTESUR NEGATIVE 01/18/2017 1605     STUDIES: Nm Thyroid Mult Uptake W/imaging  Result Date: 03/02/2019 CLINICAL DATA:  Hyperthyroidism EXAM: THYROID SCAN AND UPTAKE - 4 AND 24 HOURS TECHNIQUE: Following oral administration of I-123 capsule, anterior planar imaging was acquired at 24 hours. Thyroid uptake was calculated with a thyroid probe at 4-6 hours and 24 hours. RADIOPHARMACEUTICALS:  Four hundred twenty-five uCi I-123 sodium  iodide p.o. COMPARISON:  None. FINDINGS: Mildly heterogeneous uptake in the thyroid lobes suggesting a multinodular goiter with no dominant nodules noted. 4 hour I-123 uptake = 12.5% (normal 5-20%) 24 hour I-123 uptake = 33.6% (normal 10-35%) IMPRESSION: Heterogeneous uptake in the thyroid lobes consistent with multiple small nodules suggesting a multinodular goiter. Iodine uptake is within the normal range in the thyroid at 4 and 24 hours. Please select correct template: Electronically Signed   By: Dorise Bullion III M.D   On: 03/02/2019 13:54   Nm Rai Therapy For Hyperthyroidism  Result Date: 03/19/2019 CLINICAL DATA:  Hyperthyroidism secondary to toxic multinodular goiter. EXAM: RADIOACTIVE IODINE THERAPY FOR HYPERTHYROIDISM COMPARISON:  Thyroid uptake and scan 03/01/2019 TECHNIQUE: Radioactive iodine prescribed by Dr. Clovis Riley. The risks and benefits of radioactive iodine therapy were discussed with the patient in detail by Dr. Polly Cobia. Alternative therapies were also mentioned. Radiation safety was discussed with the patient, including how to protect the general public from exposure. There were no barriers to communication. Written consent was obtained. The patient then received a capsule containing the radiopharmaceutical. The patient will follow-up with the referring physician. RADIOPHARMACEUTICALS:  31.5 mCi I-131 sodium iodide orally IMPRESSION: Per oral administration of I-131 sodium iodide for the treatment of hyperthyroidism. Electronically Signed   By: Kerby Moors M.D.   On: 03/19/2019 10:52    ELIGIBLE FOR AVAILABLE RESEARCH PROTOCOL: no  ASSESSMENT: 73 y.o. [place] woman status post left breast upper outer quadrant biopsy 06/24/2017 for a clinical T1b N0, stage IA invasive ductal carcinoma, grade 1, estrogen and progesterone receptor positive, HER-2 not amplified, with an MIB-105%  (1) status post left lumpectomy without sentinel lymph node sampling on 08/10/2017 showed invasive ductal  carcinoma grade II, spanning 1.2 cm. High grade DCIS. Anterior margin was was focally positive for invasive ductal carcinoma. All other margins clear.   (a) additional surgery for margin clearance 08/31/2017 showed atypical ductal hyperplasia, negative margins  (2) given the patient's age, the tumor size, grade and prognostic profile, will forego Oncotype and avoid  chemotherapy  (3) may forgo adjuvant radiation if comfortable with anti-estrogen therapy  (4) anastrozole started 08/30/2017  (5) osteoporosis: bone density on 02/27/2014 showed a T score of -2.9   (a) bone density on 12/08/2016 showed a T score of -2.4 (osteopenia).  (6) anastrozole to start 09/14/2017  (7) incidentally noted adrenal adenomas stable on most recent scan (10/28/2017).  (8) lung cancer screening:  (a) low-dose CT of the chest 10/28/2017 was benign.  There was significant aortic atherosclerosis and left main coronary artery disease, severe emphysema, and likely benign adrenal lesions   PLAN: Victoria [name] is doing well today.  She has no sign of breast cancer recurrence.  She continues on Anastrozole with good tolerance.  This is favorable.  She does have osteopenia.  She is due for repeat DEXA.  She receives Prolia every 6 months with her PCP.  She thinks this may be overdue and is going to call her PCP to see when her next one is due.  I have ordered a repeat bone density test to be done in the next few weeks.    I reviewed Sonna's labs with her in detail.  These are stable.  She has slight hypokalemia, I gave her a handout on potassium rich foods, however cautioned her to avoid the sugary ones listed. We discussed healthy diet and activity.    We will see L[place]a back in about 6 months.  She was recommended to continue with the appropriate pandemic precautions.  She knows to call for any other issue that may develop before the next visit.  A total of (40) minutes of face-to-face time was spent with this patient with  greater than 50% of that time in counseling and care-coordination.   Wilber Bihari, NP  03/21/19 11:29 AM Medical Oncology and Hematology [facility] [place], [place] [address] Tel. [phone]    Fax. [phone]

## 2019-03-21 NOTE — Patient Instructions (Signed)
Potassium Content of Foods  Potassium is a mineral found in many foods and drinks. It affects how the heart works, and helps keep fluids and minerals balanced in the body. The amount of potassium you need each day depends on your age and any medical conditions you may have. Talk to your health care provider or dietitian about how much potassium you need. The following lists of foods provide the general serving size for foods and the approximate amount of potassium in each serving, listed in milligrams (mg). Actual values may vary depending on the product and how it is processed. High in potassium The following foods and beverages have 200 mg or more of potassium per serving:  Apricots (raw) - 2 have 200 mg of potassium.  Apricots (dry) - 5 have 200 mg of potassium.  Artichoke - 1 medium has 345 mg of potassium.  Avocado -  fruit has 245 mg of potassium.  Banana - 1 medium fruit has 425 mg of potassium.  Lima or baked beans (canned) -  cup has 280 mg of potassium.  White beans (canned) -  cup has 595 mg potassium.  Beef roast - 3 oz has 320 mg of potassium.  Ground beef - 3 oz has 270 mg of potassium.  Beets (raw or cooked) -  cup has 260 mg of potassium.  Bran muffin - 2 oz has 300 mg of potassium.  Broccoli (cooked) -  cup has 230 mg of potassium.  Brussels sprouts -  cup has 250 mg of potassium.  Cantaloupe -  cup has 215 mg of potassium.  Cereal, 100% bran -  cup has 200-400 mg of potassium.  Cheeseburger -1 single fast food burger has 225-400 mg of potassium.  Chicken - 3 oz has 220 mg of potassium.  Clams (canned) - 3 oz has 535 mg of potassium.  Crab - 3 oz has 225 mg of potassium.  Dates - 5 have 270 mg of potassium.  Dried beans and peas -  cup has 300-475 mg of potassium.  Figs (dried) - 2 have 260 mg of potassium.  Fish (halibut, tuna, cod, snapper) - 3 oz has 480 mg of potassium.  Fish (salmon, haddock, swordfish, perch) - 3 oz has 300 mg of  potassium.  Fish (tuna, canned) - 3 oz has 200 mg of potassium.  French fries (fast food) - 3 oz has 470 mg of potassium.  Granola with fruit and nuts -  cup has 200 mg of potassium.  Grapefruit juice -  cup has 200 mg of potassium.  Honeydew melon -  cup has 200 mg of potassium.  Kale (raw) - 1 cup has 300 mg of potassium.  Kiwi - 1 medium fruit has 240 mg of potassium.  Kohlrabi, rutabaga, parsnips -  cup has 280 mg of potassium.  Lentils -  cup has 365 mg of potassium.  Mango - 1 each has 325 mg of potassium.  Milk (nonfat, low-fat, whole, buttermilk) - 1 cup has 350-380 mg of potassium.  Milk (chocolate) - 1 cup has 420 mg of potassium  Molasses - 1 Tbsp has 295 mg of potassium.  Mushrooms -  cup has 280 mg of potassium.  Nectarine - 1 each has 275 mg of potassium.  Nuts (almonds, peanuts, hazelnuts, [place], cashew, mixed) - 1 oz has 200 mg of potassium.  Nuts (pistachios) - 1 oz has 295 mg of potassium.  Orange - 1 fruit has 240 mg of potassium.  Orange juice -    cup has 235 mg of potassium.  Papaya -  medium fruit has 390 mg of potassium.  Peanut butter (chunky) - 2 Tbsp has 240 mg of potassium.  Peanut butter (smooth) - 2 Tbsp has 210 mg of potassium.  Pear - 1 medium (200 mg) of potassium.  Pomegranate - 1 whole fruit has 400 mg of potassium.  Pomegranate juice -  cup has 215 mg of potassium.  Pork - 3 oz has 350 mg of potassium.  Potato chips (salted) - 1 oz has 465 mg of potassium.  Potato (baked with skin) - 1 medium has 925 mg of potassium.  Potato (boiled) -  cup has 255 mg of potassium.  Potato (Mashed) -  cup has 330 mg of potassium.  Prune juice -  cup has 370 mg of potassium.  Prunes - 5 have 305 mg of potassium.  Pudding (chocolate) -  cup has 230 mg of potassium.  Pumpkin (canned) -  cup has 250 mg of potassium.  Raisins (seedless) -  cup has 270 mg of potassium.  Seeds (sunflower or pumpkin) - 1 oz has 240 mg of  potassium.  Soy milk - 1 cup has 300 mg of potassium.  Spinach (cooked) - 1/2 cup has 420 mg of potassium.  Spinach (canned) -  cup has 370 mg of potassium.  Sweet potato (baked with skin) - 1 medium has 450 mg of potassium.  Swiss chard -  cup has 480 mg of potassium.  Tomato or vegetable juice -  cup has 275 mg of potassium.  Tomato (sauce or puree) -  cup has 400-550 mg of potassium.  Tomato (raw) - 1 medium has 290 mg of potassium.  Tomato (canned) -  cup has 200-300 mg of potassium.  Turkey - 3 oz has 250 mg of potassium.  Wheat germ - 1 oz has 250 mg of potassium.  Winter squash -  cup has 250 mg of potassium.  Yogurt (plain or fruited) - 6 oz has 260-435 mg of potassium.  Zucchini -  cup has 220 mg of potassium. Moderate in potassium The following foods and beverages have 50-200 mg of potassium per serving:  Apple - 1 fruit has 150 mg of potassium  Apple juice -  cup has 150 mg of potassium  Applesauce -  cup has 90 mg of potassium  Apricot nectar -  cup has 140 mg of potassium  Asparagus (small spears) -  cup has 155 mg of potassium  Asparagus (large spears) - 6 have 155 mg of potassium  Bagel (cinnamon raisin) - 1 four-inch bagel has 130 mg of potassium  Bagel (egg or plain) - 1 four- inch bagel has 70 mg of potassium  Beans (green) -  cup has 90 mg of potassium  Beans (yellow) -  cup has 190 mg of potassium  Beer, regular - 12 oz has 100 mg of potassium  Beets (canned) -  cup has 125 mg of potassium  Blackberries -  cup has 115 mg of potassium  Blueberries -  cup has 60 mg of potassium  Bread (whole wheat) - 1 slice has 70 mg of potassium  Broccoli (raw) -  cup has 145 mg of potassium  Cabbage -  cup has 150 mg of potassium  Carrots (cooked or raw) -  cup has 180 mg of potassium  Cauliflower (raw) -  cup has 150 mg of potassium  Celery (raw) -  cup has 155 mg of potassium  Cereal, bran   flakes -  cup has 120-150 mg  of potassium  Cheese (cottage) -  cup has 110 mg of potassium  Cherries - 10 have 150 mg of potassium  Chocolate - 1 oz bar has 165 mg of potassium  Coffee (brewed) - 6 oz has 90 mg of potassium  Corn -  cup or 1 ear has 195 mg of potassium  Cucumbers -  cup has 80 mg of potassium  Egg - 1 large egg has 60 mg of potassium  Eggplant -  cup has 60 mg of potassium  Endive (raw) -  cup has 80 mg of potassium  English muffin - 1 has 65 mg of potassium  Fish (ocean perch) - 3 oz has 192 mg of potassium  Frankfurter, beef or pork - 1 has 75 mg of potassium  Fruit cocktail -  cup has 115 mg of potassium  Grape juice -  cup has 170 mg of potassium  Grapefruit -  fruit has 175 mg of potassium  Grapes -  cup has 155 mg of potassium  Greens: kale, turnip, collard -  cup has 110-150 mg of potassium  Ice cream or frozen yogurt (chocolate) -  cup has 175 mg of potassium  Ice cream or frozen yogurt (vanilla) -  cup has 120-150 mg of potassium  Lemons, limes - 1 each has 80 mg of potassium  Lettuce - 1 cup has 100 mg of potassium  Mixed vegetables -  cup has 150 mg of potassium  Mushrooms, raw -  cup has 110 mg of potassium  Nuts (walnuts, pecans, or macadamia) - 1 oz has 125 mg of potassium  Oatmeal -  cup has 80 mg of potassium  Okra -  cup has 110 mg of potassium  Onions -  cup has 120 mg of potassium  Peach - 1 has 185 mg of potassium  Peaches (canned) -  cup has 120 mg of potassium  Pears (canned) -  cup has 120 mg of potassium  Peas, green (frozen) -  cup has 90 mg of potassium  Peppers (Green) -  cup has 130 mg of potassium  Peppers (Red) -  cup has 160 mg of potassium  Pineapple juice -  cup has 165 mg of potassium  Pineapple (fresh or canned) -  cup has 100 mg of potassium  Plums - 1 has 105 mg of potassium  Pudding, vanilla -  cup has 150 mg of potassium  Raspberries -  cup has 90 mg of potassium  Rhubarb -  cup has  115 mg of potassium  Rice, wild -  cup has 80 mg of potassium  Shrimp - 3 oz has 155 mg of potassium  Spinach (raw) - 1 cup has 170 mg of potassium  Strawberries -  cup has 125 mg of potassium  Summer squash -  cup has 175-200 mg of potassium  Swiss chard (raw) - 1 cup has 135 mg of potassium  Tangerines - 1 fruit has 140 mg of potassium  Tea, brewed - 6 oz has 65 mg of potassium  Turnips -  cup has 140 mg of potassium  Watermelon -  cup has 85 mg of potassium  Wine (Red, table) - 5 oz has 180 mg of potassium  Wine (White, table) - 5 oz 100 mg of potassium Low in potassium The following foods and beverages have less than 50 mg of potassium per serving.  Bread (white) - 1 slice has 30 mg of potassium    Carbonated beverages - 12 oz has less than 5 mg of potassium  Cheese - 1 oz has 20-30 mg of potassium  Cranberries -  cup has 45 mg of potassium  Cranberry juice cocktail -  cup has 20 mg of potassium  Fats and oils - 1 Tbsp has less than 5 mg of potassium  Hummus - 1 Tbsp has 32 mg of potassium  Nectar (papaya, mango, or pear) -  cup has 35 mg of potassium  Rice (white or brown) -  cup has 50 mg of potassium  Spaghetti or macaroni (cooked) -  cup has 30 mg of potassium  Tortilla, flour or corn - 1 has 50 mg of potassium  Waffle - 1 four-inch waffle has 50 mg of potassium  Water chestnuts -  cup has 40 mg of potassium Summary  Potassium is a mineral found in many foods and drinks. It affects how the heart works, and helps keep fluids and minerals balanced in the body.  The amount of potassium you need each day depends on your age and any existing medical conditions you may have. Your health care provider or dietitian may recommend an amount of potassium that you should have each day. This information is not intended to replace advice given to you by your health care provider. Make sure you discuss any questions you have with your health care provider.  Document Released: 12/15/2004 Document Revised: 04/15/2017 Document Reviewed: 07/28/2016 Elsevier Patient Education  2020 Elsevier Inc.  

## 2019-03-22 ENCOUNTER — Telehealth: Payer: Self-pay | Admitting: Adult Health

## 2019-03-22 NOTE — Telephone Encounter (Signed)
I talk with patient regarding schedule  

## 2019-03-27 ENCOUNTER — Other Ambulatory Visit: Payer: Self-pay

## 2019-03-27 ENCOUNTER — Telehealth: Payer: Self-pay

## 2019-03-27 DIAGNOSIS — E118 Type 2 diabetes mellitus with unspecified complications: Secondary | ICD-10-CM

## 2019-03-27 MED ORDER — JANUMET XR 50-1000 MG PO TB24: 1.0000 | ORAL_TABLET | Freq: Every day | ORAL | 3 refills | Status: DC

## 2019-03-27 NOTE — Telephone Encounter (Signed)
Called pt and informed her of Dr. Ellison's response. Verbalized acceptance and understanding. 

## 2019-03-27 NOTE — Telephone Encounter (Signed)
Patient called in stating that she has been taking 2 pills of her Janumet and it has been making her not feel good and queasey she wants to lower dosage.     Please call and advise

## 2019-03-27 NOTE — Telephone Encounter (Signed)
Please advise 

## 2019-03-27 NOTE — Telephone Encounter (Signed)
Ok

## 2019-03-28 LAB — HM DIABETES EYE EXAM

## 2019-04-02 DIAGNOSIS — H35033 Hypertensive retinopathy, bilateral: Secondary | ICD-10-CM | POA: Diagnosis not present

## 2019-04-02 DIAGNOSIS — H43813 Vitreous degeneration, bilateral: Secondary | ICD-10-CM | POA: Diagnosis not present

## 2019-04-02 DIAGNOSIS — H2513 Age-related nuclear cataract, bilateral: Secondary | ICD-10-CM | POA: Diagnosis not present

## 2019-04-02 DIAGNOSIS — E119 Type 2 diabetes mellitus without complications: Secondary | ICD-10-CM | POA: Diagnosis not present

## 2019-04-06 ENCOUNTER — Other Ambulatory Visit: Payer: Self-pay

## 2019-04-09 ENCOUNTER — Ambulatory Visit (INDEPENDENT_AMBULATORY_CARE_PROVIDER_SITE_OTHER): Payer: Medicare Other

## 2019-04-09 ENCOUNTER — Other Ambulatory Visit: Payer: Self-pay

## 2019-04-09 DIAGNOSIS — M810 Age-related osteoporosis without current pathological fracture: Secondary | ICD-10-CM | POA: Diagnosis not present

## 2019-04-09 MED ORDER — DENOSUMAB 60 MG/ML ~~LOC~~ SOSY
60.0000 mg | PREFILLED_SYRINGE | Freq: Once | SUBCUTANEOUS | Status: AC
  Administered 2019-04-09: 60 mg via SUBCUTANEOUS

## 2019-04-09 NOTE — Progress Notes (Signed)
I have reviewed and agree.

## 2019-04-10 ENCOUNTER — Ambulatory Visit (INDEPENDENT_AMBULATORY_CARE_PROVIDER_SITE_OTHER): Payer: Medicare Other | Admitting: Endocrinology

## 2019-04-10 ENCOUNTER — Ambulatory Visit (INDEPENDENT_AMBULATORY_CARE_PROVIDER_SITE_OTHER)
Admission: RE | Admit: 2019-04-10 | Discharge: 2019-04-10 | Disposition: A | Discharge status: Home / Self Care | Payer: Medicare Other | Source: Ambulatory Visit

## 2019-04-10 ENCOUNTER — Encounter: Payer: Self-pay | Admitting: Endocrinology

## 2019-04-10 ENCOUNTER — Other Ambulatory Visit: Payer: Medicare Other

## 2019-04-10 VITALS — BP 144/70 | HR 91 | Ht 64.5 in | Wt 202.0 lb

## 2019-04-10 DIAGNOSIS — E118 Type 2 diabetes mellitus with unspecified complications: Secondary | ICD-10-CM

## 2019-04-10 DIAGNOSIS — E1151 Type 2 diabetes mellitus with diabetic peripheral angiopathy without gangrene: Secondary | ICD-10-CM | POA: Diagnosis not present

## 2019-04-10 DIAGNOSIS — I10 Essential (primary) hypertension: Secondary | ICD-10-CM

## 2019-04-10 DIAGNOSIS — E2839 Other primary ovarian failure: Secondary | ICD-10-CM

## 2019-04-10 LAB — POCT GLYCOSYLATED HEMOGLOBIN (HGB A1C): Hemoglobin A1C: 6 % — AB (ref 4.0–5.6)

## 2019-04-10 NOTE — Progress Notes (Signed)
Subjective:    Patient ID: Victoria [name], female    DOB: [birth year], 73 y.o.   MRN: [id number]  HPI Pt returns for f/u of diabetes mellitus: DM type: 2 Dx'ed: 8003 Complications: CAD and PAD Therapy: Janumet GDM: never DKA: never Severe hypoglycemia: never Pancreatitis: never Pancreatic imaging: normal on 2008 CT.   Other: edema limits rx options; fructosamine showed better glycemic control than A1c.   Interval history: no recent steroids; She says cbg's are well-controlled.   Pt also has hyperthyroidism (dx'ed 2019; US showed MNG; bx of RLL and RML nodules in 2019 showed beth cat 2; she had RAI 11/20).  pt states she feels well in general.   Past Medical History:  Diagnosis Date  . Abdominal aneurysm ([place])    Dr. Oneida Alar follows lLOV 2 ''17 per pt "around 2 cm"  . Anemia    as a child  . Arthritis    left ankle, right knee, right SI joint, wrists, lower back  . Breast cancer in female [facility])    Left  . COPD (chronic obstructive pulmonary disease) ([place])    ephysema-Dr. Chase Caller  . Dyspnea   . Headache    as a child would have terrible headaches during season changes  . 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 Hyperlipidemia   . Hypertension   . Multiple thyroid nodules   . Murmur, cardiac 1950  . Osteoporosis   . Pneumonia   . Pre-diabetes   . Requires continuous at home supplemental oxygen    2 L 24/7  . Sebaceous cyst    hairline sebaceous cyst left posterior neck to be excised 04-13-16 by Dr. Harlow Asa in [place] [facility]  . Sleep apnea    cpap used sometimes, uses oxygen concentrator 2 l/m nasally bedtime  . Varicella as child    Past Surgical History:  Procedure Laterality Date  . APPENDECTOMY     2008  . BREAST LUMPECTOMY Left 08/31/2017  . BREAST LUMPECTOMY WITH RADIOACTIVE SEED LOCALIZATION Left 08/10/2017   Procedure: BREAST LUMPECTOMY WITH RADIOACTIVE SEED LOCALIZATION;  Surgeon: Rolm Bookbinder, MD;  Location: [place];  Service: General;   Laterality: Left;  . [place]  . COLONOSCOPY    . COLONOSCOPY WITH PROPOFOL N/A 04/16/2016   Procedure: COLONOSCOPY WITH PROPOFOL;  Surgeon: Carol Ada, MD;  Location: [facility];  Service: Endoscopy;  Laterality: N/A;  . CYST REMOVAL NECK Left 04/13/2016   Procedure: EXCISION OF SEBACEOUS CYST LEFT POSTERIOR NECK;  Surgeon: Armandina Gemma, MD;  Location: [place];  Service: General;  Laterality: Left;  . Excision of Pelvic Absess, Right Ovary     2008  . RE-EXCISION OF BREAST CANCER,SUPERIOR MARGINS Left 08/31/2017   Procedure: RE-EXCISION OF LEFT  BREAST MARGINS ERAS PATHWAY;  Surgeon: Rolm Bookbinder, MD;  Location: [facility];  Service: General;  Laterality: Left;  . TUBAL LIGATION  1980    Social History   Socioeconomic History  . Marital status: Divorced    Spouse name: Not on file  . Number of children: 1  . Years of education: 30  . Highest education level: Not on file  Occupational History  . Occupation: Social worker, Technical sales engineer: [employer]  Social Needs  . Financial resource strain: Not very hard  . Food insecurity    Worry: Never true    Inability: Never true  . Transportation needs    Medical: No    Non-medical:  No  Tobacco Use  . Smoking status: Former Smoker    Packs/day: 1.00    Years: 50.00    Pack years: 50.00    Types: Cigarettes    Quit date: 04/16/2011    Years since quitting: 7.9  . Smokeless tobacco: Never Used  . Tobacco comment: quit that date when she had to go to ER   Substance and Sexual Activity  . Alcohol use: Yes    Alcohol/week: 0.0 standard drinks    Comment: rare occasion  . Drug use: No    Comment: no marijuana since 2012  . Sexual activity: Not Currently  Lifestyle  . Physical activity    Days per week: 0 days    Minutes per session: 0 min  . Stress: Only a little  Relationships  . Social connections    Talks on phone: More than three times a week    Gets together: More than three times a week     Attends religious service: More than 4 times per year    Active member of club or organization: Yes    Attends meetings of clubs or organizations: More than 4 times per year    Relationship status: Divorced  . Intimate partner violence    Fear of current or ex partner: Not on file    Emotionally abused: Not on file    Physically abused: Not on file    Forced sexual activity: Not on file  Other Topics Concern  . Not on file  Social History Narrative   HSG; [name], [place]. . Married '69 - 9 yrs/divorced. 1 son - '72; no grandchildren.   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 No h/o physical or sexual abuse. ACP - no living will - wants information. Provided packet of information. On 08/05/2011: she stated she was distant cousins to celebrities [name] and [name]       Current Outpatient Medications on File Prior to Visit  Medication Sig Dispense Refill  . Accu-Chek FastClix Lancets MISC 1 each by Does not apply route daily. Use to monitor glucose levels once per day; E11.9 102 each 2  . acetaminophen (TYLENOL) 500 MG tablet Take 1,000 mg by mouth every 6 (six) hours as needed.    . Albuterol Sulfate (PROAIR RESPICLICK) 401 (90 Base) MCG/ACT AEPB Inhale 1-2 puffs into the lungs every 6 (six) hours as needed (for wheezing/shortness of breath). 1 each 0  . amLODipine (NORVASC) 10 MG tablet Take 1 tablet by mouth once daily 90 tablet 0  . anastrozole (ARIMIDEX) 1 MG tablet Take 1 tablet (1 mg total) by mouth daily. 90 tablet 4  . atorvastatin (LIPITOR) 80 MG tablet Take 1 tablet (80 mg total) by mouth daily at 6 PM. 90 tablet 3  . Blood Glucose Monitoring Suppl (ACCU-CHEK AVIVA PLUS) w/Device KIT 1 Device by Does not apply route daily. Use to monitor glucose levels once per day; E11.9 1 kit 0  . Cholecalciferol (VITAMIN D) 125 MCG (5000 UT) CAPS Take 1 capsule by mouth daily.    [name] denosumab (PROLIA) 60 MG/ML SOLN injection  Inject 60 mg into the skin every 6 (six) months. Administer in upper arm, thigh, or abdomen    . glucose blood (ACCU-CHEK AVIVA) test strip 1 each by Other route daily. Use to monitor glucose levels once per day; E11.9 100 each 2  . OXYGEN Inhale 2-3 L into the lungs continuous. When exerting self     .  potassium chloride SA (K-DUR,KLOR-CON) 20 MEQ tablet Take 1 tablet (20 mEq total) by mouth 2 (two) times daily. 180 tablet 1  . roflumilast (DALIRESP) 500 MCG TABS tablet Take 1 tablet (500 mcg total) by mouth daily. 30 tablet 11  . SitaGLIPtin-MetFORMIN HCl (JANUMET XR) 50-1000 MG TB24 Take 1 tablet by mouth daily. 180 tablet 3  . sodium chloride (OCEAN) 0.65 % SOLN nasal spray Place 1 spray into both nostrils as needed (dryness).    . TRELEGY ELLIPTA 100-62.5-25 MCG/INH AEPB INHALE 1 DOSE INTO THE LUNGS DAILY. 84 each 1  . chlorthalidone (HYGROTON) 25 MG tablet Take 1 tablet (25 mg total) by mouth daily. 90 tablet 3   No current facility-administered medications on file prior to visit.     Allergies  Allergen Reactions  . Benicar [Olmesartan] Swelling    Swelling of face and arms   . [name] [Valsartan] Swelling    Swelling of face and arms   . Hydrocodone-Acetaminophen Nausea And Vomiting    Severe vomiting  . Lisinopril Cough  . Monosodium Glutamate Other (See Comments)    Facial swelling per pt  . Codeine Other (See Comments)    jittery  . Lead Acetate Rash  . Nickel Rash    Severe rash to infection: pt is allergic to all metals other than sterling silver or gold jewelry.     Family History  Problem Relation Age of Onset  . Heart disease Mother        MI - fatal  . Hypertension Mother   . Stroke Father 91       fatal  . Alzheimer's disease Father   . Alzheimer's disease Brother   . Hyperlipidemia Brother   . Hypertension Brother   . Diabetes Brother   . Hypertension Brother   . Hyperlipidemia Brother   . Breast cancer Paternal Grandmother     BP (!) 144/70 (BP  Location: Right Arm, Patient Position: Sitting, Cuff Size: Large)   Pulse 91   Ht 5' 4.5" (1.638 m)   Wt 202 lb (91.6 kg)   SpO2 98%   BMI 34.14 kg/m    Review of Systems She has lost weight, due to her efforts.     Objective:   Physical Exam VITAL SIGNS:  See vs page GENERAL: no distress.  Has 02 on.   NECK: Thyroid is slightly enlarged, with multiple palpable nodules. No palpable lymphadenopathy at the anterior neck.   Pulses: dorsalis pedis intact bilat.   MSK: no deformity of the feet CV: no leg edema Skin:  no ulcer on the feet.  normal color and temp on the feet.   Neuro: sensation is intact to touch on the feet.    Lab Results  Component Value Date   HGBA1C 6.0 (A) 04/10/2019      Assessment & Plan:  Type 2 DM, with PAD: well-controlled Edema, resolved.  Still, This limits rx options HTN: is noted today  Patient Instructions  Your blood pressure is high today.  Please see your primary care provider soon, to have it rechecked Please continue the same medication. Please come back for a follow-up appointment in 4 weeks.

## 2019-04-10 NOTE — Patient Instructions (Addendum)
Your blood pressure is high today.  Please see your primary care provider soon, to have it rechecked Please continue the same medication. Please come back for a follow-up appointment in 4 weeks.

## 2019-04-13 ENCOUNTER — Telehealth: Payer: Self-pay

## 2019-04-13 NOTE — Telephone Encounter (Signed)
-----   Message from Gardenia Phlegm, NP sent at 04/13/2019  7:53 AM EST ----- Bone density improved.  Continue current treatment.  [place] ----- Message ----- From: Cloyd Stagers, MD Sent: 04/10/2019   4:31 PM EST To: Gardenia Phlegm, NP

## 2019-04-13 NOTE — Telephone Encounter (Signed)
Spoke with patient to inform of bone density results.  Patient voiced understanding and thanks for call.

## 2019-04-14 DIAGNOSIS — J449 Chronic obstructive pulmonary disease, unspecified: Secondary | ICD-10-CM | POA: Diagnosis not present

## 2019-04-16 ENCOUNTER — Other Ambulatory Visit: Payer: Self-pay | Admitting: Internal Medicine

## 2019-04-16 DIAGNOSIS — I10 Essential (primary) hypertension: Secondary | ICD-10-CM

## 2019-04-16 IMAGING — CT CT CHEST LUNG CANCER SCREENING LOW DOSE W/O CM
2 of 4 series · 15 of 40 positions shown, 18 images · non-contrast
Comparison: Low-dose lung cancer screening CT chest dated
10/09/2015

CLINICAL DATA: 70-year-old female former smoker, quit 6 years ago,
with 50 pack-year history of smoking, for follow-up lung cancer
screening

EXAM:
CT CHEST WITHOUT CONTRAST LOW-DOSE FOR LUNG CANCER SCREENING
TECHNIQUE: Multidetector CT imaging of the chest was performed following the
standard protocol without IV contrast.

[Series 2: thorax 5.0 i31f 3 · axial · 0.76mm/px · z∈[-271,-41]mm · 12 of 56 slices shown, 15 images]
[im 5/56  mediastinal]
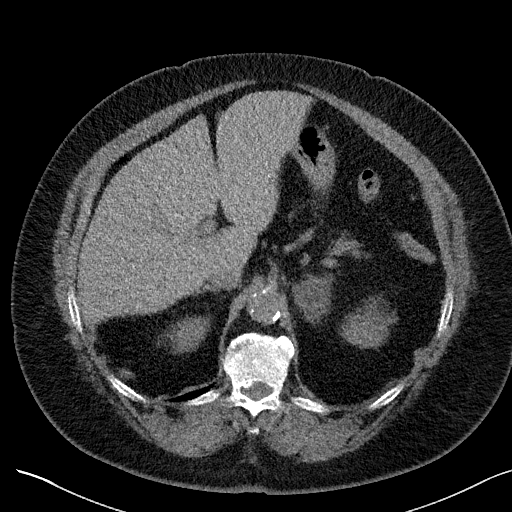
[im 5/56  lung]
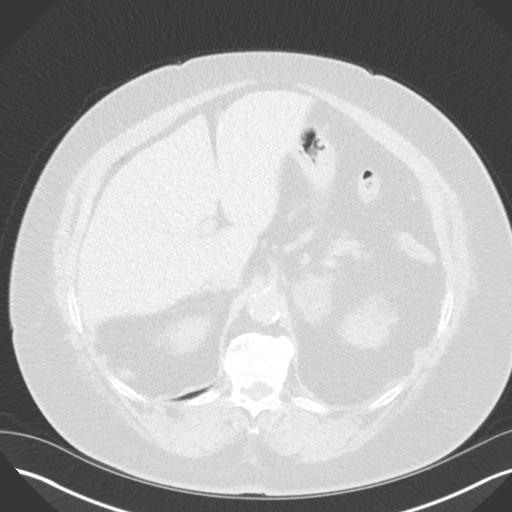
[im 9/56  lung]
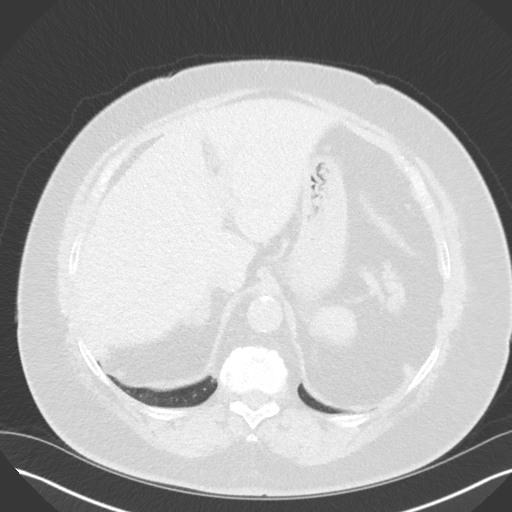
[im 13/56  lung]
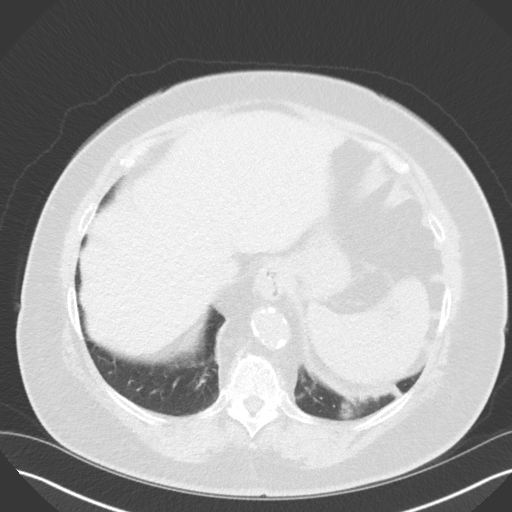
[im 17/56  lung]
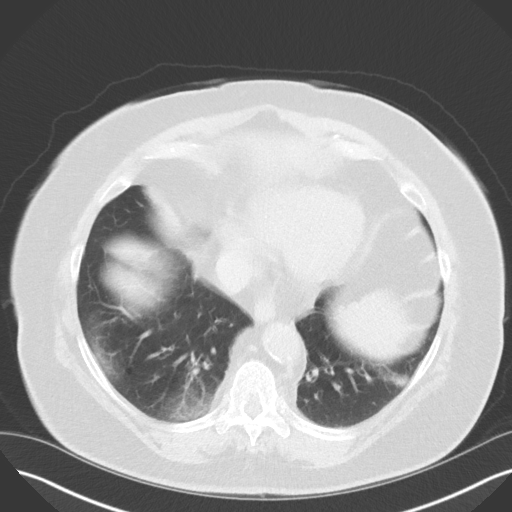
[im 22/56  mediastinal]
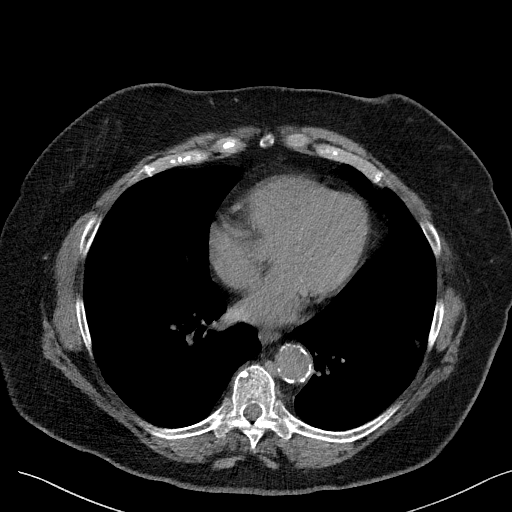
[im 22/56  lung]
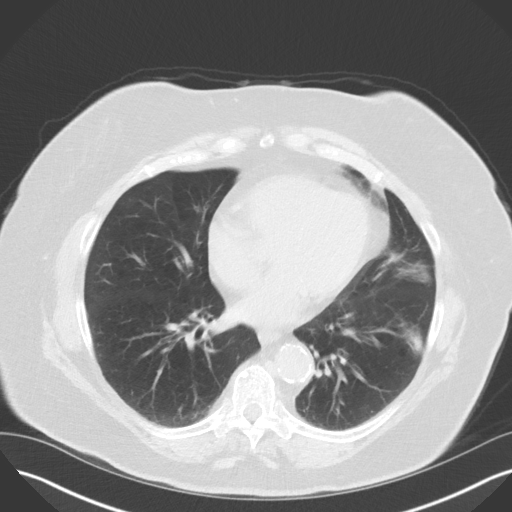
[im 26/56  lung]
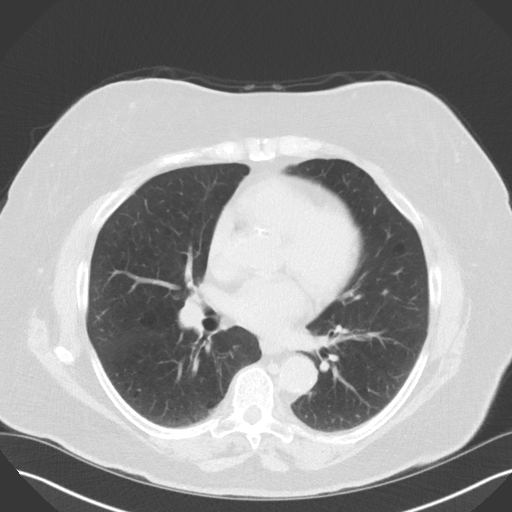
[im 30/56  lung]
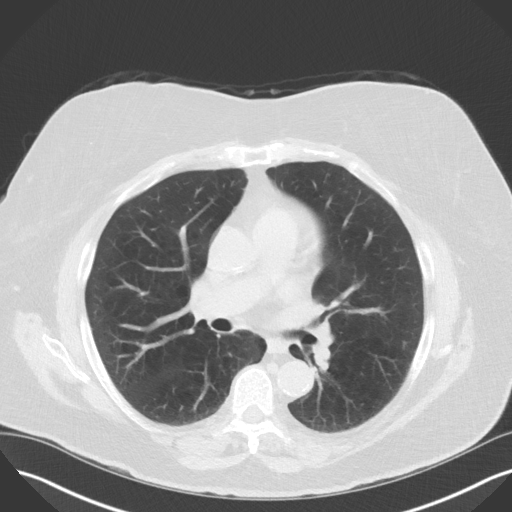
[im 34/56  lung]
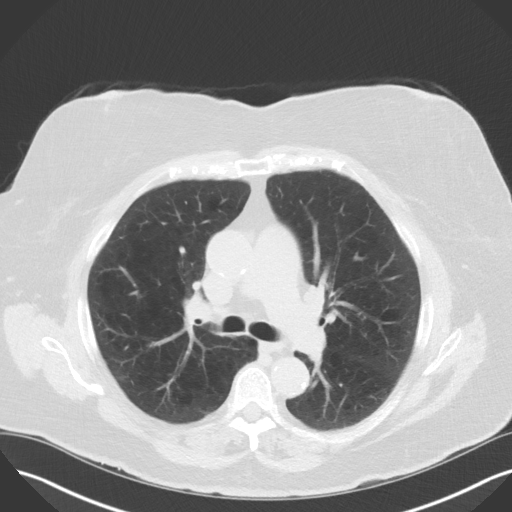
[im 39/56  mediastinal]
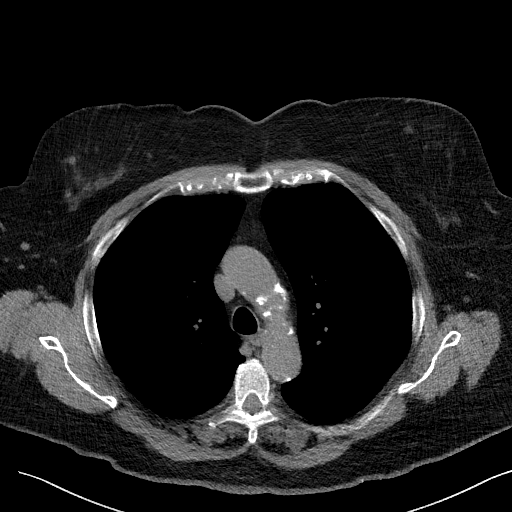
[im 39/56  lung]
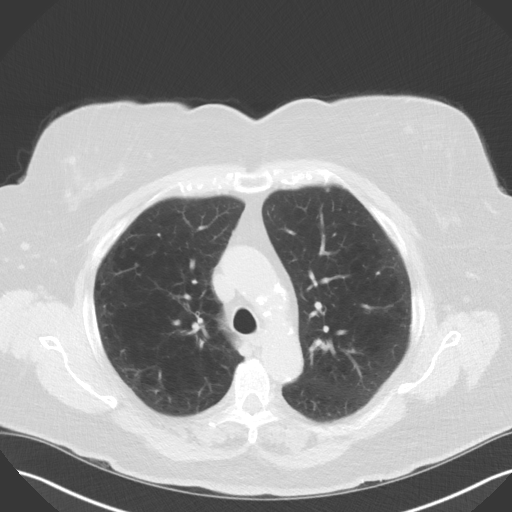
[im 43/56  lung]
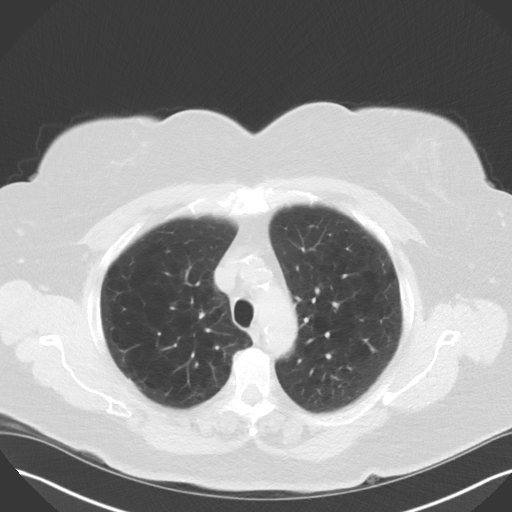
[im 47/56  lung]
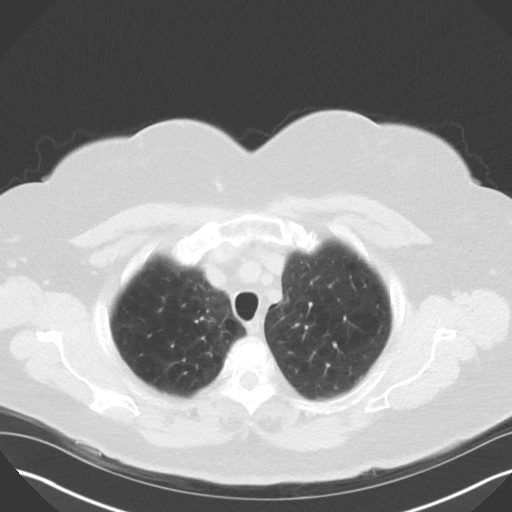
[im 51/56  lung]
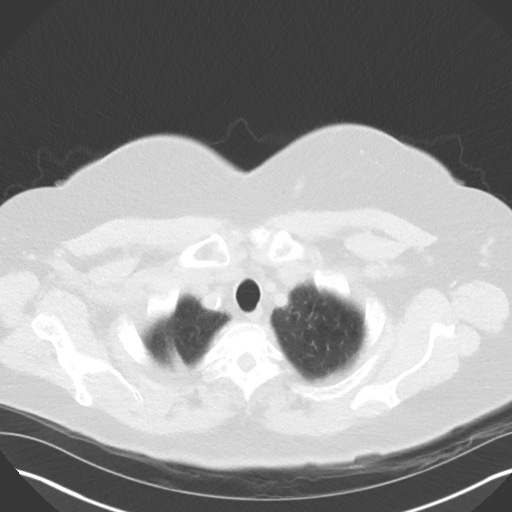

[Series 5: coronal · coronal · 0.59mm/px · 3 of 151 slices shown]
[im 31/151  lung]
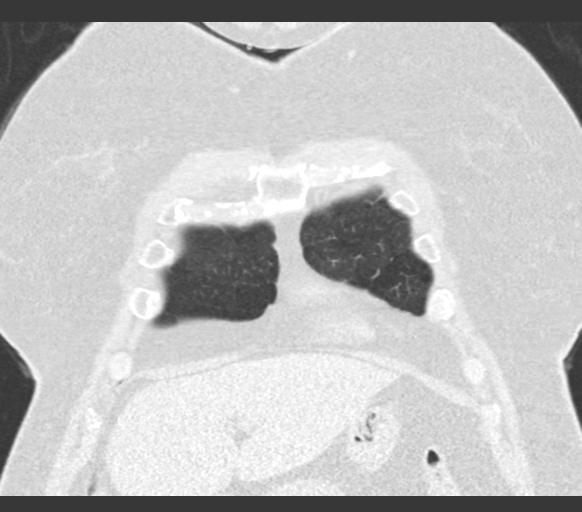
[im 61/151  lung]
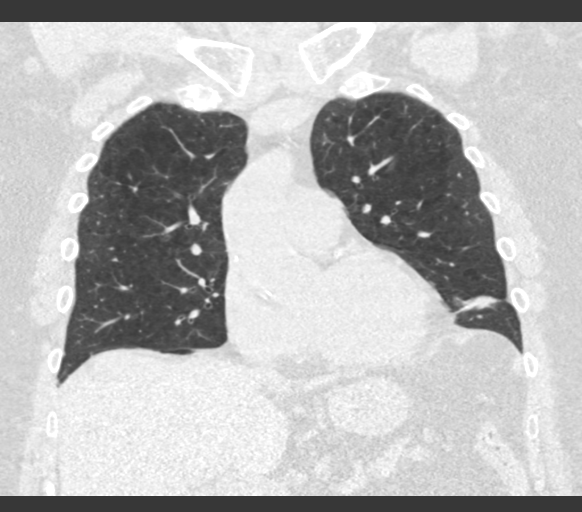
[im 91/151  lung]
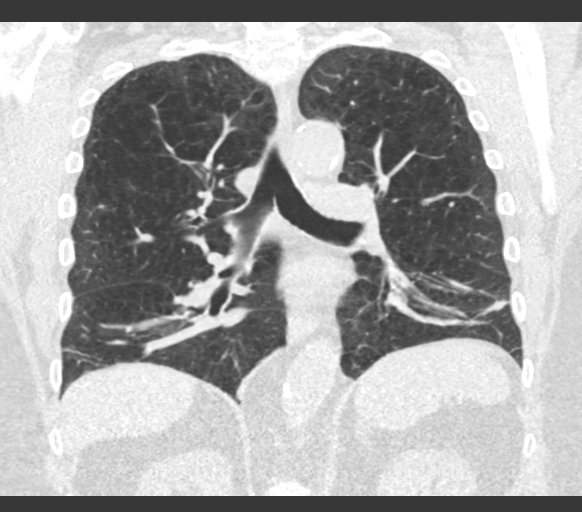

[15 of 40 positions shown; findings below may reference images not displayed]

FINDINGS: Cardiovascular: Heart is normal in size.  No pericardial effusion.

Coronary atherosclerosis the LAD and right coronary artery.

No evidence of thoracic aortic aneurysm. Atherosclerotic
calcifications aortic arch.

Mediastinum/Nodes: No suspicious mediastinal lymphadenopathy.

Visualized thyroid is unremarkable.

Lungs/Pleura: Scattered bilateral pulmonary nodules, including a
dominant 5.1 mm subpleural nodule along the left fissure (series 3/
image 121). Some of these nodules are more conspicuous than when
compared to the prior. However, there are no new nodules.

Moderate centrilobular emphysematous changes, upper lobe
predominant.

Scarring/ atelectasis in the lingula, right middle lobe, and
bilateral lower lobes.

No focal consolidation.

No pleural effusion or pneumothorax.

Upper Abdomen: Visualized upper abdomen is notable for a 3.6 cm
benign left adrenal adenoma.

Musculoskeletal: Mild degenerative changes of the visualized
thoracolumbar spine.
IMPRESSION: Lung-RADS 2, benign appearance or behavior. Continue annual
screening with low-dose chest CT without contrast in 12 months.

## 2019-04-19 ENCOUNTER — Encounter: Payer: Self-pay | Admitting: Internal Medicine

## 2019-04-19 ENCOUNTER — Other Ambulatory Visit (INDEPENDENT_AMBULATORY_CARE_PROVIDER_SITE_OTHER): Payer: Medicare Other

## 2019-04-19 ENCOUNTER — Ambulatory Visit (INDEPENDENT_AMBULATORY_CARE_PROVIDER_SITE_OTHER): Payer: Medicare Other | Admitting: Internal Medicine

## 2019-04-19 ENCOUNTER — Other Ambulatory Visit: Payer: Self-pay

## 2019-04-19 VITALS — BP 138/60 | HR 97 | Temp 98.7°F | Resp 20 | Ht 64.5 in | Wt 204.0 lb

## 2019-04-19 DIAGNOSIS — D539 Nutritional anemia, unspecified: Secondary | ICD-10-CM

## 2019-04-19 DIAGNOSIS — E0590 Thyrotoxicosis, unspecified without thyrotoxic crisis or storm: Secondary | ICD-10-CM

## 2019-04-19 DIAGNOSIS — Z23 Encounter for immunization: Secondary | ICD-10-CM

## 2019-04-19 DIAGNOSIS — I10 Essential (primary) hypertension: Secondary | ICD-10-CM

## 2019-04-19 DIAGNOSIS — T502X5A Adverse effect of carbonic-anhydrase inhibitors, benzothiadiazides and other diuretics, initial encounter: Secondary | ICD-10-CM

## 2019-04-19 DIAGNOSIS — E876 Hypokalemia: Secondary | ICD-10-CM

## 2019-04-19 DIAGNOSIS — E118 Type 2 diabetes mellitus with unspecified complications: Secondary | ICD-10-CM | POA: Diagnosis not present

## 2019-04-19 LAB — BASIC METABOLIC PANEL
BUN: 16 mg/dL (ref 6–23)
CO2: 31 mEq/L (ref 19–32)
Calcium: 9.6 mg/dL (ref 8.4–10.5)
Chloride: 95 mEq/L — ABNORMAL LOW (ref 96–112)
Creatinine, Ser: 0.71 mg/dL (ref 0.40–1.20)
GFR: 97.05 mL/min (ref >60.00)
Glucose, Bld: 136 mg/dL — ABNORMAL HIGH (ref 70–99)
Potassium: 3.3 mEq/L — ABNORMAL LOW (ref 3.5–5.1)
Sodium: 136 mEq/L (ref 135–145)

## 2019-04-19 LAB — CBC WITH DIFFERENTIAL/PLATELET
Basophils Absolute: 0.1 10*3/uL (ref 0.0–0.1)
Basophils Relative: 0.6 % (ref 0.0–3.0)
Eosinophils Absolute: 0.1 10*3/uL (ref 0.0–0.7)
Eosinophils Relative: 1.2 % (ref 0.0–5.0)
HCT: 35.7 % — ABNORMAL LOW (ref 36.0–46.0)
Hemoglobin: 12.2 g/dL (ref 12.0–15.0)
Lymphocytes Relative: 16.6 % (ref 12.0–46.0)
Lymphs Abs: 1.7 10*3/uL (ref 0.7–4.0)
MCHC: 34.1 g/dL (ref 30.0–36.0)
MCV: 83.6 fl (ref 78.0–100.0)
Monocytes Absolute: 0.6 10*3/uL (ref 0.1–1.0)
Monocytes Relative: 6 % (ref 3.0–12.0)
Neutro Abs: 7.6 10*3/uL (ref 1.4–7.7)
Neutrophils Relative %: 75.6 % (ref 43.0–77.0)
Platelets: 255 10*3/uL (ref 150.0–400.0)
RBC: 4.27 Mil/uL (ref 3.87–5.11)
RDW: 14.6 % (ref 11.5–15.5)
WBC: 10.1 10*3/uL (ref 4.0–10.5)

## 2019-04-19 LAB — URINALYSIS, ROUTINE W REFLEX MICROSCOPIC
Bilirubin Urine: NEGATIVE
Ketones, ur: NEGATIVE
Nitrite: NEGATIVE
Specific Gravity, Urine: 1.015 (ref 1.000–1.030)
Total Protein, Urine: NEGATIVE
Urine Glucose: NEGATIVE
Urobilinogen, UA: 0.2 (ref 0.0–1.0)
pH: 6.5 (ref 5.0–8.0)

## 2019-04-19 LAB — FERRITIN: Ferritin: 84.2 ng/mL (ref 10.0–291.0)

## 2019-04-19 LAB — MAGNESIUM: Magnesium: 1.7 mg/dL (ref 1.5–2.5)

## 2019-04-19 LAB — MICROALBUMIN / CREATININE URINE RATIO
Creatinine,U: 80.4 mg/dL
Microalb Creat Ratio: 1.7 mg/g (ref 0.0–30.0)
Microalb, Ur: 1.4 mg/dL (ref 0.0–1.9)

## 2019-04-19 LAB — IBC PANEL
Iron: 59 ug/dL (ref 42–145)
Saturation Ratios: 16.4 % — ABNORMAL LOW (ref 20.0–50.0)
Transferrin: 257 mg/dL (ref 212.0–360.0)

## 2019-04-19 LAB — FOLATE: Folate: 11.6 ng/mL (ref >5.9)

## 2019-04-19 LAB — VITAMIN B12: Vitamin B-12: 229 pg/mL (ref 211–911)

## 2019-04-19 MED ORDER — SPIRONOLACTONE 25 MG PO TABS: 25.0000 mg | ORAL_TABLET | Freq: Every day | ORAL | 1 refills | Status: DC

## 2019-04-19 MED ORDER — SHINGRIX 50 MCG/0.5ML IM SUSR: 0.5000 mL | Freq: Once | INTRAMUSCULAR | 1 refills | Status: AC

## 2019-04-19 MED ORDER — AMLODIPINE BESYLATE 10 MG PO TABS: 10.0000 mg | ORAL_TABLET | Freq: Every day | ORAL | 1 refills | Status: DC

## 2019-04-19 NOTE — Patient Instructions (Signed)
Anemia  Anemia is a condition in which you do not have enough red blood cells or hemoglobin. Hemoglobin is a substance in red blood cells that carries oxygen. When you do not have enough red blood cells or hemoglobin (are anemic), your body cannot get enough oxygen and your organs may not work properly. As a result, you may feel very tired or have other problems. What are the causes? Common causes of anemia include:  Excessive bleeding. Anemia can be caused by excessive bleeding inside or outside the body, including bleeding from the intestine or from periods in women.  Poor nutrition.  Long-lasting (chronic) kidney, thyroid, and liver disease.  Bone marrow disorders.  Cancer and treatments for cancer.  HIV (human immunodeficiency virus) and AIDS (acquired immunodeficiency syndrome).  Treatments for HIV and AIDS.  Spleen problems.  Blood disorders.  Infections, medicines, and autoimmune disorders that destroy red blood cells. What are the signs or symptoms? Symptoms of this condition include:  Minor weakness.  Dizziness.  Headache.  Feeling heartbeats that are irregular or faster than normal (palpitations).  Shortness of breath, especially with exercise.  Paleness.  Cold sensitivity.  Indigestion.  Nausea.  Difficulty sleeping.  Difficulty concentrating. Symptoms may occur suddenly or develop slowly. If your anemia is mild, you may not have symptoms. How is this diagnosed? This condition is diagnosed based on:  Blood tests.  Your medical history.  A physical exam.  Bone marrow biopsy. Your health care provider may also check your stool (feces) for blood and may do additional testing to look for the cause of your bleeding. You may also have other tests, including:  Imaging tests, such as a CT scan or MRI.  Endoscopy.  Colonoscopy. How is this treated? Treatment for this condition depends on the cause. If you continue to lose a lot of blood, you may  need to be treated at a hospital. Treatment may include:  Taking supplements of iron, vitamin S31, or folic acid.  Taking a hormone medicine (erythropoietin) that can help to stimulate red blood cell growth.  Having a blood transfusion. This may be needed if you lose a lot of blood.  Making changes to your diet.  Having surgery to remove your spleen. Follow these instructions at home:  Take over-the-counter and prescription medicines only as told by your health care provider.  Take supplements only as told by your health care provider.  Follow any diet instructions that you were given.  Keep all follow-up visits as told by your health care provider. This is important. Contact a health care provider if:  You develop new bleeding anywhere in the body. Get help right away if:  You are very weak.  You are short of breath.  You have pain in your abdomen or chest.  You are dizzy or feel faint.  You have trouble concentrating.  You have bloody or black, tarry stools.  You vomit repeatedly or you vomit up blood. Summary  Anemia is a condition in which you do not have enough red blood cells or enough of a substance in your red blood cells that carries oxygen (hemoglobin).  Symptoms may occur suddenly or develop slowly.  If your anemia is mild, you may not have symptoms.  This condition is diagnosed with blood tests as well as a medical history and physical exam. Other tests may be needed.  Treatment for this condition depends on the cause of the anemia. This information is not intended to replace advice given to you by  your health care provider. Make sure you discuss any questions you have with your health care provider. Document Released: 06/10/2004 Document Revised: 04/15/2017 Document Reviewed: 06/04/2016 Elsevier Patient Education  2020 [employer].

## 2019-04-19 NOTE — Progress Notes (Signed)
Subjective:  Patient ID: Victoria [name], female    DOB: [birth year]  Age: 73 y.o. MRN: [id number]  CC: Hypertension, Diabetes, and Anemia  This visit occurred during the SARS-CoV-2 public health emergency.  Safety protocols were in place, including screening questions prior to the visit, additional usage of staff PPE, and extensive cleaning of exam room while observing appropriate contact time as indicated for disinfecting solutions.    HPI Victoria [name] presents for f/up -she has intentionally lost weight over the last few months.  She tells me her blood pressure has been well controlled.  She has her baseline, nonworsening, level of shortness of breath.  She denies chest pain, dizziness, lightheadedness, diaphoresis, or edema.  Her recent lab work showed that she had a mild, normochromic, normocytic anemia.  Outpatient Medications Prior to Visit  Medication Sig Dispense Refill   Accu-Chek FastClix Lancets MISC 1 each by Does not apply route daily. Use to monitor glucose levels once per day; E11.9 102 each 2   acetaminophen (TYLENOL) 500 MG tablet Take 1,000 mg by mouth every 6 (six) hours as needed.     Albuterol Sulfate (PROAIR RESPICLICK) 287 (90 Base) MCG/ACT AEPB Inhale 1-2 puffs into the lungs every 6 (six) hours as needed (for wheezing/shortness of breath). 1 each 0   anastrozole (ARIMIDEX) 1 MG tablet Take 1 tablet (1 mg total) by mouth daily. 90 tablet 4   atorvastatin (LIPITOR) 80 MG tablet Take 1 tablet (80 mg total) by mouth daily at 6 PM. 90 tablet 3   Blood Glucose Monitoring Suppl (ACCU-CHEK AVIVA PLUS) w/Device KIT 1 Device by Does not apply route daily. Use to monitor glucose levels once per day; E11.9 1 kit 0   Cholecalciferol (VITAMIN D) 125 MCG (5000 UT) CAPS Take 1 capsule by mouth daily.     denosumab (PROLIA) 60 MG/ML SOLN injection Inject 60 mg into the skin every 6 (six) months. Administer in upper arm, thigh, or abdomen     glucose blood  (ACCU-CHEK AVIVA) test strip 1 each by Other route daily. Use to monitor glucose levels once per day; E11.9 100 each 2   OXYGEN Inhale 2-3 L into the lungs continuous. When exerting self      potassium chloride SA (K-DUR,KLOR-CON) 20 MEQ tablet Take 1 tablet (20 mEq total) by mouth 2 (two) times daily. 180 tablet 1   roflumilast (DALIRESP) 500 MCG TABS tablet Take 1 tablet (500 mcg total) by mouth daily. 30 tablet 11   SitaGLIPtin-MetFORMIN HCl (JANUMET XR) 50-1000 MG TB24 Take 1 tablet by mouth daily. 180 tablet 3   TRELEGY ELLIPTA 100-62.5-25 MCG/INH AEPB INHALE 1 DOSE INTO THE LUNGS DAILY. 84 each 1   amLODipine (NORVASC) 10 MG tablet Take 1 tablet by mouth once daily 90 tablet 0   sodium chloride (OCEAN) 0.65 % SOLN nasal spray Place 1 spray into both nostrils as needed (dryness).     chlorthalidone (HYGROTON) 25 MG tablet Take 1 tablet (25 mg total) by mouth daily. 90 tablet 3   No facility-administered medications prior to visit.     ROS Review of Systems  Constitutional: Negative.  Negative for appetite change, diaphoresis, fatigue and unexpected weight change.  HENT: Negative.  Negative for trouble swallowing.   Eyes: Negative for visual disturbance.  Respiratory: Positive for shortness of breath. Negative for cough, chest tightness and wheezing.   Cardiovascular: Negative for chest pain, palpitations and leg swelling.  Gastrointestinal: Negative for abdominal pain, blood in stool, constipation, diarrhea,  nausea and vomiting.  Endocrine: Negative.  Negative for cold intolerance and heat intolerance.  Genitourinary: Negative.  Negative for difficulty urinating.  Musculoskeletal: Negative.  Negative for arthralgias.  Skin: Negative.  Negative for color change and pallor.  Neurological: Negative.  Negative for dizziness, weakness, numbness and headaches.  Hematological: Negative for adenopathy. Does not bruise/bleed easily.  Psychiatric/Behavioral: Negative.     Objective:    BP 138/60 (BP Location: Left Arm, Patient Position: Sitting, Cuff Size: Normal)    Pulse 97    Temp 98.7 F (37.1 C) (Oral)    Resp 20    Ht 5' 4.5" (1.638 m)    Wt 204 lb (92.5 kg)    SpO2 97%    BMI 34.48 kg/m   BP Readings from Last 3 Encounters:  04/19/19 138/60  04/10/19 (!) 144/70  03/21/19 (!) 157/71    Wt Readings from Last 3 Encounters:  04/19/19 204 lb (92.5 kg)  04/10/19 202 lb (91.6 kg)  03/21/19 204 lb 6.4 oz (92.7 kg)    Physical Exam Vitals signs reviewed. Nursing note reviewed: O2 dependent.  Constitutional:      General: She is not in acute distress.    Appearance: She is obese. She is ill-appearing. She is not toxic-appearing or diaphoretic.  HENT:     Nose: Nose normal.     Mouth/Throat:     Mouth: Mucous membranes are moist.  Eyes:     General: No scleral icterus. Neck:     Musculoskeletal: Neck supple.  Cardiovascular:     Rate and Rhythm: Normal rate and regular rhythm.     Heart sounds: No murmur.  Pulmonary:     Effort: Pulmonary effort is normal. Tachypnea present. No respiratory distress.     Breath sounds: Normal breath sounds. No decreased breath sounds, wheezing, rhonchi or rales.  Abdominal:     General: Abdomen is protuberant. There is no distension.     Palpations: There is no mass.     Tenderness: There is no abdominal tenderness.  Musculoskeletal: Normal range of motion.        General: No swelling.     Right lower leg: No edema.     Left lower leg: No edema.  Lymphadenopathy:     Cervical: No cervical adenopathy.  Skin:    General: Skin is warm.     Coloration: Skin is not pale.  Neurological:     General: No focal deficit present.     Mental Status: She is alert.     Lab Results  Component Value Date   WBC 10.1 04/19/2019   HGB 12.2 04/19/2019   HCT 35.7 (L) 04/19/2019   PLT 255.0 04/19/2019   GLUCOSE 136 (H) 04/19/2019   CHOL 149 01/08/2019   TRIG 148 01/08/2019   HDL 53 01/08/2019   LDLDIRECT 121.0 09/05/2014    LDLCALC 66 01/08/2019   ALT 8 03/21/2019   AST 13 (L) 03/21/2019   NA 136 04/19/2019   K 3.3 (L) 04/19/2019   CL 95 (L) 04/19/2019   CREATININE 0.71 04/19/2019   BUN 16 04/19/2019   CO2 31 04/19/2019   TSH 0.26 (L) 04/19/2019   INR 1.01 05/28/2014   HGBA1C 6.0 (A) 04/10/2019   MICROALBUR 1.4 04/19/2019    Dg Bone Density  Result Date: 04/10/2019 Date of study: 04/10/2019 Exam: DUAL X-RAY ABSORPTIOMETRY (DXA) FOR BONE MINERAL DENSITY (BMD) Instrument: [employer] Requesting Provider: PCP Indication: follow up for low BMD Comparison: none (please note that  it is not possible to compare data from different instruments) Clinical data: Pt is a 73 y.o. female on Anastrozole and  Prolia Results:  Lumbar spine L1-L3 Femoral neck (FN) T-score -1.5 RFN: -1.2 LFN: -2.2 Change in BMD from previous DXA test (%) Up 6.5* Up 4.3 (*) statistically significant Assessment: By the [facility] Criteria for diagnosis based on bone density, this patient has Low Bone Density Z Score compares the patients bone density to age, sex, and race matched controls.  Compared to age, sex, and race matched controls, this patient's bone density is Average  FRAX 10-year fracture risk calculator: Not calculated- Pt on Prolia  L4 vertebra had to be excluded from analysis due to DJD Comments: the technical quality of the study is good. WHO criteria for diagnosis of osteoporosis in postmenopausal women and in men 24 y/o or older: - normal: T-score -1.0 to + 1.0 - osteopenia/low bone density: T-score between -2.5 and -1.0 - osteoporosis: T-score below -2.5 - severe osteoporosis: T-score below -2.5 with history of fragility fracture Note: although not part of the WHO classification, the presence of a fragility fracture, regardless of the T-score, should be considered diagnostic of osteoporosis, provided other causes for the fracture have been excluded. RECOMMENDATIONS:  Recommend optimizing calcium (1200 mg/day) and vitamin D (800  IU/day) intake  Continue current treatment  Follow up BMD is recommended: 2 years. Interpreted by : Mack Guise, MD [facility]    Assessment & Plan:   Victoria [name] was seen today for hypertension, diabetes and anemia.  Diagnoses and all orders for this visit:  Essential hypertension- Her blood pressure is well controlled but she has developed hypokalemia and hypochloridemia on chlorthalidone.  I have asked her to stop taking chlorthalidone and to transition to spironolactone. -     Basic metabolic panel; Future -     Magnesium; Future -     amLODipine (NORVASC) 10 MG tablet; Take 1 tablet (10 mg total) by mouth daily. -     Urinalysis, Routine w reflex microscopic; Future -     spironolactone (ALDACTONE) 25 MG tablet; Take 1 tablet (25 mg total) by mouth daily.  Type II diabetes mellitus with manifestations ([place])- Her recent A1c was 6.0%.  Her blood sugars are adequately well controlled. -     Basic metabolic panel; Future -     Microalbumin / creatinine urine ratio; Future -     Urinalysis, Routine w reflex microscopic; Future  Hypokalemia- I recommended that she stop taking chlorthalidone and change to spironolactone.  This will help stabilize her potassium level. -     Basic metabolic panel; Future -     Magnesium; Future  Deficiency anemia- Her H&H have improved since her last testing.  I will screen her for vitamin deficiencies. -     CBC with Differential; Future -     B12; Future -     IBC panel; Future -     Vitamin B1; Future -     Folate; Future -     Ferritin; Future -     Reticulocytes; Future  Hyperthyroidism- Her TSH is mildly suppressed but her other TFTs are normal and clinically she appears euthyroid.  This is consistent with subclinical hyperthyroidism and at this time does not require treatment. -     Thyroid Panel With TSH; Future  Need for shingles vaccine -     Zoster Vaccine Adjuvanted [facility]) injection; Inject 0.5 mLs into the muscle  once for 1 dose.  Diuretic-induced  hypokalemia -     spironolactone (ALDACTONE) 25 MG tablet; Take 1 tablet (25 mg total) by mouth daily.   I have discontinued Victoria [name]'s sodium chloride and chlorthalidone. I have also changed her amLODipine. Additionally, I am having her start on Shingrix and spironolactone. Lastly, I am having her maintain her OXYGEN, denosumab, acetaminophen, Albuterol Sulfate, potassium chloride SA, roflumilast, anastrozole, atorvastatin, Accu-Chek Aviva Plus, Accu-Chek Aviva, Accu-Chek FastClix Lancets, Vitamin D, Trelegy Ellipta, and Janumet XR.  Meds ordered this encounter  Medications   amLODipine (NORVASC) 10 MG tablet    Sig: Take 1 tablet (10 mg total) by mouth daily.    Dispense:  90 tablet    Refill:  1   Zoster Vaccine Adjuvanted [facility]) injection    Sig: Inject 0.5 mLs into the muscle once for 1 dose.    Dispense:  0.5 mL    Refill:  1   spironolactone (ALDACTONE) 25 MG tablet    Sig: Take 1 tablet (25 mg total) by mouth daily.    Dispense:  90 tablet    Refill:  1     Follow-up: Return in about 3 months (around 07/18/2019).  Scarlette Calico, MD

## 2019-04-23 ENCOUNTER — Other Ambulatory Visit: Payer: Self-pay | Admitting: Internal Medicine

## 2019-04-24 ENCOUNTER — Other Ambulatory Visit: Payer: Self-pay | Admitting: Internal Medicine

## 2019-04-24 ENCOUNTER — Encounter: Payer: Self-pay | Admitting: Internal Medicine

## 2019-04-24 DIAGNOSIS — E519 Thiamine deficiency, unspecified: Secondary | ICD-10-CM | POA: Insufficient documentation

## 2019-04-24 LAB — RETICULOCYTES
ABS Retic: 93500 cells/uL — ABNORMAL HIGH (ref 20000–8000)
Retic Ct Pct: 2.2 %

## 2019-04-24 LAB — THYROID PANEL WITH TSH
Free Thyroxine Index: 2.7 (ref 1.4–3.8)
T3 Uptake: 34 % (ref 22–35)
T4, Total: 7.8 ug/dL (ref 5.1–11.9)
TSH: 0.26 mIU/L — ABNORMAL LOW (ref 0.40–4.50)

## 2019-04-24 LAB — VITAMIN B1: Vitamin B1 (Thiamine): 7 nmol/L — ABNORMAL LOW (ref 8–30)

## 2019-04-24 MED ORDER — VITAMIN B-1 100 MG PO TABS: 100.0000 mg | ORAL_TABLET | ORAL | 1 refills | Status: DC

## 2019-05-14 DIAGNOSIS — J449 Chronic obstructive pulmonary disease, unspecified: Secondary | ICD-10-CM | POA: Diagnosis not present

## 2019-05-17 ENCOUNTER — Other Ambulatory Visit: Payer: Self-pay | Admitting: Internal Medicine

## 2019-05-21 ENCOUNTER — Other Ambulatory Visit: Payer: Self-pay

## 2019-05-22 ENCOUNTER — Encounter: Payer: Self-pay | Admitting: Endocrinology

## 2019-05-22 ENCOUNTER — Ambulatory Visit (INDEPENDENT_AMBULATORY_CARE_PROVIDER_SITE_OTHER): Payer: Medicare Other | Admitting: Endocrinology

## 2019-05-22 VITALS — BP 144/72 | HR 117 | Ht 64.5 in | Wt 204.0 lb

## 2019-05-22 DIAGNOSIS — E118 Type 2 diabetes mellitus with unspecified complications: Secondary | ICD-10-CM

## 2019-05-22 DIAGNOSIS — E0590 Thyrotoxicosis, unspecified without thyrotoxic crisis or storm: Secondary | ICD-10-CM | POA: Diagnosis not present

## 2019-05-22 LAB — POCT GLYCOSYLATED HEMOGLOBIN (HGB A1C): Hemoglobin A1C: 6.3 % — AB (ref 4.0–5.6)

## 2019-05-22 LAB — TSH: TSH: 0.64 u[IU]/mL (ref 0.35–4.50)

## 2019-05-22 LAB — T4, FREE: Free T4: 0.88 ng/dL (ref 0.60–1.60)

## 2019-05-22 NOTE — Progress Notes (Signed)
   Subjective:    Patient ID: Victoria [name], female    DOB: [birth year], 74 y.o.   MRN: [id number]  HPI Pt returns for f/u of diabetes mellitus: DM type: 2 Dx'ed: 0000000 Complications: CAD and PAD Therapy: Janumet GDM: never DKA: never Severe hypoglycemia: never Pancreatitis: never Pancreatic imaging: normal on 2008 CT.   Other: edema limits rx options; fructosamine showed better glycemic control than A1c.   Interval history: no recent steroids; She says cbg's are well-controlled.   Pt also has hyperthyroidism (dx'ed 2019; US showed MNG; bx of RLL and RML nodules in 2019 showed beth cat 2; she had RAI 11/20).  pt states she feels well in general.     Review of Systems Denies n/v/d.  No weight change.      Objective:   Physical Exam VITAL SIGNS:  See vs page GENERAL: no distress Pulses: dorsalis pedis intact bilat.   MSK: no deformity of the feet CV: no leg edema Skin:  no ulcer on the feet.  normal color and temp on the feet.  Neuro: sensation is intact to touch on the feet Ext: there is bilateral onychomycosis of the toenails  Lab Results  Component Value Date   HGBA1C 6.3 (A) 05/22/2019    Lab Results  Component Value Date   TSH 0.64 05/22/2019   T3TOTAL 94.0 10/14/2015   T4TOTAL 7.8 04/19/2019       Assessment & Plan:  Type 2 DM, with PAD: well-controlled Hyperthyroidism: well-controlled Please continue the same medications.    Patient Instructions  Blood tests are requested for you today.  We'll let you know about the results.  Please continue the same Janumet.   Please come back for a follow-up appointment in 1 month.

## 2019-05-22 NOTE — Patient Instructions (Addendum)
Blood tests are requested for you today.  We'll let you know about the results.  Please continue the same Janumet.   Please come back for a follow-up appointment in 1 month.

## 2019-05-23 ENCOUNTER — Telehealth: Payer: Self-pay

## 2019-05-23 NOTE — Telephone Encounter (Signed)
-----   Message from Renato Shin, MD sent at 05/22/2019  7:03 PM EST ----- please contact patient: throid is normal--good

## 2019-05-23 NOTE — Telephone Encounter (Signed)
Lab results reviewed by Dr. Ellison. Letter has been mailed. For future reference, letter can be found in Epic. 

## 2019-06-01 ENCOUNTER — Telehealth: Payer: Self-pay | Admitting: Internal Medicine

## 2019-06-01 NOTE — Telephone Encounter (Signed)
Called and spoke with pt letting her know that she should get the covid vaccine as soon as she is able to and pt verbalized understanding. Nothing further needed.

## 2019-06-13 DIAGNOSIS — G4733 Obstructive sleep apnea (adult) (pediatric): Secondary | ICD-10-CM | POA: Diagnosis not present

## 2019-06-14 DIAGNOSIS — J449 Chronic obstructive pulmonary disease, unspecified: Secondary | ICD-10-CM | POA: Diagnosis not present

## 2019-06-19 ENCOUNTER — Ambulatory Visit: Payer: Medicare Other

## 2019-06-20 ENCOUNTER — Ambulatory Visit (INDEPENDENT_AMBULATORY_CARE_PROVIDER_SITE_OTHER): Payer: Medicare Other | Admitting: Internal Medicine

## 2019-06-20 ENCOUNTER — Encounter: Payer: Self-pay | Admitting: Internal Medicine

## 2019-06-20 ENCOUNTER — Other Ambulatory Visit: Payer: Self-pay

## 2019-06-20 VITALS — BP 142/88 | HR 102 | Temp 98.5°F | Ht 64.5 in | Wt 207.1 lb

## 2019-06-20 DIAGNOSIS — E876 Hypokalemia: Secondary | ICD-10-CM

## 2019-06-20 DIAGNOSIS — E519 Thiamine deficiency, unspecified: Secondary | ICD-10-CM | POA: Diagnosis not present

## 2019-06-20 DIAGNOSIS — E118 Type 2 diabetes mellitus with unspecified complications: Secondary | ICD-10-CM | POA: Diagnosis not present

## 2019-06-20 DIAGNOSIS — I10 Essential (primary) hypertension: Secondary | ICD-10-CM | POA: Diagnosis not present

## 2019-06-20 LAB — BASIC METABOLIC PANEL
BUN: 20 mg/dL (ref 6–23)
CO2: 30 mEq/L (ref 19–32)
Calcium: 9.9 mg/dL (ref 8.4–10.5)
Chloride: 102 mEq/L (ref 96–112)
Creatinine, Ser: 0.74 mg/dL (ref 0.40–1.20)
GFR: 92.48 mL/min (ref >60.00)
Glucose, Bld: 112 mg/dL — ABNORMAL HIGH (ref 70–99)
Potassium: 4.2 mEq/L (ref 3.5–5.1)
Sodium: 137 mEq/L (ref 135–145)

## 2019-06-20 LAB — CBC WITH DIFFERENTIAL/PLATELET
Basophils Absolute: 0 10*3/uL (ref 0.0–0.1)
Basophils Relative: 0.5 % (ref 0.0–3.0)
Eosinophils Absolute: 0.1 10*3/uL (ref 0.0–0.7)
Eosinophils Relative: 1.3 % (ref 0.0–5.0)
HCT: 36.5 % (ref 36.0–46.0)
Hemoglobin: 11.9 g/dL — ABNORMAL LOW (ref 12.0–15.0)
Lymphocytes Relative: 19.1 % (ref 12.0–46.0)
Lymphs Abs: 1.8 10*3/uL (ref 0.7–4.0)
MCHC: 32.6 g/dL (ref 30.0–36.0)
MCV: 83.8 fl (ref 78.0–100.0)
Monocytes Absolute: 0.7 10*3/uL (ref 0.1–1.0)
Monocytes Relative: 7.1 % (ref 3.0–12.0)
Neutro Abs: 6.7 10*3/uL (ref 1.4–7.7)
Neutrophils Relative %: 72 % (ref 43.0–77.0)
Platelets: 246 10*3/uL (ref 150.0–400.0)
RBC: 4.36 Mil/uL (ref 3.87–5.11)
RDW: 15 % (ref 11.5–15.5)
WBC: 9.3 10*3/uL (ref 4.0–10.5)

## 2019-06-20 LAB — MAGNESIUM: Magnesium: 1.7 mg/dL (ref 1.5–2.5)

## 2019-06-20 NOTE — Patient Instructions (Signed)

## 2019-06-20 NOTE — Progress Notes (Signed)
Subjective:  Patient ID: Victoria [name], female    DOB: December 29, 1945  Age: 74 y.o. MRN: [id number]  CC: Hypertension and Diabetes  This visit occurred during the SARS-CoV-2 public health emergency.  Safety protocols were in place, including screening questions prior to the visit, additional usage of staff PPE, and extensive cleaning of exam room while observing appropriate contact time as indicated for disinfecting solutions.    HPI Victoria [name] presents for f/up - She is O2 dependent and has an unchanged level of shortness of breath.  She denies any recent episodes of cough, wheezing, chest pain, or hemoptysis.  She comes in today to have her potassium level monitored.  Outpatient Medications Prior to Visit  Medication Sig Dispense Refill  . Accu-Chek FastClix Lancets MISC 1 each by Does not apply route daily. Use to monitor glucose levels once per day; E11.9 102 each 2  . acetaminophen (TYLENOL) 500 MG tablet Take 1,000 mg by mouth every 6 (six) hours as needed.    . Albuterol Sulfate (PROAIR RESPICLICK) 813 (90 Base) MCG/ACT AEPB Inhale 1-2 puffs into the lungs every 6 (six) hours as needed (for wheezing/shortness of breath). 1 each 0  . amLODipine (NORVASC) 10 MG tablet Take 1 tablet (10 mg total) by mouth daily. 90 tablet 1  . anastrozole (ARIMIDEX) 1 MG tablet Take 1 tablet (1 mg total) by mouth daily. 90 tablet 4  . atorvastatin (LIPITOR) 80 MG tablet Take 1 tablet (80 mg total) by mouth daily at 6 PM. 90 tablet 3  . Blood Glucose Monitoring Suppl (ACCU-CHEK AVIVA PLUS) w/Device KIT 1 Device by Does not apply route daily. Use to monitor glucose levels once per day; E11.9 1 kit 0  . Cholecalciferol (VITAMIN D) 125 MCG (5000 UT) CAPS Take 1 capsule by mouth daily.    [name] DALIRESP 500 MCG TABS tablet Take 1 tablet by mouth once daily 90 tablet 1  . denosumab (PROLIA) 60 MG/ML SOLN injection Inject 60 mg into the skin every 6 (six) months. Administer in upper arm, thigh, or  abdomen    . glucose blood (ACCU-CHEK AVIVA) test strip 1 each by Other route daily. Use to monitor glucose levels once per day; E11.9 100 each 2  . OXYGEN Inhale 2-3 L into the lungs continuous. When exerting self     . potassium chloride SA (K-DUR,KLOR-CON) 20 MEQ tablet Take 1 tablet (20 mEq total) by mouth 2 (two) times daily. 180 tablet 1  . SitaGLIPtin-MetFORMIN HCl (JANUMET XR) 50-1000 MG TB24 Take 1 tablet by mouth daily. 180 tablet 3  . spironolactone (ALDACTONE) 25 MG tablet Take 1 tablet (25 mg total) by mouth daily. 90 tablet 1  . thiamine (VITAMIN B-1) 100 MG tablet Take 1 tablet (100 mg total) by mouth every other day. 45 tablet 1  . TRELEGY ELLIPTA 100-62.5-25 MCG/INH AEPB INHALE 1 DOSE INTO THE LUNGS DAILY. 60 each 5   No facility-administered medications prior to visit.    ROS Review of Systems  Constitutional: Positive for unexpected weight change (wt gain). Negative for appetite change, chills, diaphoresis and fatigue.  HENT: Negative.   Eyes: Negative.   Respiratory: Positive for shortness of breath. Negative for cough, chest tightness and wheezing.   Cardiovascular: Negative for chest pain, palpitations and leg swelling.  Gastrointestinal: Negative for abdominal pain, constipation, diarrhea, nausea and vomiting.  Endocrine: Negative.   Genitourinary: Negative for difficulty urinating.  Musculoskeletal: Positive for arthralgias. Negative for myalgias.  Skin: Negative.   Neurological:  Negative.  Negative for dizziness, weakness and light-headedness.  Hematological: Negative for adenopathy. Does not bruise/bleed easily.  Psychiatric/Behavioral: Negative.     Objective:  BP (!) 142/88 (BP Location: Left Arm, Patient Position: Sitting, Cuff Size: Normal)   Pulse (!) 102   Temp 98.5 F (36.9 C) (Oral)   Ht 5' 4.5" (1.638 m)   Wt 207 lb 2 oz (94 kg)   SpO2 98%   BMI 35.00 kg/m   BP Readings from Last 3 Encounters:  06/20/19 (!) 142/88  05/22/19 (!) 144/72    04/19/19 138/60    Wt Readings from Last 3 Encounters:  06/20/19 207 lb 2 oz (94 kg)  05/22/19 204 lb (92.5 kg)  04/19/19 204 lb (92.5 kg)    Physical Exam Vitals reviewed.  Constitutional:      Appearance: Normal appearance. She is ill-appearing (O2 dependent). She is not diaphoretic.  HENT:     Mouth/Throat:     Mouth: Mucous membranes are moist.  Eyes:     General: No scleral icterus.    Conjunctiva/sclera: Conjunctivae normal.  Cardiovascular:     Rate and Rhythm: Normal rate and regular rhythm.     Heart sounds: Murmur present. Systolic murmur present with a grade of 1/6. No diastolic murmur. No gallop.   Pulmonary:     Effort: Pulmonary effort is normal. No respiratory distress.     Breath sounds: No stridor. No wheezing, rhonchi or rales.  Abdominal:     General: Abdomen is protuberant. Bowel sounds are normal. There is no distension.     Palpations: Abdomen is soft. There is no hepatomegaly or splenomegaly.     Tenderness: There is no abdominal tenderness.  Musculoskeletal:        General: Normal range of motion.     Cervical back: Neck supple.     Right lower leg: No edema.     Left lower leg: No edema.  Lymphadenopathy:     Cervical: No cervical adenopathy.  Skin:    General: Skin is warm and dry.  Neurological:     General: No focal deficit present.     Mental Status: She is alert.  Psychiatric:        Mood and Affect: Mood normal.        Behavior: Behavior normal.     Lab Results  Component Value Date   WBC 9.3 06/20/2019   HGB 11.9 (L) 06/20/2019   HCT 36.5 06/20/2019   PLT 246.0 06/20/2019   GLUCOSE 112 (H) 06/20/2019   CHOL 149 01/08/2019   TRIG 148 01/08/2019   HDL 53 01/08/2019   LDLDIRECT 121.0 09/05/2014   LDLCALC 66 01/08/2019   ALT 8 03/21/2019   AST 13 (L) 03/21/2019   NA 137 06/20/2019   K 4.2 06/20/2019   CL 102 06/20/2019   CREATININE 0.74 06/20/2019   BUN 20 06/20/2019   CO2 30 06/20/2019   TSH 0.64 05/22/2019   INR 1.01  05/28/2014   HGBA1C 6.3 (A) 05/22/2019   MICROALBUR 1.4 04/19/2019    DG Bone Density  Result Date: 04/10/2019 Date of study: 04/10/2019 Exam: DUAL X-RAY ABSORPTIOMETRY (DXA) FOR BONE MINERAL DENSITY (BMD) Instrument: [employer] Provider: PCP Indication: follow up for low BMD Comparison: none (please note that it is not possible to compare data from different instruments) Clinical data: Pt is a 74 y.o. female on Anastrozole and  Prolia Results:  Lumbar spine L1-L3 Femoral neck (FN) T-score -1.5 RFN: -1.2 LFN: -2.2 Change  in BMD from previous DXA test (%) Up 6.5* Up 4.3 (*) statistically significant Assessment: By the [facility] Criteria for diagnosis based on bone density, this patient has Low Bone Density Z Score compares the patients bone density to age, sex, and race matched controls.  Compared to age, sex, and race matched controls, this patient's bone density is Average  FRAX 10-year fracture risk calculator: Not calculated- Pt on Prolia  L4 vertebra had to be excluded from analysis due to DJD Comments: the technical quality of the study is good. WHO criteria for diagnosis of osteoporosis in postmenopausal women and in men 65 y/o or older: - normal: T-score -1.0 to + 1.0 - osteopenia/low bone density: T-score between -2.5 and -1.0 - osteoporosis: T-score below -2.5 - severe osteoporosis: T-score below -2.5 With history of fragility fracture Note: although not part of the WHO classification, the presence of a fragility fracture, regardless of the T-score, should be considered diagnostic of osteoporosis, provided other causes for the fracture have been excluded. RECOMMENDATIONS:  Recommend optimizing calcium (1200 mg/day) and vitamin D (800 IU/day) intake  Continue current treatment  Follow up BMD is recommended: 2 years. Interpreted by : Mack Guise, MD [facility]    Assessment & Plan:   Zeeva was seen today for hypertension and diabetes.  Diagnoses and  all orders for this visit:  Essential hypertension- Her blood pressure is well controlled.  Electrolytes and renal function are normal. -     CBC with Differential/Platelet -     Magnesium -     Basic metabolic panel  Hypokalemia- Her potassium level is normal now.  I recommended she stay on the current dose of Aldactone and potassium supplementation. -     Magnesium -     Basic metabolic panel  Thiamine deficiency - She is mildly anemic.  I recommended she currently remain you taking the current vit B1 supplementation. -     CBC with Differential/Platelet  Type II diabetes mellitus with manifestations ([place])- Her recent A1c was 6.3%.  Her blood sugars are adequately well controlled. -     HM Diabetes Foot Exam   I am having Shaquasia BRonnald Ramp maintain her OXYGEN, denosumab, acetaminophen, Albuterol Sulfate, potassium chloride SA, anastrozole, atorvastatin, Accu-Chek Aviva Plus, Accu-Chek Aviva, Accu-Chek FastClix Lancets, Vitamin D, Janumet XR, amLODipine, spironolactone, thiamine, Trelegy Ellipta, and Daliresp.  No orders of the defined types were placed in this encounter.    Follow-up: Return in about 6 months (around 12/18/2019).  Scarlette Calico, MD

## 2019-06-22 ENCOUNTER — Other Ambulatory Visit: Payer: Self-pay

## 2019-06-26 ENCOUNTER — Encounter: Payer: Self-pay | Admitting: Endocrinology

## 2019-06-26 ENCOUNTER — Ambulatory Visit: Payer: Medicare Other | Admitting: Endocrinology

## 2019-06-26 ENCOUNTER — Other Ambulatory Visit: Payer: Self-pay

## 2019-06-26 VITALS — BP 132/60 | HR 112 | Ht 64.5 in | Wt 208.2 lb

## 2019-06-26 DIAGNOSIS — E0590 Thyrotoxicosis, unspecified without thyrotoxic crisis or storm: Secondary | ICD-10-CM | POA: Diagnosis not present

## 2019-06-26 LAB — TSH: TSH: 0.73 u[IU]/mL (ref 0.35–4.50)

## 2019-06-26 LAB — T4, FREE: Free T4: 0.78 ng/dL (ref 0.60–1.60)

## 2019-06-26 NOTE — Progress Notes (Signed)
Subjective:    Patient ID: Victoria [name], female    DOB: Apr 12, 1946, 74 y.o.   MRN: [id number]  HPI Pt returns for f/u of diabetes mellitus: DM type: 2 Dx'ed: 2947 Complications: CAD and PAD Therapy: Janumet GDM: never DKA: never Severe hypoglycemia: never Pancreatitis: never Pancreatic imaging: normal on 2008 CT.   Other: edema limits rx options; fructosamine showed better glycemic control than A1c.   Interval history: no recent steroids; She says cbg's vary from 111-130.  Pt also has hyperthyroidism (dx'ed 2019; US showed MNG; bx of RLL and RML nodules in 2019 showed beth cat 2; she had RAI 11/20).  pt states she feels well in general.   Past Medical History:  Diagnosis Date  . Abdominal aneurysm ([place])    Dr. Oneida Alar follows lLOV 2 ''17 per pt "around 2 cm"  . Anemia    as a child  . Arthritis    left ankle, right knee, right SI joint, wrists, lower back  . Breast cancer in female [facility])    Left  . COPD (chronic obstructive pulmonary disease) ([place])    ephysema-Dr. Chase Caller  . Dyspnea   . Headache    as a child would have terrible headaches during season changes  . 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 Hyperlipidemia   . Hypertension   . Multiple thyroid nodules   . Murmur, cardiac 1950  . Osteoporosis   . Pneumonia   . Pre-diabetes   . Requires continuous at home supplemental oxygen    2 L 24/7  . Sebaceous cyst    hairline sebaceous cyst left posterior neck to be excised 04-13-16 by Dr. Harlow Asa in [place] [facility]  . Sleep apnea    cpap used sometimes, uses oxygen concentrator 2 l/m nasally bedtime  . Varicella as child    Past Surgical History:  Procedure Laterality Date  . APPENDECTOMY     2008  . BREAST LUMPECTOMY Left 08/31/2017  . BREAST LUMPECTOMY WITH RADIOACTIVE SEED LOCALIZATION Left 08/10/2017   Procedure: BREAST LUMPECTOMY WITH RADIOACTIVE SEED LOCALIZATION;  Surgeon: Rolm Bookbinder, MD;  Location: [place];  Service: General;   Laterality: Left;  . [place]  . COLONOSCOPY    . COLONOSCOPY WITH PROPOFOL N/A 04/16/2016   Procedure: COLONOSCOPY WITH PROPOFOL;  Surgeon: Carol Ada, MD;  Location: [facility];  Service: Endoscopy;  Laterality: N/A;  . CYST REMOVAL NECK Left 04/13/2016   Procedure: EXCISION OF SEBACEOUS CYST LEFT POSTERIOR NECK;  Surgeon: Armandina Gemma, MD;  Location: [facility];  Service: General;  Laterality: Left;  . Excision of Pelvic Absess, Right Ovary     2008  . RE-EXCISION OF BREAST CANCER,SUPERIOR MARGINS Left 08/31/2017   Procedure: RE-EXCISION OF LEFT  BREAST MARGINS ERAS PATHWAY;  Surgeon: Rolm Bookbinder, MD;  Location: [place];  Service: General;  Laterality: Left;  . TUBAL LIGATION  1980    Social History   Socioeconomic History  . Marital status: Divorced    Spouse name: Not on file  . Number of children: 1  . Years of education: 42  . Highest education level: Not on file  Occupational History  . Occupation: Social worker, Technical sales engineer: [employer]  Tobacco Use  . Smoking status: Former Smoker    Packs/day: 1.00    Years: 50.00    Pack years: 50.00    Types: Cigarettes    Quit date: 04/16/2011    Years  since quitting: 8.2  . Smokeless tobacco: Never Used  . Tobacco comment: quit that date when she had to go to ER   Substance and Sexual Activity  . Alcohol use: Yes    Alcohol/week: 0.0 standard drinks    Comment: rare occasion  . Drug use: No    Comment: no marijuana since 2012  . Sexual activity: Not Currently  Other Topics Concern  . Not on file  Social History Narrative   HSG; [name], [place]. . Married '69 - 9 yrs/divorced. 1 son - '72; no grandchildren.   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 No h/o physical or sexual abuse. ACP - no living will - wants information. Provided packet of information. On 08/05/2011: she stated she was distant cousins to celebrities [name] and  [name]      Social Determinants of Health   Financial Resource Strain:   . Difficulty of Paying Living Expenses: Not on file  Food Insecurity:   . Worried About Charity fundraiser in the Last Year: Not on file  . Ran Out of Food in the Last Year: Not on file  Transportation Needs:   . Lack of Transportation (Medical): Not on file  . Lack of Transportation (Non-Medical): Not on file  Physical Activity:   . Days of Exercise per Week: Not on file  . Minutes of Exercise per Session: Not on file  Stress:   . Feeling of Stress : Not on file  Social Connections:   . Frequency of Communication with Friends and Family: Not on file  . Frequency of Social Gatherings with Friends and Family: Not on file  . Attends Religious Services: Not on file  . Active Member of Clubs or Organizations: Not on file  . Attends Archivist Meetings: Not on file  . Marital Status: Not on file  Intimate Partner Violence:   . Fear of Current or Ex-Partner: Not on file  . Emotionally Abused: Not on file  . Physically Abused: Not on file  . Sexually Abused: Not on file    Current Outpatient Medications on File Prior to Visit  Medication Sig Dispense Refill  . Accu-Chek FastClix Lancets MISC 1 each by Does not apply route daily. Use to monitor glucose levels once per day; E11.9 102 each 2  . acetaminophen (TYLENOL) 500 MG tablet Take 1,000 mg by mouth every 6 (six) hours as needed.    . Albuterol Sulfate (PROAIR RESPICLICK) 295 (90 Base) MCG/ACT AEPB Inhale 1-2 puffs into the lungs every 6 (six) hours as needed (for wheezing/shortness of breath). 1 each 0  . amLODipine (NORVASC) 10 MG tablet Take 1 tablet (10 mg total) by mouth daily. 90 tablet 1  . anastrozole (ARIMIDEX) 1 MG tablet Take 1 tablet (1 mg total) by mouth daily. 90 tablet 4  . atorvastatin (LIPITOR) 80 MG tablet Take 1 tablet (80 mg total) by mouth daily at 6 PM. 90 tablet 3  . Blood Glucose Monitoring Suppl (ACCU-CHEK AVIVA PLUS)  w/Device KIT 1 Device by Does not apply route daily. Use to monitor glucose levels once per day; E11.9 1 kit 0  . Cholecalciferol (VITAMIN D) 125 MCG (5000 UT) CAPS Take 1 capsule by mouth daily.    [name] DALIRESP 500 MCG TABS tablet Take 1 tablet by mouth once daily 90 tablet 1  . denosumab (PROLIA) 60 MG/ML SOLN injection Inject 60 mg into the skin every 6 (six) months. Administer in upper arm,  thigh, or abdomen    . glucose blood (ACCU-CHEK AVIVA) test strip 1 each by Other route daily. Use to monitor glucose levels once per day; E11.9 100 each 2  . OXYGEN Inhale 2-3 L into the lungs continuous. When exerting self     . potassium chloride SA (K-DUR,KLOR-CON) 20 MEQ tablet Take 1 tablet (20 mEq total) by mouth 2 (two) times daily. 180 tablet 1  . SitaGLIPtin-MetFORMIN HCl (JANUMET XR) 50-1000 MG TB24 Take 1 tablet by mouth daily. 180 tablet 3  . spironolactone (ALDACTONE) 25 MG tablet Take 1 tablet (25 mg total) by mouth daily. 90 tablet 1  . thiamine (VITAMIN B-1) 100 MG tablet Take 1 tablet (100 mg total) by mouth every other day. 45 tablet 1  . TRELEGY ELLIPTA 100-62.5-25 MCG/INH AEPB INHALE 1 DOSE INTO THE LUNGS DAILY. 60 each 5   No current facility-administered medications on file prior to visit.    Allergies  Allergen Reactions  . Benicar [Olmesartan] Swelling    Swelling of face and arms   . [name] [Valsartan] Swelling    Swelling of face and arms   . Hydrocodone-Acetaminophen Nausea And Vomiting    Severe vomiting  . Lisinopril Cough  . Monosodium Glutamate Other (See Comments)    Facial swelling per pt  . Codeine Other (See Comments)    jittery  . Lead Acetate Rash  . Nickel Rash    Severe rash to infection: pt is allergic to all metals other than sterling silver or gold jewelry.     Family History  Problem Relation Age of Onset  . Heart disease Mother        MI - fatal  . Hypertension Mother   . Stroke Father 61       fatal  . Alzheimer's disease Father   .  Alzheimer's disease Brother   . Hyperlipidemia Brother   . Hypertension Brother   . Diabetes Brother   . Hypertension Brother   . Hyperlipidemia Brother   . Breast cancer Paternal Grandmother     BP 132/60 (BP Location: Left Arm, Patient Position: Sitting, Cuff Size: Large)   Pulse (!) 112   Ht 5' 4.5" (1.638 m)   Wt 208 lb 3.2 oz (94.4 kg)   SpO2 92%   BMI 35.19 kg/m    Review of Systems She denies hypoglycemia and neck swelling.      Objective:   Physical Exam VITAL SIGNS:  See vs page GENERAL: no distress NECK: bilat nodules are again noted.  Pulses: dorsalis pedis intact bilat.   MSK: no deformity of the feet CV: no leg edema Skin:  no ulcer on the feet.  normal color and temp on the feet. Neuro: sensation is intact to touch on the feet Ext: there is bilateral onychomycosis of the toenails  Lab Results  Component Value Date   HGBA1C 6.3 (A) 05/22/2019   Lab Results  Component Value Date   TSH 0.73 06/26/2019   T3TOTAL 94.0 10/14/2015   T4TOTAL 7.8 04/19/2019      Assessment & Plan:  Hyperthyroidism: better after RAI rx MNG: clinically stable Type 2 DM, with PAD: well-controlled.   Patient Instructions  Blood tests are requested for you today.  We'll let you know about the results.  Please continue the same Janumet.   Please come back for a follow-up appointment in 1 month.

## 2019-06-26 NOTE — Patient Instructions (Signed)
Blood tests are requested for you today.  We'll let you know about the results.  Please continue the same Janumet.   Please come back for a follow-up appointment in 1 month.

## 2019-06-27 ENCOUNTER — Telehealth: Payer: Self-pay

## 2019-06-27 NOTE — Telephone Encounter (Signed)
-----   Message from Renato Shin, MD sent at 06/26/2019  5:53 PM EST ----- please contact patient: Normal--good.  I'll see you next time.

## 2019-06-27 NOTE — Telephone Encounter (Signed)
Lab results reviewed by Dr. Ellison. Letter has been mailed. For future reference, letter can be found in Epic. 

## 2019-06-28 ENCOUNTER — Telehealth: Payer: Self-pay | Admitting: Internal Medicine

## 2019-06-28 NOTE — Telephone Encounter (Signed)
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 She is having a hard time deciding whether to go back to work.  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 She would like Dr. Golden Pop opinion on whether she should go back to work and if so what precautions should she take. Please advise.

## 2019-06-28 NOTE — Telephone Encounter (Signed)
1. Can she do the work with her copd and o2 need?  2. If yes, then can she mask and client also mask and do it? - this is ideal but if not she just take the risk that the vaccine has protected both of them  3. Ultimately it is about risk tolerance

## 2019-06-29 NOTE — Telephone Encounter (Signed)
Dr. Chase Caller,   I called the patient back and advised her of your response. She requested to have this sent to her in a letter so it can be presented to her employer.  If you agree with this, can the information you have below be dropped into a letter for signature, or signature stamp to be used?  Also she has been set up for an appointment to see you on 07/25/19.

## 2019-06-29 NOTE — Telephone Encounter (Signed)
1. Can she do the work with her copd and o2 need?  Yes, she is able to do her work with her COPD and O2 needs - this is the type of work that she has been doing all along.  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  When her office closed in March 2020 d/t Covid, her employer had made arrangements for her to go to client's home but client's parents were concerned about client's and pt's vulnerability.  Client's parents stated patient has NOT had his vaccine and they asked if client did get his vaccine, if she would be able to work with client again at her workplace.  Parents do not know when client would be able to obtain his vaccine.  Patient stated that she is not in a hurry, client's parents are just 'bugging' her - she just wants to be prepared.  2. If yes, then can she mask and client also mask and do it? - this is ideal but if not she just take the risk that the vaccine has protected both of them  Yes, patient stated she will definitely wear a mask.  She is unsure about her client but will double check on this (he has brittle bone disease and resides at an alternative living house with a provider).  3. Ultimately it is about risk tolerance  Dr Chase Caller - patient stated that she was also diagnosed with Type II Diabetes in July 2020 and Hyperthyroidism 02/2019.  She is following Dr Loanne Drilling for these diagnoses.  Would these, in addition to her pulmonary diagnoses weigh in on your opinion?  Patient stated again that she would just like to be prepared.   Thank you.

## 2019-06-29 NOTE — Telephone Encounter (Signed)
Overall given the fact both of had vaccine and the fact she will be masking the risk is low the patient gets Covid.  Letter know that it is still not zero risk but a low risk may be even moderate risk.  Reassuringly there is no monoclonal antibody treatment available if she were to get Covid.  And he has diabetes and her medical issues increase the level of risk of getting severe Covid if she were to get Covid.   Overall if she is keen on work and if it gives her purpose t income she could consider excepting the risk and going to work

## 2019-07-02 NOTE — Telephone Encounter (Signed)
MR please advise. Thanks! 

## 2019-07-06 NOTE — Telephone Encounter (Signed)
Okay to type a letter based on my responses I am available on Monday, July 09, 2019 to sign it

## 2019-07-09 ENCOUNTER — Encounter: Payer: Self-pay | Admitting: Emergency Medicine

## 2019-07-09 ENCOUNTER — Other Ambulatory Visit: Payer: Self-pay

## 2019-07-09 ENCOUNTER — Telehealth: Payer: Self-pay | Admitting: Endocrinology

## 2019-07-09 DIAGNOSIS — E118 Type 2 diabetes mellitus with unspecified complications: Secondary | ICD-10-CM

## 2019-07-09 MED ORDER — ACCU-CHEK AVIVA PLUS W/DEVICE KIT: 1.0000 | PACK | Freq: Every day | 0 refills | Status: DC

## 2019-07-09 NOTE — Telephone Encounter (Signed)
Outpatient Medication Detail   Disp Refills Start End   Blood Glucose Monitoring Suppl (ACCU-CHEK AVIVA PLUS) w/Device KIT 1 kit 0 07/09/2019    Sig - Route: 1 Device by Does not apply route daily. Use to monitor glucose levels once per day; E11.9 - Does not apply   Sent to pharmacy as: Blood Glucose Monitoring Suppl (ACCU-CHEK AVIVA PLUS) w/Device Kit   E-Prescribing Status: Receipt confirmed by pharmacy (07/09/2019  1:24 PM EST)

## 2019-07-09 NOTE — Telephone Encounter (Signed)
Letter generated. Raquel Sarna, CMA, will have MR sign letter today as he is in clinic. Once signed will call pt to see how she would like the letter given to her.

## 2019-07-09 NOTE — Telephone Encounter (Signed)
Letter has been signed. Called and spoke to pt. Pt aware of letter and would like to pick this up today, this has been placed in file cabinet up front for pick up. Pt verbalized understanding and denied any further questions or concerns at this time.

## 2019-07-09 NOTE — Telephone Encounter (Signed)
MEDICATION: accucheck meter  PHARMACY:  [facility] on [address]  IS THIS A 90 DAY SUPPLY : n  IS PATIENT OUT OF MEDICATION: yes  IF NOT; HOW MUCH IS LEFT: pts meter is now broken  LAST APPOINTMENT DATE: _0 /02/2020  NEXT APPOINTMENT DATE:_1 /01/2020  DO WE HAVE YOUR PERMISSION TO LEAVE A DETAILED MESSAGE:  OTHER COMMENTS:  She wants to see if we will please call in a new kit for her to pick up as her meter is now broken  **Let patient know to contact pharmacy at the end of the day to make sure medication is ready. **  ** Please notify patient to allow 48-72 hours to process**  **Encourage patient to contact the pharmacy for refills or they can request refills through [facility]**

## 2019-07-11 ENCOUNTER — Other Ambulatory Visit: Payer: Self-pay

## 2019-07-11 ENCOUNTER — Telehealth: Payer: Self-pay | Admitting: Endocrinology

## 2019-07-11 DIAGNOSIS — E118 Type 2 diabetes mellitus with unspecified complications: Secondary | ICD-10-CM

## 2019-07-11 MED ORDER — ACCU-CHEK AVIVA PLUS VI STRP: 1.0000 | ORAL_STRIP | Freq: Every day | 0 refills | Status: DC

## 2019-07-11 NOTE — Telephone Encounter (Signed)
Patient called saying she just got her Accu-Chek and it did not come with test strips, patient says the old test strips do not fit this new one. Patient requests test strips to be sent to   [facility] ([place]), [address] Phone:  [phone]  Fax:  [phone]

## 2019-07-11 NOTE — Telephone Encounter (Signed)
Outpatient Medication Detail   Disp Refills Start End   glucose blood (ACCU-CHEK AVIVA PLUS) test strip 100 each 0 07/11/2019    Sig - Route: 1 each by Other route daily. E11.9 - Other   Sent to pharmacy as: glucose blood (ACCU-CHEK AVIVA PLUS) test strip   E-Prescribing Status: Receipt confirmed by pharmacy (07/11/2019  9:57 AM EST)

## 2019-07-15 DIAGNOSIS — J449 Chronic obstructive pulmonary disease, unspecified: Secondary | ICD-10-CM | POA: Diagnosis not present

## 2019-07-20 ENCOUNTER — Other Ambulatory Visit: Payer: Self-pay

## 2019-07-20 ENCOUNTER — Telehealth: Payer: Self-pay

## 2019-07-20 NOTE — Telephone Encounter (Signed)

## 2019-07-21 NOTE — Progress Notes (Signed)
Virtual Visit via Telephone Note   This visit type was conducted due to national recommendations for restrictions regarding the COVID-19 Pandemic (e.g. social distancing) in an effort to limit this patient's exposure and mitigate transmission in our community.  Due to her co-morbid illnesses, this patient is at least at moderate risk for complications without adequate follow up.  This format is felt to be most appropriate for this patient at this time.  The patient did not have access to video technology/had technical difficulties with video requiring transitioning to audio format only (telephone).  All issues noted in this document were discussed and addressed.  No physical exam could be performed with this format.  Please refer to the patient's chart for her  consent to telehealth for [facility].   The patient was identified using 2 identifiers.  Date:  07/23/2019   ID:  Victoria [name], DOB [birth year], MRN [id number]  Patient Location: Home Provider Location: Home  PCP:  Janith Lima, MD  Cardiologist:  Shelva Majestic, MD  Electrophysiologist:  None   Evaluation Performed:  Follow-Up Visit  Chief Complaint:  Routine follow-up  History of Present Illness:    Victoria [name] is a 74 y.o. female with a PMH of non-obstructive CAD on coronary CTA 11/2018, HTN, HLD, COPD, DM type 2, OSA on CPAP, and former tobacco use (quit in 2012), who presents for routine follow-up.   She was last evaluated by cardiology at an outpatient visit with Coletta Memos, NP, at which time she was doing well from a cardiac standpoint. No medication changes occurred and she was recommended to follow-up in 6 months. Her last ischemic evaluation was a coronary CTA 11/2018 to evaluate previously noted coronary artery calcifications/aortic athrosclersosis noted on prior CT Chest, though she had no anginal complaints. This revealed moderate CAD in the pLAD and RCA which were not felt to be hemodynamically  significant on FFR study.  Echo 10/2018 showed EF >65%, moderate LVH, G1DD, no RWMA, mild MAC, otherwise no significant valvular abnormalities.   She presents today for routine follow-up. She has been doing well from a cardiac standpoint since her last visit. She has chronic SOB from her COPD which limits her activity, thought his has been unchanged in recent months. She denies chest pain, palpitations, dizziness, lightheadedness, syncope, LE edema. She reports blood pressures are typically in the 130s-140s/70s-80s. She has only been taking amlodipine and spironolactone (recently transitioned from chlorthalidone). We discussed prior antihypertensive medications given multiple allergies. She recalls being on carvedilol in the past and did not have any complications from this medication. We discussed the importance of aggressive risk factor modifications to prevent progression of her heart disease. She has not been taking a baby aspirin. She states she has not been using her CPAP as much as she should and hopes to improve her compliance going forward. She reports being fully vaccinated for COVID-19 and has continued practicing social distancing given her PMH.   The patient does not have symptoms concerning for COVID-19 infection (fever, chills, cough, or new shortness of breath).    Past Medical History:  Diagnosis Date  . Abdominal aneurysm ([place])    Dr. Oneida Alar follows lLOV 2 ''17 per pt "around 2 cm"  . Anemia    as a child  . Arthritis    left ankle, right knee, right SI joint, wrists, lower back  . Breast cancer in female [facility])    Left  . COPD (chronic obstructive pulmonary disease) ([place])  ephysema-Dr. Chase Caller  . Dyspnea   . Headache    as a child would have terrible headaches during season changes  . 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 Hyperlipidemia   . Hypertension   . Multiple thyroid nodules   . Murmur, cardiac 1950  . Osteoporosis   . Pneumonia   . Pre-diabetes   .  Requires continuous at home supplemental oxygen    2 L 24/7  . Sebaceous cyst    hairline sebaceous cyst left posterior neck to be excised 04-13-16 by Dr. Harlow Asa in [place] [facility]  . Sleep apnea    cpap used sometimes, uses oxygen concentrator 2 l/m nasally bedtime  . Varicella as child   Past Surgical History:  Procedure Laterality Date  . APPENDECTOMY     2008  . BREAST LUMPECTOMY Left 08/31/2017  . BREAST LUMPECTOMY WITH RADIOACTIVE SEED LOCALIZATION Left 08/10/2017   Procedure: BREAST LUMPECTOMY WITH RADIOACTIVE SEED LOCALIZATION;  Surgeon: Rolm Bookbinder, MD;  Location: [facility];  Service: General;  Laterality: Left;  . [place]  . COLONOSCOPY    . COLONOSCOPY WITH PROPOFOL N/A 04/16/2016   Procedure: COLONOSCOPY WITH PROPOFOL;  Surgeon: Carol Ada, MD;  Location: [facility];  Service: Endoscopy;  Laterality: N/A;  . CYST REMOVAL NECK Left 04/13/2016   Procedure: EXCISION OF SEBACEOUS CYST LEFT POSTERIOR NECK;  Surgeon: Armandina Gemma, MD;  Location: [place];  Service: General;  Laterality: Left;  . Excision of Pelvic Absess, Right Ovary     2008  . RE-EXCISION OF BREAST CANCER,SUPERIOR MARGINS Left 08/31/2017   Procedure: RE-EXCISION OF LEFT  BREAST MARGINS ERAS PATHWAY;  Surgeon: Rolm Bookbinder, MD;  Location: [place];  Service: General;  Laterality: Left;  . TUBAL LIGATION  1980     Current Meds  Medication Sig  . Accu-Chek FastClix Lancets MISC 1 each by Does not apply route daily. Use to monitor glucose levels once per day; E11.9  . acetaminophen (TYLENOL) 500 MG tablet Take 1,000 mg by mouth every 6 (six) hours as needed.  . Albuterol Sulfate (PROAIR RESPICLICK) 712 (90 Base) MCG/ACT AEPB Inhale 1-2 puffs into the lungs every 6 (six) hours as needed (for wheezing/shortness of breath).  [name] amLODipine (NORVASC) 10 MG tablet Take 1 tablet (10 mg total) by mouth daily.  [name] anastrozole (ARIMIDEX) 1 MG tablet Take 1 tablet (1 mg total) by mouth daily.  [name]  atorvastatin (LIPITOR) 80 MG tablet Take 1 tablet (80 mg total) by mouth daily at 6 PM.  . Blood Glucose Monitoring Suppl (ACCU-CHEK AVIVA PLUS) w/Device KIT 1 Device by Does not apply route daily. Use to monitor glucose levels once per day; E11.9  . Cholecalciferol (VITAMIN D) 125 MCG (5000 UT) CAPS Take 1 capsule by mouth daily.  [name] denosumab (PROLIA) 60 MG/ML SOLN injection Inject 60 mg into the skin every 6 (six) months. Administer in upper arm, thigh, or abdomen  . glucose blood (ACCU-CHEK AVIVA PLUS) test strip 1 each by Other route daily. E11.9  . OXYGEN Inhale 2-3 L into the lungs continuous. When exerting self   . potassium chloride SA (K-DUR,KLOR-CON) 20 MEQ tablet Take 1 tablet (20 mEq total) by mouth 2 (two) times daily.  . SitaGLIPtin-MetFORMIN HCl (JANUMET XR) 50-1000 MG TB24 Take 1 tablet by mouth daily.  [name] spironolactone (ALDACTONE) 25 MG tablet Take 1 tablet (25 mg total) by mouth daily.  [name] thiamine (VITAMIN B-1) 100 MG tablet Take  1 tablet (100 mg total) by mouth every other day.  . TRELEGY ELLIPTA 100-62.5-25 MCG/INH AEPB INHALE 1 DOSE INTO THE LUNGS DAILY.     Allergies:   Benicar [olmesartan], [name] [valsartan], Hydrocodone-acetaminophen, Lisinopril, Monosodium glutamate, Codeine, Lead acetate, and Nickel   Social History   Tobacco Use  . Smoking status: Former Smoker    Packs/day: 1.00    Years: 50.00    Pack years: 50.00    Types: Cigarettes    Quit date: 04/16/2011    Years since quitting: 8.2  . Smokeless tobacco: Never Used  . Tobacco comment: quit that date when she had to go to ER   Substance Use Topics  . Alcohol use: Yes    Alcohol/week: 0.0 standard drinks    Comment: rare occasion  . Drug use: No    Comment: no marijuana since 2012     Family Hx: The patient's family history includes Alzheimer's disease in her brother and father; Breast cancer in her paternal grandmother; Diabetes in her brother; Heart disease in her mother; Hyperlipidemia in her  brother and brother; Hypertension in her brother, brother, and mother; Stroke (age of onset: 2) in her father.  ROS:   Please see the history of present illness.     All other systems reviewed and are negative.   Prior CV studies:   The following studies were reviewed today:  Coronary CTA 11/2018:  1. Left Main: No significant stenosis. FFR = 0.98.   2. LAD: No significant stenosis. Proximal FFR = 0.96, Mid FFR =  0.96, Distal FFR = 0.81. Diagonal: No significant stenosis. Proximal  diagonal = 0.95, mid diagonal 0.92, distal diagonal = 0.85.  3. LCX: No significant stenosis. Proximal FFR = 0.97, Distal FFR =  0.91.  4. Ramus: No significant stenosis. Proximal FFR = 0.99, Distal FFR =  0.94.  5. RCA: No significant stenosis. Proximal FFR = 0.99, Mid FFR =  0.95, PDA FFR = 0.91.   IMPRESSION:  1. CT FFR analysis showed no hemodynamically significant stenosis in  the LAD or RCA.   FFR:  1. Left Main:  No significant stenosis.  FFR = 0.98.  2. LAD: No significant stenosis. Proximal FFR = 0.96, Mid FFR = 0.96, Distal FFR = 0.81. Diagonal: No significant stenosis. Proximal diagonal = 0.95, mid diagonal 0.92, distal diagonal = 0.85. 3. LCX: No significant stenosis. Proximal FFR = 0.97, Distal FFR = 0.91. 4. Ramus: No significant stenosis. Proximal FFR = 0.99, Distal FFR = 0.94. 5. RCA: No significant stenosis. Proximal FFR = 0.99, Mid FFR = 0.95, PDA FFR = 0.91.  IMPRESSION: 1. CT FFR analysis showed no hemodynamically significant stenosis in the LAD or RCA.  Echocardiogram 10/2018: 1. The left ventricle has hyperdynamic systolic function, with an  ejection fraction of >65%. The cavity size was normal. There is moderately  increased left ventricular wall thickness. Left ventricular diastolic  Doppler parameters are consistent with  impaired relaxation. Elevated left ventricular end-diastolic pressure The  E/e' is >15. No evidence of left ventricular regional wall  motion  abnormalities.  2. The right ventricle has normal systolic function. The cavity was  normal. There is no increase in right ventricular wall thickness.  3. The mitral valve is abnormal. Mild thickening of the mitral valve  leaflet. There is mild mitral annular calcification present.  4. The tricuspid valve is grossly normal.  5. The aortic valve was not well visualized. Mild calcification of the  aortic valve. No stenosis of  the aortic valve.   Labs/Other Tests and Data Reviewed:    EKG:  An ECG dated 01/29/2019 was personally reviewed today and demonstrated:  sinus tachycardia with rate 108 bpm, no STE/D, no TWI; no significant change from previous  Recent Labs: 03/21/2019: ALT 8 06/20/2019: BUN 20; Creatinine, Ser 0.74; Hemoglobin 11.9; Magnesium 1.7; Platelets 246.0; Potassium 4.2; Sodium 137 06/26/2019: TSH 0.73   Recent Lipid Panel Lab Results  Component Value Date/Time   CHOL 149 01/08/2019 10:25 AM   TRIG 148 01/08/2019 10:25 AM   HDL 53 01/08/2019 10:25 AM   CHOLHDL 2.8 01/08/2019 10:25 AM   CHOLHDL 3 01/13/2017 04:46 PM   LDLCALC 66 01/08/2019 10:25 AM   LDLDIRECT 121.0 09/05/2014 04:49 PM    Wt Readings from Last 3 Encounters:  07/23/19 201 lb 6.4 oz (91.4 kg)  06/26/19 208 lb 3.2 oz (94.4 kg)  06/20/19 207 lb 2 oz (94 kg)     Objective:    Vital Signs:  BP 140/74   Pulse 81   Ht 5' 4.5" (1.638 m)   Wt 201 lb 6.4 oz (91.4 kg)   BMI 34.04 kg/m    VITAL SIGNS:  reviewed GEN:  no acute distress RESPIRATORY:  speaking in full sentences without SOB CARDIOVASCULAR:  no peripheral edema NEURO:  A&O x3 PSYCH:  normal affect  ASSESSMENT & PLAN:    1. Non-obstructive CAD: no anginal complaints.  - Start aspirin 57m daily - Continue aggressive risk factor modifications below: goal BP <130/80, LDL <70, and A1C <7  2. HTN: BP 156/80 today; typically in the 130s-140s/70s-80s She has allergies to ACEi/ARBs - Will start carvedilol 3.1246mBID  - Continue  amlodipine and spironolactone  3. HLD: LDL 66 12/2018 - Continue atorvastatin  4. DM type 2:  HgbA1C 6.3 05/2019 - Continue janumet per PCP  5. OSA: on CPAP - Continue CPAP - compliance encouraged.   COVID-19 Education: The signs and symptoms of COVID-19 were discussed with the patient and how to seek care for testing (follow up with PCP or arrange E-visit).  The importance of social distancing was discussed today.  Time:   Today, I have spent 33 minutes with the patient with telehealth technology discussing the above problems.     Medication Adjustments/Labs and Tests Ordered: Current medicines are reviewed at length with the patient today.  Concerns regarding medicines are outlined above.   Tests Ordered: No orders of the defined types were placed in this encounter.   Medication Changes: Meds ordered this encounter  Medications  . carvedilol (COREG) 3.125 MG tablet    Sig: Take 1 tablet (3.125 mg total) by mouth 2 (two) times daily.    Dispense:  180 tablet    Refill:  3  . aspirin EC 81 MG tablet    Sig: Take 1 tablet (81 mg total) by mouth daily.    Dispense:  90 tablet    Refill:  3    Follow Up:  In Person with Dr. KeClaiborne Billingsn 6 months  Signed, KrAbigail ButtsPA-C  07/23/2019 9:52 AM    [place]

## 2019-07-23 ENCOUNTER — Encounter: Payer: Self-pay | Admitting: Medical

## 2019-07-23 ENCOUNTER — Telehealth (INDEPENDENT_AMBULATORY_CARE_PROVIDER_SITE_OTHER): Payer: Medicare Other | Admitting: Medical

## 2019-07-23 ENCOUNTER — Telehealth: Payer: Self-pay

## 2019-07-23 VITALS — BP 140/74 | HR 81 | Ht 64.5 in | Wt 201.4 lb

## 2019-07-23 DIAGNOSIS — E118 Type 2 diabetes mellitus with unspecified complications: Secondary | ICD-10-CM | POA: Diagnosis not present

## 2019-07-23 DIAGNOSIS — E785 Hyperlipidemia, unspecified: Secondary | ICD-10-CM | POA: Diagnosis not present

## 2019-07-23 DIAGNOSIS — I10 Essential (primary) hypertension: Secondary | ICD-10-CM

## 2019-07-23 DIAGNOSIS — G4733 Obstructive sleep apnea (adult) (pediatric): Secondary | ICD-10-CM | POA: Diagnosis not present

## 2019-07-23 DIAGNOSIS — I2510 Atherosclerotic heart disease of native coronary artery without angina pectoris: Secondary | ICD-10-CM

## 2019-07-23 MED ORDER — CARVEDILOL 3.125 MG PO TABS: 3.1250 mg | ORAL_TABLET | Freq: Two times a day (BID) | ORAL | 3 refills | Status: DC

## 2019-07-23 MED ORDER — ASPIRIN EC 81 MG PO TBEC: 81.0000 mg | DELAYED_RELEASE_TABLET | Freq: Every day | ORAL | 3 refills | Status: DC

## 2019-07-23 NOTE — Telephone Encounter (Signed)
Called patient to discuss AVS instructions gave Victoria [name]'s recommendations and patient voiced understanding. AVS summary mailed to patient.    

## 2019-07-23 NOTE — Patient Instructions (Addendum)
Medication Instructions:   START CARVEDILOL (COREG) 3.125 mg 2 times a day  START ASPIRIN 81 mg daily  *If you need a refill on your cardiac medications before your next appointment, please call your pharmacy*  Lab Work: NONE ordered at this time of appointment   If you have labs (blood work) drawn today and your tests are completely normal, you will receive your results only by: [name] MyChart Message (if you have MyChart) OR . A paper copy in the mail If you have any lab test that is abnormal or we need to change your treatment, we will call you to review the results.  Testing/Procedures: NONE ordered at this time of appointment   Follow-Up: At [facility], you and your health needs are our priority.  As part of our continuing mission to provide you with exceptional heart care, we have created designated Provider Care Teams.  These Care Teams include your primary Cardiologist (physician) and Advanced Practice Providers (APPs -  Physician Assistants and Nurse Practitioners) who all work together to provide you with the care you need, when you need it.  We recommend signing up for the patient portal called "MyChart".  Sign up information is provided on this After Visit Summary.  MyChart is used to connect with patients for Virtual Visits (Telemedicine).  Patients are able to view lab/test results, encounter notes, upcoming appointments, etc.  Non-urgent messages can be sent to your provider as well.   To learn more about what you can do with MyChart, go to [email or web address].    Your next appointment:   6 month(s)  The format for your next appointment:   In Person  Provider:   Shelva Majestic, MD  Other Instructions  Continue Low Salt/Sodium diet  Continue to monitor blood pressure. Call our office if your BP is persistently >130/80

## 2019-07-24 ENCOUNTER — Ambulatory Visit: Payer: Medicare Other | Admitting: Endocrinology

## 2019-07-24 ENCOUNTER — Telehealth: Payer: Self-pay

## 2019-07-24 ENCOUNTER — Other Ambulatory Visit: Payer: Self-pay

## 2019-07-24 ENCOUNTER — Encounter: Payer: Self-pay | Admitting: Endocrinology

## 2019-07-24 VITALS — BP 112/62 | HR 78 | Ht 64.5 in | Wt 210.8 lb

## 2019-07-24 DIAGNOSIS — E118 Type 2 diabetes mellitus with unspecified complications: Secondary | ICD-10-CM | POA: Diagnosis not present

## 2019-07-24 LAB — POCT GLYCOSYLATED HEMOGLOBIN (HGB A1C): Hemoglobin A1C: 6.8 % — AB (ref 4.0–5.6)

## 2019-07-24 LAB — T4, FREE: Free T4: 0.77 ng/dL (ref 0.60–1.60)

## 2019-07-24 LAB — TSH: TSH: 1.82 u[IU]/mL (ref 0.35–4.50)

## 2019-07-24 NOTE — Telephone Encounter (Signed)
LAB RESULTS  Lab results were reviewed by Dr. Ellison. A letter has been mailed to pt home address. For future reference, letter can be found in Epic. 

## 2019-07-24 NOTE — Progress Notes (Signed)
Subjective:    Patient ID: Victoria [name], female    DOB: 1945/10/28, 74 y.o.   MRN: [id number]  HPI Pt returns for f/u of diabetes mellitus: DM type: 2 Dx'ed: 7253 Complications: CAD and PAD Therapy: Janumet GDM: never DKA: never Severe hypoglycemia: never Pancreatitis: never Pancreatic imaging: normal on 2008 CT.   Other: edema limits rx options; fructosamine showed better glycemic control than A1c.   Interval history: no recent steroids; She says cbg's vary from 111-130.  Pt also has hyperthyroidism (dx'ed 2019; US showed MNG; bx of RLL and RML nodules in 2019 showed beth cat 2; she had RAI 11/20).  pt states she feels well in general.  Past Medical History:  Diagnosis Date  . Abdominal aneurysm ([place])    Dr. Oneida Alar follows lLOV 2 ''17 per pt "around 2 cm"  . Anemia    as a child  . Arthritis    left ankle, right knee, right SI joint, wrists, lower back  . Breast cancer in female [facility])    Left  . COPD (chronic obstructive pulmonary disease) ([place])    ephysema-Dr. Chase Caller  . Dyspnea   . Headache    as a child would have terrible headaches during season changes  . 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 Hyperlipidemia   . Hypertension   . Multiple thyroid nodules   . Murmur, cardiac 1950  . Osteoporosis   . Pneumonia   . Pre-diabetes   . Requires continuous at home supplemental oxygen    2 L 24/7  . Sebaceous cyst    hairline sebaceous cyst left posterior neck to be excised 04-13-16 by Dr. Harlow Asa in [place] [facility]  . Sleep apnea    cpap used sometimes, uses oxygen concentrator 2 l/m nasally bedtime  . Varicella as child    Past Surgical History:  Procedure Laterality Date  . APPENDECTOMY     2008  . BREAST LUMPECTOMY Left 08/31/2017  . BREAST LUMPECTOMY WITH RADIOACTIVE SEED LOCALIZATION Left 08/10/2017   Procedure: BREAST LUMPECTOMY WITH RADIOACTIVE SEED LOCALIZATION;  Surgeon: Rolm Bookbinder, MD;  Location: [facility];  Service: General;   Laterality: Left;  . [place]  . COLONOSCOPY    . COLONOSCOPY WITH PROPOFOL N/A 04/16/2016   Procedure: COLONOSCOPY WITH PROPOFOL;  Surgeon: Carol Ada, MD;  Location: [facility];  Service: Endoscopy;  Laterality: N/A;  . CYST REMOVAL NECK Left 04/13/2016   Procedure: EXCISION OF SEBACEOUS CYST LEFT POSTERIOR NECK;  Surgeon: Armandina Gemma, MD;  Location: [facility];  Service: General;  Laterality: Left;  . Excision of Pelvic Absess, Right Ovary     2008  . RE-EXCISION OF BREAST CANCER,SUPERIOR MARGINS Left 08/31/2017   Procedure: RE-EXCISION OF LEFT  BREAST MARGINS ERAS PATHWAY;  Surgeon: Rolm Bookbinder, MD;  Location: [facility];  Service: General;  Laterality: Left;  . TUBAL LIGATION  1980    Social History   Socioeconomic History  . Marital status: Divorced    Spouse name: Not on file  . Number of children: 1  . Years of education: 47  . Highest education level: Not on file  Occupational History  . Occupation: Social worker, Technical sales engineer: [employer]  Tobacco Use  . Smoking status: Former Smoker    Packs/day: 1.00    Years: 50.00    Pack years: 50.00    Types: Cigarettes    Quit date: 04/16/2011    Years since  quitting: 8.2  . Smokeless tobacco: Never Used  . Tobacco comment: quit that date when she had to go to ER   Substance and Sexual Activity  . Alcohol use: Yes    Alcohol/week: 0.0 standard drinks    Comment: rare occasion  . Drug use: No    Comment: no marijuana since 2012  . Sexual activity: Not Currently  Other Topics Concern  . Not on file  Social History Narrative   HSG; [name], [place]. . Married '69 - 9 yrs/divorced. 1 son - '72; no grandchildren.   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 No h/o physical or sexual abuse. ACP - no living will - wants information. Provided packet of information. On 08/05/2011: she stated she was distant cousins to celebrities [name] and  [name]      Social Determinants of Health   Financial Resource Strain:   . Difficulty of Paying Living Expenses:   Food Insecurity:   . Worried About Charity fundraiser in the Last Year:   . Arboriculturist in the Last Year:   Transportation Needs:   . Film/video editor (Medical):   [name] Lack of Transportation (Non-Medical):   Physical Activity:   . Days of Exercise per Week:   . Minutes of Exercise per Session:   Stress:   . Feeling of Stress :   Social Connections:   . Frequency of Communication with Friends and Family:   . Frequency of Social Gatherings with Friends and Family:   . Attends Religious Services:   . Active Member of Clubs or Organizations:   . Attends Archivist Meetings:   [name] Marital Status:   Intimate Partner Violence:   . Fear of Current or Ex-Partner:   . Emotionally Abused:   [name] Physically Abused:   . Sexually Abused:     Current Outpatient Medications on File Prior to Visit  Medication Sig Dispense Refill  . Accu-Chek FastClix Lancets MISC 1 each by Does not apply route daily. Use to monitor glucose levels once per day; E11.9 102 each 2  . acetaminophen (TYLENOL) 500 MG tablet Take 1,000 mg by mouth every 6 (six) hours as needed.    . Albuterol Sulfate (PROAIR RESPICLICK) 315 (90 Base) MCG/ACT AEPB Inhale 1-2 puffs into the lungs every 6 (six) hours as needed (for wheezing/shortness of breath). 1 each 0  . amLODipine (NORVASC) 10 MG tablet Take 1 tablet (10 mg total) by mouth daily. 90 tablet 1  . anastrozole (ARIMIDEX) 1 MG tablet Take 1 tablet (1 mg total) by mouth daily. 90 tablet 4  . aspirin EC 81 MG tablet Take 1 tablet (81 mg total) by mouth daily. 90 tablet 3  . atorvastatin (LIPITOR) 80 MG tablet Take 1 tablet (80 mg total) by mouth daily at 6 PM. 90 tablet 3  . Blood Glucose Monitoring Suppl (ACCU-CHEK AVIVA PLUS) w/Device KIT 1 Device by Does not apply route daily. Use to monitor glucose levels once per day; E11.9 1 kit 0  .  carvedilol (COREG) 3.125 MG tablet Take 1 tablet (3.125 mg total) by mouth 2 (two) times daily. 180 tablet 3  . Cholecalciferol (VITAMIN D) 125 MCG (5000 UT) CAPS Take 1 capsule by mouth daily.    [name] DALIRESP 500 MCG TABS tablet Take 1 tablet by mouth once daily 90 tablet 1  . denosumab (PROLIA) 60 MG/ML SOLN injection Inject 60 mg into the skin every 6 (six) months.  Administer in upper arm, thigh, or abdomen    . glucose blood (ACCU-CHEK AVIVA PLUS) test strip 1 each by Other route daily. E11.9 100 each 0  . OXYGEN Inhale 2-3 L into the lungs continuous. When exerting self     . potassium chloride SA (K-DUR,KLOR-CON) 20 MEQ tablet Take 1 tablet (20 mEq total) by mouth 2 (two) times daily. 180 tablet 1  . SitaGLIPtin-MetFORMIN HCl (JANUMET XR) 50-1000 MG TB24 Take 1 tablet by mouth daily. 180 tablet 3  . spironolactone (ALDACTONE) 25 MG tablet Take 1 tablet (25 mg total) by mouth daily. 90 tablet 1  . thiamine (VITAMIN B-1) 100 MG tablet Take 1 tablet (100 mg total) by mouth every other day. 45 tablet 1  . TRELEGY ELLIPTA 100-62.5-25 MCG/INH AEPB INHALE 1 DOSE INTO THE LUNGS DAILY. 60 each 5   No current facility-administered medications on file prior to visit.    Allergies  Allergen Reactions  . Benicar [Olmesartan] Swelling    Swelling of face and arms   . [name] [Valsartan] Swelling    Swelling of face and arms   . Hydrocodone-Acetaminophen Nausea And Vomiting    Severe vomiting  . Lisinopril Cough  . Monosodium Glutamate Other (See Comments)    Facial swelling per pt  . Codeine Other (See Comments)    jittery  . Lead Acetate Rash  . Nickel Rash    Severe rash to infection: pt is allergic to all metals other than sterling silver or gold jewelry.     Family History  Problem Relation Age of Onset  . Heart disease Mother        MI - fatal  . Hypertension Mother   . Stroke Father 12       fatal  . Alzheimer's disease Father   . Alzheimer's disease Brother   . Hyperlipidemia  Brother   . Hypertension Brother   . Diabetes Brother   . Hypertension Brother   . Hyperlipidemia Brother   . Breast cancer Paternal Grandmother     BP 112/62 (BP Location: Left Arm, Patient Position: Sitting, Cuff Size: Large)   Pulse 78   Ht 5' 4.5" (1.638 m)   Wt 210 lb 12.8 oz (95.6 kg)   SpO2 100% Comment: 3L O2  BMI 35.62 kg/m    Review of Systems She denies hypoglycemia.      Objective:   Physical Exam VITAL SIGNS:  See vs page GENERAL: no distress Pulses: dorsalis pedis intact bilat.   MSK: no deformity of the feet CV: no leg edema.   Skin:  no ulcer on the feet.  normal color and temp on the feet. Neuro: sensation is intact to touch on the feet.    Lab Results  Component Value Date   HGBA1C 6.8 (A) 07/24/2019   Lab Results  Component Value Date   TSH 1.82 07/24/2019   T3TOTAL 94.0 10/14/2015   T4TOTAL 7.8 04/19/2019      Assessment & Plan:  Hyperthyroidism: well-controlled after RAI rx Type 2 DM: well-controlled Edema: better today, but it still limits rx options  Patient Instructions  Blood tests are requested for you today.  We'll let you know about the results.  Please continue the same Janumet.   Please come back for a follow-up appointment in 1 month.

## 2019-07-24 NOTE — Patient Instructions (Signed)
Blood tests are requested for you today.  We'll let you know about the results.  Please continue the same Janumet.   Please come back for a follow-up appointment in 1 month.

## 2019-07-24 NOTE — Telephone Encounter (Signed)
-----   Message from Renato Shin, MD sent at 07/24/2019  4:13 PM EST ----- please contact patient: Normal again--good  I'll see you next time.

## 2019-07-25 ENCOUNTER — Ambulatory Visit: Payer: Medicare Other | Admitting: Internal Medicine

## 2019-07-25 ENCOUNTER — Encounter: Payer: Self-pay | Admitting: *Deleted

## 2019-07-25 ENCOUNTER — Encounter: Payer: Self-pay | Admitting: Internal Medicine

## 2019-07-25 VITALS — BP 128/78 | HR 75 | Ht 64.5 in | Wt 210.8 lb

## 2019-07-25 DIAGNOSIS — Z560 Unemployment, unspecified: Secondary | ICD-10-CM

## 2019-07-25 DIAGNOSIS — Z129 Encounter for screening for malignant neoplasm, site unspecified: Secondary | ICD-10-CM

## 2019-07-25 DIAGNOSIS — J9611 Chronic respiratory failure with hypoxia: Secondary | ICD-10-CM

## 2019-07-25 DIAGNOSIS — Z9189 Other specified personal risk factors, not elsewhere classified: Secondary | ICD-10-CM

## 2019-07-25 DIAGNOSIS — J449 Chronic obstructive pulmonary disease, unspecified: Secondary | ICD-10-CM | POA: Diagnosis not present

## 2019-07-25 NOTE — Patient Instructions (Addendum)
COPD, severe ([place]) Chronic respiratory failure with hypoxia ([facility])  -Stable disease  Plan -Continue oxygen, Trelegy and Daliresp as before. -Check a pulse ox on room air at rest at this moment and CMA to document it -At next visit turn off oxygen for 15 minutes and check pulse ox on room air with exertion.   Cancer screening  -No evidence of lung cancer in August 2020 low-dose CT scan of the chest  Plan -Repeat CT scan of the chest sometime between August 2021 and October 2021  At increased risk of exposure to COVID-19 virus Out of work - new issue  -Even though you have had the Covid vaccine and the CDC guidelines as of yesterday advised that people who have within the family can gather indoors without a mask as long as they vaccinated, I think you should stay away from work for the next few months till your client is also fully vaccinated and other people at work are vaccinated given the fact the people at work do not belong to your regular safety bubble  -Reassess risk from getting COVID-19 at the workplace in a few months by returning for follow-up  -We will do a letter stating the above  Follow-up -2-3  months with nurse practitioner to reassess safe return to work

## 2019-07-25 NOTE — Progress Notes (Signed)
OV 07/20/2017  Chief Complaint  Patient presents with  . Follow-up    Follow up visit. Pt also needing surgical clearance for lumpectomy.  Pt saw RB beginning February annd was givien prednisone. Pt states breathing has been tolerable with not taking prednisone. DME: [employer], 2L continuous at home 3L pulse when out     Follow-up severe COPD patient with obesity and sleep apnea.  She says she is compliant with all her COPD inhalers.  She is also compliant with her CPAP.  She is not smoking anymore.  Overall COPD CAT score is 16.  She uses oxygen all the time and CPAP at night.  She is due for breast surgery lumpectomy and is here for a preoperative evaluation.  She tells me that she is in good nutritional status.  She is not anemic.  She has got good kidney function.  She says the surgery will be elective and less than 2 hours and does not involve operating on the neck or the chest or the abdomen.  It will be done at [facility] and under experience surgeon.  Overall she is very functional and active despite his severe COPD.   OV 09/14/2017  Chief Complaint  Patient presents with  . Follow-up    Pt states she has been doing better since her visit with Dr. Halford Chessman last week. Pt is currently taking an abx and just finished last dose of prednisone today. Pt's cough has become better and is still wheezing some. SOB is about the same.    Follow-up advanced COPD with obesity and sleep apnea and chronic hypoxemic respiratory failure  I last saw her in March 2019 which itself followed in exacerbation.  Since then she has had 2 visits in my office with 2 providers for COPD exacerbation.  Most recently September 09, 2017.  Chest x-ray reviewed but did not visualize and the x-ray is clear.  [Of note she had low-dose CT scan of the chest May 2018 and is due for one-year CT scan coming up anytime now] her baseline COPD CAT scan.  She is using oxygen between 2-3 L.  She feels her exacerbation is slowly  resolving but she is still symptomatic COPD CAT score is 24 and much worse than baseline although this is improved compared to last week.  She did have breast cancer surgery recently and this went well.  Currently breast cancer in remission.  Review of her medication shows that while she was in the hospital nebulized bronchodilator long-acting beta agonist was tried and she really liked it.  Therefore on September 09, 2017 visit her combination inhaler Stiolto was changed to [place] she is here to start this.and Yupelri.  Review of her chart shows that she is not on inhaled corticosteroid.  Sometime back she was on Arnuity but she is not taking it due to lack of perceived benefit.  But now she is having recurrent exacerbations.  Therefore she is open to starting it again     OV 02/09/2018  Subjective:  Patient ID: Victoria [name], female , DOB: 12/26/1945 , age 80 y.o. , MRN: [id number] , ADDRESS: [address] [place] [id number]   02/09/2018 -   Chief Complaint  Patient presents with  . Follow-up    Pt states she has been okay since last visit. States she is still having problems becoming SOB when she exerts herself and has occ cough when she has the SOB. Denies any complaints of wheezing  or CP. Pt does develop chest tightness when she has problems with SOB.     HPI Victoria [name] 74 y.o. -with advanced COPD and chronic hypoxemic respiratory failure.  Presents for routine follow-up.  Few weeks ago she had an exacerbation recall an antibiotic and prednisone.  After that she is improved back to her baseline with COPD CAT score shown below 15 and the symptomatology described.  She has new onset of pedal edema for the last 3 days.  This is associated with increased dietary intake of soda and also salted chips.  There is no fever or rash or wheezing.  Her COPD medications include nebulizers.  She was changed to the sometime back by nurse practitioner.  She is asking about going back to inhaler  particularly Stiolto.  She is willing to try Trelegy         OV 07/20/2018  Subjective:  Patient ID: Victoria [name], female , DOB: [birth year] , age 74 y.o. , MRN: [id number] , ADDRESS: [address] [place] [phone]   07/20/2018 -   Chief Complaint  Patient presents with  . Follow-up    Pt states her breathing has become worse since last visit, has an occ dry cough, and also has some tightness in chest.   Follow-up) COPD with chronic hypoxemic respiratory failure.  On Trelegy, oxygen 2 L and Daliresp.  HPI Victoria [name] 74 y.o. -presents for routine follow-up.  Since last seeing me he saw a nurse practitioner for follow-up.  She tells me for the last 3 days she is has increased shortness of breath and increased chest tightness and wheezing but no increase in cough or sputum production.  This fits in with a slight increase in COPD CAT score to 18.  She takes 2 L of oxygen at home and 3 L with a portable system.  She continues on Daliresp without problems and also Trelegy inhaler.  Viral exposure or travel history -   Is negative.  Last CT scan of the chest was in June 2019 showed only emphysema.  This was for lung cancer screening.  Last pulmonary function test was in 2017.  Most recent blood work reviewed was in October 2019 and normal.   Also c/p bad knees and morning stiffness and arthralgia/arthritis for long tme. No dx opf RA     OV 01/17/2019  Subjective:  Patient ID: Victoria [name], female , DOB: [birth year] , age 26 y.o. , MRN: [id number] , ADDRESS: [place] [phone]  Follow-up) COPD with chronic hypoxemic respiratory failure.  On Trelegy, oxygen 2 L and Daliresp.  01/17/2019 -  Copd followup   HPI Victoria [name] 74 y.o. -returns for follow-up of advanced COPD.  Overall she is stable.  She is doing good with social distancing.  She has not had any COPD exacerbations.  She is having some dryness of the nose with the oxygen for the last few  days and this is causing burning and irritation.  She tried some saline nasal spray yesterday and that seems to help.  She had a low-dose CT scan for lung cancer screening and her lung nodules are stable.  Repeat CT scan is recommended in 1 year.  She is compliant with her oxygen, Trelegy and Daliresp.  This combination is helping her.  COPD CAT score is 16.  She is having some economic difficulties because of the pandemic but otherwise is doing okay.    IMPRESSION: 1. Lung-RADS 2, benign  appearance or behavior. Continue annual screening with low-dose chest CT without contrast in 12 months. 2. Aortic atherosclerosis (ICD10-I70.0), coronary artery atherosclerosis and emphysema (ICD10-J43.9). 3. Similar left breast nodule of 8 mm. The patient had a negative left-sided diagnostic mammogram on 12/04/2018. 4. Similar left adrenal adenomas.   Electronically Signed   By: Abigail Miyamoto M.D.   On: 01/10/2019 14:34  ROS - per HPI   OV 07/25/2019  Subjective:  Patient ID: Victoria [name], female , DOB: [birth year] , age 21 y.o. , MRN: [id number] , ADDRESS: [place] [address]   07/25/2019 -   Chief Complaint  Patient presents with  . Follow-up    Pt states she has been doing okay since last visit. Pt states she still becomes SOB with activities but states breathing is stable.   Follow-up) COPD with chronic hypoxemic respiratory failure.  On Trelegy, oxygen 2 L and Daliresp.  HPI Victoria [name] 74 y.o. -presents for follow-up of her COPD and chronic hypoxic respiratory failure.  She tells me that recently she has been forgetting to use her oxygen while at rest at home a few hours later when she checks her pulse ox is 93%.  She has been surprised by how good this is.  Overall COPD stable.  When she exerts herself she feels she needs 3 L because she gets short of breath although she is not checked her pulse ox with exertion.  She is on Trelegy and Daliresp and oxygen  24/7.  Cancer screening: Last CT scan of the chest August 2020 and this was without any evidence of lung cancer.  Follow-up in 1 year is recommended.  Out of work: I did a letter for her on the assumption she wanted to go back to work.  However she tells me that she is very afraid to go back to work.  This is because she feels she will get COVID-19 at work.  So she wants me to redo this letter.  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  At this point in time patient is vaccinated but her client is not vaccinated fully.  Declined second dose is pending at the end of this month 2021.  There are other people at work or not vaccinated.  She has managed to avoid getting Covid by staying within her safety bubble, social distancing and wearing a mask.  CDC guidelines yesterday indicate that vaccinated people can gather indoors with other vaccinated people without a mask.  In my interpretation of the CDC guidelines it appears this is more relevant to family situation as opposed to work.  She has advanced COPD and she could be at an extremely high risk of death if she were to get COVID-19.  Therefore I would support her request to stay off work for a few more months till further guidelines from [facility] regarding work-up delineated and also more people in the community are vaccinated.  Noted: She has not been to work in a year.   CAT Score 07/25/2019 09/14/2017  Total CAT Score 12 24      CAT COPD Symptom & Quality of Life Score (GSK trademark) 0 is no burden. 5 is highest burden 07/20/2017  09/14/2017  02/09/2018  07/20/2018  01/17/2019   Never Cough -> Cough all the time '2 3 2 2 2  ' No phlegm in chest -> Chest is full of phlegm 1 2 2  2 1  No chest tightness -> Chest feels very tight '2 3 2 2 1  ' No dyspnea for 1 flight stairs/hill -> Very dyspneic for 1 flight of stairs '4 5 4 3 3  ' No limitations for ADL at home -> Very limited  with ADL at home '2 3 2 3 2  ' Confident leaving home -> Not at all confident leaving home 0 3 0 2 3  Sleep soundly -> Do not sleep soundly because of lung condition '2 2 1 1 2  ' Lots of Energy -> No energy at all '3 3 2 3 2  ' TOTAL Score (max 40)  '16 24 15 18 16     ' Results for SOSIE, GATO (MRN [id number]) as of 07/20/2017 10:59  Ref. Range 04/15/2016 13:02  FEV1-Post Latest Units: L 0.65  FEV1-%Pred-Post Latest Units: % 34  FEV1-%Change-Post Latest Units: % 1   Results for HAYZEL, RUBERG (MRN [id number]) as of 07/20/2017 10:59  Ref. Range 04/15/2016 13:02  DLCO cor Latest Units: ml/min/mmHg 9.47  DLCO cor % pred Latest Units: % 37    ROS - per HPI     has a past medical history of Abdominal aneurysm ([place]), Anemia, Arthritis, Breast cancer in female [facility]), COPD (chronic obstructive pulmonary disease) ([place]), Dyspnea, Headache, Heart murmur, Hyperlipidemia, Hypertension, Multiple thyroid nodules, Murmur, cardiac (1950), Osteoporosis, Pneumonia, Pre-diabetes, Requires continuous at home supplemental oxygen, Sebaceous cyst, Sleep apnea, and Varicella (as child).   reports that she quit smoking about 8 years ago. Her smoking use included cigarettes. She has a 50.00 pack-year smoking history. She has never used smokeless tobacco.  Past Surgical History:  Procedure Laterality Date  . APPENDECTOMY     2008  . BREAST LUMPECTOMY Left 08/31/2017  . BREAST LUMPECTOMY WITH RADIOACTIVE SEED LOCALIZATION Left 08/10/2017   Procedure: BREAST LUMPECTOMY WITH RADIOACTIVE SEED LOCALIZATION;  Surgeon: Rolm Bookbinder, MD;  Location: [facility];  Service: General;  Laterality: Left;  . [place]  . COLONOSCOPY    . COLONOSCOPY WITH PROPOFOL N/A 04/16/2016   Procedure: COLONOSCOPY WITH PROPOFOL;  Surgeon: Carol Ada, MD;  Location: [facility];  Service: Endoscopy;  Laterality: N/A;  . CYST REMOVAL NECK Left 04/13/2016   Procedure: EXCISION OF SEBACEOUS CYST LEFT POSTERIOR NECK;  Surgeon:  Armandina Gemma, MD;  Location: [facility];  Service: General;  Laterality: Left;  . Excision of Pelvic Absess, Right Ovary     2008  . RE-EXCISION OF BREAST CANCER,SUPERIOR MARGINS Left 08/31/2017   Procedure: RE-EXCISION OF LEFT  BREAST MARGINS ERAS PATHWAY;  Surgeon: Rolm Bookbinder, MD;  Location: [facility];  Service: General;  Laterality: Left;  . TUBAL LIGATION  1980    Allergies  Allergen Reactions  . Benicar [Olmesartan] Swelling    Swelling of face and arms   . [name] [Valsartan] Swelling    Swelling of face and arms   . Hydrocodone-Acetaminophen Nausea And Vomiting    Severe vomiting  . Lisinopril Cough  . Monosodium Glutamate Other (See Comments)    Facial swelling per pt  . Codeine Other (See Comments)    jittery  . Lead Acetate Rash  . Nickel Rash    Severe rash to infection: pt is allergic to all metals other than sterling silver or gold jewelry.     Immunization History  Administered Date(s) Administered  . Fluad Quad(high Dose 65+) 01/17/2019  . Influenza Split 01/16/2011  . Influenza, High Dose Seasonal PF 01/13/2017, 02/09/2018  . Influenza,inj,Quad PF,6+  Mos 02/01/2013, 02/27/2014, 04/07/2015, 01/08/2016  . PFIZER SARS-COV-2 Vaccination 06/06/2019, 06/27/2019  . Pneumococcal Conjugate-13 02/01/2013  . Pneumococcal Polysaccharide-23 10/29/2014  . Td 12/06/2011    Family History  Problem Relation Age of Onset  . Heart disease Mother        MI - fatal  . Hypertension Mother   . Stroke Father 9       fatal  . Alzheimer's disease Father   . Alzheimer's disease Brother   . Hyperlipidemia Brother   . Hypertension Brother   . Diabetes Brother   . Hypertension Brother   . Hyperlipidemia Brother   . Breast cancer Paternal Grandmother      Current Outpatient Medications:  .  Accu-Chek FastClix Lancets MISC, 1 each by Does not apply route daily. Use to monitor glucose levels once per day; E11.9, Disp: 102 each, Rfl: 2 .  acetaminophen (TYLENOL) 500 MG tablet,  Take 1,000 mg by mouth every 6 (six) hours as needed., Disp: , Rfl:  .  Albuterol Sulfate (PROAIR RESPICLICK) 421 (90 Base) MCG/ACT AEPB, Inhale 1-2 puffs into the lungs every 6 (six) hours as needed (for wheezing/shortness of breath)., Disp: 1 each, Rfl: 0 .  amLODipine (NORVASC) 10 MG tablet, Take 1 tablet (10 mg total) by mouth daily., Disp: 90 tablet, Rfl: 1 .  anastrozole (ARIMIDEX) 1 MG tablet, Take 1 tablet (1 mg total) by mouth daily., Disp: 90 tablet, Rfl: 4 .  aspirin EC 81 MG tablet, Take 1 tablet (81 mg total) by mouth daily., Disp: 90 tablet, Rfl: 3 .  atorvastatin (LIPITOR) 80 MG tablet, Take 1 tablet (80 mg total) by mouth daily at 6 PM., Disp: 90 tablet, Rfl: 3 .  Blood Glucose Monitoring Suppl (ACCU-CHEK AVIVA PLUS) w/Device KIT, 1 Device by Does not apply route daily. Use to monitor glucose levels once per day; E11.9, Disp: 1 kit, Rfl: 0 .  carvedilol (COREG) 3.125 MG tablet, Take 1 tablet (3.125 mg total) by mouth 2 (two) times daily., Disp: 180 tablet, Rfl: 3 .  Cholecalciferol (VITAMIN D) 125 MCG (5000 UT) CAPS, Take 1 capsule by mouth daily., Disp: , Rfl:  .  DALIRESP 500 MCG TABS tablet, Take 1 tablet by mouth once daily, Disp: 90 tablet, Rfl: 1 .  denosumab (PROLIA) 60 MG/ML SOLN injection, Inject 60 mg into the skin every 6 (six) months. Administer in upper arm, thigh, or abdomen, Disp: , Rfl:  .  glucose blood (ACCU-CHEK AVIVA PLUS) test strip, 1 each by Other route daily. E11.9, Disp: 100 each, Rfl: 0 .  OXYGEN, Inhale 2-3 L into the lungs continuous. When exerting self , Disp: , Rfl:  .  potassium chloride SA (K-DUR,KLOR-CON) 20 MEQ tablet, Take 1 tablet (20 mEq total) by mouth 2 (two) times daily., Disp: 180 tablet, Rfl: 1 .  SitaGLIPtin-MetFORMIN HCl (JANUMET XR) 50-1000 MG TB24, Take 1 tablet by mouth daily., Disp: 180 tablet, Rfl: 3 .  spironolactone (ALDACTONE) 25 MG tablet, Take 1 tablet (25 mg total) by mouth daily., Disp: 90 tablet, Rfl: 1 .  thiamine (VITAMIN B-1)  100 MG tablet, Take 1 tablet (100 mg total) by mouth every other day., Disp: 45 tablet, Rfl: 1 .  TRELEGY ELLIPTA 100-62.5-25 MCG/INH AEPB, INHALE 1 DOSE INTO THE LUNGS DAILY., Disp: 60 each, Rfl: 5      Objective:   Vitals:   07/25/19 0941  BP: 128/78  Pulse: 75  SpO2: 100%  Weight: 210 lb 12.8 oz (95.6 kg)  Height: 5' 4.5" (  1.638 m)  100% is on 3 L  Estimated body mass index is 35.62 kg/m as calculated from the following:   Height as of this encounter: 5' 4.5" (1.638 m).   Weight as of this encounter: 210 lb 12.8 oz (95.6 kg).  '@WEIGHTCHANGE' @  [employer]   07/25/19 0941  Weight: 210 lb 12.8 oz (95.6 kg)     Physical Exam  Obese female sitting in a wheelchair.  She is using the wheelchair because of back reasons.  Clear to auscultation bilaterally overall diminished air entry normal heart sounds.  Oral cavity is free from thrush.  Normal heart sounds abdomen soft and obese.  No cyanosis no clubbing no edema.  Alert and oriented x3.        Assessment:       ICD-10-CM   1. COPD, severe ([place])  J44.9   2. Chronic respiratory failure with hypoxia ([facility])  J96.11   3. Cancer screening  Z12.9   4. At increased risk of exposure to COVID-19 virus  Z91.89   5. Out of work  Z56.0        Plan:     Patient Instructions  COPD, severe ([place]) Chronic respiratory failure with hypoxia ([place])  -Stable disease  Plan -Continue oxygen, Trelegy and Daliresp as before. -Check a pulse ox on room air at rest at this moment and CMA to document it -At next visit turn off oxygen for 15 minutes and check pulse ox on room air with exertion.   Cancer screening  -No evidence of lung cancer in August 2020 low-dose CT scan of the chest  Plan -Repeat CT scan of the chest sometime between August 2021 and October 2021  At increased risk of exposure to COVID-19 virus Out of work  -Even though you have had the Covid vaccine and the CDC guidelines as of yesterday advised that people who  have within the family can gather indoors without a mask as long as they vaccinated, I think you should stay away from work for the next few months till your client is also fully vaccinated and other people at work are vaccinated given the fact the people at work do not belong to your regular safety bubble  -Reassess risk from getting COVID-19 at the workplace in a few months by returning for follow-up  -We will do a letter stating the above  Follow-up -2-3  months with nurse practitioner to reassess safe return to work     SIGNATURE    Dr. Brand Males, M.D., F.C.C.P,  Pulmonary and Critical Care Medicine Staff Physician, [place] Director - Interstitial Lung Disease  Program  Pulmonary [place] at [place], [place], [address]  Pager: [phone], If no answer or between  15:00h - 7:00h: call [phone] Telephone: [phone]  10:19 AM 07/25/2019

## 2019-08-02 DIAGNOSIS — Z779 Other contact with and (suspected) exposures hazardous to health: Secondary | ICD-10-CM | POA: Diagnosis not present

## 2019-08-12 DIAGNOSIS — J449 Chronic obstructive pulmonary disease, unspecified: Secondary | ICD-10-CM | POA: Diagnosis not present

## 2019-08-22 ENCOUNTER — Other Ambulatory Visit: Payer: Self-pay

## 2019-08-24 ENCOUNTER — Encounter: Payer: Self-pay | Admitting: Endocrinology

## 2019-08-24 ENCOUNTER — Other Ambulatory Visit: Payer: Self-pay

## 2019-08-24 ENCOUNTER — Ambulatory Visit (INDEPENDENT_AMBULATORY_CARE_PROVIDER_SITE_OTHER): Payer: Medicare Other | Admitting: Endocrinology

## 2019-08-24 VITALS — BP 138/70 | HR 89 | Ht 64.5 in | Wt 214.2 lb

## 2019-08-24 DIAGNOSIS — E0590 Thyrotoxicosis, unspecified without thyrotoxic crisis or storm: Secondary | ICD-10-CM

## 2019-08-24 NOTE — Progress Notes (Signed)
Subjective:    Patient ID: Victoria [name], female    DOB: [birth year], 74 y.o.   MRN: [id number]  HPI Pt returns for f/u of diabetes mellitus: DM type: 2 Dx'ed: 8177 Complications: CAD and PAD Therapy: Janumet GDM: never DKA: never Severe hypoglycemia: never Pancreatitis: never Pancreatic imaging: normal on 2008 CT.   Other: edema limits rx options; fructosamine showed better glycemic control than A1c.   Interval history: no change in chronic doe  Pt also has hyperthyroidism (dx'ed 2019; US showed MNG; bx of RLL and RML nodules in 2019 showed beth cat 2; she had RAI 11/20).   Past Medical History:  Diagnosis Date  . Abdominal aneurysm ([place])    Dr. Oneida Alar follows lLOV 2 ''17 per pt "around 2 cm"  . Anemia    as a child  . Arthritis    left ankle, right knee, right SI joint, wrists, lower back  . Breast cancer in female [facility])    Left  . COPD (chronic obstructive pulmonary disease) ([place])    ephysema-Dr. Chase Caller  . Dyspnea   . Headache    as a child would have terrible headaches during season changes  . 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 Hyperlipidemia   . Hypertension   . Multiple thyroid nodules   . Murmur, cardiac 1950  . Osteoporosis   . Pneumonia   . Pre-diabetes   . Requires continuous at home supplemental oxygen    2 L 24/7  . Sebaceous cyst    hairline sebaceous cyst left posterior neck to be excised 04-13-16 by Dr. Harlow Asa in [place] [facility]  . Sleep apnea    cpap used sometimes, uses oxygen concentrator 2 l/m nasally bedtime  . Varicella as child    Past Surgical History:  Procedure Laterality Date  . APPENDECTOMY     2008  . BREAST LUMPECTOMY Left 08/31/2017  . BREAST LUMPECTOMY WITH RADIOACTIVE SEED LOCALIZATION Left 08/10/2017   Procedure: BREAST LUMPECTOMY WITH RADIOACTIVE SEED LOCALIZATION;  Surgeon: Rolm Bookbinder, MD;  Location: [place];  Service: General;  Laterality: Left;  . [place]  . COLONOSCOPY    .  COLONOSCOPY WITH PROPOFOL N/A 04/16/2016   Procedure: COLONOSCOPY WITH PROPOFOL;  Surgeon: Carol Ada, MD;  Location: [facility];  Service: Endoscopy;  Laterality: N/A;  . CYST REMOVAL NECK Left 04/13/2016   Procedure: EXCISION OF SEBACEOUS CYST LEFT POSTERIOR NECK;  Surgeon: Armandina Gemma, MD;  Location: [place];  Service: General;  Laterality: Left;  . Excision of Pelvic Absess, Right Ovary     2008  . RE-EXCISION OF BREAST CANCER,SUPERIOR MARGINS Left 08/31/2017   Procedure: RE-EXCISION OF LEFT  BREAST MARGINS ERAS PATHWAY;  Surgeon: Rolm Bookbinder, MD;  Location: [place];  Service: General;  Laterality: Left;  . TUBAL LIGATION  1980    Social History   Socioeconomic History  . Marital status: Divorced    Spouse name: Not on file  . Number of children: 1  . Years of education: 6  . Highest education level: Not on file  Occupational History  . Occupation: Social worker, Technical sales engineer: [employer]  Tobacco Use  . Smoking status: Former Smoker    Packs/day: 1.00    Years: 50.00    Pack years: 50.00    Types: Cigarettes    Quit date: 04/16/2011    Years since quitting: 8.3  . Smokeless tobacco: Never Used  . Tobacco  comment: quit that date when she had to go to ER   Substance and Sexual Activity  . Alcohol use: Yes    Alcohol/week: 0.0 standard drinks    Comment: rare occasion  . Drug use: No    Comment: no marijuana since 2012  . Sexual activity: Not Currently  Other Topics Concern  . Not on file  Social History Narrative   HSG; [name], [place]. . Married '69 - 9 yrs/divorced. 1 son - '72; no grandchildren.   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 No h/o physical or sexual abuse. ACP - no living will - wants information. Provided packet of information. On 08/05/2011: she stated she was distant cousins to celebrities [name] and [name]      Social Determinants of Health   Financial Resource  Strain:   . Difficulty of Paying Living Expenses:   Food Insecurity:   . Worried About Charity fundraiser in the Last Year:   . Arboriculturist in the Last Year:   Transportation Needs:   . Film/video editor (Medical):   [name] Lack of Transportation (Non-Medical):   Physical Activity:   . Days of Exercise per Week:   . Minutes of Exercise per Session:   Stress:   . Feeling of Stress :   Social Connections:   . Frequency of Communication with Friends and Family:   . Frequency of Social Gatherings with Friends and Family:   . Attends Religious Services:   . Active Member of Clubs or Organizations:   . Attends Archivist Meetings:   [name] Marital Status:   Intimate Partner Violence:   . Fear of Current or Ex-Partner:   . Emotionally Abused:   [name] Physically Abused:   . Sexually Abused:     Current Outpatient Medications on File Prior to Visit  Medication Sig Dispense Refill  . Accu-Chek FastClix Lancets MISC 1 each by Does not apply route daily. Use to monitor glucose levels once per day; E11.9 102 each 2  . acetaminophen (TYLENOL) 500 MG tablet Take 1,000 mg by mouth every 6 (six) hours as needed.    . Albuterol Sulfate (PROAIR RESPICLICK) 468 (90 Base) MCG/ACT AEPB Inhale 1-2 puffs into the lungs every 6 (six) hours as needed (for wheezing/shortness of breath). 1 each 0  . amLODipine (NORVASC) 10 MG tablet Take 1 tablet (10 mg total) by mouth daily. 90 tablet 1  . anastrozole (ARIMIDEX) 1 MG tablet Take 1 tablet (1 mg total) by mouth daily. 90 tablet 4  . aspirin EC 81 MG tablet Take 1 tablet (81 mg total) by mouth daily. 90 tablet 3  . atorvastatin (LIPITOR) 80 MG tablet Take 1 tablet (80 mg total) by mouth daily at 6 PM. 90 tablet 3  . Blood Glucose Monitoring Suppl (ACCU-CHEK AVIVA PLUS) w/Device KIT 1 Device by Does not apply route daily. Use to monitor glucose levels once per day; E11.9 1 kit 0  . carvedilol (COREG) 3.125 MG tablet Take 1 tablet (3.125 mg total) by  mouth 2 (two) times daily. 180 tablet 3  . Cholecalciferol (VITAMIN D) 125 MCG (5000 UT) CAPS Take 1 capsule by mouth daily.    [name] DALIRESP 500 MCG TABS tablet Take 1 tablet by mouth once daily 90 tablet 1  . denosumab (PROLIA) 60 MG/ML SOLN injection Inject 60 mg into the skin every 6 (six) months. Administer in upper arm, thigh, or abdomen    .  glucose blood (ACCU-CHEK AVIVA PLUS) test strip 1 each by Other route daily. E11.9 100 each 0  . OXYGEN Inhale 2-3 L into the lungs continuous. When exerting self     . potassium chloride SA (K-DUR,KLOR-CON) 20 MEQ tablet Take 1 tablet (20 mEq total) by mouth 2 (two) times daily. 180 tablet 1  . SitaGLIPtin-MetFORMIN HCl (JANUMET XR) 50-1000 MG TB24 Take 1 tablet by mouth daily. 180 tablet 3  . spironolactone (ALDACTONE) 25 MG tablet Take 1 tablet (25 mg total) by mouth daily. 90 tablet 1  . thiamine (VITAMIN B-1) 100 MG tablet Take 1 tablet (100 mg total) by mouth every other day. 45 tablet 1  . TRELEGY ELLIPTA 100-62.5-25 MCG/INH AEPB INHALE 1 DOSE INTO THE LUNGS DAILY. 60 each 5   No current facility-administered medications on file prior to visit.    Allergies  Allergen Reactions  . Benicar [Olmesartan] Swelling    Swelling of face and arms   . [name] [Valsartan] Swelling    Swelling of face and arms   . Hydrocodone-Acetaminophen Nausea And Vomiting    Severe vomiting  . Lisinopril Cough  . Monosodium Glutamate Other (See Comments)    Facial swelling per pt  . Codeine Other (See Comments)    jittery  . Lead Acetate Rash  . Nickel Rash    Severe rash to infection: pt is allergic to all metals other than sterling silver or gold jewelry.     Family History  Problem Relation Age of Onset  . Heart disease Mother        MI - fatal  . Hypertension Mother   . Stroke Father 37       fatal  . Alzheimer's disease Father   . Alzheimer's disease Brother   . Hyperlipidemia Brother   . Hypertension Brother   . Diabetes Brother   .  Hypertension Brother   . Hyperlipidemia Brother   . Breast cancer Paternal Grandmother     BP 138/70   Pulse 89   Ht 5' 4.5" (1.638 m)   Wt 214 lb 3.2 oz (97.2 kg)   SpO2 98% Comment: 3L O2  BMI 36.20 kg/m    Review of Systems Denies fever    Objective:   Physical Exam VITAL SIGNS:  See vs page GENERAL: no distress NECK: MNG is again noted  Lab Results  Component Value Date   TSH 1.82 07/24/2019   T3TOTAL 94.0 10/14/2015   T4TOTAL 7.8 04/19/2019       Assessment & Plan:  Hyperthyroidism: normal after RAI.   Patient Instructions  Blood tests are requested for you today.  We'll let you know about the results.  Please continue the same medications Please come back for a follow-up appointment in 6 weeks.

## 2019-08-24 NOTE — Patient Instructions (Addendum)
Blood tests are requested for you today.  We'll let you know about the results.  Please continue the same medications Please come back for a follow-up appointment in 6 weeks.

## 2019-08-27 ENCOUNTER — Other Ambulatory Visit: Payer: Self-pay | Admitting: *Deleted

## 2019-08-27 MED ORDER — DALIRESP 500 MCG PO TABS: 500.0000 ug | ORAL_TABLET | Freq: Every day | ORAL | 3 refills | Status: DC

## 2019-09-12 DIAGNOSIS — J449 Chronic obstructive pulmonary disease, unspecified: Secondary | ICD-10-CM | POA: Diagnosis not present

## 2019-09-14 DIAGNOSIS — G4733 Obstructive sleep apnea (adult) (pediatric): Secondary | ICD-10-CM | POA: Diagnosis not present

## 2019-09-18 ENCOUNTER — Other Ambulatory Visit: Payer: Medicare Other

## 2019-09-18 ENCOUNTER — Ambulatory Visit: Payer: Medicare Other | Admitting: Oncology

## 2019-09-18 NOTE — Progress Notes (Signed)
[place]  [phone] [phone]) [phone]     ID: [name] DOB: 19-Mar-1951  MR#: [id number]  CLE#:[id number]  Patient Care Team: Janith Lima, MD as PCP - General (Internal Medicine) Troy Sine, MD as PCP - Cardiology (Cardiology) Brand Males, MD as Consulting Physician (Pulmonary Disease) Arvella Nigh, MD as Consulting Physician (Obstetrics and Gynecology) Magrinat, Virgie Dad, MD as Consulting Physician (Oncology) Rolm Bookbinder, MD as Consulting Physician (General Surgery) Gery Pray, MD as Consulting Physician (Radiation Oncology) Troy Sine, MD as Consulting Physician (Cardiology) Delice Bison, Charlestine Massed, NP as Nurse Practitioner (Hematology and Oncology) Philemon Kingdom, MD as Consulting Physician (Internal Medicine) Parrett, Fonnie Mu, NP as Nurse Practitioner (Pulmonary Disease) Renato Shin, MD as Consulting Physician (Endocrinology) OTHER MD:  CHIEF COMPLAINT: Estrogen receptor positive breast cancer   CURRENT TREATMENT: Anastrozole   INTERVAL HISTORY: Loreda returns today for follow up of her estrogen receptor positive breast cancer.   She continues on anastrozole.  She had some hot flashes initially, but those has resolved.  She does not have any arthralgias or myalgias or vaginal dryness issues with this medication.  Since her [place] visit, she underwent bone density screening on 04/10/2019. This showed a T-score of -2.2, which is considered osteopenic.  She is seeing Dr. Loanne Drilling for her thyroid issues and type two diabetes.  She is very pleased with the support and care she is receiving there and she tells me she is making real progress with the diabetes issue   REVIEW OF SYSTEMS: Bianna continues to have significant pulmonary issues, followed by Dr. Chase Caller, but she tells me she is doing well with that.  She lives by herself.  She has received both her Pfizer vaccine doses for the  coronavirus.  She has lost a cousin and 2 close friends in the last 40month.  She is grieving appropriately.    HISTORY OF CURRENT ILLNESS: From the original intake note:  AMallary Kregerhad routine screening mammography on 06/16/2017 showing a possible abnormality in the left breast. She underwent unilateral left diagnostic mammography with tomography and left breast ultrasonography at [facility] on 06/22/2017 showing: breast density category B. Suspicious left breast mass in the 2:30 position upper outer quadrant measuring 0.6 x 0.7 x 0.6 cm located 2 cm from the nipple. Palpable thickening with distortion. There was no sonographic evidence of lest axillary lymphadenopathy.  Accordingly on 06/24/2017 she proceeded to biopsy of the left breast mass in question. The pathology from this procedure showed (([id number]:  Invasive mammary carcinoma, grade 1. E-cadherin is positive supporting Ductal carcinoma diagnosis. Prognostic indicators significant for: estrogen receptor, 100% positive and progesterone receptor, 100% positive, both with strong staining intensity. Proliferation marker Ki67 at 5%. HER2 not amplified with ratios of HER2/CEP17 signals 1.21 and average copies per cell 2.00.  The patient's subsequent history is as detailed below.   PAST MEDICAL HISTORY: Past Medical History:  Diagnosis Date  . Abdominal aneurysm ([place]    Dr. FOneida Alarfollows lLOV 2 ''17 per pt "around 2 cm"  . Anemia    as a child  . Arthritis    left ankle, right knee, right SI joint, wrists, lower back  . Breast cancer in female ([facility]    Left  . COPD (chronic obstructive pulmonary disease) ([place]    ephysema-Dr. RChase Caller . Dyspnea   . Headache    as a child would have terrible headaches during season changes  . 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 Hyperlipidemia   . Hypertension   . Multiple thyroid nodules   . Murmur, cardiac 1950  . Osteoporosis   . Pneumonia   . Pre-diabetes   .  Requires continuous at home supplemental oxygen    2 L 24/7  . Sebaceous cyst    hairline sebaceous cyst left posterior neck to be excised 04-13-16 by Dr. Harlow Asa in [place] [facility]  . Sleep apnea    cpap used sometimes, uses oxygen concentrator 2 l/m nasally bedtime  . Varicella as child    PAST SURGICAL HISTORY: Past Surgical History:  Procedure Laterality Date  . APPENDECTOMY     2008  . BREAST LUMPECTOMY Left 08/31/2017  . BREAST LUMPECTOMY WITH RADIOACTIVE SEED LOCALIZATION Left 08/10/2017   Procedure: BREAST LUMPECTOMY WITH RADIOACTIVE SEED LOCALIZATION;  Surgeon: Rolm Bookbinder, MD;  Location: [place];  Service: General;  Laterality: Left;  . [place]  . COLONOSCOPY    . COLONOSCOPY WITH PROPOFOL N/A 04/16/2016   Procedure: COLONOSCOPY WITH PROPOFOL;  Surgeon: Carol Ada, MD;  Location: [facility];  Service: Endoscopy;  Laterality: N/A;  . CYST REMOVAL NECK Left 04/13/2016   Procedure: EXCISION OF SEBACEOUS CYST LEFT POSTERIOR NECK;  Surgeon: Armandina Gemma, MD;  Location: [place];  Service: General;  Laterality: Left;  . Excision of Pelvic Absess, Right Ovary     2008  . RE-EXCISION OF BREAST CANCER,SUPERIOR MARGINS Left 08/31/2017   Procedure: RE-EXCISION OF LEFT  BREAST MARGINS ERAS PATHWAY;  Surgeon: Rolm Bookbinder, MD;  Location: [place];  Service: General;  Laterality: Left;  . TUBAL LIGATION  1980    FAMILY HISTORY Family History  Problem Relation Age of Onset  . Heart disease Mother        MI - fatal  . Hypertension Mother   . Stroke Father 25       fatal  . Alzheimer's disease Father   . Alzheimer's disease Brother   . Hyperlipidemia Brother   . Hypertension Brother   . Diabetes Brother   . Hypertension Brother   . Hyperlipidemia Brother   . Breast cancer Paternal Grandmother   The patient's father died at age 76 due to a stroke and prostate cancer.  The patient's mother died at age 53 due to a heart attack and blood clot. The patient  has 3 brothers and no sisters. She notes a paternal grandmother who was diagnosed with breast cancer at age 16. She notes a paternal cousin with lung cancer. She denies a family history of ovarian cancer.   GYNECOLOGIC HISTORY:  No LMP recorded. Patient is postmenopausal. Menarche: 74 years old Age at first live birth: 74 years old [place] P1 LMP: age 47 The patient had a right ovarian cyst which required removal. She notes that she still has her left ovary. She was on HRT for a short amount of time, but discontinued due to negative side affects. She was placed on Estroven as well.    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 She helps individuals with [employer] and program assisting on the computer. She is divorced. The patient's son, Parris Signer, is disabled and works part-time at [employer] in [place]. The patient has 3 grandchildren and 3 great-grandchildren. She attends [employer].      ADVANCED DIRECTIVES:  Son, Eliseo Gum and daughter in law, Butch Penny  HEALTH MAINTENANCE: Social History   Tobacco Use  . Smoking status: Former Smoker    Packs/day: 1.00    Years: 50.00    Pack years: 50.00    Types: Cigarettes    Quit date: 04/16/2011    Years since quitting: 8.4  . Smokeless tobacco: Never Used  . Tobacco comment: quit that date when she had to go to ER   Substance Use Topics  . Alcohol use: Yes    Alcohol/week: 0.0 standard drinks    Comment: rare occasion  . Drug use: No    Comment: no marijuana since 2012     Colonoscopy: December 2017/ Dr. Benson Norway  PAP: September 2016  Bone density: 12/08/2016 showed a T score of -2.4   Allergies  Allergen Reactions  . Benicar [Olmesartan] Swelling    Swelling of face and arms   . [name] [Valsartan] Swelling    Swelling of face and arms   . Hydrocodone-Acetaminophen Nausea And Vomiting    Severe  vomiting  . Lisinopril Cough  . Monosodium Glutamate Other (See Comments)    Facial swelling per pt  . Codeine Other (See Comments)    jittery  . Lead Acetate Rash  . Nickel Rash    Severe rash to infection: pt is allergic to all metals other than sterling silver or gold jewelry.     Current Outpatient Medications  Medication Sig Dispense Refill  . Accu-Chek FastClix Lancets MISC 1 each by Does not apply route daily. Use to monitor glucose levels once per day; E11.9 102 each 2  . acetaminophen (TYLENOL) 500 MG tablet Take 1,000 mg by mouth every 6 (six) hours as needed.    . Albuterol Sulfate (PROAIR RESPICLICK) 338 (90 Base) MCG/ACT AEPB Inhale 1-2 puffs into the lungs every 6 (six) hours as needed (for wheezing/shortness of breath). 1 each 0  . amLODipine (NORVASC) 10 MG tablet Take 1 tablet (10 mg total) by mouth daily. 90 tablet 1  . anastrozole (ARIMIDEX) 1 MG tablet Take 1 tablet (1 mg total) by mouth daily. 90 tablet 4  . aspirin EC 81 MG tablet Take 1 tablet (81 mg total) by mouth daily. 90 tablet 3  . atorvastatin (LIPITOR) 80 MG tablet Take 1 tablet (80 mg total) by mouth daily at 6 PM. 90 tablet 3  . Blood Glucose Monitoring Suppl (ACCU-CHEK AVIVA PLUS) w/Device KIT 1 Device by Does not apply route daily. Use to monitor glucose levels once per day; E11.9 1 kit 0  . carvedilol (COREG) 3.125 MG tablet Take 1 tablet (3.125 mg total) by mouth 2 (two) times daily. 180 tablet 3  . Cholecalciferol (VITAMIN D) 125 MCG (5000 UT) CAPS Take 1 capsule by mouth daily.    [name] denosumab (PROLIA) 60 MG/ML SOLN injection Inject 60 mg into the skin every 6 (six) months. Administer in upper arm, thigh, or abdomen    . glucose blood (ACCU-CHEK AVIVA PLUS) test strip 1 each by Other route daily. E11.9 100 each 0  . OXYGEN Inhale 2-3 L into the lungs continuous. When exerting self     . potassium chloride SA (K-DUR,KLOR-CON) 20 MEQ tablet Take 1 tablet (20 mEq total) by mouth 2 (two) times daily. 180  tablet 1  . roflumilast (DALIRESP) 500 MCG TABS tablet Take 1 tablet (500 mcg total) by mouth daily. 90 tablet 3  . SitaGLIPtin-MetFORMIN HCl (JANUMET XR) 50-1000 MG TB24 Take 1 tablet by mouth daily. 180 tablet 3  . spironolactone (ALDACTONE) 25 MG tablet  Take 1 tablet (25 mg total) by mouth daily. 90 tablet 1  . thiamine (VITAMIN B-1) 100 MG tablet Take 1 tablet (100 mg total) by mouth every other day. 45 tablet 1  . TRELEGY ELLIPTA 100-62.5-25 MCG/INH AEPB INHALE 1 DOSE INTO THE LUNGS DAILY. 60 each 5   No current facility-administered medications for this visit.    OBJECTIVE: African-American woman receiving oxygen by nasal cannula  Vitals:   09/19/19 1137  BP: (!) 138/56  Pulse: 78  Resp: 18  Temp: 98.9 F (37.2 C)  SpO2: 98%     Body mass index is 36.13 kg/m.   Wt Readings from Last 3 Encounters:  09/19/19 213 lb 12.8 oz (97 kg)  08/24/19 214 lb 3.2 oz (97.2 kg)  07/25/19 210 lb 12.8 oz (95.6 kg)  ECOG FS:2 - Symptomatic, <50% confined to bed  Sclerae unicteric, EOMs intact Wearing a mask No cervical or supraclavicular adenopathy Lungs no rales or rhonchi Heart regular rate and rhythm Abd soft, nontender, positive bowel sounds MSK no focal spinal tenderness, no upper extremity lymphedema Neuro: nonfocal, well oriented, appropriate affect Breasts: The left breast has undergone lumpectomy.  There is no evidence of local recurrence.  Left breast is benign.  Both axillae are benign.   LAB RESULTS:  CMP     Component Value Date/Time   NA 142 09/19/2019 1110   NA 139 01/08/2019 1025   K 4.4 09/19/2019 1110   CL 102 09/19/2019 1110   CO2 30 09/19/2019 1110   GLUCOSE 126 (H) 09/19/2019 1110   BUN 14 09/19/2019 1110   BUN 15 01/08/2019 1025   CREATININE 0.77 09/19/2019 1110   CREATININE 1.13 (H) 12/11/2018 1403   CALCIUM 9.8 09/19/2019 1110   PROT 6.9 09/19/2019 1110   PROT 6.6 01/08/2019 1025   ALBUMIN 3.3 (L) 09/19/2019 1110   ALBUMIN 4.1 01/08/2019 1025    AST 10 (L) 09/19/2019 1110   AST 11 (L) 12/11/2018 1403   ALT <6 09/19/2019 1110   ALT 7 12/11/2018 1403   ALKPHOS 92 09/19/2019 1110   BILITOT 0.3 09/19/2019 1110   BILITOT 0.3 01/08/2019 1025   BILITOT 0.3 12/11/2018 1403   GFRNONAA >60 09/19/2019 1110   GFRNONAA 48 (L) 12/11/2018 1403   GFRAA >60 09/19/2019 1110   GFRAA 55 (L) 12/11/2018 1403    Lab Results  Component Value Date   TOTALPROTELP 6.7 02/28/2014   ALBUMINELP 57.4 02/28/2014   A1GS 4.9 02/28/2014   A2GS 8.5 02/28/2014   BETS 7.5 (H) 02/28/2014   BETA2SER 5.3 02/28/2014   GAMS 16.4 02/28/2014   MSPIKE 0.31 02/28/2014   SPEI SEE NOTE 02/28/2014    No results found for: KPAFRELGTCHN, LAMBDASER, KAPLAMBRATIO  Lab Results  Component Value Date   WBC 9.4 09/19/2019   NEUTROABS 6.5 09/19/2019   HGB 11.4 (L) 09/19/2019   HCT 35.0 (L) 09/19/2019   MCV 86.0 09/19/2019   PLT 228 09/19/2019   No results found for: LABCA2  No components found for: [id number]  No results for input(s): INR in the last 168 hours.  No results found for: LABCA2  No results found for: PTW656  No results found for: CLE751  No results found for: ZGY174  No results found for: CA2729  No components found for: HGQUANT  No results found for: CEA1 / No results found for: CEA1   No results found for: AFPTUMOR  No results found for: Dorris  No results found for: HGBA, HGBA2QUANT, HGBFQUANT, HGBSQUAN (Hemoglobinopathy evaluation)  No results found for: LDH  Lab Results  Component Value Date   IRON 59 04/19/2019   IRONPCTSAT 16.4 (L) 04/19/2019   (Iron and TIBC)  Lab Results  Component Value Date   FERRITIN 84.2 04/19/2019    Urinalysis    Component Value Date/Time   COLORURINE YELLOW 04/19/2019 [place] 04/19/2019 1208   LABSPEC 1.015 04/19/2019 1208   PHURINE 6.5 04/19/2019 1208   GLUCOSEU NEGATIVE 04/19/2019 1208   HGBUR TRACE-INTACT (A) 04/19/2019 [place] 04/19/2019  [place] 04/19/2019 1208   PROTEINUR 30 (A) 04/18/2011 1942   UROBILINOGEN 0.2 04/19/2019 1208   NITRITE NEGATIVE 04/19/2019 1208   LEUKOCYTESUR SMALL (A) 04/19/2019 1208    STUDIES: No results found.   ELIGIBLE FOR AVAILABLE RESEARCH PROTOCOL: no  ASSESSMENT: 52 y.o. [place] woman status post left breast upper outer quadrant biopsy 06/24/2017 for a clinical T1b N0, stage IA invasive ductal carcinoma, grade 1, estrogen and progesterone receptor positive, HER-2 not amplified, with an MIB-105%  (1) status post left lumpectomy without sentinel lymph node sampling on 08/10/2017 showed invasive ductal carcinoma grade II, spanning 1.2 cm. High grade DCIS. Anterior margin was was focally positive for invasive ductal carcinoma. All other margins clear.   (a) additional surgery for margin clearance 08/31/2017 showed atypical ductal hyperplasia, negative margins  (2) given the patient's age, the tumor size, grade and prognostic profile, will forego Oncotype and avoid chemotherapy  (3) may forgo adjuvant radiation if comfortable with anti-estrogen therapy  (4) anastrozole started 08/30/2017  (5) osteoporosis: bone density on 02/27/2014 showed a T score of -2.9   (a) bone density on 12/08/2016 showed a T score of -2.4 (osteopenia).  (6) anastrozole started 09/14/2017  (7) incidentally noted adrenal adenomas stable on most recent scan (10/28/2017).  (8) lung cancer screening:  (a) low-dose CT of the chest 10/28/2017 was benign.  There was significant aortic atherosclerosis and left main coronary artery disease, severe emphysema, and likely benign adrenal lesions   PLAN: Ermelinda is now just over 2 years out from definitive surgery for her breast cancer with no evidence of disease recurrence.  This is favorable.  She is tolerating anastrozole well and the plan will be to continue that a total of 5 years.  She will be due for repeat mammography in July and I have entered  that order.  Otherwise I will schedule a survivorship visit in November and I will see her again in August of next year.  Total encounter time 25 minutes.Sarajane Jews C. Ahijah Devery, MD 09/19/19 12:51 PM Medical Oncology and Hematology [facility] [address], [place] [address] Tel. [phone]    Fax. [phone]    I, Wilburn Mylar, am acting as scribe for Dr. Virgie Dad. Garner Dullea.  I, Lurline Del MD, have reviewed the above documentation for accuracy and completeness, and I agree with the above.    *Total Encounter Time as defined by the Centers for Medicare and Medicaid Services includes, in addition to the face-to-face time of a patient visit (documented in the note above) non-face-to-face time: obtaining and reviewing outside history, ordering and reviewing medications, tests or procedures, care coordination (communications with other health care professionals or caregivers) and documentation in the medical record.

## 2019-09-19 ENCOUNTER — Inpatient Hospital Stay: Payer: Medicare Other | Admitting: Oncology

## 2019-09-19 ENCOUNTER — Ambulatory Visit: Payer: Medicare Other | Admitting: Oncology

## 2019-09-19 ENCOUNTER — Inpatient Hospital Stay: Payer: Medicare Other | Attending: Oncology

## 2019-09-19 ENCOUNTER — Other Ambulatory Visit: Payer: Self-pay

## 2019-09-19 ENCOUNTER — Other Ambulatory Visit: Payer: Medicare Other

## 2019-09-19 VITALS — BP 138/56 | HR 78 | Temp 98.9°F | Resp 18 | Ht 64.5 in | Wt 213.8 lb

## 2019-09-19 DIAGNOSIS — C50412 Malignant neoplasm of upper-outer quadrant of left female breast: Secondary | ICD-10-CM | POA: Insufficient documentation

## 2019-09-19 DIAGNOSIS — M818 Other osteoporosis without current pathological fracture: Secondary | ICD-10-CM | POA: Diagnosis not present

## 2019-09-19 DIAGNOSIS — Z79811 Long term (current) use of aromatase inhibitors: Secondary | ICD-10-CM | POA: Insufficient documentation

## 2019-09-19 DIAGNOSIS — M8580 Other specified disorders of bone density and structure, unspecified site: Secondary | ICD-10-CM | POA: Diagnosis not present

## 2019-09-19 DIAGNOSIS — Z170 Estrogen receptor positive status [ER+]: Secondary | ICD-10-CM

## 2019-09-19 DIAGNOSIS — E118 Type 2 diabetes mellitus with unspecified complications: Secondary | ICD-10-CM

## 2019-09-19 DIAGNOSIS — Z79899 Other long term (current) drug therapy: Secondary | ICD-10-CM | POA: Diagnosis not present

## 2019-09-19 LAB — CBC WITH DIFFERENTIAL/PLATELET
Abs Immature Granulocytes: 0.02 10*3/uL (ref 0.00–0.07)
Basophils Absolute: 0 10*3/uL (ref 0.0–0.1)
Basophils Relative: 0 %
Eosinophils Absolute: 0.1 10*3/uL (ref 0.0–0.5)
Eosinophils Relative: 1 %
HCT: 35 % — ABNORMAL LOW (ref 36.0–46.0)
Hemoglobin: 11.4 g/dL — ABNORMAL LOW (ref 12.0–15.0)
Immature Granulocytes: 0 %
Lymphocytes Relative: 21 %
Lymphs Abs: 2 10*3/uL (ref 0.7–4.0)
MCH: 28 pg (ref 26.0–34.0)
MCHC: 32.6 g/dL (ref 30.0–36.0)
MCV: 86 fL (ref 80.0–100.0)
Monocytes Absolute: 0.7 10*3/uL (ref 0.1–1.0)
Monocytes Relative: 7 %
Neutro Abs: 6.5 10*3/uL (ref 1.7–7.7)
Neutrophils Relative %: 71 %
Platelets: 228 10*3/uL (ref 150–400)
RBC: 4.07 MIL/uL (ref 3.87–5.11)
RDW: 14.5 % (ref 11.5–15.5)
WBC: 9.4 10*3/uL (ref 4.0–10.5)
nRBC: 0 % (ref 0.0–0.2)

## 2019-09-19 LAB — COMPREHENSIVE METABOLIC PANEL
ALT: <6 U/L (ref 0–44)
AST: 10 U/L — ABNORMAL LOW (ref 15–41)
Albumin: 3.3 g/dL — ABNORMAL LOW (ref 3.5–5.0)
Alkaline Phosphatase: 92 U/L (ref 38–126)
Anion gap: 10 (ref 5–15)
BUN: 14 mg/dL (ref 8–23)
CO2: 30 mmol/L (ref 22–32)
Calcium: 9.8 mg/dL (ref 8.9–10.3)
Chloride: 102 mmol/L (ref 98–111)
Creatinine, Ser: 0.77 mg/dL (ref 0.44–1.00)
GFR calc Af Amer: >60 mL/min (ref >60)
GFR calc non Af Amer: >60 mL/min (ref >60)
Glucose, Bld: 126 mg/dL — ABNORMAL HIGH (ref 70–99)
Potassium: 4.4 mmol/L (ref 3.5–5.1)
Sodium: 142 mmol/L (ref 135–145)
Total Bilirubin: 0.3 mg/dL (ref 0.3–1.2)
Total Protein: 6.9 g/dL (ref 6.5–8.1)

## 2019-09-20 ENCOUNTER — Telehealth: Payer: Self-pay | Admitting: Oncology

## 2019-09-20 ENCOUNTER — Other Ambulatory Visit: Payer: Self-pay | Admitting: Oncology

## 2019-09-20 NOTE — Telephone Encounter (Signed)
Scheduled appts per 5/5 los. Pt stated she would like to hold off on scheduling Aug 2022 appt until we get close to that time. Pt confirmed next appt date and time.

## 2019-10-03 ENCOUNTER — Other Ambulatory Visit: Payer: Self-pay

## 2019-10-05 ENCOUNTER — Ambulatory Visit: Payer: Medicare Other | Admitting: Endocrinology

## 2019-10-05 ENCOUNTER — Encounter: Payer: Self-pay | Admitting: Endocrinology

## 2019-10-05 ENCOUNTER — Other Ambulatory Visit: Payer: Self-pay

## 2019-10-05 VITALS — BP 142/70 | HR 82 | Ht 64.5 in | Wt 214.0 lb

## 2019-10-05 DIAGNOSIS — E118 Type 2 diabetes mellitus with unspecified complications: Secondary | ICD-10-CM | POA: Diagnosis not present

## 2019-10-05 LAB — T4, FREE: Free T4: 0.89 ng/dL (ref 0.60–1.60)

## 2019-10-05 LAB — TSH: TSH: 1.73 u[IU]/mL (ref 0.35–4.50)

## 2019-10-05 LAB — POCT GLYCOSYLATED HEMOGLOBIN (HGB A1C): Hemoglobin A1C: 6.9 % — AB (ref 4.0–5.6)

## 2019-10-05 NOTE — Patient Instructions (Signed)
Blood tests are requested for you today.  We'll let you know about the results.  Please continue the same medications.   Please come back for a follow-up appointment in 2 months.

## 2019-10-05 NOTE — Progress Notes (Signed)
Subjective:    Patient ID: Victoria [name], female    DOB: Apr 21, 1946, 74 y.o.   MRN: [id number]  HPI Pt returns for f/u of diabetes mellitus: DM type: 2 Dx'ed: 9509 Complications: CAD and PAD Therapy: Janumet GDM: never DKA: never Severe hypoglycemia: never Pancreatitis: never Pancreatic imaging: normal on 2008 CT.   Other: edema limits rx options; fructosamine showed better glycemic control than A1c.   Interval history: pt says cbg's are in the low-100's.   Pt also has hyperthyroidism (dx'ed 2019; US showed MNG; bx of RLL and RML nodules in 2019 showed beth cat 2; she had RAI 11/20).   Past Medical History:  Diagnosis Date  . Abdominal aneurysm ([place])    Dr. Oneida Alar follows lLOV 2 ''17 per pt "around 2 cm"  . Anemia    as a child  . Arthritis    left ankle, right knee, right SI joint, wrists, lower back  . Breast cancer in female [facility])    Left  . COPD (chronic obstructive pulmonary disease) ([place])    ephysema-Dr. Chase Caller  . Dyspnea   . Headache    as a child would have terrible headaches during season changes  . 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 Hyperlipidemia   . Hypertension   . Multiple thyroid nodules   . Murmur, cardiac 1950  . Osteoporosis   . Pneumonia   . Pre-diabetes   . Requires continuous at home supplemental oxygen    2 L 24/7  . Sebaceous cyst    hairline sebaceous cyst left posterior neck to be excised 04-13-16 by Dr. Harlow Asa in [place] [facility]  . Sleep apnea    cpap used sometimes, uses oxygen concentrator 2 l/m nasally bedtime  . Varicella as child    Past Surgical History:  Procedure Laterality Date  . APPENDECTOMY     2008  . BREAST LUMPECTOMY Left 08/31/2017  . BREAST LUMPECTOMY WITH RADIOACTIVE SEED LOCALIZATION Left 08/10/2017   Procedure: BREAST LUMPECTOMY WITH RADIOACTIVE SEED LOCALIZATION;  Surgeon: Rolm Bookbinder, MD;  Location: [place];  Service: General;  Laterality: Left;  . [place]  .  COLONOSCOPY    . COLONOSCOPY WITH PROPOFOL N/A 04/16/2016   Procedure: COLONOSCOPY WITH PROPOFOL;  Surgeon: Carol Ada, MD;  [facility];  Service: Endoscopy;  Laterality: N/A;  . CYST REMOVAL NECK Left 04/13/2016   Procedure: EXCISION OF SEBACEOUS CYST LEFT POSTERIOR NECK;  Surgeon: Armandina Gemma, MD;  Location: [place];  Service: General;  Laterality: Left;  . Excision of Pelvic Absess, Right Ovary     2008  . RE-EXCISION OF BREAST CANCER,SUPERIOR MARGINS Left 08/31/2017   Procedure: RE-EXCISION OF LEFT  BREAST MARGINS ERAS PATHWAY;  Surgeon: Rolm Bookbinder, MD;  Location: [place];  Service: General;  Laterality: Left;  . TUBAL LIGATION  1980    Social History   Socioeconomic History  . Marital status: Divorced    Spouse name: Not on file  . Number of children: 1  . Years of education: 75  . Highest education level: Not on file  Occupational History  . Occupation: Social worker, Technical sales engineer: [employer]  Tobacco Use  . Smoking status: Former Smoker    Packs/day: 1.00    Years: 50.00    Pack years: 50.00    Types: Cigarettes    Quit date: 04/16/2011    Years since quitting: 8.4  . Smokeless tobacco: Never Used  .  Tobacco comment: quit that date when she had to go to ER   Substance and Sexual Activity  . Alcohol use: Yes    Alcohol/week: 0.0 standard drinks    Comment: rare occasion  . Drug use: No    Comment: no marijuana since 2012  . Sexual activity: Not Currently  Other Topics Concern  . Not on file  Social History Narrative   HSG; [name], [place]. . Married '69 - 9 yrs/divorced. 1 son - '72; no grandchildren.   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 No h/o physical or sexual abuse. ACP - no living will - wants information. Provided packet of information. On 08/05/2011: she stated she was distant cousins to celebrities [name] and [name]      Social Determinants of Health    Financial Resource Strain:   . Difficulty of Paying Living Expenses:   Food Insecurity:   . Worried About Charity fundraiser in the Last Year:   . Arboriculturist in the Last Year:   Transportation Needs:   . Film/video editor (Medical):   [name] Lack of Transportation (Non-Medical):   Physical Activity:   . Days of Exercise per Week:   . Minutes of Exercise per Session:   Stress:   . Feeling of Stress :   Social Connections:   . Frequency of Communication with Friends and Family:   . Frequency of Social Gatherings with Friends and Family:   . Attends Religious Services:   . Active Member of Clubs or Organizations:   . Attends Archivist Meetings:   [name] Marital Status:   Intimate Partner Violence:   . Fear of Current or Ex-Partner:   . Emotionally Abused:   [name] Physically Abused:   . Sexually Abused:     Current Outpatient Medications on File Prior to Visit  Medication Sig Dispense Refill  . Accu-Chek FastClix Lancets MISC 1 each by Does not apply route daily. Use to monitor glucose levels once per day; E11.9 102 each 2  . acetaminophen (TYLENOL) 500 MG tablet Take 1,000 mg by mouth every 6 (six) hours as needed.    . Albuterol Sulfate (PROAIR RESPICLICK) 510 (90 Base) MCG/ACT AEPB Inhale 1-2 puffs into the lungs every 6 (six) hours as needed (for wheezing/shortness of breath). 1 each 0  . amLODipine (NORVASC) 10 MG tablet Take 1 tablet (10 mg total) by mouth daily. 90 tablet 1  . anastrozole (ARIMIDEX) 1 MG tablet Take 1 tablet (1 mg total) by mouth daily. 90 tablet 4  . aspirin EC 81 MG tablet Take 1 tablet (81 mg total) by mouth daily. 90 tablet 3  . atorvastatin (LIPITOR) 80 MG tablet Take 1 tablet (80 mg total) by mouth daily at 6 PM. 90 tablet 3  . Blood Glucose Monitoring Suppl (ACCU-CHEK AVIVA PLUS) w/Device KIT 1 Device by Does not apply route daily. Use to monitor glucose levels once per day; E11.9 1 kit 0  . carvedilol (COREG) 3.125 MG tablet Take 1 tablet  (3.125 mg total) by mouth 2 (two) times daily. 180 tablet 3  . Cholecalciferol (VITAMIN D) 125 MCG (5000 UT) CAPS Take 1 capsule by mouth daily.    [name] denosumab (PROLIA) 60 MG/ML SOLN injection Inject 60 mg into the skin every 6 (six) months. Administer in upper arm, thigh, or abdomen    . glucose blood (ACCU-CHEK AVIVA PLUS) test strip 1 each by Other route daily. E11.9 100 each  0  . OXYGEN Inhale 2-3 L into the lungs continuous. When exerting self     . potassium chloride SA (K-DUR,KLOR-CON) 20 MEQ tablet Take 1 tablet (20 mEq total) by mouth 2 (two) times daily. 180 tablet 1  . roflumilast (DALIRESP) 500 MCG TABS tablet Take 1 tablet (500 mcg total) by mouth daily. 90 tablet 3  . SitaGLIPtin-MetFORMIN HCl (JANUMET XR) 50-1000 MG TB24 Take 1 tablet by mouth daily. 180 tablet 3  . spironolactone (ALDACTONE) 25 MG tablet Take 1 tablet (25 mg total) by mouth daily. 90 tablet 1  . thiamine (VITAMIN B-1) 100 MG tablet Take 1 tablet (100 mg total) by mouth every other day. 45 tablet 1  . TRELEGY ELLIPTA 100-62.5-25 MCG/INH AEPB INHALE 1 DOSE INTO THE LUNGS DAILY. 60 each 5   No current facility-administered medications on file prior to visit.    Allergies  Allergen Reactions  . Benicar [Olmesartan] Swelling    Swelling of face and arms   . [name] [Valsartan] Swelling    Swelling of face and arms   . Hydrocodone-Acetaminophen Nausea And Vomiting    Severe vomiting  . Lisinopril Cough  . Monosodium Glutamate Other (See Comments)    Facial swelling per pt  . Codeine Other (See Comments)    jittery  . Lead Acetate Rash  . Nickel Rash    Severe rash to infection: pt is allergic to all metals other than sterling silver or gold jewelry.     Family History  Problem Relation Age of Onset  . Heart disease Mother        MI - fatal  . Hypertension Mother   . Stroke Father 55       fatal  . Alzheimer's disease Father   . Alzheimer's disease Brother   . Hyperlipidemia Brother   . Hypertension  Brother   . Diabetes Brother   . Hypertension Brother   . Hyperlipidemia Brother   . Breast cancer Paternal Grandmother     BP (!) 142/70   Pulse 82   Ht 5' 4.5" (1.638 m)   Wt 214 lb (97.1 kg)   SpO2 90% Comment: 3L O2  BMI 36.17 kg/m    Review of Systems     Objective:   Physical Exam VITAL SIGNS:  See vs page GENERAL: slightly sob.  Has 02 on Pulses: dorsalis pedis intact bilat.   MSK: no deformity of the feet CV: no leg edema Skin:  no ulcer on the feet.  normal color and temp on the feet. Neuro: sensation is intact to touch on the feet Ext: there is bilateral onychomycosis of the toenails  A1c=6.9% Lab Results  Component Value Date   TSH 1.73 10/05/2019   T3TOTAL 94.0 10/14/2015   T4TOTAL 7.8 04/19/2019       Assessment & Plan:  Hyperthyroidism.  Now euthyroid after RAI in 2020.  We'll follow.  Type 2 DM: well-controlled.   HTN: recheck next time.  Patient Instructions  Blood tests are requested for you today.  We'll let you know about the results.  Please continue the same medications.   Please come back for a follow-up appointment in 2 months.

## 2019-10-08 ENCOUNTER — Other Ambulatory Visit: Payer: Self-pay

## 2019-10-08 ENCOUNTER — Ambulatory Visit (INDEPENDENT_AMBULATORY_CARE_PROVIDER_SITE_OTHER): Payer: Medicare Other

## 2019-10-08 ENCOUNTER — Telehealth: Payer: Self-pay

## 2019-10-08 DIAGNOSIS — M818 Other osteoporosis without current pathological fracture: Secondary | ICD-10-CM

## 2019-10-08 MED ORDER — DENOSUMAB 60 MG/ML ~~LOC~~ SOSY
60.0000 mg | PREFILLED_SYRINGE | Freq: Once | SUBCUTANEOUS | Status: AC
  Administered 2019-10-08: 60 mg via SUBCUTANEOUS

## 2019-10-08 NOTE — Progress Notes (Addendum)
Please sign off as pt has had her Prolia shot today.  I have reviewed and agree

## 2019-10-08 NOTE — Telephone Encounter (Signed)
LAB RESULTS  Lab results were reviewed by Dr. Ellison. A letter has been mailed to pt home address. For future reference, letter can be found in Epic. 

## 2019-10-08 NOTE — Telephone Encounter (Signed)
-----   Message from Renato Shin, MD sent at 10/05/2019  6:27 PM EDT ----- please contact patient: Normal--good.  I'll see you next time.

## 2019-10-12 ENCOUNTER — Other Ambulatory Visit: Payer: Self-pay | Admitting: Internal Medicine

## 2019-10-12 DIAGNOSIS — J449 Chronic obstructive pulmonary disease, unspecified: Secondary | ICD-10-CM | POA: Diagnosis not present

## 2019-10-12 DIAGNOSIS — E876 Hypokalemia: Secondary | ICD-10-CM

## 2019-10-23 ENCOUNTER — Telehealth: Payer: Self-pay | Admitting: Internal Medicine

## 2019-10-23 MED ORDER — DALIRESP 500 MCG PO TABS: 500.0000 ug | ORAL_TABLET | Freq: Every day | ORAL | 1 refills | Status: DC

## 2019-10-23 NOTE — Telephone Encounter (Signed)
No Daliresp samples in office at this time.   Initiated PA via [email or web address] Key: [id number] PA has been approved through 05/16/2020.   Patient aware.  Pharmacy aware.  Nothing further needed at this time- will close encounter.

## 2019-10-23 NOTE — Telephone Encounter (Signed)
Spoke with pt. She is needing a refill on Daliresp. Rx has been sent in. Nothing further was needed.

## 2019-10-31 ENCOUNTER — Ambulatory Visit: Payer: Medicare Other | Admitting: Adult Health

## 2019-11-10 DIAGNOSIS — G4733 Obstructive sleep apnea (adult) (pediatric): Secondary | ICD-10-CM | POA: Diagnosis not present

## 2019-11-12 DIAGNOSIS — J449 Chronic obstructive pulmonary disease, unspecified: Secondary | ICD-10-CM | POA: Diagnosis not present

## 2019-11-14 ENCOUNTER — Ambulatory Visit: Payer: Medicare Other | Admitting: Adult Health

## 2019-11-20 ENCOUNTER — Telehealth: Payer: Self-pay | Admitting: Internal Medicine

## 2019-11-20 NOTE — Telephone Encounter (Signed)
New message:   Pt is calling and states he has a bump on her elbow and she is unsure what it is but wants to know if she would be able to still get a shingles vaccination. Please advise.

## 2019-11-21 ENCOUNTER — Telehealth: Payer: Self-pay | Admitting: Internal Medicine

## 2019-11-21 MED ORDER — TRELEGY ELLIPTA 100-62.5-25 MCG/INH IN AEPB: INHALATION_SPRAY | RESPIRATORY_TRACT | 5 refills | Status: DC

## 2019-11-21 MED ORDER — ZOSTER VAC RECOMB ADJUVANTED 50 MCG/0.5ML IM SUSR
0.5000 mL | Freq: Once | INTRAMUSCULAR | 1 refills | Status: AC
Start: 2019-11-21 — End: 2019-11-21

## 2019-11-21 NOTE — Telephone Encounter (Signed)
Patient called, needing refill of Trelegy, refill sent, called pharmacy to confirm.

## 2019-11-21 NOTE — Telephone Encounter (Signed)
New Message:   Pt is calling back to follow up from message on her bite/bump. Please advise.

## 2019-11-21 NOTE — Telephone Encounter (Signed)
Pt contacted and informed that as long as she is not running a fever and feels well, it should be fine to get her shingrix vaccine. Order sent to pharmacy.

## 2019-11-23 ENCOUNTER — Other Ambulatory Visit: Payer: Self-pay | Admitting: Oncology

## 2019-11-23 ENCOUNTER — Telehealth: Payer: Self-pay | Admitting: Internal Medicine

## 2019-11-23 DIAGNOSIS — I10 Essential (primary) hypertension: Secondary | ICD-10-CM

## 2019-11-23 DIAGNOSIS — E876 Hypokalemia: Secondary | ICD-10-CM

## 2019-11-23 MED ORDER — SPIRONOLACTONE 25 MG PO TABS: 25.0000 mg | ORAL_TABLET | Freq: Every day | ORAL | 0 refills | Status: DC

## 2019-11-23 MED ORDER — AMLODIPINE BESYLATE 10 MG PO TABS: 10.0000 mg | ORAL_TABLET | Freq: Every day | ORAL | 0 refills | Status: DC

## 2019-11-23 NOTE — Telephone Encounter (Signed)
Erx sent in for 30 day supply. Pt does have an appt for 12/18/2019.    Will send in for 90 day if pt request.

## 2019-11-23 NOTE — Telephone Encounter (Signed)
   1.Medication Requested: amLODipine (NORVASC) 10 MG tablet spironolactone (ALDACTONE) 25 MG tablet  2. Pharmacy (Name, Street, [place]):[facility] ([facility] [place] - [address]  3. On Med List: yes  4. Last Visit with PCP: 06/20/19  5. Next visit date with PCP: 12/18/19   Agent: Please be advised that RX refills may take up to 3 business days. We ask that you follow-up with your pharmacy.

## 2019-11-29 ENCOUNTER — Ambulatory Visit: Payer: Medicare Other | Admitting: Adult Health

## 2019-11-29 ENCOUNTER — Other Ambulatory Visit: Payer: Self-pay

## 2019-12-05 ENCOUNTER — Other Ambulatory Visit: Payer: Self-pay

## 2019-12-05 ENCOUNTER — Ambulatory Visit
Admission: RE | Admit: 2019-12-05 | Discharge: 2019-12-05 | Disposition: A | Discharge status: Home / Self Care | Payer: Medicare Other | Source: Ambulatory Visit | Attending: Oncology | Admitting: Oncology

## 2019-12-05 DIAGNOSIS — R928 Other abnormal and inconclusive findings on diagnostic imaging of breast: Secondary | ICD-10-CM | POA: Diagnosis not present

## 2019-12-05 DIAGNOSIS — Z853 Personal history of malignant neoplasm of breast: Secondary | ICD-10-CM | POA: Diagnosis not present

## 2019-12-05 DIAGNOSIS — Z170 Estrogen receptor positive status [ER+]: Secondary | ICD-10-CM

## 2019-12-05 DIAGNOSIS — C50412 Malignant neoplasm of upper-outer quadrant of left female breast: Secondary | ICD-10-CM

## 2019-12-07 ENCOUNTER — Encounter: Payer: Self-pay | Admitting: Endocrinology

## 2019-12-07 ENCOUNTER — Other Ambulatory Visit: Payer: Self-pay

## 2019-12-07 ENCOUNTER — Ambulatory Visit (INDEPENDENT_AMBULATORY_CARE_PROVIDER_SITE_OTHER): Payer: Medicare Other | Admitting: Endocrinology

## 2019-12-07 VITALS — BP 130/60 | HR 82 | Ht 64.5 in | Wt 213.0 lb

## 2019-12-07 DIAGNOSIS — E118 Type 2 diabetes mellitus with unspecified complications: Secondary | ICD-10-CM

## 2019-12-07 LAB — POCT GLYCOSYLATED HEMOGLOBIN (HGB A1C): Hemoglobin A1C: 6.8 % — AB (ref 4.0–5.6)

## 2019-12-07 NOTE — Patient Instructions (Addendum)
Please continue the same medications.   check your blood sugar once a day.  vary the time of day when you check, between before the 3 meals, and at bedtime.  also check if you have symptoms of your blood sugar being too high or too low.  please keep a record of the readings and bring it to your next appointment here (or you can bring the meter itself).  You can write it on any piece of paper.  please call us sooner if your blood sugar goes below 70, or if you have a lot of readings over 200. Please come back for a follow-up appointment in 3-4 months.

## 2019-12-07 NOTE — Progress Notes (Signed)
Subjective:    Patient ID: Victoria [name], female    DOB: 1946-02-20, 74 y.o.   MRN: [id number]  HPI Pt returns for f/u of diabetes mellitus: DM type: 2 Dx'ed: 0865 Complications: CAD and PAD Therapy: Janumet GDM: never DKA: never Severe hypoglycemia: never Pancreatitis: never Pancreatic imaging: normal on 2008 CT.   Other: edema limits rx options; fructosamine showed better glycemic control than A1c.   Interval history: pt says cbg's are in the low-100's.   Pt also has hyperthyroidism (dx'ed 2019; US showed MNG; bx of RLL and RML nodules in 2019 showed beth cat 2; she had RAI 11/20; since then, she is euthyroid off rx).  Past Medical History:  Diagnosis Date  . Abdominal aneurysm ([place])    Dr. Oneida Alar follows lLOV 2 ''17 per pt "around 2 cm"  . Anemia    as a child  . Arthritis    left ankle, right knee, right SI joint, wrists, lower back  . Breast cancer in female [facility])    Left  . COPD (chronic obstructive pulmonary disease) ([place])    ephysema-Dr. Chase Caller  . Dyspnea   . Headache    as a child would have terrible headaches during season changes  . 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 Hyperlipidemia   . Hypertension   . Multiple thyroid nodules   . Murmur, cardiac 1950  . Osteoporosis   . Pneumonia   . Pre-diabetes   . Requires continuous at home supplemental oxygen    2 L 24/7  . Sebaceous cyst    hairline sebaceous cyst left posterior neck to be excised 04-13-16 by Dr. Harlow Asa in [place] [facility]  . Sleep apnea    cpap used sometimes, uses oxygen concentrator 2 l/m nasally bedtime  . Varicella as child    Past Surgical History:  Procedure Laterality Date  . APPENDECTOMY     2008  . BREAST LUMPECTOMY Left 08/31/2017  . BREAST LUMPECTOMY WITH RADIOACTIVE SEED LOCALIZATION Left 08/10/2017   Procedure: BREAST LUMPECTOMY WITH RADIOACTIVE SEED LOCALIZATION;  Surgeon: Rolm Bookbinder, MD;  Location: [place];  Service: General;  Laterality: Left;  .  [place]  . COLONOSCOPY    . COLONOSCOPY WITH PROPOFOL N/A 04/16/2016   Procedure: COLONOSCOPY WITH PROPOFOL;  Surgeon: Carol Ada, MD;  [facility];  Service: Endoscopy;  Laterality: N/A;  . CYST REMOVAL NECK Left 04/13/2016   Procedure: EXCISION OF SEBACEOUS CYST LEFT POSTERIOR NECK;  Surgeon: Armandina Gemma, MD;  Location: [place];  Service: General;  Laterality: Left;  . Excision of Pelvic Absess, Right Ovary     2008  . RE-EXCISION OF BREAST CANCER,SUPERIOR MARGINS Left 08/31/2017   Procedure: RE-EXCISION OF LEFT  BREAST MARGINS ERAS PATHWAY;  Surgeon: Rolm Bookbinder, MD;  Location: [facility];  Service: General;  Laterality: Left;  . TUBAL LIGATION  1980    Social History   Socioeconomic History  . Marital status: Divorced    Spouse name: Not on file  . Number of children: 1  . Years of education: 44  . Highest education level: Not on file  Occupational History  . Occupation: Social worker, Technical sales engineer: [employer]  Tobacco Use  . Smoking status: Former Smoker    Packs/day: 1.00    Years: 50.00    Pack years: 50.00    Types: Cigarettes    Quit date: 04/16/2011    Years since quitting: 8.6  .  Smokeless tobacco: Never Used  . Tobacco comment: quit that date when she had to go to ER   Vaping Use  . Vaping Use: Never used  Substance and Sexual Activity  . Alcohol use: Yes    Alcohol/week: 0.0 standard drinks    Comment: rare occasion  . Drug use: No    Comment: no marijuana since 2012  . Sexual activity: Not Currently  Other Topics Concern  . Not on file  Social History Narrative   HSG; [name], [place]. . Married '69 - 9 yrs/divorced. 1 son - '72; no grandchildren.   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 No h/o physical or sexual abuse. ACP - no living will - wants information. Provided packet of information. On 08/05/2011: she stated she was distant cousins to celebrities  [name] and [name]      Social Determinants of Health   Financial Resource Strain:   . Difficulty of Paying Living Expenses:   Food Insecurity:   . Worried About Charity fundraiser in the Last Year:   . Arboriculturist in the Last Year:   Transportation Needs:   . Film/video editor (Medical):   [name] Lack of Transportation (Non-Medical):   Physical Activity:   . Days of Exercise per Week:   . Minutes of Exercise per Session:   Stress:   . Feeling of Stress :   Social Connections:   . Frequency of Communication with Friends and Family:   . Frequency of Social Gatherings with Friends and Family:   . Attends Religious Services:   . Active Member of Clubs or Organizations:   . Attends Archivist Meetings:   [name] Marital Status:   Intimate Partner Violence:   . Fear of Current or Ex-Partner:   . Emotionally Abused:   [name] Physically Abused:   . Sexually Abused:     Current Outpatient Medications on File Prior to Visit  Medication Sig Dispense Refill  . Accu-Chek FastClix Lancets MISC 1 each by Does not apply route daily. Use to monitor glucose levels once per day; E11.9 102 each 2  . acetaminophen (TYLENOL) 500 MG tablet Take 1,000 mg by mouth every 6 (six) hours as needed.    . Albuterol Sulfate (PROAIR RESPICLICK) 256 (90 Base) MCG/ACT AEPB Inhale 1-2 puffs into the lungs every 6 (six) hours as needed (for wheezing/shortness of breath). 1 each 0  . amLODipine (NORVASC) 10 MG tablet Take 1 tablet (10 mg total) by mouth daily. 30 tablet 0  . anastrozole (ARIMIDEX) 1 MG tablet Take 1 tablet by mouth once daily 90 tablet 0  . aspirin EC 81 MG tablet Take 1 tablet (81 mg total) by mouth daily. 90 tablet 3  . atorvastatin (LIPITOR) 80 MG tablet Take 1 tablet (80 mg total) by mouth daily at 6 PM. 90 tablet 3  . Blood Glucose Monitoring Suppl (ACCU-CHEK AVIVA PLUS) w/Device KIT 1 Device by Does not apply route daily. Use to monitor glucose levels once per day; E11.9 1  kit 0  . carvedilol (COREG) 3.125 MG tablet Take 1 tablet (3.125 mg total) by mouth 2 (two) times daily. 180 tablet 3  . Cholecalciferol (VITAMIN D) 125 MCG (5000 UT) CAPS Take 1 capsule by mouth daily.    [name] denosumab (PROLIA) 60 MG/ML SOLN injection Inject 60 mg into the skin every 6 (six) months. Administer in upper arm, thigh, or abdomen    . Fluticasone-Umeclidin-Vilant (TRELEGY ELLIPTA)  100-62.5-25 MCG/INH AEPB INHALE 1 DOSE INTO THE LUNGS DAILY. 60 each 5  . glucose blood (ACCU-CHEK AVIVA PLUS) test strip 1 each by Other route daily. E11.9 100 each 0  . OXYGEN Inhale 2-3 L into the lungs continuous. When exerting self     . potassium chloride SA (KLOR-CON) 20 MEQ tablet Take 1 tablet by mouth twice daily 180 tablet 0  . roflumilast (DALIRESP) 500 MCG TABS tablet Take 1 tablet (500 mcg total) by mouth daily. 90 tablet 1  . SitaGLIPtin-MetFORMIN HCl (JANUMET XR) 50-1000 MG TB24 Take 1 tablet by mouth daily. 180 tablet 3  . spironolactone (ALDACTONE) 25 MG tablet Take 1 tablet (25 mg total) by mouth daily. 30 tablet 0  . thiamine (VITAMIN B-1) 100 MG tablet Take 1 tablet (100 mg total) by mouth every other day. 45 tablet 1   No current facility-administered medications on file prior to visit.    Allergies  Allergen Reactions  . Benicar [Olmesartan] Swelling    Swelling of face and arms   . [name] [Valsartan] Swelling    Swelling of face and arms   . Hydrocodone-Acetaminophen Nausea And Vomiting    Severe vomiting  . Lisinopril Cough  . Monosodium Glutamate Other (See Comments)    Facial swelling per pt  . Codeine Other (See Comments)    jittery  . Lead Acetate Rash  . Nickel Rash    Severe rash to infection: pt is allergic to all metals other than sterling silver or gold jewelry.     Family History  Problem Relation Age of Onset  . Heart disease Mother        MI - fatal  . Hypertension Mother   . Stroke Father 59       fatal  . Alzheimer's disease Father   . Alzheimer's  disease Brother   . Hyperlipidemia Brother   . Hypertension Brother   . Diabetes Brother   . Hypertension Brother   . Hyperlipidemia Brother   . Breast cancer Paternal Grandmother     BP (!) 130/60   Pulse 82   Ht 5' 4.5" (1.638 m)   Wt (!) 213 lb (96.6 kg)   SpO2 93%   BMI 36.00 kg/m    Review of Systems She denies hypoglycemia and nausea.      Objective:   Physical Exam VITAL SIGNS:  See vs page GENERAL: no distress.  Has 02 on Pulses: dorsalis pedis intact bilat.   MSK: no deformity of the feet CV: trace bilat leg edema.   Skin:  no ulcer on the feet.  normal color and temp on the feet. Neuro: sensation is intact to touch on the feet.   Lab Results  Component Value Date   HGBA1C 6.8 (A) 12/07/2019   Lab Results  Component Value Date   CREATININE 0.77 09/19/2019   BUN 14 09/19/2019   NA 142 09/19/2019   K 4.4 09/19/2019   CL 102 09/19/2019   CO2 30 09/19/2019       Assessment & Plan:  Type 2 DM, with PAD: well-controlled.   Patient Instructions  Please continue the same medications.   check your blood sugar once a day.  vary the time of day when you check, between before the 3 meals, and at bedtime.  also check if you have symptoms of your blood sugar being too high or too low.  please keep a record of the readings and bring it to your next appointment here (or you  can bring the meter itself).  You can write it on any piece of paper.  please call us sooner if your blood sugar goes below 70, or if you have a lot of readings over 200. Please come back for a follow-up appointment in 3-4 months.

## 2019-12-12 DIAGNOSIS — J449 Chronic obstructive pulmonary disease, unspecified: Secondary | ICD-10-CM | POA: Diagnosis not present

## 2019-12-16 ENCOUNTER — Other Ambulatory Visit: Payer: Self-pay | Admitting: Internal Medicine

## 2019-12-16 DIAGNOSIS — I10 Essential (primary) hypertension: Secondary | ICD-10-CM

## 2019-12-18 ENCOUNTER — Ambulatory Visit: Payer: Medicare Other | Admitting: Internal Medicine

## 2019-12-19 ENCOUNTER — Ambulatory Visit: Payer: Medicare Other | Admitting: Internal Medicine

## 2019-12-20 ENCOUNTER — Ambulatory Visit: Payer: Medicare Other | Admitting: Adult Health

## 2019-12-20 ENCOUNTER — Encounter: Payer: Self-pay | Admitting: Adult Health

## 2019-12-20 ENCOUNTER — Other Ambulatory Visit: Payer: Self-pay

## 2019-12-20 DIAGNOSIS — G4733 Obstructive sleep apnea (adult) (pediatric): Secondary | ICD-10-CM

## 2019-12-20 DIAGNOSIS — J9611 Chronic respiratory failure with hypoxia: Secondary | ICD-10-CM | POA: Diagnosis not present

## 2019-12-20 DIAGNOSIS — J449 Chronic obstructive pulmonary disease, unspecified: Secondary | ICD-10-CM

## 2019-12-20 NOTE — Progress Notes (Signed)
'@Patient'  ID: Victoria [name], female    DOB: 1945-11-14, 74 y.o.   MRN: [id number]  Chief Complaint  Patient presents with  . Follow-up    COPD     Referring provider: Janith Lima, MD  HPI: 74 year old female former smoker followed for gold 3 COPD and oxygen dependent respiratory failure and obstructive sleep apnea on CPAP Breast cancer dx 2019 s/p lumpectomy (no chemo/xrt)   TEST/EVENTS :  Pulmonary function test June 2017: stage III COPD FEV1 0.88 L/47% in 10/21/2015 and 0.69 L/37% later in June 2017 CT chest lung cancer screening 10/09/2015: Emphysema with very small lung nodules Echo January 2016: Shows grade 1 diastolic dysfunction Nuclear medicine cardiac stress to 12/11/2015: Normal tests Lexiscan Myoview with ejection fraction 75% PSG 12/2014 >AHI 35/h 12/2018 Lung-RADS 2, benign appearance or behavior. Continue annual screening with low-dose chest CT without contrast in 12 months.   12/20/2019 Follow up : COPD, O2 RF , OSA  patient returns for a 29-monthfollow-up patient has severe COPD.  She remains on Trelegy daily.  She is on oxygen at 3 L on portable system/2l/ at home.   She says overall breathing has been doing about the same.  She gets winded with heavy activity.  No flare in cough or wheezing.   Remains very active. 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 Very active in the community.   Patient does have underlying sleep apnea.  Says she has not been wearing her CPAP. Discussed compliance and importance with patient education .   Covid vaccines are up-to-date.   Due for LDCT this month.   Allergies  Allergen Reactions  . Benicar [Olmesartan] Swelling    Swelling of face and arms   . [name] [Valsartan] Swelling    Swelling of face and arms   . Hydrocodone-Acetaminophen Nausea And Vomiting    Severe vomiting  . Lisinopril Cough  . Monosodium Glutamate Other (See Comments)    Facial swelling per pt    . Codeine Other (See Comments)    jittery  . Lead Acetate Rash  . Nickel Rash    Severe rash to infection: pt is allergic to all metals other than sterling silver or gold jewelry.     Immunization History  Administered Date(s) Administered  . Fluad Quad(high Dose 65+) 01/17/2019  . Influenza Split 01/16/2011  . Influenza, High Dose Seasonal PF 01/13/2017, 02/09/2018  . Influenza,inj,Quad PF,6+ Mos 02/01/2013, 02/27/2014, 04/07/2015, 01/08/2016  . PFIZER SARS-COV-2 Vaccination 06/06/2019, 06/27/2019  . Pneumococcal Conjugate-13 02/01/2013  . Pneumococcal Polysaccharide-23 10/29/2014  . Td 12/06/2011    Past Medical History:  Diagnosis Date  . [place]    Dr. FOneida Alarfollows lLOV 2 ''17 per pt "around 2 cm"  . Anemia    as a child  . Arthritis    left ankle, right knee, right SI joint, wrists, lower back  . Breast cancer in female ([facility]    Left  . COPD (chronic obstructive pulmonary disease) ([place]    ephysema-Dr. RChase Caller . Dyspnea   . Headache    as a child would have terrible headaches during season changes  . 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 Hyperlipidemia   . Hypertension   . Multiple thyroid nodules   . Murmur, cardiac 1950  . Osteoporosis   . Pneumonia   . Pre-diabetes   . Requires continuous at home supplemental oxygen  2 L 24/7  . Sebaceous cyst    hairline sebaceous cyst left posterior neck to be excised 04-13-16 by Dr. Harlow Asa in [place] [facility]  . Sleep apnea    cpap used sometimes, uses oxygen concentrator 2 l/m nasally bedtime  . Varicella as child    Tobacco History: Social History   Tobacco Use  Smoking Status Former Smoker  . Packs/day: 1.00  . Years: 50.00  . Pack years: 50.00  . Types: Cigarettes  . Quit date: 04/16/2011  . Years since quitting: 8.6  Smokeless Tobacco Never Used  Tobacco Comment   quit that date when she had to go to ER    Counseling given: Not Answered Comment: quit that date when she  had to go to ER    Outpatient Medications Prior to Visit  Medication Sig Dispense Refill  . Accu-Chek FastClix Lancets MISC 1 each by Does not apply route daily. Use to monitor glucose levels once per day; E11.9 102 each 2  . acetaminophen (TYLENOL) 500 MG tablet Take 1,000 mg by mouth every 6 (six) hours as needed.    . Albuterol Sulfate (PROAIR RESPICLICK) 448 (90 Base) MCG/ACT AEPB Inhale 1-2 puffs into the lungs every 6 (six) hours as needed (for wheezing/shortness of breath). 1 each 0  . amLODipine (NORVASC) 10 MG tablet Take 1 tablet by mouth once daily 90 tablet 0  . anastrozole (ARIMIDEX) 1 MG tablet Take 1 tablet by mouth once daily 90 tablet 0  . aspirin EC 81 MG tablet Take 1 tablet (81 mg total) by mouth daily. 90 tablet 3  . atorvastatin (LIPITOR) 80 MG tablet Take 1 tablet (80 mg total) by mouth daily at 6 PM. 90 tablet 3  . Blood Glucose Monitoring Suppl (ACCU-CHEK AVIVA PLUS) w/Device KIT 1 Device by Does not apply route daily. Use to monitor glucose levels once per day; E11.9 1 kit 0  . carvedilol (COREG) 3.125 MG tablet Take 1 tablet (3.125 mg total) by mouth 2 (two) times daily. 180 tablet 3  . Cholecalciferol (VITAMIN D) 125 MCG (5000 UT) CAPS Take 1 capsule by mouth daily.    [name] denosumab (PROLIA) 60 MG/ML SOLN injection Inject 60 mg into the skin every 6 (six) months. Administer in upper arm, thigh, or abdomen    . Fluticasone-Umeclidin-Vilant (TRELEGY ELLIPTA) 100-62.5-25 MCG/INH AEPB INHALE 1 DOSE INTO THE LUNGS DAILY. 60 each 5  . glucose blood (ACCU-CHEK AVIVA PLUS) test strip 1 each by Other route daily. E11.9 100 each 0  . OXYGEN Inhale 2-3 L into the lungs continuous. When exerting self     . potassium chloride SA (KLOR-CON) 20 MEQ tablet Take 1 tablet by mouth twice daily 180 tablet 0  . roflumilast (DALIRESP) 500 MCG TABS tablet Take 1 tablet (500 mcg total) by mouth daily. 90 tablet 1  . SitaGLIPtin-MetFORMIN HCl (JANUMET XR) 50-1000 MG TB24 Take 1 tablet by mouth  daily. 180 tablet 3  . spironolactone (ALDACTONE) 25 MG tablet Take 1 tablet (25 mg total) by mouth daily. 30 tablet 0  . thiamine (VITAMIN B-1) 100 MG tablet Take 1 tablet (100 mg total) by mouth every other day. 45 tablet 1   No facility-administered medications prior to visit.     Review of Systems:   Constitutional:   No  weight loss, night sweats,  Fevers, chills,  +fatigue, or  lassitude.  HEENT:   No headaches,  Difficulty swallowing,  Tooth/dental problems, or  Sore throat,  No sneezing, itching, ear ache, nasal congestion, post nasal drip,   CV:  No chest pain,  Orthopnea, PND, swelling in lower extremities, anasarca, dizziness, palpitations, syncope.   GI  No heartburn, indigestion, abdominal pain, nausea, vomiting, diarrhea, change in bowel habits, loss of appetite, bloody stools.   Resp:   No chest wall deformity  Skin: no rash or lesions.  GU: no dysuria, change in color of urine, no urgency or frequency.  No flank pain, no hematuria   MS:  No joint pain or swelling.  No decreased range of motion.  No back pain.    Physical Exam  BP 127/70 (BP Location: Left Arm, Cuff Size: Normal)   Pulse 86   Temp 97.6 F (36.4 C) (Oral)   Ht 5' 4.5" (1.638 m)   Wt 212 lb 12.8 oz (96.5 kg)   SpO2 98%   BMI 35.96 kg/m   GEN: A/Ox3; pleasant , NAD, elderly , on O2    HEENT:  [place]/AT,   , NOSE-clear, THROAT-clear, no lesions, no postnasal drip or exudate noted.   NECK:  Supple w/ fair ROM; no JVD; normal carotid impulses w/o bruits; no thyromegaly or nodules palpated; no lymphadenopathy.    RESP  Clear  P & A; w/o, wheezes/ rales/ or rhonchi. no accessory muscle use, no dullness to percussion  CARD:  RRR, no m/r/g, tr  peripheral edema, pulses intact, no cyanosis or clubbing.  GI:   Soft & nt; nml bowel sounds; no organomegaly or masses detected.   Musco: Warm bil, no deformities or joint swelling noted.   Neuro: alert, no focal deficits noted.     Skin: Warm, no lesions or rashes    Lab Results:  CBC   BNP No results found for: BNP  ProBNP No results found for: PROBNP  Imaging:    PFT Results Latest Ref Rng & Units 04/15/2016 11/05/2015 10/21/2015  FVC-Pre L 1.41 1.50 1.66  FVC-Predicted Pre % 58 63 69  FVC-Post L 1.38 1.50 1.83  FVC-Predicted Post % 57 63 77  Pre FEV1/FVC % % 45 41 44  Post FEV1/FCV % % 47 46 48  FEV1-Pre L 0.64 0.62 0.73  FEV1-Predicted Pre % 34 33 39  FEV1-Post L 0.65 0.69 0.88  DLCO uncorrected ml/min/mmHg 9.45 - -  DLCO UNC% % 36 - -  DLCO corrected ml/min/mmHg 9.47 - -  DLCO COR %Predicted % 37 - -  DLVA Predicted % 60 - -    No results found for: NITRICOXIDE      Assessment & Plan:   COPD, severe (HCC) Doing very well, remains active  Cont w/ LDCT screening   Plan  There are no Patient Instructions on file for this visit.      Rexene Edison, NP 12/20/2019

## 2019-12-20 NOTE — Assessment & Plan Note (Signed)
Continue on O2   Plan  Patient Instructions  Continue on TRELEGY 1 puff daily , rinse after use.  Activity as tolerated.  Continue oxygen 2l/m . (3l/m on POC) . LDCT Chest this month .   Follow up with Dr. Chase Caller in 4 months and As needed

## 2019-12-20 NOTE — Patient Instructions (Addendum)
Continue on TRELEGY 1 puff daily , rinse after use.  Activity as tolerated.  Continue oxygen 2l/m . (3l/m on POC) . LDCT Chest this month .   Restart CPAP At bedtime   Work on healthy weight  Do not drive if sleepy .  Follow up with Dr. Chase Caller in 4 months and As needed

## 2019-12-20 NOTE — Assessment & Plan Note (Addendum)
Doing very well, remains active  Cont w/ LDCT screening   Plan  Patient Instructions  Continue on TRELEGY 1 puff daily , rinse after use.  Activity as tolerated.  Continue oxygen 2l/m . (3l/m on POC) . LDCT Chest this month .   Follow up with Dr. Chase Caller in 4 months and As needed

## 2019-12-20 NOTE — Assessment & Plan Note (Signed)
Encouraged on compliance with CPAP  Patient education given   Plan  Patient Instructions  Continue on TRELEGY 1 puff daily , rinse after use.  Activity as tolerated.  Continue oxygen 2l/m . (3l/m on POC) . LDCT Chest this month .   Follow up with Dr. Chase Caller in 4 months and As needed

## 2019-12-24 ENCOUNTER — Encounter: Payer: Self-pay | Admitting: Internal Medicine

## 2019-12-24 ENCOUNTER — Ambulatory Visit (INDEPENDENT_AMBULATORY_CARE_PROVIDER_SITE_OTHER): Payer: Medicare Other | Admitting: Internal Medicine

## 2019-12-24 ENCOUNTER — Other Ambulatory Visit: Payer: Self-pay

## 2019-12-24 VITALS — BP 148/58 | HR 85 | Temp 98.6°F | Resp 16 | Ht 64.5 in | Wt 213.0 lb

## 2019-12-24 DIAGNOSIS — E785 Hyperlipidemia, unspecified: Secondary | ICD-10-CM | POA: Diagnosis not present

## 2019-12-24 DIAGNOSIS — E118 Type 2 diabetes mellitus with unspecified complications: Secondary | ICD-10-CM

## 2019-12-24 DIAGNOSIS — I10 Essential (primary) hypertension: Secondary | ICD-10-CM

## 2019-12-24 DIAGNOSIS — T502X5A Adverse effect of carbonic-anhydrase inhibitors, benzothiadiazides and other diuretics, initial encounter: Secondary | ICD-10-CM

## 2019-12-24 DIAGNOSIS — Z0000 Encounter for general adult medical examination without abnormal findings: Secondary | ICD-10-CM

## 2019-12-24 DIAGNOSIS — E519 Thiamine deficiency, unspecified: Secondary | ICD-10-CM

## 2019-12-24 DIAGNOSIS — E876 Hypokalemia: Secondary | ICD-10-CM

## 2019-12-24 DIAGNOSIS — I700 Atherosclerosis of aorta: Secondary | ICD-10-CM

## 2019-12-24 LAB — BASIC METABOLIC PANEL WITH GFR
BUN: 13 mg/dL (ref 7–25)
CO2: 32 mmol/L (ref 20–32)
Calcium: 9.7 mg/dL (ref 8.6–10.4)
Chloride: 101 mmol/L (ref 98–110)
Creat: 0.74 mg/dL (ref 0.60–0.93)
GFR, Est African American: 92 mL/min/{1.73_m2} (ref >=60)
GFR, Est Non African American: 79 mL/min/{1.73_m2} (ref >=60)
Glucose, Bld: 137 mg/dL — ABNORMAL HIGH (ref 65–99)
Potassium: 4.4 mmol/L (ref 3.5–5.3)
Sodium: 138 mmol/L (ref 135–146)

## 2019-12-24 LAB — CBC WITH DIFFERENTIAL/PLATELET
Absolute Monocytes: 573 cells/uL (ref 200–950)
Basophils Absolute: 42 cells/uL (ref 0–200)
Basophils Relative: 0.5 %
Eosinophils Absolute: 183 cells/uL (ref 15–500)
Eosinophils Relative: 2.2 %
HCT: 35.5 % (ref 35.0–45.0)
Hemoglobin: 11.7 g/dL (ref 11.7–15.5)
Lymphs Abs: 1569 cells/uL (ref 850–3900)
MCH: 28.3 pg (ref 27.0–33.0)
MCHC: 33 g/dL (ref 32.0–36.0)
MCV: 85.7 fL (ref 80.0–100.0)
MPV: 10.4 fL (ref 7.5–12.5)
Monocytes Relative: 6.9 %
Neutro Abs: 5935 cells/uL (ref 1500–7800)
Neutrophils Relative %: 71.5 %
Platelets: 247 10*3/uL (ref 140–400)
RBC: 4.14 10*6/uL (ref 3.80–5.10)
RDW: 13.8 % (ref 11.0–15.0)
Total Lymphocyte: 18.9 %
WBC: 8.3 10*3/uL (ref 3.8–10.8)

## 2019-12-24 LAB — LIPID PANEL
Cholesterol: 170 mg/dL (ref <200)
HDL: 48 mg/dL — ABNORMAL LOW (ref >=50)
LDL Cholesterol (Calc): 92 mg/dL (calc)
Non-HDL Cholesterol (Calc): 122 mg/dL (calc) (ref <130)
Total CHOL/HDL Ratio: 3.5 (calc) (ref <5.0)
Triglycerides: 201 mg/dL — ABNORMAL HIGH (ref <150)

## 2019-12-24 LAB — TSH: TSH: 1.38 mIU/L (ref 0.40–4.50)

## 2019-12-24 MED ORDER — SPIRONOLACTONE 25 MG PO TABS: 25.0000 mg | ORAL_TABLET | Freq: Every day | ORAL | 1 refills | Status: DC

## 2019-12-24 MED ORDER — VITAMIN B-1 100 MG PO TABS: 100.0000 mg | ORAL_TABLET | ORAL | 1 refills | Status: DC

## 2019-12-24 MED ORDER — AMLODIPINE BESYLATE 10 MG PO TABS: 10.0000 mg | ORAL_TABLET | Freq: Every day | ORAL | 1 refills | Status: DC

## 2019-12-24 MED ORDER — POTASSIUM CHLORIDE CRYS ER 20 MEQ PO TBCR: 20.0000 meq | EXTENDED_RELEASE_TABLET | Freq: Two times a day (BID) | ORAL | 1 refills | Status: DC

## 2019-12-24 NOTE — Progress Notes (Signed)
Subjective:  Patient ID: Victoria [name], female    DOB: 1945/10/22  Age: 74 y.o. MRN: [id number]  CC: Annual Exam, Diabetes, Hypertension, Anemia, and Hyperlipidemia  This visit occurred during the SARS-CoV-2 public health emergency.  Safety protocols were in place, including screening questions prior to the visit, additional usage of staff PPE, and extensive cleaning of exam room while observing appropriate contact time as indicated for disinfecting solutions.    HPI Victoria [name] presents for a CPX.  She has her baseline level of shortness of breath.  She denies any recent episodes of cough, chest pain, hemoptysis, fever, chills, dizziness, or lightheadedness.   Outpatient Medications Prior to Visit  Medication Sig Dispense Refill  . Accu-Chek FastClix Lancets MISC 1 each by Does not apply route daily. Use to monitor glucose levels once per day; E11.9 102 each 2  . acetaminophen (TYLENOL) 500 MG tablet Take 1,000 mg by mouth every 6 (six) hours as needed.    . Albuterol Sulfate (PROAIR RESPICLICK) 510 (90 Base) MCG/ACT AEPB Inhale 1-2 puffs into the lungs every 6 (six) hours as needed (for wheezing/shortness of breath). 1 each 0  . anastrozole (ARIMIDEX) 1 MG tablet Take 1 tablet by mouth once daily 90 tablet 0  . aspirin EC 81 MG tablet Take 1 tablet (81 mg total) by mouth daily. 90 tablet 3  . atorvastatin (LIPITOR) 80 MG tablet Take 1 tablet (80 mg total) by mouth daily at 6 PM. 90 tablet 3  . Blood Glucose Monitoring Suppl (ACCU-CHEK AVIVA PLUS) w/Device KIT 1 Device by Does not apply route daily. Use to monitor glucose levels once per day; E11.9 1 kit 0  . carvedilol (COREG) 3.125 MG tablet Take 1 tablet (3.125 mg total) by mouth 2 (two) times daily. 180 tablet 3  . Cholecalciferol (VITAMIN D) 125 MCG (5000 UT) CAPS Take 1 capsule by mouth daily.    [name] denosumab (PROLIA) 60 MG/ML SOLN injection Inject 60 mg into the skin every 6 (six) months. Administer in upper arm,  thigh, or abdomen    . Fluticasone-Umeclidin-Vilant (TRELEGY ELLIPTA) 100-62.5-25 MCG/INH AEPB INHALE 1 DOSE INTO THE LUNGS DAILY. 60 each 5  . glucose blood (ACCU-CHEK AVIVA PLUS) test strip 1 each by Other route daily. E11.9 100 each 0  . OXYGEN Inhale 2-3 L into the lungs continuous. When exerting self     . roflumilast (DALIRESP) 500 MCG TABS tablet Take 1 tablet (500 mcg total) by mouth daily. 90 tablet 1  . SitaGLIPtin-MetFORMIN HCl (JANUMET XR) 50-1000 MG TB24 Take 1 tablet by mouth daily. 180 tablet 3  . amLODipine (NORVASC) 10 MG tablet Take 1 tablet by mouth once daily 90 tablet 0  . potassium chloride SA (KLOR-CON) 20 MEQ tablet Take 1 tablet by mouth twice daily 180 tablet 0  . spironolactone (ALDACTONE) 25 MG tablet Take 1 tablet (25 mg total) by mouth daily. 30 tablet 0  . thiamine (VITAMIN B-1) 100 MG tablet Take 1 tablet (100 mg total) by mouth every other day. 45 tablet 1   No facility-administered medications prior to visit.    ROS Review of Systems  Constitutional: Negative for appetite change, chills, diaphoresis, fatigue and fever.  HENT: Negative.  Negative for trouble swallowing.   Eyes: Negative for visual disturbance.  Respiratory: Positive for shortness of breath. Negative for chest tightness and wheezing.   Cardiovascular: Negative for chest pain, palpitations and leg swelling.  Gastrointestinal: Negative for abdominal pain, constipation, diarrhea and nausea.  Endocrine:  Negative.   Genitourinary: Negative for difficulty urinating, dysuria and hematuria.  Musculoskeletal: Negative.  Negative for arthralgias, back pain and neck pain.  Neurological: Negative.  Negative for dizziness, weakness, light-headedness and headaches.  Hematological: Negative for adenopathy. Does not bruise/bleed easily.  Psychiatric/Behavioral: Negative.     Objective:  BP (!) 148/58 (BP Location: Left Arm, Patient Position: Sitting, Cuff Size: Large)   Pulse 85   Temp 98.6 F (37 C)  (Oral)   Resp 16   Ht 5' 4.5" (1.638 m)   Wt 213 lb (96.6 kg)   SpO2 96%   BMI 36.00 kg/m   BP Readings from Last 3 Encounters:  12/24/19 (!) 148/58  12/20/19 127/70  12/07/19 (!) 130/60    Wt Readings from Last 3 Encounters:  12/24/19 213 lb (96.6 kg)  12/20/19 212 lb 12.8 oz (96.5 kg)  12/07/19 (!) 213 lb (96.6 kg)    Physical Exam Constitutional:      General: She is not in acute distress.    Appearance: She is ill-appearing (O2 dependent, in a wheel chair). She is not toxic-appearing or diaphoretic.  HENT:     Nose: Nose normal.     Mouth/Throat:     Mouth: Mucous membranes are moist.  Eyes:     General: No scleral icterus.    Conjunctiva/sclera: Conjunctivae normal.  Cardiovascular:     Rate and Rhythm: Normal rate and regular rhythm.     Heart sounds: No murmur heard.   Pulmonary:     Effort: Accessory muscle usage present. No tachypnea.     Breath sounds: Examination of the right-lower field reveals decreased breath sounds. Examination of the left-lower field reveals decreased breath sounds. Decreased breath sounds present. No wheezing, rhonchi or rales.  Abdominal:     General: Abdomen is flat.     Tenderness: There is no abdominal tenderness.  Musculoskeletal:        General: Normal range of motion.     Cervical back: Neck supple.     Right lower leg: No edema.     Left lower leg: No edema.  Lymphadenopathy:     Cervical: No cervical adenopathy.  Skin:    General: Skin is warm and dry.  Neurological:     General: No focal deficit present.     Mental Status: She is alert.     Lab Results  Component Value Date   WBC 8.3 12/24/2019   HGB 11.7 12/24/2019   HCT 35.5 12/24/2019   PLT 247 12/24/2019   GLUCOSE 137 (H) 12/24/2019   CHOL 170 12/24/2019   TRIG 201 (H) 12/24/2019   HDL 48 (L) 12/24/2019   LDLDIRECT 121.0 09/05/2014   LDLCALC 92 12/24/2019   ALT <6 09/19/2019   AST 10 (L) 09/19/2019   NA 138 12/24/2019   K 4.4 12/24/2019   CL 101  12/24/2019   CREATININE 0.74 12/24/2019   BUN 13 12/24/2019   CO2 32 12/24/2019   TSH 1.38 12/24/2019   INR 1.01 05/28/2014   HGBA1C 6.8 (A) 12/07/2019   MICROALBUR 1.4 04/19/2019    MM DIAG BREAST TOMO BILATERAL  Result Date: 12/05/2019 CLINICAL DATA:  Status post left lumpectomy in 2019 without chemotherapy or radiation therapy. She is currently taking anastrozole. EXAM: DIGITAL DIAGNOSTIC BILATERAL MAMMOGRAM WITH TOMO AND CAD COMPARISON:  Previous exam(s). ACR Breast Density Category b: There are scattered areas of fibroglandular density. FINDINGS: Stable post lumpectomy changes on the left. No interval findings suspicious for malignancy in either breast.  Mammographic images were processed with CAD. IMPRESSION: No evidence of malignancy. RECOMMENDATION: Bilateral diagnostic mammogram in 1 year. I have discussed the findings and recommendations with the patient. If applicable, a reminder letter will be sent to the patient regarding the next appointment. BI-RADS CATEGORY  2: Benign. Electronically Signed   By: Claudie Revering M.D.   On: 12/05/2019 10:24    Assessment & Plan:   Victoria [name] was seen today for annual exam, diabetes, hypertension, anemia and hyperlipidemia.  Diagnoses and all orders for this visit:  Thiamine deficiency -     thiamine (VITAMIN B-1) 100 MG tablet; Take 1 tablet (100 mg total) by mouth every other day. -     CBC with Differential/Platelet; Future -     CBC with Differential/Platelet  Essential hypertension- Her blood pressure is adequately well controlled.  Electrolytes and renal function are normal. -     amLODipine (NORVASC) 10 MG tablet; Take 1 tablet (10 mg total) by mouth daily. -     spironolactone (ALDACTONE) 25 MG tablet; Take 1 tablet (25 mg total) by mouth daily. -     CBC with Differential/Platelet; Future -     BASIC METABOLIC PANEL WITH GFR; Future -     TSH; Future -     TSH -     BASIC METABOLIC PANEL WITH GFR -     CBC with  Differential/Platelet  Hypokalemia- Her potassium level is normal at 4.4.  Will continue the combination of a potassium supplement and spironolactone. -     potassium chloride SA (KLOR-CON) 20 MEQ tablet; Take 1 tablet (20 mEq total) by mouth 2 (two) times daily. -     BASIC METABOLIC PANEL WITH GFR; Future -     BASIC METABOLIC PANEL WITH GFR  Diuretic-induced hypokalemia- See above. -     spironolactone (ALDACTONE) 25 MG tablet; Take 1 tablet (25 mg total) by mouth daily.  Type II diabetes mellitus with manifestations ([place])- Her blood sugar is adequately well controlled. -     BASIC METABOLIC PANEL WITH GFR; Future -     BASIC METABOLIC PANEL WITH GFR  Routine health maintenance- Exam completed, labs reviewed, vaccines reviewed and updated, cancer screenings are up-to-date, patient education material was given.  Hyperlipidemia with target LDL less than 100- She has achieved her LDL goal is doing well on the statin. -     Lipid panel; Future -     TSH; Future -     TSH -     Lipid panel  Aortic atherosclerosis ([place])- Will continue to address risk factor modifications. -     Lipid panel; Future -     Lipid panel   I have changed Victoria [name]'s amLODipine and potassium chloride SA. I am also having her maintain her OXYGEN, denosumab, acetaminophen, Albuterol Sulfate, atorvastatin, Accu-Chek FastClix Lancets, Vitamin D, Janumet XR, Accu-Chek Aviva Plus, Accu-Chek Aviva Plus, carvedilol, aspirin EC, Daliresp, Trelegy Ellipta, anastrozole, spironolactone, and thiamine.  Meds ordered this encounter  Medications  . amLODipine (NORVASC) 10 MG tablet    Sig: Take 1 tablet (10 mg total) by mouth daily.    Dispense:  90 tablet    Refill:  1  . potassium chloride SA (KLOR-CON) 20 MEQ tablet    Sig: Take 1 tablet (20 mEq total) by mouth 2 (two) times daily.    Dispense:  180 tablet    Refill:  1  . spironolactone (ALDACTONE) 25 MG tablet    Sig: Take 1 tablet (  25 mg total) by mouth  daily.    Dispense:  90 tablet    Refill:  1  . thiamine (VITAMIN B-1) 100 MG tablet    Sig: Take 1 tablet (100 mg total) by mouth every other day.    Dispense:  45 tablet    Refill:  1     Follow-up: Return in about 6 months (around 06/25/2020).  Victoria Calico, MD

## 2019-12-24 NOTE — Patient Instructions (Signed)
Health Maintenance, Female Adopting a healthy lifestyle and getting preventive care are important in promoting health and wellness. Ask your health care provider about:  The right schedule for you to have regular tests and exams.  Things you can do on your own to prevent diseases and keep yourself healthy. What should I know about diet, weight, and exercise? Eat a healthy diet   Eat a diet that includes plenty of vegetables, fruits, low-fat dairy products, and lean protein.  Do not eat a lot of foods that are high in solid fats, added sugars, or sodium. Maintain a healthy weight Body mass index (BMI) is used to identify weight problems. It estimates body fat based on height and weight. Your health care provider can help determine your BMI and help you achieve or maintain a healthy weight. Get regular exercise Get regular exercise. This is one of the most important things you can do for your health. Most adults should:  Exercise for at least 150 minutes each week. The exercise should increase your heart rate and make you sweat (moderate-intensity exercise).  Do strengthening exercises at least twice a week. This is in addition to the moderate-intensity exercise.  Spend less time sitting. Even light physical activity can be beneficial. Watch cholesterol and blood lipids Have your blood tested for lipids and cholesterol at 76 years of age, then have this test every 5 years. Have your cholesterol levels checked more often if:  Your lipid or cholesterol levels are high.  You are older than 76 years of age.  You are at high risk for heart disease. What should I know about cancer screening? Depending on your health history and family history, you may need to have cancer screening at various ages. This may include screening for:  Breast cancer.  Cervical cancer.  Colorectal cancer.  Skin cancer.  Lung cancer. What should I know about heart disease, diabetes, and high blood  pressure? Blood pressure and heart disease  High blood pressure causes heart disease and increases the risk of stroke. This is more likely to develop in people who have high blood pressure readings, are of African descent, or are overweight.  Have your blood pressure checked: ? Every 3-5 years if you are 18-39 years of age. ? Every year if you are 40 years old or older. Diabetes Have regular diabetes screenings. This checks your fasting blood sugar level. Have the screening done:  Once every three years after age 40 if you are at a normal weight and have a low risk for diabetes.  More often and at a younger age if you are overweight or have a high risk for diabetes. What should I know about preventing infection? Hepatitis B If you have a higher risk for hepatitis B, you should be screened for this virus. Talk with your health care provider to find out if you are at risk for hepatitis B infection. Hepatitis C Testing is recommended for:  Everyone born from 1945 through 1965.  Anyone with known risk factors for hepatitis C. Sexually transmitted infections (STIs)  Get screened for STIs, including gonorrhea and chlamydia, if: ? You are sexually active and are younger than 76 years of age. ? You are older than 76 years of age and your health care provider tells you that you are at risk for this type of infection. ? Your sexual activity has changed since you were last screened, and you are at increased risk for chlamydia or gonorrhea. Ask your health care provider if   you are at risk.  Ask your health care provider about whether you are at high risk for HIV. Your health care provider may recommend a prescription medicine to help prevent HIV infection. If you choose to take medicine to prevent HIV, you should first get tested for HIV. You should then be tested every 3 months for as long as you are taking the medicine. Pregnancy  If you are about to stop having your period (premenopausal) and  you may become pregnant, seek counseling before you get pregnant.  Take 400 to 800 micrograms (mcg) of folic acid every day if you become pregnant.  Ask for birth control (contraception) if you want to prevent pregnancy. Osteoporosis and menopause Osteoporosis is a disease in which the bones lose minerals and strength with aging. This can result in bone fractures. If you are 65 years old or older, or if you are at risk for osteoporosis and fractures, ask your health care provider if you should:  Be screened for bone loss.  Take a calcium or vitamin D supplement to lower your risk of fractures.  Be given hormone replacement therapy (HRT) to treat symptoms of menopause. Follow these instructions at home: Lifestyle  Do not use any products that contain nicotine or tobacco, such as cigarettes, e-cigarettes, and chewing tobacco. If you need help quitting, ask your health care provider.  Do not use street drugs.  Do not share needles.  Ask your health care provider for help if you need support or information about quitting drugs. Alcohol use  Do not drink alcohol if: ? Your health care provider tells you not to drink. ? You are pregnant, may be pregnant, or are planning to become pregnant.  If you drink alcohol: ? Limit how much you use to 0-1 drink a day. ? Limit intake if you are breastfeeding.  Be aware of how much alcohol is in your drink. In the [place] one drink equals one 12 oz bottle of beer (355 mL), one 5 oz glass of wine (148 mL), or one 1 oz glass of hard liquor (44 mL). General instructions  Schedule regular health, dental, and eye exams.  Stay current with your vaccines.  Tell your health care provider if: ? You often feel depressed. ? You have ever been abused or do not feel safe at home. Summary  Adopting a healthy lifestyle and getting preventive care are important in promoting health and wellness.  Follow your health care provider's instructions about healthy  diet, exercising, and getting tested or screened for diseases.  Follow your health care provider's instructions on monitoring your cholesterol and blood pressure. This information is not intended to replace advice given to you by your health care provider. Make sure you discuss any questions you have with your health care provider. Document Revised: 04/26/2018 Document Reviewed: 04/26/2018 Elsevier Patient Education  2020 [employer]  

## 2019-12-26 ENCOUNTER — Telehealth: Payer: Self-pay

## 2019-12-26 NOTE — Telephone Encounter (Signed)
New message    Calling for test results  

## 2019-12-28 NOTE — Telephone Encounter (Signed)
Pt informed of normal results per Dr. Ronnald Ramp.

## 2020-01-11 ENCOUNTER — Ambulatory Visit (INDEPENDENT_AMBULATORY_CARE_PROVIDER_SITE_OTHER)
Admission: RE | Admit: 2020-01-11 | Discharge: 2020-01-11 | Disposition: A | Discharge status: Home / Self Care | Payer: Medicare Other | Source: Ambulatory Visit | Attending: Acute Care | Admitting: Acute Care

## 2020-01-11 ENCOUNTER — Other Ambulatory Visit: Payer: Self-pay

## 2020-01-11 DIAGNOSIS — Z87891 Personal history of nicotine dependence: Secondary | ICD-10-CM

## 2020-01-11 DIAGNOSIS — Z122 Encounter for screening for malignant neoplasm of respiratory organs: Secondary | ICD-10-CM

## 2020-01-12 DIAGNOSIS — J449 Chronic obstructive pulmonary disease, unspecified: Secondary | ICD-10-CM | POA: Diagnosis not present

## 2020-01-18 ENCOUNTER — Telehealth: Payer: Self-pay | Admitting: Internal Medicine

## 2020-01-18 ENCOUNTER — Telehealth: Payer: Self-pay | Admitting: Adult Health

## 2020-01-18 DIAGNOSIS — I10 Essential (primary) hypertension: Secondary | ICD-10-CM

## 2020-01-18 DIAGNOSIS — Z87891 Personal history of nicotine dependence: Secondary | ICD-10-CM

## 2020-01-18 NOTE — Progress Notes (Signed)
  Chronic Care Management   Outreach Note  01/18/2020 Name: Victoria [name] MRN: [id number] DOB: 07-26-1945  Referred by: Janith Lima, MD Reason for referral : No chief complaint on file.   An unsuccessful telephone outreach was attempted today. The patient was referred to the pharmacist for assistance with care management and care coordination.   Follow Up Plan:   Earney Hamburg [facility] Scheduler

## 2020-01-18 NOTE — Progress Notes (Signed)
  Chronic Care Management   Note  01/18/2020 Name: Keiley Levey MRN: [id number] DOB: 06-May-1946  Emagene Merfeld is a 74 y.o. year old female who is a primary care patient of Janith Lima, MD. I reached out to [name] by phone today in response to a referral sent by Ms. Jolly Mango Foot's PCP, Janith Lima, MD.   Ms. Denno was given information about Chronic Care Management services today including:  1. CCM service includes personalized support from designated clinical staff supervised by her physician, including individualized plan of care and coordination with other care providers 2. 24/7 contact phone numbers for assistance for urgent and routine care needs. 3. Service will only be billed when office clinical staff spend 20 minutes or more in a month to coordinate care. 4. Only one practitioner may furnish and bill the service in a calendar month. 5. The patient may stop CCM services at any time (effective at the end of the month) by phone call to the office staff.   Patient agreed to services and verbal consent obtained.   Follow up plan:   Earney Hamburg [facility] Scheduler

## 2020-01-18 NOTE — Telephone Encounter (Signed)
Tried calling the pt and there was no answer- LMTCB x 1  

## 2020-01-22 NOTE — Telephone Encounter (Signed)
Thanks so much. 

## 2020-01-22 NOTE — Telephone Encounter (Signed)
Spoke with pt and advised of CT results. Pt is aware we will repeat CT in 6 months. Copy sent to PCP. Order has been placed for 6 mth f/u Ct. Will forward to Maggie Schwalbe to make her aware.

## 2020-01-22 NOTE — Progress Notes (Signed)
See telephone note dated 01/18/2020 >> results called to patient

## 2020-01-22 NOTE — Telephone Encounter (Signed)
Pt returning call.  [phone]

## 2020-01-23 ENCOUNTER — Telehealth: Payer: Self-pay

## 2020-01-23 NOTE — Telephone Encounter (Signed)
LVM to RTN my call to R/S AWV with [facility]

## 2020-02-08 DIAGNOSIS — G4733 Obstructive sleep apnea (adult) (pediatric): Secondary | ICD-10-CM | POA: Diagnosis not present

## 2020-02-12 DIAGNOSIS — J449 Chronic obstructive pulmonary disease, unspecified: Secondary | ICD-10-CM | POA: Diagnosis not present

## 2020-02-20 ENCOUNTER — Ambulatory Visit (INDEPENDENT_AMBULATORY_CARE_PROVIDER_SITE_OTHER): Payer: Medicare Other

## 2020-02-20 ENCOUNTER — Other Ambulatory Visit: Payer: Self-pay

## 2020-02-20 DIAGNOSIS — Z0000 Encounter for general adult medical examination without abnormal findings: Secondary | ICD-10-CM

## 2020-02-20 NOTE — Progress Notes (Signed)
I connected with Victoria [name] today by telephone and verified that I am speaking with the correct person using two identifiers. Location patient: home Location provider: work Persons participating in the virtual visit: Allayne Gitelman and  Lisette Abu, LPN.  I discussed the limitations, risks, security and privacy concerns of performing an evaluation and management service by telephone and the availability of in person appointments. I also discussed with the patient that there may be a patient responsible charge related to this service. The patient expressed understanding and verbally consented to this telephonic visit.    Interactive audio and video telecommunications were attempted between this provider and patient, however failed, due to patient having technical difficulties OR patient did not have access to video capability.  We continued and completed visit with audio only.  Some vital signs may be absent or patient reported.   Time Spent with patient on telephone encounter: 30 minutes  Subjective:   Victoria [name] is a 74 y.o. female who presents for Medicare Annual (Subsequent) preventive examination.  Review of Systems    No ROS. Medicare Wellness Visit. Cardiac Risk Factors include: advanced age (>9mn, >>63women);diabetes mellitus;dyslipidemia;family history of premature cardiovascular disease;hypertension;obesity (BMI >30kg/m2)     Objective:    There were no vitals filed for this visit. There is no height or weight on file to calculate BMI.  Advanced Directives 02/20/2020 06/12/2018 08/31/2017 08/10/2017 08/08/2017 06/07/2017 04/18/2017  Does Patient Have a Medical Advance Directive? Yes No Yes Yes Yes Yes No  Type of Advance Directive [place] will Living will [place] will -  Does patient want to make changes to medical advance directive? No - Patient declined - No - Patient declined No - Patient  declined No - Patient declined - -  Copy of [place] Chart? No - copy requested - No - copy requested No - copy requested - No - copy requested -  Would patient like information on creating a medical advance directive? - Yes (ED - Information included in AVS) - - - - No - Patient declined    Current Medications (verified) Outpatient Encounter Medications as of 02/20/2020  Medication Sig   Accu-Chek FastClix Lancets MISC 1 each by Does not apply route daily. Use to monitor glucose levels once per day; E11.9   acetaminophen (TYLENOL) 500 MG tablet Take 1,000 mg by mouth every 6 (six) hours as needed.   Albuterol Sulfate (PROAIR RESPICLICK) 1287(90 Base) MCG/ACT AEPB Inhale 1-2 puffs into the lungs every 6 (six) hours as needed (for wheezing/shortness of breath).   amLODipine (NORVASC) 10 MG tablet Take 1 tablet (10 mg total) by mouth daily.   anastrozole (ARIMIDEX) 1 MG tablet Take 1 tablet by mouth once daily   aspirin EC 81 MG tablet Take 1 tablet (81 mg total) by mouth daily.   atorvastatin (LIPITOR) 80 MG tablet Take 1 tablet (80 mg total) by mouth daily at 6 PM.   Blood Glucose Monitoring Suppl (ACCU-CHEK AVIVA PLUS) w/Device KIT 1 Device by Does not apply route daily. Use to monitor glucose levels once per day; E11.9   carvedilol (COREG) 3.125 MG tablet Take 1 tablet (3.125 mg total) by mouth 2 (two) times daily.   Cholecalciferol (VITAMIN D) 125 MCG (5000 UT) CAPS Take 1 capsule by mouth daily.   denosumab (PROLIA) 60 MG/ML SOLN injection Inject 60 mg into the skin every 6 (six) months. Administer in  upper arm, thigh, or abdomen   Fluticasone-Umeclidin-Vilant (TRELEGY ELLIPTA) 100-62.5-25 MCG/INH AEPB INHALE 1 DOSE INTO THE LUNGS DAILY.   glucose blood (ACCU-CHEK AVIVA PLUS) test strip 1 each by Other route daily. E11.9   OXYGEN Inhale 2-3 L into the lungs continuous. When exerting self    potassium chloride SA (KLOR-CON) 20 MEQ tablet Take 1 tablet (20  mEq total) by mouth 2 (two) times daily.   roflumilast (DALIRESP) 500 MCG TABS tablet Take 1 tablet (500 mcg total) by mouth daily.   SitaGLIPtin-MetFORMIN HCl (JANUMET XR) 50-1000 MG TB24 Take 1 tablet by mouth daily.   spironolactone (ALDACTONE) 25 MG tablet Take 1 tablet (25 mg total) by mouth daily.   thiamine (VITAMIN B-1) 100 MG tablet Take 1 tablet (100 mg total) by mouth every other day.   [DISCONTINUED] amLODipine (NORVASC) 10 MG tablet Take 1 tablet (10 mg total) by mouth daily.   No facility-administered encounter medications on file as of 02/20/2020.    Allergies (verified) Benicar [olmesartan], [name] [valsartan], Hydrocodone-acetaminophen, Lisinopril, Monosodium glutamate, Codeine, Lead acetate, and Nickel   History: Past Medical History:  Diagnosis Date   Abdominal aneurysm ([place])    Dr. Oneida Alar follows lLOV 2 ''17 per pt "around 2 cm"   Anemia    as a child   Arthritis    left ankle, right knee, right SI joint, wrists, lower back   Breast cancer in female [facility])    Left   COPD (chronic obstructive pulmonary disease) ([place])    ephysema-Dr. 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 Harlow Asa in [place] [facility]   Sleep apnea    cpap used sometimes, uses oxygen concentrator 2 l/m nasally bedtime   Varicella as child   Past Surgical History:  Procedure Laterality Date   APPENDECTOMY     2008   BREAST LUMPECTOMY Left 08/31/2017   BREAST LUMPECTOMY WITH RADIOACTIVE SEED LOCALIZATION Left 08/10/2017   Procedure: BREAST LUMPECTOMY WITH RADIOACTIVE SEED  LOCALIZATION;  Surgeon: Rolm Bookbinder, MD;  Location: [place];  Service: General;  Laterality: Left;   [place]   COLONOSCOPY     COLONOSCOPY WITH PROPOFOL N/A 04/16/2016   Procedure: COLONOSCOPY WITH PROPOFOL;  Surgeon: Carol Ada, MD;  Location: [facility];  Service: Endoscopy;  Laterality: N/A;   CYST REMOVAL NECK Left 04/13/2016   Procedure: EXCISION OF SEBACEOUS CYST LEFT POSTERIOR NECK;  Surgeon: Armandina Gemma, MD;  Location: [place];  Service: General;  Laterality: Left;   Excision of Pelvic Absess, Right Ovary     2008   RE-EXCISION OF BREAST CANCER,SUPERIOR MARGINS Left 08/31/2017   Procedure: RE-EXCISION OF LEFT  BREAST MARGINS ERAS PATHWAY;  Surgeon: Rolm Bookbinder, MD;  Location: [place];  Service: General;  Laterality: Left;   TUBAL LIGATION  1980   Family History  Problem Relation Age of Onset   Heart disease Mother        MI - fatal   Hypertension Mother  Stroke Father 13       fatal   Alzheimer's disease Father    Alzheimer's disease Brother    Hyperlipidemia Brother    Hypertension Brother    Diabetes Brother    Hypertension Brother    Hyperlipidemia Brother    Breast cancer Paternal Grandmother    Social History   Socioeconomic History   Marital status: Divorced    Spouse name: Not on file   Number of children: 1   Years of education: 16   Highest education level: Not on file  Occupational History   Occupation: Social worker, Technical sales engineer: [employer]  Tobacco Use   Smoking status: Former Smoker    Packs/day: 1.00    Years: 50.00    Pack years: 50.00    Types: Cigarettes    Quit date: 04/16/2011    Years since quitting: 8.8   Smokeless tobacco: Never Used   Tobacco comment: quit that date when she had to go to ER   Vaping Use   Vaping Use: Never used  Substance and Sexual Activity   Alcohol use: Yes    Alcohol/week: 0.0 standard drinks    Comment: rare occasion   Drug use: No     Comment: no marijuana since 2012   Sexual activity: Not Currently  Other Topics Concern   Not on file  Social History Narrative   HSG; [name], [place]. . Married '69 - 9 yrs/divorced. 1 son - '72; no grandchildren.   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 No h/o physical or sexual abuse. ACP - no living will - wants information. Provided packet of information. On 08/05/2011: she stated she was distant cousins to celebrities [name] and [name]      Social Determinants of Health   Financial Resource Strain: Low Risk    Difficulty of Paying Living Expenses: Not hard at all  Food Insecurity: No Food Insecurity   Worried About Charity fundraiser in the Last Year: Never true   Arboriculturist in the Last Year: Never true  Transportation Needs: No Transportation Needs   Lack of Transportation (Medical): No   Lack of Transportation (Non-Medical): No  Physical Activity: Sufficiently Active   Days of Exercise per Week: 5 days   Minutes of Exercise per Session: 30 min  Stress: No Stress Concern Present   Feeling of Stress : Not at all  Social Connections: Socially Integrated   Frequency of Communication with Friends and Family: More than three times a week   Frequency of Social Gatherings with Friends and Family: More than three times a week   Attends Religious Services: More than 4 times per year   Active Member of [employer] or Organizations: Yes   Attends Music therapist: More than 4 times per year   Marital Status: Married    Tobacco Counseling Counseling given: Not Answered Comment: quit that date when she had to go to ER    Clinical Intake:  Pre-visit preparation completed: Yes  Pain : No/denies pain     Nutritional Risks: None Diabetes: No  How often do you need to have someone help you when you read instructions, pamphlets, or other written materials from your doctor or  pharmacy?: 1 - Never What is the last grade level you completed in school?: Bachelor's Degree  Diabetic? yes  Interpreter Needed?: No  Information entered by :: Lisette Abu, LPN   Activities of Daily Living In  your present state of health, do you have any difficulty performing the following activities: 02/20/2020  Hearing? N  Vision? N  Difficulty concentrating or making decisions? N  Walking or climbing stairs? N  Dressing or bathing? N  Doing errands, shopping? N  Preparing Food and eating ? N  Using the Toilet? N  In the past six months, have you accidently leaked urine? N  Do you have problems with loss of bowel control? N  Managing your Medications? N  Managing your Finances? N  Housekeeping or managing your Housekeeping? N  Some recent data might be hidden    Patient Care Team: Janith Lima, MD as PCP - General (Internal Medicine) Troy Sine, MD as PCP - Cardiology (Cardiology) Brand Males, MD as Consulting Physician (Pulmonary Disease) Arvella Nigh, MD as Consulting Physician (Obstetrics and Gynecology) Magrinat, Virgie Dad, MD as Consulting Physician (Oncology) Rolm Bookbinder, MD as Consulting Physician (General Surgery) Gery Pray, MD as Consulting Physician (Radiation Oncology) Troy Sine, MD as Consulting Physician (Cardiology) Delice Bison, Charlestine Massed, NP as Nurse Practitioner (Hematology and Oncology) Parrett, Fonnie Mu, NP as Nurse Practitioner (Pulmonary Disease) Renato Shin, MD as Consulting Physician (Endocrinology) Charlton Haws, [facility] as Pharmacist (Pharmacist)  Indicate any recent Medical Services you may have received from other than [facility] providers in the past year (date may be approximate).     Assessment:   This is a routine wellness examination for Victoria [name].  Hearing/Vision screen No exam data present  Dietary issues and exercise activities discussed: Current Exercise Habits: Home exercise routine, Type of  exercise: walking, Time (Minutes): 30, Frequency (Times/Week): 5, Weekly Exercise (Minutes/Week): 150, Intensity: Moderate, Exercise limited by: cardiac condition(s);respiratory conditions(s)  Goals     Exercise 3x per week (30 min per time)     Will try to get up to go with dtr biw; at 6:45 to participate in swim class Pulmonary rehab is also monitoring exercise and tolerance; so the patient will know her limits Also discussed; weight bearing in the pool; assist osteo prevention      Patient Stated     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 Continue to go to church and worship God. Eat health, exercise enjoy life and family     Patient Stated     I want to lose weight and get diabetes under control by monitoring my diet and exercise by dancing around the house.     Patient Stated     Continue to lose weight and control her glucose levels.      Depression Screen PHQ 2/9 Scores 02/20/2020 04/19/2019 06/12/2018 06/07/2017 02/23/2016 04/07/2015 01/28/2015  PHQ - 2 Score 1 0 0 0 1 2 0  PHQ- 9 Score - - - 0 - 5 -    Fall Risk Fall Risk  02/20/2020 04/19/2019 06/12/2018 06/07/2017 02/23/2016  Falls in the past year? 0 0 0 No No  Number falls in past yr: 0 0 0 - -  Injury with Fall? 0 0 - - -  Risk for fall due to : No Fall Risks - - - -  Follow up Falls evaluation completed Falls evaluation completed - - -    Any stairs in or around the home? No  If so, are there any without handrails? No  Home free of loose throw rugs in walkways, pet beds, electrical cords, etc? Yes  Adequate lighting in your home to reduce risk of falls? Yes   ASSISTIVE DEVICES  UTILIZED TO PREVENT FALLS:  Life alert? No  Use of a cane, walker or w/c? No  Grab bars in the bathroom? Yes  Shower chair or bench in shower? Yes  Elevated toilet seat or a handicapped toilet? Yes   TIMED UP AND GO:  Was the test performed? No .  Length of time to ambulate 10 feet: 0 sec.   Gait steady and  fast without use of assistive device  Cognitive Function: MMSE - Mini Mental State Exam 06/07/2017 10/29/2014  Not completed: - Unable to complete  Orientation to time 5 -  Orientation to Place 5 -  Registration 3 -  Attention/ Calculation 5 -  Recall 2 -  Language- name 2 objects 2 -  Language- repeat 1 -  Language- follow 3 step command 3 -  Language- read & follow direction 1 -  Write a sentence 1 -  Copy design 1 -  Total score 29 -        Immunizations Immunization History  Administered Date(s) Administered   Fluad Quad(high Dose 65+) 01/17/2019   Influenza Split 01/16/2011   Influenza, High Dose Seasonal PF 01/13/2017, 02/09/2018   Influenza,inj,Quad PF,6+ Mos 02/01/2013, 02/27/2014, 04/07/2015, 01/08/2016   PFIZER SARS-COV-2 Vaccination 06/06/2019, 06/27/2019   Pneumococcal Conjugate-13 02/01/2013   Pneumococcal Polysaccharide-23 10/29/2014   Td 12/06/2011    TDAP status: Up to date Flu Vaccine status: Up to date Pneumococcal vaccine status: Up to date Covid-19 vaccine status: Completed vaccines  Qualifies for Shingles Vaccine? Yes   Zostavax completed Yes   Shingrix Completed?: Yes  Screening Tests Health Maintenance  Topic Date Due   INFLUENZA VACCINE  12/16/2019   URINE MICROALBUMIN  04/18/2020   OPHTHALMOLOGY EXAM  03/27/2020   HEMOGLOBIN A1C  06/08/2020   FOOT EXAM  12/06/2020   TETANUS/TDAP  12/05/2021   DEXA SCAN  Completed   COVID-19 Vaccine  Completed   Hepatitis C Screening  Completed   PNA vac Low Risk Adult  Completed    Health Maintenance  Health Maintenance Due  Topic Date Due   INFLUENZA VACCINE  12/16/2019   URINE MICROALBUMIN  04/18/2020    Colorectal cancer screening: Completed 04/16/2016. Repeat every 5 years Mammogram status: Completed 7//21/2021. Repeat every year Bone Density status: Completed 04/10/2019. Results reflect: Bone density results: OSTEOPENIA. Repeat every 2 years.  Lung Cancer Screening:  (Low Dose CT Chest recommended if Age 81-80 years, 30 pack-year currently smoking OR have quit w/in 15years.) does qualify.   Lung Cancer Screening Referral: No; patient has standing orders for yearly lung cancer screening with Pulmonology.  Additional Screening:  Hepatitis C Screening: does qualify; Completed yes  Vision Screening: Recommended annual ophthalmology exams for early detection of glaucoma and other disorders of the eye. Is the patient up to date with their annual eye exam?  Yes  Who is the provider or what is the name of the office in which the patient attends annual eye exams? [facility] If pt is not established with a provider, would they like to be referred to a provider to establish care? No .   Dental Screening: Recommended annual dental exams for proper oral hygiene  Community Resource Referral / Chronic Care Management: CRR required this visit?  No   CCM required this visit?  No      Plan:     I have personally reviewed and noted the following in the patients chart:    Medical and social history  Use of alcohol, tobacco or  illicit drugs   Current medications and supplements  Functional ability and status  Nutritional status  Physical activity  Advanced directives  List of other physicians  Hospitalizations, surgeries, and ER visits in previous 12 months  Vitals  Screenings to include cognitive, depression, and falls  Referrals and appointments  In addition, I have reviewed and discussed with patient certain preventive protocols, quality metrics, and best practice recommendations. A written personalized care plan for preventive services as well as general preventive health recommendations were provided to patient.     Sheral Flow, LPN   [id number]   Nurse Notes:  Patient is cogitatively intact. There were no vitals filed for this visit. There is no height or weight on file to calculate BMI. Patient stated that she has  no issues with gait or balance; does not use any assistive devices.

## 2020-02-20 NOTE — Patient Instructions (Signed)
Victoria [name] , Thank you for taking time to come for your Medicare Wellness Visit. I appreciate your ongoing commitment to your health goals. Please review the following plan we discussed and let me know if I can assist you in the future.   Screening recommendations/referrals: Colonoscopy: 04/16/2016; due every 5 years  Mammogram: 12/05/2019 Bone Density: 04/10/2019 Recommended yearly ophthalmology/optometry visit for glaucoma screening and checkup Recommended yearly dental visit for hygiene and checkup  Vaccinations: Influenza vaccine: 01/17/2019 Pneumococcal vaccine: up to date Tdap vaccine: 12/06/2011; due every 10 years Shingles vaccine: up to date (per patient; need documentation)   Covid-19: completed  Advanced directives: Please bring a copy of your health care power of attorney and living will to the office at your convenience.  Conditions/risks identified: Yes; Reviewed health maintenance screenings with patient today and relevant education, vaccines, and/or referrals were provided. Please continue to do your personal lifestyle choices by: daily care of teeth and gums, regular physical activity (goal should be 5 days a week for 30 minutes), eat a healthy diet, avoid tobacco and drug use, limiting any alcohol intake, taking a low-dose aspirin (if not allergic or have been advised by your provider otherwise) and taking vitamins and minerals as recommended by your provider. Continue doing brain stimulating activities (puzzles, reading, adult coloring books, staying active) to keep memory sharp. Continue to eat heart healthy diet (full of fruits, vegetables, whole grains, lean protein, water--limit salt, fat, and sugar intake) and increase physical activity as tolerated.  Next appointment: Please schedule your next Medicare Wellness Visit with your Nurse Health Advisor in 1 year by calling [phone].   Preventive Care 7 Years and Older, Female Preventive care refers to lifestyle choices and  visits with your health care provider that can promote health and wellness. What does preventive care include?  A yearly physical exam. This is also called an annual well check.  Dental exams once or twice a year.  Routine eye exams. Ask your health care provider how often you should have your eyes checked.  Personal lifestyle choices, including:  Daily care of your teeth and gums.  Regular physical activity.  Eating a healthy diet.  Avoiding tobacco and drug use.  Limiting alcohol use.  Practicing safe sex.  Taking low-dose aspirin every day.  Taking vitamin and mineral supplements as recommended by your health care provider. What happens during an annual well check? The services and screenings done by your health care provider during your annual well check will depend on your age, overall health, lifestyle risk factors, and family history of disease. Counseling  Your health care provider may ask you questions about your:  Alcohol use.  Tobacco use.  Drug use.  Emotional well-being.  Home and relationship well-being.  Sexual activity.  Eating habits.  History of falls.  Memory and ability to understand (cognition).  Work and work Statistician.  Reproductive health. Screening  You may have the following tests or measurements:  Height, weight, and BMI.  Blood pressure.  Lipid and cholesterol levels. These may be checked every 5 years, or more frequently if you are over 4 years old.  Skin check.  Lung cancer screening. You may have this screening every year starting at age 44 if you have a 30-pack-year history of smoking and currently smoke or have quit within the past 15 years.  Fecal occult blood test (FOBT) of the stool. You may have this test every year starting at age 76.  Flexible sigmoidoscopy or colonoscopy. You may have  a sigmoidoscopy every 5 years or a colonoscopy every 10 years starting at age 33.  Hepatitis C blood test.  Hepatitis B  blood test.  Sexually transmitted disease (STD) testing.  Diabetes screening. This is done by checking your blood sugar (glucose) after you have not eaten for a while (fasting). You may have this done every 1-3 years.  Bone density scan. This is done to screen for osteoporosis. You may have this done starting at age 56.  Mammogram. This may be done every 1-2 years. Talk to your health care provider about how often you should have regular mammograms. Talk with your health care provider about your test results, treatment options, and if necessary, the need for more tests. Vaccines  Your health care provider may recommend certain vaccines, such as:  Influenza vaccine. This is recommended every year.  Tetanus, diphtheria, and acellular pertussis (Tdap, Td) vaccine. You may need a Td booster every 10 years.  Zoster vaccine. You may need this after age 51.  Pneumococcal 13-valent conjugate (PCV13) vaccine. One dose is recommended after age 40.  Pneumococcal polysaccharide (PPSV23) vaccine. One dose is recommended after age 64. Talk to your health care provider about which screenings and vaccines you need and how often you need them. This information is not intended to replace advice given to you by your health care provider. Make sure you discuss any questions you have with your health care provider. Document Released: 05/30/2015 Document Revised: 01/21/2016 Document Reviewed: 03/04/2015 Elsevier Interactive Patient Education  2017 [place] Prevention in the Home Falls can cause injuries. They can happen to people of all ages. There are many things you can do to make your home safe and to help prevent falls. What can I do on the outside of my home?  Regularly fix the edges of walkways and driveways and fix any cracks.  Remove anything that might make you trip as you walk through a door, such as a raised step or threshold.  Trim any bushes or trees on the path to your  home.  Use bright outdoor lighting.  Clear any walking paths of anything that might make someone trip, such as rocks or tools.  Regularly check to see if handrails are loose or broken. Make sure that both sides of any steps have handrails.  Any raised decks and porches should have guardrails on the edges.  Have any leaves, snow, or ice cleared regularly.  Use sand or salt on walking paths during winter.  Clean up any spills in your garage right away. This includes oil or grease spills. What can I do in the bathroom?  Use night lights.  Install grab bars by the toilet and in the tub and shower. Do not use towel bars as grab bars.  Use non-skid mats or decals in the tub or shower.  If you need to sit down in the shower, use a plastic, non-slip stool.  Keep the floor dry. Clean up any water that spills on the floor as soon as it happens.  Remove soap buildup in the tub or shower regularly.  Attach bath mats securely with double-sided non-slip rug tape.  Do not have throw rugs and other things on the floor that can make you trip. What can I do in the bedroom?  Use night lights.  Make sure that you have a light by your bed that is easy to reach.  Do not use any sheets or blankets that are too big for your bed.  They should not hang down onto the floor.  Have a firm chair that has side arms. You can use this for support while you get dressed.  Do not have throw rugs and other things on the floor that can make you trip. What can I do in the kitchen?  Clean up any spills right away.  Avoid walking on wet floors.  Keep items that you use a lot in easy-to-reach places.  If you need to reach something above you, use a strong step stool that has a grab bar.  Keep electrical cords out of the way.  Do not use floor polish or wax that makes floors slippery. If you must use wax, use non-skid floor wax.  Do not have throw rugs and other things on the floor that can make you  trip. What can I do with my stairs?  Do not leave any items on the stairs.  Make sure that there are handrails on both sides of the stairs and use them. Fix handrails that are broken or loose. Make sure that handrails are as long as the stairways.  Check any carpeting to make sure that it is firmly attached to the stairs. Fix any carpet that is loose or worn.  Avoid having throw rugs at the top or bottom of the stairs. If you do have throw rugs, attach them to the floor with carpet tape.  Make sure that you have a light switch at the top of the stairs and the bottom of the stairs. If you do not have them, ask someone to add them for you. What else can I do to help prevent falls?  Wear shoes that:  Do not have high heels.  Have rubber bottoms.  Are comfortable and fit you well.  Are closed at the toe. Do not wear sandals.  If you use a stepladder:  Make sure that it is fully opened. Do not climb a closed stepladder.  Make sure that both sides of the stepladder are locked into place.  Ask someone to hold it for you, if possible.  Clearly mark and make sure that you can see:  Any grab bars or handrails.  First and last steps.  Where the edge of each step is.  Use tools that help you move around (mobility aids) if they are needed. These include:  Canes.  Walkers.  Scooters.  Crutches.  Turn on the lights when you go into a dark area. Replace any light bulbs as soon as they burn out.  Set up your furniture so you have a clear path. Avoid moving your furniture around.  If any of your floors are uneven, fix them.  If there are any pets around you, be aware of where they are.  Review your medicines with your doctor. Some medicines can make you feel dizzy. This can increase your chance of falling. Ask your doctor what other things that you can do to help prevent falls. This information is not intended to replace advice given to you by your health care provider. Make  sure you discuss any questions you have with your health care provider. Document Released: 02/27/2009 Document Revised: 10/09/2015 Document Reviewed: 06/07/2014 Elsevier Interactive Patient Education  2017 [employer].

## 2020-02-27 ENCOUNTER — Other Ambulatory Visit: Payer: Self-pay | Admitting: Endocrinology

## 2020-02-27 DIAGNOSIS — E118 Type 2 diabetes mellitus with unspecified complications: Secondary | ICD-10-CM

## 2020-02-29 ENCOUNTER — Other Ambulatory Visit: Payer: Self-pay | Admitting: Endocrinology

## 2020-02-29 MED ORDER — GLUCOSE BLOOD VI STRP: ORAL_STRIP | 12 refills | Status: DC

## 2020-02-29 NOTE — Telephone Encounter (Signed)
Patient needs refill on test strips but does not know which Accu Chek she uses, will get that information and call us back for a refill request

## 2020-02-29 NOTE — Telephone Encounter (Signed)
Aviva test strips sent into the pharmacy

## 2020-02-29 NOTE — Telephone Encounter (Signed)
Medication Refill Request  Did you call your pharmacy and request this refill first? Yes  . If patient has not contacted pharmacy first, instruct them to do so for future refills.  . Remind them that contacting the pharmacy for their refill is the quickest method to get the refill.  . Refill policy also stated that it will take anywhere between 24-72 hours to receive the refill.    Name of medication? Accu Check Aviva Plus Test Strips  Is this a 90 day supply? unknown  Name and location of pharmacy? [facility] on [address]   . Is the request for diabetes test strips? YES . If yes, what brand? Accu Check Aviva Plus Test Strips

## 2020-03-10 DIAGNOSIS — G4733 Obstructive sleep apnea (adult) (pediatric): Secondary | ICD-10-CM | POA: Diagnosis not present

## 2020-03-12 ENCOUNTER — Telehealth: Payer: Self-pay | Admitting: Internal Medicine

## 2020-03-12 NOTE — Telephone Encounter (Signed)
We do NOT have that copd trial. But if she knows the place they are doing the trial and she wants to participate that will be fine.  She should also get the Covid booster vaccine and I agree with your recommendation on that

## 2020-03-12 NOTE — Telephone Encounter (Signed)
Spoke with pt. She is inquiring about doing a COPD research study >> Air Flow 3 trial. Pt also inquired about the COVID booster vaccine. Advised her that we are encouraging our pt's to get the booster if they had the first 2 vaccines.  MR - please advise about the research trial. Thanks.

## 2020-03-13 ENCOUNTER — Ambulatory Visit: Payer: Medicare Other

## 2020-03-13 DIAGNOSIS — J449 Chronic obstructive pulmonary disease, unspecified: Secondary | ICD-10-CM | POA: Diagnosis not present

## 2020-03-13 NOTE — Telephone Encounter (Signed)
Spoke with pt. She is aware of MR's response. While on the phone pt wanted to schedule her 4 month ROV. This has been scheduled with TP on 04/30/20 at 1130. Nothing further was needed.

## 2020-03-20 ENCOUNTER — Other Ambulatory Visit: Payer: Self-pay | Admitting: Internal Medicine

## 2020-03-20 DIAGNOSIS — I2510 Atherosclerotic heart disease of native coronary artery without angina pectoris: Secondary | ICD-10-CM

## 2020-03-20 DIAGNOSIS — E785 Hyperlipidemia, unspecified: Secondary | ICD-10-CM

## 2020-03-20 MED ORDER — ATORVASTATIN CALCIUM 80 MG PO TABS: 80.0000 mg | ORAL_TABLET | Freq: Every day | ORAL | 0 refills | Status: DC

## 2020-03-26 ENCOUNTER — Inpatient Hospital Stay: Payer: Medicare Other | Admitting: Adult Health

## 2020-03-31 ENCOUNTER — Other Ambulatory Visit: Payer: Self-pay

## 2020-03-31 ENCOUNTER — Telehealth: Payer: Self-pay | Admitting: Internal Medicine

## 2020-03-31 ENCOUNTER — Inpatient Hospital Stay: Payer: Medicare Other | Attending: Adult Health | Admitting: Adult Health

## 2020-03-31 ENCOUNTER — Encounter: Payer: Self-pay | Admitting: Adult Health

## 2020-03-31 VITALS — BP 160/63 | HR 83 | Temp 98.4°F | Resp 17 | Ht 64.5 in | Wt 211.9 lb

## 2020-03-31 DIAGNOSIS — I10 Essential (primary) hypertension: Secondary | ICD-10-CM | POA: Diagnosis not present

## 2020-03-31 DIAGNOSIS — Z79899 Other long term (current) drug therapy: Secondary | ICD-10-CM | POA: Diagnosis not present

## 2020-03-31 DIAGNOSIS — Z170 Estrogen receptor positive status [ER+]: Secondary | ICD-10-CM

## 2020-03-31 DIAGNOSIS — Z87891 Personal history of nicotine dependence: Secondary | ICD-10-CM | POA: Insufficient documentation

## 2020-03-31 DIAGNOSIS — Z79811 Long term (current) use of aromatase inhibitors: Secondary | ICD-10-CM | POA: Insufficient documentation

## 2020-03-31 DIAGNOSIS — C50412 Malignant neoplasm of upper-outer quadrant of left female breast: Secondary | ICD-10-CM | POA: Diagnosis not present

## 2020-03-31 MED ORDER — PROAIR RESPICLICK 108 (90 BASE) MCG/ACT IN AEPB
1.0000 | INHALATION_SPRAY | Freq: Four times a day (QID) | RESPIRATORY_TRACT | 5 refills | Status: DC | PRN
Start: 2020-03-31 — End: 2024-03-12

## 2020-03-31 NOTE — Patient Instructions (Signed)
Bone Health Bones protect organs, store calcium, anchor muscles, and support the whole body. Keeping your bones strong is important, especially as you get older. You can take actions to help keep your bones strong and healthy. Why is keeping my bones healthy important?  Keeping your bones healthy is important because your body constantly replaces bone cells. Cells get old, and new cells take their place. As we age, we lose bone cells because the body may not be able to make enough new cells to replace the old cells. The amount of bone cells and bone tissue you have is referred to as bone mass. The higher your bone mass, the stronger your bones. The aging process leads to an overall loss of bone mass in the body, which can increase the likelihood of:  Joint pain and stiffness.  Broken bones.  A condition in which the bones become weak and brittle (osteoporosis). A large decline in bone mass occurs in older adults. In women, it occurs about the time of menopause. What actions can I take to keep my bones healthy? Good health habits are important for maintaining healthy bones. This includes eating nutritious foods and exercising regularly. To have healthy bones, you need to get enough of the right minerals and vitamins. Most nutrition experts recommend getting these nutrients from the foods that you eat. In some cases, taking supplements may also be recommended. Doing certain types of exercise is also important for bone health. What are the nutritional recommendations for healthy bones?  Eating a well-balanced diet with plenty of calcium and vitamin D will help to protect your bones. Nutritional recommendations vary from person to person. Ask your health care provider what is healthy for you. Here are some general guidelines. Get enough calcium Calcium is the most important (essential) mineral for bone health. Most people can get enough calcium from their diet, but supplements may be recommended for  people who are at risk for osteoporosis. Good sources of calcium include:  Dairy products, such as low-fat or nonfat milk, cheese, and yogurt.  Dark green leafy vegetables, such as bok choy and broccoli.  Calcium-fortified foods, such as orange juice, cereal, bread, soy beverages, and tofu products.  Nuts, such as almonds. Follow these recommended amounts for daily calcium intake:  Children, age 1-3: 700 mg.  Children, age 4-8: 1,000 mg.  Children, age 9-13: 1,300 mg.  Teens, age 14-18: 1,300 mg.  Adults, age 19-50: 1,000 mg.  Adults, age 51-70: ? Men: 1,000 mg. ? Women: 1,200 mg.  Adults, age 71 or older: 1,200 mg.  Pregnant and breastfeeding females: ? Teens: 1,300 mg. ? Adults: 1,000 mg. Get enough vitamin D Vitamin D is the most essential vitamin for bone health. It helps the body absorb calcium. Sunlight stimulates the skin to make vitamin D, so be sure to get enough sunlight. If you live in a cold climate or you do not get outside often, your health care provider may recommend that you take vitamin D supplements. Good sources of vitamin D in your diet include:  Egg yolks.  Saltwater fish.  Milk and cereal fortified with vitamin D. Follow these recommended amounts for daily vitamin D intake:  Children and teens, age 1-18: 600 international units.  Adults, age 50 or younger: 400-800 international units.  Adults, age 51 or older: 800-1,000 international units. Get other important nutrients Other nutrients that are important for bone health include:  Phosphorus. This mineral is found in meat, poultry, dairy foods, nuts, and legumes. The   recommended daily intake for adult men and adult women is 700 mg.  Magnesium. This mineral is found in seeds, nuts, dark green vegetables, and legumes. The recommended daily intake for adult men is 400-420 mg. For adult women, it is 310-320 mg.  Vitamin K. This vitamin is found in green leafy vegetables. The recommended daily  intake is 120 mg for adult men and 90 mg for adult women. What type of physical activity is best for building and maintaining healthy bones? Weight-bearing and strength-building activities are important for building and maintaining healthy bones. Weight-bearing activities cause muscles and bones to work against gravity. Strength-building activities increase the strength of the muscles that support bones. Weight-bearing and muscle-building activities include:  Walking and hiking.  Jogging and running.  Dancing.  Gym exercises.  Lifting weights.  Tennis and racquetball.  Climbing stairs.  Aerobics. Adults should get at least 30 minutes of moderate physical activity on most days. Children should get at least 60 minutes of moderate physical activity on most days. Ask your health care provider what type of exercise is best for you. How can I find out if my bone mass is low? Bone mass can be measured with an X-ray test called a bone mineral density (BMD) test. This test is recommended for all women who are age 65 or older. It may also be recommended for:  Men who are age 70 or older.  People who are at risk for osteoporosis because of: ? Having bones that break easily. ? Having a long-term disease that weakens bones, such as kidney disease or rheumatoid arthritis. ? Having menopause earlier than normal. ? Taking medicine that weakens bones, such as steroids, thyroid hormones, or hormone treatment for breast cancer or prostate cancer. ? Smoking. ? Drinking three or more alcoholic drinks a day. If you find that you have a low bone mass, you may be able to prevent osteoporosis or further bone loss by changing your diet and lifestyle. Where can I find more information? For more information, check out the following websites:  National Osteoporosis Foundation: www.nof.org/patients  National Institutes of Health: www.bones.nih.gov  International Osteoporosis Foundation:  www.iofbonehealth.org Summary  The aging process leads to an overall loss of bone mass in the body, which can increase the likelihood of broken bones and osteoporosis.  Eating a well-balanced diet with plenty of calcium and vitamin D will help to protect your bones.  Weight-bearing and strength-building activities are also important for building and maintaining strong bones.  Bone mass can be measured with an X-ray test called a bone mineral density (BMD) test. This information is not intended to replace advice given to you by your health care provider. Make sure you discuss any questions you have with your health care provider. Document Revised: 05/30/2017 Document Reviewed: 05/30/2017 Elsevier Patient Education  2020 [employer]  

## 2020-03-31 NOTE — Progress Notes (Signed)
CLINIC:  Survivorship   REASON FOR VISIT:  Routine follow-up for history of breast cancer.   BRIEF ONCOLOGIC HISTORY:  Oncology History  Malignant neoplasm of upper-outer quadrant of left breast in female, estrogen receptor positive ([place])  06/24/2017 Initial Biopsy   status post left breast upper outer quadrant biopsy 06/24/2017 for a clinical T1b N0, stage IA invasive ductal carcinoma, grade 1, estrogen and progesterone receptor positive, HER-2 not amplified, with an MIB-5%   07/01/2017 Initial Diagnosis   Malignant neoplasm of upper-outer quadrant of left breast in female, estrogen receptor positive ([place])   08/10/2017 Surgery   Left lumpectomy Victoria [name]) [phone]): IDC, grade 2, 1.2cm, DCIS, anterior margin positive, 0SLN biopsied, T1c, Nx   08/31/2017 Surgery   Margins cleared with re excision   09/2017 - 09/2022 Anti-estrogen oral therapy   Anastrozole      INTERVAL HISTORY:  Victoria [name] presents to the [facility] today for routine follow-up for her history of breast cancer.  Overall, she reports feeling quite well.   She continues on Anastrozole with good tolerance.  She has mild hot flashes, but otherwise is tolerating her medication well.    Victoria [name] has osteopenia and receives Prolia every 6 months.  She has previously received this with [place], and tells me today that she has started to receive this with Dr. Adah Salvage office.    Her most recent mammogram was completed on 12/05/2019 and demonstrated no evidence of malignancy.        REVIEW OF SYSTEMS:  Review of Systems  Constitutional: Negative for appetite change, chills, fatigue, fever and unexpected weight change.  HENT:   Negative for hearing loss, lump/mass and trouble swallowing.   Eyes: Negative for eye problems and icterus.  Respiratory: Negative for chest tightness, cough and shortness of breath.   Cardiovascular: Negative for chest pain, leg swelling and palpitations.  Gastrointestinal: Negative for  abdominal distention, abdominal pain, constipation, diarrhea, nausea and vomiting.  Endocrine: Negative for hot flashes.  Genitourinary: Negative for difficulty urinating.   Musculoskeletal: Negative for arthralgias.  Skin: Negative for itching and rash.  Neurological: Negative for dizziness, extremity weakness, headaches and numbness.  Hematological: Negative for adenopathy. Does not bruise/bleed easily.  Psychiatric/Behavioral: Negative for depression. The patient is not nervous/anxious.    Breast: Denies any new nodularity, masses, tenderness, nipple changes, or nipple discharge.       PAST MEDICAL/SURGICAL HISTORY:  Past Medical History:  Diagnosis Date  . Abdominal aneurysm ([place])    Dr. Oneida Alar follows lLOV 2 ''17 per pt "around 2 cm"  . Anemia    as a child  . Arthritis    left ankle, right knee, right SI joint, wrists, lower back  . Breast cancer in female [facility])    Left  . COPD (chronic obstructive pulmonary disease) ([place])    ephysema-Dr. Chase Caller  . Dyspnea   . Headache    as a child would have terrible headaches during season changes  . 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 Hyperlipidemia   . Hypertension   . Multiple thyroid nodules   . Murmur, cardiac 1950  . Osteoporosis   . Pneumonia   . Pre-diabetes   . Requires continuous at home supplemental oxygen    2 L 24/7  . Sebaceous cyst    hairline sebaceous cyst left posterior neck to be excised 04-13-16 by Dr. Harlow Asa in [facility]  . Sleep apnea    cpap used sometimes, uses oxygen concentrator  2 l/m nasally bedtime  . Varicella as child   Past Surgical History:  Procedure Laterality Date  . APPENDECTOMY     2008  . BREAST LUMPECTOMY Left 08/31/2017  . BREAST LUMPECTOMY WITH RADIOACTIVE SEED LOCALIZATION Left 08/10/2017   Procedure: BREAST LUMPECTOMY WITH RADIOACTIVE SEED LOCALIZATION;  Surgeon: Rolm Bookbinder, MD;  Location: [place];  Service: General;  Laterality: Left;  . [place]  . COLONOSCOPY    . COLONOSCOPY WITH PROPOFOL N/A 04/16/2016   Procedure: COLONOSCOPY WITH PROPOFOL;  Surgeon: Carol Ada, MD;  Location: [facility];  Service: Endoscopy;  Laterality: N/A;  . CYST REMOVAL NECK Left 04/13/2016   Procedure: EXCISION OF SEBACEOUS CYST LEFT POSTERIOR NECK;  Surgeon: Armandina Gemma, MD;  Location: [place];  Service: General;  Laterality: Left;  . Excision of Pelvic Absess, Right Ovary     2008  . RE-EXCISION OF BREAST CANCER,SUPERIOR MARGINS Left 08/31/2017   Procedure: RE-EXCISION OF LEFT  BREAST MARGINS ERAS PATHWAY;  Surgeon: Rolm Bookbinder, MD;  Location: [place];  Service: General;  Laterality: Left;  . TUBAL LIGATION  1980     ALLERGIES:  Allergies  Allergen Reactions  . Benicar [Olmesartan] Swelling    Swelling of face and arms   . [name] [Valsartan] Swelling    Swelling of face and arms   . Hydrocodone-Acetaminophen Nausea And Vomiting    Severe vomiting  . Lisinopril Cough  . Monosodium Glutamate Other (See Comments)    Facial swelling per pt  . Codeine Other (See Comments)    jittery  . Lead Acetate Rash  . Nickel Rash    Severe rash to infection: pt is allergic to all metals other than sterling silver or gold jewelry.      CURRENT MEDICATIONS:  Outpatient Encounter Medications as of 03/31/2020  Medication Sig  . ACCU-CHEK GUIDE test strip USE 1 STRIP ONCE DAILY  . Accu-Chek Softclix Lancets lancets USE 1  TO CHECK GLUCOSE ONCE DAILY  . acetaminophen (TYLENOL) 500 MG tablet Take 1,000 mg by mouth every 6 (six) hours as needed.  . Albuterol Sulfate (PROAIR RESPICLICK) 882 (90 Base) MCG/ACT AEPB Inhale 1-2 puffs into the lungs every 6 (six) hours as needed (for wheezing/shortness of breath).  [name] amLODipine (NORVASC) 10 MG tablet Take 1 tablet (10 mg total) by mouth daily.  [name] anastrozole (ARIMIDEX) 1 MG tablet Take 1 tablet by mouth once daily  . aspirin EC 81 MG tablet Take 1 tablet (81 mg total) by mouth daily.  [name] atorvastatin  (LIPITOR) 80 MG tablet Take 1 tablet (80 mg total) by mouth daily at 6 PM.  . Blood Glucose Monitoring Suppl (ACCU-CHEK AVIVA PLUS) w/Device KIT 1 Device by Does not apply route daily. Use to monitor glucose levels once per day; E11.9  . carvedilol (COREG) 3.125 MG tablet Take 1 tablet (3.125 mg total) by mouth 2 (two) times daily.  . Cholecalciferol (VITAMIN D) 125 MCG (5000 UT) CAPS Take 1 capsule by mouth daily.  [name] denosumab (PROLIA) 60 MG/ML SOLN injection Inject 60 mg into the skin every 6 (six) months. Administer in upper arm, thigh, or abdomen  . Fluticasone-Umeclidin-Vilant (TRELEGY ELLIPTA) 100-62.5-25 MCG/INH AEPB INHALE 1 DOSE INTO THE LUNGS DAILY.  [name] glucose blood test strip USE 1 STRIP ONCE DAILY  . OXYGEN Inhale 2-3 L into the lungs continuous. When exerting self   . potassium chloride SA (KLOR-CON) 20 MEQ tablet Take 1 tablet (20 mEq total) by mouth 2 (  two) times daily.  . roflumilast (DALIRESP) 500 MCG TABS tablet Take 1 tablet (500 mcg total) by mouth daily.  . SitaGLIPtin-MetFORMIN HCl (JANUMET XR) 50-1000 MG TB24 Take 1 tablet by mouth daily.  [name] spironolactone (ALDACTONE) 25 MG tablet Take 1 tablet (25 mg total) by mouth daily.  [name] thiamine (VITAMIN B-1) 100 MG tablet Take 1 tablet (100 mg total) by mouth every other day.  . [DISCONTINUED] amLODipine (NORVASC) 10 MG tablet Take 1 tablet (10 mg total) by mouth daily.   No facility-administered encounter medications on file as of 03/31/2020.     ONCOLOGIC FAMILY HISTORY:  Family History  Problem Relation Age of Onset  . Heart disease Mother        MI - fatal  . Hypertension Mother   . Stroke Father 21       fatal  . Alzheimer's disease Father   . Alzheimer's disease Brother   . Hyperlipidemia Brother   . Hypertension Brother   . Diabetes Brother   . Hypertension Brother   . Hyperlipidemia Brother   . Breast cancer Paternal Grandmother     GENETIC COUNSELING/TESTING: See above  SOCIAL HISTORY:  Social History    Socioeconomic History  . Marital status: Divorced    Spouse name: Not on file  . Number of children: 1  . Years of education: 50  . Highest education level: Not on file  Occupational History  . Occupation: Veterinary surgeon, Press photographer: [employer]  Tobacco Use  . Smoking status: Former Smoker    Packs/day: 1.00    Years: 50.00    Pack years: 50.00    Types: Cigarettes    Quit date: 04/16/2011    Years since quitting: 8.9  . Smokeless tobacco: Never Used  . Tobacco comment: quit that date when she had to go to ER   Vaping Use  . Vaping Use: Never used  Substance and Sexual Activity  . Alcohol use: Yes    Alcohol/week: 0.0 standard drinks    Comment: rare occasion  . Drug use: No    Comment: no marijuana since 2012  . Sexual activity: Not Currently  Other Topics Concern  . 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 . Married '69 - 9 yrs/divorced. 1 son - '72; no grandchildren.   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 No h/o physical or sexual abuse. ACP - no living will - wants information. Provided packet of information. On 08/05/2011: she stated she was distant cousins to celebrities Cleda Clarks and Felicie Morn      Social Determinants of Health   Financial Resource Strain: Low Risk   . Difficulty of Paying Living Expenses: Not hard at all  Food Insecurity: No Food Insecurity  . Worried About Programme researcher, broadcasting/film/video in the Last Year: Never true  . Ran Out of Food in the Last Year: Never true  Transportation Needs: No Transportation Needs  . Lack of Transportation (Medical): No  . Lack of Transportation (Non-Medical): No  Physical Activity: Sufficiently Active  . Days of Exercise per Week: 5 days  . Minutes of Exercise per Session: 30 min  Stress: No Stress Concern Present  . Feeling of Stress : Not at all  Social Connections: Socially Integrated  . Frequency of Communication  with Friends and Family: More than three times a week  . Frequency of Social Gatherings with Friends and Family: More than three times a  week  . Attends Religious Services: More than 4 times per year  . Active Member of Clubs or Organizations: Yes  . Attends Banker Meetings: More than 4 times per year  . Marital Status: Married  Catering manager Violence:   . Fear of Current or Ex-Partner: Not on file  . Emotionally Abused: Not on file  . Physically Abused: Not on file  . Sexually Abused: Not on file      PHYSICAL EXAMINATION:  Vital Signs: Vitals:   03/31/20 0954  BP: (!) 160/63  Pulse: 83  Resp: 17  Temp: 98.4 F (36.9 C)  SpO2: 96%   Filed Weights   03/31/20 0954  Weight: 211 lb 14.4 oz (96.1 kg)   General: Well-nourished, well-appearing female in no acute distress.  Unaccompanied today.   HEENT: Head is normocephalic.  Pupils equal and reactive to light. Conjunctivae clear without exudate.  Sclerae anicteric. Oral mucosa is pink, moist.  Oropharynx is pink without lesions or erythema.  Lymph: No cervical, supraclavicular, or infraclavicular lymphadenopathy noted on palpation.  Cardiovascular: Regular rate and rhythm.[name] Respiratory: Clear to auscultation bilaterally. Chest expansion symmetric; breathing non-labored.  Breast Exam:  -Left breast: No appreciable masses on palpation. No skin redness, thickening, or peau d'orange appearance; no nipple retraction or nipple discharge; mild distortion in symmetry at previous lumpectomy site well healed scar without erythema or nodularity.  -Right breast: No appreciable masses on palpation. No skin redness, thickening, or peau d'orange appearance; no nipple retraction or nipple discharge; -Axilla: No axillary adenopathy bilaterally.  GI: Abdomen soft and round; non-tender, non-distended. Bowel sounds normoactive. No hepatosplenomegaly.   GU: Deferred.  Neuro: No focal deficits. Steady gait.  Psych: Mood and affect  normal and appropriate for situation.  MSK: No focal spinal tenderness to palpation, full range of motion in bilateral upper extremities Extremities: No edema. Skin: Warm and dry.  LABORATORY DATA:  None for this visit   DIAGNOSTIC IMAGING:  Most recent mammogram:  Narrative & Impression  CLINICAL DATA:  Status post left lumpectomy in 2019 without chemotherapy or radiation therapy. She is currently taking anastrozole.  EXAM: DIGITAL DIAGNOSTIC BILATERAL MAMMOGRAM WITH TOMO AND CAD  COMPARISON:  Previous exam(s).  ACR Breast Density Category b: There are scattered areas of fibroglandular density.  FINDINGS: Stable post lumpectomy changes on the left. No interval findings suspicious for malignancy in either breast.  Mammographic images were processed with CAD.  IMPRESSION: No evidence of malignancy.  RECOMMENDATION: Bilateral diagnostic mammogram in 1 year.  I have discussed the findings and recommendations with the patient. If applicable, a reminder letter will be sent to the patient regarding the next appointment.  BI-RADS CATEGORY  2: Benign.   Electronically Signed   By: Beckie Salts M.D.   On: 12/05/2019 10:24       ASSESSMENT AND PLAN:  Victoria [name] is a pleasant 74 y.o. female with history of Stage IA left breast invasive ductal carcinoma, ER+/PR+/HER2-, diagnosed in 06/2017, treated with lumpectomy, and anti-estrogen therapy with Anastrozole beginning in 09/2017.  [facility] and routine follow-up.   1. History of breast cancer:  Victoria [name] is currently clinically and radiographically without evidence of disease or recurrence of breast cancer. She will be due for mammogram in 11/2020; orders placed today.  She will continue her anti-estrogen therapy with Anastrozole, with plans to continue for 5 years.  She will return to the cancer center to see her medical oncologist, Dr. Darnelle Catalan, in  10/2020.  I encouraged  her to call me with any questions or concerns before her next visit at the cancer center, and I would be happy to see her sooner, if needed.    2. Bone health:  Given Victoria [name] age, history of breast cancer, and her current anti-estrogen therapy with Anastrozole, she is at risk for bone demineralization. Her last DEXA scan was on 03/2019 and showed osteopenia.  She has a h/o receiving prolia as well.   She notes she is receiving this with her PCP and I recommended she f/u with them and get the date down that she is due.  She was given education on specific food and activities to promote bone health.  3. Cancer screening:  Due to Victoria [name]'s history and her age, she should receive screening for skin cancers, colon cancer, and gynecologic cancers. She was encouraged to follow-up with her PCP for appropriate cancer screenings.   4. Health maintenance and wellness promotion: Victoria [name] was encouraged to consume 5-7 servings of fruits and vegetables per day. She was also encouraged to engage in moderate to vigorous exercise for 30 minutes per day most days of the week. She was instructed to limit her alcohol consumption and continue to abstain from tobacco use.    Dispo:  -Return to cancer center in 6 months for labs and f/u with Dr. Jana Hakim -Mammogram in 11/2020   Total encounter time: 30 minutes*  Wilber Bihari, NP 03/31/20 10:39 AM Medical Oncology and Hematology [facility] [place], [place] [address] Tel. [phone]    Fax. [phone]  *Total Encounter Time as defined by the Centers for Medicare and Medicaid Services includes, in addition to the face-to-face time of a patient visit (documented in the note above) non-face-to-face time: obtaining and reviewing outside history, ordering and reviewing medications, tests or procedures, care coordination (communications with other health care professionals or caregivers) and documentation in the medical  record.   Note: PRIMARY CARE PROVIDER Janith Lima, [place] [phone]

## 2020-03-31 NOTE — Telephone Encounter (Signed)
Rx has been sent to pt's preferred pharmacy. Called and spoke with pt letting her know this had been done and she verbalized understanding.nothing further needed.

## 2020-04-01 ENCOUNTER — Telehealth: Payer: Self-pay | Admitting: Adult Health

## 2020-04-01 NOTE — Telephone Encounter (Signed)
Called pt to schedule appts per 11/15 los. Pt stated that she wanted to hold off on scheduling LTS visit because it was too far off. Pt had a question about a follow up appt with GM. Sent LC a msg to see if pt needs to be scheduled for 6 month follow up. Will call pt after receiving a response.

## 2020-04-02 ENCOUNTER — Other Ambulatory Visit: Payer: Self-pay | Admitting: Endocrinology

## 2020-04-02 DIAGNOSIS — E119 Type 2 diabetes mellitus without complications: Secondary | ICD-10-CM | POA: Diagnosis not present

## 2020-04-02 DIAGNOSIS — E118 Type 2 diabetes mellitus with unspecified complications: Secondary | ICD-10-CM

## 2020-04-02 DIAGNOSIS — H43813 Vitreous degeneration, bilateral: Secondary | ICD-10-CM | POA: Diagnosis not present

## 2020-04-02 DIAGNOSIS — H35033 Hypertensive retinopathy, bilateral: Secondary | ICD-10-CM | POA: Diagnosis not present

## 2020-04-02 DIAGNOSIS — H35372 Puckering of macula, left eye: Secondary | ICD-10-CM | POA: Diagnosis not present

## 2020-04-02 DIAGNOSIS — H2513 Age-related nuclear cataract, bilateral: Secondary | ICD-10-CM | POA: Diagnosis not present

## 2020-04-03 ENCOUNTER — Telehealth: Payer: Self-pay | Admitting: Adult Health

## 2020-04-03 ENCOUNTER — Other Ambulatory Visit: Payer: Self-pay | Admitting: Endocrinology

## 2020-04-03 DIAGNOSIS — E118 Type 2 diabetes mellitus with unspecified complications: Secondary | ICD-10-CM

## 2020-04-03 NOTE — Telephone Encounter (Signed)
Scheduled appts per 11/18 staff msg. Pt confirmed appt date and time.

## 2020-04-07 ENCOUNTER — Other Ambulatory Visit: Payer: Self-pay | Admitting: Endocrinology

## 2020-04-07 DIAGNOSIS — E118 Type 2 diabetes mellitus with unspecified complications: Secondary | ICD-10-CM

## 2020-04-08 ENCOUNTER — Other Ambulatory Visit: Payer: Self-pay

## 2020-04-08 ENCOUNTER — Ambulatory Visit: Payer: Medicare Other | Admitting: Pharmacist

## 2020-04-08 DIAGNOSIS — I10 Essential (primary) hypertension: Secondary | ICD-10-CM

## 2020-04-08 DIAGNOSIS — E118 Type 2 diabetes mellitus with unspecified complications: Secondary | ICD-10-CM

## 2020-04-08 DIAGNOSIS — I2510 Atherosclerotic heart disease of native coronary artery without angina pectoris: Secondary | ICD-10-CM

## 2020-04-08 DIAGNOSIS — J449 Chronic obstructive pulmonary disease, unspecified: Secondary | ICD-10-CM

## 2020-04-08 DIAGNOSIS — E785 Hyperlipidemia, unspecified: Secondary | ICD-10-CM

## 2020-04-08 NOTE — Chronic Care Management (AMB) (Signed)
Chronic Care Management Pharmacy  Name: Victoria [name]  MRN: [id number] DOB: 1946/02/12    Chief Complaint/ HPI  Victoria [name],  74 y.o. , female presents for their Initial CCM visit with the clinical pharmacist via telephone due to COVID-19 Pandemic.  PCP : Janith Lima, MD Patient Care Team: Janith Lima, MD as PCP - General (Internal Medicine) Troy Sine, MD as PCP - Cardiology (Cardiology) Brand Males, MD as Consulting Physician (Pulmonary Disease) Arvella Nigh, MD as Consulting Physician (Obstetrics and Gynecology) Magrinat, Virgie Dad, MD as Consulting Physician (Oncology) Rolm Bookbinder, MD as Consulting Physician (General Surgery) Gery Pray, MD as Consulting Physician (Radiation Oncology) Troy Sine, MD as Consulting Physician (Cardiology) Delice Bison, Charlestine Massed, NP as Nurse Practitioner (Hematology and Oncology) Parrett, Fonnie Mu, NP as Nurse Practitioner (Pulmonary Disease) Renato Shin, MD as Consulting Physician (Endocrinology) Charlton Haws, [place] as Pharmacist (Pharmacist)  Their chronic conditions include: Hypertension, Hyperlipidemia, Diabetes, Coronary Artery Disease, COPD and Osteoporosis, Hx Breast cancer, hx hyperthyroidism s/p RAI 03/2019  Originally from [place], moved to [place] in 1977. Moved to [place] in 1982, and came back to [place] in 1983. Pt is semi-retired from [employer]. 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 She is also active in her church. She has 1 son, 3 grandchildren, 3 great-grandchildren. She lives by herself, she has someone come to help clean.   Office Visits: 12/24/19 Dr Ronnald Ramp OV: CPX. Labs stable. No med changes.  Consult Visit: 03/31/20 NP Wilber Bihari (heme/onc): f/u for breast cancer. Dx 06/2017. S/p L lumpectomy. Tx anastrozole May 2019-2024.  12/20/19 NP Tammy Parrett (pulmonary): f/u COPD, OSA.  Restart CPAP. No med changes. Continue O2  12/07/19 Dr Loanne Drilling (endocrine): well controlled DM, no med changes.  Allergies  Allergen Reactions  . Benicar [Olmesartan] Swelling    Swelling of face and arms   . [name] [Valsartan] Swelling    Swelling of face and arms   . Hydrocodone-Acetaminophen Nausea And Vomiting    Severe vomiting  . Lisinopril Cough  . Monosodium Glutamate Other (See Comments)    Facial swelling per pt  . Codeine Other (See Comments)    jittery  . Lead Acetate Rash  . Nickel Rash    Severe rash to infection: pt is allergic to all metals other than sterling silver or gold jewelry.     Medications: Outpatient Encounter Medications as of 04/08/2020  Medication Sig  . ACCU-CHEK GUIDE test strip USE 1 STRIP ONCE DAILY  . Accu-Chek Softclix Lancets lancets USE 1  TO CHECK GLUCOSE ONCE DAILY  . acetaminophen (TYLENOL) 500 MG tablet Take 1,000 mg by mouth every 6 (six) hours as needed.  . Albuterol Sulfate (PROAIR RESPICLICK) 597 (90 Base) MCG/ACT AEPB Inhale 1-2 puffs into the lungs every 6 (six) hours as needed (for wheezing/shortness of breath).  [name] amLODipine (NORVASC) 10 MG tablet Take 1 tablet (10 mg total) by mouth daily.  [name] anastrozole (ARIMIDEX) 1 MG tablet Take 1 tablet by mouth once daily  . aspirin EC 81 MG tablet Take 1 tablet (81 mg total) by mouth daily.  [name] atorvastatin (LIPITOR) 80 MG tablet Take 1 tablet (80 mg total) by mouth daily at 6 PM.  . Blood Glucose Monitoring Suppl (ACCU-CHEK AVIVA PLUS) w/Device KIT 1 Device by Does not apply route daily. Use to monitor glucose levels once per day; E11.9  . carvedilol (COREG)  3.125 MG tablet Take 1 tablet (3.125 mg total) by mouth 2 (two) times daily.  . Cholecalciferol (VITAMIN D) 125 MCG (5000 UT) CAPS Take 1 capsule by mouth daily.  [name] denosumab (PROLIA) 60 MG/ML SOLN injection Inject 60 mg into the skin every 6 (six) months. Administer in upper arm, thigh, or abdomen  . Fluticasone-Umeclidin-Vilant  (TRELEGY ELLIPTA) 100-62.5-25 MCG/INH AEPB INHALE 1 DOSE INTO THE LUNGS DAILY.  [name] glucose blood test strip USE 1 STRIP ONCE DAILY  . JANUMET XR 50-1000 MG TB24 Take 1 tablet by mouth once daily  . OXYGEN Inhale 2-3 L into the lungs continuous. When exerting self   . potassium chloride SA (KLOR-CON) 20 MEQ tablet Take 1 tablet (20 mEq total) by mouth 2 (two) times daily. (Patient taking differently: Take 20 mEq by mouth daily. )  . roflumilast (DALIRESP) 500 MCG TABS tablet Take 1 tablet (500 mcg total) by mouth daily.  [name] spironolactone (ALDACTONE) 25 MG tablet Take 1 tablet (25 mg total) by mouth daily.  [name] thiamine (VITAMIN B-1) 100 MG tablet Take 1 tablet (100 mg total) by mouth every other day.  . [DISCONTINUED] amLODipine (NORVASC) 10 MG tablet Take 1 tablet (10 mg total) by mouth daily.   No facility-administered encounter medications on file as of 04/08/2020.    Wt Readings from Last 3 Encounters:  03/31/20 211 lb 14.4 oz (96.1 kg)  12/24/19 213 lb (96.6 kg)  12/20/19 212 lb 12.8 oz (96.5 kg)    Current Diagnosis/Assessment:    Goals Addressed            This Visit's Progress   . Pharmacy Care Plan       CARE PLAN ENTRY (see longitudinal plan of care for additional care plan information)  Current Barriers:  . Chronic Disease Management support, education, and care coordination needs related to Hypertension, Hyperlipidemia, Diabetes, Coronary Artery Disease, and COPD   Hypertension BP Readings from Last 3 Encounters:  03/31/20 (!) 160/63  12/24/19 (!) 148/58  12/20/19 127/70 .  Pharmacist Clinical Goal(s): o Over the next 180 days, patient will work with PharmD and providers to maintain BP goal <140/90 . Current regimen:  o Amlodipine 10 mg daily o Carvedilol 3.125 mg twice a day o Spironolactone 25 mg daily . Interventions: o Discussed BP goals and benefits of medications for prevention of heart attack / stroke . Patient self care activities - Over the next 180  days, patient will: o Check BP 3 times a week, document, and provide at future appointments o Ensure daily salt intake < 2300 mg/day  Hyperlipidemia Lab Results  Component Value Date/Time   LDLCALC 92 12/24/2019 02:20 PM   LDLDIRECT 121.0 09/05/2014 04:49 PM .  Pharmacist Clinical Goal(s): o Over the next 180 days, patient will work with PharmD and providers to achieve LDL goal < 70 . Current regimen:  o Atorvastatin 80 mg daily . Interventions: o Discussed cholesterol goals and benefits of medications for prevention of heart attack / stroke . Patient self care activities - Over the next 180 days, patient will: o Continue current medication o Reduce cholesterol in diet  Diabetes Lab Results  Component Value Date/Time   HGBA1C 6.8 (A) 12/07/2019 10:51 AM   HGBA1C 6.9 (A) 10/05/2019 11:05 AM   HGBA1C 7.3 (H) 08/31/2017 07:11 AM   HGBA1C 7.1 (H) 06/23/2016 03:36 PM .  Pharmacist Clinical Goal(s): o Over the next 180 days, patient will work with PharmD and providers to maintain A1c goal <7% .  Current regimen:  o Janumet XR 50-1000 mg daily o Testing supplies . Interventions: o Discussed importance of maintaining sugars at goal to prevent complications of diabetes including kidney damage, retinal damage, and cardiovascular disease . Patient self care activities - Over the next 180 days, patient will: o Check blood sugar once daily, document, and provide at future appointments o Contact provider with any episodes of hypoglycemia  COPD . Pharmacist Clinical Goal(s) o Over the next 180 days, patient will work with PharmD and providers to optimize therapy . Current regimen:  o Trelegy 1 puff daily o Proair Respiclick 1-2 puff as needed o Roflumilast 500 mg daily o Oxygen 2 l/m . Interventions: o Discussed proper inhaler technique and optimal use  . Patient self care activities - Over the next 180 days, patient will: o Continue current medication  Medication  management . Pharmacist Clinical Goal(s): o Over the next 180 days, patient will work with PharmD and providers to achieve optimal medication adherence . Current pharmacy: [facility] . Interventions o Comprehensive medication review performed. o Utilize [facility] for medication synchronization, packaging and delivery . Patient self care activities - Over the next 180 days, patient will: o Focus on medication adherence by pill pack o Take medications as prescribed o Report any questions or concerns to PharmD and/or provider(s)  Initial goal documentation       Hypertension   BP goal is:  <130/80  Office blood pressures are  BP Readings from Last 3 Encounters:  03/31/20 (!) 160/63  12/24/19 (!) 148/58  12/20/19 127/70   Pulse Readings from Last 3 Encounters:  03/31/20 83  12/24/19 85  12/20/19 86   Lab Results  Component Value Date   CREATININE 0.74 12/24/2019   BUN 13 12/24/2019   GFR 92.48 06/20/2019   GFRNONAA 79 12/24/2019   GFRAA 92 12/24/2019   NA 138 12/24/2019   K 4.4 12/24/2019   CALCIUM 9.7 12/24/2019   CO2 32 12/24/2019   Patient checks BP at home 3-5x per week Patient home BP readings are ranging: 124/86  Patient has failed these meds in the past: n/a Patient is currently controlled on the following medications:  . Amlodipine 10 mg daily AM . Carvedilol 3.125 mg BID  . Spironolactone 25 mg daily AM  We discussed diet and exercise extensively; BP goals; benefits of medications; pt denies side effects  Plan  Continue current medications and control with diet and exercise   Hyperlipidemia   LDL goal < 100 Aortic atherosclerosis Coronary calcium score 625 (94th percentile) on coronary CT 02/2018  Last lipids Lab Results  Component Value Date   CHOL 170 12/24/2019   HDL 48 (L) 12/24/2019   LDLCALC 92 12/24/2019   LDLDIRECT 121.0 09/05/2014   TRIG 201 (H) 12/24/2019   CHOLHDL 3.5 12/24/2019   Hepatic Function Latest Ref Rng & Units  09/19/2019 03/21/2019 01/08/2019  Total Protein 6.5 - 8.1 g/dL 6.9 7.1 6.6  Albumin 3.5 - 5.0 g/dL 3.3(L) 3.8 4.1  AST 15 - 41 U/L 10(L) 13(L) 12  ALT 0 - 44 U/L <_0 Alk Phosphatase 38 - 126 U/L 92 84 88  Total Bilirubin 0.3 - 1.2 mg/dL 0.3 0.4 0.3  Bilirubin, Direct 0.0 - 0.3 mg/dL - - -    The 10-year ASCVD risk score Mikey Bussing DC Jr., et al., 2013) is: 37.2%   Values used to calculate the score:     Age: 33 years     Sex: Female  Is Non-Hispanic African American: Yes     Diabetic: Yes     Tobacco smoker: No     Systolic Blood Pressure: 254 mmHg     Is BP treated: Yes     HDL Cholesterol: 48 mg/dL     Total Cholesterol: 170 mg/dL   Patient has failed these meds in past: n/a Patient is currently controlled on the following medications:  . Atorvastatin 80 mg daily PM . Aspirin 81 mg daily  We discussed:  diet and exercise extensively; Cholesterol goals; benefits of statin for ASCVD risk reduction; LDL goal < 70 vs <100 - pt is higher risk for ASCVD given atherosclerosis on CT and high coronary calcium score, aggressive LDL goal may be appropriate; pt does not wish to change medication right now so will focus on diet changes  Plan  Continue current medications and control with diet and exercise  Diabetes   A1c goal <7%  Recent Relevant Labs: Lab Results  Component Value Date/Time   HGBA1C 6.8 (A) 12/07/2019 10:51 AM   HGBA1C 6.9 (A) 10/05/2019 11:05 AM   HGBA1C 7.3 (H) 08/31/2017 07:11 AM   HGBA1C 7.1 (H) 06/23/2016 03:36 PM   GFR 92.48 06/20/2019 11:50 AM   GFR 97.05 04/19/2019 10:09 AM   MICROALBUR 1.4 04/19/2019 12:08 PM   MICROALBUR 1.3 01/18/2017 04:05 PM    Last diabetic Eye exam:  Lab Results  Component Value Date/Time   HMDIABEYEEXA No Retinopathy 03/28/2019 12:00 AM    Last diabetic Foot exam: No results found for: HMDIABFOOTEX   Checking BG: Daily  Recent FBG Readings: 124  Patient has failed these meds in past: n/a Patient is currently controlled  on the following medications: . Janumet XR 50-1000 mg daily . Carb Intercept ([employer]) . Testing supplies   We discussed: diet and exercise extensively; Discussed importance of maintaining sugars at goal to prevent complications of diabetes including kidney damage, retinal damage, and cardiovascular disease  Plan  Continue current medications and control with diet and exercise   COPD   Last spirometry score: 04/15/2016 -FEV1 34% predicted -FEV1/FVC 0.47  Gold Grade: Gold 3 (FEV1 30-49%) Current COPD Classification:  B (high sx, <2 exacerbations/yr)   CAT Score 07/25/2019 09/14/2017  Total CAT Score 12 24   Lab Results  Component Value Date/Time   EOSPCT 2.2 12/24/2019 02:20 PM   EOSABS 183 12/24/2019 02:20 PM   Patient has failed these meds in past: n/a Patient is currently controlled on the following medications:  . Trelegy 1 puff daily . Proair Respiclick 1-2 puff PRN . Roflumilast 500 mg daily . Oxygen 2 l/m - portable and stand-alone  Using maintenance inhaler regularly? Yes Frequency of rescue inhaler use:  3-5x times per week  We discussed:  proper inhaler technique; she uses rescue inhaler more often in cold weather; pt is interested in being in a clinical trial for new medication - Dr Chase Caller is working with her on this;   Plan  Continue current medications and control with diet and exercise  Osteoporosis   Last DEXA Scan: 04/10/2019  T-Score femoral neck: -2.2  T-Score total hip: n/a  T-Score lumbar spine: -1.5  T-Score forearm radius: n/a  10-year probability of major osteoporotic fracture: n/a  10-year probability of hip fracture: n/a  Last vitamin D/Calcium Lab Results  Component Value Date   VD25OH 46.08 02/28/2014   CALCIUM 9.7 12/24/2019   Patient has failed these meds in past: n/a Patient is currently controlled on the following medications:  .  Prolia 60 mg injection q6 months (last given 10/08/19) . Vitamin D 5000 IU daily  We  discussed:  Recommend [phone] units of vitamin D daily. Recommend 1200 mg of calcium daily from dietary and supplemental sources. Recommend weight-bearing and muscle strengthening exercises for building and maintaining bone density.  Plan  Continue current medications and control with diet and exercise  Hx breast cancer   Dx 2019, s/p lumpectomy  Patient has failed these meds in past: n/a Patient is currently controlled on the following medications:  . Anastrozole 1 mg daily (plan 09/2017 - 09/2022)  We discussed:  Length of therapy for anti-estrogen treatment; pt denies issues or side effects  Plan  Continue current medications  Vaccines   Reviewed and discussed patient's vaccination history.    Immunization History  Administered Date(s) Administered  . Fluad Quad(high Dose 65+) 01/17/2019  . Influenza Split 01/16/2011  . Influenza, High Dose Seasonal PF 01/13/2017, 02/09/2018  . Influenza,inj,Quad PF,6+ Mos 02/01/2013, 02/27/2014, 04/07/2015, 01/08/2016  . PFIZER SARS-COV-2 Vaccination 06/06/2019, 06/27/2019  . Pneumococcal Conjugate-13 02/01/2013  . Pneumococcal Polysaccharide-23 10/29/2014  . Td 12/06/2011   Pt reports she received 1 dose of Shingrix at the pharmacy. Discussed it is a 2-shot series so she should get the 2nd dose at least 2 months after the first.  Plan  Recommended patient receive Shingles vaccine dose #2 at pharmacy and Flu vaccine next week at Endocrine appt  Health Maintenance   Patient is currently controlled on the following medications:  . Thiamine 100 mg QOD . Klor-Con 20 mEq daily  We discussed:  Pt reports taking KCl 1 daily, less than the 2 daily prescribed. She has always taken it this way, and K was WNL from most recent BMP, advised patient not to change how she takes it.  Plan  Continue current medications  Medication Management   [name] preferred pharmacy is: [facility] Uses pill box? Yes Pt endorses 100% compliance  We  discussed: Verbal consent obtained for [facility] enhanced pharmacy services (medication synchronization, adherence packaging, delivery coordination). A medication sync plan was created to allow patient to get all medications delivered once every 30 to 90 days per patient preference. Patient understands they have freedom to choose pharmacy and clinical pharmacist will coordinate care between all prescribers and [facility].   Plan  Utilize [facility] for medication synchronization, packaging and delivery    Follow up: 6 month phone visit  Charlene Brooke, PharmD, [facility] Clinical Pharmacist [facility] [phone]

## 2020-04-09 ENCOUNTER — Other Ambulatory Visit: Payer: Self-pay

## 2020-04-09 ENCOUNTER — Telehealth: Payer: Self-pay | Admitting: Internal Medicine

## 2020-04-09 ENCOUNTER — Other Ambulatory Visit: Payer: Self-pay | Admitting: *Deleted

## 2020-04-09 DIAGNOSIS — E118 Type 2 diabetes mellitus with unspecified complications: Secondary | ICD-10-CM

## 2020-04-09 MED ORDER — ACCU-CHEK SOFTCLIX LANCETS MISC: 0 refills | Status: DC

## 2020-04-09 MED ORDER — ANASTROZOLE 1 MG PO TABS
1.0000 mg | ORAL_TABLET | Freq: Every day | ORAL | 0 refills | Status: DC
Start: 2020-04-09 — End: 2020-04-14

## 2020-04-09 MED ORDER — TRELEGY ELLIPTA 100-62.5-25 MCG/INH IN AEPB
INHALATION_SPRAY | RESPIRATORY_TRACT | 5 refills | Status: DC
Start: 2020-04-09 — End: 2020-12-21

## 2020-04-09 MED ORDER — DALIRESP 500 MCG PO TABS: 500.0000 ug | ORAL_TABLET | Freq: Every day | ORAL | 1 refills | Status: DC

## 2020-04-09 MED ORDER — CARVEDILOL 3.125 MG PO TABS
3.1250 mg | ORAL_TABLET | Freq: Two times a day (BID) | ORAL | 0 refills | Status: DC
Start: 2020-04-09 — End: 2020-04-23

## 2020-04-09 MED ORDER — ACCU-CHEK GUIDE VI STRP: ORAL_STRIP | 0 refills | Status: DC

## 2020-04-09 MED ORDER — JANUMET XR 50-1000 MG PO TB24: 1.0000 | ORAL_TABLET | Freq: Every day | ORAL | 0 refills | Status: DC

## 2020-04-09 NOTE — Patient Instructions (Addendum)
Visit Information  Phone number for Pharmacist: [phone]  Thank you for meeting with me to discuss your medications! I look forward to working with you to achieve your health care goals. Below is a summary of what we talked about during the visit:  Goals Addressed            This Visit's Progress   . Pharmacy Care Plan       CARE PLAN ENTRY (see longitudinal plan of care for additional care plan information)  Current Barriers:  . Chronic Disease Management support, education, and care coordination needs related to Hypertension, Hyperlipidemia, Diabetes, Coronary Artery Disease, and COPD   Hypertension BP Readings from Last 3 Encounters:  03/31/20 (!) 160/63  12/24/19 (!) 148/58  12/20/19 127/70 .  Pharmacist Clinical Goal(s): o Over the next 180 days, patient will work with PharmD and providers to maintain BP goal <140/90 . Current regimen:  o Amlodipine 10 mg daily o Carvedilol 3.125 mg twice a day o Spironolactone 25 mg daily . Interventions: o Discussed BP goals and benefits of medications for prevention of heart attack / stroke . Patient self care activities - Over the next 180 days, patient will: o Check BP 3 times a week, document, and provide at future appointments o Ensure daily salt intake < 2300 mg/day  Hyperlipidemia Lab Results  Component Value Date/Time   LDLCALC 92 12/24/2019 02:20 PM   LDLDIRECT 121.0 09/05/2014 04:49 PM .  Pharmacist Clinical Goal(s): o Over the next 180 days, patient will work with PharmD and providers to maintain LDL goal < 100 . Current regimen:  o Atorvastatin 80 mg daily . Interventions: o Discussed cholesterol goals and benefits of medications for prevention of heart attack / stroke . Patient self care activities - Over the next 180 days, patient will: o Continue current medication o Reduce cholesterol in diet  Diabetes Lab Results  Component Value Date/Time   HGBA1C 6.8 (A) 12/07/2019 10:51 AM   HGBA1C 6.9 (A)  10/05/2019 11:05 AM   HGBA1C 7.3 (H) 08/31/2017 07:11 AM   HGBA1C 7.1 (H) 06/23/2016 03:36 PM .  Pharmacist Clinical Goal(s): o Over the next 180 days, patient will work with PharmD and providers to maintain A1c goal <7% . Current regimen:  o Janumet XR 50-1000 mg daily o Testing supplies . Interventions: o Discussed importance of maintaining sugars at goal to prevent complications of diabetes including kidney damage, retinal damage, and cardiovascular disease . Patient self care activities - Over the next 180 days, patient will: o Check blood sugar once daily, document, and provide at future appointments o Contact provider with any episodes of hypoglycemia  COPD . Pharmacist Clinical Goal(s) o Over the next 180 days, patient will work with PharmD and providers to optimize therapy . Current regimen:  o Trelegy 1 puff daily o Proair Respiclick 1-2 puff as needed o Roflumilast 500 mg daily o Oxygen 2 l/m . Interventions: o Discussed proper inhaler technique and optimal use  . Patient self care activities - Over the next 180 days, patient will: o Continue current medication  Medication management . Pharmacist Clinical Goal(s): o Over the next 180 days, patient will work with PharmD and providers to achieve optimal medication adherence . Current pharmacy: [facility] . Interventions o Comprehensive medication review performed. o Utilize [facility] pharmacy for medication synchronization, packaging and delivery . Patient self care activities - Over the next 180 days, patient will: o Focus on medication adherence by pill pack o Take medications as prescribed o  Report any questions or concerns to PharmD and/or provider(s)  Initial goal documentation      Ms. Henderson was given information about Chronic Care Management services today including:  1. CCM service includes personalized support from designated clinical staff supervised by her physician, including individualized plan of care and  coordination with other care providers 2. 24/7 contact phone numbers for assistance for urgent and routine care needs. 3. Standard insurance, coinsurance, copays and deductibles apply for chronic care management only during months in which we provide at least 20 minutes of these services. Most insurances cover these services at 100%, however patients may be responsible for any copay, coinsurance and/or deductible if applicable. This service may help you avoid the need for more expensive face-to-face services. 4. Only one practitioner may furnish and bill the service in a calendar month. 5. The patient may stop CCM services at any time (effective at the end of the month) by phone call to the office staff.  Patient agreed to services and verbal consent obtained.   The patient verbalized understanding of instructions, educational materials, and care plan provided today and agreed to receive a mailed copy of patient instructions, educational materials, and care plan.  Telephone follow up appointment with pharmacy team member scheduled for: 6 months  Charlene Brooke, PharmD, BCACP Clinical Pharmacist [facility] [phone]  Cholesterol Content in Foods Cholesterol is a waxy, fat-like substance that helps to carry fat in the blood. The body needs cholesterol in small amounts, but too much cholesterol can cause damage to the arteries and heart. Most people should eat less than 200 milligrams (mg) of cholesterol a day. Foods with cholesterol  Cholesterol is found in animal-based foods, such as meat, seafood, and dairy. Generally, low-fat dairy and lean meats have less cholesterol than full-fat dairy and fatty meats. The milligrams of cholesterol per serving (mg per serving) of common cholesterol-containing foods are listed below. Meat and other proteins  Egg -- one large whole egg has 186 mg.  Veal shank -- 4 oz has 141 mg.  Lean ground [place] (93% lean) -- 4 oz has  118 mg.  Fat-trimmed lamb loin -- 4 oz has 106 mg.  Lean ground beef (90% lean) -- 4 oz has 100 mg.  Lobster -- 3.5 oz has 90 mg.  Pork loin chops -- 4 oz has 86 mg.  Canned salmon -- 3.5 oz has 83 mg.  Fat-trimmed beef top loin -- 4 oz has 78 mg.  Frankfurter -- 1 frank (3.5 oz) has 77 mg.  Crab -- 3.5 oz has 71 mg.  Roasted chicken without skin, white meat -- 4 oz has 66 mg.  Light bologna -- 2 oz has 45 mg.  Deli-cut [place] -- 2 oz has 31 mg.  Canned tuna -- 3.5 oz has 31 mg.  Berniece Salines -- 1 oz has 29 mg.  Oysters and mussels (raw) -- 3.5 oz has 25 mg.  Mackerel -- 1 oz has 22 mg.  Trout -- 1 oz has 20 mg.  Pork sausage -- 1 link (1 oz) has 17 mg.  Salmon -- 1 oz has 16 mg.  Tilapia -- 1 oz has 14 mg. Dairy  Soft-serve ice cream --  cup (4 oz) has 103 mg.  Whole-milk yogurt -- 1 cup (8 oz) has 29 mg.  Cheddar cheese -- 1 oz has 28 mg.  American cheese -- 1 oz has 28 mg.  Whole milk -- 1 cup (8 oz) has 23 mg.  2% milk -- 1 cup (8 oz) has 18 mg.  Cream cheese -- 1 tablespoon (Tbsp) has 15 mg.  Cottage cheese --  cup (4 oz) has 14 mg.  Low-fat (1%) milk -- 1 cup (8 oz) has 10 mg.  Sour cream -- 1 Tbsp has 8.5 mg.  Low-fat yogurt -- 1 cup (8 oz) has 8 mg.  Nonfat Greek yogurt -- 1 cup (8 oz) has 7 mg.  Half-and-half cream -- 1 Tbsp has 5 mg. Fats and oils  Cod liver oil -- 1 tablespoon (Tbsp) has 82 mg.  Butter -- 1 Tbsp has 15 mg.  Lard -- 1 Tbsp has 14 mg.  Bacon grease -- 1 Tbsp has 14 mg.  Mayonnaise -- 1 Tbsp has 5-10 mg.  Margarine -- 1 Tbsp has 3-10 mg. Exact amounts of cholesterol in these foods may vary depending on specific ingredients and brands. Foods without cholesterol Most plant-based foods do not have cholesterol unless you combine them with a food that has cholesterol. Foods without cholesterol include:  Grains and cereals.  Vegetables.  Fruits.  Vegetable oils, such as olive, canola, and sunflower oil.  Legumes,  such as peas, beans, and lentils.  Nuts and seeds.  Egg whites. Summary  The body needs cholesterol in small amounts, but too much cholesterol can cause damage to the arteries and heart.  Most people should eat less than 200 milligrams (mg) of cholesterol a day. This information is not intended to replace advice given to you by your health care provider. Make sure you discuss any questions you have with your health care provider. Document Revised: 04/15/2017 Document Reviewed: 12/28/2016 Elsevier Patient Education  [place].

## 2020-04-09 NOTE — Telephone Encounter (Signed)
Medication requested to be resent to a different pharmacy, [facility]. Please address. This is Dr. Evette Georges pt

## 2020-04-09 NOTE — Telephone Encounter (Signed)
Spoke with pharmacist at [facility] and advised that Trelegy and Daliresp refills were sent to them. Proair was sent to another pharmacy 9 days ago with 5 refills so we will not be able to refill at this time. He verbalized understanding. Nothing further needed at this time.

## 2020-04-11 ENCOUNTER — Other Ambulatory Visit: Payer: Self-pay | Admitting: Internal Medicine

## 2020-04-11 DIAGNOSIS — E785 Hyperlipidemia, unspecified: Secondary | ICD-10-CM

## 2020-04-11 DIAGNOSIS — I10 Essential (primary) hypertension: Secondary | ICD-10-CM

## 2020-04-11 DIAGNOSIS — I2510 Atherosclerotic heart disease of native coronary artery without angina pectoris: Secondary | ICD-10-CM

## 2020-04-11 DIAGNOSIS — E876 Hypokalemia: Secondary | ICD-10-CM

## 2020-04-11 MED ORDER — ATORVASTATIN CALCIUM 80 MG PO TABS: 80.0000 mg | ORAL_TABLET | Freq: Every day | ORAL | 0 refills | Status: DC

## 2020-04-11 MED ORDER — POTASSIUM CHLORIDE CRYS ER 20 MEQ PO TBCR: 20.0000 meq | EXTENDED_RELEASE_TABLET | Freq: Two times a day (BID) | ORAL | 0 refills | Status: DC

## 2020-04-11 MED ORDER — SPIRONOLACTONE 25 MG PO TABS: 25.0000 mg | ORAL_TABLET | Freq: Every day | ORAL | 0 refills | Status: DC

## 2020-04-11 MED ORDER — AMLODIPINE BESYLATE 10 MG PO TABS: 10.0000 mg | ORAL_TABLET | Freq: Every day | ORAL | 1 refills | Status: DC

## 2020-04-13 ENCOUNTER — Other Ambulatory Visit: Payer: Self-pay | Admitting: Oncology

## 2020-04-13 DIAGNOSIS — J449 Chronic obstructive pulmonary disease, unspecified: Secondary | ICD-10-CM | POA: Diagnosis not present

## 2020-04-14 NOTE — Addendum Note (Signed)
Addended by: Hinda Kehr on: 04/14/2020 08:31 AM   Modules accepted: Orders

## 2020-04-18 ENCOUNTER — Other Ambulatory Visit: Payer: Self-pay

## 2020-04-18 ENCOUNTER — Encounter: Payer: Self-pay | Admitting: Endocrinology

## 2020-04-18 ENCOUNTER — Ambulatory Visit (INDEPENDENT_AMBULATORY_CARE_PROVIDER_SITE_OTHER): Payer: Medicare Other | Admitting: Endocrinology

## 2020-04-18 VITALS — BP 178/62 | HR 93 | Ht 64.5 in | Wt 215.0 lb

## 2020-04-18 DIAGNOSIS — E118 Type 2 diabetes mellitus with unspecified complications: Secondary | ICD-10-CM

## 2020-04-18 LAB — POCT GLYCOSYLATED HEMOGLOBIN (HGB A1C): Hemoglobin A1C: 6.8 % — AB (ref 4.0–5.6)

## 2020-04-18 LAB — T4, FREE: Free T4: 0.68 ng/dL (ref 0.60–1.60)

## 2020-04-18 LAB — TSH: TSH: 2.2 u[IU]/mL (ref 0.35–4.50)

## 2020-04-18 MED ORDER — JANUMET XR 50-1000 MG PO TB24: 2.0000 | ORAL_TABLET | Freq: Every day | ORAL | 3 refills | Status: DC

## 2020-04-18 NOTE — Progress Notes (Signed)
Subjective:    Patient ID: Victoria [name], female    DOB: Dec 07, 1945, 74 y.o.   MRN: [id number]  HPI Pt returns for f/u of diabetes mellitus: DM type: 2 Dx'ed: 3557 Complications: CAD and PAD Therapy: Janumet GDM: never DKA: never Severe hypoglycemia: never Pancreatitis: never Pancreatic imaging: normal on 2008 CT.   Other: edema limits rx options; fructosamine showed better glycemic control than A1c.   Interval history: pt says cbg's are well-controlled.  Pt also has hyperthyroidism (dx'ed 2019; US showed MNG; bx of RLL and RML nodules in 2019 showed beth cat 2; she had RAI 11/20; since then, she is euthyroid off rx).   Past Medical History:  Diagnosis Date  . Abdominal aneurysm ([place])    Dr. Oneida Alar follows lLOV 2 ''17 per pt "around 2 cm"  . Anemia    as a child  . Arthritis    left ankle, right knee, right SI joint, wrists, lower back  . Breast cancer in female [facility])    Left  . COPD (chronic obstructive pulmonary disease) ([place])    ephysema-Dr. Chase Caller  . Dyspnea   . Headache    as a child would have terrible headaches during season changes  . 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 Hyperlipidemia   . Hypertension   . Multiple thyroid nodules   . Murmur, cardiac 1950  . Osteoporosis   . Pneumonia   . Pre-diabetes   . Requires continuous at home supplemental oxygen    2 L 24/7  . Sebaceous cyst    hairline sebaceous cyst left posterior neck to be excised 04-13-16 by Dr. Harlow Asa in [place] [facility]  . Sleep apnea    cpap used sometimes, uses oxygen concentrator 2 l/m nasally bedtime  . Varicella as child    Past Surgical History:  Procedure Laterality Date  . APPENDECTOMY     2008  . BREAST LUMPECTOMY Left 08/31/2017  . BREAST LUMPECTOMY WITH RADIOACTIVE SEED LOCALIZATION Left 08/10/2017   Procedure: BREAST LUMPECTOMY WITH RADIOACTIVE SEED LOCALIZATION;  Surgeon: Rolm Bookbinder, MD;  Location: [place];  Service: General;  Laterality: Left;  .  [place]  . COLONOSCOPY    . COLONOSCOPY WITH PROPOFOL N/A 04/16/2016   Procedure: COLONOSCOPY WITH PROPOFOL;  Surgeon: Carol Ada, MD;  Location: [facility];  Service: Endoscopy;  Laterality: N/A;  . CYST REMOVAL NECK Left 04/13/2016   Procedure: EXCISION OF SEBACEOUS CYST LEFT POSTERIOR NECK;  Surgeon: Armandina Gemma, MD;  Location: [place];  Service: General;  Laterality: Left;  . Excision of Pelvic Absess, Right Ovary     2008  . RE-EXCISION OF BREAST CANCER,SUPERIOR MARGINS Left 08/31/2017   Procedure: RE-EXCISION OF LEFT  BREAST MARGINS ERAS PATHWAY;  Surgeon: Rolm Bookbinder, MD;  Location: [place];  Service: General;  Laterality: Left;  . TUBAL LIGATION  1980    Social History   Socioeconomic History  . Marital status: Divorced    Spouse name: Not on file  . Number of children: 1  . Years of education: 90  . Highest education level: Not on file  Occupational History  . Occupation: Social worker, Technical sales engineer: [employer]  Tobacco Use  . Smoking status: Former Smoker    Packs/day: 1.00    Years: 50.00    Pack years: 50.00    Types: Cigarettes    Quit date: 04/16/2011    Years since quitting: 9.0  .  Smokeless tobacco: Never Used  . Tobacco comment: quit that date when she had to go to ER   Vaping Use  . Vaping Use: Never used  Substance and Sexual Activity  . Alcohol use: Yes    Alcohol/week: 0.0 standard drinks    Comment: rare occasion  . Drug use: No    Comment: no marijuana since 2012  . Sexual activity: Not Currently  Other Topics Concern  . Not on file  Social History Narrative   HSG; [name], [place]. . Married '69 - 9 yrs/divorced. 1 son - '72; no grandchildren.   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 No h/o physical or sexual abuse. ACP - no living will - wants information. Provided packet of information. On 08/05/2011: she stated she was distant cousins to celebrities  [name] and [name]      Social Determinants of Health   Financial Resource Strain: Low Risk   . Difficulty of Paying Living Expenses: Not hard at all  Food Insecurity: No Food Insecurity  . Worried About Charity fundraiser in the Last Year: Never true  . Ran Out of Food in the Last Year: Never true  Transportation Needs: No Transportation Needs  . Lack of Transportation (Medical): No  . Lack of Transportation (Non-Medical): No  Physical Activity: Sufficiently Active  . Days of Exercise per Week: 5 days  . Minutes of Exercise per Session: 30 min  Stress: No Stress Concern Present  . Feeling of Stress : Not at all  Social Connections: Socially Integrated  . Frequency of Communication with Friends and Family: More than three times a week  . Frequency of Social Gatherings with Friends and Family: More than three times a week  . Attends Religious Services: More than 4 times per year  . Active Member of Clubs or Organizations: Yes  . Attends Archivist Meetings: More than 4 times per year  . Marital Status: Married  Human resources officer Violence:   . Fear of Current or Ex-Partner: Not on file  . Emotionally Abused: Not on file  . Physically Abused: Not on file  . Sexually Abused: Not on file    Current Outpatient Medications on File Prior to Visit  Medication Sig Dispense Refill  . Accu-Chek Softclix Lancets lancets USE 1  TO CHECK GLUCOSE ONCE DAILY 100 each 0  . acetaminophen (TYLENOL) 500 MG tablet Take 1,000 mg by mouth every 6 (six) hours as needed.    . Albuterol Sulfate (PROAIR RESPICLICK) 009 (90 Base) MCG/ACT AEPB Inhale 1-2 puffs into the lungs every 6 (six) hours as needed (for wheezing/shortness of breath). 1 each 5  . amLODipine (NORVASC) 10 MG tablet Take 1 tablet (10 mg total) by mouth daily. 90 tablet 1  . anastrozole (ARIMIDEX) 1 MG tablet Take 1 tablet by mouth once daily 90 tablet 0  . aspirin EC 81 MG tablet Take 1 tablet (81 mg total) by mouth  daily. 90 tablet 3  . atorvastatin (LIPITOR) 80 MG tablet Take 1 tablet (80 mg total) by mouth daily at 6 PM. 90 tablet 0  . Blood Glucose Monitoring Suppl (ACCU-CHEK AVIVA PLUS) w/Device KIT 1 Device by Does not apply route daily. Use to monitor glucose levels once per day; E11.9 1 kit 0  . carvedilol (COREG) 3.125 MG tablet Take 1 tablet (3.125 mg total) by mouth 2 (two) times daily. 180 tablet 0  . Cholecalciferol (VITAMIN D) 125 MCG (5000 UT) CAPS Take  1 capsule by mouth daily.    [name] denosumab (PROLIA) 60 MG/ML SOLN injection Inject 60 mg into the skin every 6 (six) months. Administer in upper arm, thigh, or abdomen    . Fluticasone-Umeclidin-Vilant (TRELEGY ELLIPTA) 100-62.5-25 MCG/INH AEPB INHALE 1 DOSE INTO THE LUNGS DAILY. 60 each 5  . glucose blood (ACCU-CHEK GUIDE) test strip USE 1 STRIP ONCE DAILY 100 each 0  . glucose blood test strip USE 1 STRIP ONCE DAILY 100 each 12  . OXYGEN Inhale 2-3 L into the lungs continuous. When exerting self     . potassium chloride SA (KLOR-CON) 20 MEQ tablet Take 1 tablet (20 mEq total) by mouth 2 (two) times daily. 180 tablet 0  . roflumilast (DALIRESP) 500 MCG TABS tablet Take 1 tablet (500 mcg total) by mouth daily. 90 tablet 1  . spironolactone (ALDACTONE) 25 MG tablet Take 1 tablet (25 mg total) by mouth daily. 90 tablet 0  . thiamine (VITAMIN B-1) 100 MG tablet Take 1 tablet (100 mg total) by mouth every other day. 45 tablet 1  . [DISCONTINUED] amLODipine (NORVASC) 10 MG tablet Take 1 tablet (10 mg total) by mouth daily. 30 tablet 0   No current facility-administered medications on file prior to visit.    Allergies  Allergen Reactions  . Benicar [Olmesartan] Swelling    Swelling of face and arms   . [name] [Valsartan] Swelling    Swelling of face and arms   . Hydrocodone-Acetaminophen Nausea And Vomiting    Severe vomiting  . Lisinopril Cough  . Monosodium Glutamate Other (See Comments)    Facial swelling per pt  . Codeine Other (See  Comments)    jittery  . Lead Acetate Rash  . Nickel Rash    Severe rash to infection: pt is allergic to all metals other than sterling silver or gold jewelry.     Family History  Problem Relation Age of Onset  . Heart disease Mother        MI - fatal  . Hypertension Mother   . Stroke Father 68       fatal  . Alzheimer's disease Father   . Alzheimer's disease Brother   . Hyperlipidemia Brother   . Hypertension Brother   . Diabetes Brother   . Hypertension Brother   . Hyperlipidemia Brother   . Breast cancer Paternal Grandmother     BP (!) 178/62 (BP Location: Left Arm, Patient Position: Sitting, Cuff Size: Large)   Pulse 93   Ht 5' 4.5" (1.638 m)   Wt 215 lb (97.5 kg)   SpO2 95% Comment: 3 liters o2  BMI 36.33 kg/m    Review of Systems     Objective:   Physical Exam VITAL SIGNS:  See vs page GENERAL: no distress.  Has 02 on Pulses: dorsalis pedis intact bilat.   MSK: no deformity of the feet CV: trace bilat leg edema.   Skin:  no ulcer on the feet, but the skin is dry.  normal color and temp on the feet. Neuro: sensation is intact to touch on the feet.   Lab Results  Component Value Date   HGBA1C 6.8 (A) 04/18/2020      Assessment & Plan:  Type 2 DM, with PAD: uncontrolled, as goal A1c is low 6's HTN: is noted today.  Hyperthyroidism: reheek today.   Patient Instructions  Your blood pressure is high today.  Please see your primary care provider soon, to have it rechecked Please increase the Janumet to  2 pills per day.    check your blood sugar once a day.  vary the time of day when you check, between before the 3 meals, and at bedtime.  also check if you have symptoms of your blood sugar being too high or too low.  please keep a record of the readings and bring it to your next appointment here (or you can bring the meter itself).  You can write it on any piece of paper.  please call us sooner if your blood sugar goes below 70, or if you have a lot of readings  over 200. Please come back for a follow-up appointment in 3-4 months.

## 2020-04-18 NOTE — Patient Instructions (Addendum)
Your blood pressure is high today.  Please see your primary care provider soon, to have it rechecked Please increase the Janumet to 2 pills per day.    check your blood sugar once a day.  vary the time of day when you check, between before the 3 meals, and at bedtime.  also check if you have symptoms of your blood sugar being too high or too low.  please keep a record of the readings and bring it to your next appointment here (or you can bring the meter itself).  You can write it on any piece of paper.  please call us sooner if your blood sugar goes below 70, or if you have a lot of readings over 200. Please come back for a follow-up appointment in 3-4 months.

## 2020-04-21 ENCOUNTER — Telehealth: Payer: Self-pay | Admitting: Endocrinology

## 2020-04-21 NOTE — Telephone Encounter (Signed)
Patient states she is returning a call (she thinks it is about her lab results). Patient requests to be called at ph# [phone].

## 2020-04-21 NOTE — Telephone Encounter (Signed)
Called patient back discussed blood work. Patient verbalized understanding.

## 2020-04-23 ENCOUNTER — Other Ambulatory Visit: Payer: Self-pay

## 2020-04-23 ENCOUNTER — Encounter: Payer: Self-pay | Admitting: Cardiovascular Disease

## 2020-04-23 ENCOUNTER — Ambulatory Visit (INDEPENDENT_AMBULATORY_CARE_PROVIDER_SITE_OTHER): Payer: Medicare Other | Admitting: Cardiovascular Disease

## 2020-04-23 DIAGNOSIS — I10 Essential (primary) hypertension: Secondary | ICD-10-CM | POA: Diagnosis not present

## 2020-04-23 DIAGNOSIS — I700 Atherosclerosis of aorta: Secondary | ICD-10-CM

## 2020-04-23 DIAGNOSIS — E118 Type 2 diabetes mellitus with unspecified complications: Secondary | ICD-10-CM

## 2020-04-23 DIAGNOSIS — Z853 Personal history of malignant neoplasm of breast: Secondary | ICD-10-CM

## 2020-04-23 DIAGNOSIS — E785 Hyperlipidemia, unspecified: Secondary | ICD-10-CM

## 2020-04-23 DIAGNOSIS — J449 Chronic obstructive pulmonary disease, unspecified: Secondary | ICD-10-CM

## 2020-04-23 DIAGNOSIS — I2510 Atherosclerotic heart disease of native coronary artery without angina pectoris: Secondary | ICD-10-CM

## 2020-04-23 DIAGNOSIS — G4733 Obstructive sleep apnea (adult) (pediatric): Secondary | ICD-10-CM

## 2020-04-23 MED ORDER — CARVEDILOL 6.25 MG PO TABS
6.2500 mg | ORAL_TABLET | Freq: Two times a day (BID) | ORAL | 3 refills | Status: DC
Start: 2020-04-23 — End: 2021-11-06

## 2020-04-23 NOTE — Patient Instructions (Signed)
Medication Instructions:  INCREASE your carvedilol (Coreg) to 6.25 mg twice a day *If you need a refill on your cardiac medications before your next appointment, please call your pharmacy*   Lab Work: None ordered If you have labs (blood work) drawn today and your tests are completely normal, you will receive your results only by: [name] MyChart Message (if you have MyChart) OR . A paper copy in the mail If you have any lab test that is abnormal or we need to change your treatment, we will call you to review the results.   Testing/Procedures: None ordered   Follow-Up: At [facility], you and your health needs are our priority.  As part of our continuing mission to provide you with exceptional heart care, we have created designated Provider Care Teams.  These Care Teams include your primary Cardiologist (physician) and Advanced Practice Providers (APPs -  Physician Assistants and Nurse Practitioners) who all work together to provide you with the care you need, when you need it.  We recommend signing up for the patient portal called "MyChart".  Sign up information is provided on this After Visit Summary.  MyChart is used to connect with patients for Virtual Visits (Telemedicine).  Patients are able to view lab/test results, encounter notes, upcoming appointments, etc.  Non-urgent messages can be sent to your provider as well.   To learn more about what you can do with MyChart, go to [email or web address].    Your next appointment:   12 month(s)  The format for your next appointment:   In Person  Provider:   Shelva Majestic, MD   Other Instructions None

## 2020-04-23 NOTE — Progress Notes (Signed)
Cardiology Office Note    Date:  04/26/2020   ID:  Victoria [name], Victoria [name] 1945-06-01, MRN [id number]  PCP:  Janith Lima, MD  Cardiologist:  Shelva Majestic, MD   F/U cardiology evaluation referred through the courtesy of [place] for evaluation of coronary atherosclerosis  History of Present Illness:  Victoria [name] is a 74 y.o. female who was referred by Dr. Jana Hakim after her a chest CT suggested multivessel CAD.  I saw her for initial evaluation in September 2019 and last evaluated her in a telemedicine visit in June 2020.  She presents for an 57-monthfollow-up evaluation.  Victoria [name] a long-standing history of smoking for 50 years but fortunately quit smoking in 2012.  He has a history of hypertension and I may have seen her over 20 years ago on one occasion.  In 2010, she was seen by Dr. ETina Griffithsreturn ECG was interpreted as abnormal and suggested evidence for old septal infarct and incomplete right bundle branch block.  An echo Doppler study showed mild to moderate LVH, mild to moderate pulmonary hypertension and aortic and mitral sclerosis without stenosis.  A myocardial perfusion study did not show evidence of ischemia.  She has a history of severe COPD, obesity, and sleep apnea on CPAP therapy followed by Dr. RChase Caller  He has a history of breast CA, and is status post lumpectomy followed by Dr. MJana Hakim  She recently underwent a chest CT for lung screening surveillance which did not show evidence for lung cancer.  However, there was a suggestion of aortic and coronary atherosclerosis including calcified plaque in the left main, LAD, circumflex and RCA.  When I saw her for initial evaluation in September 2019, she denied any anginal type symptoms.  Her shortness of breath was felt to be contributed by her COPD.  During that evaluation I recommended that she undergo an echo Doppler study to evaluate both systolic and diastolic function as well as PA pressure.  I scheduled  her for CT coronary angios to evaluate the extent of her coronary atherosclerosis and if moderate stenosis identified FFR.  I also recommended follow-up laboratory.  Her blood pressure was stable on her regimen of amlodipine 10 mg and hydrochlorothiazide.  She was on DuoNeb, [place] been started on Trelegy inhaler for COPD.  Since I saw her for initial evaluation, she has been undergoing evaluation with Dr. RChase Caller Once COVID-19 endemic occurred it was advised that she stay at home rather than working on site.    She was evaluated by me in a telemedicine visit in June 2020.  Apparently she never had the echo or CT angios studies done.  When I saw her initially in light of her LDL cholesterol and evidence for atherosclerosis I recommended further titration of atorvastatin to 80 mg.  She has not had any follow-up lab work.  She tells me she underwent an ultrasound of her thyroid in May 2019 with Dr. JLynford Humphrey  She has a history of multinodular goiter with at least 11 nodules which apparently are relatively stable.   She underwent an echo Doppler study on November 10, 2018 which showed normal LV systolic function with EF greater than 65% with mild diastolic dysfunction, moderate left ventricular hypertrophy, aortic valve sclerosis, and mitral annular calcification with trivial MR.  Subsequently, she has been seen by JColetta Memos NP in September 2020 and more recently by KRoby Loftsin March 2021.  She has been felt to be stable from a cardiovascular standpoint.  She had not been consistent with CPAP use which has been followed by Dr. Chase Caller.  She has not had anginal symptoms.  At her March 2021 evaluation her blood pressure was elevated and carvedilol 3.125 mg twice a day was recommended and she was advised to continue her amlodipine and spironolactone.  She continued to be on atorvastatin for hyperlipidemia with LDL at 66.  Presently, she has felt well.  She had undergone radiation capsule treatment  for overactive thyroid.  She denies anginal symptoms.  She does admit to shortness of breath if she is rushing or walking fast.  She has been on home oxygen for the last 3 years.  She currently is on amlodipine 10 mg, carvedilol 3.125 mg twice a day and spironolactone 25 mg daily for hypertension.  She continues to be on atorvastatin 80 mg for hyperlipidemia.  She is followed by Dr. Chase Caller for her lung disease as well as Dr. Herbert Moors for her thyroid disease with most recent free T4 0.68 and TSH 2.2 on April 18, 2020.  She presents for evaluation.  Past Medical History:  Diagnosis Date  . Abdominal aneurysm ([place])    Dr. Oneida Alar follows lLOV 2 ''17 per pt "around 2 cm"  . Anemia    as a child  . Arthritis    left ankle, right knee, right SI joint, wrists, lower back  . Breast cancer in female [facility])    Left  . COPD (chronic obstructive pulmonary disease) ([place])    ephysema-Dr. Chase Caller  . Dyspnea   . Headache    as a child would have terrible headaches during season changes  . 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 Hyperlipidemia   . Hypertension   . Multiple thyroid nodules   . Murmur, cardiac 1950  . Osteoporosis   . Pneumonia   . Pre-diabetes   . Requires continuous at home supplemental oxygen    2 L 24/7  . Sebaceous cyst    hairline sebaceous cyst left posterior neck to be excised 04-13-16 by Dr. Harlow Asa in [facility]  . Sleep apnea    cpap used sometimes, uses oxygen concentrator 2 l/m nasally bedtime  . Varicella as child    Past Surgical History:  Procedure Laterality Date  . APPENDECTOMY     2008  . BREAST LUMPECTOMY Left 08/31/2017  . BREAST LUMPECTOMY WITH RADIOACTIVE SEED LOCALIZATION Left 08/10/2017   Procedure: BREAST LUMPECTOMY WITH RADIOACTIVE SEED LOCALIZATION;  Surgeon: Rolm Bookbinder, MD;  Location: [place];  Service: General;  Laterality: Left;  . [place]  . COLONOSCOPY    . COLONOSCOPY WITH PROPOFOL N/A 04/16/2016    Procedure: COLONOSCOPY WITH PROPOFOL;  Surgeon: Carol Ada, MD;  Location: [facility];  Service: Endoscopy;  Laterality: N/A;  . CYST REMOVAL NECK Left 04/13/2016   Procedure: EXCISION OF SEBACEOUS CYST LEFT POSTERIOR NECK;  Surgeon: Armandina Gemma, MD;  Location: [place];  Service: General;  Laterality: Left;  . Excision of Pelvic Absess, Right Ovary     2008  . RE-EXCISION OF BREAST CANCER,SUPERIOR MARGINS Left 08/31/2017   Procedure: RE-EXCISION OF LEFT  BREAST MARGINS ERAS PATHWAY;  Surgeon: Rolm Bookbinder, MD;  Location: [place];  Service: General;  Laterality: Left;  . TUBAL LIGATION  1980    Current Medications: Outpatient Medications Prior to Visit  Medication Sig Dispense Refill  . Accu-Chek Softclix Lancets lancets USE 1  TO CHECK GLUCOSE ONCE DAILY 100  each 0  . acetaminophen (TYLENOL) 500 MG tablet Take 1,000 mg by mouth every 6 (six) hours as needed.    . Albuterol Sulfate (PROAIR RESPICLICK) 048 (90 Base) MCG/ACT AEPB Inhale 1-2 puffs into the lungs every 6 (six) hours as needed (for wheezing/shortness of breath). 1 each 5  . amLODipine (NORVASC) 10 MG tablet Take 1 tablet (10 mg total) by mouth daily. 90 tablet 1  . anastrozole (ARIMIDEX) 1 MG tablet Take 1 tablet by mouth once daily 90 tablet 0  . aspirin EC 81 MG tablet Take 1 tablet (81 mg total) by mouth daily. 90 tablet 3  . atorvastatin (LIPITOR) 80 MG tablet Take 1 tablet (80 mg total) by mouth daily at 6 PM. 90 tablet 0  . Blood Glucose Monitoring Suppl (ACCU-CHEK AVIVA PLUS) w/Device KIT 1 Device by Does not apply route daily. Use to monitor glucose levels once per day; E11.9 1 kit 0  . Cholecalciferol (VITAMIN D) 125 MCG (5000 UT) CAPS Take 1 capsule by mouth daily.    [name] denosumab (PROLIA) 60 MG/ML SOLN injection Inject 60 mg into the skin every 6 (six) months. Administer in upper arm, thigh, or abdomen    . Fluticasone-Umeclidin-Vilant (TRELEGY ELLIPTA) 100-62.5-25 MCG/INH AEPB INHALE 1 DOSE INTO THE LUNGS DAILY. 60  each 5  . glucose blood (ACCU-CHEK GUIDE) test strip USE 1 STRIP ONCE DAILY 100 each 0  . glucose blood test strip USE 1 STRIP ONCE DAILY 100 each 12  . OXYGEN Inhale 2-3 L into the lungs continuous. When exerting self     . potassium chloride SA (KLOR-CON) 20 MEQ tablet Take 1 tablet (20 mEq total) by mouth 2 (two) times daily. 180 tablet 0  . roflumilast (DALIRESP) 500 MCG TABS tablet Take 1 tablet (500 mcg total) by mouth daily. 90 tablet 1  . SitaGLIPtin-MetFORMIN HCl (JANUMET XR) 50-1000 MG TB24 Take 2 tablets by mouth daily. 180 tablet 3  . spironolactone (ALDACTONE) 25 MG tablet Take 1 tablet (25 mg total) by mouth daily. 90 tablet 0  . thiamine (VITAMIN B-1) 100 MG tablet Take 1 tablet (100 mg total) by mouth every other day. 45 tablet 1  . carvedilol (COREG) 3.125 MG tablet Take 1 tablet (3.125 mg total) by mouth 2 (two) times daily. 180 tablet 0   No facility-administered medications prior to visit.     Allergies:   Benicar [olmesartan], [name] [valsartan], Hydrocodone-acetaminophen, Lisinopril, Monosodium glutamate, Codeine, Lead acetate, and Nickel   Social History   Socioeconomic History  . Marital status: Divorced    Spouse name: Not on file  . Number of children: 1  . Years of education: 61  . Highest education level: Not on file  Occupational History  . Occupation: Social worker, Technical sales engineer: [employer]  Tobacco Use  . Smoking status: Former Smoker    Packs/day: 1.00    Years: 50.00    Pack years: 50.00    Types: Cigarettes    Quit date: 04/16/2011    Years since quitting: 9.0  . Smokeless tobacco: Never Used  . Tobacco comment: quit that date when she had to go to ER   Vaping Use  . Vaping Use: Never used  Substance and Sexual Activity  . Alcohol use: Yes    Alcohol/week: 0.0 standard drinks    Comment: rare occasion  . Drug use: No    Comment: no marijuana since 2012  . Sexual activity: Not Currently  Other Topics Concern  . Not  on file   Social History Narrative   HSG; [name], [employer]. . Married '69 - 9 yrs/divorced. 1 son - '72; no grandchildren.   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 No h/o physical or sexual abuse. ACP - no living will - wants information. Provided packet of information. On 08/05/2011: she stated she was distant cousins to celebrities [name] and [name]      Social Determinants of Health   Financial Resource Strain: Low Risk   . Difficulty of Paying Living Expenses: Not hard at all  Food Insecurity: No Food Insecurity  . Worried About Charity fundraiser in the Last Year: Never true  . Ran Out of Food in the Last Year: Never true  Transportation Needs: No Transportation Needs  . Lack of Transportation (Medical): No  . Lack of Transportation (Non-Medical): No  Physical Activity: Sufficiently Active  . Days of Exercise per Week: 5 days  . Minutes of Exercise per Session: 30 min  Stress: No Stress Concern Present  . Feeling of Stress : Not at all  Social Connections: Socially Integrated  . Frequency of Communication with Friends and Family: More than three times a week  . Frequency of Social Gatherings with Friends and Family: More than three times a week  . Attends Religious Services: More than 4 times per year  . Active Member of Clubs or Organizations: Yes  . Attends Archivist Meetings: More than 4 times per year  . Marital Status: Married     Family History:  The patient's family history includes Alzheimer's disease in her brother and father; Breast cancer in her paternal grandmother; Diabetes in her brother; Heart disease in her mother; Hyperlipidemia in her brother and brother; Hypertension in her brother, brother, and mother; Stroke (age of onset: 68) in her father.   ROS General: Negative; No fevers, chills, or night sweats; obesity HEENT: Negative; No changes in vision or hearing, sinus congestion,  difficulty swallowing Pulmonary: Severe COPD Cardiovascular: Negative; No chest pain, presyncope, syncope, palpitations GI: Negative; No nausea, vomiting, diarrhea, or abdominal pain GU: Negative; No dysuria, hematuria, or difficulty voiding Musculoskeletal: Negative; no myalgias, joint pain, or weakness Hematologic/Oncology: Negative; no easy bruising, bleeding Endocrine: Positive for diabetes mellitus as well as thyroid disease Neuro: Negative; no changes in balance, headaches Skin: Negative; No rashes or skin lesions Psychiatric: Negative; No behavioral problems, depression Sleep: Sleep apnea on CPAP therapy; No residual snoring, daytime sleepiness, hypersomnolence, bruxism, restless legs, hypnogognic hallucinations, no cataplexy Other comprehensive 14 point system review is negative.   PHYSICAL EXAM:   VS:  BP (!) 170/90   Pulse 77   Ht 5' 4.5" (1.638 m)   Wt 217 lb (98.4 kg)   SpO2 94%   BMI 36.67 kg/m     Repeat blood pressure by me 148-150/80  Wt Readings from Last 3 Encounters:  04/23/20 217 lb (98.4 kg)  04/18/20 215 lb (97.5 kg)  03/31/20 211 lb 14.4 oz (96.1 kg)      Physical Exam BP (!) 170/90   Pulse 77   Ht 5' 4.5" (1.638 m)   Wt 217 lb (98.4 kg)   SpO2 94%   BMI 36.67 kg/m  General: Alert, oriented, no distress.  Skin: normal turgor, no rashes, warm and dry HEENT: Normocephalic, atraumatic. Pupils equal round and reactive to light; sclera anicteric; extraocular muscles intact;  Nose without nasal septal hypertrophy Mouth/Parynx benign; Mallinpatti scale 3 Neck: No JVD, no carotid bruits;  normal carotid upstroke Lungs: clear to ausculatation and percussion; no wheezing or rales Chest wall: without tenderness to palpitation Heart: PMI not displaced, RRR, s1 s2 normal, 1/6 systolic murmur, no diastolic murmur, no rubs, gallops, thrills, or heaves Abdomen: soft, nontender; no hepatosplenomehaly, BS+; abdominal aorta nontender and not dilated by  palpation. Back: no CVA tenderness Pulses 2+ Musculoskeletal: full range of motion, normal strength, no joint deformities Extremities: no clubbing cyanosis or edema, Homan's sign negative  Neurologic: grossly nonfocal; Cranial nerves grossly wnl Psychologic: Normal mood and affect   Studies/Labs Reviewed:   EKG:  EKG is  ordered today.  ECG (independently read by me):  NSR at 77; PRWP, no ectopy, normal intervals     September 2019 ECG (independently read by me): Normal sinus rhythm at 84 bpm.  QS V1 V2.  No ST segment changes.  Normal intervals.  Recent Labs: BMP Latest Ref Rng & Units 12/24/2019 09/19/2019 06/20/2019  Glucose 65 - 99 mg/dL 137(H) 126(H) 112(H)  BUN 7 - 25 mg/dL '13 14 20  ' Creatinine 0.60 - 0.93 mg/dL 0.74 0.77 0.74  BUN/Creat Ratio 6 - 22 (calc) NOT APPLICABLE - -  Sodium 299 - 146 mmol/L 138 142 137  Potassium 3.5 - 5.3 mmol/L 4.4 4.4 4.2  Chloride 98 - 110 mmol/L 101 102 102  CO2 20 - 32 mmol/L 32 30 30  Calcium 8.6 - 10.4 mg/dL 9.7 9.8 9.9     Hepatic Function Latest Ref Rng & Units 09/19/2019 03/21/2019 01/08/2019  Total Protein 6.5 - 8.1 g/dL 6.9 7.1 6.6  Albumin 3.5 - 5.0 g/dL 3.3(L) 3.8 4.1  AST 15 - 41 U/L 10(L) 13(L) 12  ALT 0 - 44 U/L <'6 8 8  ' Alk Phosphatase 38 - 126 U/L 92 84 88  Total Bilirubin 0.3 - 1.2 mg/dL 0.3 0.4 0.3  Bilirubin, Direct 0.0 - 0.3 mg/dL - - -    CBC Latest Ref Rng & Units 12/24/2019 09/19/2019 06/20/2019  WBC 3.8 - 10.8 Thousand/uL 8.3 9.4 9.3  Hemoglobin 11.7 - 15.5 g/dL 11.7 11.4(L) 11.9(L)  Hematocrit 35.0 - 45.0 % 35.5 35.0(L) 36.5  Platelets 140 - 400 Thousand/uL 247 228 246.0   Lab Results  Component Value Date   MCV 85.7 12/24/2019   MCV 86.0 09/19/2019   MCV 83.8 06/20/2019   Lab Results  Component Value Date   TSH 2.20 04/18/2020   Lab Results  Component Value Date   HGBA1C 6.8 (A) 04/18/2020     BNP No results found for: BNP  ProBNP No results found for: PROBNP   Lipid Panel     Component Value Date/Time    CHOL 170 12/24/2019 1420   CHOL 149 01/08/2019 1025   TRIG 201 (H) 12/24/2019 1420   HDL 48 (L) 12/24/2019 1420   HDL 53 01/08/2019 1025   CHOLHDL 3.5 12/24/2019 1420   VLDL 20.4 01/13/2017 1646   LDLCALC 92 12/24/2019 1420   LDLDIRECT 121.0 09/05/2014 1649     RADIOLOGY: No results found.   Additional studies/ records that were reviewed today include:  I reviewed the CT scans, and records of Dr. Jana Hakim and Chase Caller   ASSESSMENT:    1. Coronary artery disease involving native coronary artery of native heart without angina pectoris   2. Coronary artery calcification seen on CT scan   3. Aortic atherosclerosis ([place])   4. Hyperlipidemia with target LDL less than 100   5. Essential hypertension   6. COPD, severe ([place])   7. Type  2 diabetes mellitus with complication, without long-term current use of insulin ([place])   8. OSA (obstructive sleep apnea)   9. History of breast cancer     PLAN:  Victoria [name] is a 74 year old African-American female with advanced COPD, obesity, sleep apnea, and chronic hypoxemic respiratory issues.  She had smoked for 50 years but fortunately quit in 2012.  She has a history of breast CA and is status post lumpectomy and is followed by Dr. Jana Hakim.  Her CT scan in 2019 suggested  aortic as well as coronary atherosclerosis with plaque involving all of her coronary vessels.  At the time she denied any chest pain.  She has been on supplemental oxygen for over 3 years.  An echo Doppler study has shown an EF greater than 55%.  She had mild diastolic dysfunction, increased left atrial pressure, moderate LVH with aortic valve sclerosis and mitral annular calcification trivial MR.  She is status post radiation capsule treatment for overactive thyroid.  Her blood pressure today is elevated despite taking amlodipine 10 mg, spironolactone 25 mg and carvedilol 3.125 mg twice a day.  I have recommended further titration of carvedilol to 6.25 mg twice a day.  He is now on  lipid-lowering therapy in attempt to induce plaque regression with target LDL less than 70.  She has tolerated atorvastatin 80 mg daily.  She is on Trelegy Ellipta, as needed albuterol, and is followed by Dr. Chase Caller for her lung disease and obstructive sleep apnea on CPAP therapy.  She is diabetic on Janumet XR.  She will be following up with with our pulmonary, as well as Dr. Loanne Drilling for endocrinology and Dr. Jana Hakim in follow-up of her history of breast cancer.   Medication Adjustments/Labs and Tests Ordered: Current medicines are reviewed at length with the patient today.  Concerns regarding medicines are outlined above.  Medication changes, Labs and Tests ordered today are listed in the Patient Instructions below. Patient Instructions  Medication Instructions:  INCREASE your carvedilol (Coreg) to 6.25 mg twice a day *If you need a refill on your cardiac medications before your next appointment, please call your pharmacy*   Lab Work: None ordered If you have labs (blood work) drawn today and your tests are completely normal, you will receive your results only by: [name] MyChart Message (if you have MyChart) OR . A paper copy in the mail If you have any lab test that is abnormal or we need to change your treatment, we will call you to review the results.   Testing/Procedures: None ordered   Follow-Up: At [facility], you and your health needs are our priority.  As part of our continuing mission to provide you with exceptional heart care, we have created designated Provider Care Teams.  These Care Teams include your primary Cardiologist (physician) and Advanced Practice Providers (APPs -  Physician Assistants and Nurse Practitioners) who all work together to provide you with the care you need, when you need it.  We recommend signing up for the patient portal called "MyChart".  Sign up information is provided on this After Visit Summary.  MyChart is used to connect with patients for  Virtual Visits (Telemedicine).  Patients are able to view lab/test results, encounter notes, upcoming appointments, etc.  Non-urgent messages can be sent to your provider as well.   To learn more about what you can do with MyChart, go to [email or web address].    Your next appointment:   12 month(s)  The format for your next appointment:  In Person  Provider:   Shelva Majestic, MD   Other Instructions None     Signed, Shelva Majestic, MD  04/26/2020 4:46 PM    [facility] [address], [place], [place], [place]  [address] Phone: [phone]

## 2020-04-26 ENCOUNTER — Encounter: Payer: Self-pay | Admitting: Cardiovascular Disease

## 2020-04-30 ENCOUNTER — Encounter: Payer: Self-pay | Admitting: Adult Health

## 2020-04-30 ENCOUNTER — Other Ambulatory Visit: Payer: Self-pay

## 2020-04-30 ENCOUNTER — Ambulatory Visit: Payer: Medicare Other | Admitting: Adult Health

## 2020-04-30 VITALS — BP 126/58 | HR 89 | Temp 97.4°F | Ht 64.5 in | Wt 215.0 lb

## 2020-04-30 DIAGNOSIS — J9611 Chronic respiratory failure with hypoxia: Secondary | ICD-10-CM | POA: Diagnosis not present

## 2020-04-30 DIAGNOSIS — J449 Chronic obstructive pulmonary disease, unspecified: Secondary | ICD-10-CM

## 2020-04-30 DIAGNOSIS — Z23 Encounter for immunization: Secondary | ICD-10-CM

## 2020-04-30 DIAGNOSIS — R918 Other nonspecific abnormal finding of lung field: Secondary | ICD-10-CM

## 2020-04-30 MED ORDER — ALBUTEROL SULFATE (2.5 MG/3ML) 0.083% IN NEBU: 2.5000 mg | INHALATION_SOLUTION | Freq: Four times a day (QID) | RESPIRATORY_TRACT | 3 refills | Status: DC | PRN

## 2020-04-30 NOTE — Assessment & Plan Note (Signed)
Lung nodules noted on LDCT 12/2019 , has planned repeat CT in Feb 2022 .

## 2020-04-30 NOTE — Patient Instructions (Addendum)
Continue on TRELEGY 1 puff daily , rinse after use.  Continue on Daliresp daily  Activity as tolerated.  Albuterol inhaler or neb As needed   Continue oxygen 2l/m . (3l/m on portable system)  LDCT Chest as planned  Work on healthy weight.  Do not drive if sleepy .  Flu shot  Follow up with Dr. Chase Caller in 4-6  months and As needed

## 2020-04-30 NOTE — Progress Notes (Signed)
_0  ID: Victoria [name], female    DOB: October 21, 1945, 74 y.o.   MRN: [id number]  Chief Complaint  Patient presents with  . Follow-up  . COPD    Referring provider: Janith Lima, MD  HPI: 74 year old female former smoker followed for severe gold 3 COPD and oxygen pendant respiratory failure, obstructive sleep apnea nocturnal CPAP Breast cancer history diagnosed in 2019 status post lumpectomy.  She did not receive chemo or radiation therapy. On Arimidex.   TEST/EVENTS :  Pulmonary function test June 2017: stage III COPD FEV1 0.88 L/47% in 10/21/2015 and 0.69 L/37% later in June 2017 CT chest lung cancer screening 10/09/2015: Emphysema with very small lung nodules Echo January 2016: Shows grade 1 diastolic dysfunction Nuclear medicine cardiac stress to 12/11/2015: Normal tests Lexiscan Myoview with ejection fraction 75% PSG 12/2014 >AHI 35/h 12/2018 Lung-RADS 2, benign appearance or behavior. Continue annual screening with low-dose chest CT without contrast in 12 months.  04/30/2020 Follow up : COPD , O2 RF , OSA  Patient presents for a 1-monthfollow-up.  Patient has underlying severe COPD.  She remains on Trelegy daily and Daliresp daily .  Says overall breathing is doing okay. Gets winded with activity . No flare of cough or wheezing  Needs new neb machine and refills of albuterol neb. Machine is too old.   Patient participates in the low-dose CT screening program.  CT chest on January 11, 2020 showed a lung RADS 3.  She had multiple small nodules measuring up to 5.9 mm.  She has been planned for a follow-up CT chest in 6 months.  She has chronic respiratory failure is on oxygen 3 L on her portable system at 2 L at home.  Says she is had no increased oxygen demands.  She gets winded with minimum activity.  No change in her activity tolerance  She remains busy.  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   She does have sleep apnea but is CPAP intolerant.  Covid vaccines are up-to-date. Needs booster and flu shot .   Allergies  Allergen Reactions  . Benicar [Olmesartan] Swelling    Swelling of face and arms   . [name] [Valsartan] Swelling    Swelling of face and arms   . Hydrocodone-Acetaminophen Nausea And Vomiting    Severe vomiting  . Lisinopril Cough  . Monosodium Glutamate Other (See Comments)    Facial swelling per pt  . Codeine Other (See Comments)    jittery  . Lead Acetate Rash  . Nickel Rash    Severe rash to infection: pt is allergic to all metals other than sterling silver or gold jewelry.     Immunization History  Administered Date(s) Administered  . Fluad Quad(high Dose 65+) 01/17/2019  . Influenza Split 01/16/2011  . Influenza, High Dose Seasonal PF 01/13/2017, 02/09/2018  . Influenza,inj,Quad PF,6+ Mos 02/01/2013, 02/27/2014, 04/07/2015, 01/08/2016  . PFIZER SARS-COV-2 Vaccination 06/06/2019, 06/27/2019  . Pneumococcal Conjugate-13 02/01/2013  . Pneumococcal Polysaccharide-23 10/29/2014  . Td 12/06/2011    Past Medical History:  Diagnosis Date  . Abdominal aneurysm ([facility]    Dr. FOneida Alarfollows lLOV 2 ''17 per pt "around 2 cm"  . Anemia    as a child  . Arthritis    left ankle, right knee, right SI joint, wrists, lower back  . Breast cancer in female ([facility]    Left  . COPD (chronic obstructive  pulmonary disease) ([place])    ephysema-Dr. Chase Caller  . Dyspnea   . Headache    as a child would have terrible headaches during season changes  . 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 Hyperlipidemia   . Hypertension   . Multiple thyroid nodules   . Murmur, cardiac 1950  . Osteoporosis   . Pneumonia   . Pre-diabetes   . Requires continuous at home supplemental oxygen    2 L 24/7  . Sebaceous cyst    hairline sebaceous cyst left posterior neck to be excised 04-13-16 by Dr. Harlow Asa in [facility]  . Sleep apnea    cpap used  sometimes, uses oxygen concentrator 2 l/m nasally bedtime  . Varicella as child    Tobacco History: Social History   Tobacco Use  Smoking Status Former Smoker  . Packs/day: 1.00  . Years: 50.00  . Pack years: 50.00  . Types: Cigarettes  . Quit date: 04/16/2011  . Years since quitting: 9.0  Smokeless Tobacco Never Used  Tobacco Comment   quit that date when she had to go to ER    Counseling given: Not Answered Comment: quit that date when she had to go to ER    Outpatient Medications Prior to Visit  Medication Sig Dispense Refill  . Accu-Chek Softclix Lancets lancets USE 1  TO CHECK GLUCOSE ONCE DAILY 100 each 0  . acetaminophen (TYLENOL) 500 MG tablet Take 1,000 mg by mouth every 6 (six) hours as needed.    . Albuterol Sulfate (PROAIR RESPICLICK) 767 (90 Base) MCG/ACT AEPB Inhale 1-2 puffs into the lungs every 6 (six) hours as needed (for wheezing/shortness of breath). 1 each 5  . amLODipine (NORVASC) 10 MG tablet Take 1 tablet (10 mg total) by mouth daily. 90 tablet 1  . anastrozole (ARIMIDEX) 1 MG tablet Take 1 tablet by mouth once daily 90 tablet 0  . aspirin EC 81 MG tablet Take 1 tablet (81 mg total) by mouth daily. 90 tablet 3  . atorvastatin (LIPITOR) 80 MG tablet Take 1 tablet (80 mg total) by mouth daily at 6 PM. 90 tablet 0  . Blood Glucose Monitoring Suppl (ACCU-CHEK AVIVA PLUS) w/Device KIT 1 Device by Does not apply route daily. Use to monitor glucose levels once per day; E11.9 1 kit 0  . carvedilol (COREG) 6.25 MG tablet Take 1 tablet (6.25 mg total) by mouth 2 (two) times daily. 180 tablet 3  . Cholecalciferol (VITAMIN D) 125 MCG (5000 UT) CAPS Take 1 capsule by mouth daily.    [name] denosumab (PROLIA) 60 MG/ML SOLN injection Inject 60 mg into the skin every 6 (six) months. Administer in upper arm, thigh, or abdomen    . Fluticasone-Umeclidin-Vilant (TRELEGY ELLIPTA) 100-62.5-25 MCG/INH AEPB INHALE 1 DOSE INTO THE LUNGS DAILY. 60 each 5  . glucose blood (ACCU-CHEK  GUIDE) test strip USE 1 STRIP ONCE DAILY 100 each 0  . glucose blood test strip USE 1 STRIP ONCE DAILY 100 each 12  . OXYGEN Inhale 2-3 L into the lungs continuous. When exerting self    . potassium chloride SA (KLOR-CON) 20 MEQ tablet Take 1 tablet (20 mEq total) by mouth 2 (two) times daily. 180 tablet 0  . roflumilast (DALIRESP) 500 MCG TABS tablet Take 1 tablet (500 mcg total) by mouth daily. 90 tablet 1  . SitaGLIPtin-MetFORMIN HCl (JANUMET XR) 50-1000 MG TB24 Take 2 tablets by mouth daily. 180 tablet 3  . spironolactone (ALDACTONE) 25  MG tablet Take 1 tablet (25 mg total) by mouth daily. 90 tablet 0  . thiamine (VITAMIN B-1) 100 MG tablet Take 1 tablet (100 mg total) by mouth every other day. 45 tablet 1   No facility-administered medications prior to visit.     Review of Systems:   Constitutional:   No  weight loss, night sweats,  Fevers, chills, fatigue, or  lassitude.  HEENT:   No headaches,  Difficulty swallowing,  Tooth/dental problems, or  Sore throat,                No sneezing, itching, ear ache, nasal congestion, post nasal drip,   CV:  No chest pain,  Orthopnea, PND, swelling in lower extremities, anasarca, dizziness, palpitations, syncope.   GI  No heartburn, indigestion, abdominal pain, nausea, vomiting, diarrhea, change in bowel habits, loss of appetite, bloody stools.   Resp: .  No chest wall deformity  Skin: no rash or lesions.  GU: no dysuria, change in color of urine, no urgency or frequency.  No flank pain, no hematuria   MS:  No joint pain or swelling.  No decreased range of motion.  No back pain.    Physical Exam  BP (!) 126/58 (BP Location: Left Arm, Cuff Size: Normal)   Pulse 89   Temp (!) 97.4 F (36.3 C) (Temporal)   Ht 5' 4.5" (1.638 m)   Wt 215 lb (97.5 kg)   SpO2 97%   BMI 36.33 kg/m   GEN: A/Ox3; pleasant , NAD, well nourished    HEENT:  [place]/AT,  NOSE-clear, THROAT-clear, no lesions, no postnasal drip or exudate noted.   NECK:  Supple  w/ fair ROM; no JVD; normal carotid impulses w/o bruits; no thyromegaly or nodules palpated; no lymphadenopathy.    RESP  Clear  P & A; w/o, wheezes/ rales/ or rhonchi. no accessory muscle use, no dullness to percussion  CARD:  RRR, no m/r/g, no peripheral edema, pulses intact, no cyanosis or clubbing.  GI:   Soft & nt; nml bowel sounds; no organomegaly or masses detected.   Musco: Warm bil, no deformities or joint swelling noted.   Neuro: alert, no focal deficits noted.    Skin: Warm, no lesions or rashes    Lab Results:  CBC    Component Value Date/Time   WBC 8.3 12/24/2019 1420   RBC 4.14 12/24/2019 1420   HGB 11.7 12/24/2019 1420   HGB 13.4 12/11/2018 1403   HGB 13.1 03/13/2018 1539   HCT 35.5 12/24/2019 1420   HCT 38.7 03/13/2018 1539   PLT 247 12/24/2019 1420   PLT 285 12/11/2018 1403   PLT 271 03/13/2018 1539   MCV 85.7 12/24/2019 1420   MCV 85 03/13/2018 1539   MCH 28.3 12/24/2019 1420   MCHC 33.0 12/24/2019 1420   RDW 13.8 12/24/2019 1420   RDW 13.5 03/13/2018 1539   LYMPHSABS 1,569 12/24/2019 1420   MONOABS 0.7 09/19/2019 1110   EOSABS 183 12/24/2019 1420   BASOSABS 42 12/24/2019 1420    BMET    Component Value Date/Time   NA 138 12/24/2019 1420   NA 139 01/08/2019 1025   K 4.4 12/24/2019 1420   CL 101 12/24/2019 1420   CO2 32 12/24/2019 1420   GLUCOSE 137 (H) 12/24/2019 1420   BUN 13 12/24/2019 1420   BUN 15 01/08/2019 1025   CREATININE 0.74 12/24/2019 1420   CALCIUM 9.7 12/24/2019 1420   GFRNONAA 79 12/24/2019 1420   GFRAA 92 12/24/2019 1420  BNP No results found for: BNP  ProBNP No results found for: PROBNP  Imaging: No results found.    PFT Results Latest Ref Rng & Units 04/15/2016 11/05/2015 10/21/2015  FVC-Pre L 1.41 1.50 1.66  FVC-Predicted Pre % 58 63 69  FVC-Post L 1.38 1.50 1.83  FVC-Predicted Post % 57 63 77  Pre FEV1/FVC % % 45 41 44  Post FEV1/FCV % % 47 46 48  FEV1-Pre L 0.64 0.62 0.73  FEV1-Predicted Pre % 34 33 39   FEV1-Post L 0.65 0.69 0.88  DLCO uncorrected ml/min/mmHg 9.45 - -  DLCO UNC% % 36 - -  DLCO corrected ml/min/mmHg 9.47 - -  DLCO COR %Predicted % 37 - -  DLVA Predicted % 60 - -    No results found for: NITRICOXIDE      Assessment & Plan:   COPD, severe (HCC) Stable on current regimen   Plan  Patient Instructions  Continue on TRELEGY 1 puff daily , rinse after use.  Continue on Daliresp daily  Activity as tolerated.  Continue oxygen 2l/m . (3l/m on portable system)  LDCT Chest as planned  Work on healthy weight.  Do not drive if sleepy .  Flu shot  Follow up with Dr. Chase Caller in 4-6  months and As needed         Chronic respiratory failure with hypoxia ([place]) Continue on Oxygen 2l/m and 3l/m portable system.   Lung nodules Lung nodules noted on LDCT 12/2019 , has planned repeat CT in Feb 2022 .       Rexene Edison, NP 04/30/2020

## 2020-04-30 NOTE — Assessment & Plan Note (Signed)
Stable on current regimen   Plan  Patient Instructions  Continue on TRELEGY 1 puff daily , rinse after use.  Continue on Daliresp daily  Activity as tolerated.  Continue oxygen 2l/m . (3l/m on portable system)  LDCT Chest as planned  Work on healthy weight.  Do not drive if sleepy .  Flu shot  Follow up with Dr. Chase Caller in 4-6  months and As needed

## 2020-04-30 NOTE — Assessment & Plan Note (Signed)
Continue on Oxygen 2l/m and 3l/m portable system.

## 2020-05-01 ENCOUNTER — Ambulatory Visit: Payer: Medicare Other

## 2020-05-02 ENCOUNTER — Other Ambulatory Visit: Payer: Self-pay

## 2020-05-02 ENCOUNTER — Ambulatory Visit (INDEPENDENT_AMBULATORY_CARE_PROVIDER_SITE_OTHER): Payer: Medicare Other

## 2020-05-02 DIAGNOSIS — M818 Other osteoporosis without current pathological fracture: Secondary | ICD-10-CM | POA: Diagnosis not present

## 2020-05-02 MED ORDER — DENOSUMAB 60 MG/ML ~~LOC~~ SOSY
60.0000 mg | PREFILLED_SYRINGE | Freq: Once | SUBCUTANEOUS | Status: AC
  Administered 2020-05-02: 60 mg via SUBCUTANEOUS

## 2020-05-05 NOTE — Progress Notes (Signed)
Pt given Prolia injection w/o any complications. 

## 2020-05-13 DIAGNOSIS — J449 Chronic obstructive pulmonary disease, unspecified: Secondary | ICD-10-CM | POA: Diagnosis not present

## 2020-05-14 ENCOUNTER — Telehealth: Payer: Self-pay | Admitting: Adult Health

## 2020-05-14 MED ORDER — ALBUTEROL SULFATE (2.5 MG/3ML) 0.083% IN NEBU: 2.5000 mg | INHALATION_SOLUTION | Freq: Four times a day (QID) | RESPIRATORY_TRACT | 3 refills | Status: DC | PRN

## 2020-05-14 NOTE — Telephone Encounter (Signed)
Called and spoke with pt who stated that her albuterol neb sol is on backorder at her primary pharmacy of [address] off of [address]. Pt said that they were not sure when it would be back in stock.  Pt said that we can try to send Rx to her other pharmacy listed of [facility] so I have done this.  In case it is also on backorder there, pt wanted to know if we might have a backup neb sol that we could send in for pt for her to use as needed until things do get to where she is able to receive the albuterol.  With MR not being available until next week, sending this to TP as she will be in office tomorrow 05/15/20.  Tammy, please advise on this for pt.

## 2020-05-15 NOTE — Telephone Encounter (Signed)
Called [facility] and spoke with Diannia Ruder to see if they had Rx for pt's albuterol sol that was sent in yesterday 12/29. She stated that they received the Rx and are able to get Rx ready for pt. Called and spoke with pt letting her know this and she verbalized understanding. Nothing further needed.

## 2020-05-21 ENCOUNTER — Telehealth: Payer: Self-pay | Admitting: Adult Health

## 2020-05-21 NOTE — Telephone Encounter (Signed)
Primary Pulmonologist: MR Last office visit and with whom: 04/30/20 with TP What do we see them for (pulmonary problems): COPD Last OV assessment/plan:   Plan  Patient Instructions  Continue on TRELEGY 1 puff daily , rinse after use.  Continue on Daliresp daily  Activity as tolerated.  Continue oxygen 2l/m . (3l/m on portable system)  LDCT Chest as planned  Work on healthy weight.  Do not drive if sleepy .  Flu shot  Follow up with Dr. Marchelle Gearing in 4-6  months and As needed      Was appointment offered to patient (explain)?  no   Reason for call: pt is asking if there is alternative to the albuterol for the nebulizer since this is on backorder across the board with all pharmacies.     Allergies  Allergen Reactions  . Benicar [Olmesartan] Swelling    Swelling of face and arms   . [name] [Valsartan] Swelling    Swelling of face and arms   . Hydrocodone-Acetaminophen Nausea And Vomiting    Severe vomiting  . Lisinopril Cough  . Monosodium Glutamate Other (See Comments)    Facial swelling per pt  . Codeine Other (See Comments)    jittery  . Lead Acetate Rash  . Nickel Rash    Severe rash to infection: pt is allergic to all metals other than sterling silver or gold jewelry.     Immunization History  Administered Date(s) Administered  . Fluad Quad(high Dose 65+) 01/17/2019, 04/30/2020  . Influenza Split 01/16/2011  . Influenza, High Dose Seasonal PF 01/13/2017, 02/09/2018  . Influenza,inj,Quad PF,6+ Mos 02/01/2013, 02/27/2014, 04/07/2015, 01/08/2016  . PFIZER SARS-COV-2 Vaccination 06/06/2019, 06/27/2019  . Pneumococcal Conjugate-13 02/01/2013  . Pneumococcal Polysaccharide-23 10/29/2014  . Td 12/06/2011

## 2020-05-22 NOTE — Telephone Encounter (Signed)
Patient checking on med for Nebulizer machine. Patient phone number is [phone].

## 2020-05-22 NOTE — Telephone Encounter (Signed)
Called [facility] to check on status of pt's albuterol nebulizer solution Rx that was placed 05/14/20.  Rx is on file at pt's pharmacy and they do have the med in stock so they can get it shipped out to pt. Stated to them that pt is needing med and they said that they will make a note to get this filled and sent to pt.  Called and spoke with pt letting her know this info and she verbalized understanding. Nothing further needed.

## 2020-05-22 NOTE — Telephone Encounter (Signed)
MR not listed on the schedule.  TP  please advise if we can change the albuterol for this pt since this is on back order.  thanks

## 2020-05-22 NOTE — Telephone Encounter (Signed)
Please check around to other pharmacies DME albuterol is not out everywhere  [place] [facility] has albuterol so see if we can prescribe it at an alternative place please

## 2020-06-11 ENCOUNTER — Telehealth: Payer: Self-pay | Admitting: Adult Health

## 2020-06-11 NOTE — Telephone Encounter (Signed)
Pt stated that she started the albuterol for the nebulizer about 2 weeks ago.  She stated that this is helping with the breathing but her blood sugars and weight have increased.  She stated that her weight has gone up about 5 lbs in the last 2 weeks.  She is wanting to see if there is another medication that she can take to replace the albuterol neb.  TP please advise. Thanks

## 2020-06-12 NOTE — Telephone Encounter (Signed)
Advised that do not believe that albuterol is causing her blood sugars to be elevated.  Have asked her to reach out to her primary care or endocrinologist to discuss her diabetes.  She says she has had recent diabetic changes. Otherwise she says the inhaler and nebulizers are helping her breathing she feels more open and breathing is improved since last visit. Patient is advised to continue current regimen and follow-up with our office as planned and as needed

## 2020-06-13 DIAGNOSIS — J449 Chronic obstructive pulmonary disease, unspecified: Secondary | ICD-10-CM | POA: Diagnosis not present

## 2020-06-25 ENCOUNTER — Telehealth: Payer: Self-pay | Admitting: Pharmacist

## 2020-06-25 NOTE — Progress Notes (Addendum)
Chronic Care Management Pharmacy Assistant   Name: Victoria [name]  MRN: [id number] DOB: [birth year]  Reason for Encounter: Medication Review  Patient Questions:  1.  Have you seen any other providers since your last visit? Yes, patient last seen Dr. Shelva Majestic cardiologist on 04/23/20   2.  Any changes in your medicines or health? Yes, Dr. Claiborne Billings increased her carvedilol to 6.25    PCP : Janith Lima, MD  Allergies:   Allergies  Allergen Reactions   Benicar [Olmesartan] Swelling    Swelling of face and arms    [name] [Valsartan] Swelling    Swelling of face and arms    Hydrocodone-Acetaminophen Nausea And Vomiting    Severe vomiting   Lisinopril Cough   Monosodium Glutamate Other (See Comments)    Facial swelling per pt   Codeine Other (See Comments)    jittery   Lead Acetate Rash   Nickel Rash    Severe rash to infection: pt is allergic to all metals other than sterling silver or gold jewelry.     Medications: Outpatient Encounter Medications as of 06/25/2020  Medication Sig   Accu-Chek Softclix Lancets lancets USE 1  TO CHECK GLUCOSE ONCE DAILY   acetaminophen (TYLENOL) 500 MG tablet Take 1,000 mg by mouth every 6 (six) hours as needed.   albuterol (PROVENTIL) (2.5 MG/3ML) 0.083% nebulizer solution Take 3 mLs (2.5 mg total) by nebulization every 6 (six) hours as needed for wheezing or shortness of breath.   Albuterol Sulfate (PROAIR RESPICLICK) 102 (90 Base) MCG/ACT AEPB Inhale 1-2 puffs into the lungs every 6 (six) hours as needed (for wheezing/shortness of breath).   amLODipine (NORVASC) 10 MG tablet Take 1 tablet (10 mg total) by mouth daily.   anastrozole (ARIMIDEX) 1 MG tablet Take 1 tablet by mouth once daily   aspirin EC 81 MG tablet Take 1 tablet (81 mg total) by mouth daily.   atorvastatin (LIPITOR) 80 MG tablet Take 1 tablet (80 mg total) by mouth daily at 6 PM.   Blood Glucose Monitoring Suppl (ACCU-CHEK AVIVA PLUS) w/Device KIT 1 Device by Does not  apply route daily. Use to monitor glucose levels once per day; E11.9   carvedilol (COREG) 6.25 MG tablet Take 1 tablet (6.25 mg total) by mouth 2 (two) times daily.   Cholecalciferol (VITAMIN D) 125 MCG (5000 UT) CAPS Take 1 capsule by mouth daily.   denosumab (PROLIA) 60 MG/ML SOLN injection Inject 60 mg into the skin every 6 (six) months. Administer in upper arm, thigh, or abdomen   Fluticasone-Umeclidin-Vilant (TRELEGY ELLIPTA) 100-62.5-25 MCG/INH AEPB INHALE 1 DOSE INTO THE LUNGS DAILY.   glucose blood (ACCU-CHEK GUIDE) test strip USE 1 STRIP ONCE DAILY   glucose blood test strip USE 1 STRIP ONCE DAILY   OXYGEN Inhale 2-3 L into the lungs continuous. When exerting self   potassium chloride SA (KLOR-CON) 20 MEQ tablet Take 1 tablet (20 mEq total) by mouth 2 (two) times daily.   roflumilast (DALIRESP) 500 MCG TABS tablet Take 1 tablet (500 mcg total) by mouth daily.   SitaGLIPtin-MetFORMIN HCl (JANUMET XR) 50-1000 MG TB24 Take 2 tablets by mouth daily.   spironolactone (ALDACTONE) 25 MG tablet Take 1 tablet (25 mg total) by mouth daily.   thiamine (VITAMIN B-1) 100 MG tablet Take 1 tablet (100 mg total) by mouth every other day.   No facility-administered encounter medications on file as of 06/25/2020.    Current Diagnosis: Patient Active Problem List  Diagnosis Date Noted   Lung nodules 04/30/2020   Thiamine deficiency 04/24/2019   Aortic atherosclerosis ([place]) 11/23/2017   Malignant neoplasm of upper-outer quadrant of left breast in female, estrogen receptor positive ([place]) 07/01/2017   Chronic respiratory failure with hypoxia ([place]) 02/07/2017   DDD (degenerative disc disease), lumbar 01/13/2017   Estrogen deficiency 10/20/2016   Type II diabetes mellitus with manifestations ([place]) 01/08/2016   Visit for screening mammogram 10/14/2015   Atherosclerosis of native coronary artery of native heart without angina pectoris 10/14/2015   History of positive PPD 10/08/2015   COPD, severe ([place])  09/04/2015   AAA (abdominal aortic aneurysm) without rupture ([place]) 05/13/2015   Multiple thyroid nodules 05/13/2015   OSA (obstructive sleep apnea) 11/15/2014   Essential hypertension 03/11/2014   Emphysema of lung ([place]) 03/11/2014   Osteoporosis 02/11/2014   Routine health maintenance 12/07/2011   DJD (degenerative joint disease) of knee    Hyperlipidemia with target LDL less than 100     Goals Addressed   None     Follow-Up:  Coordination of Enhanced Pharmacy Services   Reviewed chart for medication changes ahead of medication coordination call.  No OVs, Consults, or hospital visits since last care coordination call/Pharmacist visit.  No medication changes indicated   BP Readings from Last 3 Encounters:  04/30/20 (!) 126/58  04/23/20 (!) 170/90  04/18/20 (!) 178/62    Lab Results  Component Value Date   HGBA1C 6.8 (A) 04/18/2020     Patient obtains medications through Vials  30 Days   Last adherence delivery included:  Spironolactone 25 mg 1 tab daily 05/13/20 Atorvastatin 80 mg 1 tab by mouth daily, evening 05/13/20 Amlodipine 10 mg 1 tab by mouth daily 05/09/20 Albuterol sulfate 2.5 mg/22m nebulizer every 6 hours as needed 05/23/20 Trelegy Aer 100 mcg Ellipta inhale one dose into the lungs once daily 04/30/20 Janumet Xr 50-1000 mg take two tabs by mouth once daily   Patient is due for next adherence delivery on: 07/03/20. Called patient and reviewed medications and coordinated delivery.  This delivery to include: Spironolactone 25 mg 1 tab daily  Atorvastatin 80 mg 1 tab by mouth daily, evening  Amlodipine 10 mg 1 tab by mouth daily  Albuterol sulfate 2.5 mg/329mnebulizer every 6 hours as needed  Trelegy Aer 100 mcg Ellipta inhale one dose into the lungs once daily Janumet Xr 50-1000 mg take two tabs by mouth once daily   Confirmed delivery date of 07/03/20, advised patient that pharmacy will contact them the morning of delivery.  TrWendy [phone]

## 2020-07-10 ENCOUNTER — Telehealth: Payer: Self-pay | Admitting: Internal Medicine

## 2020-07-10 NOTE — Telephone Encounter (Signed)
Call returned to patient, confirmed DOB. Patient reports she has had a recent death in the family and she wanted to know whether she should go to the funeral. The funeral is in [place]. The high's temperature wise is 37 degrees for the whole week. She reports she has been vaccinated for covid and she has had her booster shot. She voices that she just does not feel comfortable. Patient voices her and her family is close and she wanted to be there to support them. Mid conversation patient decides she will not go because she thinks this is what is best for her health.   Nothing further needed at this time.

## 2020-07-10 NOTE — Telephone Encounter (Signed)
Patient is returning phone call. Patient phone number is [phone]. 

## 2020-07-10 NOTE — Telephone Encounter (Signed)
Attempted to call pt but unable to reach. Left message for her to return call. 

## 2020-07-14 ENCOUNTER — Ambulatory Visit (INDEPENDENT_AMBULATORY_CARE_PROVIDER_SITE_OTHER)
Admission: RE | Admit: 2020-07-14 | Discharge: 2020-07-14 | Disposition: A | Discharge status: Home / Self Care | Payer: Medicare Other | Source: Ambulatory Visit

## 2020-07-14 ENCOUNTER — Other Ambulatory Visit: Payer: Self-pay

## 2020-07-14 DIAGNOSIS — J984 Other disorders of lung: Secondary | ICD-10-CM | POA: Diagnosis not present

## 2020-07-14 DIAGNOSIS — J449 Chronic obstructive pulmonary disease, unspecified: Secondary | ICD-10-CM | POA: Diagnosis not present

## 2020-07-14 DIAGNOSIS — Z87891 Personal history of nicotine dependence: Secondary | ICD-10-CM | POA: Diagnosis not present

## 2020-07-14 DIAGNOSIS — I2510 Atherosclerotic heart disease of native coronary artery without angina pectoris: Secondary | ICD-10-CM | POA: Diagnosis not present

## 2020-07-14 DIAGNOSIS — J432 Centrilobular emphysema: Secondary | ICD-10-CM | POA: Diagnosis not present

## 2020-07-14 DIAGNOSIS — M47814 Spondylosis without myelopathy or radiculopathy, thoracic region: Secondary | ICD-10-CM | POA: Diagnosis not present

## 2020-07-22 NOTE — Progress Notes (Signed)
Please call patient and let them  know their  low dose Ct was read as a Lung RADS 2: nodules that are benign in appearance and behavior with a very low likelihood of becoming a clinically active cancer due to size or lack of growth. Recommendation per radiology is for a repeat LDCT in 12 months. .Please let them  know we will order and schedule their  annual screening scan for 07/2021. Please let them  know there was notation of CAD on their  scan.  Please remind the patient  that this is a non-gated exam therefore degree or severity of disease  cannot be determined. Please have them  follow up with their PCP regarding potential risk factor modification, dietary therapy or pharmacologic therapy if clinically indicated. Pt.  is  currently on statin therapy. Please place order for annual  screening scan for  07/2021 and fax results to PCP. Thanks so much. 

## 2020-07-24 ENCOUNTER — Other Ambulatory Visit: Payer: Self-pay | Admitting: *Deleted

## 2020-07-24 DIAGNOSIS — Z87891 Personal history of nicotine dependence: Secondary | ICD-10-CM

## 2020-07-31 ENCOUNTER — Telehealth: Payer: Self-pay | Admitting: Pharmacist

## 2020-07-31 NOTE — Progress Notes (Addendum)
Chronic Care Management Pharmacy Assistant   Name: Victoria [name]  MRN: [id number] DOB: 30-Apr-1946   Reason for Encounter: Diabetic Disease State Call   Conditions to be addressed/monitored: DMII   Recent office visits:  NA  Recent consult visits:  [place] [facility] visits:  None in previous 6 months  Medications: Outpatient Encounter Medications as of 07/31/2020  Medication Sig   Accu-Chek Softclix Lancets lancets USE 1  TO CHECK GLUCOSE ONCE DAILY   acetaminophen (TYLENOL) 500 MG tablet Take 1,000 mg by mouth every 6 (six) hours as needed.   albuterol (PROVENTIL) (2.5 MG/3ML) 0.083% nebulizer solution Take 3 mLs (2.5 mg total) by nebulization every 6 (six) hours as needed for wheezing or shortness of breath.   Albuterol Sulfate (PROAIR RESPICLICK) 976 (90 Base) MCG/ACT AEPB Inhale 1-2 puffs into the lungs every 6 (six) hours as needed (for wheezing/shortness of breath).   amLODipine (NORVASC) 10 MG tablet Take 1 tablet (10 mg total) by mouth daily.   anastrozole (ARIMIDEX) 1 MG tablet Take 1 tablet by mouth once daily   aspirin EC 81 MG tablet Take 1 tablet (81 mg total) by mouth daily.   atorvastatin (LIPITOR) 80 MG tablet Take 1 tablet (80 mg total) by mouth daily at 6 PM.   Blood Glucose Monitoring Suppl (ACCU-CHEK AVIVA PLUS) w/Device KIT 1 Device by Does not apply route daily. Use to monitor glucose levels once per day; E11.9   carvedilol (COREG) 6.25 MG tablet Take 1 tablet (6.25 mg total) by mouth 2 (two) times daily.   Cholecalciferol (VITAMIN D) 125 MCG (5000 UT) CAPS Take 1 capsule by mouth daily.   denosumab (PROLIA) 60 MG/ML SOLN injection Inject 60 mg into the skin every 6 (six) months. Administer in upper arm, thigh, or abdomen   Fluticasone-Umeclidin-Vilant (TRELEGY ELLIPTA) 100-62.5-25 MCG/INH AEPB INHALE 1 DOSE INTO THE LUNGS DAILY.   glucose blood (ACCU-CHEK GUIDE) test strip USE 1 STRIP ONCE DAILY   glucose blood test strip USE 1 STRIP ONCE DAILY    OXYGEN Inhale 2-3 L into the lungs continuous. When exerting self   potassium chloride SA (KLOR-CON) 20 MEQ tablet Take 1 tablet (20 mEq total) by mouth 2 (two) times daily.   roflumilast (DALIRESP) 500 MCG TABS tablet Take 1 tablet (500 mcg total) by mouth daily.   SitaGLIPtin-MetFORMIN HCl (JANUMET XR) 50-1000 MG TB24 Take 2 tablets by mouth daily.   spironolactone (ALDACTONE) 25 MG tablet Take 1 tablet (25 mg total) by mouth daily.   thiamine (VITAMIN B-1) 100 MG tablet Take 1 tablet (100 mg total) by mouth every other day.   No facility-administered encounter medications on file as of 07/31/2020.    Recent Relevant Labs: Lab Results  Component Value Date/Time   HGBA1C 6.8 (A) 04/18/2020 11:35 AM   HGBA1C 6.8 (A) 12/07/2019 10:51 AM   HGBA1C 7.3 (H) 08/31/2017 07:11 AM   HGBA1C 7.1 (H) 06/23/2016 03:36 PM   MICROALBUR 1.4 04/19/2019 12:08 PM   MICROALBUR 1.3 01/18/2017 04:05 PM    Kidney Function Lab Results  Component Value Date/Time   CREATININE 0.74 12/24/2019 02:20 PM   CREATININE 0.77 09/19/2019 11:10 AM   CREATININE 0.74 06/20/2019 11:50 AM   CREATININE 1.13 (H) 12/11/2018 02:03 PM   CREATININE 0.98 11/23/2017 11:17 AM   GFR 92.48 06/20/2019 11:50 AM   GFRNONAA 79 12/24/2019 02:20 PM   GFRAA 92 12/24/2019 02:20 PM    Current antihyperglycemic regimen: The patient is taking Janumet  What recent  interventions/DTPs have been made to improve glycemic control: Patient maintaining sugar  Have there been any recent hospitalizations or ED visits since last visit with CPP? The patient has not had any hospital or ED visits  Patient denies hypoglycemic symptoms Patient denies hyperglycemic symptoms  How often are you checking your blood sugar? The patient states that she checks her blood sugar every morning  What are your blood sugars ranging? The patient states that blood sugars range between 100-140 Fasting: This morning 111, yesterday 122, and the day before 137 Before  meals: NA After meals: NA Bedtime: NA During the week, how often does your blood glucose drop below 70? The patient states that she has not had any readings below 70  Are you checking your feet daily/regularly? The patient states that she does check her feet for redness, swelling, blisters and sores  Adherence Review: Is the patient currently on a STATIN medication? Atorvastatin, but patient states she has not taken in about 2 weeks. She states that she would like to switch to something else Is the patient currently on ACE/ARB medication? NO ACE/ARB Does the patient have >5 day gap between last estimated fill dates? No  Wendy Poet, [place] [phone]

## 2020-08-01 NOTE — Telephone Encounter (Signed)
Patient stopped taking atorvastatin 2 weeks ago, wants to switch to something else. Scheduled CCM televisit to discuss.

## 2020-08-07 ENCOUNTER — Ambulatory Visit (INDEPENDENT_AMBULATORY_CARE_PROVIDER_SITE_OTHER): Payer: Medicare Other | Admitting: Pharmacist

## 2020-08-07 ENCOUNTER — Other Ambulatory Visit: Payer: Self-pay

## 2020-08-07 ENCOUNTER — Other Ambulatory Visit: Payer: Self-pay | Admitting: Internal Medicine

## 2020-08-07 DIAGNOSIS — I2510 Atherosclerotic heart disease of native coronary artery without angina pectoris: Secondary | ICD-10-CM

## 2020-08-07 DIAGNOSIS — E785 Hyperlipidemia, unspecified: Secondary | ICD-10-CM | POA: Diagnosis not present

## 2020-08-07 DIAGNOSIS — J449 Chronic obstructive pulmonary disease, unspecified: Secondary | ICD-10-CM

## 2020-08-07 DIAGNOSIS — I10 Essential (primary) hypertension: Secondary | ICD-10-CM

## 2020-08-07 DIAGNOSIS — E118 Type 2 diabetes mellitus with unspecified complications: Secondary | ICD-10-CM

## 2020-08-07 MED ORDER — ROSUVASTATIN CALCIUM 40 MG PO TABS: 40.0000 mg | ORAL_TABLET | Freq: Every day | ORAL | 1 refills | Status: DC

## 2020-08-07 NOTE — Progress Notes (Signed)
Chronic Care Management Pharmacy Note  08/07/2020 Name:  Victoria [name] MRN:  [id number] DOB:  1946/01/12  Subjective: Victoria [name] is an 75 y.o. year old female who is a primary patient of Janith Lima, MD.  The CCM team was consulted for assistance with disease management and care coordination needs.    Engaged with patient by telephone for follow up visit in response to provider referral for pharmacy case management and/or care coordination services.   Consent to Services:  The patient was given the following information about Chronic Care Management services today, agreed to services, and gave verbal consent: 1. CCM service includes personalized support from designated clinical staff supervised by the primary care provider, including individualized plan of care and coordination with other care providers 2. 24/7 contact phone numbers for assistance for urgent and routine care needs. 3. Service will only be billed when office clinical staff spend 20 minutes or more in a month to coordinate care. 4. Only one practitioner may furnish and bill the service in a calendar month. 5.The patient may stop CCM services at any time (effective at the end of the month) by phone call to the office staff. 6. The patient will be responsible for cost sharing (co-pay) of up to 20% of the service fee (after annual deductible is met). Patient agreed to services and consent obtained.  Patient Care Team: Janith Lima, MD as PCP - General (Internal Medicine) Troy Sine, MD as PCP - Cardiology (Cardiology) Brand Males, MD as Consulting Physician (Pulmonary Disease) Arvella Nigh, MD as Consulting Physician (Obstetrics and Gynecology) Magrinat, Virgie Dad, MD as Consulting Physician (Oncology) Rolm Bookbinder, MD as Consulting Physician (General Surgery) Gery Pray, MD as Consulting Physician (Radiation Oncology) Troy Sine, MD as Consulting Physician (Cardiology) Causey,  Charlestine Massed, NP as Nurse Practitioner (Hematology and Oncology) Parrett, Fonnie Mu, NP as Nurse Practitioner (Pulmonary Disease) Renato Shin, MD as Consulting Physician (Endocrinology) Charlton Haws, [facility] as Pharmacist (Pharmacist)   Patient is originally from [place], moved to [place] in 1977. Moved to [place] in 1982, and came back to [place] in 1983. Pt is semi-retired from [employer]. 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 She is also active in her church. She has 1 son, 3 grandchildren, 3 great-grandchildren. She lives by herself, she has someone come to help clean.   Recent office visits: 12/24/19 Dr Ronnald Ramp OV: CPX. Labs stable. No med changes.  Recent consult visits: 04/30/20 NP Tammy Parrett (pulmonary): rx'd albuterol nebulizer.  05/03/20 Dr Claiborne Billings (cardiology): increased carvedilol to 6.25 mg BID. LDL goal < 70, tolerating atorvastatin 80 mg.  03/31/20 NP Wilber Bihari (heme/onc): f/u for breast cancer. Dx 06/2017. S/p L lumpectomy. Tx anastrozole May 2019-2024.  12/20/19 NP Tammy Parrett (pulmonary): f/u COPD, OSA. Restart CPAP. No med changes. Continue O2  12/07/19 Dr Loanne Drilling (endocrine): well controlled DM, no med changes.  Hospital visits: None in previous 6 months  Objective:  Lab Results  Component Value Date   CREATININE 0.74 12/24/2019   BUN 13 12/24/2019   GFR 92.48 06/20/2019   GFRNONAA 79 12/24/2019   GFRAA 92 12/24/2019   NA 138 12/24/2019   K 4.4 12/24/2019   CALCIUM 9.7 12/24/2019   CO2 32 12/24/2019   GLUCOSE 137 (H) 12/24/2019    Lab Results  Component Value Date/Time   HGBA1C 6.8 (A) 04/18/2020 11:35 AM   HGBA1C  6.8 (A) 12/07/2019 10:51 AM   HGBA1C 7.3 (H) 08/31/2017 07:11 AM   HGBA1C 7.1 (H) 06/23/2016 03:36 PM   FRUCTOSAMINE 238 01/09/2019 11:20 AM   GFR 92.48 06/20/2019 11:50 AM   GFR 97.05 04/19/2019 10:09 AM   MICROALBUR 1.4 04/19/2019 12:08  PM   MICROALBUR 1.3 01/18/2017 04:05 PM    Last diabetic Eye exam:  Lab Results  Component Value Date/Time   HMDIABEYEEXA No Retinopathy 03/28/2019 12:00 AM    Last diabetic Foot exam: No results found for: HMDIABFOOTEX   Lab Results  Component Value Date   CHOL 170 12/24/2019   HDL 48 (L) 12/24/2019   LDLCALC 92 12/24/2019   LDLDIRECT 121.0 09/05/2014   TRIG 201 (H) 12/24/2019   CHOLHDL 3.5 12/24/2019    Hepatic Function Latest Ref Rng & Units 09/19/2019 03/21/2019 01/08/2019  Total Protein 6.5 - 8.1 g/dL 6.9 7.1 6.6  Albumin 3.5 - 5.0 g/dL 3.3(L) 3.8 4.1  AST 15 - 41 U/L 10(L) 13(L) 12  ALT 0 - 44 U/L <'6 8 8  ' Alk Phosphatase 38 - 126 U/L 92 84 88  Total Bilirubin 0.3 - 1.2 mg/dL 0.3 0.4 0.3  Bilirubin, Direct 0.0 - 0.3 mg/dL - - -    Lab Results  Component Value Date/Time   TSH 2.20 04/18/2020 11:44 AM   TSH 1.38 12/24/2019 02:20 PM   FREET4 0.68 04/18/2020 11:44 AM   FREET4 0.89 10/05/2019 11:19 AM    CBC Latest Ref Rng & Units 12/24/2019 09/19/2019 06/20/2019  WBC 3.8 - 10.8 Thousand/uL 8.3 9.4 9.3  Hemoglobin 11.7 - 15.5 g/dL 11.7 11.4(L) 11.9(L)  Hematocrit 35.0 - 45.0 % 35.5 35.0(L) 36.5  Platelets 140 - 400 Thousand/uL 247 228 246.0    Lab Results  Component Value Date/Time   VD25OH 46.08 02/28/2014 08:55 AM    Clinical ASCVD: Yes  The 10-year ASCVD risk score Mikey Bussing DC Jr., et al., 2013) is: 28.1%   Values used to calculate the score:     Age: 52 years     Sex: Female     Is Non-Hispanic African American: Yes     Diabetic: Yes     Tobacco smoker: No     Systolic Blood Pressure: 749 mmHg     Is BP treated: Yes     HDL Cholesterol: 48 mg/dL     Total Cholesterol: 170 mg/dL    Depression screen [facility] 2/9 02/20/2020 04/19/2019 06/12/2018  Decreased Interest 0 0 0  Down, Depressed, Hopeless 1 0 0  PHQ - 2 Score 1 0 0  Altered sleeping - - -  Tired, decreased energy - - -  Change in appetite - - -  Feeling bad or failure about yourself  - - -  Trouble  concentrating - - -  Moving slowly or fidgety/restless - - -  Suicidal thoughts - - -  PHQ-9 Score - - -  Difficult doing work/chores - - -  Some recent data might be hidden     Social History   Tobacco Use  Smoking Status Former Smoker  . Packs/day: 1.00  . Years: 50.00  . Pack years: 50.00  . Types: Cigarettes  . Quit date: 04/16/2011  . Years since quitting: 9.3  Smokeless Tobacco Never Used  Tobacco Comment   quit that date when she had to go to ER    BP Readings from Last 3 Encounters:  04/30/20 (!) 126/58  04/23/20 (!) 170/90  04/18/20 (!) 178/62   Pulse Readings from  Last 3 Encounters:  04/30/20 89  04/23/20 77  04/18/20 93   Wt Readings from Last 3 Encounters:  04/30/20 215 lb (97.5 kg)  04/23/20 217 lb (98.4 kg)  04/18/20 215 lb (97.5 kg)   BMI Readings from Last 3 Encounters:  04/30/20 36.33 kg/m  04/23/20 36.67 kg/m  04/18/20 36.33 kg/m    Assessment/Interventions: Review of patient past medical history, allergies, medications, health status, including review of consultants reports, laboratory and other test data, was performed as part of comprehensive evaluation and provision of chronic care management services.   SDOH:  (Social Determinants of Health) assessments and interventions performed: Yes  SDOH Screenings   Alcohol Screen: Low Risk   . Last Alcohol Screening Score (AUDIT): 0  Depression (PHQ2-9): Low Risk   . PHQ-2 Score: 1  Financial Resource Strain: Low Risk   . Difficulty of Paying Living Expenses: Not hard at all  Food Insecurity: No Food Insecurity  . Worried About Charity fundraiser in the Last Year: Never true  . Ran Out of Food in the Last Year: Never true  Housing: Low Risk   . Last Housing Risk Score: 0  Physical Activity: Sufficiently Active  . Days of Exercise per Week: 5 days  . Minutes of Exercise per Session: 30 min  Social Connections: Socially Integrated  . Frequency of Communication with Friends and Family: More  than three times a week  . Frequency of Social Gatherings with Friends and Family: More than three times a week  . Attends Religious Services: More than 4 times per year  . Active Member of Clubs or Organizations: Yes  . Attends Archivist Meetings: More than 4 times per year  . Marital Status: Married  Stress: No Stress Concern Present  . Feeling of Stress : Not at all  Tobacco Use: Medium Risk  . Smoking Tobacco Use: Former Smoker  . Smokeless Tobacco Use: Never Used  Transportation Needs: No Transportation Needs  . Lack of Transportation (Medical): No  . Lack of Transportation (Non-Medical): No    CCM Care Plan  Allergies  Allergen Reactions  . Benicar [Olmesartan] Swelling    Swelling of face and arms   . [name] [Valsartan] Swelling    Swelling of face and arms   . Hydrocodone-Acetaminophen Nausea And Vomiting    Severe vomiting  . Lisinopril Cough  . Monosodium Glutamate Other (See Comments)    Facial swelling per pt  . Codeine Other (See Comments)    jittery  . Lead Acetate Rash  . Nickel Rash    Severe rash to infection: pt is allergic to all metals other than sterling silver or gold jewelry.     Medications Reviewed Today    Reviewed by Charlton Haws, [facility] (Pharmacist) on 08/07/20 at 76  Med List Status: <None>  Medication Order Taking? Sig Documenting Provider Last Dose Status Informant  Accu-Chek Softclix Lancets lancets [id number] Yes USE 1  TO CHECK GLUCOSE ONCE DAILY Renato Shin, MD Taking Active   acetaminophen (TYLENOL) 500 MG tablet [id number] Yes Take 1,000 mg by mouth every 6 (six) hours as needed. [provider] Taking Active   albuterol (PROVENTIL) (2.5 MG/3ML) 0.083% nebulizer solution [id number] Yes Take 3 mLs (2.5 mg total) by nebulization every 6 (six) hours as needed for wheezing or shortness of breath. Parrett, Fonnie Mu, NP Taking Active   Albuterol Sulfate (PROAIR RESPICLICK) 092 (90 Base) MCG/ACT AEPB [id number] Yes Inhale  1-2 puffs into the lungs every 6 (  six) hours as needed (for wheezing/shortness of breath). Brand Males, MD Taking Active   amLODipine (NORVASC) 10 MG tablet [id number] Yes Take 1 tablet (10 mg total) by mouth daily. Janith Lima, MD Taking Active   anastrozole (ARIMIDEX) 1 MG tablet [id number] Yes Take 1 tablet by mouth once daily Magrinat, Virgie Dad, MD Taking Active   aspirin EC 81 MG tablet [id number] Yes Take 1 tablet (81 mg total) by mouth daily. Kroeger, Lorelee Cover., PA-C Taking Active   Blood Glucose Monitoring Suppl (ACCU-CHEK AVIVA PLUS) w/Device KIT [id number] Yes 1 Device by Does not apply route daily. Use to monitor glucose levels once per day; E11.9 Renato Shin, MD Taking Active   carvedilol (COREG) 6.25 MG tablet [id number] Yes Take 1 tablet (6.25 mg total) by mouth 2 (two) times daily. Troy Sine, MD Taking Active   Cholecalciferol (VITAMIN D) 125 MCG (5000 UT) CAPS [id number] Yes Take 1 capsule by mouth daily. [provider] Taking Active   denosumab (PROLIA) 60 MG/ML SOLN injection [id number] Yes Inject 60 mg into the skin every 6 (six) months. Administer in upper arm, thigh, or abdomen [provider] Taking Active Self  Fluticasone-Umeclidin-Vilant (TRELEGY ELLIPTA) 100-62.5-25 MCG/INH AEPB [id number] Yes INHALE 1 DOSE INTO THE LUNGS DAILY. Brand Males, MD Taking Active   glucose blood (ACCU-CHEK GUIDE) test strip [id number] Yes USE 1 STRIP ONCE DAILY Renato Shin, MD Taking Active   glucose blood test strip [id number] Yes USE 1 STRIP ONCE DAILY Renato Shin, MD Taking Active   OXYGEN [id number] Yes Inhale 2-3 L into the lungs continuous. When exerting self [provider] Taking Active Self  potassium chloride SA (KLOR-CON) 20 MEQ tablet [id number] Yes Take 1 tablet (20 mEq total) by mouth 2 (two) times daily. Janith Lima, MD Taking Active   roflumilast (DALIRESP) 500 MCG TABS tablet [id number] Yes Take 1 tablet (500 mcg total) by mouth daily.  Brand Males, MD Taking Active   SitaGLIPtin-MetFORMIN HCl (JANUMET XR) 50-1000 MG TB24 [id number] Yes Take 2 tablets by mouth daily. Renato Shin, MD Taking Active   spironolactone (ALDACTONE) 25 MG tablet [id number] Yes Take 1 tablet (25 mg total) by mouth daily. Janith Lima, MD Taking Active   thiamine (VITAMIN B-1) 100 MG tablet [id number] Yes Take 1 tablet (100 mg total) by mouth every other day. Janith Lima, MD Taking Active           Patient Active Problem List   Diagnosis Date Noted  . Lung nodules 04/30/2020  . Thiamine deficiency 04/24/2019  . Aortic atherosclerosis ([place]) 11/23/2017  . Malignant neoplasm of upper-outer quadrant of left breast in female, estrogen receptor positive ([place]) 07/01/2017  . Chronic respiratory failure with hypoxia ([place]) 02/07/2017  . DDD (degenerative disc disease), lumbar 01/13/2017  . Estrogen deficiency 10/20/2016  . Type II diabetes mellitus with manifestations ([place]) 01/08/2016  . Visit for screening mammogram 10/14/2015  . Atherosclerosis of native coronary artery of native heart without angina pectoris 10/14/2015  . History of positive PPD 10/08/2015  . COPD, severe ([place]) 09/04/2015  . AAA (abdominal aortic aneurysm) without rupture ([place]) 05/13/2015  . Multiple thyroid nodules 05/13/2015  . OSA (obstructive sleep apnea) 11/15/2014  . Essential hypertension 03/11/2014  . Emphysema of lung ([place]) 03/11/2014  . Osteoporosis 02/11/2014  . Routine health maintenance 12/07/2011  . DJD (degenerative joint disease) of knee   . Hyperlipidemia with target LDL less than 100     Immunization History  Administered Date(s) Administered  .  Fluad Quad(high Dose 65+) 01/17/2019, 04/30/2020  . Influenza Split 01/16/2011  . Influenza, High Dose Seasonal PF 01/13/2017, 02/09/2018  . Influenza,inj,Quad PF,6+ Mos 02/01/2013, 02/27/2014, 04/07/2015, 01/08/2016  . PFIZER(Purple Top)SARS-COV-2 Vaccination 06/06/2019, 06/27/2019  . Pneumococcal  Conjugate-13 02/01/2013  . Pneumococcal Polysaccharide-23 10/29/2014  . Td 12/06/2011    Conditions to be addressed/monitored:  Hypertension, Hyperlipidemia, Diabetes, Coronary Artery Disease and COPD  Care Plan : [place]  Updates made by Charlton Haws, [place] since 08/07/2020 12:00 AM    Problem: Hypertension, Hyperlipidemia, Diabetes, Coronary Artery Disease and COPD   Priority: High    Long-Range Goal: Disease management   Start Date: 08/07/2020  Expected End Date: 02/07/2021  This Visit's Progress: On track  Priority: High  Note:   Current Barriers:  . Unable to independently monitor therapeutic efficacy . Suboptimal therapeutic regimen for hyperlipidemia/CAD  Pharmacist Clinical Goal(s):  [name] Patient will achieve adherence to monitoring guidelines and medication adherence to achieve therapeutic efficacy . adhere to plan to optimize therapeutic regimen for hyperlipidemia/CAD as evidenced by report of adherence to recommended medication management changes through collaboration with PharmD and provider.   Interventions: . 1:1 collaboration with Janith Lima, MD regarding development and update of comprehensive plan of care as evidenced by provider attestation and co-signature . Inter-disciplinary care team collaboration (see longitudinal plan of care) . Comprehensive medication review performed; medication list updated in electronic medical record  Hypertension   BP goal is:  <130/80 Patient checks BP at home 3-5x per week Patient home BP readings are ranging: 124/86   Patient has failed these meds in the past: n/a Patient is currently controlled on the following medications:   Amlodipine 10 mg daily AM  Carvedilol 6.25 mg BID   Spironolactone 25 mg daily AM   We discussed diet and exercise extensively; BP goals; benefits of medications; pt reports BP is somewhat variable when she is stressed about her brother who has dementia and lives in [place].    Plan: Continue current medications and control with diet and exercise    Hyperlipidemia    LDL goal < 70 Aortic atherosclerosis Coronary calcium score 625 (94th percentile) on coronary CT 02/2018  Patient has failed these meds in past: n/a Patient is currently controlled on the following medications:   Atorvastatin 80 mg daily PM - stopped 3 weeks ago  Aspirin 81 mg daily   We discussed:  pt had read about statins effect on blood sugars/diabetes onset and decided to stop taking atorvastatin about 3 weeks ago; discussed overall benefits of statins at length, pt agreed to start rosuvastatin 40 mg   Plan: Recommend rosuvastatin 40 mg to replace atorvastatin 80 mg   Diabetes    A1c goal <7% Checking BG: Daily Recent FBG Readings: 114-134   Patient has failed these meds in past: n/a Patient is currently controlled on the following medications:  Janumet XR 50-1000 mg daily - 2 tablets daily  [employer] ([employer])  Testing supplies    We discussed: diet and exercise extensively; Discussed importance of maintaining sugars at goal to prevent complications of diabetes including kidney damage, retinal damage, and cardiovascular disease   Plan: Continue current medications and control with diet and exercise   COPD    Last spirometry score: 04/15/2016 -FEV1 34% predicted -FEV1/FVC 0.47 Gold Grade: Gold 3 (FEV1 30-49%) Current COPD Classification:  B (high sx, <2 exacerbations/yr)    Patient has failed these meds in past: n/a Patient is currently controlled on  the following medications:   Trelegy 1 puff daily  Proair Respiclick 1-2 puff PRN  Roflumilast 500 mg daily  Oxygen 2 l/m - portable and stand-alone  Albuterol nebulization   Using maintenance inhaler regularly? Yes Frequency of rescue inhaler use:  3-5x times per week   We discussed:  proper inhaler technique; she uses rescue inhaler more often in cold weather; pt is interested in being in a clinical  trial for new medication - Dr Chase Caller is working with her on this;    Plan: Continue current medications and control with diet and exercise    Patient Goals/Self-Care Activities . Patient will:  - take medications as prescribed focus on medication adherence by pill box check glucose daily, document, and provide at future appointments check blood pressure daily, document, and provide at future appointments  Follow Up Plan: Telephone follow up appointment with care management team member scheduled for: 3 months     Medication Assistance: None required.  Patient affirms current coverage meets needs.  [name] preferred pharmacy is:  [facility] - [place], [place] - [address] [place] [address] Phone: [phone] Fax: [phone]  [facility] ([address]), [place] [address] [place] ([place]) [place] [address] Phone: [phone] Fax: [phone]  Uses pill box? Yes Pt endorses 100% compliance  We discussed: Reviewed patient's [facility] medication and Epic medication profile assuring there are no discrepancies or gaps in therapy. Confirmed all fill dates appropriate and verified with patient that there is a sufficient quantity of all prescribed medications at home. Informed patient to call me any time if needing medications before scheduled deliveries.   Patient decided to: Utilize [facility] for medication synchronization, packaging and delivery  Care Plan and Follow Up Patient Decision:  Patient agrees to Care Plan and Follow-up.  Plan: Telephone follow up appointment with care management team member scheduled for:  3 months  Charlene Brooke, PharmD, [place], CPP Clinical Pharmacist [facility] at [facility] [phone]

## 2020-08-07 NOTE — Patient Instructions (Signed)
Visit Information  Phone number for Pharmacist: [phone]  Goals Addressed   None    Patient Care Plan: CCM Pharmacy Care Plan    Problem Identified: Hypertension, Hyperlipidemia, Diabetes, Coronary Artery Disease and COPD   Priority: High    Long-Range Goal: Disease management   Start Date: 08/07/2020  Expected End Date: 02/07/2021  This Visit's Progress: On track  Priority: High  Note:   Current Barriers:  . Unable to independently monitor therapeutic efficacy . Suboptimal therapeutic regimen for hyperlipidemia/CAD  Pharmacist Clinical Goal(s):  [name] Patient will achieve adherence to monitoring guidelines and medication adherence to achieve therapeutic efficacy . adhere to plan to optimize therapeutic regimen for hyperlipidemia/CAD as evidenced by report of adherence to recommended medication management changes through collaboration with PharmD and provider.   Interventions: . 1:1 collaboration with Janith Lima, MD regarding development and update of comprehensive plan of care as evidenced by provider attestation and co-signature . Inter-disciplinary care team collaboration (see longitudinal plan of care) . Comprehensive medication review performed; medication list updated in electronic medical record  Hypertension   BP goal is:  <130/80 Patient checks BP at home 3-5x per week Patient home BP readings are ranging: 124/86   Patient has failed these meds in the past: n/a Patient is currently controlled on the following medications:   Amlodipine 10 mg daily AM  Carvedilol 6.25 mg BID   Spironolactone 25 mg daily AM   We discussed diet and exercise extensively; BP goals; benefits of medications; pt reports BP is somewhat variable when she is stressed about her brother who has dementia and lives in [place].   Plan: Continue current medications and control with diet and exercise    Hyperlipidemia    LDL goal < 70 Aortic atherosclerosis Coronary calcium score 625 (94th  percentile) on coronary CT 02/2018  Patient has failed these meds in past: n/a Patient is currently controlled on the following medications:   Atorvastatin 80 mg daily PM - stopped 3 weeks ago  Aspirin 81 mg daily   We discussed:  pt had read about statins effect on blood sugars/diabetes onset and decided to stop taking atorvastatin about 3 weeks ago; discussed overall benefits of statins at length, pt agreed to start rosuvastatin 40 mg   Plan: Recommend rosuvastatin 40 mg to replace atorvastatin 80 mg   Diabetes    A1c goal <7% Checking BG: Daily Recent FBG Readings: 114-134   Patient has failed these meds in past: n/a Patient is currently controlled on the following medications:  Janumet XR 50-1000 mg daily - 2 tablets daily  [employer] ([employer])  Testing supplies    We discussed: diet and exercise extensively; Discussed importance of maintaining sugars at goal to prevent complications of diabetes including kidney damage, retinal damage, and cardiovascular disease   Plan: Continue current medications and control with diet and exercise   COPD    Last spirometry score: 04/15/2016 -FEV1 34% predicted -FEV1/FVC 0.47 Gold Grade: Gold 3 (FEV1 30-49%) Current COPD Classification:  B (high sx, <2 exacerbations/yr)    Patient has failed these meds in past: n/a Patient is currently controlled on the following medications:   Trelegy 1 puff daily  Proair Respiclick 1-2 puff PRN  Roflumilast 500 mg daily  Oxygen 2 l/m - portable and stand-alone  Albuterol nebulization   Using maintenance inhaler regularly? Yes Frequency of rescue inhaler use:  3-5x times per week   We discussed:  proper inhaler technique; she uses rescue inhaler more often  in cold weather; pt is interested in being in a clinical trial for new medication - Dr Chase Caller is working with her on this;    Plan: Continue current medications and control with diet and exercise    Patient  Goals/Self-Care Activities . Patient will:  - take medications as prescribed focus on medication adherence by pill box check glucose daily, document, and provide at future appointments check blood pressure daily, document, and provide at future appointments  Follow Up Plan: Telephone follow up appointment with care management team member scheduled for: 3 months      The patient verbalized understanding of instructions, educational materials, and care plan provided today and declined offer to receive copy of patient instructions, educational materials, and care plan.  Telephone follow up appointment with pharmacy team member scheduled for: 3 months  Charlene Brooke, PharmD, [facility] Clinical Pharmacist [facility] [phone]

## 2020-08-11 DIAGNOSIS — J449 Chronic obstructive pulmonary disease, unspecified: Secondary | ICD-10-CM | POA: Diagnosis not present

## 2020-08-21 ENCOUNTER — Other Ambulatory Visit: Payer: Self-pay

## 2020-08-21 ENCOUNTER — Ambulatory Visit: Payer: Medicare Other | Admitting: Endocrinology

## 2020-08-21 VITALS — BP 190/90 | HR 78 | Ht 64.5 in | Wt 213.0 lb

## 2020-08-21 DIAGNOSIS — E118 Type 2 diabetes mellitus with unspecified complications: Secondary | ICD-10-CM

## 2020-08-21 LAB — POCT GLYCOSYLATED HEMOGLOBIN (HGB A1C): Hemoglobin A1C: 6.4 % — AB (ref 4.0–5.6)

## 2020-08-21 LAB — TSH: TSH: 2.21 u[IU]/mL (ref 0.35–4.50)

## 2020-08-21 LAB — T4, FREE: Free T4: 0.8 ng/dL (ref 0.60–1.60)

## 2020-08-21 NOTE — Progress Notes (Signed)
Subjective:    Patient ID: [name], female    DOB: [birth year], 75 y.o.   MRN: [id number]  HPI Pt returns for f/u of diabetes mellitus: DM type: 2 Dx'ed: 7017 Complications: CAD and PAD Therapy: Janumet GDM: never DKA: never Severe hypoglycemia: never Pancreatitis: never Pancreatic imaging: normal on 2008 CT.   Other: edema limits rx options; fructosamine showed better glycemic control than A1c.   Interval history: pt says cbg's are well-controlled.  Pt also has hyperthyroidism (dx'ed 2019; US showed MNG; bx of RLL and RML nodules in 2019 showed beth cat 2; she had RAI 11/20; since then, she is euthyroid off rx).   Past Medical History:  Diagnosis Date  . Abdominal aneurysm ([name])    Dr. Oneida [name] follows lLOV 2 ''17 per pt "around 2 cm"  . Anemia    as a child  . Arthritis    left ankle, right knee, right SI joint, wrists, lower back  . Breast cancer in female [name])    Left  . COPD (chronic obstructive pulmonary disease) ([place])    ephysema-Dr. Chase [name]  . Dyspnea   . Headache    as a child would have terrible headaches during season changes  . 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 Hyperlipidemia   . Hypertension   . Multiple thyroid nodules   . Murmur, cardiac 1950  . Osteoporosis   . Pneumonia   . Pre-diabetes   . Requires continuous at home supplemental oxygen    2 L 24/7  . Sebaceous cyst    hairline sebaceous cyst left posterior neck to be excised 04-13-16 by Dr. Harlow [name] in [name]  . Sleep apnea    cpap used sometimes, uses oxygen concentrator 2 l/m nasally bedtime  . Varicella as child    Past Surgical History:  Procedure Laterality Date  . APPENDECTOMY     2008  . BREAST LUMPECTOMY Left 08/31/2017  . BREAST LUMPECTOMY WITH RADIOACTIVE SEED LOCALIZATION Left 08/10/2017   Procedure: BREAST LUMPECTOMY WITH RADIOACTIVE SEED LOCALIZATION;  Surgeon: Victoria Bookbinder, MD;  Location: [name];  Service: General;  Laterality: Left;  .  [place]  . COLONOSCOPY    . COLONOSCOPY WITH PROPOFOL N/A 04/16/2016   Procedure: COLONOSCOPY WITH PROPOFOL;  Surgeon: Victoria Ada, MD;  Location: [place] [name];  Service: [name];  Laterality: N/A;  . CYST REMOVAL NECK Left 04/13/2016   Procedure: EXCISION OF SEBACEOUS CYST LEFT POSTERIOR NECK;  Surgeon: Victoria Gemma, MD;  Location: [name];  Service: General;  Laterality: Left;  . Excision of Pelvic Absess, Right Ovary     2008  . RE-EXCISION OF BREAST CANCER,SUPERIOR MARGINS Left 08/31/2017   Procedure: RE-EXCISION OF LEFT  BREAST MARGINS ERAS PATHWAY;  Surgeon: Victoria Bookbinder, MD;  Location: [place] [name];  Service: General;  Laterality: Left;  . TUBAL LIGATION  1980    Social History   Socioeconomic History  . Marital status: Divorced    Spouse name: Not on file  . Number of children: 1  . Years of education: 98  . Highest education level: Not on file  Occupational History  . Occupation: Social worker, Technical sales engineer: [employer]  Tobacco Use  . Smoking status: Former Smoker    Packs/day: 1.00    Years: 50.00    Pack years: 50.00    Types: Cigarettes    Quit date: 04/16/2011    Years since quitting: 9.3  .  Smokeless tobacco: Never Used  . Tobacco comment: quit that date when she had to go to ER   Vaping Use  . Vaping Use: Never used  Substance and Sexual Activity  . Alcohol use: Yes    Alcohol/week: 0.0 standard drinks    Comment: rare occasion  . Drug use: No    Comment: no marijuana since 2012  . Sexual activity: Not Currently  Other Topics Concern  . Not on file  Social History Narrative   HSG; [name], [place]. . Married '69 - 9 yrs/divorced. 1 son - '72; no grandchildren.   Work - developmentally disabled, [name]. Lives alone. No h/o physical or sexual abuse. ACP - no living will - wants information. Provided packet of information. On 08/05/2011: she stated she was distant cousins to celebrities  [name] and [name]      Social Determinants of Health   Financial Resource Strain: Low Risk   . Difficulty of Paying Living Expenses: Not hard at all  Food Insecurity: No Food Insecurity  . Worried About Charity fundraiser in the Last Year: Never true  . Ran Out of Food in the Last Year: Never true  Transportation Needs: No Transportation Needs  . Lack of Transportation (Medical): No  . Lack of Transportation (Non-Medical): No  Physical Activity: Sufficiently Active  . Days of Exercise per Week: 5 days  . Minutes of Exercise per Session: 30 min  Stress: No Stress Concern Present  . Feeling of Stress : Not at all  Social Connections: Socially Integrated  . Frequency of Communication with Friends and Family: More than three times a week  . Frequency of Social Gatherings with Friends and Family: More than three times a week  . Attends Religious Services: More than 4 times per year  . Active Member of Clubs or Organizations: Yes  . Attends Archivist Meetings: More than 4 times per year  . Marital Status: Married  Human resources officer Violence: Not on file    Current Outpatient Medications on File Prior to Visit  Medication Sig Dispense Refill  . Accu-Chek Softclix Lancets lancets USE 1  TO CHECK GLUCOSE ONCE DAILY 100 each 0  . acetaminophen (TYLENOL) 500 MG tablet Take 1,000 mg by mouth every 6 (six) hours as needed.    [name] albuterol (PROVENTIL) (2.5 MG/3ML) 0.083% nebulizer solution Take 3 mLs (2.5 mg total) by nebulization every 6 (six) hours as needed for wheezing or shortness of breath. 75 mL 3  . Albuterol Sulfate (PROAIR RESPICLICK) 935 (90 Base) MCG/ACT AEPB Inhale 1-2 puffs into the lungs every 6 (six) hours as needed (for wheezing/shortness of breath). 1 each 5  . amLODipine (NORVASC) 10 MG tablet Take 1 tablet (10 mg total) by mouth daily. 90 tablet 1  . anastrozole (ARIMIDEX) 1 MG tablet Take 1 tablet by mouth once daily 90 tablet 0  . aspirin EC 81 MG  tablet Take 1 tablet (81 mg total) by mouth daily. 90 tablet 3  . Blood Glucose Monitoring Suppl (ACCU-CHEK AVIVA PLUS) w/Device KIT 1 Device by Does not apply route daily. Use to monitor glucose levels once per day; E11.9 1 kit 0  . carvedilol (COREG) 6.25 MG tablet Take 1 tablet (6.25 mg total) by mouth 2 (two) times daily. 180 tablet 3  . Cholecalciferol (VITAMIN D) 125 MCG (5000 UT) CAPS Take 1 capsule by mouth daily.    [name] denosumab (PROLIA) 60 MG/ML SOLN injection Inject 60 mg into  the skin every 6 (six) months. Administer in upper arm, thigh, or abdomen    . Fluticasone-Umeclidin-Vilant (TRELEGY ELLIPTA) 100-62.5-25 MCG/INH AEPB INHALE 1 DOSE INTO THE LUNGS DAILY. 60 each 5  . glucose blood (ACCU-CHEK GUIDE) test strip USE 1 STRIP ONCE DAILY 100 each 0  . glucose blood test strip USE 1 STRIP ONCE DAILY 100 each 12  . OXYGEN Inhale 2-3 L into the lungs continuous. When exerting self    . potassium chloride SA (KLOR-CON) 20 MEQ tablet Take 1 tablet (20 mEq total) by mouth 2 (two) times daily. 180 tablet 0  . roflumilast (DALIRESP) 500 MCG TABS tablet Take 1 tablet (500 mcg total) by mouth daily. 90 tablet 1  . rosuvastatin (CRESTOR) 40 MG tablet Take 1 tablet (40 mg total) by mouth daily. 90 tablet 1  . SitaGLIPtin-MetFORMIN HCl (JANUMET XR) 50-1000 MG TB24 Take 2 tablets by mouth daily. 180 tablet 3  . spironolactone (ALDACTONE) 25 MG tablet Take 1 tablet (25 mg total) by mouth daily. 90 tablet 0  . thiamine (VITAMIN B-1) 100 MG tablet Take 1 tablet (100 mg total) by mouth every other day. 45 tablet 1   No current facility-administered medications on file prior to visit.    Allergies  Allergen Reactions  . Benicar [Olmesartan] Swelling    Swelling of face and arms   . [name] [Valsartan] Swelling    Swelling of face and arms   . Hydrocodone-Acetaminophen Nausea And Vomiting    Severe vomiting  . Lisinopril Cough  . Monosodium Glutamate Other (See Comments)    Facial swelling per pt   . Codeine Other (See Comments)    jittery  . Lead Acetate Rash  . Nickel Rash    Severe rash to infection: pt is allergic to all metals other than sterling silver or gold jewelry.     Family History  Problem Relation Age of Onset  . Heart disease Mother        MI - fatal  . Hypertension Mother   . Stroke Father 29       fatal  . Alzheimer's disease Father   . Alzheimer's disease Brother   . Hyperlipidemia Brother   . Hypertension Brother   . Diabetes Brother   . Hypertension Brother   . Hyperlipidemia Brother   . Breast cancer Paternal Grandmother     BP (!) 190/90 (BP Location: Right Arm, Patient Position: Sitting, Cuff Size: Normal)   Pulse 78   Ht 5' 4.5" (1.638 m)   Wt 213 lb (96.6 kg)   SpO2 (!) 89%   BMI 36.00 kg/m    Review of Systems     Objective:   Physical Exam VITAL SIGNS:  See vs page GENERAL: no distress.  In [name] NECK: 2 cm left thyroid nodule is palpable.     Lab Results  Component Value Date   HGBA1C 6.4 (A) 08/21/2020       Assessment & Plan:  Type 2 DM: well-controlled.  MNG: check TFT.  HTN: is noted today.   Patient Instructions  Your blood pressure is high today.  Please see your primary care provider soon, to have it rechecked Please continue the same Janumet Blood tests are requested for you today.  We'll let you know about the results.    check your blood sugar once a day.  vary the time of day when you check, between before the 3 meals, and at bedtime.  also check if you have symptoms of your  blood sugar being too high or too low.  please keep a record of the readings and bring it to your next appointment here (or you can bring the meter itself).  You can write it on any piece of paper.  please call us sooner if your blood sugar goes below 70, or if you have a lot of readings over 200. Please come back for a follow-up appointment in 4 months.

## 2020-08-21 NOTE — Patient Instructions (Addendum)
Your blood pressure is high today.  Please see your primary care provider soon, to have it rechecked Please continue the same Janumet Blood tests are requested for you today.  We'll let you know about the results.    check your blood sugar once a day.  vary the time of day when you check, between before the 3 meals, and at bedtime.  also check if you have symptoms of your blood sugar being too high or too low.  please keep a record of the readings and bring it to your next appointment here (or you can bring the meter itself).  You can write it on any piece of paper.  please call us sooner if your blood sugar goes below 70, or if you have a lot of readings over 200. Please come back for a follow-up appointment in 4 months.

## 2020-08-25 ENCOUNTER — Telehealth: Payer: Self-pay

## 2020-08-25 NOTE — Telephone Encounter (Addendum)
Pt contacted ----- Message from Renato Shin, MD sent at 08/21/2020  6:27 PM EDT ----- please contact patient: Thyroid is normal--good

## 2020-08-26 ENCOUNTER — Telehealth: Payer: Self-pay

## 2020-08-26 NOTE — Progress Notes (Addendum)
    Chronic Care Management Pharmacy Assistant   Name: [name]  MRN: [id number] DOB: May 23, 1945  Reason for Encounter: Medication Review    Medications: Outpatient Encounter Medications as of 08/26/2020  Medication Sig   Accu-Chek Softclix Lancets lancets USE 1  TO CHECK GLUCOSE ONCE DAILY   acetaminophen (TYLENOL) 500 MG tablet Take 1,000 mg by mouth every 6 (six) hours as needed.   albuterol (PROVENTIL) (2.5 MG/3ML) 0.083% nebulizer solution Take 3 mLs (2.5 mg total) by nebulization every 6 (six) hours as needed for wheezing or shortness of breath.   Albuterol Sulfate (PROAIR RESPICLICK) 409 (90 Base) MCG/ACT AEPB Inhale 1-2 puffs into the lungs every 6 (six) hours as needed (for wheezing/shortness of breath).   amLODipine (NORVASC) 10 MG tablet Take 1 tablet (10 mg total) by mouth daily.   anastrozole (ARIMIDEX) 1 MG tablet Take 1 tablet by mouth once daily   aspirin EC 81 MG tablet Take 1 tablet (81 mg total) by mouth daily.   Blood Glucose Monitoring Suppl (ACCU-CHEK AVIVA PLUS) w/Device KIT 1 Device by Does not apply route daily. Use to monitor glucose levels once per day; E11.9   carvedilol (COREG) 6.25 MG tablet Take 1 tablet (6.25 mg total) by mouth 2 (two) times daily.   Cholecalciferol (VITAMIN D) 125 MCG (5000 UT) CAPS Take 1 capsule by mouth daily.   denosumab (PROLIA) 60 MG/ML SOLN injection Inject 60 mg into the skin every 6 (six) months. Administer in upper arm, thigh, or abdomen   Fluticasone-Umeclidin-Vilant (TRELEGY ELLIPTA) 100-62.5-25 MCG/INH AEPB INHALE 1 DOSE INTO THE LUNGS DAILY.   glucose blood (ACCU-CHEK GUIDE) test strip USE 1 STRIP ONCE DAILY   glucose blood test strip USE 1 STRIP ONCE DAILY   OXYGEN Inhale 2-3 L into the lungs continuous. When exerting self   potassium chloride SA (KLOR-CON) 20 MEQ tablet Take 1 tablet (20 mEq total) by mouth 2 (two) times daily.   roflumilast (DALIRESP) 500 MCG TABS tablet Take 1 tablet (500 mcg total) by mouth  daily.   rosuvastatin (CRESTOR) 40 MG tablet Take 1 tablet (40 mg total) by mouth daily.   SitaGLIPtin-MetFORMIN HCl (JANUMET XR) 50-1000 MG TB24 Take 2 tablets by mouth daily.   spironolactone (ALDACTONE) 25 MG tablet Take 1 tablet (25 mg total) by mouth daily.   thiamine (VITAMIN B-1) 100 MG tablet Take 1 tablet (100 mg total) by mouth every other day.   No facility-administered encounter medications on file as of 08/26/2020.   Reviewed chart for medication changes ahead of medication coordination call.   BP Readings from Last 3 Encounters:  08/21/20 (!) 190/90  04/30/20 (!) 126/58  04/23/20 (!) 170/90    Lab Results  Component Value Date   HGBA1C 6.4 (A) 08/21/2020    Star Rating Drugs: Rosuvastatin - Last fill 08/07/20 30D  [name], RMA Clinical Pharmacists Assistant [phone]  Unable to reach patient Time Spent: 18

## 2020-09-04 ENCOUNTER — Telehealth: Payer: Self-pay | Admitting: Adult Health

## 2020-09-04 MED ORDER — AZITHROMYCIN 250 MG PO TABS: ORAL_TABLET | ORAL | 0 refills | Status: DC

## 2020-09-04 MED ORDER — PREDNISONE 10 MG PO TABS: ORAL_TABLET | ORAL | 0 refills | Status: DC

## 2020-09-04 NOTE — Telephone Encounter (Signed)
Diagnosis COPD exacerbation   Z PAK  Please take prednisone 40 mg x1 day, then 30 mg x1 day, then 20 mg x1 day, then 10 mg x1 day, and then 5 mg x1 day and stop

## 2020-09-04 NOTE — Telephone Encounter (Signed)
Called and spoke with patient. She verbalized understanding of recs. She wishes to have the zpak and prednisone called into [facility].

## 2020-09-04 NOTE — Telephone Encounter (Signed)
Spoke with the pt  She c/o increased SOB, wheezing, cough with light yellow sputum with specks of blood at times x 2 days  She has also had some nasal congestion and bloody nasal d/c  She has been taking mucinex and nasal decongestant without much relief  She denies any fever or chills, states has some aches in her abdomen that she relates to heavy coughing  Has had covid vax x 3  She is still on trelegy daily and uses her albuterol inhaler a few times per wk  She asks for zpack and pred taper bc these have helped in the past  Please advise thanks!  Allergies  Allergen Reactions  . Benicar [Olmesartan] Swelling    Swelling of face and arms   . [name] [Valsartan] Swelling    Swelling of face and arms   . Hydrocodone-Acetaminophen Nausea And Vomiting    Severe vomiting  . Lisinopril Cough  . Monosodium Glutamate Other (See Comments)    Facial swelling per pt  . Codeine Other (See Comments)    jittery  . Lead Acetate Rash  . Nickel Rash    Severe rash to infection: pt is allergic to all metals other than sterling silver or gold jewelry.

## 2020-09-11 DIAGNOSIS — J449 Chronic obstructive pulmonary disease, unspecified: Secondary | ICD-10-CM | POA: Diagnosis not present

## 2020-09-22 ENCOUNTER — Telehealth: Payer: Self-pay | Admitting: Pharmacist

## 2020-09-22 NOTE — Progress Notes (Addendum)
Chronic Care Management Pharmacy Assistant   Name: [name]  MRN: [id number] DOB: December 24, 1945   Reason for Encounter: Medication Review    Medications: Outpatient Encounter Medications as of 09/22/2020  Medication Sig   Accu-Chek Softclix Lancets lancets USE 1  TO CHECK GLUCOSE ONCE DAILY   acetaminophen (TYLENOL) 500 MG tablet Take 1,000 mg by mouth every 6 (six) hours as needed.   albuterol (PROVENTIL) (2.5 MG/3ML) 0.083% nebulizer solution Take 3 mLs (2.5 mg total) by nebulization every 6 (six) hours as needed for wheezing or shortness of breath.   Albuterol Sulfate (PROAIR RESPICLICK) 237 (90 Base) MCG/ACT AEPB Inhale 1-2 puffs into the lungs every 6 (six) hours as needed (for wheezing/shortness of breath).   amLODipine (NORVASC) 10 MG tablet Take 1 tablet (10 mg total) by mouth daily.   anastrozole (ARIMIDEX) 1 MG tablet Take 1 tablet by mouth once daily   aspirin EC 81 MG tablet Take 1 tablet (81 mg total) by mouth daily.   azithromycin (ZITHROMAX) 250 MG tablet Take 2 tablets on first day, then take 1 tablet daily until finished.   Blood Glucose Monitoring Suppl (ACCU-CHEK AVIVA PLUS) w/Device KIT 1 Device by Does not apply route daily. Use to monitor glucose levels once per day; E11.9   carvedilol (COREG) 6.25 MG tablet Take 1 tablet (6.25 mg total) by mouth 2 (two) times daily.   Cholecalciferol (VITAMIN D) 125 MCG (5000 UT) CAPS Take 1 capsule by mouth daily.   denosumab (PROLIA) 60 MG/ML SOLN injection Inject 60 mg into the skin every 6 (six) months. Administer in upper arm, thigh, or abdomen   Fluticasone-Umeclidin-Vilant (TRELEGY ELLIPTA) 100-62.5-25 MCG/INH AEPB INHALE 1 DOSE INTO THE LUNGS DAILY.   glucose blood (ACCU-CHEK GUIDE) test strip USE 1 STRIP ONCE DAILY   glucose blood test strip USE 1 STRIP ONCE DAILY   OXYGEN Inhale 2-3 L into the lungs continuous. When exerting self   potassium chloride SA (KLOR-CON) 20 MEQ tablet Take 1 tablet (20 mEq total) by  mouth 2 (two) times daily.   predniSONE (DELTASONE) 10 MG tablet Take 4 tabs x1 day, 3 tabs x1 day, 2 tabs x1 day, 1 tab x1 day and then 1/2 tab x1 day.   roflumilast (DALIRESP) 500 MCG TABS tablet Take 1 tablet (500 mcg total) by mouth daily.   rosuvastatin (CRESTOR) 40 MG tablet Take 1 tablet (40 mg total) by mouth daily.   SitaGLIPtin-MetFORMIN HCl (JANUMET XR) 50-1000 MG TB24 Take 2 tablets by mouth daily.   spironolactone (ALDACTONE) 25 MG tablet Take 1 tablet (25 mg total) by mouth daily.   thiamine (VITAMIN B-1) 100 MG tablet Take 1 tablet (100 mg total) by mouth every other day.   No facility-administered encounter medications on file as of 09/22/2020.    Reviewed chart for medication changes ahead of medication coordination call.  No OVs, Consults, or hospital visits since last care coordination call / Pharmacist visit. No medication changes indicated  BP Readings from Last 3 Encounters:  08/21/20 (!) 190/90  04/30/20 (!) 126/58  04/23/20 (!) 170/90    Lab Results  Component Value Date   HGBA1C 6.4 (A) 08/21/2020     Patient obtains medications through Adherence Packaging  30 Days   Last adherence delivery included (date: 06/25/20): Spironolactone 25 mg 1 tab daily  Atorvastatin 80 mg 1 tab by mouth daily, evening  Amlodipine 10 mg 1 tab by mouth daily  Albuterol sulfate 2.5 mg/45m nebulizer every 6 hours as  needed  Trelegy Aer 100 mcg Ellipta inhale one dose into the lungs once daily Janumet Xr 50-1000 mg take two tabs by mouth once daily   Patient is due for next adherence delivery on: 10/02/20. Called patient and reviewed medication needs. Rosuvastatin 39m - 1 tab daily breakfast Trelegy - inhale 1 dose into lungs once daily Janumet - 2 tabs daily breakfast Lancets - check blood sugar daily Daliresp 500 mcg- 1 tab daily  Amlodipine 10 mg - breakfast Anastrozole 1 mg - breakfast Potassium cl 20 meq - breakfast Spironolactone 25 mg - breakfast B complex -  breakfast Vitamin D3 - breakfast  Patient declined: Mucus relief er.  Star Rating Drugs: Rosuvastatin - 08/07/20 30D  LOrinda Kenner RMA Clinical Pharmacists Assistant [phone] Time Spent: 534

## 2020-09-29 NOTE — Progress Notes (Incomplete)
Victoria [name]  [phone] [phone]     ID: Victoria [name] DOB: 10-03-1945  MR#: [id number]  BTY#:[id number]  Patient Care Team: Janith Lima, MD as PCP - General (Internal Medicine) Troy Sine, MD as PCP - Cardiology (Cardiology) Brand Males, MD as Consulting Physician (Pulmonary Disease) Arvella Nigh, MD as Consulting Physician (Obstetrics and Gynecology) Terrilee Dudzik, Virgie Dad, MD as Consulting Physician (Oncology) Rolm Bookbinder, MD as Consulting Physician (General Surgery) Gery Pray, MD as Consulting Physician (Radiation Oncology) Troy Sine, MD as Consulting Physician (Cardiology) Delice Bison, Charlestine Massed, NP as Nurse Practitioner (Hematology and Oncology) Parrett, Fonnie Mu, NP as Nurse Practitioner (Pulmonary Disease) Renato Shin, MD as Consulting Physician (Endocrinology) Charlton Haws, [facility] as Pharmacist (Pharmacist) OTHER MD:  CHIEF COMPLAINT: Estrogen receptor positive breast cancer   CURRENT TREATMENT: Anastrozole, denosumab/Prolia   INTERVAL HISTORY: Meribeth returns today for follow up of her estrogen receptor positive breast cancer.   She continues on anastrozole.  She had some hot flashes initially, but those has resolved.  She does not have any arthralgias or myalgias or vaginal dryness issues with this medication.  Her most recent bone density screening on 04/10/2019 showed a T-score of -2.2, which is considered osteopenic.  Since her last visit, she underwent bilateral diagnostic mammography with tomography at [facility] on 12/05/2019 showing: breast density category B; no evidence of malignancy in either breast.   She also underwent screening chest CT on 01/11/2020 showing: Lung-RADS 3, probably benign findings. Short-term follow up CT recommended.  Follow up CT was performed on 07/14/2020 showing: Lung-RADS 2, benign appearance or behavior.  She is seeing Dr. Loanne Drilling for her thyroid  issues and type two diabetes.  She is very pleased with the support and care she is receiving there and she tells me she is making real progress with the diabetes issue***   REVIEW OF SYSTEMS: [place]    COVID 19 VACCINATION STATUS: [place] x2, most recently 06/2019    HISTORY OF CURRENT ILLNESS: From the original intake note:  [name] had routine screening mammography on 06/16/2017 showing a possible abnormality in the left breast. She underwent unilateral left diagnostic mammography with tomography and left breast ultrasonography at [facility] on 06/22/2017 showing: breast density category B. Suspicious left breast mass in the 2:30 position upper outer quadrant measuring 0.6 x 0.7 x 0.6 cm located 2 cm from the nipple. Palpable thickening with distortion. There was no sonographic evidence of lest axillary lymphadenopathy.  Accordingly on 06/24/2017 she proceeded to biopsy of the left breast mass in question. The pathology from this procedure showed ([id number]):  Invasive mammary carcinoma, grade 1. E-cadherin is positive supporting Ductal carcinoma diagnosis. Prognostic indicators significant for: estrogen receptor, 100% positive and progesterone receptor, 100% positive, both with strong staining intensity. Proliferation marker Ki67 at 5%. HER2 not amplified with ratios of HER2/CEP17 signals 1.21 and average copies per cell 2.00.  The patient's subsequent history is as detailed below.   PAST MEDICAL HISTORY: Past Medical History:  Diagnosis Date  . Abdominal aneurysm ([place])    Dr. Oneida Alar follows lLOV 2 ''17 per pt "around 2 cm"  . Anemia    as a child  . Arthritis    left ankle, right knee, right SI joint, wrists, lower back  . Breast cancer in female [facility])    Left  . COPD (chronic obstructive pulmonary disease) ([place])    ephysema-Dr. Chase Caller  . Dyspnea   . Headache  as a child would have terrible headaches during season changes  . Heart murmur    congenital,  2 D echo '10  . Hyperlipidemia   . Hypertension   . Multiple thyroid nodules   . Murmur, cardiac 1950  . Osteoporosis   . Pneumonia   . Pre-diabetes   . Requires continuous at home supplemental oxygen    2 L 24/7  . Sebaceous cyst    hairline sebaceous cyst left posterior neck to be excised 04-13-16 by Dr. Harlow Asa in [place] [facility]  . Sleep apnea    cpap used sometimes, uses oxygen concentrator 2 l/m nasally bedtime  . Varicella as child    PAST SURGICAL HISTORY: Past Surgical History:  Procedure Laterality Date  . APPENDECTOMY     2008  . BREAST LUMPECTOMY Left 08/31/2017  . BREAST LUMPECTOMY WITH RADIOACTIVE SEED LOCALIZATION Left 08/10/2017   Procedure: BREAST LUMPECTOMY WITH RADIOACTIVE SEED LOCALIZATION;  Surgeon: Rolm Bookbinder, MD;  Location: [facility];  Service: General;  Laterality: Left;  . [place]  . COLONOSCOPY    . COLONOSCOPY WITH PROPOFOL N/A 04/16/2016   Procedure: COLONOSCOPY WITH PROPOFOL;  Surgeon: Carol Ada, MD;  Location: [facility];  Service: Endoscopy;  Laterality: N/A;  . CYST REMOVAL NECK Left 04/13/2016   Procedure: EXCISION OF SEBACEOUS CYST LEFT POSTERIOR NECK;  Surgeon: Armandina Gemma, MD;  Location: [facility];  Service: General;  Laterality: Left;  . Excision of Pelvic Absess, Right Ovary     2008  . RE-EXCISION OF BREAST CANCER,SUPERIOR MARGINS Left 08/31/2017   Procedure: RE-EXCISION OF LEFT  BREAST MARGINS ERAS PATHWAY;  Surgeon: Rolm Bookbinder, MD;  Location: [facility];  Service: General;  Laterality: Left;  . TUBAL LIGATION  1980    FAMILY HISTORY Family History  Problem Relation Age of Onset  . Heart disease Mother        MI - fatal  . Hypertension Mother   . Stroke Father 63       fatal  . Alzheimer's disease Father   . Alzheimer's disease Brother   . Hyperlipidemia Brother   . Hypertension Brother   . Diabetes Brother   . Hypertension Brother   . Hyperlipidemia Brother   . Breast cancer Paternal Grandmother    The patient's father died at age 24 due to a stroke and prostate cancer.  The patient's mother died at age 42 due to a heart attack and blood clot. The patient has 3 brothers and no sisters. She notes a paternal grandmother who was diagnosed with breast cancer at age 42. She notes a paternal cousin with lung cancer. She denies a family history of ovarian cancer.   GYNECOLOGIC HISTORY:  No LMP recorded. Patient is postmenopausal. Menarche: 75 years old Age at first live birth: 75 years old [place] P1 LMP: age 70 The patient had a right ovarian cyst which required removal. She notes that she still has her left ovary. She was on HRT for a short amount of time, but discontinued due to negative side affects. She was placed on Estroven as well.    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 She helps individuals with [employer] and program assisting on the computer. She is divorced. The patient's son, Mehak Roskelley, is disabled and works part-time at [employer] in [place]. The patient has 3 grandchildren and 3 great-grandchildren. She attends [employer].      ADVANCED DIRECTIVES:  Son, Eliseo Gum and daughter in law, Butch Penny   HEALTH MAINTENANCE: Social History   Tobacco Use  . Smoking status: Former Smoker    Packs/day: 1.00    Years: 50.00    Pack years: 50.00    Types: Cigarettes    Quit date: 04/16/2011    Years since quitting: 9.4  . Smokeless tobacco: Never Used  . Tobacco comment: quit that date when she had to go to ER   Vaping Use  . Vaping Use: Never used  Substance Use Topics  . Alcohol use: Yes    Alcohol/week: 0.0 standard drinks    Comment: rare occasion  . Drug use: No    Comment: no marijuana since 2012     Colonoscopy: December 2017/ Dr. Benson Norway  PAP: September 2016  Bone density: 12/08/2016 showed a T score of -2.4   Allergies  Allergen  Reactions  . Benicar [Olmesartan] Swelling    Swelling of face and arms   . [name] [Valsartan] Swelling    Swelling of face and arms   . Hydrocodone-Acetaminophen Nausea And Vomiting    Severe vomiting  . Lisinopril Cough  . Monosodium Glutamate Other (See Comments)    Facial swelling per pt  . Codeine Other (See Comments)    jittery  . Lead Acetate Rash  . Nickel Rash    Severe rash to infection: pt is allergic to all metals other than sterling silver or gold jewelry.     Current Outpatient Medications  Medication Sig Dispense Refill  . Accu-Chek Softclix Lancets lancets USE 1  TO CHECK GLUCOSE ONCE DAILY 100 each 0  . acetaminophen (TYLENOL) 500 MG tablet Take 1,000 mg by mouth every 6 (six) hours as needed.    [name] albuterol (PROVENTIL) (2.5 MG/3ML) 0.083% nebulizer solution Take 3 mLs (2.5 mg total) by nebulization every 6 (six) hours as needed for wheezing or shortness of breath. 75 mL 3  . Albuterol Sulfate (PROAIR RESPICLICK) 423 (90 Base) MCG/ACT AEPB Inhale 1-2 puffs into the lungs every 6 (six) hours as needed (for wheezing/shortness of breath). 1 each 5  . amLODipine (NORVASC) 10 MG tablet Take 1 tablet (10 mg total) by mouth daily. 90 tablet 1  . anastrozole (ARIMIDEX) 1 MG tablet Take 1 tablet by mouth once daily 90 tablet 0  . aspirin EC 81 MG tablet Take 1 tablet (81 mg total) by mouth daily. 90 tablet 3  . azithromycin (ZITHROMAX) 250 MG tablet Take 2 tablets on first day, then take 1 tablet daily until finished. 6 tablet 0  . Blood Glucose Monitoring Suppl (ACCU-CHEK AVIVA PLUS) w/Device KIT 1 Device by Does not apply route daily. Use to monitor glucose levels once per day; E11.9 1 kit 0  . carvedilol (COREG) 6.25 MG tablet Take 1 tablet (6.25 mg total) by mouth 2 (two) times daily. 180 tablet 3  . Cholecalciferol (VITAMIN D) 125 MCG (5000 UT) CAPS Take 1 capsule by mouth daily.    [name] denosumab (PROLIA) 60 MG/ML SOLN injection Inject 60 mg into the skin every 6 (six)  months. Administer in upper arm, thigh, or abdomen    . Fluticasone-Umeclidin-Vilant (TRELEGY ELLIPTA) 100-62.5-25 MCG/INH AEPB INHALE 1 DOSE INTO THE LUNGS DAILY. 60 each 5  . glucose blood (ACCU-CHEK GUIDE) test strip USE 1 STRIP ONCE DAILY 100 each 0  . glucose blood test strip USE 1 STRIP ONCE DAILY 100 each 12  . OXYGEN Inhale 2-3 L into  the lungs continuous. When exerting self    . potassium chloride SA (KLOR-CON) 20 MEQ tablet Take 1 tablet (20 mEq total) by mouth 2 (two) times daily. 180 tablet 0  . predniSONE (DELTASONE) 10 MG tablet Take 4 tabs x1 day, 3 tabs x1 day, 2 tabs x1 day, 1 tab x1 day and then 1/2 tab x1 day. 11 tablet 0  . roflumilast (DALIRESP) 500 MCG TABS tablet Take 1 tablet (500 mcg total) by mouth daily. 90 tablet 1  . rosuvastatin (CRESTOR) 40 MG tablet Take 1 tablet (40 mg total) by mouth daily. 90 tablet 1  . SitaGLIPtin-MetFORMIN HCl (JANUMET XR) 50-1000 MG TB24 Take 2 tablets by mouth daily. 180 tablet 3  . spironolactone (ALDACTONE) 25 MG tablet Take 1 tablet (25 mg total) by mouth daily. 90 tablet 0  . thiamine (VITAMIN B-1) 100 MG tablet Take 1 tablet (100 mg total) by mouth every other day. 45 tablet 1   No current facility-administered medications for this visit.    OBJECTIVE: African-American woman receiving oxygen by nasal cannula  There were no vitals filed for this visit.   There is no height or weight on file to calculate BMI.   Wt Readings from Last 3 Encounters:  08/21/20 213 lb (96.6 kg)  04/30/20 215 lb (97.5 kg)  04/23/20 217 lb (98.4 kg)  ECOG FS:2 - Symptomatic, <50% confined to bed  Sclerae unicteric, EOMs intact Wearing a mask No cervical or supraclavicular adenopathy Lungs no rales or rhonchi Heart regular rate and rhythm Abd soft, nontender, positive bowel sounds MSK no focal spinal tenderness, no upper extremity lymphedema Neuro: nonfocal, well oriented, appropriate affect Breasts:    {Sclerae unicteric, EOMs intact Wearing a  mask No cervical or supraclavicular adenopathy Lungs no rales or rhonchi Heart regular rate and rhythm Abd soft, nontender, positive bowel sounds MSK no focal spinal tenderness, no upper extremity lymphedema Neuro: nonfocal, well oriented, appropriate affect Breasts: The left breast has undergone lumpectomy.  There is no evidence of local recurrence.  Left breast is benign.  Both axillae are benign.}   LAB RESULTS:  CMP     Component Value Date/Time   NA 138 12/24/2019 1420   NA 139 01/08/2019 1025   K 4.4 12/24/2019 1420   CL 101 12/24/2019 1420   CO2 32 12/24/2019 1420   GLUCOSE 137 (H) 12/24/2019 1420   BUN 13 12/24/2019 1420   BUN 15 01/08/2019 1025   CREATININE 0.74 12/24/2019 1420   CALCIUM 9.7 12/24/2019 1420   PROT 6.9 09/19/2019 1110   PROT 6.6 01/08/2019 1025   ALBUMIN 3.3 (L) 09/19/2019 1110   ALBUMIN 4.1 01/08/2019 1025   AST 10 (L) 09/19/2019 1110   AST 11 (L) 12/11/2018 1403   ALT <6 09/19/2019 1110   ALT 7 12/11/2018 1403   ALKPHOS 92 09/19/2019 1110   BILITOT 0.3 09/19/2019 1110   BILITOT 0.3 01/08/2019 1025   BILITOT 0.3 12/11/2018 1403   GFRNONAA 79 12/24/2019 1420   GFRAA 92 12/24/2019 1420    Lab Results  Component Value Date   TOTALPROTELP 6.7 02/28/2014   ALBUMINELP 57.4 02/28/2014   A1GS 4.9 02/28/2014   A2GS 8.5 02/28/2014   BETS 7.5 (H) 02/28/2014   BETA2SER 5.3 02/28/2014   GAMS 16.4 02/28/2014   MSPIKE 0.31 02/28/2014   SPEI SEE NOTE 02/28/2014    No results found for: KPAFRELGTCHN, LAMBDASER, KAPLAMBRATIO  Lab Results  Component Value Date   WBC 8.3 12/24/2019   NEUTROABS 5,935  12/24/2019   HGB 11.7 12/24/2019   HCT 35.5 12/24/2019   MCV 85.7 12/24/2019   PLT 247 12/24/2019   No results found for: LABCA2  No components found for: [id number]  No results for input(s): INR in the last 168 hours.  No results found for: LABCA2  No results found for: SLH734  No results found for: KAJ681  No results found for:  LXB262  No results found for: CA2729  No components found for: HGQUANT  No results found for: CEA1 / No results found for: CEA1   No results found for: AFPTUMOR  No results found for: CHROMOGRNA  No results found for: HGBA, HGBA2QUANT, HGBFQUANT, HGBSQUAN (Hemoglobinopathy evaluation)   No results found for: LDH  Lab Results  Component Value Date   IRON 59 04/19/2019   IRONPCTSAT 16.4 (L) 04/19/2019   (Iron and TIBC)  Lab Results  Component Value Date   FERRITIN 84.2 04/19/2019    Urinalysis    Component Value Date/Time   COLORURINE YELLOW 04/19/2019 [place] 04/19/2019 1208   LABSPEC 1.015 04/19/2019 1208   PHURINE 6.5 04/19/2019 1208   GLUCOSEU NEGATIVE 04/19/2019 1208   HGBUR TRACE-INTACT (A) 04/19/2019 [place] 04/19/2019 [place] 04/19/2019 1208   PROTEINUR 30 (A) 04/18/2011 1942   UROBILINOGEN 0.2 04/19/2019 1208   NITRITE NEGATIVE 04/19/2019 1208   LEUKOCYTESUR SMALL (A) 04/19/2019 1208    STUDIES: No results found.   ELIGIBLE FOR AVAILABLE RESEARCH PROTOCOL: no  ASSESSMENT: 75 y.o. [place] woman status post left breast upper outer quadrant biopsy 06/24/2017 for a clinical T1b N0, stage IA invasive ductal carcinoma, grade 1, estrogen and progesterone receptor positive, HER-2 not amplified, with an MIB-105%  (1) status post left lumpectomy without sentinel lymph node sampling on 08/10/2017 showed invasive ductal carcinoma grade II, spanning 1.2 cm. High grade DCIS. Anterior margin was was focally positive for invasive ductal carcinoma. All other margins clear.   (a) additional surgery for margin clearance 08/31/2017 showed atypical ductal hyperplasia, negative margins  (2) given the patient's age, the tumor size, grade and prognostic profile, will forego Oncotype and avoid chemotherapy  (3) may forgo adjuvant radiation if comfortable with anti-estrogen therapy  (4) anastrozole started  08/30/2017  (5) osteoporosis: bone density on 02/27/2014 showed a T score of -2.9   (a) bone density on 12/08/2016 showed a T score of -2.4 (osteopenia).  (b) denosumab/Prolia started 02/09/2018   (c)bone density 04/10/2019 shows a T score of -1.5  (6) incidentally noted adrenal adenomas stable on repeat scans   (7) lung cancer screening:  (a) low-dose CT of the chest 10/28/2017 was benign.  There was significant aortic atherosclerosis and left main coronary artery disease, severe emphysema, and likely benign adrenal lesions  (b) repeat low-dose screening Chest CT 08/26/202o benign  (c) screening chest CT 01/11/2020 read as probably benign, 6-mo repeat recommended  (d) noncontrast chest CT scan 07/14/2020 stable--repeat in 1 year recommend   PLAN: Amberrose is now just over 2 years out from definitive surgery for her breast cancer with no evidence of disease recurrence.  This is favorable.  She is tolerating anastrozole well and the plan will be to continue that a total of 5 years.  She will be due for repeat mammography in July and I have entered that order.  Otherwise I will schedule a survivorship visit in November and I will see her again in August of next year.  Total encounter time 25 minutes.*  Virgie Dad. Devaeh Amadi, MD 09/30/20 8:33 AM [facility] On[facility] H[facility] [place] [address], [place] [address] Tel. [phone]    Fax. [phone]    I, Wilburn Mylar, am acting as scribe for Dr. Virgie Dad. Corrin Sieling.  I, Lurline Del MD, have reviewed the above documentation for accuracy and completeness, and I agree with the above.   *Total Encounter Time as defined by the Centers for Medicare and Medicaid Services includes, in addition to the face-to-face time of a patient visit (documented in the note above) non-face-to-face time: obtaining and reviewing outside history, ordering and reviewing medications, tests or procedures, care  coordination (communications with other health care professionals or caregivers) and documentation in the medical record.

## 2020-09-30 ENCOUNTER — Other Ambulatory Visit: Payer: Medicare Other

## 2020-09-30 ENCOUNTER — Telehealth: Payer: Self-pay | Admitting: Oncology

## 2020-09-30 ENCOUNTER — Ambulatory Visit: Payer: Medicare Other | Admitting: Oncology

## 2020-09-30 NOTE — Telephone Encounter (Signed)
R/s appts per 5/17 sch msg. Pt aware.  

## 2020-10-01 ENCOUNTER — Other Ambulatory Visit: Payer: Self-pay | Admitting: Endocrinology

## 2020-10-01 DIAGNOSIS — E118 Type 2 diabetes mellitus with unspecified complications: Secondary | ICD-10-CM

## 2020-10-06 NOTE — Progress Notes (Signed)
[place]  Telephone:(336) [phone] Fax:(336) [phone]     ID: [name] DOB: 1946/01/03  MR#: [id number]  KGU#:[id number]  Patient Care Team: Janith Lima, MD as PCP - General (Internal Medicine) Troy Sine, MD as PCP - Cardiology (Cardiology) Brand Males, MD as Consulting Physician (Pulmonary Disease) Arvella Nigh, MD as Consulting Physician (Obstetrics and Gynecology) Wilborn Membreno, Virgie Dad, MD as Consulting Physician (Oncology) Rolm Bookbinder, MD as Consulting Physician (General Surgery) Gery Pray, MD as Consulting Physician (Radiation Oncology) Troy Sine, MD as Consulting Physician (Cardiology) Delice Bison, Charlestine Massed, NP as Nurse Practitioner (Hematology and Oncology) Parrett, Fonnie Mu, NP as Nurse Practitioner (Pulmonary Disease) Renato Shin, MD as Consulting Physician (Endocrinology) Charlton Haws, [facility] as Pharmacist (Pharmacist) OTHER MD:  CHIEF COMPLAINT: Estrogen receptor positive breast cancer   CURRENT TREATMENT: Anastrozole   INTERVAL HISTORY: Aylana returns today for follow up of her estrogen receptor positive breast cancer.   She continues on anastrozole.  She had some hot flashes initially, but those has resolved.  She does not have any arthralgias or myalgias or vaginal dryness issues with this medication.  Her most recent bone density screening on 04/10/2019 showed a T-score of -2.2, which is considered osteopenic.  She receives Prolia through her primary care physician's office.  Since her last visit, she underwent bilateral diagnostic mammography with tomography at [facility] on 12/05/2019 showing: breast density category B; no evidence of malignancy in either breast.   She also underwent screening chest CT on 8;/27/2021 showing: Lung-RADS 3, probably benign findings. Short-term follow up recommended.  She returned for follow up chest CT on 2/29/2022 showing: Lung-RADS 2, benign appearance or  behavior.  Resumption of annual screening suggested   REVIEW OF SYSTEMS: Mescal continues to have significant respiratory issues which limits her greatly.  She sees Dr. Chase Caller for this.  She is on 24/7 oxygen at present.  She tells me she could not walk from the entrance to the exam room today and that is why she asked for a wheelchair.  At home she walks very slowly.  When she was shopping she uses a riding cart.  She had significant issues with allergy this year requiring antibiotics and steroids.  Aside from the respiratory issues detailed review of systems today was stable   COVID 19 VACCINATION STATUS: [place] x2, most recently 06/2019, and status post 1 booster    HISTORY OF CURRENT ILLNESS: From the original intake note:  Saara Kijowski had routine screening mammography on 06/16/2017 showing a possible abnormality in the left breast. She underwent unilateral left diagnostic mammography with tomography and left breast ultrasonography at [facility] on 06/22/2017 showing: breast density category B. Suspicious left breast mass in the 2:30 position upper outer quadrant measuring 0.6 x 0.7 x 0.6 cm located 2 cm from the nipple. Palpable thickening with distortion. There was no sonographic evidence of lest axillary lymphadenopathy.  Accordingly on 06/24/2017 she proceeded to biopsy of the left breast mass in question. The pathology from this procedure showed ([id number]):  Invasive mammary carcinoma, grade 1. E-cadherin is positive supporting Ductal carcinoma diagnosis. Prognostic indicators significant for: estrogen receptor, 100% positive and progesterone receptor, 100% positive, both with strong staining intensity. Proliferation marker Ki67 at 5%. HER2 not amplified with ratios of HER2/CEP17 signals 1.21 and average copies per cell 2.00.  The patient's subsequent history is as detailed below.   PAST MEDICAL HISTORY: Past Medical History:  Diagnosis Date  . Abdominal aneurysm  ([place])  Dr. Oneida Alar follows lLOV 2 ''17 per pt "around 2 cm"  . Anemia    as a child  . Arthritis    left ankle, right knee, right SI joint, wrists, lower back  . Breast cancer in female [facility])    Left  . COPD (chronic obstructive pulmonary disease) ([place])    ephysema-Dr. Chase Caller  . Dyspnea   . Headache    as a child would have terrible headaches during season changes  . 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 Hyperlipidemia   . Hypertension   . Multiple thyroid nodules   . Murmur, cardiac 1950  . Osteoporosis   . Pneumonia   . Pre-diabetes   . Requires continuous at home supplemental oxygen    2 L 24/7  . Sebaceous cyst    hairline sebaceous cyst left posterior neck to be excised 04-13-16 by Dr. Harlow Asa in [place] hospital  . Sleep apnea    cpap used sometimes, uses oxygen concentrator 2 l/m nasally bedtime  . Varicella as child    PAST SURGICAL HISTORY: Past Surgical History:  Procedure Laterality Date  . APPENDECTOMY     2008  . BREAST LUMPECTOMY Left 08/31/2017  . BREAST LUMPECTOMY WITH RADIOACTIVE SEED LOCALIZATION Left 08/10/2017   Procedure: BREAST LUMPECTOMY WITH RADIOACTIVE SEED LOCALIZATION;  Surgeon: Rolm Bookbinder, MD;  Location: [facility];  Service: General;  Laterality: Left;  . [place]  . COLONOSCOPY    . COLONOSCOPY WITH PROPOFOL N/A 04/16/2016   Procedure: COLONOSCOPY WITH PROPOFOL;  Surgeon: Carol Ada, MD;  Location: [facility];  Service: Endoscopy;  Laterality: N/A;  . CYST REMOVAL NECK Left 04/13/2016   Procedure: EXCISION OF SEBACEOUS CYST LEFT POSTERIOR NECK;  Surgeon: Armandina Gemma, MD;  Location: [facility];  Service: General;  Laterality: Left;  . Excision of Pelvic Absess, Right Ovary     2008  . RE-EXCISION OF BREAST CANCER,SUPERIOR MARGINS Left 08/31/2017   Procedure: RE-EXCISION OF LEFT  BREAST MARGINS ERAS PATHWAY;  Surgeon: Rolm Bookbinder, MD;  Location: [facility];  Service: General;  Laterality: Left;  . TUBAL LIGATION   1980    FAMILY HISTORY Family History  Problem Relation Age of Onset  . Heart disease Mother        MI - fatal  . Hypertension Mother   . Stroke Father 45       fatal  . Alzheimer's disease Father   . Alzheimer's disease Brother   . Hyperlipidemia Brother   . Hypertension Brother   . Diabetes Brother   . Hypertension Brother   . Hyperlipidemia Brother   . Breast cancer Paternal Grandmother   The patient's father died at age 60 due to a stroke and prostate cancer.  The patient's mother died at age 5 due to a heart attack and blood clot. The patient has 3 brothers and no sisters. She notes a paternal grandmother who was diagnosed with breast cancer at age 64. She notes a paternal cousin with lung cancer. She denies a family history of ovarian cancer.   GYNECOLOGIC HISTORY:  No LMP recorded. Patient is postmenopausal. Menarche: 75 years old Age at first live birth: 75 years old [place] P1 LMP: age 22 The patient had a right ovarian cyst which required removal. She notes that she still has her left ovary. She was on HRT for a short amount of time, but discontinued due to negative side affects. She was placed on [employer]  as well.    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 She is divorced. The patient's son, Elice Crigger, is disabled and works part-time at [employer] in [place]. The patient has 3 grandchildren and 3 great-grandchildren. She attends [employer].      ADVANCED DIRECTIVES:  Son, Eliseo Gum and daughter in law, Butch Penny   HEALTH MAINTENANCE: Social History   Tobacco Use  . Smoking status: Former Smoker    Packs/day: 1.00    Years: 50.00    Pack years: 50.00    Types: Cigarettes    Quit date: 04/16/2011    Years since quitting: 9.4  . Smokeless tobacco: Never Used   . Tobacco comment: quit that date when she had to go to ER   Vaping Use  . Vaping Use: Never used  Substance Use Topics  . Alcohol use: Yes    Alcohol/week: 0.0 standard drinks    Comment: rare occasion  . Drug use: No    Comment: no marijuana since 2012     Colonoscopy: December 2017/ Dr. Benson Norway  PAP: September 2016  Bone density: 12/08/2016 showed a T score of -2.4   Allergies  Allergen Reactions  . Benicar [Olmesartan] Swelling    Swelling of face and arms   . [name] [Valsartan] Swelling    Swelling of face and arms   . Hydrocodone-Acetaminophen Nausea And Vomiting    Severe vomiting  . Lisinopril Cough  . Monosodium Glutamate Other (See Comments)    Facial swelling per pt  . Codeine Other (See Comments)    jittery  . Lead Acetate Rash  . Nickel Rash    Severe rash to infection: pt is allergic to all metals other than sterling silver or gold jewelry.     Current Outpatient Medications  Medication Sig Dispense Refill  . Accu-Chek Softclix Lancets lancets USE TO check BLOOD GLUCOSE ONCE DAILY 100 each 0  . acetaminophen (TYLENOL) 500 MG tablet Take 1,000 mg by mouth every 6 (six) hours as needed.    [name] albuterol (PROVENTIL) (2.5 MG/3ML) 0.083% nebulizer solution Take 3 mLs (2.5 mg total) by nebulization every 6 (six) hours as needed for wheezing or shortness of breath. 75 mL 3  . Albuterol Sulfate (PROAIR RESPICLICK) 801 (90 Base) MCG/ACT AEPB Inhale 1-2 puffs into the lungs every 6 (six) hours as needed (for wheezing/shortness of breath). 1 each 5  . amLODipine (NORVASC) 10 MG tablet Take 1 tablet (10 mg total) by mouth daily. 90 tablet 1  . anastrozole (ARIMIDEX) 1 MG tablet Take 1 tablet by mouth once daily 90 tablet 0  . aspirin EC 81 MG tablet Take 1 tablet (81 mg total) by mouth daily. 90 tablet 3  . azithromycin (ZITHROMAX) 250 MG tablet Take 2 tablets on first day, then take 1 tablet daily until finished. 6 tablet 0  . Blood Glucose Monitoring Suppl (ACCU-CHEK AVIVA  PLUS) w/Device KIT 1 Device by Does not apply route daily. Use to monitor glucose levels once per day; E11.9 1 kit 0  . carvedilol (COREG) 6.25 MG tablet Take 1 tablet (6.25 mg total) by mouth 2 (two) times daily. 180 tablet 3  . Cholecalciferol (VITAMIN D) 125 MCG (5000 UT) CAPS Take 1 capsule by mouth daily.    [name] denosumab (PROLIA) 60 MG/ML SOLN injection Inject 60 mg into the skin  every 6 (six) months. Administer in upper arm, thigh, or abdomen    . Fluticasone-Umeclidin-Vilant (TRELEGY ELLIPTA) 100-62.5-25 MCG/INH AEPB INHALE 1 DOSE INTO THE LUNGS DAILY. 60 each 5  . glucose blood (ACCU-CHEK GUIDE) test strip USE 1 STRIP ONCE DAILY 100 each 0  . glucose blood test strip USE 1 STRIP ONCE DAILY 100 each 12  . OXYGEN Inhale 2-3 L into the lungs continuous. When exerting self    . potassium chloride SA (KLOR-CON) 20 MEQ tablet Take 1 tablet (20 mEq total) by mouth 2 (two) times daily. 180 tablet 0  . predniSONE (DELTASONE) 10 MG tablet Take 4 tabs x1 day, 3 tabs x1 day, 2 tabs x1 day, 1 tab x1 day and then 1/2 tab x1 day. 11 tablet 0  . roflumilast (DALIRESP) 500 MCG TABS tablet Take 1 tablet (500 mcg total) by mouth daily. 90 tablet 1  . rosuvastatin (CRESTOR) 40 MG tablet Take 1 tablet (40 mg total) by mouth daily. 90 tablet 1  . SitaGLIPtin-MetFORMIN HCl (JANUMET XR) 50-1000 MG TB24 Take 2 tablets by mouth daily. 180 tablet 3  . spironolactone (ALDACTONE) 25 MG tablet Take 1 tablet (25 mg total) by mouth daily. 90 tablet 0  . thiamine (VITAMIN B-1) 100 MG tablet Take 1 tablet (100 mg total) by mouth every other day. 45 tablet 1   No current facility-administered medications for this visit.    OBJECTIVE: African-American woman examined in a wheelchair  Vitals:   10/07/20 1558  BP: (!) 159/60  Pulse: 75  Resp: 18  Temp: (!) 97.5 F (36.4 C)  SpO2: 95%     Body mass index is 34.95 kg/m.   Wt Readings from Last 3 Encounters:  10/07/20 206 lb 12.8 oz (93.8 kg)  08/21/20 213 lb (96.6  kg)  04/30/20 215 lb (97.5 kg)  ECOG FS:2 - Symptomatic, <50% confined to bed  Sclerae unicteric, EOMs intact Using O2 by nasal cannula No cervical or supraclavicular adenopathy Lungs no rales or rhonchi Heart regular rate and rhythm Abd soft, nontender, positive bowel sounds MSK no focal spinal tenderness, no upper extremity lymphedema Neuro: nonfocal, well oriented, appropriate affect Breasts: The right breast is benign.  The left breast is status post lumpectomy.  There is no evidence of local recurrence.  Both axillae are benign   LAB RESULTS:  CMP     Component Value Date/Time   NA 138 12/24/2019 1420   NA 139 01/08/2019 1025   K 4.4 12/24/2019 1420   CL 101 12/24/2019 1420   CO2 32 12/24/2019 1420   GLUCOSE 137 (H) 12/24/2019 1420   BUN 13 12/24/2019 1420   BUN 15 01/08/2019 1025   CREATININE 0.74 12/24/2019 1420   CALCIUM 9.7 12/24/2019 1420   PROT 6.9 09/19/2019 1110   PROT 6.6 01/08/2019 1025   ALBUMIN 3.3 (L) 09/19/2019 1110   ALBUMIN 4.1 01/08/2019 1025   AST 10 (L) 09/19/2019 1110   AST 11 (L) 12/11/2018 1403   ALT <6 09/19/2019 1110   ALT 7 12/11/2018 1403   ALKPHOS 92 09/19/2019 1110   BILITOT 0.3 09/19/2019 1110   BILITOT 0.3 01/08/2019 1025   BILITOT 0.3 12/11/2018 1403   GFRNONAA 79 12/24/2019 1420   GFRAA 92 12/24/2019 1420    Lab Results  Component Value Date   TOTALPROTELP 6.7 02/28/2014   ALBUMINELP 57.4 02/28/2014   A1GS 4.9 02/28/2014   A2GS 8.5 02/28/2014   BETS 7.5 (H) 02/28/2014   BETA2SER 5.3 02/28/2014  GAMS 16.4 02/28/2014   MSPIKE 0.31 02/28/2014   SPEI SEE NOTE 02/28/2014    No results found for: KPAFRELGTCHN, LAMBDASER, [facility]  Lab Results  Component Value Date   WBC 9.4 10/07/2020   NEUTROABS 6.5 10/07/2020   HGB 12.0 10/07/2020   HCT 37.9 10/07/2020   MCV 87.5 10/07/2020   PLT 248 10/07/2020   No results found for: LABCA2  No components found for: PXTGGY694  No results for input(s): INR in the last 168  hours.  No results found for: LABCA2  No results found for: WNI627  No results found for: OJJ009  No results found for: FGH829  No results found for: CA2729  No components found for: HGQUANT  No results found for: CEA1 / No results found for: CEA1   No results found for: AFPTUMOR  No results found for: CHROMOGRNA  No results found for: HGBA, HGBA2QUANT, HGBFQUANT, HGBSQUAN (Hemoglobinopathy evaluation)   No results found for: LDH  Lab Results  Component Value Date   IRON 59 04/19/2019   IRONPCTSAT 16.4 (L) 04/19/2019   (Iron and TIBC)  Lab Results  Component Value Date   FERRITIN 84.2 04/19/2019    Urinalysis    Component Value Date/Time   COLORURINE YELLOW 04/19/2019 [place] 04/19/2019 1208   LABSPEC 1.015 04/19/2019 1208   PHURINE 6.5 04/19/2019 1208   GLUCOSEU NEGATIVE 04/19/2019 1208   HGBUR TRACE-INTACT (A) 04/19/2019 [place] 04/19/2019 [place] 04/19/2019 1208   PROTEINUR 30 (A) 04/18/2011 1942   UROBILINOGEN 0.2 04/19/2019 1208   NITRITE NEGATIVE 04/19/2019 1208   LEUKOCYTESUR SMALL (A) 04/19/2019 1208    STUDIES: No results found.   ELIGIBLE FOR AVAILABLE RESEARCH PROTOCOL: no  ASSESSMENT: 75 y.o. [place] woman status post left breast upper outer quadrant biopsy 06/24/2017 for a clinical T1b N0, stage IA invasive ductal carcinoma, grade 1, estrogen and progesterone receptor positive, HER-2 not amplified, with an MIB-105%  (1) status post left lumpectomy without sentinel lymph node sampling on 08/10/2017 showed invasive ductal carcinoma grade II, spanning 1.2 cm. High grade DCIS. Anterior margin was was focally positive for invasive ductal carcinoma. All other margins clear.   (a) additional surgery for margin clearance 08/31/2017 showed atypical ductal hyperplasia, negative margins  (2) given the patient's age, the tumor size, grade and prognostic profile, will forego Oncotype and avoid  chemotherapy  (3) may forgo adjuvant radiation if comfortable with anti-estrogen therapy  (4) anastrozole started 08/30/2017  (5) osteoporosis: bone density on 02/27/2014 showed a T score of -2.9   (a) bone density on 12/08/2016 showed a T score of -2.4 (osteopenia).  (b) bone density 04/10/2019 shows a T score of -2.2  (6) lung cancer screening: Initiated June 2019  (a) most recent scan 07/15/2020 benign  (b)  incidentally noted adrenal adenomas stable   PLAN: Tatyanna is now a little over 3 years out from definitive surgery for her breast cancer with no evidence of disease recurrence.  This is very favorable.  She is tolerating anastrozole well and the plan is to continue that a total of 5 years.  She receives Prolia through her primary care physician and that is helping her bone density, which is improved  She has excellent pulmonary follow-up through Dr. Chase Caller  She will see [place] again in 1 year.  She knows to call for any other issue that may develop before then  Total encounter time 25 minutes.Sarajane Jews C. Aliviah Spain, MD 10/07/20  4:25 PM Medical Oncology and Hematology [place], [place] [address] Tel. [phone]    Fax. [phone]    I, Wilburn Mylar, am acting as scribe for Dr. Virgie Dad. Arvind Mexicano.  I, Lurline Del MD, have reviewed the above documentation for accuracy and completeness, and I agree with the above.   *Total Encounter Time as defined by the Centers for Medicare and Medicaid Services includes, in addition to the face-to-face time of a patient visit (documented in the note above) non-face-to-face time: obtaining and reviewing outside history, ordering and reviewing medications, tests or procedures, care coordination (communications with other health care professionals or caregivers) and documentation in the medical record.

## 2020-10-07 ENCOUNTER — Inpatient Hospital Stay: Payer: Medicare Other | Attending: Oncology | Admitting: Oncology

## 2020-10-07 ENCOUNTER — Other Ambulatory Visit: Payer: Self-pay

## 2020-10-07 ENCOUNTER — Inpatient Hospital Stay: Payer: Medicare Other

## 2020-10-07 VITALS — BP 159/60 | HR 75 | Temp 97.5°F | Resp 18 | Ht 64.5 in | Wt 206.8 lb

## 2020-10-07 DIAGNOSIS — C50412 Malignant neoplasm of upper-outer quadrant of left female breast: Secondary | ICD-10-CM

## 2020-10-07 DIAGNOSIS — M810 Age-related osteoporosis without current pathological fracture: Secondary | ICD-10-CM | POA: Insufficient documentation

## 2020-10-07 DIAGNOSIS — Z170 Estrogen receptor positive status [ER+]: Secondary | ICD-10-CM | POA: Diagnosis not present

## 2020-10-07 DIAGNOSIS — M818 Other osteoporosis without current pathological fracture: Secondary | ICD-10-CM

## 2020-10-07 DIAGNOSIS — Z87891 Personal history of nicotine dependence: Secondary | ICD-10-CM | POA: Diagnosis not present

## 2020-10-07 DIAGNOSIS — Z79811 Long term (current) use of aromatase inhibitors: Secondary | ICD-10-CM | POA: Insufficient documentation

## 2020-10-07 LAB — COMPREHENSIVE METABOLIC PANEL
ALT: <6 U/L (ref 0–44)
AST: 11 U/L — ABNORMAL LOW (ref 15–41)
Albumin: 3.4 g/dL — ABNORMAL LOW (ref 3.5–5.0)
Alkaline Phosphatase: 77 U/L (ref 38–126)
Anion gap: 8 (ref 5–15)
BUN: 16 mg/dL (ref 8–23)
CO2: 29 mmol/L (ref 22–32)
Calcium: 9.9 mg/dL (ref 8.9–10.3)
Chloride: 101 mmol/L (ref 98–111)
Creatinine, Ser: 0.73 mg/dL (ref 0.44–1.00)
GFR, Estimated: >60 mL/min (ref >60)
Glucose, Bld: 100 mg/dL — ABNORMAL HIGH (ref 70–99)
Potassium: 4.3 mmol/L (ref 3.5–5.1)
Sodium: 138 mmol/L (ref 135–145)
Total Bilirubin: 0.3 mg/dL (ref 0.3–1.2)
Total Protein: 7 g/dL (ref 6.5–8.1)

## 2020-10-07 LAB — CBC WITH DIFFERENTIAL/PLATELET
Abs Immature Granulocytes: 0.03 10*3/uL (ref 0.00–0.07)
Basophils Absolute: 0.1 10*3/uL (ref 0.0–0.1)
Basophils Relative: 1 %
Eosinophils Absolute: 0.2 10*3/uL (ref 0.0–0.5)
Eosinophils Relative: 2 %
HCT: 37.9 % (ref 36.0–46.0)
Hemoglobin: 12 g/dL (ref 12.0–15.0)
Immature Granulocytes: 0 %
Lymphocytes Relative: 20 %
Lymphs Abs: 1.9 10*3/uL (ref 0.7–4.0)
MCH: 27.7 pg (ref 26.0–34.0)
MCHC: 31.7 g/dL (ref 30.0–36.0)
MCV: 87.5 fL (ref 80.0–100.0)
Monocytes Absolute: 0.7 10*3/uL (ref 0.1–1.0)
Monocytes Relative: 8 %
Neutro Abs: 6.5 10*3/uL (ref 1.7–7.7)
Neutrophils Relative %: 69 %
Platelets: 248 10*3/uL (ref 150–400)
RBC: 4.33 MIL/uL (ref 3.87–5.11)
RDW: 14.7 % (ref 11.5–15.5)
WBC: 9.4 10*3/uL (ref 4.0–10.5)
nRBC: 0 % (ref 0.0–0.2)

## 2020-10-08 ENCOUNTER — Telehealth: Payer: Medicare Other

## 2020-10-11 DIAGNOSIS — J449 Chronic obstructive pulmonary disease, unspecified: Secondary | ICD-10-CM | POA: Diagnosis not present

## 2020-10-14 NOTE — Progress Notes (Signed)
    Chronic Care Management Pharmacy Assistant   Name: Victoria [name]  MRN: [id number] DOB: [birth year]   Reason for Encounter: Chart Review    Medications: Outpatient Encounter Medications as of 09/22/2020  Medication Sig  . acetaminophen (TYLENOL) 500 MG tablet Take 1,000 mg by mouth every 6 (six) hours as needed.  [name] albuterol (PROVENTIL) (2.5 MG/3ML) 0.083% nebulizer solution Take 3 mLs (2.5 mg total) by nebulization every 6 (six) hours as needed for wheezing or shortness of breath.  . Albuterol Sulfate (PROAIR RESPICLICK) 801 (90 Base) MCG/ACT AEPB Inhale 1-2 puffs into the lungs every 6 (six) hours as needed (for wheezing/shortness of breath).  [name] amLODipine (NORVASC) 10 MG tablet Take 1 tablet (10 mg total) by mouth daily.  [name] anastrozole (ARIMIDEX) 1 MG tablet Take 1 tablet by mouth once daily  . aspirin EC 81 MG tablet Take 1 tablet (81 mg total) by mouth daily.  [name] azithromycin (ZITHROMAX) 250 MG tablet Take 2 tablets on first day, then take 1 tablet daily until finished.  . Blood Glucose Monitoring Suppl (ACCU-CHEK AVIVA PLUS) w/Device KIT 1 Device by Does not apply route daily. Use to monitor glucose levels once per day; E11.9  . carvedilol (COREG) 6.25 MG tablet Take 1 tablet (6.25 mg total) by mouth 2 (two) times daily.  . Cholecalciferol (VITAMIN D) 125 MCG (5000 UT) CAPS Take 1 capsule by mouth daily.  [name] denosumab (PROLIA) 60 MG/ML SOLN injection Inject 60 mg into the skin every 6 (six) months. Administer in upper arm, thigh, or abdomen  . Fluticasone-Umeclidin-Vilant (TRELEGY ELLIPTA) 100-62.5-25 MCG/INH AEPB INHALE 1 DOSE INTO THE LUNGS DAILY.  [name] glucose blood (ACCU-CHEK GUIDE) test strip USE 1 STRIP ONCE DAILY  . glucose blood test strip USE 1 STRIP ONCE DAILY  . OXYGEN Inhale 2-3 L into the lungs continuous. When exerting self  . potassium chloride SA (KLOR-CON) 20 MEQ tablet Take 1 tablet (20 mEq total) by mouth 2 (two) times daily.  . predniSONE (DELTASONE) 10 MG  tablet Take 4 tabs x1 day, 3 tabs x1 day, 2 tabs x1 day, 1 tab x1 day and then 1/2 tab x1 day.  . roflumilast (DALIRESP) 500 MCG TABS tablet Take 1 tablet (500 mcg total) by mouth daily.  . rosuvastatin (CRESTOR) 40 MG tablet Take 1 tablet (40 mg total) by mouth daily.  . SitaGLIPtin-MetFORMIN HCl (JANUMET XR) 50-1000 MG TB24 Take 2 tablets by mouth daily.  [name] spironolactone (ALDACTONE) 25 MG tablet Take 1 tablet (25 mg total) by mouth daily.  [name] thiamine (VITAMIN B-1) 100 MG tablet Take 1 tablet (100 mg total) by mouth every other day.  . [DISCONTINUED] Accu-Chek Softclix Lancets lancets USE 1  TO CHECK GLUCOSE ONCE DAILY   No facility-administered encounter medications on file as of 09/22/2020.    Pharmacist Review  Reviewed chart for medication changes and adherence.  Recent OV, Consult or Hospital visit: Patient last seen Dr. Sarajane Jews 10/07/20 No medication changes indicated  No gaps in adherence identified. Patient has follow up scheduled with pharmacy team. No further action required.  [place] Pharmacist Assistant [phone]  Time spent:9

## 2020-10-24 ENCOUNTER — Telehealth: Payer: Self-pay | Admitting: Pharmacist

## 2020-10-24 ENCOUNTER — Telehealth: Payer: Self-pay

## 2020-10-24 NOTE — Progress Notes (Signed)
Chronic Care Management Pharmacy Assistant   Name: Victoria [name]  MRN: [id number] DOB: Oct 02, 1945   Reason for Encounter: Medication Review    Recent office visits:  09/17/20 Dr. Jana Hakim, Oncology  Recent consult visits:  None ID  Hospital visits:  None in previous 6 months  Medications: Outpatient Encounter Medications as of 10/24/2020  Medication Sig   Accu-Chek Softclix Lancets lancets USE TO check BLOOD GLUCOSE ONCE DAILY   acetaminophen (TYLENOL) 500 MG tablet Take 1,000 mg by mouth every 6 (six) hours as needed.   albuterol (PROVENTIL) (2.5 MG/3ML) 0.083% nebulizer solution Take 3 mLs (2.5 mg total) by nebulization every 6 (six) hours as needed for wheezing or shortness of breath.   Albuterol Sulfate (PROAIR RESPICLICK) 814 (90 Base) MCG/ACT AEPB Inhale 1-2 puffs into the lungs every 6 (six) hours as needed (for wheezing/shortness of breath).   amLODipine (NORVASC) 10 MG tablet Take 1 tablet (10 mg total) by mouth daily.   anastrozole (ARIMIDEX) 1 MG tablet Take 1 tablet by mouth once daily   aspirin EC 81 MG tablet Take 1 tablet (81 mg total) by mouth daily.   azithromycin (ZITHROMAX) 250 MG tablet Take 2 tablets on first day, then take 1 tablet daily until finished.   Blood Glucose Monitoring Suppl (ACCU-CHEK AVIVA PLUS) w/Device KIT 1 Device by Does not apply route daily. Use to monitor glucose levels once per day; E11.9   carvedilol (COREG) 6.25 MG tablet Take 1 tablet (6.25 mg total) by mouth 2 (two) times daily.   Cholecalciferol (VITAMIN D) 125 MCG (5000 UT) CAPS Take 1 capsule by mouth daily.   denosumab (PROLIA) 60 MG/ML SOLN injection Inject 60 mg into the skin every 6 (six) months. Administer in upper arm, thigh, or abdomen   Fluticasone-Umeclidin-Vilant (TRELEGY ELLIPTA) 100-62.5-25 MCG/INH AEPB INHALE 1 DOSE INTO THE LUNGS DAILY.   glucose blood (ACCU-CHEK GUIDE) test strip USE 1 STRIP ONCE DAILY   glucose blood test strip USE 1 STRIP ONCE DAILY    OXYGEN Inhale 2-3 L into the lungs continuous. When exerting self   potassium chloride SA (KLOR-CON) 20 MEQ tablet Take 1 tablet (20 mEq total) by mouth 2 (two) times daily.   predniSONE (DELTASONE) 10 MG tablet Take 4 tabs x1 day, 3 tabs x1 day, 2 tabs x1 day, 1 tab x1 day and then 1/2 tab x1 day.   roflumilast (DALIRESP) 500 MCG TABS tablet Take 1 tablet (500 mcg total) by mouth daily.   rosuvastatin (CRESTOR) 40 MG tablet Take 1 tablet (40 mg total) by mouth daily.   SitaGLIPtin-MetFORMIN HCl (JANUMET XR) 50-1000 MG TB24 Take 2 tablets by mouth daily.   spironolactone (ALDACTONE) 25 MG tablet Take 1 tablet (25 mg total) by mouth daily.   thiamine (VITAMIN B-1) 100 MG tablet Take 1 tablet (100 mg total) by mouth every other day.   No facility-administered encounter medications on file as of 10/24/2020.   Pharmacist Review  Reviewed chart for medication changes ahead of medication coordination call.  No OVs, Consults, or hospital visits since last care coordination call/Pharmacist visit. (If appropriate, list visit date, provider name)  No medication changes indicated OR if recent visit, treatment plan here.  BP Readings from Last 3 Encounters:  10/07/20 (!) 159/60  08/21/20 (!) 190/90  04/30/20 (!) 126/58    Lab Results  Component Value Date   HGBA1C 6.4 (A) 08/21/2020     Patient obtains medications through Vials  30 Days   Last adherence delivery  included:  Amlodipine 10 mg - breakfast Anastrozole 1 mg - breakfast Potassium cl 20 meq - breakfast Spironolactone 25 mg - breakfast B complex - breakfast Vitamin D3 - breakfast    Patient is due for next adherence delivery on: 11/03/20. Called patient and reviewed medications and coordinated delivery.  This delivery to include: Amlodipine 10 mg - breakfast Anastrozole 1 mg - breakfast Spironolactone 25 mg - breakfast B complex - breakfast Vitamin D3 - breakfast Rosuvastatin 40 mg daily Janumet 50-1059m 2 tabs at  breakfast Daliresp 5014m daily     Patient declined the following medications trelegy, patient states that she has one left and another one she has not use yet. She does not need any potassium plenty on hand  Patient needs refills for None ID.  Confirmed delivery date of 11/03/20, advised patient that pharmacy will contact them the morning of delivery.    Star Rating Drugs: Rosuvastatin 08/07/20 90 d  TrEthelene Hallinical Pharmacist Assistant [phone] Time spent:51

## 2020-10-24 NOTE — Telephone Encounter (Signed)
Pt called to obtain lab results from 10/07/2020. Lab results reviewed with pt. Verbalized thanks and understanding.

## 2020-10-28 ENCOUNTER — Other Ambulatory Visit: Payer: Self-pay

## 2020-10-28 ENCOUNTER — Other Ambulatory Visit: Payer: Self-pay | Admitting: Internal Medicine

## 2020-10-28 ENCOUNTER — Telehealth: Payer: Self-pay | Admitting: Internal Medicine

## 2020-10-28 NOTE — Progress Notes (Signed)
Erroneous encounter

## 2020-10-28 NOTE — Telephone Encounter (Signed)
I called and spoke with patient who is wanting to look into getting a POC machine from [employer] due to how heavy portable tanks are. Notified patient that we will need to walk in office to qualify. Patient verbalized understanding and needs a f/u appt so made one for 11/18/20 with MR with walk. Patient verbalized understanding, nothing further needed.

## 2020-11-07 ENCOUNTER — Other Ambulatory Visit: Payer: Self-pay

## 2020-11-07 ENCOUNTER — Ambulatory Visit (INDEPENDENT_AMBULATORY_CARE_PROVIDER_SITE_OTHER): Payer: Medicare Other | Admitting: Pharmacist

## 2020-11-07 DIAGNOSIS — E785 Hyperlipidemia, unspecified: Secondary | ICD-10-CM

## 2020-11-07 DIAGNOSIS — E118 Type 2 diabetes mellitus with unspecified complications: Secondary | ICD-10-CM | POA: Diagnosis not present

## 2020-11-07 DIAGNOSIS — J449 Chronic obstructive pulmonary disease, unspecified: Secondary | ICD-10-CM

## 2020-11-07 DIAGNOSIS — I10 Essential (primary) hypertension: Secondary | ICD-10-CM | POA: Diagnosis not present

## 2020-11-07 DIAGNOSIS — I2510 Atherosclerotic heart disease of native coronary artery without angina pectoris: Secondary | ICD-10-CM | POA: Diagnosis not present

## 2020-11-07 NOTE — Patient Instructions (Signed)
Visit Information  Phone number for Pharmacist: [phone]   Goals Addressed             This Visit's Progress    Manage My Medicine       Timeframe:  Long-Range Goal Priority:  Medium Start Date:    11/07/20                         Expected End Date:      11/07/21                 Follow Up Date Dec 2022   - call for medicine refill 2 or 3 days before it runs out - call if I am sick and can't take my medicine - keep a list of all the medicines I take; vitamins and herbals too - use a pillbox to sort medicine    Why is this important?   These steps will help you keep on track with your medicines.   Notes:          The patient verbalized understanding of instructions, educational materials, and care plan provided today and declined offer to receive copy of patient instructions, educational materials, and care plan.  Telephone follow up appointment with pharmacy team member scheduled for: 6 months  Charlene Brooke, PharmD, [place], CPP Clinical Pharmacist [facility] [phone]

## 2020-11-07 NOTE — Progress Notes (Signed)
Chronic Care Management Pharmacy Note  11/07/2020 Name:  Victoria [name] MRN:  [id number] DOB:  1945/06/20  Summary: -Pt reports compliance with new rosuvastatin; fasting BG 100-123, denies issues -Pt reports she got 2 Shingles vaccines and 1 COVID booster [facility]  Recommendations/Changes made from today's visit: -Recommend to continue current medication -Get vaccine dates from [place]   Subjective: Victoria [name] is an 75 y.o. year old female who is a primary patient of Janith Lima, MD.  The CCM team was consulted for assistance with disease management and care coordination needs.    Engaged with patient by telephone for follow up visit in response to provider referral for pharmacy case management and/or care coordination services.   Consent to Services:  The patient was given information about Chronic Care Management services, agreed to services, and gave verbal consent prior to initiation of services.  Please see initial visit note for detailed documentation.   Patient Care Team: Janith Lima, MD as PCP - General (Internal Medicine) Troy Sine, MD as PCP - Cardiology (Cardiology) Brand Males, MD as Consulting Physician (Pulmonary Disease) Arvella Nigh, MD as Consulting Physician (Obstetrics and Gynecology) Magrinat, Virgie Dad, MD as Consulting Physician (Oncology) Rolm Bookbinder, MD as Consulting Physician (General Surgery) Gery Pray, MD as Consulting Physician (Radiation Oncology) Troy Sine, MD as Consulting Physician (Cardiology) Causey, Charlestine Massed, NP as Nurse Practitioner (Hematology and Oncology) Parrett, Fonnie Mu, NP as Nurse Practitioner (Pulmonary Disease) Renato Shin, MD as Consulting Physician (Endocrinology) Charlton Haws, [facility] as Pharmacist (Pharmacist)   Patient is originally from [place], moved to [place] in 1977. Moved to [place] in 1982, and came back to [place] in 1983. Pt is semi-retired from [employer]. 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 She is also active in her church. She has 1 son, 3 grandchildren, 3 great-grandchildren. She lives by herself, she has someone come to help clean.   Recent office visits: 12/24/19 Dr Ronnald Ramp OV: CPX. Labs stable. No med changes.  Recent consult visits: 10/07/20 Dr Jana Hakim (oncology): f/u breast cancer  08/21/20 Dr Loanne Drilling (endocrine): f/u DM, TSH. No med changes. A1c 6.4  04/30/20 NP Tammy Parrett (pulmonary): rx'd albuterol nebulizer.  05/03/20 Dr Claiborne Billings (cardiology): increased carvedilol to 6.25 mg BID. LDL goal < 70, tolerating atorvastatin 80 mg.  03/31/20 NP Wilber Bihari (heme/onc): f/u for breast cancer. Dx 06/2017. S/p L lumpectomy. Tx anastrozole May 2019-2024.   12/20/19 NP Tammy Parrett (pulmonary): f/u COPD, OSA. Restart CPAP. No med changes. Continue O2   12/07/19 Dr Loanne Drilling (endocrine): well controlled DM, no med changes.  Hospital visits: None in previous 6 months  Objective:  Lab Results  Component Value Date   CREATININE 0.73 10/07/2020   BUN 16 10/07/2020   GFR 92.48 06/20/2019   GFRNONAA >60 10/07/2020   GFRAA 92 12/24/2019   NA 138 10/07/2020   K 4.3 10/07/2020   CALCIUM 9.9 10/07/2020   CO2 29 10/07/2020   GLUCOSE 100 (H) 10/07/2020    Lab Results  Component Value Date/Time   HGBA1C 6.4 (A) 08/21/2020 11:53 AM   HGBA1C 6.8 (A) 04/18/2020 11:35 AM   HGBA1C 7.3 (H) 08/31/2017 07:11 AM   HGBA1C 7.1 (H) 06/23/2016 03:36 PM   FRUCTOSAMINE 238 01/09/2019 11:20 AM   GFR 92.48 06/20/2019 11:50 AM   GFR 97.05 04/19/2019 10:09 AM   MICROALBUR 1.4 04/19/2019 12:08 PM  MICROALBUR 1.3 01/18/2017 04:05 PM    Last diabetic Eye exam:  Lab Results  Component Value Date/Time   HMDIABEYEEXA No Retinopathy 03/28/2019 12:00 AM    Last diabetic Foot exam: No results found for: HMDIABFOOTEX   Lab Results  Component Value Date    CHOL 170 12/24/2019   HDL 48 (L) 12/24/2019   LDLCALC 92 12/24/2019   LDLDIRECT 121.0 09/05/2014   TRIG 201 (H) 12/24/2019   CHOLHDL 3.5 12/24/2019    Hepatic Function Latest Ref Rng & Units 10/07/2020 09/19/2019 03/21/2019  Total Protein 6.5 - 8.1 g/dL 7.0 6.9 7.1  Albumin 3.5 - 5.0 g/dL 3.4(L) 3.3(L) 3.8  AST 15 - 41 U/L 11(L) 10(L) 13(L)  ALT 0 - 44 U/L <6 <6 8  Alk Phosphatase 38 - 126 U/L 77 92 84  Total Bilirubin 0.3 - 1.2 mg/dL 0.3 0.3 0.4  Bilirubin, Direct 0.0 - 0.3 mg/dL - - -    Lab Results  Component Value Date/Time   TSH 2.21 08/21/2020 01:39 PM   TSH 2.20 04/18/2020 11:44 AM   FREET4 0.80 08/21/2020 01:39 PM   FREET4 0.68 04/18/2020 11:44 AM    CBC Latest Ref Rng & Units 10/07/2020 12/24/2019 09/19/2019  WBC 4.0 - 10.5 K/uL 9.4 8.3 9.4  Hemoglobin 12.0 - 15.0 g/dL 12.0 11.7 11.4(L)  Hematocrit 36.0 - 46.0 % 37.9 35.5 35.0(L)  Platelets 150 - 400 K/uL 248 247 228    Lab Results  Component Value Date/Time   VD25OH 46.08 02/28/2014 08:55 AM    Clinical ASCVD: Yes  The 10-year ASCVD risk score Mikey Bussing DC Jr., et al., 2013) is: 36.9%   Values used to calculate the score:     Age: 1 years     Sex: Female     Is Non-Hispanic African American: Yes     Diabetic: Yes     Tobacco smoker: No     Systolic Blood Pressure: 709 mmHg     Is BP treated: Yes     HDL Cholesterol: 48 mg/dL     Total Cholesterol: 170 mg/dL    Depression screen [facility] 2/9 02/20/2020 04/19/2019 06/12/2018  Decreased Interest 0 0 0  Down, Depressed, Hopeless 1 0 0  PHQ - 2 Score 1 0 0  Altered sleeping - - -  Tired, decreased energy - - -  Change in appetite - - -  Feeling bad or failure about yourself  - - -  Trouble concentrating - - -  Moving slowly or fidgety/restless - - -  Suicidal thoughts - - -  PHQ-9 Score - - -  Difficult doing work/chores - - -  Some recent data might be hidden     Social History   Tobacco Use  Smoking Status Former   Packs/day: 1.00   Years: 50.00   Pack  years: 50.00   Types: Cigarettes   Quit date: 04/16/2011   Years since quitting: 9.5  Smokeless Tobacco Never  Tobacco Comments   quit that date when she had to go to ER    BP Readings from Last 3 Encounters:  10/07/20 (!) 159/60  08/21/20 (!) 190/90  04/30/20 (!) 126/58   Pulse Readings from Last 3 Encounters:  10/07/20 75  08/21/20 78  04/30/20 89   Wt Readings from Last 3 Encounters:  10/07/20 206 lb 12.8 oz (93.8 kg)  08/21/20 213 lb (96.6 kg)  04/30/20 215 lb (97.5 kg)   BMI Readings from Last 3 Encounters:  10/07/20 34.95 kg/m  08/21/20 36.00 kg/m  04/30/20 36.33 kg/m    Assessment/Interventions: Review of patient past medical history, allergies, medications, health status, including review of consultants reports, laboratory and other test data, was performed as part of comprehensive evaluation and provision of chronic care management services.   SDOH:  (Social Determinants of Health) assessments and interventions performed: Yes  SDOH Screenings   Alcohol Screen: Low Risk    Last Alcohol Screening Score (AUDIT): 0  Depression (PHQ2-9): Low Risk    PHQ-2 Score: 1  Financial Resource Strain: Low Risk    Difficulty of Paying Living Expenses: Not hard at all  Food Insecurity: No Food Insecurity   Worried About Charity fundraiser in the Last Year: Never true   Ran Out of Food in the Last Year: Never true  Housing: Low Risk    Last Housing Risk Score: 0  Physical Activity: Sufficiently Active   Days of Exercise per Week: 5 days   Minutes of Exercise per Session: 30 min  Social Connections: Engineer, building services of Communication with Friends and Family: More than three times a week   Frequency of Social Gatherings with Friends and Family: More than three times a week   Attends Religious Services: More than 4 times per year   Active Member of [employer] or Organizations: Yes   Attends Music therapist: More than 4 times per year   Marital  Status: Married  Stress: No Stress Concern Present   Feeling of Stress : Not at all  Tobacco Use: Medium Risk   Smoking Tobacco Use: Former   Smokeless Tobacco Use: Never  Transportation Needs: No Data processing manager (Medical): No   Lack of Transportation (Non-Medical): No    CCM Care Plan  Allergies  Allergen Reactions   Benicar [Olmesartan] Swelling    Swelling of face and arms    [name] [Valsartan] Swelling    Swelling of face and arms    Hydrocodone-Acetaminophen Nausea And Vomiting    Severe vomiting   Lisinopril Cough   Monosodium Glutamate Other (See Comments)    Facial swelling per pt   Codeine Other (See Comments)    jittery   Lead Acetate Rash   Nickel Rash    Severe rash to infection: pt is allergic to all metals other than sterling silver or gold jewelry.     Medications Reviewed Today     Reviewed by Charlton Haws, [facility] (Pharmacist) on 11/07/20 at 1343  Med List Status: <None>   Medication Order Taking? Sig Documenting Provider Last Dose Status Informant  Accu-Chek Softclix Lancets lancets [id number] Yes USE TO check BLOOD GLUCOSE ONCE DAILY Renato Shin, MD Taking Active   acetaminophen (TYLENOL) 500 MG tablet [id number] Yes Take 1,000 mg by mouth every 6 (six) hours as needed. [provider] Taking Active   albuterol (PROVENTIL) (2.5 MG/3ML) 0.083% nebulizer solution [id number] Yes Take 3 mLs (2.5 mg total) by nebulization every 6 (six) hours as needed for wheezing or shortness of breath. Parrett, Fonnie Mu, NP Taking Active   Albuterol Sulfate (PROAIR RESPICLICK) 509 (90 Base) MCG/ACT AEPB [id number] Yes Inhale 1-2 puffs into the lungs every 6 (six) hours as needed (for wheezing/shortness of breath). Brand Males, MD Taking Active   amLODipine (NORVASC) 10 MG tablet [id number] Yes Take 1 tablet (10 mg total) by mouth daily. Janith Lima, MD Taking Active   anastrozole (ARIMIDEX) 1 MG tablet [id number] Yes Take 1  tablet by mouth once  daily Magrinat, Virgie Dad, MD Taking Active   aspirin EC 81 MG tablet [id number] Yes Take 1 tablet (81 mg total) by mouth daily. Kroeger, Lorelee Cover., PA-C Taking Active   azithromycin (ZITHROMAX) 250 MG tablet [id number] Yes Take 2 tablets on first day, then take 1 tablet daily until finished. Brand Males, MD Taking Active   Blood Glucose Monitoring Suppl (ACCU-CHEK AVIVA PLUS) w/Device KIT [id number] Yes 1 Device by Does not apply route daily. Use to monitor glucose levels once per day; E11.9 Renato Shin, MD Taking Active   carvedilol (COREG) 6.25 MG tablet [id number] Yes Take 1 tablet (6.25 mg total) by mouth 2 (two) times daily. Troy Sine, MD Taking Active   Cholecalciferol (VITAMIN D) 125 MCG (5000 UT) CAPS [id number] Yes Take 1 capsule by mouth daily. [provider] Taking Active   denosumab (PROLIA) 60 MG/ML SOLN injection [id number] Yes Inject 60 mg into the skin every 6 (six) months. Administer in upper arm, thigh, or abdomen [provider] Taking Active Self  Fluticasone-Umeclidin-Vilant (TRELEGY ELLIPTA) 100-62.5-25 MCG/INH AEPB [id number] Yes INHALE 1 DOSE INTO THE LUNGS DAILY. Brand Males, MD Taking Active   glucose blood (ACCU-CHEK GUIDE) test strip [id number] Yes USE 1 STRIP ONCE DAILY Renato Shin, MD Taking Active   glucose blood test strip [id number] Yes USE 1 STRIP ONCE DAILY Renato Shin, MD Taking Active   OXYGEN [id number] Yes Inhale 2-3 L into the lungs continuous. When exerting self [provider] Taking Active Self  potassium chloride SA (KLOR-CON) 20 MEQ tablet [id number] Yes Take 1 tablet (20 mEq total) by mouth 2 (two) times daily. Janith Lima, MD Taking Active   roflumilast (DALIRESP) 500 MCG TABS tablet [id number] Yes Take 1 tablet (500 mcg total) by mouth daily. Brand Males, MD Taking Active   rosuvastatin (CRESTOR) 40 MG tablet [id number] Yes Take 1 tablet (40 mg total) by mouth daily. Janith Lima, MD  Taking Active   SitaGLIPtin-MetFORMIN HCl (JANUMET XR) 50-1000 MG TB24 [id number] Yes Take 2 tablets by mouth daily. Renato Shin, MD Taking Active   spironolactone (ALDACTONE) 25 MG tablet [id number] Yes Take 1 tablet (25 mg total) by mouth daily. Janith Lima, MD Taking Active   thiamine (VITAMIN B-1) 100 MG tablet [id number] Yes Take 1 tablet (100 mg total) by mouth every other day. Janith Lima, MD Taking Active             Patient Active Problem List   Diagnosis Date Noted   Lung nodules 04/30/2020   Thiamine deficiency 04/24/2019   Aortic atherosclerosis ([place]) 11/23/2017   Malignant neoplasm of upper-outer quadrant of left breast in female, estrogen receptor positive ([place]) 07/01/2017   Chronic respiratory failure with hypoxia ([place]) 02/07/2017   DDD (degenerative disc disease), lumbar 01/13/2017   Estrogen deficiency 10/20/2016   Type II diabetes mellitus with manifestations ([place]) 01/08/2016   Visit for screening mammogram 10/14/2015   Atherosclerosis of native coronary artery of native heart without angina pectoris 10/14/2015   History of positive PPD 10/08/2015   COPD, severe ([place]) 09/04/2015   AAA (abdominal aortic aneurysm) without rupture ([place]) 05/13/2015   Multiple thyroid nodules 05/13/2015   OSA (obstructive sleep apnea) 11/15/2014   Essential hypertension 03/11/2014   Emphysema of lung ([place]) 03/11/2014   Osteoporosis 02/11/2014   Routine health maintenance 12/07/2011   DJD (degenerative joint disease) of knee    Hyperlipidemia with target LDL less than 100     Immunization History  Administered Date(s) Administered  Fluad Quad(high Dose 65+) 01/17/2019, 04/30/2020   Influenza Split 01/16/2011   Influenza, High Dose Seasonal PF 01/13/2017, 02/09/2018   Influenza,inj,Quad PF,6+ Mos 02/01/2013, 02/27/2014, 04/07/2015, 01/08/2016   PFIZER(Purple Top)SARS-COV-2 Vaccination 06/06/2019, 06/27/2019   Pneumococcal Conjugate-13 02/01/2013   Pneumococcal  Polysaccharide-23 10/29/2014   Td 12/06/2011    Conditions to be addressed/monitored:  Hypertension, Hyperlipidemia, Diabetes, Coronary Artery Disease and COPD  Care Plan : [place]  Updates made by Charlton Haws, [place] since 11/07/2020 12:00 AM     Problem: Hypertension, Hyperlipidemia, Diabetes, Coronary Artery Disease and COPD   Priority: High     Long-Range Goal: Disease management   Start Date: 08/07/2020  Expected End Date: 02/07/2021  This Visit's Progress: On track  Recent Progress: On track  Priority: High  Note:   Current Barriers:  Unable to independently monitor therapeutic efficacy  Pharmacist Clinical Goal(s):  Patient will achieve adherence to monitoring guidelines and medication adherence to achieve therapeutic efficacy  Interventions: 1:1 collaboration with Janith Lima, MD regarding development and update of comprehensive plan of care as evidenced by provider attestation and co-signature Inter-disciplinary care team collaboration (see longitudinal plan of care) Comprehensive medication review performed; medication list updated in electronic medical record  Hypertension   BP goal is:  <130/80 Patient checks BP at home 3-5x per week Patient home BP readings are ranging: 120/80s   Patient has failed these meds in the past: n/a Patient is currently controlled on the following medications:  Amlodipine 10 mg daily AM Carvedilol 6.25 mg BID  Spironolactone 25 mg daily AM   We discussed: pt reports compliance with medications; BP has been at goal for the most part  Plan: Continue current medications and control with diet and exercise    Hyperlipidemia    LDL goal < 70 Aortic atherosclerosis Coronary calcium score 625 (94th percentile) on coronary CT 02/2018  Patient has failed these meds in past: atorvastatin Patient is currently controlled on the following medications:  Rosuvastatin 40 mg daily Aspirin 81 mg daily   We  discussed:  pt reports compliance with new statin; she was worried about high blood sugars on atorvastatin but reports no issues with rosuvastatin   Plan: Recommend to continue current medication   Diabetes    A1c goal <7% Checking BG: Daily Recent FBG Readings: 100-123   Patient has failed these meds in past: n/a Patient is currently controlled on the following medications: Janumet XR 50-1000 mg - 2 tablets daily Carb [employer] ([employer]) Testing supplies    We discussed: pt reports compliance with medication; she tries to avoid foods that will raise her BG; A1c has improved to 6.4%   Plan: Continue current medications and control with diet and exercise   COPD    Last spirometry score: 04/15/2016 -FEV1 34% predicted -FEV1/FVC 0.47 Gold Grade: Gold 3 (FEV1 30-49%) Current COPD Classification:  B (high sx, <2 exacerbations/yr)    Patient has failed these meds in past: n/a Patient is currently controlled on the following medications:  Trelegy 1 puff daily Proair Respiclick 1-2 puff PRN Roflumilast 500 mg daily Oxygen 2 l/m - portable and stand-alone Albuterol nebulization   Using maintenance inhaler regularly? Yes Frequency of rescue inhaler use:  3-5x times per week   We discussed:  proper inhaler technique; she uses rescue inhaler more often in cold weather; pt is interested in being in a clinical trial for new medication - Dr Chase Caller is working with her on this   Plan:  Continue current medications and control with diet and exercise    Patient Goals/Self-Care Activities Patient will:  - take medications as prescribed -focus on medication adherence by pill box -check glucose daily, document, and provide at future appointments -check blood pressure daily, document, and provide at future appointments      Medication Assistance: None required.  Patient affirms current coverage meets needs.  Compliance/Adherence/Medication fill history: Care  Gaps: Shingrix Covid booster (due 07/25/19) Eye exam (due 03/17/20) Urine microalbumin (due 04/18/20)  Star-Rating Drugs: Rosuvastatin - LF 11/03/20 x 30 ds Janumet - LF 11/03/20  [name] preferred pharmacy is:  [facility] - [place], [place] - [name] [place] [address] Phone: [phone] Fax: [phone]  [place] ([place]), [place] - [place] [address]  [place]) [place] [address] Phone: [phone] Fax: [phone]  Uses pill box? Yes Pt endorses 100% compliance  We discussed: Reviewed patient's [facility] medication and Epic medication profile assuring there are no discrepancies or gaps in therapy. Confirmed all fill dates appropriate and verified with patient that there is a sufficient quantity of all prescribed medications at home. Informed patient to call me any time if needing medications before scheduled deliveries.   Patient decided to: Utilize [facility] for medication synchronization, packaging and delivery  Care Plan and Follow Up Patient Decision:  Patient agrees to Care Plan and Follow-up.  Plan: Telephone follow up appointment with care management team member scheduled for:  6 months  Charlene Brooke, PharmD, [place], CPP Clinical Pharmacist [facility] at [facility] [phone]

## 2020-11-10 NOTE — Progress Notes (Signed)
Called [facility] to verify patient recent vaccines, the patient received her Covid Booster on 05/21/20 and her two Shingrix were done on 11/21/19 & 05/12/20.  [name], [place] Clinical Pharmacists Assistant [phone]  Time Spent: 34

## 2020-11-11 DIAGNOSIS — J449 Chronic obstructive pulmonary disease, unspecified: Secondary | ICD-10-CM | POA: Diagnosis not present

## 2020-11-13 ENCOUNTER — Other Ambulatory Visit: Payer: Self-pay

## 2020-11-13 ENCOUNTER — Ambulatory Visit (INDEPENDENT_AMBULATORY_CARE_PROVIDER_SITE_OTHER): Payer: Medicare Other

## 2020-11-13 ENCOUNTER — Other Ambulatory Visit: Payer: Self-pay | Admitting: Oncology

## 2020-11-13 DIAGNOSIS — Z9889 Other specified postprocedural states: Secondary | ICD-10-CM

## 2020-11-13 DIAGNOSIS — M810 Age-related osteoporosis without current pathological fracture: Secondary | ICD-10-CM | POA: Diagnosis not present

## 2020-11-13 MED ORDER — DENOSUMAB 60 MG/ML ~~LOC~~ SOSY
60.0000 mg | PREFILLED_SYRINGE | Freq: Once | SUBCUTANEOUS | Status: AC
  Administered 2020-11-13: 60 mg via SUBCUTANEOUS

## 2020-11-13 NOTE — Progress Notes (Signed)
Pt here for Prolia injection per Dr Ronnald Ramp.  Prolia 60mg  given subcutaneously in left arm and pt tolerated injection well.  Next Prolia injection scheduled for 05/15/21.

## 2020-11-18 ENCOUNTER — Ambulatory Visit: Payer: Medicare Other | Admitting: Internal Medicine

## 2020-11-18 ENCOUNTER — Other Ambulatory Visit: Payer: Self-pay

## 2020-11-18 ENCOUNTER — Encounter: Payer: Self-pay | Admitting: Internal Medicine

## 2020-11-18 VITALS — BP 130/58 | HR 94 | Temp 98.3°F | Ht 64.5 in | Wt 209.0 lb

## 2020-11-18 DIAGNOSIS — J449 Chronic obstructive pulmonary disease, unspecified: Secondary | ICD-10-CM

## 2020-11-18 DIAGNOSIS — G4733 Obstructive sleep apnea (adult) (pediatric): Secondary | ICD-10-CM | POA: Diagnosis not present

## 2020-11-18 DIAGNOSIS — Z129 Encounter for screening for malignant neoplasm, site unspecified: Secondary | ICD-10-CM

## 2020-11-18 DIAGNOSIS — J9611 Chronic respiratory failure with hypoxia: Secondary | ICD-10-CM | POA: Diagnosis not present

## 2020-11-18 MED ORDER — DALIRESP 500 MCG PO TABS: 500.0000 ug | ORAL_TABLET | Freq: Every day | ORAL | 3 refills | Status: DC

## 2020-11-18 NOTE — Patient Instructions (Addendum)
COPD, severe (HCC) Chronic respiratory failure with hypoxia (HCC)  -Stable disease  Plan -Continue oxygen, Trelegy and Daliresp as before. - get backpack for o2  -only lighter o2 system thatn what you have is [employer] G4 - 3.3#  our staff can try to get it for you but you might have to do it out of pocket by contacting [employer] - support motorized scooter/wheel chair  due to copd, obesity, OA   Cancer screening  -No evidence of lung cancer in August 2020 low-dose CT scan of the chest and feb 2022  Plan -Repeat CT scan of the chest March 2023   Follow-up -2-3  months with Dr Benjimen Kelley for routine followup 

## 2020-11-18 NOTE — Progress Notes (Addendum)
OV 07/20/2017  Chief Complaint  Patient presents with   Follow-up    Follow up visit. Pt also needing surgical clearance for lumpectomy.  Pt saw RB beginning February annd was givien prednisone. Pt states breathing has been tolerable with not taking prednisone. DME: [employer], 2L continuous at home 3L pulse when out     Follow-up severe COPD patient with obesity and sleep apnea.  She says she is compliant with all her COPD inhalers.  She is also compliant with her CPAP.  She is not smoking anymore.  Overall COPD CAT score is 16.  She uses oxygen all the time and CPAP at night.  She is due for breast surgery lumpectomy and is here for a preoperative evaluation.  She tells me that she is in good nutritional status.  She is not anemic.  She has got good kidney function.  She says the surgery will be elective and less than 2 hours and does not involve operating on the neck or the chest or the abdomen.  It will be done at [facility] and under experience surgeon.  Overall she is very functional and active despite his severe COPD.   OV 09/14/2017  Chief Complaint  Patient presents with   Follow-up    Pt states she has been doing better since her visit with Dr. Halford Chessman last week. Pt is currently taking an abx and just finished last dose of prednisone today. Pt's cough has become better and is still wheezing some. SOB is about the same.    Follow-up advanced COPD with obesity and sleep apnea and chronic hypoxemic respiratory failure  I last saw her in March 2019 which itself followed in exacerbation.  Since then she has had 2 visits in my office with 2 providers for COPD exacerbation.  Most recently September 09, 2017.  Chest x-ray reviewed but did not visualize and the x-ray is clear.  [Of note she had low-dose CT scan of the chest May 2018 and is due for one-year CT scan coming up anytime now] her baseline COPD CAT scan.  She is using oxygen between 2-3 L.  She feels her exacerbation is slowly  resolving but she is still symptomatic COPD CAT score is 24 and much worse than baseline although this is improved compared to last week.  She did have breast cancer surgery recently and this went well.  Currently breast cancer in remission.  Review of her medication shows that while she was in the hospital nebulized bronchodilator long-acting beta agonist was tried and she really liked it.  Therefore on September 09, 2017 visit her combination inhaler Stiolto was changed to [place] she is here to start this.and Yupelri.  Review of her chart shows that she is not on inhaled corticosteroid.  Sometime back she was on Arnuity but she is not taking it due to lack of perceived benefit.  But now she is having recurrent exacerbations.  Therefore she is open to starting it again     OV 02/09/2018  Subjective:  Patient ID: Victoria [name], female , DOB: 1946/03/09 , age 49 y.o. , MRN: [id number] , ADDRESS: [address] [place] [id number]   02/09/2018 -   Chief Complaint  Patient presents with   Follow-up    Pt states she has been okay since last visit. States she is still having problems becoming SOB when she exerts herself and has occ cough when she has the SOB. Denies any complaints of wheezing  or CP. Pt does develop chest tightness when she has problems with SOB.     HPI Victoria [name] 75 y.o. -with advanced COPD and chronic hypoxemic respiratory failure.  Presents for routine follow-up.  Few weeks ago she had an exacerbation recall an antibiotic and prednisone.  After that she is improved back to her baseline with COPD CAT score shown below 15 and the symptomatology described.  She has new onset of pedal edema for the last 3 days.  This is associated with increased dietary intake of soda and also salted chips.  There is no fever or rash or wheezing.  Her COPD medications include nebulizers.  She was changed to the sometime back by nurse practitioner.  She is asking about going back to inhaler  particularly Stiolto.  She is willing to try Trelegy         OV 07/20/2018  Subjective:  Patient ID: Victoria [name], female , DOB: 02/12/1946 , age 18 y.o. , MRN: [id number] , ADDRESS: [address] [place] [id number]   07/20/2018 -   Chief Complaint  Patient presents with   Follow-up    Pt states her breathing has become worse since last visit, has an occ dry cough, and also has some tightness in chest.   Follow-up) COPD with chronic hypoxemic respiratory failure.  On Trelegy, oxygen 2 L and Daliresp.  HPI Victoria [name] 75 y.o. -presents for routine follow-up.  Since last seeing me he saw a nurse practitioner for follow-up.  She tells me for the last 3 days she is has increased shortness of breath and increased chest tightness and wheezing but no increase in cough or sputum production.  This fits in with a slight increase in COPD CAT score to 18.  She takes 2 L of oxygen at home and 3 L with a portable system.  She continues on Daliresp without problems and also Trelegy inhaler.  Viral exposure or travel history -   Is negative.  Last CT scan of the chest was in June 2019 showed only emphysema.  This was for lung cancer screening.  Last pulmonary function test was in 2017.  Most recent blood work reviewed was in October 2019 and normal.   Also c/p bad knees and morning stiffness and arthralgia/arthritis for long tme. No dx opf RA     OV 01/17/2019  Subjective:  Patient ID: Victoria [name], female , DOB: 06/24/1945 , age 6 y.o. , MRN: [id number] , ADDRESS: [place] [id number]  Follow-up) COPD with chronic hypoxemic respiratory failure.  On Trelegy, oxygen 2 L and Daliresp.  01/17/2019 -  Copd followup   HPI Victoria [name] 75 y.o. -returns for follow-up of advanced COPD.  Overall she is stable.  She is doing good with social distancing.  She has not had any COPD exacerbations.  She is having some dryness of the nose with the oxygen for the last few  days and this is causing burning and irritation.  She tried some saline nasal spray yesterday and that seems to help.  She had a low-dose CT scan for lung cancer screening and her lung nodules are stable.  Repeat CT scan is recommended in 1 year.  She is compliant with her oxygen, Trelegy and Daliresp.  This combination is helping her.  COPD CAT score is 16.  She is having some economic difficulties because of the pandemic but otherwise is doing okay.    IMPRESSION: 1. Lung-RADS 2, benign  appearance or behavior. Continue annual screening with low-dose chest CT without contrast in 12 months. 2. Aortic atherosclerosis (ICD10-I70.0), coronary artery atherosclerosis and emphysema (ICD10-J43.9). 3. Similar left breast nodule of 8 mm. The patient had a negative left-sided diagnostic mammogram on 12/04/2018. 4. Similar left adrenal adenomas.     Electronically Signed   By: Abigail Miyamoto M.D.   On: 01/10/2019 14:34     OV 07/25/2019  Subjective:  Patient ID: Victoria [name], female , DOB: 06-Dec-1945 , age 52 y.o. , MRN: [id number] , ADDRESS: [address] [place] [id number]   07/25/2019 -   Chief Complaint  Patient presents with   Follow-up    Pt states she has been doing okay since last visit. Pt states she still becomes SOB with activities but states breathing is stable.   Follow-up) COPD with chronic hypoxemic respiratory failure.  On Trelegy, oxygen 2 L and Daliresp.  HPI Victoria [name] 75 y.o. -presents for follow-up of her COPD and chronic hypoxic respiratory failure.  She tells me that recently she has been forgetting to use her oxygen while at rest at home a few hours later when she checks her pulse ox is 93%.  She has been surprised by how good this is.  Overall COPD stable.  When she exerts herself she feels she needs 3 L because she gets short of breath although she is not checked her pulse ox with exertion.  She is on Trelegy and Daliresp and oxygen 24/7.  Cancer  screening: Last CT scan of the chest August 2020 and this was without any evidence of lung cancer.  Follow-up in 1 year is recommended.  Out of work: I did a letter for her on the assumption she wanted to go back to work.  However she tells me that she is very afraid to go back to work.  This is because she feels she will get COVID-19 at work.  So she wants me to redo this letter.  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  At this point in time patient is vaccinated but her client is not vaccinated fully.  Declined second dose is pending at the end of this month 2021.  There are other people at work or not vaccinated.  She has managed to avoid getting Covid by staying within her safety bubble, social distancing and wearing a mask.  CDC guidelines yesterday indicate that vaccinated people can gather indoors with other vaccinated people without a mask.  In my interpretation of the CDC guidelines it appears this is more relevant to family situation as opposed to work.  She has advanced COPD and she could be at an extremely high risk of death if she were to get COVID-19.  Therefore I would support her request to stay off work for a few more months till further guidelines from [facility] regarding work-up delineated and also more people in the community are vaccinated.  Noted: She has not been to work in a year.   CAT Score 07/25/2019 09/14/2017  Total CAT Score 12 24     ROS - per HPI  04/30/2020 Follow up : COPD , O2 RF , OSA   75 year old female former smoker followed for severe gold 3 COPD and oxygen pendant respiratory failure, obstructive sleep apnea nocturnal CPAP Breast cancer history diagnosed in 2019 status post lumpectomy.  She did not receive chemo or radiation therapy. On Arimidex.  TEST/EVENTS :  Pulmonary function test June 2017: stage III COPD FEV1 0.88 L/47% in 10/21/2015 and 0.69 L/37% later in June  2017 CT chest lung cancer screening 10/09/2015: Emphysema with very small lung nodules Echo January 2016: Shows grade 1 diastolic dysfunction Nuclear medicine cardiac stress to 12/11/2015: Normal tests Lexiscan Myoview with ejection fraction 75% PSG 12/2014 >AHI 35/h  12/2018 Lung-RADS 2, benign appearance or behavior. Continue annual screening with low-dose chest CT without contrast in 12 months.   Patient presents for a 58-monthfollow-up.  Patient has underlying severe COPD.  She remains on Trelegy daily and Daliresp daily .  Says overall breathing is doing okay. Gets winded with activity . No flare of cough or wheezing  Needs new neb machine and refills of albuterol neb. Machine is too old.   Patient participates in the low-dose CT screening program.  CT chest on January 11, 2020 showed a lung RADS 3.  She had multiple small nodules measuring up to 5.9 mm.  She has been planned for a follow-up CT chest in 6 months.  She has chronic respiratory failure is on oxygen 3 L on her portable system at 2 L at home.  Says she is had no increased oxygen demands.  She gets winded with minimum activity.  No change in her activity tolerance  She remains busy.  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   She does have sleep apnea but is CPAP intolerant.  Covid vaccines are up-to-date. Needs booster and flu shot .   OV 11/18/2020  Subjective:  Patient ID: Victoria [name] female , DOB: [birth year], age 75y.o. , MRN: [id number], [address] [id number] JJanith Lima MD Patient Care Team: JJanith Lima MD as PCP - General (Internal Medicine) KTroy Sine MD as PCP - Cardiology (Cardiology) RBrand Males MD as Consulting Physician (Pulmonary Disease) MArvella Nigh MD as Consulting Physician (Obstetrics and Gynecology) Magrinat, GVirgie Dad MD as Consulting Physician  (Oncology) WRolm Bookbinder MD as Consulting Physician (General Surgery) KGery Pray MD as Consulting Physician (Radiation Oncology) KTroy Sine MD as Consulting Physician (Cardiology) CDelice Bison LCharlestine Massed NP as Nurse Practitioner (Hematology and Oncology) Parrett, TFonnie Mu NP as Nurse Practitioner (Pulmonary Disease) ERenato Shin MD as Consulting Physician (Endocrinology) FCharlton Haws RVictor [facility] Centeras Pharmacist (Pharmacist)  This Provider for this visit: Treatment Team:  Attending Provider: RBrand Males MD    11/18/2020 -   Chief Complaint  Patient presents with   Follow-up    Pt states her breathing has become some worse since last visit. States the heat worsens her breathing.     HPI Victoria BPaislyn Domenico89y.o. -  Follow-up advanced COPD: Presents for follow-up.  She is on Trelegy and Daliresp and oxygen.  She uses oxygen 2 L at rest.  With the portable system she goes up to 3 L.  Overall she is stable without any exacerbations.  She ran out of diuretics approximately a week ago my CMA is refill this.  She is complaining that her portable oxygen system was too heavy.  She told me it is 20 pounds.  I carried it and is much lighter.  Later she admitted this was 5.6 pounds.  She wants something even lighter while doing groceries.  She is interested in [facility].  We looked up G for model and it is 3.3 pounds with  battery and she wants this.  I advised her that the insurance might not cover this.  I also advised her that if insurance covers this there she will have to return the current system which is somewhat reluctant.  I advised her that our [facility] can try to get this approved but in the absence of which she will have to try to pay for it for herself out-of-pocket which apparently cost $3000.  She will contact the company to see if there is a payment plan on this.  I advised that she could use a backpack and I gave her a reference for that.  Lung cancer  screening: No evidence of lung cancer in February 2022 CT chest.  1 year follow-up recommended.   Obesity: Following a low carbohydrate diet sheet that I recommended once before and is losing weight.  Sleep apnea: Uses CPAP at night   Mobiity - due to copd/OA/Obbesity - needs motorized wheel chair  CAT Score 07/25/2019 09/14/2017  Total CAT Score 12 24       PFT  PFT Results Latest Ref Rng & Units 04/15/2016 11/05/2015 10/21/2015  FVC-Pre L 1.41 1.50 1.66  FVC-Predicted Pre % 58 63 69  FVC-Post L 1.38 1.50 1.83  FVC-Predicted Post % 57 63 77  Pre FEV1/FVC % % 45 41 44  Post FEV1/FCV % % 47 46 48  FEV1-Pre L 0.64 0.62 0.73  FEV1-Predicted Pre % 34 33 39  FEV1-Post L 0.65 0.69 0.88  DLCO uncorrected ml/min/mmHg 9.45 - -  DLCO UNC% % 36 - -  DLCO corrected ml/min/mmHg 9.47 - -  DLCO COR %Predicted % 37 - -  DLVA Predicted % 60 - -       has a past medical history of Abdominal aneurysm ([place]), Anemia, Arthritis, Breast cancer in female [facility]), COPD (chronic obstructive pulmonary disease) ([place]), Dyspnea, Headache, Heart murmur, Hyperlipidemia, Hypertension, Multiple thyroid nodules, Murmur, cardiac (1950), Osteoporosis, Pneumonia, Pre-diabetes, Requires continuous at home supplemental oxygen, Sebaceous cyst, Sleep apnea, and Varicella (as child).   reports that she quit smoking about 9 years ago. Her smoking use included cigarettes. She has a 50.00 pack-year smoking history. She has never used smokeless tobacco.  Past Surgical History:  Procedure Laterality Date   APPENDECTOMY     2008   BREAST LUMPECTOMY Left 08/31/2017   BREAST LUMPECTOMY WITH RADIOACTIVE SEED LOCALIZATION Left 08/10/2017   Procedure: BREAST LUMPECTOMY WITH RADIOACTIVE SEED LOCALIZATION;  Surgeon: Rolm Bookbinder, MD;  Location: [facility];  Service: General;  Laterality: Left;   [place]   COLONOSCOPY     COLONOSCOPY WITH PROPOFOL N/A 04/16/2016   Procedure: COLONOSCOPY WITH PROPOFOL;  Surgeon:  Carol Ada, MD;  Location: [facility];  Service: Endoscopy;  Laterality: N/A;   CYST REMOVAL NECK Left 04/13/2016   Procedure: EXCISION OF SEBACEOUS CYST LEFT POSTERIOR NECK;  Surgeon: Armandina Gemma, MD;  Location: [facility];  Service: General;  Laterality: Left;   Excision of Pelvic Absess, Right Ovary     2008   RE-EXCISION OF BREAST CANCER,SUPERIOR MARGINS Left 08/31/2017   Procedure: RE-EXCISION OF LEFT  BREAST MARGINS ERAS PATHWAY;  Surgeon: Rolm Bookbinder, MD;  Location: [facility];  Service: General;  Laterality: Left;   TUBAL LIGATION  1980    Allergies  Allergen Reactions   Benicar [Olmesartan] Swelling    Swelling of face and arms    [name] [Valsartan] Swelling    Swelling of face and arms    Hydrocodone-Acetaminophen Nausea  And Vomiting    Severe vomiting   Lisinopril Cough   Monosodium Glutamate Other (See Comments)    Facial swelling per pt   Codeine Other (See Comments)    jittery   Lead Acetate Rash   Nickel Rash    Severe rash to infection: pt is allergic to all metals other than sterling silver or gold jewelry.     Immunization History  Administered Date(s) Administered   Fluad Quad(high Dose 65+) 01/17/2019, 04/30/2020   Influenza Split 01/16/2011   Influenza, High Dose Seasonal PF 01/13/2017, 02/09/2018   Influenza,inj,Quad PF,6+ Mos 02/01/2013, 02/27/2014, 04/07/2015, 01/08/2016   PFIZER(Purple Top)SARS-COV-2 Vaccination 06/06/2019, 06/27/2019, 05/21/2020   Pneumococcal Conjugate-13 02/01/2013   Pneumococcal Polysaccharide-23 10/29/2014   Td 12/06/2011   Zoster Recombinat (Shingrix) 11/21/2019, 05/12/2020    Family History  Problem Relation Age of Onset   Heart disease Mother        MI - fatal   Hypertension Mother    Stroke Father 20       fatal   Alzheimer's disease Father    Alzheimer's disease Brother    Hyperlipidemia Brother    Hypertension Brother    Diabetes Brother    Hypertension Brother    Hyperlipidemia Brother    Breast cancer  Paternal Grandmother      Current Outpatient Medications:    Accu-Chek Softclix Lancets lancets, USE TO check BLOOD GLUCOSE ONCE DAILY, Disp: 100 each, Rfl: 0   acetaminophen (TYLENOL) 500 MG tablet, Take 1,000 mg by mouth every 6 (six) hours as needed., Disp: , Rfl:    albuterol (PROVENTIL) (2.5 MG/3ML) 0.083% nebulizer solution, Take 3 mLs (2.5 mg total) by nebulization every 6 (six) hours as needed for wheezing or shortness of breath., Disp: 75 mL, Rfl: 3   Albuterol Sulfate (PROAIR RESPICLICK) 108 (90 Base) MCG/ACT AEPB, Inhale 1-2 puffs into the lungs every 6 (six) hours as needed (for wheezing/shortness of breath)., Disp: 1 each, Rfl: 5   amLODipine (NORVASC) 10 MG tablet, Take 1 tablet (10 mg total) by mouth daily., Disp: 90 tablet, Rfl: 1   anastrozole (ARIMIDEX) 1 MG tablet, Take 1 tablet by mouth once daily, Disp: 90 tablet, Rfl: 0   aspirin EC 81 MG tablet, Take 1 tablet (81 mg total) by mouth daily., Disp: 90 tablet, Rfl: 3   azithromycin (ZITHROMAX) 250 MG tablet, Take 2 tablets on first day, then take 1 tablet daily until finished., Disp: 6 tablet, Rfl: 0   Blood Glucose Monitoring Suppl (ACCU-CHEK AVIVA PLUS) w/Device KIT, 1 Device by Does not apply route daily. Use to monitor glucose levels once per day; E11.9, Disp: 1 kit, Rfl: 0   carvedilol (COREG) 6.25 MG tablet, Take 1 tablet (6.25 mg total) by mouth 2 (two) times daily., Disp: 180 tablet, Rfl: 3   Cholecalciferol (VITAMIN D) 125 MCG (5000 UT) CAPS, Take 1 capsule by mouth daily., Disp: , Rfl:    denosumab (PROLIA) 60 MG/ML SOLN injection, Inject 60 mg into the skin every 6 (six) months. Administer in upper arm, thigh, or abdomen, Disp: , Rfl:    Fluticasone-Umeclidin-Vilant (TRELEGY ELLIPTA) 100-62.5-25 MCG/INH AEPB, INHALE 1 DOSE INTO THE LUNGS DAILY., Disp: 60 each, Rfl: 5   glucose blood (ACCU-CHEK GUIDE) test strip, USE 1 STRIP ONCE DAILY, Disp: 100 each, Rfl: 0   glucose blood test strip, USE 1 STRIP ONCE DAILY, Disp:  100 each, Rfl: 12   OXYGEN, Inhale 2-3 L into the lungs continuous. When exerting self, Disp: , Rfl:  potassium chloride SA (KLOR-CON) 20 MEQ tablet, Take 1 tablet (20 mEq total) by mouth 2 (two) times daily., Disp: 180 tablet, Rfl: 0   rosuvastatin (CRESTOR) 40 MG tablet, Take 1 tablet (40 mg total) by mouth daily., Disp: 90 tablet, Rfl: 1   SitaGLIPtin-MetFORMIN HCl (JANUMET XR) 50-1000 MG TB24, Take 2 tablets by mouth daily., Disp: 180 tablet, Rfl: 3   spironolactone (ALDACTONE) 25 MG tablet, Take 1 tablet (25 mg total) by mouth daily., Disp: 90 tablet, Rfl: 0   thiamine (VITAMIN B-1) 100 MG tablet, Take 1 tablet (100 mg total) by mouth every other day., Disp: 45 tablet, Rfl: 1   roflumilast (DALIRESP) 500 MCG TABS tablet, Take 1 tablet (500 mcg total) by mouth daily., Disp: 90 tablet, Rfl: 3      Objective:   Vitals:   11/18/20 1132  BP: (!) 130/58  Pulse: 94  Temp: 98.3 F (36.8 C)  TempSrc: Oral  SpO2: 96%  Weight: 209 lb (94.8 kg)  Height: 5' 4.5" (1.638 m)    Estimated body mass index is 35.32 kg/m as calculated from the following:   Height as of this encounter: 5' 4.5" (1.638 m).   Weight as of this encounter: 209 lb (94.8 kg).  $Rem'@WEIGHTCHANGE'oOZi$ @  [employer]   11/18/20 1132  Weight: 209 lb (94.8 kg)     Physical Exam General: No distress. Looks well Neuro: Alert and Oriented x 3. GCS 15. Speech normal Psych: Pleasant Resp:  Barrel Chest - no.  Wheeze - no, Crackles - no, No overt respiratory distress CVS: Normal heart sounds. Murmurs - no Ext: Stigmata of Connective Tissue Disease - no HEENT: Normal upper airway. PEERL +. No post nasal drip        Assessment:       ICD-10-CM   1. COPD, severe ([place])  J44.9     2. Chronic respiratory failure with hypoxia (H[facility])  J96.11     3. Cancer screening  Z12.9     4. OSA (obstructive sleep apnea)  G47.33          Plan:     Patient Instructions  COPD, severe ([place]) Chronic respiratory failure with  hypoxia (H[facility])  -Stable disease  Plan -Continue oxygen, Trelegy and Daliresp as before. - get backpack for o2  -only lighter o2 system thatn what you have is [employer] G4 - 3.3#  our staff can try to get it for you but you might have to do it out of pocket by contacting [employer] - support motorized scooter/wheel chair  due to copd, obesity, OA   Cancer screening  -No evidence of lung cancer in August 2020 low-dose CT scan of the chest and feb 2022  Plan -Repeat CT scan of the chest March 2023   Follow-up -2-3  months with Dr Chase Caller for routine followup    SIGNATURE    Dr. Brand Males, M.D., F.C.C.P,  Pulmonary and Critical Care Medicine Staff Physician, [place] Director - Interstitial Lung Disease  Program  Pulmonary [place] [facility] [place], [place], [address]  Pager: [phone], If no answer or between  15:00h - 7:00h: call [phone] Telephone: [phone]  12:04 PM 11/18/2020

## 2020-11-25 ENCOUNTER — Telehealth: Payer: Self-pay | Admitting: Internal Medicine

## 2020-11-25 NOTE — Telephone Encounter (Signed)
Spoke with patient. She stated that she will travelling to [facility] 09/13-09/17. She has been told that the air is "much thinner" in [place] compared to [place]. She will be bring her POC with her and she will make arrangements with [employer] to have a concentrator available for her to use there.   She wanted to know if MR or TP had any additional recommendations for her. I advised her that MR would be out of the office until almost August. Also advised that TP would be out of the office until next Wednesday. She is ok with waiting for a response from TP.   TP, can you please advise? Thanks!

## 2020-11-27 NOTE — Telephone Encounter (Signed)
As long as she has her oxygen she should be fine, would take pulse oximeter to make sure she does not need to increase to keep sats >88-90%.

## 2020-11-27 NOTE — Telephone Encounter (Signed)
LMTCB

## 2020-12-04 NOTE — Telephone Encounter (Signed)
Called and spoke to pt. Informed her of the recs per TP. Pt verbalized understanding and denied any further questions or concerns at this time.

## 2020-12-11 DIAGNOSIS — J449 Chronic obstructive pulmonary disease, unspecified: Secondary | ICD-10-CM | POA: Diagnosis not present

## 2020-12-13 ENCOUNTER — Ambulatory Visit
Admission: RE | Admit: 2020-12-13 | Discharge: 2020-12-13 | Disposition: A | Discharge status: Home / Self Care | Payer: Medicare Other | Source: Ambulatory Visit | Attending: Oncology | Admitting: Oncology

## 2020-12-13 DIAGNOSIS — Z9889 Other specified postprocedural states: Secondary | ICD-10-CM

## 2020-12-13 DIAGNOSIS — G4733 Obstructive sleep apnea (adult) (pediatric): Secondary | ICD-10-CM | POA: Diagnosis not present

## 2020-12-13 DIAGNOSIS — R922 Inconclusive mammogram: Secondary | ICD-10-CM | POA: Diagnosis not present

## 2020-12-13 DIAGNOSIS — Z853 Personal history of malignant neoplasm of breast: Secondary | ICD-10-CM | POA: Diagnosis not present

## 2020-12-21 ENCOUNTER — Other Ambulatory Visit: Payer: Self-pay | Admitting: Internal Medicine

## 2021-01-08 ENCOUNTER — Ambulatory Visit: Payer: Medicare Other | Admitting: Endocrinology

## 2021-01-09 ENCOUNTER — Ambulatory Visit: Payer: Medicare Other | Admitting: Endocrinology

## 2021-01-11 DIAGNOSIS — G4733 Obstructive sleep apnea (adult) (pediatric): Secondary | ICD-10-CM | POA: Diagnosis not present

## 2021-01-11 DIAGNOSIS — I10 Essential (primary) hypertension: Secondary | ICD-10-CM | POA: Diagnosis not present

## 2021-01-11 DIAGNOSIS — J449 Chronic obstructive pulmonary disease, unspecified: Secondary | ICD-10-CM | POA: Diagnosis not present

## 2021-01-20 ENCOUNTER — Telehealth: Payer: Self-pay | Admitting: Pharmacist

## 2021-01-20 NOTE — Progress Notes (Signed)
Chronic Care Management Pharmacy Assistant   Name: Victoria [name] MRN: [id number] DOB: December 19, 1945  Reason for Encounter: Medication Coordination Call   Medications: Outpatient Encounter Medications as of 01/20/2021  Medication Sig   Accu-Chek Softclix Lancets lancets USE TO check BLOOD GLUCOSE ONCE DAILY   acetaminophen (TYLENOL) 500 MG tablet Take 1,000 mg by mouth every 6 (six) hours as needed.   albuterol (PROVENTIL) (2.5 MG/3ML) 0.083% nebulizer solution Take 3 mLs (2.5 mg total) by nebulization every 6 (six) hours as needed for wheezing or shortness of breath.   Albuterol Sulfate (PROAIR RESPICLICK) 409 (90 Base) MCG/ACT AEPB Inhale 1-2 puffs into the lungs every 6 (six) hours as needed (for wheezing/shortness of breath).   amLODipine (NORVASC) 10 MG tablet Take 1 tablet (10 mg total) by mouth daily.   anastrozole (ARIMIDEX) 1 MG tablet Take 1 tablet by mouth once daily   aspirin EC 81 MG tablet Take 1 tablet (81 mg total) by mouth daily.   azithromycin (ZITHROMAX) 250 MG tablet Take 2 tablets on first day, then take 1 tablet daily until finished.   Blood Glucose Monitoring Suppl (ACCU-CHEK AVIVA PLUS) w/Device KIT 1 Device by Does not apply route daily. Use to monitor glucose levels once per day; E11.9   carvedilol (COREG) 6.25 MG tablet Take 1 tablet (6.25 mg total) by mouth 2 (two) times daily.   Cholecalciferol (VITAMIN D) 125 MCG (5000 UT) CAPS Take 1 capsule by mouth daily.   denosumab (PROLIA) 60 MG/ML SOLN injection Inject 60 mg into the skin every 6 (six) months. Administer in upper arm, thigh, or abdomen   Fluticasone-Umeclidin-Vilant (TRELEGY ELLIPTA) 100-62.5-25 MCG/INH AEPB inhale ONE DOSE into THE lungs ONCE DAILY   glucose blood (ACCU-CHEK GUIDE) test strip USE 1 STRIP ONCE DAILY   glucose blood test strip USE 1 STRIP ONCE DAILY   OXYGEN Inhale 2-3 L into the lungs continuous. When exerting self   potassium chloride SA (KLOR-CON) 20 MEQ tablet Take 1 tablet (20  mEq total) by mouth 2 (two) times daily.   roflumilast (DALIRESP) 500 MCG TABS tablet Take 1 tablet (500 mcg total) by mouth daily.   rosuvastatin (CRESTOR) 40 MG tablet Take 1 tablet (40 mg total) by mouth daily.   SitaGLIPtin-MetFORMIN HCl (JANUMET XR) 50-1000 MG TB24 Take 2 tablets by mouth daily.   spironolactone (ALDACTONE) 25 MG tablet Take 1 tablet (25 mg total) by mouth daily.   thiamine (VITAMIN B-1) 100 MG tablet Take 1 tablet (100 mg total) by mouth every other day.   No facility-administered encounter medications on file as of 01/20/2021.   Reviewed chart for medication changes ahead of medication coordination call.  No OVs, Consults, or hospital visits since last care coordination call/Pharmacist visit. (If appropriate, list visit date, provider name)  No medication changes indicated OR if recent visit, treatment plan here.  BP Readings from Last 3 Encounters:  11/18/20 (!) 130/58  10/07/20 (!) 159/60  08/21/20 (!) 190/90    Lab Results  Component Value Date   HGBA1C 6.4 (A) 08/21/2020     Patient obtains medications through Adherence Packaging  90 Days   Last adherence delivery included: Amlodipine 10 mg - breakfast Anastrozole 1 mg - breakfast Spironolactone 25 mg - breakfast B complex - breakfast Vitamin D3 - breakfast Rosuvastatin 40 mg daily Janumet 50-1055m 2 tabs at breakfast Daliresp 5069m daily  Patient is due for next adherence delivery on: 01/23/21. Called patient and reviewed medications and coordinated delivery.  This delivery  to include: Amlodipine 10 mg - 1 tab daily breakfast Spironolactone 25 mg - 1 tab daily breakfast B complex - 1 tab daily breakfast Vitamin D3 - 1 tab daily breakfast Janumet XR- 2 tabs daily bedtime Rosuvastatin 40 mg - 1 tab daily bedtime ACCU-CHEK GUIDE) test strip Accu-Chek Softclix Lancets  Patient needs refills for: Amlodipine Spironolactone Rosuvastatin  Confirmed delivery date of 01/23/21, advised patient that  pharmacy will contact them the morning of delivery.  Star Rating Drugs: Rosuvastatin 10/28/20 90D  Patient usually gets Vials but since she is traveling she wants to do packaging this delivery.  [name], [place] Clinical Pharmacists Assistant [phone]  Time Spent: [phone]

## 2021-01-21 ENCOUNTER — Other Ambulatory Visit: Payer: Self-pay | Admitting: Endocrinology

## 2021-01-21 ENCOUNTER — Other Ambulatory Visit: Payer: Self-pay | Admitting: Internal Medicine

## 2021-01-21 DIAGNOSIS — E876 Hypokalemia: Secondary | ICD-10-CM

## 2021-01-21 DIAGNOSIS — E118 Type 2 diabetes mellitus with unspecified complications: Secondary | ICD-10-CM

## 2021-01-21 DIAGNOSIS — I10 Essential (primary) hypertension: Secondary | ICD-10-CM

## 2021-01-21 DIAGNOSIS — T502X5A Adverse effect of carbonic-anhydrase inhibitors, benzothiadiazides and other diuretics, initial encounter: Secondary | ICD-10-CM

## 2021-01-25 ENCOUNTER — Emergency Department (HOSPITAL_COMMUNITY): Payer: Medicare Other

## 2021-01-25 ENCOUNTER — Other Ambulatory Visit: Payer: Self-pay

## 2021-01-25 ENCOUNTER — Encounter (HOSPITAL_COMMUNITY): Payer: Self-pay

## 2021-01-25 ENCOUNTER — Inpatient Hospital Stay (HOSPITAL_COMMUNITY): Payer: Medicare Other

## 2021-01-25 ENCOUNTER — Inpatient Hospital Stay (HOSPITAL_COMMUNITY)
Admission: EM | Admit: 2021-01-25 | Discharge: 2021-01-27 | DRG: 093 | Disposition: A | Discharge status: Home / Self Care | Payer: Medicare Other | Attending: Internal Medicine | Admitting: Internal Medicine

## 2021-01-25 DIAGNOSIS — E119 Type 2 diabetes mellitus without complications: Secondary | ICD-10-CM | POA: Diagnosis not present

## 2021-01-25 DIAGNOSIS — Z7951 Long term (current) use of inhaled steroids: Secondary | ICD-10-CM

## 2021-01-25 DIAGNOSIS — Z20822 Contact with and (suspected) exposure to covid-19: Secondary | ICD-10-CM | POA: Diagnosis present

## 2021-01-25 DIAGNOSIS — M810 Age-related osteoporosis without current pathological fracture: Secondary | ICD-10-CM | POA: Diagnosis present

## 2021-01-25 DIAGNOSIS — Z8249 Family history of ischemic heart disease and other diseases of the circulatory system: Secondary | ICD-10-CM | POA: Diagnosis not present

## 2021-01-25 DIAGNOSIS — Z9981 Dependence on supplemental oxygen: Secondary | ICD-10-CM | POA: Diagnosis not present

## 2021-01-25 DIAGNOSIS — R2990 Unspecified symptoms and signs involving the nervous system: Secondary | ICD-10-CM | POA: Diagnosis not present

## 2021-01-25 DIAGNOSIS — Z853 Personal history of malignant neoplasm of breast: Secondary | ICD-10-CM

## 2021-01-25 DIAGNOSIS — G319 Degenerative disease of nervous system, unspecified: Secondary | ICD-10-CM | POA: Diagnosis not present

## 2021-01-25 DIAGNOSIS — R479 Unspecified speech disturbances: Secondary | ICD-10-CM

## 2021-01-25 DIAGNOSIS — M62838 Other muscle spasm: Secondary | ICD-10-CM | POA: Diagnosis not present

## 2021-01-25 DIAGNOSIS — Z823 Family history of stroke: Secondary | ICD-10-CM | POA: Diagnosis not present

## 2021-01-25 DIAGNOSIS — Z170 Estrogen receptor positive status [ER+]: Secondary | ICD-10-CM

## 2021-01-25 DIAGNOSIS — R519 Headache, unspecified: Secondary | ICD-10-CM | POA: Diagnosis not present

## 2021-01-25 DIAGNOSIS — Z7982 Long term (current) use of aspirin: Secondary | ICD-10-CM

## 2021-01-25 DIAGNOSIS — Z79899 Other long term (current) drug therapy: Secondary | ICD-10-CM

## 2021-01-25 DIAGNOSIS — I10 Essential (primary) hypertension: Secondary | ICD-10-CM | POA: Diagnosis present

## 2021-01-25 DIAGNOSIS — R569 Unspecified convulsions: Secondary | ICD-10-CM

## 2021-01-25 DIAGNOSIS — E042 Nontoxic multinodular goiter: Secondary | ICD-10-CM | POA: Diagnosis not present

## 2021-01-25 DIAGNOSIS — R4789 Other speech disturbances: Secondary | ICD-10-CM | POA: Diagnosis not present

## 2021-01-25 DIAGNOSIS — Z79811 Long term (current) use of aromatase inhibitors: Secondary | ICD-10-CM | POA: Diagnosis not present

## 2021-01-25 DIAGNOSIS — R410 Disorientation, unspecified: Secondary | ICD-10-CM

## 2021-01-25 DIAGNOSIS — Z803 Family history of malignant neoplasm of breast: Secondary | ICD-10-CM | POA: Diagnosis not present

## 2021-01-25 DIAGNOSIS — Z7984 Long term (current) use of oral hypoglycemic drugs: Secondary | ICD-10-CM

## 2021-01-25 DIAGNOSIS — E785 Hyperlipidemia, unspecified: Secondary | ICD-10-CM | POA: Diagnosis present

## 2021-01-25 DIAGNOSIS — R4701 Aphasia: Principal | ICD-10-CM | POA: Diagnosis present

## 2021-01-25 DIAGNOSIS — Z833 Family history of diabetes mellitus: Secondary | ICD-10-CM | POA: Diagnosis not present

## 2021-01-25 DIAGNOSIS — R4782 Fluency disorder in conditions classified elsewhere: Secondary | ICD-10-CM | POA: Diagnosis not present

## 2021-01-25 DIAGNOSIS — G459 Transient cerebral ischemic attack, unspecified: Secondary | ICD-10-CM | POA: Diagnosis not present

## 2021-01-25 DIAGNOSIS — Z87891 Personal history of nicotine dependence: Secondary | ICD-10-CM

## 2021-01-25 DIAGNOSIS — I6523 Occlusion and stenosis of bilateral carotid arteries: Secondary | ICD-10-CM | POA: Diagnosis not present

## 2021-01-25 DIAGNOSIS — I6501 Occlusion and stenosis of right vertebral artery: Secondary | ICD-10-CM | POA: Diagnosis not present

## 2021-01-25 DIAGNOSIS — Z820 Family history of epilepsy and other diseases of the nervous system: Secondary | ICD-10-CM

## 2021-01-25 LAB — URINALYSIS, ROUTINE W REFLEX MICROSCOPIC
Bilirubin Urine: NEGATIVE
Glucose, UA: NEGATIVE mg/dL
Ketones, ur: NEGATIVE mg/dL
Leukocytes,Ua: NEGATIVE
Nitrite: NEGATIVE
Protein, ur: NEGATIVE mg/dL
Specific Gravity, Urine: 1.02 (ref 1.005–1.030)
pH: 6 (ref 5.0–8.0)

## 2021-01-25 LAB — CBC
HCT: 39.1 % (ref 36.0–46.0)
Hemoglobin: 12.4 g/dL (ref 12.0–15.0)
MCH: 27.4 pg (ref 26.0–34.0)
MCHC: 31.7 g/dL (ref 30.0–36.0)
MCV: 86.3 fL (ref 80.0–100.0)
Platelets: 225 10*3/uL (ref 150–400)
RBC: 4.53 MIL/uL (ref 3.87–5.11)
RDW: 14 % (ref 11.5–15.5)
WBC: 11.6 10*3/uL — ABNORMAL HIGH (ref 4.0–10.5)
nRBC: 0 % (ref 0.0–0.2)

## 2021-01-25 LAB — PROTIME-INR
INR: 1 (ref 0.8–1.2)
Prothrombin Time: 13.2 seconds (ref 11.4–15.2)

## 2021-01-25 LAB — COMPREHENSIVE METABOLIC PANEL
ALT: 8 U/L (ref 0–44)
AST: 16 U/L (ref 15–41)
Albumin: 3.8 g/dL (ref 3.5–5.0)
Alkaline Phosphatase: 72 U/L (ref 38–126)
Anion gap: 8 (ref 5–15)
BUN: 11 mg/dL (ref 8–23)
CO2: 26 mmol/L (ref 22–32)
Calcium: 9.4 mg/dL (ref 8.9–10.3)
Chloride: 106 mmol/L (ref 98–111)
Creatinine, Ser: 0.56 mg/dL (ref 0.44–1.00)
GFR, Estimated: >60 mL/min (ref >60)
Glucose, Bld: 183 mg/dL — ABNORMAL HIGH (ref 70–99)
Potassium: 4.3 mmol/L (ref 3.5–5.1)
Sodium: 140 mmol/L (ref 135–145)
Total Bilirubin: 0.5 mg/dL (ref 0.3–1.2)
Total Protein: 7.2 g/dL (ref 6.5–8.1)

## 2021-01-25 LAB — RAPID URINE DRUG SCREEN, HOSP PERFORMED
Amphetamines: NOT DETECTED
Barbiturates: NOT DETECTED
Benzodiazepines: NOT DETECTED
Cocaine: NOT DETECTED
Opiates: NOT DETECTED
Tetrahydrocannabinol: NOT DETECTED

## 2021-01-25 LAB — HEMOGLOBIN A1C
Hgb A1c MFr Bld: 6.3 % — ABNORMAL HIGH (ref 4.8–5.6)
Mean Plasma Glucose: 134.11 mg/dL

## 2021-01-25 LAB — APTT: aPTT: 29 seconds (ref 24–36)

## 2021-01-25 LAB — RESP PANEL BY RT-PCR (FLU A&B, COVID) ARPGX2
Influenza A by PCR: NEGATIVE
Influenza B by PCR: NEGATIVE
SARS Coronavirus 2 by RT PCR: NEGATIVE

## 2021-01-25 LAB — CBG MONITORING, ED
Glucose-Capillary: 192 mg/dL — ABNORMAL HIGH (ref 70–99)
Glucose-Capillary: 139 mg/dL — ABNORMAL HIGH (ref 70–99)

## 2021-01-25 LAB — ETHANOL: Alcohol, Ethyl (B): <10 mg/dL (ref <10)

## 2021-01-25 MED ORDER — ACETAMINOPHEN 500 MG PO TABS
1000.0000 mg | ORAL_TABLET | Freq: Once | ORAL | Status: AC
  Administered 2021-01-25: 1000 mg via ORAL
  Filled 2021-01-25: qty 2

## 2021-01-25 MED ORDER — PROCHLORPERAZINE EDISYLATE 10 MG/2ML IJ SOLN
10.0000 mg | Freq: Once | INTRAMUSCULAR | Status: AC
  Administered 2021-01-25: 10 mg via INTRAVENOUS
  Filled 2021-01-25: qty 2

## 2021-01-25 MED ORDER — LACTATED RINGERS IV BOLUS
500.0000 mL | Freq: Once | INTRAVENOUS | Status: AC
  Administered 2021-01-25: 500 mL via INTRAVENOUS

## 2021-01-25 MED ORDER — IOHEXOL 350 MG/ML SOLN
100.0000 mL | Freq: Once | INTRAVENOUS | Status: AC | PRN
  Administered 2021-01-25: 100 mL via INTRAVENOUS

## 2021-01-25 NOTE — ED Provider Notes (Signed)
  Physical Exam  BP (!) 185/78   Pulse 90   Temp 98.2 F (36.8 C) (Oral)   Resp 18   Ht 5' 4.5" (1.638 m)   Wt 94.8 kg   SpO2 99%   BMI 35.32 kg/m   Physical Exam  ED Course/Procedures     Procedures  MDM  Patient care assumed at 3:30 PM.  Patient is here with trouble speaking that is waxing and waning.  MRI brain showed no stroke.  Since patient's symptoms initially resolved and came back, code stroke was activated.  Dr. Curly Shores from telemetry neurology saw patient.  She obtained a CT perfusion study which showed possible perfusion defect but there is no LVO.  She thinks that this patient likely having seizure.  She wants patient hooked up to continuous EEG before getting any antiepileptics.  She wants patient to be transferred emergently to [facility] ED.  I discussed case with Dr. Gilford Raid who is excepting doctor.  Anticipate that patient will need admission after EEG.  If patient is actively seizing she will need ICU otherwise, she can go to the hospital service  CRITICAL CARE Performed by: Wandra Arthurs   Total critical care time:30 minutes  Critical care time was exclusive of separately billable procedures and treating other patients.  Critical care was necessary to treat or prevent imminent or life-threatening deterioration.  Critical care was time spent personally by me on the following activities: development of treatment plan with patient and/or surrogate as well as nursing, discussions with consultants, evaluation of patient's response to treatment, examination of patient, obtaining history from patient or surrogate, ordering and performing treatments and interventions, ordering and review of laboratory studies, ordering and review of radiographic studies, pulse oximetry and re-evaluation of patient's condition.        Drenda Freeze, MD 01/25/21 [phone]

## 2021-01-25 NOTE — ED Provider Notes (Addendum)
[facility] DEPT Provider Note   CSN: [id number] Arrival date & time: 01/25/21  1106     History Chief Complaint  Patient presents with   Headache   trouble speaking    Victoria [name] is a 75 y.o. female.   Headache Associated symptoms: no abdominal pain, no back pain, no cough, no ear pain, no eye pain, no fever, no seizures, no sore throat, no vomiting and no weakness    75 year old female with a history of abdominal aneurysm, breast cancer, COPD, HLD, HTN, OSA who presents to the emergency department due to concern for speech abnormality.  The history is provided by the patient and the patient's niece.  She was last known normal around 9:30 AM this morning.  She was noted to have some garbled and confused speech.  She was also having some word finding difficulty.  She was brought to the [facility] emergency department where on arrival her symptoms had resolved by the time of her assessment in triage.  On my initial evaluation, the patient did not have slurred speech, dysarthria or word finding difficulties.  She arrived ABC intact, GCS 15, neurologically intact.  Additionally, the patient endorses a frontal headache with no radiation.  She endorses no vision changes or visual field deficit.  No blurry vision.  No fever or chills.  No neck pain or stiffness.  No nausea or vomiting.    Past Medical History:  Diagnosis Date   Abdominal aneurysm (HCC)    Dr. Oneida Alar follows lLOV 2 ''17 per pt "around 2 cm"   Anemia    as a child   Arthritis    left ankle, right knee, right SI joint, wrists, lower back   Breast cancer in female [facility] [place])    Left   COPD (chronic obstructive pulmonary disease) ([place])    ephysema-Dr. Chase Caller   Dyspnea    Headache    as a child would have terrible headaches during season changes   Heart murmur    congenital, 2 D echo '10   Hyperlipidemia    Hypertension    Multiple thyroid nodules    Murmur, cardiac 1950    Osteoporosis    Pneumonia    Pre-diabetes    Requires continuous at home supplemental oxygen    2 L 24/7   Sebaceous cyst    hairline sebaceous cyst left posterior neck to be excised 04-13-16 by Dr. Harlow Asa in [facility]   Sleep apnea    cpap used sometimes, uses oxygen concentrator 2 l/m nasally bedtime   Varicella as child    Patient Active Problem List   Diagnosis Date Noted   Lung nodules 04/30/2020   Thiamine deficiency 04/24/2019   Aortic atherosclerosis ([place]) 11/23/2017   Malignant neoplasm of upper-outer quadrant of left breast in female, estrogen receptor positive ([place]) 07/01/2017   Chronic respiratory failure with hypoxia ([place]) 02/07/2017   DDD (degenerative disc disease), lumbar 01/13/2017   Estrogen deficiency 10/20/2016   Type II diabetes mellitus with manifestations ([place]) 01/08/2016   Visit for screening mammogram 10/14/2015   Atherosclerosis of native coronary artery of native heart without angina pectoris 10/14/2015   History of positive PPD 10/08/2015   COPD, severe ([place]) 09/04/2015   AAA (abdominal aortic aneurysm) without rupture ([place]) 05/13/2015   Multiple thyroid nodules 05/13/2015   OSA (obstructive sleep apnea) 11/15/2014   Essential hypertension 03/11/2014   Emphysema of lung ([place]) 03/11/2014   Osteoporosis 02/11/2014   Routine health maintenance 12/07/2011  DJD (degenerative joint disease) of knee    Hyperlipidemia with target LDL less than 100     Past Surgical History:  Procedure Laterality Date   APPENDECTOMY     2008   BREAST LUMPECTOMY Left 08/31/2017   BREAST LUMPECTOMY WITH RADIOACTIVE SEED LOCALIZATION Left 08/10/2017   Procedure: BREAST LUMPECTOMY WITH RADIOACTIVE SEED LOCALIZATION;  Surgeon: Rolm Bookbinder, MD;  Location: [facility];  Service: General;  Laterality: Left;   [place]   COLONOSCOPY     COLONOSCOPY WITH PROPOFOL N/A 04/16/2016   Procedure: COLONOSCOPY WITH PROPOFOL;  Surgeon: Carol Ada, MD;   Location: [facility];  Service: Endoscopy;  Laterality: N/A;   CYST REMOVAL NECK Left 04/13/2016   Procedure: EXCISION OF SEBACEOUS CYST LEFT POSTERIOR NECK;  Surgeon: Armandina Gemma, MD;  Location: [facility];  Service: General;  Laterality: Left;   Excision of Pelvic Absess, Right Ovary     2008   RE-EXCISION OF BREAST CANCER,SUPERIOR MARGINS Left 08/31/2017   Procedure: RE-EXCISION OF LEFT  BREAST MARGINS ERAS PATHWAY;  Surgeon: Rolm Bookbinder, MD;  Location: [facility];  Service: General;  Laterality: Left;   TUBAL LIGATION  1980     OB History   No obstetric history on file.     Family History  Problem Relation Age of Onset   Heart disease Mother        MI - fatal   Hypertension Mother    Stroke Father 40       fatal   Alzheimer's disease Father    Alzheimer's disease Brother    Hyperlipidemia Brother    Hypertension Brother    Diabetes Brother    Hypertension Brother    Hyperlipidemia Brother    Breast cancer Paternal Grandmother     Social History   Tobacco Use   Smoking status: Former    Packs/day: 1.00    Years: 50.00    Pack years: 50.00    Types: Cigarettes    Quit date: 04/16/2011    Years since quitting: 9.7   Smokeless tobacco: Never   Tobacco comments:    quit that date when she had to go to ER   Vaping Use   Vaping Use: Never used  Substance Use Topics   Alcohol use: Yes    Alcohol/week: 0.0 standard drinks    Comment: rare occasion   Drug use: No    Comment: no marijuana since 2012    Home Medications Prior to Admission medications   Medication Sig Start Date End Date Taking? Authorizing Provider  Accu-Chek Softclix Lancets lancets USE AS DIRECTED ONCE DAILY 01/21/21   Renato Shin, MD  acetaminophen (TYLENOL) 500 MG tablet Take 1,000 mg by mouth every 6 (six) hours as needed.    [provider]  albuterol (PROVENTIL) (2.5 MG/3ML) 0.083% nebulizer solution Take 3 mLs (2.5 mg total) by nebulization every 6 (six) hours as needed for wheezing  or shortness of breath. 05/14/20   Parrett, Fonnie Mu, NP  Albuterol Sulfate (PROAIR RESPICLICK) 539 (90 Base) MCG/ACT AEPB Inhale 1-2 puffs into the lungs every 6 (six) hours as needed (for wheezing/shortness of breath). 03/31/20   Brand Males, MD  amLODipine (NORVASC) 10 MG tablet Take 1 tablet (10 mg total) by mouth daily. 04/11/20   Janith Lima, MD  anastrozole (ARIMIDEX) 1 MG tablet Take 1 tablet by mouth once daily 04/14/20   Magrinat, Virgie Dad, MD  aspirin EC 81 MG tablet Take 1 tablet (81 mg  total) by mouth daily. 07/23/19   Kroeger, Lorelee Cover., PA-C  azithromycin (ZITHROMAX) 250 MG tablet Take 2 tablets on first day, then take 1 tablet daily until finished. 09/04/20   Brand Males, MD  Blood Glucose Monitoring Suppl (ACCU-CHEK AVIVA PLUS) w/Device KIT 1 Device by Does not apply route daily. Use to monitor glucose levels once per day; E11.9 07/09/19   Renato Shin, MD  carvedilol (COREG) 6.25 MG tablet Take 1 tablet (6.25 mg total) by mouth 2 (two) times daily. 04/23/20   Troy Sine, MD  Cholecalciferol (VITAMIN D) 125 MCG (5000 UT) CAPS Take 1 capsule by mouth daily.    [provider]  denosumab (PROLIA) 60 MG/ML SOLN injection Inject 60 mg into the skin every 6 (six) months. Administer in upper arm, thigh, or abdomen    [provider]  Fluticasone-Umeclidin-Vilant (TRELEGY ELLIPTA) 100-62.5-25 MCG/INH AEPB inhale ONE DOSE into THE lungs ONCE DAILY 12/21/20   Brand Males, MD  glucose blood (ACCU-CHEK GUIDE) test strip USE 1 STRIP ONCE DAILY 04/09/20   Renato Shin, MD  glucose blood test strip USE 1 STRIP ONCE DAILY 02/29/20   Renato Shin, MD  OXYGEN Inhale 2-3 L into the lungs continuous. When exerting self    [provider]  potassium chloride SA (KLOR-CON) 20 MEQ tablet Take 1 tablet (20 mEq total) by mouth 2 (two) times daily. 04/11/20   Janith Lima, MD  roflumilast (DALIRESP) 500 MCG TABS tablet Take 1 tablet (500 mcg total) by mouth  daily. 11/18/20   Brand Males, MD  rosuvastatin (CRESTOR) 40 MG tablet Take 1 tablet (40 mg total) by mouth daily. 08/07/20   Janith Lima, MD  SitaGLIPtin-MetFORMIN HCl (JANUMET XR) 50-1000 MG TB24 Take 2 tablets by mouth daily. 04/18/20   Renato Shin, MD  spironolactone (ALDACTONE) 25 MG tablet Take 1 tablet (25 mg total) by mouth daily. 04/11/20   Janith Lima, MD  thiamine (VITAMIN B-1) 100 MG tablet Take 1 tablet (100 mg total) by mouth every other day. 12/24/19   Janith Lima, MD    Allergies    Benicar [olmesartan], [name] [valsartan], Hydrocodone-acetaminophen, Lisinopril, Monosodium glutamate, Codeine, Lead acetate, and Nickel  Review of Systems   Review of Systems  Constitutional:  Negative for chills and fever.  HENT:  Negative for ear pain and sore throat.   Eyes:  Negative for pain and visual disturbance.  Respiratory:  Negative for cough and shortness of breath.   Cardiovascular:  Negative for chest pain and palpitations.  Gastrointestinal:  Negative for abdominal pain and vomiting.  Genitourinary:  Negative for dysuria and hematuria.  Musculoskeletal:  Negative for arthralgias and back pain.  Skin:  Negative for color change and rash.  Neurological:  Positive for speech difficulty and headaches. Negative for seizures, syncope and weakness.  All other systems reviewed and are negative.  Physical Exam Updated Vital Signs BP (!) 177/71 (BP Location: Left Arm)   Pulse 82   Temp 98.7 F (37.1 C) (Oral)   Resp 16   Ht 5' 4.5" (1.638 m)   Wt 94.8 kg   SpO2 99%   BMI 35.32 kg/m   Physical Exam Vitals and nursing note reviewed.  Constitutional:      General: She is not in acute distress.    Appearance: She is well-developed.  HENT:     Head: Normocephalic and atraumatic.  Eyes:     General: Vision grossly intact. No visual field deficit.    Extraocular  Movements:     Right eye: Normal extraocular motion and no nystagmus.     Left eye: Normal  extraocular motion and no nystagmus.     Conjunctiva/sclera: Conjunctivae normal.  Cardiovascular:     Rate and Rhythm: Normal rate and regular rhythm.     Heart sounds: No murmur heard. Pulmonary:     Effort: Pulmonary effort is normal. No respiratory distress.     Breath sounds: Normal breath sounds.  Abdominal:     Palpations: Abdomen is soft.     Tenderness: There is no abdominal tenderness.  Musculoskeletal:     Cervical back: Neck supple.  Skin:    General: Skin is warm and dry.  Neurological:     Mental Status: She is alert and oriented to person, place, and time.     GCS: GCS eye subscore is 4. GCS verbal subscore is 5. GCS motor subscore is 6.     Cranial Nerves: No cranial nerve deficit, dysarthria or facial asymmetry.     Sensory: No sensory deficit.     Motor: No weakness.     Coordination: Coordination normal.     Comments: MENTAL STATUS EXAM:    Orientation: Alert and oriented to person, place and time.  Memory: Cooperative, follows commands well.  Language: Speech is clear and language is normal. Able to recount phrases when prompted and name objects. No aphasia.  CRANIAL NERVES:    CN 2 (Optic): Visual fields intact to confrontation.  CN 3,4,6 (EOM): Pupils equal and reactive to light. Full extraocular eye movement without nystagmus.  CN 5 (Trigeminal): Facial sensation is normal, no weakness of masticatory muscles.  CN 7 (Facial): No facial weakness or asymmetry.  CN 8 (Auditory): Auditory acuity grossly normal.  CN 9,10 (Glossophar): The uvula is midline, the palate elevates symmetrically.  CN 11 (spinal access): Normal sternocleidomastoid and trapezius strength.  CN 12 (Hypoglossal): The tongue is midline. No atrophy or fasciculations.[name]   MOTOR:  Muscle Strength: 5/5RUE, 5/5LUE, 5/5RLE, 5/5LLE  REFLEXES: Not assessed.   COORDINATION:   Intact finger-to-nose, no tremor, no pronator drift.   SENSATION:   Intact to light touch all four extremities.  GAIT:  Gait not assessed     ED Results / Procedures / Treatments   Labs (all labs ordered are listed, but only abnormal results are displayed) Labs Reviewed  URINALYSIS, ROUTINE W REFLEX MICROSCOPIC - Abnormal; Notable for the following components:      Result Value   Color, Urine YELLOW (*)    APPearance CLEAR (*)    Hgb urine dipstick TRACE (*)    Bacteria, UA RARE (*)    All other components within normal limits  CBC - Abnormal; Notable for the following components:   WBC 11.6 (*)    All other components within normal limits  COMPREHENSIVE METABOLIC PANEL - Abnormal; Notable for the following components:   Glucose, Bld 183 (*)    All other components within normal limits  CBG MONITORING, ED - Abnormal; Notable for the following components:   Glucose-Capillary 192 (*)    All other components within normal limits  CBG MONITORING, ED - Abnormal; Notable for the following components:   Glucose-Capillary 139 (*)    All other components within normal limits  RESP PANEL BY RT-PCR (FLU A&B, COVID) ARPGX2  RAPID URINE DRUG SCREEN, HOSP PERFORMED  ETHANOL  PROTIME-INR  APTT  HEMOGLOBIN A1C  CBG MONITORING, ED    EKG None  Radiology CT HEAD WO CONTRAST (5MM)  Result Date: 01/25/2021 CLINICAL DATA:  Headache and memory issues. EXAM: CT HEAD WITHOUT CONTRAST TECHNIQUE: Contiguous axial images were obtained from the base of the skull through the vertex without intravenous contrast. COMPARISON:  None. FINDINGS: Brain: No evidence of acute infarction, hemorrhage, hydrocephalus, extra-axial collection or mass lesion/mass effect. Mild generalized cerebral atrophy. Scattered mild periventricular and subcortical white matter hypodensities are nonspecific, but favored to reflect chronic microvascular ischemic changes. Vascular: Atherosclerotic vascular calcification of the carotid siphons. No hyperdense vessel. Skull: Normal. Negative for fracture or focal lesion. Sinuses/Orbits: No acute finding.  Other: None. IMPRESSION: 1. No acute intracranial abnormality. 2. Mild cerebral atrophy and chronic microvascular ischemic changes. Electronically Signed   By: Titus Dubin M.D.   On: 01/25/2021 13:20   MR ANGIO HEAD WO CONTRAST  Result Date: 01/25/2021 CLINICAL DATA:  Headache, chronic, no new features EXAM: MRA HEAD WITHOUT CONTRAST TECHNIQUE: Angiographic images of the Circle of Willis were acquired using MRA technique without intravenous contrast. COMPARISON:  No pertinent prior exam. FINDINGS: Moderately motion limited study. Anterior circulation: Bilateral intracranial ICAs, MCAs are patent without high-grade proximal stenosis. The right ACA is difficult to visualize (particularly distally); however, this could be related in part to congenitally small ACA given suspected partially azygous ACA with prominent left A1 ACA. Superimposed stenosis is not excluded. The left ACA is patent without proximal high-grade stenosis. Limited evaluation of distal vessel due to motion. Posterior circulation: Motion limited evaluation of the proximal intradural vertebral arteries bilaterally. The left vertebral artery is small/non dominant and poorly assessed. The right vertebral artery is patent. The basilar artery and proximal posterior cerebral arteries are patent without high-grade stenosis. Right-sided posterior communicating artery with small or absent right P1 PCA, anatomic variant. Limited evaluation of distal PCAs due to motion. Anatomic variants: See above. IMPRESSION: 1. Moderate patient motion, limiting evaluation for stenosis or aneurysm. 2. The right ACA is difficult to visualize (particularly distally); however, this could be related in part to congenitally small ACA given suspected partially azygous ACA and prominent left A1 ACA. A right ACA stenosis is not excluded. 3. Otherwise, no large vessel occlusion or evidence of proximal high-grade stenosis. Electronically Signed   By: Margaretha Sheffield M.D.   On:  01/25/2021 15:07   MR BRAIN WO CONTRAST  Result Date: 01/25/2021 CLINICAL DATA:  Transient ischemic attack (TIA) EXAM: MRI HEAD WITHOUT CONTRAST TECHNIQUE: Multiplanar, multiecho pulse sequences of the brain and surrounding structures were obtained without intravenous contrast. COMPARISON:  Same day head CT. FINDINGS: Mildly motion limited exam.  Within this limitation: Brain: No acute infarction, hemorrhage, hydrocephalus, extra-axial collection or mass lesion. Mild-to-moderate scattered T2 hyperintensities within the white matter, nonspecific but compatible with chronic microvascular ischemic disease. Mild for age atrophy with ex vacuo ventricular dilation. Vascular: Further evaluated on concurrent MRA. Skull and upper cervical spine: Normal marrow signal. Sinuses/Orbits: Mild paranasal sinus mucosal thickening. No acute orbital findings. Other: No mastoid effusions. IMPRESSION: 1. No evidence of acute intracranial abnormality. 2. Mild-to-moderate chronic microvascular ischemic disease and mild atrophy. Electronically Signed   By: Margaretha Sheffield M.D.   On: 01/25/2021 14:51   CT ANGIO HEAD NECK W WO CM W PERF (CODE STROKE)  Result Date: 01/25/2021 CLINICAL DATA:  Neuro deficit, acute, stroke suspected EXAM: CT ANGIOGRAPHY HEAD AND NECK TECHNIQUE: Multidetector CT imaging of the head and neck was performed using the standard protocol during bolus administration of intravenous contrast. Multiplanar CT image reconstructions and MIPs were obtained to evaluate the vascular anatomy. Carotid stenosis  measurements (when applicable) are obtained utilizing NASCET criteria, using the distal internal carotid diameter as the denominator. Multiphase CT imaging of the brain was performed following IV bolus contrast injection. Subsequent parametric perfusion maps were calculated using RAPID software. CONTRAST:  123m OMNIPAQUE IOHEXOL 350 MG/ML SOLN COMPARISON:  Same day MRI/MRA. FINDINGS: CT HEAD FINDINGS Brain: No  evidence of acute large vascular territory infarction, hemorrhage, hydrocephalus, extra-axial collection or mass lesion/mass effect. Mild-to-moderate patchy white matter hypoattenuation, nonspecific but compatible with chronic microvascular ischemic disease. Mild atrophy. Vascular: See below. Skull: No acute fracture. Sinuses: Mild paranasal sinus mucosal thickening. Orbits: No acute finding. Review of the MIP images confirms the above findings CTA NECK FINDINGS Aortic arch: Calcific and noncalcific atherosclerosis of the aorta. Great vessel origins are patent. Right carotid system: Motion limited evaluation. The common carotid artery and internal carotid artery are patent without evidence of significant (greater than 50%) stenosis. Mild atherosclerosis at the carotid bifurcation. Left carotid system: Motion limited evaluation. Nondiagnostic evaluation of portions of the common carotid artery due to severe motion. Otherwise, the visible portions of the left common carotid artery and internal carotid artery are patent. There is moderate calcific atherosclerosis at the carotid bifurcation with proximally 40% stenosis. Vertebral arteries: Motion limited evaluation of the vertebral artery origins with potentially moderate right vertebral artery origin stenosis. The left vertebral artery is small throughout its course. Skeleton: Multilevel degenerative change of the cervical spine with likely at least moderate canal and bilateral foraminal stenosis C4-C5. Other neck: Heterogeneous enlarged thyroid gland with thyroid nodules that are poorly evaluated due to motion. The thyroid is better evaluated on ultrasound from Oct 13, 2018. Upper chest: Emphysema.  Visualized lung apices are clear. Review of the MIP images confirms the above findings CTA HEAD FINDINGS Anterior circulation: Motion limited evaluation of the intracranial ICAs. Calcific atherosclerosis of the intracranial ICAs with likely mild narrowing bilaterally.  Bilateral M1 MCAs and proximal M2 MCAs are patent without high-grade stenosis. The right ACA is smal (similar to recent MRA) with prominent left ACA and likely partly azygous and potentially congenital. Posterior circulation: Small left intradural vertebral artery, which appears to largely terminate as PICA. Mild atherosclerotic narrowing of the right proximal intradural vertebral artery. The basilar artery and bilateral posterior cerebral arteries are patent without proximal hemodynamically significant stenosis. Right posterior communicating artery with small right P1 PCA, anatomic variant. Venous sinuses: As permitted by contrast timing, patent. Small left transverse sinus. Anatomic variants: As detailed above. Review of the MIP images confirms the above findings CT Brain Perfusion Findings: ASPECTS: 10 CBF (<30%) Volume: 036mPerfusion (Tmax>6.0s) volume: 2068mismatch Volume: 20ML. Located in the anterior left temporal lobe and anterior/inferior right frontal lobe. Infarction Location:None. IMPRESSION: CT head: 1. No evidence of acute large vascular territory infarct or acute hemorrhage. ASPECTS 10. 2. Mild-to-moderate chronic microvascular ischemic disease. CTA head: 1. No large vessel occlusion or proximal hemodynamically significant stenosis. 2. 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 CTA Neck: 1. Motion limited evaluation with nondiagnostic evaluation in areas, as detailed above. 2. Bilateral carotid bifurcation atherosclerosis with approximately 40% stenosis of the left ICA origin. 3. Motion limited evaluation of the vertebral artery origins with potentially moderate right vertebral artery origin stenosis. The left vertebral artery is small throughout its course. 4. Multilevel degenerative change of the cervical spine with likely at least moderate canal and bilateral foraminal stenosis C4-C5. An MRI of the cervical spine could  further characterize the canal and foramina if clinically indicated. 5. Heterogeneous enlarged thyroid gland with thyroid nodules that are poorly evaluated due to motion. The thyroid is better evaluated on ultrasound from Oct 13, 2018. 6. Aortic Atherosclerosis (ICD10-I70.0) and Emphysema (ICD10-J43.9). CT Perfusion: 1. RAPID detects approximally 20 mL of mismatch in the left temporal lobe and inferior right frontal lobe. This finding is indeterminate, but may be in part artifactual given involvement of two different vascular territories and given mismatches in areas that are prone to artifact (inferior/anterior temporal and frontal regions). A repeat MRI could further evaluate if clinically indicated. 2. No evidence of core infarct. Code stroke imaging results were communicated on 01/25/2021 at 4:30 pm to provider Dr. Maurine Minister Via telephone, who verbally acknowledged these results. Electronically Signed   By: Margaretha Sheffield M.D.   On: 01/25/2021 16:56    Procedures Procedures   Medications Ordered in ED Medications  acetaminophen (TYLENOL) tablet 1,000 mg (1,000 mg Oral Given 01/25/21 1259)  prochlorperazine (COMPAZINE) injection 10 mg (10 mg Intravenous Given 01/25/21 1259)  lactated ringers bolus 500 mL (0 mLs Intravenous Stopped 01/25/21 1552)  iohexol (OMNIPAQUE) 350 MG/ML injection 100 mL (100 mLs Intravenous Contrast Given 01/25/21 1606)    ED Course  I have reviewed the triage vital signs and the nursing notes.  Pertinent labs & imaging results that were available during my care of the patient were reviewed by me and considered in my medical decision making (see chart for details).    MDM Rules/Calculators/A&P                           75 year old female with a history of abdominal aneurysm, breast cancer, COPD, HLD, HTN, OSA who presents to the emergency department due to concern for speech abnormality.  The history is provided by the patient and the patient's niece.  She was last known  normal around 9:30 AM this morning.  She was noted to have some garbled and confused speech.  She was also having some word finding difficulty.  She was brought to the [facility] emergency department where on arrival her symptoms had resolved by the time of her assessment in triage.  On my initial evaluation, the patient did not have slurred speech, dysarthria or word finding difficulties.  She arrived ABC intact, GCS 15, neurologically intact.  Additionally, the patient endorses a frontal headache with no radiation.  She endorses no vision changes or visual field deficit.  No blurry vision.  No fever or chills.  No neck pain or stiffness.  No nausea or vomiting.    Symptoms are most consistent with TIA.  Additionally considered focal seizure, electrolyte abnormality, complex migraine, other toxic/metabolic derangement, infectious etiology resulting in confusion and confused speech.  On my initial evaluation following the patient being bedded into her room,, the patient's niece did express concern that her symptoms had not resolved.  I spoke with the triage nurse who conveyed his assessment was notable for clear speech with no confusion or garbled speech, no other focal neurologic deficit.  On my initial exam, the patient also had no garbled speech, speaking clearly in full sentences without dysarthria.  No facial droop.  No cranial nerve deficit.  No weakness or numbness or pronator drift.  No dysmetria.  Code stroke not activated.  Felt symptoms more consistent with TIA given resolution at that time.  Patient following commands and appears to be at baseline mental status. No aphasia. Work-up  initiated to include CT head, screening labs, MRI and MRA.  CBG initially 192.  The patient's headache was treated with Tylenol, IV Compazine, and an IV fluid bolus.  CT head performed without acute intracranial abnormality, mild cerebral atrophy and chronic microvascular ischemic changes noted.  MRI brain and  angio head without contrast with no evidence of acute intracranial abnormality, mild to moderate chronic microvascular ischemic disease and mild atrophy noted, MRA somewhat limited by patient motion with difficult to visualize right ACA but otherwise no large vessel occlusion or evidence of proximal high-grade stenosis.  1525 On reassessment following her MRI/MRA, the patient was found to have a return of her symptoms.  When asked to repeat a sentence "the clock on the wall is black" the patient was unable to do so.  She had a return of expressive aphasia.  Dr. Darl Householder present with myself bedside to reevaluate the patient.  The decision was made to page neurology and call out code stroke.  I spoke with Dr. Curly Shores who recommended CT angio of the head and neck and CT perfusion study.  These results were pending at time of signout.  Final Clinical Impression(s) / ED Diagnoses Final diagnoses:  Confusion  Word finding difficulty  Seizure [facility])  Speech disturbance, unspecified type    Rx / DC Orders ED Discharge Orders     None        Regan Lemming, MD 01/25/21 [place]    Regan Lemming, MD 01/25/21 1835

## 2021-01-25 NOTE — ED Notes (Signed)
Report given to [place] RN at [employer] [facility]

## 2021-01-25 NOTE — ED Notes (Signed)
[facility] in ED at this time to transport patient

## 2021-01-25 NOTE — Consult Note (Signed)
[facility] Telemedicine Consult  Requesting Provider: Dr. Darl Householder Consult Participants: Myself  Location of the provider: [facility]  Location of the patient: [place]  This consult was provided via telemedicine with 2-way video and audio communication. The patient/family was informed that care would be provided in this way and agreed to receive care in this manner.    Chief Complaint: Mixed up speech  HPI: Victoria [name] is a 75 y.o. woman with a past medical history significant for hypertension, hyperlipidemia, severe COPD, sleep apnea on CPAP, obesity (BMI 35.32), possible abdominal aorta aneurysm not recently imaged, headaches, breast cancer (left side)  Regarding her malignancy history, she was diagnosed after mammogram showed an abnormality in January 2019 with invasive mammary carcinoma grade 1 ER positive and progesterone receptor positive s/p resection without chemotherapy or radiation therapy on anastrozole (3 out of planned 5 years completed as of 10/07/2020)  She lives independently and has an ECOG 2 per oncology notes.  She talks to many people daily -- was talking to family member today at 9:40 AM who was concerned about the patient's speech from the beginning of the call and asked the patient's niece to go and check in on her.  [name] niece reports that the patient has had word finding difficulty continuously all day, but the severity of her speech impairment has waxed and waned; niece brought her to ED for further evaluation and notes patient was otherwise completely her normal self. She did additionally complain of a severe headache only once they were in the ED that was associated with some emesis.  She notes that the patient has not had anything to eat today and this headache was left-sided involving the sinus and temples.  Patient reports the headache is much improved and almost resolved at this time.  Patient and family deny any other symptoms at this time or  recently, though patient reports she has not noticed left leg weakness before and there is some drift on my exam.   Code stroke was not initially activated by ED provider on patient's arrival given that the symptoms were felt to have resolved, but there was an attempted activation after MRI was completed due to worsening aphasia.  Please note that the activation page did not go through normal channels.  LKW: 9/10 during the day tpa given?: No, due to out of the window and no stroke on MRI IR Thrombectomy? No, no LVO Modified Rankin Scale: 2-Slight disability-UNABLE to perform all activities but does not need assistance (extrapolated from ECOG 2) Time of teleneurologist evaluation: 2:45 PM, 8 minutes after none emergent page was received   Past Medical History:  Diagnosis Date   Abdominal aneurysm ([place])    Dr. Oneida Alar follows lLOV 2 ''17 per pt "around 2 cm"   Anemia    as a child   Arthritis    left ankle, right knee, right SI joint, wrists, lower back   Breast cancer in female [facility])    Left   COPD (chronic obstructive pulmonary disease) ([place])    ephysema-Dr. 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 Harlow Asa in [facility]   Sleep apnea    cpap used sometimes, uses oxygen concentrator 2 l/m nasally bedtime   Varicella as child   Past Surgical History:  Procedure Laterality Date   APPENDECTOMY     2008   BREAST LUMPECTOMY Left 08/31/2017   BREAST LUMPECTOMY WITH RADIOACTIVE SEED LOCALIZATION Left 08/10/2017   Procedure: BREAST LUMPECTOMY WITH RADIOACTIVE SEED LOCALIZATION;  Surgeon:  Rolm Bookbinder, MD;  Location: [place];  Service: General;  Laterality: Left;   [place]   COLONOSCOPY     COLONOSCOPY WITH PROPOFOL N/A 04/16/2016   Procedure: COLONOSCOPY WITH PROPOFOL;  Surgeon: Carol Ada, MD;  Location: [facility];  Service: Endoscopy;  Laterality: N/A;   CYST REMOVAL NECK Left 04/13/2016   Procedure: EXCISION OF SEBACEOUS CYST LEFT POSTERIOR NECK;  Surgeon: Armandina Gemma, MD;  Location: [place];  Service: General;  Laterality: Left;   Excision of Pelvic Absess, Right Ovary     2008   RE-EXCISION OF BREAST CANCER,SUPERIOR MARGINS Left 08/31/2017   Procedure: RE-EXCISION OF LEFT  BREAST MARGINS ERAS PATHWAY;  Surgeon: Rolm Bookbinder, MD;  Location: [place];  Service: General;  Laterality: Left;   [place]    Not yet verified  Current Outpatient Medications  Medication Instructions   Accu-Chek Softclix Lancets lancets USE AS DIRECTED ONCE DAILY   acetaminophen (TYLENOL) 1,000 mg, Oral, Every 6 hours PRN   albuterol (PROVENTIL) 2.5 mg, Nebulization, Every 6 hours PRN   Albuterol Sulfate (PROAIR RESPICLICK) 767 (90 Base) MCG/ACT AEPB 1-2 puffs, Inhalation, Every 6 hours PRN   amLODipine (NORVASC) 10 mg, Oral, Daily   anastrozole (ARIMIDEX) 1 MG tablet Take 1 tablet by mouth once daily   aspirin EC 81 mg, Oral, Daily   azithromycin (ZITHROMAX) 250 MG tablet Take 2 tablets on first day, then take 1 tablet daily until finished.   Blood Glucose Monitoring Suppl (ACCU-CHEK AVIVA PLUS) w/Device KIT 1 Device, Does not apply, Daily, Use to monitor glucose levels once per day; E11.9   carvedilol (COREG) 6.25 mg, Oral, 2 times daily   Cholecalciferol (VITAMIN D) 125 MCG (5000 UT) CAPS 1 capsule, Oral, Daily   Daliresp 500 mcg, Oral, Daily   denosumab (PROLIA) 60 mg, Subcutaneous, Every 6 months, Administer in upper arm, thigh, or abdomen    Fluticasone-Umeclidin-Vilant (TRELEGY ELLIPTA) 100-62.5-25 MCG/INH AEPB inhale ONE DOSE into THE lungs ONCE  DAILY   glucose blood (ACCU-CHEK GUIDE) test strip USE 1 STRIP ONCE DAILY   glucose blood test strip USE 1 STRIP ONCE DAILY   OXYGEN 2-3 L, Inhalation, Continuous, When exerting self    potassium chloride SA (KLOR-CON) 20 MEQ tablet 20 mEq, Oral, 2 times daily   rosuvastatin (CRESTOR) 40 mg, Oral, Daily   SitaGLIPtin-MetFORMIN HCl (JANUMET XR) 50-1000 MG TB24 2 tablets, Oral, Daily   spironolactone (ALDACTONE) 25 mg, Oral, Daily   thiamine (VITAMIN B-1) 100 mg, Oral, Every other day      Exam: Vitals:   01/25/21 1500 01/25/21 1530  BP: (!) 163/72 (!) 176/74  Pulse: 70 74  Resp: 16 16  Temp:    SpO2: 97% 95%    General: Mildly irritable/uncomfortable but cooperative and redirectable Pulmonary: Tachypneic at rest Cardiac: regular rate and rhythm on EKG  NIH Stroke scale 1A: Level of Consciousness - 0 1B: Ask Month and Age -  0 1C: 'Blink Eyes' & 'Squeeze Hands' - 0 2: Test Horizontal Extraocular Movements - 0 3: Test Visual Fields - 0 4: Test Facial Palsy - 0 5A: Test Left Arm Motor Drift - 0 5B: Test Right Arm Motor Drift - 0 6A: Test Left Leg Motor Drift - 1 6B: Test Right Leg Motor Drift - 0 7: Test Limb Ataxia - 0 8: Test Sensation - 0 9: Test Language/Aphasia- 1 -- can name and repeat simple things but struggles with low-frequency objects and complex repetition 10: Test Dysarthria - 0 11: Test Extinction/Inattention - 0 NIHSS score: 2   Imaging Reviewed:   Head CT negative for acute intracranial process MRI obtained prior to code stroke activation negative for acute intracranial process, personally reviewed, no clear mass or seizure focus with some chronic microvascular changes MRA significantly motion limited without clear LVO CTA also somewhat motion limited but no large vessel occlusion or hemodynamically's significant stenosis.  The right ACA is hypoplastic with some contribution from the left ACA distally CT perfusion with some left temporal hypoperfusion as  well as rapid detection of some mismatch in the right inferior frontal lobe in a nonphysiologic area  Labs reviewed in epic and pertinent values follow:  Last ECHO 2020  1. The left ventricle has hyperdynamic systolic function, with an  ejection fraction of >65%. The cavity size was normal. There is moderately  increased left ventricular wall thickness. Left ventricular diastolic  Doppler parameters are consistent with  impaired relaxation. Elevated left ventricular end-diastolic pressure The  E/e' is >15. No evidence of left ventricular regional wall motion  abnormalities.   2. The right ventricle has normal systolic function. The cavity was  normal. There is no increase in right ventricular wall thickness.   3. The mitral valve is abnormal. Mild thickening of the mitral valve  leaflet. There is mild mitral annular calcification present.   4. The tricuspid valve is grossly normal.   5. The aortic valve was not well visualized. Mild calcification of the  aortic valve. No stenosis of the aortic valve.  Normal biatrial sizes  Lab Results  Component Value Date   CHOL 170 12/24/2019   HDL 48 (L) 12/24/2019   LDLCALC 92 12/24/2019   LDLDIRECT 121.0 09/05/2014   TRIG 201 (H) 12/24/2019   CHOLHDL 3.5 12/24/2019   Lab Results  Component Value Date   HGBA1C 6.4 (A) 08/21/2020      Assessment: Unclear etiology of this patient's aphasia though given imaging findings to date suspect she may be having focal seizures.  Given her age and comorbidities there is a high risk of secondary generalization especially given her speech difficulties have been ongoing all day suggestive of potential focal status.  We will emergently transfer to [facility] ED for EEG hookup with MRI compatible leads in case repeat MRI with contrast might be helpful.   Recommendations:  -Emergent transfer to [facility], ED -Dr. Cheral Marker of neurology at [facility] aware, as well as Dr. Darl Householder -Stat EEG with high likelihood  of conversion to long-term EEG monitoring -- will need non-hallway bed for hookup -If needed, 2 mg IV Versed for generalized tonic-clonic seizure activity greater than 5 minutes given Ativan shortages -Seizure precautions -Further recommendations per [facility] neurology evaluation  This patient is receiving care for possible acute neurological changes. There was 50 minutes of care by this provider at the time of service, including time for direct evaluation via telemedicine, review of medical records, imaging studies and  discussion of findings with providers, the patient and/or family.  Lesleigh Noe MD-PhD [facility] [phone] If 8pm-8am, please page neurology on call as listed in [place].  CRITICAL CARE Performed by: Lorenza Chick   Total critical care time: 50 minutes  Critical care time was exclusive of separately billable procedures and treating other patients.  Critical care was necessary to treat or prevent imminent or life-threatening deterioration -- concern for acute stroke or focal status with potential to progress to generalized seizure activity  Critical care was time spent personally by me on the following activities: development of treatment plan with patient and/or surrogate as well as nursing, discussions with consultants, evaluation of patient's response to treatment, examination of patient, obtaining history from patient or surrogate, ordering and performing treatments and interventions, ordering and review of laboratory studies, ordering and review of radiographic studies, pulse oximetry and re-evaluation of patient's condition.

## 2021-01-25 NOTE — Progress Notes (Signed)
Same day progress note Please see the detailed consultation note by Dr. Curly Shores from earlier this afternoon. Patient seen and examined Continues to have word finding difficulty while making longer sentences.  No other deficits on exam. Plan is to hook up to LTM EEG given negative MRI to evaluate for underlying seizure activity On-call MRI technologist made aware and on his way for EEG hookup neurology will continue to follow.  No need for antiepileptics yet. Will update plan as needed.  -- Amie Portland, MD Neurologist [facility] Pager: [phone]

## 2021-01-25 NOTE — ED Triage Notes (Signed)
Pt states she woke up feeling her normal self this morning. Pt states around 0940 she began having a headache and had difficulty forming her words. Symptoms appear to have resolved. No other deficits noted.

## 2021-01-25 NOTE — Progress Notes (Signed)
EEG complete - results pending 

## 2021-01-25 NOTE — ED Notes (Signed)
Pt brought from [facility] hospital, awaiting neurology.

## 2021-01-25 NOTE — Plan of Care (Signed)
Prelim EEG negative for seizures. Reader notified. Pending formal read. Continue LTM Neurology will follow.  -- Amie Portland, MD Neurologist [facility] Pager: [phone]

## 2021-01-25 NOTE — H&P (Addendum)
TRH H&P    Patient Demographics:    Victoria [name], is a 75 y.o. female  MRN: [id number]  DOB - May 04, 1946  Admit Date - 01/25/2021  Referring MD/NP/PA: Wyn Quaker  Outpatient Primary MD for the patient is Janith Lima, MD  Patient coming from: Home  Chief complaint-word finding difficulty   HPI:    Victoria [name]  is a 75 y.o. female, with history of breast cancer, COPD, hyperlipidemia, hypertension, obstructive sleep apnea came to ED with complaints of speech abnormality with word finding difficulty.  As per patient's niece patient was normal at 9:30 AM and then she was noted to have some garbled and confused speech.  She also had word finding difficulty.  She was brought to the ED for further evaluation.  She complained of frontal headache along with pain in her sinuses. She is chronically on home O2 due to COPD. She denies any seizure-like activity Denies nausea or diarrhea Had 2 episodes of vomiting today Denies fever or chills  In the ED CT head was unremarkable, tele neurology was consulted.  MRI brain was negative for stroke.  Neurology was consulted for possible seizure and recommended EEG and LTM EEG monitoring.  [facility].    Review of systems:    In addition to the HPI above,    All other systems reviewed and are negative.    Past History of the following :    Past Medical History:  Diagnosis Date   Abdominal aneurysm ([place])    Dr. Oneida Alar follows lLOV 2 ''17 per pt "around 2 cm"   Anemia    as a child   Arthritis    left ankle, right knee, right SI joint, wrists, lower back   Breast cancer in female [facility])    Left   COPD (chronic obstructive pulmonary disease) ([place])    ephysema-Dr. 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 Harlow Asa in [facility] [facility]   Sleep apnea    cpap used sometimes, uses oxygen concentrator 2 l/m nasally bedtime   Varicella as child      Past Surgical History:  Procedure Laterality Date   APPENDECTOMY     2008   BREAST LUMPECTOMY Left 08/31/2017   BREAST LUMPECTOMY WITH RADIOACTIVE SEED LOCALIZATION Left 08/10/2017   Procedure: BREAST LUMPECTOMY WITH RADIOACTIVE SEED LOCALIZATION;  Surgeon: Rolm Bookbinder, MD;  Location: [place];  Service: General;  Laterality: Left;   [place]  COLONOSCOPY WITH PROPOFOL N/A 04/16/2016   Procedure: COLONOSCOPY WITH PROPOFOL;  Surgeon: Carol Ada, MD;  Location: [facility];  Service: Endoscopy;  Laterality: N/A;   CYST REMOVAL NECK Left 04/13/2016   Procedure: EXCISION OF SEBACEOUS CYST LEFT POSTERIOR NECK;  Surgeon: Armandina Gemma, MD;  Location: [place];  Service: General;  Laterality: Left;   Excision of Pelvic Absess, Right Ovary     2008   RE-EXCISION OF BREAST CANCER,SUPERIOR MARGINS Left 08/31/2017   Procedure: RE-EXCISION OF LEFT  BREAST [place];  Surgeon: Rolm Bookbinder, MD;  Location: [place];  Service: General;  Laterality: Left;   TUBAL LIGATION  1980      Social History:      Social History   Tobacco Use   Smoking status: Former    Packs/day: 1.00    Years: 50.00    Pack years: 50.00    Types: Cigarettes    Quit date: 04/16/2011    Years since quitting: 9.7   Smokeless tobacco: Never   Tobacco comments:    quit that date when she had to go to ER   Substance Use Topics   Alcohol use: Yes    Alcohol/week: 0.0 standard drinks    Comment: rare occasion       Family History :      Family History  Problem Relation Age of Onset   Heart disease Mother        MI - fatal   Hypertension Mother    Stroke Father 106       fatal   Alzheimer's disease Father    Alzheimer's disease Brother    Hyperlipidemia Brother    Hypertension Brother    Diabetes Brother    Hypertension Brother    Hyperlipidemia Brother    Breast cancer Paternal Grandmother       Home Medications:   Prior to Admission medications   Medication Sig Start Date End Date Taking? Authorizing Provider  Accu-Chek Softclix Lancets lancets USE AS DIRECTED ONCE DAILY 01/21/21   Renato Shin, MD  acetaminophen (TYLENOL) 500 MG tablet Take 1,000 mg by mouth every 6 (six) hours as needed.    [provider]  albuterol (PROVENTIL) (2.5 MG/3ML) 0.083% nebulizer solution Take 3 mLs (2.5 mg total) by nebulization every 6 (six) hours as needed for wheezing or shortness of breath. 05/14/20   Parrett, Fonnie Mu, NP  Albuterol Sulfate (PROAIR RESPICLICK) 081 (90 Base) MCG/ACT AEPB Inhale 1-2 puffs into the lungs every 6 (six) hours as needed (for wheezing/shortness of breath). 03/31/20   Brand Males, MD  amLODipine (NORVASC) 10 MG tablet Take 1 tablet (10 mg total) by mouth daily. 04/11/20   Janith Lima, MD  anastrozole (ARIMIDEX) 1 MG tablet Take 1 tablet by mouth once daily 04/14/20   Magrinat, Virgie Dad, MD  aspirin EC 81 MG tablet Take 1 tablet (81 mg total) by mouth daily. 07/23/19   Kroeger, Lorelee Cover., PA-C  azithromycin (ZITHROMAX) 250 MG tablet Take 2 tablets on first day, then take 1 tablet daily until finished. 09/04/20   Brand Males, MD  Blood Glucose Monitoring Suppl (ACCU-CHEK AVIVA PLUS) w/Device KIT 1 Device by Does not apply route daily. Use to monitor glucose levels once per day; E11.9 07/09/19   Renato Shin, MD  carvedilol (COREG) 6.25 MG tablet Take 1 tablet (6.25 mg total) by mouth 2 (two) times daily. 04/23/20   Troy Sine, MD  Cholecalciferol (VITAMIN D) 125 MCG (5000 UT) CAPS  Take  1 capsule by mouth daily.    [provider]  denosumab (PROLIA) 60 MG/ML SOLN injection Inject 60 mg into the skin every 6 (six) months. Administer in upper arm, thigh, or abdomen    [provider]  Fluticasone-Umeclidin-Vilant (TRELEGY ELLIPTA) 100-62.5-25 MCG/INH AEPB inhale ONE DOSE into THE lungs ONCE DAILY 12/21/20   Brand Males, MD  glucose blood (ACCU-CHEK GUIDE) test strip USE 1 STRIP ONCE DAILY 04/09/20   Renato Shin, MD  glucose blood test strip USE 1 STRIP ONCE DAILY 02/29/20   Renato Shin, MD  OXYGEN Inhale 2-3 L into the lungs continuous. When exerting self    [provider]  potassium chloride SA (KLOR-CON) 20 MEQ tablet Take 1 tablet (20 mEq total) by mouth 2 (two) times daily. 04/11/20   Janith Lima, MD  roflumilast (DALIRESP) 500 MCG TABS tablet Take 1 tablet (500 mcg total) by mouth daily. 11/18/20   Brand Males, MD  rosuvastatin (CRESTOR) 40 MG tablet Take 1 tablet (40 mg total) by mouth daily. 08/07/20   Janith Lima, MD  SitaGLIPtin-MetFORMIN HCl (JANUMET XR) 50-1000 MG TB24 Take 2 tablets by mouth daily. 04/18/20   Renato Shin, MD  spironolactone (ALDACTONE) 25 MG tablet Take 1 tablet (25 mg total) by mouth daily. 04/11/20   Janith Lima, MD  thiamine (VITAMIN B-1) 100 MG tablet Take 1 tablet (100 mg total) by mouth every other day. 12/24/19   Janith Lima, MD     Allergies:     Allergies  Allergen Reactions   Benicar [Olmesartan] Swelling    Swelling of face and arms    [name] [Valsartan] Swelling    Swelling of face and arms    Hydrocodone-Acetaminophen Nausea And Vomiting    Severe vomiting   Lisinopril Cough   Monosodium Glutamate Other (See Comments)    Facial swelling per pt   Codeine Other (See Comments)    jittery   Lead Acetate Rash   Nickel Rash    Severe rash to infection: pt is allergic to all metals other than sterling silver or gold jewelry.      Physical Exam:   Vitals  Blood pressure  (!) 185/82, pulse 92, temperature 98.7 F (37.1 C), temperature source Oral, resp. rate (!) 23, height 5' 4.5" (1.638 m), weight 94.8 kg, SpO2 96 %.  1.  General: Appears in no acute distress  2. Psychiatric: Alert, oriented x3, intact insight and judgment  3. Neurologic: Cranial nerves II through XII grossly intact, no focal deficit noted, motor strength 5/5 in all extremities, sensations intact bilaterally  4. HEENMT:  Atraumatic normocephalic, extraocular muscles are intact  5. Respiratory : Clear to auscultation bilaterally  6. Cardiovascular : S1-S2, regular, no murmur auscultated  7. Gastrointestinal:  Abdomin is soft, nontender, no organomegaly  8. Skin:  No rashes noted      Data Review:    CBC Recent Labs  Lab 01/25/21 1208  WBC 11.6*  HGB 12.4  HCT 39.1  PLT 225  MCV 86.3  MCH 27.4  MCHC 31.7  RDW 14.0   ------------------------------------------------------------------------------------------------------------------  Results for orders placed or performed during the hospital encounter of 01/25/21 (from the past 48 hour(s))  CBC     Status: Abnormal   Collection Time: 01/25/21 12:08 PM  Result Value Ref Range   WBC 11.6 (H) 4.0 - 10.5 K/uL   RBC 4.53 3.87 - 5.11 MIL/uL   Hemoglobin 12.4 12.0 - 15.0 g/dL   HCT 39.1 36.0 -  46.0 %   MCV 86.3 80.0 - 100.0 fL   MCH 27.4 26.0 - 34.0 pg   MCHC 31.7 30.0 - 36.0 g/dL   RDW 14.0 11.5 - 15.5 %   Platelets 225 150 - 400 K/uL   nRBC 0.0 0.0 - 0.2 %    Comment: Performed at [facility], [place] [address], [place], [place] [address]  Comprehensive metabolic panel     Status: Abnormal   Collection Time: 01/25/21 12:08 PM  Result Value Ref Range   Sodium 140 135 - 145 mmol/L   Potassium 4.3 3.5 - 5.1 mmol/L   Chloride 106 98 - 111 mmol/L   CO2 26 22 - 32 mmol/L   Glucose, Bld 183 (H) 70 - 99 mg/dL    Comment: Glucose reference range applies only to samples taken after fasting for at  least 8 hours.   BUN 11 8 - 23 mg/dL   Creatinine, Ser 0.56 0.44 - 1.00 mg/dL   Calcium 9.4 8.9 - 10.3 mg/dL   Total Protein 7.2 6.5 - 8.1 g/dL   Albumin 3.8 3.5 - 5.0 g/dL   AST 16 15 - 41 U/L   ALT 8 0 - 44 U/L   Alkaline Phosphatase 72 38 - 126 U/L   Total Bilirubin 0.5 0.3 - 1.2 mg/dL   GFR, Estimated >60 >60 mL/min    Comment: (NOTE) Calculated using the CKD-EPI Creatinine Equation (2021)    Anion gap 8 5 - 15    Comment: Performed at [facility], [place] [address], [place], [place] [address]  Ethanol     Status: None   Collection Time: 01/25/21 12:08 PM  Result Value Ref Range   Alcohol, Ethyl (B) <10 <10 mg/dL    Comment: (NOTE) Lowest detectable limit for serum alcohol is 10 mg/dL.  For medical purposes only. Performed at [facility], [place] [address] [place], [place] [address]   Hemoglobin A1c     Status: Abnormal   Collection Time: 01/25/21 12:08 PM  Result Value Ref Range   Hgb A1c MFr Bld 6.3 (H) 4.8 - 5.6 %    Comment: (NOTE) Pre diabetes:          5.7%-6.4%  Diabetes:              >6.4%  Glycemic control for   <7.0% adults with diabetes    Mean Plasma Glucose 134.11 mg/dL    Comment: Performed at [facility], [place] [address], [place], [place] [address]  POC CBG, ED     Status: Abnormal   Collection Time: 01/25/21 12:25 PM  Result Value Ref Range   Glucose-Capillary 192 (H) 70 - 99 mg/dL    Comment: Glucose reference range applies only to samples taken after fasting for at least 8 hours.  Urine rapid drug screen (hosp performed)not at [facility]     Status: None   Collection Time: 01/25/21 12:28 PM  Result Value Ref Range   Opiates NONE DETECTED NONE DETECTED   Cocaine NONE DETECTED NONE DETECTED   Benzodiazepines NONE DETECTED NONE DETECTED   Amphetamines NONE DETECTED NONE DETECTED   Tetrahydrocannabinol NONE DETECTED NONE DETECTED   Barbiturates NONE DETECTED NONE DETECTED    Comment: (NOTE) DRUG SCREEN FOR  MEDICAL PURPOSES ONLY.  IF CONFIRMATION IS NEEDED FOR ANY PURPOSE, NOTIFY LAB WITHIN 5 DAYS.  LOWEST DETECTABLE LIMITS FOR URINE DRUG SCREEN Drug Class  Cutoff (ng/mL) Amphetamine and metabolites    1000 Barbiturate and metabolites    200 Benzodiazepine                 179 Tricyclics and metabolites     300 Opiates and metabolites        300 Cocaine and metabolites        300 THC                            50 Performed at [facility], [place] [address] [place], [place] [address]   Urinalysis, Routine w reflex microscopic Urine, Clean Catch     Status: Abnormal   Collection Time: 01/25/21 12:28 PM  Result Value Ref Range   Color, Urine YELLOW (A) YELLOW   APPearance CLEAR (A) CLEAR   Specific Gravity, Urine 1.020 1.005 - 1.030   pH 6.0 5.0 - 8.0   Glucose, UA NEGATIVE NEGATIVE mg/dL   Hgb urine dipstick TRACE (A) NEGATIVE   Bilirubin Urine NEGATIVE NEGATIVE   Ketones, ur NEGATIVE NEGATIVE mg/dL   Protein, ur NEGATIVE NEGATIVE mg/dL   Nitrite NEGATIVE NEGATIVE   Leukocytes,Ua NEGATIVE NEGATIVE   RBC / HPF 0-5 0 - 5 RBC/hpf   WBC, UA 0-5 0 - 5 WBC/hpf   Bacteria, UA RARE (A) NONE SEEN   Mucus PRESENT    Hyaline Casts, UA PRESENT     Comment: Performed at [facility], [place] [address], [place], [place] [address]  POC CBG, ED     Status: Abnormal   Collection Time: 01/25/21  4:25 PM  Result Value Ref Range   Glucose-Capillary 139 (H) 70 - 99 mg/dL    Comment: Glucose reference range applies only to samples taken after fasting for at least 8 hours.  Protime-INR     Status: None   Collection Time: 01/25/21  4:53 PM  Result Value Ref Range   Prothrombin Time 13.2 11.4 - 15.2 seconds   INR 1.0 0.8 - 1.2    Comment: (NOTE) INR goal varies based on device and disease states. Performed at [facility], [place] [address] [place], [place] [address]   APTT     Status: None   Collection Time: 01/25/21  4:53  PM  Result Value Ref Range   aPTT 29 24 - 36 seconds    Comment: Performed at [facility], [place] [address], [place], [place] [address]  Resp Panel by RT-PCR (Flu A&B, Covid) Nasopharyngeal Swab     Status: None   Collection Time: 01/25/21  4:54 PM   Specimen: Nasopharyngeal Swab; Nasopharyngeal(NP) swabs in vial transport medium  Result Value Ref Range   SARS Coronavirus 2 by RT PCR NEGATIVE NEGATIVE    Comment: (NOTE) SARS-CoV-2 target nucleic acids are NOT DETECTED.  The SARS-CoV-2 RNA is generally detectable in upper respiratory specimens during the acute phase of infection. The lowest concentration of SARS-CoV-2 viral copies this assay can detect is 138 copies/mL. A negative result does not preclude SARS-Cov-2 infection and should not be used as the sole basis for treatment or other patient management decisions. A negative result may occur with  improper specimen collection/handling, submission of specimen other than nasopharyngeal swab, presence of viral mutation(s) within the areas targeted by this assay, and inadequate number of viral copies(<138 copies/mL). A negative result must be combined with clinical observations, patient history, and epidemiological information. The expected result is Negative.  Fact  Sheet for Patients:  EntrepreneurPulse.com.au  Fact Sheet for Healthcare Providers:  IncredibleEmployment.be  This test is no t yet approved or cleared by the [place] FDA and  has been authorized for detection and/or diagnosis of SARS-CoV-2 by FDA under an Emergency Use Authorization (EUA). This EUA will remain  in effect (meaning this test can be used) for the duration of the COVID-19 declaration under Section 564(b)(1) of the Act, 21 U.S.C.section 360bbb-3(b)(1), unless the authorization is terminated  or revoked sooner.       Influenza A by PCR NEGATIVE NEGATIVE   Influenza B by PCR NEGATIVE NEGATIVE     Comment: (NOTE) The Xpert Xpress SARS-CoV-2/FLU/RSV plus assay is intended as an aid in the diagnosis of influenza from Nasopharyngeal swab specimens and should not be used as a sole basis for treatment. Nasal washings and aspirates are unacceptable for Xpert Xpress SARS-CoV-2/FLU/RSV testing.  Fact Sheet for Patients: EntrepreneurPulse.com.au  Fact Sheet for Healthcare Providers: IncredibleEmployment.be  This test is not yet approved or cleared by the [place] FDA and has been authorized for detection and/or diagnosis of SARS-CoV-2 by FDA under an Emergency Use Authorization (EUA). This EUA will remain in effect (meaning this test can be used) for the duration of the COVID-19 declaration under Section 564(b)(1) of the Act, 21 U.S.C. section 360bbb-3(b)(1), unless the authorization is terminated or revoked.  Performed at [facility], [place] [address], [place], [place] [address]     Chemistries  Recent Labs  Lab 01/25/21 1208  NA 140  K 4.3  CL 106  CO2 26  GLUCOSE 183*  BUN 11  CREATININE 0.56  CALCIUM 9.4  AST 16  ALT 8  ALKPHOS 72  BILITOT 0.5   ------------------------------------------------------------------------------------------------------------------  ------------------------------------------------------------------------------------------------------------------ GFR: Estimated Creatinine Clearance: 67.5 mL/min (by C-G formula based on SCr of 0.56 mg/dL). Liver Function Tests: Recent Labs  Lab 01/25/21 1208  AST 16  ALT 8  ALKPHOS 72  BILITOT 0.5  PROT 7.2  ALBUMIN 3.8   No results for input(s): LIPASE, AMYLASE in the last 168 hours. No results for input(s): AMMONIA in the last 168 hours. Coagulation Profile: Recent Labs  Lab 01/25/21 1653  INR 1.0   Cardiac Enzymes: No results for input(s): CKTOTAL, CKMB, CKMBINDEX, TROPONINI in the last 168 hours. BNP (last 3 results) No  results for input(s): PROBNP in the last 8760 hours. HbA1C: Recent Labs    01/25/21 1208  HGBA1C 6.3*   CBG: Recent Labs  Lab 01/25/21 1225 01/25/21 1625  GLUCAP 192* 139*   Lipid Profile: No results for input(s): CHOL, HDL, LDLCALC, TRIG, CHOLHDL, LDLDIRECT in the last 72 hours. Thyroid Function Tests: No results for input(s): TSH, T4TOTAL, FREET4, T3FREE, THYROIDAB in the last 72 hours. Anemia Panel: No results for input(s): VITAMINB12, FOLATE, FERRITIN, TIBC, IRON, RETICCTPCT in the last 72 hours.  --------------------------------------------------------------------------------------------------------------- Urine analysis:    Component Value Date/Time   COLORURINE YELLOW (A) 01/25/2021 1228   APPEARANCEUR CLEAR (A) 01/25/2021 1228   LABSPEC 1.020 01/25/2021 1228   PHURINE 6.0 01/25/2021 1228   GLUCOSEU NEGATIVE 01/25/2021 1228   GLUCOSEU NEGATIVE 04/19/2019 1208   HGBUR TRACE (A) 01/25/2021 1228   BILIRUBINUR NEGATIVE 01/25/2021 1228   KETONESUR NEGATIVE 01/25/2021 1228   PROTEINUR NEGATIVE 01/25/2021 1228   UROBILINOGEN 0.2 04/19/2019 1208   NITRITE NEGATIVE 01/25/2021 1228   LEUKOCYTESUR NEGATIVE 01/25/2021 1228      Imaging Results:    CT HEAD WO CONTRAST (5MM)  Result Date: 01/25/2021 CLINICAL DATA:  Headache and memory issues. EXAM: CT HEAD WITHOUT CONTRAST TECHNIQUE: Contiguous axial images were obtained from the base of the skull through the vertex without intravenous contrast. COMPARISON:  None. FINDINGS: Brain: No evidence of acute infarction, hemorrhage, hydrocephalus, extra-axial collection or mass lesion/mass effect. Mild generalized cerebral atrophy. Scattered mild periventricular and subcortical white matter hypodensities are nonspecific, but favored to reflect chronic microvascular ischemic changes. Vascular: Atherosclerotic vascular calcification of the carotid siphons. No hyperdense vessel. Skull: Normal. Negative for fracture or focal lesion.  Sinuses/Orbits: No acute finding. Other: None. IMPRESSION: 1. No acute intracranial abnormality. 2. Mild cerebral atrophy and chronic microvascular ischemic changes. Electronically Signed   By: Titus Dubin M.D.   On: 01/25/2021 13:20   MR ANGIO HEAD WO CONTRAST  Result Date: 01/25/2021 CLINICAL DATA:  Headache, chronic, no new features EXAM: MRA HEAD WITHOUT CONTRAST TECHNIQUE: Angiographic images of the Circle of Willis were acquired using MRA technique without intravenous contrast. COMPARISON:  No pertinent prior exam. FINDINGS: Moderately motion limited study. Anterior circulation: Bilateral intracranial ICAs, MCAs are patent without high-grade proximal stenosis. The right ACA is difficult to visualize (particularly distally); however, this could be related in part to congenitally small ACA given suspected partially azygous ACA with prominent left A1 ACA. Superimposed stenosis is not excluded. The left ACA is patent without proximal high-grade stenosis. Limited evaluation of distal vessel due to motion. Posterior circulation: Motion limited evaluation of the proximal intradural vertebral arteries bilaterally. The left vertebral artery is small/non dominant and poorly assessed. The right vertebral artery is patent. The basilar artery and proximal posterior cerebral arteries are patent without high-grade stenosis. Right-sided posterior communicating artery with small or absent right P1 PCA, anatomic variant. Limited evaluation of distal PCAs due to motion. Anatomic variants: See above. IMPRESSION: 1. Moderate patient motion, limiting evaluation for stenosis or aneurysm. 2. The right ACA is difficult to visualize (particularly distally); however, this could be related in part to congenitally small ACA given suspected partially azygous ACA and prominent left A1 ACA. A right ACA stenosis is not excluded. 3. Otherwise, no large vessel occlusion or evidence of proximal high-grade stenosis. Electronically Signed    By: Margaretha Sheffield M.D.   On: 01/25/2021 15:07   MR BRAIN WO CONTRAST  Result Date: 01/25/2021 CLINICAL DATA:  Transient ischemic attack (TIA) EXAM: MRI HEAD WITHOUT CONTRAST TECHNIQUE: Multiplanar, multiecho pulse sequences of the brain and surrounding structures were obtained without intravenous contrast. COMPARISON:  Same day head CT. FINDINGS: Mildly motion limited exam.  Within this limitation: Brain: No acute infarction, hemorrhage, hydrocephalus, extra-axial collection or mass lesion. Mild-to-moderate scattered T2 hyperintensities within the white matter, nonspecific but compatible with chronic microvascular ischemic disease. Mild for age atrophy with ex vacuo ventricular dilation. Vascular: Further evaluated on concurrent MRA. Skull and upper cervical spine: Normal marrow signal. Sinuses/Orbits: Mild paranasal sinus mucosal thickening. No acute orbital findings. Other: No mastoid effusions. IMPRESSION: 1. No evidence of acute intracranial abnormality. 2. Mild-to-moderate chronic microvascular ischemic disease and mild atrophy. Electronically Signed   By: Margaretha Sheffield M.D.   On: 01/25/2021 14:51   CT ANGIO HEAD NECK W WO CM W PERF (CODE STROKE)  Result Date: 01/25/2021 CLINICAL DATA:  Neuro deficit, acute, stroke suspected EXAM: CT ANGIOGRAPHY HEAD AND NECK TECHNIQUE: Multidetector CT imaging of the head and neck was performed using the standard protocol during bolus administration of intravenous contrast. Multiplanar CT image reconstructions and MIPs were obtained to evaluate the vascular anatomy. Carotid stenosis measurements (when applicable) are obtained utilizing  NASCET criteria, using the distal internal carotid diameter as the denominator. Multiphase CT imaging of the brain was performed following IV bolus contrast injection. Subsequent parametric perfusion maps were calculated using RAPID software. CONTRAST:  137m OMNIPAQUE IOHEXOL 350 MG/ML SOLN COMPARISON:  Same day MRI/MRA.  FINDINGS: CT HEAD FINDINGS Brain: No evidence of acute large vascular territory infarction, hemorrhage, hydrocephalus, extra-axial collection or mass lesion/mass effect. Mild-to-moderate patchy white matter hypoattenuation, nonspecific but compatible with chronic microvascular ischemic disease. Mild atrophy. Vascular: See below. Skull: No acute fracture. Sinuses: Mild paranasal sinus mucosal thickening. Orbits: No acute finding. Review of the MIP images confirms the above findings CTA NECK FINDINGS Aortic arch: Calcific and noncalcific atherosclerosis of the aorta. Great vessel origins are patent. Right carotid system: Motion limited evaluation. The common carotid artery and internal carotid artery are patent without evidence of significant (greater than 50%) stenosis. Mild atherosclerosis at the carotid bifurcation. Left carotid system: Motion limited evaluation. Nondiagnostic evaluation of portions of the common carotid artery due to severe motion. Otherwise, the visible portions of the left common carotid artery and internal carotid artery are patent. There is moderate calcific atherosclerosis at the carotid bifurcation with proximally 40% stenosis. Vertebral arteries: Motion limited evaluation of the vertebral artery origins with potentially moderate right vertebral artery origin stenosis. The left vertebral artery is small throughout its course. Skeleton: Multilevel degenerative change of the cervical spine with likely at least moderate canal and bilateral foraminal stenosis C4-C5. Other neck: Heterogeneous enlarged thyroid gland with thyroid nodules that are poorly evaluated due to motion. The thyroid is better evaluated on ultrasound from Oct 13, 2018. Upper chest: Emphysema.  Visualized lung apices are clear. Review of the MIP images confirms the above findings CTA HEAD FINDINGS Anterior circulation: Motion limited evaluation of the intracranial ICAs. Calcific atherosclerosis of the intracranial ICAs with  likely mild narrowing bilaterally. Bilateral M1 MCAs and proximal M2 MCAs are patent without high-grade stenosis. The right ACA is smal (similar to recent MRA) with prominent left ACA and likely partly azygous and potentially congenital. Posterior circulation: Small left intradural vertebral artery, which appears to largely terminate as PICA. Mild atherosclerotic narrowing of the right proximal intradural vertebral artery. The basilar artery and bilateral posterior cerebral arteries are patent without proximal hemodynamically significant stenosis. Right posterior communicating artery with small right P1 PCA, anatomic variant. Venous sinuses: As permitted by contrast timing, patent. Small left transverse sinus. Anatomic variants: As detailed above. Review of the MIP images confirms the above findings CT Brain Perfusion Findings: ASPECTS: 10 CBF (<30%) Volume: 042mPerfusion (Tmax>6.0s) volume: 2043mismatch Volume: 20ML. Located in the anterior left temporal lobe and anterior/inferior right frontal lobe. Infarction Location:None. IMPRESSION: CT head: 1. No evidence of acute large vascular territory infarct or acute hemorrhage. ASPECTS 10. 2. Mild-to-moderate chronic microvascular ischemic disease. CTA head: 1. No large vessel occlusion or proximal hemodynamically significant stenosis. 2. 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 CTA Neck: 1. Motion limited evaluation with nondiagnostic evaluation in areas, as detailed above. 2. Bilateral carotid bifurcation atherosclerosis with approximately 40% stenosis of the left ICA origin. 3. Motion limited evaluation of the vertebral artery origins with potentially moderate right vertebral artery origin stenosis. The left vertebral artery is small throughout its course. 4. Multilevel degenerative change of the cervical spine with likely at least moderate canal and bilateral foraminal stenosis C4-C5.  An MRI of the cervical spine could further characterize the canal and  foramina if clinically indicated. 5. Heterogeneous enlarged thyroid gland with thyroid nodules that are poorly evaluated due to motion. The thyroid is better evaluated on ultrasound from Oct 13, 2018. 6. Aortic Atherosclerosis (ICD10-I70.0) and Emphysema (ICD10-J43.9). CT Perfusion: 1. RAPID detects approximally 20 mL of mismatch in the left temporal lobe and inferior right frontal lobe. This finding is indeterminate, but may be in part artifactual given involvement of two different vascular territories and given mismatches in areas that are prone to artifact (inferior/anterior temporal and frontal regions). A repeat MRI could further evaluate if clinically indicated. 2. No evidence of core infarct. Code stroke imaging results were communicated on 01/25/2021 at 4:30 pm to provider Dr. Maurine Minister Via telephone, who verbally acknowledged these results. Electronically Signed   By: Margaretha Sheffield M.D.   On: 01/25/2021 16:56    My personal review of EKG: Rhythm NSR, nonspecific ST-T changes   Assessment & Plan:    Active Problems:   Seizure ([place])   Word finding difficulty-concern for seizure-patient has been seen by telemetry neurology, plan for stat EEG and LTM EEG overnight to rule out seizure.  We will start Versed 2 mg IV for generalized tonic-clonic seizure acute lasting more than 5 minutes given Ativan shortages as per neurology recommendation.  Neurologist was consulted see patient tonight. Hypertension-blood pressure is mildly limited, will restart home medications including amlodipine, Carvedilol,, Aldactone.  Also start hydralazine 25 mg p.o. every 6 hours as needed for BP greater than 160/100. COPD-patient is chronically on home O2, 2 L/min.  She is currently not in exacerbation significantly has not albuterol.  Continue Daliresp. Diabetes mellitus type 2-we will hold Janumet XR, and start sliding scale insulin with NovoLog.   Check CBG every 6 hours. History of breast cancer-diagnosed in January 2019 on routine screening mammogram, biopsy confirmed invasive mammary carcinoma, estrogen receptors and progesterone receptor positive, underwent left lumpectomy.  Currently maintained on anastrozole.  We will continue with anastrozole in the hospital.    DVT Prophylaxis-   Lovenox   AM Labs Ordered, also please review Full Orders  Family Communication: Admission, patients condition and plan of care including tests being ordered have been discussed with the patient and who indicate understanding and agree with the plan and Code Status.  Code Status: Full code  Admission status: Inpatient :The appropriate admission status for this patient is INPATIENT. Inpatient status is judged to be reasonable and necessary in order to provide the required intensity of service to ensure the patient's safety. The patient's presenting symptoms, physical exam findings, and initial radiographic and laboratory data in the context of their chronic comorbidities is felt to place them at high risk for further clinical deterioration. Furthermore, it is not anticipated that the patient will be medically stable for discharge from the hospital within 2 midnights of admission. The following factors support the admission status of inpatient.     The patient's presenting symptoms include word finding difficulty. The worrisome physical exam findings include concern for seizure. The initial radiographic and laboratory data are worrisome because of concern for seizure. The chronic co-morbidities include diabetes mellitus type 2.       * I certify that at the point of admission it is my clinical judgment that the patient will require inpatient hospital care spanning beyond 2 midnights from the point of admission due to high intensity of service, high risk for further deterioration and high frequency of surveillance required.*  Time spent in minutes : 60  minutes   Aquarius Latouche S  Mahiya Kercheval M.D

## 2021-01-25 NOTE — ED Provider Notes (Signed)
  Patient is a 75 year old woman who arrives in transfer from [facility] for neurology evaluation and EEG.  She was seen through telemetry neurology earlier today after being called a code stroke.  She is transferred for EEG.    I called Dr. Earnestine Leys to inform him of the patient's arrival in the emergency room at 1813, he requested that I place order for adult EEG, and he will call the tech.   Based on chart review it appears that plan is if patient is actively having seizures consideration for admission to ICU, otherwise admit to hospitalist.  I spoke with patient.  She initially seems to be having fluent speech however she is substituting inappropriate words for the context.  She is aware her speech is abnormal.    No obvious twitching or jerking motions. Seizure precautions ordered.     I spoke with Dr. Rory Percy who recommended medical admission.    I spoke with Dr. Darrick Meigs who will see patient for admission.   Note: Portions of this report may have been transcribed using voice recognition software. Every effort was made to ensure accuracy; however, inadvertent computerized transcription errors may be present      Per consult note by Dr. Curly Shores written today and signed at 528pm   "Assessment: Unclear etiology of this patient's aphasia though given imaging findings to date suspect she may be having focal seizures.  Given her age and comorbidities there is a high risk of secondary generalization especially given her speech difficulties have been ongoing all day suggestive of potential focal status.  We will emergently transfer to [facility] ED for EEG hookup with MRI compatible leads in case repeat MRI with contrast might be helpful.    Recommendations:  -Emergent transfer to [facility], ED -Dr. Cheral Marker of neurology at [facility] aware, as well as Dr. Darl Householder -Stat EEG with high likelihood of conversion to long-term EEG monitoring -- will need non-hallway bed for hookup -If needed, 2 mg IV  Versed for generalized tonic-clonic seizure activity greater than 5 minutes given Ativan shortages -Seizure precautions -Further recommendations per [facility] neurology evaluation"           Ollen Gross 01/26/21 0005    Isla Pence, MD 01/28/21 [phone]

## 2021-01-26 DIAGNOSIS — R479 Unspecified speech disturbances: Secondary | ICD-10-CM | POA: Diagnosis not present

## 2021-01-26 DIAGNOSIS — R569 Unspecified convulsions: Secondary | ICD-10-CM | POA: Diagnosis not present

## 2021-01-26 DIAGNOSIS — R4789 Other speech disturbances: Secondary | ICD-10-CM | POA: Diagnosis not present

## 2021-01-26 DIAGNOSIS — R410 Disorientation, unspecified: Secondary | ICD-10-CM | POA: Diagnosis not present

## 2021-01-26 LAB — CBC
HCT: 37.6 % (ref 36.0–46.0)
Hemoglobin: 12.3 g/dL (ref 12.0–15.0)
MCH: 28.1 pg (ref 26.0–34.0)
MCHC: 32.7 g/dL (ref 30.0–36.0)
MCV: 86 fL (ref 80.0–100.0)
Platelets: 193 10*3/uL (ref 150–400)
RBC: 4.37 MIL/uL (ref 3.87–5.11)
RDW: 13.8 % (ref 11.5–15.5)
WBC: 9.7 10*3/uL (ref 4.0–10.5)
nRBC: 0 % (ref 0.0–0.2)

## 2021-01-26 LAB — COMPREHENSIVE METABOLIC PANEL
ALT: 8 U/L (ref 0–44)
AST: 13 U/L — ABNORMAL LOW (ref 15–41)
Albumin: 3 g/dL — ABNORMAL LOW (ref 3.5–5.0)
Alkaline Phosphatase: 61 U/L (ref 38–126)
Anion gap: 8 (ref 5–15)
BUN: 7 mg/dL — ABNORMAL LOW (ref 8–23)
CO2: 27 mmol/L (ref 22–32)
Calcium: 8.9 mg/dL (ref 8.9–10.3)
Chloride: 102 mmol/L (ref 98–111)
Creatinine, Ser: 0.65 mg/dL (ref 0.44–1.00)
GFR, Estimated: >60 mL/min (ref >60)
Glucose, Bld: 178 mg/dL — ABNORMAL HIGH (ref 70–99)
Potassium: 3.7 mmol/L (ref 3.5–5.1)
Sodium: 137 mmol/L (ref 135–145)
Total Bilirubin: 0.6 mg/dL (ref 0.3–1.2)
Total Protein: 6.2 g/dL — ABNORMAL LOW (ref 6.5–8.1)

## 2021-01-26 LAB — CBG MONITORING, ED
Glucose-Capillary: 100 mg/dL — ABNORMAL HIGH (ref 70–99)
Glucose-Capillary: 112 mg/dL — ABNORMAL HIGH (ref 70–99)
Glucose-Capillary: 218 mg/dL — ABNORMAL HIGH (ref 70–99)

## 2021-01-26 MED ORDER — METHOCARBAMOL 500 MG PO TABS
500.0000 mg | ORAL_TABLET | Freq: Three times a day (TID) | ORAL | Status: DC | PRN
  Administered 2021-01-26: 500 mg via ORAL
  Filled 2021-01-26: qty 1

## 2021-01-26 MED ORDER — ROSUVASTATIN CALCIUM 20 MG PO TABS
40.0000 mg | ORAL_TABLET | Freq: Every day | ORAL | Status: DC
  Administered 2021-01-26 – 2021-01-27 (×2): 40 mg via ORAL
  Filled 2021-01-26 (×2): qty 2

## 2021-01-26 MED ORDER — KETOROLAC TROMETHAMINE 15 MG/ML IJ SOLN
15.0000 mg | Freq: Once | INTRAMUSCULAR | Status: AC
  Administered 2021-01-26: 15 mg via INTRAVENOUS
  Filled 2021-01-26: qty 1

## 2021-01-26 MED ORDER — INSULIN ASPART 100 UNIT/ML IJ SOLN: 0.0000 [IU] | Freq: Every day | INTRAMUSCULAR | Status: DC

## 2021-01-26 MED ORDER — ASPIRIN EC 81 MG PO TBEC
81.0000 mg | DELAYED_RELEASE_TABLET | Freq: Every day | ORAL | Status: DC
  Administered 2021-01-26 – 2021-01-27 (×2): 81 mg via ORAL
  Filled 2021-01-26 (×2): qty 1

## 2021-01-26 MED ORDER — ONDANSETRON HCL 4 MG/2ML IJ SOLN: 4.0000 mg | Freq: Four times a day (QID) | INTRAMUSCULAR | Status: DC | PRN

## 2021-01-26 MED ORDER — ONDANSETRON HCL 4 MG PO TABS
4.0000 mg | ORAL_TABLET | Freq: Four times a day (QID) | ORAL | Status: DC | PRN
  Administered 2021-01-26: 4 mg via ORAL
  Filled 2021-01-26: qty 1

## 2021-01-26 MED ORDER — INSULIN ASPART 100 UNIT/ML IJ SOLN
0.0000 [IU] | Freq: Three times a day (TID) | INTRAMUSCULAR | Status: DC
  Administered 2021-01-26: 5 [IU] via SUBCUTANEOUS

## 2021-01-26 MED ORDER — SPIRONOLACTONE 25 MG PO TABS
25.0000 mg | ORAL_TABLET | Freq: Every day | ORAL | Status: DC
  Administered 2021-01-26 – 2021-01-27 (×2): 25 mg via ORAL
  Filled 2021-01-26 (×2): qty 1

## 2021-01-26 MED ORDER — ACETAMINOPHEN 500 MG PO TABS
1000.0000 mg | ORAL_TABLET | Freq: Four times a day (QID) | ORAL | Status: DC | PRN
  Administered 2021-01-26: 1000 mg via ORAL
  Filled 2021-01-26: qty 2

## 2021-01-26 MED ORDER — AMLODIPINE BESYLATE 5 MG PO TABS
10.0000 mg | ORAL_TABLET | Freq: Every day | ORAL | Status: DC
  Administered 2021-01-26 – 2021-01-27 (×2): 10 mg via ORAL
  Filled 2021-01-26 (×2): qty 2

## 2021-01-26 MED ORDER — ROFLUMILAST 500 MCG PO TABS
500.0000 ug | ORAL_TABLET | Freq: Every day | ORAL | Status: DC
  Administered 2021-01-26 – 2021-01-27 (×2): 500 ug via ORAL
  Filled 2021-01-26 (×2): qty 1

## 2021-01-26 MED ORDER — SODIUM CHLORIDE 0.9 % IV SOLN: INTRAVENOUS | Status: DC

## 2021-01-26 MED ORDER — ANASTROZOLE 1 MG PO TABS
1.0000 mg | ORAL_TABLET | Freq: Every day | ORAL | Status: DC
  Administered 2021-01-26 – 2021-01-27 (×2): 1 mg via ORAL
  Filled 2021-01-26 (×2): qty 1

## 2021-01-26 MED ORDER — ALBUTEROL SULFATE (2.5 MG/3ML) 0.083% IN NEBU: 2.5000 mg | INHALATION_SOLUTION | Freq: Four times a day (QID) | RESPIRATORY_TRACT | Status: DC | PRN

## 2021-01-26 MED ORDER — HYDRALAZINE HCL 25 MG PO TABS
25.0000 mg | ORAL_TABLET | Freq: Four times a day (QID) | ORAL | Status: DC | PRN
  Administered 2021-01-26: 25 mg via ORAL
  Filled 2021-01-26: qty 1

## 2021-01-26 MED ORDER — THIAMINE HCL 100 MG PO TABS
100.0000 mg | ORAL_TABLET | ORAL | Status: DC
  Administered 2021-01-26: 100 mg via ORAL
  Filled 2021-01-26 (×2): qty 1

## 2021-01-26 MED ORDER — INSULIN ASPART 100 UNIT/ML IJ SOLN: 0.0000 [IU] | Freq: Three times a day (TID) | INTRAMUSCULAR | Status: DC

## 2021-01-26 MED ORDER — CARVEDILOL 3.125 MG PO TABS
6.2500 mg | ORAL_TABLET | Freq: Two times a day (BID) | ORAL | Status: DC
  Administered 2021-01-26 – 2021-01-27 (×3): 6.25 mg via ORAL
  Filled 2021-01-26 (×3): qty 2

## 2021-01-26 MED ORDER — ENOXAPARIN SODIUM 40 MG/0.4ML IJ SOSY
40.0000 mg | PREFILLED_SYRINGE | Freq: Every day | INTRAMUSCULAR | Status: DC
  Administered 2021-01-26 – 2021-01-27 (×2): 40 mg via SUBCUTANEOUS
  Filled 2021-01-26 (×2): qty 0.4

## 2021-01-26 NOTE — Plan of Care (Signed)
Neurology Plan of Care Note:   Patient and her niece feel her speech is back to normal. No seizures were seen on LTM. There has been no etiology found for this event.  No AEDs are warranted at this time. MRI brain without acute abnormality and MRA head/neck without LVO. This could have been hypertensive event as cause for speech difficulties, a seizure, or a TIA. Imaging/EEG negative. She is on ASA, Statin, and her BP is controlled at home. No further neurological workup is required at this time. Plan discussed with and approved by H. Theda Sers, [facility] hospitalist attending with plan.   Clance Boll, NP Neurology

## 2021-01-26 NOTE — ED Notes (Addendum)
Pt c/o headache and nausea. Tylenol and zofran given. Neuro intact. Dr. Wyline Copas notified.

## 2021-01-26 NOTE — Procedures (Addendum)
Patient Name: Victoria [name]  MRN: [id number]  Epilepsy Attending: Lora Havens  Referring Physician/Provider: Dr Amie Portland Duration: 01/25/2021 2224 to 01/26/2021 1433   Patient history: 75 year old female with speech disturbance.  EEG to look for seizures.   Level of alertness: Awake,asleep   AEDs during EEG study: None   Technical aspects: This EEG study was done with scalp electrodes positioned according to the 10-20 International system of electrode placement. Electrical activity was acquired at a sampling rate of '500Hz'$  and reviewed with a high frequency filter of '70Hz'$  and a low frequency filter of '1Hz'$ . EEG data were recorded continuously and digitally stored.    Description: The posterior dominant rhythm consists of 8-9 Hz activity of moderate voltage (25-35 uV) seen predominantly in posterior head regions, symmetric and reactive to eye opening and eye closing. Drowsiness was characterized by attenuation of the posterior background rhythm. Sleep was characterized by vertex waves, sleep spindles (12 to 14 Hz), maximal frontocentral region. Hyperventilation and photic stimulation were not performed.      IMPRESSION: This study is within normal limits. No seizures or epileptiform discharges were seen throughout the recording.   Victoria [name] [name]

## 2021-01-26 NOTE — Progress Notes (Signed)
LTM maintenance completed; no skin breakdown under frontal leads. Tested event button.

## 2021-01-26 NOTE — Procedures (Addendum)
Patient Name: Victoria [name]  MRN: [id number]  Epilepsy Attending: Lora Havens  Referring Physician/Provider: Ollen Gross Date: 01/25/2021 Duration: 24.12 mins  Patient history: 75 year old female with speech disturbance.  EEG to look for seizures.  Level of alertness: Awake,asleep  AEDs during EEG study: None  Technical aspects: This EEG study was done with scalp electrodes positioned according to the 10-20 International system of electrode placement. Electrical activity was acquired at a sampling rate of '500Hz'$  and reviewed with a high frequency filter of '70Hz'$  and a low frequency filter of '1Hz'$ . EEG data were recorded continuously and digitally stored.   Description: The posterior dominant rhythm consists of 8-9 Hz activity of moderate voltage (25-35 uV) seen predominantly in posterior head regions, symmetric and reactive to eye opening and eye closing. Sleep was characterized by vertex waves, sleep spindles (12 to 14 Hz), maximal frontocentral region. Hyperventilation and photic stimulation were not performed.     IMPRESSION: This study is within normal limits. No seizures or epileptiform discharges were seen throughout the recording.  Laythan Hayter [name]

## 2021-01-26 NOTE — ED Notes (Signed)
Dr. Wyline Copas at bedside. Pt still nauseated. Given ginger ale with meds.

## 2021-01-26 NOTE — Progress Notes (Signed)
PROGRESS NOTE    Victoria [name]  [id number] DOB: [birth year] DOA: 01/25/2021 PCP: Janith Lima, MD    Brief Narrative:  75 y.o. female, with history of breast cancer, COPD, hyperlipidemia, hypertension, obstructive sleep apnea came to ED with complaints of speech abnormality with word finding difficulty.  As per patient's niece patient was normal at 9:30 AM and then she was noted to have some garbled and confused speech.  She also had word finding difficulty.  She was brought to the ED for further evaluation.  She complained of frontal headache along with pain in her sinuses.   Assessment & Plan:   Active Problems:   Seizure ([place])   Word finding difficulty Initial concerns for seizure activity Pt underwent stat EEG and LTM EEG overnight that were noted by Neurology to be unremarkable Initial work up this AM was found to be unremarkable, patient returned to baseline state, and pt was eager to go home, initially cleared by Neurology for d/c Later in afternoon, patient had acute headache located over L cheek radiating to back of neck associated with nausea Pt re-evaluated at bedside with Neurology present. EEG was continued. Of note, palpable muscle spasm noted over posterior aspect of L neck Have ordered trial of robaxin Hypertension BP labile Continue home amlodipine, Carvedilol,, Aldactone.   Cont PRN hydralazine. COPD Noted to be on 2 L/min at baseline No wheezing, stable Diabetes mellitus type 2 Cont to hold Janumet XR while in hospital Cont on SSI coverage History of breast cancer diagnosed in January 2019 on routine screening mammogram, biopsy confirmed invasive mammary carcinoma, estrogen receptors and progesterone receptor positive, underwent left lumpectomy.   Currently maintained on anastrozole.  We will continue with anastrozole in the hospital.    DVT prophylaxis: Lovenox subq Code Status: Full Family Communication: Pt in room, family not at  bedside  Status is: Inpatient  Remains inpatient appropriate because:Inpatient level of care appropriate due to severity of illness  Dispo: The patient is from: Home              Anticipated d/c is to: Home              Patient currently is not medically stable to d/c.   Difficult to place patient No       Consultants:  Neurology  Procedures:  EEG  Antimicrobials: Anti-infectives (From admission, onward)    None       Subjective: Initially felt well this AM, hopeful to go home. Later in the afternoon, complained of acute marked L head pain starting from L cheek area radiating to the L neck. Also nauseated  Objective: Vitals:   01/26/21 1348 01/26/21 1400 01/26/21 1416 01/26/21 1500  BP: (!) 154/69 (!) 170/81 (!) 177/73 (!) 164/79  Pulse: 81 70  66  Resp: 16     Temp:      TempSrc:      SpO2: 98% 95%  99%  Weight:      Height:        Intake/Output Summary (Last 24 hours) at 01/26/2021 1717 Last data filed at 01/26/2021 0402 Gross per 24 hour  Intake --  Output 250 ml  Net -250 ml   Filed Weights   01/25/21 1649  Weight: 94.8 kg    Examination: General exam: Awake, laying in bed, in nad Respiratory system: Normal respiratory effort, no wheezing Cardiovascular system: regular rate, s1, s2 Gastrointestinal system: Soft, nondistended, positive BS Central nervous system: CN2-12 grossly intact, strength intact Extremities:  Perfused, no clubbing Skin: Normal skin turgor, no notable skin lesions seen Psychiatry: Mood normal // no visual hallucinations   Data Reviewed: I have personally reviewed following labs and imaging studies  CBC: Recent Labs  Lab 01/25/21 1208 01/26/21 0418  WBC 11.6* 9.7  HGB 12.4 12.3  HCT 39.1 37.6  MCV 86.3 86.0  PLT 225 0000000   Basic Metabolic Panel: Recent Labs  Lab 01/25/21 1208 01/26/21 0418  NA 140 137  K 4.3 3.7  CL 106 102  CO2 26 27  GLUCOSE 183* 178*  BUN 11 7*  CREATININE 0.56 0.65  CALCIUM 9.4 8.9    GFR: Estimated Creatinine Clearance: 67.5 mL/min (by C-G formula based on SCr of 0.65 mg/dL). Liver Function Tests: Recent Labs  Lab 01/25/21 1208 01/26/21 0418  AST 16 13*  ALT 8 8  ALKPHOS 72 61  BILITOT 0.5 0.6  PROT 7.2 6.2*  ALBUMIN 3.8 3.0*   No results for input(s): LIPASE, AMYLASE in the last 168 hours. No results for input(s): AMMONIA in the last 168 hours. Coagulation Profile: Recent Labs  Lab 01/25/21 1653  INR 1.0   Cardiac Enzymes: No results for input(s): CKTOTAL, CKMB, CKMBINDEX, TROPONINI in the last 168 hours. BNP (last 3 results) No results for input(s): PROBNP in the last 8760 hours. HbA1C: Recent Labs    01/25/21 1208  HGBA1C 6.3*   CBG: Recent Labs  Lab 01/25/21 1225 01/25/21 1625 01/26/21 0307 01/26/21 0702 01/26/21 1302  GLUCAP 192* 139* 100* 112* 218*   Lipid Profile: No results for input(s): CHOL, HDL, LDLCALC, TRIG, CHOLHDL, LDLDIRECT in the last 72 hours. Thyroid Function Tests: No results for input(s): TSH, T4TOTAL, FREET4, T3FREE, THYROIDAB in the last 72 hours. Anemia Panel: No results for input(s): VITAMINB12, FOLATE, FERRITIN, TIBC, IRON, RETICCTPCT in the last 72 hours. Sepsis Labs: No results for input(s): PROCALCITON, LATICACIDVEN in the last 168 hours.  Recent Results (from the past 240 hour(s))  Resp Panel by RT-PCR (Flu A&B, Covid) Nasopharyngeal Swab     Status: None   Collection Time: 01/25/21  4:54 PM   Specimen: Nasopharyngeal Swab; Nasopharyngeal(NP) swabs in vial transport medium  Result Value Ref Range Status   SARS Coronavirus 2 by RT PCR NEGATIVE NEGATIVE Final    Comment: (NOTE) SARS-CoV-2 target nucleic acids are NOT DETECTED.  The SARS-CoV-2 RNA is generally detectable in upper respiratory specimens during the acute phase of infection. The lowest concentration of SARS-CoV-2 viral copies this assay can detect is 138 copies/mL. A negative result does not preclude SARS-Cov-2 infection and should not be  used as the sole basis for treatment or other patient management decisions. A negative result may occur with  improper specimen collection/handling, submission of specimen other than nasopharyngeal swab, presence of viral mutation(s) within the areas targeted by this assay, and inadequate number of viral copies(<138 copies/mL). A negative result must be combined with clinical observations, patient history, and epidemiological information. The expected result is Negative.  Fact Sheet for Patients:  EntrepreneurPulse.com.au  Fact Sheet for Healthcare Providers:  IncredibleEmployment.be  This test is no t yet approved or cleared by the [place] FDA and  has been authorized for detection and/or diagnosis of SARS-CoV-2 by FDA under an Emergency Use Authorization (EUA). This EUA will remain  in effect (meaning this test can be used) for the duration of the COVID-19 declaration under Section 564(b)(1) of the Act, 21 U.S.C.section 360bbb-3(b)(1), unless the authorization is terminated  or revoked sooner.  Influenza A by PCR NEGATIVE NEGATIVE Final   Influenza B by PCR NEGATIVE NEGATIVE Final    Comment: (NOTE) The Xpert Xpress SARS-CoV-2/FLU/RSV plus assay is intended as an aid in the diagnosis of influenza from Nasopharyngeal swab specimens and should not be used as a sole basis for treatment. Nasal washings and aspirates are unacceptable for Xpert Xpress SARS-CoV-2/FLU/RSV testing.  Fact Sheet for Patients: EntrepreneurPulse.com.au  Fact Sheet for Healthcare Providers: IncredibleEmployment.be  This test is not yet approved or cleared by the [place] FDA and has been authorized for detection and/or diagnosis of SARS-CoV-2 by FDA under an Emergency Use Authorization (EUA). This EUA will remain in effect (meaning this test can be used) for the duration of the COVID-19 declaration under Section  564(b)(1) of the Act, 21 U.S.C. section 360bbb-3(b)(1), unless the authorization is terminated or revoked.  Performed at [facility], [place] [address], [place], [place] [address]      Radiology Studies: CT HEAD WO CONTRAST (5MM)  Result Date: 01/25/2021 CLINICAL DATA:  Headache and memory issues. EXAM: CT HEAD WITHOUT CONTRAST TECHNIQUE: Contiguous axial images were obtained from the base of the skull through the vertex without intravenous contrast. COMPARISON:  None. FINDINGS: Brain: No evidence of acute infarction, hemorrhage, hydrocephalus, extra-axial collection or mass lesion/mass effect. Mild generalized cerebral atrophy. Scattered mild periventricular and subcortical white matter hypodensities are nonspecific, but favored to reflect chronic microvascular ischemic changes. Vascular: Atherosclerotic vascular calcification of the carotid siphons. No hyperdense vessel. Skull: Normal. Negative for fracture or focal lesion. Sinuses/Orbits: No acute finding. Other: None. IMPRESSION: 1. No acute intracranial abnormality. 2. Mild cerebral atrophy and chronic microvascular ischemic changes. Electronically Signed   By: Titus Dubin M.D.   On: 01/25/2021 13:20   MR ANGIO HEAD WO CONTRAST  Result Date: 01/25/2021 CLINICAL DATA:  Headache, chronic, no new features EXAM: MRA HEAD WITHOUT CONTRAST TECHNIQUE: Angiographic images of the Circle of Willis were acquired using MRA technique without intravenous contrast. COMPARISON:  No pertinent prior exam. FINDINGS: Moderately motion limited study. Anterior circulation: Bilateral intracranial ICAs, MCAs are patent without high-grade proximal stenosis. The right ACA is difficult to visualize (particularly distally); however, this could be related in part to congenitally small ACA given suspected partially azygous ACA with prominent left A1 ACA. Superimposed stenosis is not excluded. The left ACA is patent without proximal high-grade stenosis.  Limited evaluation of distal vessel due to motion. Posterior circulation: Motion limited evaluation of the proximal intradural vertebral arteries bilaterally. The left vertebral artery is small/non dominant and poorly assessed. The right vertebral artery is patent. The basilar artery and proximal posterior cerebral arteries are patent without high-grade stenosis. Right-sided posterior communicating artery with small or absent right P1 PCA, anatomic variant. Limited evaluation of distal PCAs due to motion. Anatomic variants: See above. IMPRESSION: 1. Moderate patient motion, limiting evaluation for stenosis or aneurysm. 2. The right ACA is difficult to visualize (particularly distally); however, this could be related in part to congenitally small ACA given suspected partially azygous ACA and prominent left A1 ACA. A right ACA stenosis is not excluded. 3. Otherwise, no large vessel occlusion or evidence of proximal high-grade stenosis. Electronically Signed   By: Margaretha Sheffield M.D.   On: 01/25/2021 15:07   MR BRAIN WO CONTRAST  Result Date: 01/25/2021 CLINICAL DATA:  Transient ischemic attack (TIA) EXAM: MRI HEAD WITHOUT CONTRAST TECHNIQUE: Multiplanar, multiecho pulse sequences of the brain and surrounding structures were obtained without intravenous contrast. COMPARISON:  Same day head CT.  FINDINGS: Mildly motion limited exam.  Within this limitation: Brain: No acute infarction, hemorrhage, hydrocephalus, extra-axial collection or mass lesion. Mild-to-moderate scattered T2 hyperintensities within the white matter, nonspecific but compatible with chronic microvascular ischemic disease. Mild for age atrophy with ex vacuo ventricular dilation. Vascular: Further evaluated on concurrent MRA. Skull and upper cervical spine: Normal marrow signal. Sinuses/Orbits: Mild paranasal sinus mucosal thickening. No acute orbital findings. Other: No mastoid effusions. IMPRESSION: 1. No evidence of acute intracranial  abnormality. 2. Mild-to-moderate chronic microvascular ischemic disease and mild atrophy. Electronically Signed   By: Margaretha Sheffield M.D.   On: 01/25/2021 14:51   EEG adult  Result Date: 01/25/2021 Lora Havens, MD     01/26/2021  9:20 AM Patient Name: Clesta Enwright MRN: [id number] Epilepsy Attending: Lora Havens Referring Physician/Provider: Ollen Gross Date: 01/25/2021 Duration: 24.12 mins Patient history: 75 year old female with speech disturbance.  EEG to look for seizures. Level of alertness: Awake,asleep AEDs during EEG study: None Technical aspects: This EEG study was done with scalp electrodes positioned according to the 10-20 International system of electrode placement. Electrical activity was acquired at a sampling rate of '500Hz'$  and reviewed with a high frequency filter of '70Hz'$  and a low frequency filter of '1Hz'$ . EEG data were recorded continuously and digitally stored. Description: The posterior dominant rhythm consists of 8-9 Hz activity of moderate voltage (25-35 uV) seen predominantly in posterior head regions, symmetric and reactive to eye opening and eye closing. Sleep was characterized by vertex waves, sleep spindles (12 to 14 Hz), maximal frontocentral region. Hyperventilation and photic stimulation were not performed.   IMPRESSION: This study is within normal limits. No seizures or epileptiform discharges were seen throughout the recording. Priyanka [name]   Overnight EEG with video  Result Date: 01/26/2021 Lora Havens, MD     01/26/2021  9:25 AM Patient Name: Darrelle Hendrich MRN: [id number] Epilepsy Attending: Lora Havens Referring Physician/Provider: Dr Amie Portland Duration: 01/25/2021 2224 to 01/26/2021 0920  Patient history: 75 year old female with speech disturbance.  EEG to look for seizures.  Level of alertness: Awake,asleep  AEDs during EEG study: None  Technical aspects: This EEG study was done with scalp electrodes positioned according  to the 10-20 International system of electrode placement. Electrical activity was acquired at a sampling rate of '500Hz'$  and reviewed with a high frequency filter of '70Hz'$  and a low frequency filter of '1Hz'$ . EEG data were recorded continuously and digitally stored.  Description: The posterior dominant rhythm consists of 8-9 Hz activity of moderate voltage (25-35 uV) seen predominantly in posterior head regions, symmetric and reactive to eye opening and eye closing. Drowsiness was characterized by attenuation of the posterior background rhythm. Sleep was characterized by vertex waves, sleep spindles (12 to 14 Hz), maximal frontocentral region. Hyperventilation and photic stimulation were not performed.    IMPRESSION: This study is within normal limits. No seizures or epileptiform discharges were seen throughout the recording.  Priyanka [name]   CT ANGIO HEAD NECK W WO CM W PERF (CODE STROKE)  Result Date: 01/25/2021 CLINICAL DATA:  Neuro deficit, acute, stroke suspected EXAM: CT ANGIOGRAPHY HEAD AND NECK TECHNIQUE: Multidetector CT imaging of the head and neck was performed using the standard protocol during bolus administration of intravenous contrast. Multiplanar CT image reconstructions and MIPs were obtained to evaluate the vascular anatomy. Carotid stenosis measurements (when applicable) are obtained utilizing NASCET criteria, using the distal internal carotid diameter as the denominator. Multiphase CT imaging of the  brain was performed following IV bolus contrast injection. Subsequent parametric perfusion maps were calculated using RAPID software. CONTRAST:  141m OMNIPAQUE IOHEXOL 350 MG/ML SOLN COMPARISON:  Same day MRI/MRA. FINDINGS: CT HEAD FINDINGS Brain: No evidence of acute large vascular territory infarction, hemorrhage, hydrocephalus, extra-axial collection or mass lesion/mass effect. Mild-to-moderate patchy white matter hypoattenuation, nonspecific but compatible with chronic microvascular ischemic  disease. Mild atrophy. Vascular: See below. Skull: No acute fracture. Sinuses: Mild paranasal sinus mucosal thickening. Orbits: No acute finding. Review of the MIP images confirms the above findings CTA NECK FINDINGS Aortic arch: Calcific and noncalcific atherosclerosis of the aorta. Great vessel origins are patent. Right carotid system: Motion limited evaluation. The common carotid artery and internal carotid artery are patent without evidence of significant (greater than 50%) stenosis. Mild atherosclerosis at the carotid bifurcation. Left carotid system: Motion limited evaluation. Nondiagnostic evaluation of portions of the common carotid artery due to severe motion. Otherwise, the visible portions of the left common carotid artery and internal carotid artery are patent. There is moderate calcific atherosclerosis at the carotid bifurcation with proximally 40% stenosis. Vertebral arteries: Motion limited evaluation of the vertebral artery origins with potentially moderate right vertebral artery origin stenosis. The left vertebral artery is small throughout its course. Skeleton: Multilevel degenerative change of the cervical spine with likely at least moderate canal and bilateral foraminal stenosis C4-C5. Other neck: Heterogeneous enlarged thyroid gland with thyroid nodules that are poorly evaluated due to motion. The thyroid is better evaluated on ultrasound from Oct 13, 2018. Upper chest: Emphysema.  Visualized lung apices are clear. Review of the MIP images confirms the above findings CTA HEAD FINDINGS Anterior circulation: Motion limited evaluation of the intracranial ICAs. Calcific atherosclerosis of the intracranial ICAs with likely mild narrowing bilaterally. Bilateral M1 MCAs and proximal M2 MCAs are patent without high-grade stenosis. The right ACA is smal (similar to recent MRA) with prominent left ACA and likely partly azygous and potentially congenital. Posterior circulation: Small left intradural  vertebral artery, which appears to largely terminate as PICA. Mild atherosclerotic narrowing of the right proximal intradural vertebral artery. The basilar artery and bilateral posterior cerebral arteries are patent without proximal hemodynamically significant stenosis. Right posterior communicating artery with small right P1 PCA, anatomic variant. Venous sinuses: As permitted by contrast timing, patent. Small left transverse sinus. Anatomic variants: As detailed above. Review of the MIP images confirms the above findings CT Brain Perfusion Findings: ASPECTS: 10 CBF (<30%) Volume: 042mPerfusion (Tmax>6.0s) volume: 2028mismatch Volume: 20ML. Located in the anterior left temporal lobe and anterior/inferior right frontal lobe. Infarction Location:None. IMPRESSION: CT head: 1. No evidence of acute large vascular territory infarct or acute hemorrhage. ASPECTS 10. 2. Mild-to-moderate chronic microvascular ischemic disease. CTA head: 1. No large vessel occlusion or proximal hemodynamically significant stenosis. 2. 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 CTA Neck: 1. Motion limited evaluation with nondiagnostic evaluation in areas, as detailed above. 2. Bilateral carotid bifurcation atherosclerosis with approximately 40% stenosis of the left ICA origin. 3. Motion limited evaluation of the vertebral artery origins with potentially moderate right vertebral artery origin stenosis. The left vertebral artery is small throughout its course. 4. Multilevel degenerative change of the cervical spine with likely at least moderate canal and bilateral foraminal stenosis C4-C5. An MRI of the cervical spine could further characterize the canal and foramina if clinically indicated. 5. Heterogeneous enlarged thyroid gland with thyroid nodules that are poorly evaluated due  to motion. The thyroid is better evaluated on ultrasound from Oct 13, 2018. 6. Aortic  Atherosclerosis (ICD10-I70.0) and Emphysema (ICD10-J43.9). CT Perfusion: 1. RAPID detects approximally 20 mL of mismatch in the left temporal lobe and inferior right frontal lobe. This finding is indeterminate, but may be in part artifactual given involvement of two different vascular territories and given mismatches in areas that are prone to artifact (inferior/anterior temporal and frontal regions). A repeat MRI could further evaluate if clinically indicated. 2. No evidence of core infarct. Code stroke imaging results were communicated on 01/25/2021 at 4:30 pm to provider Dr. Maurine Minister Via telephone, who verbally acknowledged these results. Electronically Signed   By: Margaretha Sheffield M.D.   On: 01/25/2021 16:56    Scheduled Meds:  amLODipine  10 mg Oral Daily   anastrozole  1 mg Oral Daily   aspirin EC  81 mg Oral Daily   carvedilol  6.25 mg Oral BID WC   enoxaparin (LOVENOX) injection  40 mg Subcutaneous Daily   insulin aspart  0-15 Units Subcutaneous TID WC   insulin aspart  0-5 Units Subcutaneous QHS   roflumilast  500 mcg Oral Daily   rosuvastatin  40 mg Oral Daily   spironolactone  25 mg Oral Daily   thiamine  100 mg Oral QODAY   Continuous Infusions:  sodium chloride       LOS: 1 day   Marylu Lund, MD [facility] Pager On Amion  If 7PM-7AM, please contact night-coverage 01/26/2021, 5:17 PM

## 2021-01-26 NOTE — Progress Notes (Signed)
D/C patient. No skin break down noted.

## 2021-01-27 ENCOUNTER — Other Ambulatory Visit (HOSPITAL_COMMUNITY): Payer: Self-pay

## 2021-01-27 DIAGNOSIS — R569 Unspecified convulsions: Secondary | ICD-10-CM | POA: Diagnosis not present

## 2021-01-27 DIAGNOSIS — R479 Unspecified speech disturbances: Secondary | ICD-10-CM | POA: Diagnosis not present

## 2021-01-27 DIAGNOSIS — R4789 Other speech disturbances: Secondary | ICD-10-CM | POA: Diagnosis not present

## 2021-01-27 LAB — CBG MONITORING, ED
Glucose-Capillary: 126 mg/dL — ABNORMAL HIGH (ref 70–99)
Glucose-Capillary: 129 mg/dL — ABNORMAL HIGH (ref 70–99)
Glucose-Capillary: 172 mg/dL — ABNORMAL HIGH (ref 70–99)

## 2021-01-27 MED ORDER — IBUPROFEN 400 MG PO TABS
400.0000 mg | ORAL_TABLET | Freq: Four times a day (QID) | ORAL | 0 refills | Status: DC | PRN
  Filled 2021-01-27: qty 30, 8d supply, fill #0

## 2021-01-27 MED ORDER — METHOCARBAMOL 500 MG PO TABS
500.0000 mg | ORAL_TABLET | Freq: Three times a day (TID) | ORAL | 0 refills | Status: DC | PRN
  Filled 2021-01-27: qty 20, 7d supply, fill #0

## 2021-01-27 NOTE — Discharge Summary (Signed)
Physician Discharge Summary  Victoria [name] XBL:[id number] DOB: 02/23/1946 DOA: 01/25/2021  PCP: Janith Lima, MD  Admit date: 01/25/2021 Discharge date: 01/27/2021  Admitted From: Home Disposition:  Home  Recommendations for Outpatient Follow-up:  Follow up with PCP in 1-2 weeks  Discharge Condition:Stable CODE STATUS:Full Diet recommendation: Regular   Brief/Interim Summary: 75 y.o. female, with history of breast cancer, COPD, hyperlipidemia, hypertension, obstructive sleep apnea came to ED with complaints of speech abnormality with word finding difficulty.  As per patient's niece patient was normal at 9:30 AM and then she was noted to have some garbled and confused speech.  She also had word finding difficulty.  She was brought to the ED for further evaluation.  She complained of frontal headache along with pain in her sinuses.    Discharge Diagnoses:  Active Problems:   Seizure ([place])   Word finding difficulty Initial concerns for seizure activity Pt underwent stat EEG and LTM EEG overnight that were noted by Neurology to be unremarkable Initial work up found to be unremarkable, patient returned to baseline state, and pt was eager to go home, initially cleared by Neurology for d/c Later in afternoon, patient had acute headache located over L cheek radiating to back of neck associated with nausea Pt re-evaluated at bedside with Neurology present. EEG was continued. Of note, palpable muscle spasm noted over posterior aspect of L neck Pt reports improvement with trial of robaxin with toradol Discussed with Neurology, clear for d/c from Neurology standpoint Hypertension BP labile Continue home amlodipine, Carvedilol,, Aldactone.   COPD Noted to be on 2 L/min at baseline No wheezing, stable this visit Diabetes mellitus type 2 Cont to hold Janumet XR while in hospital Cont on SSI coverage History of breast cancer diagnosed in January 2019 on routine screening mammogram,  biopsy confirmed invasive mammary carcinoma, estrogen receptors and progesterone receptor positive, underwent left lumpectomy.   Currently maintained on anastrozole.  We will continue with anastrozole in the hospital.   Discharge Instructions   Allergies as of 01/27/2021       Reactions   Benicar [olmesartan] Swelling   Swelling of face and arms    [name] [valsartan] Swelling   Swelling of face and arms    Hydrocodone-acetaminophen Nausea And Vomiting   Severe vomiting   Lisinopril Cough   Monosodium Glutamate Other (See Comments)   Facial swelling per pt   Codeine Other (See Comments)   jittery   Lead Acetate Rash   Nickel Rash   Severe rash to infection: pt is allergic to all metals other than sterling silver or gold jewelry.         Medication List     STOP taking these medications    azithromycin 250 MG tablet Commonly known as: ZITHROMAX       TAKE these medications    Accu-Chek Aviva Plus w/Device Kit 1 Device by Does not apply route daily. Use to monitor glucose levels once per day; E11.9   Accu-Chek Softclix Lancets lancets USE AS DIRECTED ONCE DAILY   acetaminophen 500 MG tablet Commonly known as: TYLENOL Take 1,000 mg by mouth every 6 (six) hours as needed.   amLODipine 10 MG tablet Commonly known as: NORVASC Take 1 tablet (10 mg total) by mouth daily.   anastrozole 1 MG tablet Commonly known as: ARIMIDEX Take 1 tablet by mouth once daily   aspirin EC 81 MG tablet Take 1 tablet (81 mg total) by mouth daily.   carvedilol 6.25 MG tablet Commonly  known as: COREG Take 1 tablet (6.25 mg total) by mouth 2 (two) times daily. What changed: Another medication with the same name was removed. Continue taking this medication, and follow the directions you see here.   Daliresp 500 MCG Tabs tablet Generic drug: roflumilast Take 1 tablet (500 mcg total) by mouth daily.   denosumab 60 MG/ML Soln injection Commonly known as: PROLIA Inject 60 mg into the  skin every 6 (six) months. Administer in upper arm, thigh, or abdomen   glucose blood test strip USE 1 STRIP ONCE DAILY   Accu-Chek Guide test strip Generic drug: glucose blood USE 1 STRIP ONCE DAILY   ibuprofen 400 MG tablet Commonly known as: ADVIL Take 1 tablet (400 mg total) by mouth every 6 (six) hours as needed.   Janumet XR 50-1000 MG Tb24 Generic drug: SitaGLIPtin-MetFORMIN HCl Take 2 tablets by mouth daily.   methocarbamol 500 MG tablet Commonly known as: ROBAXIN Take 1 tablet (500 mg total) by mouth every 8 (eight) hours as needed for muscle spasms.   OXYGEN Inhale 2-3 L into the lungs continuous. When exerting self   potassium chloride SA 20 MEQ tablet Commonly known as: KLOR-CON Take 1 tablet (20 mEq total) by mouth 2 (two) times daily.   ProAir RespiClick 832 (90 Base) MCG/ACT Aepb Generic drug: Albuterol Sulfate Inhale 1-2 puffs into the lungs every 6 (six) hours as needed (for wheezing/shortness of breath).   albuterol (2.5 MG/3ML) 0.083% nebulizer solution Commonly known as: PROVENTIL Take 3 mLs (2.5 mg total) by nebulization every 6 (six) hours as needed for wheezing or shortness of breath.   rosuvastatin 40 MG tablet Commonly known as: CRESTOR Take 1 tablet (40 mg total) by mouth daily.   spironolactone 25 MG tablet Commonly known as: Aldactone Take 1 tablet (25 mg total) by mouth daily.   thiamine 100 MG tablet Commonly known as: Vitamin B-1 Take 1 tablet (100 mg total) by mouth every other day.   Trelegy Ellipta 100-62.5-25 MCG/INH Aepb Generic drug: Fluticasone-Umeclidin-Vilant inhale ONE DOSE into THE lungs ONCE DAILY   Vitamin D 125 MCG (5000 UT) Caps Take 1 capsule by mouth daily.        Follow-up Information     Janith Lima, MD Follow up.   Specialty: Internal Medicine Why: Hospital follow up Contact information: [place] [address] [phone]         Troy Sine, MD .   Specialty:  Cardiology Contact information: [address] [place] [address] [phone]                Allergies  Allergen Reactions   Benicar [Olmesartan] Swelling    Swelling of face and arms    [name] [Valsartan] Swelling    Swelling of face and arms    Hydrocodone-Acetaminophen Nausea And Vomiting    Severe vomiting   Lisinopril Cough   Monosodium Glutamate Other (See Comments)    Facial swelling per pt   Codeine Other (See Comments)    jittery   Lead Acetate Rash   Nickel Rash    Severe rash to infection: pt is allergic to all metals other than sterling silver or gold jewelry.     Consultations: Neurology  Procedures/Studies: CT HEAD WO CONTRAST (5MM)  Result Date: 01/25/2021 CLINICAL DATA:  Headache and memory issues. EXAM: CT HEAD WITHOUT CONTRAST TECHNIQUE: Contiguous axial images were obtained from the base of the skull through the vertex without intravenous contrast. COMPARISON:  None. FINDINGS: Brain: No evidence of acute infarction, hemorrhage, hydrocephalus, extra-axial collection or mass lesion/mass effect. Mild generalized cerebral atrophy. Scattered mild periventricular and subcortical white matter hypodensities are nonspecific, but favored to reflect chronic microvascular ischemic changes. Vascular: Atherosclerotic vascular calcification of the carotid siphons. No hyperdense vessel. Skull: Normal. Negative for fracture or focal lesion. Sinuses/Orbits: No acute finding. Other: None. IMPRESSION: 1. No acute intracranial abnormality. 2. Mild cerebral atrophy and chronic microvascular ischemic changes. Electronically Signed   By: Titus Dubin M.D.   On: 01/25/2021 13:20   MR ANGIO HEAD WO CONTRAST  Result Date: 01/25/2021 CLINICAL DATA:  Headache, chronic, no new features EXAM: MRA HEAD WITHOUT CONTRAST TECHNIQUE: Angiographic images of the Circle of Willis were acquired using MRA technique without intravenous contrast. COMPARISON:  No pertinent prior  exam. FINDINGS: Moderately motion limited study. Anterior circulation: Bilateral intracranial ICAs, MCAs are patent without high-grade proximal stenosis. The right ACA is difficult to visualize (particularly distally); however, this could be related in part to congenitally small ACA given suspected partially azygous ACA with prominent left A1 ACA. Superimposed stenosis is not excluded. The left ACA is patent without proximal high-grade stenosis. Limited evaluation of distal vessel due to motion. Posterior circulation: Motion limited evaluation of the proximal intradural vertebral arteries bilaterally. The left vertebral artery is small/non dominant and poorly assessed. The right vertebral artery is patent. The basilar artery and proximal posterior cerebral arteries are patent without high-grade stenosis. Right-sided posterior communicating artery with small or absent right P1 PCA, anatomic variant. Limited evaluation of distal PCAs due to motion. Anatomic variants: See above. IMPRESSION: 1. Moderate patient motion, limiting evaluation for stenosis or aneurysm. 2. The right ACA is difficult to visualize (particularly distally); however, this could be related in part to congenitally small ACA given suspected partially azygous ACA and prominent left A1 ACA. A right ACA stenosis is not excluded. 3. Otherwise, no large vessel occlusion or evidence of proximal high-grade stenosis. Electronically Signed   By: Margaretha Sheffield M.D.   On: 01/25/2021 15:07   MR BRAIN WO CONTRAST  Result Date: 01/25/2021 CLINICAL DATA:  Transient ischemic attack (TIA) EXAM: MRI HEAD WITHOUT CONTRAST TECHNIQUE: Multiplanar, multiecho pulse sequences of the brain and surrounding structures were obtained without intravenous contrast. COMPARISON:  Same day head CT. FINDINGS: Mildly motion limited exam.  Within this limitation: Brain: No acute infarction, hemorrhage, hydrocephalus, extra-axial collection or mass lesion. Mild-to-moderate  scattered T2 hyperintensities within the white matter, nonspecific but compatible with chronic microvascular ischemic disease. Mild for age atrophy with ex vacuo ventricular dilation. Vascular: Further evaluated on concurrent MRA. Skull and upper cervical spine: Normal marrow signal. Sinuses/Orbits: Mild paranasal sinus mucosal thickening. No acute orbital findings. Other: No mastoid effusions. IMPRESSION: 1. No evidence of acute intracranial abnormality. 2. Mild-to-moderate chronic microvascular ischemic disease and mild atrophy. Electronically Signed   By: Margaretha Sheffield M.D.   On: 01/25/2021 14:51   EEG adult  Result Date: 01/25/2021 Lora Havens, MD     01/26/2021  9:20 AM Patient Name: Erik Nessel MRN: [id number] Epilepsy Attending: Lora Havens Referring Physician/Provider: Ollen Gross Date: 01/25/2021 Duration: 24.12 mins Patient history: 75 year old female with speech disturbance.  EEG to look for seizures. Level of alertness: Awake,asleep AEDs during EEG study: None Technical aspects: This EEG study was done with scalp electrodes positioned according to the 10-20 International system of electrode placement. Electrical activity was acquired at a sampling rate of _0  and reviewed with a high frequency filter  of _0  and a low frequency filter of _1 . EEG data were recorded continuously and digitally stored. Description: The posterior dominant rhythm consists of 8-9 Hz activity of moderate voltage (25-35 uV) seen predominantly in posterior head regions, symmetric and reactive to eye opening and eye closing. Sleep was characterized by vertex waves, sleep spindles (12 to 14 Hz), maximal frontocentral region. Hyperventilation and photic stimulation were not performed.   IMPRESSION: This study is within normal limits. No seizures or epileptiform discharges were seen throughout the recording. Priyanka [name]   Overnight EEG with video  Result Date: 01/26/2021 Lora Havens, MD     01/27/2021  8:59 AM Patient Name: Jon Kasparek MRN: [id number] Epilepsy Attending: Lora Havens Referring Physician/Provider: Dr Amie [place] Duration: 01/25/2021 2224 to 01/26/2021 1433  Patient history: 75 year old female with speech disturbance.  EEG to look for seizures.  Level of alertness: Awake,asleep  AEDs during EEG study: None  Technical aspects: This EEG study was done with scalp electrodes positioned according to the 10-20 International system of electrode placement. Electrical activity was acquired at a sampling rate of _2  and reviewed with a high frequency filter of _3  and a low frequency filter of _4 . EEG data were recorded continuously and digitally stored.  Description: The posterior dominant rhythm consists of 8-9 Hz activity of moderate voltage (25-35 uV) seen predominantly in posterior head regions, symmetric and reactive to eye opening and eye closing. Drowsiness was characterized by attenuation of the posterior background rhythm. Sleep was characterized by vertex waves, sleep spindles (12 to 14 Hz), maximal frontocentral region. Hyperventilation and photic stimulation were not performed.    IMPRESSION: This study is within normal limits. No seizures or epileptiform discharges were seen throughout the recording.  Priyanka [name]   CT ANGIO HEAD NECK W WO CM W PERF (CODE STROKE)  Result Date: 01/25/2021 CLINICAL DATA:  Neuro deficit, acute, stroke suspected EXAM: CT ANGIOGRAPHY HEAD AND NECK TECHNIQUE: Multidetector CT imaging of the head and neck was performed using the standard protocol during bolus administration of intravenous contrast. Multiplanar CT image reconstructions and MIPs were obtained to evaluate the vascular anatomy. Carotid stenosis measurements (when applicable) are obtained utilizing NASCET criteria, using the distal internal carotid diameter as the denominator. Multiphase CT imaging of the brain was performed following IV bolus contrast  injection. Subsequent parametric perfusion maps were calculated using RAPID software. CONTRAST:  172m OMNIPAQUE IOHEXOL 350 MG/ML SOLN COMPARISON:  Same day MRI/MRA. FINDINGS: CT HEAD FINDINGS Brain: No evidence of acute large vascular territory infarction, hemorrhage, hydrocephalus, extra-axial collection or mass lesion/mass effect. Mild-to-moderate patchy white matter hypoattenuation, nonspecific but compatible with chronic microvascular ischemic disease. Mild atrophy. Vascular: See below. Skull: No acute fracture. Sinuses: Mild paranasal sinus mucosal thickening. Orbits: No acute finding. Review of the MIP images confirms the above findings CTA NECK FINDINGS Aortic arch: Calcific and noncalcific atherosclerosis of the aorta. Great vessel origins are patent. Right carotid system: Motion limited evaluation. The common carotid artery and internal carotid artery are patent without evidence of significant (greater than 50%) stenosis. Mild atherosclerosis at the carotid bifurcation. Left carotid system: Motion limited evaluation. Nondiagnostic evaluation of portions of the common carotid artery due to severe motion. Otherwise, the visible portions of the left common carotid artery and internal carotid artery are patent. There is moderate calcific atherosclerosis at the carotid bifurcation with proximally 40% stenosis. Vertebral arteries: Motion limited evaluation of the vertebral artery origins with potentially moderate right vertebral artery origin stenosis. The  left vertebral artery is small throughout its course. Skeleton: Multilevel degenerative change of the cervical spine with likely at least moderate canal and bilateral foraminal stenosis C4-C5. Other neck: Heterogeneous enlarged thyroid gland with thyroid nodules that are poorly evaluated due to motion. The thyroid is better evaluated on ultrasound from Oct 13, 2018. Upper chest: Emphysema.  Visualized lung apices are clear. Review of the MIP images confirms  the above findings CTA HEAD FINDINGS Anterior circulation: Motion limited evaluation of the intracranial ICAs. Calcific atherosclerosis of the intracranial ICAs with likely mild narrowing bilaterally. Bilateral M1 MCAs and proximal M2 MCAs are patent without high-grade stenosis. The right ACA is smal (similar to recent MRA) with prominent left ACA and likely partly azygous and potentially congenital. Posterior circulation: Small left intradural vertebral artery, which appears to largely terminate as PICA. Mild atherosclerotic narrowing of the right proximal intradural vertebral artery. The basilar artery and bilateral posterior cerebral arteries are patent without proximal hemodynamically significant stenosis. Right posterior communicating artery with small right P1 PCA, anatomic variant. Venous sinuses: As permitted by contrast timing, patent. Small left transverse sinus. Anatomic variants: As detailed above. Review of the MIP images confirms the above findings CT Brain Perfusion Findings: ASPECTS: 10 CBF (<30%) Volume: 29m Perfusion (Tmax>6.0s) volume: 257mMismatch Volume: 20ML. Located in the anterior left temporal lobe and anterior/inferior right frontal lobe. Infarction Location:None. IMPRESSION: CT head: 1. No evidence of acute large vascular territory infarct or acute hemorrhage. ASPECTS 10. 2. Mild-to-moderate chronic microvascular ischemic disease. CTA head: 1. No large vessel occlusion or proximal hemodynamically significant stenosis. 2. 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 CTA Neck: 1. Motion limited evaluation with nondiagnostic evaluation in areas, as detailed above. 2. Bilateral carotid bifurcation atherosclerosis with approximately 40% stenosis of the left ICA origin. 3. Motion limited evaluation of the vertebral artery origins with potentially moderate right vertebral artery origin stenosis. The left vertebral  artery is small throughout its course. 4. Multilevel degenerative change of the cervical spine with likely at least moderate canal and bilateral foraminal stenosis C4-C5. An MRI of the cervical spine could further characterize the canal and foramina if clinically indicated. 5. Heterogeneous enlarged thyroid gland with thyroid nodules that are poorly evaluated due to motion. The thyroid is better evaluated on ultrasound from Oct 13, 2018. 6. Aortic Atherosclerosis (ICD10-I70.0) and Emphysema (ICD10-J43.9). CT Perfusion: 1. RAPID detects approximally 20 mL of mismatch in the left temporal lobe and inferior right frontal lobe. This finding is indeterminate, but may be in part artifactual given involvement of two different vascular territories and given mismatches in areas that are prone to artifact (inferior/anterior temporal and frontal regions). A repeat MRI could further evaluate if clinically indicated. 2. No evidence of core infarct. Code stroke imaging results were communicated on 01/25/2021 at 4:30 pm to provider Dr. BhMaurine Ministeria telephone, who verbally acknowledged these results. Electronically Signed   By: FrMargaretha Sheffield.D.   On: 01/25/2021 16:56    Subjective: Eager to go home  Discharge Exam: Vitals:   01/27/21 1300 01/27/21 1400  BP: 125/82 (!) 142/61  Pulse: 79 81  Resp: (!) 25 (!) 22  Temp:    SpO2: 95% 93%   Vitals:   01/27/21 0747 01/27/21 1102 01/27/21 1300 01/27/21 1400  BP: (!) 158/73 (!) 124/50 125/82 (!) 142/61  Pulse: 84 65 79 81  Resp: 14 (!) 21 (!) 25 (!) 22  Temp: 98.3 F (36.8  C)     TempSrc:      SpO2: 98% 98% 95% 93%  Weight:      Height:        General: Pt is alert, awake, not in acute distress Cardiovascular: RRR, S1/S2 + Respiratory: CTA bilaterally, no wheezing, no rhonchi Abdominal: Soft, NT, ND, bowel sounds + Extremities: no edema, no cyanosis   The results of significant diagnostics from this hospitalization (including imaging, microbiology,  ancillary and laboratory) are listed below for reference.     Microbiology: Recent Results (from the past 240 hour(s))  Resp Panel by RT-PCR (Flu A&B, Covid) Nasopharyngeal Swab     Status: None   Collection Time: 01/25/21  4:54 PM   Specimen: Nasopharyngeal Swab; Nasopharyngeal(NP) swabs in vial transport medium  Result Value Ref Range Status   SARS Coronavirus 2 by RT PCR NEGATIVE NEGATIVE Final    Comment: (NOTE) SARS-CoV-2 target nucleic acids are NOT DETECTED.  The SARS-CoV-2 RNA is generally detectable in upper respiratory specimens during the acute phase of infection. The lowest concentration of SARS-CoV-2 viral copies this assay can detect is 138 copies/mL. A negative result does not preclude SARS-Cov-2 infection and should not be used as the sole basis for treatment or other patient management decisions. A negative result may occur with  improper specimen collection/handling, submission of specimen other than nasopharyngeal swab, presence of viral mutation(s) within the areas targeted by this assay, and inadequate number of viral copies(<138 copies/mL). A negative result must be combined with clinical observations, patient history, and epidemiological information. The expected result is Negative.  Fact Sheet for Patients:  EntrepreneurPulse.com.au  Fact Sheet for Healthcare Providers:  IncredibleEmployment.be  This test is no t yet approved or cleared by the [place] FDA and  has been authorized for detection and/or diagnosis of SARS-CoV-2 by FDA under an Emergency Use Authorization (EUA). This EUA will remain  in effect (meaning this test can be used) for the duration of the COVID-19 declaration under Section 564(b)(1) of the Act, 21 U.S.C.section 360bbb-3(b)(1), unless the authorization is terminated  or revoked sooner.       Influenza A by PCR NEGATIVE NEGATIVE Final   Influenza B by PCR NEGATIVE NEGATIVE Final     Comment: (NOTE) The Xpert Xpress SARS-CoV-2/FLU/RSV plus assay is intended as an aid in the diagnosis of influenza from Nasopharyngeal swab specimens and should not be used as a sole basis for treatment. Nasal washings and aspirates are unacceptable for Xpert Xpress SARS-CoV-2/FLU/RSV testing.  Fact Sheet for Patients: EntrepreneurPulse.com.au  Fact Sheet for Healthcare Providers: IncredibleEmployment.be  This test is not yet approved or cleared by the [place] FDA and has been authorized for detection and/or diagnosis of SARS-CoV-2 by FDA under an Emergency Use Authorization (EUA). This EUA will remain in effect (meaning this test can be used) for the duration of the COVID-19 declaration under Section 564(b)(1) of the Act, 21 U.S.C. section 360bbb-3(b)(1), unless the authorization is terminated or revoked.  Performed at [facility], [place] [address], [place], [place] [address]      Labs: BNP (last 3 results) No results for input(s): BNP in the last 8760 hours. Basic Metabolic Panel: Recent Labs  Lab 01/25/21 1208 01/26/21 0418  NA 140 137  K 4.3 3.7  CL 106 102  CO2 26 27  GLUCOSE 183* 178*  BUN 11 7*  CREATININE 0.56 0.65  CALCIUM 9.4 8.9   Liver Function Tests: Recent Labs  Lab 01/25/21 1208 01/26/21 0418  AST  16 13*  ALT 8 8  ALKPHOS 72 61  BILITOT 0.5 0.6  PROT 7.2 6.2*  ALBUMIN 3.8 3.0*   No results for input(s): LIPASE, AMYLASE in the last 168 hours. No results for input(s): AMMONIA in the last 168 hours. CBC: Recent Labs  Lab 01/25/21 1208 01/26/21 0418  WBC 11.6* 9.7  HGB 12.4 12.3  HCT 39.1 37.6  MCV 86.3 86.0  PLT 225 193   Cardiac Enzymes: No results for input(s): CKTOTAL, CKMB, CKMBINDEX, TROPONINI in the last 168 hours. BNP: Invalid input(s): POCBNP CBG: Recent Labs  Lab 01/26/21 0702 01/26/21 1302 01/27/21 0009 01/27/21 0749 01/27/21 1154  GLUCAP 112* 218* 172* 129*  126*   D-Dimer No results for input(s): DDIMER in the last 72 hours. Hgb A1c Recent Labs    01/25/21 1208  HGBA1C 6.3*   Lipid Profile No results for input(s): CHOL, HDL, LDLCALC, TRIG, CHOLHDL, LDLDIRECT in the last 72 hours. Thyroid function studies No results for input(s): TSH, T4TOTAL, T3FREE, THYROIDAB in the last 72 hours.  Invalid input(s): FREET3 Anemia work up No results for input(s): VITAMINB12, FOLATE, FERRITIN, TIBC, IRON, RETICCTPCT in the last 72 hours. Urinalysis    Component Value Date/Time   COLORURINE YELLOW (A) 01/25/2021 1228   APPEARANCEUR CLEAR (A) 01/25/2021 1228   LABSPEC 1.020 01/25/2021 1228   PHURINE 6.0 01/25/2021 1228   GLUCOSEU NEGATIVE 01/25/2021 1228   GLUCOSEU NEGATIVE 04/19/2019 1208   HGBUR TRACE (A) 01/25/2021 1228   BILIRUBINUR NEGATIVE 01/25/2021 1228   KETONESUR NEGATIVE 01/25/2021 1228   PROTEINUR NEGATIVE 01/25/2021 1228   UROBILINOGEN 0.2 04/19/2019 1208   NITRITE NEGATIVE 01/25/2021 1228   LEUKOCYTESUR NEGATIVE 01/25/2021 1228   Sepsis Labs Invalid input(s): PROCALCITONIN,  WBC,  LACTICIDVEN Microbiology Recent Results (from the past 240 hour(s))  Resp Panel by RT-PCR (Flu A&B, Covid) Nasopharyngeal Swab     Status: None   Collection Time: 01/25/21  4:54 PM   Specimen: Nasopharyngeal Swab; Nasopharyngeal(NP) swabs in vial transport medium  Result Value Ref Range Status   SARS Coronavirus 2 by RT PCR NEGATIVE NEGATIVE Final    Comment: (NOTE) SARS-CoV-2 target nucleic acids are NOT DETECTED.  The SARS-CoV-2 RNA is generally detectable in upper respiratory specimens during the acute phase of infection. The lowest concentration of SARS-CoV-2 viral copies this assay can detect is 138 copies/mL. A negative result does not preclude SARS-Cov-2 infection and should not be used as the sole basis for treatment or other patient management decisions. A negative result may occur with  improper specimen collection/handling, submission  of specimen other than nasopharyngeal swab, presence of viral mutation(s) within the areas targeted by this assay, and inadequate number of viral copies(<138 copies/mL). A negative result must be combined with clinical observations, patient history, and epidemiological information. The expected result is Negative.  Fact Sheet for Patients:  EntrepreneurPulse.com.au  Fact Sheet for Healthcare Providers:  IncredibleEmployment.be  This test is no t yet approved or cleared by the [place] FDA and  has been authorized for detection and/or diagnosis of SARS-CoV-2 by FDA under an Emergency Use Authorization (EUA). This EUA will remain  in effect (meaning this test can be used) for the duration of the COVID-19 declaration under Section 564(b)(1) of the Act, 21 U.S.C.section 360bbb-3(b)(1), unless the authorization is terminated  or revoked sooner.       Influenza A by PCR NEGATIVE NEGATIVE Final   Influenza B by PCR NEGATIVE NEGATIVE Final    Comment: (NOTE) The Xpert Xpress SARS-CoV-2/FLU/RSV plus  assay is intended as an aid in the diagnosis of influenza from Nasopharyngeal swab specimens and should not be used as a sole basis for treatment. Nasal washings and aspirates are unacceptable for Xpert Xpress SARS-CoV-2/FLU/RSV testing.  Fact Sheet for Patients: EntrepreneurPulse.com.au  Fact Sheet for Healthcare Providers: IncredibleEmployment.be  This test is not yet approved or cleared by the [place] FDA and has been authorized for detection and/or diagnosis of SARS-CoV-2 by FDA under an Emergency Use Authorization (EUA). This EUA will remain in effect (meaning this test can be used) for the duration of the COVID-19 declaration under Section 564(b)(1) of the Act, 21 U.S.C. section 360bbb-3(b)(1), unless the authorization is terminated or revoked.  Performed at [facility], [place]  [address] [place], [place] [address]    Time spent: 68mn  SIGNED:   SMarylu Lund MD  [facility] 01/27/2021, 3:05 PM  If 7PM-7AM, please contact night-coverage

## 2021-02-11 DIAGNOSIS — G4733 Obstructive sleep apnea (adult) (pediatric): Secondary | ICD-10-CM | POA: Diagnosis not present

## 2021-02-11 DIAGNOSIS — J449 Chronic obstructive pulmonary disease, unspecified: Secondary | ICD-10-CM | POA: Diagnosis not present

## 2021-02-11 DIAGNOSIS — I10 Essential (primary) hypertension: Secondary | ICD-10-CM | POA: Diagnosis not present

## 2021-02-23 ENCOUNTER — Ambulatory Visit (INDEPENDENT_AMBULATORY_CARE_PROVIDER_SITE_OTHER): Payer: Medicare Other | Admitting: Endocrinology

## 2021-02-23 ENCOUNTER — Other Ambulatory Visit: Payer: Self-pay

## 2021-02-23 VITALS — BP 200/64 | HR 99 | Ht 64.5 in | Wt 202.8 lb

## 2021-02-23 DIAGNOSIS — Z23 Encounter for immunization: Secondary | ICD-10-CM | POA: Diagnosis not present

## 2021-02-23 DIAGNOSIS — E042 Nontoxic multinodular goiter: Secondary | ICD-10-CM | POA: Diagnosis not present

## 2021-02-23 DIAGNOSIS — E118 Type 2 diabetes mellitus with unspecified complications: Secondary | ICD-10-CM | POA: Diagnosis not present

## 2021-02-23 LAB — POCT GLYCOSYLATED HEMOGLOBIN (HGB A1C): Hemoglobin A1C: 6.7 % — AB (ref 4.0–5.6)

## 2021-02-23 NOTE — Patient Instructions (Addendum)
Your blood pressure is high today.  Please see your primary care provider soon, to have it rechecked.   Please continue the same Janumet.    check your blood sugar once a day.  vary the time of day when you check, between before the 3 meals, and at bedtime.  also check if you have symptoms of your blood sugar being too high or too low.  please keep a record of the readings and bring it to your next appointment here (or you can bring the meter itself).  You can write it on any piece of paper.  please call us sooner if your blood sugar goes below 70, or if you have a lot of readings over 200.   Let's recheck the ultrasound.  you will receive a phone call, about a day and time for an appointment.  Please come back for a follow-up appointment in 4 months.

## 2021-02-23 NOTE — Progress Notes (Signed)
Subjective:    Patient ID: Victoria [name], female    DOB: [birth year], 75 y.o.   MRN: [id number]  HPI Pt returns for f/u of diabetes mellitus: DM type: 2 Dx'ed: 4388 Complications: CAD and PAD Therapy: Janumet GDM: never DKA: never Severe hypoglycemia: never Pancreatitis: never Pancreatic imaging: normal on 2008 CT.   Other: edema limits rx options; fructosamine showed better glycemic control than A1c.   Interval history: pt says cbg's are well-controlled.  She takes janumet as rx'ed Pt also has hyperthyroidism (dx'ed 2019; US showed MNG; bx of RLL and RML nodules in 2019 showed beth cat 2; she had RAI 11/20; since then, she is euthyroid off rx).   Past Medical History:  Diagnosis Date   Abdominal aneurysm ([place])    Dr. Oneida Alar follows lLOV 2 ''17 per pt "around 2 cm"   Anemia    as a child   Arthritis    left ankle, right knee, right SI joint, wrists, lower back   Breast cancer in female [facility])    Left   COPD (chronic obstructive pulmonary disease) ([place])    ephysema-Dr. 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 Harlow Asa in [facility]   Sleep apnea    cpap used sometimes, uses oxygen concentrator 2 l/m nasally bedtime   Varicella as child    Past Surgical History:  Procedure Laterality Date   APPENDECTOMY     2008   BREAST LUMPECTOMY Left 08/31/2017   BREAST LUMPECTOMY WITH RADIOACTIVE SEED LOCALIZATION Left 08/10/2017   Procedure: BREAST LUMPECTOMY WITH RADIOACTIVE SEED LOCALIZATION;  Surgeon: Rolm Bookbinder, MD;  Location: [facility];  Service: General;  Laterality: Left;    [place]   COLONOSCOPY     COLONOSCOPY WITH PROPOFOL N/A 04/16/2016   Procedure: COLONOSCOPY WITH PROPOFOL;  Surgeon: Carol Ada, MD;  Location: [facility];  Service: Endoscopy;  Laterality: N/A;   CYST REMOVAL NECK Left 04/13/2016   Procedure: EXCISION OF SEBACEOUS CYST LEFT POSTERIOR NECK;  Surgeon: Armandina Gemma, MD;  Location: [facility];  Service: General;  Laterality: Left;   Excision of Pelvic Absess, Right Ovary     2008   RE-EXCISION OF BREAST CANCER,SUPERIOR MARGINS Left 08/31/2017   Procedure: RE-EXCISION OF LEFT  BREAST [place];  Surgeon: Rolm Bookbinder, MD;  Location: [facility];  Service: General;  Laterality: Left;   TUBAL LIGATION  1980    Social History   Socioeconomic History   Marital status: Divorced    Spouse name: Not on file   Number of children: 1   Years of education: 16   Highest education level: Not on file  Occupational History   Occupation: Social worker, Technical sales engineer: [employer]  Tobacco Use   Smoking status: Former    Packs/day: 1.00    Years: 50.00    Pack years: 50.00    Types: Cigarettes    Quit date: 04/16/2011    Years since  quitting: 9.8   Smokeless tobacco: Never   Tobacco comments:    quit that date when she had to go to ER   Vaping Use   Vaping Use: Never used  Substance and Sexual Activity   Alcohol use: Yes    Alcohol/week: 0.0 standard drinks    Comment: rare occasion   Drug use: No    Comment: no marijuana since 2012   Sexual activity: Not Currently  Other Topics Concern   Not on file  Social History Narrative   HSG; [name], [place]. . Married '69 - 9 yrs/divorced. 1 son - '72; no grandchildren.   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 No h/o physical or sexual abuse. ACP - no living will - wants information. Provided packet of information. On 08/05/2011: she stated she was distant cousins to celebrities [name] and [name]      Social Determinants of Health   Financial Resource Strain: Not on file  Food Insecurity: Not on file  Transportation Needs: Not on file  Physical Activity: Not on file  Stress: Not on file  Social Connections: Not on file  Intimate Partner Violence: Not on file    Current Outpatient Medications on File Prior to Visit  Medication Sig Dispense Refill   Accu-Chek Softclix Lancets lancets USE AS DIRECTED ONCE DAILY 100 each 0   acetaminophen (TYLENOL) 500 MG tablet Take 1,000 mg by mouth every 6 (six) hours as needed.     albuterol (PROVENTIL) (2.5 MG/3ML) 0.083% nebulizer solution Take 3 mLs (2.5 mg total) by nebulization every 6 (six) hours as needed for wheezing or shortness of breath. 75 mL 3   Albuterol Sulfate (PROAIR RESPICLICK) 433 (90 Base) MCG/ACT AEPB Inhale 1-2 puffs into the lungs every 6 (six) hours as needed (for wheezing/shortness of breath). 1 each 5   amLODipine (NORVASC) 10 MG tablet Take 1 tablet (10 mg total) by mouth daily. 90 tablet 1   anastrozole (ARIMIDEX) 1 MG tablet Take 1 tablet by mouth once daily 90 tablet 0   aspirin EC 81 MG tablet Take 1 tablet (81 mg total) by mouth daily. 90 tablet 3   Blood Glucose Monitoring Suppl (ACCU-CHEK AVIVA PLUS) w/Device KIT 1 Device by Does not apply route daily. Use to monitor glucose levels once per day; E11.9 1 kit 0   carvedilol (COREG) 6.25 MG tablet Take 1 tablet (6.25 mg total) by mouth 2 (two) times daily. 180 tablet 3   Cholecalciferol (VITAMIN D) 125 MCG (5000 UT) CAPS Take 1 capsule by mouth daily.     denosumab (PROLIA) 60 MG/ML SOLN injection Inject 60 mg into the skin every 6 (six) months. Administer in upper arm, thigh, or abdomen     Fluticasone-Umeclidin-Vilant (TRELEGY ELLIPTA) 100-62.5-25 MCG/INH AEPB inhale ONE DOSE into THE lungs ONCE DAILY 60 each 5   glucose blood (ACCU-CHEK GUIDE) test strip USE 1 STRIP ONCE DAILY 100 each 0   glucose blood test strip USE 1 STRIP ONCE DAILY 100 each 12    ibuprofen (ADVIL) 400 MG tablet Take 1 tablet (400 mg total) by mouth every 6 (six) hours as needed. 30 tablet 0   methocarbamol (ROBAXIN) 500 MG tablet Take 1 tablet (500 mg total) by mouth every 8 (eight) hours as needed for muscle spasms. 20 tablet 0   OXYGEN Inhale 2-3 L into the lungs continuous. When exerting self     potassium chloride SA (KLOR-CON) 20 MEQ tablet Take 1 tablet (20  mEq total) by mouth 2 (two) times daily. 180 tablet 0   roflumilast (DALIRESP) 500 MCG TABS tablet Take 1 tablet (500 mcg total) by mouth daily. 90 tablet 3   rosuvastatin (CRESTOR) 40 MG tablet Take 1 tablet (40 mg total) by mouth daily. 90 tablet 1   SitaGLIPtin-MetFORMIN HCl (JANUMET XR) 50-1000 MG TB24 Take 2 tablets by mouth daily. 180 tablet 3   spironolactone (ALDACTONE) 25 MG tablet Take 1 tablet (25 mg total) by mouth daily. 90 tablet 0   thiamine (VITAMIN B-1) 100 MG tablet Take 1 tablet (100 mg total) by mouth every other day. 45 tablet 1   No current facility-administered medications on file prior to visit.    Allergies  Allergen Reactions   Benicar [Olmesartan] Swelling    Swelling of face and arms    [name] [Valsartan] Swelling    Swelling of face and arms    Hydrocodone-Acetaminophen Nausea And Vomiting    Severe vomiting   Lisinopril Cough   Monosodium Glutamate Other (See Comments)    Facial swelling per pt   Codeine Other (See Comments)    jittery   Lead Acetate Rash   Nickel Rash    Severe rash to infection: pt is allergic to all metals other than sterling silver or gold jewelry.     Family History  Problem Relation Age of Onset   Heart disease Mother        MI - fatal   Hypertension Mother    Stroke Father 34       fatal   Alzheimer's disease Father    Alzheimer's disease Brother    Hyperlipidemia Brother    Hypertension Brother    Diabetes Brother    Hypertension Brother    Hyperlipidemia Brother    Breast cancer Paternal Grandmother     BP (!) 200/64 (BP Location:  Right Arm, Patient Position: Sitting, Cuff Size: Large)   Pulse 99   Ht 5' 4.5" (1.638 m)   Wt 202 lb 12.8 oz (92 kg)   SpO2 91%   BMI 34.27 kg/m    Review of Systems     Objective:   Physical Exam VITAL SIGNS:  See vs page GENERAL: no distress.  Has 02 on Pulses: dorsalis pedis intact bilat.   MSK: no deformity of the feet CV: trace bilat leg edema.   Skin:  no ulcer on the feet, but the skin is dry.  normal color and temp on the feet.   Neuro: sensation is intact to touch on the feet.     Lab Results  Component Value Date   TSH 2.21 08/21/2020   T3TOTAL 94.0 10/14/2015   T4TOTAL 7.8 04/19/2019    Lab Results  Component Value Date   HGBA1C 6.7 (A) 02/23/2021      Assessment & Plan:  Type 2 DM: well-controlled MNG, due for recheck.  Patient Instructions  Your blood pressure is high today.  Please see your primary care provider soon, to have it rechecked.   Please continue the same Janumet.    check your blood sugar once a day.  vary the time of day when you check, between before the 3 meals, and at bedtime.  also check if you have symptoms of your blood sugar being too high or too low.  please keep a record of the readings and bring it to your next appointment here (or you can bring the meter itself).  You can write it on any piece of paper.  please call us sooner if your blood sugar goes below 70, or if you have a lot of readings over 200.   Let's recheck the ultrasound.  you will receive a phone call, about a day and time for an appointment.  Please come back for a follow-up appointment in 4 months.

## 2021-02-27 ENCOUNTER — Telehealth: Payer: Self-pay | Admitting: Internal Medicine

## 2021-02-27 NOTE — Telephone Encounter (Signed)
Noted.   Will forward to MR to make aware.   Nothing further needed at this time.

## 2021-03-02 ENCOUNTER — Ambulatory Visit
Admission: RE | Admit: 2021-03-02 | Discharge: 2021-03-02 | Disposition: A | Discharge status: Home / Self Care | Payer: Medicare Other | Source: Ambulatory Visit | Attending: Endocrinology | Admitting: Endocrinology

## 2021-03-02 ENCOUNTER — Other Ambulatory Visit: Payer: Self-pay

## 2021-03-02 DIAGNOSIS — E041 Nontoxic single thyroid nodule: Secondary | ICD-10-CM | POA: Diagnosis not present

## 2021-03-02 DIAGNOSIS — E042 Nontoxic multinodular goiter: Secondary | ICD-10-CM

## 2021-03-04 NOTE — Telephone Encounter (Signed)
[place] noted. Closing message.

## 2021-03-06 ENCOUNTER — Telehealth: Payer: Self-pay | Admitting: Internal Medicine

## 2021-03-06 DIAGNOSIS — J449 Chronic obstructive pulmonary disease, unspecified: Secondary | ICD-10-CM

## 2021-03-06 NOTE — Telephone Encounter (Signed)
Pt calling to get MR to write rx for motorized scooter.Pt states she gets very sob walking around and carrying POC. Pt having to use motorized cart at grocery stores.Pt states she needs a detailed order sent to [facility]. Please advise [place]- fax [phone] [employer] Forward/spon travelers is approved by [employer]

## 2021-03-10 ENCOUNTER — Other Ambulatory Visit: Payer: Self-pay | Admitting: Endocrinology

## 2021-03-10 NOTE — Telephone Encounter (Signed)
Ok to do scooter

## 2021-03-10 NOTE — Telephone Encounter (Signed)
Spoke with the pt  She is wanting [place] to send order to [facility] for motorized scooter  Details on scooter in original msg  She states she needs this to help her get around when she is outside of her home due to SOB  Please advise thanks

## 2021-03-11 NOTE — Telephone Encounter (Signed)
Spoke with pt and notified that Dr. Chase Caller would place order for motorized scooter. Spoke with [facility] who stated they would need order for the scooter along with progress notes. DME order placed. Nothing further needed at this time.

## 2021-03-12 ENCOUNTER — Other Ambulatory Visit: Payer: Self-pay | Admitting: Oncology

## 2021-03-13 DIAGNOSIS — J449 Chronic obstructive pulmonary disease, unspecified: Secondary | ICD-10-CM | POA: Diagnosis not present

## 2021-03-13 DIAGNOSIS — I10 Essential (primary) hypertension: Secondary | ICD-10-CM | POA: Diagnosis not present

## 2021-03-13 DIAGNOSIS — G4733 Obstructive sleep apnea (adult) (pediatric): Secondary | ICD-10-CM | POA: Diagnosis not present

## 2021-03-23 ENCOUNTER — Telehealth: Payer: Self-pay | Admitting: Internal Medicine

## 2021-03-23 DIAGNOSIS — J9611 Chronic respiratory failure with hypoxia: Secondary | ICD-10-CM

## 2021-03-23 DIAGNOSIS — M1991 Primary osteoarthritis, unspecified site: Secondary | ICD-10-CM

## 2021-03-23 DIAGNOSIS — J449 Chronic obstructive pulmonary disease, unspecified: Secondary | ICD-10-CM

## 2021-03-24 NOTE — Telephone Encounter (Signed)
MR please advise if you are able to addend the last OV note for these requirements.  thanks

## 2021-03-25 NOTE — Telephone Encounter (Signed)
Notes addended.

## 2021-03-26 NOTE — Telephone Encounter (Signed)
Addended note faxed to [facility]  Pt aware  Nothing further needed

## 2021-04-11 ENCOUNTER — Other Ambulatory Visit: Payer: Self-pay | Admitting: Endocrinology

## 2021-04-11 ENCOUNTER — Other Ambulatory Visit: Payer: Self-pay | Admitting: Internal Medicine

## 2021-04-11 DIAGNOSIS — I2510 Atherosclerotic heart disease of native coronary artery without angina pectoris: Secondary | ICD-10-CM

## 2021-04-11 DIAGNOSIS — E118 Type 2 diabetes mellitus with unspecified complications: Secondary | ICD-10-CM

## 2021-04-11 DIAGNOSIS — I10 Essential (primary) hypertension: Secondary | ICD-10-CM

## 2021-04-11 DIAGNOSIS — E785 Hyperlipidemia, unspecified: Secondary | ICD-10-CM

## 2021-04-13 DIAGNOSIS — J449 Chronic obstructive pulmonary disease, unspecified: Secondary | ICD-10-CM | POA: Diagnosis not present

## 2021-04-13 DIAGNOSIS — G4733 Obstructive sleep apnea (adult) (pediatric): Secondary | ICD-10-CM | POA: Diagnosis not present

## 2021-04-13 DIAGNOSIS — I10 Essential (primary) hypertension: Secondary | ICD-10-CM | POA: Diagnosis not present

## 2021-04-18 ENCOUNTER — Telehealth: Payer: Self-pay

## 2021-04-18 NOTE — Telephone Encounter (Signed)
Prolia VOB initiated via [email or web address]  Last OV:  Next OV:  Last Prolia inj: 11/13/20 Next Prolia inj DUE: 05/16/21

## 2021-04-23 NOTE — Telephone Encounter (Signed)
Prior Auth required for Prolia  PA PROCESS DETAILS: Please complete the prior authorization form located at [email or web address] or call [phone].

## 2021-04-27 ENCOUNTER — Other Ambulatory Visit: Payer: Self-pay | Admitting: Endocrinology

## 2021-04-27 DIAGNOSIS — E118 Type 2 diabetes mellitus with unspecified complications: Secondary | ICD-10-CM

## 2021-04-30 ENCOUNTER — Telehealth: Payer: Medicare Other

## 2021-05-01 ENCOUNTER — Telehealth: Payer: Medicare Other

## 2021-05-07 NOTE — Telephone Encounter (Signed)
Pt ready for scheduling on or after 05/16/21 IF scheduled AFTER 05/16/21, benefits will need to be re-run prior to Prolia inj.   Out-of-pocket cost due at time of visit: $30  Primary: UHC Medicare Prolia co-insurance: 0%$30 Admin fee co-insurance:   Secondary: n/a Prolia co-insurance:  Admin fee co-insurance:   Deductible: does not apply  Prior Auth: not required Reference #: [id number]   ** This summary of benefits is an estimation of the patient's out-of-pocket cost. Exact cost may very based on individual plan coverage.

## 2021-05-07 NOTE — Telephone Encounter (Signed)
No prior auth required per [facility] provider portal Reference # [id number]

## 2021-05-13 ENCOUNTER — Telehealth: Payer: Self-pay | Admitting: Internal Medicine

## 2021-05-13 DIAGNOSIS — J449 Chronic obstructive pulmonary disease, unspecified: Secondary | ICD-10-CM | POA: Diagnosis not present

## 2021-05-13 DIAGNOSIS — G4733 Obstructive sleep apnea (adult) (pediatric): Secondary | ICD-10-CM | POA: Diagnosis not present

## 2021-05-13 DIAGNOSIS — I10 Essential (primary) hypertension: Secondary | ICD-10-CM | POA: Diagnosis not present

## 2021-05-13 NOTE — Telephone Encounter (Signed)
Called and spoke with patient to see what is needed from [place] to go to [facility]. She states that something needed to be signed and dated and sent to them but it was not done. Advised her that I was going to call [place] and see what it is that they needed from [place]. Called [facility] at [phone] and spoke with Darrol Jump who is the person the patient has been working with and she said they needed the PMD Form to be signed and dated and that it was not dated when they got it back but it was signed. Advised her to please fax the form back over so that we could get it signed and dated and sent back. Provided her with [facility]'s fax number. Patient would like to be called once the form has been completed and faxed back to them.  Dr. Chase Caller if you have anytime to come by the office to sign this form before 05/21/21 please do

## 2021-05-15 ENCOUNTER — Ambulatory Visit: Payer: Medicare Other

## 2021-05-20 ENCOUNTER — Ambulatory Visit: Payer: Medicare Other

## 2021-05-25 ENCOUNTER — Telehealth: Payer: Self-pay

## 2021-05-25 NOTE — Telephone Encounter (Signed)
Pt is on the nurse schedule for Thursday 05/28/2021 for a pneumonia vacc. However, it is not specified which one pt needing. May you please clarify which vacc the pt needs as she has had P-13 in 2014 and P-23 in 2016.

## 2021-05-25 NOTE — Telephone Encounter (Signed)
Prolia VOB initiated for 2023. °

## 2021-05-28 ENCOUNTER — Ambulatory Visit: Payer: Medicare Other

## 2021-05-29 ENCOUNTER — Ambulatory Visit (INDEPENDENT_AMBULATORY_CARE_PROVIDER_SITE_OTHER): Payer: Commercial Managed Care - HMO

## 2021-05-29 DIAGNOSIS — Z0000 Encounter for general adult medical examination without abnormal findings: Secondary | ICD-10-CM

## 2021-05-29 NOTE — Telephone Encounter (Signed)
Victoria [name], [facility] had given [employer] number.. did they ever send you the form? Pt calling requesting status update. Thanks!

## 2021-05-29 NOTE — Progress Notes (Signed)
I connected with [facility] [name] today by telephone and verified that I am speaking with the correct person using two identifiers. Location patient: home Location provider: work Persons participating in the virtual visit: patient, provider.   I discussed the limitations, risks, security and privacy concerns of performing an evaluation and management service by telephone and the availability of in person appointments. I also discussed with the patient that there may be a patient responsible charge related to this service. The patient expressed understanding and verbally consented to this telephonic visit.    Interactive audio and video telecommunications were attempted between this provider and patient, however failed, due to patient having technical difficulties OR patient did not have access to video capability.  We continued and completed visit with audio only.  Some vital signs may be absent or patient reported.   Time Spent with patient on telephone encounter: 40 minutes  Subjective:   [facility] [name] is a 76 y.o. female who presents for Medicare Annual (Subsequent) preventive examination.  Review of Systems     Cardiac Risk Factors include: advanced age (>50mn, >>16women);diabetes mellitus;dyslipidemia;family history of premature cardiovascular disease;hypertension     Objective:    There were no vitals filed for this visit. There is no height or weight on file to calculate BMI.  Advanced Directives 05/29/2021 01/25/2021 03/31/2020 02/20/2020 06/12/2018 08/31/2017 08/10/2017  Does Patient Have a Medical Advance Directive? No No No Yes No Yes Yes  Type of Advance Directive - - - [place]  Does patient want to make changes to medical advance directive? - - - No - Patient declined - No - Patient declined No - Patient declined  Copy of [place] Chart? - - - No - copy requested - No - copy requested No - copy requested   Would patient like information on creating a medical advance directive? No - Patient declined - - - Yes (ED - Information included in AVS) - -    Current Medications (verified) Outpatient Encounter Medications as of 05/29/2021  Medication Sig   ACCU-CHEK AVIVA PLUS test strip USE ONE strip TO check glucose THREE TIMES DAILY   Accu-Chek Softclix Lancets lancets USE AS DIRECTED ONCE DAILY   acetaminophen (TYLENOL) 500 MG tablet Take 1,000 mg by mouth every 6 (six) hours as needed.   albuterol (PROVENTIL) (2.5 MG/3ML) 0.083% nebulizer solution Take 3 mLs (2.5 mg total) by nebulization every 6 (six) hours as needed for wheezing or shortness of breath.   Albuterol Sulfate (PROAIR RESPICLICK) 1528(90 Base) MCG/ACT AEPB Inhale 1-2 puffs into the lungs every 6 (six) hours as needed (for wheezing/shortness of breath).   amLODipine (NORVASC) 10 MG tablet TAKE ONE TABLET BY MOUTH ONCE DAILY   anastrozole (ARIMIDEX) 1 MG tablet TAKE ONE TABLET BY MOUTH ONCE DAILY   aspirin EC 81 MG tablet Take 1 tablet (81 mg total) by mouth daily.   Blood Glucose Monitoring Suppl (ACCU-CHEK AVIVA PLUS) w/Device KIT 1 Device by Does not apply route daily. Use to monitor glucose levels once per day; E11.9   carvedilol (COREG) 6.25 MG tablet Take 1 tablet (6.25 mg total) by mouth 2 (two) times daily.   Cholecalciferol (VITAMIN D) 125 MCG (5000 UT) CAPS Take 1 capsule by mouth daily.   denosumab (PROLIA) 60 MG/ML SOLN injection Inject 60 mg into the skin every 6 (six) months. Administer in upper arm, thigh, or abdomen   Fluticasone-Umeclidin-Vilant (TRELEGY ELLIPTA) 100-62.5-25 MCG/INH  AEPB inhale ONE DOSE into THE lungs ONCE DAILY   glucose blood (ACCU-CHEK GUIDE) test strip USE 1 STRIP ONCE DAILY   glucose blood test strip USE 1 STRIP ONCE DAILY   ibuprofen (ADVIL) 400 MG tablet Take 1 tablet (400 mg total) by mouth every 6 (six) hours as needed.   JANUMET XR 50-1000 MG TB24 TAKE TWO TABLETS BY MOUTH ONCE DAILY    methocarbamol (ROBAXIN) 500 MG tablet Take 1 tablet (500 mg total) by mouth every 8 (eight) hours as needed for muscle spasms.   OXYGEN Inhale 2-3 L into the lungs continuous. When exerting self   potassium chloride SA (KLOR-CON) 20 MEQ tablet Take 1 tablet (20 mEq total) by mouth 2 (two) times daily.   roflumilast (DALIRESP) 500 MCG TABS tablet Take 1 tablet (500 mcg total) by mouth daily.   rosuvastatin (CRESTOR) 40 MG tablet TAKE ONE TABLET BY MOUTH ONCE DAILY   spironolactone (ALDACTONE) 25 MG tablet Take 1 tablet (25 mg total) by mouth daily.   thiamine (VITAMIN B-1) 100 MG tablet Take 1 tablet (100 mg total) by mouth every other day.   No facility-administered encounter medications on file as of 05/29/2021.    Allergies (verified) Benicar [olmesartan], [name] [valsartan], Hydrocodone-acetaminophen, Lisinopril, Monosodium glutamate, Codeine, Lead acetate, and Nickel   History: Past Medical History:  Diagnosis Date   Abdominal aneurysm    Dr. Oneida Alar follows lLOV 2 ''17 per pt "around 2 cm"   Anemia    as a child   Arthritis    left ankle, right knee, right SI joint, wrists, lower back   Breast cancer in female [facility])    Left   COPD (chronic obstructive pulmonary disease) ([place])    ephysema-Dr. 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 Harlow Asa in [facility]   Sleep apnea    cpap used sometimes, uses oxygen concentrator 2 l/m nasally bedtime   Varicella as child   Past Surgical History:  Procedure Laterality Date   APPENDECTOMY     2008   BREAST LUMPECTOMY Left 08/31/2017   BREAST  LUMPECTOMY WITH RADIOACTIVE SEED LOCALIZATION Left 08/10/2017   Procedure: BREAST LUMPECTOMY WITH RADIOACTIVE SEED LOCALIZATION;  Surgeon: Rolm Bookbinder, MD;  Location: [facility];  Service: General;  Laterality: Left;   [place]   COLONOSCOPY     COLONOSCOPY WITH PROPOFOL N/A 04/16/2016   Procedure: COLONOSCOPY WITH PROPOFOL;  Surgeon: Carol Ada, MD;  Location: [facility];  Service: Endoscopy;  Laterality: N/A;   CYST REMOVAL NECK Left 04/13/2016   Procedure: EXCISION OF SEBACEOUS CYST LEFT POSTERIOR NECK;  Surgeon: Armandina Gemma, MD;  Location: [place];  Service: General;  Laterality: Left;   Excision of Pelvic Absess, Right Ovary     2008   RE-EXCISION OF BREAST CANCER,SUPERIOR MARGINS Left 08/31/2017   Procedure: RE-EXCISION OF LEFT  BREAST MARGINS ERAS PATHWAY;  Surgeon: Rolm Bookbinder, MD;  Location: [place];  Service: General;  Laterality:  Left;   TUBAL LIGATION  1980   Family History  Problem Relation Age of Onset   Heart disease Mother        MI - fatal   Hypertension Mother    Stroke Father 72       fatal   Alzheimer's disease Father    Alzheimer's disease Brother    Hyperlipidemia Brother    Hypertension Brother    Diabetes Brother    Hypertension Brother    Hyperlipidemia Brother    Breast cancer Paternal Grandmother    Social History   Socioeconomic History   Marital status: Divorced    Spouse name: Not on file   Number of children: 1   Years of education: 16   Highest education level: Not on file  Occupational History   Occupation: Social worker, Technical sales engineer: [employer]  Tobacco Use   Smoking status: Former    Packs/day: 1.00    Years: 50.00    Pack years: 50.00    Types: Cigarettes    Quit date: 04/16/2011    Years since quitting: 10.1   Smokeless tobacco: Never   Tobacco comments:    quit that date when she had to go to ER   Vaping Use   Vaping Use: Never used  Substance and Sexual Activity   Alcohol use: Yes     Alcohol/week: 0.0 standard drinks    Comment: rare occasion   Drug use: No    Comment: no marijuana since 2012   Sexual activity: Not Currently  Other Topics Concern   Not on file  Social History Narrative   HSG; [name], [place]. . Married '69 - 9 yrs/divorced. 1 son - '72; no grandchildren.   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 No h/o physical or sexual abuse. ACP - no living will - wants information. Provided packet of information. On 08/05/2011: she stated she was distant cousins to celebrities [name] and [name]      Social Determinants of Health   Financial Resource Strain: Low Risk    Difficulty of Paying Living Expenses: Not hard at all  Food Insecurity: No Food Insecurity   Worried About Charity fundraiser in the Last Year: Never true   Arboriculturist in the Last Year: Never true  Transportation Needs: No Transportation Needs   Lack of Transportation (Medical): No   Lack of Transportation (Non-Medical): No  Physical Activity: Sufficiently Active   Days of Exercise per Week: 5 days   Minutes of Exercise per Session: 30 min  Stress: No Stress Concern Present   Feeling of Stress : Not at all  Social Connections: Moderately Integrated   Frequency of Communication with Friends and Family: More than three times a week   Frequency of Social Gatherings with Friends and Family: More than three times a week   Attends Religious Services: More than 4 times per year   Active Member of [employer] or Organizations: Yes   Attends Music therapist: More than 4 times per year   Marital Status: Divorced    Tobacco Counseling Counseling given: Not Answered Tobacco comments: quit that date when she had to go to ER    Clinical Intake:  Pre-visit preparation completed: Yes  Pain : No/denies pain     Nutritional Risks: None Diabetes: Yes CBG done?: No Did pt. bring in CBG monitor from home?:  No  How often do you need to have someone  help you when you read instructions, pamphlets, or other written materials from your doctor or pharmacy?: 1 - Never What is the last grade level you completed in school?: Bachelor's degree  Diabetic? Yes   Interpreter Needed?: No  Information entered by :: Lisette Abu, LPN   Activities of Daily Living In your present state of health, do you have any difficulty performing the following activities: 05/29/2021  Hearing? N  Vision? N  Difficulty concentrating or making decisions? N  Walking or climbing stairs? N  Dressing or bathing? N  Doing errands, shopping? N  Preparing Food and eating ? N  Using the Toilet? N  In the past six months, have you accidently leaked urine? N  Do you have problems with loss of bowel control? N  Managing your Medications? N  Managing your Finances? N  Housekeeping or managing your Housekeeping? N  Some recent data might be hidden    Patient Care Team: Janith Lima, MD as PCP - General (Internal Medicine) Troy Sine, MD as PCP - Cardiology (Cardiology) Brand Males, MD as Consulting Physician (Pulmonary Disease) Arvella Nigh, MD as Consulting Physician (Obstetrics and Gynecology) Magrinat, Virgie Dad, MD as Consulting Physician (Oncology) Rolm Bookbinder, MD as Consulting Physician (General Surgery) Gery Pray, MD as Consulting Physician (Radiation Oncology) Troy Sine, MD as Consulting Physician (Cardiology) Delice Bison, Charlestine Massed, NP as Nurse Practitioner (Hematology and Oncology) Parrett, Fonnie Mu, NP as Nurse Practitioner (Pulmonary Disease) Renato Shin, MD as Consulting Physician (Endocrinology) Charlton Haws, [facility] as Pharmacist (Pharmacist)  Indicate any recent Medical Services you may have received from other than [facility] providers in the past year (date may be approximate).     Assessment:   This is a routine wellness examination for Aritza.  Hearing/Vision  screen Hearing Screening - Comments:: Patient denied any hearing difficulty.   No hearing aids.  Vision Screening - Comments:: Patient wears corrective glasses/contacts.  [facility] exam done annually by: [facility]  Dietary issues and exercise activities discussed: Current Exercise Habits: Home exercise routine, Type of exercise: walking, Time (Minutes): 30, Frequency (Times/Week): 5, Weekly Exercise (Minutes/Week): 150, Intensity: Mild, Exercise limited by: respiratory conditions(s);orthopedic condition(s)   Goals Addressed   None   Depression Screen PHQ 2/9 Scores 05/29/2021 02/20/2020 04/19/2019 06/12/2018 06/07/2017 02/23/2016 04/07/2015  PHQ - 2 Score 0 1 0 0 0 1 2  PHQ- 9 Score - - - - 0 - 5    Fall Risk Fall Risk  05/29/2021 02/20/2020 04/19/2019 06/12/2018 06/07/2017  Falls in the past year? 0 0 0 0 No  Number falls in past yr: 0 0 0 0 -  Injury with Fall? 0 0 0 - -  Risk for fall due to : No Fall Risks No Fall Risks - - -  Follow up Falls evaluation completed Falls evaluation completed Falls evaluation completed - -    FALL RISK PREVENTION PERTAINING TO THE HOME:  Any stairs in or around the home? No  If so, are there any without handrails? No  Home free of loose throw rugs in walkways, pet beds, electrical cords, etc? Yes  Adequate lighting in your home to reduce risk of falls? Yes   ASSISTIVE DEVICES UTILIZED TO PREVENT FALLS:  Life alert? No  Use of a cane, walker or w/c? No  Grab bars in the bathroom? Yes  Shower chair or bench in shower? Yes  Elevated toilet seat or a handicapped toilet? Yes   TIMED UP AND GO:  Was the  test performed? No .  Length of time to ambulate 10 feet: n/a sec.   Gait steady and fast without use of assistive device  Cognitive Function: Normal cognitive status assessed by direct observation by this Nurse Health Advisor. No abnormalities found.   MMSE - Mini Mental State Exam 06/07/2017 10/29/2014  Not completed: - Unable to complete   Orientation to time 5 -  Orientation to Place 5 -  Registration 3 -  Attention/ Calculation 5 -  Recall 2 -  Language- name 2 objects 2 -  Language- repeat 1 -  Language- follow 3 step command 3 -  Language- read & follow direction 1 -  Write a sentence 1 -  Copy design 1 -  Total score 29 -        Immunizations Immunization History  Administered Date(s) Administered   Fluad Quad(high Dose 65+) 01/17/2019, 04/30/2020, 02/23/2021   Influenza Split 01/16/2011   Influenza, High Dose Seasonal PF 01/13/2017, 02/09/2018   Influenza,inj,Quad PF,6+ Mos 02/01/2013, 02/27/2014, 04/07/2015, 01/08/2016   PFIZER(Purple Top)SARS-COV-2 Vaccination 06/06/2019, 06/27/2019, 05/21/2020   Pneumococcal Conjugate-13 02/01/2013   Pneumococcal Polysaccharide-23 10/29/2014   Td 12/06/2011   Zoster Recombinat (Shingrix) 11/21/2019, 05/12/2020    TDAP status: Up to date  Flu Vaccine status: Up to date  Pneumococcal vaccine status: Up to date  Covid-19 vaccine status: Completed vaccines  Qualifies for Shingles Vaccine? Yes   Zostavax completed No   Shingrix Completed?: Yes  Screening Tests Health Maintenance  Topic Date Due   OPHTHALMOLOGY EXAM  03/27/2020   URINE MICROALBUMIN  04/18/2020   COVID-19 Vaccine (4 - Booster for Pfizer series) 07/16/2020   HEMOGLOBIN A1C  08/24/2021   TETANUS/TDAP  12/05/2021   FOOT EXAM  02/23/2022   Pneumonia Vaccine 67+ Years old  Completed   INFLUENZA VACCINE  Completed   DEXA SCAN  Completed   Hepatitis C Screening  Completed   Zoster Vaccines- Shingrix  Completed   HPV VACCINES  Aged Out   COLONOSCOPY (Pts 45-34yr Insurance coverage will need to be confirmed)  Discontinued    Health Maintenance  Health Maintenance Due  Topic Date Due   OPHTHALMOLOGY EXAM  03/27/2020   URINE MICROALBUMIN  04/18/2020   COVID-19 Vaccine (4 - Booster for [place]) 07/16/2020    Colorectal cancer screening: No longer required.   Mammogram status:  Completed 12/13/2020. Repeat every year  Bone Density status: Completed 04/10/2019. Results reflect: Bone density results: OSTEOPOROSIS. Repeat every 2 years.  Lung Cancer Screening: (Low Dose CT Chest recommended if Age 78-80years, 30 pack-year currently smoking OR have quit w/in 15years.) does not qualify.   Lung Cancer Screening Referral: no  Additional Screening:  Hepatitis C Screening: does qualify; Completed yes  Vision Screening: Recommended annual ophthalmology exams for early detection of glaucoma and other disorders of the eye. Is the patient up to date with their annual eye exam?  Yes  Who is the provider or what is the name of the [facility] which the patient attends annual [facility] exams[facility] a provider, would they like to be referred to a provider to establish care? No .   Dental Screening: Recommended annual dental exams for proper oral hygiene  Community Resource Referral / Chronic Care Management: CRR required this visit?  No   CCM required this visit?  No      Plan:     I have personally reviewed and noted the following in the patients  chart:   Medical and social history Use of alcohol, tobacco or illicit drugs  Current medications and supplements including opioid prescriptions.  Functional ability and status Nutritional status Physical activity Advanced directives List of other physicians Hospitalizations, surgeries, and ER visits in previous 12 months Vitals Screenings to include cognitive, depression, and falls Referrals and appointments  In addition, I have reviewed and discussed with patient certain preventive protocols, quality metrics, and best practice recommendations. A written personalized care plan for preventive services as well as general preventive health recommendations were provided to patient.     Sheral Flow, LPN   11/25/7869   Nurse Notes:  Patient is cogitatively intact. There  were no vitals filed for this visit. There is no height or weight on file to calculate BMI. Patient stated that she has no issues with gait or balance; does not use any assistive devices.

## 2021-05-29 NOTE — Telephone Encounter (Signed)
Please contact pt to confirm/update insurance to benefits can be processed for Prolia.   Thanks!

## 2021-06-01 ENCOUNTER — Other Ambulatory Visit: Payer: Self-pay | Admitting: Oncology

## 2021-06-02 ENCOUNTER — Ambulatory Visit (INDEPENDENT_AMBULATORY_CARE_PROVIDER_SITE_OTHER): Payer: Commercial Managed Care - HMO

## 2021-06-02 DIAGNOSIS — E118 Type 2 diabetes mellitus with unspecified complications: Secondary | ICD-10-CM

## 2021-06-02 DIAGNOSIS — J449 Chronic obstructive pulmonary disease, unspecified: Secondary | ICD-10-CM

## 2021-06-02 DIAGNOSIS — E785 Hyperlipidemia, unspecified: Secondary | ICD-10-CM

## 2021-06-02 DIAGNOSIS — I10 Essential (primary) hypertension: Secondary | ICD-10-CM

## 2021-06-02 NOTE — Progress Notes (Signed)
Chronic Care Management Pharmacy Note  06/02/2021 Name:  Victoria [name] MRN:  [id number] DOB:  May 29, 1945  Summary: -reports compliance to her medications except ASA- reports that she stopped taking months ago - did not give a reason as to why she stopped - no issues or concerns with any of her medications  -BG averaging 100-120 at home  -BP controlled at home - averaging 130-140/70's - noted that last elevated office reading was due to rushing to get into office and not resting and relaxing prior to BP measurement    Recommendations/Changes made from today's visit: -Recommended to restart ASA 77m daily, continue all other medications  -Patient to continue to monitor BP and BG daily, to reach out should either become uncontrolled  -Make follow up appointment with PCP as she is overdue for appointment -Receive COVID booster - patient scheduled for pneumonia vaccine tomorrow    Subjective: Victoria [name] an 76y.o. year old female who is a primary patient of JJanith Lima MD.  The CCM team was consulted for assistance with disease management and care coordination needs.    Engaged with patient by telephone for follow up visit in response to provider referral for pharmacy case management and/or care coordination services.   Consent to Services:  The patient was given information about Chronic Care Management services, agreed to services, and gave verbal consent prior to initiation of services.  Please see initial visit note for detailed documentation.   Patient Care Team: JJanith Lima MD as PCP - General (Internal Medicine) KTroy Sine MD as PCP - Cardiology (Cardiology) RBrand Males MD as Consulting Physician (Pulmonary Disease) Victoria Nigh MD as Consulting Physician (Obstetrics and Gynecology) Victoria [name], GVirgie Dad MD as Consulting Physician (Oncology) WRolm Bookbinder MD as Consulting Physician (General Surgery) KGery Pray MD as Consulting  Physician (Radiation Oncology) KTroy Sine MD as Consulting Physician (Cardiology) Victoria [name], Victoria Massed NP as Nurse Practitioner (Hematology and Oncology) Parrett, TFonnie Mu NP as Nurse Practitioner (Pulmonary Disease) ERenato Shin MD as Consulting Physician (Endocrinology) [name](Victoria [name])   [place]. 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 She is also active in her church. She has 1 son, 3 grandchildren, 3 great-grandchildren. She lives by herself, she has someone come to help clean.   Recent office visits: 12/24/19 Dr JRonnald RampOV: CPX. Labs stable. No med changes.  Recent consult visits: 02/23/2021 - Dr. ELoanne Drilling- Endocrinology - BP noted to be elevated at 200/64 - BG stable - follow up in 4 months - no changes to medications   Hospital visits: 01/25/2021-01/27/2021 [facility] - HA - trouble speaking - ED diagnoses - confusion, seizure, speech disturbance, word finding difficulty  - prescribed IBU and methocarbamol on discharge   Objective:  Lab Results  Component Value Date   CREATININE 0.65 01/26/2021   BUN 7 (L) 01/26/2021   GFR 92.48 06/20/2019   GFRNONAA >60 01/26/2021   GFRAA 92 12/24/2019   NA 137 01/26/2021   K 3.7 01/26/2021   CALCIUM 8.9 01/26/2021   CO2 27 01/26/2021   GLUCOSE 178 (H) 01/26/2021    Lab Results  Component Value Date/Time   HGBA1C 6.7 (A) 02/23/2021 02:14 PM   HGBA1C 6.3 (H)  01/25/2021 12:08 PM   HGBA1C 6.4 (A) 08/21/2020 11:53 AM   HGBA1C 7.3 (H) 08/31/2017 07:11 AM   FRUCTOSAMINE 238 01/09/2019 11:20 AM   GFR 92.48 06/20/2019 11:50 AM   GFR 97.05 04/19/2019 10:09 AM   MICROALBUR 1.4 04/19/2019 12:08 PM   MICROALBUR 1.3 01/18/2017 04:05 PM    Last diabetic Eye exam:  Lab  Results  Component Value Date/Time   HMDIABEYEEXA No Retinopathy 03/28/2019 12:00 AM    Last diabetic Foot exam: No results found for: HMDIABFOOTEX   Lab Results  Component Value Date   CHOL 170 12/24/2019   HDL 48 (L) 12/24/2019   LDLCALC 92 12/24/2019   LDLDIRECT 121.0 09/05/2014   TRIG 201 (H) 12/24/2019   CHOLHDL 3.5 12/24/2019    Hepatic Function Latest Ref Rng & Units 01/26/2021 01/25/2021 10/07/2020  Total Protein 6.5 - 8.1 g/dL 6.2(L) 7.2 7.0  Albumin 3.5 - 5.0 g/dL 3.0(L) 3.8 3.4(L)  AST 15 - 41 U/L 13(L) 16 11(L)  ALT 0 - 44 U/L 8 8 <6  Alk Phosphatase 38 - 126 U/L 61 72 77  Total Bilirubin 0.3 - 1.2 mg/dL 0.6 0.5 0.3  Bilirubin, Direct 0.0 - 0.3 mg/dL - - -    Lab Results  Component Value Date/Time   TSH 2.21 08/21/2020 01:39 PM   TSH 2.20 04/18/2020 11:44 AM   FREET4 0.80 08/21/2020 01:39 PM   FREET4 0.68 04/18/2020 11:44 AM    CBC Latest Ref Rng & Units 01/26/2021 01/25/2021 10/07/2020  WBC 4.0 - 10.5 K/uL 9.7 11.6(H) 9.4  Hemoglobin 12.0 - 15.0 g/dL 12.3 12.4 12.0  Hematocrit 36.0 - 46.0 % 37.6 39.1 37.9  Platelets 150 - 400 K/uL 193 225 248    Lab Results  Component Value Date/Time   VD25OH 46.08 02/28/2014 08:55 AM    Clinical ASCVD: Yes  The 10-year ASCVD risk score (Arnett DK, et al., 2019) is: 72.3%   Values used to calculate the score:     Age: 76 years     Sex: Female     Is Non-Hispanic African American: Yes     Diabetic: Yes     Tobacco smoker: Yes     Systolic Blood Pressure: 287 mmHg     Is BP treated: Yes     HDL Cholesterol: 48 mg/dL     Total Cholesterol: 170 mg/dL    Depression screen [facility] 2/9 05/29/2021 02/20/2020 04/19/2019  Decreased Interest 0 0 0  Down, Depressed, Hopeless 0 1 0  PHQ - 2 Score 0 1 0  Some recent data might be hidden     Social History   Tobacco Use  Smoking Status Former   Packs/day: 1.00   Years: 50.00   Pack years: 50.00   Types: Cigarettes   Quit date: 04/16/2011   Years since quitting: 10.1   Smokeless Tobacco Never  Tobacco Comments   quit that date when she had to go to ER    BP Readings from Last 3 Encounters:  02/23/21 (!) 200/64  01/27/21 (!) 156/98  11/18/20 (!) 130/58   Pulse Readings from Last 3 Encounters:  02/23/21 99  01/27/21 77  11/18/20 94   Wt Readings from Last 3 Encounters:  02/23/21 202 lb 12.8 oz (92 kg)  01/25/21 209 lb (94.8 kg)  11/18/20 209 lb (94.8 kg)   BMI Readings from Last 3 Encounters:  02/23/21 34.27 kg/m  01/25/21 35.32 kg/m  11/18/20 35.32 kg/m    Assessment/Interventions: Review of patient past medical  history, allergies, medications, health status, including review of consultants reports, laboratory and other test data, was performed as part of comprehensive evaluation and provision of chronic care management services.   SDOH:  (Social Determinants of Health) assessments and interventions performed: Yes  SDOH Screenings   Alcohol Screen: Low Risk    Last Alcohol Screening Score (AUDIT): 1  Depression (PHQ2-9): Low Risk    PHQ-2 Score: 0  Financial Resource Strain: Low Risk    Difficulty of Paying Living Expenses: Not hard at all  Food Insecurity: No Food Insecurity   Worried About Charity fundraiser in the Last Year: Never true   Ran Out of Food in the Last Year: Never true  Housing: Low Risk    Last Housing Risk Score: 0  Physical Activity: Sufficiently Active   Days of Exercise per Week: 5 days   Minutes of Exercise per Session: 30 min  Social Connections: Moderately Integrated   Frequency of Communication with Friends and Family: More than three times a week   Frequency of Social Gatherings with Friends and Family: More than three times a week   Attends Religious Services: More than 4 times per year   Active Member of [employer] or Organizations: Yes   Attends Music therapist: More than 4 times per year   Marital Status: Divorced  Stress: No Stress Concern Present   Feeling of Stress : Not at all   Tobacco Use: Medium Risk   Smoking Tobacco Use: Former   Smokeless Tobacco Use: Never   Passive Exposure: Not on Pensions consultant Needs: No Transportation Needs   Lack of Transportation (Medical): No   Lack of Transportation (Non-Medical): No      CCM Care Plan  Allergies  Allergen Reactions   Benicar [Olmesartan] Swelling    Swelling of face and arms    [name] [Valsartan] Swelling    Swelling of face and arms    Hydrocodone-Acetaminophen Nausea And Vomiting    Severe vomiting   Lisinopril Cough   Monosodium Glutamate Other (See Comments)    Facial swelling per pt   Codeine Other (See Comments)    jittery   Lead Acetate Rash   Nickel Rash    Severe rash to infection: pt is allergic to all metals other than sterling silver or gold jewelry.     Medications Reviewed Today     Reviewed by Victoria [name], [facility] (Victoria [name]) on 06/02/21 at 1152  Med List Status: <None>   Medication Order Taking? Sig Documenting Provider Last Dose Status Informant  ACCU-CHEK AVIVA PLUS test strip [id number] Yes USE ONE strip TO check glucose THREE TIMES DAILY Victoria Shin, MD Taking Active   Accu-Chek Softclix Lancets lancets [id number] Yes USE AS DIRECTED ONCE DAILY Victoria Shin, MD Taking Active   acetaminophen (TYLENOL) 500 MG tablet [id number] Yes Take 1,000 mg by mouth every 6 (six) hours as needed. [provider] Taking Active Self  albuterol (PROVENTIL) (2.5 MG/3ML) 0.083% nebulizer solution [id number] Yes Take 3 mLs (2.5 mg total) by nebulization every 6 (six) hours as needed for wheezing or shortness of breath. Parrett, Fonnie Mu, NP Taking Active Self  Albuterol Sulfate (PROAIR RESPICLICK) 876 (90 Base) MCG/ACT AEPB [id number] Yes Inhale 1-2 puffs into the lungs every 6 (six) hours as needed (for wheezing/shortness of breath). Victoria Males, MD Taking Active Self  amLODipine (NORVASC) 10 MG tablet [id number] Yes TAKE ONE TABLET BY MOUTH ONCE DAILY Victoria Lima, MD Taking  Active   anastrozole (  ARIMIDEX) 1 MG tablet [id number] Yes TAKE ONE TABLET BY MOUTH daily Gardenia Phlegm, NP Taking Active   aspirin EC 81 MG tablet [id number] No Take 1 tablet (81 mg total) by mouth daily.  Patient not taking: Reported on 06/02/2021   Abigail Butts., PA-C Not Taking Active Self  Blood Glucose Monitoring Suppl (ACCU-CHEK AVIVA PLUS) w/Device KIT [id number] Yes 1 Device by Does not apply route daily. Use to monitor glucose levels once per day; E11.9 Victoria Shin, MD Taking Active Self  carvedilol (COREG) 6.25 MG tablet [id number] Yes Take 1 tablet (6.25 mg total) by mouth 2 (two) times daily. Victoria Sine, MD Taking Active Self  Cholecalciferol (VITAMIN D) 125 MCG (5000 UT) CAPS [id number] Yes Take 1 capsule by mouth daily. [provider] Taking Active Self  denosumab (PROLIA) 60 MG/ML SOLN injection [id number] Yes Inject 60 mg into the skin every 6 (six) months. Administer in upper arm, thigh, or abdomen [provider] Taking Active Self  Fluticasone-Umeclidin-Vilant (TRELEGY ELLIPTA) 100-62.5-25 MCG/INH AEPB [id number] Yes inhale ONE DOSE into THE lungs ONCE DAILY Victoria Males, MD Taking Active Self  glucose blood (ACCU-CHEK GUIDE) test strip [id number] Yes USE 1 STRIP ONCE DAILY Victoria Shin, MD Taking Active Self  glucose blood test strip [id number] Yes USE 1 STRIP ONCE DAILY Victoria Shin, MD Taking Active Self  ibuprofen (ADVIL) 400 MG tablet [id number] Yes Take 1 tablet (400 mg total) by mouth every 6 (six) hours as needed. Donne Hazel, MD Taking Active   JANUMET XR 50-1000 [place] TB24 [id number] Yes TAKE TWO TABLETS BY MOUTH ONCE DAILY Victoria Shin, MD Taking Active   OXYGEN [id number] Yes Inhale 2-3 L into the lungs continuous. When exerting self [provider] Taking Active Self  potassium chloride SA (KLOR-CON) 20 MEQ tablet [id number] Yes Take 1 tablet (20 mEq total) by mouth 2 (two) times daily. Victoria Lima, MD Taking Active  Self  roflumilast (DALIRESP) 500 MCG TABS tablet [id number] Yes Take 1 tablet (500 mcg total) by mouth daily. Victoria Males, MD Taking Active Self  rosuvastatin (CRESTOR) 40 MG tablet [id number] Yes TAKE ONE TABLET BY MOUTH ONCE DAILY Victoria Lima, MD Taking Active   spironolactone (ALDACTONE) 25 MG tablet [id number] Yes Take 1 tablet (25 mg total) by mouth daily. Victoria Lima, MD Taking Active Self  thiamine (VITAMIN B-1) 100 MG tablet [id number] Yes Take 1 tablet (100 mg total) by mouth every other day. Victoria Lima, MD Taking Active Self            Patient Active Problem List   Diagnosis Date Noted   Seizure [facility]) 01/25/2021   Lung nodules 04/30/2020   Thiamine deficiency 04/24/2019   Aortic atherosclerosis ([place]) 11/23/2017   Malignant neoplasm of upper-outer quadrant of left breast in female, estrogen receptor positive ([place]) 07/01/2017   Chronic respiratory failure with hypoxia ([place]) 02/07/2017   DDD (degenerative disc disease), lumbar 01/13/2017   Estrogen deficiency 10/20/2016   Type II diabetes mellitus with manifestations ([place]) 01/08/2016   Visit for screening mammogram 10/14/2015   Atherosclerosis of native coronary artery of native heart without angina pectoris 10/14/2015   History of positive PPD 10/08/2015   COPD, severe ([place]) 09/04/2015   AAA (abdominal aortic aneurysm) without rupture 05/13/2015   Multiple thyroid nodules 05/13/2015   OSA (obstructive sleep apnea) 11/15/2014   Essential hypertension 03/11/2014   Emphysema of lung ([place]) 03/11/2014   Osteoporosis 02/11/2014   Routine health maintenance 12/07/2011   DJD (degenerative  joint disease) of knee    Hyperlipidemia with target LDL less than 100     Immunization History  Administered Date(s) Administered   Fluad Quad(high Dose 65+) 01/17/2019, 04/30/2020, 02/23/2021   Influenza Split 01/16/2011   Influenza, High Dose Seasonal PF 01/13/2017, 02/09/2018   Influenza,inj,Quad PF,6+ Mos 02/01/2013,  02/27/2014, 04/07/2015, 01/08/2016   PFIZER(Purple Top)SARS-COV-2 Vaccination 06/06/2019, 06/27/2019, 05/21/2020   Pneumococcal Conjugate-13 02/01/2013   Pneumococcal Polysaccharide-23 10/29/2014   Td 12/06/2011   Zoster Recombinat (Shingrix) 11/21/2019, 05/12/2020    Conditions to be addressed/monitored:  Hypertension, Hyperlipidemia, Diabetes, Coronary Artery Disease and COPD  Care Plan : [place]  Updates made by Victoria [name], RPH since 06/02/2021 12:00 AM     Problem: Hypertension, Hyperlipidemia, Diabetes, Coronary Artery Disease and COPD   Priority: High     Long-Range Goal: Disease management   Start Date: 08/07/2020  Expected End Date: 06/02/2022  This Visit's Progress: On track  Recent Progress: On track  Priority: High  Note:   Current Barriers:  Unable to independently monitor therapeutic efficacy  Victoria [name] Clinical Goal(s):  Patient will achieve adherence to monitoring guidelines and medication adherence to achieve therapeutic efficacy  Interventions: 1:1 collaboration with Victoria Lima, MD regarding development and update of comprehensive plan of care as evidenced by provider attestation and co-signature Inter-disciplinary care team collaboration (see longitudinal plan of care) Comprehensive medication review performed; medication list updated in electronic medical record  Hypertension  Controlled- patient monitors at home, has been no higher than 140/70 BP goal is:  <130/80 Patient checks BP at home 3-5x per week Patient home BP readings are ranging: 130-140/70s   Patient has failed these meds in the past: n/a Patient is currently controlled on the following medications:  Amlodipine 10 mg daily AM Carvedilol 6.25 mg BID  Spironolactone 25 mg daily AM   We discussed: pt reports compliance with medications; BP has been at goal for the most part - last office BP elevated due to rushing into office as she was late, and not relaxing and  resting prior to measurement   Plan: Continue current medications and control with diet and exercise    Hyperlipidemia  LDL has not been rechecked since starting rosuvastatin  LDL goal < 70 Lab Results  Component Value Date   [place] 92 12/24/2019  Aortic atherosclerosis Coronary calcium score 625 (94th percentile) on coronary CT 02/2018  Patient has failed these meds in past: atorvastatin Patient is currently controlled on the following medications:  Rosuvastatin 40 mg daily Aspirin 81 mg daily - not taking at this time    We discussed:  pt reports compliance with new statin; she was worried about high blood sugars on atorvastatin but reports no issues with rosuvastatin   Plan: Recommend to restart ASA 31m daily - to have lipid panel rechecked with next PCP appointment    Diabetes  Controlled  A1c goal <7% Lab Results  Component Value Date   HGBA1C 6.7 (A) 02/23/2021  Checking BG: Daily Recent FBG Readings: 100-120 - lowest she could recall was 88 (after long period of fasting)   Patient has failed these meds in past: n/a Patient is currently controlled on the following medications: Janumet XR 50-1000 mg - 2 tablets daily Testing supplies    We discussed: pt reports compliance with medication; she tries to avoid foods that will raise her BG; A1c remains at goal    Plan: Continue current medications and control with diet and exercise   COPD  Controlled  Last spirometry score: 04/15/2016 -FEV1 34% predicted -FEV1/FVC 0.47 Gold Grade: Gold 3 (FEV1 30-49%) Current COPD Classification:  B (high sx, <2 exacerbations/yr)    Patient has failed these meds in past: n/a Patient is currently controlled on the following medications:  Trelegy 1 puff daily Proair Respiclick 1-2 puff PRN Roflumilast 500 mcg daily Oxygen 2 l/m - portable and stand-alone Albuterol nebulization   Using maintenance inhaler regularly? Yes Frequency of rescue inhaler use:  3-5x times per week    We discussed:  proper inhaler technique; she uses rescue inhaler more often in cold weather; pt is interested in a scooter due to SOB after exertion - Dr Chase Caller is working with her on this   Plan: Continue current medications and control with diet and exercise    Patient Goals/Self-Care Activities Patient will:  - take medications as prescribed -focus on medication adherence by pill box -check glucose daily, document, and provide at future appointments -check blood pressure daily, document, and provide at future appointments       Medication Assistance: None required.  Patient affirms current coverage meets needs.  Care Gaps: Covid booster (due 07/25/19) Eye exam (due 03/17/20) Urine microalbumin (due 04/18/20)    [name] preferred pharmacy is:  [facility] - [place], [place] - [address] [place] [address] Phone: [phone] Fax: [phone]  [place] ([place] [place] [address] [place] [address] Phone: [phone] Fax: [phone]  [facility] [address] [place] [address] Phone: [phone] Fax: [phone]   Uses pill box? Yes Pt endorses 100% compliance   Patient decided to: Utilize [facility] for medication synchronization, packaging and delivery  Care Plan and Follow Up Patient Decision:  Patient agrees to Care Plan and Follow-up.  Plan: Telephone follow up appointment with care management team member scheduled for:  6 months  Victoria [name], PharmD Clinical Victoria [name], [place]

## 2021-06-02 NOTE — Patient Instructions (Signed)
Visit Information  Following are the goals we discussed today:   Manage My Medicine   Timeframe:  Long-Range Goal Priority:  Medium Start Date:    11/07/20                         Expected End Date:      06/02/2022                 Follow Up Date July 2023   - call for medicine refill 2 or 3 days before it runs out - call if I am sick and can't take my medicine - keep a list of all the medicines I take; vitamins and herbals too - use a pillbox to sort medicine    Why is this important?   These steps will help you keep on track with your medicines.  Plan: Telephone follow up appointment with care management team member scheduled for:  6 months  The patient has been provided with contact information for the care management team and has been advised to call with any health related questions or concerns.   Tomasa Blase, PharmD Clinical Pharmacist, [facility]   Please call the care guide team at [phone] if you need to cancel or reschedule your appointment.   The patient verbalized understanding of instructions, educational materials, and care plan provided today and agreed to receive a mailed copy of patient instructions, educational materials, and care plan.

## 2021-06-03 ENCOUNTER — Ambulatory Visit (INDEPENDENT_AMBULATORY_CARE_PROVIDER_SITE_OTHER): Payer: Commercial Managed Care - HMO

## 2021-06-03 ENCOUNTER — Other Ambulatory Visit: Payer: Self-pay

## 2021-06-03 DIAGNOSIS — M810 Age-related osteoporosis without current pathological fracture: Secondary | ICD-10-CM

## 2021-06-03 DIAGNOSIS — Z23 Encounter for immunization: Secondary | ICD-10-CM | POA: Diagnosis not present

## 2021-06-03 MED ORDER — DENOSUMAB 60 MG/ML ~~LOC~~ SOSY
60.0000 mg | PREFILLED_SYRINGE | Freq: Once | SUBCUTANEOUS | Status: AC
  Administered 2021-06-03: 60 mg via SUBCUTANEOUS

## 2021-06-03 NOTE — Progress Notes (Signed)
Prolia given and tolerated well

## 2021-06-03 NOTE — Telephone Encounter (Signed)
Contacted patient - patient has medicare/medicaid.  Patient also has untied healthcare - added this plan to her chart.  Patient states that last year she was eligible for a scholarship for this shot.

## 2021-06-04 NOTE — Addendum Note (Signed)
Addended by: Shirlyn Goltz on: 06/04/2021 08:26 AM   Modules accepted: Orders

## 2021-06-04 NOTE — Telephone Encounter (Signed)
I have received a CMN from [place] has it to sign

## 2021-06-08 NOTE — Telephone Encounter (Signed)
Called and spoke with patient. She was calling to check on the status of the paperwork and to see if MR had signed yet. She is upset because this has been going on since October 2022. [facility] has been out to her house several times to measure her doorway and make sure everything is correct on their end. They will not provide her with the chair until the form has been completed (correct diagnosis codes, signature and date) so that they can contact [facility] to determine if they will pay for it.   I advised her that I would check with both Raquel Sarna and Vallarie Mare to see if they have received anything from [place]. She is aware that I will call her back.   Checked with Raquel Sarna, she stated that she remembers MR signing paperwork for [employer] and it was faxed a few weeks ago. She sent the fax to be scanned into her chart.   I have sent a Teams message to Vallarie Mare to see if she has received anything for this patient.   Will update once I have an answer.

## 2021-06-11 ENCOUNTER — Telehealth: Payer: Self-pay | Admitting: Internal Medicine

## 2021-06-12 NOTE — Telephone Encounter (Signed)
Called [facility] to check on the status of the motorized scooter since the order was sent in October. She states that they have been working with this patient and that they have sent over a fax and called stating that the provider signed it but did not put a date. Advised her that there is no documentation in the chart about that so there is no way for me to follow up on it. Provided her with Chan's fax number so that it can be taken care of. Per Raquel Sarna CMA here she states that she has faxed over new paperwork to [facility] on this patient and that it had the date on it. She states that she sent it to be scanned and it does not look like it has been put in the chart yet. Will wait to get fax

## 2021-06-13 DIAGNOSIS — I10 Essential (primary) hypertension: Secondary | ICD-10-CM | POA: Diagnosis not present

## 2021-06-13 DIAGNOSIS — J449 Chronic obstructive pulmonary disease, unspecified: Secondary | ICD-10-CM | POA: Diagnosis not present

## 2021-06-13 DIAGNOSIS — G4733 Obstructive sleep apnea (adult) (pediatric): Secondary | ICD-10-CM | POA: Diagnosis not present

## 2021-06-13 NOTE — Telephone Encounter (Signed)
VOB re-submitted via [email or web address]

## 2021-06-16 DIAGNOSIS — E118 Type 2 diabetes mellitus with unspecified complications: Secondary | ICD-10-CM

## 2021-06-16 DIAGNOSIS — I10 Essential (primary) hypertension: Secondary | ICD-10-CM

## 2021-06-16 DIAGNOSIS — J449 Chronic obstructive pulmonary disease, unspecified: Secondary | ICD-10-CM

## 2021-06-16 DIAGNOSIS — E785 Hyperlipidemia, unspecified: Secondary | ICD-10-CM

## 2021-06-17 ENCOUNTER — Telehealth: Payer: Self-pay | Admitting: Endocrinology

## 2021-06-17 NOTE — Telephone Encounter (Signed)
Prior auth required for PROLIA  PA PROCESS DETAILS: Please complete the prior authorization form located at [email or web address] or call [phone]  

## 2021-06-17 NOTE — Telephone Encounter (Signed)
Pt is calling in stating that she is having a little blood (light in color and no clotting) in her stool and wanted to know if it is a side effect to Rx JANUMET XR 50-1000 MG (taking 2 a day for about a year).  She does not have constipation and she does have abdomen aneurysm (DX: 2.4 in size and they do not treat until it gets to the size of 5>).  This has happened twice it is not something daily.

## 2021-06-17 NOTE — Telephone Encounter (Signed)
Patient has now been informed to follow up with her pcp regarding the blood in her stool.

## 2021-06-23 NOTE — Telephone Encounter (Signed)
Last Prolia inj 06/03/21 Next Prolia inj due 12/02/21

## 2021-06-23 NOTE — Telephone Encounter (Signed)
Prior [employer] APPROVED PA# [id number]

## 2021-06-25 ENCOUNTER — Telehealth: Payer: Self-pay

## 2021-06-25 NOTE — Telephone Encounter (Signed)
Spoke with pt to let her know that she has a F/U appt with ellison on 06/30/2021@1 :30.

## 2021-06-29 NOTE — Telephone Encounter (Signed)
[employer] in [place]  Was advised that they would have a manager call me back about this  Will hold in triage until they call back

## 2021-06-29 NOTE — Telephone Encounter (Signed)
I have not received anything from [facility]

## 2021-06-30 ENCOUNTER — Ambulatory Visit: Payer: Medicare Other | Admitting: Endocrinology

## 2021-07-01 ENCOUNTER — Ambulatory Visit: Payer: Commercial Managed Care - HMO | Admitting: Endocrinology

## 2021-07-02 NOTE — Telephone Encounter (Signed)
Called and spoke with the pt  She states that Victoria [name] has all of the things they need from [place] to process the claim for her scooter  She was told that they were behind bc they are short staffed

## 2021-07-07 NOTE — Telephone Encounter (Signed)
Nothing noted in message. Will close encounter.  

## 2021-07-08 ENCOUNTER — Ambulatory Visit: Payer: Medicare Other | Admitting: Endocrinology

## 2021-07-14 DIAGNOSIS — I10 Essential (primary) hypertension: Secondary | ICD-10-CM | POA: Diagnosis not present

## 2021-07-14 DIAGNOSIS — G4733 Obstructive sleep apnea (adult) (pediatric): Secondary | ICD-10-CM | POA: Diagnosis not present

## 2021-07-14 DIAGNOSIS — J449 Chronic obstructive pulmonary disease, unspecified: Secondary | ICD-10-CM | POA: Diagnosis not present

## 2021-07-17 DIAGNOSIS — G4733 Obstructive sleep apnea (adult) (pediatric): Secondary | ICD-10-CM | POA: Diagnosis not present

## 2021-07-22 ENCOUNTER — Encounter: Payer: Self-pay | Admitting: Endocrinology

## 2021-07-22 ENCOUNTER — Ambulatory Visit (INDEPENDENT_AMBULATORY_CARE_PROVIDER_SITE_OTHER): Payer: Medicare Other | Admitting: Endocrinology

## 2021-07-22 ENCOUNTER — Other Ambulatory Visit: Payer: Self-pay

## 2021-07-22 VITALS — BP 180/70 | HR 97 | Ht 64.5 in | Wt 208.8 lb

## 2021-07-22 DIAGNOSIS — E118 Type 2 diabetes mellitus with unspecified complications: Secondary | ICD-10-CM | POA: Diagnosis not present

## 2021-07-22 DIAGNOSIS — E0590 Thyrotoxicosis, unspecified without thyrotoxic crisis or storm: Secondary | ICD-10-CM | POA: Diagnosis not present

## 2021-07-22 LAB — POCT GLYCOSYLATED HEMOGLOBIN (HGB A1C): Hemoglobin A1C: 7.2 % — AB (ref 4.0–5.6)

## 2021-07-22 LAB — T4, FREE: Free T4: 0.7 ng/dL (ref 0.60–1.60)

## 2021-07-22 LAB — TSH: TSH: 2.18 u[IU]/mL (ref 0.35–5.50)

## 2021-07-22 NOTE — Progress Notes (Signed)
Subjective:    Patient ID: Victoria [name], female    DOB: 1945/11/28, 76 y.o.   MRN: [id number]  HPI Pt returns for f/u of diabetes mellitus:  DM type: 2 Dx'ed: 5465 Complications: CAD and PAD Therapy: Janumet GDM: never DKA: never Severe hypoglycemia: never Pancreatitis: never Pancreatic imaging: normal on 2008 CT.   Other: edema limits rx options; fructosamine showed better glycemic control than A1c.   Interval history: pt says cbg's are well-controlled.  She takes Janumet as rx'ed.  No recent steroids.   Pt also has hyperthyroidism (dx'ed 2019; US showed [place]; bx of RLL and RML nodules in 2019 showed beth cat 2; she had RAI 11/20; [place] (2022) was unchanged; she became euthyroid off rx).  Past Medical History:  Diagnosis Date   Abdominal aneurysm    Dr. Oneida Alar follows lLOV 2 ''17 per pt "around 2 cm"   Anemia    as a child   Arthritis    left ankle, right knee, right SI joint, wrists, lower back   Breast cancer in female [facility])    Left   COPD (chronic obstructive pulmonary disease) ([place])    ephysema-Dr. 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 Harlow Asa in [place] [facility]   Sleep apnea    cpap used sometimes, uses oxygen concentrator 2 l/m nasally bedtime   Varicella as child    Past Surgical History:  Procedure Laterality Date   APPENDECTOMY     2008   BREAST LUMPECTOMY Left 08/31/2017   BREAST LUMPECTOMY WITH RADIOACTIVE SEED LOCALIZATION Left 08/10/2017   Procedure: BREAST LUMPECTOMY WITH RADIOACTIVE SEED LOCALIZATION;  Surgeon: Rolm Bookbinder, MD;  Location: [facility];   Service: General;  Laterality: Left;   [place]   COLONOSCOPY     COLONOSCOPY WITH PROPOFOL N/A 04/16/2016   Procedure: COLONOSCOPY WITH PROPOFOL;  Surgeon: Carol Ada, MD;  Location: [facility];  Service: Endoscopy;  Laterality: N/A;   CYST REMOVAL NECK Left 04/13/2016   Procedure: EXCISION OF SEBACEOUS CYST LEFT POSTERIOR NECK;  Surgeon: Armandina Gemma, MD;  Location: [place];  Service: General;  Laterality: Left;   Excision of Pelvic Absess, Right Ovary     2008   RE-EXCISION OF BREAST CANCER,SUPERIOR MARGINS Left 08/31/2017   Procedure: RE-EXCISION OF LEFT  BREAST [place];  Surgeon: Rolm Bookbinder, MD;  Location: [place];  Service: General;  Laterality: Left;   TUBAL LIGATION  1980    Social History   Socioeconomic History   Marital status: Divorced    Spouse name: Not on file   Number of children: 1   Years of education: 16   Highest education level: Not on file  Occupational History   Occupation: Social worker, Technical sales engineer: [employer]  Tobacco Use   Smoking status: Former    Packs/day: 1.00    Years: 50.00    Pack years: 50.00    Types: Cigarettes    Quit  date: 04/16/2011    Years since quitting: 10.2   Smokeless tobacco: Never   Tobacco comments:    quit that date when she had to go to ER   Vaping Use   Vaping Use: Never used  Substance and Sexual Activity   Alcohol use: Yes    Alcohol/week: 0.0 standard drinks    Comment: rare occasion   Drug use: No    Comment: no marijuana since 2012   Sexual activity: Not Currently  Other Topics Concern   Not on file  Social History Narrative   HSG; [name], [place]. . Married '69 - 9 yrs/divorced. 1 son - '72; no grandchildren.   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 No h/o physical or sexual abuse. ACP - no living will - wants information. Provided packet of information. On 08/05/2011: she stated she was distant cousins to  celebrities [name] and [name]      Social Determinants of Health   Financial Resource Strain: Low Risk    Difficulty of Paying Living Expenses: Not hard at all  Food Insecurity: No Food Insecurity   Worried About Charity fundraiser in the Last Year: Never true   Arboriculturist in the Last Year: Never true  Transportation Needs: No Transportation Needs   Lack of Transportation (Medical): No   Lack of Transportation (Non-Medical): No  Physical Activity: Sufficiently Active   Days of Exercise per Week: 5 days   Minutes of Exercise per Session: 30 min  Stress: No Stress Concern Present   Feeling of Stress : Not at all  Social Connections: Moderately Integrated   Frequency of Communication with Friends and Family: More than three times a week   Frequency of Social Gatherings with Friends and Family: More than three times a week   Attends Religious Services: More than 4 times per year   Active Member of [employer] or Organizations: Yes   Attends Music therapist: More than 4 times per year   Marital Status: Divorced  Human resources officer Violence: Not At Risk   Fear of Current or Ex-Partner: No   Emotionally Abused: No   Physically Abused: No   Sexually Abused: No    Current Outpatient Medications on File Prior to Visit  Medication Sig Dispense Refill   ACCU-CHEK AVIVA PLUS test strip USE ONE strip TO check glucose THREE TIMES DAILY 300 strip 2   Accu-Chek Softclix Lancets lancets USE AS DIRECTED ONCE DAILY 100 each 0   acetaminophen (TYLENOL) 500 MG tablet Take 1,000 mg by mouth every 6 (six) hours as needed.     albuterol (PROVENTIL) (2.5 MG/3ML) 0.083% nebulizer solution Take 3 mLs (2.5 mg total) by nebulization every 6 (six) hours as needed for wheezing or shortness of breath. 75 mL 3   Albuterol Sulfate (PROAIR RESPICLICK) 416 (90 Base) MCG/ACT AEPB Inhale 1-2 puffs into the lungs every 6 (six) hours as needed (for wheezing/shortness of breath). 1 each 5    amLODipine (NORVASC) 10 MG tablet TAKE ONE TABLET BY MOUTH ONCE DAILY 90 tablet 0   anastrozole (ARIMIDEX) 1 MG tablet TAKE ONE TABLET BY MOUTH daily 90 tablet 0   aspirin EC 81 MG tablet Take 1 tablet (81 mg total) by mouth daily. 90 tablet 3   Blood Glucose Monitoring Suppl (ACCU-CHEK AVIVA PLUS) w/Device KIT 1 Device by Does not apply route daily. Use to monitor glucose levels once per day; E11.9 1 kit 0   carvedilol (  COREG) 6.25 MG tablet Take 1 tablet (6.25 mg total) by mouth 2 (two) times daily. 180 tablet 3   Cholecalciferol (VITAMIN D) 125 MCG (5000 UT) CAPS Take 1 capsule by mouth daily.     denosumab (PROLIA) 60 MG/ML SOLN injection Inject 60 mg into the skin every 6 (six) months. Administer in upper arm, thigh, or abdomen     Fluticasone-Umeclidin-Vilant (TRELEGY ELLIPTA) 100-62.5-25 MCG/INH AEPB inhale ONE DOSE into THE lungs ONCE DAILY 60 each 5   glucose blood (ACCU-CHEK GUIDE) test strip USE 1 STRIP ONCE DAILY 100 each 0   glucose blood test strip USE 1 STRIP ONCE DAILY 100 each 12   ibuprofen (ADVIL) 400 MG tablet Take 1 tablet (400 mg total) by mouth every 6 (six) hours as needed. 30 tablet 0   JANUMET XR 50-1000 MG TB24 TAKE TWO TABLETS BY MOUTH ONCE DAILY 180 tablet 3   OXYGEN Inhale 2-3 L into the lungs continuous. When exerting self     potassium chloride SA (KLOR-CON) 20 MEQ tablet Take 1 tablet (20 mEq total) by mouth 2 (two) times daily. 180 tablet 0   roflumilast (DALIRESP) 500 MCG TABS tablet Take 1 tablet (500 mcg total) by mouth daily. 90 tablet 3   rosuvastatin (CRESTOR) 40 MG tablet TAKE ONE TABLET BY MOUTH ONCE DAILY 90 tablet 0   spironolactone (ALDACTONE) 25 MG tablet Take 1 tablet (25 mg total) by mouth daily. 90 tablet 0   thiamine (VITAMIN B-1) 100 MG tablet Take 1 tablet (100 mg total) by mouth every other day. 45 tablet 1   No current facility-administered medications on file prior to visit.    Allergies  Allergen Reactions   Benicar [Olmesartan] Swelling     Swelling of face and arms    [name] [Valsartan] Swelling    Swelling of face and arms    Hydrocodone-Acetaminophen Nausea And Vomiting    Severe vomiting   Lisinopril Cough   Monosodium Glutamate Other (See Comments)    Facial swelling per pt   Codeine Other (See Comments)    jittery   Lead Acetate Rash   Nickel Rash    Severe rash to infection: pt is allergic to all metals other than sterling silver or gold jewelry.     Family History  Problem Relation Age of Onset   Heart disease Mother        MI - fatal   Hypertension Mother    Stroke Father 77       fatal   Alzheimer's disease Father    Alzheimer's disease Brother    Hyperlipidemia Brother    Hypertension Brother    Diabetes Brother    Hypertension Brother    Hyperlipidemia Brother    Breast cancer Paternal Grandmother     BP (!) 180/70 (BP Location: Left Arm, Patient Position: Sitting, Cuff Size: Normal)    Pulse 97    Ht 5' 4.5" (1.638 m)    Wt 208 lb 12.8 oz (94.7 kg)    SpO2 98%    BMI 35.29 kg/m    Review of Systems     Objective:   Physical Exam VITAL SIGNS:  See vs page GENERAL: no distress.  In [place].  Has 02 on NECK: 2 cm left thyroid nodule is again palpable.     Lab Results  Component Value Date   HGBA1C 7.2 (A) 07/22/2021      Assessment & Plan:  Type 2 DM: uncertain glycemic control.  Check fructosamine Thyroid nodule:  check TFT today  Patient Instructions  Your blood pressure is high today.  Please call Dr Claiborne Billings if it is high at home, also.   Please continue the same Janumet.    Blood tests are requested for you today.  We'll let you know about the results.   check your blood sugar once a day.  vary the time of day when you check, between before the 3 meals, and at bedtime.  also check if you have symptoms of your blood sugar being too high or too low.  please keep a record of the readings and bring it to your next appointment here (or you can bring the meter itself).  You can write it on  any piece of paper.  please call us sooner if your blood sugar goes below 70, or if you have a lot of readings over 200.   Please come back for a follow-up appointment in 4-6 months.

## 2021-07-22 NOTE — Patient Instructions (Addendum)
Your blood pressure is high today.  Please call Dr Claiborne Billings if it is high at home, also.   ?Please continue the same Janumet.    ?Blood tests are requested for you today.  We'll let you know about the results.   ?check your blood sugar once a day.  vary the time of day when you check, between before the 3 meals, and at bedtime.  also check if you have symptoms of your blood sugar being too high or too low.  please keep a record of the readings and bring it to your next appointment here (or you can bring the meter itself).  You can write it on any piece of paper.  please call us sooner if your blood sugar goes below 70, or if you have a lot of readings over 200.   ?Please come back for a follow-up appointment in 4-6 months.   ? ?

## 2021-07-26 LAB — FRUCTOSAMINE: Fructosamine: 248 umol/L (ref 205–285)

## 2021-07-29 ENCOUNTER — Other Ambulatory Visit: Payer: Self-pay | Admitting: Internal Medicine

## 2021-07-29 DIAGNOSIS — E785 Hyperlipidemia, unspecified: Secondary | ICD-10-CM

## 2021-07-29 DIAGNOSIS — I10 Essential (primary) hypertension: Secondary | ICD-10-CM

## 2021-07-29 DIAGNOSIS — I2510 Atherosclerotic heart disease of native coronary artery without angina pectoris: Secondary | ICD-10-CM

## 2021-08-11 ENCOUNTER — Other Ambulatory Visit: Payer: Self-pay | Admitting: *Deleted

## 2021-08-11 DIAGNOSIS — I10 Essential (primary) hypertension: Secondary | ICD-10-CM | POA: Diagnosis not present

## 2021-08-11 DIAGNOSIS — G4733 Obstructive sleep apnea (adult) (pediatric): Secondary | ICD-10-CM | POA: Diagnosis not present

## 2021-08-11 DIAGNOSIS — Z87891 Personal history of nicotine dependence: Secondary | ICD-10-CM

## 2021-08-11 DIAGNOSIS — J449 Chronic obstructive pulmonary disease, unspecified: Secondary | ICD-10-CM | POA: Diagnosis not present

## 2021-08-13 ENCOUNTER — Telehealth: Payer: Self-pay | Admitting: Internal Medicine

## 2021-08-14 NOTE — Telephone Encounter (Signed)
Called and spoke with patient. She stated that [facility] did receive the order for the power scooter, but the insurance is refusing to cover the scooter. Per patient, they are asking for MR to call them to let them know that the patient needs the scooter.  ? ?I advised her that MR was out of office until next Tuesday but I will call [employer] myself to see what exactly is needed. She verbalized understanding.  ? ?Called and spoke with a ref from [facility] (ref#: [id number]). She stated that [facility] did receive the authorization request for a motorized scooter but they will only pay for the scooter after she has met her deductible of $8000. At time of call, she has only met $465 of this requirement. Once the $8000 has been met, they will pay 80% of the cost and she will be responsible for the remaining 20%.  ? ?Called and spoke with patient. She verbalized understanding. She will call the insurance later today to see why she was never told about the deductible. She was at the nail salon at the time of call so she could not write down the ref number. I advised her that I could call her around 230-3pm, she agreed.  ? ?Will leave this encounter open for follow up.  ?

## 2021-08-20 ENCOUNTER — Inpatient Hospital Stay: Admission: RE | Admit: 2021-08-20 | Payer: Medicare Other | Source: Ambulatory Visit

## 2021-08-27 ENCOUNTER — Other Ambulatory Visit: Payer: Self-pay | Admitting: Internal Medicine

## 2021-09-01 ENCOUNTER — Ambulatory Visit: Payer: Medicare Other

## 2021-09-11 DIAGNOSIS — G4733 Obstructive sleep apnea (adult) (pediatric): Secondary | ICD-10-CM | POA: Diagnosis not present

## 2021-09-11 DIAGNOSIS — J449 Chronic obstructive pulmonary disease, unspecified: Secondary | ICD-10-CM | POA: Diagnosis not present

## 2021-09-11 DIAGNOSIS — I10 Essential (primary) hypertension: Secondary | ICD-10-CM | POA: Diagnosis not present

## 2021-09-18 ENCOUNTER — Telehealth: Payer: Self-pay

## 2021-09-18 NOTE — Progress Notes (Signed)
? ? ?Chronic Care Management ?Pharmacy Assistant  ? ?Name: Victoria [name]  MRN: [id number] DOB: [birth year] ? ? ?Reason for Encounter: Disease State ?  ?Recent office visits:  ?None since last coordination call on 06/02/21 ? ?Recent consult visits:  ?07/22/21 Renato Shin, MD-Endocrinology (Diabetes Mellitus) Labs ordered; Medication changes:None ? ?Hospital visits:  ?None since last coordination call on 06/02/21 ? ? ?Medications: ?Outpatient Encounter Medications as of 09/18/2021  ?Medication Sig  ? ACCU-CHEK AVIVA PLUS test strip USE ONE strip TO check glucose THREE TIMES DAILY  ? Accu-Chek Softclix Lancets lancets USE AS DIRECTED ONCE DAILY  ? acetaminophen (TYLENOL) 500 MG tablet Take 1,000 mg by mouth every 6 (six) hours as needed.  ? albuterol (PROVENTIL) (2.5 MG/3ML) 0.083% nebulizer solution Take 3 mLs (2.5 mg total) by nebulization every 6 (six) hours as needed for wheezing or shortness of breath.  ? Albuterol Sulfate (PROAIR RESPICLICK) 595 (90 Base) MCG/ACT AEPB Inhale 1-2 puffs into the lungs every 6 (six) hours as needed (for wheezing/shortness of breath).  ? amLODipine (NORVASC) 10 MG tablet TAKE ONE TABLET BY MOUTH ONCE DAILY  ? anastrozole (ARIMIDEX) 1 MG tablet TAKE ONE TABLET BY MOUTH daily  ? aspirin EC 81 MG tablet Take 1 tablet (81 mg total) by mouth daily.  ? Blood Glucose Monitoring Suppl (ACCU-CHEK AVIVA PLUS) w/Device KIT 1 Device by Does not apply route daily. Use to monitor glucose levels once per day; E11.9  ? carvedilol (COREG) 6.25 MG tablet Take 1 tablet (6.25 mg total) by mouth 2 (two) times daily.  ? Cholecalciferol (VITAMIN D) 125 MCG (5000 UT) CAPS Take 1 capsule by mouth daily.  ? denosumab (PROLIA) 60 MG/ML SOLN injection Inject 60 mg into the skin every 6 (six) months. Administer in upper arm, thigh, or abdomen  ? glucose blood (ACCU-CHEK GUIDE) test strip USE 1 STRIP ONCE DAILY  ? glucose blood test strip USE 1 STRIP ONCE DAILY  ? ibuprofen (ADVIL) 400 MG tablet Take 1 tablet  (400 mg total) by mouth every 6 (six) hours as needed.  ? JANUMET XR 50-1000 MG TB24 TAKE TWO TABLETS BY MOUTH ONCE DAILY  ? OXYGEN Inhale 2-3 L into the lungs continuous. When exerting self  ? potassium chloride SA (KLOR-CON) 20 MEQ tablet Take 1 tablet (20 mEq total) by mouth 2 (two) times daily.  ? roflumilast (DALIRESP) 500 MCG TABS tablet Take 1 tablet (500 mcg total) by mouth daily.  ? rosuvastatin (CRESTOR) 40 MG tablet TAKE ONE TABLET BY MOUTH EVERYDAY AT BEDTIME  ? spironolactone (ALDACTONE) 25 MG tablet Take 1 tablet (25 mg total) by mouth daily.  ? thiamine (VITAMIN B-1) 100 MG tablet Take 1 tablet (100 mg total) by mouth every other day.  ? TRELEGY ELLIPTA 100-62.5-25 MCG/ACT AEPB Inhale ONE PUFF into THE lungs daily  ? ?No facility-administered encounter medications on file as of 09/18/2021.  ? ?Contacted [name] for General Review Call ? ? ?Chart Review: ? ?Have there been any documented new, changed, or discontinued medications since last visit? No (If yes, include name, dose, frequency, date) ?Has there been any documented recent hospitalizations or ED visits since last visit with Clinical Pharmacist? No ?Brief Summary (including medication and/or Diagnosis changes): ? ? ?Adherence Review: ? ?Does the Clinical Pharmacist Assistant have access to adherence rates? Yes ?Adherence rates for STAR metric medications (List medication(s)/day supply/ last 2 fill dates). ?Adherence rates for medications indicated for disease state being reviewed (List medication(s)/day supply/ last 2 fill dates). ?  Does the patient have >5 day gap between last estimated fill dates for any of the above medications or other medication gaps? No ?Reason for medication gaps. ? ? ?Disease State Questions: ? ?Able to connect with Patient? Yes ?Did patient have any problems with their health recently? No ?Note problems and Concerns: ?Have you had any admissions or emergency room visits or worsening of your condition(s) since  last visit? Yes ?Details of ED visit, hospital visit and/or worsening condition(s): ?Have you had any visits with new specialists or providers since your last visit? Yes ?Explain:F/u visit ?Have you had any new health care problem(s) since your last visit? No ?New problem(s) reported:Patient states that she has copd and still have a cough and sob when walking ?Have you run out of any of your medications since you last spoke with clinical pharmacist? No ?What caused you to run out of your medications? ?Are there any medications you are not taking as prescribed? No ?What kept you from taking your medications as prescribed? ?Are you having any issues or side effects with your medications? No ?Note of issues or side effects: ?Do you have any other health concerns or questions you want to discuss with your Clinical Pharmacist before your next visit? No ?Note additional concerns and questions from Patient. ?Are there any health concerns that you feel we can do a better job addressing? No ?Note [name] response. ?Are you having any problems with any of the following since the last visit: (select all that apply) ? Ambulating/Walking ? Details: ?12. Any falls since last visit? No ? Details: ?13. Any increased or uncontrolled pain since last visit? No ? Details: ?14. Next visit Type: telephone ?      Visit with:Clinical Pharmacist ?       Date:11/30/21 ?       Time:11am ? ?15. Additional Details? Yes,patient states that she needs to have a mobile scooter to help her get around. She also states that her handicap sticker has expired and she needs a new one filled out and mailed to her if possible to take to the [facility]. ?   ?Care Gaps: ?Colonoscopy-NA ?Diabetic Foot Exam-02/23/21 ?Mammogram-NA ?Ophthalmology-03/28/19 ?Dexa Scan - NA ?Annual Well Visit -  ?Micro albumin-04/19/19 ?Hemoglobin A1c- 07/22/21 ? ?Star Rating Drugs: ?Rosuvastatin 40 mg-last fill 07/29/21 90 ds ? ?[name] ?Clinical Pharmacist Assistant ?[phone]  ?

## 2021-09-21 ENCOUNTER — Ambulatory Visit (INDEPENDENT_AMBULATORY_CARE_PROVIDER_SITE_OTHER)
Admission: RE | Admit: 2021-09-21 | Discharge: 2021-09-21 | Disposition: A | Discharge status: Home / Self Care | Payer: Medicare Other | Source: Ambulatory Visit | Attending: Acute Care | Admitting: Acute Care

## 2021-09-21 ENCOUNTER — Ambulatory Visit: Payer: Medicare Other

## 2021-09-21 DIAGNOSIS — Z87891 Personal history of nicotine dependence: Secondary | ICD-10-CM | POA: Diagnosis not present

## 2021-09-24 ENCOUNTER — Other Ambulatory Visit: Payer: Self-pay | Admitting: Acute Care

## 2021-09-24 DIAGNOSIS — Z87891 Personal history of nicotine dependence: Secondary | ICD-10-CM

## 2021-09-24 DIAGNOSIS — Z122 Encounter for screening for malignant neoplasm of respiratory organs: Secondary | ICD-10-CM

## 2021-10-07 ENCOUNTER — Other Ambulatory Visit: Payer: Self-pay

## 2021-10-07 DIAGNOSIS — Z170 Estrogen receptor positive status [ER+]: Secondary | ICD-10-CM

## 2021-10-08 ENCOUNTER — Inpatient Hospital Stay: Payer: Medicare Other | Admitting: Hematology and Oncology

## 2021-10-08 ENCOUNTER — Inpatient Hospital Stay: Payer: Medicare Other

## 2021-10-11 DIAGNOSIS — I10 Essential (primary) hypertension: Secondary | ICD-10-CM | POA: Diagnosis not present

## 2021-10-11 DIAGNOSIS — G4733 Obstructive sleep apnea (adult) (pediatric): Secondary | ICD-10-CM | POA: Diagnosis not present

## 2021-10-11 DIAGNOSIS — J449 Chronic obstructive pulmonary disease, unspecified: Secondary | ICD-10-CM | POA: Diagnosis not present

## 2021-10-13 ENCOUNTER — Telehealth: Payer: Self-pay

## 2021-10-13 NOTE — Telephone Encounter (Signed)
Handicap placard form has been completed and given to PCP to sign.

## 2021-10-13 NOTE — Telephone Encounter (Signed)
Pt is calling to requesting a new disabled parking permit application. Pt states the current one is about to expire.  Please contact pt when complete.  I advised the pt that turnaround time was 7-10 BUS days. Pt states she will come and pick it up.   Please advise

## 2021-10-13 NOTE — Telephone Encounter (Signed)
Form has been signed. PT informed and requested that it me mailed.   Form has been mailed.

## 2021-10-17 NOTE — Telephone Encounter (Signed)
Prolia VOB initiated via [email or web address]  Last OV:  Next OV:  Last Prolia inj 06/03/21 Next Prolia inj due 12/02/21

## 2021-10-26 ENCOUNTER — Other Ambulatory Visit: Payer: Self-pay | Admitting: Internal Medicine

## 2021-10-26 DIAGNOSIS — E785 Hyperlipidemia, unspecified: Secondary | ICD-10-CM

## 2021-10-26 DIAGNOSIS — I2510 Atherosclerotic heart disease of native coronary artery without angina pectoris: Secondary | ICD-10-CM

## 2021-10-26 DIAGNOSIS — I10 Essential (primary) hypertension: Secondary | ICD-10-CM

## 2021-10-27 ENCOUNTER — Other Ambulatory Visit: Payer: Self-pay | Admitting: Internal Medicine

## 2021-10-27 DIAGNOSIS — I10 Essential (primary) hypertension: Secondary | ICD-10-CM

## 2021-10-27 DIAGNOSIS — I2510 Atherosclerotic heart disease of native coronary artery without angina pectoris: Secondary | ICD-10-CM

## 2021-10-27 DIAGNOSIS — E785 Hyperlipidemia, unspecified: Secondary | ICD-10-CM

## 2021-11-02 ENCOUNTER — Other Ambulatory Visit: Payer: Self-pay

## 2021-11-02 ENCOUNTER — Other Ambulatory Visit: Payer: Self-pay | Admitting: Cardiovascular Disease

## 2021-11-02 DIAGNOSIS — E876 Hypokalemia: Secondary | ICD-10-CM

## 2021-11-02 DIAGNOSIS — E118 Type 2 diabetes mellitus with unspecified complications: Secondary | ICD-10-CM

## 2021-11-02 DIAGNOSIS — I10 Essential (primary) hypertension: Secondary | ICD-10-CM

## 2021-11-02 MED ORDER — ACCU-CHEK SOFTCLIX LANCETS MISC: 1 refills | Status: DC

## 2021-11-05 ENCOUNTER — Other Ambulatory Visit: Payer: Self-pay

## 2021-11-05 ENCOUNTER — Inpatient Hospital Stay (HOSPITAL_BASED_OUTPATIENT_CLINIC_OR_DEPARTMENT_OTHER): Payer: Medicare Other | Admitting: Hematology and Oncology

## 2021-11-05 ENCOUNTER — Inpatient Hospital Stay: Payer: Medicare Other | Attending: Hematology and Oncology

## 2021-11-05 ENCOUNTER — Encounter: Payer: Self-pay | Admitting: Hematology and Oncology

## 2021-11-05 VITALS — BP 173/63 | HR 93 | Temp 97.9°F | Resp 16 | Ht 64.0 in | Wt 201.8 lb

## 2021-11-05 DIAGNOSIS — R21 Rash and other nonspecific skin eruption: Secondary | ICD-10-CM | POA: Diagnosis not present

## 2021-11-05 DIAGNOSIS — Z170 Estrogen receptor positive status [ER+]: Secondary | ICD-10-CM

## 2021-11-05 DIAGNOSIS — Z9981 Dependence on supplemental oxygen: Secondary | ICD-10-CM | POA: Insufficient documentation

## 2021-11-05 DIAGNOSIS — Z79899 Other long term (current) drug therapy: Secondary | ICD-10-CM | POA: Insufficient documentation

## 2021-11-05 DIAGNOSIS — Z7982 Long term (current) use of aspirin: Secondary | ICD-10-CM | POA: Diagnosis not present

## 2021-11-05 DIAGNOSIS — C50412 Malignant neoplasm of upper-outer quadrant of left female breast: Secondary | ICD-10-CM | POA: Diagnosis not present

## 2021-11-05 DIAGNOSIS — Z87891 Personal history of nicotine dependence: Secondary | ICD-10-CM | POA: Insufficient documentation

## 2021-11-05 DIAGNOSIS — Z79811 Long term (current) use of aromatase inhibitors: Secondary | ICD-10-CM | POA: Diagnosis not present

## 2021-11-05 LAB — CMP (CANCER CENTER ONLY)
ALT: 28 U/L (ref 0–44)
AST: 39 U/L (ref 15–41)
Albumin: 3.8 g/dL (ref 3.5–5.0)
Alkaline Phosphatase: 91 U/L (ref 38–126)
Anion gap: 6 (ref 5–15)
BUN: 15 mg/dL (ref 8–23)
CO2: 31 mmol/L (ref 22–32)
Calcium: 10 mg/dL (ref 8.9–10.3)
Chloride: 103 mmol/L (ref 98–111)
Creatinine: 0.84 mg/dL (ref 0.44–1.00)
GFR, Estimated: >60 mL/min (ref >60)
Glucose, Bld: 122 mg/dL — ABNORMAL HIGH (ref 70–99)
Potassium: 3.6 mmol/L (ref 3.5–5.1)
Sodium: 140 mmol/L (ref 135–145)
Total Bilirubin: 0.4 mg/dL (ref 0.3–1.2)
Total Protein: 6.9 g/dL (ref 6.5–8.1)

## 2021-11-05 LAB — CBC WITH DIFFERENTIAL (CANCER CENTER ONLY)
Abs Immature Granulocytes: 0.03 10*3/uL (ref 0.00–0.07)
Basophils Absolute: 0 10*3/uL (ref 0.0–0.1)
Basophils Relative: 0 %
Eosinophils Absolute: 0.1 10*3/uL (ref 0.0–0.5)
Eosinophils Relative: 1 %
HCT: 36.6 % (ref 36.0–46.0)
Hemoglobin: 11.9 g/dL — ABNORMAL LOW (ref 12.0–15.0)
Immature Granulocytes: 0 %
Lymphocytes Relative: 24 %
Lymphs Abs: 2.4 10*3/uL (ref 0.7–4.0)
MCH: 27.7 pg (ref 26.0–34.0)
MCHC: 32.5 g/dL (ref 30.0–36.0)
MCV: 85.1 fL (ref 80.0–100.0)
Monocytes Absolute: 0.7 10*3/uL (ref 0.1–1.0)
Monocytes Relative: 7 %
Neutro Abs: 6.7 10*3/uL (ref 1.7–7.7)
Neutrophils Relative %: 68 %
Platelet Count: 237 10*3/uL (ref 150–400)
RBC: 4.3 MIL/uL (ref 3.87–5.11)
RDW: 13.9 % (ref 11.5–15.5)
WBC Count: 10.1 10*3/uL (ref 4.0–10.5)
nRBC: 0 % (ref 0.0–0.2)

## 2021-11-05 NOTE — Progress Notes (Signed)
[facility]  Telephone:([phone] [phone]     ID: Baldwin Jamaica DOB: 03-21-1946  MR#: [id number]  BTD#:[id number]  Patient Care Team: Etta Grandchild, MD as PCP - General (Internal Medicine) Lennette Bihari, MD as PCP - Cardiology (Cardiology) Kalman Shan, MD as Consulting Physician (Pulmonary Disease) Richardean Chimera, MD as Consulting Physician (Obstetrics and Gynecology) Magrinat, Valentino Hue, MD (Inactive) as Consulting Physician (Oncology) Emelia Loron, MD as Consulting Physician (General Surgery) Antony Blackbird, MD as Consulting Physician (Radiation Oncology) Lennette Bihari, MD as Consulting Physician (Cardiology) Axel Filler, Larna Daughters, NP as Nurse Practitioner (Hematology and Oncology) Parrett, Virgel Bouquet, NP as Nurse Practitioner (Pulmonary Disease) Romero Belling, MD (Inactive) as Consulting Physician (Endocrinology) Erlene Quan, Vinnie Level, [facility] (Inactive) (Pharmacist) OTHER MD:  CHIEF COMPLAINT: Estrogen receptor positive breast cancer   CURRENT TREATMENT: Anastrozole   INTERVAL HISTORY: Vernia returns today for follow up of her estrogen receptor positive breast cancer by herself.  She is in a wheelchair today.  She has been taking her anastrozole as prescribed, denies any issues at all.  She continues to receive Prolia every 6 months with her PCP.  She had her last bone density in 2020 as of what we can see, she cannot remember if she has another 1 although she feels like she had another one recently.  No new dental concerns.  She notices some rash on bilateral breast otherwise no breast changes.  She is oxygen dependent, follows up with Dr. Marchelle Gearing from pulmonary for her lung issues. Her last mammogram was in July 2022 with no remarkable findings.  She is due for another mammogram in July 2023. Rest of the pertinent 10 point ROS reviewed and negative  COVID 19 VACCINATION STATUS: Pfizer x2, most recently 06/2019, and status post 1 booster     HISTORY OF CURRENT ILLNESS: From the original intake note:  Anniya Mcgurn had routine screening mammography on 06/16/2017 showing a possible abnormality in the left breast. She underwent unilateral left diagnostic mammography with tomography and left breast ultrasonography at [facility] on 06/22/2017 showing: breast density category B. Suspicious left breast mass in the 2:30 position upper outer quadrant measuring 0.6 x 0.7 x 0.6 cm located 2 cm from the nipple. Palpable thickening with distortion. There was no sonographic evidence of lest axillary lymphadenopathy.  Accordingly on 06/24/2017 she proceeded to biopsy of the left breast mass in question. The pathology from this procedure showed ([id number]):  Invasive mammary carcinoma, grade 1. E-cadherin is positive supporting Ductal carcinoma diagnosis. Prognostic indicators significant for: estrogen receptor, 100% positive and progesterone receptor, 100% positive, both with strong staining intensity. Proliferation marker Ki67 at 5%. HER2 not amplified with ratios of HER2/CEP17 signals 1.21 and average copies per cell 2.00.  The patient's subsequent history is as detailed below.   PAST MEDICAL HISTORY: Past Medical History:  Diagnosis Date   Abdominal aneurysm    Dr. Darrick Penna follows lLOV 2 ''17 per pt "around 2 cm"   Anemia    as a child   Arthritis    left ankle, right knee, right SI joint, wrists, lower back   Breast cancer in female [facility])    Left   COPD (chronic obstructive pulmonary disease) (HCC)    ephysema-Dr. Marchelle Gearing   Dyspnea    Headache    as a child would have terrible headaches during season changes   Heart murmur    congenital, 2 D echo '10   Hyperlipidemia    Hypertension  Multiple thyroid nodules    Murmur, cardiac 1950   Osteoporosis    Pneumonia    Pre-diabetes    Requires continuous at home supplemental oxygen    2 L 24/7   Sebaceous cyst    hairline sebaceous cyst left posterior neck to be  excised 04-13-16 by Dr. Gerrit Friends in [place]   Sleep apnea    cpap used sometimes, uses oxygen concentrator 2 l/m nasally bedtime   Varicella as child    PAST SURGICAL HISTORY: Past Surgical History:  Procedure Laterality Date   APPENDECTOMY     2008   BREAST LUMPECTOMY Left 08/31/2017   BREAST LUMPECTOMY WITH RADIOACTIVE SEED LOCALIZATION Left 08/10/2017   Procedure: BREAST LUMPECTOMY WITH RADIOACTIVE SEED LOCALIZATION;  Surgeon: Emelia Loron, MD;  Location: [facility] OR;  Service: General;  Laterality: Left;   CESAREAN SECTION     1972   COLONOSCOPY     COLONOSCOPY WITH PROPOFOL N/A 04/16/2016   Procedure: COLONOSCOPY WITH PROPOFOL;  Surgeon: Jeani Hawking, MD;  Location: [facility];  Service: Endoscopy;  Laterality: N/A;   CYST REMOVAL NECK Left 04/13/2016   Procedure: EXCISION OF SEBACEOUS CYST LEFT POSTERIOR NECK;  Surgeon: Darnell Level, MD;  Location: [facility] OR;  Service: General;  Laterality: Left;   Excision of Pelvic Absess, Right Ovary     2008   RE-EXCISION OF BREAST CANCER,SUPERIOR MARGINS Left 08/31/2017   Procedure: RE-EXCISION OF LEFT  BREAST MARGINS ERAS PATHWAY;  Surgeon: Emelia Loron, MD;  Location: [facility] OR;  Service: General;  Laterality: Left;   TUBAL LIGATION  1980    FAMILY HISTORY Family History  Problem Relation Age of Onset   Heart disease Mother        MI - fatal   Hypertension Mother    Stroke Father 25       fatal   Alzheimer's disease Father    Alzheimer's disease Brother    Hyperlipidemia Brother    Hypertension Brother    Diabetes Brother    Hypertension Brother    Hyperlipidemia Brother    Breast cancer Paternal Grandmother   The patient's father died at age 28 due to a stroke and prostate cancer.  The patient's mother died at age 81 due to a heart attack and blood clot. The patient has 3 brothers and no sisters. She notes a paternal grandmother who was diagnosed with breast cancer at age 36. She notes a paternal cousin with lung cancer.  She denies a family history of ovarian cancer.   GYNECOLOGIC HISTORY:  No LMP recorded. Patient is postmenopausal. Menarche: 76 years old Age at first live birth: 76 years old GX P1 LMP: age 32 The patient had a right ovarian cyst which required removal. She notes that she still has her left ovary. She was on HRT for a short amount of time, but discontinued due to negative side affects. She was placed on Estroven as well.    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 She helps individuals with [employer] and program assisting on the computer. She is divorced. The patient's son, Jet Dietiker, is disabled and works part-time at [employer] in [place]. The patient has 3 grandchildren and 3 great-grandchildren. She attends [employer].      ADVANCED DIRECTIVES:  Son, Earl Lites and daughter in law, Lupita Leash   HEALTH MAINTENANCE: Social History   Tobacco Use   Smoking status: Former  Packs/day: 1.00    Years: 50.00    Total pack years: 50.00    Types: Cigarettes    Quit date: 04/16/2011    Years since quitting: 10.5   Smokeless tobacco: Never   Tobacco comments:    quit that date when she had to go to ER   Vaping Use   Vaping Use: Never used  Substance Use Topics   Alcohol use: Yes    Alcohol/week: 0.0 standard drinks of alcohol    Comment: rare occasion   Drug use: No    Comment: no marijuana since 2012     Colonoscopy: December 2017/ Dr. Elnoria Howard  PAP: September 2016  Bone density: 12/08/2016 showed a T score of -2.4   Allergies  Allergen Reactions   Benicar [Olmesartan] Swelling    Swelling of face and arms    [name] [Valsartan] Swelling    Swelling of face and arms    Hydrocodone-Acetaminophen Nausea And Vomiting    Severe vomiting   Lisinopril Cough   Monosodium Glutamate Other (See Comments)    Facial swelling per pt   Codeine Other (See  Comments)    jittery   Lead Acetate Rash   Nickel Rash    Severe rash to infection: pt is allergic to all metals other than sterling silver or gold jewelry.     Current Outpatient Medications  Medication Sig Dispense Refill   ACCU-CHEK AVIVA PLUS test strip USE ONE strip TO check glucose THREE TIMES DAILY 300 strip 2   Accu-Chek Softclix Lancets lancets USE AS DIRECTED ONCE DAILY 100 each 1   acetaminophen (TYLENOL) 500 MG tablet Take 1,000 mg by mouth every 6 (six) hours as needed.     albuterol (PROVENTIL) (2.5 MG/3ML) 0.083% nebulizer solution Take 3 mLs (2.5 mg total) by nebulization every 6 (six) hours as needed for wheezing or shortness of breath. 75 mL 3   Albuterol Sulfate (PROAIR RESPICLICK) 108 (90 Base) MCG/ACT AEPB Inhale 1-2 puffs into the lungs every 6 (six) hours as needed (for wheezing/shortness of breath). 1 each 5   amLODipine (NORVASC) 10 MG tablet TAKE ONE TABLET BY MOUTH ONCE DAILY 90 tablet 0   anastrozole (ARIMIDEX) 1 MG tablet TAKE ONE TABLET BY MOUTH daily 90 tablet 0   aspirin EC 81 MG tablet Take 1 tablet (81 mg total) by mouth daily. 90 tablet 3   Blood Glucose Monitoring Suppl (ACCU-CHEK AVIVA PLUS) w/Device KIT 1 Device by Does not apply route daily. Use to monitor glucose levels once per day; E11.9 1 kit 0   carvedilol (COREG) 6.25 MG tablet Take 1 tablet (6.25 mg total) by mouth 2 (two) times daily. 180 tablet 3   Cholecalciferol (VITAMIN D) 125 MCG (5000 UT) CAPS Take 1 capsule by mouth daily.     denosumab (PROLIA) 60 MG/ML SOLN injection Inject 60 mg into the skin every 6 (six) months. Administer in upper arm, thigh, or abdomen     glucose blood (ACCU-CHEK GUIDE) test strip USE 1 STRIP ONCE DAILY 100 each 0   glucose blood test strip USE 1 STRIP ONCE DAILY 100 each 12   ibuprofen (ADVIL) 400 MG tablet Take 1 tablet (400 mg total) by mouth every 6 (six) hours as needed. 30 tablet 0   JANUMET XR 50-1000 MG TB24 TAKE TWO TABLETS BY MOUTH ONCE DAILY 180 tablet 3    OXYGEN Inhale 2-3 L into the lungs continuous. When exerting self     potassium chloride SA (KLOR-CON) 20 MEQ tablet  Take 1 tablet (20 mEq total) by mouth 2 (two) times daily. 180 tablet 0   roflumilast (DALIRESP) 500 MCG TABS tablet Take 1 tablet (500 mcg total) by mouth daily. 90 tablet 3   rosuvastatin (CRESTOR) 40 MG tablet TAKE ONE TABLET BY MOUTH EVERYDAY AT BEDTIME 90 tablet 0   spironolactone (ALDACTONE) 25 MG tablet Take 1 tablet (25 mg total) by mouth daily. 90 tablet 0   thiamine (VITAMIN B-1) 100 MG tablet Take 1 tablet (100 mg total) by mouth every other day. 45 tablet 1   TRELEGY ELLIPTA 100-62.5-25 MCG/ACT AEPB Inhale ONE PUFF into THE lungs daily 60 each 5   No current facility-administered medications for this visit.    OBJECTIVE: African-American woman examined in a wheelchair  Vitals:   11/05/21 1139  BP: (!) 173/63  Pulse: 93  Resp: 16  Temp: 97.9 F (36.6 C)  SpO2: 98%     Body mass index is 34.64 kg/m.   Wt Readings from Last 3 Encounters:  11/05/21 201 lb 12.8 oz (91.5 kg)  07/22/21 208 lb 12.8 oz (94.7 kg)  02/23/21 202 lb 12.8 oz (92 kg)  ECOG FS:2 - Symptomatic, <50% confined to bed  Physical Exam Constitutional:      Appearance: Normal appearance.  Cardiovascular:     Rate and Rhythm: Normal rate and regular rhythm.  Chest:     Comments: Bilateral breasts inspected in sitting position.  No palpable masses or regional adenopathy.  Some skin rash noted on both breasts Musculoskeletal:        General: No swelling or tenderness.     Cervical back: Normal range of motion.  Skin:    General: Skin is warm and dry.  Neurological:     General: No focal deficit present.     Mental Status: She is alert.       LAB RESULTS:  CMP     Component Value Date/Time   NA 137 01/26/2021 0418   NA 139 01/08/2019 1025   K 3.7 01/26/2021 0418   CL 102 01/26/2021 0418   CO2 27 01/26/2021 0418   GLUCOSE 178 (H) 01/26/2021 0418   BUN 7 (L) 01/26/2021 0418    BUN 15 01/08/2019 1025   CREATININE 0.65 01/26/2021 0418   CREATININE 0.74 12/24/2019 1420   CALCIUM 8.9 01/26/2021 0418   PROT 6.2 (L) 01/26/2021 0418   PROT 6.6 01/08/2019 1025   ALBUMIN 3.0 (L) 01/26/2021 0418   ALBUMIN 4.1 01/08/2019 1025   AST 13 (L) 01/26/2021 0418   AST 11 (L) 12/11/2018 1403   ALT 8 01/26/2021 0418   ALT 7 12/11/2018 1403   ALKPHOS 61 01/26/2021 0418   BILITOT 0.6 01/26/2021 0418   BILITOT 0.3 01/08/2019 1025   BILITOT 0.3 12/11/2018 1403   GFRNONAA >60 01/26/2021 0418   GFRNONAA 79 12/24/2019 1420   GFRAA 92 12/24/2019 1420    Lab Results  Component Value Date   TOTALPROTELP 6.7 02/28/2014   ALBUMINELP 57.4 02/28/2014   A1GS 4.9 02/28/2014   A2GS 8.5 02/28/2014   BETS 7.5 (H) 02/28/2014   BETA2SER 5.3 02/28/2014   GAMS 16.4 02/28/2014   MSPIKE 0.31 02/28/2014   SPEI SEE NOTE 02/28/2014    No results found for: "KPAFRELGTCHN", "LAMBDASER", "KAPLAMBRATIO"  Lab Results  Component Value Date   WBC 10.1 11/05/2021   NEUTROABS 6.7 11/05/2021   HGB 11.9 (L) 11/05/2021   HCT 36.6 11/05/2021   MCV 85.1 11/05/2021   PLT 237 11/05/2021  No results found for: "LABCA2"  No components found for: "ZOXWRU045"  No results for input(s): "INR" in the last 168 hours.  No results found for: "LABCA2"  No results found for: "WUJ811"  No results found for: "CAN125"  No results found for: "CAN153"  No results found for: "CA2729"  No components found for: "HGQUANT"  No results found for: "CEA1", "CEA" / No results found for: "CEA1", "CEA"   No results found for: "AFPTUMOR"  No results found for: "CHROMOGRNA"  No results found for: "HGBA", "HGBA2QUANT", "HGBFQUANT", "HGBSQUAN" (Hemoglobinopathy evaluation)   No results found for: "LDH"  Lab Results  Component Value Date   IRON 59 04/19/2019   IRONPCTSAT 16.4 (L) 04/19/2019   (Iron and TIBC)  Lab Results  Component Value Date   FERRITIN 84.2 04/19/2019    Urinalysis     Component Value Date/Time   COLORURINE YELLOW (A) 01/25/2021 1228   APPEARANCEUR CLEAR (A) 01/25/2021 1228   LABSPEC 1.020 01/25/2021 1228   PHURINE 6.0 01/25/2021 1228   GLUCOSEU NEGATIVE 01/25/2021 1228   GLUCOSEU NEGATIVE 04/19/2019 1208   HGBUR TRACE (A) 01/25/2021 1228   BILIRUBINUR NEGATIVE 01/25/2021 1228   KETONESUR NEGATIVE 01/25/2021 1228   PROTEINUR NEGATIVE 01/25/2021 1228   UROBILINOGEN 0.2 04/19/2019 1208   NITRITE NEGATIVE 01/25/2021 1228   LEUKOCYTESUR NEGATIVE 01/25/2021 1228    STUDIES: No results found.   ELIGIBLE FOR AVAILABLE RESEARCH PROTOCOL: no  ASSESSMENT: 76 y.o. [place] woman status post left breast upper outer quadrant biopsy 06/24/2017 for a clinical T1b N0, stage IA invasive ductal carcinoma, grade 1, estrogen and progesterone receptor positive, HER-2 not amplified, with an MIB-105%  (1) status post left lumpectomy without sentinel lymph node sampling on 08/10/2017 showed invasive ductal carcinoma grade II, spanning 1.2 cm. High grade DCIS. Anterior margin was was focally positive for invasive ductal carcinoma. All other margins clear.   (a) additional surgery for margin clearance 08/31/2017 showed atypical ductal hyperplasia, negative margins  (2) given the patient's age, the tumor size, grade and prognostic profile, will forego Oncotype and avoid chemotherapy  (3) may forgo adjuvant radiation if comfortable with anti-estrogen therapy  (4) anastrozole started 08/30/2017  (5) osteoporosis: bone density on 02/27/2014 showed a T score of -2.9   (a) bone density on 12/08/2016 showed a T score of -2.4 (osteopenia).  (b) bone density 04/10/2019 shows a T score of -2.2  (6) lung cancer screening: Initiated June 2019  (a) most recent scan 07/15/2020 benign  (b)  incidentally noted adrenal adenomas stable   PLAN:  She is tolerating anastrozole well and the plan is to continue that a total of 5 years.  She will complete 5 years of antiestrogen  therapy in April 2024. She receives Prolia through her primary care physician and that is helping her bone density, which is improved.  She might be due for a bone density but this is being managed by her PCP.  She will check in with her primary care physician.  She denies any new dental concerns. She has excellent pulmonary follow-up through Dr. Marchelle Gearing for her lung issues, no change reported. We have reviewed labs today which do not show any remarkable concerns, slight drop in hemoglobin, will monitor. She reported some blood in stool.  Encouraged her to call her PCP and discuss about the blood in stool.  She expressed understanding. She will see [place] again in 1 year.  She knows to call for any other issue that may develop before then  Total  encounter time 30 minutes.*  *Total Encounter Time as defined by the Centers for Medicare and Medicaid Services includes, in addition to the face-to-face time of a patient visit (documented in the note above) non-face-to-face time: obtaining and reviewing outside history, ordering and reviewing medications, tests or procedures, care coordination (communications with other health care professionals or caregivers) and documentation in the medical record.

## 2021-11-06 ENCOUNTER — Other Ambulatory Visit: Payer: Self-pay | Admitting: Cardiovascular Disease

## 2021-11-06 ENCOUNTER — Telehealth: Payer: Self-pay | Admitting: Cardiovascular Disease

## 2021-11-06 ENCOUNTER — Telehealth: Payer: Self-pay | Admitting: Hematology and Oncology

## 2021-11-06 DIAGNOSIS — I2510 Atherosclerotic heart disease of native coronary artery without angina pectoris: Secondary | ICD-10-CM

## 2021-11-06 DIAGNOSIS — E785 Hyperlipidemia, unspecified: Secondary | ICD-10-CM

## 2021-11-06 DIAGNOSIS — I10 Essential (primary) hypertension: Secondary | ICD-10-CM

## 2021-11-06 DIAGNOSIS — E876 Hypokalemia: Secondary | ICD-10-CM

## 2021-11-06 MED ORDER — CARVEDILOL 6.25 MG PO TABS: 6.2500 mg | ORAL_TABLET | Freq: Two times a day (BID) | ORAL | 0 refills | Status: DC

## 2021-11-10 ENCOUNTER — Encounter: Payer: Self-pay | Admitting: Physician Assistant

## 2021-11-10 ENCOUNTER — Ambulatory Visit (INDEPENDENT_AMBULATORY_CARE_PROVIDER_SITE_OTHER): Payer: Medicare Other | Admitting: Physician Assistant

## 2021-11-10 VITALS — BP 138/62 | HR 80 | Ht 64.5 in | Wt 201.0 lb

## 2021-11-10 DIAGNOSIS — I10 Essential (primary) hypertension: Secondary | ICD-10-CM | POA: Diagnosis not present

## 2021-11-10 DIAGNOSIS — E785 Hyperlipidemia, unspecified: Secondary | ICD-10-CM

## 2021-11-10 DIAGNOSIS — J9611 Chronic respiratory failure with hypoxia: Secondary | ICD-10-CM

## 2021-11-10 DIAGNOSIS — Z853 Personal history of malignant neoplasm of breast: Secondary | ICD-10-CM

## 2021-11-10 DIAGNOSIS — I2510 Atherosclerotic heart disease of native coronary artery without angina pectoris: Secondary | ICD-10-CM

## 2021-11-10 DIAGNOSIS — Z9981 Dependence on supplemental oxygen: Secondary | ICD-10-CM

## 2021-11-10 DIAGNOSIS — J449 Chronic obstructive pulmonary disease, unspecified: Secondary | ICD-10-CM

## 2021-11-11 DIAGNOSIS — G4733 Obstructive sleep apnea (adult) (pediatric): Secondary | ICD-10-CM | POA: Diagnosis not present

## 2021-11-11 DIAGNOSIS — J449 Chronic obstructive pulmonary disease, unspecified: Secondary | ICD-10-CM | POA: Diagnosis not present

## 2021-11-11 DIAGNOSIS — I10 Essential (primary) hypertension: Secondary | ICD-10-CM | POA: Diagnosis not present

## 2021-11-11 LAB — LIPID PANEL
Chol/HDL Ratio: 2.2 ratio (ref 0.0–4.4)
Cholesterol, Total: 110 mg/dL (ref 100–199)
HDL: 51 mg/dL (ref >39)
LDL Chol Calc (NIH): 40 mg/dL (ref 0–99)
Triglycerides: 102 mg/dL (ref 0–149)
VLDL Cholesterol Cal: 19 mg/dL (ref 5–40)

## 2021-11-24 ENCOUNTER — Telehealth: Payer: Self-pay

## 2021-11-24 NOTE — Telephone Encounter (Signed)
Dawn is calling to report Rectal bleeding about 1 mo ago. Pt was taking a supplement and drinking Alkaline water. Pt stopped those 2 and the bleeding has since subside.  Pt has appt 19th. Pt wasn't really concerned at this time per [facility].  Dawn states that if you need to reach pt or have additional questions please contact pt.  Pt CB [phone]  FYI

## 2021-11-26 ENCOUNTER — Telehealth: Payer: Self-pay | Admitting: Internal Medicine

## 2021-11-27 NOTE — Telephone Encounter (Signed)
Called patient and left voicemail for her to call office back. We are needing more details from her in regards to the scooter

## 2021-11-27 NOTE — Telephone Encounter (Signed)
Can we send order that we have on file for the mobility scooter to [employer] due to insurance purposes.   Thank you ladies

## 2021-11-27 NOTE — Telephone Encounter (Signed)
Can we get a new order for this and I believe there has to be more detail for the insurance to pay for it

## 2021-11-30 ENCOUNTER — Other Ambulatory Visit: Payer: Self-pay | Admitting: Internal Medicine

## 2021-11-30 ENCOUNTER — Telehealth: Payer: Commercial Managed Care - HMO

## 2021-12-02 ENCOUNTER — Ambulatory Visit (INDEPENDENT_AMBULATORY_CARE_PROVIDER_SITE_OTHER): Payer: Medicare Other

## 2021-12-02 ENCOUNTER — Other Ambulatory Visit: Payer: Self-pay | Admitting: Cardiovascular Disease

## 2021-12-02 ENCOUNTER — Other Ambulatory Visit: Payer: Self-pay | Admitting: Adult Health

## 2021-12-02 DIAGNOSIS — M810 Age-related osteoporosis without current pathological fracture: Secondary | ICD-10-CM

## 2021-12-02 DIAGNOSIS — E785 Hyperlipidemia, unspecified: Secondary | ICD-10-CM

## 2021-12-02 DIAGNOSIS — E876 Hypokalemia: Secondary | ICD-10-CM

## 2021-12-02 DIAGNOSIS — I10 Essential (primary) hypertension: Secondary | ICD-10-CM

## 2021-12-02 DIAGNOSIS — I2510 Atherosclerotic heart disease of native coronary artery without angina pectoris: Secondary | ICD-10-CM

## 2021-12-02 MED ORDER — DENOSUMAB 60 MG/ML ~~LOC~~ SOSY
60.0000 mg | PREFILLED_SYRINGE | Freq: Once | SUBCUTANEOUS | Status: AC
  Administered 2021-12-02: 60 mg via SUBCUTANEOUS

## 2021-12-02 NOTE — Progress Notes (Signed)
After obtaining consent, and per orders of Dr. Ronnald Ramp, injection of Prolia given on the right arm subcut by Marrian Salvage. Patient tolerated well and instructed to report any adverse reaction to me immediately.

## 2021-12-03 ENCOUNTER — Other Ambulatory Visit: Payer: Self-pay | Admitting: *Deleted

## 2021-12-03 MED ORDER — ANASTROZOLE 1 MG PO TABS: 1.0000 mg | ORAL_TABLET | Freq: Every day | ORAL | 3 refills | Status: DC

## 2021-12-09 NOTE — Telephone Encounter (Signed)
Last Prolia inj 12/02/21 Next Prolia inj due 06/05/22  Prior Auth: APPROVED PA# [id number] Valid: 06/23/21-06/23/22

## 2021-12-11 DIAGNOSIS — J449 Chronic obstructive pulmonary disease, unspecified: Secondary | ICD-10-CM | POA: Diagnosis not present

## 2021-12-11 DIAGNOSIS — I10 Essential (primary) hypertension: Secondary | ICD-10-CM | POA: Diagnosis not present

## 2021-12-11 DIAGNOSIS — G4733 Obstructive sleep apnea (adult) (pediatric): Secondary | ICD-10-CM | POA: Diagnosis not present

## 2021-12-14 ENCOUNTER — Ambulatory Visit
Admission: RE | Admit: 2021-12-14 | Discharge: 2021-12-14 | Disposition: A | Discharge status: Home / Self Care | Payer: Medicare Other | Source: Ambulatory Visit | Attending: Hematology and Oncology | Admitting: Hematology and Oncology

## 2021-12-14 DIAGNOSIS — Z853 Personal history of malignant neoplasm of breast: Secondary | ICD-10-CM | POA: Diagnosis not present

## 2021-12-14 DIAGNOSIS — C50412 Malignant neoplasm of upper-outer quadrant of left female breast: Secondary | ICD-10-CM

## 2021-12-14 DIAGNOSIS — R928 Other abnormal and inconclusive findings on diagnostic imaging of breast: Secondary | ICD-10-CM | POA: Diagnosis not present

## 2021-12-15 NOTE — Telephone Encounter (Signed)
Prolia VOB initiated via [email or web address]  Last Prolia inj 12/02/21 Next Prolia inj due 06/05/22   Prior Auth: APPROVED PA# [id number] Valid: 06/23/21-06/23/22

## 2021-12-24 ENCOUNTER — Telehealth: Payer: Self-pay | Admitting: Internal Medicine

## 2021-12-24 NOTE — Telephone Encounter (Deleted)
Prior auth required for PROLIA  PA PROCESS DETAILS: Effective 05/17/2021 if the patient is new to Prolia, Prior authorization and Step Therapy are required & not on file. Please go to [email or web address] or call [phone] to initiate the prior authorization. For exception to the policy please visit [email or web address] and review Policy Number [id number] 

## 2021-12-24 NOTE — Telephone Encounter (Signed)
I would recommend she COVID test given URI symptoms and notify us if positive. Avoid blowing her nose and may need to hold her baby aspirin for a day or two if she is having bloody nasal discharge. If she develops nose bleed that won't stop, go to ED. If breathing is stable and no wheezing, would not recommend any prednisone at this time. It is likely viral and should clear on it's own. Continue mucinex; can use 1200 mg Twice daily and OTC allergy relief. Continue Trelegy, daliresp and albuterol PRN. If no improvement or if symptoms worsen, please have her call back for OV.

## 2021-12-24 NOTE — Telephone Encounter (Signed)
Called patient but she did not answer. Left message for her to call back.  

## 2021-12-24 NOTE — Telephone Encounter (Signed)
Called and spoke with patient. She stated that has been coughing for the past week as well as having a runny nose. The cough has been productive with clear phlegm. At times her nasal discharge has been bloody but she believes this is coming from the irritation from her oxygen. Increased fatigue. Denied any wheezing or fever. She has been taking Mucinex and OTC allergy relief.   She confirmed she is still using her Trelegy, Daliresp and albuterol.   She is concerned because she has COPD. She wants to know if she needs to have abx and prednisone sent in for her.   Pharmacy is [facility]   Joellen Jersey, can you please advise since MR is not available today? Thanks!

## 2021-12-25 NOTE — Telephone Encounter (Signed)
I gave her the recommendations and she is going to call back if she is positive for Covid. Nothing further needed.

## 2021-12-25 NOTE — Telephone Encounter (Signed)
Patient is returning phone call. Patient phone number is [phone].

## 2021-12-30 ENCOUNTER — Telehealth: Payer: Self-pay | Admitting: Internal Medicine

## 2021-12-30 NOTE — Telephone Encounter (Signed)
Are you ok with a MyChart video as she is covid positive? Please advise?

## 2021-12-30 NOTE — Progress Notes (Unsigned)
Error

## 2021-12-30 NOTE — Telephone Encounter (Signed)
Patient is still coughing, she doesn't think she been running a fever but when/if she puts her arms under her breast it does feel really warm. She's also not sure if she has covid because she didn't get any results. She's going to try and go to [facility] and get another covid test.

## 2021-12-30 NOTE — Telephone Encounter (Signed)
Pt needs to be seen for an acute visit as she has not been in office since 11/2020.  Dr. Annamaria Boots, please advise if you are okay with [place] using a held slot tomorrow to get pt in so we can evaluate to see what might be going on.

## 2021-12-30 NOTE — Telephone Encounter (Signed)
Waiting to confirm with patient for a Mychart video.

## 2021-12-30 NOTE — Telephone Encounter (Signed)
[place] My Chart

## 2021-12-30 NOTE — Telephone Encounter (Signed)
Ok held spot for acute visit tomorrow

## 2021-12-31 ENCOUNTER — Telehealth: Payer: Self-pay | Admitting: *Deleted

## 2021-12-31 ENCOUNTER — Telehealth (INDEPENDENT_AMBULATORY_CARE_PROVIDER_SITE_OTHER): Payer: Medicare Other | Admitting: Adult Health

## 2021-12-31 DIAGNOSIS — U071 COVID-19: Secondary | ICD-10-CM | POA: Diagnosis not present

## 2021-12-31 DIAGNOSIS — J449 Chronic obstructive pulmonary disease, unspecified: Secondary | ICD-10-CM

## 2021-12-31 DIAGNOSIS — J9611 Chronic respiratory failure with hypoxia: Secondary | ICD-10-CM

## 2021-12-31 MED ORDER — AZITHROMYCIN 250 MG PO TABS: ORAL_TABLET | ORAL | 0 refills | Status: AC

## 2021-12-31 MED ORDER — ALBUTEROL SULFATE (2.5 MG/3ML) 0.083% IN NEBU: 2.5000 mg | INHALATION_SOLUTION | Freq: Four times a day (QID) | RESPIRATORY_TRACT | 3 refills | Status: DC | PRN

## 2021-12-31 MED ORDER — PREDNISONE 20 MG PO TABS: 20.0000 mg | ORAL_TABLET | Freq: Every day | ORAL | 0 refills | Status: DC

## 2021-12-31 NOTE — Telephone Encounter (Signed)
[facility] and left a vm as they were closed letting them know that she needed her prescriptions delivered as she is quarantined at home.  Left phone number for office with any questions and also stated they could contacted the patient as well.

## 2021-12-31 NOTE — Telephone Encounter (Signed)
Mychart visit scheduled by Dessie Coma, [place].

## 2021-12-31 NOTE — Patient Instructions (Addendum)
Zpack take as directed. Take with food.  Prednisone '20mg'$  daily for 5 days. Take with food.  Continue on TRELEGY 1 puff daily , rinse after use.  Albuterol inhaler or nebs As needed   Continue on Daliresp daily  Activity as tolerated.  Albuterol inhaler or neb As needed   Continue Oxygen 2l/m . (3l/m on portable system)  Follow up with Dr. Chase Caller or Willma Obando NP in 3-4 weeks and As needed  Please contact office for sooner follow up if symptoms do not improve or worsen or seek emergency care

## 2021-12-31 NOTE — Progress Notes (Signed)
Virtual Visit via Video Note  I connected with Victoria [name] on 12/31/21 at  4:30 PM EDT by a video enabled telemedicine application and verified that I am speaking with the correct person using two identifiers.  Location: Patient: Home  Provider: Office    I discussed the limitations of evaluation and management by telemedicine and the availability of in person appointments. The patient expressed understanding and agreed to proceed.  History of Present Illness: 76 yo female former smoker followed for COPD and O2 RF  Medical hx- Breast cancer 2019 s/p lumpectomy , DM   Today's video visit is for an acute office for Covid 19 infection. Says she went to family reunion 2 weeks ago in [place]. Flew there . Came home with cold symptoms.  Complains of ongoing symptoms with cough, congestion, thick mucus, shortness of breath.  Tested initially at home for Covid was negative. Tested yesterday with positive. Did have episode of n/v when symptoms first started unsure if related to Covid or because she at a lot of food at picnic and drink beer. No further episodes.  Has Covid vaccine x 4 .  Remains on TRELEGY daily. Daliresp daily .  Remains on Oxygen 2l/m . O2 sats at home on O2 at 96-98%.  No hemoptysis , chest pain. Mucus is clear. Eating well with no n/v/d.   Past Medical History:  Diagnosis Date   Abdominal aneurysm ([place])    Dr. Oneida Alar follows lLOV 2 ''17 per pt "around 2 cm"   Anemia    as a child   Arthritis    left ankle, right knee, right SI joint, wrists, lower back   Breast cancer in female [facility])    Left   COPD (chronic obstructive pulmonary disease) ([place])    ephysema-Dr. 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 Harlow Asa in [facility]   Sleep apnea    cpap used sometimes, uses oxygen concentrator 2 l/m nasally bedtime   Varicella as child   Current Outpatient Medications on File Prior to Visit  Medication Sig Dispense Refill   ACCU-CHEK AVIVA PLUS test strip USE ONE strip TO check glucose THREE TIMES DAILY 300 strip 2   Accu-Chek Softclix Lancets lancets USE AS DIRECTED ONCE DAILY 100 each 1   Albuterol Sulfate (PROAIR RESPICLICK) 588 (90 Base) MCG/ACT AEPB Inhale 1-2 puffs into the lungs every 6 (six) hours as needed (for wheezing/shortness of breath). 1 each 5   amLODipine (NORVASC) 10 MG tablet TAKE ONE TABLET BY MOUTH ONCE DAILY 90 tablet 0   anastrozole (ARIMIDEX) 1 MG tablet Take 1 tablet (1 mg total) by mouth daily. 90 tablet 3   Blood Glucose Monitoring Suppl (ACCU-CHEK AVIVA PLUS) w/Device KIT 1 Device by Does not apply route daily. Use to monitor glucose levels once per day; E11.9 1 kit 0   carvedilol (COREG) 6.25 MG tablet TAKE ONE TABLET BY MOUTH AT BREAKFAST AND AT BEDTIME 180 tablet 0   Cholecalciferol (  VITAMIN D) 125 MCG (5000 UT) CAPS Take 1 capsule by mouth daily.     DALIRESP 500 MCG TABS tablet TAKE ONE TABLET BY MOUTH EVERY MORNING 90 tablet 3   denosumab (PROLIA) 60 MG/ML SOLN injection Inject 60 mg into the skin every 6 (six) months. Administer in upper arm, thigh, or abdomen     glucose blood (ACCU-CHEK GUIDE) test strip USE 1 STRIP ONCE DAILY 100 each 0   guaiFENesin (MUCINEX) 600 MG 12 hr tablet Take 1,200 mg by mouth 2 (two) times daily.     JANUMET XR 50-1000 MG TB24 TAKE TWO TABLETS BY MOUTH ONCE DAILY 180 tablet 3   OXYGEN Inhale 2-3 L into the lungs continuous. When exerting self     potassium chloride SA (KLOR-CON) 20 MEQ tablet Take 1 tablet (20 mEq total) by mouth 2 (two) times daily. 180 tablet 0   rosuvastatin (CRESTOR) 40 MG tablet  TAKE ONE TABLET BY MOUTH EVERYDAY AT BEDTIME 90 tablet 0   spironolactone (ALDACTONE) 25 MG tablet TAKE ONE TABLET BY MOUTH ONCE DAILY 90 tablet 0   thiamine (VITAMIN B-1) 100 MG tablet Take 1 tablet (100 mg total) by mouth every other day. 45 tablet 1   TRELEGY ELLIPTA 100-62.5-25 MCG/ACT AEPB Inhale ONE PUFF into THE lungs daily 60 each 5   acetaminophen (TYLENOL) 500 MG tablet Take 1,000 mg by mouth every 6 (six) hours as needed. (Patient not taking: Reported on 12/31/2021)     albuterol (PROVENTIL) (2.5 MG/3ML) 0.083% nebulizer solution Take 3 mLs (2.5 mg total) by nebulization every 6 (six) hours as needed for wheezing or shortness of breath. (Patient not taking: Reported on 12/31/2021) 75 mL 3   aspirin EC 81 MG tablet Take 1 tablet (81 mg total) by mouth daily. (Patient not taking: Reported on 12/31/2021) 90 tablet 3   glucose blood test strip USE 1 STRIP ONCE DAILY (Patient not taking: Reported on 12/31/2021) 100 each 12   ibuprofen (ADVIL) 400 MG tablet Take 1 tablet (400 mg total) by mouth every 6 (six) hours as needed. (Patient not taking: Reported on 11/10/2021) 30 tablet 0   No current facility-administered medications on file prior to visit.       Observations/Objective: L/47% in 10/21/2015 and 0.69 L/37% later in June 2017 CT chest lung cancer screening 10/09/2015: Emphysema with very small lung nodules Echo January 2016: Shows grade 1 diastolic dysfunction Nuclear medicine cardiac stress to 12/11/2015: Normal tests Lexiscan Myoview with ejection fraction 75% PSG 12/2014 >AHI 35/h  12/2018 Lung-RADS 2, benign appearance or behavior. Continue annual screening with low-dose chest CT without contrast in 12 months. - LDCT 09/2021 - Emphysema, tiny nodules , no suspicious nodules . Lung RADS 2 -benign    Assessment and Plan: Covid 19 Infection - Symptoms >10 days - not candidate for antiviral  Continue symptom management - tx for secondary copd flare   COPD Exacerbation -tx w/  empiric antibiotics/steroids   O2 RF - no increased O2 demands Cont on O2 to keep sats >88-90%   Plan  Patient Instructions  Zpack take as directed. Take with food.  Prednisone 62m daily for 5 days. Take with food.  Continue on TRELEGY 1 puff daily , rinse after use.  Albuterol inhaler or nebs As needed   Continue on Daliresp daily  Activity as tolerated.  Albuterol inhaler or neb As needed   Continue oxygen 2l/m . (3l/m on portable system)  Follow up with Dr. RChase Calleror Kayman Snuffer NP in 3-4  weeks and As needed       Follow Up Instructions:    I discussed the assessment and treatment plan with the patient. The patient was provided an opportunity to ask questions and all were answered. The patient agreed with the plan and demonstrated an understanding of the instructions.   The patient was advised to call back or seek an in-person evaluation if the symptoms worsen or if the condition fails to improve as anticipated.  I provided 30  minutes of non-face-to-face time during this encounter.   Rexene Edison, NP

## 2022-01-01 ENCOUNTER — Telehealth: Payer: Self-pay | Admitting: Adult Health

## 2022-01-01 NOTE — Telephone Encounter (Signed)
Called patient back and she did inform us that she [facility] and [facility] are delivering her medications this morning. Nothing further needed

## 2022-01-06 NOTE — Telephone Encounter (Signed)
Called and spoke with pt who states she finished prednisone and abx 8/22. Pt said she woke up overnight broke out in a sweat. Due to waking up that way, she did take some OTC arthritis tylenol.  Pt said she is still experiencing symptoms and states she is still taking nasal saline and will take mucinex as needed.  Pt said that she has been trying to do daily activities and I stated to her that rest is the best way to get her feeling better and also keeping fluids in her. Pt verbalized understanding. Have scheduled pt an in-office visit with TP to further assess to see how she is doing post-covid. Nothing further needed.

## 2022-01-11 ENCOUNTER — Telehealth: Payer: Self-pay | Admitting: Adult Health

## 2022-01-11 DIAGNOSIS — J449 Chronic obstructive pulmonary disease, unspecified: Secondary | ICD-10-CM | POA: Diagnosis not present

## 2022-01-11 DIAGNOSIS — I10 Essential (primary) hypertension: Secondary | ICD-10-CM | POA: Diagnosis not present

## 2022-01-11 DIAGNOSIS — G4733 Obstructive sleep apnea (adult) (pediatric): Secondary | ICD-10-CM | POA: Diagnosis not present

## 2022-01-14 ENCOUNTER — Encounter: Payer: Self-pay | Admitting: Adult Health

## 2022-01-14 ENCOUNTER — Ambulatory Visit (INDEPENDENT_AMBULATORY_CARE_PROVIDER_SITE_OTHER): Payer: Medicare Other | Admitting: Adult Health

## 2022-01-14 DIAGNOSIS — U071 COVID-19: Secondary | ICD-10-CM | POA: Insufficient documentation

## 2022-01-14 DIAGNOSIS — J449 Chronic obstructive pulmonary disease, unspecified: Secondary | ICD-10-CM

## 2022-01-14 DIAGNOSIS — J9611 Chronic respiratory failure with hypoxia: Secondary | ICD-10-CM | POA: Diagnosis not present

## 2022-01-14 NOTE — Assessment & Plan Note (Signed)
Recent covid 19 infection - clinically improving  Activity as tolerated.   Plan  Patient Instructions  Continue on TRELEGY 1 puff daily , rinse after use.  Albuterol inhaler or nebs As needed   Continue on Daliresp daily  Activity as tolerated.  Albuterol inhaler or neb As needed   Continue Oxygen 2l/m . (3l/m on portable system)  Follow up with Dr. Chase Caller or Janissa Bertram NP in 3-4 months and As needed  Please contact office for sooner follow up if symptoms do not improve or worsen or seek emergency care

## 2022-01-14 NOTE — Patient Instructions (Signed)
Continue on TRELEGY 1 puff daily , rinse after use.  Albuterol inhaler or nebs As needed   Continue on Daliresp daily  Activity as tolerated.  Albuterol inhaler or neb As needed   Continue Oxygen 2l/m . (3l/m on portable system)  Follow up with Dr. Chase Caller or Dahl Higinbotham NP in 3-4 months and As needed  Please contact office for sooner follow up if symptoms do not improve or worsen or seek emergency care

## 2022-01-14 NOTE — Assessment & Plan Note (Signed)
Continue on o2 to keep O2 sats >88-90%.   Plan  Patient Instructions  Continue on TRELEGY 1 puff daily , rinse after use.  Albuterol inhaler or nebs As needed   Continue on Daliresp daily  Activity as tolerated.  Albuterol inhaler or neb As needed   Continue Oxygen 2l/m . (3l/m on portable system)  Follow up with Dr. Chase Caller or Aubri Gathright NP in 3-4 months and As needed  Please contact office for sooner follow up if symptoms do not improve or worsen or seek emergency care

## 2022-01-14 NOTE — Progress Notes (Signed)
_0  ID: Victoria [name], female    DOB: September 03, 1945, 76 y.o.   MRN: [id number]  Chief Complaint  Patient presents with   Follow-up    Pt states she is still feeling fatigue after Covid.     Referring provider: Janith Lima, MD  HPI: 76 year old female former smoker followed for COPD and oxygen dependent respiratory failure Medical history significant for breast cancer 2019 status post lumpectomy and diabetes OSA-CPAP intolerant   TEST/EVENTS :   01/14/2022 Follow up : Covid 19 infection , COPD , O2 RF  Patient presents for 2-week follow-up.  Patient was seen last visit via virtual visit.  She had tested positive for COVID-19 infection after going to a family reunion.  She was outside the window for antiviral therapy.  She was treated for an COPD exacerbation with a Z-Pak and prednisone taper.  Patient says since last visit she is feeling better.  She has decreased cough congestion.  Still remains tired and fatigued.  She remains on Trelegy inhaler daily.  She is on Daliresp daily.  She denies any hemoptysis, chest pain, orthopnea, increased edema.  She remains on oxygen 2 L.  Denies any increased oxygen demands. Uses a POC with 3l/m pulsed oxygen.  Eating well. No n/v/d. Remains tired with low energy.  Participates in Lung cancer screening program . CT chest 09/21/21 Lung Rads 2 , Emphysema , stable tiny nodules. No suspicious nodules .  Still working to get scooter has hard time getting around. Gets winded with minimal activity  Also looking at lighter weight POC as simply go is too heavy .    Allergies  Allergen Reactions   Benicar [Olmesartan] Swelling    Swelling of face and arms    [name] [Valsartan] Swelling    Swelling of face and arms    Hydrocodone-Acetaminophen Nausea And Vomiting    Severe vomiting   Lisinopril Cough   Monosodium Glutamate Other (See Comments)    Facial swelling per pt   Codeine Other (See Comments)    jittery   Lead Acetate Rash   Nickel  Rash    Severe rash to infection: pt is allergic to all metals other than sterling silver or gold jewelry.     Immunization History  Administered Date(s) Administered   Fluad Quad(high Dose 65+) 01/17/2019, 04/30/2020, 02/23/2021   Influenza Split 01/16/2011   Influenza, High Dose Seasonal PF 01/13/2017, 02/09/2018   Influenza,inj,Quad PF,6+ Mos 02/01/2013, 02/27/2014, 04/07/2015, 01/08/2016   PFIZER(Purple Top)SARS-COV-2 Vaccination 06/06/2019, 06/27/2019, 05/21/2020   Pneumococcal Conjugate-13 02/01/2013   Pneumococcal Polysaccharide-23 10/29/2014, 06/03/2021   Td 12/06/2011   Zoster Recombinat (Shingrix) 11/21/2019, 05/12/2020    Past Medical History:  Diagnosis Date   Abdominal aneurysm ([place])    Dr. Oneida Alar follows lLOV 2 ''17 per pt "around 2 cm"   Anemia    as a child   Arthritis    left ankle, right knee, right SI joint, wrists, lower back   Breast cancer in female [facility])    Left   COPD (chronic obstructive pulmonary disease) ([place])    ephysema-Dr. Chase Caller   Dyspnea    Headache    as a child would have terrible headaches during season changes   Heart murmur    congenital, 2 D echo '10   Hyperlipidemia    Hypertension    Multiple thyroid nodules    Murmur, cardiac 1950   Osteoporosis    Pneumonia    Pre-diabetes    Requires continuous at home supplemental  oxygen    2 L 24/7   Sebaceous cyst    hairline sebaceous cyst left posterior neck to be excised 04-13-16 by Dr. Harlow Asa in [facility]   Sleep apnea    cpap used sometimes, uses oxygen concentrator 2 l/m nasally bedtime   Varicella as child    Tobacco History: Social History   Tobacco Use  Smoking Status Former   Packs/day: 1.00   Years: 50.00   Total pack years: 50.00   Types: Cigarettes   Quit date: 04/16/2011   Years since quitting: 10.7  Smokeless Tobacco Never  Tobacco Comments   quit that date when she had to go to ER    Counseling given: Not Answered Tobacco comments: quit that date  when she had to go to ER    Outpatient Medications Prior to Visit  Medication Sig Dispense Refill   ACCU-CHEK AVIVA PLUS test strip USE ONE strip TO check glucose THREE TIMES DAILY 300 strip 2   Accu-Chek Softclix Lancets lancets USE AS DIRECTED ONCE DAILY 100 each 1   acetaminophen (TYLENOL) 500 MG tablet Take 1,000 mg by mouth every 6 (six) hours as needed.     albuterol (PROVENTIL) (2.5 MG/3ML) 0.083% nebulizer solution Take 3 mLs (2.5 mg total) by nebulization every 6 (six) hours as needed for wheezing or shortness of breath. 75 mL 3   Albuterol Sulfate (PROAIR RESPICLICK) 166 (90 Base) MCG/ACT AEPB Inhale 1-2 puffs into the lungs every 6 (six) hours as needed (for wheezing/shortness of breath). 1 each 5   amLODipine (NORVASC) 10 MG tablet TAKE ONE TABLET BY MOUTH ONCE DAILY 90 tablet 0   anastrozole (ARIMIDEX) 1 MG tablet Take 1 tablet (1 mg total) by mouth daily. 90 tablet 3   aspirin EC 81 MG tablet Take 1 tablet (81 mg total) by mouth daily. 90 tablet 3   Blood Glucose Monitoring Suppl (ACCU-CHEK AVIVA PLUS) w/Device KIT 1 Device by Does not apply route daily. Use to monitor glucose levels once per day; E11.9 1 kit 0   carvedilol (COREG) 6.25 MG tablet TAKE ONE TABLET BY MOUTH AT BREAKFAST AND AT BEDTIME 180 tablet 0   Cholecalciferol (VITAMIN D) 125 MCG (5000 UT) CAPS Take 1 capsule by mouth daily.     DALIRESP 500 MCG TABS tablet TAKE ONE TABLET BY MOUTH EVERY MORNING 90 tablet 3   denosumab (PROLIA) 60 MG/ML SOLN injection Inject 60 mg into the skin every 6 (six) months. Administer in upper arm, thigh, or abdomen     glucose blood (ACCU-CHEK GUIDE) test strip USE 1 STRIP ONCE DAILY 100 each 0   glucose blood test strip USE 1 STRIP ONCE DAILY 100 each 12   guaiFENesin (MUCINEX) 600 MG 12 hr tablet Take 1,200 mg by mouth 2 (two) times daily.     ibuprofen (ADVIL) 400 MG tablet Take 1 tablet (400 mg total) by mouth every 6 (six) hours as needed. 30 tablet 0   JANUMET XR 50-1000 MG TB24  TAKE TWO TABLETS BY MOUTH ONCE DAILY 180 tablet 3   OXYGEN Inhale 2-3 L into the lungs continuous. When exerting self     potassium chloride SA (KLOR-CON) 20 MEQ tablet Take 1 tablet (20 mEq total) by mouth 2 (two) times daily. 180 tablet 0   predniSONE (DELTASONE) 20 MG tablet Take 1 tablet (20 mg total) by mouth daily with breakfast. 5 tablet 0   rosuvastatin (CRESTOR) 40 MG tablet TAKE ONE TABLET BY MOUTH EVERYDAY AT BEDTIME 90  tablet 0   spironolactone (ALDACTONE) 25 MG tablet TAKE ONE TABLET BY MOUTH ONCE DAILY 90 tablet 0   thiamine (VITAMIN B-1) 100 MG tablet Take 1 tablet (100 mg total) by mouth every other day. 45 tablet 1   TRELEGY ELLIPTA 100-62.5-25 MCG/ACT AEPB Inhale ONE PUFF into THE lungs daily 60 each 5   No facility-administered medications prior to visit.     Review of Systems:   Constitutional:   No  weight loss, night sweats,  Fevers, chills,  +fatigue, or  lassitude.  HEENT:   No headaches,  Difficulty swallowing,  Tooth/dental problems, or  Sore throat,                No sneezing, itching, ear ache, nasal congestion, post nasal drip,   CV:  No chest pain,  Orthopnea, PND, swelling in lower extremities, anasarca, dizziness, palpitations, syncope.   GI  No heartburn, indigestion, abdominal pain, nausea, vomiting, diarrhea, change in bowel habits, loss of appetite, bloody stools.   Resp:   No chest wall deformity  Skin: no rash or lesions.  GU: no dysuria, change in color of urine, no urgency or frequency.  No flank pain, no hematuria   MS:  No joint pain or swelling.  No decreased range of motion.  No back pain.    Physical Exam  BP (!) 128/58 (BP Location: Left Arm, Patient Position: Sitting, Cuff Size: Large)   Pulse 87   Temp 98.8 F (37.1 C) (Oral)   Ht 5' 4" (1.626 m)   Wt 201 lb 12.8 oz (91.5 kg)   SpO2 97%   BMI 34.64 kg/m   GEN: A/Ox3; pleasant , NAD, chronically ill appearing , on O2 , in wc    HEENT:  [place]/AT,  NOSE-clear, THROAT-clear, no  lesions, no postnasal drip or exudate noted.   NECK:  Supple w/ fair ROM; no JVD; normal carotid impulses w/o bruits; no thyromegaly or nodules palpated; no lymphadenopathy.    RESP  Clear  P & A; w/o, wheezes/ rales/ or rhonchi. no accessory muscle use, no dullness to percussion  CARD:  RRR, no m/r/g, tr peripheral edema, pulses intact, no cyanosis or clubbing.  GI:   Soft & nt; nml bowel sounds; no organomegaly or masses detected.   Musco: Warm bil, no deformities or joint swelling noted.   Neuro: alert, no focal deficits noted.    Skin: Warm, no lesions or rashes    Lab Results:    BNP No results found for: "BNP"  ProBNP No results found for: "PROBNP"  Imaging: No results found.  denosumab (PROLIA) injection 60 mg     Date Action Dose Route User   12/02/2021 1437 Given 60 mg Subcutaneous (Right Arm) Marrian Salvage, CMA          Latest Ref Rng & Units 04/15/2016    1:02 PM 11/05/2015   12:08 PM 10/21/2015   10:56 AM  PFT Results  FVC-Pre L 1.41  1.50  1.66   FVC-Predicted Pre % 58  63  69   FVC-Post L 1.38  1.50  1.83   FVC-Predicted Post % 57  63  77   Pre FEV1/FVC % % 45  41  44   Post FEV1/FCV % % 47  46  48   FEV1-Pre L 0.64  0.62  0.73   FEV1-Predicted Pre % 34  33  39   FEV1-Post L 0.65  0.69  0.88   DLCO uncorrected ml/min/mmHg 9.45  DLCO UNC% % 36     DLCO corrected ml/min/mmHg 9.47     DLCO COR %Predicted % 37     DLVA Predicted % 60       No results found for: "NITRICOXIDE"      Assessment & Plan:   COPD, severe (HCC) Recent flare with Covid 19 infection-resolving . Continue on current maintenance regimen   Plan  Patient Instructions  Continue on TRELEGY 1 puff daily , rinse after use.  Albuterol inhaler or nebs As needed   Continue on Daliresp daily  Activity as tolerated.  Albuterol inhaler or neb As needed   Continue Oxygen 2l/m . (3l/m on portable system)  Follow up with Dr. Chase Caller or Amahd Morino NP in 3-4 months and As  needed  Please contact office for sooner follow up if symptoms do not improve or worsen or seek emergency care      Chronic respiratory failure with hypoxia ([place]) Continue on o2 to keep O2 sats >88-90%.   Plan  Patient Instructions  Continue on TRELEGY 1 puff daily , rinse after use.  Albuterol inhaler or nebs As needed   Continue on Daliresp daily  Activity as tolerated.  Albuterol inhaler or neb As needed   Continue Oxygen 2l/m . (3l/m on portable system)  Follow up with Dr. Chase Caller or Ashtin Rosner NP in 3-4 months and As needed  Please contact office for sooner follow up if symptoms do not improve or worsen or seek emergency care      COVID-19 virus infection Recent covid 19 infection - clinically improving  Activity as tolerated.   Plan  Patient Instructions  Continue on TRELEGY 1 puff daily , rinse after use.  Albuterol inhaler or nebs As needed   Continue on Daliresp daily  Activity as tolerated.  Albuterol inhaler or neb As needed   Continue Oxygen 2l/m . (3l/m on portable system)  Follow up with Dr. Chase Caller or Shanti Agresti NP in 3-4 months and As needed  Please contact office for sooner follow up if symptoms do not improve or worsen or seek emergency care        Rexene Edison, NP 01/14/2022

## 2022-01-14 NOTE — Assessment & Plan Note (Signed)
Recent flare with Covid 19 infection-resolving . Continue on current maintenance regimen   Plan  Patient Instructions  Continue on TRELEGY 1 puff daily , rinse after use.  Albuterol inhaler or nebs As needed   Continue on Daliresp daily  Activity as tolerated.  Albuterol inhaler or neb As needed   Continue Oxygen 2l/m . (3l/m on portable system)  Follow up with Dr. Chase Caller or Atziry Baranski NP in 3-4 months and As needed  Please contact office for sooner follow up if symptoms do not improve or worsen or seek emergency care

## 2022-02-11 DIAGNOSIS — G4733 Obstructive sleep apnea (adult) (pediatric): Secondary | ICD-10-CM | POA: Diagnosis not present

## 2022-02-11 DIAGNOSIS — J449 Chronic obstructive pulmonary disease, unspecified: Secondary | ICD-10-CM | POA: Diagnosis not present

## 2022-02-11 DIAGNOSIS — I10 Essential (primary) hypertension: Secondary | ICD-10-CM | POA: Diagnosis not present

## 2022-02-22 NOTE — Telephone Encounter (Signed)
Opened in error

## 2022-03-01 ENCOUNTER — Other Ambulatory Visit: Payer: Self-pay | Admitting: Cardiovascular Disease

## 2022-03-01 DIAGNOSIS — E785 Hyperlipidemia, unspecified: Secondary | ICD-10-CM

## 2022-03-01 DIAGNOSIS — I10 Essential (primary) hypertension: Secondary | ICD-10-CM

## 2022-03-01 DIAGNOSIS — E876 Hypokalemia: Secondary | ICD-10-CM

## 2022-03-01 DIAGNOSIS — I2510 Atherosclerotic heart disease of native coronary artery without angina pectoris: Secondary | ICD-10-CM

## 2022-03-02 ENCOUNTER — Other Ambulatory Visit: Payer: Self-pay | Admitting: Endocrinology

## 2022-03-02 DIAGNOSIS — E118 Type 2 diabetes mellitus with unspecified complications: Secondary | ICD-10-CM

## 2022-03-04 ENCOUNTER — Telehealth: Payer: Self-pay

## 2022-03-04 DIAGNOSIS — E118 Type 2 diabetes mellitus with unspecified complications: Secondary | ICD-10-CM

## 2022-03-04 NOTE — Telephone Encounter (Signed)
Pharmacy called to follow up for refills for lancets and test strips. Pt is not currently scheduled with a provider at this office.

## 2022-03-05 ENCOUNTER — Other Ambulatory Visit: Payer: Self-pay

## 2022-03-05 ENCOUNTER — Other Ambulatory Visit: Payer: Self-pay | Admitting: Internal Medicine

## 2022-03-05 DIAGNOSIS — E118 Type 2 diabetes mellitus with unspecified complications: Secondary | ICD-10-CM

## 2022-03-05 MED ORDER — ACCU-CHEK AVIVA PLUS VI STRP: ORAL_STRIP | 0 refills | Status: DC

## 2022-03-10 MED ORDER — ACCU-CHEK SOFTCLIX LANCETS MISC: 1 refills | Status: DC

## 2022-03-10 NOTE — Telephone Encounter (Signed)
Rx sent to preferred pharmacy.

## 2022-03-13 DIAGNOSIS — G4733 Obstructive sleep apnea (adult) (pediatric): Secondary | ICD-10-CM | POA: Diagnosis not present

## 2022-03-13 DIAGNOSIS — I10 Essential (primary) hypertension: Secondary | ICD-10-CM | POA: Diagnosis not present

## 2022-03-13 DIAGNOSIS — J449 Chronic obstructive pulmonary disease, unspecified: Secondary | ICD-10-CM | POA: Diagnosis not present

## 2022-03-25 ENCOUNTER — Encounter: Payer: Self-pay | Admitting: Internal Medicine

## 2022-03-25 ENCOUNTER — Ambulatory Visit (INDEPENDENT_AMBULATORY_CARE_PROVIDER_SITE_OTHER): Payer: Medicare Other | Admitting: Internal Medicine

## 2022-03-25 VITALS — BP 124/76 | HR 79 | Ht 64.0 in | Wt 206.0 lb

## 2022-03-25 DIAGNOSIS — E042 Nontoxic multinodular goiter: Secondary | ICD-10-CM | POA: Diagnosis not present

## 2022-03-25 DIAGNOSIS — E118 Type 2 diabetes mellitus with unspecified complications: Secondary | ICD-10-CM

## 2022-03-25 DIAGNOSIS — E119 Type 2 diabetes mellitus without complications: Secondary | ICD-10-CM | POA: Diagnosis not present

## 2022-03-25 LAB — POCT GLUCOSE (DEVICE FOR HOME USE): POC Glucose: 117 mg/dl — AB (ref 70–99)

## 2022-03-25 LAB — POCT GLYCOSYLATED HEMOGLOBIN (HGB A1C): Hemoglobin A1C: 7.1 % — AB (ref 4.0–5.6)

## 2022-03-25 LAB — TSH: TSH: 1.8 u[IU]/mL (ref 0.35–5.50)

## 2022-03-25 LAB — T4, FREE: Free T4: 0.83 ng/dL (ref 0.60–1.60)

## 2022-03-25 MED ORDER — JANUMET XR 50-1000 MG PO TB24: 2.0000 | ORAL_TABLET | Freq: Every day | ORAL | 3 refills | Status: DC

## 2022-03-25 NOTE — Patient Instructions (Signed)
Take Janumet 2 tablets before Supper

## 2022-03-25 NOTE — Progress Notes (Signed)
Name: Victoria [name]  Age/ Sex: 76 y.o., female   MRN/ DOB: [id number], January 13, 1946     PCP: Janith Lima, MD   Reason for Endocrinology Evaluation: Type 2 Diabetes Mellitus  Initial Endocrine Consultative Visit: 02/28/2014    PATIENT IDENTIFIER: Victoria [name] is a 76 y.o. female with a past medical history of OSA, COPD, HTN, Hx of breast Ca. The patient has followed with Endocrinology clinic since 02/28/2014 for consultative assistance with management of her diabetes.  DIABETIC HISTORY:  Ms. Awadallah was diagnosed with DM 2017. Her hemoglobin A1c has ranged from 6.3% in 2022, peaking at 10.3% in 2020.  THYROID HISTORY: The patient has been diagnosed with hyperthyroid in 2019.  Thyroid ultrasound showed multinodular goiter.   She is s/p benign FNA of the isthmic nodule in May 2006 and in 2018 She is S/P benign FNA of right mid thyroid nodule 02/2015 and in 2019   Thyroid ultrasound on 03/02/2021 showed 6 year stability of multiple thyroid nodules and NO further ultrasound was recommended  She is S/P RAI ablation 31.5 mCi I-131 sodium iodide orally  03/19/2019    SUBJECTIVE:   During the last visit (07/2021): Saw Dr. Loanne Drilling  Today (03/25/2022): Ms. Brick  is here for a follow up on diabetes management. She checks her blood sugars 1 times daily. The patient has not had hypoglycemic episodes since the last clinic visit  She had a bloody stool two days ago but this has resolved   She has intermittent diarrhea with high fiber intake    She is on oxygen 24 hrs a day   HOME DIABETES REGIMEN:  Janumet 50-1000 2 tabs daily     Statin: yes ACE-I/ARB: no   METER DOWNLOAD SUMMARY: Did not bring       DIABETIC COMPLICATIONS: Microvascular complications:   Denies: CKD, retinopathy  Last Eye Exam: Completed 2022  Macrovascular complications:   Denies: CAD, CVA, PVD   HISTORY:  Past Medical History:  Past Medical History:  Diagnosis Date    Abdominal aneurysm ([place])    Dr. Oneida Alar follows lLOV 2 ''17 per pt "around 2 cm"   Anemia    as a child   Arthritis    left ankle, right knee, right SI joint, wrists, lower back   Breast cancer in female [facility])    Left   COPD (chronic obstructive pulmonary disease) ([place])    ephysema-Dr. 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 Harlow Asa in [place] [facility]   Sleep apnea    cpap used sometimes, uses oxygen concentrator 2 l/m nasally bedtime   Varicella as child   Past Surgical History:  Past Surgical History:  Procedure Laterality Date   APPENDECTOMY     2008   BREAST LUMPECTOMY Left 08/31/2017   BREAST LUMPECTOMY WITH RADIOACTIVE SEED LOCALIZATION Left 08/10/2017   Procedure: BREAST LUMPECTOMY WITH RADIOACTIVE SEED LOCALIZATION;  Surgeon: Rolm Bookbinder, MD;  Location: [facility];  Service: General;  Laterality: Left;  CESAREAN SECTION     1972   COLONOSCOPY     COLONOSCOPY WITH PROPOFOL N/A 04/16/2016   Procedure: COLONOSCOPY WITH PROPOFOL;  Surgeon: Carol Ada, MD;  Location: [facility];  Service: Endoscopy;  Laterality: N/A;   CYST REMOVAL NECK Left 04/13/2016   Procedure: EXCISION OF SEBACEOUS CYST LEFT POSTERIOR NECK;  Surgeon: Armandina Gemma, MD;  Location: [facility];  Service: General;  Laterality: Left;   Excision of Pelvic Absess, Right Ovary     2008   RE-EXCISION OF BREAST CANCER,SUPERIOR MARGINS Left 08/31/2017   Procedure: RE-EXCISION OF LEFT  BREAST MARGINS ERAS PATHWAY;  Surgeon: Rolm Bookbinder, MD;  Location: [facility];  Service: General;  Laterality: Left;   TUBAL LIGATION  1980    Social History:  reports that she quit smoking about 10 years ago. Her smoking use included cigarettes. She has a 50.00 pack-year smoking history. She has never used smokeless tobacco. She reports current alcohol use. She reports that she does not use drugs. Family History:  Family History  Problem Relation Age of Onset   Heart disease Mother        MI - fatal   Hypertension Mother    Stroke Father 71       fatal   Alzheimer's disease Father    Alzheimer's disease Brother    Hyperlipidemia Brother    Hypertension Brother    Diabetes Brother    Hypertension Brother    Hyperlipidemia Brother    Breast cancer Paternal Grandmother      HOME MEDICATIONS: Allergies as of 03/25/2022       Reactions   Benicar [olmesartan] Swelling   Swelling of face and arms    [name] [valsartan] Swelling   Swelling of face and arms    Hydrocodone-acetaminophen Nausea And Vomiting   Severe vomiting   Lisinopril Cough   Monosodium Glutamate Other (See Comments)   Facial swelling per pt   Codeine Other (See Comments)   jittery   Lead Acetate Rash   Nickel Rash   Severe rash to infection: pt is allergic to all metals other than sterling silver or gold jewelry.         Medication List        Accurate as of March 25, 2022 10:41 AM. If you have any questions, ask your nurse or doctor.          Accu-Chek Aviva Plus w/Device Kit 1 Device by Does not apply route daily. Use to monitor glucose levels once per day; E11.9   Accu-Chek Softclix Lancets lancets Use as instructed to check blood sugar 1X daily   acetaminophen 500 MG tablet Commonly known as: TYLENOL Take 1,000 mg by mouth every 6 (six) hours as needed.   amLODipine 10 MG tablet Commonly known as: NORVASC TAKE ONE TABLET BY MOUTH ONCE DAILY   anastrozole 1 MG tablet Commonly known as: ARIMIDEX Take 1 tablet (1 mg total) by mouth daily.   aspirin EC 81 MG tablet Take 1 tablet (81 mg total) by mouth daily.    carvedilol 6.25 MG tablet Commonly known as: COREG TAKE ONE TABLET BY MOUTH EVERY MORNING and TAKE ONE TABLET BY MOUTH EVERYDAY AT BEDTIME   Daliresp 500 MCG Tabs tablet Generic drug: roflumilast TAKE ONE TABLET BY MOUTH EVERY MORNING   denosumab 60 MG/ML Soln injection Commonly known as: PROLIA Inject 60 mg into the skin every 6 (six) months. Administer in upper arm, thigh, or abdomen   glucose blood test strip USE 1  STRIP ONCE DAILY   Accu-Chek Guide test strip Generic drug: glucose blood USE 1 STRIP ONCE DAILY   Accu-Chek Aviva Plus test strip Generic drug: glucose blood USE ONE strip TO check glucose THREE TIMES DAILY   ibuprofen 400 MG tablet Commonly known as: ADVIL Take 1 tablet (400 mg total) by mouth every 6 (six) hours as needed.   Janumet XR 50-1000 MG Tb24 Generic drug: SitaGLIPtin-MetFORMIN HCl TAKE TWO TABLETS BY MOUTH EVERYDAY AT BEDTIME   Mucinex 600 MG 12 hr tablet Generic drug: guaiFENesin Take 1,200 mg by mouth 2 (two) times daily.   OXYGEN Inhale 2-3 L into the lungs continuous. When exerting self   potassium chloride SA 20 MEQ tablet Commonly known as: KLOR-CON M Take 1 tablet (20 mEq total) by mouth 2 (two) times daily.   predniSONE 20 MG tablet Commonly known as: DELTASONE Take 1 tablet (20 mg total) by mouth daily with breakfast.   ProAir RespiClick 654 (90 Base) MCG/ACT Aepb Generic drug: Albuterol Sulfate Inhale 1-2 puffs into the lungs every 6 (six) hours as needed (for wheezing/shortness of breath).   albuterol (2.5 MG/3ML) 0.083% nebulizer solution Commonly known as: PROVENTIL Take 3 mLs (2.5 mg total) by nebulization every 6 (six) hours as needed for wheezing or shortness of breath.   rosuvastatin 40 MG tablet Commonly known as: CRESTOR TAKE ONE TABLET BY MOUTH EVERYDAY AT BEDTIME   spironolactone 25 MG tablet Commonly known as: ALDACTONE TAKE ONE TABLET BY MOUTH ONCE DAILY   thiamine 100 MG tablet Commonly known as:  Vitamin B-1 Take 1 tablet (100 mg total) by mouth every other day.   Trelegy Ellipta 100-62.5-25 MCG/ACT Aepb Generic drug: Fluticasone-Umeclidin-Vilant Inhale ONE PUFF into THE lungs daily   Vitamin D 125 MCG (5000 UT) Caps Take 1 capsule by mouth daily.         OBJECTIVE:   Vital Signs: Ht _0  (1.626 m)   Wt 206 lb (93.4 kg)   BMI 35.36 kg/m   Wt Readings from Last 3 Encounters:  03/25/22 206 lb (93.4 kg)  01/14/22 201 lb 12.8 oz (91.5 kg)  11/10/21 201 lb (91.2 kg)     Exam: General: Pt appears well and is in NAD  Neck: General: Supple without adenopathy. Thyroid: Thyroid size normal.  No goiter or nodules appreciated.   Lungs: Clear with good BS bilat   Heart: RRR   Extremities: No pretibial edema.   Neuro: MS is good with appropriate affect, pt is alert and Ox3    DM foot exam: 03/25/2022  The skin of the feet is without sores or ulcerations, multiple callus formation noted b/l The pedal pulses are 2+ on right and 2+ on left. The sensation is intact to a screening 5.07, 10 gram monofilament bilaterally        DATA REVIEWED:  Lab Results  Component Value Date   HGBA1C 7.2 (A) 07/22/2021   HGBA1C 6.7 (A) 02/23/2021   HGBA1C 6.3 (H) 01/25/2021    Latest Reference Range & Units 03/25/22 11:21  TSH 0.35 - 5.50 uIU/mL 1.80  T4,Free(Direct) 0.60 - 1.60 ng/dL 0.83        Latest Reference Range & Units 11/05/21 11:23  Sodium 135 - 145 mmol/L 140  Potassium 3.5 - 5.1 mmol/L 3.6  Chloride 98 - 111 mmol/L 103  CO2 22 - 32 mmol/L 31  Glucose 70 - 99 mg/dL 122 (H)  BUN 8 - 23 mg/dL 15  Creatinine 0.44 - 1.00 mg/dL 0.84  Calcium  8.9 - 10.3 mg/dL 10.0  Anion gap 5 - 15  6  Alkaline Phosphatase 38 - 126 U/L 91  Albumin 3.5 - 5.0 g/dL 3.8  AST 15 - 41 U/L 39  ALT 0 - 44 U/L 28  Total Protein 6.5 - 8.1 g/dL 6.9  Total Bilirubin 0.3 - 1.2 mg/dL 0.4  GFR, Est Non African American >60 mL/min >60   Old records , labs and images have been reviewed.    ASSESSMENT / PLAN / RECOMMENDATIONS:   1) Type 2 Diabetes Mellitus, Optimally controlled, Without complications - Most recent A1c of 7.1 %. Goal A1c < 7.5 %.    -A1c at goal -No changes to the Janumet dose, except for her to take the Janumet before supper rather than after the meal    MEDICATIONS: Take Janumet 50-1000 mg, 2 tabs before supper  EDUCATION / INSTRUCTIONS: BG monitoring instructions: Patient is instructed to check her blood sugars 1 times a day, fasting. Call [facility] if: BG persistently < 70  I reviewed the Rule of 15 for the treatment of hypoglycemia in detail with the patient. Literature supplied.    2) Diabetic complications:  Eye: Does not have known diabetic retinopathy.  Neuro/ Feet: Does not have known diabetic peripheral neuropathy .  Renal: Patient does not have known baseline CKD. She   is not on an ACEI/ARB at present.   3) MNG:  -She is s/p RAI in 2020 with normalization of thyroid function -No local neck symptoms -She is s/p multiple FNAs over the years, thyroid ultrasound 2020 confirmed 6-year stability and no serial ultrasounds were recommended at the time   F/U in 6 months     Signed electronically by: Mack Guise, MD  [facility]  [facility] [address] [place], [place] [address] Phone: [phone] FAX: [phone]   CC: Janith Lima, [place] [address] Phone: [phone]  Fax: [phone]  Return to Endocrinology clinic as below: Future Appointments  Date Time Provider [place]  04/19/2022 10:00 AM Brand Males, MD [facility] None  06/04/2022  2:40 PM [facility] NURSE [facility] None  09/07/2022 11:30 AM Benay Pike, MD [facility] None

## 2022-03-26 ENCOUNTER — Encounter: Payer: Self-pay | Admitting: Internal Medicine

## 2022-03-26 DIAGNOSIS — E119 Type 2 diabetes mellitus without complications: Secondary | ICD-10-CM | POA: Insufficient documentation

## 2022-04-13 DIAGNOSIS — G4733 Obstructive sleep apnea (adult) (pediatric): Secondary | ICD-10-CM | POA: Diagnosis not present

## 2022-04-13 DIAGNOSIS — J449 Chronic obstructive pulmonary disease, unspecified: Secondary | ICD-10-CM | POA: Diagnosis not present

## 2022-04-13 DIAGNOSIS — I10 Essential (primary) hypertension: Secondary | ICD-10-CM | POA: Diagnosis not present

## 2022-04-19 ENCOUNTER — Ambulatory Visit: Payer: Medicare Other | Admitting: Internal Medicine

## 2022-04-19 ENCOUNTER — Encounter: Payer: Medicare Other | Admitting: Internal Medicine

## 2022-04-19 NOTE — Progress Notes (Signed)
 This encounter was created in error - please disregard.

## 2022-04-19 NOTE — Patient Instructions (Signed)
COPD, severe ([place]) Chronic respiratory failure with hypoxia ([facility])  -Stable disease  Plan -Continue oxygen, Trelegy and Daliresp as before. - get backpack for o2  -only lighter o2 system thatn what you have is [employer] G4 - 3.3#  our staff can try to get it for you but you might have to do it out of pocket by contacting [employer] - support motorized scooter/wheel chair  due to copd, obesity, OA   Cancer screening  -No evidence of lung cancer in August 2020 low-dose CT scan of the chest and feb 2022  Plan -Repeat CT scan of the chest March 2023   Follow-up -2-3  months with Victoria [name] for routine followup

## 2022-04-28 ENCOUNTER — Telehealth: Payer: Self-pay | Admitting: Internal Medicine

## 2022-04-28 NOTE — Telephone Encounter (Signed)
Patient called and would like a prescription sent in for a muscle relaxer to [facility].

## 2022-04-29 NOTE — Telephone Encounter (Signed)
Forwarding to RX Prior Auth Team 

## 2022-05-12 ENCOUNTER — Telehealth: Payer: Self-pay | Admitting: Internal Medicine

## 2022-05-12 NOTE — Telephone Encounter (Signed)
Called and spoke with pt who stated she was told that if she were to have the brand name Daliresp that she would have to pay for it as her insurance is preferring the generic roflumilast. Stated to pt that the generic version of Daliresp would be fine for the pharmacy to fill. Stated to pt that I would call the pharmacy to let them know this and she verbalized understanding.  Called pt's pharmacy to let them know that generic was fine and they verbalized understanding. Nothing further needed.

## 2022-05-12 NOTE — Telephone Encounter (Signed)
Patient called to inform the doctor that the pharmacy is out of the medication for Dairesp and wants to know if the patient can take an alternative medication.  She is not sure of the name but wants to confirm with the doctor if this would be ok.  Please call to discuss at [phone]

## 2022-05-13 DIAGNOSIS — G4733 Obstructive sleep apnea (adult) (pediatric): Secondary | ICD-10-CM | POA: Diagnosis not present

## 2022-05-13 DIAGNOSIS — I10 Essential (primary) hypertension: Secondary | ICD-10-CM | POA: Diagnosis not present

## 2022-05-13 DIAGNOSIS — J449 Chronic obstructive pulmonary disease, unspecified: Secondary | ICD-10-CM | POA: Diagnosis not present

## 2022-05-19 NOTE — Telephone Encounter (Signed)
Prolia VOB initiated via [email or web address] 

## 2022-05-24 ENCOUNTER — Other Ambulatory Visit: Payer: Self-pay | Admitting: Internal Medicine

## 2022-05-24 NOTE — Patient Instructions (Signed)
COPD, severe ([place]) Chronic respiratory failure with hypoxia (HCC)  -Stable disease  Plan -Continue oxygen, Trelegy and Daliresp as before. - get backpack for o2  -only lighter o2 system thatn what you have is INNOGEN G4 - 3.3#  our staff can try to get it for you but you might have to do it out of pocket by contacting [employer] - support motorized scooter/wheel chair  due to copd, obesity, OA   Cancer screening  -No evidence of lung cancer in August 2020 low-dose CT scan of the chest and feb 2022  Plan -Repeat CT scan of the chest March 2023   Follow-up -2-3  months with Dr Chase Caller for routine followup

## 2022-05-24 NOTE — Progress Notes (Signed)
 This encounter was created in error - please disregard.

## 2022-05-25 ENCOUNTER — Telehealth: Payer: Self-pay | Admitting: Internal Medicine

## 2022-05-25 ENCOUNTER — Encounter: Payer: Medicare Other | Admitting: Internal Medicine

## 2022-05-25 NOTE — Telephone Encounter (Signed)
PT calling to resched. Appt w/Dr. R today. Due to weather too scared to drive. No my chart for tela visit.    See appt notes as to why she is coming in.  Please call to advise if she can resched w/a NP for these particulars (scooter RX). I told her no appts and he is hard to get in to see. Keeping appt open until contacted by  triage. TY.

## 2022-05-25 NOTE — Telephone Encounter (Signed)
Pt called back stating that she was going to need to reschedule her appt. Appt  has been rescheduled. Nothing further needed.

## 2022-05-26 MED ORDER — TRELEGY ELLIPTA 100-62.5-25 MCG/ACT IN AEPB: INHALATION_SPRAY | RESPIRATORY_TRACT | 5 refills | Status: DC

## 2022-05-26 NOTE — Telephone Encounter (Signed)
Called and spoke to patient and verified that she was needing refills on her trelegy inhaler. Verified pharmacy with her. Nothing further needed

## 2022-05-27 ENCOUNTER — Other Ambulatory Visit: Payer: Self-pay | Admitting: Internal Medicine

## 2022-05-31 ENCOUNTER — Other Ambulatory Visit (HOSPITAL_COMMUNITY): Payer: Self-pay

## 2022-05-31 NOTE — Telephone Encounter (Signed)
Pt ready for scheduling on or after 06/05/22   Out-of-pocket cost due at time of visit: $301   Primary: [employer] Medicare Prolia co-insurance: 20% (approximately $276) Admin fee co-insurance: 20% (approximately $25)   Secondary: n/a Prolia co-insurance:  Admin fee co-insurance:    Deductible: does not apply   Prior Auth: APPROVED PA# [id number] Valid: 06/23/21-06/23/22   ** This summary of benefits is an estimation of the patient's out-of-pocket cost. Exact cost may vary based on individual plan coverage.

## 2022-05-31 NOTE — Telephone Encounter (Signed)
Pharmacy Patient Advocate Encounter  Insurance verification completed.    The patient is insured through [id number]   Ran test claims for: Prolia 60mg.  Pharmacy benefit copay: $0.00  

## 2022-05-31 NOTE — Telephone Encounter (Signed)
Pharmacy benefit is cheaper for the patient. Send rx to [facility] 

## 2022-06-02 ENCOUNTER — Ambulatory Visit (INDEPENDENT_AMBULATORY_CARE_PROVIDER_SITE_OTHER): Payer: 59

## 2022-06-02 VITALS — Ht 64.0 in | Wt 196.0 lb

## 2022-06-02 DIAGNOSIS — Z0000 Encounter for general adult medical examination without abnormal findings: Secondary | ICD-10-CM

## 2022-06-02 NOTE — Patient Instructions (Signed)
Victoria [name] , Thank you for taking time to come for your Medicare Wellness Visit. I appreciate your ongoing commitment to your health goals. Please review the following plan we discussed and let me know if I can assist you in the future.   These are the goals we discussed:  Goals      Client understands the importance of follow-up with providers by attending scheduled visits.     Client will verbalize knowledge of diabetes self-management as evidenced by Hgb A1C <7 or as defined by provider.            This is a list of the screening recommended for you and due dates:  Health Maintenance  Topic Date Due   Eye exam for diabetics  03/27/2020   Yearly kidney health urinalysis for diabetes  04/18/2020   DTaP/Tdap/Td vaccine (2 - Tdap) 12/05/2021   Flu Shot  12/15/2021   COVID-19 Vaccine (4 - 2023-24 season) 01/15/2022   Screening for Lung Cancer  09/22/2022   Hemoglobin A1C  09/23/2022   Yearly kidney function blood test for diabetes  11/06/2022   Complete foot exam   03/26/2023   Medicare Annual Wellness Visit  06/03/2023   Pneumonia Vaccine  Completed   DEXA scan (bone density measurement)  Completed   Hepatitis C Screening: USPSTF Recommendation to screen - Ages 71-79 yo.  Completed   Zoster (Shingles) Vaccine  Completed   HPV Vaccine  Aged Out   Colon Cancer Screening  Discontinued    Advanced directives: Yes  Conditions/risks identified: Yes; Type II Diabetes Mellitus, COPD  Next appointment: Follow up in one year for your annual wellness visit.   Preventive Care 62 Years and Older, Female Preventive care refers to lifestyle choices and visits with your health care provider that can promote health and wellness. What does preventive care include? A yearly physical exam. This is also called an annual well check. Dental exams once or twice a year. Routine eye exams. Ask your health care provider how often you should have your eyes checked. Personal lifestyle choices,  including: Daily care of your teeth and gums. Regular physical activity. Eating a healthy diet. Avoiding tobacco and drug use. Limiting alcohol use. Practicing safe sex. Taking low-dose aspirin every day. Taking vitamin and mineral supplements as recommended by your health care provider. What happens during an annual well check? The services and screenings done by your health care provider during your annual well check will depend on your age, overall health, lifestyle risk factors, and family history of disease. Counseling  Your health care provider may ask you questions about your: Alcohol use. Tobacco use. Drug use. Emotional well-being. Home and relationship well-being. Sexual activity. Eating habits. History of falls. Memory and ability to understand (cognition). Work and work Statistician. Reproductive health. Screening  You may have the following tests or measurements: Height, weight, and BMI. Blood pressure. Lipid and cholesterol levels. These may be checked every 5 years, or more frequently if you are over 59 years old. Skin check. Lung cancer screening. You may have this screening every year starting at age 37 if you have a 30-pack-year history of smoking and currently smoke or have quit within the past 15 years. Fecal occult blood test (FOBT) of the stool. You may have this test every year starting at age 1. Flexible sigmoidoscopy or colonoscopy. You may have a sigmoidoscopy every 5 years or a colonoscopy every 10 years starting at age 79. Hepatitis C blood test. Hepatitis B blood test.  Sexually transmitted disease (STD) testing. Diabetes screening. This is done by checking your blood sugar (glucose) after you have not eaten for a while (fasting). You may have this done every 1-3 years. Bone density scan. This is done to screen for osteoporosis. You may have this done starting at age 30. Mammogram. This may be done every 1-2 years. Talk to your health care provider  about how often you should have regular mammograms. Talk with your health care provider about your test results, treatment options, and if necessary, the need for more tests. Vaccines  Your health care provider may recommend certain vaccines, such as: Influenza vaccine. This is recommended every year. Tetanus, diphtheria, and acellular pertussis (Tdap, Td) vaccine. You may need a Td booster every 10 years. Zoster vaccine. You may need this after age 3. Pneumococcal 13-valent conjugate (PCV13) vaccine. One dose is recommended after age 79. Pneumococcal polysaccharide (PPSV23) vaccine. One dose is recommended after age 70. Talk to your health care provider about which screenings and vaccines you need and how often you need them. This information is not intended to replace advice given to you by your health care provider. Make sure you discuss any questions you have with your health care provider. Document Released: 05/30/2015 Document Revised: 01/21/2016 Document Reviewed: 03/04/2015 Elsevier Interactive Patient Education  2017 [employer] Prevention in the Home Falls can cause injuries. They can happen to people of all ages. There are many things you can do to make your home safe and to help prevent falls. What can I do on the outside of my home? Regularly fix the edges of walkways and driveways and fix any cracks. Remove anything that might make you trip as you walk through a door, such as a raised step or threshold. Trim any bushes or trees on the path to your home. Use bright outdoor lighting. Clear any walking paths of anything that might make someone trip, such as rocks or tools. Regularly check to see if handrails are loose or broken. Make sure that both sides of any steps have handrails. Any raised decks and porches should have guardrails on the edges. Have any leaves, snow, or ice cleared regularly. Use sand or salt on walking paths during winter. Clean up any spills in  your garage right away. This includes oil or grease spills. What can I do in the bathroom? Use night lights. Install grab bars by the toilet and in the tub and shower. Do not use towel bars as grab bars. Use non-skid mats or decals in the tub or shower. If you need to sit down in the shower, use a plastic, non-slip stool. Keep the floor dry. Clean up any water that spills on the floor as soon as it happens. Remove soap buildup in the tub or shower regularly. Attach bath mats securely with double-sided non-slip rug tape. Do not have throw rugs and other things on the floor that can make you trip. What can I do in the bedroom? Use night lights. Make sure that you have a light by your bed that is easy to reach. Do not use any sheets or blankets that are too big for your bed. They should not hang down onto the floor. Have a firm chair that has side arms. You can use this for support while you get dressed. Do not have throw rugs and other things on the floor that can make you trip. What can I do in the kitchen? Clean up any spills right away.  Avoid walking on wet floors. Keep items that you use a lot in easy-to-reach places. If you need to reach something above you, use a strong step stool that has a grab bar. Keep electrical cords out of the way. Do not use floor polish or wax that makes floors slippery. If you must use wax, use non-skid floor wax. Do not have throw rugs and other things on the floor that can make you trip. What can I do with my stairs? Do not leave any items on the stairs. Make sure that there are handrails on both sides of the stairs and use them. Fix handrails that are broken or loose. Make sure that handrails are as long as the stairways. Check any carpeting to make sure that it is firmly attached to the stairs. Fix any carpet that is loose or worn. Avoid having throw rugs at the top or bottom of the stairs. If you do have throw rugs, attach them to the floor with carpet  tape. Make sure that you have a light switch at the top of the stairs and the bottom of the stairs. If you do not have them, ask someone to add them for you. What else can I do to help prevent falls? Wear shoes that: Do not have high heels. Have rubber bottoms. Are comfortable and fit you well. Are closed at the toe. Do not wear sandals. If you use a stepladder: Make sure that it is fully opened. Do not climb a closed stepladder. Make sure that both sides of the stepladder are locked into place. Ask someone to hold it for you, if possible. Clearly mark and make sure that you can see: Any grab bars or handrails. First and last steps. Where the edge of each step is. Use tools that help you move around (mobility aids) if they are needed. These include: Canes. Walkers. Scooters. Crutches. Turn on the lights when you go into a dark area. Replace any light bulbs as soon as they burn out. Set up your furniture so you have a clear path. Avoid moving your furniture around. If any of your floors are uneven, fix them. If there are any pets around you, be aware of where they are. Review your medicines with your doctor. Some medicines can make you feel dizzy. This can increase your chance of falling. Ask your doctor what other things that you can do to help prevent falls. This information is not intended to replace advice given to you by your health care provider. Make sure you discuss any questions you have with your health care provider. Document Released: 02/27/2009 Document Revised: 10/09/2015 Document Reviewed: 06/07/2014 Elsevier Interactive Patient Education  2017 [employer].

## 2022-06-02 NOTE — Progress Notes (Signed)
Virtual Visit via Telephone Note  I connected with  Victoria [name] on 06/02/22 at  9:45 AM EST by telephone and verified that I am speaking with the correct person using two identifiers.  Location: Patient: Home Provider: [place] Persons participating in the virtual visit: [place]   I discussed the limitations, risks, security and privacy concerns of performing an evaluation and management service by telephone and the availability of in person appointments. The patient expressed understanding and agreed to proceed.  Interactive audio and video telecommunications were attempted between this nurse and patient, however failed, due to patient having technical difficulties OR patient did not have access to video capability.  We continued and completed visit with audio only.  Some vital signs may be absent or patient reported.   Sheral Flow, LPN  Subjective:   Victoria [name] is a 77 y.o. female who presents for Medicare Annual (Subsequent) preventive examination.  Review of Systems     Cardiac Risk Factors include: advanced age (>59mn, >>16women);diabetes mellitus;dyslipidemia;family history of premature cardiovascular disease;hypertension;obesity (BMI >30kg/m2)     Objective:    Today's Vitals   06/02/22 0947  Weight: 196 lb (88.9 kg)  Height: '5\' 4"'$  (1.626 m)  PainSc: 0-No pain   Body mass index is 33.64 kg/m.     06/02/2022    9:53 AM 05/29/2021    8:46 AM 01/25/2021    1:04 PM 03/31/2020    9:56 AM 02/20/2020   12:36 PM 06/12/2018    3:25 PM 08/31/2017    7:37 AM  Advanced Directives  Does Patient Have a Medical Advance Directive? Yes No No No Yes No Yes  Type of AParamedicof APort [place] will    HF[place]reauwill  Does patient want to make changes to medical advance directive?     No - Patient declined  No - Patient declined  Copy of HReadingin Chart?  No - copy requested    No - copy requested  No - copy requested  Would patient like information on creating a medical advance directive?  No - Patient declined    Yes (ED - Information included in AVS)     Current Medications (verified) Outpatient Encounter Medications as of 06/02/2022  Medication Sig   Accu-Chek Softclix Lancets lancets Use as instructed to check blood sugar 1X daily   acetaminophen (TYLENOL) 500 MG tablet Take 1,000 mg by mouth every 6 (six) hours as needed.   albuterol (PROVENTIL) (2.5 MG/3ML) 0.083% nebulizer solution Take 3 mLs (2.5 mg total) by nebulization every 6 (six) hours as needed for wheezing or shortness of breath.   Albuterol Sulfate (PROAIR RESPICLICK) 1381(90 Base) MCG/ACT AEPB Inhale 1-2 puffs into the lungs every 6 (six) hours as needed (for wheezing/shortness of breath).   amLODipine (NORVASC) 10 MG tablet TAKE ONE TABLET BY MOUTH ONCE DAILY   anastrozole (ARIMIDEX) 1 MG tablet Take 1 tablet (1 mg total) by mouth daily.   aspirin EC 81 MG tablet Take 1 tablet (81 mg total) by mouth daily.   Blood Glucose Monitoring Suppl (ACCU-CHEK AVIVA PLUS) w/Device KIT 1 Device by Does not apply route daily. Use to monitor glucose levels once per day; E11.9   carvedilol (COREG) 6.25 MG tablet TAKE ONE TABLET BY MOUTH EVERY MORNING and TAKE ONE TABLET BY MOUTH EVERYDAY AT BEDTIME   Cholecalciferol (VITAMIN D) 125 MCG (5000 UT) CAPS Take 1 capsule by mouth  daily.   DALIRESP 500 MCG TABS tablet TAKE ONE TABLET BY MOUTH EVERY MORNING   denosumab (PROLIA) 60 MG/ML SOLN injection Inject 60 mg into the skin every 6 (six) months. Administer in upper arm, thigh, or abdomen   Fluticasone-Umeclidin-Vilant (TRELEGY ELLIPTA) 100-62.5-25 MCG/ACT AEPB INHALE 1 PUFF BY MOUTH INTO LUNGS DAILY   glucose blood (ACCU-CHEK AVIVA PLUS) test strip USE TO Check blood glucose THREE TIMES DAILY   glucose blood (ACCU-CHEK GUIDE) test strip USE 1 STRIP ONCE DAILY   glucose blood test strip USE 1  STRIP ONCE DAILY   ibuprofen (ADVIL) 400 MG tablet Take 1 tablet (400 mg total) by mouth every 6 (six) hours as needed.   OXYGEN Inhale 2-3 L into the lungs continuous. When exerting self   potassium chloride SA (KLOR-CON) 20 MEQ tablet Take 1 tablet (20 mEq total) by mouth 2 (two) times daily.   rosuvastatin (CRESTOR) 40 MG tablet TAKE ONE TABLET BY MOUTH EVERYDAY AT BEDTIME   SitaGLIPtin-MetFORMIN HCl (JANUMET XR) 50-1000 MG TB24 Take 2 tablets by mouth daily in the afternoon.   spironolactone (ALDACTONE) 25 MG tablet TAKE ONE TABLET BY MOUTH ONCE DAILY   thiamine (VITAMIN B-1) 100 MG tablet Take 1 tablet (100 mg total) by mouth every other day.   No facility-administered encounter medications on file as of 06/02/2022.    Allergies (verified) Benicar [olmesartan], [name] [valsartan], Hydrocodone-acetaminophen, Lisinopril, Monosodium glutamate, Codeine, Lead acetate, and Nickel   History: Past Medical History:  Diagnosis Date   Abdominal aneurysm ([place])    Dr. Oneida Alar follows lLOV 2 ''17 per pt "around 2 cm"   Anemia    as a child   Arthritis    left ankle, right knee, right SI joint, wrists, lower back   Breast cancer in female [facility])    Left   COPD (chronic obstructive pulmonary disease) ([place])    ephysema-Dr. 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 Harlow Asa in [facility]   Sleep apnea    cpap used sometimes, uses oxygen concentrator 2 l/m nasally bedtime   Varicella as child   Past Surgical History:  Procedure Laterality Date   APPENDECTOMY     2008   BREAST LUMPECTOMY Left  08/31/2017   BREAST LUMPECTOMY WITH RADIOACTIVE SEED LOCALIZATION Left 08/10/2017   Procedure: BREAST LUMPECTOMY WITH RADIOACTIVE SEED LOCALIZATION;  Surgeon: Rolm Bookbinder, MD;  Location: [place];  Service: General;  Laterality: Left;   [place]   COLONOSCOPY     COLONOSCOPY WITH PROPOFOL N/A 04/16/2016   Procedure: COLONOSCOPY WITH PROPOFOL;  Surgeon: Carol Ada, MD;  Location: [facility];  Service: Endoscopy;  Laterality: N/A;   CYST REMOVAL NECK Left 04/13/2016   Procedure: EXCISION OF SEBACEOUS CYST LEFT POSTERIOR NECK;  Surgeon: Armandina Gemma, MD;  Location: [place];  Service: General;  Laterality: Left;   Excision of Pelvic Absess, Right Ovary     2008   RE-EXCISION OF BREAST CANCER,SUPERIOR MARGINS  Left 08/31/2017   Procedure: RE-EXCISION OF LEFT  BREAST MARGINS ERAS PATHWAY;  Surgeon: Rolm Bookbinder, MD;  Location: [place];  Service: General;  Laterality: Left;   TUBAL LIGATION  1980   Family History  Problem Relation Age of Onset   Heart disease Mother        MI - fatal   Hypertension Mother    Stroke Father 8       fatal   Alzheimer's disease Father    Alzheimer's disease Brother    Hyperlipidemia Brother    Hypertension Brother    Diabetes Brother    Hypertension Brother    Hyperlipidemia Brother    Breast cancer Paternal Grandmother    Social History   Socioeconomic History   Marital status: Divorced    Spouse name: Not on file   Number of children: 1   Years of education: 16   Highest education level: Not on file  Occupational History   Occupation: Social worker, Technical sales engineer: [employer]  Tobacco Use   Smoking status: Former    Packs/day: 1.00    Years: 50.00    Total pack years: 50.00    Types: Cigarettes    Quit date: 04/16/2011    Years since quitting: 11.1   Smokeless tobacco: Never   Tobacco comments:    quit that date when she had to go to ER   Vaping Use   Vaping Use: Never used  Substance and Sexual Activity    Alcohol use: Yes    Alcohol/week: 0.0 standard drinks of alcohol    Comment: rare occasion   Drug use: No    Comment: no marijuana since 2012   Sexual activity: Not Currently  Other Topics Concern   Not on file  Social History Narrative   HSG; [name], [place]. . Married '69 - 9 yrs/divorced. 1 son - '72; no grandchildren.   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 No h/o physical or sexual abuse. ACP - no living will - wants information. Provided packet of information. On 08/05/2011: she stated she was distant cousins to celebrities [name] and [name]      Social Determinants of Health   Financial Resource Strain: Low Risk  (06/02/2022)   Overall Financial Resource Strain (CARDIA)    Difficulty of Paying Living Expenses: Not hard at all  Food Insecurity: No Food Insecurity (06/02/2022)   Hunger Vital Sign    Worried About Running Out of Food in the Last Year: Never true    Ran Out of Food in the Last Year: Never true  Transportation Needs: No Transportation Needs (06/02/2022)   PRAPARE - Hydrologist (Medical): No    Lack of Transportation (Non-Medical): No  Physical Activity: Sufficiently Active (05/29/2021)   Exercise Vital Sign    Days of Exercise per Week: 5 days    Minutes of Exercise per Session: 30 min  Stress: No Stress Concern Present (06/02/2022)   [place]    Feeling of Stress : Not at all  Social Connections: Moderately Integrated (06/02/2022)   Social Connection and Isolation Panel [NHANES]    Frequency of Communication with Friends and Family: More than three times a week    Frequency of Social Gatherings with Friends and Family: More than three times a week    Attends Religious Services: More than 4 times per year    Active Member of  Clubs or Organizations: Yes    Attends Music therapist:  More than 4 times per year    Marital Status: Divorced    Tobacco Counseling Counseling given: Not Answered Tobacco comments: quit that date when she had to go to ER    Clinical Intake:  Pre-visit preparation completed: Yes  Pain : No/denies pain Pain Score: 0-No pain     BMI - recorded: 33.64 Nutritional Status: BMI > 30  Obese Nutritional Risks: None Diabetes: Yes CBG done?: No Did pt. bring in CBG monitor from home?: No  How often do you need to have someone help you when you read instructions, pamphlets, or other written materials from your doctor or pharmacy?: 1 - Never What is the last grade level you completed in school?: Bachelor's Degree in Political Science  Nutrition Risk Assessment:  Has the patient had any N/V/D within the last 2 months?  No  Does the patient have any non-healing wounds?  No  Has the patient had any unintentional weight loss or weight gain?  No   Diabetes:  Is the patient diabetic?  Yes  If diabetic, was a CBG obtained today?  No  Did the patient bring in their glucometer from home?  No  How often do you monitor your CBG's? 3 times per day.   Financial Strains and Diabetes Management:  Are you having any financial strains with the device, your supplies or your medication? No .  Does the patient want to be seen by Chronic Care Management for management of their diabetes?  No  Would the patient like to be referred to a Nutritionist or for Diabetic Management?  No   Diabetic Exams:  Diabetic Eye Exam: Overdue for diabetic eye exam. Pt has been advised about the importance in completing this exam. Patient advised to call and schedule an eye exam. Diabetic Foot Exam: Completed 03/25/2022   Interpreter Needed?: No  Information entered by :: Lisette Abu, LPN.   Activities of Daily Living    06/02/2022    9:57 AM  In your present state of health, do you have any difficulty performing the following activities:  Hearing? 0   Vision? 0  Difficulty concentrating or making decisions? 0  Walking or climbing stairs? 1  Dressing or bathing? 0  Doing errands, shopping? 0  Preparing Food and eating ? N  Using the Toilet? N  In the past six months, have you accidently leaked urine? N  Do you have problems with loss of bowel control? N  Managing your Medications? N  Managing your Finances? N  Housekeeping or managing your Housekeeping? N    Patient Care Team: Janith Lima, MD as PCP - General (Internal Medicine) Troy Sine, MD as PCP - Cardiology (Cardiology) Brand Males, MD as Consulting Physician (Pulmonary Disease) Arvella Nigh, MD as Consulting Physician (Obstetrics and Gynecology) Magrinat, Virgie Dad, MD (Inactive) as Consulting Physician (Oncology) Rolm Bookbinder, MD as Consulting Physician (General Surgery) Gery Pray, MD as Consulting Physician (Radiation Oncology) Troy Sine, MD as Consulting Physician (Cardiology) Delice Bison, Charlestine Massed, NP as Nurse Practitioner (Hematology and Oncology) Parrett, Fonnie Mu, NP as Nurse Practitioner (Pulmonary Disease) Renato Shin, MD (Inactive) as Consulting Physician (Endocrinology) Tomasa Blase, [facility] (Inactive) (Pharmacist)  Indicate any recent Medical Services you may have received from other than [facility] providers in the past year (date may be approximate).     Assessment:   This is a routine wellness examination for Victoria [name].  Hearing/Vision screen Hearing  Screening - Comments:: Denies hearing difficulties   Vision Screening - Comments:: Wears rx glasses - up to date with routine eye exams with  [place] issues and exercise activities discussed:     Goals Addressed             This Visit's Progress    Client understands the importance of follow-up with providers by attending scheduled visits.       Client will verbalize knowledge of diabetes self-management as evidenced by Hgb A1C <7 or as defined by  provider.            Depression Screen    06/02/2022    9:55 AM 05/29/2021    8:51 AM 02/20/2020    1:01 PM 04/19/2019    9:30 AM 06/12/2018    4:03 PM 06/07/2017   12:22 PM 02/23/2016    9:09 AM  PHQ 2/9 Scores  PHQ - 2 Score 0 0 1 0 0 0 1  PHQ- 9 Score      0     Fall Risk    06/02/2022    9:57 AM 05/29/2021    8:52 AM 02/20/2020    1:02 PM 04/19/2019    9:30 AM 06/12/2018    4:03 PM  Fall Risk   Falls in the past year? 0 0 0 0 0  Number falls in past yr: 0 0 0 0 0  Injury with Fall? 0 0 0 0   Risk for fall due to : No Fall Risks No Fall Risks No Fall Risks    Follow up Falls prevention discussed Falls evaluation completed Falls evaluation completed Falls evaluation completed     [place]:  Any stairs in or around the home? No  If so, are there any without handrails? No  Home free of loose throw rugs in walkways, pet beds, electrical cords, etc? Yes  Adequate lighting in your home to reduce risk of falls? Yes   ASSISTIVE DEVICES UTILIZED TO PREVENT FALLS:  Life alert? Yes  Use of a cane, walker or w/c? No  Grab bars in the bathroom? Yes  Shower chair or bench in shower? Yes  Elevated toilet seat or a handicapped toilet? Yes   TIMED UP AND GO:  Was the test performed? No . Phone Visit  Cognitive Function:    06/07/2017   12:24 PM 10/29/2014   11:44 AM  MMSE - Mini Mental State Exam  Not completed:  Unable to complete  Orientation to time 5   Orientation to Place 5   Registration 3   Attention/ Calculation 5   Recall 2   Language- name 2 objects 2   Language- repeat 1   Language- follow 3 step command 3   Language- read & follow direction 1   Write a sentence 1   Copy design 1   Total score 29         06/02/2022    9:55 AM  6CIT Screen  What Year? 0 points  What month? 0 points  What time? 0 points  Count back from 20 0 points  Months in reverse 0 points  Repeat phrase 0 points  Total Score 0 points     Immunizations Immunization History  Administered Date(s) Administered   Fluad Quad(high Dose 65+) 01/17/2019, 04/30/2020, 02/23/2021   Influenza Split 01/16/2011   Influenza, High Dose Seasonal PF 01/13/2017, 02/09/2018   Influenza,inj,Quad PF,6+ Mos 02/01/2013, 02/27/2014, 04/07/2015, 01/08/2016  PFIZER(Purple Top)SARS-COV-2 Vaccination 06/06/2019, 06/27/2019, 05/21/2020   Pneumococcal Conjugate-13 02/01/2013   Pneumococcal Polysaccharide-23 10/29/2014, 06/03/2021   Td 12/06/2011   Zoster Recombinat (Shingrix) 11/21/2019, 05/12/2020    TDAP status: Due, Education has been provided regarding the importance of this vaccine. Advised may receive this vaccine at local pharmacy or Health Dept. Aware to provide a copy of the vaccination record if obtained from local pharmacy or Health Dept. Verbalized acceptance and understanding.  Flu Vaccine status: Due, Education has been provided regarding the importance of this vaccine. Advised may receive this vaccine at local pharmacy or Health Dept. Aware to provide a copy of the vaccination record if obtained from local pharmacy or Health Dept. Verbalized acceptance and understanding.  Pneumococcal vaccine status: Up to date  Covid-19 vaccine status: Completed vaccines  Qualifies for Shingles Vaccine? Yes   Zostavax completed No   Shingrix Completed?: Yes  Screening Tests Health Maintenance  Topic Date Due   OPHTHALMOLOGY EXAM  03/27/2020   Diabetic kidney evaluation - Urine ACR  04/18/2020   DTaP/Tdap/Td (2 - Tdap) 12/05/2021   INFLUENZA VACCINE  12/15/2021   COVID-19 Vaccine (4 - 2023-24 season) 01/15/2022   Lung Cancer Screening  09/22/2022   HEMOGLOBIN A1C  09/23/2022   Diabetic kidney evaluation - eGFR measurement  11/06/2022   FOOT EXAM  03/26/2023   Medicare Annual Wellness (AWV)  06/03/2023   Pneumonia Vaccine 42+ Years old  Completed   DEXA SCAN  Completed   Hepatitis C Screening  Completed   Zoster Vaccines- Shingrix   Completed   HPV VACCINES  Aged Out   COLONOSCOPY (Pts 45-63yr Insurance coverage will need to be confirmed)  Discontinued    Health Maintenance  Health Maintenance Due  Topic Date Due   OPHTHALMOLOGY EXAM  03/27/2020   Diabetic kidney evaluation - Urine ACR  04/18/2020   DTaP/Tdap/Td (2 - Tdap) 12/05/2021   INFLUENZA VACCINE  12/15/2021   COVID-19 Vaccine (4 - 2023-24 season) 01/15/2022    Colorectal cancer screening: No longer required.   Mammogram status: Completed 12/14/2021. Repeat every year  Bone Density status: Completed 04/10/2019. Results reflect: Bone density results: OSTEOPOROSIS. Repeat every 2 years.  Lung Cancer Screening: (Low Dose CT Chest recommended if Age 79-80years, 30 pack-year currently smoking OR have quit w/in 15years.) does qualify.   Lung Cancer Screening Referral: completed 09/21/2021  Additional Screening:  Hepatitis C Screening: does qualify; Completed 10/14/2015  Vision Screening: Recommended annual ophthalmology exams for early detection of glaucoma and other disorders of the eye. Is the patient up to date with their annual eye exam?  Yes  Who is the provider or what is the name of the office in which the patient attends annual eye exams? [facility] pt is not established with a provider, would they like to be referred to a provider to establish care? No .   Dental Screening: Recommended annual dental exams for proper oral hygiene  Community Resource Referral / Chronic Care Management: CRR required this visit?  No   CCM required this visit?  No      Plan:     I have personally reviewed and noted the following in the patient's chart:   Medical and social history Use of alcohol, tobacco or illicit drugs  Current medications and supplements including opioid prescriptions. Patient is not currently taking opioid prescriptions. Functional ability and status Nutritional status Physical activity Advanced directives List of other  physicians Hospitalizations, surgeries, and ER visits in previous 12 months  Vitals Screenings to include cognitive, depression, and falls Referrals and appointments  In addition, I have reviewed and discussed with patient certain preventive protocols, quality metrics, and best practice recommendations. A written personalized care plan for preventive services as well as general preventive health recommendations were provided to patient.     Sheral Flow, LPN   [id number]   Nurse Notes: N/A

## 2022-06-03 ENCOUNTER — Telehealth: Payer: Self-pay | Admitting: *Deleted

## 2022-06-03 ENCOUNTER — Ambulatory Visit (INDEPENDENT_AMBULATORY_CARE_PROVIDER_SITE_OTHER): Payer: 59 | Admitting: Internal Medicine

## 2022-06-03 ENCOUNTER — Encounter: Payer: Self-pay | Admitting: Internal Medicine

## 2022-06-03 VITALS — BP 160/74 | HR 88 | Ht 64.5 in | Wt 209.4 lb

## 2022-06-03 DIAGNOSIS — J9611 Chronic respiratory failure with hypoxia: Secondary | ICD-10-CM

## 2022-06-03 DIAGNOSIS — Z129 Encounter for screening for malignant neoplasm, site unspecified: Secondary | ICD-10-CM

## 2022-06-03 DIAGNOSIS — J449 Chronic obstructive pulmonary disease, unspecified: Secondary | ICD-10-CM | POA: Diagnosis not present

## 2022-06-03 NOTE — Patient Instructions (Addendum)
COPD, severe ([place]) Chronic respiratory failure with hypoxia ([place])  -Stable disease - last pft 2017  Plan -Continue oxygen, Trelegy and Daliresp as before. - get backpack for o2  -only lighter o2 system thatn what you have is [employer] G4 - 3.3#  our staff can try to get it for you but you might have to do it out of pocket by contacting [employer] - support motorized scooter/wheel chair  due to copd, obesity, OA - do full PFT first available with [place] - refer [place] for [facility] valve evaluation   Cancer screening  -No evidence of lung cancer in August 2020 low-dose CT scan of the chest and feb 2022 ad mar 2023  Plan -Repeat CT scan of the chest June 2024   Follow-up -June 2024 with Dr Chase Caller for routine followup

## 2022-06-03 NOTE — Telephone Encounter (Signed)
Please see telephone encounter on 04/18/21. Just continued on the encounter Brandy had sent to [place].

## 2022-06-03 NOTE — Telephone Encounter (Signed)
Victoria [name], CPhT      05/31/22  4:27 PM Note Pharmacy benefit is cheaper for the patient. Send rx to [place], [place], CPhT      05/31/22  4:26 PM Note Pharmacy Patient Advocate Encounter  Insurance verification completed.     The patient is insured through [employer]   Ran test claims for: Prolia '60mg'$ .   Pharmacy benefit copay: $0.00           Victoria [name], CPhT      05/31/22  4:23 PM Note Pt ready for scheduling on or after 06/05/22   Out-of-pocket cost due at time of visit: $301   Primary: U[employer] Medicare Prolia co-insurance: 20% (approximately $276) Admin fee co-insurance: 20% (approximately $25)   Secondary: n/a Prolia co-insurance:  Admin fee co-insurance:    Deductible: does not apply   Prior Auth: APPROVED PA# [id number] Valid: 06/23/21-06/23/22   ** This summary of benefits is an estimation of the patient's out-of-pocket cost. Exact cost may vary based on individual plan coverage.            Victoria [name], CPhT      05/31/22  4:22 PM Note

## 2022-06-03 NOTE — Telephone Encounter (Signed)
Pt is scheduled for tomorrow at 2:40 for a prolia, but do not see insurance coverage. Per chart last given 12/02/21.Marland KitchenJohny [name]

## 2022-06-03 NOTE — Progress Notes (Signed)
OV 07/20/2017  Chief Complaint  Patient presents with   Follow-up    Follow up visit. Pt also needing surgical clearance for lumpectomy.  Pt saw RB beginning February annd was givien prednisone. Pt states breathing has been tolerable with not taking prednisone. DME: [employer], 2L continuous at home 3L pulse when out     Follow-up severe COPD patient with obesity and sleep apnea.  She says she is compliant with all her COPD inhalers.  She is also compliant with her CPAP.  She is not smoking anymore.  Overall COPD CAT score is 16.  She uses oxygen all the time and CPAP at night.  She is due for breast surgery lumpectomy and is here for a preoperative evaluation.  She tells me that she is in good nutritional status.  She is not anemic.  She has got good kidney function.  She says the surgery will be elective and less than 2 hours and does not involve operating on the neck or the chest or the abdomen.  It will be done at [facility] and under experience surgeon.  Overall she is very functional and active despite his severe COPD.   OV 09/14/2017  Chief Complaint  Patient presents with   Follow-up    Pt states she has been doing better since her visit with Dr. Halford Chessman last week. Pt is currently taking an abx and just finished last dose of prednisone today. Pt's cough has become better and is still wheezing some. SOB is about the same.    Follow-up advanced COPD with obesity and sleep apnea and chronic hypoxemic respiratory failure  I last saw her in March 2019 which itself followed in exacerbation.  Since then she has had 2 visits in my office with 2 providers for COPD exacerbation.  Most recently September 09, 2017.  Chest x-ray reviewed but did not visualize and the x-ray is clear.  [Of note she had low-dose CT scan of the chest May 2018 and is due for one-year CT scan coming up anytime now] her baseline COPD CAT scan.  She is using oxygen between 2-3 L.  She feels her exacerbation is slowly  resolving but she is still symptomatic COPD CAT score is 24 and much worse than baseline although this is improved compared to last week.  She did have breast cancer surgery recently and this went well.  Currently breast cancer in remission.  Review of her medication shows that while she was in the hospital nebulized bronchodilator long-acting beta agonist was tried and she really liked it.  Therefore on September 09, 2017 visit her combination inhaler Stiolto was changed to [place] she is here to start this.and Yupelri.  Review of her chart shows that she is not on inhaled corticosteroid.  Sometime back she was on Arnuity but she is not taking it due to lack of perceived benefit.  But now she is having recurrent exacerbations.  Therefore she is open to starting it again     OV 02/09/2018  Subjective:  Patient ID: Victoria [name], female , DOB: 21-Oct-1945 , age 59 y.o. , MRN: [id number] , ADDRESS: [address] [place] [id number]   02/09/2018 -   Chief Complaint  Patient presents with   Follow-up    Pt states she has been okay since last visit. States she is still having problems becoming SOB when she exerts herself and has occ cough when she has the SOB. Denies any complaints of  wheezing or CP. Pt does develop chest tightness when she has problems with SOB.     HPI Victoria [name] 77 y.o. -with advanced COPD and chronic hypoxemic respiratory failure.  Presents for routine follow-up.  Few weeks ago she had an exacerbation recall an antibiotic and prednisone.  After that she is improved back to her baseline with COPD CAT score shown below 15 and the symptomatology described.  She has new onset of pedal edema for the last 3 days.  This is associated with increased dietary intake of soda and also salted chips.  There is no fever or rash or wheezing.  Her COPD medications include nebulizers.  She was changed to the sometime back by nurse practitioner.  She is asking about going back to inhaler  particularly Stiolto.  She is willing to try Trelegy         OV 07/20/2018  Subjective:  Patient ID: Victoria [name], female , DOB: 09/21/1945 , age 67 y.o. , MRN: [id number] , ADDRESS: [address] [place] [id number]   07/20/2018 -   Chief Complaint  Patient presents with   Follow-up    Pt states her breathing has become worse since last visit, has an occ dry cough, and also has some tightness in chest.   Follow-up) COPD with chronic hypoxemic respiratory failure.  On Trelegy, oxygen 2 L and Daliresp.  HPI Victoria [name] 77 y.o. -presents for routine follow-up.  Since last seeing me he saw a nurse practitioner for follow-up.  She tells me for the last 3 days she is has increased shortness of breath and increased chest tightness and wheezing but no increase in cough or sputum production.  This fits in with a slight increase in COPD CAT score to 18.  She takes 2 L of oxygen at home and 3 L with a portable system.  She continues on Daliresp without problems and also Trelegy inhaler.  Viral exposure or travel history -   Is negative.  Last CT scan of the chest was in June 2019 showed only emphysema.  This was for lung cancer screening.  Last pulmonary function test was in 2017.  Most recent blood work reviewed was in October 2019 and normal.   Also c/p bad knees and morning stiffness and arthralgia/arthritis for long tme. No dx opf RA     OV 01/17/2019  Subjective:  Patient ID: Victoria [name], female , DOB: 07-30-1945 , age 50 y.o. , MRN: [id number] , ADDRESS: [place] [id number]  Follow-up) COPD with chronic hypoxemic respiratory failure.  On Trelegy, oxygen 2 L and Daliresp.  01/17/2019 -  Copd followup   HPI Victoria [name] 77 y.o. -returns for follow-up of advanced COPD.  Overall she is stable.  She is doing good with social distancing.  She has not had any COPD exacerbations.  She is having some dryness of the nose with the oxygen for the last few  days and this is causing burning and irritation.  She tried some saline nasal spray yesterday and that seems to help.  She had a low-dose CT scan for lung cancer screening and her lung nodules are stable.  Repeat CT scan is recommended in 1 year.  She is compliant with her oxygen, Trelegy and Daliresp.  This combination is helping her.  COPD CAT score is 16.  She is having some economic difficulties because of the pandemic but otherwise is doing okay.    IMPRESSION: 1. Lung-RADS 2,  benign appearance or behavior. Continue annual screening with low-dose chest CT without contrast in 12 months. 2. Aortic atherosclerosis (ICD10-I70.0), coronary artery atherosclerosis and emphysema (ICD10-J43.9). 3. Similar left breast nodule of 8 mm. The patient had a negative left-sided diagnostic mammogram on 12/04/2018. 4. Similar left adrenal adenomas.     Electronically Signed   By: Abigail Miyamoto M.D.   On: 01/10/2019 14:34     OV 07/25/2019  Subjective:  Patient ID: Victoria [name], female , DOB: [birth year] , age 1 y.o. , MRN: [id number] , ADDRESS: [address] [place] [id number]   07/25/2019 -   Chief Complaint  Patient presents with   Follow-up    Pt states she has been doing okay since last visit. Pt states she still becomes SOB with activities but states breathing is stable.   Follow-up) COPD with chronic hypoxemic respiratory failure.  On Trelegy, oxygen 2 L and Daliresp.  HPI Victoria [name] 77 y.o. -presents for follow-up of her COPD and chronic hypoxic respiratory failure.  She tells me that recently she has been forgetting to use her oxygen while at rest at home a few hours later when she checks her pulse ox is 93%.  She has been surprised by how good this is.  Overall COPD stable.  When she exerts herself she feels she needs 3 L because she gets short of breath although she is not checked her pulse ox with exertion.  She is on Trelegy and Daliresp and oxygen 24/7.  Cancer  screening: Last CT scan of the chest August 2020 and this was without any evidence of lung cancer.  Follow-up in 1 year is recommended.  Out of work: I did a letter for her on the assumption she wanted to go back to work.  However she tells me that she is very afraid to go back to work.  This is because she feels she will get COVID-19 at work.  So she wants me to redo this letter.  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  At this point in time patient is vaccinated but her client is not vaccinated fully.  Declined second dose is pending at the end of this month 2021.  There are other people at work or not vaccinated.  She has managed to avoid getting Covid by staying within her safety bubble, social distancing and wearing a mask.  CDC guidelines yesterday indicate that vaccinated people can gather indoors with other vaccinated people without a mask.  In my interpretation of the CDC guidelines it appears this is more relevant to family situation as opposed to work.  She has advanced COPD and she could be at an extremely high risk of death if she were to get COVID-19.  Therefore I would support her request to stay off work for a few more months till further guidelines from [facility] regarding work-up delineated and also more people in the community are vaccinated.  Noted: She has not been to work in a year.   CAT Score 07/25/2019 09/14/2017  Total CAT Score 12 24     ROS - per HPI  04/30/2020 Follow up : COPD , O2 RF , OSA   77 year old female former smoker followed for severe gold 3 COPD and oxygen pendant respiratory failure, obstructive sleep apnea nocturnal CPAP Breast cancer history diagnosed in 2019 status post lumpectomy.  She did not receive chemo or radiation therapy. On  Arimidex.   TEST/EVENTS :  Pulmonary function test June 2017: stage III COPD FEV1 0.88 L/47% in 10/21/2015 and 0.69 L/37% later in June  2017 CT chest lung cancer screening 10/09/2015: Emphysema with very small lung nodules Echo January 2016: Shows grade 1 diastolic dysfunction Nuclear medicine cardiac stress to 12/11/2015: Normal tests Lexiscan Myoview with ejection fraction 75% PSG 12/2014 >AHI 35/h  12/2018 Lung-RADS 2, benign appearance or behavior. Continue annual screening with low-dose chest CT without contrast in 12 months.   Patient presents for a 67-monthfollow-up.  Patient has underlying severe COPD.  She remains on Trelegy daily and Daliresp daily .  Says overall breathing is doing okay. Gets winded with activity . No flare of cough or wheezing  Needs new neb machine and refills of albuterol neb. Machine is too old.   Patient participates in the low-dose CT screening program.  CT chest on January 11, 2020 showed a lung RADS 3.  She had multiple small nodules measuring up to 5.9 mm.  She has been planned for a follow-up CT chest in 6 months.  She has chronic respiratory failure is on oxygen 3 L on her portable system at 2 L at home.  Says she is had no increased oxygen demands.  She gets winded with minimum activity.  No change in her activity tolerance  She remains busy.  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   She does have sleep apnea but is CPAP intolerant.  Covid vaccines are up-to-date. Needs booster and flu shot .   OV 11/18/2020  Subjective:  Patient ID: Victoria [name] female , DOB: 906/06/1945, age 77y.o. , MRN: [id number], [id number] JJanith Lima MD Patient Care Team: JJanith Lima MD as PCP - General (Internal Medicine) KTroy Sine MD as PCP - Cardiology (Cardiology) RBrand Males MD as Consulting Physician (Pulmonary Disease) MArvella Nigh MD as Consulting Physician (Obstetrics and Gynecology) Magrinat, GVirgie Dad MD as Consulting Physician  (Oncology) WRolm Bookbinder MD as Consulting Physician (General Surgery) KGery Pray MD as Consulting Physician (Radiation Oncology) KTroy Sine MD as Consulting Physician (Cardiology) CDelice Bison LCharlestine Massed NP as Nurse Practitioner (Hematology and Oncology) Parrett, TFonnie Mu NP as Nurse Practitioner (Pulmonary Disease) ERenato Shin MD as Consulting Physician (Endocrinology) FCharlton Haws [facility] Pharmacist (Pharmacist)  This Provider for this visit: Treatment Team:  Attending Provider: RBrand Males MD    11/18/2020 -   Chief Complaint  Patient presents with   Follow-up    Pt states her breathing has become some worse since last visit. States the heat worsens her breathing.     HPI Victoria BMeghen Akopyan89y.o. -  Follow-up advanced COPD: Presents for follow-up.  She is on Trelegy and Daliresp and oxygen.  She uses oxygen 2 L at rest.  With the portable system she goes up to 3 L.  Overall she is stable without any exacerbations.  She ran out of diuretics approximately a week ago my CMA is refill this.  She is complaining that her portable oxygen system was too heavy.  She told me it is 20 pounds.  I carried it and is much lighter.  Later she admitted this was 5.6 pounds.  She wants something even lighter while doing groceries.  She is [facility].  We looked up G for model and it is  3.3 pounds with battery and she wants this.  I advised her that the insurance might not cover this.  I also advised her that if insurance covers this there she will have to return the current system which is somewhat reluctant.  I advised her that our [facility] can try to get this approved but in the absence of which she will have to try to pay for it for herself out-of-pocket which apparently cost $3000.  She will contact the company to see if there is a payment plan on this.  I advised that she could use a backpack and I gave her a reference for that.  Lung cancer  screening: No evidence of lung cancer in February 2022 CT chest.  1 year follow-up recommended.   Obesity: Following a low carbohydrate diet sheet that I recommended once before and is losing weight.  Sleep apnea: Uses CPAP at night   Mobiity - due to copd/OA/Obbesity - needs motorized wheel chair  CAT Score 07/25/2019 09/14/2017  Total CAT Score 12 24    01/14/2022 Follow up : Covid 19 infection , COPD , O2 RF  Patient presents for 2-week follow-up.  Patient was seen last visit via virtual visit.  She had tested positive for COVID-19 infection after going to a family reunion.  She was outside the window for antiviral therapy.  She was treated for an COPD exacerbation with a Z-Pak and prednisone taper.  Patient says since last visit she is feeling better.  She has decreased cough congestion.  Still remains tired and fatigued.  She remains on Trelegy inhaler daily.  She is on Daliresp daily.  She denies any hemoptysis, chest pain, orthopnea, increased edema.  She remains on oxygen 2 L.  Denies any increased oxygen demands. Uses a POC with 3l/m pulsed oxygen.  Eating well. No n/v/d. Remains tired with low energy.  Participates in Lung cancer screening program . CT chest 09/21/21 Lung Rads 2 , Emphysema , stable tiny nodules. No suspicious nodules .  Still working to get scooter has hard time getting around. Gets winded with minimal activity  Also looking at lighter weight POC as simply go is too heavy .    OV 06/03/2022  Subjective:  Patient ID: Victoria [name], female , DOB: June 09, 1945 , age 62 y.o. , MRN: [id number] , ADDRESS: [address] [place] [id number] PCP Janith Lima, MD Patient Care Team: Janith Lima, MD as PCP - General (Internal Medicine) Troy Sine, MD as PCP - Cardiology (Cardiology) Brand Males, MD as Consulting Physician (Pulmonary Disease) Arvella Nigh, MD as Consulting Physician (Obstetrics and Gynecology) Magrinat, Virgie Dad, MD (Inactive) as  Consulting Physician (Oncology) Rolm Bookbinder, MD as Consulting Physician (General Surgery) Gery Pray, MD as Consulting Physician (Radiation Oncology) Troy Sine, MD as Consulting Physician (Cardiology) Delice Bison, Charlestine Massed, NP as Nurse Practitioner (Hematology and Oncology) Parrett, Fonnie Mu, NP as Nurse Practitioner (Pulmonary Disease) Renato Shin, MD (Inactive) as Consulting Physician (Endocrinology) Tomasa Blase, [facility] (Inactive) (Pharmacist)  This Provider for this visit: Treatment Team:  Attending Provider: Brand Males, MD    06/03/2022 -   Chief Complaint  Patient presents with   Follow-up    Mobile scooter, research program for emphysema ??       HPI Victoria [name] 77 y.o. -presents for follow-up.  I personally not seen her since July 2022.  In the interim she saw a Designer, jewellery.  She missed the December 2023 appointment.  At this  point in time she continues to be stable on triple inhaler therapy and oxygen.  Her last COPD exacerbation was in summer 2023.  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  In the vending stall there is a lady who expressed to her that it was a procedure offered at [facility].  I suspect this might be the Zephyr valve procedure.  Patient was not sure.  Showed a YouTube video about this and we took a shared decision making to make a referral.  She is also inquiring about motorized wheelchair.  Last CT scan of the chest was in March 2023 and there was no evidence of lung cancer at that time.  She should have a follow-up CT scan in the spring 2024/summer 2024 range.    CT Chest data  No results found.    PFT     Latest Ref Rng & Units 04/15/2016    1:02 PM 11/05/2015   12:08 PM 10/21/2015   10:56 AM  PFT Results  FVC-Pre L 1.41  1.50  1.66   FVC-Predicted Pre % 58  63  69   FVC-Post L 1.38  1.50  1.83   FVC-Predicted Post % 57  63  77   Pre FEV1/FVC % % 45  41  44   Post FEV1/FCV % %  47  46  48   FEV1-Pre L 0.64  0.62  0.73   FEV1-Predicted Pre % 34  33  39   FEV1-Post L 0.65  0.69  0.88   DLCO uncorrected ml/min/mmHg 9.45     DLCO UNC% % 36     DLCO corrected ml/min/mmHg 9.47     DLCO COR %Predicted % 37     DLVA Predicted % 60          has a past medical history of Abdominal aneurysm ([place]), Anemia, Arthritis, Breast cancer in female [facility]), COPD (chronic obstructive pulmonary disease) ([place]), Dyspnea, Headache, Heart murmur, Hyperlipidemia, Hypertension, Multiple thyroid nodules, Murmur, cardiac (1950), Osteoporosis, Pneumonia, Pre-diabetes, Requires continuous at home supplemental oxygen, Sebaceous cyst, Sleep apnea, and Varicella (as child).   reports that she quit smoking about 11 years ago. Her smoking use included cigarettes. She has a 50.00 pack-year smoking history. She has never used smokeless tobacco.  Past Surgical History:  Procedure Laterality Date   APPENDECTOMY     2008   BREAST LUMPECTOMY Left 08/31/2017   BREAST LUMPECTOMY WITH RADIOACTIVE SEED LOCALIZATION Left 08/10/2017   Procedure: BREAST LUMPECTOMY WITH RADIOACTIVE SEED LOCALIZATION;  Surgeon: Rolm Bookbinder, MD;  Location: [place];  Service: General;  Laterality: Left;   Needles   COLONOSCOPY     COLONOSCOPY WITH PROPOFOL N/A 04/16/2016   Procedure: COLONOSCOPY WITH PROPOFOL;  Surgeon: Carol Ada, MD;  Location: [facility];  Service: Endoscopy;  Laterality: N/A;   CYST REMOVAL NECK Left 04/13/2016   Procedure: EXCISION OF SEBACEOUS CYST LEFT POSTERIOR NECK;  Surgeon: Armandina Gemma, MD;  Location: [place];  Service: General;  Laterality: Left;   Excision of Pelvic Absess, Right Ovary     2008   RE-EXCISION OF BREAST CANCER,SUPERIOR MARGINS Left 08/31/2017   Procedure: RE-EXCISION OF LEFT  BREAST MARGINS ERAS PATHWAY;  Surgeon: Rolm Bookbinder, MD;  Location: [place];  Service: General;  Laterality: Left;   TUBAL LIGATION  1980    Allergies  Allergen Reactions    Benicar [Olmesartan] Swelling    Swelling of face and arms    [name] [Valsartan]  Swelling    Swelling of face and arms    Hydrocodone-Acetaminophen Nausea And Vomiting    Severe vomiting   Lisinopril Cough   Monosodium Glutamate Other (See Comments)    Facial swelling per pt   Codeine Other (See Comments)    jittery   Lead Acetate Rash   Nickel Rash    Severe rash to infection: pt is allergic to all metals other than sterling silver or gold jewelry.     Immunization History  Administered Date(s) Administered   Fluad Quad(high Dose 65+) 01/17/2019, 04/30/2020, 02/23/2021   Influenza Split 01/16/2011   Influenza, High Dose Seasonal PF 01/13/2017, 02/09/2018   Influenza,inj,Quad PF,6+ Mos 02/01/2013, 02/27/2014, 04/07/2015, 01/08/2016   PFIZER(Purple Top)SARS-COV-2 Vaccination 06/06/2019, 06/27/2019, 05/21/2020   Pneumococcal Conjugate-13 02/01/2013   Pneumococcal Polysaccharide-23 10/29/2014, 06/03/2021   Td 12/06/2011   Zoster Recombinat (Shingrix) 11/21/2019, 05/12/2020    Family History  Problem Relation Age of Onset   Heart disease Mother        MI - fatal   Hypertension Mother    Stroke Father 75       fatal   Alzheimer's disease Father    Alzheimer's disease Brother    Hyperlipidemia Brother    Hypertension Brother    Diabetes Brother    Hypertension Brother    Hyperlipidemia Brother    Breast cancer Paternal Grandmother      Current Outpatient Medications:    Accu-Chek Softclix Lancets lancets, Use as instructed to check blood sugar 1X daily, Disp: 100 each, Rfl: 1   acetaminophen (TYLENOL) 500 MG tablet, Take 1,000 mg by mouth every 6 (six) hours as needed., Disp: , Rfl:    albuterol (PROVENTIL) (2.5 MG/3ML) 0.083% nebulizer solution, Take 3 mLs (2.5 mg total) by nebulization every 6 (six) hours as needed for wheezing or shortness of breath., Disp: 75 mL, Rfl: 3   Albuterol Sulfate (PROAIR RESPICLICK) 161 (90 Base) MCG/ACT AEPB, Inhale 1-2 puffs into the lungs  every 6 (six) hours as needed (for wheezing/shortness of breath)., Disp: 1 each, Rfl: 5   amLODipine (NORVASC) 10 MG tablet, TAKE ONE TABLET BY MOUTH ONCE DAILY, Disp: 90 tablet, Rfl: 2   anastrozole (ARIMIDEX) 1 MG tablet, Take 1 tablet (1 mg total) by mouth daily., Disp: 90 tablet, Rfl: 3   aspirin EC 81 MG tablet, Take 1 tablet (81 mg total) by mouth daily., Disp: 90 tablet, Rfl: 3   Blood Glucose Monitoring Suppl (ACCU-CHEK AVIVA PLUS) w/Device KIT, 1 Device by Does not apply route daily. Use to monitor glucose levels once per day; E11.9, Disp: 1 kit, Rfl: 0   carvedilol (COREG) 6.25 MG tablet, TAKE ONE TABLET BY MOUTH EVERY MORNING and TAKE ONE TABLET BY MOUTH EVERYDAY AT BEDTIME, Disp: 180 tablet, Rfl: 2   Cholecalciferol (VITAMIN D) 125 MCG (5000 UT) CAPS, Take 1 capsule by mouth daily., Disp: , Rfl:    DALIRESP 500 MCG TABS tablet, TAKE ONE TABLET BY MOUTH EVERY MORNING, Disp: 90 tablet, Rfl: 3   denosumab (PROLIA) 60 MG/ML SOLN injection, Inject 60 mg into the skin every 6 (six) months. Administer in upper arm, thigh, or abdomen, Disp: , Rfl:    Fluticasone-Umeclidin-Vilant (TRELEGY ELLIPTA) 100-62.5-25 MCG/ACT AEPB, INHALE 1 PUFF BY MOUTH INTO LUNGS DAILY, Disp: 60 each, Rfl: 5   glucose blood (ACCU-CHEK AVIVA PLUS) test strip, USE TO Check blood glucose THREE TIMES DAILY, Disp: 300 strip, Rfl: 5   glucose blood (ACCU-CHEK GUIDE) test strip, USE 1 STRIP ONCE DAILY,  Disp: 100 each, Rfl: 0   glucose blood test strip, USE 1 STRIP ONCE DAILY, Disp: 100 each, Rfl: 12   ibuprofen (ADVIL) 400 MG tablet, Take 1 tablet (400 mg total) by mouth every 6 (six) hours as needed., Disp: 30 tablet, Rfl: 0   OXYGEN, Inhale 2-3 L into the lungs continuous. When exerting self, Disp: , Rfl:    potassium chloride SA (KLOR-CON) 20 MEQ tablet, Take 1 tablet (20 mEq total) by mouth 2 (two) times daily., Disp: 180 tablet, Rfl: 0   rosuvastatin (CRESTOR) 40 MG tablet, TAKE ONE TABLET BY MOUTH EVERYDAY AT BEDTIME,  Disp: 90 tablet, Rfl: 2   SitaGLIPtin-MetFORMIN HCl (JANUMET XR) 50-1000 MG TB24, Take 2 tablets by mouth daily in the afternoon., Disp: 180 tablet, Rfl: 3   spironolactone (ALDACTONE) 25 MG tablet, TAKE ONE TABLET BY MOUTH ONCE DAILY, Disp: 90 tablet, Rfl: 2   thiamine (VITAMIN B-1) 100 MG tablet, Take 1 tablet (100 mg total) by mouth every other day., Disp: 45 tablet, Rfl: 1      Objective:   Vitals:   06/03/22 1507  BP: (!) 160/74  Pulse: 88  SpO2: 97%  Weight: 209 lb 6.4 oz (95 kg)  Height: 5' 4.5" (1.638 m)    Estimated body mass index is 35.39 kg/m as calculated from the following:   Height as of this encounter: 5' 4.5" (1.638 m).   Weight as of this encounter: 209 lb 6.4 oz (95 kg).  '@WEIGHTCHANGE'$ @  [employer]   06/03/22 1507  Weight: 209 lb 6.4 oz (95 kg)     Physical Exam General: No distress. Obes on wheel chair Neuro: Alert and Oriented x 3. GCS 15. Speech normal Psych: Pleasant Resp:  Barrel Chest - no.  Wheeze - no, Crackles - no, No overt respiratory distress CVS: Normal heart sounds. Murmurs - no Ext: Stigmata of Connective Tissue Disease - no HEENT: Normal upper airway. PEERL +. No post nasal drip        Assessment:       ICD-10-CM   1. COPD, severe ([place])  J44.9 CT Chest Wo Contrast    Pulmonary function test    Ambulatory Referral for DME    2. Chronic respiratory failure with hypoxia (HCC)  J96.11     3. Cancer screening  Z12.9          Plan:     Patient Instructions  COPD, severe ([place]) Chronic respiratory failure with hypoxia ([place])  -Stable disease - last pft 2017  Plan -Continue oxygen, Trelegy and Daliresp as before. - get backpack for o2  -only lighter o2 system thatn what you have is [employer] G4 - 3.3#  our staff can try to get it for you but you might have to do it out of pocket by contacting [employer] - support motorized scooter/wheel chair  due to copd, obesity, OA - do full PFT first available with [place] - refer [facility] for [facility] valve evaluation   Cancer screening  -No evidence of lung cancer in August 2020 low-dose CT scan of the chest and feb 2022 ad mar 2023  Plan -Repeat CT scan of the chest June 2024   Follow-up -June 2024 with Dr Chase Caller for routine followup    SIGNATURE    Dr. Brand Males, M.D., F.C.C.P,  Pulmonary and Critical Care Medicine Staff Physician, [place] Director - Interstitial Lung Disease  Program  [facility] [place] at [place], [place],  [place]  Pager: [phone], If no answer or between  15:00h - 7:00h: call [phone] Telephone: [phone]  5:28 PM 06/03/2022

## 2022-06-03 NOTE — Telephone Encounter (Signed)
Called pt no answer LMOM RTC concerning prolia inj.Marland KitchenJohny [name]

## 2022-06-04 ENCOUNTER — Ambulatory Visit: Payer: Medicare Other

## 2022-06-04 MED ORDER — DENOSUMAB 60 MG/ML ~~LOC~~ SOSY: 60.0000 mg | PREFILLED_SYRINGE | Freq: Once | SUBCUTANEOUS | 0 refills | Status: AC

## 2022-06-04 NOTE — Telephone Encounter (Signed)
Pharmacy benefit is $0. Can fill through [facility] and have medication shipped directly to clinic. Copay should be the same at another pharmacy, so if patient get it there, she will have to bring it in to her appointment.

## 2022-06-04 NOTE — Telephone Encounter (Signed)
Called pt w/ pharmacy team info concerning prolia.Marland KitchenJohny [name]

## 2022-06-04 NOTE — Telephone Encounter (Signed)
Patient called back and said she would rather get through her pharmacy since its a lot cheaper. She said [facility] and wants to know if she can get it through there. Please advise

## 2022-06-04 NOTE — Telephone Encounter (Signed)
Called pt back inform her not sure if [facility] is able to give injection. Pt states she didn't have to pay for the prolia in the past. Can't afford the out of pocket. She states she was on something like a scholarship for her prolia. Pt is requesting to talk w/ someone regarding coverage.Did send rx to [facility]./lmb

## 2022-06-09 ENCOUNTER — Telehealth: Payer: Self-pay | Admitting: Internal Medicine

## 2022-06-09 DIAGNOSIS — J449 Chronic obstructive pulmonary disease, unspecified: Secondary | ICD-10-CM

## 2022-06-09 DIAGNOSIS — J9611 Chronic respiratory failure with hypoxia: Secondary | ICD-10-CM

## 2022-06-09 DIAGNOSIS — M1991 Primary osteoarthritis, unspecified site: Secondary | ICD-10-CM

## 2022-06-09 NOTE — Telephone Encounter (Signed)
Looked at last AVS and the order was never placed for scooter. Placing new order now for scooter. And she was also asking about her o2 device, and noted in AVS that he said to order lighter o2 device and backpack. That order was never done either. New order placed for that as well. She is also asking when she needs ot follow up with a sleep doctor. She has full PFT on 06/16/2022. I told patient I would ask MR when he wants her to follow up with a sleep provider.   Sir please advise   I did place those two orders for her o2 and scooter sit since they were never done.

## 2022-06-09 NOTE — Telephone Encounter (Signed)
PT calling about the status of her request for her mobility scooter. ([place]) . I could not see anything but not sure where to look.   [phone] is her CB #.

## 2022-06-10 NOTE — Telephone Encounter (Signed)
Dr. Annamaria Boots, please advise when you think pt needs to have her next follow up with you.

## 2022-06-10 NOTE — Telephone Encounter (Signed)
Called and spoke with pt letting her know that we needed to get her established again with sleep doc for OSA due to last visit being around 2018 and she verbalized understanding. Pt wanted to stay at [address] location so sleep consult appt scheduled with pt on 2/20 with Dr. Annamaria Boots. Nothing further needed.

## 2022-06-10 NOTE — Telephone Encounter (Signed)
I have never seen this patient. She had seen Dr Elsworth Soho for Sleep in 2016. If she would like to see me that is fine and I can see her at any routine open slot.

## 2022-06-10 NOTE — Telephone Encounter (Signed)
Thanks for sleep followup defer the question to Dr Baird Lyons who is her sleep doc per chart reivew

## 2022-06-11 ENCOUNTER — Telehealth: Payer: Self-pay | Admitting: Internal Medicine

## 2022-06-11 NOTE — Telephone Encounter (Signed)
Pt has been scheduled for 1/30 '@2'$ .20pm for OV

## 2022-06-11 NOTE — Telephone Encounter (Signed)
Patient called and wants to know if her Prolia shot has been approved and to make sure that she does not have a $300 co pay.  Please call patient and advise.  Patients # [phone]

## 2022-06-11 NOTE — Telephone Encounter (Signed)
Per PCP pt needs an OV  Pt has been scheduled for 1/30 @ 2.20pm.

## 2022-06-11 NOTE — Telephone Encounter (Signed)
See 04/18/21 phone message in regard.   Waiting for response from PCP when Rx is sent to pharmacy.

## 2022-06-13 DIAGNOSIS — J449 Chronic obstructive pulmonary disease, unspecified: Secondary | ICD-10-CM | POA: Diagnosis not present

## 2022-06-13 DIAGNOSIS — G4733 Obstructive sleep apnea (adult) (pediatric): Secondary | ICD-10-CM | POA: Diagnosis not present

## 2022-06-13 DIAGNOSIS — I10 Essential (primary) hypertension: Secondary | ICD-10-CM | POA: Diagnosis not present

## 2022-06-15 ENCOUNTER — Ambulatory Visit (INDEPENDENT_AMBULATORY_CARE_PROVIDER_SITE_OTHER): Payer: 59 | Admitting: Internal Medicine

## 2022-06-15 ENCOUNTER — Encounter: Payer: Self-pay | Admitting: Internal Medicine

## 2022-06-15 VITALS — BP 138/68 | HR 81 | Temp 98.2°F | Resp 20 | Ht 64.5 in | Wt 208.0 lb

## 2022-06-15 DIAGNOSIS — M818 Other osteoporosis without current pathological fracture: Secondary | ICD-10-CM

## 2022-06-15 DIAGNOSIS — N1831 Chronic kidney disease, stage 3a: Secondary | ICD-10-CM

## 2022-06-15 DIAGNOSIS — I10 Essential (primary) hypertension: Secondary | ICD-10-CM

## 2022-06-15 DIAGNOSIS — Z0000 Encounter for general adult medical examination without abnormal findings: Secondary | ICD-10-CM | POA: Diagnosis not present

## 2022-06-15 DIAGNOSIS — M839 Adult osteomalacia, unspecified: Secondary | ICD-10-CM

## 2022-06-15 DIAGNOSIS — E118 Type 2 diabetes mellitus with unspecified complications: Secondary | ICD-10-CM | POA: Diagnosis not present

## 2022-06-15 DIAGNOSIS — E519 Thiamine deficiency, unspecified: Secondary | ICD-10-CM

## 2022-06-15 DIAGNOSIS — E119 Type 2 diabetes mellitus without complications: Secondary | ICD-10-CM

## 2022-06-15 DIAGNOSIS — Z0001 Encounter for general adult medical examination with abnormal findings: Secondary | ICD-10-CM

## 2022-06-15 DIAGNOSIS — Z23 Encounter for immunization: Secondary | ICD-10-CM

## 2022-06-15 LAB — CBC WITH DIFFERENTIAL/PLATELET
Basophils Absolute: 0 10*3/uL (ref 0.0–0.1)
Basophils Relative: 0.3 % (ref 0.0–3.0)
Eosinophils Absolute: 0.2 10*3/uL (ref 0.0–0.7)
Eosinophils Relative: 1.9 % (ref 0.0–5.0)
HCT: 35.5 % — ABNORMAL LOW (ref 36.0–46.0)
Hemoglobin: 11.7 g/dL — ABNORMAL LOW (ref 12.0–15.0)
Lymphocytes Relative: 18.9 % (ref 12.0–46.0)
Lymphs Abs: 1.7 10*3/uL (ref 0.7–4.0)
MCHC: 32.9 g/dL (ref 30.0–36.0)
MCV: 85.6 fl (ref 78.0–100.0)
Monocytes Absolute: 0.8 10*3/uL (ref 0.1–1.0)
Monocytes Relative: 9.1 % (ref 3.0–12.0)
Neutro Abs: 6.4 10*3/uL (ref 1.4–7.7)
Neutrophils Relative %: 69.8 % (ref 43.0–77.0)
Platelets: 218 10*3/uL (ref 150.0–400.0)
RBC: 4.14 Mil/uL (ref 3.87–5.11)
RDW: 14.9 % (ref 11.5–15.5)
WBC: 9.1 10*3/uL (ref 4.0–10.5)

## 2022-06-15 LAB — BASIC METABOLIC PANEL
BUN: 24 mg/dL — ABNORMAL HIGH (ref 6–23)
CO2: 34 mEq/L — ABNORMAL HIGH (ref 19–32)
Calcium: 9.9 mg/dL (ref 8.4–10.5)
Chloride: 99 mEq/L (ref 96–112)
Creatinine, Ser: 0.93 mg/dL (ref 0.40–1.20)
GFR: 58.93 mL/min — ABNORMAL LOW (ref >60.00)
Glucose, Bld: 144 mg/dL — ABNORMAL HIGH (ref 70–99)
Potassium: 4.1 mEq/L (ref 3.5–5.1)
Sodium: 138 mEq/L (ref 135–145)

## 2022-06-15 LAB — MICROALBUMIN / CREATININE URINE RATIO
Creatinine,U: 170.8 mg/dL
Microalb Creat Ratio: 2.7 mg/g (ref 0.0–30.0)
Microalb, Ur: 4.6 mg/dL — ABNORMAL HIGH (ref 0.0–1.9)

## 2022-06-15 LAB — VITAMIN D 25 HYDROXY (VIT D DEFICIENCY, FRACTURES): VITD: 21.11 ng/mL — ABNORMAL LOW (ref 30.00–100.00)

## 2022-06-15 NOTE — Patient Instructions (Signed)

## 2022-06-15 NOTE — Progress Notes (Signed)
Subjective:  Patient ID: Victoria [name], female    DOB: Sep 21, 1945  Age: 77 y.o. MRN: [id number]  CC: Annual Exam, Anemia, COPD, Diabetes, Hyperlipidemia, and Hypertension   HPI Victoria Lateia Fraser presents for a CPX and f/up -  Her shortness of breath is unchanged and she is doing well on continuous O2.  She denies chest pain, shortness of breath, diaphoresis, or edema.  Outpatient Medications Prior to Visit  Medication Sig Dispense Refill   acetaminophen (TYLENOL) 500 MG tablet Take 1,000 mg by mouth every 6 (six) hours as needed.     albuterol (PROVENTIL) (2.5 MG/3ML) 0.083% nebulizer solution Take 3 mLs (2.5 mg total) by nebulization every 6 (six) hours as needed for wheezing or shortness of breath. 75 mL 3   Albuterol Sulfate (PROAIR RESPICLICK) 742 (90 Base) MCG/ACT AEPB Inhale 1-2 puffs into the lungs every 6 (six) hours as needed (for wheezing/shortness of breath). 1 each 5   amLODipine (NORVASC) 10 MG tablet TAKE ONE TABLET BY MOUTH ONCE DAILY 90 tablet 2   anastrozole (ARIMIDEX) 1 MG tablet Take 1 tablet (1 mg total) by mouth daily. 90 tablet 3   aspirin EC 81 MG tablet Take 1 tablet (81 mg total) by mouth daily. 90 tablet 3   Blood Glucose Monitoring Suppl (ACCU-CHEK AVIVA PLUS) w/Device KIT 1 Device by Does not apply route daily. Use to monitor glucose levels once per day; E11.9 1 kit 0   carvedilol (COREG) 6.25 MG tablet TAKE ONE TABLET BY MOUTH EVERY MORNING and TAKE ONE TABLET BY MOUTH EVERYDAY AT BEDTIME 180 tablet 2   DALIRESP 500 MCG TABS tablet TAKE ONE TABLET BY MOUTH EVERY MORNING 90 tablet 3   Fluticasone-Umeclidin-Vilant (TRELEGY ELLIPTA) 100-62.5-25 MCG/ACT AEPB INHALE 1 PUFF BY MOUTH INTO LUNGS DAILY 60 each 5   glucose blood (ACCU-CHEK AVIVA PLUS) test strip USE TO Check blood glucose THREE TIMES DAILY 300 strip 5   glucose blood (ACCU-CHEK GUIDE) test strip USE 1 STRIP ONCE DAILY 100 each 0   glucose blood test strip USE 1 STRIP ONCE DAILY 100 each 12    OXYGEN Inhale 2-3 L into the lungs continuous. When exerting self     potassium chloride SA (KLOR-CON) 20 MEQ tablet Take 1 tablet (20 mEq total) by mouth 2 (two) times daily. 180 tablet 0   rosuvastatin (CRESTOR) 40 MG tablet TAKE ONE TABLET BY MOUTH EVERYDAY AT BEDTIME 90 tablet 2   SitaGLIPtin-MetFORMIN HCl (JANUMET XR) 50-1000 MG TB24 Take 2 tablets by mouth daily in the afternoon. 180 tablet 3   spironolactone (ALDACTONE) 25 MG tablet TAKE ONE TABLET BY MOUTH ONCE DAILY 90 tablet 2   Accu-Chek Softclix Lancets lancets Use as instructed to check blood sugar 1X daily 100 each 1   Cholecalciferol (VITAMIN D) 125 MCG (5000 UT) CAPS Take 1 capsule by mouth daily.     denosumab (PROLIA) 60 MG/ML SOLN injection Inject 60 mg into the skin every 6 (six) months. Administer in upper arm, thigh, or abdomen     ibuprofen (ADVIL) 400 MG tablet Take 1 tablet (400 mg total) by mouth every 6 (six) hours as needed. 30 tablet 0   thiamine (VITAMIN B-1) 100 MG tablet Take 1 tablet (100 mg total) by mouth every other day. 45 tablet 1   No facility-administered medications prior to visit.    ROS Review of Systems  Constitutional:  Positive for fatigue. Negative for chills, diaphoresis, fever and unexpected weight change.  HENT: Negative.  Eyes: Negative.   Respiratory:  Positive for shortness of breath and wheezing. Negative for cough and chest tightness.   Cardiovascular:  Negative for chest pain, palpitations and leg swelling.  Gastrointestinal:  Negative for abdominal pain, constipation, diarrhea, nausea and vomiting.  Genitourinary:  Negative for difficulty urinating and dysuria.  Musculoskeletal:  Positive for gait problem. Negative for arthralgias and myalgias.  Skin: Negative.   Neurological:  Negative for dizziness, weakness and light-headedness.  Hematological:  Negative for adenopathy. Does not bruise/bleed easily.  Psychiatric/Behavioral: Negative.      Objective:  BP 138/68 (BP Location:  Left Arm, Patient Position: Sitting, Cuff Size: Large)   Pulse 81   Temp 98.2 F (36.8 C) (Oral)   Ht 5' 4.5" (1.638 m)   SpO2 90%   BMI 35.39 kg/m   BP Readings from Last 3 Encounters:  06/15/22 138/68  06/03/22 (!) 160/74  03/25/22 124/76    Wt Readings from Last 3 Encounters:  06/03/22 209 lb 6.4 oz (95 kg)  06/02/22 196 lb (88.9 kg)  03/25/22 206 lb (93.4 kg)    Physical Exam Vitals reviewed.  Constitutional:      Appearance: She is ill-appearing (in a wheelchair, continuous oxygen).  HENT:     Nose: Nose normal.     Mouth/Throat:     Mouth: Mucous membranes are moist.  Eyes:     General: No scleral icterus.    Conjunctiva/sclera: Conjunctivae normal.  Cardiovascular:     Rate and Rhythm: Normal rate and regular rhythm.     Heart sounds: No murmur heard. Pulmonary:     Effort: Accessory muscle usage present. No tachypnea or respiratory distress.     Breath sounds: Examination of the right-upper field reveals decreased breath sounds. Examination of the left-upper field reveals decreased breath sounds. Examination of the right-middle field reveals decreased breath sounds. Examination of the left-middle field reveals decreased breath sounds. Examination of the right-lower field reveals decreased breath sounds. Examination of the left-lower field reveals decreased breath sounds. Decreased breath sounds present. No wheezing, rhonchi or rales.  Abdominal:     General: Abdomen is flat.     Palpations: There is no mass.     Tenderness: There is no abdominal tenderness. There is no guarding.     Hernia: No hernia is present.  Musculoskeletal:        General: Normal range of motion.     Cervical back: Neck supple.     Right lower leg: Edema (trace pitting) present.     Left lower leg: Edema (trace pitting) present.  Lymphadenopathy:     Cervical: No cervical adenopathy.  Skin:    General: Skin is warm and dry.  Neurological:     General: No focal deficit present.      Mental Status: She is alert.  Psychiatric:        Mood and Affect: Mood normal.        Behavior: Behavior normal.     Lab Results  Component Value Date   WBC 9.1 06/15/2022   HGB 11.7 (L) 06/15/2022   HCT 35.5 (L) 06/15/2022   PLT 218.0 06/15/2022   GLUCOSE 144 (H) 06/15/2022   CHOL 110 11/10/2021   TRIG 102 11/10/2021   HDL 51 11/10/2021   LDLDIRECT 121.0 09/05/2014   LDLCALC 40 11/10/2021   ALT 28 11/05/2021   AST 39 11/05/2021   NA 138 06/15/2022   K 4.1 06/15/2022   CL 99 06/15/2022   CREATININE 0.93 06/15/2022  BUN 24 (H) 06/15/2022   CO2 34 (H) 06/15/2022   TSH 1.80 03/25/2022   INR 1.0 01/25/2021   HGBA1C 7.1 (A) 03/25/2022   MICROALBUR 4.6 (H) 06/15/2022    MM DIAG BREAST TOMO BILATERAL  Result Date: 12/14/2021 CLINICAL DATA:  Status post left lumpectomy breast cancer in 2019. EXAM: DIGITAL DIAGNOSTIC BILATERAL MAMMOGRAM WITH TOMOSYNTHESIS TECHNIQUE: Bilateral digital diagnostic mammography and breast tomosynthesis was performed. COMPARISON:  Previous exam(s). ACR Breast Density Category b: There are scattered areas of fibroglandular density. FINDINGS: Stable post lumpectomy changes on the left. No interval findings suspicious for malignancy. IMPRESSION: No evidence of malignancy. RECOMMENDATION: Per protocol, as the patient is now 2 or more years status post lumpectomy, she may return to annual screening mammography in 1 year. However, given the history of breast cancer, the patient remains eligible for annual diagnostic mammography if preferred. I have discussed the findings and recommendations with the patient. If applicable, a reminder letter will be sent to the patient regarding the next appointment. BI-RADS CATEGORY  2: Benign. Electronically Signed   By: Claudie Revering M.D.   On: 12/14/2021 11:54   Assessment & Plan:   Alanya was seen today for annual exam, anemia, copd, diabetes, hyperlipidemia and hypertension.  Diagnoses and all orders for this  visit:  Essential hypertension- Her blood pressure is well-controlled. -     Urinalysis, Routine w reflex microscopic; Future -     Basic metabolic panel; Future -     CBC with Differential/Platelet; Future -     CBC with Differential/Platelet -     Basic metabolic panel -     Urinalysis, Routine w reflex microscopic  Type II diabetes mellitus with manifestations ([place])- Her blood sugar is adequately well-controlled.  Will add an SGLT2 inhibitor for renal protection. -     Urinalysis, Routine w reflex microscopic; Future -     Microalbumin / creatinine urine ratio; Future -     Basic metabolic panel; Future -     Basic metabolic panel -     Microalbumin / creatinine urine ratio -     Urinalysis, Routine w reflex microscopic -     dapagliflozin propanediol (FARXIGA) 10 MG TABS tablet; Take 1 tablet (10 mg total) by mouth daily before breakfast.  Thiamine deficiency -     CBC with Differential/Platelet; Future -     CBC with Differential/Platelet -     thiamine (VITAMIN B-1) 100 MG tablet; Take 1 tablet (100 mg total) by mouth every other day.  Other osteoporosis without current pathological fracture -     VITAMIN D 25 Hydroxy (Vit-D Deficiency, Fractures); Future -     Basic metabolic panel; Future -     Basic metabolic panel -     VITAMIN D 25 Hydroxy (Vit-D Deficiency, Fractures) -     denosumab (PROLIA) 60 MG/ML SOSY injection; Inject 60 mg into the skin every 6 (six) months.  Encounter for general adult medical examination with abnormal findings- Exam completed, labs reviewed, vaccines reviewed and updated, no cancer screenings indicated, patient education was given.  Flu vaccine need -     Flu Vaccine QUAD High Dose(Fluad)  Vitamin D deficient osteomalacia -     Cholecalciferol (VITAMIN D3) 125 MCG (5000 UT) CAPS; Take 1 capsule (5,000 Units total) by mouth daily.  Stage 3a chronic kidney disease (HCC) -     dapagliflozin propanediol (FARXIGA) 10 MG TABS tablet; Take 1  tablet (10 mg total) by mouth daily before breakfast.  Type 2 diabetes mellitus without complication, without long-term current use of insulin (HCC)   I have discontinued Trinetta B. Kingsley's denosumab and ibuprofen. I am also having her start on Vitamin D3, Prolia, and dapagliflozin propanediol. Additionally, I am having her maintain her OXYGEN, acetaminophen, Accu-Chek Aviva Plus, aspirin EC, glucose blood, ProAir RespiClick, Accu-Chek Guide, potassium chloride SA, Daliresp, anastrozole, albuterol, spironolactone, rosuvastatin, carvedilol, amLODipine, Janumet XR, Trelegy Ellipta, Accu-Chek Aviva Plus, and thiamine.  Meds ordered this encounter  Medications   Cholecalciferol (VITAMIN D3) 125 MCG (5000 UT) CAPS    Sig: Take 1 capsule (5,000 Units total) by mouth daily.    Dispense:  90 capsule    Refill:  1   denosumab (PROLIA) 60 MG/ML SOSY injection    Sig: Inject 60 mg into the skin every 6 (six) months.    Dispense:  1 mL    Refill:  1   thiamine (VITAMIN B-1) 100 MG tablet    Sig: Take 1 tablet (100 mg total) by mouth every other day.    Dispense:  45 tablet    Refill:  1   dapagliflozin propanediol (FARXIGA) 10 MG TABS tablet    Sig: Take 1 tablet (10 mg total) by mouth daily before breakfast.    Dispense:  90 tablet    Refill:  1     Follow-up: Return in about 6 months (around 12/14/2022).  Scarlette Calico, MD

## 2022-06-16 ENCOUNTER — Telehealth: Payer: Self-pay

## 2022-06-16 ENCOUNTER — Other Ambulatory Visit: Payer: Self-pay

## 2022-06-16 ENCOUNTER — Telehealth: Payer: Self-pay | Admitting: Internal Medicine

## 2022-06-16 ENCOUNTER — Other Ambulatory Visit (HOSPITAL_COMMUNITY): Payer: Self-pay

## 2022-06-16 DIAGNOSIS — E118 Type 2 diabetes mellitus with unspecified complications: Secondary | ICD-10-CM

## 2022-06-16 DIAGNOSIS — Z23 Encounter for immunization: Secondary | ICD-10-CM | POA: Insufficient documentation

## 2022-06-16 DIAGNOSIS — N1831 Chronic kidney disease, stage 3a: Secondary | ICD-10-CM | POA: Insufficient documentation

## 2022-06-16 DIAGNOSIS — M839 Adult osteomalacia, unspecified: Secondary | ICD-10-CM | POA: Insufficient documentation

## 2022-06-16 LAB — URINALYSIS, ROUTINE W REFLEX MICROSCOPIC
Bilirubin Urine: NEGATIVE
Ketones, ur: NEGATIVE
Leukocytes,Ua: NEGATIVE
Nitrite: NEGATIVE
Specific Gravity, Urine: 1.025 (ref 1.000–1.030)
Total Protein, Urine: NEGATIVE
Urine Glucose: NEGATIVE
Urobilinogen, UA: 0.2 (ref 0.0–1.0)
pH: 6 (ref 5.0–8.0)

## 2022-06-16 MED ORDER — VITAMIN D3 125 MCG (5000 UT) PO CAPS: 5000.0000 [IU] | ORAL_CAPSULE | Freq: Every day | ORAL | 1 refills | Status: DC

## 2022-06-16 MED ORDER — ACCU-CHEK SOFTCLIX LANCETS MISC: 1 refills | Status: DC

## 2022-06-16 MED ORDER — VITAMIN B-1 100 MG PO TABS: 100.0000 mg | ORAL_TABLET | ORAL | 1 refills | Status: DC

## 2022-06-16 MED ORDER — DAPAGLIFLOZIN PROPANEDIOL 10 MG PO TABS: 10.0000 mg | ORAL_TABLET | Freq: Every day | ORAL | 1 refills | Status: DC

## 2022-06-16 MED ORDER — PROLIA 60 MG/ML ~~LOC~~ SOSY
60.0000 mg | PREFILLED_SYRINGE | SUBCUTANEOUS | 1 refills | Status: DC
  Filled 2022-06-16 (×2): qty 1, 180d supply, fill #0

## 2022-06-16 NOTE — Telephone Encounter (Signed)
Patient called and has developed a cough, not sure if it is due to her flu shot she received on yesterday at her appointment. She is not sure if she needs a cough syrup or if she may need to be seen again. Patient would like a callback to discuss at [phone].

## 2022-06-16 NOTE — Telephone Encounter (Signed)
Script updated

## 2022-06-17 ENCOUNTER — Telehealth: Payer: Self-pay | Admitting: Adult Health

## 2022-06-17 ENCOUNTER — Telehealth: Payer: Self-pay | Admitting: Internal Medicine

## 2022-06-17 NOTE — Telephone Encounter (Signed)
Pt called regarding lab results and wanting a call back.

## 2022-06-23 ENCOUNTER — Ambulatory Visit: Payer: 59

## 2022-06-23 ENCOUNTER — Ambulatory Visit (INDEPENDENT_AMBULATORY_CARE_PROVIDER_SITE_OTHER): Payer: 59 | Admitting: *Deleted

## 2022-06-23 DIAGNOSIS — M810 Age-related osteoporosis without current pathological fracture: Secondary | ICD-10-CM

## 2022-06-23 MED ORDER — DENOSUMAB 60 MG/ML ~~LOC~~ SOSY
60.0000 mg | PREFILLED_SYRINGE | Freq: Once | SUBCUTANEOUS | Status: AC
  Administered 2022-06-23: 60 mg via SUBCUTANEOUS

## 2022-06-23 NOTE — Progress Notes (Signed)
Pls cosign for prolia inj../lmb 

## 2022-07-02 ENCOUNTER — Telehealth: Payer: Self-pay | Admitting: Internal Medicine

## 2022-07-02 NOTE — Telephone Encounter (Signed)
Patient called to ask the doctor if she should have her walk test before her scheduled appt. On 2/20 or should she have it after her appt.  They are supposed to come to the office to give her the walk test and she wants know if she should have it before or after her appt.  Please call to discuss further at [phone]

## 2022-07-02 NOTE — Telephone Encounter (Signed)
Spoke with patient she was confused as to what her appointment Tuesday would be I advised patient she would see Dr. Annamaria Boots to re-establish as a sleep patient. Patient also advises she had recently been approved for a poc machine and was told by [employer] she needed to complete a walk test for insurance purposes she states [employer] wanted to come her to complete the test after her appointment. I advised I would speak with Dr. Annamaria Boots on Monday and give her a call back.

## 2022-07-04 NOTE — Progress Notes (Deleted)
 @  Patient ID: Victoria [name], female    DOB: [birth year], 77 y.o.   MRN: [id number]  No chief complaint on file.   Referring provider: Janith Lima, MD  HPI: 77 year old female former smoker followed for COPD and oxygen dependent respiratory failure Medical history significant for breast cancer 2019 status post lumpectomy and diabetes OSA-CPAP intolerant   TEST/EVENTS :   07/04/2022 Follow up : Covid 19 infection , COPD , O2 RF  Patient presents for 2-week follow-up.  Patient was seen last visit via virtual visit.  She had tested positive for COVID-19 infection after going to a family reunion.  She was outside the window for antiviral therapy.  She was treated for an COPD exacerbation with a Z-Pak and prednisone taper.  Patient says since last visit she is feeling better.  She has decreased cough congestion.  Still remains tired and fatigued.  She remains on Trelegy inhaler daily.  She is on Daliresp daily.  She denies any hemoptysis, chest pain, orthopnea, increased edema.  She remains on oxygen 2 L.  Denies any increased oxygen demands. Uses a POC with 3l/m pulsed oxygen.  Eating well. No n/v/d. Remains tired with low energy.  Participates in Lung cancer screening program . CT chest 09/21/21 Lung Rads 2 , Emphysema , stable tiny nodules. No suspicious nodules .  Still working to get scooter has hard time getting around. Gets winded with minimal activity  Also looking at lighter weight POC as simply go is too heavy .   07/06/22- 76yoF for sleep evaluation with concern of Medical problem list Epworth score- Body weight today- NPSG 2016   Assessment & Plan:   No problem-specific Assessment & Plan notes found for this encounter.     Baird Lyons, MD 07/04/2022

## 2022-07-06 ENCOUNTER — Institutional Professional Consult (permissible substitution): Payer: 59 | Admitting: Internal Medicine

## 2022-07-06 ENCOUNTER — Telehealth: Payer: Self-pay | Admitting: Internal Medicine

## 2022-07-06 NOTE — Telephone Encounter (Signed)
Patient would like the nurse to call regarding adding a walk test onto her appt. On 08/19/22.  She stated that [employer] wants her to add the walk test to his appt.  Please advise and call patient to discuss further at [phone]

## 2022-07-07 NOTE — Telephone Encounter (Signed)
Spoke to pt and she stated she wanted a lighter weight poc from [employer] and spoke to a [employer] rep that told her she would have to have a 6 min walk test for a new poc and would also have to pay out of pocket for the lighter weight poc. I asked pt if she wanted me to sch. the 6 min walk test but pt stated she wouldn't be able to do that test after I informed her of what the 6 min walk test entails. Pt decided she did not want the [employer] poc, do to cost. I informed her I will call [employer] and inform them of pt's decision. Nothing further needed.

## 2022-07-09 ENCOUNTER — Telehealth: Payer: Self-pay

## 2022-07-09 NOTE — Telephone Encounter (Signed)
Pt states she noticed that when she takes the  it makes her dizzy. Pt states she has to sit down and it will go away after a while. Pt states the medication does read may cause dizziness.  *Pt is wanting to know what is the providers suggestion.

## 2022-07-09 NOTE — Telephone Encounter (Signed)
Pt has called back and informed me that the medication was [place]. I have recommended that she d/c med until PCP can advise.

## 2022-07-11 ENCOUNTER — Other Ambulatory Visit: Payer: Self-pay | Admitting: Internal Medicine

## 2022-07-13 NOTE — Telephone Encounter (Signed)
Pt has called back in and was informed that Rx has no alternative.   She asked what it was prescribed for so I informed her that it was Rx'd for DM and kidney disease per Epic.   Pt stated that she was not aware or informed that she had kidney disease.   Pt would would like an explanation from PCP to help her understand what is going on with her health as she has voiced concern.   Please advise.

## 2022-07-13 NOTE — Telephone Encounter (Signed)
Called pt, LVM to discuss.  

## 2022-07-13 NOTE — Telephone Encounter (Signed)
Patient would like to know if there is a medication to take in place of [place]. Best callback number is [phone].

## 2022-07-14 DIAGNOSIS — I10 Essential (primary) hypertension: Secondary | ICD-10-CM | POA: Diagnosis not present

## 2022-07-14 DIAGNOSIS — J449 Chronic obstructive pulmonary disease, unspecified: Secondary | ICD-10-CM | POA: Diagnosis not present

## 2022-07-14 DIAGNOSIS — G4733 Obstructive sleep apnea (adult) (pediatric): Secondary | ICD-10-CM | POA: Diagnosis not present

## 2022-07-14 NOTE — Telephone Encounter (Signed)
Patient called back and is still waiting a response

## 2022-07-15 NOTE — Telephone Encounter (Signed)
I have spoken to the pt. I have offered her an OV to discuss with PCP in person. PT declined and stated that she would follow up with her endocrinologist.

## 2022-07-15 NOTE — Telephone Encounter (Signed)
Patient called back and is extremely upset with Dr. Ronnald Ramp that he has not gotten back in touch with her.  She expressed gratitude for Shirron at being so nice and helpful.  She would like Shirron to call her back this afternoon!

## 2022-07-19 ENCOUNTER — Encounter: Payer: Self-pay | Admitting: Internal Medicine

## 2022-07-19 ENCOUNTER — Ambulatory Visit (INDEPENDENT_AMBULATORY_CARE_PROVIDER_SITE_OTHER): Payer: 59 | Admitting: Internal Medicine

## 2022-07-19 ENCOUNTER — Other Ambulatory Visit: Payer: Self-pay | Admitting: Internal Medicine

## 2022-07-19 VITALS — BP 124/78 | HR 82 | Ht 64.5 in | Wt 210.0 lb

## 2022-07-19 DIAGNOSIS — I714 Abdominal aortic aneurysm, without rupture, unspecified: Secondary | ICD-10-CM

## 2022-07-19 DIAGNOSIS — E119 Type 2 diabetes mellitus without complications: Secondary | ICD-10-CM | POA: Diagnosis not present

## 2022-07-19 LAB — POCT GLUCOSE (DEVICE FOR HOME USE): POC Glucose: 147 mg/dl — AB (ref 70–99)

## 2022-07-19 LAB — POCT GLYCOSYLATED HEMOGLOBIN (HGB A1C): Hemoglobin A1C: 7.2 % — AB (ref 4.0–5.6)

## 2022-07-19 MED ORDER — JANUMET XR 50-1000 MG PO TB24: 2.0000 | ORAL_TABLET | Freq: Every day | ORAL | 3 refills | Status: DC

## 2022-07-19 NOTE — Telephone Encounter (Signed)
Pt stated she would like a referral for her follow up about abdominal aneurysm with [facility]. Pt stated she was a pt of Dr. Oneida Alar however he has retired now. Please advise.

## 2022-07-19 NOTE — Patient Instructions (Addendum)
Take Janumet 2 tablets before Supper  Take Farxiga 10 mg, half a tablet every morning, if you develop dizziness, please stop it    HOW TO TREAT LOW BLOOD SUGARS (Blood sugar LESS THAN 70 MG/DL) Please follow the RULE OF 15 for the treatment of hypoglycemia treatment (when your (blood sugars are less than 70 mg/dL)   STEP 1: Take 15 grams of carbohydrates when your blood sugar is low, which includes:  3-4 GLUCOSE TABS  OR 3-4 OZ OF JUICE OR REGULAR SODA OR ONE TUBE OF GLUCOSE GEL    STEP 2: RECHECK blood sugar in 15 MINUTES STEP 3: If your blood sugar is still low at the 15 minute recheck --> then, go back to STEP 1 and treat AGAIN with another 15 grams of carbohydrates.

## 2022-07-19 NOTE — Telephone Encounter (Signed)
PT calls today following up on this matter. PT had expressed they were still seeing the endocrinologist today. PT wanted to apologize for their attitude in the past and the confusion they had with the farxiga. PT stated they had not gotten to see the results of their labs and was not aware as to why they had been put on the [place] (PT has not set up their MyChart and had not gotten a print out). This medication is still causing dizziness for the PT and so they have stopped taking it. Whenever possible PT would like a call back to go over these results.  CB: [phone]

## 2022-07-19 NOTE — Progress Notes (Signed)
Name: Victoria [name]  Age/ Sex: 77 y.o., female   MRN/ DOB: [id number], [birth year]     PCP: Janith Lima, MD   Reason for Endocrinology Evaluation: Type 2 Diabetes Mellitus  Initial Endocrine Consultative Visit: 02/28/2014    PATIENT IDENTIFIER: Victoria [name] is a 77 y.o. female with a past medical history of OSA, COPD, HTN, Hx of breast Ca. The patient has followed with Endocrinology clinic since 02/28/2014 for consultative assistance with management of her diabetes.  DIABETIC HISTORY:  Victoria [name] was diagnosed with DM 2017. Her hemoglobin A1c has ranged from 6.3% in 2022, peaking at 10.3% in 2020.   PCP prescribed Farxiga 05/2022 but pt developed dizziness to 10 mg dose.    THYROID HISTORY: The patient has been diagnosed with hyperthyroid in 2019.  Thyroid ultrasound showed multinodular goiter.   She is s/p benign FNA of the isthmic nodule in May 2006 and in 2018 She is S/P benign FNA of right mid thyroid nodule 02/2015 and in 2019   Thyroid ultrasound on 03/02/2021 showed 6 year stability of multiple thyroid nodules and NO further ultrasound was recommended  She is S/P RAI ablation 31.5 mCi I-131 sodium iodide orally  03/19/2019  TFT's have been normal    SUBJECTIVE:   During the last visit (03/25/2022): A1c 7.1%   Today (07/19/2022): Victoria [name]  is here for a follow up on diabetes management. She checks her blood sugars 1 times daily. The patient has not had hypoglycemic episodes since the last clinic visit  Pt had a follow up with pulmonology 05/2022 for COPD  She is on oxygen 24 hrs a day, she has a pending referral to [facility] per patient  She was prescribed farxiga by PCP but she developed dizziness  Denies constipation nor diarrhea  She has history of aortic aneurysm, she has contact information to a new vascular surgeon as her previous one has retired  HOME DIABETES REGIMEN:  Janumet 50-1000 2 tabs daily     Statin: yes ACE-I/ARB: no   METER  DOWNLOAD SUMMARY: Did not bring       DIABETIC COMPLICATIONS: Microvascular complications:   Denies: CKD, retinopathy  Last Eye Exam: Completed 2022  Macrovascular complications:   Denies: CAD, CVA, PVD   HISTORY:  Past Medical History:  Past Medical History:  Diagnosis Date   Abdominal aneurysm ([place])    Dr. Oneida Alar follows lLOV 2 ''17 per pt "around 2 cm"   Anemia    as a child   Arthritis    left ankle, right knee, right SI joint, wrists, lower back   Breast cancer in female [facility])    Left   COPD (chronic obstructive pulmonary disease) ([employer])    ephysema-Dr. 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 Harlow Asa in [place] [facility]   Sleep apnea    cpap used sometimes, uses oxygen concentrator 2 l/m nasally bedtime   Varicella as child   Past Surgical History:  Past Surgical  History:  Procedure Laterality Date   APPENDECTOMY     2008   BREAST LUMPECTOMY Left 08/31/2017   BREAST LUMPECTOMY WITH RADIOACTIVE SEED LOCALIZATION Left 08/10/2017   Procedure: BREAST LUMPECTOMY WITH RADIOACTIVE SEED LOCALIZATION;  Surgeon: Rolm Bookbinder, MD;  Location: [place];  Service: General;  Laterality: Left;   [place]   COLONOSCOPY     COLONOSCOPY WITH PROPOFOL N/A 04/16/2016   Procedure: COLONOSCOPY WITH PROPOFOL;  Surgeon: Carol Ada, MD;  Location: [facility];  Service: Endoscopy;  Laterality: N/A;   CYST REMOVAL NECK Left 04/13/2016   Procedure: EXCISION OF SEBACEOUS CYST LEFT POSTERIOR NECK;  Surgeon: Armandina Gemma, MD;  Location: [place];  Service: General;  Laterality: Left;    Excision of Pelvic Absess, Right Ovary     2008   RE-EXCISION OF BREAST CANCER,SUPERIOR MARGINS Left 08/31/2017   Procedure: RE-EXCISION OF LEFT  BREAST MARGINS ERAS PATHWAY;  Surgeon: Rolm Bookbinder, MD;  Location: [place];  Service: General;  Laterality: Left;   TUBAL LIGATION  1980   Social History:  reports that she quit smoking about 11 years ago. Her smoking use included cigarettes. She has a 50.00 pack-year smoking history. She has never used smokeless tobacco. She reports current alcohol use. She reports that she does not use drugs. Family History:  Family History  Problem Relation Age of Onset   Heart disease Mother        MI - fatal   Hypertension Mother    Stroke Father 74       fatal   Alzheimer's disease Father    Alzheimer's disease Brother    Hyperlipidemia Brother    Hypertension Brother    Diabetes Brother    Hypertension Brother    Hyperlipidemia Brother    Breast cancer Paternal Grandmother      HOME MEDICATIONS: Allergies as of 07/19/2022       Reactions   Benicar [olmesartan] Swelling   Swelling of face and arms    [name] [valsartan] Swelling   Swelling of face and arms    Hydrocodone-acetaminophen Nausea And Vomiting   Severe vomiting   Lisinopril Cough   Monosodium Glutamate Other (See Comments)   Facial swelling per pt   Codeine Other (See Comments)   jittery   Lead Acetate Rash   Nickel Rash   Severe rash to infection: pt is allergic to all metals other than sterling silver or gold jewelry.         Medication List        Accurate as of July 19, 2022  1:21 PM. If you have any questions, ask your nurse or doctor.          Accu-Chek Aviva Plus w/Device Kit 1 Device by Does not apply route daily. Use to monitor glucose levels once per day; E11.9   Accu-Chek Softclix Lancets lancets Use as instructed to check blood sugar 3X daily   acetaminophen 500 MG tablet Commonly known as: TYLENOL Take 1,000 mg by mouth every 6 (six) hours  as needed.   amLODipine 10 MG tablet Commonly known as: NORVASC TAKE ONE TABLET BY MOUTH ONCE DAILY   anastrozole 1 MG tablet Commonly known as: ARIMIDEX Take 1 tablet (1 mg total) by mouth daily.   aspirin EC 81 MG tablet Take 1 tablet (81 mg total) by mouth daily.   carvedilol 6.25 MG tablet Commonly known as: COREG TAKE ONE TABLET BY MOUTH EVERY MORNING and TAKE ONE TABLET BY MOUTH  EVERYDAY AT BEDTIME   Daliresp 500 MCG Tabs tablet Generic drug: roflumilast TAKE ONE TABLET BY MOUTH EVERY MORNING   glucose blood test strip USE 1 STRIP ONCE DAILY   Accu-Chek Guide test strip Generic drug: glucose blood USE 1 STRIP ONCE DAILY   Accu-Chek Aviva Plus test strip Generic drug: glucose blood USE TO Check blood glucose THREE TIMES DAILY   Janumet XR 50-1000 MG Tb24 Generic drug: SitaGLIPtin-MetFORMIN HCl Take 2 tablets by mouth daily in the afternoon.   OXYGEN Inhale 2-3 L into the lungs continuous. When exerting self   potassium chloride SA 20 MEQ tablet Commonly known as: KLOR-CON M Take 1 tablet (20 mEq total) by mouth 2 (two) times daily.   ProAir RespiClick [id number] (90 Base) MCG/ACT Aepb Generic drug: Albuterol Sulfate Inhale 1-2 puffs into the lungs every 6 (six) hours as needed (for wheezing/shortness of breath).   albuterol (2.5 MG/3ML) 0.083% nebulizer solution Commonly known as: PROVENTIL Take 3 mLs (2.5 mg total) by nebulization every 6 (six) hours as needed for wheezing or shortness of breath.   Prolia 60 MG/ML Sosy injection Generic drug: denosumab Inject 60 mg into the skin every 6 (six) months.   rosuvastatin 40 MG tablet Commonly known as: CRESTOR TAKE ONE TABLET BY MOUTH EVERYDAY AT BEDTIME   spironolactone 25 MG tablet Commonly known as: ALDACTONE TAKE ONE TABLET BY MOUTH ONCE DAILY   thiamine 100 MG tablet Commonly known as: Vitamin B-1 Take 1 tablet (100 mg total) by mouth every other day.   Trelegy Ellipta 100-62.5-25 MCG/ACT Aepb Generic  drug: Fluticasone-Umeclidin-Vilant INHALE 1 PUFF BY MOUTH INTO LUNGS DAILY   Vitamin D3 125 MCG (5000 UT) capsule Generic drug: Cholecalciferol Take 1 capsule (5,000 Units total) by mouth daily.         OBJECTIVE:   Vital Signs: BP 124/78 (BP Location: Left Arm, Patient Position: Sitting, Cuff Size: Large)   Pulse 82   Ht 5' 4.5" (1.638 m)   Wt 210 lb (95.3 kg)   SpO2 95%   BMI 35.49 kg/m   Wt Readings from Last 3 Encounters:  07/19/22 210 lb (95.3 kg)  06/15/22 208 lb (94.3 kg)  06/03/22 209 lb 6.4 oz (95 kg)     Exam: General: Pt appears well and is in NAD  Neck: General: Supple without adenopathy. Thyroid: Thyroid size normal.  No goiter or nodules appreciated.   Lungs: Clear with good BS bilat   Heart: RRR   Extremities: No pretibial edema.   Neuro: MS is good with appropriate affect, pt is alert and Ox3    DM foot exam: 03/25/2022  The skin of the feet is without sores or ulcerations, multiple callus formation noted b/l The pedal pulses are 2+ on right and 2+ on left. The sensation is intact to a screening 5.07, 10 gram monofilament bilaterally        DATA REVIEWED:  Lab Results  Component Value Date   HGBA1C 7.2 (A) 07/19/2022   HGBA1C 7.1 (A) 03/25/2022   HGBA1C 7.2 (A) 07/22/2021    Latest Reference Range & Units 06/15/22 15:11  Sodium 135 - 145 mEq/L 138  Potassium 3.5 - 5.1 mEq/L 4.1  Chloride 96 - 112 mEq/L 99  CO2 19 - 32 mEq/L 34 (H)  Glucose 70 - 99 mg/dL 144 (H)  BUN 6 - 23 mg/dL 24 (H)  Creatinine 0.40 - 1.20 mg/dL 0.93  Calcium 8.4 - 10.5 mg/dL 9.9  GFR >60.00 mL/min 58.93 (L)  In office BG 141 mg/dL   ASSESSMENT / PLAN / RECOMMENDATIONS:   1) Type 2 Diabetes Mellitus, Optimally controlled, Without complications - Most recent A1c of 7.2 %. Goal A1c < 7.5 %.    -A1c remains at goal -She continues to take Janumet after the meal, specifically bedtime, I have advised the patient again to take it before supper -Her PCP has  prescribed Farxiga 10 mg but this caused dizziness, patient would like to continue to use [place] due to cardiovascular and renal benefits, I have asked her to take half a tablet every morning    MEDICATIONS: Take Janumet 50-1000 mg, 2 tabs before supper Take Farxiga 10 mg, half a tablet every morning  EDUCATION / INSTRUCTIONS: BG monitoring instructions: Patient is instructed to check her blood sugars 1 times a day, fasting. Call [facility] if: BG persistently < 70  I reviewed the Rule of 15 for the treatment of hypoglycemia in detail with the patient. Literature supplied.    2) Diabetic complications:  Eye: Does not have known diabetic retinopathy.  Neuro/ Feet: Does not have known diabetic peripheral neuropathy .  Renal: Patient does not have known baseline CKD. She   is not on an ACEI/ARB at present.   3) MNG:  -She is s/p RAI in 2020 with normalization of thyroid function -No local neck symptoms -She is s/p multiple FNAs over the years, thyroid ultrasound 2020 confirmed 6-year stability and no serial ultrasounds were recommended at the time   F/U in 6 months    I spent 25 minutes preparing to see the patient by review of recent labs, imaging and procedures, obtaining and reviewing separately obtained history, communicating with the patient, ordering medications, tests or procedures, and documenting clinical information in the EHR including the differential Dx, treatment, and any further evaluation and other management   Signed electronically by: Mack Guise, MD  [facility] End[facility]  [facility] [address] [place], [place] [address] Phone: [phone] FAX: [phone]   CC: Janith Lima, [place] [address] Phone: [phone]  Fax: [phone]  Return to Endocrinology clinic as below: Future Appointments  Date Time Provider [place]  08/19/2022  3:00 PM Baird Lyons  D, MD [facility] None  08/19/2022  4:00 PM [facility] RM [facility] None  09/07/2022 11:30 AM Benay Pike, MD [facility] None  09/23/2022 11:10 AM Staysha Truby, Melanie Crazier, MD [facility] None  11/02/2022 10:00 AM GI-315 CT 1 GI-315CT [facility]

## 2022-07-20 ENCOUNTER — Telehealth: Payer: Self-pay

## 2022-07-20 DIAGNOSIS — E119 Type 2 diabetes mellitus without complications: Secondary | ICD-10-CM

## 2022-07-20 NOTE — Telephone Encounter (Signed)
Patient states that she was advised that her iron level is low by her pcp. She would like to know is she needs to be on anything. Patient would like some education on managing her diabetes and lifestyle changes.

## 2022-07-21 NOTE — Telephone Encounter (Signed)
Patient advised will wait for call from dietitian

## 2022-07-22 ENCOUNTER — Other Ambulatory Visit: Payer: Self-pay | Admitting: *Deleted

## 2022-07-22 DIAGNOSIS — I714 Abdominal aortic aneurysm, without rupture, unspecified: Secondary | ICD-10-CM

## 2022-07-26 ENCOUNTER — Ambulatory Visit (HOSPITAL_COMMUNITY)
Admission: RE | Admit: 2022-07-26 | Discharge: 2022-07-26 | Disposition: A | Discharge status: Home / Self Care | Payer: 59 | Source: Ambulatory Visit | Attending: Vascular Surgery | Admitting: Vascular Surgery

## 2022-07-26 DIAGNOSIS — I714 Abdominal aortic aneurysm, without rupture, unspecified: Secondary | ICD-10-CM | POA: Diagnosis not present

## 2022-07-27 ENCOUNTER — Encounter: Payer: Self-pay | Admitting: Vascular Surgery

## 2022-07-27 ENCOUNTER — Ambulatory Visit (INDEPENDENT_AMBULATORY_CARE_PROVIDER_SITE_OTHER): Payer: 59 | Admitting: Vascular Surgery

## 2022-07-27 VITALS — BP 149/74 | HR 76 | Temp 98.3°F | Resp 20 | Ht 64.5 in | Wt 210.0 lb

## 2022-07-27 DIAGNOSIS — I77811 Abdominal aortic ectasia: Secondary | ICD-10-CM | POA: Diagnosis not present

## 2022-07-27 NOTE — Progress Notes (Signed)
[facility] [place]  ASSESSMENT / PLAN: 77 y.o. female with abdominal aortic ectasia.  Counseled patient this is not a true aneurysm.  Her aorta has not grown in over 5 years.  Risk of rupture is less than 1%.  Periodic surveillance with duplex is reasonable.  I will see her again in 3 years with abdominal aortic duplex.  CHIEF COMPLAINT: Follow-up of aortic pathology.  HISTORY OF PRESENT ILLNESS: Victoria [name] is a 77 y.o. female previously followed by Dr. Oneida Alar who returns to clinic for surveillance of abdominal aortic ectasia.  The patient is a chronically ill elderly woman who is on continuous oxygen therapy for COPD.  She is very intellectually sharp and understands more about her comorbidities than most patients.  We spent the bulk of the visit reviewing the natural history of abdominal aortic ectasia.  I counseled her about the extremely low risk of rupture and her very low risk of progression.   Past Medical History:  Diagnosis Date   Abdominal aneurysm ([place])    Dr. Oneida Alar follows lLOV 2 ''17 per pt "around 2 cm"   Anemia    as a child   Arthritis    left ankle, right knee, right SI joint, wrists, lower back   Breast cancer in female [facility])    Left   COPD (chronic obstructive pulmonary disease) ([place])    ephysema-Dr. 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 Harlow Asa in [facility]   Sleep apnea    cpap used sometimes, uses oxygen concentrator 2 l/m nasally bedtime   Varicella as child    Past Surgical History:  Procedure Laterality Date   APPENDECTOMY      2008   BREAST LUMPECTOMY Left 08/31/2017   BREAST LUMPECTOMY WITH RADIOACTIVE SEED LOCALIZATION Left 08/10/2017   Procedure: BREAST LUMPECTOMY WITH RADIOACTIVE SEED LOCALIZATION;  Surgeon: Rolm Bookbinder, MD;  Location: [place];  Service: General;  Laterality: Left;   [place]   COLONOSCOPY     COLONOSCOPY WITH PROPOFOL N/A 04/16/2016   Procedure: COLONOSCOPY WITH PROPOFOL;  Surgeon: Carol Ada, MD;  Location: [facility];  Service: Endoscopy;  Laterality: N/A;   CYST REMOVAL NECK Left 04/13/2016   Procedure: EXCISION OF SEBACEOUS CYST LEFT POSTERIOR NECK;  Surgeon: Armandina Gemma, MD;  Location: [place];  Service: General;  Laterality: Left;   Excision of Pelvic Absess, Right Ovary     2008   RE-EXCISION OF BREAST CANCER,SUPERIOR MARGINS Left 08/31/2017   Procedure: RE-EXCISION OF LEFT  BREAST MARGINS ERAS PATHWAY;  Surgeon: Rolm Bookbinder, MD;  Location: [place];  Service: General;  Laterality: Left;   TUBAL LIGATION  1980    Family History  Problem Relation Age of Onset   Heart disease Mother        MI - fatal   Hypertension Mother    Stroke Father 30       fatal   Alzheimer's disease Father  Alzheimer's disease Brother    Hyperlipidemia Brother    Hypertension Brother    Diabetes Brother    Hypertension Brother    Hyperlipidemia Brother    Breast cancer Paternal Grandmother     Social History   Socioeconomic History   Marital status: Divorced    Spouse name: Not on file   Number of children: 1   Years of education: 16   Highest education level: Not on file  Occupational History   Occupation: Social worker, Technical sales engineer: [employer]  Tobacco Use   Smoking status: Former    Packs/day: 1.00    Years: 50.00    Total pack years: 50.00    Types: Cigarettes    Quit date: 04/16/2011    Years since quitting: 11.2   Smokeless tobacco: Never   Tobacco comments:    quit that date when she had to go to ER   Vaping Use   Vaping Use: Never  used  Substance and Sexual Activity   Alcohol use: Yes    Alcohol/week: 0.0 standard drinks of alcohol    Comment: rare occasion   Drug use: No    Comment: no marijuana since 2012   Sexual activity: Not Currently  Other Topics Concern   Not on file  Social History Narrative   HSG; [name], [place]. . Married '69 - 9 yrs/divorced. 1 son - '72; no grandchildren.   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 No h/o physical or sexual abuse. ACP - no living will - wants information. Provided packet of information. On 08/05/2011: she stated she was distant cousins to celebrities [name] and [name]      Social Determinants of Health   Financial Resource Strain: Low Risk  (06/02/2022)   Overall Financial Resource Strain (CARDIA)    Difficulty of Paying Living Expenses: Not hard at all  Food Insecurity: No Food Insecurity (06/02/2022)   Hunger Vital Sign    Worried About Running Out of Food in the Last Year: Never true    Ran Out of Food in the Last Year: Never true  Transportation Needs: No Transportation Needs (06/02/2022)   PRAPARE - Hydrologist (Medical): No    Lack of Transportation (Non-Medical): No  Physical Activity: Inactive (06/02/2022)   Exercise Vital Sign    Days of Exercise per Week: 0 days    Minutes of Exercise per Session: 0 min  Stress: No Stress Concern Present (06/02/2022)   [place]    Feeling of Stress : Not at all  Social Connections: Moderately Integrated (06/02/2022)   Social Connection and Isolation Panel [NHANES]    Frequency of Communication with Friends and Family: More than three times a week    Frequency of Social Gatherings with Friends and Family: More than three times a week    Attends Religious Services: More than 4 times per year    Active Member of [employer] or Organizations: Yes    Attends English as a second language teacher Meetings: More than 4 times per year    Marital Status: Divorced  Intimate Partner Violence: Not At Risk (06/02/2022)   Humiliation, Afraid, Rape, and Kick questionnaire    Fear of Current or Ex-Partner: No    Emotionally Abused: No    Physically Abused: No    Sexually Abused: No    Allergies  Allergen Reactions   Benicar [Olmesartan] Swelling    Swelling  of face and arms    [name] [Valsartan] Swelling    Swelling of face and arms    Hydrocodone-Acetaminophen Nausea And Vomiting    Severe vomiting   Lisinopril Cough   Monosodium Glutamate Other (See Comments)    Facial swelling per pt   Codeine Other (See Comments)    jittery   Lead Acetate Rash   Nickel Rash    Severe rash to infection: pt is allergic to all metals other than sterling silver or gold jewelry.     Current Outpatient Medications  Medication Sig Dispense Refill   Accu-Chek Softclix Lancets lancets Use as instructed to check blood sugar 3X daily 100 each 1   acetaminophen (TYLENOL) 500 MG tablet Take 1,000 mg by mouth every 6 (six) hours as needed.     albuterol (PROVENTIL) (2.5 MG/3ML) 0.083% nebulizer solution Take 3 mLs (2.5 mg total) by nebulization every 6 (six) hours as needed for wheezing or shortness of breath. 75 mL 3   Albuterol Sulfate (PROAIR RESPICLICK) 123XX123 (90 Base) MCG/ACT AEPB Inhale 1-2 puffs into the lungs every 6 (six) hours as needed (for wheezing/shortness of breath). 1 each 5   amLODipine (NORVASC) 10 MG tablet TAKE ONE TABLET BY MOUTH ONCE DAILY 90 tablet 2   anastrozole (ARIMIDEX) 1 MG tablet Take 1 tablet (1 mg total) by mouth daily. 90 tablet 3   aspirin EC 81 MG tablet Take 1 tablet (81 mg total) by mouth daily. 90 tablet 3   Blood Glucose Monitoring Suppl (ACCU-CHEK AVIVA PLUS) w/Device KIT 1 Device by Does not apply route daily. Use to monitor glucose levels once per day; E11.9 1 kit 0   carvedilol (COREG) 6.25 MG tablet TAKE ONE TABLET BY MOUTH EVERY MORNING and TAKE ONE  TABLET BY MOUTH EVERYDAY AT BEDTIME 180 tablet 2   Cholecalciferol (VITAMIN D3) 125 MCG (5000 UT) CAPS Take 1 capsule (5,000 Units total) by mouth daily. 90 capsule 1   DALIRESP 500 MCG TABS tablet TAKE ONE TABLET BY MOUTH EVERY MORNING 90 tablet 3   dapagliflozin propanediol (FARXIGA) 10 MG TABS tablet Take 5 mg by mouth daily.     denosumab (PROLIA) 60 MG/ML SOSY injection Inject 60 mg into the skin every 6 (six) months. 1 mL 1   Fluticasone-Umeclidin-Vilant (TRELEGY ELLIPTA) 100-62.5-25 MCG/ACT AEPB INHALE 1 PUFF BY MOUTH INTO LUNGS DAILY 60 each 5   glucose blood (ACCU-CHEK AVIVA PLUS) test strip USE TO Check blood glucose THREE TIMES DAILY 300 strip 5   glucose blood (ACCU-CHEK GUIDE) test strip USE 1 STRIP ONCE DAILY 100 each 0   glucose blood test strip USE 1 STRIP ONCE DAILY 100 each 12   OXYGEN Inhale 2-3 L into the lungs continuous. When exerting self     potassium chloride SA (KLOR-CON) 20 MEQ tablet Take 1 tablet (20 mEq total) by mouth 2 (two) times daily. 180 tablet 0   rosuvastatin (CRESTOR) 40 MG tablet TAKE ONE TABLET BY MOUTH EVERYDAY AT BEDTIME 90 tablet 2   SitaGLIPtin-MetFORMIN HCl (JANUMET XR) 50-1000 MG TB24 Take 2 tablets by mouth daily before supper. 180 tablet 3   spironolactone (ALDACTONE) 25 MG tablet TAKE ONE TABLET BY MOUTH ONCE DAILY 90 tablet 2   thiamine (VITAMIN B-1) 100 MG tablet Take 1 tablet (100 mg total) by mouth every other day. 45 tablet 1   No current facility-administered medications for this visit.    PHYSICAL EXAM Vitals:   07/27/22 1315  BP: (!) 149/74  Pulse: 76  Resp: 20  Temp: 98.3 F (36.8 C)  SpO2: 97%  Weight: 210 lb (95.3 kg)  Height: 5' 4.5" (1.638 m)   Elderly woman in no acute distress Regular rate and rhythm On continuous oxygen therapy via nasal cannula.  Unlabored breathing. Benign abdomen  PERTINENT LABORATORY AND RADIOLOGIC DATA  Most recent CBC    Latest Ref Rng & Units 06/15/2022    3:11 PM 11/05/2021   11:23 AM  01/26/2021    4:18 AM  CBC  WBC 4.0 - 10.5 K/uL 9.1  10.1  9.7   Hemoglobin 12.0 - 15.0 g/dL 11.7  11.9  12.3   Hematocrit 36.0 - 46.0 % 35.5  36.6  37.6   Platelets 150.0 - 400.0 K/uL 218.0  237  193      Most recent CMP    Latest Ref Rng & Units 06/15/2022    3:11 PM 11/05/2021   11:23 AM 01/26/2021    4:18 AM  CMP  Glucose 70 - 99 mg/dL 144  122  178   BUN 6 - 23 mg/dL '24  15  7   '$ Creatinine 0.40 - 1.20 mg/dL 0.93  0.84  0.65   Sodium 135 - 145 mEq/L 138  140  137   Potassium 3.5 - 5.1 mEq/L 4.1  3.6  3.7   Chloride 96 - 112 mEq/L 99  103  102   CO2 19 - 32 mEq/L 34  31  27   Calcium 8.4 - 10.5 mg/dL 9.9  10.0  8.9   Total Protein 6.5 - 8.1 g/dL  6.9  6.2   Total Bilirubin 0.3 - 1.2 mg/dL  0.4  0.6   Alkaline Phos 38 - 126 U/L  91  61   AST 15 - 41 U/L  39  13   ALT 0 - 44 U/L  28  8     Renal function CrCl cannot be calculated ([name] most recent lab result is older than the maximum 21 days allowed.).  Hemoglobin A1C (%)  Date Value  07/19/2022 7.2 (A)   Hgb A1c MFr Bld (%)  Date Value  01/25/2021 6.3 (H)    LDL Cholesterol (Calc)  Date Value Ref Range Status  12/24/2019 92 mg/dL (calc) Final    Comment:    Reference range: <100 . Desirable range <100 mg/dL for primary prevention;   <70 mg/dL for patients with CHD or diabetic patients  with > or = 2 CHD risk factors. [name] LDL-C is now calculated using the Martin-Hopkins  calculation, which is a validated novel method providing  better accuracy than the Friedewald equation in the  estimation of LDL-C.  Cresenciano Genre et al. Annamaria Helling. [id number]): 2061-2068  (http://education.[facility].com/faq/FAQ164)    LDL Chol Calc (NIH)  Date Value Ref Range Status  11/10/2021 40 0 - 99 mg/dL Final   Direct LDL  Date Value Ref Range Status  09/05/2014 121.0 mg/dL Final    Comment:    Optimal:  <100 mg/dLNear or Above Optimal:  100-129 mg/dLBorderline High:  130-159 mg/dLHigh:  160-189 mg/dLVery High:  >190 mg/dL     Abdominal Aorta: No evidence of an abdominal aortic aneurysm was  visualized. Maximum aorta diameter= 2.41cm.      Victoria [name]. Stanford Breed, MD [facility] Phone Number: [phone] 07/27/2022 2:49 PM   Total time spent on preparing this encounter including chart review, data review, collecting history, examining the patient, coordinating care for this new patient, 45 minutes.  Portions of this report may have been transcribed using voice recognition software.  Every effort has been made to ensure accuracy; however, inadvertent computerized transcription errors may still be present.

## 2022-08-03 ENCOUNTER — Other Ambulatory Visit (HOSPITAL_COMMUNITY): Payer: Self-pay

## 2022-08-11 ENCOUNTER — Ambulatory Visit: Payer: 59 | Admitting: Nutrition

## 2022-08-12 DIAGNOSIS — G4733 Obstructive sleep apnea (adult) (pediatric): Secondary | ICD-10-CM | POA: Diagnosis not present

## 2022-08-12 DIAGNOSIS — J449 Chronic obstructive pulmonary disease, unspecified: Secondary | ICD-10-CM | POA: Diagnosis not present

## 2022-08-12 DIAGNOSIS — I10 Essential (primary) hypertension: Secondary | ICD-10-CM | POA: Diagnosis not present

## 2022-08-16 NOTE — Progress Notes (Signed)
08/19/22- 29 yoF former smoker (50 pkyrs)  seeking to re-establish for sleep with hx OSA Followed by Dr Chase Caller and Royal Piedra, NP for severe COPD, Chronic Respiratory Failure with hypoxia,  Medical problem list includes HTN, AAA, ASCVD, lung nodules, DM2, Hyperthyroid, CKD3a, Hyperlipidemia, hxL Breast CA, Seizure,  O2 2-3L NPSG 01/12/15- AHI 34.5/ hr, desaturation to 79%, body weight 223 lbs CPAP 13/ [facility] Epworth score- Body weight today-

## 2022-08-17 ENCOUNTER — Ambulatory Visit: Payer: 59 | Admitting: Nutrition

## 2022-08-19 ENCOUNTER — Telehealth (INDEPENDENT_AMBULATORY_CARE_PROVIDER_SITE_OTHER): Payer: 59 | Admitting: Nurse Practitioner

## 2022-08-19 ENCOUNTER — Encounter: Payer: 59 | Admitting: Internal Medicine

## 2022-08-19 VITALS — BP 157/62 | Ht 64.5 in

## 2022-08-19 DIAGNOSIS — T148XXA Other injury of unspecified body region, initial encounter: Secondary | ICD-10-CM | POA: Diagnosis not present

## 2022-08-19 MED ORDER — METHOCARBAMOL 500 MG PO TABS: 500.0000 mg | ORAL_TABLET | Freq: Three times a day (TID) | ORAL | 0 refills | Status: DC | PRN

## 2022-08-19 NOTE — Progress Notes (Signed)
Established Patient Office Visit  An audio-only tele-health visit was completed today for this patient. I connected with  Victoria [name] on 08/19/22 utilizing audio-only technology and verified that I am speaking with the correct person using two identifiers. The patient was located at their home, and I was located at the office of [place] [facility] during the encounter. I discussed the limitations of evaluation and management by telemedicine. The patient expressed understanding and agreed to proceed.   **Audio and visual technology failed to work during visit.  Thus, audio only technology was used.  Subjective   Patient ID: Victoria [name], female    DOB: 14-Feb-1946  Age: 77 y.o. MRN: [id number]  Chief Complaint  Patient presents with   Back Pain    [place]e back pain on the right side  About the last 3 days, pain has been going, down after drinking a lot of water  Took equate tylenol which has helped.    Low back pain: symptom onset 5 days ago. Prior to pain onset she was more active in her home than normal with cleaning, helping to move light furniture, and grocery shopping in quick succession. She has COPD and is oxygen dependent at baseline which requires her to take frequent breaks so she is somewhat sedentary at baseline. Has been using tylenol as needed, pain is 6-7/10 described as aching with some intermittent sharp pain with certain movements. Has been using a back belt support which has helped pain as well. No dysuria, fever.     Review of Systems  Constitutional:  Negative for chills and fever.  Genitourinary:  Positive for flank pain. Negative for dysuria and hematuria.  Musculoskeletal:  Positive for back pain.  Neurological:  Negative for tingling and weakness.      Objective:     BP (!) 157/62   Ht 5' 4.5" (1.638 m)   BMI 35.49 kg/m  BP Readings from Last 3 Encounters:  08/19/22 (!) 157/62  07/27/22 (!) 149/74  07/19/22 124/78   Wt  Readings from Last 3 Encounters:  07/27/22 210 lb (95.3 kg)  07/19/22 210 lb (95.3 kg)  06/15/22 208 lb (94.3 kg)      Physical Exam Comprehensive physical exam not completed today as office visit was conducted remotely.  Patient sounded well over the phone.  Patient was alert and oriented, and appeared to have appropriate judgment.   No results found for any visits on 08/19/22.    The ASCVD Risk score (Arnett DK, et al., 2019) failed to calculate for the following reasons:   The valid total cholesterol range is 130 to 320 mg/dL    Assessment & Plan:   Problem List Items Addressed This Visit       Musculoskeletal and Integument   Muscle strain - Primary    [name] description of pain seems consistent with muscle strain.  Treat with Tylenol 500 mg every 6 hours while awake.  Will also prescribe methocarbamol 500 mg 3 times daily as needed muscle spasm.  Patient was educated on risk of falls and somnolence.  Patient has family that checks on her daily.  She was told not to drive or operate heavy machinery while taking methocarbamol.  Patient told to follow-up in person for face-to-face evaluation if symptoms persist next week.      Relevant Medications   methocarbamol (ROBAXIN) 500 MG tablet    Return if symptoms worsen or fail to improve.  Total time spent telephone was 26  minutes and 49 seconds.   Ailene Ards, NP

## 2022-08-19 NOTE — Assessment & Plan Note (Signed)
[name] description of pain seems consistent with muscle strain.  Treat with Tylenol 500 mg every 6 hours while awake.  Will also prescribe methocarbamol 500 mg 3 times daily as needed muscle spasm.  Patient was educated on risk of falls and somnolence.  Patient has family that checks on her daily.  She was told not to drive or operate heavy machinery while taking methocarbamol.  Patient told to follow-up in person for face-to-face evaluation if symptoms persist next week.

## 2022-08-23 ENCOUNTER — Encounter: Payer: 59 | Attending: Internal Medicine | Admitting: Nutrition

## 2022-08-23 VITALS — Wt 210.2 lb

## 2022-08-23 DIAGNOSIS — E118 Type 2 diabetes mellitus with unspecified complications: Secondary | ICD-10-CM | POA: Insufficient documentation

## 2022-09-06 ENCOUNTER — Telehealth: Payer: Self-pay | Admitting: Internal Medicine

## 2022-09-06 NOTE — Telephone Encounter (Signed)
Representative on behalf of [employer] called about patient's prolia injection from July of 2023. They would like a callback at [phone]. The patient reference number is [id number].

## 2022-09-06 NOTE — Progress Notes (Signed)
Patient is here today to "learn about what to eat" SBGM:  meter-testing only once a day-fasting.   Did not bring meter,  Says FBS today was 110 Medications: Janumet 50-1000 mg, 2 tabs before supper                       Farxiga 10 mg, half a tablet every morning Exercise:  none, in wheelchair, and on O2.  Says walk around house as tolerated, and fixes own meals. Diet:  patient wants to learn what to eat and to help her loose some weight. Typical day:  7AM: awake 9AM:  Bfast:  cottage cheese and fruit, and applesauce, and peanut butter sandwich.  Drinks 1/2c orange juice                      Or: smootie with juice an fruit, and peanut butter sandwich with water to drink 1PM: Baked chicken with 1C yellow grits with butter.  Juice or Gingerale to drink  or          Sandwhich with lunch meat-turkey or ham, sometimes cheese with mayo and lettuce   Also apple with peanut butter  or          [employer] fish sandwich with small fries, or large salad with chicken and cheese and dressing.  Fruit tea to drink  6-8PM: supper: Chicken-leg and thigh, or 4 ounces [place], or fried fish or ground [place] hamburger, with  broccoli, spinach, or green beans, bread or potato  10PM: snack of PB crackers or PB on apple slices with gingerale or water Discussion:  Need to test blood sugar once a day at 2 hours after meals to see if blood sugar is coming down after eating.  Goal: less than 160.  Suggested CGM, but refused Discussed need for 3 basic food groups and what foods are in each group, with need to limit carbs and fat. Need to stop all fruit juices, sweet drinks and cold cereal and milk Need for exercise.  Gave chair exercises to do, with helping to strengthen muscles. To start with 5 minutes if tolerated,and work to 20 minutes once or twice a day.   Need to limit fried foods to once a week to help with weight loss. Suggestions given for [facility] food choices for lunch that are less than 500 calories

## 2022-09-06 NOTE — Telephone Encounter (Signed)
Spoke with Rep. Just Wanting to confirming where the Prolia came from and insurance.

## 2022-09-06 NOTE — Patient Instructions (Signed)
Test blood sugar before breakfast and 2 hours after one meal each day. Call if 2 hour reading is over 180. Stop all sweet drinks and fruit juices. Limit fried fish sandwich to once a week and remove cheese from sandwiches to help with weight loss

## 2022-09-07 ENCOUNTER — Telehealth: Payer: Self-pay | Admitting: Hematology and Oncology

## 2022-09-07 ENCOUNTER — Inpatient Hospital Stay: Payer: 59 | Admitting: Hematology and Oncology

## 2022-09-12 DIAGNOSIS — I10 Essential (primary) hypertension: Secondary | ICD-10-CM | POA: Diagnosis not present

## 2022-09-12 DIAGNOSIS — G4733 Obstructive sleep apnea (adult) (pediatric): Secondary | ICD-10-CM | POA: Diagnosis not present

## 2022-09-12 DIAGNOSIS — J449 Chronic obstructive pulmonary disease, unspecified: Secondary | ICD-10-CM | POA: Diagnosis not present

## 2022-09-16 ENCOUNTER — Inpatient Hospital Stay: Payer: 59 | Attending: Hematology and Oncology | Admitting: Hematology and Oncology

## 2022-09-16 ENCOUNTER — Other Ambulatory Visit: Payer: Self-pay

## 2022-09-16 VITALS — BP 149/62 | HR 76 | Temp 97.5°F | Resp 16 | Wt 200.2 lb

## 2022-09-16 DIAGNOSIS — Z87891 Personal history of nicotine dependence: Secondary | ICD-10-CM | POA: Insufficient documentation

## 2022-09-16 DIAGNOSIS — Z170 Estrogen receptor positive status [ER+]: Secondary | ICD-10-CM

## 2022-09-16 DIAGNOSIS — C50412 Malignant neoplasm of upper-outer quadrant of left female breast: Secondary | ICD-10-CM | POA: Diagnosis not present

## 2022-09-16 DIAGNOSIS — Z79811 Long term (current) use of aromatase inhibitors: Secondary | ICD-10-CM | POA: Diagnosis not present

## 2022-09-16 NOTE — Progress Notes (Addendum)
[facility]  Telephone:([phone]) [phone] Fax:([phone]     ID: Baldwin Jamaica DOB: 1945-12-02  MR#: [id number]  JYN#:[id number]  Patient Care Team: Etta Grandchild, MD as PCP - General (Internal Medicine) Lennette Bihari, MD as PCP - Cardiology (Cardiology) Kalman Shan, MD as Consulting Physician (Pulmonary Disease) Richardean Chimera, MD as Consulting Physician (Obstetrics and Gynecology) Magrinat, Valentino Hue, MD (Inactive) as Consulting Physician (Oncology) Emelia Loron, MD as Consulting Physician (General Surgery) Antony Blackbird, MD as Consulting Physician (Radiation Oncology) Lennette Bihari, MD as Consulting Physician (Cardiology) Axel Filler, Larna Daughters, NP as Nurse Practitioner (Hematology and Oncology) Parrett, Virgel Bouquet, NP as Nurse Practitioner (Pulmonary Disease) Romero Belling, MD (Inactive) as Consulting Physician (Endocrinology) Ellin Saba, [facility] (Inactive) (Pharmacist) OTHER MD:  CHIEF COMPLAINT: Estrogen receptor positive breast cancer   CURRENT TREATMENT: Anastrozole   INTERVAL HISTORY:  Ms. Mathies is here for a follow-up.  She is doing really well, taking anastrozole as prescribed.  She denies any new health complaints today.  Ms. Majcher is very excited that she is losing some weight, she has done some modifications in her diet, met with a nutritionist and is working very hard to maintain or continue losing weight.  Besides the weight loss which is intentional, she feels well.  She denies any changes in her breast.  Last mammogram in July 2023 with no evidence of malignancy.  Rest of the pertinent 10 point ROS reviewed and negative  Wt Readings from Last 3 Encounters:  09/16/22 200 lb 3.2 oz (90.8 kg)  08/23/22 210 lb 3.2 oz (95.3 kg)  07/27/22 210 lb (95.3 kg)     Rest of the pertinent 10 point ROS reviewed and negative.   COVID 19 VACCINATION STATUS: Pfizer x2, most recently 06/2019, and status post 1 booster    HISTORY OF CURRENT  ILLNESS: From the original intake note:  Julianie Sinnett had routine screening mammography on 06/16/2017 showing a possible abnormality in the left breast. She underwent unilateral left diagnostic mammography with tomography and left breast ultrasonography at [facility] on 06/22/2017 showing: breast density category B. Suspicious left breast mass in the 2:30 position upper outer quadrant measuring 0.6 x 0.7 x 0.6 cm located 2 cm from the nipple. Palpable thickening with distortion. There was no sonographic evidence of lest axillary lymphadenopathy.  Accordingly on 06/24/2017 she proceeded to biopsy of the left breast mass in question. The pathology from this procedure showed ([id number]):  Invasive mammary carcinoma, grade 1. E-cadherin is positive supporting Ductal carcinoma diagnosis. Prognostic indicators significant for: estrogen receptor, 100% positive and progesterone receptor, 100% positive, both with strong staining intensity. Proliferation marker Ki67 at 5%. HER2 not amplified with ratios of HER2/CEP17 signals 1.21 and average copies per cell 2.00.  The patient's subsequent history is as detailed below.   PAST MEDICAL HISTORY: Past Medical History:  Diagnosis Date   Abdominal aneurysm (HCC)    Dr. Darrick Penna follows lLOV 2 ''17 per pt "around 2 cm"   Anemia    as a child   Arthritis    left ankle, right knee, right SI joint, wrists, lower back   Breast cancer in female [facility])    Left   COPD (chronic obstructive pulmonary disease) (HCC)    ephysema-Dr. Marchelle Gearing   Dyspnea    Headache    as a child would have terrible headaches during season changes   Heart murmur    congenital, 2 D echo '10   Hyperlipidemia  Hypertension    Multiple thyroid nodules    Murmur, cardiac 1950   Osteoporosis    Pneumonia    Pre-diabetes    Requires continuous at home supplemental oxygen    2 L 24/7   Sebaceous cyst    hairline sebaceous cyst left posterior neck to be excised 04-13-16  by Dr. Gerrit Friends in OR [place]   Sleep apnea    cpap used sometimes, uses oxygen concentrator 2 l/m nasally bedtime   Varicella as child    PAST SURGICAL HISTORY: Past Surgical History:  Procedure Laterality Date   APPENDECTOMY     2008   BREAST LUMPECTOMY Left 08/31/2017   BREAST LUMPECTOMY WITH RADIOACTIVE SEED LOCALIZATION Left 08/10/2017   Procedure: BREAST LUMPECTOMY WITH RADIOACTIVE SEED LOCALIZATION;  Surgeon: Emelia Loron, MD;  Location: [facility] OR;  Service: General;  Laterality: Left;   CESAREAN SECTION     1972   COLONOSCOPY     COLONOSCOPY WITH PROPOFOL N/A 04/16/2016   Procedure: COLONOSCOPY WITH PROPOFOL;  Surgeon: Jeani Hawking, MD;  Location: [facility];  Service: Endoscopy;  Laterality: N/A;   CYST REMOVAL NECK Left 04/13/2016   Procedure: EXCISION OF SEBACEOUS CYST LEFT POSTERIOR NECK;  Surgeon: Darnell Level, MD;  Location: [facility] OR;  Service: General;  Laterality: Left;   Excision of Pelvic Absess, Right Ovary     2008   RE-EXCISION OF BREAST CANCER,SUPERIOR MARGINS Left 08/31/2017   Procedure: RE-EXCISION OF LEFT  BREAST MARGINS ERAS PATHWAY;  Surgeon: Emelia Loron, MD;  Location: [facility] OR;  Service: General;  Laterality: Left;   TUBAL LIGATION  1980    FAMILY HISTORY Family History  Problem Relation Age of Onset   Heart disease Mother        MI - fatal   Hypertension Mother    Stroke Father 64       fatal   Alzheimer's disease Father    Alzheimer's disease Brother    Hyperlipidemia Brother    Hypertension Brother    Diabetes Brother    Hypertension Brother    Hyperlipidemia Brother    Breast cancer Paternal Grandmother   The patient's father died at age 51 due to a stroke and prostate cancer.  The patient's mother died at age 68 due to a heart attack and blood clot. The patient has 3 brothers and no sisters. She notes a paternal grandmother who was diagnosed with breast cancer at age 36. She notes a paternal cousin with lung cancer. She denies a  family history of ovarian cancer.   GYNECOLOGIC HISTORY:  No LMP recorded. Patient is postmenopausal. Menarche: 77 years old Age at first live birth: 77 years old GX P1 LMP: age 2 The patient had a right ovarian cyst which required removal. She notes that she still has her left ovary. She was on HRT for a short amount of time, but discontinued due to negative side affects. She was placed on Estroven as well.    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 She helps individuals with [employer] and program assisting on the computer. She is divorced. The patient's son, Madolin Stallard, is disabled and works part-time at [employer] in [place]. The patient has 3 grandchildren and 3 great-grandchildren. She attends [employer].      ADVANCED DIRECTIVES:  Son, Earl Lites and daughter in law, Lupita Leash   HEALTH MAINTENANCE: Social History   Tobacco Use  Smoking status: Former    Packs/day: 1.00    Years: 50.00    Additional pack years: 0.00    Total pack years: 50.00    Types: Cigarettes    Quit date: 04/16/2011    Years since quitting: 11.4   Smokeless tobacco: Never   Tobacco comments:    quit that date when she had to go to ER   Vaping Use   Vaping Use: Never used  Substance Use Topics   Alcohol use: Yes    Alcohol/week: 0.0 standard drinks of alcohol    Comment: rare occasion   Drug use: No    Comment: no marijuana since 2012     Colonoscopy: December 2017/ Dr. Elnoria Howard  PAP: September 2016  Bone density: 12/08/2016 showed a T score of -2.4   Allergies  Allergen Reactions   Benicar [Olmesartan] Swelling    Swelling of face and arms    [name] [Valsartan] Swelling    Swelling of face and arms    Hydrocodone-Acetaminophen Nausea And Vomiting    Severe vomiting   Lisinopril Cough   Monosodium Glutamate Other (See Comments)    Facial swelling per pt    Codeine Other (See Comments)    jittery   Lead Acetate Rash   Nickel Rash    Severe rash to infection: pt is allergic to all metals other than sterling silver or gold jewelry.     Current Outpatient Medications  Medication Sig Dispense Refill   Accu-Chek Softclix Lancets lancets Use as instructed to check blood sugar 3X daily 100 each 1   acetaminophen (TYLENOL) 500 MG tablet Take 1,000 mg by mouth every 6 (six) hours as needed.     albuterol (PROVENTIL) (2.5 MG/3ML) 0.083% nebulizer solution Take 3 mLs (2.5 mg total) by nebulization every 6 (six) hours as needed for wheezing or shortness of breath. 75 mL 3   Albuterol Sulfate (PROAIR RESPICLICK) 108 (90 Base) MCG/ACT AEPB Inhale 1-2 puffs into the lungs every 6 (six) hours as needed (for wheezing/shortness of breath). 1 each 5   amLODipine (NORVASC) 10 MG tablet TAKE ONE TABLET BY MOUTH ONCE DAILY 90 tablet 2   anastrozole (ARIMIDEX) 1 MG tablet Take 1 tablet (1 mg total) by mouth daily. 90 tablet 3   aspirin EC 81 MG tablet Take 1 tablet (81 mg total) by mouth daily. 90 tablet 3   Blood Glucose Monitoring Suppl (ACCU-CHEK AVIVA PLUS) w/Device KIT 1 Device by Does not apply route daily. Use to monitor glucose levels once per day; E11.9 1 kit 0   carvedilol (COREG) 6.25 MG tablet TAKE ONE TABLET BY MOUTH EVERY MORNING and TAKE ONE TABLET BY MOUTH EVERYDAY AT BEDTIME 180 tablet 2   Cholecalciferol (VITAMIN D3) 125 MCG (5000 UT) CAPS Take 1 capsule (5,000 Units total) by mouth daily. 90 capsule 1   DALIRESP 500 MCG TABS tablet TAKE ONE TABLET BY MOUTH EVERY MORNING 90 tablet 3   dapagliflozin propanediol (FARXIGA) 10 MG TABS tablet Take 5 mg by mouth daily.     denosumab (PROLIA) 60 MG/ML SOSY injection Inject 60 mg into the skin every 6 (six) months. 1 mL 1   Fluticasone-Umeclidin-Vilant (TRELEGY ELLIPTA) 100-62.5-25 MCG/ACT AEPB INHALE 1 PUFF BY MOUTH INTO LUNGS DAILY 60 each 5   glucose blood (ACCU-CHEK AVIVA PLUS) test strip USE TO Check  blood glucose THREE TIMES DAILY 300 strip 5   glucose blood (ACCU-CHEK GUIDE) test strip USE 1 STRIP ONCE DAILY 100 each 0   glucose  blood test strip USE 1 STRIP ONCE DAILY 100 each 12   methocarbamol (ROBAXIN) 500 MG tablet Take 1 tablet (500 mg total) by mouth every 8 (eight) hours as needed for muscle spasms. 30 tablet 0   OXYGEN Inhale 2-3 L into the lungs continuous. When exerting self     potassium chloride SA (KLOR-CON) 20 MEQ tablet Take 1 tablet (20 mEq total) by mouth 2 (two) times daily. 180 tablet 0   rosuvastatin (CRESTOR) 40 MG tablet TAKE ONE TABLET BY MOUTH EVERYDAY AT BEDTIME 90 tablet 2   SitaGLIPtin-MetFORMIN HCl (JANUMET XR) 50-1000 MG TB24 Take 2 tablets by mouth daily before supper. 180 tablet 3   spironolactone (ALDACTONE) 25 MG tablet TAKE ONE TABLET BY MOUTH ONCE DAILY 90 tablet 2   thiamine (VITAMIN B-1) 100 MG tablet Take 1 tablet (100 mg total) by mouth every other day. 45 tablet 1   No current facility-administered medications for this visit.    OBJECTIVE: African-American woman examined in a wheelchair  Vitals:   09/16/22 1124  BP: (!) 149/62  Pulse: 76  Resp: 16  Temp: (!) 97.5 F (36.4 C)  SpO2: 93%     Body mass index is 33.83 kg/m.   Wt Readings from Last 3 Encounters:  09/16/22 200 lb 3.2 oz (90.8 kg)  08/23/22 210 lb 3.2 oz (95.3 kg)  07/27/22 210 lb (95.3 kg)  ECOG FS:2 - Symptomatic, <50% confined to bed  Physical Exam Constitutional:      Appearance: Normal appearance.  Cardiovascular:     Rate and Rhythm: Normal rate and regular rhythm.  Chest:     Comments: Bilateral breasts inspected in sitting position.  No palpable masses or regional adenopathy.  Musculoskeletal:        General: No swelling or tenderness.     Cervical back: Normal range of motion.  Skin:    General: Skin is warm and dry.  Neurological:     General: No focal deficit present.     Mental Status: She is alert.       LAB RESULTS:  CMP     Component Value  Date/Time   NA 138 06/15/2022 1511   NA 139 01/08/2019 1025   K 4.1 06/15/2022 1511   CL 99 06/15/2022 1511   CO2 34 (H) 06/15/2022 1511   GLUCOSE 144 (H) 06/15/2022 1511   BUN 24 (H) 06/15/2022 1511   BUN 15 01/08/2019 1025   CREATININE 0.93 06/15/2022 1511   CREATININE 0.84 11/05/2021 1123   CREATININE 0.74 12/24/2019 1420   CALCIUM 9.9 06/15/2022 1511   PROT 6.9 11/05/2021 1123   PROT 6.6 01/08/2019 1025   ALBUMIN 3.8 11/05/2021 1123   ALBUMIN 4.1 01/08/2019 1025   AST 39 11/05/2021 1123   ALT 28 11/05/2021 1123   ALKPHOS 91 11/05/2021 1123   BILITOT 0.4 11/05/2021 1123   GFRNONAA >60 11/05/2021 1123   GFRNONAA 79 12/24/2019 1420   GFRAA 92 12/24/2019 1420    Lab Results  Component Value Date   TOTALPROTELP 6.7 02/28/2014   ALBUMINELP 57.4 02/28/2014   A1GS 4.9 02/28/2014   A2GS 8.5 02/28/2014   BETS 7.5 (H) 02/28/2014   BETA2SER 5.3 02/28/2014   GAMS 16.4 02/28/2014   MSPIKE 0.31 02/28/2014   SPEI SEE NOTE 02/28/2014    No results found for: "KPAFRELGTCHN", "LAMBDASER", "KAPLAMBRATIO"  Lab Results  Component Value Date   WBC 9.1 06/15/2022   NEUTROABS 6.4 06/15/2022   HGB 11.7 (L) 06/15/2022   HCT  35.5 (L) 06/15/2022   MCV 85.6 06/15/2022   PLT 218.0 06/15/2022   No results found for: "LABCA2"  No components found for: "UJWJXB147"  No results for input(s): "INR" in the last 168 hours.  No results found for: "LABCA2"  No results found for: "WGN562"  No results found for: "CAN125"  No results found for: "CAN153"  No results found for: "CA2729"  No components found for: "HGQUANT"  No results found for: "CEA1", "CEA" / No results found for: "CEA1", "CEA"   No results found for: "AFPTUMOR"  No results found for: "CHROMOGRNA"  No results found for: "HGBA", "HGBA2QUANT", "HGBFQUANT", "HGBSQUAN" (Hemoglobinopathy evaluation)   No results found for: "LDH"  Lab Results  Component Value Date   IRON 59 04/19/2019   IRONPCTSAT 16.4 (L)  04/19/2019   (Iron and TIBC)  Lab Results  Component Value Date   FERRITIN 84.2 04/19/2019    Urinalysis    Component Value Date/Time   COLORURINE YELLOW 06/15/2022 1511   APPEARANCEUR Cloudy (A) 06/15/2022 1511   LABSPEC 1.025 06/15/2022 1511   PHURINE 6.0 06/15/2022 1511   GLUCOSEU NEGATIVE 06/15/2022 1511   HGBUR TRACE-INTACT (A) 06/15/2022 1511   BILIRUBINUR NEGATIVE 06/15/2022 1511   KETONESUR NEGATIVE 06/15/2022 1511   PROTEINUR NEGATIVE 01/25/2021 1228   UROBILINOGEN 0.2 06/15/2022 1511   NITRITE NEGATIVE 06/15/2022 1511   LEUKOCYTESUR NEGATIVE 06/15/2022 1511    STUDIES: No results found.   ELIGIBLE FOR AVAILABLE RESEARCH PROTOCOL: no  ASSESSMENT: 77 y.o. [place] woman status post left breast upper outer quadrant biopsy 06/24/2017 for a clinical T1b N0, stage IA invasive ductal carcinoma, grade 1, estrogen and progesterone receptor positive, HER-2 not amplified, with an MIB-105%  (1) status post left lumpectomy without sentinel lymph node sampling on 08/10/2017 showed invasive ductal carcinoma grade II, spanning 1.2 cm. High grade DCIS. Anterior margin was was focally positive for invasive ductal carcinoma. All other margins clear.   (a) additional surgery for margin clearance 08/31/2017 showed atypical ductal hyperplasia, negative margins  (2) given the patient's age, the tumor size, grade and prognostic profile, will forego Oncotype and avoid chemotherapy  (3) may forgo adjuvant radiation if comfortable with anti-estrogen therapy  (4) anastrozole started 08/30/2017  (5) osteoporosis: bone density on 02/27/2014 showed a T score of -2.9   (a) bone density on 12/08/2016 showed a T score of -2.4 (osteopenia).  (b) bone density 04/10/2019 shows a T score of -2.2  (6) lung cancer screening: Initiated June 2019  (a) most recent scan 07/15/2020 benign  (b)  incidentally noted adrenal adenomas stable   PLAN:  She is tolerating anastrozole well and the plan is  to continue that a total of 5 years.  She was encouraged to continue her current prescription and discontinue the medication when she runs out of it.  She is very thrilled by this.  She is also very motivated to keep the diet modified with low carbs and she is able to lose about 10 pounds since her last visit.  She has met with a nutritionist who suggested some changes to her diet and she is following it. She had a CT chest lung cancer screening which showed annual screening with low-dose chest CT without contrast in 12 months. Last mammogram in July 2023 BI-RADS Category 2 benign Encouraged regular walking, calcium and vitamin D supplementation for bone density.  She may also be due for a bone density.  She was encouraged to do this with her PCP.  Total encounter time  30 minutes.*  *Total Encounter Time as defined by the Centers for Medicare and Medicaid Services includes, in addition to the face-to-face time of a patient visit (documented in the note above) non-face-to-face time: obtaining and reviewing outside history, ordering and reviewing medications, tests or procedures, care coordination (communications with other health care professionals or caregivers) and documentation in the medical record.

## 2022-09-23 ENCOUNTER — Ambulatory Visit: Payer: Medicare Other | Admitting: Internal Medicine

## 2022-09-24 ENCOUNTER — Inpatient Hospital Stay: Admission: RE | Admit: 2022-09-24 | Payer: 59 | Source: Ambulatory Visit

## 2022-09-29 NOTE — Progress Notes (Signed)
09/30/22- 71 yoF former smoker (50 pkyrs)  seeking to re-establish for sleep with hx OSA Followed by Dr Marchelle Gearing and Clent Ridges, NP for severe COPD, Chronic Respiratory Failure with hypoxia,  Medical problem list includes HTN, AAA, ASCVD, lung nodules, DM2, Hyperthyroid, CKD3a, Hyperlipidemia, hxL Breast CA, Seizure,  O2 2-3L NPSG 01/12/15- AHI 34.5/ hr, desaturation to 79%, body weight 223 lbs CPAP 13/ [facility] Epworth score- Body weight today-202 lbs -----Needs new provider for her possible sleep apnea.  Sees MR here. She describes trying for 2 years but never able to tolerate CPAP.  Wears oxygen alone-2 L for sleep, 3 L through her POC.  She is meeting with [facility] about candidacy for Zephyr valves for her COPD.  She wants to wait on an updated sleep study until after that.  Then, based on results of sleep study, she would consider possibly oral appliance or Inspire.  ROS-see HPI   + = positive Constitutional:    weight loss, night sweats, fevers, chills, fatigue, lassitude. HEENT:    headaches, difficulty swallowing, tooth/dental problems, sore throat,       sneezing, itching, ear ache, nasal congestion, post nasal drip, snoring CV:    chest pain, orthopnea, PND, swelling in lower extremities, anasarca,                                  dizziness, palpitations Resp:   shortness of breath with exertion or at rest.                productive cough,   non-productive cough, coughing up of blood.              change in color of mucus.  wheezing.   Skin:    rash or lesions. GI:  No-   heartburn, indigestion, abdominal pain, nausea, vomiting, diarrhea,                 change in bowel habits, loss of appetite GU: dysuria, change in color of urine, no urgency or frequency.   flank pain. MS:   joint pain, stiffness, decreased range of motion, back pain. Neuro-     nothing unusual Psych:  change in mood or affect.  depression or anxiety.   memory loss.  OBJ- Physical Exam General- Alert, Oriented,  Affect-appropriate, Distress- none acute, + wheelchair, + morbidly obese Skin- rash-none, lesions- none, excoriation- none Lymphadenopathy- none Head- atraumatic            Eyes- Gross vision intact, PERRLA, conjunctivae and secretions clear            Ears- Hearing, canals-normal            Nose- Clear, no-Septal dev, mucus, polyps, erosion, perforation             Throat- Mallampati III , mucosa clear , drainage- none, tonsils- atrophic, +teeth Neck- flexible , trachea midline, no stridor , thyroid nl, carotid no bruit Chest - symmetrical excursion , unlabored           Heart/CV- RRR , murmur +trace, no gallop  , no rub, nl s1 s2                           - JVD- none , edema- none, stasis changes- none, varices- none           Lung- clear to P&A/ distant, wheeze- none, cough- none ,  dullness-none, rub- none           Chest wall-  Abd-  Br/ Gen/ Rectal- Not done, not indicated Extrem- cyanosis- none, clubbing, none, atrophy- none, strength- nl Neuro- grossly intact to observation

## 2022-09-30 ENCOUNTER — Encounter: Payer: Self-pay | Admitting: Internal Medicine

## 2022-09-30 ENCOUNTER — Ambulatory Visit (INDEPENDENT_AMBULATORY_CARE_PROVIDER_SITE_OTHER): Payer: 59 | Admitting: Internal Medicine

## 2022-09-30 ENCOUNTER — Other Ambulatory Visit: Payer: Self-pay

## 2022-09-30 VITALS — BP 142/72 | HR 87 | Temp 99.0°F | Ht 64.5 in | Wt 202.0 lb

## 2022-09-30 DIAGNOSIS — G4733 Obstructive sleep apnea (adult) (pediatric): Secondary | ICD-10-CM

## 2022-09-30 DIAGNOSIS — J9611 Chronic respiratory failure with hypoxia: Secondary | ICD-10-CM

## 2022-09-30 DIAGNOSIS — J449 Chronic obstructive pulmonary disease, unspecified: Secondary | ICD-10-CM | POA: Diagnosis not present

## 2022-09-30 LAB — PULMONARY FUNCTION TEST
DL/VA % pred: 67 %
DL/VA: 2.76 ml/min/mmHg/L
DLCO cor % pred: 48 %
DLCO cor: 9.41 ml/min/mmHg
DLCO unc % pred: 48 %
DLCO unc: 9.41 ml/min/mmHg
FEF 25-75 Post: 0.23 L/sec
FEF 25-75 Pre: 0.28 L/sec
FEF2575-%Change-Post: -17 %
FEF2575-%Pred-Post: 14 %
FEF2575-%Pred-Pre: 17 %
FEV1-%Change-Post: -7 %
FEV1-%Pred-Post: 32 %
FEV1-%Pred-Pre: 35 %
FEV1-Post: 0.67 L
FEV1-Pre: 0.73 L
FEV1FVC-%Change-Post: 0 %
FEV1FVC-%Pred-Pre: 61 %
FEV6-%Change-Post: -6 %
FEV6-%Pred-Post: 52 %
FEV6-%Pred-Pre: 56 %
FEV6-Post: 1.38 L
FEV6-Pre: 1.48 L
FEV6FVC-%Change-Post: 1 %
FEV6FVC-%Pred-Post: 100 %
FEV6FVC-%Pred-Pre: 98 %
FVC-%Change-Post: -8 %
FVC-%Pred-Post: 52 %
FVC-%Pred-Pre: 57 %
FVC-Post: 1.45 L
FVC-Pre: 1.58 L
Post FEV1/FVC ratio: 46 %
Post FEV6/FVC ratio: 95 %
Pre FEV1/FVC ratio: 46 %
Pre FEV6/FVC Ratio: 93 %
RV % pred: 191 %
RV: 4.56 L
TLC % pred: 121 %
TLC: 6.24 L

## 2022-09-30 NOTE — Patient Instructions (Signed)
Full PFT performed today. °

## 2022-09-30 NOTE — Patient Instructions (Signed)
Keep your appointment in June with the [facility] doctor to consider the Zephyr Valve procedure.  Once you know what your timing will be, call back and leave me a message when you are ready for [place] to order the updated sleep study. This will be at the sleep center because of your oxygen. Once that is done we can see about either an oral appliance or maybe Inspire nerve stimulator, since you did not tolerate a long trial with CPAP in the past.    [phone]    leave message for Dr Maple Hudson

## 2022-09-30 NOTE — Progress Notes (Signed)
Full PFT performed today. °

## 2022-10-04 ENCOUNTER — Telehealth: Payer: Self-pay

## 2022-10-04 NOTE — Telephone Encounter (Signed)
Last Prolia inj: 06/23/22 Next Prolia inj DUE:  12/22/22

## 2022-10-04 NOTE — Telephone Encounter (Signed)
  PFt table gold stage 3 copd since 2017  Plan  - give first avail to see me 15 min visit     Latest Ref Rng & Units 09/30/2022    3:52 PM 04/15/2016    1:02 PM 11/05/2015   12:08 PM 10/21/2015   10:56 AM  PFT Results  FVC-Pre L 1.58  P 1.41  1.50  1.66   FVC-Predicted Pre % 57  P 58  63  69   FVC-Post L 1.45  P 1.38  1.50  1.83   FVC-Predicted Post % 52  P 57  63  77   Pre FEV1/FVC % % 46  P 45  41  44   Post FEV1/FCV % % 46  P 47  46  48   FEV1-Pre L 0.73  P 0.64  0.62  0.73   FEV1-Predicted Pre % 35  P 34  33  39   FEV1-Post L 0.67  P 0.65  0.69  0.88   DLCO uncorrected ml/min/mmHg 9.41  P 9.45     DLCO UNC% % 48  P 36     DLCO corrected ml/min/mmHg 9.41  P 9.47     DLCO COR %Predicted % 48  P 37     DLVA Predicted % 67  P 60     TLC L 6.24  P     TLC % Predicted % 121  P     RV % Predicted % 191  P       P Preliminary result

## 2022-10-05 ENCOUNTER — Ambulatory Visit (INDEPENDENT_AMBULATORY_CARE_PROVIDER_SITE_OTHER): Payer: 59 | Admitting: Internal Medicine

## 2022-10-05 VITALS — Ht 64.5 in | Wt 203.6 lb

## 2022-10-05 DIAGNOSIS — J449 Chronic obstructive pulmonary disease, unspecified: Secondary | ICD-10-CM | POA: Diagnosis not present

## 2022-10-05 DIAGNOSIS — J9611 Chronic respiratory failure with hypoxia: Secondary | ICD-10-CM | POA: Diagnosis not present

## 2022-10-05 DIAGNOSIS — Z129 Encounter for screening for malignant neoplasm, site unspecified: Secondary | ICD-10-CM | POA: Diagnosis not present

## 2022-10-05 NOTE — Patient Instructions (Addendum)
COPD, severe (HCC) Chronic respiratory failure with hypoxia (HCC)  -Stable disease  with PFT stable since 2017  Plan -Continue oxygen, Trelegy and Daliresp as before. -  support motorized scooter/wheel chair  due to copd, obesity, OA  - I do not have resources r skill set to do detailed mobility assessment that this might require - keep  - keep  [facility] for [place] valve evaluation   Cancer screening  -No evidence of lung cancer in August 2020 low-dose CT scan of the chest and feb 2022 ad mar 2023  Plan -Repeat CT scan of the chest June 2024  - get details of appointment from [facility] 10/05/2022    Follow-up -3-4 months with Victoria [name]

## 2022-10-05 NOTE — Progress Notes (Signed)
OV 07/20/2017  Chief Complaint  Patient presents with   Follow-up    Follow up visit. Pt also needing surgical clearance for lumpectomy.  Pt saw RB beginning February annd was givien prednisone. Pt states breathing has been tolerable with not taking prednisone. DME: [employer], 2L continuous at home 3L pulse when out     Follow-up severe COPD patient with obesity and sleep apnea.  She says she is compliant with all her COPD inhalers.  She is also compliant with her CPAP.  She is not smoking anymore.  Overall COPD CAT score is 16.  She uses oxygen all the time and CPAP at night.  She is due for breast surgery lumpectomy and is here for a preoperative evaluation.  She tells me that she is in good nutritional status.  She is not anemic.  She has got good kidney function.  She says the surgery will be elective and less than 2 hours and does not involve operating on the neck or the chest or the abdomen.  It will be done at [facility] and under experience surgeon.  Overall she is very functional and active despite his severe COPD.   OV 09/14/2017  Chief Complaint  Patient presents with   Follow-up    Pt states she has been doing better since her visit with Dr. Craige Cotta last week. Pt is currently taking an abx and just finished last dose of prednisone today. Pt's cough has become better and is still wheezing some. SOB is about the same.    Follow-up advanced COPD with obesity and sleep apnea and chronic hypoxemic respiratory failure  I last saw her in March 2019 which itself followed in exacerbation.  Since then she has had 2 visits in my office with 2 providers for COPD exacerbation.  Most recently September 09, 2017.  Chest x-ray reviewed but did not visualize and the x-ray is clear.  [Of note she had low-dose CT scan of the chest May 2018 and is due for one-year CT scan coming up anytime now] her baseline COPD CAT scan.  She is using oxygen between 2-3 L.  She feels her exacerbation is slowly  resolving but she is still symptomatic COPD CAT score is 24 and much worse than baseline although this is improved compared to last week.  She did have breast cancer surgery recently and this went well.  Currently breast cancer in remission.  Review of her medication shows that while she was in the hospital nebulized bronchodilator long-acting beta agonist was tried and she really liked it.  Therefore on September 09, 2017 visit her combination inhaler Stiolto was changed to nebulizer [place] she is here to start this.and Yupelri.  Review of her chart shows that she is not on inhaled corticosteroid.  Sometime back she was on Arnuity but she is not taking it due to lack of perceived benefit.  But now she is having recurrent exacerbations.  Therefore she is open to starting it again     OV 02/09/2018  Subjective:  Patient ID: Victoria [name], female , DOB: March 26, 1946 , age 77 y.o. , MRN: [id number] , ADDRESS: [address] [place] [address]   02/09/2018 -   Chief Complaint  Patient presents with   Follow-up    Pt states she has been okay since last visit. States she is still having problems becoming SOB when she exerts herself and has occ cough when she has the SOB. Denies any complaints of  wheezing or CP. Pt does develop chest tightness when she has problems with SOB.     HPI Victoria [name] 77 y.o. -with advanced COPD and chronic hypoxemic respiratory failure.  Presents for routine follow-up.  Few weeks ago she had an exacerbation recall an antibiotic and prednisone.  After that she is improved back to her baseline with COPD CAT score shown below 15 and the symptomatology described.  She has new onset of pedal edema for the last 3 days.  This is associated with increased dietary intake of soda and also salted chips.  There is no fever or rash or wheezing.  Her COPD medications include nebulizers.  She was changed to the sometime back by nurse practitioner.  She is asking about going back to inhaler  particularly Stiolto.  She is willing to try Trelegy         OV 07/20/2018  Subjective:  Patient ID: Victoria [name], female , DOB: 29-Jul-1945 , age 77 y.o. , MRN: [id number] , ADDRESS: [address] [place] [address]   07/20/2018 -   Chief Complaint  Patient presents with   Follow-up    Pt states her breathing has become worse since last visit, has an occ dry cough, and also has some tightness in chest.   Follow-up) COPD with chronic hypoxemic respiratory failure.  On Trelegy, oxygen 2 L and Daliresp.  HPI Victoria [name] 77 y.o. -presents for routine follow-up.  Since last seeing me he saw a nurse practitioner for follow-up.  She tells me for the last 3 days she is has increased shortness of breath and increased chest tightness and wheezing but no increase in cough or sputum production.  This fits in with a slight increase in COPD CAT score to 18.  She takes 2 L of oxygen at home and 3 L with a portable system.  She continues on Daliresp without problems and also Trelegy inhaler.  Viral exposure or travel history -   Is negative.  Last CT scan of the chest was in June 2019 showed only emphysema.  This was for lung cancer screening.  Last pulmonary function test was in 2017.  Most recent blood work reviewed was in October 2019 and normal.   Also c/p bad knees and morning stiffness and arthralgia/arthritis for long tme. No dx opf RA     OV 01/17/2019  Subjective:  Patient ID: Victoria [name], female , DOB: 10/29/1945 , age 77 y.o. , MRN: [id number] , ADDRESS: [address] [place] [address]  Follow-up) COPD with chronic hypoxemic respiratory failure.  On Trelegy, oxygen 2 L and Daliresp.  01/17/2019 -  Copd followup   HPI Victoria [name] 77 y.o. -returns for follow-up of advanced COPD.  Overall she is stable.  She is doing good with social distancing.  She has not had any COPD exacerbations.  She is having some dryness of the nose with the oxygen for the last few  days and this is causing burning and irritation.  She tried some saline nasal spray yesterday and that seems to help.  She had a low-dose CT scan for lung cancer screening and her lung nodules are stable.  Repeat CT scan is recommended in 1 year.  She is compliant with her oxygen, Trelegy and Daliresp.  This combination is helping her.  COPD CAT score is 16.  She is having some economic difficulties because of the pandemic but otherwise is doing okay.    IMPRESSION: 1. Lung-RADS 2,  benign appearance or behavior. Continue annual screening with low-dose chest CT without contrast in 12 months. 2. Aortic atherosclerosis (ICD10-I70.0), coronary artery atherosclerosis and emphysema (ICD10-J43.9). 3. Similar left breast nodule of 8 mm. The patient had a negative left-sided diagnostic mammogram on 12/04/2018. 4. Similar left adrenal adenomas.     Electronically Signed   By: Jeronimo Greaves M.D.   On: 01/10/2019 14:34     OV 07/25/2019  Subjective:  Patient ID: Victoria [name], female , DOB: [birth year] , age 87 y.o. , MRN: [id number] , ADDRESS: [address] [place] [address]   07/25/2019 -   Chief Complaint  Patient presents with   Follow-up    Pt states she has been doing okay since last visit. Pt states she still becomes SOB with activities but states breathing is stable.   Follow-up) COPD with chronic hypoxemic respiratory failure.  On Trelegy, oxygen 2 L and Daliresp.  HPI Victoria [name] 77 y.o. -presents for follow-up of her COPD and chronic hypoxic respiratory failure.  She tells me that recently she has been forgetting to use her oxygen while at rest at home a few hours later when she checks her pulse ox is 93%.  She has been surprised by how good this is.  Overall COPD stable.  When she exerts herself she feels she needs 3 L because she gets short of breath although she is not checked her pulse ox with exertion.  She is on Trelegy and Daliresp and oxygen 24/7.  Cancer  screening: Last CT scan of the chest August 2020 and this was without any evidence of lung cancer.  Follow-up in 1 year is recommended.  Out of work: I did a letter for her on the assumption she wanted to go back to work.  However she tells me that she is very afraid to go back to work.  This is because she feels she will get COVID-19 at work.  So she wants me to redo this letter.  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  At this point in time patient is vaccinated but her client is not vaccinated fully.  Declined second dose is pending at the end of this month 2021.  There are other people at work or not vaccinated.  She has managed to avoid getting Covid by staying within her safety bubble, social distancing and wearing a mask.  CDC guidelines yesterday indicate that vaccinated people can gather indoors with other vaccinated people without a mask.  In my interpretation of the CDC guidelines it appears this is more relevant to family situation as opposed to work.  She has advanced COPD and she could be at an extremely high risk of death if she were to get COVID-19.  Therefore I would support her request to stay off work for a few more months till further guidelines from [facility] regarding work-up delineated and also more people in the community are vaccinated.  Noted: She has not been to work in a year.   CAT Score 07/25/2019 09/14/2017  Total CAT Score 12 24     ROS - per HPI  04/30/2020 Follow up : COPD , O2 RF , OSA   77 year old female former smoker followed for severe gold 3 COPD and oxygen pendant respiratory failure, obstructive sleep apnea nocturnal CPAP Breast cancer history diagnosed in 2019 status post lumpectomy.  She did not receive chemo or radiation therapy. On  Arimidex.   TEST/EVENTS :  Pulmonary function test June 2017: stage III COPD FEV1 0.88 L/47% in 10/21/2015 and 0.69 L/37% later in June  2017 CT chest lung cancer screening 10/09/2015: Emphysema with very small lung nodules Echo January 2016: Shows grade 1 diastolic dysfunction Nuclear medicine cardiac stress to 12/11/2015: Normal tests Lexiscan Myoview with ejection fraction 75% PSG 12/2014 >AHI 35/h  12/2018 Lung-RADS 2, benign appearance or behavior. Continue annual screening with low-dose chest CT without contrast in 12 months.   Patient presents for a 41-month follow-up.  Patient has underlying severe COPD.  She remains on Trelegy daily and Daliresp daily .  Says overall breathing is doing okay. Gets winded with activity . No flare of cough or wheezing  Needs new neb machine and refills of albuterol neb. Machine is too old.   Patient participates in the low-dose CT screening program.  CT chest on January 11, 2020 showed a lung RADS 3.  She had multiple small nodules measuring up to 5.9 mm.  She has been planned for a follow-up CT chest in 6 months.  She has chronic respiratory failure is on oxygen 3 L on her portable system at 2 L at home.  Says she is had no increased oxygen demands.  She gets winded with minimum activity.  No change in her activity tolerance  She remains busy.  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   She does have sleep apnea but is CPAP intolerant.  Covid vaccines are up-to-date. Needs booster and flu shot .   OV 11/18/2020  Subjective:  Patient ID: Victoria [name], female , DOB: 26-Nov-1945 , age 25 y.o. , MRN: [id number] , ADDRESS: [address] [place] [address] PCP Etta Grandchild, MD Patient Care Team: Etta Grandchild, MD as PCP - General (Internal Medicine) Lennette Bihari, MD as PCP - Cardiology (Cardiology) Kalman Shan, MD as Consulting Physician (Pulmonary Disease) Richardean Chimera, MD as Consulting Physician (Obstetrics and Gynecology) Magrinat, Valentino Hue, MD as Consulting Physician  (Oncology) Emelia Loron, MD as Consulting Physician (General Surgery) Antony Blackbird, MD as Consulting Physician (Radiation Oncology) Lennette Bihari, MD as Consulting Physician (Cardiology) Axel Filler, Larna Daughters, NP as Nurse Practitioner (Hematology and Oncology) Parrett, Virgel Bouquet, NP as Nurse Practitioner (Pulmonary Disease) Romero Belling, MD as Consulting Physician (Endocrinology) Kathyrn Sheriff, [facility] as Pharmacist (Pharmacist)  This Provider for this visit: Treatment Team:  Attending Provider: Kalman Shan, MD    11/18/2020 -   Chief Complaint  Patient presents with   Follow-up    Pt states her breathing has become some worse since last visit. States the heat worsens her breathing.     HPI Victoria [name] 77 y.o. -  Follow-up advanced COPD: Presents for follow-up.  She is on Trelegy and Daliresp and oxygen.  She uses oxygen 2 L at rest.  With the portable system she goes up to 3 L.  Overall she is stable without any exacerbations.  She ran out of diuretics approximately a week ago my CMA is refill this.  She is complaining that her portable oxygen system was too heavy.  She told me it is 20 pounds.  I carried it and is much lighter.  Later she admitted this was 5.6 pounds.  She wants something even lighter while doing groceries.  She is interested in [facility] system.  We looked up G for model and it is  3.3 pounds with battery and she wants this.  I advised her that the insurance might not cover this.  I also advised her that if insurance covers this there she will have to return the current system which is somewhat reluctant.  I advised her that our [facility] can try to get this approved but in the absence of which she will have to try to pay for it for herself out-of-pocket which apparently cost $3000.  She will contact the company to see if there is a payment plan on this.  I advised that she could use a backpack and I gave her a reference for that.  Lung cancer  screening: No evidence of lung cancer in February 2022 CT chest.  1 year follow-up recommended.   Obesity: Following a low carbohydrate diet sheet that I recommended once before and is losing weight.  Sleep apnea: Uses CPAP at night   Mobiity - due to copd/OA/Obbesity - needs motorized wheel chair  CAT Score 07/25/2019 09/14/2017  Total CAT Score 12 24    01/14/2022 Follow up : Covid 19 infection , COPD , O2 RF  Patient presents for 2-week follow-up.  Patient was seen last visit via virtual visit.  She had tested positive for COVID-19 infection after going to a family reunion.  She was outside the window for antiviral therapy.  She was treated for an COPD exacerbation with a Z-Pak and prednisone taper.  Patient says since last visit she is feeling better.  She has decreased cough congestion.  Still remains tired and fatigued.  She remains on Trelegy inhaler daily.  She is on Daliresp daily.  She denies any hemoptysis, chest pain, orthopnea, increased edema.  She remains on oxygen 2 L.  Denies any increased oxygen demands. Uses a POC with 3l/m pulsed oxygen.  Eating well. No n/v/d. Remains tired with low energy.  Participates in Lung cancer screening program . CT chest 09/21/21 Lung Rads 2 , Emphysema , stable tiny nodules. No suspicious nodules .  Still working to get scooter has hard time getting around. Gets winded with minimal activity  Also looking at lighter weight POC as simply go is too heavy .    OV 06/03/2022  Subjective:  Patient ID: Victoria [name], female , DOB: 08/18/1945 , age 34 y.o. , MRN: [id number] , ADDRESS: [address] [place] [address] PCP Etta Grandchild, MD Patient Care Team: Etta Grandchild, MD as PCP - General (Internal Medicine) Lennette Bihari, MD as PCP - Cardiology (Cardiology) Kalman Shan, MD as Consulting Physician (Pulmonary Disease) Richardean Chimera, MD as Consulting Physician (Obstetrics and Gynecology) Magrinat, Valentino Hue, MD (Inactive) as  Consulting Physician (Oncology) Emelia Loron, MD as Consulting Physician (General Surgery) Antony Blackbird, MD as Consulting Physician (Radiation Oncology) Lennette Bihari, MD as Consulting Physician (Cardiology) Axel Filler, Larna Daughters, NP as Nurse Practitioner (Hematology and Oncology) Parrett, Virgel Bouquet, NP as Nurse Practitioner (Pulmonary Disease) Romero Belling, MD (Inactive) as Consulting Physician (Endocrinology) Ellin Saba, [facility] (Inactive) (Pharmacist)  This Provider for this visit: Treatment Team:  Attending Provider: Kalman Shan, MD    06/03/2022 -   Chief Complaint  Patient presents with   Follow-up    Mobile scooter, research program for emphysema ??       HPI Victoria [name] 77 y.o. -presents for follow-up.  I personally not seen her since July 2022.  In the interim she saw a Publishing rights manager.  She missed the December 2023 appointment.  At this  point in time she continues to be stable on triple inhaler therapy and oxygen.  Her last COPD exacerbation was in summer 2023.  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  In the vending stall there is a lady who expressed to her that it was a procedure offered at [facility].  I suspect this might be the Zephyr valve procedure.  Patient was not sure.  Showed a YouTube video about this and we took a shared decision making to make a referral.  She is also inquiring about motorized wheelchair.  Last CT scan of the chest was in March 2023 and there was no evidence of lung cancer at that time.  She should have a follow-up CT scan in the spring 2024/summer 2024 range.     OV 10/05/2022  Subjective:  Patient ID: Victoria [name], female , DOB: Apr 11, 1946 , age 14 y.o. , MRN: [id number] , ADDRESS: [address] [place] [address] PCP Etta Grandchild, MD Patient Care Team: Etta Grandchild, MD as PCP - General (Internal Medicine) Lennette Bihari, MD as PCP - Cardiology  (Cardiology) Kalman Shan, MD as Consulting Physician (Pulmonary Disease) Richardean Chimera, MD as Consulting Physician (Obstetrics and Gynecology) Magrinat, Valentino Hue, MD (Inactive) as Consulting Physician (Oncology) Emelia Loron, MD as Consulting Physician (General Surgery) Antony Blackbird, MD as Consulting Physician (Radiation Oncology) Lennette Bihari, MD as Consulting Physician (Cardiology) Axel Filler, Larna Daughters, NP as Nurse Practitioner (Hematology and Oncology) Parrett, Virgel Bouquet, NP as Nurse Practitioner (Pulmonary Disease) Romero Belling, MD (Inactive) as Consulting Physician (Endocrinology) Ellin Saba, [place] (Inactive) (Pharmacist)  This Provider for this visit: Treatment Team:  Attending Provider: Kalman Shan, MD    10/05/2022 -   Chief Complaint  Patient presents with   Follow-up    F/up on PFT     HPI Victoria [name] 77 y.o. -follow-up Gold stage III COPD with chronic respiratory failure.  After her last visit she has had pulmonary function test FEV1 of 0.73 L / 35% with a DLCO of 9.41/48%.  She continues to use oxygen.  She wanted a referral to [facility] valve evaluation.  Apparently she is being referred to some doctor in [place].  She has an appointment pending November 01, 2022.  She cannot mention the name other than the fact the last time sounded [place].  She is also post have low-dose CT scan of the chest for lung cancer screening prior to this visit but that is scheduled for later in June 2024.  She wanted help to remember the time and date and location.  I will ask my CMA to give her this information.  She still try to get a mobile scooter.  In fact when I walked in she was on the phone with's scooter company.  She was upset that they could not give her foldable scooter via insurance because she still drives around.  Now that she is compliantly taking her medicines that include oxygen, Daliresp and Trelegy.  She is not in  exacerbation.    PFT     Latest Ref Rng & Units 09/30/2022    3:52 PM 04/15/2016    1:02 PM 11/05/2015   12:08 PM 10/21/2015   10:56 AM  PFT Results  FVC-Pre L 1.58  P 1.41  1.50  1.66   FVC-Predicted Pre % 57  P 58  63  69   FVC-Post L 1.45  P 1.38  1.50  1.83   FVC-Predicted  Post % 52  P 57  63  77   Pre FEV1/FVC % % 46  P 45  41  44   Post FEV1/FCV % % 46  P 47  46  48   FEV1-Pre L 0.73  P 0.64  0.62  0.73   FEV1-Predicted Pre % 35  P 34  33  39   FEV1-Post L 0.67  P 0.65  0.69  0.88   DLCO uncorrected ml/min/mmHg 9.41  P 9.45     DLCO UNC% % 48  P 36     DLCO corrected ml/min/mmHg 9.41  P 9.47     DLCO COR %Predicted % 48  P 37     DLVA Predicted % 67  P 60     TLC L 6.24  P     TLC % Predicted % 121  P     RV % Predicted % 191  P       P Preliminary result       has a past medical history of Abdominal aneurysm (HCC), Anemia, Arthritis, Breast cancer in female [facility]), COPD (chronic obstructive pulmonary disease) (HCC), Dyspnea, Headache, Heart murmur, Hyperlipidemia, Hypertension, Multiple thyroid nodules, Murmur, cardiac (1950), Osteoporosis, Pneumonia, Pre-diabetes, Requires continuous at home supplemental oxygen, Sebaceous cyst, Sleep apnea, and Varicella (as child).   reports that she quit smoking about 11 years ago. Her smoking use included cigarettes. She has a 50.00 pack-year smoking history. She has never used smokeless tobacco.  Past Surgical History:  Procedure Laterality Date   APPENDECTOMY     2008   BREAST LUMPECTOMY Left 08/31/2017   BREAST LUMPECTOMY WITH RADIOACTIVE SEED LOCALIZATION Left 08/10/2017   Procedure: BREAST LUMPECTOMY WITH RADIOACTIVE SEED LOCALIZATION;  Surgeon: Emelia Loron, MD;  Location: [facility] OR;  Service: General;  Laterality: Left;   CESAREAN SECTION     1972   COLONOSCOPY     COLONOSCOPY WITH PROPOFOL N/A 04/16/2016   Procedure: COLONOSCOPY WITH PROPOFOL;  Surgeon: Jeani Hawking, MD;  Location: [facility] ENDOSCOPY;  Service: Endoscopy;   Laterality: N/A;   CYST REMOVAL NECK Left 04/13/2016   Procedure: EXCISION OF SEBACEOUS CYST LEFT POSTERIOR NECK;  Surgeon: Darnell Level, MD;  Location: [facility] OR;  Service: General;  Laterality: Left;   Excision of Pelvic Absess, Right Ovary     2008   RE-EXCISION OF BREAST CANCER,SUPERIOR MARGINS Left 08/31/2017   Procedure: RE-EXCISION OF LEFT  BREAST MARGINS ERAS PATHWAY;  Surgeon: Emelia Loron, MD;  Location: [facility] OR;  Service: General;  Laterality: Left;   TUBAL LIGATION  1980    Allergies  Allergen Reactions   Benicar [Olmesartan] Swelling    Swelling of face and arms    [name] [Valsartan] Swelling    Swelling of face and arms    Hydrocodone-Acetaminophen Nausea And Vomiting    Severe vomiting   Lisinopril Cough   Monosodium Glutamate Other (See Comments)    Facial swelling per pt   Codeine Other (See Comments)    jittery   Lead Acetate Rash   Nickel Rash    Severe rash to infection: pt is allergic to all metals other than sterling silver or gold jewelry.     Immunization History  Administered Date(s) Administered   Fluad Quad(high Dose 65+) 01/17/2019, 04/30/2020, 02/23/2021, 06/15/2022   Influenza Split 01/16/2011   Influenza, High Dose Seasonal PF 01/13/2017, 02/09/2018   Influenza,inj,Quad PF,6+ Mos 02/01/2013, 02/27/2014, 04/07/2015, 01/08/2016   PFIZER(Purple Top)SARS-COV-2 Vaccination 06/06/2019, 06/27/2019, 05/21/2020   Pneumococcal Conjugate-13 02/01/2013  Pneumococcal Polysaccharide-23 10/29/2014, 06/03/2021   Td 12/06/2011   Zoster Recombinat (Shingrix) 11/21/2019, 05/12/2020    Family History  Problem Relation Age of Onset   Heart disease Mother        MI - fatal   Hypertension Mother    Stroke Father 30       fatal   Alzheimer's disease Father    Alzheimer's disease Brother    Hyperlipidemia Brother    Hypertension Brother    Diabetes Brother    Hypertension Brother    Hyperlipidemia Brother    Breast cancer Paternal Grandmother       Current Outpatient Medications:    Accu-Chek Softclix Lancets lancets, Use as instructed to check blood sugar 3X daily, Disp: 100 each, Rfl: 1   acetaminophen (TYLENOL) 500 MG tablet, Take 1,000 mg by mouth every 6 (six) hours as needed., Disp: , Rfl:    albuterol (PROVENTIL) (2.5 MG/3ML) 0.083% nebulizer solution, Take 3 mLs (2.5 mg total) by nebulization every 6 (six) hours as needed for wheezing or shortness of breath., Disp: 75 mL, Rfl: 3   Albuterol Sulfate (PROAIR RESPICLICK) 108 (90 Base) MCG/ACT AEPB, Inhale 1-2 puffs into the lungs every 6 (six) hours as needed (for wheezing/shortness of breath)., Disp: 1 each, Rfl: 5   amLODipine (NORVASC) 10 MG tablet, TAKE ONE TABLET BY MOUTH ONCE DAILY, Disp: 90 tablet, Rfl: 2   anastrozole (ARIMIDEX) 1 MG tablet, Take 1 tablet (1 mg total) by mouth daily., Disp: 90 tablet, Rfl: 3   aspirin EC 81 MG tablet, Take 1 tablet (81 mg total) by mouth daily., Disp: 90 tablet, Rfl: 3   Blood Glucose Monitoring Suppl (ACCU-CHEK AVIVA PLUS) w/Device KIT, 1 Device by Does not apply route daily. Use to monitor glucose levels once per day; E11.9, Disp: 1 kit, Rfl: 0   carvedilol (COREG) 6.25 MG tablet, TAKE ONE TABLET BY MOUTH EVERY MORNING and TAKE ONE TABLET BY MOUTH EVERYDAY AT BEDTIME, Disp: 180 tablet, Rfl: 2   Cholecalciferol (VITAMIN D3) 125 MCG (5000 UT) CAPS, Take 1 capsule (5,000 Units total) by mouth daily., Disp: 90 capsule, Rfl: 1   DALIRESP 500 MCG TABS tablet, TAKE ONE TABLET BY MOUTH EVERY MORNING, Disp: 90 tablet, Rfl: 3   dapagliflozin propanediol (FARXIGA) 10 MG TABS tablet, Take 5 mg by mouth daily., Disp: , Rfl:    denosumab (PROLIA) 60 MG/ML SOSY injection, Inject 60 mg into the skin every 6 (six) months., Disp: 1 mL, Rfl: 1   Fluticasone-Umeclidin-Vilant (TRELEGY ELLIPTA) 100-62.5-25 MCG/ACT AEPB, INHALE 1 PUFF BY MOUTH INTO LUNGS DAILY, Disp: 60 each, Rfl: 5   glucose blood (ACCU-CHEK AVIVA PLUS) test strip, USE TO Check blood glucose  THREE TIMES DAILY, Disp: 300 strip, Rfl: 5   glucose blood (ACCU-CHEK GUIDE) test strip, USE 1 STRIP ONCE DAILY, Disp: 100 each, Rfl: 0   glucose blood test strip, USE 1 STRIP ONCE DAILY, Disp: 100 each, Rfl: 12   methocarbamol (ROBAXIN) 500 MG tablet, Take 1 tablet (500 mg total) by mouth every 8 (eight) hours as needed for muscle spasms., Disp: 30 tablet, Rfl: 0   OXYGEN, Inhale 2-3 L into the lungs continuous. When exerting self, Disp: , Rfl:    potassium chloride SA (KLOR-CON) 20 MEQ tablet, Take 1 tablet (20 mEq total) by mouth 2 (two) times daily., Disp: 180 tablet, Rfl: 0   rosuvastatin (CRESTOR) 40 MG tablet, TAKE ONE TABLET BY MOUTH EVERYDAY AT BEDTIME, Disp: 90 tablet, Rfl: 2   SitaGLIPtin-MetFORMIN HCl (  JANUMET XR) 50-1000 MG TB24, Take 2 tablets by mouth daily before supper., Disp: 180 tablet, Rfl: 3   spironolactone (ALDACTONE) 25 MG tablet, TAKE ONE TABLET BY MOUTH ONCE DAILY, Disp: 90 tablet, Rfl: 2   thiamine (VITAMIN B-1) 100 MG tablet, Take 1 tablet (100 mg total) by mouth every other day., Disp: 45 tablet, Rfl: 1      Objective:   Vitals:   10/05/22 1008  Weight: 203 lb 9.6 oz (92.4 kg)  Height: 5' 4.5" (1.638 m)    Estimated body mass index is 34.41 kg/m as calculated from the following:   Height as of this encounter: 5' 4.5" (1.638 m).   Weight as of this encounter: 203 lb 9.6 oz (92.4 kg).  @WEIGHTCHANGE @  [employer]   10/05/22 1008  Weight: 203 lb 9.6 oz (92.4 kg)     Physical Exam   General: No distress. agree O2 at rest: YES Cane present: NO Sitting in wheel chair: yes Frail: no Obese: yes Neuro: Alert and Oriented x 3. GCS 15. Speech normal Psych: Pleasant Resp:  Barrel Chest - yes.  Wheeze - no, Crackles - no, No overt respiratory distress CVS: Normal heart sounds. Murmurs - no Ext: Stigmata of Connective Tissue Disease - no HEENT: Normal upper airway. PEERL +. No post nasal drip        Assessment:       ICD-10-CM   1. Chronic  respiratory failure with hypoxia (HCC)  J96.11 CT Chest Wo Contrast    2. COPD, severe (HCC)  J44.9     3. Cancer screening  Z12.9          Plan:     Patient Instructions  COPD, severe (HCC) Chronic respiratory failure with hypoxia (HCC)  -Stable disease  with PFT stable since 2017  Plan -Continue oxygen, Trelegy and Daliresp as before. -  support motorized scooter/wheel chair  due to copd, obesity, OA  - I do not have resources r skill set to do detailed mobility assessment that this might require - keep  - keep  [facility] for [place] valve evaluation   Cancer screening  -No evidence of lung cancer in August 2020 low-dose CT scan of the chest and feb 2022 ad mar 2023  Plan -Repeat CT scan of the chest June 2024  - get details of appointment from [facility] 10/05/2022    Follow-up -3-4 months with Dr Lavell Anchors    Dr. Kalman Shan, M.D., F.C.C.P,  Pulmonary and Critical Care Medicine Staff Physician, [facility] Director - Interstitial Lung Disease  Program  Pulmonary Fibrosis [facility] at [facility] [place], [place], [address]  Pager: [phone], If no answer or between  15:00h - 7:00h: call [phone] Telephone: [phone]  6:12 PM 10/05/2022

## 2022-10-08 ENCOUNTER — Telehealth: Payer: Self-pay | Admitting: Internal Medicine

## 2022-10-08 NOTE — Telephone Encounter (Signed)
Patient states [employer] needs detail information for scooter. Will be faxing forms to [place]. Patient phone number is [phone].

## 2022-10-12 DIAGNOSIS — I10 Essential (primary) hypertension: Secondary | ICD-10-CM | POA: Diagnosis not present

## 2022-10-12 DIAGNOSIS — G4733 Obstructive sleep apnea (adult) (pediatric): Secondary | ICD-10-CM | POA: Diagnosis not present

## 2022-10-12 DIAGNOSIS — J449 Chronic obstructive pulmonary disease, unspecified: Secondary | ICD-10-CM | POA: Diagnosis not present

## 2022-10-13 NOTE — Telephone Encounter (Signed)
Noted  

## 2022-10-20 ENCOUNTER — Encounter: Payer: Self-pay | Admitting: Internal Medicine

## 2022-10-20 NOTE — Assessment & Plan Note (Signed)
Severe COPD for which she is followed by Dr. Marchelle Gearing care and pending consideration of Zeph[place] valves by [facility].

## 2022-10-20 NOTE — Assessment & Plan Note (Signed)
She had struggled with CPAP.  We discussed updating sleep study but she wants to wait on this until she has discussed Zephyr valve management with [facility] in mid June.  After that, depending on results of sleep study, she might be interested in an oral appliance or even Inspire.

## 2022-10-22 ENCOUNTER — Telehealth: Payer: Self-pay | Admitting: Internal Medicine

## 2022-10-22 NOTE — Telephone Encounter (Signed)
I will follow up with [employer] Monday to re-fax paper work

## 2022-10-26 ENCOUNTER — Other Ambulatory Visit: Payer: Self-pay

## 2022-10-26 ENCOUNTER — Ambulatory Visit
Admission: RE | Admit: 2022-10-26 | Discharge: 2022-10-26 | Disposition: A | Discharge status: Home / Self Care | Payer: 59 | Source: Ambulatory Visit | Attending: Internal Medicine | Admitting: Internal Medicine

## 2022-10-26 DIAGNOSIS — J449 Chronic obstructive pulmonary disease, unspecified: Secondary | ICD-10-CM

## 2022-10-26 NOTE — Telephone Encounter (Signed)
Called and and spoke with pt, pt was wanting a mobilized scooter from [employer], she did have an order placed for [employer] back in January. [employer] afford her a scooter that was way to big for her to manage, and at that time they were limited on mobility scooters. Pt has asked if we can send an order to [facility]. I have informed pt that I will place the order to [employer] but we are unsure if this will be an out of pocket item. I have also placed na order to [employer] just incase. Pt verbalized understanding. [facility] this will be sent as a [place]

## 2022-10-26 NOTE — Telephone Encounter (Signed)
PT is calling to check on the status of this issue below. Please call @ [phone]

## 2022-11-01 ENCOUNTER — Telehealth: Payer: Self-pay | Admitting: Internal Medicine

## 2022-11-01 DIAGNOSIS — J449 Chronic obstructive pulmonary disease, unspecified: Secondary | ICD-10-CM | POA: Diagnosis not present

## 2022-11-01 DIAGNOSIS — R0609 Other forms of dyspnea: Secondary | ICD-10-CM | POA: Diagnosis not present

## 2022-11-01 DIAGNOSIS — J432 Centrilobular emphysema: Secondary | ICD-10-CM | POA: Diagnosis not present

## 2022-11-01 DIAGNOSIS — C50412 Malignant neoplasm of upper-outer quadrant of left female breast: Secondary | ICD-10-CM | POA: Diagnosis not present

## 2022-11-01 DIAGNOSIS — R918 Other nonspecific abnormal finding of lung field: Secondary | ICD-10-CM | POA: Diagnosis not present

## 2022-11-01 DIAGNOSIS — Z170 Estrogen receptor positive status [ER+]: Secondary | ICD-10-CM | POA: Diagnosis not present

## 2022-11-01 DIAGNOSIS — J9611 Chronic respiratory failure with hypoxia: Secondary | ICD-10-CM | POA: Diagnosis not present

## 2022-11-01 NOTE — Telephone Encounter (Signed)
[facility]  calling for copy of CT.  [phone] is Meagan

## 2022-11-02 ENCOUNTER — Telehealth: Payer: Self-pay | Admitting: Internal Medicine

## 2022-11-02 ENCOUNTER — Other Ambulatory Visit: Payer: 59

## 2022-11-02 DIAGNOSIS — J449 Chronic obstructive pulmonary disease, unspecified: Secondary | ICD-10-CM

## 2022-11-02 NOTE — Telephone Encounter (Signed)
Please refer to pulmonary rehabilitation on account of Gold stage III COPD      Latest Ref Rng & Units 09/30/2022    3:52 PM 04/15/2016    1:02 PM 11/05/2015   12:08 PM 10/21/2015   10:56 AM  PFT Results  FVC-Pre L 1.58  1.41  1.50  1.66   FVC-Predicted Pre % 57  58  63  69   FVC-Post L 1.45  1.38  1.50  1.83   FVC-Predicted Post % 52  57  63  77   Pre FEV1/FVC % % 46  45  41  44   Post FEV1/FCV % % 46  47  46  48   FEV1-Pre L 0.73  0.64  0.62  0.73   FEV1-Predicted Pre % 35  34  33  39   FEV1-Post L 0.67  0.65  0.69  0.88   DLCO uncorrected ml/min/mmHg 9.41  9.45     DLCO UNC% % 48  36     DLCO corrected ml/min/mmHg 9.41  9.47     DLCO COR %Predicted % 48  37     DLVA Predicted % 67  60     TLC L 6.24      TLC % Predicted % 121      RV % Predicted % 191

## 2022-11-02 NOTE — Telephone Encounter (Signed)
Pt. Calling back today she went to the [facility] for [place] valve evaluation the dr. York Spaniel there if we can write referral to go to [facility] for her

## 2022-11-03 NOTE — Telephone Encounter (Signed)
Spoke with pt and notified her that Pulmonary Rehab referral will be placed. Pulmonary rehab order was placed. Nothing further needed at this time.

## 2022-11-03 NOTE — Telephone Encounter (Signed)
Called Victoria [name] at the number and there was no answer- LMTCB.

## 2022-11-05 ENCOUNTER — Telehealth (HOSPITAL_COMMUNITY): Payer: Self-pay

## 2022-11-05 NOTE — Telephone Encounter (Signed)
Received referral from Dr. Marchelle Gearing for this pt to participate in Pulmonary Rehab with the diagnosis of severe COPD. Clinical review of pt follow up appt on 10/05/22 Pulmonary office note. Pt appropriate for scheduling for Pulmonary rehab. Will forward to support staff for scheduling and verification of insurance eligibility/benefits with pt consent.   [facility] and Pul[facility] Rehab

## 2022-11-08 ENCOUNTER — Telehealth: Payer: Self-pay | Admitting: Internal Medicine

## 2022-11-08 NOTE — Telephone Encounter (Signed)
Pt called asking she can change providers pt stated she would like for Dr. Macario Golds to take her on as a pt.

## 2022-11-08 NOTE — Telephone Encounter (Signed)
I am ok with this  

## 2022-11-09 NOTE — Telephone Encounter (Signed)
Unfortunately, I'm not able to accept any more new patients at this time.  I'm sorry! Thank you!  

## 2022-11-10 ENCOUNTER — Telehealth: Payer: Self-pay | Admitting: Internal Medicine

## 2022-11-10 NOTE — Telephone Encounter (Signed)
Okay 

## 2022-11-10 NOTE — Telephone Encounter (Signed)
Dr. Yetta Barre pt wants to see if Hetty Blend would take her on as a pt it was okayed by Dr. Yetta Barre in the previous telephone note. Please advise.

## 2022-11-12 DIAGNOSIS — I10 Essential (primary) hypertension: Secondary | ICD-10-CM | POA: Diagnosis not present

## 2022-11-12 DIAGNOSIS — G4733 Obstructive sleep apnea (adult) (pediatric): Secondary | ICD-10-CM | POA: Diagnosis not present

## 2022-11-12 DIAGNOSIS — J449 Chronic obstructive pulmonary disease, unspecified: Secondary | ICD-10-CM | POA: Diagnosis not present

## 2022-11-12 NOTE — Telephone Encounter (Signed)
ATC LVMTCB x 2. Will close encounter per office policy.

## 2022-11-12 NOTE — Telephone Encounter (Signed)
Please try again so I can close encounter. TY.

## 2022-11-15 LAB — LAB REPORT - SCANNED
Calcium: 9.8
EGFR: 91
HM HIV Screening: NEGATIVE
PTH: 53

## 2022-11-22 ENCOUNTER — Telehealth: Payer: Self-pay | Admitting: Internal Medicine

## 2022-11-23 ENCOUNTER — Telehealth: Payer: Self-pay | Admitting: Internal Medicine

## 2022-11-23 ENCOUNTER — Other Ambulatory Visit (HOSPITAL_COMMUNITY): Payer: Self-pay

## 2022-11-23 NOTE — Telephone Encounter (Signed)
PT just got a call to resched her CT which is sched the same day she has a mammo. Please call back @ [phone]

## 2022-11-23 NOTE — Telephone Encounter (Signed)
Pt ready for scheduling for Prolia on or after : 12/22/22  Out-of-pocket cost due at time of visit: $327  Primary: [employer] - Medicare Prolia co-insurance: 20% Admin fee co-insurance: 20%  Secondary: n/a Prolia co-insurance:  Admin fee co-insurance:   Medical Benefit Details: Date Benefits were checked: 11/22/22 Deductible: no/ Coinsurance: 20%/ Admin Fee: 20%  Prior Auth: approved PA#  [id number] Expiration Date: 11/23/22 to 11/23/23   Pharmacy benefit: Copay $0 If patient wants fill through the pharmacy benefit please send prescription to:  [facility] , and include estimated need by date in rx notes. Pharmacy will ship medication directly to the office.  Patient not eligible for Prolia Copay Card. Copay Card can make patient's cost as little as $25. Link to apply: [email or web address]://[email or web address]  ** This summary of benefits is an estimation of the patient's out-of-pocket cost. Exact cost may very based on individual plan coverage.

## 2022-11-23 NOTE — Telephone Encounter (Signed)
Sending to Dr.v Marchelle Gearing as a FYI, Will also contact pt about rescheduling appointment until after ct.

## 2022-11-23 NOTE — Telephone Encounter (Signed)
Nevermind.She wants these to stay on the same day. NFN

## 2022-11-24 ENCOUNTER — Encounter (HOSPITAL_COMMUNITY)
Admission: RE | Admit: 2022-11-24 | Discharge: 2022-11-24 | Disposition: A | Discharge status: Home / Self Care | Payer: 59 | Source: Ambulatory Visit | Attending: Internal Medicine | Admitting: Internal Medicine

## 2022-11-24 ENCOUNTER — Other Ambulatory Visit: Payer: Self-pay | Admitting: Internal Medicine

## 2022-11-24 ENCOUNTER — Encounter (HOSPITAL_COMMUNITY): Payer: Self-pay

## 2022-11-24 ENCOUNTER — Other Ambulatory Visit: Payer: Self-pay | Admitting: Cardiovascular Disease

## 2022-11-24 VITALS — BP 162/58 | HR 75 | Resp 20 | Ht 64.0 in | Wt 200.2 lb

## 2022-11-24 DIAGNOSIS — E785 Hyperlipidemia, unspecified: Secondary | ICD-10-CM

## 2022-11-24 DIAGNOSIS — N1831 Chronic kidney disease, stage 3a: Secondary | ICD-10-CM

## 2022-11-24 DIAGNOSIS — J449 Chronic obstructive pulmonary disease, unspecified: Secondary | ICD-10-CM | POA: Diagnosis present

## 2022-11-24 DIAGNOSIS — E519 Thiamine deficiency, unspecified: Secondary | ICD-10-CM

## 2022-11-24 DIAGNOSIS — I2510 Atherosclerotic heart disease of native coronary artery without angina pectoris: Secondary | ICD-10-CM

## 2022-11-24 DIAGNOSIS — I10 Essential (primary) hypertension: Secondary | ICD-10-CM

## 2022-11-24 DIAGNOSIS — T502X5A Adverse effect of carbonic-anhydrase inhibitors, benzothiadiazides and other diuretics, initial encounter: Secondary | ICD-10-CM

## 2022-11-24 DIAGNOSIS — E118 Type 2 diabetes mellitus with unspecified complications: Secondary | ICD-10-CM

## 2022-11-24 LAB — GLUCOSE, CAPILLARY: Glucose-Capillary: 108 mg/dL — ABNORMAL HIGH (ref 70–99)

## 2022-11-24 NOTE — Progress Notes (Signed)
Victoria [name] 77 y.o. female  Pulmonary Rehab Orientation Note  This patient who was referred to Pulmonary Rehab by Dr. Marchelle Gearing with the diagnosis of COPD III arrived today in Cardiac and Pulmonary Rehab. She  arrived in wheelchair with normal gait. She  does carry portable oxygen. [employer] is the provider for their DME. Per patient, Ruberta uses oxygen continuously. Color good, skin warm and dry. Patient is oriented to time and place. [name] medical history, psychosocial health, and medications reviewed.   Psychosocial assessment reveals patient lives with alone. [name] is currently retired. Patient hobbies include watching tv, spending time with others, and spending time with church members . Patient reports her stress level is low. Areas of stress/anxiety include family . Patient does not exhibit signs of depression. Signs of depression include helplessness and fatigue. PHQ2/9 score 1/3. Artasia shows good  coping skills with positive outlook on life. Offered emotional support and reassurance. Will continue to monitor and evaluate progress toward psychosocial goal(s) of decreased stress and more energy.   Physical assessment reveals heart rate is normal, breath diminished to auscultation, no wheezes, rales, or rhonchi. Grip strength equal, strong. Distal pulses present. Pt reports non-productive chronic cough. Bowel sounds present x4 quads.  Pt denies abdominal discomfort, nausea, vomiting, diarrhea or constipation. Grip strength equal, strong. Distal pulses +2; +1 swelling to lower extremities.   [name] reports she does take medications as prescribed. Patient states she follows a regular  diet. The patient has been trying to lose weight through a healthy diet and exercise program. Pt's weight will be monitored closely. Demonstration and practice of PLB using pulse oximeter. Anessia able to return demonstration satisfactorily. Safety and hand hygiene in the exercise area reviewed with patient.  [name] voices understanding of the information reviewed. Department expectations discussed with patient and achievable goals were set. The patient shows enthusiasm about attending the program and we look forward to working with [name]. Maximina completed a 6 min walk test today and is scheduled to begin exercise on 11/30/2022 at 1015.   [id number] Essie Hart, RN, BSN

## 2022-11-24 NOTE — Progress Notes (Signed)
Baldwin [place] 77 y.o. female  Initial Psychosocial Assessment  Psychosocial assessment reveals patient lives with alone. Reagann is currently retired. Patient hobbies include watching tv, spending time with others, and spending time with church members . Patient reports her stress level is low. Areas of stress/anxiety include family . Patient does not exhibit signs of depression. Signs of depression include helplessness and fatigue. PHQ2/9 score 1/3. Shaquila shows good  coping skills with positive outlook on life. Offered emotional support and reassurance. Will continue to monitor and evaluate progress toward psychosocial goal(s) of decreased stress and more energy.   Goal(s): Improved management of stress Increased energy Improved coping skills Help patient work toward returning to meaningful activities that improve patient's QOL and are attainable with patient's lung disease   11/24/2022 12:23 PM

## 2022-11-24 NOTE — Progress Notes (Signed)
Pulmonary Individual Treatment Plan  Patient Details  Name: Victoria [name] MRN: [id number] Date of Birth: [birth year] Referring Provider:   Doristine Devoid Pulmonary Rehab Walk Test from 11/24/2022 in [facility]  Referring Provider Ramaswamy       Initial Encounter Date:  Flowsheet Row Pulmonary Rehab Walk Test from 11/24/2022 in [facility]  Date 11/24/22       Visit Diagnosis: Stage 3 severe COPD by GOLD classification (HCC)  [name] Home Medications on Admission:   Current Outpatient Medications:    acetaminophen (TYLENOL) 500 MG tablet, Take 1,000 mg by mouth every 6 (six) hours as needed., Disp: , Rfl:    albuterol (PROVENTIL) (2.5 MG/3ML) 0.083% nebulizer solution, Take 3 mLs (2.5 mg total) by nebulization every 6 (six) hours as needed for wheezing or shortness of breath., Disp: 75 mL, Rfl: 3   Albuterol Sulfate (PROAIR RESPICLICK) 108 (90 Base) MCG/ACT AEPB, Inhale 1-2 puffs into the lungs every 6 (six) hours as needed (for wheezing/shortness of breath)., Disp: 1 each, Rfl: 5   amLODipine (NORVASC) 10 MG tablet, TAKE ONE TABLET BY MOUTH ONCE DAILY, Disp: 90 tablet, Rfl: 2   Blood Glucose Monitoring Suppl (ACCU-CHEK AVIVA PLUS) w/Device KIT, 1 Device by Does not apply route daily. Use to monitor glucose levels once per day; E11.9, Disp: 1 kit, Rfl: 0   carvedilol (COREG) 6.25 MG tablet, TAKE ONE TABLET BY MOUTH EVERY MORNING and TAKE ONE TABLET BY MOUTH EVERYDAY AT BEDTIME, Disp: 180 tablet, Rfl: 2   Cholecalciferol (VITAMIN D3) 125 MCG (5000 UT) CAPS, Take 1 capsule (5,000 Units total) by mouth daily., Disp: 90 capsule, Rfl: 1   DALIRESP 500 MCG TABS tablet, TAKE ONE TABLET BY MOUTH EVERY MORNING, Disp: 90 tablet, Rfl: 3   dapagliflozin propanediol (FARXIGA) 10 MG TABS tablet, Take 5 mg by mouth daily., Disp: , Rfl:    denosumab (PROLIA) 60 MG/ML SOSY  injection, Inject 60 mg into the skin every 6 (six) months., Disp: 1 mL, Rfl: 1   Fluticasone-Umeclidin-Vilant (TRELEGY ELLIPTA) 100-62.5-25 MCG/ACT AEPB, INHALE 1 PUFF BY MOUTH INTO LUNGS DAILY, Disp: 60 each, Rfl: 5   glucose blood (ACCU-CHEK AVIVA PLUS) test strip, USE TO Check blood glucose THREE TIMES DAILY, Disp: 300 strip, Rfl: 5   glucose blood (ACCU-CHEK GUIDE) test strip, USE 1 STRIP ONCE DAILY, Disp: 100 each, Rfl: 0   glucose blood test strip, USE 1 STRIP ONCE DAILY, Disp: 100 each, Rfl: 12   methocarbamol (ROBAXIN) 500 MG tablet, Take 1 tablet (500 mg total) by mouth every 8 (eight) hours as needed for muscle spasms., Disp: 30 tablet, Rfl: 0   OXYGEN, Inhale 2-3 L into the lungs continuous. When exerting self, Disp: , Rfl:    rosuvastatin (CRESTOR) 40 MG tablet, TAKE ONE TABLET BY MOUTH EVERYDAY AT BEDTIME, Disp: 90 tablet, Rfl: 2   SitaGLIPtin-MetFORMIN HCl (JANUMET XR) 50-1000 MG TB24, Take 2 tablets by mouth daily before supper., Disp: 180 tablet, Rfl: 3   spironolactone (ALDACTONE) 25 MG tablet, TAKE ONE TABLET BY MOUTH ONCE DAILY, Disp: 90 tablet, Rfl: 2   thiamine (VITAMIN B-1) 100 MG tablet, Take 1 tablet (100 mg total) by mouth every other day., Disp: 45 tablet, Rfl: 1   Accu-Chek Softclix Lancets lancets, Use as instructed to check blood sugar 3X daily (Patient not taking: Reported on 11/24/2022), Disp: 100 each, Rfl: 1   aspirin EC 81  MG tablet, Take 1 tablet (81 mg total) by mouth daily. (Patient not taking: Reported on 11/24/2022), Disp: 90 tablet, Rfl: 3   potassium chloride SA (KLOR-CON) 20 MEQ tablet, Take 1 tablet (20 mEq total) by mouth 2 (two) times daily. (Patient not taking: Reported on 11/24/2022), Disp: 180 tablet, Rfl: 0  Past Medical History: Past Medical History:  Diagnosis Date   Abdominal aneurysm (HCC)    Dr. Darrick Penna follows lLOV 2 ''17 per pt "around 2 cm"   Anemia    as a child   Arthritis    left ankle, right knee, right SI joint, wrists, lower back    Breast cancer in female [facility])    Left   COPD (chronic obstructive pulmonary disease) (HCC)    ephysema-Dr. 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 Gerrit Friends in [place]   Sleep apnea    cpap used sometimes, uses oxygen concentrator 2 l/m nasally bedtime   Varicella as child    Tobacco Use: Social History   Tobacco Use  Smoking Status Former   Packs/day: 1.00   Years: 50.00   Additional pack years: 0.00   Total pack years: 50.00   Types: Cigarettes   Quit date: 04/16/2011   Years since quitting: 11.6  Smokeless Tobacco Never  Tobacco Comments   quit that date when she had to go to ER     Labs: Review Flowsheet  More data exists      Latest Ref Rng & Units 02/23/2021 07/22/2021 11/10/2021 03/25/2022 07/19/2022  Labs for ITP Cardiac and Pulmonary Rehab  Cholestrol 100 - 199 mg/dL - - 161  - -  LDL (calc) 0 - 99 mg/dL - - 40  - -  HDL-C >09 mg/dL - - 51  - -  Trlycerides 0 - 149 mg/dL - - 604  - -  Hemoglobin A1c 4.0 - 5.6 % 6.7  7.2  - 7.1  7.2     Capillary Blood Glucose: Lab Results  Component Value Date   GLUCAP 108 (H) 11/24/2022   GLUCAP 126 (H) 01/27/2021   GLUCAP 129 (H) 01/27/2021   GLUCAP 172 (H) 01/27/2021   GLUCAP 218 (H) 01/26/2021    POCT Glucose     Row Name 11/24/22 1141             POCT Blood Glucose   Pre-Exercise 108 mg/dL                Pulmonary Assessment Scores:  Pulmonary Assessment Scores     Row Name 11/24/22 1120         ADL UCSD   SOB Score total 68       CAT Score   CAT Score 24       mMRC Score   mMRC Score 3              UCSD: Self-administered rating of dyspnea associated with activities of daily living (ADLs) 6-point scale (0 = "not at  all" to 5 = "maximal or unable to do because of breathlessness")  Scoring Scores range from 0 to 120.  Minimally important difference is 5 units  CAT: CAT can identify the health impairment of COPD patients and is better correlated with disease progression.  CAT has a scoring range of zero to 40. The CAT score is classified into four groups of low (less than 10), medium (10 - 20), high (21-30) and very high (31-40) based on the impact level of disease on health status. A CAT score over 10 suggests significant symptoms.  A worsening CAT score could be explained by an exacerbation, poor medication adherence, poor inhaler technique, or progression of COPD or comorbid conditions.  CAT MCID is 2 points  mMRC: mMRC (Modified Medical Research Council) Dyspnea Scale is used to assess the degree of baseline functional disability in patients of respiratory disease due to dyspnea. No minimal important difference is established. A decrease in score of 1 point or greater is considered a positive change.   Pulmonary Function Assessment:  Pulmonary Function Assessment - 11/24/22 1120       Breath   Bilateral Breath Sounds Decreased    Shortness of Breath Yes;Fear of Shortness of Breath;Limiting activity             Exercise Target Goals: Exercise Program Goal: Individual exercise prescription set using results from initial 6 min walk test and THRR while considering  patient's activity barriers and safety.   Exercise Prescription Goal: Initial exercise prescription builds to 30-45 minutes a day of aerobic activity, 2-3 days per week.  Home exercise guidelines will be given to patient during program as part of exercise prescription that the participant will acknowledge.  Activity Barriers & Risk Stratification:  Activity Barriers & Cardiac Risk Stratification - 11/24/22  1059       Activity Barriers & Cardiac Risk Stratification   Activity Barriers Deconditioning;Muscular Weakness;Shortness of Breath;Joint Problems;Balance Concerns             6 Minute Walk:  6 Minute Walk     Row Name 11/24/22 1258         6 Minute Walk   Phase Initial     Distance 427 feet     Walk Time 6 minutes     # of Rest Breaks 2  stopped at 1:10, re-started at 2:30, stopped at 4:12, re-started at 5:20     MPH 0.81     METS 1.05     RPE 15     Perceived Dyspnea  3     VO2 Peak 3.67     Symptoms Yes (comment)     Comments dyspnea     Resting HR 82 bpm     Resting BP 158/78     Resting Oxygen Saturation  92 %     Exercise Oxygen Saturation  during 6 min walk 85 %     Max Ex. HR 105 bpm     Max Ex. BP 192/66     2 Minute Post BP 151/72       Interval HR   1 Minute HR 98     2 Minute HR 93     3 Minute HR 92     4 Minute HR 105     5 Minute HR 95     6 Minute HR 101     2 Minute Post HR 86     Interval Heart Rate? Yes       Interval Oxygen  Interval Oxygen? Yes     Baseline Oxygen Saturation % 92 %     1 Minute Oxygen Saturation % 91 %     1 Minute Liters of Oxygen 0 L     2 Minute Oxygen Saturation % 86 %  O2 dropped to 86% at 1:28     2 Minute Liters of Oxygen 1 L     3 Minute Oxygen Saturation % 85 %     3 Minute Liters of Oxygen 2 L     4 Minute Oxygen Saturation % 94 %     4 Minute Liters of Oxygen 2 L     5 Minute Oxygen Saturation % 97 %     5 Minute Liters of Oxygen 2 L     6 Minute Oxygen Saturation % 97 %     6 Minute Liters of Oxygen 2 L     2 Minute Post Oxygen Saturation % 99 %     2 Minute Post Liters of Oxygen 2 L              Oxygen Initial Assessment:  Oxygen Initial Assessment - 11/24/22 1105       Home Oxygen   Home Oxygen Device Portable Concentrator;Home Concentrator   back up tank if power outage   Sleep Oxygen Prescription Continuous    Liters per minute 2    Home Exercise Oxygen Prescription None    Home  Resting Oxygen Prescription Continuous    Liters per minute 2    Compliance with Home Oxygen Use Yes      Initial 6 min Walk   Oxygen Used Continuous    Liters per minute 2      Program Oxygen Prescription   Program Oxygen Prescription Continuous    Liters per minute 2      Intervention   Short Term Goals To learn and exhibit compliance with exercise, home and travel O2 prescription;To learn and understand importance of maintaining oxygen saturations>88%;To learn and demonstrate proper use of respiratory medications;To learn and understand importance of monitoring SPO2 with pulse oximeter and demonstrate accurate use of the pulse oximeter.;To learn and demonstrate proper pursed lip breathing techniques or other breathing techniques.     Long  Term Goals Verbalizes importance of monitoring SPO2 with pulse oximeter and return demonstration;Exhibits proper breathing techniques, such as pursed lip breathing or other method taught during program session;Demonstrates proper use of MDI's;Compliance with respiratory medication;Maintenance of O2 saturations>88%;Exhibits compliance with exercise, home  and travel O2 prescription             Oxygen Re-Evaluation:   Oxygen Discharge (Final Oxygen Re-Evaluation):   Initial Exercise Prescription:  Initial Exercise Prescription - 11/24/22 1200       Date of Initial Exercise RX and Referring Provider   Date 11/24/22    Referring Provider Ramaswamy    Expected Discharge Date 02/24/23      Oxygen   Oxygen Continuous    Liters 3    Maintain Oxygen Saturation 88% or higher      NuStep   Level 1    SPM 50    Minutes 30    METs 2      Prescription Details   Frequency (times per week) 2    Duration Progress to 30 minutes of continuous aerobic without signs/symptoms of physical distress      Intensity   THRR 40-80% of Max Heartrate 57-114    Ratings of Perceived Exertion 11-13  Perceived Dyspnea 0-4      Progression   Progression  Continue progressive overload as per policy without signs/symptoms or physical distress.      Resistance Training   Training Prescription Yes    Weight red bands    Reps 10-15             Perform Capillary Blood Glucose checks as needed.  Exercise Prescription Changes:   Exercise Comments:   Exercise Goals and Review:   Exercise Goals     Row Name 11/24/22 1103             Exercise Goals   Increase Physical Activity Yes       Intervention Provide advice, education, support and counseling about physical activity/exercise needs.;Develop an individualized exercise prescription for aerobic and resistive training based on initial evaluation findings, risk stratification, comorbidities and participant's personal goals.       Expected Outcomes Short Term: Attend rehab on a regular basis to increase amount of physical activity.;Long Term: Exercising regularly at least 3-5 days a week.;Long Term: Add in home exercise to make exercise part of routine and to increase amount of physical activity.       Increase Strength and Stamina Yes       Intervention Provide advice, education, support and counseling about physical activity/exercise needs.;Develop an individualized exercise prescription for aerobic and resistive training based on initial evaluation findings, risk stratification, comorbidities and participant's personal goals.       Expected Outcomes Short Term: Increase workloads from initial exercise prescription for resistance, speed, and METs.;Short Term: Perform resistance training exercises routinely during rehab and add in resistance training at home;Long Term: Improve cardiorespiratory fitness, muscular endurance and strength as measured by increased METs and functional capacity ( )       Able to understand and use rate of perceived exertion (RPE) scale Yes       Intervention Provide education and explanation on how to use RPE scale       Expected Outcomes Short Term: Able to  use RPE daily in rehab to express subjective intensity level;Long Term:  Able to use RPE to guide intensity level when exercising independently       Able to understand and use Dyspnea scale Yes       Intervention Provide education and explanation on how to use Dyspnea scale       Expected Outcomes Short Term: Able to use Dyspnea scale daily in rehab to express subjective sense of shortness of breath during exertion;Long Term: Able to use Dyspnea scale to guide intensity level when exercising independently       Knowledge and understanding of Target Heart Rate Range (THRR) Yes       Intervention Provide education and explanation of THRR including how the numbers were predicted and where they are located for reference       Expected Outcomes Short Term: Able to state/look up THRR;Short Term: Able to use daily as guideline for intensity in rehab;Long Term: Able to use THRR to govern intensity when exercising independently       Understanding of Exercise Prescription Yes       Intervention Provide education, explanation, and written materials on patient's individual exercise prescription       Expected Outcomes Short Term: Able to explain program exercise prescription;Long Term: Able to explain home exercise prescription to exercise independently                Exercise Goals Re-Evaluation :   Discharge  Exercise Prescription (Final Exercise Prescription Changes):   Nutrition:  Target Goals: Understanding of nutrition guidelines, daily intake of sodium 1500mg , cholesterol 200mg , calories 30% from fat and 7% or less from saturated fats, daily to have 5 or more servings of fruits and vegetables.  Biometrics:    Nutrition Therapy Plan and Nutrition Goals:   Nutrition Assessments:  MEDIFICTS Score Key: ?70 Need to make dietary changes  40-70 Heart Healthy Diet ? 40 Therapeutic Level Cholesterol Diet   Picture Your Plate Scores: <84 Unhealthy dietary pattern with much room for  improvement. 41-50 Dietary pattern unlikely to meet recommendations for good health and room for improvement. 51-60 More healthful dietary pattern, with some room for improvement.  >60 Healthy dietary pattern, although there may be some specific behaviors that could be improved.    Nutrition Goals Re-Evaluation:   Nutrition Goals Discharge (Final Nutrition Goals Re-Evaluation):   Psychosocial: Target Goals: Acknowledge presence or absence of significant depression and/or stress, maximize coping skills, provide positive support system. Participant is able to verbalize types and ability to use techniques and skills needed for reducing stress and depression.  Initial Review & Psychosocial Screening:  Initial Psych Review & Screening - 11/24/22 1149       Initial Review   Current issues with Current Stress Concerns    Source of Stress Concerns Family    Comments One brother has dementia, other brother type 1 diabetes. Both have been recently ill.      Family Dynamics   Good Support System? Yes    Comments Pt has supportive son and daughter-in-law & many church members      Barriers   Psychosocial barriers to participate in program The patient should benefit from training in stress management and relaxation.      Screening Interventions   Interventions Encouraged to exercise    Expected Outcomes Long Term Goal: Stressors or current issues are controlled or eliminated.;Long Term goal: The participant improves quality of Life and PHQ9 Scores as seen by post scores and/or verbalization of changes             Quality of Life Scores:  Scores of 19 and below usually indicate a poorer quality of life in these areas.  A difference of  2-3 points is a clinically meaningful difference.  A difference of 2-3 points in the total score of the Quality of Life Index has been associated with significant improvement in overall quality of life, self-image, physical symptoms, and general health in  studies assessing change in quality of life.  PHQ-9: Review Flowsheet  More data exists      11/24/2022 08/19/2022 06/02/2022 05/29/2021 02/20/2020  Depression screen PHQ 2/9  Decreased Interest 1 0 0 0 0  Down, Depressed, Hopeless 0 0 0 0 1  PHQ - 2 Score 1 0 0 0 1  Altered sleeping 0 - - - -  Tired, decreased energy 1 - - - -  Change in appetite 0 - - - -  Feeling bad or failure about yourself  0 - - - -  Trouble concentrating 0 - - - -  Moving slowly or fidgety/restless 1 - - - -  Suicidal thoughts 0 - - - -  PHQ-9 Score 3 - - - -  Difficult doing work/chores Not difficult at all - - - -   Interpretation of Total Score  Total Score Depression Severity:  1-4 = Minimal depression, 5-9 = Mild depression, 10-14 = Moderate depression, 15-19 = Moderately severe depression,  20-27 = Severe depression   Psychosocial Evaluation and Intervention:  Psychosocial Evaluation - 11/24/22 1149       Psychosocial Evaluation & Interventions   Interventions Encouraged to exercise with the program and follow exercise prescription;Stress management education    Comments To decrease stress related to family illnesses    Expected Outcomes For Jamoni to participate in PR without any psychosocial barriers or concerns    Continue Psychosocial Services  Follow up required by staff             Psychosocial Re-Evaluation:   Psychosocial Discharge (Final Psychosocial Re-Evaluation):   Education: Education Goals: Education classes will be provided on a weekly basis, covering required topics. Participant will state understanding/return demonstration of topics presented.  Learning Barriers/Preferences:  Learning Barriers/Preferences - 11/24/22 1131       Learning Barriers/Preferences   Learning Barriers Sight;Exercise Concerns    Learning Preferences Group Instruction;Individual Instruction;Written Material;Verbal Instruction             Education Topics: Introduction to Pulmonary  Rehab Group instruction provided by PowerPoint, verbal discussion, and written material to support subject matter. Instructor reviews what Pulmonary Rehab is, the purpose of the program, and how patients are referred.     Know Your Numbers Group instruction that is supported by a PowerPoint presentation. Instructor discusses importance of knowing and understanding resting, exercise, and post-exercise oxygen saturation, heart rate, and blood pressure. Oxygen saturation, heart rate, blood pressure, rating of perceived exertion, and dyspnea are reviewed along with a normal range for these values.    Exercise for the Pulmonary Patient Group instruction that is supported by a PowerPoint presentation. Instructor discusses benefits of exercise, core components of exercise, frequency, duration, and intensity of an exercise routine, importance of utilizing pulse oximetry during exercise, safety while exercising, and options of places to exercise outside of rehab.     MET Level  Group instruction provided by PowerPoint, verbal discussion, and written material to support subject matter. Instructor reviews what METs are and how to increase METs.    Pulmonary Medications Verbally interactive group education provided by instructor with focus on inhaled medications and proper administration.   Anatomy and Physiology of the Respiratory System Group instruction provided by PowerPoint, verbal discussion, and written material to support subject matter. Instructor reviews respiratory cycle and anatomical components of the respiratory system and their functions. Instructor also reviews differences in obstructive and restrictive respiratory diseases with examples of each.    Oxygen Safety Group instruction provided by PowerPoint, verbal discussion, and written material to support subject matter. There is an overview of "What is Oxygen" and "Why do we need it".  Instructor also reviews how to create a safe  environment for oxygen use, the importance of using oxygen as prescribed, and the risks of noncompliance. There is a brief discussion on traveling with oxygen and resources the patient may utilize.   Oxygen Use Group instruction provided by PowerPoint, verbal discussion, and written material to discuss how supplemental oxygen is prescribed and different types of oxygen supply systems. Resources for more information are provided.    Breathing Techniques Group instruction that is supported by demonstration and informational handouts. Instructor discusses the benefits of pursed lip and diaphragmatic breathing and detailed demonstration on how to perform both.     Risk Factor Reduction Group instruction that is supported by a PowerPoint presentation. Instructor discusses the definition of a risk factor, different risk factors for pulmonary disease, and how the heart and lungs work together.  MD Day A group question and answer session with a medical doctor that allows participants to ask questions that relate to their pulmonary disease state.   Nutrition for the Pulmonary Patient Group instruction provided by PowerPoint slides, verbal discussion, and written materials to support subject matter. The instructor gives an explanation and review of healthy diet recommendations, which includes a discussion on weight management, recommendations for fruit and vegetable consumption, as well as protein, fluid, caffeine, fiber, sodium, sugar, and alcohol. Tips for eating when patients are short of breath are discussed. Flowsheet Row PULMONARY REHAB CHRONIC OBSTRUCTIVE PULMONARY DISEASE from 04/22/2016 in [facility]  Date 03/25/16  [facility] Eating During the Holiday]  Educator RD  Instruction Review Code (Retired) 2- meets goals/outcomes        Other Education Group or individual verbal, written, or video instructions that support the  educational goals of the pulmonary rehab program.    Knowledge Questionnaire Score:  Knowledge Questionnaire Score - 11/24/22 1358       Knowledge Questionnaire Score   Pre Score 14/18             Core Components/Risk Factors/Patient Goals at Admission:  Personal Goals and Risk Factors at Admission - 11/24/22 1150       Core Components/Risk Factors/Patient Goals on Admission    Weight Management Yes    Intervention Weight Management: Develop a combined nutrition and exercise program designed to reach desired caloric intake, while maintaining appropriate intake of nutrient and fiber, sodium and fats, and appropriate energy expenditure required for the weight goal.;Weight Management: Provide education and appropriate resources to help participant work on and attain dietary goals.;Weight Management/Obesity: Establish reasonable short term and long term weight goals.;Obesity: Provide education and appropriate resources to help participant work on and attain dietary goals.    Expected Outcomes Short Term: Continue to assess and modify interventions until short term weight is achieved;Long Term: Adherence to nutrition and physical activity/exercise program aimed toward attainment of established weight goal;Weight Loss: Understanding of general recommendations for a balanced deficit meal plan, which promotes 1-2 lb weight loss per week and includes a negative energy balance of [phone] kcal/d;Understanding recommendations for meals to include 15-35% energy as protein, 25-35% energy from fat, 35-60% energy from carbohydrates, less than 200mg  of dietary cholesterol, 20-35 gm of total fiber daily;Understanding of distribution of calorie intake throughout the day with the consumption of 4-5 meals/snacks    Improve shortness of breath with ADL's Yes    Intervention Provide education, individualized exercise plan and daily activity instruction to help decrease symptoms of SOB with activities of daily  living.    Expected Outcomes Short Term: Improve cardiorespiratory fitness to achieve a reduction of symptoms when performing ADLs;Long Term: Be able to perform more ADLs without symptoms or delay the onset of symptoms    Increase knowledge of respiratory medications and ability to use respiratory devices properly  Yes    Intervention Provide education and demonstration as needed of appropriate use of medications, inhalers, and oxygen therapy.    Expected Outcomes Short Term: Achieves understanding of medications use. Understands that oxygen is a medication prescribed by physician. Demonstrates appropriate use of inhaler and oxygen therapy.;Long Term: Maintain appropriate use of medications, inhalers, and oxygen therapy.             Core Components/Risk Factors/Patient Goals Review:    Core Components/Risk Factors/Patient Goals at Discharge (Final Review):    ITP Comments:   Comments:  Dr. Mechele Collin is Medical Director for Pulmonary Re[facility] at [facility].

## 2022-11-30 ENCOUNTER — Encounter (HOSPITAL_COMMUNITY)
Admission: RE | Admit: 2022-11-30 | Discharge: 2022-11-30 | Disposition: A | Discharge status: Home / Self Care | Payer: 59 | Source: Ambulatory Visit | Attending: Internal Medicine | Admitting: Internal Medicine

## 2022-11-30 ENCOUNTER — Other Ambulatory Visit (HOSPITAL_COMMUNITY): Payer: Self-pay

## 2022-11-30 ENCOUNTER — Other Ambulatory Visit: Payer: Self-pay

## 2022-12-02 ENCOUNTER — Encounter (HOSPITAL_COMMUNITY): Payer: 59

## 2022-12-02 DIAGNOSIS — J449 Chronic obstructive pulmonary disease, unspecified: Secondary | ICD-10-CM | POA: Diagnosis not present

## 2022-12-02 LAB — GLUCOSE, CAPILLARY
Glucose-Capillary: 131 mg/dL — ABNORMAL HIGH (ref 70–99)
Glucose-Capillary: 152 mg/dL — ABNORMAL HIGH (ref 70–99)

## 2022-12-02 NOTE — Progress Notes (Signed)
Daily Session Note  Patient Details  Name: Victoria [name] MRN: [id number] Date of Birth: 1945-12-21 Referring Provider:   Doristine Devoid Pulmonary Rehab Walk Test from 11/24/2022 in [facility], V[facility], [facility]  Referring Provider Ramaswamy       Encounter Date: 12/02/2022  Check In:  Session Check In - 12/02/22 1030       Check-In   Supervising physician immediately available to respond to emergencies [facility] MD immediately available    Physician(s) Bernadene Person, NP    Location [facility]    Staff Present Samantha Belarus, RD, Dutch Gray, RN, BSN;Randi Reeve BS, ACSM-CEP, Exercise Physiologist;Jaquarius Seder Earlene Plater, MS, ACSM-CEP, Exercise Physiologist;Casey Katrinka Blazing, RT    Virtual Visit No    Medication changes reported     No    Fall or balance concerns reported    No    Tobacco Cessation No Change    Warm-up and Cool-down Performed as group-led instruction    Resistance Training Performed Yes    VAD Patient? No    PAD/SET Patient? No      Pain Assessment   Currently in Pain? No/denies    Multiple Pain Sites No             Capillary Blood Glucose: Results for orders placed or performed during the hospital encounter of 12/02/22 (from the past 24 hour(s))  Glucose, capillary     Status: Abnormal   Collection Time: 12/02/22 10:23 AM  Result Value Ref Range   Glucose-Capillary 131 (H) 70 - 99 mg/dL   *Note: Due to a large number of results and/or encounters for the requested time period, some results have not been displayed. A complete set of results can be found in Results Review.      Social History   Tobacco Use  Smoking Status Former   Current packs/day: 0.00   Average packs/day: 1 pack/day for 50.0 years (50.0 ttl pk-yrs)   Types: Cigarettes   Start date: 04/15/1961   Quit date: 04/16/2011   Years since quitting: 11.6  Smokeless Tobacco Never  Tobacco Comments   quit that date when she had to go to  ER     Goals Met:  Proper associated with RPD/PD & O2 Sat No report of concerns or symptoms today Strength training completed today  Goals Unmet:  Not Applicable  Comments: Service time is from 1019 to 1200.    Dr. Mechele Collin is Medical Director for Pulmonary Rehab at [facility].

## 2022-12-07 ENCOUNTER — Encounter (HOSPITAL_COMMUNITY)
Admission: RE | Admit: 2022-12-07 | Discharge: 2022-12-07 | Disposition: A | Discharge status: Home / Self Care | Payer: 59 | Source: Ambulatory Visit | Attending: Internal Medicine | Admitting: Internal Medicine

## 2022-12-07 VITALS — Wt 198.6 lb

## 2022-12-07 DIAGNOSIS — J449 Chronic obstructive pulmonary disease, unspecified: Secondary | ICD-10-CM

## 2022-12-07 LAB — GLUCOSE, CAPILLARY
Glucose-Capillary: 125 mg/dL — ABNORMAL HIGH (ref 70–99)
Glucose-Capillary: 111 mg/dL — ABNORMAL HIGH (ref 70–99)

## 2022-12-07 NOTE — Progress Notes (Signed)
Daily Session Note  Patient Details  Name: Victoria [name] MRN: [id number] Date of Birth: 07/01/1945 Referring Provider:   Doristine Devoid Pulmonary Rehab Walk Test from 11/24/2022 in [facility] for Heart, Vascular, & Lung Health  Referring Provider Ramaswamy       Encounter Date: 12/07/2022  Check In:  Session Check In - 12/07/22 0950       Check-In   Supervising physician immediately available to respond to emergencies [facility] immediately available    Physician(s) Bernadene Person, NP    [facility]    Staff Present Essie Hart, RN, Doris Cheadle, MS, ACSM-CEP, Exercise Physiologist;Casey Katrinka Blazing, RT    Virtual Visit No    Medication changes reported     No    Fall or balance concerns reported    No    Tobacco Cessation No Change    Warm-up and Cool-down Performed as group-led instruction    Resistance Training Performed Yes    VAD Patient? No    PAD/SET Patient? No      Pain Assessment   Currently in Pain? No/denies    Multiple Pain Sites No             Capillary Blood Glucose: Results for orders placed or performed during the hospital encounter of 12/07/22 (from the past 24 hour(s))  Glucose, capillary     Status: Abnormal   Collection Time: 12/07/22 10:32 AM  Result Value Ref Range   Glucose-Capillary 125 (H) 70 - 99 mg/dL  Glucose, capillary     Status: Abnormal   Collection Time: 12/07/22 11:54 AM  Result Value Ref Range   Glucose-Capillary 111 (H) 70 - 99 mg/dL   *Note: Due to a large number of results and/or encounters for the requested time period, some results have not been displayed. A complete set of results can be found in Results Review.     Exercise Prescription Changes - 12/07/22 1200       Response to Exercise   Blood Pressure (Admit) 144/68    Blood Pressure (Exercise) 130/68    Blood Pressure (Exit) 158/68    Heart Rate (Admit) 84 bpm    Heart Rate (Exercise) 85 bpm    Heart Rate (Exit)  86 bpm    Oxygen Saturation (Admit) 98 %    Oxygen Saturation (Exercise) 97 %    Oxygen Saturation (Exit) 97 %    Rating of Perceived Exertion (Exercise) 11    Perceived Dyspnea (Exercise) 1    Duration Progress to 30 minutes of  aerobic without signs/symptoms of physical distress    Intensity THRR unchanged      Progression   Progression Continue to progress workloads to maintain intensity without signs/symptoms of physical distress.      Resistance Training   Training Prescription Yes    Weight red bands    Reps 10-15    Time 10 Minutes      Oxygen   Oxygen Continuous    Liters 3      NuStep   Level 1    Minutes 30    METs 1.5      Oxygen   Maintain Oxygen Saturation 88% or higher             Social History   Tobacco Use  Smoking Status Former   Current packs/day: 0.00   Average packs/day: 1 pack/day for 50.0 years (50.0 ttl pk-yrs)   Types: Cigarettes   Start date: 04/15/1961  Quit date: 04/16/2011   Years since quitting: 11.6  Smokeless Tobacco Never  Tobacco Comments   quit that date when she had to go to ER     Goals Met:  Proper associated with RPD/PD & O2 Sat Exercise tolerated well No report of concerns or symptoms today Strength training completed today  Goals Unmet:  Not Applicable  Comments: Service time is from 1019 to 1145.    Dr. Mechele Collin is Medical Director for Pulmonary Rehab at [facility].

## 2022-12-09 ENCOUNTER — Other Ambulatory Visit (HOSPITAL_COMMUNITY): Payer: Self-pay

## 2022-12-09 ENCOUNTER — Encounter (HOSPITAL_COMMUNITY)
Admission: RE | Admit: 2022-12-09 | Discharge: 2022-12-09 | Disposition: A | Discharge status: Home / Self Care | Payer: 59 | Source: Ambulatory Visit | Attending: Internal Medicine | Admitting: Internal Medicine

## 2022-12-09 DIAGNOSIS — J449 Chronic obstructive pulmonary disease, unspecified: Secondary | ICD-10-CM | POA: Diagnosis not present

## 2022-12-09 NOTE — Progress Notes (Signed)
Daily Session Note  Patient Details  Name: Victoria [name] MRN: [id number] Date of Birth: 09/14/1945 Referring Provider:   Doristine Devoid Pulmonary Rehab Walk Test from 11/24/2022 in [facility] Heart, Vascular, & Lung Health  Referring Provider Ramaswamy       Encounter Date: 12/09/2022  Check In:  Session Check In - 12/09/22 1139       Check-In   Supervising physician immediately available to respond to emergencies [facility] immediately available    Physician(s) Eligha Bridegroom, NP    [facility]    Staff Present Raford Pitcher, MS, ACSM-CEP, Exercise Physiologist;Serafin Decatur Katrinka Blazing, RT;Jetta Dan Humphreys BS, ACSM-CEP, Exercise Physiologist;Carlette Les Pou, RN, BSN    Virtual Visit No    Medication changes reported     No    Fall or balance concerns reported    No    Tobacco Cessation No Change    Warm-up and Cool-down Performed as group-led instruction    Resistance Training Performed Yes    VAD Patient? No    PAD/SET Patient? No      Pain Assessment   Currently in Pain? No/denies    Pain Score 0-No pain    Multiple Pain Sites No             Capillary Blood Glucose: No results found. However, due to the size of the patient record, not all encounters were searched. Please check Results Review for a complete set of results.    Social History   Tobacco Use  Smoking Status Former   Current packs/day: 0.00   Average packs/day: 1 pack/day for 50.0 years (50.0 ttl pk-yrs)   Types: Cigarettes   Start date: 04/15/1961   Quit date: 04/16/2011   Years since quitting: 11.6  Smokeless Tobacco Never  Tobacco Comments   quit that date when she had to go to ER     Goals Met:  Proper associated with RPD/PD & O2 Sat Independence with exercise equipment Exercise tolerated well No report of concerns or symptoms today Strength training completed today  Goals Unmet:  Not Applicable  Comments: Service time is from 1002 to  1141.    Dr. Mechele Collin is Medical Director for Pulmonary Rehab at [facility].

## 2022-12-14 ENCOUNTER — Encounter (HOSPITAL_COMMUNITY)
Admission: RE | Admit: 2022-12-14 | Discharge: 2022-12-14 | Disposition: A | Discharge status: Home / Self Care | Payer: 59 | Source: Ambulatory Visit | Attending: Internal Medicine | Admitting: Internal Medicine

## 2022-12-14 DIAGNOSIS — J449 Chronic obstructive pulmonary disease, unspecified: Secondary | ICD-10-CM

## 2022-12-14 NOTE — Progress Notes (Signed)
Daily Session Note  Patient Details  Name: Victoria [name] MRN: [id number] Date of Birth: 18-Sep-1945 Referring Provider:   Doristine Devoid Pulmonary Rehab Walk Test from 11/24/2022 in [facility] for Heart, Vascular, & Lung Health  Referring Provider Ramaswamy       Encounter Date: 12/14/2022  Check In:  Session Check In - 12/14/22 1211       Check-In   Supervising physician immediately available to respond to emergencies [facility] MD immediately available    Physician(s) Robin Searing, NP    [facility]    Staff Present Elissa Lovett BS, ACSM-CEP, Exercise Physiologist;Samantha Belarus, RD, Dutch Gray, RN, BSN;Carlette Les Pou, RN, Fuller Plan, RT    Virtual Visit No    Medication changes reported     No    Fall or balance concerns reported    No    Tobacco Cessation No Change    Warm-up and Cool-down Performed as group-led instruction    Resistance Training Performed Yes    VAD Patient? No    PAD/SET Patient? No      Pain Assessment   Currently in Pain? No/denies    Multiple Pain Sites No             Capillary Blood Glucose: No results found. However, due to the size of the patient record, not all encounters were searched. Please check Results Review for a complete set of results.    Social History   Tobacco Use  Smoking Status Former   Current packs/day: 0.00   Average packs/day: 1 pack/day for 50.0 years (50.0 ttl pk-yrs)   Types: Cigarettes   Start date: 04/15/1961   Quit date: 04/16/2011   Years since quitting: 11.6  Smokeless Tobacco Never  Tobacco Comments   quit that date when she had to go to ER     Goals Met:  Proper associated with RPD/PD & O2 Sat Independence with exercise equipment Exercise tolerated well No report of concerns or symptoms today Strength training completed today  Goals Unmet:  Not Applicable  Comments: Service time is from 1019 to 1150.    Dr. Mechele Collin is  Medical Director for Pulmonary Rehab at [facility].

## 2022-12-15 ENCOUNTER — Other Ambulatory Visit: Payer: Self-pay | Admitting: Nurse Practitioner

## 2022-12-15 ENCOUNTER — Encounter: Payer: Self-pay | Admitting: Nurse Practitioner

## 2022-12-15 DIAGNOSIS — M810 Age-related osteoporosis without current pathological fracture: Secondary | ICD-10-CM

## 2022-12-16 ENCOUNTER — Ambulatory Visit: Payer: 59 | Admitting: Internal Medicine

## 2022-12-16 ENCOUNTER — Encounter (HOSPITAL_COMMUNITY)
Admission: RE | Admit: 2022-12-16 | Discharge: 2022-12-16 | Disposition: A | Discharge status: Home / Self Care | Payer: 59 | Source: Ambulatory Visit | Attending: Internal Medicine | Admitting: Internal Medicine

## 2022-12-16 DIAGNOSIS — J439 Emphysema, unspecified: Secondary | ICD-10-CM | POA: Insufficient documentation

## 2022-12-16 DIAGNOSIS — J449 Chronic obstructive pulmonary disease, unspecified: Secondary | ICD-10-CM | POA: Insufficient documentation

## 2022-12-16 DIAGNOSIS — Z5189 Encounter for other specified aftercare: Secondary | ICD-10-CM | POA: Insufficient documentation

## 2022-12-16 DIAGNOSIS — Z87891 Personal history of nicotine dependence: Secondary | ICD-10-CM | POA: Insufficient documentation

## 2022-12-16 NOTE — Progress Notes (Signed)
Daily Session Note  Patient Details  Name: Victoria [name] MRN: [id number] Date of Birth: Feb 01, 1946 Referring Provider:   Doristine Devoid Pulmonary Rehab Walk Test from 11/24/2022 in [facility] for Heart, Vascular, & Lung Health  Referring Provider Ramaswamy       Encounter Date: 12/16/2022  Check In:   Capillary Blood Glucose: No results found. However, due to the size of the patient record, not all encounters were searched. Please check Results Review for a complete set of results.    Social History   Tobacco Use  Smoking Status Former   Current packs/day: 0.00   Average packs/day: 1 pack/day for 50.0 years (50.0 ttl pk-yrs)   Types: Cigarettes   Start date: 04/15/1961   Quit date: 04/16/2011   Years since quitting: 11.6  Smokeless Tobacco Never  Tobacco Comments   quit that date when she had to go to ER     Goals Met:  Proper associated with RPD/PD & O2 Sat Independence with exercise equipment Exercise tolerated well No report of concerns or symptoms today Strength training completed today  Goals Unmet:  Not Applicable  Comments: Service time is from 1011 to 1150.    Dr. Mechele Collin is Medical Director for Pulmonary Rehab at [facility].

## 2022-12-17 ENCOUNTER — Ambulatory Visit
Admission: RE | Admit: 2022-12-17 | Discharge: 2022-12-17 | Disposition: A | Discharge status: Home / Self Care | Payer: 59 | Source: Ambulatory Visit | Attending: Hematology and Oncology | Admitting: Hematology and Oncology

## 2022-12-17 ENCOUNTER — Ambulatory Visit
Admission: RE | Admit: 2022-12-17 | Discharge: 2022-12-17 | Disposition: A | Discharge status: Home / Self Care | Payer: 59 | Source: Ambulatory Visit | Attending: Internal Medicine | Admitting: Internal Medicine

## 2022-12-17 DIAGNOSIS — C50412 Malignant neoplasm of upper-outer quadrant of left female breast: Secondary | ICD-10-CM

## 2022-12-19 NOTE — Patient Instructions (Incomplete)
COPD, severe (HCC) Chronic respiratory failure with hypoxia (HCC)  -Stable disease  with PFT stable since 2017  Plan -Continue oxygen, Trelegy and Daliresp as before. -  support motorized scooter/wheel chair  due to copd, obesity, OA  - I do not have resources r skill set to do detailed mobility assessment that this might require - keep  - keep  [facility] for [facility] valve evaluation   Cancer screening  -No evidence of lung cancer in August 2020 low-dose CT scan of the chest and feb 2022 ad mar 2023  Plan -Repeat CT scan of the chest June 2024  - get details of appointment from [facility] 10/05/2022    Follow-up -3-4 months with Dr Marchelle Gearing

## 2022-12-19 NOTE — Progress Notes (Signed)
OV 07/20/2017  Chief Complaint  Patient presents with   Follow-up    Follow up visit. Pt also needing surgical clearance for lumpectomy.  Pt saw RB beginning February annd was givien prednisone. Pt states breathing has been tolerable with not taking prednisone. DME: [employer], 2L continuous at home 3L pulse when out     Follow-up severe COPD patient with obesity and sleep apnea.  She says she is compliant with all her COPD inhalers.  She is also compliant with her CPAP.  She is not smoking anymore.  Overall COPD CAT score is 16.  She uses oxygen all the time and CPAP at night.  She is due for breast surgery lumpectomy and is here for a preoperative evaluation.  She tells me that she is in good nutritional status.  She is not anemic.  She has got good kidney function.  She says the surgery will be elective and less than 2 hours and does not involve operating on the neck or the chest or the abdomen.  It will be done at [facility] and under experience surgeon.  Overall she is very functional and active despite his severe COPD.   OV 09/14/2017  Chief Complaint  Patient presents with   Follow-up    Pt states she has been doing better since her visit with Dr. Craige Cotta last week. Pt is currently taking an abx and just finished last dose of prednisone today. Pt's cough has become better and is still wheezing some. SOB is about the same.    Follow-up advanced COPD with obesity and sleep apnea and chronic hypoxemic respiratory failure  I last saw her in March 2019 which itself followed in exacerbation.  Since then she has had 2 visits in my office with 2 providers for COPD exacerbation.  Most recently September 09, 2017.  Chest x-ray reviewed but did not visualize and the x-ray is clear.  [Of note she had low-dose CT scan of the chest May 2018 and is due for one-year CT scan coming up anytime now] her baseline COPD CAT scan.  She is using oxygen between 2-3 L.  She feels her exacerbation is slowly  resolving but she is still symptomatic COPD CAT score is 24 and much worse than baseline although this is improved compared to last week.  She did have breast cancer surgery recently and this went well.  Currently breast cancer in remission.  Review of her medication shows that while she was in the hospital nebulized bronchodilator long-acting beta agonist was tried and she really liked it.  Therefore on September 09, 2017 visit her combination inhaler Stiolto was changed to nebulizer [place] she is here to start this.and Yupelri.  Review of her chart shows that she is not on inhaled corticosteroid.  Sometime back she was on Arnuity but she is not taking it due to lack of perceived benefit.  But now she is having recurrent exacerbations.  Therefore she is open to starting it again     OV 02/09/2018  Subjective:  Patient ID: Victoria [name], female , DOB: 1945-06-21 , age 70 y.o. , MRN: [id number] , ADDRESS: [address] [place] [address]   02/09/2018 -   Chief Complaint  Patient presents with   Follow-up    Pt states she has been okay since last visit. States she is still having problems becoming SOB when she exerts herself and has occ cough when she has the SOB. Denies any complaints of wheezing  or CP. Pt does develop chest tightness when she has problems with SOB.     HPI Victoria [name] 77 y.o. -with advanced COPD and chronic hypoxemic respiratory failure.  Presents for routine follow-up.  Few weeks ago she had an exacerbation recall an antibiotic and prednisone.  After that she is improved back to her baseline with COPD CAT score shown below 15 and the symptomatology described.  She has new onset of pedal edema for the last 3 days.  This is associated with increased dietary intake of soda and also salted chips.  There is no fever or rash or wheezing.  Her COPD medications include nebulizers.  She was changed to the sometime back by nurse practitioner.  She is asking about going back to inhaler  particularly Stiolto.  She is willing to try Trelegy         OV 07/20/2018  Subjective:  Patient ID: Victoria [name], female , DOB: [birth year] , age 62 y.o. , MRN: [id number] , ADDRESS: [address] [place] [address]   07/20/2018 -   Chief Complaint  Patient presents with   Follow-up    Pt states her breathing has become worse since last visit, has an occ dry cough, and also has some tightness in chest.   Follow-up) COPD with chronic hypoxemic respiratory failure.  On Trelegy, oxygen 2 L and Daliresp.  HPI Victoria [name] 77 y.o. -presents for routine follow-up.  Since last seeing me he saw a nurse practitioner for follow-up.  She tells me for the last 3 days she is has increased shortness of breath and increased chest tightness and wheezing but no increase in cough or sputum production.  This fits in with a slight increase in COPD CAT score to 18.  She takes 2 L of oxygen at home and 3 L with a portable system.  She continues on Daliresp without problems and also Trelegy inhaler.  Viral exposure or travel history -   Is negative.  Last CT scan of the chest was in June 2019 showed only emphysema.  This was for lung cancer screening.  Last pulmonary function test was in 2017.  Most recent blood work reviewed was in October 2019 and normal.   Also c/p bad knees and morning stiffness and arthralgia/arthritis for long tme. No dx opf RA     OV 01/17/2019  Subjective:  Patient ID: Victoria [name], female , DOB: 23-Sep-1945 , age 26 y.o. , MRN: [id number] , ADDRESS: [address] [place] [address]  Follow-up) COPD with chronic hypoxemic respiratory failure.  On Trelegy, oxygen 2 L and Daliresp.  01/17/2019 -  Copd followup   HPI Victoria [name] 77 y.o. -returns for follow-up of advanced COPD.  Overall she is stable.  She is doing good with social distancing.  She has not had any COPD exacerbations.  She is having some dryness of the nose with the oxygen for the last few  days and this is causing burning and irritation.  She tried some saline nasal spray yesterday and that seems to help.  She had a low-dose CT scan for lung cancer screening and her lung nodules are stable.  Repeat CT scan is recommended in 1 year.  She is compliant with her oxygen, Trelegy and Daliresp.  This combination is helping her.  COPD CAT score is 16.  She is having some economic difficulties because of the pandemic but otherwise is doing okay.    IMPRESSION: 1. Lung-RADS 2, benign  appearance or behavior. Continue annual screening with low-dose chest CT without contrast in 12 months. 2. Aortic atherosclerosis (ICD10-I70.0), coronary artery atherosclerosis and emphysema (ICD10-J43.9). 3. Similar left breast nodule of 8 mm. The patient had a negative left-sided diagnostic mammogram on 12/04/2018. 4. Similar left adrenal adenomas.     Electronically Signed   By: Jeronimo Greaves M.D.   On: 01/10/2019 14:34     OV 07/25/2019  Subjective:  Patient ID: Victoria [name], female , DOB: [birth year] , age 28 y.o. , MRN: [id number] , ADDRESS: [address] [place] [address]   07/25/2019 -   Chief Complaint  Patient presents with   Follow-up    Pt states she has been doing okay since last visit. Pt states she still becomes SOB with activities but states breathing is stable.   Follow-up) COPD with chronic hypoxemic respiratory failure.  On Trelegy, oxygen 2 L and Daliresp.  HPI Victoria [name] 77 y.o. -presents for follow-up of her COPD and chronic hypoxic respiratory failure.  She tells me that recently she has been forgetting to use her oxygen while at rest at home a few hours later when she checks her pulse ox is 93%.  She has been surprised by how good this is.  Overall COPD stable.  When she exerts herself she feels she needs 3 L because she gets short of breath although she is not checked her pulse ox with exertion.  She is on Trelegy and Daliresp and oxygen 24/7.  Cancer  screening: Last CT scan of the chest August 2020 and this was without any evidence of lung cancer.  Follow-up in 1 year is recommended.  Out of work: I did a letter for her on the assumption she wanted to go back to work.  However she tells me that she is very afraid to go back to work.  This is because she feels she will get COVID-19 at work.  So she wants me to redo this letter.  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  At this point in time patient is vaccinated but her client is not vaccinated fully.  Declined second dose is pending at the end of this month 2021.  There are other people at work or not vaccinated.  She has managed to avoid getting Covid by staying within her safety bubble, social distancing and wearing a mask.  CDC guidelines yesterday indicate that vaccinated people can gather indoors with other vaccinated people without a mask.  In my interpretation of the CDC guidelines it appears this is more relevant to family situation as opposed to work.  She has advanced COPD and she could be at an extremely high risk of death if she were to get COVID-19.  Therefore I would support her request to stay off work for a few more months till further guidelines from [facility] regarding work-up delineated and also more people in the community are vaccinated.  Noted: She has not been to work in a year.   CAT Score 07/25/2019 09/14/2017  Total CAT Score 12 24     ROS - per HPI  04/30/2020 Follow up : COPD , O2 RF , OSA   77 year old female former smoker followed for severe gold 3 COPD and oxygen pendant respiratory failure, obstructive sleep apnea nocturnal CPAP Breast cancer history diagnosed in 2019 status post lumpectomy.  She did not receive chemo or radiation therapy. On Arimidex.  TEST/EVENTS :  Pulmonary function test June 2017: stage III COPD FEV1 0.88 L/47% in 10/21/2015 and 0.69 L/37% later in June  2017 CT chest lung cancer screening 10/09/2015: Emphysema with very small lung nodules Echo January 2016: Shows grade 1 diastolic dysfunction Nuclear medicine cardiac stress to 12/11/2015: Normal tests Lexiscan Myoview with ejection fraction 75% PSG 12/2014 >AHI 35/h  12/2018 Lung-RADS 2, benign appearance or behavior. Continue annual screening with low-dose chest CT without contrast in 12 months.   Patient presents for a 41-month follow-up.  Patient has underlying severe COPD.  She remains on Trelegy daily and Daliresp daily .  Says overall breathing is doing okay. Gets winded with activity . No flare of cough or wheezing  Needs new neb machine and refills of albuterol neb. Machine is too old.   Patient participates in the low-dose CT screening program.  CT chest on January 11, 2020 showed a lung RADS 3.  She had multiple small nodules measuring up to 5.9 mm.  She has been planned for a follow-up CT chest in 6 months.  She has chronic respiratory failure is on oxygen 3 L on her portable system at 2 L at home.  Says she is had no increased oxygen demands.  She gets winded with minimum activity.  No change in her activity tolerance  She remains busy.  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   She does have sleep apnea but is CPAP intolerant.  Covid vaccines are up-to-date. Needs booster and flu shot .   OV 11/18/2020  Subjective:  Patient ID: Victoria [name], female , DOB: May 06, 1946 , age 12 y.o. , MRN: [id number] , ADDRESS: [address] [place] [address] PCP Etta Grandchild, MD Patient Care Team: Etta Grandchild, MD as PCP - General (Internal Medicine) Lennette Bihari, MD as PCP - Cardiology (Cardiology) Kalman Shan, MD as Consulting Physician (Pulmonary Disease) Richardean Chimera, MD as Consulting Physician (Obstetrics and Gynecology) Magrinat, Valentino Hue, MD as Consulting Physician  (Oncology) Emelia Loron, MD as Consulting Physician (General Surgery) Antony Blackbird, MD as Consulting Physician (Radiation Oncology) Lennette Bihari, MD as Consulting Physician (Cardiology) Axel Filler, Larna Daughters, NP as Nurse Practitioner (Hematology and Oncology) Parrett, Virgel Bouquet, NP as Nurse Practitioner (Pulmonary Disease) Romero Belling, MD as Consulting Physician (Endocrinology) Kathyrn Sheriff, [facility] as Pharmacist (Pharmacist)  This Provider for this visit: Treatment Team:  Attending Provider: Kalman Shan, MD    11/18/2020 -   Chief Complaint  Patient presents with   Follow-up    Pt states her breathing has become some worse since last visit. States the heat worsens her breathing.     HPI Victoria [name] 77 y.o. -  Follow-up advanced COPD: Presents for follow-up.  She is on Trelegy and Daliresp and oxygen.  She uses oxygen 2 L at rest.  With the portable system she goes up to 3 L.  Overall she is stable without any exacerbations.  She ran out of diuretics approximately a week ago my CMA is refill this.  She is complaining that her portable oxygen system was too heavy.  She told me it is 20 pounds.  I carried it and is much lighter.  Later she admitted this was 5.6 pounds.  She wants something even lighter while doing groceries.  She is interested in [facility] system.  We looked up G for model and it is 3.3 pounds with  battery and she wants this.  I advised her that the insurance might not cover this.  I also advised her that if insurance covers this there she will have to return the current system which is somewhat reluctant.  I advised her that our [facility] can try to get this approved but in the absence of which she will have to try to pay for it for herself out-of-pocket which apparently cost $3000.  She will contact the company to see if there is a payment plan on this.  I advised that she could use a backpack and I gave her a reference for that.  Lung cancer  screening: No evidence of lung cancer in February 2022 CT chest.  1 year follow-up recommended.   Obesity: Following a low carbohydrate diet sheet that I recommended once before and is losing weight.  Sleep apnea: Uses CPAP at night   Mobiity - due to copd/OA/Obbesity - needs motorized wheel chair  CAT Score 07/25/2019 09/14/2017  Total CAT Score 12 24    01/14/2022 Follow up : Covid 19 infection , COPD , O2 RF  Patient presents for 2-week follow-up.  Patient was seen last visit via virtual visit.  She had tested positive for COVID-19 infection after going to a family reunion.  She was outside the window for antiviral therapy.  She was treated for an COPD exacerbation with a Z-Pak and prednisone taper.  Patient says since last visit she is feeling better.  She has decreased cough congestion.  Still remains tired and fatigued.  She remains on Trelegy inhaler daily.  She is on Daliresp daily.  She denies any hemoptysis, chest pain, orthopnea, increased edema.  She remains on oxygen 2 L.  Denies any increased oxygen demands. Uses a POC with 3l/m pulsed oxygen.  Eating well. No n/v/d. Remains tired with low energy.  Participates in Lung cancer screening program . CT chest 09/21/21 Lung Rads 2 , Emphysema , stable tiny nodules. No suspicious nodules .  Still working to get scooter has hard time getting around. Gets winded with minimal activity  Also looking at lighter weight POC as simply go is too heavy .    OV 06/03/2022  Subjective:  Patient ID: Victoria [name], female , DOB: 07-04-1945 , age 89 y.o. , MRN: [id number] , ADDRESS: [address] [place] [address] PCP Etta Grandchild, MD Patient Care Team: Etta Grandchild, MD as PCP - General (Internal Medicine) Lennette Bihari, MD as PCP - Cardiology (Cardiology) Kalman Shan, MD as Consulting Physician (Pulmonary Disease) Richardean Chimera, MD as Consulting Physician (Obstetrics and Gynecology) Magrinat, Valentino Hue, MD (Inactive) as  Consulting Physician (Oncology) Emelia Loron, MD as Consulting Physician (General Surgery) Antony Blackbird, MD as Consulting Physician (Radiation Oncology) Lennette Bihari, MD as Consulting Physician (Cardiology) Axel Filler, Larna Daughters, NP as Nurse Practitioner (Hematology and Oncology) Parrett, Virgel Bouquet, NP as Nurse Practitioner (Pulmonary Disease) Romero Belling, MD (Inactive) as Consulting Physician (Endocrinology) Ellin Saba, [facility] (Inactive) (Pharmacist)  This Provider for this visit: Treatment Team:  Attending Provider: Kalman Shan, MD    06/03/2022 -   Chief Complaint  Patient presents with   Follow-up    Mobile scooter, research program for emphysema ??       HPI Victoria [name] 77 y.o. -presents for follow-up.  I personally not seen her since July 2022.  In the interim she saw a Publishing rights manager.  She missed the December 2023 appointment.  At this point in time  she continues to be stable on triple inhaler therapy and oxygen.  Her last COPD exacerbation was in summer 2023.  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  In the vending stall there is a lady who expressed to her that it was a procedure offered at [facility].  I suspect this might be the Zephyr valve procedure.  Patient was not sure.  Showed a YouTube video about this and we took a shared decision making to make a referral.  She is also inquiring about motorized wheelchair.  Last CT scan of the chest was in March 2023 and there was no evidence of lung cancer at that time.  She should have a follow-up CT scan in the spring 2024/summer 2024 range.   OV 11/01/22 - [facility]     Anjalee Cope is a 77 y.o. female former smoker with history of previous breast CA s/p surgical lumpectomy (no active disease), obesity, OSA not currently on CPAP and emphysema with COPD on 2LNC and dyspnea on exertion who presents for consultation regarding possible bronchoscopic lung volume  reduction. I had a long discussion with Mrs. Yetta Barre and her daughter in law and reviewed that lung volume reduction can be achieved either surgically or bronchoscopically in appropriately selected patients, as well as the expected outcomes/risks/benefits of elective bronchoscopic lung volume reduction (endobronchial valves). She has previously completed pulmonary rehab and did have some benefit at the time, though has largely been inactive since. She is interested in restarting a program which she understands would be important in her work up and she is going to get re-referred locally. She is hoping with this and some weight loss she may not need valves/intervention, with which I agree. We are somewhat limited in her objective data today as we do not have full PFTs and she was unable to lay flat for her CT chest. I am going to have her return to clinic in ~24months for repeat evaluation after she has re-enrolled in pulmonary rehab for reassessment with PFTs, , CT chest and perfusion study at that time.  I do think in her case with her symptoms and untreated sleep apnea we would want a repeat echocardiogram and RA ABG prior to valve placement if she would like to continue her work up at follow up.  Most importantly, she reports a true nickel allergy though has never been formally tested; these are rare, but possible and would be a contraindication for valve placement. I offered a [facility] allergy referral, but she is going to pursue a local referral for allergy testing in the interim.   OV 10/05/2022  Subjective:  Patient ID: Victoria [name], female , DOB: [birth year] , age 30 y.o. , MRN: [id number] , ADDRESS: [address] [place] [address] PCP Etta Grandchild, MD Patient Care Team: Etta Grandchild, MD as PCP - General (Internal Medicine) Lennette Bihari, MD as PCP - Cardiology (Cardiology) Kalman Shan, MD as Consulting Physician (Pulmonary Disease) Richardean Chimera, MD as Consulting  Physician (Obstetrics and Gynecology) Magrinat, Valentino Hue, MD (Inactive) as Consulting Physician (Oncology) Emelia Loron, MD as Consulting Physician (General Surgery) Antony Blackbird, MD as Consulting Physician (Radiation Oncology) Lennette Bihari, MD as Consulting Physician (Cardiology) Causey, Larna Daughters, NP as Nurse Practitioner (Hematology and Oncology) Parrett, Virgel Bouquet, NP as Nurse Practitioner (Pulmonary Disease) Romero Belling, MD (Inactive) as Consulting Physician (Endocrinology) Erlene Quan Vinnie Level, [facility] (Inactive) (Pharmacist)  This Provider for this visit: Treatment Team:  Attending Provider: Marchelle Gearing,  Carmin Muskrat, MD    10/05/2022 -   Chief Complaint  Patient presents with   Follow-up    F/up on PFT     HPI Victoria [name] 77 y.o. -follow-up Gold stage III COPD with chronic respiratory failure.  After her last visit she has had pulmonary function test FEV1 of 0.73 L / 35% with a DLCO of 9.41/48%.  She continues to use oxygen.  She wanted a referral to [facility] for [facility] valve evaluation.  Apparently she is being referred to some doctor in [place].  She has an appointment pending November 01, 2022.  She cannot mention the name other than the fact the last time sounded [place].  She is also post have low-dose CT scan of the chest for lung cancer screening prior to this visit but that is scheduled for later in June 2024.  She wanted help to remember the time and date and location.  I will ask my CMA to give her this information.  She still try to get a mobile scooter.  In fact when I walked in she was on the phone with's scooter company.  She was upset that they could not give her foldable scooter via insurance because she still drives around.  Now that she is compliantly taking her medicines that include oxygen, Daliresp and Trelegy.  She is not in exacerbation.     OV 12/20/2022  Subjective:  Patient ID: Victoria [name], female , DOB: [birth year] , age 56 y.o. ,  MRN: [id number] , ADDRESS: [address] [place] [address] PCP Etta Grandchild, MD Patient Care Team: Etta Grandchild, MD as PCP - General (Internal Medicine) Lennette Bihari, MD as PCP - Cardiology (Cardiology) Kalman Shan, MD as Consulting Physician (Pulmonary Disease) Richardean Chimera, MD as Consulting Physician (Obstetrics and Gynecology) Magrinat, Valentino Hue, MD (Inactive) as Consulting Physician (Oncology) Emelia Loron, MD as Consulting Physician (General Surgery) Antony Blackbird, MD as Consulting Physician (Radiation Oncology) Lennette Bihari, MD as Consulting Physician (Cardiology) Axel Filler, Larna Daughters, NP as Nurse Practitioner (Hematology and Oncology) Parrett, Virgel Bouquet, NP as Nurse Practitioner (Pulmonary Disease) Romero Belling, MD (Inactive) as Consulting Physician (Endocrinology) Ellin Saba, [facility] (Inactive) (Pharmacist)  This Provider for this visit: Treatment Team:  Attending Provider: Kalman Shan, MD    12/20/2022 -   Chief Complaint  Patient presents with   Follow-up    F/up on CT scan     HPI Victoria [name] 77 y.o. -returns fo followu. Saw Dr Orson Slick IP at [place] on 11/01/22. Per patient she was advised to loe weight and attend pulmo rehab as first steps. She is doing rehab and feels has helped. She is down to 2L [place] at exercise and does level 2 on nustep for 30 min. Plans to lose weight  by talking to PCP. Otherwise well. Having some knee pain issues and is using wheel chair. For knee pain uses supplement OPC2 which helps pain but currently out of it    CT Chest data from date: 12/17/22 - results NA as of 12/20/2022   - personally visualized and independently interpreted : done 12/17/22 - my findings are: no cancer but some basal scar tissue   PFT     Latest Ref Rng & Units 09/30/2022    3:52 PM 04/15/2016    1:02 PM 11/05/2015   12:08 PM 10/21/2015   10:56 AM  ILD indicators  FVC-Pre L 1.58  1.41  1.50  1.66    FVC-Predicted  Pre % 57  58  63  69   FVC-Post L 1.45  1.38  1.50  1.83   FVC-Predicted Post % 52  57  63  77   TLC L 6.24      TLC Predicted % 121      DLCO uncorrected ml/min/mmHg 9.41  9.45     DLCO UNC %Pred % 48  36     DLCO Corrected ml/min/mmHg 9.41  9.47     DLCO COR %Pred % 48  37         LAB RESULTS last 96 hours No results found.  LAB RESULTS last 90 days Recent Results (from the past 2160 hour(s))  Pulmonary function test     Status: None   Collection Time: 09/30/22  3:52 PM  Result Value Ref Range   FVC-Pre 1.58 L   FVC-%Pred-Pre 57 %   FVC-Post 1.45 L   FVC-%Pred-Post 52 %   FVC-%Change-Post -8 %   FEV1-Pre 0.73 L   FEV1-%Pred-Pre 35 %   FEV1-Post 0.67 L   FEV1-%Pred-Post 32 %   FEV1-%Change-Post -7 %   FEV6-Pre 1.48 L   FEV6-%Pred-Pre 56 %   FEV6-Post 1.38 L   FEV6-%Pred-Post 52 %   FEV6-%Change-Post -6 %   Pre FEV1/FVC ratio 46 %   FEV1FVC-%Pred-Pre 61 %   Post FEV1/FVC ratio 46 %   FEV1FVC-%Change-Post 0 %   Pre FEV6/FVC Ratio 93 %   FEV6FVC-%Pred-Pre 98 %   Post FEV6/FVC ratio 95 %   FEV6FVC-%Pred-Post 100 %   FEV6FVC-%Change-Post 1 %   FEF 25-75 Pre 0.28 L/sec   FEF2575-%Pred-Pre 17 %   FEF 25-75 Post 0.23 L/sec   FEF2575-%Pred-Post 14 %   FEF2575-%Change-Post -17 %   RV 4.56 L   RV % pred 191 %   TLC 6.24 L   TLC % pred 121 %   DLCO unc 9.41 ml/min/mmHg   DLCO unc % pred 48 %   DLCO cor 9.41 ml/min/mmHg   DLCO cor % pred 48 %   DL/VA 4.09 ml/min/mmHg/L   DL/VA % pred 67 %  Glucose, capillary     Status: Abnormal   Collection Time: 11/24/22 11:40 AM  Result Value Ref Range   Glucose-Capillary 108 (H) 70 - 99 mg/dL    Comment: Glucose reference range applies only to samples taken after fasting for at least 8 hours.  Glucose, capillary     Status: Abnormal   Collection Time: 12/02/22 10:23 AM  Result Value Ref Range   Glucose-Capillary 131 (H) 70 - 99 mg/dL    Comment: Glucose reference range applies only to samples taken after  fasting for at least 8 hours.  Glucose, capillary     Status: Abnormal   Collection Time: 12/02/22 11:35 AM  Result Value Ref Range   Glucose-Capillary 152 (H) 70 - 99 mg/dL    Comment: Glucose reference range applies only to samples taken after fasting for at least 8 hours.  Glucose, capillary     Status: Abnormal   Collection Time: 12/07/22 10:32 AM  Result Value Ref Range   Glucose-Capillary 125 (H) 70 - 99 mg/dL    Comment: Glucose reference range applies only to samples taken after fasting for at least 8 hours.  Glucose, capillary     Status: Abnormal   Collection Time: 12/07/22 11:54 AM  Result Value Ref Range   Glucose-Capillary 111 (H) 70 - 99 mg/dL    Comment: Glucose reference range applies only to samples  taken after fasting for at least 8 hours.         has a past medical history of Abdominal aneurysm (HCC), Anemia, Arthritis, Breast cancer in female [facility]), COPD (chronic obstructive pulmonary disease) (HCC), Dyspnea, Headache, Heart murmur, Hyperlipidemia, Hypertension, Multiple thyroid nodules, Murmur, cardiac (1950), Osteoporosis, Pneumonia, Pre-diabetes, Requires continuous at home supplemental oxygen, Sebaceous cyst, Sleep apnea, and Varicella (as child).   reports that she quit smoking about 11 years ago. Her smoking use included cigarettes. She started smoking about 61 years ago. She has a 50 pack-year smoking history. She has never used smokeless tobacco.  Past Surgical History:  Procedure Laterality Date   APPENDECTOMY     2008   BREAST LUMPECTOMY Left 08/31/2017   BREAST LUMPECTOMY WITH RADIOACTIVE SEED LOCALIZATION Left 08/10/2017   Procedure: BREAST LUMPECTOMY WITH RADIOACTIVE SEED LOCALIZATION;  Surgeon: Emelia Loron, MD;  Location: [facility] OR;  Service: General;  Laterality: Left;   CESAREAN SECTION     1972   COLONOSCOPY     COLONOSCOPY WITH PROPOFOL N/A 04/16/2016   Procedure: COLONOSCOPY WITH PROPOFOL;  Surgeon: Jeani Hawking, MD;  Location: [facility]  ENDOSCOPY;  Service: Endoscopy;  Laterality: N/A;   CYST REMOVAL NECK Left 04/13/2016   Procedure: EXCISION OF SEBACEOUS CYST LEFT POSTERIOR NECK;  Surgeon: Darnell Level, MD;  Location: [facility] OR;  Service: General;  Laterality: Left;   Excision of Pelvic Absess, Right Ovary     2008   RE-EXCISION OF BREAST CANCER,SUPERIOR MARGINS Left 08/31/2017   Procedure: RE-EXCISION OF LEFT  BREAST MARGINS ERAS PATHWAY;  Surgeon: Emelia Loron, MD;  Location: [facility] OR;  Service: General;  Laterality: Left;   TUBAL LIGATION  1980    Allergies  Allergen Reactions   Benicar [Olmesartan] Swelling    Swelling of face and arms    [name] [Valsartan] Swelling    Swelling of face and arms    Hydrocodone-Acetaminophen Nausea And Vomiting    Severe vomiting   Lisinopril Cough   Monosodium Glutamate Other (See Comments)    Facial swelling per pt   Codeine Other (See Comments)    jittery   Lead Acetate Rash   Nickel Rash    Severe rash to infection: pt is allergic to all metals other than sterling silver or gold jewelry.     Immunization History  Administered Date(s) Administered   Fluad Quad(high Dose 65+) 01/17/2019, 04/30/2020, 02/23/2021, 06/15/2022   Influenza Split 01/16/2011   Influenza, High Dose Seasonal PF 01/13/2017, 02/09/2018   Influenza,inj,Quad PF,6+ Mos 02/01/2013, 02/27/2014, 04/07/2015, 01/08/2016   PFIZER(Purple Top)SARS-COV-2 Vaccination 06/06/2019, 06/27/2019, 05/21/2020   Pneumococcal Conjugate-13 02/01/2013   Pneumococcal Polysaccharide-23 10/29/2014, 06/03/2021   Td 12/06/2011   Zoster Recombinant(Shingrix) 11/21/2019, 05/12/2020    Family History  Problem Relation Age of Onset   Heart disease Mother        MI - fatal   Hypertension Mother    Stroke Father 15       fatal   Alzheimer's disease Father    Alzheimer's disease Brother    Hyperlipidemia Brother    Hypertension Brother    Diabetes Brother    Hypertension Brother    Hyperlipidemia Brother    Breast cancer  Paternal Grandmother      Current Outpatient Medications:    acetaminophen (TYLENOL) 500 MG tablet, Take 1,000 mg by mouth every 6 (six) hours as needed., Disp: , Rfl:    albuterol (PROVENTIL) (2.5 MG/3ML) 0.083% nebulizer solution, Take 3 mLs (2.5 mg total) by nebulization every 6 (  six) hours as needed for wheezing or shortness of breath., Disp: 75 mL, Rfl: 3   Albuterol Sulfate (PROAIR RESPICLICK) 108 (90 Base) MCG/ACT AEPB, Inhale 1-2 puffs into the lungs every 6 (six) hours as needed (for wheezing/shortness of breath)., Disp: 1 each, Rfl: 5   amLODipine (NORVASC) 10 MG tablet, TAKE ONE TABLET BY MOUTH ONCE DAILY, Disp: 30 tablet, Rfl: 0   Blood Glucose Monitoring Suppl (ACCU-CHEK AVIVA PLUS) w/Device KIT, 1 Device by Does not apply route daily. Use to monitor glucose levels once per day; E11.9, Disp: 1 kit, Rfl: 0   carvedilol (COREG) 6.25 MG tablet, TAKE ONE TABLET BY MOUTH EVERY MORNING and TAKE ONE TABLET BY MOUTH EVERYDAY AT BEDTIME, Disp: 60 tablet, Rfl: 0   Cholecalciferol (VITAMIN D3) 125 MCG (5000 UT) CAPS, Take 1 capsule (5,000 Units total) by mouth daily., Disp: 90 capsule, Rfl: 1   dapagliflozin propanediol (FARXIGA) 10 MG TABS tablet, TAKE ONE TABLET BY MOUTH BEFORE BREAKFAST, Disp: 90 tablet, Rfl: 0   denosumab (PROLIA) 60 MG/ML SOSY injection, Inject 60 mg into the skin every 6 (six) months., Disp: 1 mL, Rfl: 1   Fluticasone-Umeclidin-Vilant (TRELEGY ELLIPTA) 100-62.5-25 MCG/ACT AEPB, INHALE 1 PUFF BY MOUTH INTO LUNGS DAILY, Disp: 60 each, Rfl: 5   glucose blood (ACCU-CHEK AVIVA PLUS) test strip, USE TO Check blood glucose THREE TIMES DAILY, Disp: 300 strip, Rfl: 5   glucose blood (ACCU-CHEK GUIDE) test strip, USE 1 STRIP ONCE DAILY, Disp: 100 each, Rfl: 0   glucose blood test strip, USE 1 STRIP ONCE DAILY, Disp: 100 each, Rfl: 12   methocarbamol (ROBAXIN) 500 MG tablet, Take 1 tablet (500 mg total) by mouth every 8 (eight) hours as needed for muscle spasms., Disp: 30 tablet, Rfl:  0   OXYGEN, Inhale 2-3 L into the lungs continuous. When exerting self, Disp: , Rfl:    roflumilast (DALIRESP) 500 MCG TABS tablet, TAKE ONE TABLET BY MOUTH EVERY MORNING, Disp: 90 tablet, Rfl: 3   rosuvastatin (CRESTOR) 40 MG tablet, TAKE ONE TABLET BY MOUTH EVERYDAY AT BEDTIME, Disp: 30 tablet, Rfl: 0   SitaGLIPtin-MetFORMIN HCl (JANUMET XR) 50-1000 MG TB24, Take 2 tablets by mouth daily before supper., Disp: 180 tablet, Rfl: 3   spironolactone (ALDACTONE) 25 MG tablet, TAKE ONE TABLET BY MOUTH ONCE DAILY, Disp: 30 tablet, Rfl: 0   thiamine 100 MG tablet, TAKE ONE TABLET BY MOUTH every other DAY, Disp: 45 tablet, Rfl: 0   Accu-Chek Softclix Lancets lancets, Use as instructed to check blood sugar 3X daily (Patient not taking: Reported on 11/24/2022), Disp: 100 each, Rfl: 1   aspirin EC 81 MG tablet, Take 1 tablet (81 mg total) by mouth daily. (Patient not taking: Reported on 11/24/2022), Disp: 90 tablet, Rfl: 3   potassium chloride SA (KLOR-CON) 20 MEQ tablet, Take 1 tablet (20 mEq total) by mouth 2 (two) times daily. (Patient not taking: Reported on 11/24/2022), Disp: 180 tablet, Rfl: 0      Objective:   Vitals:   12/20/22 1002  BP: 130/60  Pulse: 77  SpO2: 92%  Weight: 194 lb (88 kg)  Height: 5' 4.5" (1.638 m)    Estimated body mass index is 32.79 kg/m as calculated from the following:   Height as of this encounter: 5' 4.5" (1.638 m).   Weight as of this encounter: 194 lb (88 kg).  @WEIGHTCHANGE @  [employer]   12/20/22 1002  Weight: 194 lb (88 kg)     Physical Exam   General: No  distress. obese O2 at rest: yes Cane present: no Sitting in wheel chair: yes Frail: no Obese: yes Neuro: Alert and Oriented x 3. GCS 15. Speech normal Psych: Pleasant Resp:  Barrel Chest - no.  Wheeze - no, Crackles - no, No overt respiratory distress CVS: Normal heart sounds. Murmurs - no Ext: Stigmata of Connective Tissue Disease - no HEENT: Normal upper airway. PEERL +. No post nasal  drip        Assessment:       ICD-10-CM   1. COPD, severe (HCC)  J44.9     2. Cancer screening  Z12.9     3. Vaccine counseling  Z71.85          Plan:     Patient Instructions  COPD, severe (HCC) Chronic respiratory failure with hypoxia (HCC)  -Stable disease  without flare up  -glad rehab is helping you - glad you saw docs in [place] for Zephyr valve evaluation and they have advised weight loss and rehab as first steps  Plan -Continue oxygen, Trelegy and Daliresp as before.  - contnue rehab - talk to PCP Etta Grandchild, MD or [facility] about weight loss drugs - high dose flu shot in fall  - RSV vaccine commercially    Cancer screening  -No evidence of lung cancer in August 2020 low-dose CT scan of the chest and feb 2022 ad mar 2023 - resutls for August 2024 CT chest not out yet  Plan -Await results of August 2024 CT chest   - pleas sign up for my chart if you have not    Follow-up - 6 months with Dr Marchelle Gearing or sooner if needed   FOLLOWUP Return in about 6 months (around 06/22/2023) for 15 min visit, copd, with Dr Marchelle Gearing, Face to Face Visit.    SIGNATURE    Dr. Kalman Shan, M.D., F.C.C.P,  Pulmonary and Critical Care Medicine Staff Physician, [facility] Center Director - Interstitial Lung Disease  Program  Pulmonary Fibrosis [facility] at [facility] [place], [place], [address]  Pager: [phone], If no answer or between  15:00h - 7:00h: call [phone] Telephone: [phone]  10:31 AM 12/20/2022

## 2022-12-20 ENCOUNTER — Encounter: Payer: Self-pay | Admitting: Internal Medicine

## 2022-12-20 ENCOUNTER — Ambulatory Visit: Payer: 59 | Admitting: Internal Medicine

## 2022-12-20 VITALS — BP 130/60 | HR 77 | Ht 64.5 in | Wt 194.0 lb

## 2022-12-20 DIAGNOSIS — Z129 Encounter for screening for malignant neoplasm, site unspecified: Secondary | ICD-10-CM | POA: Diagnosis not present

## 2022-12-20 DIAGNOSIS — Z7185 Encounter for immunization safety counseling: Secondary | ICD-10-CM | POA: Diagnosis not present

## 2022-12-20 DIAGNOSIS — J449 Chronic obstructive pulmonary disease, unspecified: Secondary | ICD-10-CM

## 2022-12-21 ENCOUNTER — Encounter (HOSPITAL_COMMUNITY)
Admission: RE | Admit: 2022-12-21 | Discharge: 2022-12-21 | Disposition: A | Discharge status: Home / Self Care | Payer: 59 | Source: Ambulatory Visit | Attending: Internal Medicine | Admitting: Internal Medicine

## 2022-12-21 VITALS — Wt 195.8 lb

## 2022-12-21 DIAGNOSIS — J449 Chronic obstructive pulmonary disease, unspecified: Secondary | ICD-10-CM

## 2022-12-21 DIAGNOSIS — J439 Emphysema, unspecified: Secondary | ICD-10-CM | POA: Diagnosis not present

## 2022-12-21 NOTE — Progress Notes (Signed)
Daily Session Note  Patient Details  Name: Victoria [name] MRN: [id number] Date of Birth: March 10, 1946 Referring Provider:   Doristine Devoid Pulmonary Rehab Walk Test from 11/24/2022 in [facility]  Referring Provider Ramaswamy       Encounter Date: 12/21/2022  Check In:  Session Check In - 12/21/22 1221       Check-In   Supervising physician immediately available to respond to emergencies [facility] MD immediately available    Physician(s) Carlos Levering, NP    Location [facility]    Staff Present Elissa Lovett BS, ACSM-CEP, Exercise Physiologist; Gerre Scull, RN, BSN;Casey Charlean Sanfilippo, MS, ACSM-CEP, Exercise Physiologist    Virtual Visit No    Medication changes reported     No    Fall or balance concerns reported    No    Tobacco Cessation No Change    Warm-up and Cool-down Performed as group-led instruction    Resistance Training Performed Yes    VAD Patient? No    PAD/SET Patient? No      Pain Assessment   Currently in Pain? No/denies    Pain Score 0-No pain    Multiple Pain Sites No             Capillary Blood Glucose: No results found. However, due to the size of the patient record, not all encounters were searched. Please check Results Review for a complete set of results.   Exercise Prescription Changes - 12/21/22 1200       Response to Exercise   Blood Pressure (Admit) 130/54    Blood Pressure (Exercise) 140/50    Blood Pressure (Exit) 130/50    Heart Rate (Admit) 83 bpm    Heart Rate (Exercise) 87 bpm    Heart Rate (Exit) 81 bpm    Oxygen Saturation (Admit) 97 %    Oxygen Saturation (Exercise) 96 %    Oxygen Saturation (Exit) 98 %    Rating of Perceived Exertion (Exercise) 11    Perceived Dyspnea (Exercise) 1    Duration Progress to 30 minutes of  aerobic without signs/symptoms of physical distress    Intensity THRR unchanged      Progression   Progression  Continue to progress workloads to maintain intensity without signs/symptoms of physical distress.      Resistance Training   Training Prescription Yes    Weight red bands    Reps 10-15    Time 10 Minutes      Oxygen   Oxygen Continuous    Liters 2      NuStep   Level 2    Minutes 30    METs 1.5      Oxygen   Maintain Oxygen Saturation 88% or higher             Social History   Tobacco Use  Smoking Status Former   Current packs/day: 0.00   Average packs/day: 1 pack/day for 50.0 years (50.0 ttl pk-yrs)   Types: Cigarettes   Start date: 04/15/1961   Quit date: 04/16/2011   Years since quitting: 11.6  Smokeless Tobacco Never  Tobacco Comments   quit that date when she had to go to ER     Goals Met:  Exercise tolerated well No report of concerns or symptoms today Strength training completed today  Goals Unmet:  Not Applicable  Comments: Service time is from 1013 to 1130    Dr. Mechele Collin is Medical  Director for Pulmonary Rehab at [facility].

## 2022-12-22 NOTE — Progress Notes (Signed)
Pulmonary Individual Treatment Plan  Patient Details  Name: Victoria [name] MRN: [id number] Date of Birth: August 23, 1945 Referring Provider:   Doristine Devoid Pulmonary Rehab Walk Test from 11/24/2022 in [facility]  Referring Provider Ramaswamy       Initial Encounter Date:  Flowsheet Row Pulmonary Rehab Walk Test from 11/24/2022 in [facility]  Date 11/24/22       Visit Diagnosis: Stage 3 severe COPD by GOLD classification (HCC)  [name] Home Medications on Admission:   Current Outpatient Medications:    Accu-Chek Softclix Lancets lancets, Use as instructed to check blood sugar 3X daily (Patient not taking: Reported on 11/24/2022), Disp: 100 each, Rfl: 1   acetaminophen (TYLENOL) 500 MG tablet, Take 1,000 mg by mouth every 6 (six) hours as needed., Disp: , Rfl:    albuterol (PROVENTIL) (2.5 MG/3ML) 0.083% nebulizer solution, Take 3 mLs (2.5 mg total) by nebulization every 6 (six) hours as needed for wheezing or shortness of breath., Disp: 75 mL, Rfl: 3   Albuterol Sulfate (PROAIR RESPICLICK) 108 (90 Base) MCG/ACT AEPB, Inhale 1-2 puffs into the lungs every 6 (six) hours as needed (for wheezing/shortness of breath)., Disp: 1 each, Rfl: 5   amLODipine (NORVASC) 10 MG tablet, TAKE ONE TABLET BY MOUTH ONCE DAILY, Disp: 30 tablet, Rfl: 0   aspirin EC 81 MG tablet, Take 1 tablet (81 mg total) by mouth daily. (Patient not taking: Reported on 11/24/2022), Disp: 90 tablet, Rfl: 3   Blood Glucose Monitoring Suppl (ACCU-CHEK AVIVA PLUS) w/Device KIT, 1 Device by Does not apply route daily. Use to monitor glucose levels once per day; E11.9, Disp: 1 kit, Rfl: 0   carvedilol (COREG) 6.25 MG tablet, TAKE ONE TABLET BY MOUTH EVERY MORNING and TAKE ONE TABLET BY MOUTH EVERYDAY AT BEDTIME, Disp: 60 tablet, Rfl: 0   Cholecalciferol (VITAMIN D3) 125 MCG (5000 UT) CAPS, Take 1 capsule  (5,000 Units total) by mouth daily., Disp: 90 capsule, Rfl: 1   dapagliflozin propanediol (FARXIGA) 10 MG TABS tablet, TAKE ONE TABLET BY MOUTH BEFORE BREAKFAST, Disp: 90 tablet, Rfl: 0   denosumab (PROLIA) 60 MG/ML SOSY injection, Inject 60 mg into the skin every 6 (six) months., Disp: 1 mL, Rfl: 1   Fluticasone-Umeclidin-Vilant (TRELEGY ELLIPTA) 100-62.5-25 MCG/ACT AEPB, INHALE 1 PUFF BY MOUTH INTO LUNGS DAILY, Disp: 60 each, Rfl: 5   glucose blood (ACCU-CHEK AVIVA PLUS) test strip, USE TO Check blood glucose THREE TIMES DAILY, Disp: 300 strip, Rfl: 5   glucose blood (ACCU-CHEK GUIDE) test strip, USE 1 STRIP ONCE DAILY, Disp: 100 each, Rfl: 0   glucose blood test strip, USE 1 STRIP ONCE DAILY, Disp: 100 each, Rfl: 12   methocarbamol (ROBAXIN) 500 MG tablet, Take 1 tablet (500 mg total) by mouth every 8 (eight) hours as needed for muscle spasms., Disp: 30 tablet, Rfl: 0   OXYGEN, Inhale 2-3 L into the lungs continuous. When exerting self, Disp: , Rfl:    potassium chloride SA (KLOR-CON) 20 MEQ tablet, Take 1 tablet (20 mEq total) by mouth 2 (two) times daily. (Patient not taking: Reported on 11/24/2022), Disp: 180 tablet, Rfl: 0   roflumilast (DALIRESP) 500 MCG TABS tablet, TAKE ONE TABLET BY MOUTH EVERY MORNING, Disp: 90 tablet, Rfl: 3   rosuvastatin (CRESTOR) 40 MG tablet, TAKE ONE TABLET BY MOUTH EVERYDAY AT BEDTIME, Disp: 30 tablet, Rfl: 0   SitaGLIPtin-MetFORMIN HCl (JANUMET XR) 50-1000 MG  TB24, Take 2 tablets by mouth daily before supper., Disp: 180 tablet, Rfl: 3   spironolactone (ALDACTONE) 25 MG tablet, TAKE ONE TABLET BY MOUTH ONCE DAILY, Disp: 30 tablet, Rfl: 0   thiamine 100 MG tablet, TAKE ONE TABLET BY MOUTH every other DAY, Disp: 45 tablet, Rfl: 0  Past Medical History: Past Medical History:  Diagnosis Date   Abdominal aneurysm (HCC)    Dr. Darrick Penna follows lLOV 2 ''17 per pt "around 2 cm"   Anemia    as a child   Arthritis    left ankle, right knee, right SI joint, wrists, lower  back   Breast cancer in female [facility])    Left   COPD (chronic obstructive pulmonary disease) (HCC)    ephysema-Dr. 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 Gerrit Friends in [place]   Sleep apnea    cpap used sometimes, uses oxygen concentrator 2 l/m nasally bedtime   Varicella as child    Tobacco Use: Social History   Tobacco Use  Smoking Status Former   Current packs/day: 0.00   Average packs/day: 1 pack/day for 50.0 years (50.0 ttl pk-yrs)   Types: Cigarettes   Start date: 04/15/1961   Quit date: 04/16/2011   Years since quitting: 11.6  Smokeless Tobacco Never  Tobacco Comments   quit that date when she had to go to ER     Labs: Review Flowsheet  More data exists      Latest Ref Rng & Units 02/23/2021 07/22/2021 11/10/2021 03/25/2022 07/19/2022  Labs for ITP Cardiac and Pulmonary Rehab  Cholestrol 100 - 199 mg/dL - - 884  - -  LDL (calc) 0 - 99 mg/dL - - 40  - -  HDL-C >16 mg/dL - - 51  - -  Trlycerides 0 - 149 mg/dL - - 606  - -  Hemoglobin A1c 4.0 - 5.6 % 6.7  7.2  - 7.1  7.2     Details            Capillary Blood Glucose: Lab Results  Component Value Date   GLUCAP 111 (H) 12/07/2022   GLUCAP 125 (H) 12/07/2022   GLUCAP 152 (H) 12/02/2022   GLUCAP 131 (H) 12/02/2022   GLUCAP 108 (H) 11/24/2022    POCT Glucose     Row Name 11/24/22 1141             POCT Blood Glucose   Pre-Exercise 108 mg/dL                Pulmonary Assessment Scores:  Pulmonary Assessment Scores     Row Name 11/24/22 1120         ADL UCSD   SOB Score total 68       CAT Score    CAT Score 24       mMRC Score   mMRC Score 3             UCSD: Self-administered rating of dyspnea associated  with activities of daily living (ADLs) 6-point scale (0 = "not at all" to 5 = "maximal or unable to do because of breathlessness")  Scoring Scores range from 0 to 120.  Minimally important difference is 5 units  CAT: CAT can identify the health impairment of COPD patients and is better correlated with disease progression.  CAT has a scoring range of zero to 40. The CAT score is classified into four groups of low (less than 10), medium (10 - 20), high (21-30) and very high (31-40) based on the impact level of disease on health status. A CAT score over 10 suggests significant symptoms.  A worsening CAT score could be explained by an exacerbation, poor medication adherence, poor inhaler technique, or progression of COPD or comorbid conditions.  CAT MCID is 2 points  mMRC: mMRC (Modified Medical Research Council) Dyspnea Scale is used to assess the degree of baseline functional disability in patients of respiratory disease due to dyspnea. No minimal important difference is established. A decrease in score of 1 point or greater is considered a positive change.   Pulmonary Function Assessment:  Pulmonary Function Assessment - 11/24/22 1120       Breath   Bilateral Breath Sounds Decreased    Shortness of Breath Yes;Fear of Shortness of Breath;Limiting activity             Exercise Target Goals: Exercise Program Goal: Individual exercise prescription set using results from initial 6 min walk test and THRR while considering  patient's activity barriers and safety.   Exercise Prescription Goal: Initial exercise prescription builds to 30-45 minutes a day of aerobic activity, 2-3 days per week.  Home exercise guidelines will be given to patient during program as part of exercise prescription that the participant will acknowledge.  Activity Barriers & Risk Stratification:   Activity Barriers & Cardiac Risk Stratification - 11/24/22 1059       Activity Barriers & Cardiac Risk Stratification   Activity Barriers Deconditioning;Muscular Weakness;Shortness of Breath;Joint Problems;Balance Concerns             6 Minute Walk:  6 Minute Walk     Row Name 11/24/22 1258         6 Minute Walk   Phase Initial     Distance 427 feet     Walk Time 6 minutes     # of Rest Breaks 2  stopped at 1:10, re-started at 2:30, stopped at 4:12, re-started at 5:20     MPH 0.81     METS 1.05     RPE 15     Perceived Dyspnea  3     VO2 Peak 3.67     Symptoms Yes (comment)     Comments dyspnea     Resting HR 82 bpm     Resting BP 158/78     Resting Oxygen Saturation  92 %     Exercise Oxygen Saturation  during 6 min walk 85 %     Max Ex. HR 105 bpm     Max Ex. BP 192/66     2 Minute Post BP 151/72       Interval HR   1 Minute HR 98     2 Minute HR 93     3 Minute HR 92     4 Minute HR 105     5 Minute HR 95     6 Minute HR 101     2 Minute Post HR 86  Interval Heart Rate? Yes       Interval Oxygen   Interval Oxygen? Yes     Baseline Oxygen Saturation % 92 %     1 Minute Oxygen Saturation % 91 %     1 Minute Liters of Oxygen 0 L     2 Minute Oxygen Saturation % 86 %  O2 dropped to 86% at 1:28     2 Minute Liters of Oxygen 1 L     3 Minute Oxygen Saturation % 85 %     3 Minute Liters of Oxygen 2 L     4 Minute Oxygen Saturation % 94 %     4 Minute Liters of Oxygen 2 L     5 Minute Oxygen Saturation % 97 %     5 Minute Liters of Oxygen 2 L     6 Minute Oxygen Saturation % 97 %     6 Minute Liters of Oxygen 2 L     2 Minute Post Oxygen Saturation % 99 %     2 Minute Post Liters of Oxygen 2 L              Oxygen Initial Assessment:  Oxygen Initial Assessment - 11/24/22 1105       Home Oxygen   Home Oxygen Device Portable Concentrator;Home Concentrator   back up tank if power outage   Sleep Oxygen Prescription Continuous    Liters per  minute 2    Home Exercise Oxygen Prescription None    Home Resting Oxygen Prescription Continuous    Liters per minute 2    Compliance with Home Oxygen Use Yes      Initial 6 min Walk   Oxygen Used Continuous    Liters per minute 2      Program Oxygen Prescription   Program Oxygen Prescription Continuous    Liters per minute 2      Intervention   Short Term Goals To learn and exhibit compliance with exercise, home and travel O2 prescription;To learn and understand importance of maintaining oxygen saturations>88%;To learn and demonstrate proper use of respiratory medications;To learn and understand importance of monitoring SPO2 with pulse oximeter and demonstrate accurate use of the pulse oximeter.;To learn and demonstrate proper pursed lip breathing techniques or other breathing techniques.     Long  Term Goals Verbalizes importance of monitoring SPO2 with pulse oximeter and return demonstration;Exhibits proper breathing techniques, such as pursed lip breathing or other method taught during program session;Demonstrates proper use of MDI's;Compliance with respiratory medication;Maintenance of O2 saturations>88%;Exhibits compliance with exercise, home  and travel O2 prescription             Oxygen Re-Evaluation:  Oxygen Re-Evaluation     Row Name 12/10/22 1136             Program Oxygen Prescription   Program Oxygen Prescription Continuous       Liters per minute 2         Home Oxygen   Home Oxygen Device Portable Concentrator;Home Concentrator       Sleep Oxygen Prescription Continuous       Liters per minute 2       Home Exercise Oxygen Prescription None       Home Resting Oxygen Prescription Continuous       Liters per minute 2       Compliance with Home Oxygen Use Yes         Goals/Expected Outcomes   Short Term Goals  To learn and exhibit compliance with exercise, home and travel O2 prescription;To learn and understand importance of maintaining oxygen  saturations>88%;To learn and demonstrate proper use of respiratory medications;To learn and understand importance of monitoring SPO2 with pulse oximeter and demonstrate accurate use of the pulse oximeter.;To learn and demonstrate proper pursed lip breathing techniques or other breathing techniques.        Long  Term Goals Verbalizes importance of monitoring SPO2 with pulse oximeter and return demonstration;Exhibits proper breathing techniques, such as pursed lip breathing or other method taught during program session;Demonstrates proper use of MDI's;Compliance with respiratory medication;Maintenance of O2 saturations>88%;Exhibits compliance with exercise, home  and travel O2 prescription       Goals/Expected Outcomes Compliance and understanding of oxygen saturation monitoring and breathing techniques to decrease shortness of breath.                Oxygen Discharge (Final Oxygen Re-Evaluation):  Oxygen Re-Evaluation - 12/10/22 1136       Program Oxygen Prescription   Program Oxygen Prescription Continuous    Liters per minute 2      Home Oxygen   Home Oxygen Device Portable Concentrator;Home Concentrator    Sleep Oxygen Prescription Continuous    Liters per minute 2    Home Exercise Oxygen Prescription None    Home Resting Oxygen Prescription Continuous    Liters per minute 2    Compliance with Home Oxygen Use Yes      Goals/Expected Outcomes   Short Term Goals To learn and exhibit compliance with exercise, home and travel O2 prescription;To learn and understand importance of maintaining oxygen saturations>88%;To learn and demonstrate proper use of respiratory medications;To learn and understand importance of monitoring SPO2 with pulse oximeter and demonstrate accurate use of the pulse oximeter.;To learn and demonstrate proper pursed lip breathing techniques or other breathing techniques.     Long  Term Goals Verbalizes importance of monitoring SPO2 with pulse oximeter and return  demonstration;Exhibits proper breathing techniques, such as pursed lip breathing or other method taught during program session;Demonstrates proper use of MDI's;Compliance with respiratory medication;Maintenance of O2 saturations>88%;Exhibits compliance with exercise, home  and travel O2 prescription    Goals/Expected Outcomes Compliance and understanding of oxygen saturation monitoring and breathing techniques to decrease shortness of breath.             Initial Exercise Prescription:  Initial Exercise Prescription - 11/24/22 1200       Date of Initial Exercise RX and Referring Provider   Date 11/24/22    Referring Provider Ramaswamy    Expected Discharge Date 02/24/23      Oxygen   Oxygen Continuous    Liters 3    Maintain Oxygen Saturation 88% or higher      NuStep   Level 1    SPM 50    Minutes 30    METs 2      Prescription Details   Frequency (times per week) 2    Duration Progress to 30 minutes of continuous aerobic without signs/symptoms of physical distress      Intensity   THRR 40-80% of Max Heartrate 57-114    Ratings of Perceived Exertion 11-13    Perceived Dyspnea 0-4      Progression   Progression Continue progressive overload as per policy without signs/symptoms or physical distress.      Resistance Training   Training Prescription Yes    Weight red bands    Reps 10-15  Perform Capillary Blood Glucose checks as needed.  Exercise Prescription Changes:   Exercise Prescription Changes     Row Name 12/07/22 1200 12/21/22 1200           Response to Exercise   Blood Pressure (Admit) 144/68 130/54      Blood Pressure (Exercise) 130/68 140/50      Blood Pressure (Exit) 158/68 130/50      Heart Rate (Admit) 84 bpm 83 bpm      Heart Rate (Exercise) 85 bpm 87 bpm      Heart Rate (Exit) 86 bpm 81 bpm      Oxygen Saturation (Admit) 98 % 97 %      Oxygen Saturation (Exercise) 97 % 96 %      Oxygen Saturation (Exit) 97 % 98 %       Rating of Perceived Exertion (Exercise) 11 11      Perceived Dyspnea (Exercise) 1 1      Duration Progress to 30 minutes of  aerobic without signs/symptoms of physical distress Progress to 30 minutes of  aerobic without signs/symptoms of physical distress      Intensity THRR unchanged THRR unchanged        Progression   Progression Continue to progress workloads to maintain intensity without signs/symptoms of physical distress. Continue to progress workloads to maintain intensity without signs/symptoms of physical distress.        Resistance Training   Training Prescription Yes Yes      Weight red bands red bands      Reps 10-15 10-15      Time 10 Minutes 10 Minutes        Oxygen   Oxygen Continuous Continuous      Liters 3 2        NuStep   Level 1 2      Minutes 30 30      METs 1.5 1.5        Oxygen   Maintain Oxygen Saturation 88% or higher 88% or higher               Exercise Comments:   Exercise Comments     Row Name 12/02/22 1133           Exercise Comments Pt completed first day of exercise. Victoria [name] exercised for 15 min on the Nustep. She tried to transition the arm ergometer due to her chronic pain. Victoria [name] did not tolerate the arm ergometer well as she was transitioned back to the Nustep. Victoria [name] is very deconditioned as she did not exercise inconsistently. Discussed METs and how to increase METs.                Exercise Goals and Review:   Exercise Goals     Row Name 11/24/22 1103 12/10/22 1133           Exercise Goals   Increase Physical Activity Yes Yes      Intervention Provide advice, education, support and counseling about physical activity/exercise needs.;Develop an individualized exercise prescription for aerobic and resistive training based on initial evaluation findings, risk stratification, comorbidities and participant's personal goals. Provide advice, education, support and counseling about physical activity/exercise needs.;Develop an  individualized exercise prescription for aerobic and resistive training based on initial evaluation findings, risk stratification, comorbidities and participant's personal goals.      Expected Outcomes Short Term: Attend rehab on a regular basis to increase amount of physical activity.;Long Term: Exercising regularly at least 3-5 days a week.;Long Term: Add  in home exercise to make exercise part of routine and to increase amount of physical activity. Short Term: Attend rehab on a regular basis to increase amount of physical activity.;Long Term: Exercising regularly at least 3-5 days a week.;Long Term: Add in home exercise to make exercise part of routine and to increase amount of physical activity.      Increase Strength and Stamina Yes Yes      Intervention Provide advice, education, support and counseling about physical activity/exercise needs.;Develop an individualized exercise prescription for aerobic and resistive training based on initial evaluation findings, risk stratification, comorbidities and participant's personal goals. Provide advice, education, support and counseling about physical activity/exercise needs.;Develop an individualized exercise prescription for aerobic and resistive training based on initial evaluation findings, risk stratification, comorbidities and participant's personal goals.      Expected Outcomes Short Term: Increase workloads from initial exercise prescription for resistance, speed, and METs.;Short Term: Perform resistance training exercises routinely during rehab and add in resistance training at home;Long Term: Improve cardiorespiratory fitness, muscular endurance and strength as measured by increased METs and functional capacity ( ) Short Term: Increase workloads from initial exercise prescription for resistance, speed, and METs.;Short Term: Perform resistance training exercises routinely during rehab and add in resistance training at home;Long Term: Improve  cardiorespiratory fitness, muscular endurance and strength as measured by increased METs and functional capacity ( )      Able to understand and use rate of perceived exertion (RPE) scale Yes Yes      Intervention Provide education and explanation on how to use RPE scale Provide education and explanation on how to use RPE scale      Expected Outcomes Short Term: Able to use RPE daily in rehab to express subjective intensity level;Long Term:  Able to use RPE to guide intensity level when exercising independently Short Term: Able to use RPE daily in rehab to express subjective intensity level;Long Term:  Able to use RPE to guide intensity level when exercising independently      Able to understand and use Dyspnea scale Yes Yes      Intervention Provide education and explanation on how to use Dyspnea scale Provide education and explanation on how to use Dyspnea scale      Expected Outcomes Short Term: Able to use Dyspnea scale daily in rehab to express subjective sense of shortness of breath during exertion;Long Term: Able to use Dyspnea scale to guide intensity level when exercising independently Short Term: Able to use Dyspnea scale daily in rehab to express subjective sense of shortness of breath during exertion;Long Term: Able to use Dyspnea scale to guide intensity level when exercising independently      Knowledge and understanding of Target Heart Rate Range (THRR) Yes Yes      Intervention Provide education and explanation of THRR including how the numbers were predicted and where they are located for reference Provide education and explanation of THRR including how the numbers were predicted and where they are located for reference      Expected Outcomes Short Term: Able to state/look up THRR;Short Term: Able to use daily as guideline for intensity in rehab;Long Term: Able to use THRR to govern intensity when exercising independently Short Term: Able to state/look up THRR;Short Term: Able to use  daily as guideline for intensity in rehab;Long Term: Able to use THRR to govern intensity when exercising independently      Understanding of Exercise Prescription Yes Yes      Intervention Provide education, explanation, and written  materials on patient's individual exercise prescription Provide education, explanation, and written materials on patient's individual exercise prescription      Expected Outcomes Short Term: Able to explain program exercise prescription;Long Term: Able to explain home exercise prescription to exercise independently Short Term: Able to explain program exercise prescription;Long Term: Able to explain home exercise prescription to exercise independently               Exercise Goals Re-Evaluation :  Exercise Goals Re-Evaluation     Row Name 12/10/22 1134             Exercise Goal Re-Evaluation   Exercise Goals Review Increase Physical Activity;Able to understand and use Dyspnea scale;Understanding of Exercise Prescription;Increase Strength and Stamina;Knowledge and understanding of Target Heart Rate Range (THRR);Able to understand and use rate of perceived exertion (RPE) scale       Comments Victoria [name] has conpleted 2 exercises sessions. She exercises for 30 min on the Nustep. Victoria [name] averages 1.5 METs at level 1 on the Nustep. She performs the warmup and cooldown mostly seated due to orthopedic limitaitons and deconditioning. Victoria [name] is very deconditioned. It is too soon to note any discernable progressions. Will continue to monitor and progress as able.       Expected Outcomes Through exercise at rehab and home, the patient will decrease shortness of breath with daily activities and feel confident in carrying out an exercise regimen at home.                Discharge Exercise Prescription (Final Exercise Prescription Changes):  Exercise Prescription Changes - 12/21/22 1200       Response to Exercise   Blood Pressure (Admit) 130/54    Blood Pressure (Exercise)  140/50    Blood Pressure (Exit) 130/50    Heart Rate (Admit) 83 bpm    Heart Rate (Exercise) 87 bpm    Heart Rate (Exit) 81 bpm    Oxygen Saturation (Admit) 97 %    Oxygen Saturation (Exercise) 96 %    Oxygen Saturation (Exit) 98 %    Rating of Perceived Exertion (Exercise) 11    Perceived Dyspnea (Exercise) 1    Duration Progress to 30 minutes of  aerobic without signs/symptoms of physical distress    Intensity THRR unchanged      Progression   Progression Continue to progress workloads to maintain intensity without signs/symptoms of physical distress.      Resistance Training   Training Prescription Yes    Weight red bands    Reps 10-15    Time 10 Minutes      Oxygen   Oxygen Continuous    Liters 2      NuStep   Level 2    Minutes 30    METs 1.5      Oxygen   Maintain Oxygen Saturation 88% or higher             Nutrition:  Target Goals: Understanding of nutrition guidelines, daily intake of sodium 1500mg , cholesterol 200mg , calories 30% from fat and 7% or less from saturated fats, daily to have 5 or more servings of fruits and vegetables.  Biometrics:    Nutrition Therapy Plan and Nutrition Goals:  Nutrition Therapy & Goals - 12/02/22 1147       Nutrition Therapy   Diet Heart Healthy Diet    Drug/Food Interactions Statins/Certain Fruits      Personal Nutrition Goals   Nutrition Goal Patient to improve diet quality by using the plate method as  a guide for meal planning to include lean protein/plant protein, fruits, vegetables, whole grains, nonfat dairy as part of a well-balanced diet    Personal Goal #2 Patient to identify strategies for weight loss of 0.5-2.0# per week.    Comments Goals in progress. Victoria [name] has met with an outside dietitian (08/23/2022) to aid with improving eating habits and weight loss efforts (210# at that appointment). She is down ~10# since April of 2024. Victoria [name] will continue to benefit from participation in pulmonary rehab for  nutrition, exercise, and lifestyle modification.      Intervention Plan   Intervention Prescribe, educate and counsel regarding individualized specific dietary modifications aiming towards targeted core components such as weight, hypertension, lipid management, diabetes, heart failure and other comorbidities.;Nutrition handout(s) given to patient.    Expected Outcomes Short Term Goal: Understand basic principles of dietary content, such as calories, fat, sodium, cholesterol and nutrients.;Long Term Goal: Adherence to prescribed nutrition plan.             Nutrition Assessments:  MEDIFICTS Score Key: ?70 Need to make dietary changes  40-70 Heart Healthy Diet ? 40 Therapeutic Level Cholesterol Diet   Picture Your Plate Scores: <16 Unhealthy dietary pattern with much room for improvement. 41-50 Dietary pattern unlikely to meet recommendations for good health and room for improvement. 51-60 More healthful dietary pattern, with some room for improvement.  >60 Healthy dietary pattern, although there may be some specific behaviors that could be improved.    Nutrition Goals Re-Evaluation:  Nutrition Goals Re-Evaluation     Row Name 12/02/22 1147             Goals   Current Weight 200 lb 2.8 oz (90.8 kg)       Comment A1c 7.2, lipids WNL       Expected Outcome Goals in progress. Jayelynn has met with an outside dietitian (08/23/2022) to aid with improving eating habits and weight loss efforts (210# at that appointment). She is down ~10# since April of 2024. Victoria [name] will continue to benefit from participation in pulmonary rehab for nutrition, exercise, and lifestyle modification.                Nutrition Goals Discharge (Final Nutrition Goals Re-Evaluation):  Nutrition Goals Re-Evaluation - 12/02/22 1147       Goals   Current Weight 200 lb 2.8 oz (90.8 kg)    Comment A1c 7.2, lipids WNL    Expected Outcome Goals in progress. Victoria [name] has met with an outside dietitian (08/23/2022)  to aid with improving eating habits and weight loss efforts (210# at that appointment). She is down ~10# since April of 2024. Victoria [name] will continue to benefit from participation in pulmonary rehab for nutrition, exercise, and lifestyle modification.             Psychosocial: Target Goals: Acknowledge presence or absence of significant depression and/or stress, maximize coping skills, provide positive support system. Participant is able to verbalize types and ability to use techniques and skills needed for reducing stress and depression.  Initial Review & Psychosocial Screening:  Initial Psych Review & Screening - 11/24/22 1149       Initial Review   Current issues with Current Stress Concerns    Source of Stress Concerns Family    Comments One brother has dementia, other brother type 1 diabetes. Both have been recently ill.      Family Dynamics   Good Support System? Yes    Comments Pt has supportive son and daughter-in-law &  many church members      Barriers   Psychosocial barriers to participate in program The patient should benefit from training in stress management and relaxation.      Screening Interventions   Interventions Encouraged to exercise    Expected Outcomes Long Term Goal: Stressors or current issues are controlled or eliminated.;Long Term goal: The participant improves quality of Life and PHQ9 Scores as seen by post scores and/or verbalization of changes             Quality of Life Scores:  Scores of 19 and below usually indicate a poorer quality of life in these areas.  A difference of  2-3 points is a clinically meaningful difference.  A difference of 2-3 points in the total score of the Quality of Life Index has been associated with significant improvement in overall quality of life, self-image, physical symptoms, and general health in studies assessing change in quality of life.  PHQ-9: Review Flowsheet  More data exists      11/24/2022 08/19/2022  06/02/2022 05/29/2021 02/20/2020  Depression screen PHQ 2/9  Decreased Interest 1 0 0 0 0  Down, Depressed, Hopeless 0 0 0 0 1  PHQ - 2 Score 1 0 0 0 1  Altered sleeping 0 - - - -  Tired, decreased energy 1 - - - -  Change in appetite 0 - - - -  Feeling bad or failure about yourself  0 - - - -  Trouble concentrating 0 - - - -  Moving slowly or fidgety/restless 1 - - - -  Suicidal thoughts 0 - - - -  PHQ-9 Score 3 - - - -  Difficult doing work/chores Not difficult at all - - - -    Details           Interpretation of Total Score  Total Score Depression Severity:  1-4 = Minimal depression, 5-9 = Mild depression, 10-14 = Moderate depression, 15-19 = Moderately severe depression, 20-27 = Severe depression   Psychosocial Evaluation and Intervention:  Psychosocial Evaluation - 11/24/22 1149       Psychosocial Evaluation & Interventions   Interventions Encouraged to exercise with the program and follow exercise prescription;Stress management education    Comments To decrease stress related to family illnesses    Expected Outcomes For Victoria [name] to participate in PR without any psychosocial barriers or concerns    Continue Psychosocial Services  Follow up required by staff             Psychosocial Re-Evaluation:  Psychosocial Re-Evaluation     Row Name 12/17/22 1049             Psychosocial Re-Evaluation   Current issues with Current Stress Concerns       Comments Victoria [name] continues to worry about her brother's declining health. She understands that there is not much she can do, but still worries. She denies any new psychoscoial barriers or concerns.       Expected Outcomes For Victoria [name] to participate in PR free of any psychosocial barriers or concerns.       Interventions Stress management education;Encouraged to attend Pulmonary Rehabilitation for the exercise       Continue Psychosocial Services  No Follow up required                Psychosocial Discharge (Final  Psychosocial Re-Evaluation):  Psychosocial Re-Evaluation - 12/17/22 1049       Psychosocial Re-Evaluation   Current issues with Current Stress Concerns  Comments Kimberly continues to worry about her brother's declining health. She understands that there is not much she can do, but still worries. She denies any new psychoscoial barriers or concerns.    Expected Outcomes For Victoria [name] to participate in PR free of any psychosocial barriers or concerns.    Interventions Stress management education;Encouraged to attend Pulmonary Rehabilitation for the exercise    Continue Psychosocial Services  No Follow up required             Education: Education Goals: Education classes will be provided on a weekly basis, covering required topics. Participant will state understanding/return demonstration of topics presented.  Learning Barriers/Preferences:  Learning Barriers/Preferences - 11/24/22 1131       Learning Barriers/Preferences   Learning Barriers Sight;Exercise Concerns    Learning Preferences Group Instruction;Individual Instruction;Written Material;Verbal Instruction             Education Topics: Introduction to Pulmonary Rehab Group instruction provided by PowerPoint, verbal discussion, and written material to support subject matter. Instructor reviews what Pulmonary Rehab is, the purpose of the program, and how patients are referred.     Know Your Numbers Group instruction that is supported by a PowerPoint presentation. Instructor discusses importance of knowing and understanding resting, exercise, and post-exercise oxygen saturation, heart rate, and blood pressure. Oxygen saturation, heart rate, blood pressure, rating of perceived exertion, and dyspnea are reviewed along with a normal range for these values.    Exercise for the Pulmonary Patient Group instruction that is supported by a PowerPoint presentation. Instructor discusses benefits of exercise, core components of  exercise, frequency, duration, and intensity of an exercise routine, importance of utilizing pulse oximetry during exercise, safety while exercising, and options of places to exercise outside of rehab.       MET Level  Group instruction provided by PowerPoint, verbal discussion, and written material to support subject matter. Instructor reviews what METs are and how to increase METs.    Pulmonary Medications Verbally interactive group education provided by instructor with focus on inhaled medications and proper administration.   Anatomy and Physiology of the Respiratory System Group instruction provided by PowerPoint, verbal discussion, and written material to support subject matter. Instructor reviews respiratory cycle and anatomical components of the respiratory system and their functions. Instructor also reviews differences in obstructive and restrictive respiratory diseases with examples of each.    Oxygen Safety Group instruction provided by PowerPoint, verbal discussion, and written material to support subject matter. There is an overview of "What is Oxygen" and "Why do we need it".  Instructor also reviews how to create a safe environment for oxygen use, the importance of using oxygen as prescribed, and the risks of noncompliance. There is a brief discussion on traveling with oxygen and resources the patient may utilize. Flowsheet Row PULMONARY REHAB CHRONIC OBSTRUCTIVE PULMONARY DISEASE from 12/02/2022 in [facility] for Heart, Vascular, & Lung Health  Date 12/02/22  Educator RN  Instruction Review Code 1- Verbalizes Understanding       Oxygen Use Group instruction provided by PowerPoint, verbal discussion, and written material to discuss how supplemental oxygen is prescribed and different types of oxygen supply systems. Resources for more information are provided.    Breathing Techniques Group instruction that is supported by demonstration and  informational handouts. Instructor discusses the benefits of pursed lip and diaphragmatic breathing and detailed demonstration on how to perform both.     Risk Factor Reduction Group instruction that is supported by a PowerPoint presentation.  Instructor discusses the definition of a risk factor, different risk factors for pulmonary disease, and how the heart and lungs work together.   MD Day A group question and answer session with a medical doctor that allows participants to ask questions that relate to their pulmonary disease state.   Nutrition for the Pulmonary Patient Group instruction provided by PowerPoint slides, verbal discussion, and written materials to support subject matter. The instructor gives an explanation and review of healthy diet recommendations, which includes a discussion on weight management, recommendations for fruit and vegetable consumption, as well as protein, fluid, caffeine, fiber, sodium, sugar, and alcohol. Tips for eating when patients are short of breath are discussed. Flowsheet Row PULMONARY REHAB CHRONIC OBSTRUCTIVE PULMONARY DISEASE from 04/22/2016 in [facility] for Heart, Vascular, & Lung Health  Date 03/25/16  [facility] Eating During the Holiday]  Educator RD  Instruction Review Code (Retired) 2- meets goals/outcomes        Other Education Group or individual verbal, written, or video instructions that support the educational goals of the pulmonary rehab program.    Knowledge Questionnaire Score:  Knowledge Questionnaire Score - 11/24/22 1358       Knowledge Questionnaire Score   Pre Score 14/18             Core Components/Risk Factors/Patient Goals at Admission:  Personal Goals and Risk Factors at Admission - 11/24/22 1150       Core Components/Risk Factors/Patient Goals on Admission    Weight Management Yes    Intervention Weight Management: Develop a combined nutrition and exercise program designed to reach  desired caloric intake, while maintaining appropriate intake of nutrient and fiber, sodium and fats, and appropriate energy expenditure required for the weight goal.;Weight Management: Provide education and appropriate resources to help participant work on and attain dietary goals.;Weight Management/Obesity: Establish reasonable short term and long term weight goals.;Obesity: Provide education and appropriate resources to help participant work on and attain dietary goals.    Expected Outcomes Short Term: Continue to assess and modify interventions until short term weight is achieved;Long Term: Adherence to nutrition and physical activity/exercise program aimed toward attainment of established weight goal;Weight Loss: Understanding of general recommendations for a balanced deficit meal plan, which promotes 1-2 lb weight loss per week and includes a negative energy balance of [phone] kcal/d;Understanding recommendations for meals to include 15-35% energy as protein, 25-35% energy from fat, 35-60% energy from carbohydrates, less than 200mg  of dietary cholesterol, 20-35 gm of total fiber daily;Understanding of distribution of calorie intake throughout the day with the consumption of 4-5 meals/snacks    Improve shortness of breath with ADL's Yes    Intervention Provide education, individualized exercise plan and daily activity instruction to help decrease symptoms of SOB with activities of daily living.    Expected Outcomes Short Term: Improve cardiorespiratory fitness to achieve a reduction of symptoms when performing ADLs;Long Term: Be able to perform more ADLs without symptoms or delay the onset of symptoms    Increase knowledge of respiratory medications and ability to use respiratory devices properly  Yes    Intervention Provide education and demonstration as needed of appropriate use of medications, inhalers, and oxygen therapy.    Expected Outcomes Short Term: Achieves understanding of medications use.  Understands that oxygen is a medication prescribed by physician. Demonstrates appropriate use of inhaler and oxygen therapy.;Long Term: Maintain appropriate use of medications, inhalers, and oxygen therapy.  Core Components/Risk Factors/Patient Goals Review:   Goals and Risk Factor Review     Row Name 12/17/22 1053             Core Components/Risk Factors/Patient Goals Review   Personal Goals Review Weight Management/Obesity;Improve shortness of breath with ADL's;Develop more efficient breathing techniques such as purse lipped breathing and diaphragmatic breathing and practicing self-pacing with activity.;Increase knowledge of respiratory medications and ability to use respiratory devices properly.       Review Goal progressing for weight loss. Oral has been working with our dietician for weight loss goals. Goal progressing on improving her shortness of breath with ADLs. Goal progressing on developing more efficient breathing techniques such as purse lipped breathing and diaphragmatic breathing; and practicing self-pacing with activity. Goal progressing for increasing knowledge of respiratory medications and ability to use respiratory devices properly.       Expected Outcomes See admission goals                Core Components/Risk Factors/Patient Goals at Discharge (Final Review):   Goals and Risk Factor Review - 12/17/22 1053       Core Components/Risk Factors/Patient Goals Review   Personal Goals Review Weight Management/Obesity;Improve shortness of breath with ADL's;Develop more efficient breathing techniques such as purse lipped breathing and diaphragmatic breathing and practicing self-pacing with activity.;Increase knowledge of respiratory medications and ability to use respiratory devices properly.    Review Goal progressing for weight loss. Shaunie has been working with our dietician for weight loss goals. Goal progressing on improving her shortness of breath with  ADLs. Goal progressing on developing more efficient breathing techniques such as purse lipped breathing and diaphragmatic breathing; and practicing self-pacing with activity. Goal progressing for increasing knowledge of respiratory medications and ability to use respiratory devices properly.    Expected Outcomes See admission goals             ITP Comments:Pt is making expected progress toward Pulmonary Rehab goals after completing 6 sessions. Recommend continued exercise, life style modification, education, and utilization of breathing techniques to increase stamina and strength, while also decreasing shortness of breath with exertion.  Dr. Mechele Collin is Medical Director for Pulmonary Rehab at [facility].     Comments: Dr. Mechele Collin is Medical Director for Pulmonary Rehab at [facility].

## 2022-12-23 ENCOUNTER — Encounter (HOSPITAL_COMMUNITY): Payer: 59

## 2022-12-23 ENCOUNTER — Other Ambulatory Visit (HOSPITAL_COMMUNITY): Payer: Self-pay

## 2022-12-28 ENCOUNTER — Encounter (HOSPITAL_COMMUNITY)
Admission: RE | Admit: 2022-12-28 | Discharge: 2022-12-28 | Disposition: A | Discharge status: Home / Self Care | Payer: 59 | Source: Ambulatory Visit | Attending: Internal Medicine | Admitting: Internal Medicine

## 2022-12-28 DIAGNOSIS — J439 Emphysema, unspecified: Secondary | ICD-10-CM | POA: Diagnosis not present

## 2022-12-28 DIAGNOSIS — J449 Chronic obstructive pulmonary disease, unspecified: Secondary | ICD-10-CM

## 2022-12-28 NOTE — Progress Notes (Signed)
Daily Session Note  Patient Details  Name: Victoria [name] MRN: [id number] Date of Birth: Jun 19, 1945 Referring Provider:   Doristine Devoid Pulmonary Rehab Walk Test from 11/24/2022 in [facility] for Heart, Vascular, & Lung Health  Referring Provider Ramaswamy       Encounter Date: 12/28/2022  Check In:  Session Check In - 12/28/22 1106       Check-In   Supervising physician immediately available to respond to emergencies [facility] MD immediately available    Physician(s) Jari Favre, PA    Location [facility]    Staff Present Raford Pitcher, MS, ACSM-CEP, Exercise Physiologist;Randi Dionisio Paschal, ACSM-CEP, Exercise Physiologist;Samantha Belarus, RD, Dutch Gray, RN, Fuller Plan, RT    Virtual Visit No    Medication changes reported     No    Fall or balance concerns reported    No    Tobacco Cessation No Change    Warm-up and Cool-down Performed as group-led instruction    Resistance Training Performed Yes    VAD Patient? No    PAD/SET Patient? No      Pain Assessment   Currently in Pain? No/denies    Multiple Pain Sites No             Capillary Blood Glucose: No results found. However, due to the size of the patient record, not all encounters were searched. Please check Results Review for a complete set of results.    Social History   Tobacco Use  Smoking Status Former   Current packs/day: 0.00   Average packs/day: 1 pack/day for 50.0 years (50.0 ttl pk-yrs)   Types: Cigarettes   Start date: 04/15/1961   Quit date: 04/16/2011   Years since quitting: 11.7  Smokeless Tobacco Never  Tobacco Comments   quit that date when she had to go to ER     Goals Met:  Exercise tolerated well No report of concerns or symptoms today Strength training completed today  Goals Unmet:  Not Applicable  Comments: Service time is from 1007 to 1132    Dr. Mechele Collin is Medical Director for Pulmonary Rehab at [facility].

## 2022-12-29 ENCOUNTER — Other Ambulatory Visit: Payer: Self-pay | Admitting: Internal Medicine

## 2022-12-29 DIAGNOSIS — M839 Adult osteomalacia, unspecified: Secondary | ICD-10-CM

## 2022-12-30 ENCOUNTER — Encounter (HOSPITAL_COMMUNITY)
Admission: RE | Admit: 2022-12-30 | Discharge: 2022-12-30 | Disposition: A | Discharge status: Home / Self Care | Payer: 59 | Source: Ambulatory Visit | Attending: Internal Medicine | Admitting: Internal Medicine

## 2022-12-30 ENCOUNTER — Other Ambulatory Visit (HOSPITAL_COMMUNITY): Payer: Self-pay

## 2022-12-30 DIAGNOSIS — J449 Chronic obstructive pulmonary disease, unspecified: Secondary | ICD-10-CM

## 2022-12-30 DIAGNOSIS — J439 Emphysema, unspecified: Secondary | ICD-10-CM | POA: Diagnosis not present

## 2022-12-30 NOTE — Progress Notes (Signed)
Daily Session Note  Patient Details  Name: Victoria [name] MRN: [id number] Date of Birth: [birth year] Referring Provider:   Doristine Devoid Pulmonary Rehab Walk Test from 11/24/2022 in [facility] for Heart, Vascular, & Lung Health  Referring Provider Ramaswamy       Encounter Date: 12/30/2022  Check In:  Session Check In - 12/30/22 1209       Check-In   Supervising physician immediately available to respond to emergencies [facility] MD immediately available    Physician(s) Edd Fabian, NP    Location [facility]    Staff Present Raford Pitcher, MS, ACSM-CEP, Exercise Physiologist;Randi Dionisio Paschal, ACSM-CEP, Exercise Physiologist;Samantha Belarus, RD, Dutch Gray, RN, Fuller Plan, RT    Virtual Visit No    Medication changes reported     No    Fall or balance concerns reported    No    Tobacco Cessation No Change    Warm-up and Cool-down Performed as group-led instruction    Resistance Training Performed Yes    VAD Patient? No    PAD/SET Patient? No      Pain Assessment   Currently in Pain? No/denies    Pain Score 0-No pain    Multiple Pain Sites No             Capillary Blood Glucose: No results found. However, due to the size of the patient record, not all encounters were searched. Please check Results Review for a complete set of results.    Social History   Tobacco Use  Smoking Status Former   Current packs/day: 0.00   Average packs/day: 1 pack/day for 50.0 years (50.0 ttl pk-yrs)   Types: Cigarettes   Start date: 04/15/1961   Quit date: 04/16/2011   Years since quitting: 11.7  Smokeless Tobacco Never  Tobacco Comments   quit that date when she had to go to ER     Goals Met:  Proper associated with RPD/PD & O2 Sat Independence with exercise equipment Exercise tolerated well No report of concerns or symptoms today Strength training completed today  Goals Unmet:  Not Applicable  Comments: Service time is  from 1015 to 1141.    Dr. Mechele Collin is Medical Director for Pulmonary Rehab at [facility].

## 2023-01-04 ENCOUNTER — Encounter (HOSPITAL_COMMUNITY)
Admission: RE | Admit: 2023-01-04 | Discharge: 2023-01-04 | Disposition: A | Discharge status: Home / Self Care | Payer: 59 | Source: Ambulatory Visit | Attending: Internal Medicine | Admitting: Internal Medicine

## 2023-01-04 VITALS — Wt 196.7 lb

## 2023-01-04 DIAGNOSIS — J449 Chronic obstructive pulmonary disease, unspecified: Secondary | ICD-10-CM

## 2023-01-04 DIAGNOSIS — J439 Emphysema, unspecified: Secondary | ICD-10-CM | POA: Diagnosis not present

## 2023-01-04 NOTE — Progress Notes (Signed)
Daily Session Note  Patient Details  Name: Victoria [name] MRN: [id number] Date of Birth: Nov 26, 1945 Referring Provider:   Doristine Devoid Pulmonary Rehab Walk Test from 11/24/2022 in [facility] for Heart, Vascular, & Lung Health  Referring Provider Ramaswamy       Encounter Date: 01/04/2023  Check In:  Session Check In - 01/04/23 1112       Check-In   Supervising physician immediately available to respond to emergencies [facility] MD immediately available    Physician(s) Edd Fabian, NP    Location [facility]    Staff Present Raford Pitcher, MS, ACSM-CEP, Exercise Physiologist;Samantha Belarus, RD, Dutch Gray, RN, Fuller Plan, RT    Virtual Visit No    Medication changes reported     No    Fall or balance concerns reported    No    Tobacco Cessation No Change    Warm-up and Cool-down Performed as group-led instruction    Resistance Training Performed Yes    VAD Patient? No    PAD/SET Patient? No      Pain Assessment   Currently in Pain? No/denies    Multiple Pain Sites No             Capillary Blood Glucose: No results found. However, due to the size of the patient record, not all encounters were searched. Please check Results Review for a complete set of results.   Exercise Prescription Changes - 01/04/23 1200       Response to Exercise   Blood Pressure (Admit) 142/64    Blood Pressure (Exercise) 160/66    Blood Pressure (Exit) 164/68    Heart Rate (Admit) 79 bpm    Heart Rate (Exercise) 82 bpm    Heart Rate (Exit) 78 bpm    Oxygen Saturation (Admit) 97 %    Oxygen Saturation (Exercise) 98 %    Oxygen Saturation (Exit) 100 %    Rating of Perceived Exertion (Exercise) 11    Perceived Dyspnea (Exercise) 1    Duration Continue with 30 min of aerobic exercise without signs/symptoms of physical distress.    Intensity THRR unchanged      Progression   Progression Continue to progress workloads to maintain  intensity without signs/symptoms of physical distress.      Resistance Training   Training Prescription Yes    Weight red bands    Reps 10-15    Time 10 Minutes      Oxygen   Oxygen Continuous    Liters 2      NuStep   Level 2    Minutes 30    METs 1.5      Oxygen   Maintain Oxygen Saturation 88% or higher             Social History   Tobacco Use  Smoking Status Former   Current packs/day: 0.00   Average packs/day: 1 pack/day for 50.0 years (50.0 ttl pk-yrs)   Types: Cigarettes   Start date: 04/15/1961   Quit date: 04/16/2011   Years since quitting: 11.7  Smokeless Tobacco Never  Tobacco Comments   quit that date when she had to go to ER     Goals Met:  Proper associated with RPD/PD & O2 Sat Independence with exercise equipment Exercise tolerated well No report of concerns or symptoms today Strength training completed today  Goals Unmet:  Not Applicable  Comments: Service time is from 1018 to 1145.    Dr. Mechele Collin  is Wellsite geologist for Pulmonary Rehab at [facility].

## 2023-01-06 ENCOUNTER — Encounter (HOSPITAL_COMMUNITY)
Admission: RE | Admit: 2023-01-06 | Discharge: 2023-01-06 | Disposition: A | Discharge status: Home / Self Care | Payer: 59 | Source: Ambulatory Visit | Attending: Internal Medicine | Admitting: Internal Medicine

## 2023-01-06 DIAGNOSIS — J449 Chronic obstructive pulmonary disease, unspecified: Secondary | ICD-10-CM

## 2023-01-06 DIAGNOSIS — J439 Emphysema, unspecified: Secondary | ICD-10-CM | POA: Diagnosis not present

## 2023-01-06 NOTE — Progress Notes (Signed)
Daily Session Note  Patient Details  Name: Victoria [name] MRN: [id number] Date of Birth: [birth year] Referring Provider:   Doristine Devoid Pulmonary Rehab Walk Test from 11/24/2022 in [facility]  Referring Provider Ramaswamy       Encounter Date: 01/06/2023  Check In:  Session Check In - 01/06/23 1101       Check-In   Supervising physician immediately available to respond to emergencies [facility] MD immediately available    Physician(s) Neila Gear, NP    Location [facility]    Staff Present Raford Pitcher, MS, ACSM-CEP, Exercise Physiologist;Samantha Belarus, RD, Dutch Gray, RN, BSN;Casey Smith, RT;Randi Reeve BS, ACSM-CEP, Exercise Physiologist    Virtual Visit No    Medication changes reported     No    Fall or balance concerns reported    No    Tobacco Cessation No Change    Warm-up and Cool-down Performed as group-led instruction    Resistance Training Performed Yes    VAD Patient? No    PAD/SET Patient? No      Pain Assessment   Currently in Pain? No/denies    Pain Score 0-No pain    Multiple Pain Sites No             Capillary Blood Glucose: No results found. However, due to the size of the patient record, not all encounters were searched. Please check Results Review for a complete set of results.    Social History   Tobacco Use  Smoking Status Former   Current packs/day: 0.00   Average packs/day: 1 pack/day for 50.0 years (50.0 ttl pk-yrs)   Types: Cigarettes   Start date: 04/15/1961   Quit date: 04/16/2011   Years since quitting: 11.7  Smokeless Tobacco Never  Tobacco Comments   quit that date when she had to go to ER     Goals Met:  Exercise tolerated well No report of concerns or symptoms today Strength training completed today  Goals Unmet:  Not Applicable  Comments: Service time is from 1007 to 1133    Dr. Mechele Collin is Medical Director for Pulmonary  Rehab at [facility].

## 2023-01-11 ENCOUNTER — Encounter (HOSPITAL_COMMUNITY)
Admission: RE | Admit: 2023-01-11 | Discharge: 2023-01-11 | Disposition: A | Discharge status: Home / Self Care | Payer: 59 | Source: Ambulatory Visit | Attending: Internal Medicine | Admitting: Internal Medicine

## 2023-01-11 ENCOUNTER — Other Ambulatory Visit: Payer: Self-pay | Admitting: Internal Medicine

## 2023-01-11 DIAGNOSIS — J449 Chronic obstructive pulmonary disease, unspecified: Secondary | ICD-10-CM

## 2023-01-11 DIAGNOSIS — J439 Emphysema, unspecified: Secondary | ICD-10-CM | POA: Diagnosis not present

## 2023-01-11 NOTE — Progress Notes (Signed)
Daily Session Note  Patient Details  Name: Victoria [name] MRN: [id number] Date of Birth: September 01, 1945 Referring Provider:   Doristine Devoid Pulmonary Rehab Walk Test from 11/24/2022 in [facility]  Referring Provider Ramaswamy       Encounter Date: 01/11/2023  Check In:  Session Check In - 01/11/23 1222       Check-In   Supervising physician immediately available to respond to emergencies [facility] MD immediately available    Physician(s) Carlyon Shadow, NP    Location [facility]    Staff Present Samantha Belarus, RD, Dutch Gray, RN, BSN;Randi Reeve BS, ACSM-CEP, Exercise Physiologist;Kaylee Earlene Plater, MS, ACSM-CEP, Exercise Physiologist;Ashleynicole Mcclees Katrinka Blazing, RT    Virtual Visit No    Medication changes reported     No    Fall or balance concerns reported    No    Tobacco Cessation No Change    Warm-up and Cool-down Performed as group-led instruction    Resistance Training Performed Yes    VAD Patient? No    PAD/SET Patient? No      Pain Assessment   Currently in Pain? No/denies    Multiple Pain Sites No             Capillary Blood Glucose: No results found. However, due to the size of the patient record, not all encounters were searched. Please check Results Review for a complete set of results.    Social History   Tobacco Use  Smoking Status Former   Current packs/day: 0.00   Average packs/day: 1 pack/day for 50.0 years (50.0 ttl pk-yrs)   Types: Cigarettes   Start date: 04/15/1961   Quit date: 04/16/2011   Years since quitting: 11.7  Smokeless Tobacco Never  Tobacco Comments   quit that date when she had to go to ER     Goals Met:  Proper associated with RPD/PD & O2 Sat Independence with exercise equipment Exercise tolerated well No report of concerns or symptoms today Strength training completed today  Goals Unmet:  Not Applicable  Comments: Service time is from 1009 to  1140.    Dr. Mechele Collin is Medical Director for Pulmonary Rehab at [facility].

## 2023-01-13 ENCOUNTER — Encounter (HOSPITAL_COMMUNITY)
Admission: RE | Admit: 2023-01-13 | Discharge: 2023-01-13 | Disposition: A | Discharge status: Home / Self Care | Payer: 59 | Source: Ambulatory Visit | Attending: Internal Medicine | Admitting: Internal Medicine

## 2023-01-13 VITALS — Wt 197.3 lb

## 2023-01-13 DIAGNOSIS — J449 Chronic obstructive pulmonary disease, unspecified: Secondary | ICD-10-CM

## 2023-01-13 DIAGNOSIS — J439 Emphysema, unspecified: Secondary | ICD-10-CM | POA: Diagnosis not present

## 2023-01-13 NOTE — Progress Notes (Signed)
Daily Session Note  Patient Details  Name: Victoria [name] MRN: [id number] Date of Birth: 27-Dec-1945 Referring Provider:   Doristine Devoid Pulmonary Rehab Walk Test from 11/24/2022 in [facility]  Referring Provider Ramaswamy       Encounter Date: 01/13/2023  Check In:  Session Check In - 01/13/23 1511       Check-In   Supervising physician immediately available to respond to emergencies [facility] MD immediately available    Physician(s) Bernadene Person, NP    [facility]    Staff Present Elissa Lovett BS, ACSM-CEP, Exercise Physiologist;Kaylee Earlene Plater, MS, ACSM-CEP, Exercise Physiologist;Casey Thedore Mins, RN, BSN    Virtual Visit No    Medication changes reported     No    Fall or balance concerns reported    No    Tobacco Cessation No Change    Warm-up and Cool-down Performed as group-led instruction    Resistance Training Performed Yes    VAD Patient? No    PAD/SET Patient? No      Pain Assessment   Currently in Pain? No/denies             Capillary Blood Glucose: No results found. However, due to the size of the patient record, not all encounters were searched. Please check Results Review for a complete set of results.    Social History   Tobacco Use  Smoking Status Former   Current packs/day: 0.00   Average packs/day: 1 pack/day for 50.0 years (50.0 ttl pk-yrs)   Types: Cigarettes   Start date: 04/15/1961   Quit date: 04/16/2011   Years since quitting: 11.7  Smokeless Tobacco Never  Tobacco Comments   quit that date when she had to go to ER     Goals Met:  Exercise tolerated well No report of concerns or symptoms today Strength training completed today  Goals Unmet:  Not Applicable  Comments: Service time is from 1313 to 1445.    Dr. Mechele Collin is Medical Director for Pulmonary Rehab at [facility].

## 2023-01-18 ENCOUNTER — Encounter (HOSPITAL_COMMUNITY): Payer: 59

## 2023-01-19 ENCOUNTER — Other Ambulatory Visit: Payer: Self-pay | Admitting: Internal Medicine

## 2023-01-19 NOTE — Progress Notes (Signed)
Pulmonary Individual Treatment Plan  Patient Details  Name: Victoria [name] MRN: [id number] Date of Birth: Apr 11, 1946 Referring Provider:   Doristine Devoid Pulmonary Rehab Walk Test from 11/24/2022 in [facility]  Referring Provider Ramaswamy       Initial Encounter Date:  Flowsheet Row Pulmonary Rehab Walk Test from 11/24/2022 in [facility]  Date 11/24/22       Visit Diagnosis: Stage 3 severe COPD by GOLD classification (HCC)  [name] Home Medications on Admission:   Current Outpatient Medications:    Accu-Chek Softclix Lancets lancets, Use as instructed to check blood sugar 3X daily (Patient not taking: Reported on 11/24/2022), Disp: 100 each, Rfl: 1   acetaminophen (TYLENOL) 500 MG tablet, Take 1,000 mg by mouth every 6 (six) hours as needed., Disp: , Rfl:    albuterol (PROVENTIL) (2.5 MG/3ML) 0.083% nebulizer solution, Take 3 mLs (2.5 mg total) by nebulization every 6 (six) hours as needed for wheezing or shortness of breath., Disp: 75 mL, Rfl: 3   Albuterol Sulfate (PROAIR RESPICLICK) 108 (90 Base) MCG/ACT AEPB, Inhale 1-2 puffs into the lungs every 6 (six) hours as needed (for wheezing/shortness of breath)., Disp: 1 each, Rfl: 5   amLODipine (NORVASC) 10 MG tablet, TAKE ONE TABLET BY MOUTH ONCE DAILY, Disp: 30 tablet, Rfl: 0   aspirin EC 81 MG tablet, Take 1 tablet (81 mg total) by mouth daily. (Patient not taking: Reported on 11/24/2022), Disp: 90 tablet, Rfl: 3   Blood Glucose Monitoring Suppl (ACCU-CHEK AVIVA PLUS) w/Device KIT, 1 Device by Does not apply route daily. Use to monitor glucose levels once per day; E11.9, Disp: 1 kit, Rfl: 0   carvedilol (COREG) 6.25 MG tablet, TAKE ONE TABLET BY MOUTH EVERY MORNING and TAKE ONE TABLET BY MOUTH EVERYDAY AT BEDTIME, Disp: 60 tablet, Rfl: 0   Cholecalciferol (VITAMIN D3) 125 MCG (5000 UT) CAPS, TAKE ONE CAPSULE BY  MOUTH ONCE DAILY, Disp: 90 capsule, Rfl: 0   dapagliflozin propanediol (FARXIGA) 10 MG TABS tablet, TAKE ONE TABLET BY MOUTH BEFORE BREAKFAST, Disp: 90 tablet, Rfl: 0   denosumab (PROLIA) 60 MG/ML SOSY injection, Inject 60 mg into the skin every 6 (six) months., Disp: 1 mL, Rfl: 1   Fluticasone-Umeclidin-Vilant (TRELEGY ELLIPTA) 100-62.5-25 MCG/ACT AEPB, INHALE ONE (1) PUFF BY MOUTH AND INTO THE LUNGS DAILY *REFILL REQUEST*, Disp: 60 each, Rfl: 10   glucose blood (ACCU-CHEK AVIVA PLUS) test strip, USE TO Check blood glucose THREE TIMES DAILY, Disp: 300 strip, Rfl: 5   glucose blood (ACCU-CHEK GUIDE) test strip, USE 1 STRIP ONCE DAILY, Disp: 100 each, Rfl: 0   glucose blood test strip, USE 1 STRIP ONCE DAILY, Disp: 100 each, Rfl: 12   methocarbamol (ROBAXIN) 500 MG tablet, Take 1 tablet (500 mg total) by mouth every 8 (eight) hours as needed for muscle spasms., Disp: 30 tablet, Rfl: 0   OXYGEN, Inhale 2-3 L into the lungs continuous. When exerting self, Disp: , Rfl:    potassium chloride SA (KLOR-CON) 20 MEQ tablet, Take 1 tablet (20 mEq total) by mouth 2 (two) times daily. (Patient not taking: Reported on 11/24/2022), Disp: 180 tablet, Rfl: 0   roflumilast (DALIRESP) 500 MCG TABS tablet, TAKE ONE TABLET BY MOUTH EVERY MORNING, Disp: 90 tablet, Rfl: 3   rosuvastatin (CRESTOR) 40 MG tablet, TAKE ONE TABLET BY MOUTH EVERYDAY AT BEDTIME, Disp: 30 tablet, Rfl: 0   SitaGLIPtin-MetFORMIN HCl (JANUMET  XR) 50-1000 MG TB24, Take 2 tablets by mouth daily before supper., Disp: 180 tablet, Rfl: 3   spironolactone (ALDACTONE) 25 MG tablet, TAKE ONE TABLET BY MOUTH ONCE DAILY, Disp: 30 tablet, Rfl: 0   thiamine 100 MG tablet, TAKE ONE TABLET BY MOUTH every other DAY, Disp: 45 tablet, Rfl: 0  Past Medical History: Past Medical History:  Diagnosis Date   Abdominal aneurysm (HCC)    Dr. Darrick Penna follows lLOV 2 ''17 per pt "around 2 cm"   Anemia    as a child   Arthritis    left ankle, right knee, right SI joint,  wrists, lower back   Breast cancer in female [facility])    Left   COPD (chronic obstructive pulmonary disease) (HCC)    ephysema-Dr. 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 Gerrit Friends in [place]   Sleep apnea    cpap used sometimes, uses oxygen concentrator 2 l/m nasally bedtime   Varicella as child    Tobacco Use: Social History   Tobacco Use  Smoking Status Former   Current packs/day: 0.00   Average packs/day: 1 pack/day for 50.0 years (50.0 ttl pk-yrs)   Types: Cigarettes   Start date: 04/15/1961   Quit date: 04/16/2011   Years since quitting: 11.7  Smokeless Tobacco Never  Tobacco Comments   quit that date when she had to go to ER     Labs: Review Flowsheet  More data exists      Latest Ref Rng & Units 02/23/2021 07/22/2021 11/10/2021 03/25/2022 07/19/2022  Labs for ITP Cardiac and Pulmonary Rehab  Cholestrol 100 - 199 mg/dL - - 161  - -  LDL (calc) 0 - 99 mg/dL - - 40  - -  HDL-C >09 mg/dL - - 51  - -  Trlycerides 0 - 149 mg/dL - - 604  - -  Hemoglobin A1c 4.0 - 5.6 % 6.7  7.2  - 7.1  7.2     Details            Capillary Blood Glucose: Lab Results  Component Value Date   GLUCAP 111 (H) 12/07/2022   GLUCAP 125 (H) 12/07/2022   GLUCAP 152 (H) 12/02/2022   GLUCAP 131 (H) 12/02/2022   GLUCAP 108 (H) 11/24/2022    POCT Glucose     Row Name 11/24/22 1141             POCT Blood Glucose   Pre-Exercise 108 mg/dL                Pulmonary Assessment Scores:  Pulmonary Assessment Scores     Row Name 11/24/22 1120         ADL UCSD   SOB Score total 68        CAT Score   CAT Score 24       mMRC Score   mMRC Score 3             UCSD: Self-administered rating  of dyspnea associated with activities of daily living (ADLs) 6-point scale (0 = "not at all" to 5 = "maximal or unable to do because of breathlessness")  Scoring Scores range from 0 to 120.  Minimally important difference is 5 units  CAT: CAT can identify the health impairment of COPD patients and is better correlated with disease progression.  CAT has a scoring range of zero to 40. The CAT score is classified into four groups of low (less than 10), medium (10 - 20), high (21-30) and very high (31-40) based on the impact level of disease on health status. A CAT score over 10 suggests significant symptoms.  A worsening CAT score could be explained by an exacerbation, poor medication adherence, poor inhaler technique, or progression of COPD or comorbid conditions.  CAT MCID is 2 points  mMRC: mMRC (Modified Medical Research Council) Dyspnea Scale is used to assess the degree of baseline functional disability in patients of respiratory disease due to dyspnea. No minimal important difference is established. A decrease in score of 1 point or greater is considered a positive change.   Pulmonary Function Assessment:  Pulmonary Function Assessment - 11/24/22 1120       Breath   Bilateral Breath Sounds Decreased    Shortness of Breath Yes;Fear of Shortness of Breath;Limiting activity             Exercise Target Goals: Exercise Program Goal: Individual exercise prescription set using results from initial 6 min walk test and THRR while considering  patient's activity barriers and safety.   Exercise Prescription Goal: Initial exercise prescription builds to 30-45 minutes a day of aerobic activity, 2-3 days per week.  Home exercise guidelines will be given to patient during program as part of exercise prescription that the participant will acknowledge.  Activity Barriers & Risk  Stratification:  Activity Barriers & Cardiac Risk Stratification - 11/24/22 1059       Activity Barriers & Cardiac Risk Stratification   Activity Barriers Deconditioning;Muscular Weakness;Shortness of Breath;Joint Problems;Balance Concerns             6 Minute Walk:  6 Minute Walk     Row Name 11/24/22 1258         6 Minute Walk   Phase Initial     Distance 427 feet     Walk Time 6 minutes     # of Rest Breaks 2  stopped at 1:10, re-started at 2:30, stopped at 4:12, re-started at 5:20     MPH 0.81     METS 1.05     RPE 15     Perceived Dyspnea  3     VO2 Peak 3.67     Symptoms Yes (comment)     Comments dyspnea     Resting HR 82 bpm     Resting BP 158/78     Resting Oxygen Saturation  92 %     Exercise Oxygen Saturation  during 6 min walk 85 %     Max Ex. HR 105 bpm     Max Ex. BP 192/66     2 Minute Post BP 151/72       Interval HR   1 Minute HR 98     2 Minute HR 93     3 Minute HR 92     4 Minute HR 105     5 Minute HR 95     6 Minute HR 101     2 Minute Post HR 86  Interval Heart Rate? Yes       Interval Oxygen   Interval Oxygen? Yes     Baseline Oxygen Saturation % 92 %     1 Minute Oxygen Saturation % 91 %     1 Minute Liters of Oxygen 0 L     2 Minute Oxygen Saturation % 86 %  O2 dropped to 86% at 1:28     2 Minute Liters of Oxygen 1 L     3 Minute Oxygen Saturation % 85 %     3 Minute Liters of Oxygen 2 L     4 Minute Oxygen Saturation % 94 %     4 Minute Liters of Oxygen 2 L     5 Minute Oxygen Saturation % 97 %     5 Minute Liters of Oxygen 2 L     6 Minute Oxygen Saturation % 97 %     6 Minute Liters of Oxygen 2 L     2 Minute Post Oxygen Saturation % 99 %     2 Minute Post Liters of Oxygen 2 L              Oxygen Initial Assessment:  Oxygen Initial Assessment - 11/24/22 1105       Home Oxygen   Home Oxygen Device Portable Concentrator;Home Concentrator   back up tank if power outage   Sleep Oxygen Prescription Continuous     Liters per minute 2    Home Exercise Oxygen Prescription None    Home Resting Oxygen Prescription Continuous    Liters per minute 2    Compliance with Home Oxygen Use Yes      Initial 6 min Walk   Oxygen Used Continuous    Liters per minute 2      Program Oxygen Prescription   Program Oxygen Prescription Continuous    Liters per minute 2      Intervention   Short Term Goals To learn and exhibit compliance with exercise, home and travel O2 prescription;To learn and understand importance of maintaining oxygen saturations>88%;To learn and demonstrate proper use of respiratory medications;To learn and understand importance of monitoring SPO2 with pulse oximeter and demonstrate accurate use of the pulse oximeter.;To learn and demonstrate proper pursed lip breathing techniques or other breathing techniques.     Long  Term Goals Verbalizes importance of monitoring SPO2 with pulse oximeter and return demonstration;Exhibits proper breathing techniques, such as pursed lip breathing or other method taught during program session;Demonstrates proper use of MDI's;Compliance with respiratory medication;Maintenance of O2 saturations>88%;Exhibits compliance with exercise, home  and travel O2 prescription             Oxygen Re-Evaluation:  Oxygen Re-Evaluation     Row Name 12/10/22 1136 01/12/23 1134           Program Oxygen Prescription   Program Oxygen Prescription Continuous Continuous      Liters per minute 2 2        Home Oxygen   Home Oxygen Device Portable Concentrator;Home Concentrator Portable Concentrator;Home Concentrator      Sleep Oxygen Prescription Continuous Continuous      Liters per minute 2 2      Home Exercise Oxygen Prescription None None      Home Resting Oxygen Prescription Continuous Continuous      Liters per minute 2 2      Compliance with Home Oxygen Use Yes Yes        Goals/Expected Outcomes   Short  Term Goals To learn and exhibit compliance with exercise,  home and travel O2 prescription;To learn and understand importance of maintaining oxygen saturations>88%;To learn and demonstrate proper use of respiratory medications;To learn and understand importance of monitoring SPO2 with pulse oximeter and demonstrate accurate use of the pulse oximeter.;To learn and demonstrate proper pursed lip breathing techniques or other breathing techniques.  To learn and exhibit compliance with exercise, home and travel O2 prescription;To learn and understand importance of maintaining oxygen saturations>88%;To learn and demonstrate proper use of respiratory medications;To learn and understand importance of monitoring SPO2 with pulse oximeter and demonstrate accurate use of the pulse oximeter.;To learn and demonstrate proper pursed lip breathing techniques or other breathing techniques.       Long  Term Goals Verbalizes importance of monitoring SPO2 with pulse oximeter and return demonstration;Exhibits proper breathing techniques, such as pursed lip breathing or other method taught during program session;Demonstrates proper use of MDI's;Compliance with respiratory medication;Maintenance of O2 saturations>88%;Exhibits compliance with exercise, home  and travel O2 prescription Verbalizes importance of monitoring SPO2 with pulse oximeter and return demonstration;Exhibits proper breathing techniques, such as pursed lip breathing or other method taught during program session;Demonstrates proper use of MDI's;Compliance with respiratory medication;Maintenance of O2 saturations>88%;Exhibits compliance with exercise, home  and travel O2 prescription      Goals/Expected Outcomes Compliance and understanding of oxygen saturation monitoring and breathing techniques to decrease shortness of breath. Compliance and understanding of oxygen saturation monitoring and breathing techniques to decrease shortness of breath.               Oxygen Discharge (Final Oxygen Re-Evaluation):  Oxygen  Re-Evaluation - 01/12/23 1134       Program Oxygen Prescription   Program Oxygen Prescription Continuous    Liters per minute 2      Home Oxygen   Home Oxygen Device Portable Concentrator;Home Concentrator    Sleep Oxygen Prescription Continuous    Liters per minute 2    Home Exercise Oxygen Prescription None    Home Resting Oxygen Prescription Continuous    Liters per minute 2    Compliance with Home Oxygen Use Yes      Goals/Expected Outcomes   Short Term Goals To learn and exhibit compliance with exercise, home and travel O2 prescription;To learn and understand importance of maintaining oxygen saturations>88%;To learn and demonstrate proper use of respiratory medications;To learn and understand importance of monitoring SPO2 with pulse oximeter and demonstrate accurate use of the pulse oximeter.;To learn and demonstrate proper pursed lip breathing techniques or other breathing techniques.     Long  Term Goals Verbalizes importance of monitoring SPO2 with pulse oximeter and return demonstration;Exhibits proper breathing techniques, such as pursed lip breathing or other method taught during program session;Demonstrates proper use of MDI's;Compliance with respiratory medication;Maintenance of O2 saturations>88%;Exhibits compliance with exercise, home  and travel O2 prescription    Goals/Expected Outcomes Compliance and understanding of oxygen saturation monitoring and breathing techniques to decrease shortness of breath.             Initial Exercise Prescription:  Initial Exercise Prescription - 11/24/22 1200       Date of Initial Exercise RX and Referring Provider   Date 11/24/22    Referring Provider Ramaswamy    Expected Discharge Date 02/24/23      Oxygen   Oxygen Continuous    Liters 3    Maintain Oxygen Saturation 88% or higher      NuStep   Level 1    SPM  50    Minutes 30    METs 2      Prescription Details   Frequency (times per week) 2    Duration Progress to  30 minutes of continuous aerobic without signs/symptoms of physical distress      Intensity   THRR 40-80% of Max Heartrate 57-114    Ratings of Perceived Exertion 11-13    Perceived Dyspnea 0-4      Progression   Progression Continue progressive overload as per policy without signs/symptoms or physical distress.      Resistance Training   Training Prescription Yes    Weight red bands    Reps 10-15             Perform Capillary Blood Glucose checks as needed.  Exercise Prescription Changes:   Exercise Prescription Changes     Row Name 12/07/22 1200 12/21/22 1200 01/04/23 1200 01/13/23 1218       Response to Exercise   Blood Pressure (Admit) 144/68 130/54 142/64 118/68    Blood Pressure (Exercise) 130/68 140/50 160/66 --    Blood Pressure (Exit) 158/68 130/50 164/68 142/58    Heart Rate (Admit) 84 bpm 83 bpm 79 bpm 76 bpm    Heart Rate (Exercise) 85 bpm 87 bpm 82 bpm 91 bpm    Heart Rate (Exit) 86 bpm 81 bpm 78 bpm 80 bpm    Oxygen Saturation (Admit) 98 % 97 % 97 % 99 %    Oxygen Saturation (Exercise) 97 % 96 % 98 % 95 %    Oxygen Saturation (Exit) 97 % 98 % 100 % 100 %    Rating of Perceived Exertion (Exercise) 11 11 11 13     Perceived Dyspnea (Exercise) 1 1 1 2     Duration Progress to 30 minutes of  aerobic without signs/symptoms of physical distress Progress to 30 minutes of  aerobic without signs/symptoms of physical distress Continue with 30 min of aerobic exercise without signs/symptoms of physical distress. Continue with 30 min of aerobic exercise without signs/symptoms of physical distress.    Intensity THRR unchanged THRR unchanged THRR unchanged THRR unchanged      Progression   Progression Continue to progress workloads to maintain intensity without signs/symptoms of physical distress. Continue to progress workloads to maintain intensity without signs/symptoms of physical distress. Continue to progress workloads to maintain intensity without signs/symptoms of  physical distress. Continue to progress workloads to maintain intensity without signs/symptoms of physical distress.      Resistance Training   Training Prescription Yes Yes Yes Yes    Weight red bands red bands red bands red bands    Reps 10-15 10-15 10-15 10-15    Time 10 Minutes 10 Minutes 10 Minutes 10 Minutes      Oxygen   Oxygen Continuous Continuous Continuous Continuous    Liters 3 2 2 2       NuStep   Level 1 2 2 3     Minutes 30 30 30 15     METs 1.5 1.5 1.5 1.9      Track   Laps -- -- -- 6    Minutes -- -- -- 13    METs -- -- -- 1.92      Oxygen   Maintain Oxygen Saturation 88% or higher 88% or higher 88% or higher 88% or higher             Exercise Comments:   Exercise Comments     Row Name 12/02/22 1133  Exercise Comments Pt completed first day of exercise. Victoria [name] exercised for 15 min on the Nustep. She tried to transition the arm ergometer due to her chronic pain. Victoria [name] did not tolerate the arm ergometer well as she was transitioned back to the Nustep. Victoria [name] is very deconditioned as she did not exercise inconsistently. Discussed METs and how to increase METs.                Exercise Goals and Review:   Exercise Goals     Row Name 11/24/22 1103 12/10/22 1133 01/12/23 1132         Exercise Goals   Increase Physical Activity Yes Yes Yes     Intervention Provide advice, education, support and counseling about physical activity/exercise needs.;Develop an individualized exercise prescription for aerobic and resistive training based on initial evaluation findings, risk stratification, comorbidities and participant's personal goals. Provide advice, education, support and counseling about physical activity/exercise needs.;Develop an individualized exercise prescription for aerobic and resistive training based on initial evaluation findings, risk stratification, comorbidities and participant's personal goals. Provide advice, education, support and  counseling about physical activity/exercise needs.;Develop an individualized exercise prescription for aerobic and resistive training based on initial evaluation findings, risk stratification, comorbidities and participant's personal goals.     Expected Outcomes Short Term: Attend rehab on a regular basis to increase amount of physical activity.;Long Term: Exercising regularly at least 3-5 days a week.;Long Term: Add in home exercise to make exercise part of routine and to increase amount of physical activity. Short Term: Attend rehab on a regular basis to increase amount of physical activity.;Long Term: Exercising regularly at least 3-5 days a week.;Long Term: Add in home exercise to make exercise part of routine and to increase amount of physical activity. Short Term: Attend rehab on a regular basis to increase amount of physical activity.;Long Term: Exercising regularly at least 3-5 days a week.;Long Term: Add in home exercise to make exercise part of routine and to increase amount of physical activity.     Increase Strength and Stamina Yes Yes Yes     Intervention Provide advice, education, support and counseling about physical activity/exercise needs.;Develop an individualized exercise prescription for aerobic and resistive training based on initial evaluation findings, risk stratification, comorbidities and participant's personal goals. Provide advice, education, support and counseling about physical activity/exercise needs.;Develop an individualized exercise prescription for aerobic and resistive training based on initial evaluation findings, risk stratification, comorbidities and participant's personal goals. Provide advice, education, support and counseling about physical activity/exercise needs.;Develop an individualized exercise prescription for aerobic and resistive training based on initial evaluation findings, risk stratification, comorbidities and participant's personal goals.     Expected  Outcomes Short Term: Increase workloads from initial exercise prescription for resistance, speed, and METs.;Short Term: Perform resistance training exercises routinely during rehab and add in resistance training at home;Long Term: Improve cardiorespiratory fitness, muscular endurance and strength as measured by increased METs and functional capacity ( ) Short Term: Increase workloads from initial exercise prescription for resistance, speed, and METs.;Short Term: Perform resistance training exercises routinely during rehab and add in resistance training at home;Long Term: Improve cardiorespiratory fitness, muscular endurance and strength as measured by increased METs and functional capacity ( ) Short Term: Increase workloads from initial exercise prescription for resistance, speed, and METs.;Short Term: Perform resistance training exercises routinely during rehab and add in resistance training at home;Long Term: Improve cardiorespiratory fitness, muscular endurance and strength as measured by increased METs and functional capacity ( )     Able to understand  and use rate of perceived exertion (RPE) scale Yes Yes Yes     Intervention Provide education and explanation on how to use RPE scale Provide education and explanation on how to use RPE scale Provide education and explanation on how to use RPE scale     Expected Outcomes Short Term: Able to use RPE daily in rehab to express subjective intensity level;Long Term:  Able to use RPE to guide intensity level when exercising independently Short Term: Able to use RPE daily in rehab to express subjective intensity level;Long Term:  Able to use RPE to guide intensity level when exercising independently Short Term: Able to use RPE daily in rehab to express subjective intensity level;Long Term:  Able to use RPE to guide intensity level when exercising independently     Able to understand and use Dyspnea scale Yes Yes Yes     Intervention Provide education and  explanation on how to use Dyspnea scale Provide education and explanation on how to use Dyspnea scale Provide education and explanation on how to use Dyspnea scale     Expected Outcomes Short Term: Able to use Dyspnea scale daily in rehab to express subjective sense of shortness of breath during exertion;Long Term: Able to use Dyspnea scale to guide intensity level when exercising independently Short Term: Able to use Dyspnea scale daily in rehab to express subjective sense of shortness of breath during exertion;Long Term: Able to use Dyspnea scale to guide intensity level when exercising independently Short Term: Able to use Dyspnea scale daily in rehab to express subjective sense of shortness of breath during exertion;Long Term: Able to use Dyspnea scale to guide intensity level when exercising independently     Knowledge and understanding of Target Heart Rate Range (THRR) Yes Yes Yes     Intervention Provide education and explanation of THRR including how the numbers were predicted and where they are located for reference Provide education and explanation of THRR including how the numbers were predicted and where they are located for reference Provide education and explanation of THRR including how the numbers were predicted and where they are located for reference     Expected Outcomes Short Term: Able to state/look up THRR;Short Term: Able to use daily as guideline for intensity in rehab;Long Term: Able to use THRR to govern intensity when exercising independently Short Term: Able to state/look up THRR;Short Term: Able to use daily as guideline for intensity in rehab;Long Term: Able to use THRR to govern intensity when exercising independently Short Term: Able to state/look up THRR;Short Term: Able to use daily as guideline for intensity in rehab;Long Term: Able to use THRR to govern intensity when exercising independently     Understanding of Exercise Prescription Yes Yes Yes     Intervention Provide  education, explanation, and written materials on patient's individual exercise prescription Provide education, explanation, and written materials on patient's individual exercise prescription Provide education, explanation, and written materials on patient's individual exercise prescription     Expected Outcomes Short Term: Able to explain program exercise prescription;Long Term: Able to explain home exercise prescription to exercise independently Short Term: Able to explain program exercise prescription;Long Term: Able to explain home exercise prescription to exercise independently Short Term: Able to explain program exercise prescription;Long Term: Able to explain home exercise prescription to exercise independently              Exercise Goals Re-Evaluation :  Exercise Goals Re-Evaluation     Row Name 12/10/22 1134 01/12/23 1132  Exercise Goal Re-Evaluation   Exercise Goals Review Increase Physical Activity;Able to understand and use Dyspnea scale;Understanding of Exercise Prescription;Increase Strength and Stamina;Knowledge and understanding of Target Heart Rate Range (THRR);Able to understand and use rate of perceived exertion (RPE) scale Increase Physical Activity;Able to understand and use Dyspnea scale;Understanding of Exercise Prescription;Increase Strength and Stamina;Knowledge and understanding of Target Heart Rate Range (THRR);Able to understand and use rate of perceived exertion (RPE) scale      Comments Victoria [name] has conpleted 2 exercises sessions. She exercises for 30 min on the Nustep. Victoria [name] averages 1.5 METs at level 1 on the Nustep. She performs the warmup and cooldown mostly seated due to orthopedic limitaitons and deconditioning. Victoria [name] is very deconditioned. It is too soon to note any discernable progressions. Will continue to monitor and progress as able. Victoria [name] has conpleted 11 exercises sessions. She exercises for 30 min on the Nustep. Victoria [name] averages 1.8 METs at  level 3 on the Nustep. She performs the warmup and cooldown mostly seated due to orthopedic limitaitons and deconditioning. Victoria [name] has improved her walking ability since starting. She walks in and out from the parking lot using a rollator. She seems motivated to improve her conditioning as we will discuss walking for 15 min during class. Will continue to monitor and progress as able.      Expected Outcomes Through exercise at rehab and home, the patient will decrease shortness of breath with daily activities and feel confident in carrying out an exercise regimen at home. Through exercise at rehab and home, the patient will decrease shortness of breath with daily activities and feel confident in carrying out an exercise regimen at home.               Discharge Exercise Prescription (Final Exercise Prescription Changes):  Exercise Prescription Changes - 01/13/23 1218       Response to Exercise   Blood Pressure (Admit) 118/68    Blood Pressure (Exit) 142/58    Heart Rate (Admit) 76 bpm    Heart Rate (Exercise) 91 bpm    Heart Rate (Exit) 80 bpm    Oxygen Saturation (Admit) 99 %    Oxygen Saturation (Exercise) 95 %    Oxygen Saturation (Exit) 100 %    Rating of Perceived Exertion (Exercise) 13    Perceived Dyspnea (Exercise) 2    Duration Continue with 30 min of aerobic exercise without signs/symptoms of physical distress.    Intensity THRR unchanged      Progression   Progression Continue to progress workloads to maintain intensity without signs/symptoms of physical distress.      Resistance Training   Training Prescription Yes    Weight red bands    Reps 10-15    Time 10 Minutes      Oxygen   Oxygen Continuous    Liters 2      NuStep   Level 3    Minutes 15    METs 1.9      Track   Laps 6    Minutes 13    METs 1.92      Oxygen   Maintain Oxygen Saturation 88% or higher             Nutrition:  Target Goals: Understanding of nutrition guidelines, daily intake  of sodium 1500mg , cholesterol 200mg , calories 30% from fat and 7% or less from saturated fats, daily to have 5 or more servings of fruits and vegetables.  Biometrics:    Nutrition Therapy Plan and Nutrition Goals:  Nutrition Therapy & Goals - 12/30/22 1401       Nutrition Therapy   Diet Heart Healthy Diet    Drug/Food Interactions Statins/Certain Fruits      Personal Nutrition Goals   Nutrition Goal Patient to improve diet quality by using the plate method as a guide for meal planning to include lean protein/plant protein, fruits, vegetables, whole grains, nonfat dairy as part of a well-balanced diet    Personal Goal #2 Patient to identify strategies for weight loss of 0.5-2.0# per week.    Comments Goals in progress. Victoria [name] has met with an outside dietitian (08/23/2022) to aid with improving eating habits and weight loss efforts (210# at that appointment). She is down ~10# since April of 2024; she is down an additional 2.9# since starting with our program.  Victoria [name] will continue to benefit from participation in pulmonary rehab for nutrition, exercise, and lifestyle modification.      Intervention Plan   Intervention Prescribe, educate and counsel regarding individualized specific dietary modifications aiming towards targeted core components such as weight, hypertension, lipid management, diabetes, heart failure and other comorbidities.;Nutrition handout(s) given to patient.    Expected Outcomes Short Term Goal: Understand basic principles of dietary content, such as calories, fat, sodium, cholesterol and nutrients.;Long Term Goal: Adherence to prescribed nutrition plan.             Nutrition Assessments:  MEDIFICTS Score Key: ?70 Need to make dietary changes  40-70 Heart Healthy Diet ? 40 Therapeutic Level Cholesterol Diet   Picture Your Plate Scores: <96 Unhealthy dietary pattern with much room for improvement. 41-50 Dietary pattern unlikely to meet recommendations for good  health and room for improvement. 51-60 More healthful dietary pattern, with some room for improvement.  >60 Healthy dietary pattern, although there may be some specific behaviors that could be improved.    Nutrition Goals Re-Evaluation:  Nutrition Goals Re-Evaluation     Row Name 12/02/22 1147 12/30/22 1401           Goals   Current Weight 200 lb 2.8 oz (90.8 kg) 197 lb 5 oz (89.5 kg)      Comment A1c 7.2, lipids WNL no new labs; most recent labs A1c 7.2, lipids WNL      Expected Outcome Goals in progress. Victoria [name] has met with an outside dietitian (08/23/2022) to aid with improving eating habits and weight loss efforts (210# at that appointment). She is down ~10# since April of 2024. Victoria [name] will continue to benefit from participation in pulmonary rehab for nutrition, exercise, and lifestyle modification. Goals in progress. Victoria [name] has met with an outside dietitian (08/23/2022) to aid with improving eating habits and weight loss efforts (210# at that appointment). She is down ~10# since April of 2024; she is down an additional 2.9# since starting with our program. Victoria [name] will continue to benefit from participation in pulmonary rehab for nutrition, exercise, and lifestyle modification.               Nutrition Goals Discharge (Final Nutrition Goals Re-Evaluation):  Nutrition Goals Re-Evaluation - 12/30/22 1401       Goals   Current Weight 197 lb 5 oz (89.5 kg)    Comment no new labs; most recent labs A1c 7.2, lipids WNL    Expected Outcome Goals in progress. Victoria [name] has met with an outside dietitian (08/23/2022) to aid with improving eating habits and weight loss efforts (210# at that appointment). She is down ~10# since April of 2024; she is down an additional  2.9# since starting with our program. Victoria [name] will continue to benefit from participation in pulmonary rehab for nutrition, exercise, and lifestyle modification.             Psychosocial: Target Goals: Acknowledge presence or  absence of significant depression and/or stress, maximize coping skills, provide positive support system. Participant is able to verbalize types and ability to use techniques and skills needed for reducing stress and depression.  Initial Review & Psychosocial Screening:  Initial Psych Review & Screening - 11/24/22 1149       Initial Review   Current issues with Current Stress Concerns    Source of Stress Concerns Family    Comments One brother has dementia, other brother type 1 diabetes. Both have been recently ill.      Family Dynamics   Good Support System? Yes    Comments Pt has supportive son and daughter-in-law & many church members      Barriers   Psychosocial barriers to participate in program The patient should benefit from training in stress management and relaxation.      Screening Interventions   Interventions Encouraged to exercise    Expected Outcomes Long Term Goal: Stressors or current issues are controlled or eliminated.;Long Term goal: The participant improves quality of Life and PHQ9 Scores as seen by post scores and/or verbalization of changes             Quality of Life Scores:  Scores of 19 and below usually indicate a poorer quality of life in these areas.  A difference of  2-3 points is a clinically meaningful difference.  A difference of 2-3 points in the total score of the Quality of Life Index has been associated with significant improvement in overall quality of life, self-image, physical symptoms, and general health in studies assessing change in quality of life.  PHQ-9: Review Flowsheet  More data exists      11/24/2022 08/19/2022 06/02/2022 05/29/2021 02/20/2020  Depression screen PHQ 2/9  Decreased Interest 1 0 0 0 0  Down, Depressed, Hopeless 0 0 0 0 1  PHQ - 2 Score 1 0 0 0 1  Altered sleeping 0 - - - -  Tired, decreased energy 1 - - - -  Change in appetite 0 - - - -  Feeling bad or failure about yourself  0 - - - -  Trouble concentrating 0 - -  - -  Moving slowly or fidgety/restless 1 - - - -  Suicidal thoughts 0 - - - -  PHQ-9 Score 3 - - - -  Difficult doing work/chores Not difficult at all - - - -    Details           Interpretation of Total Score  Total Score Depression Severity:  1-4 = Minimal depression, 5-9 = Mild depression, 10-14 = Moderate depression, 15-19 = Moderately severe depression, 20-27 = Severe depression   Psychosocial Evaluation and Intervention:  Psychosocial Evaluation - 11/24/22 1149       Psychosocial Evaluation & Interventions   Interventions Encouraged to exercise with the program and follow exercise prescription;Stress management education    Comments To decrease stress related to family illnesses    Expected Outcomes For Victoria [name] to participate in PR without any psychosocial barriers or concerns    Continue Psychosocial Services  Follow up required by staff             Psychosocial Re-Evaluation:  Psychosocial Re-Evaluation     Row Name 12/17/22 1049 01/10/23  1432           Psychosocial Re-Evaluation   Current issues with Current Stress Concerns Current Stress Concerns      Comments Victoria [name] continues to worry about her brother's declining health. She understands that there is not much she can do, but still worries. She denies any new psychoscoial barriers or concerns. Victoria [name] is working on stress relief and not getting emotional and/or upset when things happen that she doesn't have control over. Victoria [name]'s insurance has been providing rides for her and she gets upset when a SUV isn't sent, calling them to get picked up and being on hold, not being able to ride in the front seat, etc. When Kyiah does get upset and stressed her BP and HR go up. We are currently working with Victoria [name] for stress relief through meditation, exercise, and change of mind set. Victoria [name] denies any needs at this time.      Expected Outcomes For Yuette to participate in PR free of any psychosocial barriers or concerns. For  Victoria [name] to participate in PR free of any psychosocial barriers or concerns and to use positive coping mechanisms to handle stress and frustration      Interventions Stress management education;Encouraged to attend Pulmonary Rehabilitation for the exercise Stress management education;Encouraged to attend Pulmonary Rehabilitation for the exercise      Continue Psychosocial Services  No Follow up required No Follow up required               Psychosocial Discharge (Final Psychosocial Re-Evaluation):  Psychosocial Re-Evaluation - 01/10/23 1432       Psychosocial Re-Evaluation   Current issues with Current Stress Concerns    Comments Victoria [name] is working on stress relief and not getting emotional and/or upset when things happen that she doesn't have control over. Titania's insurance has been providing rides for her and she gets upset when a SUV isn't sent, calling them to get picked up and being on hold, not being able to ride in the front seat, etc. When Evalena does get upset and stressed her BP and HR go up. We are currently working with Victoria [name] for stress relief through meditation, exercise, and change of mind set. Victoria [name] denies any needs at this time.    Expected Outcomes For Victoria [name] to participate in PR free of any psychosocial barriers or concerns and to use positive coping mechanisms to handle stress and frustration    Interventions Stress management education;Encouraged to attend Pulmonary Rehabilitation for the exercise    Continue Psychosocial Services  No Follow up required             Education: Education Goals: Education classes will be provided on a weekly basis, covering required topics. Participant will state understanding/return demonstration of topics presented.  Learning Barriers/Preferences:  Learning Barriers/Preferences - 11/24/22 1131       Learning Barriers/Preferences   Learning Barriers Sight;Exercise Concerns    Learning Preferences Group Instruction;Individual  Instruction;Written Material;Verbal Instruction             Education Topics: Introduction to Pulmonary Rehab Group instruction provided by PowerPoint, verbal discussion, and written material to support subject matter. Instructor reviews what Pulmonary Rehab is, the purpose of the program, and how patients are referred.     Know Your Numbers Group instruction that is supported by a PowerPoint presentation. Instructor discusses importance of knowing and understanding resting, exercise, and post-exercise oxygen saturation, heart rate, and blood pressure. Oxygen saturation, heart rate, blood pressure, rating of perceived exertion, and dyspnea  are reviewed along with a normal range for these values.    Exercise for the Pulmonary Patient Group instruction that is supported by a PowerPoint presentation. Instructor discusses benefits of exercise, core components of exercise, frequency, duration, and intensity of an exercise routine, importance of utilizing pulse oximetry during exercise, safety while exercising, and options of places to exercise outside of rehab.       MET Level  Group instruction provided by PowerPoint, verbal discussion, and written material to support subject matter. Instructor reviews what METs are and how to increase METs.  Flowsheet Row PULMONARY REHAB CHRONIC OBSTRUCTIVE PULMONARY DISEASE from 01/13/2023 in [facility]  Date 01/13/23  Educator EP  Instruction Review Code 1- Verbalizes Understanding       Pulmonary Medications Verbally interactive group education provided by instructor with focus on inhaled medications and proper administration.   Anatomy and Physiology of the Respiratory System Group instruction provided by PowerPoint, verbal discussion, and written material to support subject matter. Instructor reviews respiratory cycle and anatomical components of the respiratory system and their  functions. Instructor also reviews differences in obstructive and restrictive respiratory diseases with examples of each.    Oxygen Safety Group instruction provided by PowerPoint, verbal discussion, and written material to support subject matter. There is an overview of "What is Oxygen" and "Why do we need it".  Instructor also reviews how to create a safe environment for oxygen use, the importance of using oxygen as prescribed, and the risks of noncompliance. There is a brief discussion on traveling with oxygen and resources the patient may utilize. Flowsheet Row PULMONARY REHAB CHRONIC OBSTRUCTIVE PULMONARY DISEASE from 12/02/2022 in [facility]  Date 12/02/22  Educator RN  Instruction Review Code 1- Verbalizes Understanding       Oxygen Use Group instruction provided by PowerPoint, verbal discussion, and written material to discuss how supplemental oxygen is prescribed and different types of oxygen supply systems. Resources for more information are provided.    Breathing Techniques Group instruction that is supported by demonstration and informational handouts. Instructor discusses the benefits of pursed lip and diaphragmatic breathing and detailed demonstration on how to perform both.     Risk Factor Reduction Group instruction that is supported by a PowerPoint presentation. Instructor discusses the definition of a risk factor, different risk factors for pulmonary disease, and how the heart and lungs work together. Flowsheet Row PULMONARY REHAB CHRONIC OBSTRUCTIVE PULMONARY DISEASE from 01/06/2023 in [facility], [facility] Lung [facility]  Date 01/06/23  Educator EP  Instruction Review Code 1- Verbalizes Understanding       MD Day A group question and answer session with a medical doctor that allows participants to ask questions that relate to their pulmonary disease state.   Nutrition for  the Pulmonary Patient Group instruction provided by PowerPoint slides, verbal discussion, and written materials to support subject matter. The instructor gives an explanation and review of healthy diet recommendations, which includes a discussion on weight management, recommendations for fruit and vegetable consumption, as well as protein, fluid, caffeine, fiber, sodium, sugar, and alcohol. Tips for eating when patients are short of breath are discussed. Flowsheet Row PULMONARY REHAB CHRONIC OBSTRUCTIVE PULMONARY DISEASE from 04/22/2016 in [facility], V[facility], [facility]  Date 03/25/16  [facility] Eating During the Holiday]  Educator RD  Instruction Review Code (Retired) 2- meets  goals/outcomes        Other Education Group or individual verbal, written, or video instructions that support the educational goals of the pulmonary rehab program. Flowsheet Row PULMONARY REHAB CHRONIC OBSTRUCTIVE PULMONARY DISEASE from 12/30/2022 in [facility] for Heart, Vascular, & Lung Health  Date 12/30/22  Educator RN  Instruction Review Code 1- Verbalizes Understanding        Knowledge Questionnaire Score:  Knowledge Questionnaire Score - 11/24/22 1358       Knowledge Questionnaire Score   Pre Score 14/18             Core Components/Risk Factors/Patient Goals at Admission:  Personal Goals and Risk Factors at Admission - 11/24/22 1150       Core Components/Risk Factors/Patient Goals on Admission    Weight Management Yes    Intervention Weight Management: Develop a combined nutrition and exercise program designed to reach desired caloric intake, while maintaining appropriate intake of nutrient and fiber, sodium and fats, and appropriate energy expenditure required for the weight goal.;Weight Management: Provide education and appropriate resources to help participant work on and attain dietary goals.;Weight Management/Obesity: Establish  reasonable short term and long term weight goals.;Obesity: Provide education and appropriate resources to help participant work on and attain dietary goals.    Expected Outcomes Short Term: Continue to assess and modify interventions until short term weight is achieved;Long Term: Adherence to nutrition and physical activity/exercise program aimed toward attainment of established weight goal;Weight Loss: Understanding of general recommendations for a balanced deficit meal plan, which promotes 1-2 lb weight loss per week and includes a negative energy balance of [phone] kcal/d;Understanding recommendations for meals to include 15-35% energy as protein, 25-35% energy from fat, 35-60% energy from carbohydrates, less than 200mg  of dietary cholesterol, 20-35 gm of total fiber daily;Understanding of distribution of calorie intake throughout the day with the consumption of 4-5 meals/snacks    Improve shortness of breath with ADL's Yes    Intervention Provide education, individualized exercise plan and daily activity instruction to help decrease symptoms of SOB with activities of daily living.    Expected Outcomes Short Term: Improve cardiorespiratory fitness to achieve a reduction of symptoms when performing ADLs;Long Term: Be able to perform more ADLs without symptoms or delay the onset of symptoms    Increase knowledge of respiratory medications and ability to use respiratory devices properly  Yes    Intervention Provide education and demonstration as needed of appropriate use of medications, inhalers, and oxygen therapy.    Expected Outcomes Short Term: Achieves understanding of medications use. Understands that oxygen is a medication prescribed by physician. Demonstrates appropriate use of inhaler and oxygen therapy.;Long Term: Maintain appropriate use of medications, inhalers, and oxygen therapy.             Core Components/Risk Factors/Patient Goals Review:   Goals and Risk Factor Review     Row  Name 12/17/22 1053 01/10/23 1443           Core Components/Risk Factors/Patient Goals Review   Personal Goals Review Weight Management/Obesity;Improve shortness of breath with ADL's;Develop more efficient breathing techniques such as purse lipped breathing and diaphragmatic breathing and practicing self-pacing with activity.;Increase knowledge of respiratory medications and ability to use respiratory devices properly. Weight Management/Obesity;Improve shortness of breath with ADL's;Develop more efficient breathing techniques such as purse lipped breathing and diaphragmatic breathing and practicing self-pacing with activity.;Increase knowledge of respiratory medications and ability to use respiratory devices properly.      Review Goal  progressing for weight loss. Avani has been working with our dietician for weight loss goals. Goal progressing on improving her shortness of breath with ADLs. Goal progressing on developing more efficient breathing techniques such as purse lipped breathing and diaphragmatic breathing; and practicing self-pacing with activity. Goal progressing for increasing knowledge of respiratory medications and ability to use respiratory devices properly. Goal in progress to lose weight and make healthy choices. Akaiya is working with our dietician on healthy eating habits and is currently down ~3#. Goal in progress on improving her shortness of breath with ADLs. [place] is maintaining her oxygen saturation of 88% or higher on 2L with exertion. Goal met on developing more efficient breathing techniques such as purse lipped breathing and diaphragmatic breathing; and practicing self-pacing with activity. Brianda can initiate these interventions without staff prompting. She can pace herself while walking. Goal met for increasing her knowledge of respiratory medications and ability to use respiratory devices properly. She has correctly demonstrated and voiced when to use her medications with our  respiratory therapist. She will continue to benefit from PR for nutrition, education, exercise, and lifestyle modification.      Expected Outcomes See admission goals For Shawnya to lose weight and to improve her shortness of breath with ADLs               Core Components/Risk Factors/Patient Goals at Discharge (Final Review):   Goals and Risk Factor Review - 01/10/23 1443       Core Components/Risk Factors/Patient Goals Review   Personal Goals Review Weight Management/Obesity;Improve shortness of breath with ADL's;Develop more efficient breathing techniques such as purse lipped breathing and diaphragmatic breathing and practicing self-pacing with activity.;Increase knowledge of respiratory medications and ability to use respiratory devices properly.    Review Goal in progress to lose weight and make healthy choices. Kemauri is working with our dietician on healthy eating habits and is currently down ~3#. Goal in progress on improving her shortness of breath with ADLs. Kahmari is maintaining her oxygen saturation of 88% or higher on 2L with exertion. Goal met on developing more efficient breathing techniques such as purse lipped breathing and diaphragmatic breathing; and practicing self-pacing with activity. Nahomi can initiate these interventions without staff prompting. She can pace herself while walking. Goal met for increasing her knowledge of respiratory medications and ability to use respiratory devices properly. She has correctly demonstrated and voiced when to use her medications with our respiratory therapist. She will continue to benefit from PR for nutrition, education, exercise, and lifestyle modification.    Expected Outcomes For Kadiatou to lose weight and to improve her shortness of breath with ADLs             ITP Comments:Pt is making expected progress toward Pulmonary Rehab goals after completing 12 sessions. Recommend continued exercise, life style modification, education, and  utilization of breathing techniques to increase stamina and strength, while also decreasing shortness of breath with exertion.  Dr. Mechele Collin is Medical Director for Pulmonary Rehab at [facility].     Comments: Dr. Mechele Collin is Medical Director for Pulmonary Rehab at [facility].

## 2023-01-20 ENCOUNTER — Other Ambulatory Visit: Payer: Self-pay | Admitting: Internal Medicine

## 2023-01-20 ENCOUNTER — Encounter (HOSPITAL_COMMUNITY)
Admission: RE | Admit: 2023-01-20 | Discharge: 2023-01-20 | Disposition: A | Discharge status: Home / Self Care | Payer: 59 | Source: Ambulatory Visit | Attending: Internal Medicine | Admitting: Internal Medicine

## 2023-01-20 ENCOUNTER — Other Ambulatory Visit (HOSPITAL_COMMUNITY): Payer: Self-pay

## 2023-01-20 DIAGNOSIS — J449 Chronic obstructive pulmonary disease, unspecified: Secondary | ICD-10-CM | POA: Diagnosis present

## 2023-01-20 DIAGNOSIS — E519 Thiamine deficiency, unspecified: Secondary | ICD-10-CM

## 2023-01-20 NOTE — Progress Notes (Signed)
Daily Session Note  Patient Details  Name: Victoria [name] MRN: [id number] Date of Birth: 1945-12-06 Referring Provider:   Doristine Devoid Pulmonary Rehab Walk Test from 11/24/2022 in [facility] for [facility], V[facility], [facility]  Referring Provider Ramaswamy       Encounter Date: 01/20/2023  Check In:  Session Check In - 01/20/23 1214       Check-In   Supervising physician immediately available to respond to emergencies [facility] MD immediately available    Physician(s) Metta Clines, NP    Location [facility]    Staff Present Elissa Lovett BS, ACSM-CEP, Exercise Physiologist;Kaylee Earlene Plater, MS, ACSM-CEP, Exercise Physiologist;Tam Savoia Synthia Innocent, RN, BSN    Virtual Visit No    Medication changes reported     No    Fall or balance concerns reported    No    Tobacco Cessation No Change    Warm-up and Cool-down Performed as group-led instruction    Resistance Training Performed Yes    VAD Patient? No    PAD/SET Patient? No      Pain Assessment   Currently in Pain? No/denies    Multiple Pain Sites No             Capillary Blood Glucose: No results found. However, due to the size of the patient record, not all encounters were searched. Please check Results Review for a complete set of results.    Social History   Tobacco Use  Smoking Status Former   Current packs/day: 0.00   Average packs/day: 1 pack/day for 50.0 years (50.0 ttl pk-yrs)   Types: Cigarettes   Start date: 04/15/1961   Quit date: 04/16/2011   Years since quitting: 11.7  Smokeless Tobacco Never  Tobacco Comments   quit that date when she had to go to ER     Goals Met:  Proper associated with RPD/PD & O2 Sat Independence with exercise equipment Exercise tolerated well No report of concerns or symptoms today Strength training completed today  Goals Unmet:  Not Applicable  Comments: Service time is from 1016 to 1153.    Dr. Mechele Collin is  Medical Director for Pulmonary Rehab at [facility].

## 2023-01-25 ENCOUNTER — Encounter: Payer: Self-pay | Admitting: Internal Medicine

## 2023-01-25 ENCOUNTER — Encounter (HOSPITAL_COMMUNITY)
Admission: RE | Admit: 2023-01-25 | Discharge: 2023-01-25 | Disposition: A | Discharge status: Home / Self Care | Payer: 59 | Source: Ambulatory Visit | Attending: Internal Medicine | Admitting: Internal Medicine

## 2023-01-25 ENCOUNTER — Ambulatory Visit (INDEPENDENT_AMBULATORY_CARE_PROVIDER_SITE_OTHER): Payer: 59 | Admitting: Internal Medicine

## 2023-01-25 VITALS — BP 136/80 | HR 74 | Ht 64.5 in | Wt 192.0 lb

## 2023-01-25 DIAGNOSIS — E118 Type 2 diabetes mellitus with unspecified complications: Secondary | ICD-10-CM

## 2023-01-25 DIAGNOSIS — J449 Chronic obstructive pulmonary disease, unspecified: Secondary | ICD-10-CM | POA: Diagnosis not present

## 2023-01-25 DIAGNOSIS — E119 Type 2 diabetes mellitus without complications: Secondary | ICD-10-CM

## 2023-01-25 DIAGNOSIS — Z7984 Long term (current) use of oral hypoglycemic drugs: Secondary | ICD-10-CM

## 2023-01-25 LAB — POCT GLYCOSYLATED HEMOGLOBIN (HGB A1C): Hemoglobin A1C: 6.1 % — AB (ref 4.0–5.6)

## 2023-01-25 LAB — POCT GLUCOSE (DEVICE FOR HOME USE): Glucose Fasting, POC: 103 mg/dL — AB (ref 70–99)

## 2023-01-25 MED ORDER — JANUMET XR 50-1000 MG PO TB24
2.0000 | ORAL_TABLET | Freq: Every day | ORAL | 3 refills | Status: DC
Start: 2023-01-25 — End: 2023-09-20

## 2023-01-25 MED ORDER — DAPAGLIFLOZIN PROPANEDIOL 10 MG PO TABS
10.0000 mg | ORAL_TABLET | Freq: Every day | ORAL | 3 refills | Status: DC
Start: 2023-01-25 — End: 2023-09-20

## 2023-01-25 NOTE — Progress Notes (Signed)
Daily Session Note  Patient Details  Name: Victoria [name] MRN: [id number] Date of Birth: 1946-03-16 Referring Provider:   Doristine Devoid Pulmonary Rehab Walk Test from 11/24/2022 in [facility] for Heart, Vascular, & Lung Health  Referring Provider Ramaswamy       Encounter Date: 01/25/2023  Check In:  Session Check In - 01/25/23 0955       Check-In   Supervising physician immediately available to respond to emergencies [facility] MD immediately available    Physician(s) Joni Reining, NP    [facility]    Staff Present Elissa Lovett BS, ACSM-CEP, Exercise Physiologist;Kaylee Earlene Plater, MS, ACSM-CEP, Exercise Physiologist;Orris Perin Synthia Innocent, RN, BSN;Samantha Belarus, RD, LDN    Virtual Visit No    Medication changes reported     No    Fall or balance concerns reported    No    Tobacco Cessation No Change    Warm-up and Cool-down Performed as group-led instruction    Resistance Training Performed Yes    VAD Patient? No    PAD/SET Patient? No      Pain Assessment   Currently in Pain? No/denies    Multiple Pain Sites No             Capillary Blood Glucose: No results found. However, due to the size of the patient record, not all encounters were searched. Please check Results Review for a complete set of results.    Social History   Tobacco Use  Smoking Status Former   Current packs/day: 0.00   Average packs/day: 1 pack/day for 50.0 years (50.0 ttl pk-yrs)   Types: Cigarettes   Start date: 04/15/1961   Quit date: 04/16/2011   Years since quitting: 11.7  Smokeless Tobacco Never  Tobacco Comments   quit that date when she had to go to ER     Goals Met:  Proper associated with RPD/PD & O2 Sat Independence with exercise equipment Exercise tolerated well No report of concerns or symptoms today Strength training completed today  Goals Unmet:  Not Applicable  Comments: Service time is from 1014 to  1147.    Dr. Mechele Collin is Medical Director for Pulmonary Rehab at [facility].

## 2023-01-25 NOTE — Patient Instructions (Addendum)
Take Janumet 2 tablets before Supper  Take Farxiga 10 mg, 1 tablet daily    HOW TO TREAT LOW BLOOD SUGARS (Blood sugar LESS THAN 70 MG/DL) Please follow the RULE OF 15 for the treatment of hypoglycemia treatment (when your (blood sugars are less than 70 mg/dL)   STEP 1: Take 15 grams of carbohydrates when your blood sugar is low, which includes:  3-4 GLUCOSE TABS  OR 3-4 OZ OF JUICE OR REGULAR SODA OR ONE TUBE OF GLUCOSE GEL    STEP 2: RECHECK blood sugar in 15 MINUTES STEP 3: If your blood sugar is still low at the 15 minute recheck --> then, go back to STEP 1 and treat AGAIN with another 15 grams of carbohydrates.

## 2023-01-25 NOTE — Progress Notes (Signed)
Name: Victoria [name]  Age/ Sex: 77 y.o., female   MRN/ DOB: [id number], [birth year]     PCP: Etta Grandchild, MD   Reason for Endocrinology Evaluation: Type 2 Diabetes Mellitus  Initial Endocrine Consultative Visit: 02/28/2014    PATIENT IDENTIFIER: [name] is a 77 y.o. female with a past medical history of OSA, COPD, HTN, Hx of breast Ca. The patient has followed with Endocrinology clinic since 02/28/2014 for consultative assistance with management of her diabetes.  DIABETIC HISTORY:  [name] was diagnosed with DM 2017. Her hemoglobin A1c has ranged from 6.3% in 2022, peaking at 10.3% in 2020.   PCP prescribed Farxiga 05/2022 but pt developed dizziness to 10 mg dose.    THYROID HISTORY: The patient has been diagnosed with hyperthyroid in 2019.  Thyroid ultrasound showed multinodular goiter.   She is s/p benign FNA of the isthmic nodule in May 2006 and in 2018 She is S/P benign FNA of right mid thyroid nodule 02/2015 and in 2019   Thyroid ultrasound on 03/02/2021 showed 6 year stability of multiple thyroid nodules and NO further ultrasound was recommended  She is S/P RAI ablation 31.5 mCi I-131 sodium iodide orally  03/19/2019  TFT's have been normal    SUBJECTIVE:   During the last visit (07/19/2022): A1c 7.2%   Today (01/25/2023): [name]  is here for a follow up on diabetes management. She checks her blood sugars 1 times daily. The patient has not had hypoglycemic episodes since the last clinic visit  She is undergoing pulmonary PT for COPD  She continues to follow-up with oncology for history of estrogen receptor positive breast cancer  She is on oxygen 24 hrs a day  She denies nausea or vomiting except last week she had an episode of nausea  She was prescribed farxiga by PCP but she developed dizziness  Denies constipation nor diarrhea  She has history of aortic aneurysm  HOME DIABETES REGIMEN:  Janumet 50-1000 2 tabs daily  Farxiga 10 mg  daily    Statin: yes ACE-I/ARB: no   METER DOWNLOAD SUMMARY: Did not bring        DIABETIC COMPLICATIONS: Microvascular complications:   Denies: CKD, retinopathy  Last Eye Exam: Completed 2022  Macrovascular complications:   Denies: CAD, CVA, PVD   HISTORY:  Past Medical History:  Past Medical History:  Diagnosis Date   Abdominal aneurysm (HCC)    Dr. Darrick Penna follows lLOV 2 ''17 per pt "around 2 cm"   Anemia    as a child   Arthritis    left ankle, right knee, right SI joint, wrists, lower back   Breast cancer in female [facility])    Left   COPD (chronic obstructive pulmonary disease) (HCC)    ephysema-Dr. 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 Gerrit Friends in [place]   Sleep apnea    cpap used sometimes, uses oxygen concentrator 2 l/m nasally bedtime   Varicella as child  Past Surgical History:  Past Surgical History:  Procedure Laterality Date   APPENDECTOMY     2008   BREAST LUMPECTOMY Left 08/31/2017   BREAST LUMPECTOMY WITH RADIOACTIVE SEED LOCALIZATION Left 08/10/2017   Procedure: BREAST LUMPECTOMY WITH RADIOACTIVE SEED LOCALIZATION;  Surgeon: Emelia Loron, MD;  Location: [facility] OR;  Service: General;  Laterality: Left;   CESAREAN SECTION     1972   COLONOSCOPY     COLONOSCOPY WITH PROPOFOL N/A 04/16/2016   Procedure: COLONOSCOPY WITH PROPOFOL;  Surgeon: Jeani Hawking, MD;  Location: [facility];  Service: Endoscopy;  Laterality: N/A;   CYST REMOVAL NECK Left 04/13/2016   Procedure: EXCISION OF SEBACEOUS CYST LEFT POSTERIOR NECK;  Surgeon: Darnell Level, MD;   Location: [facility] OR;  Service: General;  Laterality: Left;   Excision of Pelvic Absess, Right Ovary     2008   RE-EXCISION OF BREAST CANCER,SUPERIOR MARGINS Left 08/31/2017   Procedure: RE-EXCISION OF LEFT  BREAST MARGINS ERAS PATHWAY;  Surgeon: Emelia Loron, MD;  Location: [facility] OR;  Service: General;  Laterality: Left;   TUBAL LIGATION  1980   Social History:  reports that she quit smoking about 11 years ago. Her smoking use included cigarettes. She started smoking about 61 years ago. She has a 50 pack-year smoking history. She has never used smokeless tobacco. She reports current alcohol use. She reports that she does not use drugs. Family History:  Family History  Problem Relation Age of Onset   Heart disease Mother        MI - fatal   Hypertension Mother    Stroke Father 22       fatal   Alzheimer's disease Father    Alzheimer's disease Brother    Hyperlipidemia Brother    Hypertension Brother    Diabetes Brother    Hypertension Brother    Hyperlipidemia Brother    Breast cancer Paternal Grandmother      HOME MEDICATIONS: Allergies as of 01/25/2023       Reactions   Benicar [olmesartan] Swelling   Swelling of face and arms    [name] [valsartan] Swelling   Swelling of face and arms    Hydrocodone-acetaminophen Nausea And Vomiting   Severe vomiting   Lisinopril Cough   Monosodium Glutamate Other (See Comments)   Facial swelling per pt   Codeine Other (See Comments)   jittery   Lead Acetate Rash   Nickel Rash   Severe rash to infection: pt is allergic to all metals other than sterling silver or gold jewelry.         Medication List        Accurate as of January 25, 2023  1:01 PM. If you have any questions, ask your nurse or doctor.          STOP taking these medications    thiamine 100 MG tablet Commonly known as: VITAMIN B1 Stopped by: Johnney Ou Mabry Santarelli       TAKE these medications    Accu-Chek Aviva Plus w/Device Kit 1 Device by Does not  apply route daily. Use to monitor glucose levels once per day; E11.9   Accu-Chek Softclix Lancets lancets Use as instructed to check blood sugar 3X daily   acetaminophen 500 MG tablet Commonly known as: TYLENOL Take 1,000 mg by mouth every 6 (six) hours as needed.   amLODipine 10 MG tablet Commonly known as: NORVASC TAKE ONE TABLET BY MOUTH ONCE DAILY   aspirin EC 81 MG tablet Take 1 tablet (81 mg  total) by mouth daily.   carvedilol 6.25 MG tablet Commonly known as: COREG TAKE ONE TABLET BY MOUTH EVERY MORNING and TAKE ONE TABLET BY MOUTH EVERYDAY AT BEDTIME   Farxiga 10 MG Tabs tablet Generic drug: dapagliflozin propanediol TAKE ONE TABLET BY MOUTH BEFORE BREAKFAST   glucose blood test strip USE 1 STRIP ONCE DAILY   Accu-Chek Guide test strip Generic drug: glucose blood USE 1 STRIP ONCE DAILY   Accu-Chek Aviva Plus test strip Generic drug: glucose blood USE TO Check blood glucose THREE TIMES DAILY   Janumet XR 50-1000 MG Tb24 Generic drug: SitaGLIPtin-MetFORMIN HCl Take 2 tablets by mouth daily before supper.   methocarbamol 500 MG tablet Commonly known as: ROBAXIN Take 1 tablet (500 mg total) by mouth every 8 (eight) hours as needed for muscle spasms.   OXYGEN Inhale 2-3 L into the lungs continuous. When exerting self   Potassium Chloride ER 20 MEQ Tbcr Take 1 tablet by mouth every morning.   potassium chloride SA 20 MEQ tablet Commonly known as: KLOR-CON M Take 1 tablet (20 mEq total) by mouth 2 (two) times daily.   ProAir RespiClick 108 (90 Base) MCG/ACT Aepb Generic drug: Albuterol Sulfate Inhale 1-2 puffs into the lungs every 6 (six) hours as needed (for wheezing/shortness of breath).   albuterol (2.5 MG/3ML) 0.083% nebulizer solution Commonly known as: PROVENTIL Take 3 mLs (2.5 mg total) by nebulization every 6 (six) hours as needed for wheezing or shortness of breath.   Prolia 60 MG/ML Sosy injection Generic drug: denosumab Inject 60 mg into the  skin every 6 (six) months.   roflumilast 500 MCG Tabs tablet Commonly known as: DALIRESP TAKE ONE TABLET BY MOUTH EVERY MORNING   rosuvastatin 40 MG tablet Commonly known as: CRESTOR TAKE ONE TABLET BY MOUTH EVERYDAY AT BEDTIME   spironolactone 25 MG tablet Commonly known as: ALDACTONE TAKE ONE TABLET BY MOUTH ONCE DAILY   thiamine 100 MG tablet Commonly known as: VITAMIN B1 TAKE 1 TABLET BY MOUTH EVERY OTHER DAY   Trelegy Ellipta 100-62.5-25 MCG/ACT Aepb Generic drug: Fluticasone-Umeclidin-Vilant INHALE ONE (1) PUFF BY MOUTH AND INTO THE LUNGS DAILY *REFILL REQUEST*   Vitamin D3 125 MCG (5000 UT) Caps TAKE ONE CAPSULE BY MOUTH ONCE DAILY         OBJECTIVE:   Vital Signs: BP 136/80 (BP Location: Left Arm, Patient Position: Sitting, Cuff Size: Large)   Pulse 74   Ht 5' 4.5" (1.638 m)   Wt 192 lb (87.1 kg)   SpO2 92%   BMI 32.45 kg/m   Wt Readings from Last 3 Encounters:  01/25/23 192 lb (87.1 kg)  01/18/23 197 lb 5 oz (89.5 kg)  01/04/23 196 lb 10.4 oz (89.2 kg)     Exam: General: Pt appears well and is in NAD  Lungs: Clear with good BS bilat   Heart: RRR   Extremities: No pretibial edema.   Neuro: MS is good with appropriate affect, pt is alert and Ox3    DM foot exam: 01/25/2023  The skin of the feet is without sores or ulcerations, multiple callus formation noted b/l The pedal pulses are 2+ on right and 2+ on left. The sensation is intact to a screening 5.07, 10 gram monofilament bilaterally        DATA REVIEWED:  Lab Results  Component Value Date   HGBA1C 6.1 (A) 01/25/2023   HGBA1C 7.2 (A) 07/19/2022   HGBA1C 7.1 (A) 03/25/2022    Latest Reference Range & Units 06/15/22  15:11  Sodium 135 - 145 mEq/L 138  Potassium 3.5 - 5.1 mEq/L 4.1  Chloride 96 - 112 mEq/L 99  CO2 19 - 32 mEq/L 34 (H)  Glucose 70 - 99 mg/dL 161 (H)  BUN 6 - 23 mg/dL 24 (H)  Creatinine 0.96 - 1.20 mg/dL 0.45  Calcium 8.4 - 40.9 mg/dL 9.9  GFR >81.19 mL/min 58.93 (L)     In office BG 103 mg/dL   ASSESSMENT / PLAN / RECOMMENDATIONS:   1) Type 2 Diabetes Mellitus, Optimally controlled, Without complications - Most recent A1c of 6.1 %. Goal A1c < 7.5 %.    -A1c remains at goal -She has been able to tolerate a full dose of Farxiga after gradually increasing the dose -No changes at this time, but if her A1c continues to be below 6.5% we may consider decreasing Janumet   MEDICATIONS: Continue Janumet 50-1000 mg, 2 tabs before supper Continue Farxiga 10 mg, 1 tab every morning  EDUCATION / INSTRUCTIONS: BG monitoring instructions: Patient is instructed to check her blood sugars 1 times a day, fasting. Call [facility] if: BG persistently < 70  I reviewed the Rule of 15 for the treatment of hypoglycemia in detail with the patient. Literature supplied.    2) Diabetic complications:  Eye: Does not have known diabetic retinopathy.  Neuro/ Feet: Does not have known diabetic peripheral neuropathy .  Renal: Patient does not have known baseline CKD. She   is not on an ACEI/ARB at present.     F/U in 6 months    I spent 30 minutes preparing to see the patient by review of recent labs, imaging and procedures, obtaining and reviewing separately obtained history, communicating with the patient, ordering medications, tests or procedures, and documenting clinical information in the EHR including the differential Dx, treatment, and any further evaluation and other management    Signed electronically by: Lyndle Herrlich, MD  [facility] Endocrinology  [facility] [address] [place], [place] [address] Phone: [phone] FAX: [phone]   CC: Etta Grandchild, MD [address] [place] [address] Phone: [phone]  Fax: [phone]  Return to Endocrinology clinic as below: Future Appointments  Date Time Provider Department Center  01/25/2023  1:40 PM Takasha Vetere, Konrad Dolores, MD  [facility] None  01/27/2023 10:15 AM [facility]ER[facility]AD [facility] None  02/01/2023 10:15 AM [facility] None  02/03/2023 10:15 AM [facility]GR[facility] None  02/08/2023 10:15 AM [facility] None  02/10/2023 10:15 AM [facility]GR[facility] None  02/15/2023 10:15 AM [facility]GRAD [facility] None  02/17/2023 10:15 AM [facility]ERGRAD [facility] None  06/16/2023  2:00 PM [facility] DX DEXA 1 [facility]BCGDG GI-BREAST CE  09/16/2023 11:30 AM Iruku, Burnice Logan, MD [facility] None

## 2023-01-26 ENCOUNTER — Telehealth (HOSPITAL_COMMUNITY): Payer: Self-pay

## 2023-01-26 NOTE — Telephone Encounter (Signed)
Victoria [name] called and is able to attend class on September 24 and 26. She will no longer be traveling to [place], I have put them appointments back in!

## 2023-01-27 ENCOUNTER — Encounter (HOSPITAL_COMMUNITY)
Admission: RE | Admit: 2023-01-27 | Discharge: 2023-01-27 | Disposition: A | Discharge status: Home / Self Care | Payer: 59 | Source: Ambulatory Visit | Attending: Internal Medicine | Admitting: Internal Medicine

## 2023-01-27 DIAGNOSIS — J449 Chronic obstructive pulmonary disease, unspecified: Secondary | ICD-10-CM | POA: Diagnosis not present

## 2023-01-27 NOTE — Progress Notes (Signed)
Daily Session Note  Patient Details  Name: Victoria [name] MRN: [id number] Date of Birth: 12/05/1945 Referring Provider:   Doristine Devoid Pulmonary Rehab Walk Test from 11/24/2022 in [facility] for Heart, Vascular, & Lung Health  Referring Provider Ramaswamy       Encounter Date: 01/27/2023  Check In:  Session Check In - 01/27/23 1139       Check-In   Supervising physician immediately available to respond to emergencies [facility] MD immediately available    Physician(s) Bernadene Person, NP    Location [facility]    Staff Present Elissa Lovett BS, ACSM-CEP, Exercise Physiologist;Kaylee Earlene Plater, MS, ACSM-CEP, Exercise Physiologist;Casey Synthia Innocent, RN, BSN;Samantha Belarus, RD, LDN    Virtual Visit No    Medication changes reported     No    Fall or balance concerns reported    No    Tobacco Cessation No Change    Warm-up and Cool-down Performed as group-led instruction    Resistance Training Performed Yes    VAD Patient? No    PAD/SET Patient? No      Pain Assessment   Currently in Pain? No/denies    Pain Score 0-No pain    Multiple Pain Sites No             Capillary Blood Glucose: No results found. However, due to the size of the patient record, not all encounters were searched. Please check Results Review for a complete set of results.    Social History   Tobacco Use  Smoking Status Former   Current packs/day: 0.00   Average packs/day: 1 pack/day for 50.0 years (50.0 ttl pk-yrs)   Types: Cigarettes   Start date: 04/15/1961   Quit date: 04/16/2011   Years since quitting: 11.7  Smokeless Tobacco Never  Tobacco Comments   quit that date when she had to go to ER     Goals Met:  Independence with exercise equipment Exercise tolerated well No report of concerns or symptoms today Strength training completed today  Goals Unmet:  Not Applicable  Comments: Service time is from 1019 to 1141    Dr. Mechele Collin  is Medical Director for Pulmonary Rehab at [facility].

## 2023-02-01 ENCOUNTER — Encounter (HOSPITAL_COMMUNITY)
Admission: RE | Admit: 2023-02-01 | Discharge: 2023-02-01 | Disposition: A | Discharge status: Home / Self Care | Payer: 59 | Source: Ambulatory Visit | Attending: Internal Medicine

## 2023-02-01 VITALS — Wt 195.1 lb

## 2023-02-01 DIAGNOSIS — J449 Chronic obstructive pulmonary disease, unspecified: Secondary | ICD-10-CM

## 2023-02-01 NOTE — Progress Notes (Signed)
Daily Session Note  Patient Details  Name: Victoria [name] MRN: [id number] Date of Birth: April 06, 1946 Referring Provider:   Doristine Devoid Pulmonary Rehab Walk Test from 11/24/2022 in [facility]  Referring Provider Ramaswamy       Encounter Date: 02/01/2023  Check In:  Session Check In - 02/01/23 1105       Check-In   Supervising physician immediately available to respond to emergencies [facility] MD immediately available    Physician(s) Jari Favre, NP    Location [facility]    Staff Present Elissa Lovett BS, ACSM-CEP, Exercise Physiologist;Kaylee Earlene Plater, MS, ACSM-CEP, Exercise Physiologist;Casey Synthia Innocent, RN, BSN;Samantha Belarus, RD, Aleatha Borer, MS, ACSM-CEP, CCRP, Exercise Physiologist    Virtual Visit No    Medication changes reported     No    Fall or balance concerns reported    No    Tobacco Cessation No Change    Warm-up and Cool-down Performed as group-led instruction    Resistance Training Performed Yes    VAD Patient? No    PAD/SET Patient? No      Pain Assessment   Currently in Pain? No/denies    Multiple Pain Sites No             Capillary Blood Glucose: No results found. However, due to the size of the patient record, not all encounters were searched. Please check Results Review for a complete set of results.   Exercise Prescription Changes - 02/01/23 1200       Response to Exercise   Blood Pressure (Admit) 142/56    Blood Pressure (Exercise) 160/56    Blood Pressure (Exit) 140/50    Heart Rate (Admit) 81 bpm    Heart Rate (Exercise) 94 bpm    Heart Rate (Exit) 88 bpm    Oxygen Saturation (Admit) 98 %    Oxygen Saturation (Exercise) 98 %    Oxygen Saturation (Exit) 100 %    Rating of Perceived Exertion (Exercise) 13    Perceived Dyspnea (Exercise) 3    Duration Continue with 30 min of aerobic exercise without signs/symptoms of physical distress.     Intensity THRR unchanged      Progression   Progression Continue to progress workloads to maintain intensity without signs/symptoms of physical distress.      Resistance Training   Training Prescription Yes    Weight red bands    Reps 10-15    Time 10 Minutes      Interval Training   Interval Training No      Oxygen   Oxygen Continuous    Liters 2      NuStep   Level 3    Minutes 15    METs 1.7      Track   Laps 2    Minutes 5    METs 1.92      Oxygen   Maintain Oxygen Saturation 88% or higher             Social History   Tobacco Use  Smoking Status Former   Current packs/day: 0.00   Average packs/day: 1 pack/day for 50.0 years (50.0 ttl pk-yrs)   Types: Cigarettes   Start date: 04/15/1961   Quit date: 04/16/2011   Years since quitting: 11.8  Smokeless Tobacco Never  Tobacco Comments   quit that date when she had to go to ER     Goals Met:  Independence with exercise  equipment Exercise tolerated well No report of concerns or symptoms today Strength training completed today  Goals Unmet:  Not Applicable  Comments: Service time is from 1010 to 1145.    Dr. Mechele Collin is Medical Director for Pulmonary Rehab at [facility].

## 2023-02-01 NOTE — Progress Notes (Signed)
Home Exercise Prescription I have reviewed a Home Exercise Prescription with Victoria [name]. Victoria [name] is not currently exercising at home. She has access to a local fitness center. Victoria [name] mentioned that her insurance company can pay for her membership to the fitness center. I encouraged Victoria [name] to exercise at the fitness center 2-3 days/wk. We discussed exercise equipment options. I recommended she used the Nustep for 30 min/day at the fitness center. Victoria [name] agreed with my recommendations. She seems very motivated to exercise and improve her functional capacity. The patient stated that their goals were to improve breathing and lose weight. We reviewed exercise guidelines, target heart rate during exercise, RPE Scale, weather conditions, endpoints for exercise, warmup and cool down. The patient is encouraged to come to me with any questions. I will continue to follow up with the patient to assist them with progression and safety. Spent 15 min with patient discussing home exercise plan and goals  Victoria San, MS, ACSM-CEP 02/01/2023 3:41 PM

## 2023-02-02 ENCOUNTER — Telehealth (HOSPITAL_COMMUNITY): Payer: Self-pay

## 2023-02-02 NOTE — Telephone Encounter (Signed)
Called [facility] about adding in appts. Left a message with a call back number.

## 2023-02-03 ENCOUNTER — Encounter (HOSPITAL_COMMUNITY)
Admission: RE | Admit: 2023-02-03 | Discharge: 2023-02-03 | Disposition: A | Discharge status: Home / Self Care | Payer: 59 | Source: Ambulatory Visit | Attending: Internal Medicine | Admitting: Internal Medicine

## 2023-02-03 DIAGNOSIS — J449 Chronic obstructive pulmonary disease, unspecified: Secondary | ICD-10-CM

## 2023-02-03 NOTE — Progress Notes (Signed)
Daily Session Note  Patient Details  Name: Victoria [name] MRN: [id number] Date of Birth: 09-18-1945 Referring Provider:   Doristine Devoid Pulmonary Rehab Walk Test from 11/24/2022 in [facility]  Referring Provider Ramaswamy       Encounter Date: 02/03/2023  Check In:  Session Check In - 02/03/23 1130       Check-In   Supervising physician immediately available to respond to emergencies [facility] MD immediately available    Physician(s) Jari Favre, NP    [facility]    Staff Present Elissa Lovett BS, ACSM-CEP, Exercise Physiologist;Kaylee Earlene Plater, MS, ACSM-CEP, Exercise Physiologist;Temika Sutphin Synthia Innocent, RN, BSN    Virtual Visit No    Medication changes reported     No    Fall or balance concerns reported    No    Tobacco Cessation No Change    Warm-up and Cool-down Performed as group-led instruction    Resistance Training Performed Yes    VAD Patient? No    PAD/SET Patient? No      Pain Assessment   Currently in Pain? No/denies    Multiple Pain Sites No             Capillary Blood Glucose: No results found. However, due to the size of the patient record, not all encounters were searched. Please check Results Review for a complete set of results.    Social History   Tobacco Use  Smoking Status Former   Current packs/day: 0.00   Average packs/day: 1 pack/day for 50.0 years (50.0 ttl pk-yrs)   Types: Cigarettes   Start date: 04/15/1961   Quit date: 04/16/2011   Years since quitting: 11.8  Smokeless Tobacco Never  Tobacco Comments   quit that date when she had to go to ER     Goals Met:  Proper associated with RPD/PD & O2 Sat Independence with exercise equipment Exercise tolerated well No report of concerns or symptoms today Strength training completed today  Goals Unmet:  Not Applicable  Comments: Service time is from 1020 to 1207.    Dr. Mechele Collin is  Medical Director for Pulmonary Rehab at [facility].

## 2023-02-04 ENCOUNTER — Other Ambulatory Visit: Payer: Self-pay

## 2023-02-05 ENCOUNTER — Encounter (HOSPITAL_COMMUNITY): Payer: Self-pay

## 2023-02-08 ENCOUNTER — Encounter (HOSPITAL_COMMUNITY)
Admission: RE | Admit: 2023-02-08 | Discharge: 2023-02-08 | Disposition: A | Discharge status: Home / Self Care | Payer: 59 | Source: Ambulatory Visit | Attending: Internal Medicine | Admitting: Internal Medicine

## 2023-02-08 DIAGNOSIS — J449 Chronic obstructive pulmonary disease, unspecified: Secondary | ICD-10-CM | POA: Diagnosis not present

## 2023-02-08 LAB — GLUCOSE, CAPILLARY: Glucose-Capillary: 102 mg/dL — ABNORMAL HIGH (ref 70–99)

## 2023-02-08 NOTE — Progress Notes (Signed)
Daily Session Note  Patient Details  Name: Victoria [name] MRN: [id number] Date of Birth: 04/08/1946 Referring Provider:   Doristine Devoid Pulmonary Rehab Walk Test from 11/24/2022 in [facility]  Referring Provider Ramaswamy       Encounter Date: 02/08/2023  Check In:  Session Check In - 02/08/23 1200       Check-In   Supervising physician immediately available to respond to emergencies [facility] immediately available    Physician(s) Carlos Levering, NP    [facility]    Staff Present Elissa Lovett BS, ACSM-CEP, Exercise Physiologist;Kaylee Earlene Plater, MS, ACSM-CEP, Exercise Physiologist;Kevon Tench Synthia Innocent, RN, BSN    Virtual Visit No    Medication changes reported     No    Fall or balance concerns reported    No    Tobacco Cessation No Change    Warm-up and Cool-down Performed as group-led instruction    Resistance Training Performed Yes    VAD Patient? No    PAD/SET Patient? No      Pain Assessment   Currently in Pain? No/denies    Pain Score 0-No pain    Multiple Pain Sites No             Capillary Blood Glucose: Results for orders placed or performed during the hospital encounter of 02/08/23 (from the past 24 hour(s))  Glucose, capillary     Status: Abnormal   Collection Time: 02/08/23 10:49 AM  Result Value Ref Range   Glucose-Capillary 102 (H) 70 - 99 mg/dL   *Note: Due to a large number of results and/or encounters for the requested time period, some results have not been displayed. A complete set of results can be found in Results Review.      Social History   Tobacco Use  Smoking Status Former   Current packs/day: 0.00   Average packs/day: 1 pack/day for 50.0 years (50.0 ttl pk-yrs)   Types: Cigarettes   Start date: 04/15/1961   Quit date: 04/16/2011   Years since quitting: 11.8  Smokeless Tobacco Never  Tobacco Comments   quit that date when she had to  go to ER     Goals Met:  Proper associated with RPD/PD & O2 Sat Independence with exercise equipment Exercise tolerated well No report of concerns or symptoms today Strength training completed today  Goals Unmet:  Not Applicable  Comments: Service time is from 1011 to 1142.    Dr. Mechele Collin is Medical Director for Pulmonary Rehab at [facility].

## 2023-02-09 ENCOUNTER — Other Ambulatory Visit: Payer: Self-pay | Admitting: Internal Medicine

## 2023-02-09 DIAGNOSIS — M839 Adult osteomalacia, unspecified: Secondary | ICD-10-CM

## 2023-02-10 ENCOUNTER — Encounter (HOSPITAL_COMMUNITY): Payer: 59

## 2023-02-15 ENCOUNTER — Telehealth: Payer: Self-pay | Admitting: Internal Medicine

## 2023-02-15 ENCOUNTER — Encounter (HOSPITAL_COMMUNITY)
Admission: RE | Admit: 2023-02-15 | Discharge: 2023-02-15 | Disposition: A | Discharge status: Home / Self Care | Payer: 59 | Source: Ambulatory Visit | Attending: Internal Medicine | Admitting: Internal Medicine

## 2023-02-15 VITALS — Wt 194.2 lb

## 2023-02-15 DIAGNOSIS — J449 Chronic obstructive pulmonary disease, unspecified: Secondary | ICD-10-CM | POA: Insufficient documentation

## 2023-02-15 NOTE — Progress Notes (Signed)
Daily Session Note  Patient Details  Name: Victoria [name] MRN: [id number] Date of Birth: [birth year] Referring Provider:   Doristine Devoid Pulmonary Rehab Walk Test from 11/24/2022 in [facility], [facility]  Referring Provider Ramaswamy       Encounter Date: 02/15/2023  Check In:  Session Check In - 02/15/23 1126       Check-In   Supervising physician immediately available to respond to emergencies [facility] immediately available    Physician(s) Robin Searing, NP    [facility]    Staff Present Elissa Lovett BS, ACSM-CEP, Exercise Physiologist;Kaylee Earlene Plater, MS, ACSM-CEP, Exercise Physiologist;Zymeir Salminen Synthia Innocent, RN, BSN    Virtual Visit No    Medication changes reported     No    Fall or balance concerns reported    No    Tobacco Cessation No Change    Warm-up and Cool-down Performed as group-led instruction    Resistance Training Performed Yes    VAD Patient? No    PAD/SET Patient? No      Pain Assessment   Currently in Pain? No/denies    Multiple Pain Sites No             Capillary Blood Glucose: No results found. However, due to the size of the patient record, not all encounters were searched. Please check Results Review for a complete set of results.   Exercise Prescription Changes - 02/15/23 1100       Response to Exercise   Blood Pressure (Admit) 134/50    Blood Pressure (Exercise) 156/60    Blood Pressure (Exit) 122/50    Heart Rate (Admit) 85 bpm    Heart Rate (Exercise) 95 bpm    Heart Rate (Exit) 88 bpm    Oxygen Saturation (Admit) 98 %    Oxygen Saturation (Exercise) 95 %    Oxygen Saturation (Exit) 99 %    Rating of Perceived Exertion (Exercise) 12    Perceived Dyspnea (Exercise) 3    Duration Continue with 30 min of aerobic exercise without signs/symptoms of physical distress.    Intensity THRR unchanged      Progression   Progression Continue to progress workloads  to maintain intensity without signs/symptoms of physical distress.      Resistance Training   Training Prescription Yes    Weight red bands    Reps 10-15    Time 10 Minutes      Oxygen   Oxygen Continuous    Liters 2      NuStep   Level 3    Minutes 15    METs 2      Track   Laps 6    Minutes 15    METs 1.92      Oxygen   Maintain Oxygen Saturation 88% or higher             Social History   Tobacco Use  Smoking Status Former   Current packs/day: 0.00   Average packs/day: 1 pack/day for 50.0 years (50.0 ttl pk-yrs)   Types: Cigarettes   Start date: 04/15/1961   Quit date: 04/16/2011   Years since quitting: 11.8  Smokeless Tobacco Never  Tobacco Comments   quit that date when she had to go to ER     Goals Met:  Proper associated with RPD/PD & O2 Sat Independence with exercise equipment Exercise tolerated well No report of concerns or symptoms today Strength training completed  today  Goals Unmet:  Not Applicable  Comments: Service time is from 1009 to 1145.    Dr. Mechele Collin is Medical Director for Pulmonary Rehab at [facility].

## 2023-02-15 NOTE — Telephone Encounter (Signed)
Patient is in Pulmonary rehab and her sessions have been extend until October 22nd. She needs for Dr. Marchelle Gearing to "okay" it so that she can continue to get rides to her appointment through [facility].

## 2023-02-16 NOTE — Progress Notes (Signed)
Pulmonary Individual Treatment Plan  Patient Details  Name: Victoria [name] MRN: [id number] Date of Birth: [birth year] Referring Provider:   Doristine Devoid Pulmonary Rehab Walk Test from 11/24/2022 in [facility]  Referring Provider Ramaswamy       Initial Encounter Date:  Flowsheet Row Pulmonary Rehab Walk Test from 11/24/2022 in [facility]  Date 11/24/22       Visit Diagnosis: Stage 3 severe COPD by GOLD classification (HCC)  [name] Home Medications on Admission:   Current Outpatient Medications:    Accu-Chek Softclix Lancets lancets, Use as instructed to check blood sugar 3X daily, Disp: 100 each, Rfl: 1   acetaminophen (TYLENOL) 500 MG tablet, Take 1,000 mg by mouth every 6 (six) hours as needed., Disp: , Rfl:    albuterol (PROVENTIL) (2.5 MG/3ML) 0.083% nebulizer solution, Take 3 mLs (2.5 mg total) by nebulization every 6 (six) hours as needed for wheezing or shortness of breath., Disp: 75 mL, Rfl: 3   Albuterol Sulfate (PROAIR RESPICLICK) 108 (90 Base) MCG/ACT AEPB, Inhale 1-2 puffs into the lungs every 6 (six) hours as needed (for wheezing/shortness of breath)., Disp: 1 each, Rfl: 5   amLODipine (NORVASC) 10 MG tablet, TAKE ONE TABLET BY MOUTH ONCE DAILY, Disp: 30 tablet, Rfl: 0   aspirin EC 81 MG tablet, Take 1 tablet (81 mg total) by mouth daily., Disp: 90 tablet, Rfl: 3   Blood Glucose Monitoring Suppl (ACCU-CHEK AVIVA PLUS) w/Device KIT, 1 Device by Does not apply route daily. Use to monitor glucose levels once per day; E11.9, Disp: 1 kit, Rfl: 0   carvedilol (COREG) 6.25 MG tablet, TAKE ONE TABLET BY MOUTH EVERY MORNING and TAKE ONE TABLET BY MOUTH EVERYDAY AT BEDTIME, Disp: 60 tablet, Rfl: 0   Cholecalciferol (VITAMIN D3) 125 MCG (5000 UT) CAPS, TAKE ONE CAPSULE BY MOUTH ONCE DAILY, Disp: 90 capsule, Rfl: 0   dapagliflozin propanediol (FARXIGA) 10 MG  TABS tablet, Take 1 tablet (10 mg total) by mouth daily., Disp: 90 tablet, Rfl: 3   denosumab (PROLIA) 60 MG/ML SOSY injection, Inject 60 mg into the skin every 6 (six) months., Disp: 1 mL, Rfl: 1   Fluticasone-Umeclidin-Vilant (TRELEGY ELLIPTA) 100-62.5-25 MCG/ACT AEPB, INHALE ONE (1) PUFF BY MOUTH AND INTO THE LUNGS DAILY *REFILL REQUEST*, Disp: 60 each, Rfl: 10   glucose blood (ACCU-CHEK AVIVA PLUS) test strip, USE TO Check blood glucose THREE TIMES DAILY, Disp: 300 strip, Rfl: 5   glucose blood (ACCU-CHEK GUIDE) test strip, USE 1 STRIP ONCE DAILY, Disp: 100 each, Rfl: 0   glucose blood test strip, USE 1 STRIP ONCE DAILY, Disp: 100 each, Rfl: 12   methocarbamol (ROBAXIN) 500 MG tablet, Take 1 tablet (500 mg total) by mouth every 8 (eight) hours as needed for muscle spasms., Disp: 30 tablet, Rfl: 0   OXYGEN, Inhale 2-3 L into the lungs continuous. When exerting self, Disp: , Rfl:    Potassium Chloride ER 20 MEQ TBCR, Take 1 tablet by mouth every morning., Disp: , Rfl:    potassium chloride SA (KLOR-CON) 20 MEQ tablet, Take 1 tablet (20 mEq total) by mouth 2 (two) times daily., Disp: 180 tablet, Rfl: 0   roflumilast (DALIRESP) 500 MCG TABS tablet, TAKE ONE TABLET BY MOUTH EVERY MORNING, Disp: 90 tablet, Rfl: 3   rosuvastatin (CRESTOR) 40 MG tablet, TAKE ONE TABLET BY MOUTH EVERYDAY AT BEDTIME, Disp: 30 tablet, Rfl: 0  SitaGLIPtin-MetFORMIN HCl (JANUMET XR) 50-1000 MG TB24, Take 2 tablets by mouth daily before supper., Disp: 180 tablet, Rfl: 3   spironolactone (ALDACTONE) 25 MG tablet, TAKE ONE TABLET BY MOUTH ONCE DAILY, Disp: 30 tablet, Rfl: 0   thiamine (VITAMIN B1) 100 MG tablet, TAKE 1 TABLET BY MOUTH EVERY OTHER DAY, Disp: 15 tablet, Rfl: 10  Past Medical History: Past Medical History:  Diagnosis Date   Abdominal aneurysm (HCC)    Dr. Darrick Penna follows lLOV 2 ''17 per pt "around 2 cm"   Anemia    as a child   Arthritis    left ankle, right knee, right SI joint, wrists, lower back    Breast cancer in female [facility])    Left   COPD (chronic obstructive pulmonary disease) (HCC)    ephysema-Dr. 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 Gerrit Friends in [place]   Sleep apnea    cpap used sometimes, uses oxygen concentrator 2 l/m nasally bedtime   Varicella as child    Tobacco Use: Social History   Tobacco Use  Smoking Status Former   Current packs/day: 0.00   Average packs/day: 1 pack/day for 50.0 years (50.0 ttl pk-yrs)   Types: Cigarettes   Start date: 04/15/1961   Quit date: 04/16/2011   Years since quitting: 11.8  Smokeless Tobacco Never  Tobacco Comments   quit that date when she had to go to ER     Labs: Review Flowsheet  More data exists      Latest Ref Rng & Units 07/22/2021 11/10/2021 03/25/2022 07/19/2022 01/25/2023  Labs for ITP Cardiac and Pulmonary Rehab  Cholestrol 100 - 199 mg/dL - 161  - - -  LDL (calc) 0 - 99 mg/dL - 40  - - -  HDL-C >09 mg/dL - 51  - - -  Trlycerides 0 - 149 mg/dL - 604  - - -  Hemoglobin A1c 4.0 - 5.6 % 7.2  - 7.1  7.2  6.1     Details            Capillary Blood Glucose: Lab Results  Component Value Date   GLUCAP 102 (H) 02/08/2023   GLUCAP 111 (H) 12/07/2022   GLUCAP 125 (H) 12/07/2022   GLUCAP 152 (H) 12/02/2022   GLUCAP 131 (H) 12/02/2022    POCT Glucose     Row Name 11/24/22 1141             POCT Blood Glucose   Pre-Exercise 108 mg/dL                Pulmonary Assessment Scores:  Pulmonary Assessment Scores     Row Name 11/24/22 1120         ADL UCSD   SOB Score total 68       CAT Score   CAT  Score 24       mMRC Score   mMRC Score 3  UCSD: Self-administered rating of dyspnea associated with activities of daily living (ADLs) 6-point scale (0 = "not at all" to 5 = "maximal or unable to do because of breathlessness")  Scoring Scores range from 0 to 120.  Minimally important difference is 5 units  CAT: CAT can identify the health impairment of COPD patients and is better correlated with disease progression.  CAT has a scoring range of zero to 40. The CAT score is classified into four groups of low (less than 10), medium (10 - 20), high (21-30) and very high (31-40) based on the impact level of disease on health status. A CAT score over 10 suggests significant symptoms.  A worsening CAT score could be explained by an exacerbation, poor medication adherence, poor inhaler technique, or progression of COPD or comorbid conditions.  CAT MCID is 2 points  mMRC: mMRC (Modified Medical Research Council) Dyspnea Scale is used to assess the degree of baseline functional disability in patients of respiratory disease due to dyspnea. No minimal important difference is established. A decrease in score of 1 point or greater is considered a positive change.   Pulmonary Function Assessment:  Pulmonary Function Assessment - 11/24/22 1120       Breath   Bilateral Breath Sounds Decreased    Shortness of Breath Yes;Fear of Shortness of Breath;Limiting activity             Exercise Target Goals: Exercise Program Goal: Individual exercise prescription set using results from initial 6 min walk test and THRR while considering  patient's activity barriers and safety.   Exercise Prescription Goal: Initial exercise prescription builds to 30-45 minutes a day of aerobic activity, 2-3 days per week.  Home exercise guidelines will be given to patient during program as part of exercise prescription that the participant will acknowledge.  Activity Barriers & Risk Stratification:   Activity Barriers & Cardiac Risk Stratification - 11/24/22 1059       Activity Barriers & Cardiac Risk Stratification   Activity Barriers Deconditioning;Muscular Weakness;Shortness of Breath;Joint Problems;Balance Concerns             6 Minute Walk:  6 Minute Walk     Row Name 11/24/22 1258         6 Minute Walk   Phase Initial     Distance 427 feet     Walk Time 6 minutes     # of Rest Breaks 2  stopped at 1:10, re-started at 2:30, stopped at 4:12, re-started at 5:20     MPH 0.81     METS 1.05     RPE 15     Perceived Dyspnea  3     VO2 Peak 3.67     Symptoms Yes (comment)     Comments dyspnea     Resting HR 82 bpm     Resting BP 158/78     Resting Oxygen Saturation  92 %     Exercise Oxygen Saturation  during 6 min walk 85 %     Max Ex. HR 105 bpm     Max Ex. BP 192/66     2 Minute Post BP 151/72       Interval HR   1 Minute HR 98     2 Minute HR 93     3 Minute HR 92     4 Minute HR 105     5 Minute HR 95     6 Minute HR 101     2 Minute Post  HR 86     Interval Heart Rate? Yes       Interval Oxygen   Interval Oxygen? Yes     Baseline Oxygen Saturation % 92 %     1 Minute Oxygen Saturation % 91 %     1 Minute Liters of Oxygen 0 L     2 Minute Oxygen Saturation % 86 %  O2 dropped to 86% at 1:28     2 Minute Liters of Oxygen 1 L     3 Minute Oxygen Saturation % 85 %     3 Minute Liters of Oxygen 2 L     4 Minute Oxygen Saturation % 94 %     4 Minute Liters of Oxygen 2 L     5 Minute Oxygen Saturation % 97 %     5 Minute Liters of Oxygen 2 L     6 Minute Oxygen Saturation % 97 %     6 Minute Liters of Oxygen 2 L     2 Minute Post Oxygen Saturation % 99 %     2 Minute Post Liters of Oxygen 2 L              Oxygen Initial Assessment:  Oxygen Initial Assessment - 11/24/22 1105       Home Oxygen   Home Oxygen Device Portable Concentrator;Home Concentrator   back up tank if power outage   Sleep Oxygen Prescription Continuous    Liters per  minute 2    Home Exercise Oxygen Prescription None    Home Resting Oxygen Prescription Continuous    Liters per minute 2    Compliance with Home Oxygen Use Yes      Initial 6 min Walk   Oxygen Used Continuous    Liters per minute 2      Program Oxygen Prescription   Program Oxygen Prescription Continuous    Liters per minute 2      Intervention   Short Term Goals To learn and exhibit compliance with exercise, home and travel O2 prescription;To learn and understand importance of maintaining oxygen saturations>88%;To learn and demonstrate proper use of respiratory medications;To learn and understand importance of monitoring SPO2 with pulse oximeter and demonstrate accurate use of the pulse oximeter.;To learn and demonstrate proper pursed lip breathing techniques or other breathing techniques.     Long  Term Goals Verbalizes importance of monitoring SPO2 with pulse oximeter and return demonstration;Exhibits proper breathing techniques, such as pursed lip breathing or other method taught during program session;Demonstrates proper use of MDI's;Compliance with respiratory medication;Maintenance of O2 saturations>88%;Exhibits compliance with exercise, home  and travel O2 prescription             Oxygen Re-Evaluation:  Oxygen Re-Evaluation     Row Name 12/10/22 1136 01/12/23 1134 02/09/23 0720         Program Oxygen Prescription   Program Oxygen Prescription Continuous Continuous Continuous     Liters per minute 2 2 2        Home Oxygen   Home Oxygen Device Portable Concentrator;Home Concentrator Portable Concentrator;Home Concentrator Portable Concentrator;Home Concentrator     Sleep Oxygen Prescription Continuous Continuous Continuous     Liters per minute 2 2 2      Home Exercise Oxygen Prescription None None None     Home Resting Oxygen Prescription Continuous Continuous Continuous     Liters per minute 2 2 2      Compliance with Home Oxygen Use Yes Yes Yes  Goals/Expected  Outcomes   Short Term Goals To learn and exhibit compliance with exercise, home and travel O2 prescription;To learn and understand importance of maintaining oxygen saturations>88%;To learn and demonstrate proper use of respiratory medications;To learn and understand importance of monitoring SPO2 with pulse oximeter and demonstrate accurate use of the pulse oximeter.;To learn and demonstrate proper pursed lip breathing techniques or other breathing techniques.  To learn and exhibit compliance with exercise, home and travel O2 prescription;To learn and understand importance of maintaining oxygen saturations>88%;To learn and demonstrate proper use of respiratory medications;To learn and understand importance of monitoring SPO2 with pulse oximeter and demonstrate accurate use of the pulse oximeter.;To learn and demonstrate proper pursed lip breathing techniques or other breathing techniques.  To learn and exhibit compliance with exercise, home and travel O2 prescription;To learn and understand importance of maintaining oxygen saturations>88%;To learn and demonstrate proper use of respiratory medications;To learn and understand importance of monitoring SPO2 with pulse oximeter and demonstrate accurate use of the pulse oximeter.;To learn and demonstrate proper pursed lip breathing techniques or other breathing techniques.      Long  Term Goals Verbalizes importance of monitoring SPO2 with pulse oximeter and return demonstration;Exhibits proper breathing techniques, such as pursed lip breathing or other method taught during program session;Demonstrates proper use of MDI's;Compliance with respiratory medication;Maintenance of O2 saturations>88%;Exhibits compliance with exercise, home  and travel O2 prescription Verbalizes importance of monitoring SPO2 with pulse oximeter and return demonstration;Exhibits proper breathing techniques, such as pursed lip breathing or other method taught during program session;Demonstrates  proper use of MDI's;Compliance with respiratory medication;Maintenance of O2 saturations>88%;Exhibits compliance with exercise, home  and travel O2 prescription Verbalizes importance of monitoring SPO2 with pulse oximeter and return demonstration;Exhibits proper breathing techniques, such as pursed lip breathing or other method taught during program session;Demonstrates proper use of MDI's;Compliance with respiratory medication;Maintenance of O2 saturations>88%;Exhibits compliance with exercise, home  and travel O2 prescription     Goals/Expected Outcomes Compliance and understanding of oxygen saturation monitoring and breathing techniques to decrease shortness of breath. Compliance and understanding of oxygen saturation monitoring and breathing techniques to decrease shortness of breath. Compliance and understanding of oxygen saturation monitoring and breathing techniques to decrease shortness of breath.              Oxygen Discharge (Final Oxygen Re-Evaluation):  Oxygen Re-Evaluation - 02/09/23 0720       Program Oxygen Prescription   Program Oxygen Prescription Continuous    Liters per minute 2      Home Oxygen   Home Oxygen Device Portable Concentrator;Home Concentrator    Sleep Oxygen Prescription Continuous    Liters per minute 2    Home Exercise Oxygen Prescription None    Home Resting Oxygen Prescription Continuous    Liters per minute 2    Compliance with Home Oxygen Use Yes      Goals/Expected Outcomes   Short Term Goals To learn and exhibit compliance with exercise, home and travel O2 prescription;To learn and understand importance of maintaining oxygen saturations>88%;To learn and demonstrate proper use of respiratory medications;To learn and understand importance of monitoring SPO2 with pulse oximeter and demonstrate accurate use of the pulse oximeter.;To learn and demonstrate proper pursed lip breathing techniques or other breathing techniques.     Long  Term Goals  Verbalizes importance of monitoring SPO2 with pulse oximeter and return demonstration;Exhibits proper breathing techniques, such as pursed lip breathing or other method taught during program session;Demonstrates proper use of MDI's;Compliance with respiratory medication;Maintenance of  O2 saturations>88%;Exhibits compliance with exercise, home  and travel O2 prescription    Goals/Expected Outcomes Compliance and understanding of oxygen saturation monitoring and breathing techniques to decrease shortness of breath.             Initial Exercise Prescription:  Initial Exercise Prescription - 11/24/22 1200       Date of Initial Exercise RX and Referring Provider   Date 11/24/22    Referring Provider Ramaswamy    Expected Discharge Date 02/24/23      Oxygen   Oxygen Continuous    Liters 3    Maintain Oxygen Saturation 88% or higher      NuStep   Level 1    SPM 50    Minutes 30    METs 2      Prescription Details   Frequency (times per week) 2    Duration Progress to 30 minutes of continuous aerobic without signs/symptoms of physical distress      Intensity   THRR 40-80% of Max Heartrate 57-114    Ratings of Perceived Exertion 11-13    Perceived Dyspnea 0-4      Progression   Progression Continue progressive overload as per policy without signs/symptoms or physical distress.      Resistance Training   Training Prescription Yes    Weight red bands    Reps 10-15             Perform Capillary Blood Glucose checks as needed.  Exercise Prescription Changes:   Exercise Prescription Changes     Row Name 12/07/22 1200 12/21/22 1200 01/04/23 1200 01/13/23 1218 02/01/23 1200     Response to Exercise   Blood Pressure (Admit) 144/68 130/54 142/64 118/68 142/56   Blood Pressure (Exercise) 130/68 140/50 160/66 -- 160/56   Blood Pressure (Exit) 158/68 130/50 164/68 142/58 140/50   Heart Rate (Admit) 84 bpm 83 bpm 79 bpm 76 bpm 81 bpm   Heart Rate (Exercise) 85 bpm 87 bpm 82  bpm 91 bpm 94 bpm   Heart Rate (Exit) 86 bpm 81 bpm 78 bpm 80 bpm 88 bpm   Oxygen Saturation (Admit) 98 % 97 % 97 % 99 % 98 %   Oxygen Saturation (Exercise) 97 % 96 % 98 % 95 % 98 %   Oxygen Saturation (Exit) 97 % 98 % 100 % 100 % 100 %   Rating of Perceived Exertion (Exercise) 11 11 11 13 13    Perceived Dyspnea (Exercise) 1 1 1 2 3    Duration Progress to 30 minutes of  aerobic without signs/symptoms of physical distress Progress to 30 minutes of  aerobic without signs/symptoms of physical distress Continue with 30 min of aerobic exercise without signs/symptoms of physical distress. Continue with 30 min of aerobic exercise without signs/symptoms of physical distress. Continue with 30 min of aerobic exercise without signs/symptoms of physical distress.   Intensity THRR unchanged THRR unchanged THRR unchanged THRR unchanged THRR unchanged     Progression   Progression Continue to progress workloads to maintain intensity without signs/symptoms of physical distress. Continue to progress workloads to maintain intensity without signs/symptoms of physical distress. Continue to progress workloads to maintain intensity without signs/symptoms of physical distress. Continue to progress workloads to maintain intensity without signs/symptoms of physical distress. Continue to progress workloads to maintain intensity without signs/symptoms of physical distress.     Resistance Training   Training Prescription Yes Yes Yes Yes Yes   Weight red bands red bands red bands red bands red bands  Reps 10-15 10-15 10-15 10-15 10-15   Time 10 Minutes 10 Minutes 10 Minutes 10 Minutes 10 Minutes     Interval Training   Interval Training -- -- -- -- No     Oxygen   Oxygen Continuous Continuous Continuous Continuous Continuous   Liters 3 2 2 2 2      NuStep   Level 1 2 2 3 3    Minutes 30 30 30 15 15    METs 1.5 1.5 1.5 1.9 1.7     Track   Laps -- -- -- 6 2   Minutes -- -- -- 13 5   METs -- -- -- 1.92 1.92      Home Exercise Plan   Plans to continue exercise at -- -- -- -- [employer] (comment)   Frequency -- -- -- -- Add 3 additional days to program exercise sessions.   Initial Home Exercises Provided -- -- -- -- 02/01/23     Oxygen   Maintain Oxygen Saturation 88% or higher 88% or higher 88% or higher 88% or higher 88% or higher    Row Name 02/15/23 1100             Response to Exercise   Blood Pressure (Admit) 134/50       Blood Pressure (Exercise) 156/60       Blood Pressure (Exit) 122/50       Heart Rate (Admit) 85 bpm       Heart Rate (Exercise) 95 bpm       Heart Rate (Exit) 88 bpm       Oxygen Saturation (Admit) 98 %       Oxygen Saturation (Exercise) 95 %       Oxygen Saturation (Exit) 99 %       Rating of Perceived Exertion (Exercise) 12       Perceived Dyspnea (Exercise) 3       Duration Continue with 30 min of aerobic exercise without signs/symptoms of physical distress.       Intensity THRR unchanged         Progression   Progression Continue to progress workloads to maintain intensity without signs/symptoms of physical distress.         Resistance Training   Training Prescription Yes       Weight red bands       Reps 10-15       Time 10 Minutes         Oxygen   Oxygen Continuous       Liters 2         NuStep   Level 3       Minutes 15       METs 2         Track   Laps 6       Minutes 15       METs 1.92         Oxygen   Maintain Oxygen Saturation 88% or higher                Exercise Comments:   Exercise Comments     Row Name 12/02/22 1133 02/01/23 1532         Exercise Comments Pt completed first day of exercise. Victoria [name] exercised for 15 min on the Nustep. She tried to transition the arm ergometer due to her chronic pain. Victoria [name] did not tolerate the arm ergometer well as she was transitioned back to the Nustep. Victoria [name] is very deconditioned as she  did not exercise inconsistently. Discussed METs and how to increase METs. Completed home  ExRx. Victoria [name] is not currently exercising at home. She has access to a local fitness center. Markeda mentioned that her insurance company can pay for her membership to the fitness center. I encouraged Victoria [name] to exercise at the fitness center 2-3 days/wk. We discussed exercise equipment options. I recommended she used the Nustep for 30 min/day at the fitness center. Victoria [name] agreed with my recommendations. She seems very motivated to exercise and improve her functional capacity.               Exercise Goals and Review:   Exercise Goals     Row Name 11/24/22 1103 12/10/22 1133 01/12/23 1132         Exercise Goals   Increase Physical Activity Yes Yes Yes     Intervention Provide advice, education, support and counseling about physical activity/exercise needs.;Develop an individualized exercise prescription for aerobic and resistive training based on initial evaluation findings, risk stratification, comorbidities and participant's personal goals. Provide advice, education, support and counseling about physical activity/exercise needs.;Develop an individualized exercise prescription for aerobic and resistive training based on initial evaluation findings, risk stratification, comorbidities and participant's personal goals. Provide advice, education, support and counseling about physical activity/exercise needs.;Develop an individualized exercise prescription for aerobic and resistive training based on initial evaluation findings, risk stratification, comorbidities and participant's personal goals.     Expected Outcomes Short Term: Attend rehab on a regular basis to increase amount of physical activity.;Long Term: Exercising regularly at least 3-5 days a week.;Long Term: Add in home exercise to make exercise part of routine and to increase amount of physical activity. Short Term: Attend rehab on a regular basis to increase amount of physical activity.;Long Term: Exercising regularly at least 3-5 days a  week.;Long Term: Add in home exercise to make exercise part of routine and to increase amount of physical activity. Short Term: Attend rehab on a regular basis to increase amount of physical activity.;Long Term: Exercising regularly at least 3-5 days a week.;Long Term: Add in home exercise to make exercise part of routine and to increase amount of physical activity.     Increase Strength and Stamina Yes Yes Yes     Intervention Provide advice, education, support and counseling about physical activity/exercise needs.;Develop an individualized exercise prescription for aerobic and resistive training based on initial evaluation findings, risk stratification, comorbidities and participant's personal goals. Provide advice, education, support and counseling about physical activity/exercise needs.;Develop an individualized exercise prescription for aerobic and resistive training based on initial evaluation findings, risk stratification, comorbidities and participant's personal goals. Provide advice, education, support and counseling about physical activity/exercise needs.;Develop an individualized exercise prescription for aerobic and resistive training based on initial evaluation findings, risk stratification, comorbidities and participant's personal goals.     Expected Outcomes Short Term: Increase workloads from initial exercise prescription for resistance, speed, and METs.;Short Term: Perform resistance training exercises routinely during rehab and add in resistance training at home;Long Term: Improve cardiorespiratory fitness, muscular endurance and strength as measured by increased METs and functional capacity ( ) Short Term: Increase workloads from initial exercise prescription for resistance, speed, and METs.;Short Term: Perform resistance training exercises routinely during rehab and add in resistance training at home;Long Term: Improve cardiorespiratory fitness, muscular endurance and strength as measured  by increased METs and functional capacity ( ) Short Term: Increase workloads from initial exercise prescription for resistance, speed, and METs.;Short Term: Perform resistance training exercises routinely during rehab and add in  resistance training at home;Long Term: Improve cardiorespiratory fitness, muscular endurance and strength as measured by increased METs and functional capacity ( )     Able to understand and use rate of perceived exertion (RPE) scale Yes Yes Yes     Intervention Provide education and explanation on how to use RPE scale Provide education and explanation on how to use RPE scale Provide education and explanation on how to use RPE scale     Expected Outcomes Short Term: Able to use RPE daily in rehab to express subjective intensity level;Long Term:  Able to use RPE to guide intensity level when exercising independently Short Term: Able to use RPE daily in rehab to express subjective intensity level;Long Term:  Able to use RPE to guide intensity level when exercising independently Short Term: Able to use RPE daily in rehab to express subjective intensity level;Long Term:  Able to use RPE to guide intensity level when exercising independently     Able to understand and use Dyspnea scale Yes Yes Yes     Intervention Provide education and explanation on how to use Dyspnea scale Provide education and explanation on how to use Dyspnea scale Provide education and explanation on how to use Dyspnea scale     Expected Outcomes Short Term: Able to use Dyspnea scale daily in rehab to express subjective sense of shortness of breath during exertion;Long Term: Able to use Dyspnea scale to guide intensity level when exercising independently Short Term: Able to use Dyspnea scale daily in rehab to express subjective sense of shortness of breath during exertion;Long Term: Able to use Dyspnea scale to guide intensity level when exercising independently Short Term: Able to use Dyspnea scale daily in  rehab to express subjective sense of shortness of breath during exertion;Long Term: Able to use Dyspnea scale to guide intensity level when exercising independently     Knowledge and understanding of Target Heart Rate Range (THRR) Yes Yes Yes     Intervention Provide education and explanation of THRR including how the numbers were predicted and where they are located for reference Provide education and explanation of THRR including how the numbers were predicted and where they are located for reference Provide education and explanation of THRR including how the numbers were predicted and where they are located for reference     Expected Outcomes Short Term: Able to state/look up THRR;Short Term: Able to use daily as guideline for intensity in rehab;Long Term: Able to use THRR to govern intensity when exercising independently Short Term: Able to state/look up THRR;Short Term: Able to use daily as guideline for intensity in rehab;Long Term: Able to use THRR to govern intensity when exercising independently Short Term: Able to state/look up THRR;Short Term: Able to use daily as guideline for intensity in rehab;Long Term: Able to use THRR to govern intensity when exercising independently     Understanding of Exercise Prescription Yes Yes Yes     Intervention Provide education, explanation, and written materials on patient's individual exercise prescription Provide education, explanation, and written materials on patient's individual exercise prescription Provide education, explanation, and written materials on patient's individual exercise prescription     Expected Outcomes Short Term: Able to explain program exercise prescription;Long Term: Able to explain home exercise prescription to exercise independently Short Term: Able to explain program exercise prescription;Long Term: Able to explain home exercise prescription to exercise independently Short Term: Able to explain program exercise prescription;Long Term:  Able to explain home exercise prescription to exercise independently  Exercise Goals Re-Evaluation :  Exercise Goals Re-Evaluation     Row Name 12/10/22 1134 01/12/23 1132 02/09/23 0713         Exercise Goal Re-Evaluation   Exercise Goals Review Increase Physical Activity;Able to understand and use Dyspnea scale;Understanding of Exercise Prescription;Increase Strength and Stamina;Knowledge and understanding of Target Heart Rate Range (THRR);Able to understand and use rate of perceived exertion (RPE) scale Increase Physical Activity;Able to understand and use Dyspnea scale;Understanding of Exercise Prescription;Increase Strength and Stamina;Knowledge and understanding of Target Heart Rate Range (THRR);Able to understand and use rate of perceived exertion (RPE) scale Increase Physical Activity;Able to understand and use Dyspnea scale;Understanding of Exercise Prescription;Increase Strength and Stamina;Knowledge and understanding of Target Heart Rate Range (THRR);Able to understand and use rate of perceived exertion (RPE) scale     Comments Victoria [name] has conpleted 2 exercises sessions. She exercises for 30 min on the Nustep. Victoria [name] averages 1.5 METs at level 1 on the Nustep. She performs the warmup and cooldown mostly seated due to orthopedic limitaitons and deconditioning. Kittie is very deconditioned. It is too soon to note any discernable progressions. Will continue to monitor and progress as able. Victoria [name] has conpleted 11 exercises sessions. She exercises for 30 min on the Nustep. Victoria [name] averages 1.8 METs at level 3 on the Nustep. She performs the warmup and cooldown mostly seated due to orthopedic limitaitons and deconditioning. Victoria [name] has improved her walking ability since starting. She walks in and out from the parking lot using a rollator. She seems motivated to improve her conditioning as we will discuss walking for 15 min during class. Will continue to monitor and progress as able.  Victoria [name] has conpleted 18 exercises sessions. She exercises for 15 min on the Nustep and track. Victoria [name] averages 1.8 MEts at level 3 on the Nustep and 1.31 METs on the track. She performs the warmup and cooldown standing/ seated dependent on her shortness o breath. Victoria [name] has progressed to walking the track for 15 min. She tolerates track walking well and understands when to take rest breaks. Her level and METs have remained relatively the same on the Nustep. Will continue to monitor and progress as able.     Expected Outcomes Through exercise at rehab and home, the patient will decrease shortness of breath with daily activities and feel confident in carrying out an exercise regimen at home. Through exercise at rehab and home, the patient will decrease shortness of breath with daily activities and feel confident in carrying out an exercise regimen at home. Through exercise at rehab and home, the patient will decrease shortness of breath with daily activities and feel confident in carrying out an exercise regimen at home.              Discharge Exercise Prescription (Final Exercise Prescription Changes):  Exercise Prescription Changes - 02/15/23 1100       Response to Exercise   Blood Pressure (Admit) 134/50    Blood Pressure (Exercise) 156/60    Blood Pressure (Exit) 122/50    Heart Rate (Admit) 85 bpm    Heart Rate (Exercise) 95 bpm    Heart Rate (Exit) 88 bpm    Oxygen Saturation (Admit) 98 %    Oxygen Saturation (Exercise) 95 %    Oxygen Saturation (Exit) 99 %    Rating of Perceived Exertion (Exercise) 12    Perceived Dyspnea (Exercise) 3    Duration Continue with 30 min of aerobic exercise without signs/symptoms of physical distress.    Intensity THRR unchanged  Progression   Progression Continue to progress workloads to maintain intensity without signs/symptoms of physical distress.      Resistance Training   Training Prescription Yes    Weight red bands    Reps 10-15    Time  10 Minutes      Oxygen   Oxygen Continuous    Liters 2      NuStep   Level 3    Minutes 15    METs 2      Track   Laps 6    Minutes 15    METs 1.92      Oxygen   Maintain Oxygen Saturation 88% or higher             Nutrition:  Target Goals: Understanding of nutrition guidelines, daily intake of sodium 1500mg , cholesterol 200mg , calories 30% from fat and 7% or less from saturated fats, daily to have 5 or more servings of fruits and vegetables.  Biometrics:    Nutrition Therapy Plan and Nutrition Goals:  Nutrition Therapy & Goals - 02/01/23 1141       Nutrition Therapy   Diet Heart Healthy Diet    Drug/Food Interactions Statins/Certain Fruits      Personal Nutrition Goals   Nutrition Goal Patient to improve diet quality by using the plate method as a guide for meal planning to include lean protein/plant protein, fruits, vegetables, whole grains, nonfat dairy as part of a well-balanced diet   in progress.   Personal Goal #2 Patient to identify strategies for weight loss of 0.5-2.0# per week.   in progress.   Comments Goals in progress. Dawan has met with an outside dietitian (08/23/2022) to aid with improving eating habits and weight loss efforts (210# at that appointment). She is down ~10# since April of 2024; she is down an additional 5# since starting with our program.  Her A1c has improved to 6.1 in a pre-diabetic range. Lyndee will continue to benefit from participation in pulmonary rehab for nutrition, exercise, and lifestyle modification.      Intervention Plan   Intervention Prescribe, educate and counsel regarding individualized specific dietary modifications aiming towards targeted core components such as weight, hypertension, lipid management, diabetes, heart failure and other comorbidities.;Nutrition handout(s) given to patient.    Expected Outcomes Short Term Goal: Understand basic principles of dietary content, such as calories, fat, sodium, cholesterol and  nutrients.;Long Term Goal: Adherence to prescribed nutrition plan.             Nutrition Assessments:  MEDIFICTS Score Key: >=70 Need to make dietary changes  40-70 Heart Healthy Diet <= 40 Therapeutic Level Cholesterol Diet   Picture Your Plate Scores: <53 Unhealthy dietary pattern with much room for improvement. 41-50 Dietary pattern unlikely to meet recommendations for good health and room for improvement. 51-60 More healthful dietary pattern, with some room for improvement.  >60 Healthy dietary pattern, although there may be some specific behaviors that could be improved.    Nutrition Goals Re-Evaluation:  Nutrition Goals Re-Evaluation     Row Name 12/02/22 1147 12/30/22 1401 02/01/23 1141         Goals   Current Weight 200 lb 2.8 oz (90.8 kg) 197 lb 5 oz (89.5 kg) 195 lb 1.7 oz (88.5 kg)     Comment A1c 7.2, lipids WNL no new labs; most recent labs A1c 7.2, lipids WNL A1c 6.1; other most recent labs include lipids WNL     Expected Outcome Goals in progress. Ardean has met  with an outside dietitian (08/23/2022) to aid with improving eating habits and weight loss efforts (210# at that appointment). She is down ~10# since April of 2024. Aylissa will continue to benefit from participation in pulmonary rehab for nutrition, exercise, and lifestyle modification. Goals in progress. Khamiya has met with an outside dietitian (08/23/2022) to aid with improving eating habits and weight loss efforts (210# at that appointment). She is down ~10# since April of 2024; she is down an additional 2.9# since starting with our program. Arzu will continue to benefit from participation in pulmonary rehab for nutrition, exercise, and lifestyle modification. Goals in progress. Keoka has met with an outside dietitian (08/23/2022) to aid with improving eating habits and weight loss efforts (210# at that appointment). She is down ~10# since April of 2024; she is down an additional 5# since starting with our  program. Her A1c has improved to 6.1 in a pre-diabetic range. Mihaela will continue to benefit from participation in pulmonary rehab for nutrition, exercise, and lifestyle modification.              Nutrition Goals Discharge (Final Nutrition Goals Re-Evaluation):  Nutrition Goals Re-Evaluation - 02/01/23 1141       Goals   Current Weight 195 lb 1.7 oz (88.5 kg)    Comment A1c 6.1; other most recent labs include lipids WNL    Expected Outcome Goals in progress. Roniyah has met with an outside dietitian (08/23/2022) to aid with improving eating habits and weight loss efforts (210# at that appointment). She is down ~10# since April of 2024; she is down an additional 5# since starting with our program. Her A1c has improved to 6.1 in a pre-diabetic range. Shikira will continue to benefit from participation in pulmonary rehab for nutrition, exercise, and lifestyle modification.             Psychosocial: Target Goals: Acknowledge presence or absence of significant depression and/or stress, maximize coping skills, provide positive support system. Participant is able to verbalize types and ability to use techniques and skills needed for reducing stress and depression.  Initial Review & Psychosocial Screening:  Initial Psych Review & Screening - 11/24/22 1149       Initial Review   Current issues with Current Stress Concerns    Source of Stress Concerns Family    Comments One brother has dementia, other brother type 1 diabetes. Both have been recently ill.      Family Dynamics   Good Support System? Yes    Comments Pt has supportive son and daughter-in-law & many church members      Barriers   Psychosocial barriers to participate in program The patient should benefit from training in stress management and relaxation.      Screening Interventions   Interventions Encouraged to exercise    Expected Outcomes Long Term Goal: Stressors or current issues are controlled or eliminated.;Long Term  goal: The participant improves quality of Life and PHQ9 Scores as seen by post scores and/or verbalization of changes             Quality of Life Scores:  Scores of 19 and below usually indicate a poorer quality of life in these areas.  A difference of  2-3 points is a clinically meaningful difference.  A difference of 2-3 points in the total score of the Quality of Life Index has been associated with significant improvement in overall quality of life, self-image, physical symptoms, and general health in studies assessing change in quality of life.  PHQ-9: Review Flowsheet  More data exists      11/24/2022 08/19/2022 06/02/2022 05/29/2021 02/20/2020  Depression screen PHQ 2/9  Decreased Interest 1 0 0 0 0  Down, Depressed, Hopeless 0 0 0 0 1  PHQ - 2 Score 1 0 0 0 1  Altered sleeping 0 - - - -  Tired, decreased energy 1 - - - -  Change in appetite 0 - - - -  Feeling bad or failure about yourself  0 - - - -  Trouble concentrating 0 - - - -  Moving slowly or fidgety/restless 1 - - - -  Suicidal thoughts 0 - - - -  PHQ-9 Score 3 - - - -  Difficult doing work/chores Not difficult at all - - - -    Details           Interpretation of Total Score  Total Score Depression Severity:  1-4 = Minimal depression, 5-9 = Mild depression, 10-14 = Moderate depression, 15-19 = Moderately severe depression, 20-27 = Severe depression   Psychosocial Evaluation and Intervention:  Psychosocial Evaluation - 11/24/22 1149       Psychosocial Evaluation & Interventions   Interventions Encouraged to exercise with the program and follow exercise prescription;Stress management education    Comments To decrease stress related to family illnesses    Expected Outcomes For Kiante to participate in PR without any psychosocial barriers or concerns    Continue Psychosocial Services  Follow up required by staff             Psychosocial Re-Evaluation:  Psychosocial Re-Evaluation     Row Name 12/17/22  1049 01/10/23 1432 02/07/23 1424         Psychosocial Re-Evaluation   Current issues with Current Stress Concerns Current Stress Concerns Current Stress Concerns     Comments Mccayla continues to worry about her brother's declining health. She understands that there is not much she can do, but still worries. She denies any new psychoscoial barriers or concerns. Victoria [name] is working on stress relief and not getting emotional and/or upset when things happen that she doesn't have control over. Victoria [name]'s insurance has been providing rides for her and she gets upset when a SUV isn't sent, calling them to get picked up and being on hold, not being able to ride in the front seat, etc. When Victoria [name] does get upset and stressed her BP and HR go up. We are currently working with Victoria [name] for stress relief through meditation, exercise, and change of mind set. Victoria [name] denies any needs at this time. Victoria [name]'s stress has decreased significantly. Her transportation through insurance has finally worked out, no more late rides or rides leaving her stranded. Victoria [name]'s brothers' health have both been improving. Her BP has been WNL and she doesn't get angry or frustrated about the ride situation as much. She is practicing PLB and likes to exercise for stress relief.     Expected Outcomes For Victoria [name] to participate in PR free of any psychosocial barriers or concerns. For Victoria [name] to participate in PR free of any psychosocial barriers or concerns and to use positive coping mechanisms to handle stress and frustration For Victoria [name] to participate in PR free of any psychosocial barriers or concerns and to use positive coping mechanisms to handle stress     Interventions Stress management education;Encouraged to attend Pulmonary Rehabilitation for the exercise Stress management education;Encouraged to attend Pulmonary Rehabilitation for the exercise Stress management education;Encouraged to attend Pulmonary Rehabilitation for the exercise  Continue Psychosocial Services  No Follow up required No Follow up required No Follow up required              Psychosocial Discharge (Final Psychosocial Re-Evaluation):  Psychosocial Re-Evaluation - 02/07/23 1424       Psychosocial Re-Evaluation   Current issues with Current Stress Concerns    Comments Victoria [name]'s stress has decreased significantly. Her transportation through insurance has finally worked out, no more late rides or rides leaving her stranded. Victoria [name]'s brothers' health have both been improving. Her BP has been WNL and she doesn't get angry or frustrated about the ride situation as much. She is practicing PLB and likes to exercise for stress relief.    Expected Outcomes For Victoria [name] to participate in PR free of any psychosocial barriers or concerns and to use positive coping mechanisms to handle stress    Interventions Stress management education;Encouraged to attend Pulmonary Rehabilitation for the exercise    Continue Psychosocial Services  No Follow up required             Education: Education Goals: Education classes will be provided on a weekly basis, covering required topics. Participant will state understanding/return demonstration of topics presented.  Learning Barriers/Preferences:  Learning Barriers/Preferences - 11/24/22 1131       Learning Barriers/Preferences   Learning Barriers Sight;Exercise Concerns    Learning Preferences Group Instruction;Individual Instruction;Written Material;Verbal Instruction             Education Topics: Know Your Numbers Group instruction that is supported by a PowerPoint presentation. Instructor discusses importance of knowing and understanding resting, exercise, and post-exercise oxygen saturation, heart rate, and blood pressure. Oxygen saturation, heart rate, blood pressure, rating of perceived exertion, and dyspnea are reviewed along with a normal range for these values.    Exercise for the Pulmonary Patient Group  instruction that is supported by a PowerPoint presentation. Instructor discusses benefits of exercise, core components of exercise, frequency, duration, and intensity of an exercise routine, importance of utilizing pulse oximetry during exercise, safety while exercising, and options of places to exercise outside of rehab.    MET Level  Group instruction provided by PowerPoint, verbal discussion, and written material to support subject matter. Instructor reviews what METs are and how to increase METs.  Flowsheet Row PULMONARY REHAB CHRONIC OBSTRUCTIVE PULMONARY DISEASE from 01/13/2023 in [facility]  Date 01/13/23  Educator EP  Instruction Review Code 1- Verbalizes Understanding       Pulmonary Medications Verbally interactive group education provided by instructor with focus on inhaled medications and proper administration. Flowsheet Row PULMONARY REHAB CHRONIC OBSTRUCTIVE PULMONARY DISEASE from 02/03/2023 in [facility]  Date 02/03/23  Educator RT  Instruction Review Code 1- Verbalizes Understanding       Anatomy and Physiology of the Respiratory System Group instruction provided by PowerPoint, verbal discussion, and written material to support subject matter. Instructor reviews respiratory cycle and anatomical components of the respiratory system and their functions. Instructor also reviews differences in obstructive and restrictive respiratory diseases with examples of each.  Flowsheet Row PULMONARY REHAB CHRONIC OBSTRUCTIVE PULMONARY DISEASE from 01/27/2023 in [facility]  Date 01/27/23  Educator RT  Instruction Review Code 1- Verbalizes Understanding       Oxygen Safety Group instruction provided by PowerPoint, verbal discussion, and written material to support subject matter. There is an overview of "  What  is Oxygen" and "Why do we need it".  Instructor also reviews how to create a safe environment for oxygen use, the importance of using oxygen as prescribed, and the risks of noncompliance. There is a brief discussion on traveling with oxygen and resources the patient may utilize. Flowsheet Row PULMONARY REHAB CHRONIC OBSTRUCTIVE PULMONARY DISEASE from 12/02/2022 in [facility]  Date 12/02/22  Educator RN  Instruction Review Code 1- Verbalizes Understanding       Oxygen Use Group instruction provided by PowerPoint, verbal discussion, and written material to discuss how supplemental oxygen is prescribed and different types of oxygen supply systems. Resources for more information are provided.    Breathing Techniques Group instruction that is supported by demonstration and informational handouts. Instructor discusses the benefits of pursed lip and diaphragmatic breathing and detailed demonstration on how to perform both.     Risk Factor Reduction Group instruction that is supported by a PowerPoint presentation. Instructor discusses the definition of a risk factor, different risk factors for pulmonary disease, and how the heart and lungs work together. Flowsheet Row PULMONARY REHAB CHRONIC OBSTRUCTIVE PULMONARY DISEASE from 01/06/2023 in [facility]  Date 01/06/23  Educator EP  Instruction Review Code 1- Verbalizes Understanding       Pulmonary Diseases Group instruction provided by PowerPoint, verbal discussion, and written material to support subject matter. Instructor gives an overview of the different type of pulmonary diseases. There is also a discussion on risk factors and symptoms as well as ways to manage the diseases.   Stress and Energy Conservation Group instruction provided by PowerPoint, verbal discussion, and written material to support subject matter.  Instructor gives an overview of stress and the impact it can have on the body. Instructor also reviews ways to reduce stress. There is also a discussion on energy conservation and ways to conserve energy throughout the day.   Warning Signs and Symptoms Group instruction provided by PowerPoint, verbal discussion, and written material to support subject matter. Instructor reviews warning signs and symptoms of stroke, heart attack, cold and flu. Instructor also reviews ways to prevent the spread of infection.   Other Education Group or individual verbal, written, or video instructions that support the educational goals of the pulmonary rehab program. Flowsheet Row PULMONARY REHAB CHRONIC OBSTRUCTIVE PULMONARY DISEASE from 12/30/2022 in [facility]  Date 12/30/22  Educator RN  Instruction Review Code 1- Verbalizes Understanding        Knowledge Questionnaire Score:  Knowledge Questionnaire Score - 11/24/22 1358       Knowledge Questionnaire Score   Pre Score 14/18             Core Components/Risk Factors/Patient Goals at Admission:  Personal Goals and Risk Factors at Admission - 11/24/22 1150       Core Components/Risk Factors/Patient Goals on Admission    Weight Management Yes    Intervention Weight Management: Develop a combined nutrition and exercise program designed to reach desired caloric intake, while maintaining appropriate intake of nutrient and fiber, sodium and fats, and appropriate energy expenditure required for the weight goal.;Weight Management: Provide education and appropriate resources to help participant work on and attain dietary goals.;Weight Management/Obesity: Establish reasonable short term and long term weight goals.;Obesity: Provide education and appropriate resources to help participant work on and attain dietary goals.    Expected Outcomes  Short Term: Continue to assess and modify interventions  until short term weight is achieved;Long Term: Adherence to nutrition and physical activity/exercise program aimed toward attainment of established weight goal;Weight Loss: Understanding of general recommendations for a balanced deficit meal plan, which promotes 1-2 lb weight loss per week and includes a negative energy balance of [phone] kcal/d;Understanding recommendations for meals to include 15-35% energy as protein, 25-35% energy from fat, 35-60% energy from carbohydrates, less than 200mg  of dietary cholesterol, 20-35 gm of total fiber daily;Understanding of distribution of calorie intake throughout the day with the consumption of 4-5 meals/snacks    Improve shortness of breath with ADL's Yes    Intervention Provide education, individualized exercise plan and daily activity instruction to help decrease symptoms of SOB with activities of daily living.    Expected Outcomes Short Term: Improve cardiorespiratory fitness to achieve a reduction of symptoms when performing ADLs;Long Term: Be able to perform more ADLs without symptoms or delay the onset of symptoms    Increase knowledge of respiratory medications and ability to use respiratory devices properly  Yes    Intervention Provide education and demonstration as needed of appropriate use of medications, inhalers, and oxygen therapy.    Expected Outcomes Short Term: Achieves understanding of medications use. Understands that oxygen is a medication prescribed by physician. Demonstrates appropriate use of inhaler and oxygen therapy.;Long Term: Maintain appropriate use of medications, inhalers, and oxygen therapy.             Core Components/Risk Factors/Patient Goals Review:   Goals and Risk Factor Review     Row Name 12/17/22 1053 01/10/23 1443 02/07/23 1433         Core Components/Risk Factors/Patient Goals Review   Personal Goals Review Weight Management/Obesity;Improve shortness of breath with ADL's;Develop more efficient breathing  techniques such as purse lipped breathing and diaphragmatic breathing and practicing self-pacing with activity.;Increase knowledge of respiratory medications and ability to use respiratory devices properly. Weight Management/Obesity;Improve shortness of breath with ADL's;Develop more efficient breathing techniques such as purse lipped breathing and diaphragmatic breathing and practicing self-pacing with activity.;Increase knowledge of respiratory medications and ability to use respiratory devices properly. Weight Management/Obesity;Improve shortness of breath with ADL's     Review Goal progressing for weight loss. Jamieson has been working with our dietician for weight loss goals. Goal progressing on improving her shortness of breath with ADLs. Goal progressing on developing more efficient breathing techniques such as purse lipped breathing and diaphragmatic breathing; and practicing self-pacing with activity. Goal progressing for increasing knowledge of respiratory medications and ability to use respiratory devices properly. Goal in progress to lose weight and make healthy choices. Teren is working with our dietician on healthy eating habits and is currently down ~3#. Goal in progress on improving her shortness of breath with ADLs. Victoria [name] is maintaining her oxygen saturation of 88% or higher on 2L with exertion. Goal met on developing more efficient breathing techniques such as purse lipped breathing and diaphragmatic breathing; and practicing self-pacing with activity. Shyna can initiate these interventions without staff prompting. She can pace herself while walking. Goal met for increasing her knowledge of respiratory medications and ability to use respiratory devices properly. She has correctly demonstrated and voiced when to use her medications with our respiratory therapist. She will continue to benefit from PR for nutrition, education, exercise, and lifestyle modification. Goal in progress to lose weight and  make healthy choices. Dallanara is working with our dietician on healthy eating habits and is currently down ~6#. She  is happy with the progress and states that she hopes to continue losing. Goal in progress on improving her shortness of breath with ADLs. Ramonita is maintaining her oxygen saturation of 88% or higher on 2L with exertion. She has improved both her workload and METS while maintain her O2. Skyler will continue to benefit from PR for nutrition, education, exercise, and lifestyle modification.     Expected Outcomes See admission goals For Lily to lose weight and to improve her shortness of breath with ADLs For Niesha to lose weight and to improve her shortness of breath with ADLs              Core Components/Risk Factors/Patient Goals at Discharge (Final Review):   Goals and Risk Factor Review - 02/07/23 1433       Core Components/Risk Factors/Patient Goals Review   Personal Goals Review Weight Management/Obesity;Improve shortness of breath with ADL's    Review Goal in progress to lose weight and make healthy choices. Adelade is working with our dietician on healthy eating habits and is currently down ~6#. She is happy with the progress and states that she hopes to continue losing. Goal in progress on improving her shortness of breath with ADLs. Abrey is maintaining her oxygen saturation of 88% or higher on 2L with exertion. She has improved both her workload and METS while maintain her O2. Elvenia will continue to benefit from PR for nutrition, education, exercise, and lifestyle modification.    Expected Outcomes For Janene to lose weight and to improve her shortness of breath with ADLs             ITP Comments: Pt is making expected progress toward Pulmonary Rehab goals after completing 19 sessions. Recommend continued exercise, life style modification, education, and utilization of breathing techniques to increase stamina and strength, while also decreasing shortness of breath  with exertion.  Dr. Mechele Collin is Medical Director for Pulmonary Rehab at [facility].

## 2023-02-17 ENCOUNTER — Encounter (HOSPITAL_COMMUNITY)
Admission: RE | Admit: 2023-02-17 | Discharge: 2023-02-17 | Disposition: A | Discharge status: Home / Self Care | Payer: 59 | Source: Ambulatory Visit | Attending: Internal Medicine | Admitting: Internal Medicine

## 2023-02-17 DIAGNOSIS — J449 Chronic obstructive pulmonary disease, unspecified: Secondary | ICD-10-CM

## 2023-02-17 LAB — GLUCOSE, CAPILLARY: Glucose-Capillary: 114 mg/dL — ABNORMAL HIGH (ref 70–99)

## 2023-02-17 NOTE — Progress Notes (Signed)
Daily Session Note  Patient Details  Name: Victoria [name] MRN: [id number] Date of Birth: 1946/01/18 Referring Provider:   Doristine Devoid Pulmonary Rehab Walk Test from 11/24/2022 in [facility]  Referring Provider Ramaswamy       Encounter Date: 02/17/2023  Check In:  Session Check In - 02/17/23 1138       Check-In   Supervising physician immediately available to respond to emergencies [facility] MD immediately available    Physician(s) Joni Reining, NP    Location [facility]    Staff Present Elissa Lovett BS, ACSM-CEP, Exercise Physiologist;Awa Bachicha Earlene Plater, MS, ACSM-CEP, Exercise Physiologist;Casey Synthia Innocent, RN, BSN    Virtual Visit No    Medication changes reported     No    Fall or balance concerns reported    No    Tobacco Cessation No Change    Warm-up and Cool-down Performed as group-led instruction    Resistance Training Performed Yes    VAD Patient? No    PAD/SET Patient? No      Pain Assessment   Currently in Pain? Yes    Pain Score 6     Pain Location Knee    Pain Orientation Right    Pain Descriptors / Indicators Aching    Multiple Pain Sites No             Capillary Blood Glucose: Results for orders placed or performed during the hospital encounter of 02/17/23 (from the past 24 hour(s))  Glucose, capillary     Status: Abnormal   Collection Time: 02/17/23 10:34 AM  Result Value Ref Range   Glucose-Capillary 114 (H) 70 - 99 mg/dL   *Note: Due to a large number of results and/or encounters for the requested time period, some results have not been displayed. A complete set of results can be found in Results Review.      Social History   Tobacco Use  Smoking Status Former   Current packs/day: 0.00   Average packs/day: 1 pack/day for 50.0 years (50.0 ttl pk-yrs)   Types: Cigarettes   Start date: 04/15/1961   Quit date: 04/16/2011   Years since quitting: 11.8   Smokeless Tobacco Never  Tobacco Comments   quit that date when she had to go to ER     Goals Met:  Proper associated with RPD/PD & O2 Sat Exercise tolerated well No report of concerns or symptoms today Strength training completed today  Goals Unmet:  Not Applicable  Comments: Service time is from 1015 to 1158.    Dr. Mechele Collin is Medical Director for Pulmonary Rehab at [facility].

## 2023-02-18 NOTE — Progress Notes (Signed)
Discharge Progress Report  Patient Details  Name: Victoria [name] MRN: [id number] Date of Birth: 01/08/1946 Referring Provider:   Doristine Devoid Pulmonary Rehab Walk Test from 11/24/2022 in [facility]  Referring Provider Ramaswamy        Number of Visits: 20  Reason for Discharge:  Patient has met program and personal goals.  Smoking History:  Social History   Tobacco Use  Smoking Status Former   Current packs/day: 0.00   Average packs/day: 1 pack/day for 50.0 years (50.0 ttl pk-yrs)   Types: Cigarettes   Start date: 04/15/1961   Quit date: 04/16/2011   Years since quitting: 11.8  Smokeless Tobacco Never  Tobacco Comments   quit that date when she had to go to ER     Diagnosis:  Stage 3 severe COPD by GOLD classification (HCC)  ADL [facility]:  Pulmonary Assessment Scores     Row Name 11/24/22 1120         ADL [facility]SD   SOB Score total 68       CAT Score   CAT Score 24       mMRC Score   mMRC Score 3              Initial Exercise Prescription:  Initial Exercise Prescription - 11/24/22 1200       Date of Initial Exercise RX and Referring Provider   Date 11/24/22    Referring Provider Ramaswamy    Expected Discharge Date 02/24/23      Oxygen   Oxygen Continuous    Liters 3    Maintain Oxygen Saturation 88% or higher      NuStep   Level 1    SPM 50    Minutes 30    METs 2      Prescription Details   Frequency (times per week) 2    Duration Progress to 30 minutes of continuous aerobic without signs/symptoms of physical distress      Intensity   THRR 40-80% of Max Heartrate 57-114    Ratings of Perceived Exertion 11-13    Perceived Dyspnea 0-4      Progression   Progression Continue progressive overload as per policy without signs/symptoms or physical distress.      Resistance Training   Training Prescription Yes    Weight red bands    Reps 10-15             Discharge  Exercise Prescription (Final Exercise Prescription Changes):  Exercise Prescription Changes - 02/15/23 1100       Response to Exercise   Blood Pressure (Admit) 134/50    Blood Pressure (Exercise) 156/60    Blood Pressure (Exit) 122/50    Heart Rate (Admit) 85 bpm    Heart Rate (Exercise) 95 bpm    Heart Rate (Exit) 88 bpm    Oxygen Saturation (Admit) 98 %    Oxygen Saturation (Exercise) 95 %    Oxygen Saturation (Exit) 99 %    Rating of Perceived Exertion (Exercise) 12    Perceived Dyspnea (Exercise) 3    Duration Continue with 30 min of aerobic exercise without signs/symptoms of physical distress.    Intensity THRR unchanged      Progression   Progression Continue to progress workloads to maintain intensity without signs/symptoms of physical distress.      Resistance Training   Training Prescription Yes    Weight red bands    Reps 10-15  Time 10 Minutes      Oxygen   Oxygen Continuous    Liters 2      NuStep   Level 3    Minutes 15    METs 2      Track   Laps 6    Minutes 15    METs 1.92      Oxygen   Maintain Oxygen Saturation 88% or higher             Functional Capacity:  6 Minute Walk     Row Name 11/24/22 1258 02/17/23 1638       6 Minute Walk   Phase Initial Discharge    Distance 427 feet 770 feet    Distance % Change -- 80.33 %    Distance Feet Change -- 343 ft    Walk Time 6 minutes 6 minutes    # of Rest Breaks 2  stopped at 1:10, re-started at 2:30, stopped at 4:12, re-started at 5:20 1  2:34-4:09    MPH 0.81 1.46    METS 1.05 0.78    RPE 15 13    Perceived Dyspnea  3 3    VO2 Peak 3.67 2.27    Symptoms Yes (comment) Yes (comment)    Comments dyspnea 6/10 rt knee pain    Resting HR 82 bpm 79 bpm    Resting BP 158/78 148/62    Resting Oxygen Saturation  92 % 99 %    Exercise Oxygen Saturation  during 6 min walk 85 % 95 %    Max Ex. HR 105 bpm 108 bpm    Max Ex. BP 192/66 150/64    2 Minute Post BP 151/72 132/60      Interval  HR   1 Minute HR 98 98    2 Minute HR 93 108    3 Minute HR 92 102    4 Minute HR 105 91    5 Minute HR 95 99    6 Minute HR 101 102    2 Minute Post HR 86 79    Interval Heart Rate? Yes Yes      Interval Oxygen   Interval Oxygen? Yes Yes    Baseline Oxygen Saturation % 92 % 99 %    1 Minute Oxygen Saturation % 91 % 96 %    1 Minute Liters of Oxygen 0 L 2 L    2 Minute Oxygen Saturation % 86 %  O2 dropped to 86% at 1:28 99 %    2 Minute Liters of Oxygen 1 L 2 L    3 Minute Oxygen Saturation % 85 % 97 %    3 Minute Liters of Oxygen 2 L 2 L    4 Minute Oxygen Saturation % 94 % 96 %    4 Minute Liters of Oxygen 2 L 2 L    5 Minute Oxygen Saturation % 97 % 99 %    5 Minute Liters of Oxygen 2 L 2 L    6 Minute Oxygen Saturation % 97 % 95 %    6 Minute Liters of Oxygen 2 L 2 L    2 Minute Post Oxygen Saturation % 99 % 99 %    2 Minute Post Liters of Oxygen 2 L 2 L             Psychological, QOL, Others - Outcomes: PHQ 2/9:    11/24/2022   11:55 AM 08/19/2022    4:45 PM 06/02/2022  9:55 AM 05/29/2021    8:51 AM 02/20/2020    1:01 PM  Depression screen PHQ 2/9  Decreased Interest 1 0 0 0 0  Down, Depressed, Hopeless 0 0 0 0 1  PHQ - 2 Score 1 0 0 0 1  Altered sleeping 0      Tired, decreased energy 1      Change in appetite 0      Feeling bad or failure about yourself  0      Trouble concentrating 0      Moving slowly or fidgety/restless 1      Suicidal thoughts 0      PHQ-9 Score 3      Difficult doing work/chores Not difficult at all        Quality of Life:   Personal Goals: Goals established at orientation with interventions provided to work toward goal.  Personal Goals and Risk Factors at Admission - 11/24/22 1150       Core Components/Risk Factors/Patient Goals on Admission    Weight Management Yes    Intervention Weight Management: Develop a combined nutrition and exercise program designed to reach desired caloric intake, while maintaining appropriate  intake of nutrient and fiber, sodium and fats, and appropriate energy expenditure required for the weight goal.;Weight Management: Provide education and appropriate resources to help participant work on and attain dietary goals.;Weight Management/Obesity: Establish reasonable short term and long term weight goals.;Obesity: Provide education and appropriate resources to help participant work on and attain dietary goals.    Expected Outcomes Short Term: Continue to assess and modify interventions until short term weight is achieved;Long Term: Adherence to nutrition and physical activity/exercise program aimed toward attainment of established weight goal;Weight Loss: Understanding of general recommendations for a balanced deficit meal plan, which promotes 1-2 lb weight loss per week and includes a negative energy balance of [phone] kcal/d;Understanding recommendations for meals to include 15-35% energy as protein, 25-35% energy from fat, 35-60% energy from carbohydrates, less than 200mg  of dietary cholesterol, 20-35 gm of total fiber daily;Understanding of distribution of calorie intake throughout the day with the consumption of 4-5 meals/snacks    Improve shortness of breath with ADL's Yes    Intervention Provide education, individualized exercise plan and daily activity instruction to help decrease symptoms of SOB with activities of daily living.    Expected Outcomes Short Term: Improve cardiorespiratory fitness to achieve a reduction of symptoms when performing ADLs;Long Term: Be able to perform more ADLs without symptoms or delay the onset of symptoms    Increase knowledge of respiratory medications and ability to use respiratory devices properly  Yes    Intervention Provide education and demonstration as needed of appropriate use of medications, inhalers, and oxygen therapy.    Expected Outcomes Short Term: Achieves understanding of medications use. Understands that oxygen is a medication prescribed by  physician. Demonstrates appropriate use of inhaler and oxygen therapy.;Long Term: Maintain appropriate use of medications, inhalers, and oxygen therapy.              Personal Goals Discharge:  Goals and Risk Factor Review     Row Name 12/17/22 1053 01/10/23 1443 02/07/23 1433 02/18/23 1042       Core Components/Risk Factors/Patient Goals Review   Personal Goals Review Weight Management/Obesity;Improve shortness of breath with ADL's;Develop more efficient breathing techniques such as purse lipped breathing and diaphragmatic breathing and practicing self-pacing with activity.;Increase knowledge of respiratory medications and ability to use respiratory devices properly. Weight Management/Obesity;Improve shortness of  breath with ADL's;Develop more efficient breathing techniques such as purse lipped breathing and diaphragmatic breathing and practicing self-pacing with activity.;Increase knowledge of respiratory medications and ability to use respiratory devices properly. Weight Management/Obesity;Improve shortness of breath with ADL's Improve shortness of breath with ADL's    Review Goal progressing for weight loss. Elmarie has been working with our dietician for weight loss goals. Goal progressing on improving her shortness of breath with ADLs. Goal progressing on developing more efficient breathing techniques such as purse lipped breathing and diaphragmatic breathing; and practicing self-pacing with activity. Goal progressing for increasing knowledge of respiratory medications and ability to use respiratory devices properly. Goal in progress to lose weight and make healthy choices. Prerana is working with our dietician on healthy eating habits and is currently down ~3#. Goal in progress on improving her shortness of breath with ADLs. Jasmarie is maintaining her oxygen saturation of 88% or higher on 2L with exertion. Goal met on developing more efficient breathing techniques such as purse lipped breathing  and diaphragmatic breathing; and practicing self-pacing with activity. Ikesha can initiate these interventions without staff prompting. She can pace herself while walking. Goal met for increasing her knowledge of respiratory medications and ability to use respiratory devices properly. She has correctly demonstrated and voiced when to use her medications with our respiratory therapist. She will continue to benefit from PR for nutrition, education, exercise, and lifestyle modification. Goal in progress to lose weight and make healthy choices. Lakyla is working with our dietician on healthy eating habits and is currently down ~6#. She is happy with the progress and states that she hopes to continue losing. Goal in progress on improving her shortness of breath with ADLs. Paraskevi is maintaining her oxygen saturation of 88% or higher on 2L with exertion. She has improved both her workload and METS while maintain her O2. Yasmyne will continue to benefit from PR for nutrition, education, exercise, and lifestyle modification. Galilea graduated from the PR program on 02/17/23. She met her weight loss goals. Unable to determine if she met her improve shortness of breath with ADL's due to her not completing her post test. Zeyna made great improvements and we are proud of her.    Expected Outcomes See admission goals For [place] to lose weight and to improve her shortness of breath with ADLs For Nour to lose weight and to improve her shortness of breath with ADLs To continue to exercise and modify her nutrition and lifestyle post graduation             Exercise Goals and Review:  Exercise Goals     Row Name 11/24/22 1103 12/10/22 1133 01/12/23 1132         Exercise Goals   Increase Physical Activity Yes Yes Yes     Intervention Provide advice, education, support and counseling about physical activity/exercise needs.;Develop an individualized exercise prescription for aerobic and resistive training based on  initial evaluation findings, risk stratification, comorbidities and participant's personal goals. Provide advice, education, support and counseling about physical activity/exercise needs.;Develop an individualized exercise prescription for aerobic and resistive training based on initial evaluation findings, risk stratification, comorbidities and participant's personal goals. Provide advice, education, support and counseling about physical activity/exercise needs.;Develop an individualized exercise prescription for aerobic and resistive training based on initial evaluation findings, risk stratification, comorbidities and participant's personal goals.     Expected Outcomes Short Term: Attend rehab on a regular basis to increase amount of physical activity.;Long Term: Exercising regularly at least 3-5 days a  week.;Long Term: Add in home exercise to make exercise part of routine and to increase amount of physical activity. Short Term: Attend rehab on a regular basis to increase amount of physical activity.;Long Term: Exercising regularly at least 3-5 days a week.;Long Term: Add in home exercise to make exercise part of routine and to increase amount of physical activity. Short Term: Attend rehab on a regular basis to increase amount of physical activity.;Long Term: Exercising regularly at least 3-5 days a week.;Long Term: Add in home exercise to make exercise part of routine and to increase amount of physical activity.     Increase Strength and Stamina Yes Yes Yes     Intervention Provide advice, education, support and counseling about physical activity/exercise needs.;Develop an individualized exercise prescription for aerobic and resistive training based on initial evaluation findings, risk stratification, comorbidities and participant's personal goals. Provide advice, education, support and counseling about physical activity/exercise needs.;Develop an individualized exercise prescription for aerobic and resistive  training based on initial evaluation findings, risk stratification, comorbidities and participant's personal goals. Provide advice, education, support and counseling about physical activity/exercise needs.;Develop an individualized exercise prescription for aerobic and resistive training based on initial evaluation findings, risk stratification, comorbidities and participant's personal goals.     Expected Outcomes Short Term: Increase workloads from initial exercise prescription for resistance, speed, and METs.;Short Term: Perform resistance training exercises routinely during rehab and add in resistance training at home;Long Term: Improve cardiorespiratory fitness, muscular endurance and strength as measured by increased METs and functional capacity ( ) Short Term: Increase workloads from initial exercise prescription for resistance, speed, and METs.;Short Term: Perform resistance training exercises routinely during rehab and add in resistance training at home;Long Term: Improve cardiorespiratory fitness, muscular endurance and strength as measured by increased METs and functional capacity ( ) Short Term: Increase workloads from initial exercise prescription for resistance, speed, and METs.;Short Term: Perform resistance training exercises routinely during rehab and add in resistance training at home;Long Term: Improve cardiorespiratory fitness, muscular endurance and strength as measured by increased METs and functional capacity ( )     Able to understand and use rate of perceived exertion (RPE) scale Yes Yes Yes     Intervention Provide education and explanation on how to use RPE scale Provide education and explanation on how to use RPE scale Provide education and explanation on how to use RPE scale     Expected Outcomes Short Term: Able to use RPE daily in rehab to express subjective intensity level;Long Term:  Able to use RPE to guide intensity level when exercising independently Short Term: Able  to use RPE daily in rehab to express subjective intensity level;Long Term:  Able to use RPE to guide intensity level when exercising independently Short Term: Able to use RPE daily in rehab to express subjective intensity level;Long Term:  Able to use RPE to guide intensity level when exercising independently     Able to understand and use Dyspnea scale Yes Yes Yes     Intervention Provide education and explanation on how to use Dyspnea scale Provide education and explanation on how to use Dyspnea scale Provide education and explanation on how to use Dyspnea scale     Expected Outcomes Short Term: Able to use Dyspnea scale daily in rehab to express subjective sense of shortness of breath during exertion;Long Term: Able to use Dyspnea scale to guide intensity level when exercising independently Short Term: Able to use Dyspnea scale daily in rehab to express subjective sense of shortness of breath during  exertion;Long Term: Able to use Dyspnea scale to guide intensity level when exercising independently Short Term: Able to use Dyspnea scale daily in rehab to express subjective sense of shortness of breath during exertion;Long Term: Able to use Dyspnea scale to guide intensity level when exercising independently     Knowledge and understanding of Target Heart Rate Range (THRR) Yes Yes Yes     Intervention Provide education and explanation of THRR including how the numbers were predicted and where they are located for reference Provide education and explanation of THRR including how the numbers were predicted and where they are located for reference Provide education and explanation of THRR including how the numbers were predicted and where they are located for reference     Expected Outcomes Short Term: Able to state/look up THRR;Short Term: Able to use daily as guideline for intensity in rehab;Long Term: Able to use THRR to govern intensity when exercising independently Short Term: Able to state/look up  THRR;Short Term: Able to use daily as guideline for intensity in rehab;Long Term: Able to use THRR to govern intensity when exercising independently Short Term: Able to state/look up THRR;Short Term: Able to use daily as guideline for intensity in rehab;Long Term: Able to use THRR to govern intensity when exercising independently     Understanding of Exercise Prescription Yes Yes Yes     Intervention Provide education, explanation, and written materials on patient's individual exercise prescription Provide education, explanation, and written materials on patient's individual exercise prescription Provide education, explanation, and written materials on patient's individual exercise prescription     Expected Outcomes Short Term: Able to explain program exercise prescription;Long Term: Able to explain home exercise prescription to exercise independently Short Term: Able to explain program exercise prescription;Long Term: Able to explain home exercise prescription to exercise independently Short Term: Able to explain program exercise prescription;Long Term: Able to explain home exercise prescription to exercise independently              Exercise Goals Re-Evaluation:  Exercise Goals Re-Evaluation     Row Name 12/10/22 1134 01/12/23 1132 02/09/23 0713         Exercise Goal Re-Evaluation   Exercise Goals Review Increase Physical Activity;Able to understand and use Dyspnea scale;Understanding of Exercise Prescription;Increase Strength and Stamina;Knowledge and understanding of Target Heart Rate Range (THRR);Able to understand and use rate of perceived exertion (RPE) scale Increase Physical Activity;Able to understand and use Dyspnea scale;Understanding of Exercise Prescription;Increase Strength and Stamina;Knowledge and understanding of Target Heart Rate Range (THRR);Able to understand and use rate of perceived exertion (RPE) scale Increase Physical Activity;Able to understand and use Dyspnea  scale;Understanding of Exercise Prescription;Increase Strength and Stamina;Knowledge and understanding of Target Heart Rate Range (THRR);Able to understand and use rate of perceived exertion (RPE) scale     Comments Lenita has conpleted 2 exercises sessions. She exercises for 30 min on the Nustep. Jaquelin averages 1.5 METs at level 1 on the Nustep. She performs the warmup and cooldown mostly seated due to orthopedic limitaitons and deconditioning. Lanese is very deconditioned. It is too soon to note any discernable progressions. Will continue to monitor and progress as able. Sanika has conpleted 11 exercises sessions. She exercises for 30 min on the Nustep. [place] averages 1.8 METs at level 3 on the Nustep. She performs the warmup and cooldown mostly seated due to orthopedic limitaitons and deconditioning. Archana has improved her walking ability since starting. She walks in and out from the parking lot using a rollator. She seems  motivated to improve her conditioning as we will discuss walking for 15 min during class. Will continue to monitor and progress as able. Maiyah has conpleted 18 exercises sessions. She exercises for 15 min on the Nustep and track. Leighana averages 1.8 MEts at level 3 on the Nustep and 1.31 METs on the track. She performs the warmup and cooldown standing/ seated dependent on her shortness o breath. Hunter has progressed to walking the track for 15 min. She tolerates track walking well and understands when to take rest breaks. Her level and METs have remained relatively the same on the Nustep. Will continue to monitor and progress as able.     Expected Outcomes Through exercise at rehab and home, the patient will decrease shortness of breath with daily activities and feel confident in carrying out an exercise regimen at home. Through exercise at rehab and home, the patient will decrease shortness of breath with daily activities and feel confident in carrying out an exercise regimen at home.  Through exercise at rehab and home, the patient will decrease shortness of breath with daily activities and feel confident in carrying out an exercise regimen at home.              Nutrition & Weight - Outcomes:    Nutrition:  Nutrition Therapy & Goals - 02/01/23 1141       Nutrition Therapy   Diet Heart Healthy Diet    Drug/Food Interactions Statins/Certain Fruits      Personal Nutrition Goals   Nutrition Goal Patient to improve diet quality by using the plate method as a guide for meal planning to include lean protein/plant protein, fruits, vegetables, whole grains, nonfat dairy as part of a well-balanced diet   in progress.   Personal Goal #2 Patient to identify strategies for weight loss of 0.5-2.0# per week.   in progress.   Comments Goals in progress. Abagale has met with an outside dietitian (08/23/2022) to aid with improving eating habits and weight loss efforts (210# at that appointment). She is down ~10# since April of 2024; she is down an additional 5# since starting with our program.  Her A1c has improved to 6.1 in a pre-diabetic range. Yides will continue to benefit from participation in pulmonary rehab for nutrition, exercise, and lifestyle modification.      Intervention Plan   Intervention Prescribe, educate and counsel regarding individualized specific dietary modifications aiming towards targeted core components such as weight, hypertension, lipid management, diabetes, heart failure and other comorbidities.;Nutrition handout(s) given to patient.    Expected Outcomes Short Term Goal: Understand basic principles of dietary content, such as calories, fat, sodium, cholesterol and nutrients.;Long Term Goal: Adherence to prescribed nutrition plan.             Nutrition Discharge:   Education Questionnaire Score:  Knowledge Questionnaire Score - 11/24/22 1358       Knowledge Questionnaire Score   Pre Score 14/18             Goals reviewed with patient; copy  given to patient.

## 2023-02-22 ENCOUNTER — Ambulatory Visit (HOSPITAL_COMMUNITY): Payer: 59

## 2023-02-24 ENCOUNTER — Other Ambulatory Visit: Payer: Self-pay | Admitting: Internal Medicine

## 2023-02-24 ENCOUNTER — Ambulatory Visit (HOSPITAL_COMMUNITY): Payer: 59

## 2023-02-24 DIAGNOSIS — M839 Adult osteomalacia, unspecified: Secondary | ICD-10-CM

## 2023-02-25 NOTE — Telephone Encounter (Signed)
Yes that is fine

## 2023-03-01 ENCOUNTER — Ambulatory Visit (HOSPITAL_COMMUNITY): Payer: 59

## 2023-03-02 ENCOUNTER — Telehealth: Payer: Self-pay | Admitting: Internal Medicine

## 2023-03-02 NOTE — Telephone Encounter (Signed)
LMOM for patient to return call to see who I needed to talk to about this.

## 2023-03-02 NOTE — Telephone Encounter (Signed)
Patient is returning phone call. Patient phone number is [phone].

## 2023-03-04 ENCOUNTER — Other Ambulatory Visit: Payer: Self-pay | Admitting: Internal Medicine

## 2023-03-04 DIAGNOSIS — M839 Adult osteomalacia, unspecified: Secondary | ICD-10-CM

## 2023-03-04 NOTE — Telephone Encounter (Signed)
Pt needs transportation to pulmonary rehab for the extended part

## 2023-03-08 ENCOUNTER — Ambulatory Visit (HOSPITAL_COMMUNITY): Payer: 59

## 2023-03-08 NOTE — Telephone Encounter (Signed)
ATC patient- unable to leave vm due to mailbox being full.   

## 2023-03-14 NOTE — Telephone Encounter (Signed)
I called and spoke with the pt  She states that she has joined [facility] and is doing chair exercises there rather than continuing rehab  She did graduate from pulmonary rehab on 02/17/23    She is asking about her CT Chest results from 12/17/22  Please advise, thanks!

## 2023-03-14 NOTE — Telephone Encounter (Signed)
 +   CT chest August 2024 with emphysema but no lung cancer no fibrosis no pneumonia.    IMPRESSION: 1.  Emphysema (ICD10-J43.9). 2. Bilateral adrenal adenomas. 3. Punctate right renal stone. 4. Aortic atherosclerosis (ICD10-I70.0). Coronary artery calcification. 5. Enlarged pulmonic trunk, indicative of pulmonary arterial hypertension.     Electronically Signed   By: Leanna Battles M.D.   On: 12/23/2022 11:20

## 2023-03-15 NOTE — Telephone Encounter (Signed)
I called and spoke with the pt and notified of response per MR  She verbalized understanding  Nothing further needed

## 2023-03-30 ENCOUNTER — Telehealth: Payer: Self-pay

## 2023-03-30 DIAGNOSIS — E118 Type 2 diabetes mellitus with unspecified complications: Secondary | ICD-10-CM

## 2023-03-30 MED ORDER — ACCU-CHEK AVIVA PLUS W/DEVICE KIT
1.0000 | PACK | Freq: Every day | 0 refills | Status: DC
Start: 2023-03-30 — End: 2023-06-10

## 2023-03-30 MED ORDER — ACCU-CHEK AVIVA PLUS VI STRP: ORAL_STRIP | 5 refills | Status: DC

## 2023-03-30 MED ORDER — ACCU-CHEK SOFTCLIX LANCETS MISC
11 refills | Status: DC
Start: 2023-03-30 — End: 2024-03-12

## 2023-03-30 NOTE — Telephone Encounter (Signed)
Attempted to contact patient no answer and vm is full

## 2023-05-02 ENCOUNTER — Telehealth: Payer: Self-pay | Admitting: Internal Medicine

## 2023-05-02 NOTE — Telephone Encounter (Signed)
Contacted [facility] to schedule their annual wellness visit. Patient declined to schedule AWV at this time. Transferred care to Dr. Hattie Perch Care Guide [facility] AWV TEAM Direct Dial: [phone]

## 2023-06-03 ENCOUNTER — Other Ambulatory Visit: Payer: Self-pay

## 2023-06-03 DIAGNOSIS — E118 Type 2 diabetes mellitus with unspecified complications: Secondary | ICD-10-CM

## 2023-06-03 MED ORDER — ACCU-CHEK AVIVA PLUS VI STRP
ORAL_STRIP | 5 refills | Status: DC
Start: 2023-06-03 — End: 2024-03-12

## 2023-06-10 ENCOUNTER — Other Ambulatory Visit: Payer: Self-pay

## 2023-06-10 DIAGNOSIS — E118 Type 2 diabetes mellitus with unspecified complications: Secondary | ICD-10-CM

## 2023-06-10 MED ORDER — ACCU-CHEK AVIVA PLUS W/DEVICE KIT
PACK | 0 refills | Status: DC
Start: 2023-06-10 — End: 2023-06-10

## 2023-06-10 MED ORDER — ACCU-CHEK AVIVA PLUS W/DEVICE KIT
PACK | 0 refills | Status: DC
Start: 2023-06-10 — End: 2024-03-12

## 2023-06-16 ENCOUNTER — Ambulatory Visit
Admission: RE | Admit: 2023-06-16 | Discharge: 2023-06-16 | Disposition: A | Discharge status: Home / Self Care | Payer: 59 | Source: Ambulatory Visit | Attending: Nurse Practitioner | Admitting: Nurse Practitioner

## 2023-06-16 DIAGNOSIS — M810 Age-related osteoporosis without current pathological fracture: Secondary | ICD-10-CM

## 2023-07-19 ENCOUNTER — Ambulatory Visit: Admitting: Internal Medicine

## 2023-07-19 NOTE — Progress Notes (Deleted)
 Name: Victoria [name]  Age/ Sex: 78 y.o., female   MRN/ DOB: [id number], [birth year]     PCP: Cristino Martes, NP   Reason for Endocrinology Evaluation: Type 2 Diabetes Mellitus  Initial Endocrine Consultative Visit: 02/28/2014    PATIENT IDENTIFIER: Victoria [name] is a 78 y.o. female with a past medical history of OSA, COPD, HTN, Hx of breast Ca. The patient has followed with Endocrinology clinic since 02/28/2014 for consultative assistance with management of her diabetes.  DIABETIC HISTORY:  Ms. Janeway was diagnosed with DM 2017. Her hemoglobin A1c has ranged from 6.3% in 2022, peaking at 10.3% in 2020.   PCP prescribed Farxiga 05/2022 but pt developed dizziness to 10 mg dose.    THYROID HISTORY: The patient has been diagnosed with hyperthyroid in 2019.  Thyroid ultrasound showed multinodular goiter.   She is s/p benign FNA of the isthmic nodule in May 2006 and in 2018 She is S/P benign FNA of right mid thyroid nodule 02/2015 and in 2019   Thyroid ultrasound on 03/02/2021 showed 6 year stability of multiple thyroid nodules and NO further ultrasound was recommended  She is S/P RAI ablation 31.5 mCi I-131 sodium iodide orally  03/19/2019  TFT's have been normal    SUBJECTIVE:   During the last visit (01/25/2023): A1c 6.1%   Today (07/19/2023): Victoria [name]  is here for a follow up on diabetes management. She checks her blood sugars 1 times daily. The patient has not had hypoglycemic episodes since the last clinic visit  She continues with  pulmonary PT for COPD  She continues to follow-up with oncology for history of estrogen receptor positive breast cancer  She is on oxygen 24 hrs a day  She denies nausea or vomiting except last week she had an episode of nausea  She was prescribed farxiga by PCP but she developed dizziness  Denies constipation nor diarrhea  She has history of aortic aneurysm  HOME DIABETES REGIMEN:  Janumet 50-1000 2 tabs daily  Farxiga 10  mg daily    Statin: yes ACE-I/ARB: no   METER DOWNLOAD SUMMARY: Did not bring        DIABETIC COMPLICATIONS: Microvascular complications:   Denies: CKD, retinopathy  Last Eye Exam: Completed 2022  Macrovascular complications:   Denies: CAD, CVA, PVD   HISTORY:  Past Medical History:  Past Medical History:  Diagnosis Date   Abdominal aneurysm (HCC)    Dr. Darrick Penna follows lLOV 2 ''17 per pt "around 2 cm"   Anemia    as a child   Arthritis    left ankle, right knee, right SI joint, wrists, lower back   Breast cancer in female [facility])    Left   COPD (chronic obstructive pulmonary disease) (HCC)    ephysema-Dr. 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 Gerrit Friends in OR [place]   Sleep apnea    cpap used sometimes, uses oxygen concentrator 2 l/m nasally bedtime   Varicella as child  Past Surgical History:  Past Surgical History:  Procedure Laterality Date   APPENDECTOMY     2008   BREAST LUMPECTOMY Left 08/31/2017   BREAST LUMPECTOMY WITH RADIOACTIVE SEED LOCALIZATION Left 08/10/2017   Procedure: BREAST LUMPECTOMY WITH RADIOACTIVE SEED LOCALIZATION;  Surgeon: Emelia Loron, MD;  [facility] OR;  Service: General;  Laterality: Left;   CESAREAN SECTION     1972   COLONOSCOPY     COLONOSCOPY WITH PROPOFOL N/A 04/16/2016   Procedure: COLONOSCOPY WITH PROPOFOL;  Surgeon: Jeani Hawking, MD;  Location: [facility]COPY;  Service: Endoscopy;  Laterality: N/A;   CYST REMOVAL NECK Left 04/13/2016   Procedure: EXCISION OF SEBACEOUS CYST LEFT POSTERIOR NECK;  Surgeon: Darnell Level, MD;   Location: [facility] OR;  Service: General;  Laterality: Left;   Excision of Pelvic Absess, Right Ovary     2008   RE-EXCISION OF BREAST CANCER,SUPERIOR MARGINS Left 08/31/2017   Procedure: RE-EXCISION OF LEFT  BREAST MARGINS ERAS PATHWAY;  Surgeon: Emelia Loron, MD;  Location: [facility] OR;  Service: General;  Laterality: Left;   TUBAL LIGATION  1980   Social History:  reports that she quit smoking about 12 years ago. Her smoking use included cigarettes. She started smoking about 62 years ago. She has a 50 pack-year smoking history. She has never used smokeless tobacco. She reports current alcohol use. She reports that she does not use drugs. Family History:  Family History  Problem Relation Age of Onset   Heart disease Mother        MI - fatal   Hypertension Mother    Stroke Father 68       fatal   Alzheimer's disease Father    Alzheimer's disease Brother    Hyperlipidemia Brother    Hypertension Brother    Diabetes Brother    Hypertension Brother    Hyperlipidemia Brother    Breast cancer Paternal Grandmother      HOME MEDICATIONS: Allergies as of 07/19/2023       Reactions   Benicar [olmesartan] Swelling   Swelling of face and arms    [name] [valsartan] Swelling   Swelling of face and arms    Hydrocodone-acetaminophen Nausea And Vomiting   Severe vomiting   Lisinopril Cough   Monosodium Glutamate Other (See Comments)   Facial swelling per pt   Codeine Other (See Comments)   jittery   Lead Acetate Rash   Nickel Rash   Severe rash to infection: pt is allergic to all metals other than sterling silver or gold jewelry.         Medication List        Accurate as of July 19, 2023  9:22 AM. If you have any questions, ask your nurse or doctor.          Accu-Chek Aviva Plus w/Device Kit Use to monitor glucose levels once per day; E11.9   Accu-Chek Softclix Lancets lancets Use as instructed to check blood sugar 1x daily   acetaminophen 500 MG tablet Commonly known as:  TYLENOL Take 1,000 mg by mouth every 6 (six) hours as needed.   amLODipine 10 MG tablet Commonly known as: NORVASC TAKE ONE TABLET BY MOUTH ONCE DAILY   aspirin EC 81 MG tablet Take 1 tablet (81 mg total) by mouth daily.   carvedilol 6.25 MG tablet Commonly known as: COREG TAKE ONE TABLET BY MOUTH EVERY MORNING and TAKE ONE TABLET BY MOUTH EVERYDAY AT BEDTIME   dapagliflozin propanediol 10 MG Tabs tablet Commonly known as:  Farxiga Take 1 tablet (10 mg total) by mouth daily.   glucose blood test strip USE 1 STRIP ONCE DAILY   Accu-Chek Guide test strip Generic drug: glucose blood USE 1 STRIP ONCE DAILY   Accu-Chek Aviva Plus test strip Generic drug: glucose blood USE TO Check blood glucose 1x DAILY   Janumet XR 50-1000 MG Tb24 Generic drug: SitaGLIPtin-MetFORMIN HCl Take 2 tablets by mouth daily before supper.   methocarbamol 500 MG tablet Commonly known as: ROBAXIN Take 1 tablet (500 mg total) by mouth every 8 (eight) hours as needed for muscle spasms.   OXYGEN Inhale 2-3 L into the lungs continuous. When exerting self   Potassium Chloride ER 20 MEQ Tbcr Take 1 tablet by mouth every morning.   potassium chloride SA 20 MEQ tablet Commonly known as: KLOR-CON M Take 1 tablet (20 mEq total) by mouth 2 (two) times daily.   ProAir RespiClick 108 (90 Base) MCG/ACT Aepb Generic drug: Albuterol Sulfate Inhale 1-2 puffs into the lungs every 6 (six) hours as needed (for wheezing/shortness of breath).   albuterol (2.5 MG/3ML) 0.083% nebulizer solution Commonly known as: PROVENTIL Take 3 mLs (2.5 mg total) by nebulization every 6 (six) hours as needed for wheezing or shortness of breath.   Prolia 60 MG/ML Sosy injection Generic drug: denosumab Inject 60 mg into the skin every 6 (six) months.   roflumilast 500 MCG Tabs tablet Commonly known as: DALIRESP TAKE ONE TABLET BY MOUTH EVERY MORNING   rosuvastatin 40 MG tablet Commonly known as: CRESTOR TAKE ONE TABLET BY  MOUTH EVERYDAY AT BEDTIME   spironolactone 25 MG tablet Commonly known as: ALDACTONE TAKE ONE TABLET BY MOUTH ONCE DAILY   thiamine 100 MG tablet Commonly known as: VITAMIN B1 TAKE 1 TABLET BY MOUTH EVERY OTHER DAY   Trelegy Ellipta 100-62.5-25 MCG/ACT Aepb Generic drug: Fluticasone-Umeclidin-Vilant INHALE ONE (1) PUFF BY MOUTH AND INTO THE LUNGS DAILY *REFILL REQUEST*   Vitamin D3 125 MCG (5000 UT) Caps TAKE ONE CAPSULE BY MOUTH ONCE DAILY         OBJECTIVE:   Vital Signs: There were no vitals taken for this visit.  Wt Readings from Last 3 Encounters:  02/15/23 194 lb 3.6 oz (88.1 kg)  02/01/23 195 lb 1.7 oz (88.5 kg)  01/25/23 192 lb (87.1 kg)     Exam: General: Pt appears well and is in NAD  Lungs: Clear with good BS bilat   Heart: RRR   Extremities: No pretibial edema.   Neuro: MS is good with appropriate affect, pt is alert and Ox3    DM foot exam: 01/25/2023  The skin of the feet is without sores or ulcerations, multiple callus formation noted b/l The pedal pulses are 2+ on right and 2+ on left. The sensation is intact to a screening 5.07, 10 gram monofilament bilaterally        DATA REVIEWED:  Lab Results  Component Value Date   HGBA1C 6.1 (A) 01/25/2023   HGBA1C 7.2 (A) 07/19/2022   HGBA1C 7.1 (A) 03/25/2022    Latest Reference Range & Units 06/15/22 15:11  Sodium 135 - 145 mEq/L 138  Potassium 3.5 - 5.1 mEq/L 4.1  Chloride 96 - 112 mEq/L 99  CO2 19 - 32 mEq/L 34 (H)  Glucose 70 - 99 mg/dL 161 (H)  BUN 6 - 23 mg/dL 24 (H)  Creatinine 0.96 - 1.20 mg/dL 0.45  Calcium 8.4 - 40.9 mg/dL 9.9  GFR >81.19 mL/min 58.93 (L)    In office  BG 103 mg/dL   ASSESSMENT / PLAN / RECOMMENDATIONS:   1) Type 2 Diabetes Mellitus, Optimally controlled, Without complications - Most recent A1c of 6.1 %. Goal A1c < 7.5 %.    -A1c remains at goal -She has been able to tolerate a full dose of Farxiga after gradually increasing the dose -No changes at this  time, but if her A1c continues to be below 6.5% we may consider decreasing Janumet   MEDICATIONS: Continue Janumet 50-1000 mg, 2 tabs before supper Continue Farxiga 10 mg, 1 tab every morning  EDUCATION / INSTRUCTIONS: BG monitoring instructions: Patient is instructed to check her blood sugars 1 times a day, fasting. Call [place] [facility] if: BG persistently < 70  I reviewed the Rule of 15 for the treatment of hypoglycemia in detail with the patient. Literature supplied.    2) Diabetic complications:  Eye: Does not have known diabetic retinopathy.  Neuro/ Feet: Does not have known diabetic peripheral neuropathy .  Renal: Patient does not have known baseline CKD. She   is not on an ACEI/ARB at present.     F/U in 6 months      Signed electronically by: Lyndle Herrlich, MD  [facility]  [facility] [address] [place], [place] [address] Phone: [phone] FAX: [phone]   CC: Cristino Martes, NP [address] Le[address] [place] [address] Phone: [phone]  Fax: [phone]  Return to Endocrinology clinic as below: Future Appointments  Date Time Provider Department Center  07/19/2023  1:40 PM Gracy Ehly, Konrad Dolores, MD [facility] None  09/16/2023 11:30 AM Rachel Moulds, MD [facility] None

## 2023-07-26 ENCOUNTER — Ambulatory Visit: Payer: 59 | Admitting: Internal Medicine

## 2023-08-09 LAB — LAB REPORT - SCANNED
Albumin, Urine POC: 22
Albumin/Creatinine Ratio, Urine, POC: 191
Creatinine, POC: 115 mg/dL

## 2023-08-16 ENCOUNTER — Ambulatory Visit: Admitting: Internal Medicine

## 2023-08-16 NOTE — Progress Notes (Deleted)
 Name: Alberto Schoch  Age/ Sex: 78 y.o., female   MRN/ DOB: [id number], August 13, 1945     PCP: Cristino Martes, NP   Reason for Endocrinology Evaluation: Type 2 Diabetes Mellitus  Initial Endocrine Consultative Visit: 02/28/2014    PATIENT IDENTIFIER: Victoria [name] is a 78 y.o. female with a past medical history of OSA, COPD, HTN, Hx of breast Ca. The patient has followed with Endocrinology clinic since 02/28/2014 for consultative assistance with management of her diabetes.  DIABETIC HISTORY:  Ms. Hanke was diagnosed with DM 2017. Her hemoglobin A1c has ranged from 6.3% in 2022, peaking at 10.3% in 2020.   PCP prescribed Farxiga 05/2022 but pt developed dizziness to 10 mg dose.    THYROID HISTORY: The patient has been diagnosed with hyperthyroid in 2019.  Thyroid ultrasound showed multinodular goiter.   She is s/p benign FNA of the isthmic nodule in May 2006 and in 2018 She is S/P benign FNA of right mid thyroid nodule 02/2015 and in 2019   Thyroid ultrasound on 03/02/2021 showed 6 year stability of multiple thyroid nodules and NO further ultrasound was recommended  She is S/P RAI ablation 31.5 mCi I-131 sodium iodide orally  03/19/2019  TFT's have been normal    SUBJECTIVE:   During the last visit (01/25/2023): A1c 6.1%   Today (08/16/2023): Ms. Funderburk  is here for a follow up on diabetes management. She checks her blood sugars 1 times daily. The patient has not had hypoglycemic episodes since the last clinic visit  She continues with  pulmonary PT for COPD  She continues to follow-up with oncology for history of estrogen receptor positive breast cancer  She is on oxygen 24 hrs a day   HOME DIABETES REGIMEN:  Janumet 50-1000 2 tabs daily  Farxiga 10 mg daily    Statin: yes ACE-I/ARB: no   METER DOWNLOAD SUMMARY: Did not bring        DIABETIC COMPLICATIONS: Microvascular complications:   Denies: CKD, retinopathy  Last Eye Exam: Completed  2022  Macrovascular complications:   Denies: CAD, CVA, PVD   HISTORY:  Past Medical History:  Past Medical History:  Diagnosis Date   Abdominal aneurysm (HCC)    Dr. Darrick Penna follows lLOV 2 ''17 per pt "around 2 cm"   Anemia    as a child   Arthritis    left ankle, right knee, right SI joint, wrists, lower back   Breast cancer in female [facility])    Left   COPD (chronic obstructive pulmonary disease) (HCC)    ephysema-Dr. 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 Gerrit Friends in OR [place]   Sleep apnea    cpap used sometimes, uses oxygen concentrator 2 l/m nasally bedtime   Varicella as child   Past Surgical History:  Past Surgical History:  Procedure Laterality Date   APPENDECTOMY     2008   BREAST LUMPECTOMY Left 08/31/2017   BREAST LUMPECTOMY WITH RADIOACTIVE SEED LOCALIZATION Left 08/10/2017  Procedure: BREAST LUMPECTOMY WITH RADIOACTIVE SEED LOCALIZATION;  Surgeon: Emelia Loron, MD;  Location: [facility] OR;  Service: General;  Laterality: Left;   CESAREAN SECTION     1972   COLONOSCOPY     COLONOSCOPY WITH PROPOFOL N/A 04/16/2016   Procedure: COLONOSCOPY WITH PROPOFOL;  Surgeon: Jeani Hawking, MD;  Location: [facility]COPY;  Service: Endoscopy;  Laterality: N/A;   CYST REMOVAL NECK Left 04/13/2016   Procedure: EXCISION OF SEBACEOUS CYST LEFT POSTERIOR NECK;  Surgeon: Darnell Level, MD;  Location: [facility] OR;  Service: General;  Laterality: Left;   Excision of Pelvic Absess, Right Ovary     2008   RE-EXCISION OF BREAST CANCER,SUPERIOR MARGINS Left 08/31/2017   Procedure: RE-EXCISION OF LEFT   BREAST MARGINS ERAS PATHWAY;  Surgeon: Emelia Loron, MD;  Location: [facility] OR;  Service: General;  Laterality: Left;   TUBAL LIGATION  1980   Social History:  reports that she quit smoking about 12 years ago. Her smoking use included cigarettes. She started smoking about 62 years ago. She has a 50 pack-year smoking history. She has never used smokeless tobacco. She reports current alcohol use. She reports that she does not use drugs. Family History:  Family History  Problem Relation Age of Onset   Heart disease Mother        MI - fatal   Hypertension Mother    Stroke Father 31       fatal   Alzheimer's disease Father    Alzheimer's disease Brother    Hyperlipidemia Brother    Hypertension Brother    Diabetes Brother    Hypertension Brother    Hyperlipidemia Brother    Breast cancer Paternal Grandmother      HOME MEDICATIONS: Allergies as of 08/16/2023       Reactions   Benicar [olmesartan] Swelling   Swelling of face and arms    [name] [valsartan] Swelling   Swelling of face and arms    Hydrocodone-acetaminophen Nausea And Vomiting   Severe vomiting   Lisinopril Cough   Monosodium Glutamate Other (See Comments)   Facial swelling per pt   Codeine Other (See Comments)   jittery   Lead Acetate Rash   Nickel Rash   Severe rash to infection: pt is allergic to all metals other than sterling silver or gold jewelry.         Medication List        Accurate as of August 16, 2023  7:17 AM. If you have any questions, ask your nurse or doctor.          Accu-Chek Aviva Plus w/Device Kit Use to monitor glucose levels once per day; E11.9   Accu-Chek Softclix Lancets lancets Use as instructed to check blood sugar 1x daily   acetaminophen 500 MG tablet Commonly known as: TYLENOL Take 1,000 mg by mouth every 6 (six) hours as needed.   amLODipine 10 MG tablet Commonly known as: NORVASC TAKE ONE TABLET BY MOUTH ONCE DAILY   aspirin EC 81 MG tablet Take 1 tablet (81 mg  total) by mouth daily.   carvedilol 6.25 MG tablet Commonly known as: COREG TAKE ONE TABLET BY MOUTH EVERY MORNING and TAKE ONE TABLET BY MOUTH EVERYDAY AT BEDTIME   dapagliflozin propanediol 10 MG Tabs tablet Commonly known as: Farxiga Take 1 tablet (10 mg total) by mouth daily.   glucose blood test strip USE 1 STRIP ONCE DAILY   Accu-Chek Guide test strip Generic drug: glucose blood USE 1 STRIP ONCE DAILY  Accu-Chek Aviva Plus test strip Generic drug: glucose blood USE TO Check blood glucose 1x DAILY   Janumet XR 50-1000 MG Tb24 Generic drug: SitaGLIPtin-MetFORMIN HCl Take 2 tablets by mouth daily before supper.   methocarbamol 500 MG tablet Commonly known as: ROBAXIN Take 1 tablet (500 mg total) by mouth every 8 (eight) hours as needed for muscle spasms.   OXYGEN Inhale 2-3 L into the lungs continuous. When exerting self   Potassium Chloride ER 20 MEQ Tbcr Take 1 tablet by mouth every morning.   potassium chloride SA 20 MEQ tablet Commonly known as: KLOR-CON M Take 1 tablet (20 mEq total) by mouth 2 (two) times daily.   ProAir RespiClick 108 (90 Base) MCG/ACT Aepb Generic drug: Albuterol Sulfate Inhale 1-2 puffs into the lungs every 6 (six) hours as needed (for wheezing/shortness of breath).   albuterol (2.5 MG/3ML) 0.083% nebulizer solution Commonly known as: PROVENTIL Take 3 mLs (2.5 mg total) by nebulization every 6 (six) hours as needed for wheezing or shortness of breath.   Prolia 60 MG/ML Sosy injection Generic drug: denosumab Inject 60 mg into the skin every 6 (six) months.   roflumilast 500 MCG Tabs tablet Commonly known as: DALIRESP TAKE ONE TABLET BY MOUTH EVERY MORNING   rosuvastatin 40 MG tablet Commonly known as: CRESTOR TAKE ONE TABLET BY MOUTH EVERYDAY AT BEDTIME   spironolactone 25 MG tablet Commonly known as: ALDACTONE TAKE ONE TABLET BY MOUTH ONCE DAILY   thiamine 100 MG tablet Commonly known as: VITAMIN B1 TAKE 1 TABLET BY MOUTH  EVERY OTHER DAY   Trelegy Ellipta 100-62.5-25 MCG/ACT Aepb Generic drug: Fluticasone-Umeclidin-Vilant INHALE ONE (1) PUFF BY MOUTH AND INTO THE LUNGS DAILY *REFILL REQUEST*   Vitamin D3 125 MCG (5000 UT) Caps TAKE ONE CAPSULE BY MOUTH ONCE DAILY         OBJECTIVE:   Vital Signs: There were no vitals taken for this visit.  Wt Readings from Last 3 Encounters:  02/15/23 194 lb 3.6 oz (88.1 kg)  02/01/23 195 lb 1.7 oz (88.5 kg)  01/25/23 192 lb (87.1 kg)     Exam: General: Pt appears well and is in NAD  Lungs: Clear with good BS bilat   Heart: RRR   Extremities: No pretibial edema.   Neuro: MS is good with appropriate affect, pt is alert and Ox3    DM foot exam: 01/25/2023  The skin of the feet is without sores or ulcerations, multiple callus formation noted b/l The pedal pulses are 2+ on right and 2+ on left. The sensation is intact to a screening 5.07, 10 gram monofilament bilaterally        DATA REVIEWED:  Lab Results  Component Value Date   HGBA1C 6.1 (A) 01/25/2023   HGBA1C 7.2 (A) 07/19/2022   HGBA1C 7.1 (A) 03/25/2022    Latest Reference Range & Units 06/15/22 15:11  Sodium 135 - 145 mEq/L 138  Potassium 3.5 - 5.1 mEq/L 4.1  Chloride 96 - 112 mEq/L 99  CO2 19 - 32 mEq/L 34 (H)  Glucose 70 - 99 mg/dL 161 (H)  BUN 6 - 23 mg/dL 24 (H)  Creatinine 0.96 - 1.20 mg/dL 0.45  Calcium 8.4 - 40.9 mg/dL 9.9  GFR >81.19 mL/min 58.93 (L)    In office BG 103 mg/dL   ASSESSMENT / PLAN / RECOMMENDATIONS:   1) Type 2 Diabetes Mellitus, Optimally controlled, Without complications - Most recent A1c of 6.1 %. Goal A1c < 7.5 %.    -A1c remains  at goal -She has been able to tolerate a full dose of Farxiga after gradually increasing the dose -No changes at this time, but if her A1c continues to be below 6.5% we may consider decreasing Janumet   MEDICATIONS: Continue Janumet 50-1000 mg, 2 tabs before supper Continue Farxiga 10 mg, 1 tab every morning  EDUCATION /  INSTRUCTIONS: BG monitoring instructions: Patient is instructed to check her blood sugars 1 times a day, fasting. Call [place] [facility] if: BG persistently < 70  I reviewed the Rule of 15 for the treatment of hypoglycemia in detail with the patient. Literature supplied.    2) Diabetic complications:  Eye: Does not have known diabetic retinopathy.  Neuro/ Feet: Does not have known diabetic peripheral neuropathy .  Renal: Patient does not have known baseline CKD. She   is not on an ACEI/ARB at present.     F/U in 6 months      Signed electronically by: Lyndle Herrlich, MD  [facility]  [facility] [address] [place], [place] [address] Phone: [phone] FAX: [phone]   CC: Cristino Martes, NP [address] Le[address] [place] [address] Phone: [phone]  Fax: [phone]  Return to Endocrinology clinic as below: Future Appointments  Date Time Provider Department Center  08/16/2023 11:10 AM Billiejean Schimek, Konrad Dolores, MD [facility] None  09/16/2023 11:30 AM Rachel Moulds, MD [facility] None

## 2023-09-10 LAB — LAB REPORT - SCANNED: EGFR: 94

## 2023-09-13 ENCOUNTER — Ambulatory Visit: Admitting: Internal Medicine

## 2023-09-13 NOTE — Progress Notes (Deleted)
 Name: Victoria [name]  Age/ Sex: 78 y.o., female   MRN/ DOB: [id number], 1945/06/06     PCP: Vevelyn Gowers, NP   Reason for Endocrinology Evaluation: Type 2 Diabetes Mellitus  Initial Endocrine Consultative Visit: 02/28/2014    PATIENT IDENTIFIER: [name] is a 78 y.o. female with a past medical history of OSA, COPD, HTN, Hx of breast Ca. The patient has followed with Endocrinology clinic since 02/28/2014 for consultative assistance with management of her diabetes.  DIABETIC HISTORY:  Victoria [name] was diagnosed with DM 2017. Her hemoglobin A1c has ranged from 6.3% in 2022, peaking at 10.3% in 2020.   PCP prescribed Farxiga  05/2022 but pt developed dizziness to 10 mg dose.    THYROID  HISTORY: The patient has been diagnosed with hyperthyroid in 2019.  Thyroid  ultrasound showed multinodular goiter.   She is s/p benign FNA of the isthmic nodule in May 2006 and in 2018 She is S/P benign FNA of right mid thyroid  nodule 02/2015 and in 2019   Thyroid  ultrasound on 03/02/2021 showed 6 year stability of multiple thyroid  nodules and NO further ultrasound was recommended  She is S/P RAI ablation 31.5 mCi I-131 sodium iodide orally  03/19/2019  TFT's have been normal    SUBJECTIVE:   During the last visit (01/25/2023): A1c 6.1%   Today (09/13/2023): Victoria [name]  is here for a follow up on diabetes management. She checks her blood sugars 1 times daily. The patient has not had hypoglycemic episodes since the last clinic visit  She continues with  pulmonary PT for COPD  She continues to follow-up with oncology for history of estrogen receptor positive breast cancer  She is on oxygen  24 hrs a day   HOME DIABETES REGIMEN:  Janumet  50-1000 2 tabs daily  Farxiga  10 mg daily    Statin: yes ACE-I/ARB: no   METER DOWNLOAD SUMMARY: Did not bring        DIABETIC COMPLICATIONS: Microvascular complications:   Denies: CKD, retinopathy  Last Eye Exam: Completed  2022  Macrovascular complications:   Denies: CAD, CVA, PVD   HISTORY:  Past Medical History:  Past Medical History:  Diagnosis Date   Abdominal aneurysm (HCC)    Dr. Nolene Baumgarten follows lLOV 2 ''17 per pt "around 2 cm"   Anemia    as a child   Arthritis    left ankle, right knee, right SI joint, wrists, lower back   Breast cancer in female [facility])    Left   COPD (chronic obstructive pulmonary disease) (HCC)    ephysema-Dr. 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 Sofia Dunn in [place]   Sleep apnea    cpap used sometimes, uses oxygen  concentrator 2 l/m nasally bedtime   Varicella as child   Past Surgical History:  Past Surgical History:  Procedure Laterality Date   APPENDECTOMY     2008   BREAST LUMPECTOMY Left 08/31/2017   BREAST LUMPECTOMY WITH RADIOACTIVE SEED LOCALIZATION Left 08/10/2017  Procedure: BREAST LUMPECTOMY WITH RADIOACTIVE SEED LOCALIZATION;  Surgeon: Enid Harry, MD;  Location: [facility] OR;  Service: General;  Laterality: Left;   CESAREAN SECTION     1972   COLONOSCOPY     COLONOSCOPY WITH PROPOFOL  N/A 04/16/2016   Procedure: COLONOSCOPY WITH PROPOFOL ;  Surgeon: Alvis Jourdain, MD;  Location: [facility];  Service: Endoscopy;  Laterality: N/A;   CYST REMOVAL NECK Left 04/13/2016   Procedure: EXCISION OF SEBACEOUS CYST LEFT POSTERIOR NECK;  Surgeon: Oralee Billow, MD;  Location: [facility] OR;  Service: General;  Laterality: Left;   Excision of Pelvic Absess, Right Ovary     2008   RE-EXCISION OF BREAST CANCER,SUPERIOR MARGINS Left 08/31/2017   Procedure: RE-EXCISION OF LEFT   BREAST MARGINS ERAS PATHWAY;  Surgeon: Enid Harry, MD;  Location: [facility] OR;  Service: General;  Laterality: Left;   TUBAL LIGATION  1980   Social History:  reports that she quit smoking about 12 years ago. Her smoking use included cigarettes. She started smoking about 62 years ago. She has a 50 pack-year smoking history. She has never used smokeless tobacco. She reports current alcohol use. She reports that she does not use drugs. Family History:  Family History  Problem Relation Age of Onset   Heart disease Mother        MI - fatal   Hypertension Mother    Stroke Father 2       fatal   Alzheimer's disease Father    Alzheimer's disease Brother    Hyperlipidemia Brother    Hypertension Brother    Diabetes Brother    Hypertension Brother    Hyperlipidemia Brother    Breast cancer Paternal Grandmother      HOME MEDICATIONS: Allergies as of 09/13/2023       Reactions   Benicar  [olmesartan ] Swelling   Swelling of face and arms    [name]  [valsartan ] Swelling   Swelling of face and arms    Hydrocodone -acetaminophen  Nausea And Vomiting   Severe vomiting   Lisinopril  Cough   Monosodium Glutamate Other (See Comments)   Facial swelling per pt   Codeine Other (See Comments)   jittery   Lead Acetate Rash   Nickel Rash   Severe rash to infection: pt is allergic to all metals other than sterling silver or gold jewelry.         Medication List        Accurate as of September 13, 2023  9:43 AM. If you have any questions, ask your nurse or doctor.          Accu-Chek Aviva Plus w/Device Kit Use to monitor glucose levels once per day; E11.9   Accu-Chek Softclix Lancets lancets Use as instructed to check blood sugar 1x daily   acetaminophen  500 MG tablet Commonly known as: TYLENOL  Take 1,000 mg by mouth every 6 (six) hours as needed.   amLODipine  10 MG tablet Commonly known as: NORVASC  TAKE ONE TABLET BY MOUTH ONCE DAILY   aspirin  EC 81 MG tablet Take 1 tablet (81 mg  total) by mouth daily.   carvedilol  6.25 MG tablet Commonly known as: COREG  TAKE ONE TABLET BY MOUTH EVERY MORNING and TAKE ONE TABLET BY MOUTH EVERYDAY AT BEDTIME   dapagliflozin  propanediol 10 MG Tabs tablet Commonly known as: Farxiga  Take 1 tablet (10 mg total) by mouth daily.   FeroSul 325 (65 Fe) MG tablet Generic drug: ferrous sulfate Take 325 mg by mouth 3 (three) times a week.   glucose blood test  strip USE 1 STRIP ONCE DAILY   Accu-Chek Guide test strip Generic drug: glucose blood USE 1 STRIP ONCE DAILY   Accu-Chek Aviva Plus test strip Generic drug: glucose blood USE TO Check blood glucose 1x DAILY   Janumet  XR 50-1000 MG Tb24 Generic drug: SitaGLIPtin-MetFORMIN HCl Take 2 tablets by mouth daily before supper.   methocarbamol  500 MG tablet Commonly known as: ROBAXIN  Take 1 tablet (500 mg total) by mouth every 8 (eight) hours as needed for muscle spasms.   OXYGEN  Inhale 2-3 L into the lungs continuous. When exerting self   Potassium Chloride  ER 20 MEQ Tbcr Take 1 tablet by mouth every morning.   potassium chloride  SA 20 MEQ tablet Commonly known as: KLOR-CON  M Take 1 tablet (20 mEq total) by mouth 2 (two) times daily.   ProAir  RespiClick 108 (90 Base) MCG/ACT Aepb Generic drug: Albuterol  Sulfate Inhale 1-2 puffs into the lungs every 6 (six) hours as needed (for wheezing/shortness of breath).   albuterol  (2.5 MG/3ML) 0.083% nebulizer solution Commonly known as: PROVENTIL  Take 3 mLs (2.5 mg total) by nebulization every 6 (six) hours as needed for wheezing or shortness of breath.   Prolia  60 MG/ML Sosy injection Generic drug: denosumab  Inject 60 mg into the skin every 6 (six) months.   roflumilast  500 MCG Tabs tablet Commonly known as: DALIRESP  TAKE ONE TABLET BY MOUTH EVERY MORNING   rosuvastatin  40 MG tablet Commonly known as: CRESTOR  TAKE ONE TABLET BY MOUTH EVERYDAY AT BEDTIME   spironolactone  25 MG tablet Commonly known as: ALDACTONE  TAKE  ONE TABLET BY MOUTH ONCE DAILY   thiamine  100 MG tablet Commonly known as: VITAMIN B1 TAKE 1 TABLET BY MOUTH EVERY OTHER DAY   Trelegy Ellipta  100-62.5-25 MCG/ACT Aepb Generic drug: Fluticasone -Umeclidin-Vilant INHALE ONE (1) PUFF BY MOUTH AND INTO THE LUNGS DAILY *REFILL REQUEST*   Vitamin D3 125 MCG (5000 UT) Caps TAKE ONE CAPSULE BY MOUTH ONCE DAILY         OBJECTIVE:   Vital Signs: There were no vitals taken for this visit.  Wt Readings from Last 3 Encounters:  02/15/23 194 lb 3.6 oz (88.1 kg)  02/01/23 195 lb 1.7 oz (88.5 kg)  01/25/23 192 lb (87.1 kg)     Exam: General: Pt appears well and is in NAD  Lungs: Clear with good BS bilat   Heart: RRR   Extremities: No pretibial edema.   Neuro: MS is good with appropriate affect, pt is alert and Ox3    DM foot exam: 01/25/2023  The skin of the feet is without sores or ulcerations, multiple callus formation noted b/l The pedal pulses are 2+ on right and 2+ on left. The sensation is intact to a screening 5.07, 10 gram monofilament bilaterally        DATA REVIEWED:  Lab Results  Component Value Date   HGBA1C 6.1 (A) 01/25/2023   HGBA1C 7.2 (A) 07/19/2022   HGBA1C 7.1 (A) 03/25/2022    Latest Reference Range & Units 06/15/22 15:11  Sodium 135 - 145 mEq/L 138  Potassium 3.5 - 5.1 mEq/L 4.1  Chloride 96 - 112 mEq/L 99  CO2 19 - 32 mEq/L 34 (H)  Glucose 70 - 99 mg/dL 161 (H)  BUN 6 - 23 mg/dL 24 (H)  Creatinine 0.96 - 1.20 mg/dL 0.45  Calcium  8.4 - 10.5 mg/dL 9.9  GFR >40.98 mL/min 58.93 (L)    In office BG 103 mg/dL   ASSESSMENT / PLAN / RECOMMENDATIONS:   1) Type 2  Diabetes Mellitus, Optimally controlled, Without complications - Most recent A1c of 6.1 %. Goal A1c < 7.5 %.    -A1c remains at goal -She has been able to tolerate a full dose of Farxiga  after gradually increasing the dose -No changes at this time, but if her A1c continues to be below 6.5% we may consider decreasing  Janumet    MEDICATIONS: Continue Janumet  50-1000 mg, 2 tabs before supper Continue Farxiga  10 mg, 1 tab every morning  EDUCATION / INSTRUCTIONS: BG monitoring instructions: Patient is instructed to check her blood sugars 1 times a day, fasting. Call [facility] if: BG persistently < 70  I reviewed the Rule of 15 for the treatment of hypoglycemia in detail with the patient. Literature supplied.    2) Diabetic complications:  Eye: Does not have known diabetic retinopathy.  Neuro/ Feet: Does not have known diabetic peripheral neuropathy .  Renal: Patient does not have known baseline CKD. She   is not on an ACEI/ARB at present.     F/U in 6 months      Signed electronically by: Natale Bail, MD  [facility] Endocrinology  [facility] [address] [place], [place] [address] Phone: [phone] FAX: [phone]   CC: Vevelyn Gowers, NP [address] [place] [address] Phone: [phone]  Fax: [phone]  Return to Endocrinology clinic as below: Future Appointments  Date Time Provider Department Center  09/13/2023  2:20 PM Emaad Nanna, Julian Obey, MD [facility] None  09/16/2023 11:30 AM Murleen Arms, MD [facility] None

## 2023-09-15 ENCOUNTER — Telehealth: Payer: Self-pay

## 2023-09-15 NOTE — Telephone Encounter (Signed)
 Left message for patient about appointment on 09/16/23

## 2023-09-16 ENCOUNTER — Inpatient Hospital Stay: Payer: 59 | Attending: Hematology and Oncology | Admitting: Hematology and Oncology

## 2023-09-16 VITALS — BP 149/60 | HR 79 | Temp 98.1°F | Resp 18 | Wt 186.0 lb

## 2023-09-16 DIAGNOSIS — Z170 Estrogen receptor positive status [ER+]: Secondary | ICD-10-CM | POA: Diagnosis not present

## 2023-09-16 DIAGNOSIS — C50412 Malignant neoplasm of upper-outer quadrant of left female breast: Secondary | ICD-10-CM | POA: Insufficient documentation

## 2023-09-16 DIAGNOSIS — Z87891 Personal history of nicotine dependence: Secondary | ICD-10-CM | POA: Diagnosis not present

## 2023-09-16 NOTE — Progress Notes (Signed)
 [name]  [phone] [phone]     ID: Victoria [name] DOB: 06-24-1945  MR#: [id number]  UXN#:[id number]  Patient Care Team: Victoria Gowers, NP as PCP - General (Nurse Practitioner) Victoria Ally, MD as PCP - Cardiology (Cardiology) Victoria Scot, MD as Consulting Physician (Pulmonary Disease) Victoria Abraham, MD as Consulting Physician (Obstetrics and Gynecology) Victoria [name], Victoria Cornfield, MD (Inactive) as Consulting Physician (Oncology) Victoria Harry, MD as Consulting Physician (General Surgery) Victoria Caster, MD as Consulting Physician (Radiation Oncology) Victoria Ally, MD as Consulting Physician (Cardiology) Victoria [name], Victoria Polio, NP as Nurse Practitioner (Hematology and Oncology) Victoria [name], Victoria Savoy, NP as Nurse Practitioner (Pulmonary Disease) Victoria Lerner, MD (Inactive) as Consulting Physician (Endocrinology) Victoria [name] Victoria [name], [facility] (Inactive) (Pharmacist) OTHER MD:  CHIEF COMPLAINT: Estrogen receptor positive breast cancer   CURRENT TREATMENT: Anastrozole    INTERVAL HISTORY:  Discussed the use of AI scribe software for clinical note transcription with the patient, who gave verbal consent to proceed.  History of Present Illness Victoria [name] is a 78 year old female with a history of breast cancer who presents for a follow-up visit.  She experiences occasional shortness of breath, which she describes as 'a little bit more than usual.' She previously attended pulmonary rehab classes in July last year, which she found beneficial, resulting in weight loss and improved stamina. However, her shortness of breath 'comes and goes.' She has difficulty with her current oxygen  concentrator due to its weight and is considering switching to a lighter [name], the Inogen, based on advice from a cousin who worked for [employer].  She has successfully lost weight, reporting a decrease from 197 pounds to 186 pounds. She attributes her  weight loss to a routine involving her blood pressure medication, which includes a diuretic, and a supplement called OPC3, an antioxidant from [employer]. She takes two capfuls of the supplement mixed with alkaline water daily. She emphasizes her preference for drinking water throughout the day, which she believes aids in her weight management.  She completed her breast cancer medication in 2024 and continues to undergo lung cancer screening, with the last screening conducted in August by her pulmonary specialist. She takes calcium , vitamin D , and iron supplements, the latter every other day due to a history of anemia. Her last mammogram in August was normal, and she has been cancer-free for six years.  During the review of symptoms, she denies any new breast lumps and reports a small amount of leg swelling. She continues to engage in some physical activity, including walking.    Wt Readings from Last 3 Encounters:  09/16/23 186 lb (84.4 kg)  02/15/23 194 lb 3.6 oz (88.1 kg)  02/01/23 195 lb 1.7 oz (88.5 kg)     Rest of the pertinent 10 point ROS reviewed and negative.   COVID 19 VACCINATION STATUS: Pfizer x2, most recently 06/2019, and status post 1 booster    HISTORY OF CURRENT ILLNESS: From the original intake note:  Victoria [name] had routine screening mammography on 06/16/2017 showing a possible abnormality in the left breast. She underwent unilateral left diagnostic mammography with tomography and left breast ultrasonography [facility] on 06/22/2017 showing: breast density category B. Suspicious left breast mass in the 2:30 position upper outer quadrant measuring 0.6 x 0.7 x 0.6 cm located 2 cm from the nipple. Palpable thickening with distortion. There was no sonographic evidence of lest axillary lymphadenopathy.  Accordingly on 06/24/2017 she proceeded to biopsy of the left  breast mass in question. The pathology from this procedure showed ([id number]):  Invasive  mammary carcinoma, grade 1. E-cadherin is positive supporting Ductal carcinoma diagnosis. Prognostic indicators significant for: estrogen receptor, 100% positive and progesterone receptor, 100% positive, both with strong staining intensity. Proliferation marker Ki67 at 5%. HER2 not amplified with ratios of HER2/CEP17 signals 1.21 and average copies per cell 2.00.  The patient's subsequent history is as detailed below.   PAST MEDICAL HISTORY: Past Medical History:  Diagnosis Date   Abdominal aneurysm (HCC)    Dr. Nolene [name] follows lLOV 2 ''17 per pt "around 2 cm"   Anemia    as a child   Arthritis    left ankle, right knee, right SI joint, wrists, lower back   Breast cancer in female Victoria [name])    Left   COPD (chronic obstructive pulmonary disease) (HCC)    ephysema-Victoria [name]   Dyspnea    Headache    as a child would have terrible headaches during season changes   Heart murmur    congenital, 2 D echo '10   Hyperlipidemia    Hypertension    Multiple thyroid  nodules    Murmur, cardiac 1950   Osteoporosis    Pneumonia    Pre-diabetes    Requires continuous at home supplemental oxygen     2 L 24/7   Sebaceous cyst    hairline sebaceous cyst left posterior neck to be excised 04-13-16 by Dr. Sofia [name] in [place]   Sleep apnea    cpap used sometimes, uses oxygen  concentrator 2 l/m nasally bedtime   Varicella as child    PAST SURGICAL HISTORY: Past Surgical History:  Procedure Laterality Date   APPENDECTOMY     2008   BREAST LUMPECTOMY Left 08/31/2017   BREAST LUMPECTOMY WITH RADIOACTIVE SEED LOCALIZATION Left 08/10/2017   Procedure: BREAST LUMPECTOMY WITH RADIOACTIVE SEED LOCALIZATION;  Surgeon: Victoria Harry, MD;  Location: [facility] OR;  Service: General;  Laterality: Left;   CESAREAN SECTION     1972   COLONOSCOPY     COLONOSCOPY WITH PROPOFOL  N/A 04/16/2016   Procedure: COLONOSCOPY WITH PROPOFOL ;  Surgeon: Victoria Jourdain, MD;  Location: [facility];  Service: Endoscopy;   Laterality: N/A;   CYST REMOVAL NECK Left 04/13/2016   Procedure: EXCISION OF SEBACEOUS CYST LEFT POSTERIOR NECK;  Surgeon: Oralee Billow, MD;  Location: [facility] OR;  Service: General;  Laterality: Left;   Excision of Pelvic Absess, Right Ovary     2008   RE-EXCISION OF BREAST CANCER,SUPERIOR MARGINS Left 08/31/2017   Procedure: RE-EXCISION OF LEFT  BREAST MARGINS ERAS PATHWAY;  Surgeon: Victoria Harry, MD;  Location: [name];  Service: General;  Laterality: Left;   TUBAL LIGATION  1980    FAMILY HISTORY Family History  Problem Relation Age of Onset   Heart disease Mother        MI - fatal   Hypertension Mother    Stroke Father 69       fatal   Alzheimer's disease Father    Alzheimer's disease Brother    Hyperlipidemia Brother    Hypertension Brother    Diabetes Brother    Hypertension Brother    Hyperlipidemia Brother    Breast cancer Paternal Grandmother   The patient's father died at age 23 due to a stroke and prostate cancer.  The patient's mother died at age 67 due to a heart attack and blood clot. The patient has 3 brothers and no sisters. She notes a paternal grandmother who was diagnosed  with breast cancer at age 22. She notes a paternal cousin with lung cancer. She denies a family history of ovarian cancer.   GYNECOLOGIC HISTORY:  No LMP recorded. Patient is postmenopausal. Menarche: 78 years old Age at first live birth: 78 years old GX P1 LMP: age 20 The patient had a right ovarian cyst which required removal. She notes that she still has her left ovary. She was on HRT for a short amount of time, but discontinued due to negative side affects. She was placed on Estroven as well.    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 She helps individuals with [employer] and program assisting on the computer. She is divorced. The patient's son, Oweda Danoff, is disabled and  works part-time at [employer] in [place]. The patient has 3 grandchildren and 3 great-grandchildren. She attends [employer].      ADVANCED DIRECTIVES:  Son, Mozell Arias and daughter in law, Abe Abed   HEALTH MAINTENANCE: Social History   Tobacco Use   Smoking status: Former    Current packs/day: 0.00    Average packs/day: 1 pack/day for 50.0 years (50.0 ttl pk-yrs)    Types: Cigarettes    Start date: 04/15/1961    Quit date: 04/16/2011    Years since quitting: 12.4   Smokeless tobacco: Never   Tobacco comments:    quit that date when she had to go to ER   Vaping Use   Vaping status: Never Used  Substance Use Topics   Alcohol use: Yes    Alcohol/week: 0.0 standard drinks of alcohol    Comment: rare occasion   Drug use: No    Comment: no marijuana since 2012     Colonoscopy: December 2017/ Dr. Nickey Barn  PAP: September 2016  Bone density: 12/08/2016 showed a T score of -2.4   Allergies  Allergen Reactions   Benicar  [Olmesartan ] Swelling    Swelling of face and arms    [name]  [Valsartan ] Swelling    Swelling of face and arms    Hydrocodone -Acetaminophen  Nausea And Vomiting    Severe vomiting   Lisinopril  Cough   Monosodium Glutamate Other (See Comments)    Facial swelling per pt   Codeine Other (See Comments)    jittery   Lead Acetate Rash   Nickel Rash    Severe rash to infection: pt is allergic to all metals other than sterling silver or gold jewelry.     Current Outpatient Medications  Medication Sig Dispense Refill   Accu-Chek Softclix Lancets lancets Use as instructed to check blood sugar 1x daily 100 each 11   acetaminophen  (TYLENOL ) 500 MG tablet Take 1,000 mg by mouth every 6 (six) hours as needed.     albuterol  (PROVENTIL ) (2.5 MG/3ML) 0.083% nebulizer solution Take 3 mLs (2.5 mg total) by nebulization every 6 (six) hours as needed for wheezing or shortness of breath. 75 mL 3   Albuterol  Sulfate (PROAIR  RESPICLICK) 108 (90 Base) MCG/ACT AEPB  Inhale 1-2 puffs into the lungs every 6 (six) hours as needed (for wheezing/shortness of breath). 1 each 5   amLODipine  (NORVASC ) 10 MG tablet TAKE ONE TABLET BY MOUTH ONCE DAILY 30 tablet 0   aspirin  EC 81 MG tablet Take 1 tablet (81 mg total) by mouth daily. 90 tablet 3   Blood Glucose Monitoring Suppl (ACCU-CHEK AVIVA PLUS) w/Device KIT Use to monitor glucose levels once per day; E11.9 1 kit  0   carvedilol  (COREG ) 6.25 MG tablet TAKE ONE TABLET BY MOUTH EVERY MORNING and TAKE ONE TABLET BY MOUTH EVERYDAY AT BEDTIME 60 tablet 0   Cholecalciferol (VITAMIN D3) 125 MCG (5000 UT) CAPS TAKE ONE CAPSULE BY MOUTH ONCE DAILY 90 capsule 0   dapagliflozin  propanediol (FARXIGA ) 10 MG TABS tablet Take 1 tablet (10 mg total) by mouth daily. 90 tablet 3   denosumab  (PROLIA ) 60 MG/ML SOSY injection Inject 60 mg into the skin every 6 (six) months. 1 mL 1   FEROSUL 325 (65 Fe) MG tablet Take 325 mg by mouth 3 (three) times a week.     Fluticasone -Umeclidin-Vilant (TRELEGY ELLIPTA ) 100-62.5-25 MCG/ACT AEPB INHALE ONE (1) PUFF BY MOUTH AND INTO THE LUNGS DAILY *REFILL REQUEST* 60 each 10   glucose blood (ACCU-CHEK AVIVA PLUS) test strip USE TO Check blood glucose 1x DAILY 100 strip 5   glucose blood (ACCU-CHEK GUIDE) test strip USE 1 STRIP ONCE DAILY 100 each 0   glucose blood test strip USE 1 STRIP ONCE DAILY 100 each 12   methocarbamol  (ROBAXIN ) 500 MG tablet Take 1 tablet (500 mg total) by mouth every 8 (eight) hours as needed for muscle spasms. 30 tablet 0   OXYGEN  Inhale 2-3 L into the lungs continuous. When exerting self     Potassium Chloride  ER 20 MEQ TBCR Take 1 tablet by mouth every morning.     potassium chloride  SA (KLOR-CON ) 20 MEQ tablet Take 1 tablet (20 mEq total) by mouth 2 (two) times daily. 180 tablet 0   roflumilast  (DALIRESP ) 500 MCG TABS tablet TAKE ONE TABLET BY MOUTH EVERY MORNING 90 tablet 3   rosuvastatin  (CRESTOR ) 40 MG tablet TAKE ONE TABLET BY MOUTH EVERYDAY AT BEDTIME 30 tablet 0    SitaGLIPtin-MetFORMIN HCl (JANUMET  XR) 50-1000 MG TB24 Take 2 tablets by mouth daily before supper. 180 tablet 3   spironolactone  (ALDACTONE ) 25 MG tablet TAKE ONE TABLET BY MOUTH ONCE DAILY 30 tablet 0   thiamine  (VITAMIN B1) 100 MG tablet TAKE 1 TABLET BY MOUTH EVERY OTHER DAY 15 tablet 10   thiamine  (VITAMIN B1) 100 MG tablet Take 100 mg by mouth every other day.     No current facility-administered medications for this visit.    OBJECTIVE: African-American woman examined in a wheelchair  Vitals:   09/16/23 1212  BP: (!) 149/60  Pulse: 79  Resp: 18  Temp: 98.1 F (36.7 C)  SpO2: 95%     Body mass index is 31.43 kg/m.   Wt Readings from Last 3 Encounters:  09/16/23 186 lb (84.4 kg)  02/15/23 194 lb 3.6 oz (88.1 kg)  02/01/23 195 lb 1.7 oz (88.5 kg)  ECOG FS:2 - Symptomatic, <50% confined to bed  Physical Exam Constitutional:      Appearance: Normal appearance.  Cardiovascular:     Rate and Rhythm: Normal rate and regular rhythm.  Chest:     Comments: Bilateral breasts inspected in sitting position.  No palpable masses or regional adenopathy.  Musculoskeletal:        General: Swelling (Mild LE edema bilaterally) present. No tenderness.     Cervical back: Normal range of motion.  Skin:    General: Skin is warm and dry.  Neurological:     General: No focal deficit present.     Mental Status: She is alert.       LAB RESULTS:  CMP     Component Value Date/Time   NA 138 06/15/2022 1511   NA  139 01/08/2019 1025   K 4.1 06/15/2022 1511   CL 99 06/15/2022 1511   CO2 34 (H) 06/15/2022 1511   GLUCOSE 144 (H) 06/15/2022 1511   BUN 24 (H) 06/15/2022 1511   BUN 15 01/08/2019 1025   CREATININE 0.93 06/15/2022 1511   CREATININE 0.84 11/05/2021 1123   CREATININE 0.74 12/24/2019 1420   CALCIUM  9.9 06/15/2022 1511   PROT 6.9 11/05/2021 1123   PROT 6.6 01/08/2019 1025   ALBUMIN 3.8 11/05/2021 1123   ALBUMIN 4.1 01/08/2019 1025   AST 39 11/05/2021 1123   ALT 28  11/05/2021 1123   ALKPHOS 91 11/05/2021 1123   BILITOT 0.4 11/05/2021 1123   GFRNONAA >60 11/05/2021 1123   GFRNONAA 79 12/24/2019 1420   GFRAA 92 12/24/2019 1420    Lab Results  Component Value Date   TOTALPROTELP 6.7 02/28/2014   ALBUMINELP 57.4 02/28/2014   A1GS 4.9 02/28/2014   A2GS 8.5 02/28/2014   BETS 7.5 (H) 02/28/2014   BETA2SER 5.3 02/28/2014   GAMS 16.4 02/28/2014   MSPIKE 0.31 02/28/2014   SPEI SEE NOTE 02/28/2014    No results found for: "KPAFRELGTCHN", "LAMBDASER", "KAPLAMBRATIO"  Lab Results  Component Value Date   WBC 9.1 06/15/2022   NEUTROABS 6.4 06/15/2022   HGB 11.7 (L) 06/15/2022   HCT 35.5 (L) 06/15/2022   MCV 85.6 06/15/2022   PLT 218.0 06/15/2022   No results found for: "LABCA2"  No components found for: "OZHYQM578"  No results for input(s): "INR" in the last 168 hours.  No results found for: "LABCA2"  No results found for: "ION629"  No results found for: "CAN125"  No results found for: "CAN153"  No results found for: "CA2729"  No components found for: "HGQUANT"  No results found for: "CEA1", "CEA" / No results found for: "CEA1", "CEA"   No results found for: "AFPTUMOR"  No results found for: "CHROMOGRNA"  No results found for: "HGBA", "HGBA2QUANT", "HGBFQUANT", "HGBSQUAN" (Hemoglobinopathy evaluation)   No results found for: "LDH"  Lab Results  Component Value Date   IRON 59 04/19/2019   IRONPCTSAT 16.4 (L) 04/19/2019   (Iron and TIBC)  Lab Results  Component Value Date   FERRITIN 84.2 04/19/2019    Urinalysis    Component Value Date/Time   COLORURINE YELLOW 06/15/2022 1511   APPEARANCEUR Cloudy (A) 06/15/2022 1511   LABSPEC 1.025 06/15/2022 1511   PHURINE 6.0 06/15/2022 1511   GLUCOSEU NEGATIVE 06/15/2022 1511   HGBUR TRACE-INTACT (A) 06/15/2022 1511   BILIRUBINUR NEGATIVE 06/15/2022 1511   KETONESUR NEGATIVE 06/15/2022 1511   PROTEINUR NEGATIVE 01/25/2021 1228   UROBILINOGEN 0.2 06/15/2022 1511   NITRITE  NEGATIVE 06/15/2022 1511   LEUKOCYTESUR NEGATIVE 06/15/2022 1511    STUDIES: No results found.   ELIGIBLE FOR AVAILABLE RESEARCH PROTOCOL: no  ASSESSMENT: 78 y.o. [place] woman status post left breast upper outer quadrant biopsy 06/24/2017 for a clinical T1b N0, stage IA invasive ductal carcinoma, grade 1, estrogen and progesterone receptor positive, HER-2 not amplified, with an MIB-105%  (1) status post left lumpectomy without sentinel lymph node sampling on 08/10/2017 showed invasive ductal carcinoma grade II, spanning 1.2 cm. High grade DCIS. Anterior margin was was focally positive for invasive ductal carcinoma. All other margins clear.   (a) additional surgery for margin clearance 08/31/2017 showed atypical ductal hyperplasia, negative margins  (2) given the patient's age, the tumor size, grade and prognostic profile, will forego Oncotype and avoid chemotherapy  (3) may forgo adjuvant radiation if comfortable with anti-estrogen therapy  (  4) anastrozole  started 08/30/2017  (5) osteoporosis: bone density on 02/27/2014 showed a T score of -2.9   (a) bone density on 12/08/2016 showed a T score of -2.4 (osteopenia).  (b) bone density 04/10/2019 shows a T score of -2.2  (6) lung cancer screening: Initiated June 2019  (a) most recent scan 07/15/2020 benign  (b)  incidentally noted adrenal adenomas stable   PLAN:  Assessment & Plan Breast cancer Completed treatment in 2024. No new breast lumps. Recent mammogram normal. Six years since initial diagnosis. - Order mammogram for August at the breast center  Anemia Reports taking iron every other day. No current hemoglobin levels or symptoms provided. Continue to FU with PCP.   Shortness of breath Intermittent shortness of breath, slightly increased. Previously attended pulmonary rehab with good results. Uses heavy oxygen  concentrator, considering lighter Inogen [name] for mobility. - Consider switching to a lighter Inogen oxygen   concentrator.  Leg swelling Mild leg swelling observed.  Encourage elevating legs when sitting.  Hypertension Takes antihypertensive medication with a diuretic.   Total encounter time 30 minutes.*  *Total Encounter Time as defined by the Centers for Medicare and Medicaid Services includes, in addition to the face-to-face time of a patient visit (documented in the note above) non-face-to-face time: obtaining and reviewing outside history, ordering and reviewing medications, tests or procedures, care coordination (communications with other health care professionals or caregivers) and documentation in the medical record.

## 2023-09-20 ENCOUNTER — Ambulatory Visit: Admitting: Internal Medicine

## 2023-09-20 ENCOUNTER — Encounter: Payer: Self-pay | Admitting: Internal Medicine

## 2023-09-20 ENCOUNTER — Ambulatory Visit (INDEPENDENT_AMBULATORY_CARE_PROVIDER_SITE_OTHER): Admitting: Internal Medicine

## 2023-09-20 ENCOUNTER — Other Ambulatory Visit (HOSPITAL_COMMUNITY): Payer: Self-pay

## 2023-09-20 VITALS — BP 134/76 | HR 96 | Ht 64.5 in | Wt 186.0 lb

## 2023-09-20 DIAGNOSIS — E118 Type 2 diabetes mellitus with unspecified complications: Secondary | ICD-10-CM

## 2023-09-20 DIAGNOSIS — E119 Type 2 diabetes mellitus without complications: Secondary | ICD-10-CM

## 2023-09-20 DIAGNOSIS — Z7984 Long term (current) use of oral hypoglycemic drugs: Secondary | ICD-10-CM

## 2023-09-20 LAB — POCT GLUCOSE (DEVICE FOR HOME USE): POC Glucose: 130 mg/dL — AB (ref 70–99)

## 2023-09-20 MED ORDER — JANUMET XR 50-1000 MG PO TB24
1.0000 | ORAL_TABLET | Freq: Every day | ORAL | 3 refills | Status: DC
Start: 2023-09-20 — End: 2024-03-12
  Filled 2023-09-20: qty 90, 90d supply, fill #0
  Filled 2023-09-30: qty 30, 30d supply, fill #0

## 2023-09-20 MED ORDER — DAPAGLIFLOZIN PROPANEDIOL 10 MG PO TABS
10.0000 mg | ORAL_TABLET | Freq: Every day | ORAL | 3 refills | Status: DC
Start: 2023-09-20 — End: 2024-03-12
  Filled 2023-09-20: qty 90, 90d supply, fill #0
  Filled 2023-09-30: qty 30, 30d supply, fill #0

## 2023-09-20 NOTE — Patient Instructions (Addendum)
 Take Janumet  1 tablet before Supper  Take Farxiga  10 mg, 1 tablet daily    HOW TO TREAT LOW BLOOD SUGARS (Blood sugar LESS THAN 70 MG/DL) Please follow the RULE OF 15 for the treatment of hypoglycemia treatment (when your (blood sugars are less than 70 mg/dL)   STEP 1: Take 15 grams of carbohydrates when your blood sugar is low, which includes:  3-4 GLUCOSE TABS  OR 3-4 OZ OF JUICE OR REGULAR SODA OR ONE TUBE OF GLUCOSE GEL    STEP 2: RECHECK blood sugar in 15 MINUTES STEP 3: If your blood sugar is still low at the 15 minute recheck --> then, go back to STEP 1 and treat AGAIN with another 15 grams of carbohydrates.

## 2023-09-20 NOTE — Progress Notes (Deleted)
 Name: Victoria [name]  Age/ Sex: 78 y.o., female   MRN/ DOB: [id number], 13-Oct-1945     PCP: Victoria Gowers, NP   Reason for Endocrinology Evaluation: Type 2 Diabetes Mellitus  Initial Endocrine Consultative Visit: 02/28/2014    PATIENT IDENTIFIER: [name] is a 78 y.o. female with a past medical history of OSA, COPD, HTN, Hx of breast Ca. The patient has followed with Endocrinology clinic since 02/28/2014 for consultative assistance with management of her diabetes.  DIABETIC HISTORY:  [name] was diagnosed with DM 2017. Her hemoglobin A1c has ranged from 6.3% in 2022, peaking at 10.3% in 2020.   PCP prescribed Farxiga  05/2022 but pt developed dizziness to 10 mg dose.    THYROID  HISTORY: The patient has been diagnosed with hyperthyroid in 2019.  Thyroid  ultrasound showed multinodular goiter.   She is s/p benign FNA of the isthmic nodule in May 2006 and in 2018 She is S/P benign FNA of right mid thyroid  nodule 02/2015 and in 2019   Thyroid  ultrasound on 03/02/2021 showed 6 year stability of multiple thyroid  nodules and NO further ultrasound was recommended  She is S/P RAI ablation 31.5 mCi I-131 sodium iodide orally  03/19/2019  TFT's have been normal    SUBJECTIVE:   During the last visit (01/25/2023): A1c 6.1%   Today (09/20/2023): [name]  is here for a follow up on diabetes management. She checks her blood sugars 1 times daily. The patient has not had hypoglycemic episodes since the last clinic visit  She continues with  pulmonary PT for COPD  She continues to follow-up with oncology for history of estrogen receptor positive breast cancer.  She is s/p left lumpectomy, on anastrozole   She is on oxygen  24 hrs a day   HOME DIABETES REGIMEN:  Janumet  50-1000 2 tabs daily  Farxiga  10 mg daily    Statin: yes ACE-I/ARB: no   METER DOWNLOAD SUMMARY: Did not bring        DIABETIC COMPLICATIONS: Microvascular complications:   Denies: CKD,  retinopathy  Last Eye Exam: Completed 2022  Macrovascular complications:   Denies: CAD, CVA, PVD   HISTORY:  Past Medical History:  Past Medical History:  Diagnosis Date   Abdominal aneurysm (HCC)    Dr. Nolene [name] follows lLOV 2 ''17 per pt "around 2 cm"   Anemia    as a child   Arthritis    left ankle, right knee, right SI joint, wrists, lower back   Breast cancer in female [facility])    Left   COPD (chronic obstructive pulmonary disease) (HCC)    ephysema-Dr. 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 Sofia [name] in OR [place]   Sleep apnea    cpap used sometimes, uses oxygen  concentrator 2 l/m nasally bedtime   Varicella as child   Past Surgical History:  Past Surgical History:  Procedure Laterality Date   APPENDECTOMY     2008   BREAST LUMPECTOMY Left 08/31/2017  BREAST LUMPECTOMY WITH RADIOACTIVE SEED LOCALIZATION Left 08/10/2017   Procedure: BREAST LUMPECTOMY WITH RADIOACTIVE SEED LOCALIZATION;  Surgeon: Victoria Harry, MD;  Location: [facility] OR;  Service: General;  Laterality: Left;   CESAREAN SECTION     1972   COLONOSCOPY     COLONOSCOPY WITH PROPOFOL  N/A 04/16/2016   Procedure: COLONOSCOPY WITH PROPOFOL ;  Surgeon: Victoria Jourdain, MD;  Location: [facility];  Service: Endoscopy;  Laterality: N/A;   CYST REMOVAL NECK Left 04/13/2016   Procedure: EXCISION OF SEBACEOUS CYST LEFT POSTERIOR NECK;  Surgeon: Victoria Billow, MD;  Location: [facility] OR;  Service: General;  Laterality: Left;   Excision of Pelvic Absess, Right Ovary     2008   RE-EXCISION OF BREAST CANCER,SUPERIOR MARGINS Left  08/31/2017   Procedure: RE-EXCISION OF LEFT  BREAST MARGINS ERAS PATHWAY;  Surgeon: Victoria Harry, MD;  Location: [facility] OR;  Service: General;  Laterality: Left;   TUBAL LIGATION  1980   Social History:  reports that she quit smoking about 12 years ago. Her smoking use included cigarettes. She started smoking about 62 years ago. She has a 50 pack-year smoking history. She has never used smokeless tobacco. She reports current alcohol use. She reports that she does not use drugs. Family History:  Family History  Problem Relation Age of Onset   Heart disease Mother        MI - fatal   Hypertension Mother    Stroke Father 90       fatal   Alzheimer's disease Father    Alzheimer's disease Brother    Hyperlipidemia Brother    Hypertension Brother    Diabetes Brother    Hypertension Brother    Hyperlipidemia Brother    Breast cancer Paternal Grandmother      HOME MEDICATIONS: Allergies as of 09/20/2023       Reactions   Benicar  [olmesartan ] Swelling   Swelling of face and arms    [name]  [valsartan ] Swelling   Swelling of face and arms    Hydrocodone -acetaminophen  Nausea And Vomiting   Severe vomiting   Lisinopril  Cough   Monosodium Glutamate Other (See Comments)   Facial swelling per pt   Codeine Other (See Comments)   jittery   Lead Acetate Rash   Nickel Rash   Severe rash to infection: pt is allergic to all metals other than sterling silver or gold jewelry.         Medication List        Accurate as of Sep 20, 2023  7:01 AM. If you have any questions, ask your nurse or doctor.          Accu-Chek Aviva Plus w/Device Kit Use to monitor glucose levels once per day; E11.9   Accu-Chek Softclix Lancets lancets Use as instructed to check blood sugar 1x daily   acetaminophen  500 MG tablet Commonly known as: TYLENOL  Take 1,000 mg by mouth every 6 (six) hours as needed.   amLODipine  10 MG tablet Commonly known as: NORVASC  TAKE ONE TABLET BY MOUTH ONCE DAILY    aspirin  EC 81 MG tablet Take 1 tablet (81 mg total) by mouth daily.   carvedilol  6.25 MG tablet Commonly known as: COREG  TAKE ONE TABLET BY MOUTH EVERY MORNING and TAKE ONE TABLET BY MOUTH EVERYDAY AT BEDTIME   dapagliflozin  propanediol 10 MG Tabs tablet Commonly known as: Farxiga  Take 1 tablet (10 mg total) by mouth daily.   FeroSul 325 (65 Fe) MG tablet Generic drug: ferrous sulfate Take 325 mg by mouth  3 (three) times a week.   glucose blood test strip USE 1 STRIP ONCE DAILY   Accu-Chek Guide test strip Generic drug: glucose blood USE 1 STRIP ONCE DAILY   Accu-Chek Aviva Plus test strip Generic drug: glucose blood USE TO Check blood glucose 1x DAILY   Janumet  XR 50-1000 MG Tb24 Generic drug: SitaGLIPtin-MetFORMIN HCl Take 2 tablets by mouth daily before supper.   methocarbamol  500 MG tablet Commonly known as: ROBAXIN  Take 1 tablet (500 mg total) by mouth every 8 (eight) hours as needed for muscle spasms.   OXYGEN  Inhale 2-3 L into the lungs continuous. When exerting self   Potassium Chloride  ER 20 MEQ Tbcr Take 1 tablet by mouth every morning.   potassium chloride  SA 20 MEQ tablet Commonly known as: KLOR-CON  M Take 1 tablet (20 mEq total) by mouth 2 (two) times daily.   ProAir  RespiClick 108 (90 Base) MCG/ACT Aepb Generic drug: Albuterol  Sulfate Inhale 1-2 puffs into the lungs every 6 (six) hours as needed (for wheezing/shortness of breath).   albuterol  (2.5 MG/3ML) 0.083% nebulizer solution Commonly known as: PROVENTIL  Take 3 mLs (2.5 mg total) by nebulization every 6 (six) hours as needed for wheezing or shortness of breath.   Prolia  60 MG/ML Sosy injection Generic drug: denosumab  Inject 60 mg into the skin every 6 (six) months.   roflumilast  500 MCG Tabs tablet Commonly known as: DALIRESP  TAKE ONE TABLET BY MOUTH EVERY MORNING   rosuvastatin  40 MG tablet Commonly known as: CRESTOR  TAKE ONE TABLET BY MOUTH EVERYDAY AT BEDTIME   spironolactone  25  MG tablet Commonly known as: ALDACTONE  TAKE ONE TABLET BY MOUTH ONCE DAILY   thiamine  100 MG tablet Commonly known as: VITAMIN B1 TAKE 1 TABLET BY MOUTH EVERY OTHER DAY   thiamine  100 MG tablet Commonly known as: VITAMIN B1 Take 100 mg by mouth every other day.   Trelegy Ellipta  100-62.5-25 MCG/ACT Aepb Generic drug: Fluticasone -Umeclidin-Vilant INHALE ONE (1) PUFF BY MOUTH AND INTO THE LUNGS DAILY *REFILL REQUEST*   Vitamin D3 125 MCG (5000 UT) Caps TAKE ONE CAPSULE BY MOUTH ONCE DAILY         OBJECTIVE:   Vital Signs: There were no vitals taken for this visit.  Wt Readings from Last 3 Encounters:  09/16/23 186 lb (84.4 kg)  02/15/23 194 lb 3.6 oz (88.1 kg)  02/01/23 195 lb 1.7 oz (88.5 kg)     Exam: General: Pt appears well and is in NAD  Lungs: Clear with good BS bilat   Heart: RRR   Extremities: No pretibial edema.   Neuro: MS is good with appropriate affect, pt is alert and Ox3    DM foot exam: 01/25/2023  The skin of the feet is without sores or ulcerations, multiple callus formation noted b/l The pedal pulses are 2+ on right and 2+ on left. The sensation is intact to a screening 5.07, 10 gram monofilament bilaterally        DATA REVIEWED:  Lab Results  Component Value Date   HGBA1C 6.1 (A) 01/25/2023   HGBA1C 7.2 (A) 07/19/2022   HGBA1C 7.1 (A) 03/25/2022    Latest Reference Range & Units 06/15/22 15:11  Sodium 135 - 145 mEq/L 138  Potassium 3.5 - 5.1 mEq/L 4.1  Chloride 96 - 112 mEq/L 99  CO2 19 - 32 mEq/L 34 (H)  Glucose 70 - 99 mg/dL 161 (H)  BUN 6 - 23 mg/dL 24 (H)  Creatinine 0.96 - 1.20 mg/dL 0.45  Calcium  8.4 -  10.5 mg/dL 9.9  GFR >16.10 mL/min 58.93 (L)    In office BG 103 mg/dL   ASSESSMENT / PLAN / RECOMMENDATIONS:   1) Type 2 Diabetes Mellitus, Optimally controlled, Without complications - Most recent A1c of 6.1 %. Goal A1c < 7.5 %.    -A1c remains at goal -She has been able to tolerate a full dose of Farxiga  after  gradually increasing the dose -No changes at this time, but if her A1c continues to be below 6.5% we may consider decreasing Janumet    MEDICATIONS: Continue Janumet  50-1000 mg, 2 tabs before supper Continue Farxiga  10 mg, 1 tab every morning  EDUCATION / INSTRUCTIONS: BG monitoring instructions: Patient is instructed to check her blood sugars 1 times a day, fasting. Call [place] [facility] clinic if: BG persistently < 70  I reviewed the Rule of 15 for the treatment of hypoglycemia in detail with the patient. Literature supplied.    2) Diabetic complications:  Eye: Does not have known diabetic retinopathy.  Neuro/ Feet: Does not have known diabetic peripheral neuropathy .  Renal: Patient does not have known baseline CKD. She   is not on an ACEI/ARB at present.     F/U in 6 months      Signed electronically by: Natale Bail, MD  [facility]ology  [facility] [address] [place], [place] [address] Phone: [phone] FAX: [phone]   CC: Victoria Gowers, NP [address] [place] [address] Phone: [phone]  Fax: [phone]  Return to Endocrinology clinic as below: Future Appointments  Date Time Provider Department Center  09/20/2023  8:50 AM Padraig Nhan, Julian Obey, MD [facility] None  09/18/2024 11:30 AM Murleen Arms, MD [facility] None

## 2023-09-20 NOTE — Progress Notes (Unsigned)
 Name: Victoria [name]  Age/ Sex: 78 y.o., female   MRN/ DOB: [id number], Oct 29, 1945     PCP: Vevelyn Gowers, NP   Reason for Endocrinology Evaluation: Type 2 Diabetes Mellitus  Initial Endocrine Consultative Visit: 02/28/2014    PATIENT IDENTIFIER: Victoria [name] is a 78 y.o. female with a past medical history of OSA, COPD, HTN, Hx of breast Ca. The patient has followed with Endocrinology clinic since 02/28/2014 for consultative assistance with management of her diabetes.  DIABETIC HISTORY:  Victoria [name] was diagnosed with DM 2017. Her hemoglobin A1c has ranged from 6.3% in 2022, peaking at 10.3% in 2020.   PCP prescribed Farxiga  05/2022 but pt developed dizziness to 10 mg dose.    THYROID  HISTORY: The patient has been diagnosed with hyperthyroid in 2019.  Thyroid  ultrasound showed multinodular goiter.   She is s/p benign FNA of the isthmic nodule in May 2006 and in 2018 She is S/P benign FNA of right mid thyroid  nodule 02/2015 and in 2019   Thyroid  ultrasound on 03/02/2021 showed 6 year stability of multiple thyroid  nodules and NO further ultrasound was recommended  She is S/P RAI ablation 31.5 mCi I-131 sodium iodide orally  03/19/2019  TFT's have been normal    SUBJECTIVE:   During the last visit (01/25/2023): A1c 6.1%   Today (09/20/2023): Victoria [name]  is here for a follow up on diabetes management. She checks her blood sugars 1 times daily. The patient has not had hypoglycemic episodes since the last clinic visit  She continues to follow-up with oncology for history of estrogen receptor positive breast cancer.  She is s/p left lumpectomy, on anastrozole   She is on oxygen  24 hrs a day  Has occasional nausea in the morning after taking her morning meds  Denies constipation or diarrhea     HOME DIABETES REGIMEN:  Janumet  50-1000 2 tabs daily  Farxiga  10 mg daily    Statin: yes ACE-I/ARB: no   METER DOWNLOAD SUMMARY: Did not bring      DIABETIC  COMPLICATIONS: Microvascular complications:   Denies: CKD, retinopathy  Last Eye Exam: Completed 2022  Macrovascular complications:   Denies: CAD, CVA, PVD   HISTORY:  Past Medical History:  Past Medical History:  Diagnosis Date   Abdominal aneurysm (HCC)    Dr. Nolene Baumgarten follows lLOV 2 ''17 per pt "around 2 cm"   Anemia    as a child   Arthritis    left ankle, right knee, right SI joint, wrists, lower back   Breast cancer in female [facility])    Left   COPD (chronic obstructive pulmonary disease) (HCC)    ephysema-Dr. 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 Sofia Dunn in OR [place]   Sleep apnea    cpap used sometimes, uses oxygen  concentrator 2 l/m nasally bedtime   Varicella as child   Past Surgical History:  Past Surgical History:  Procedure Laterality Date   APPENDECTOMY     2008  BREAST LUMPECTOMY Left 08/31/2017   BREAST LUMPECTOMY WITH RADIOACTIVE SEED LOCALIZATION Left 08/10/2017   Procedure: BREAST LUMPECTOMY WITH RADIOACTIVE SEED LOCALIZATION;  Surgeon: Enid Harry, MD;  Location[facility] OR;  Service: General;  Laterality: Left;   CESAREAN SECTION     1972   COLONOSCOPY     COLONOSCOPY WITH PROPOFOL  N/A 04/16/2016   Procedure: COLONOSCOPY WITH PROPOFOL ;  Surgeon: Alvis Jourdain, MD;  Location: [facility];  Service: Endoscopy;  Laterality: N/A;   CYST REMOVAL NECK Left 04/13/2016   Procedure: EXCISION OF SEBACEOUS CYST LEFT POSTERIOR NECK;  Surgeon: Oralee Billow, MD;  Location: [facility] OR;  Service: General;  Laterality: Left;   Excision of Pelvic Absess, Right Ovary     2008    RE-EXCISION OF BREAST CANCER,SUPERIOR MARGINS Left 08/31/2017   Procedure: RE-EXCISION OF LEFT  BREAST MARGINS ERAS PATHWAY;  Surgeon: Enid Harry, MD;  Location: [facility] OR;  Service: General;  Laterality: Left;   TUBAL LIGATION  1980   Social History:  reports that she quit smoking about 12 years ago. Her smoking use included cigarettes. She started smoking about 62 years ago. She has a 50 pack-year smoking history. She has never used smokeless tobacco. She reports current alcohol use. She reports that she does not use drugs. Family History:  Family History  Problem Relation Age of Onset   Heart disease Mother        MI - fatal   Hypertension Mother    Stroke Father 31       fatal   Alzheimer's disease Father    Alzheimer's disease Brother    Hyperlipidemia Brother    Hypertension Brother    Diabetes Brother    Hypertension Brother    Hyperlipidemia Brother    Breast cancer Paternal Grandmother      HOME MEDICATIONS: Allergies as of 09/20/2023       Reactions   Benicar  [olmesartan ] Swelling   Swelling of face and arms    [name]  [valsartan ] Swelling   Swelling of face and arms    Hydrocodone -acetaminophen  Nausea And Vomiting   Severe vomiting   Lisinopril  Cough   Monosodium Glutamate Other (See Comments)   Facial swelling per pt   Codeine Other (See Comments)   jittery   Lead Acetate Rash   Nickel Rash   Severe rash to infection: pt is allergic to all metals other than sterling silver or gold jewelry.         Medication List        Accurate as of Sep 20, 2023  2:57 PM. If you have any questions, ask your nurse or doctor.          Accu-Chek Aviva Plus w/Device Kit Use to monitor glucose levels once per day; E11.9   Accu-Chek Softclix Lancets lancets Use as instructed to check blood sugar 1x daily   acetaminophen  500 MG tablet Commonly known as: TYLENOL  Take 1,000 mg by mouth every 6 (six) hours as needed.   amLODipine  10 MG tablet Commonly known as:  NORVASC  TAKE ONE TABLET BY MOUTH ONCE DAILY   aspirin  EC 81 MG tablet Take 1 tablet (81 mg total) by mouth daily.   carvedilol  6.25 MG tablet Commonly known as: COREG  TAKE ONE TABLET BY MOUTH EVERY MORNING and TAKE ONE TABLET BY MOUTH EVERYDAY AT BEDTIME   dapagliflozin  propanediol 10 MG Tabs tablet Commonly known as: Farxiga  Take 1 tablet (10 mg total) by mouth daily.   FeroSul 325 (65 Fe) MG tablet Generic drug: ferrous  sulfate Take 325 mg by mouth 3 (three) times a week.   glucose blood test strip USE 1 STRIP ONCE DAILY   Accu-Chek Guide test strip Generic drug: glucose blood USE 1 STRIP ONCE DAILY   Accu-Chek Aviva Plus test strip Generic drug: glucose blood USE TO Check blood glucose 1x DAILY   Janumet  XR 50-1000 MG Tb24 Generic drug: SitaGLIPtin-MetFORMIN HCl Take 2 tablets by mouth daily before supper.   methocarbamol  500 MG tablet Commonly known as: ROBAXIN  Take 1 tablet (500 mg total) by mouth every 8 (eight) hours as needed for muscle spasms.   OXYGEN  Inhale 2-3 L into the lungs continuous. When exerting self   Potassium Chloride  ER 20 MEQ Tbcr Take 1 tablet by mouth every morning.   potassium chloride  SA 20 MEQ tablet Commonly known as: KLOR-CON  M Take 1 tablet (20 mEq total) by mouth 2 (two) times daily.   ProAir  RespiClick 108 (90 Base) MCG/ACT Aepb Generic drug: Albuterol  Sulfate Inhale 1-2 puffs into the lungs every 6 (six) hours as needed (for wheezing/shortness of breath).   albuterol  (2.5 MG/3ML) 0.083% nebulizer solution Commonly known as: PROVENTIL  Take 3 mLs (2.5 mg total) by nebulization every 6 (six) hours as needed for wheezing or shortness of breath.   Prolia  60 MG/ML Sosy injection Generic drug: denosumab  Inject 60 mg into the skin every 6 (six) months.   roflumilast  500 MCG Tabs tablet Commonly known as: DALIRESP  TAKE ONE TABLET BY MOUTH EVERY MORNING   rosuvastatin  40 MG tablet Commonly known as: CRESTOR  TAKE ONE TABLET BY  MOUTH EVERYDAY AT BEDTIME   spironolactone  25 MG tablet Commonly known as: ALDACTONE  TAKE ONE TABLET BY MOUTH ONCE DAILY   thiamine  100 MG tablet Commonly known as: VITAMIN B1 TAKE 1 TABLET BY MOUTH EVERY OTHER DAY   thiamine  100 MG tablet Commonly known as: VITAMIN B1 Take 100 mg by mouth every other day.   Trelegy Ellipta  100-62.5-25 MCG/ACT Aepb Generic drug: Fluticasone -Umeclidin-Vilant INHALE ONE (1) PUFF BY MOUTH AND INTO THE LUNGS DAILY *REFILL REQUEST*   Vitamin D3 125 MCG (5000 UT) Caps TAKE ONE CAPSULE BY MOUTH ONCE DAILY         OBJECTIVE:   Vital Signs: BP 134/76 (BP Location: Left Arm, Patient Position: Sitting, Cuff Size: Normal)   Pulse 96   Ht 5' 4.5" (1.638 m)   Wt 186 lb (84.4 kg)   SpO2 97%   BMI 31.43 kg/m   Wt Readings from Last 3 Encounters:  09/20/23 186 lb (84.4 kg)  09/16/23 186 lb (84.4 kg)  02/15/23 194 lb 3.6 oz (88.1 kg)     Exam: General: Pt appears well and is in NAD  Lungs: Clear with good BS bilat   Heart: RRR   Extremities: No pretibial edema.   Neuro: MS is good with appropriate affect, pt is alert and Ox3    DM foot exam: 01/25/2023  The skin of the feet is without sores or ulcerations, multiple callus formation noted b/l The pedal pulses are 2+ on right and 2+ on left. The sensation is intact to a screening 5.07, 10 gram monofilament bilaterally        DATA REVIEWED:  Lab Results  Component Value Date   HGBA1C 6.1 (A) 01/25/2023   HGBA1C 7.2 (A) 07/19/2022   HGBA1C 7.1 (A) 03/25/2022    Latest Reference Range & Units 06/15/22 15:11  Sodium 135 - 145 mEq/L 138  Potassium 3.5 - 5.1 mEq/L 4.1  Chloride 96 - 112 mEq/L  99  CO2 19 - 32 mEq/L 34 (H)  Glucose 70 - 99 mg/dL 657 (H)  BUN 6 - 23 mg/dL 24 (H)  Creatinine 8.46 - 1.20 mg/dL 9.62  Calcium  8.4 - 10.5 mg/dL 9.9  GFR >95.28 mL/min 58.93 (L)    In office BG 130 mg/dL   ASSESSMENT / PLAN / RECOMMENDATIONS:   1) Type 2 Diabetes Mellitus, Optimally  controlled, Without complications - Most recent A1c of 5.0%. Goal A1c < 7.5 %.    -Per patient she had recent lab work which included an A1c of 5.0% through [employer], we have contacted the office to obtain these records -Given that her A1c is too low, I have recommended decreasing Janumet  from 2 tablets a day to 1 tablet a day - She is tolerating full dose of Farxiga , no change   MEDICATIONS: Please Janumet  50-1000 mg, 1 tabs before supper Continue Farxiga  10 mg, 1 tab every morning  EDUCATION / INSTRUCTIONS: BG monitoring instructions: Patient is instructed to check her blood sugars 1 times a day, fasting. Call [place] [facility] if: BG persistently < 70  I reviewed the Rule of 15 for the treatment of hypoglycemia in detail with the patient. Literature supplied.    2) Diabetic complications:  Eye: Does not have known diabetic retinopathy.  Neuro/ Feet: Does not have known diabetic peripheral neuropathy .  Renal: Patient does not have known baseline CKD. She   is not on an ACEI/ARB at present.     F/U in 6 months      Signed electronically by: Natale Bail, MD  [facility] Endocrinology  [facility] [address] [place], [place] [address] Phone: [phone] FAX: [phone]   CC: Vevelyn Gowers, NP [address] [place] [address] Phone: [phone]  Fax: [phone]  Return to Endocrinology clinic as below: Future Appointments  Date Time Provider Department Center  09/20/2023  3:00 PM Candies Palm, Julian Obey, MD [facility] None  09/18/2024 11:30 AM Murleen Arms, MD [facility] None

## 2023-09-30 ENCOUNTER — Other Ambulatory Visit (HOSPITAL_COMMUNITY): Payer: Self-pay

## 2023-10-11 ENCOUNTER — Other Ambulatory Visit (HOSPITAL_COMMUNITY): Payer: Self-pay

## 2023-10-18 ENCOUNTER — Ambulatory Visit: Admitting: Internal Medicine

## 2023-12-11 ENCOUNTER — Other Ambulatory Visit: Payer: Self-pay | Admitting: Internal Medicine

## 2023-12-28 ENCOUNTER — Ambulatory Visit: Payer: Self-pay | Admitting: Internal Medicine

## 2023-12-28 NOTE — Telephone Encounter (Signed)
 FYI Only or Action Required?: FYI only for provider.  Patient is followed in Pulmonology for COPD, last seen on 12/20/2022 by Geronimo Amel, MD.  Called Nurse Triage reporting Cough/Congestion.  Symptoms began 2 days ago.  Interventions attempted: OTC medications: Zyrtec , Arthritis Strength Tylenol , Maintenance inhaler, Home oxygen  use, and Increased fluids/rest.  Symptoms are: gradually worsening.  Triage Disposition: See HCP Within 4 Hours (Or PCP Triage)  Patient/caregiver understands and will follow disposition?: Yes--Patient getting her son to take her to her PCP for a Walk In appointment today due to no availability in Pulmonary office today              Copied from CRM [id number]. Topic: Clinical - Red Word Triage >> Dec 28, 2023  8:45 AM [place] KIDD wrote: [facility] that prompted transfer to Nurse Triage: not doing good . coming back from out of town . coming out of [place]  chest congestion and running a fever but sweated that out oxygen  is still on level 2 want something to take away this chest congestion  Want a recommendation on some over the counter chest congestion Reason for Disposition  [1] MILD difficulty breathing (e.g., minimal/no SOB at rest, SOB with walking, pulse < 100) AND [2] still present when not coughing    More frequent than at baseline---patient also states she had a little wheezing  Answer Assessment - Initial Assessment Questions Patient states that she has been getting short of breath on exertion more than at her baseline Patient is advised that the recommendation is for her to be seen and evaluated in the next four hours She states that she will get her son to take her to [facility]--her PCP that does Walk-ins. She is also advised that if anything gets worse to go to the Emergency Room or call 911 Patient verbalized understanding and states that she is going to contact [facility] now to get set up for coming in today to be  seen and evaluated    E2C2 Pulmonary Triage - Initial Assessment Questions Chief Complaint (e.g., cough, sob, wheezing, fever, chills, sweat or additional symptoms) *Go to specific symptom protocol after initial questions. Productive cough with white phlegm that is starting to turn yellow  How long have symptoms been present? 2 days  Have you tested for COVID or Flu? Note: If not, ask patient if a home test can be taken. If so, instruct patient to call back for positive results. No  MEDICINES:   Have you used any OTC meds to help with symptoms? Yes If yes, ask What medications? Zyrtec  Arthritis Strength Tylenol   Have you used your inhalers/maintenance medication? Yes If yes, What medications? Trelegy Ellipta --INHALE ONE (1) PUFF BY MOUTH AND INTO THE LUNGS DAILY   If inhaler, ask How many puffs and how often? Note: Review instructions on medication in the chart. Trelegy Ellipta --INHALE ONE (1) PUFF BY MOUTH AND INTO THE LUNGS DAILY   OXYGEN : Do you wear supplemental oxygen ? Yes If yes, How many liters are you supposed to use? 2 liters  Do you monitor your oxygen  levels? Yes If yes, What is your reading (oxygen  level) today? N/A  What is your usual oxygen  saturation reading?  (Note: Pulmonary O2 sats should be 90% or greater) Normally between 94-98%   1. ONSET: When did the cough begin?      2 days ago after sinus drainage 2. SEVERITY: How bad is the cough today?      persistent 3. SPUTUM:  Describe the color of your sputum (e.g., none, dry cough; clear, white, yellow, green)     white 4. HEMOPTYSIS: Are you coughing up any blood? If Yes, ask: How much? (e.g., flecks, streaks, tablespoons, etc.)     No 5. DIFFICULTY BREATHING: Are you having difficulty breathing? If Yes, ask: How bad is it? (e.g., mild, moderate, severe)      Shortness of breath 6. FEVER: Do you have a fever? If Yes, ask: What is your temperature, how was it  measured, and when did it start?     Unknown 7. CARDIAC HISTORY: Do you have any history of heart disease? (e.g., heart attack, congestive heart failure)      Hypertension,  8. LUNG HISTORY: Do you have any history of lung disease?  (e.g., pulmonary embolus, asthma, emphysema)     COPD 9. PE RISK FACTORS: Do you have a history of blood clots? (or: recent major surgery, recent prolonged travel, bedridden)     No 10. OTHER SYMPTOMS: Do you have any other symptoms? (e.g., runny nose, wheezing, chest pain)       Sinus drainage  Protocols used: Cough - Acute Productive-A-AH

## 2023-12-30 ENCOUNTER — Ambulatory Visit

## 2024-01-27 ENCOUNTER — Ambulatory Visit

## 2024-02-07 ENCOUNTER — Ambulatory Visit

## 2024-02-09 ENCOUNTER — Ambulatory Visit

## 2024-02-14 ENCOUNTER — Ambulatory Visit
Admission: RE | Admit: 2024-02-14 | Discharge: 2024-02-14 | Disposition: A | Discharge status: Home / Self Care | Source: Ambulatory Visit | Attending: Hematology and Oncology | Admitting: Hematology and Oncology

## 2024-02-14 DIAGNOSIS — C50412 Malignant neoplasm of upper-outer quadrant of left female breast: Secondary | ICD-10-CM

## 2024-02-17 ENCOUNTER — Other Ambulatory Visit: Payer: Self-pay | Admitting: Hematology and Oncology

## 2024-02-17 DIAGNOSIS — R928 Other abnormal and inconclusive findings on diagnostic imaging of breast: Secondary | ICD-10-CM

## 2024-02-22 ENCOUNTER — Encounter

## 2024-02-22 ENCOUNTER — Other Ambulatory Visit

## 2024-02-23 ENCOUNTER — Other Ambulatory Visit: Payer: Self-pay | Admitting: Hematology and Oncology

## 2024-02-23 ENCOUNTER — Ambulatory Visit
Admission: RE | Admit: 2024-02-23 | Discharge: 2024-02-23 | Disposition: A | Discharge status: Home / Self Care | Source: Ambulatory Visit | Attending: Hematology and Oncology | Admitting: Hematology and Oncology

## 2024-02-23 DIAGNOSIS — R928 Other abnormal and inconclusive findings on diagnostic imaging of breast: Secondary | ICD-10-CM

## 2024-02-23 DIAGNOSIS — N6342 Unspecified lump in left breast, subareolar: Secondary | ICD-10-CM

## 2024-02-27 ENCOUNTER — Emergency Department (HOSPITAL_COMMUNITY)

## 2024-02-27 ENCOUNTER — Inpatient Hospital Stay (HOSPITAL_COMMUNITY)
Admission: EM | Admit: 2024-02-27 | Discharge: 2024-03-17 | DRG: 870 | Disposition: E | Attending: Internal Medicine | Admitting: Internal Medicine

## 2024-02-27 DIAGNOSIS — E669 Obesity, unspecified: Secondary | ICD-10-CM | POA: Diagnosis present

## 2024-02-27 DIAGNOSIS — Z66 Do not resuscitate: Secondary | ICD-10-CM | POA: Diagnosis not present

## 2024-02-27 DIAGNOSIS — I5033 Acute on chronic diastolic (congestive) heart failure: Secondary | ICD-10-CM | POA: Diagnosis present

## 2024-02-27 DIAGNOSIS — R410 Disorientation, unspecified: Secondary | ICD-10-CM

## 2024-02-27 DIAGNOSIS — T502X5A Adverse effect of carbonic-anhydrase inhibitors, benzothiadiazides and other diuretics, initial encounter: Secondary | ICD-10-CM | POA: Diagnosis not present

## 2024-02-27 DIAGNOSIS — J9601 Acute respiratory failure with hypoxia: Secondary | ICD-10-CM | POA: Diagnosis not present

## 2024-02-27 DIAGNOSIS — R4182 Altered mental status, unspecified: Secondary | ICD-10-CM | POA: Diagnosis present

## 2024-02-27 DIAGNOSIS — Y831 Surgical operation with implant of artificial internal device as the cause of abnormal reaction of the patient, or of later complication, without mention of misadventure at the time of the procedure: Secondary | ICD-10-CM | POA: Diagnosis not present

## 2024-02-27 DIAGNOSIS — N179 Acute kidney failure, unspecified: Secondary | ICD-10-CM | POA: Diagnosis present

## 2024-02-27 DIAGNOSIS — Z91048 Other nonmedicinal substance allergy status: Secondary | ICD-10-CM

## 2024-02-27 DIAGNOSIS — Z79899 Other long term (current) drug therapy: Secondary | ICD-10-CM

## 2024-02-27 DIAGNOSIS — K279 Peptic ulcer, site unspecified, unspecified as acute or chronic, without hemorrhage or perforation: Secondary | ICD-10-CM | POA: Diagnosis present

## 2024-02-27 DIAGNOSIS — Z515 Encounter for palliative care: Secondary | ICD-10-CM

## 2024-02-27 DIAGNOSIS — Z9049 Acquired absence of other specified parts of digestive tract: Secondary | ICD-10-CM

## 2024-02-27 DIAGNOSIS — I959 Hypotension, unspecified: Secondary | ICD-10-CM | POA: Diagnosis not present

## 2024-02-27 DIAGNOSIS — I330 Acute and subacute infective endocarditis: Secondary | ICD-10-CM | POA: Diagnosis present

## 2024-02-27 DIAGNOSIS — I63411 Cerebral infarction due to embolism of right middle cerebral artery: Secondary | ICD-10-CM | POA: Diagnosis present

## 2024-02-27 DIAGNOSIS — M810 Age-related osteoporosis without current pathological fracture: Secondary | ICD-10-CM | POA: Diagnosis present

## 2024-02-27 DIAGNOSIS — A409 Streptococcal sepsis, unspecified: Secondary | ICD-10-CM | POA: Diagnosis not present

## 2024-02-27 DIAGNOSIS — Z6831 Body mass index (BMI) 31.0-31.9, adult: Secondary | ICD-10-CM

## 2024-02-27 DIAGNOSIS — G928 Other toxic encephalopathy: Secondary | ICD-10-CM | POA: Diagnosis present

## 2024-02-27 DIAGNOSIS — Z833 Family history of diabetes mellitus: Secondary | ICD-10-CM

## 2024-02-27 DIAGNOSIS — B955 Unspecified streptococcus as the cause of diseases classified elsewhere: Secondary | ICD-10-CM

## 2024-02-27 DIAGNOSIS — Z8673 Personal history of transient ischemic attack (TIA), and cerebral infarction without residual deficits: Secondary | ICD-10-CM

## 2024-02-27 DIAGNOSIS — I76 Septic arterial embolism: Secondary | ICD-10-CM | POA: Diagnosis not present

## 2024-02-27 DIAGNOSIS — A419 Sepsis, unspecified organism: Principal | ICD-10-CM

## 2024-02-27 DIAGNOSIS — Z880 Allergy status to penicillin: Secondary | ICD-10-CM

## 2024-02-27 DIAGNOSIS — N1831 Chronic kidney disease, stage 3a: Secondary | ICD-10-CM | POA: Diagnosis present

## 2024-02-27 DIAGNOSIS — Z885 Allergy status to narcotic agent status: Secondary | ICD-10-CM

## 2024-02-27 DIAGNOSIS — Z9981 Dependence on supplemental oxygen: Secondary | ICD-10-CM

## 2024-02-27 DIAGNOSIS — Z803 Family history of malignant neoplasm of breast: Secondary | ICD-10-CM

## 2024-02-27 DIAGNOSIS — Z888 Allergy status to other drugs, medicaments and biological substances status: Secondary | ICD-10-CM

## 2024-02-27 DIAGNOSIS — Z853 Personal history of malignant neoplasm of breast: Secondary | ICD-10-CM

## 2024-02-27 DIAGNOSIS — M6282 Rhabdomyolysis: Secondary | ICD-10-CM | POA: Diagnosis not present

## 2024-02-27 DIAGNOSIS — E1122 Type 2 diabetes mellitus with diabetic chronic kidney disease: Secondary | ICD-10-CM | POA: Diagnosis present

## 2024-02-27 DIAGNOSIS — T827XXA Infection and inflammatory reaction due to other cardiac and vascular devices, implants and grafts, initial encounter: Secondary | ICD-10-CM | POA: Diagnosis not present

## 2024-02-27 DIAGNOSIS — R1310 Dysphagia, unspecified: Secondary | ICD-10-CM | POA: Diagnosis present

## 2024-02-27 DIAGNOSIS — W19XXXA Unspecified fall, initial encounter: Secondary | ICD-10-CM

## 2024-02-27 DIAGNOSIS — Z1152 Encounter for screening for COVID-19: Secondary | ICD-10-CM

## 2024-02-27 DIAGNOSIS — R29722 NIHSS score 22: Secondary | ICD-10-CM | POA: Diagnosis not present

## 2024-02-27 DIAGNOSIS — R509 Fever, unspecified: Secondary | ICD-10-CM

## 2024-02-27 DIAGNOSIS — I38 Endocarditis, valve unspecified: Secondary | ICD-10-CM | POA: Diagnosis present

## 2024-02-27 DIAGNOSIS — E785 Hyperlipidemia, unspecified: Secondary | ICD-10-CM | POA: Diagnosis present

## 2024-02-27 DIAGNOSIS — Z7982 Long term (current) use of aspirin: Secondary | ICD-10-CM

## 2024-02-27 DIAGNOSIS — I6340 Cerebral infarction due to embolism of unspecified cerebral artery: Secondary | ICD-10-CM

## 2024-02-27 DIAGNOSIS — Z83438 Family history of other disorder of lipoprotein metabolism and other lipidemia: Secondary | ICD-10-CM

## 2024-02-27 DIAGNOSIS — J15211 Pneumonia due to Methicillin susceptible Staphylococcus aureus: Secondary | ICD-10-CM | POA: Diagnosis not present

## 2024-02-27 DIAGNOSIS — Z7984 Long term (current) use of oral hypoglycemic drugs: Secondary | ICD-10-CM

## 2024-02-27 DIAGNOSIS — Z87891 Personal history of nicotine dependence: Secondary | ICD-10-CM

## 2024-02-27 DIAGNOSIS — J439 Emphysema, unspecified: Secondary | ICD-10-CM | POA: Diagnosis present

## 2024-02-27 DIAGNOSIS — I130 Hypertensive heart and chronic kidney disease with heart failure and stage 1 through stage 4 chronic kidney disease, or unspecified chronic kidney disease: Secondary | ICD-10-CM | POA: Diagnosis present

## 2024-02-27 DIAGNOSIS — R6520 Severe sepsis without septic shock: Secondary | ICD-10-CM | POA: Diagnosis present

## 2024-02-27 DIAGNOSIS — Z8249 Family history of ischemic heart disease and other diseases of the circulatory system: Secondary | ICD-10-CM

## 2024-02-27 DIAGNOSIS — Y95 Nosocomial condition: Secondary | ICD-10-CM | POA: Diagnosis not present

## 2024-02-27 DIAGNOSIS — Z9102 Food additives allergy status: Secondary | ICD-10-CM

## 2024-02-27 DIAGNOSIS — R001 Bradycardia, unspecified: Secondary | ICD-10-CM | POA: Diagnosis not present

## 2024-02-27 DIAGNOSIS — I4891 Unspecified atrial fibrillation: Secondary | ICD-10-CM | POA: Diagnosis not present

## 2024-02-27 LAB — I-STAT CG4 LACTIC ACID, ED
Lactic Acid, Venous: 2.6 mmol/L (ref 0.5–1.9)
Lactic Acid, Venous: 4.1 mmol/L (ref 0.5–1.9)

## 2024-02-27 LAB — PROTIME-INR
INR: 0.9 (ref 0.8–1.2)
Prothrombin Time: 13.2 s (ref 11.4–15.2)

## 2024-02-27 LAB — URINALYSIS, W/ REFLEX TO CULTURE (INFECTION SUSPECTED)
Bilirubin Urine: NEGATIVE
Glucose, UA: 150 mg/dL — AB
Ketones, ur: NEGATIVE mg/dL
Leukocytes,Ua: NEGATIVE
Nitrite: NEGATIVE
Protein, ur: >=300 mg/dL — AB
Specific Gravity, Urine: 1.026 (ref 1.005–1.030)
pH: 5 (ref 5.0–8.0)

## 2024-02-27 LAB — CBC WITH DIFFERENTIAL/PLATELET
Abs Immature Granulocytes: 0.09 K/uL — ABNORMAL HIGH (ref 0.00–0.07)
Basophils Absolute: 0 K/uL (ref 0.0–0.1)
Basophils Relative: 0 %
Eosinophils Absolute: 0 K/uL (ref 0.0–0.5)
Eosinophils Relative: 0 %
HCT: 42.3 % (ref 36.0–46.0)
Hemoglobin: 13.2 g/dL (ref 12.0–15.0)
Immature Granulocytes: 1 %
Lymphocytes Relative: 6 %
Lymphs Abs: 1 K/uL (ref 0.7–4.0)
MCH: 27.4 pg (ref 26.0–34.0)
MCHC: 31.2 g/dL (ref 30.0–36.0)
MCV: 87.8 fL (ref 80.0–100.0)
Monocytes Absolute: 1.3 K/uL — ABNORMAL HIGH (ref 0.1–1.0)
Monocytes Relative: 8 %
Neutro Abs: 13.9 K/uL — ABNORMAL HIGH (ref 1.7–7.7)
Neutrophils Relative %: 85 %
Platelets: 240 K/uL (ref 150–400)
RBC: 4.82 MIL/uL (ref 3.87–5.11)
RDW: 14.5 % (ref 11.5–15.5)
WBC: 16.3 K/uL — ABNORMAL HIGH (ref 4.0–10.5)
nRBC: 0 % (ref 0.0–0.2)

## 2024-02-27 LAB — I-STAT VENOUS BLOOD GAS, ED
Acid-Base Excess: 5 mmol/L — ABNORMAL HIGH (ref 0.0–2.0)
Bicarbonate: 27.5 mmol/L (ref 20.0–28.0)
Calcium, Ion: 1.07 mmol/L — ABNORMAL LOW (ref 1.15–1.40)
HCT: 38 % (ref 36.0–46.0)
Hemoglobin: 12.9 g/dL (ref 12.0–15.0)
O2 Saturation: 96 %
Potassium: 3.3 mmol/L — ABNORMAL LOW (ref 3.5–5.1)
Sodium: 138 mmol/L (ref 135–145)
TCO2: 29 mmol/L (ref 22–32)
pCO2, Ven: 34.2 mmHg — ABNORMAL LOW (ref 44–60)
pH, Ven: 7.514 — ABNORMAL HIGH (ref 7.25–7.43)
pO2, Ven: 70 mmHg — ABNORMAL HIGH (ref 32–45)

## 2024-02-27 LAB — CK: Total CK: 661 U/L — ABNORMAL HIGH (ref 38–234)

## 2024-02-27 LAB — COMPREHENSIVE METABOLIC PANEL WITH GFR
ALT: 43 U/L (ref 0–44)
AST: 62 U/L — ABNORMAL HIGH (ref 15–41)
Albumin: 3.3 g/dL — ABNORMAL LOW (ref 3.5–5.0)
Alkaline Phosphatase: 91 U/L (ref 38–126)
Anion gap: 13 (ref 5–15)
BUN: 16 mg/dL (ref 8–23)
CO2: 24 mmol/L (ref 22–32)
Calcium: 9.3 mg/dL (ref 8.9–10.3)
Chloride: 100 mmol/L (ref 98–111)
Creatinine, Ser: 1 mg/dL (ref 0.44–1.00)
GFR, Estimated: 57 mL/min — ABNORMAL LOW (ref >60)
Glucose, Bld: 228 mg/dL — ABNORMAL HIGH (ref 70–99)
Potassium: 4.1 mmol/L (ref 3.5–5.1)
Sodium: 137 mmol/L (ref 135–145)
Total Bilirubin: 0.8 mg/dL (ref 0.0–1.2)
Total Protein: 6.9 g/dL (ref 6.5–8.1)

## 2024-02-27 LAB — TROPONIN I (HIGH SENSITIVITY): Troponin I (High Sensitivity): 337 ng/L (ref <18)

## 2024-02-27 LAB — CBG MONITORING, ED: Glucose-Capillary: 230 mg/dL — ABNORMAL HIGH (ref 70–99)

## 2024-02-27 MED ORDER — SODIUM CHLORIDE 0.9 % IV SOLN
2.0000 g | Freq: Once | INTRAVENOUS | Status: AC
  Administered 2024-02-27: 2 g via INTRAVENOUS
  Filled 2024-02-27: qty 12.5

## 2024-02-27 MED ORDER — LORAZEPAM 2 MG/ML IJ SOLN
1.0000 mg | Freq: Once | INTRAMUSCULAR | Status: AC
  Administered 2024-02-27: 1 mg via INTRAVENOUS
  Filled 2024-02-27: qty 1

## 2024-02-27 MED ORDER — VANCOMYCIN HCL IN DEXTROSE 1-5 GM/200ML-% IV SOLN
1000.0000 mg | Freq: Once | INTRAVENOUS | Status: AC
  Administered 2024-02-27: 1000 mg via INTRAVENOUS
  Filled 2024-02-27: qty 200

## 2024-02-27 MED ORDER — SODIUM CHLORIDE 0.9 % IV BOLUS
1000.0000 mL | Freq: Once | INTRAVENOUS | Status: AC
  Administered 2024-02-27: 1000 mL via INTRAVENOUS

## 2024-02-27 MED ORDER — KETOROLAC TROMETHAMINE 15 MG/ML IJ SOLN
15.0000 mg | Freq: Once | INTRAMUSCULAR | Status: AC
  Administered 2024-02-27: 15 mg via INTRAVENOUS
  Filled 2024-02-27: qty 1

## 2024-02-27 MED ORDER — ZIPRASIDONE MESYLATE 20 MG IM SOLR
20.0000 mg | Freq: Once | INTRAMUSCULAR | Status: AC
  Administered 2024-02-27: 20 mg via INTRAMUSCULAR
  Filled 2024-02-27: qty 20

## 2024-02-27 MED ORDER — LACTATED RINGERS IV SOLN: INTRAVENOUS | Status: AC

## 2024-02-27 MED ORDER — LACTATED RINGERS IV BOLUS (SEPSIS)
1000.0000 mL | Freq: Once | INTRAVENOUS | Status: AC
  Administered 2024-02-27: 1000 mL via INTRAVENOUS

## 2024-02-27 MED ORDER — METRONIDAZOLE 500 MG/100ML IV SOLN
500.0000 mg | Freq: Once | INTRAVENOUS | Status: AC
  Administered 2024-02-27: 500 mg via INTRAVENOUS
  Filled 2024-02-27: qty 100

## 2024-02-27 MED ORDER — SODIUM CHLORIDE 0.9 % IV SOLN: 2.0000 g | Freq: Once | INTRAVENOUS | Status: DC

## 2024-02-27 MED ORDER — ACETAMINOPHEN 10 MG/ML IV SOLN
1000.0000 mg | Freq: Four times a day (QID) | INTRAVENOUS | Status: AC
  Administered 2024-02-28 (×4): 1000 mg via INTRAVENOUS
  Filled 2024-02-27 (×4): qty 100

## 2024-02-27 NOTE — ED Provider Notes (Signed)
 [name] EM[facility]GENCY DE[facility]MENT AT [facility] Provider Note   CSN: [id number] Arrival date & time: 02/27/24  2056     Patient presents with: Code Sepsis   Victoria [name] is a 78 y.o. female.   Pt is a 78 yo female presenting after being found down in her home after a neighbor called EMS when they couldn't reach the patient. Daughter in law, [name], spoke with the patient last night and states that she sounded a little confused and that the patient had difficulty getting her thoughts together. During that conversation, the patient had stated that she had felt unwell since Friday, 3 days prior, but relayed no specific symptoms. Other family visited the patient on Friday and again noted no infectious symptoms. EMS found patient down on the floor, febrile, tachycardic, and confused. Pt was unaware that she was on the floor or what happened. Patient lives alone.     [name] baseline, as per family, lives alone and cares for herself. Cooks, cleans, walks, and drives. Has sob with exertion always secondary to emphysema/copd. On home oxygen -liters unknown.   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 No language interpreter was used.       Prior to Admission medications   Medication Sig Start Date End Date Taking? Authorizing Provider  Accu-Chek Softclix Lancets lancets Use as instructed to check blood sugar 1x daily 03/30/23   Shamleffer, Ibtehal Jaralla, MD  acetaminophen  (TYLENOL ) 500 MG tablet Take 1,000 mg by mouth every 6 (six) hours as needed.    [provider]  albuterol  (PROVENTIL ) (2.5 MG/3ML) 0.083% nebulizer solution Take 3 mLs (2.5 mg total) by nebulization every 6 (six) hours as needed for wheezing or shortness of breath. 12/31/21   Parrett, Madelin RAMAN, NP  Albuterol  Sulfate (PROAIR  RESPICLICK) 108 (90 Base) MCG/ACT AEPB Inhale 1-2 puffs into the lungs every 6 (six) hours as needed (for wheezing/shortness of  breath). 03/31/20   Geronimo Amel, MD  amLODipine  (NORVASC ) 10 MG tablet TAKE ONE TABLET BY MOUTH ONCE DAILY 11/24/22   Burnard Debby LABOR, MD  aspirin  EC 81 MG tablet Take 1 tablet (81 mg total) by mouth daily. 07/23/19   Kroeger, Krista M., PA-C  Blood Glucose Monitoring Suppl (ACCU-CHEK AVIVA PLUS) w/Device KIT Use to monitor glucose levels once per day; E11.9 06/10/23   Shamleffer, Donell Cardinal, MD  carvedilol  (COREG ) 6.25 MG tablet TAKE ONE TABLET BY MOUTH EVERY MORNING and TAKE ONE TABLET BY MOUTH EVERYDAY AT BEDTIME 11/24/22   Burnard Debby LABOR, MD  cetirizine  (ZYRTEC ) 10 MG tablet Take 10 mg by mouth every morning. 12/28/23   [provider]  Cholecalciferol (VITAMIN D ) 50 MCG (2000 UT) tablet Take 2,000 Units by mouth daily. 02/07/24   [provider]  Cholecalciferol (VITAMIN D3) 125 MCG (5000 UT) CAPS TAKE ONE CAPSULE BY MOUTH ONCE DAILY 12/29/22   Molt Debby CROME, MD  dapagliflozin  propanediol (FARXIGA ) 10 MG TABS tablet Take 1 tablet (10 mg total) by mouth daily. 09/20/23   Shamleffer, Ibtehal Jaralla, MD  denosumab  (PROLIA ) 60 MG/ML SOSY injection Inject 60 mg into the skin every 6 (six) months. 06/16/22   Molt Debby CROME, MD  FEROSUL 325 (65 Fe) MG tablet Take 325 mg by mouth 3 (three) times a week. 08/09/23   [provider]  Fluticasone -Umeclidin-Vilant (TRELEGY ELLIPTA ) 100-62.5-25 MCG/ACT AEPB INHALE ONE (1) PUFF BY MOUTH AND INTO THE LUNGS DAILY 12/12/23   Geronimo Amel, MD  glucose  blood (ACCU-CHEK AVIVA PLUS) test strip USE TO Check blood glucose 1x DAILY 06/03/23   Shamleffer, Ibtehal Jaralla, MD  glucose blood (ACCU-CHEK GUIDE) test strip USE 1 STRIP ONCE DAILY 04/09/20   Kassie Mallick, MD  glucose blood test strip USE 1 STRIP ONCE DAILY 02/29/20   Kassie Mallick, MD  methocarbamol  (ROBAXIN ) 500 MG tablet Take 1 tablet (500 mg total) by mouth every 8 (eight) hours as needed for muscle spasms. 08/19/22   Elnor Lauraine BRAVO, NP  OXYGEN  Inhale 2-3 L into the lungs  continuous. When exerting self    [provider]  Potassium Chloride  ER 20 MEQ TBCR Take 1 tablet by mouth every morning. 01/07/23   [provider]  roflumilast  (DALIRESP ) 500 MCG TABS tablet TAKE ONE TABLET BY MOUTH EVERY MORNING 11/25/22   Geronimo Amel, MD  rosuvastatin  (CRESTOR ) 40 MG tablet TAKE ONE TABLET BY MOUTH EVERYDAY AT BEDTIME 11/24/22   Burnard Debby LABOR, MD  SitaGLIPtin -MetFORMIN  HCl (JANUMET  XR) 50-1000 MG TB24 Take 1 tablet by mouth daily before supper. 09/20/23   Shamleffer, Donell Cardinal, MD  spironolactone  (ALDACTONE ) 25 MG tablet TAKE ONE TABLET BY MOUTH ONCE DAILY 11/24/22   Burnard Debby LABOR, MD  thiamine  (VITAMIN B1) 100 MG tablet TAKE 1 TABLET BY MOUTH EVERY OTHER DAY 01/20/23   Joshua Debby CROME, MD    Allergies: Benicar  [olmesartan ], [name]  [valsartan ], Hydrocodone -acetaminophen , Lisinopril , Monosodium glutamate, Codeine, Lead acetate, and Nickel    Review of Systems  Unable to perform ROS: Mental status change    Updated Vital Signs BP (!) 195/45   Pulse (!) 102   Temp 99.5 F (37.5 C)   Resp (!) 29   Ht 5' 4.5 (1.638 m)   Wt 99.3 kg   SpO2 94%   BMI 37.00 kg/m   Physical Exam Vitals and nursing note reviewed.  Constitutional:      Appearance: She is well-developed. She is ill-appearing and diaphoretic.  HENT:     Head: Normocephalic and atraumatic.  Eyes:     Conjunctiva/sclera: Conjunctivae normal.  Cardiovascular:     Rate and Rhythm: Regular rhythm. Tachycardia present.     Heart sounds: No murmur heard. Pulmonary:     Effort: Tachypnea and respiratory distress present.     Breath sounds: Normal breath sounds.  Abdominal:     Palpations: Abdomen is soft.     Tenderness: There is no abdominal tenderness.  Musculoskeletal:        General: No swelling.     Cervical back: Neck supple.  Skin:    General: Skin is warm.     Capillary Refill: Capillary refill takes less than 2 seconds.  Neurological:     Mental Status: She is  confused.     GCS: GCS eye subscore is 4. GCS verbal subscore is 2. GCS motor subscore is 4.     Comments: Patient is confused, pulls away from pain but does not follow commands, moving all four ext spontaneously. Pulling at lines, grabbing staff members. Eyes are closed but opens to speech.      (all labs ordered are listed, but only abnormal results are displayed) Labs Reviewed  CULTURE, BLOOD (ROUTINE X 2) - Abnormal; Notable for the following components:      Result Value   Culture   (*)    Value: STREPTOCOCCUS SALIVARIUS SUSCEPTIBILITIES PERFORMED ON PREVIOUS CULTURE WITHIN THE LAST 5 DAYS. Performed at [facility], [address], [place], [place] [address]    All other  components within normal limits  CULTURE, BLOOD (ROUTINE X 2) - Abnormal; Notable for the following components:   Culture STREPTOCOCCUS SALIVARIUS (*)    All other components within normal limits  BLOOD CULTURE ID PANEL (REFLEXED) - BCID2 - Abnormal; Notable for the following components:   Streptococcus species DETECTED (*)    All other components within normal limits  COMPREHENSIVE METABOLIC PANEL WITH GFR - Abnormal; Notable for the following components:   Glucose, Bld 228 (*)    Albumin 3.3 (*)    AST 62 (*)    GFR, Estimated 57 (*)    All other components within normal limits  CBC WITH DIFFERENTIAL/PLATELET - Abnormal; Notable for the following components:   WBC 16.3 (*)    Neutro Abs 13.9 (*)    Monocytes Absolute 1.3 (*)    Abs Immature Granulocytes 0.09 (*)    All other components within normal limits  URINALYSIS, W/ REFLEX TO CULTURE (INFECTION SUSPECTED) - Abnormal; Notable for the following components:   Color, Urine AMBER (*)    APPearance HAZY (*)    Glucose, UA 150 (*)    Hgb urine dipstick LARGE (*)    Protein, ur >=300 (*)    Bacteria, UA RARE (*)    All other components within normal limits  CK - Abnormal; Notable for the following components:   Total CK 661 (*)    All other  components within normal limits  CBC - Abnormal; Notable for the following components:   WBC 12.3 (*)    RBC 3.73 (*)    Hemoglobin 10.3 (*)    HCT 32.6 (*)    All other components within normal limits  CREATININE, SERUM - Abnormal; Notable for the following components:   Creatinine, Ser 1.18 (*)    GFR, Estimated 47 (*)    All other components within normal limits  HEMOGLOBIN A1C - Abnormal; Notable for the following components:   Hgb A1c MFr Bld 7.0 (*)    All other components within normal limits  MAGNESIUM - Abnormal; Notable for the following components:   Magnesium 1.5 (*)    All other components within normal limits  GLUCOSE, CAPILLARY - Abnormal; Notable for the following components:   Glucose-Capillary 183 (*)    All other components within normal limits  GLUCOSE, CAPILLARY - Abnormal; Notable for the following components:   Glucose-Capillary 152 (*)    All other components within normal limits  GLUCOSE, CAPILLARY - Abnormal; Notable for the following components:   Glucose-Capillary 128 (*)    All other components within normal limits  CBC - Abnormal; Notable for the following components:   WBC 12.9 (*)    Hemoglobin 11.1 (*)    HCT 35.9 (*)    All other components within normal limits  BASIC METABOLIC PANEL WITH GFR - Abnormal; Notable for the following components:   Potassium 3.4 (*)    Glucose, Bld 134 (*)    BUN 26 (*)    Creatinine, Ser 1.37 (*)    Calcium  8.1 (*)    GFR, Estimated 39 (*)    All other components within normal limits  MAGNESIUM - Abnormal; Notable for the following components:   Magnesium 1.6 (*)    All other components within normal limits  TRIGLYCERIDES - Abnormal; Notable for the following components:   Triglycerides 207 (*)    All other components within normal limits  CK - Abnormal; Notable for the following components:   Total CK 2,418 (*)  All other components within normal limits  GLUCOSE, CAPILLARY - Abnormal; Notable for the  following components:   Glucose-Capillary 106 (*)    All other components within normal limits  GLUCOSE, CAPILLARY - Abnormal; Notable for the following components:   Glucose-Capillary 137 (*)    All other components within normal limits  GLUCOSE, CAPILLARY - Abnormal; Notable for the following components:   Glucose-Capillary 124 (*)    All other components within normal limits  GLUCOSE, CAPILLARY - Abnormal; Notable for the following components:   Glucose-Capillary 135 (*)    All other components within normal limits  GLUCOSE, CAPILLARY - Abnormal; Notable for the following components:   Glucose-Capillary 124 (*)    All other components within normal limits  CSF CELL COUNT WITH DIFFERENTIAL - Abnormal; Notable for the following components:   RBC Count, CSF 3 (*)    All other components within normal limits  CSF CELL COUNT WITH DIFFERENTIAL - Abnormal; Notable for the following components:   RBC Count, CSF 1 (*)    WBC, CSF 6 (*)    All other components within normal limits  PROTEIN AND GLUCOSE, CSF - Abnormal; Notable for the following components:   Glucose, CSF 87 (*)    Total  Protein, CSF 53 (*)    All other components within normal limits  GLUCOSE, CAPILLARY - Abnormal; Notable for the following components:   Glucose-Capillary 115 (*)    All other components within normal limits  CK - Abnormal; Notable for the following components:   Total CK 1,638 (*)    All other components within normal limits  BASIC METABOLIC PANEL WITH GFR - Abnormal; Notable for the following components:   Glucose, Bld 121 (*)    BUN 30 (*)    Creatinine, Ser 1.10 (*)    Calcium  7.8 (*)    GFR, Estimated 51 (*)    All other components within normal limits  CBC - Abnormal; Notable for the following components:   WBC 12.3 (*)    RBC 3.77 (*)    Hemoglobin 10.5 (*)    HCT 33.8 (*)    RDW 15.6 (*)    Platelets 132 (*)    All other components within normal limits  GLUCOSE, CAPILLARY - Abnormal;  Notable for the following components:   Glucose-Capillary 135 (*)    All other components within normal limits  LIPID PANEL - Abnormal; Notable for the following components:   Triglycerides 166 (*)    All other components within normal limits  GLUCOSE, CAPILLARY - Abnormal; Notable for the following components:   Glucose-Capillary 109 (*)    All other components within normal limits  GLUCOSE, CAPILLARY - Abnormal; Notable for the following components:   Glucose-Capillary 110 (*)    All other components within normal limits  GLUCOSE, CAPILLARY - Abnormal; Notable for the following components:   Glucose-Capillary 130 (*)    All other components within normal limits  GLUCOSE, CAPILLARY - Abnormal; Notable for the following components:   Glucose-Capillary 152 (*)    All other components within normal limits  GLUCOSE, CAPILLARY - Abnormal; Notable for the following components:   Glucose-Capillary 162 (*)    All other components within normal limits  MAGNESIUM - Abnormal; Notable for the following components:   Magnesium 2.6 (*)    All other components within normal limits  GLUCOSE, CAPILLARY - Abnormal; Notable for the following components:   Glucose-Capillary 161 (*)    All other components within normal limits  GLUCOSE, CAPILLARY - Abnormal; Notable for the following components:   Glucose-Capillary 158 (*)    All other components within normal limits  GLUCOSE, CAPILLARY - Abnormal; Notable for the following components:   Glucose-Capillary 180 (*)    All other components within normal limits  BASIC METABOLIC PANEL WITH GFR - Abnormal; Notable for the following components:   Glucose, Bld 178 (*)    BUN 27 (*)    Calcium  8.2 (*)    GFR, Estimated 58 (*)    All other components within normal limits  CK - Abnormal; Notable for the following components:   Total CK 380 (*)    All other components within normal limits  GLUCOSE, CAPILLARY - Abnormal; Notable for the following  components:   Glucose-Capillary 133 (*)    All other components within normal limits  COMPREHENSIVE METABOLIC PANEL WITH GFR - Abnormal; Notable for the following components:   Glucose, Bld 178 (*)    BUN 27 (*)    Calcium  8.3 (*)    Total Protein 5.8 (*)    Albumin 2.1 (*)    All other components within normal limits  CK - Abnormal; Notable for the following components:   Total CK 389 (*)    All other components within normal limits  CBC WITH DIFFERENTIAL/PLATELET - Abnormal; Notable for the following components:   WBC 10.6 (*)    RBC 3.60 (*)    Hemoglobin 9.9 (*)    HCT 32.2 (*)    Neutro Abs 9.0 (*)    Lymphs Abs 0.6 (*)    All other components within normal limits  GLUCOSE, CAPILLARY - Abnormal; Notable for the following components:   Glucose-Capillary 138 (*)    All other components within normal limits  BASIC METABOLIC PANEL WITH GFR - Abnormal; Notable for the following components:   Glucose, Bld 189 (*)    BUN 27 (*)    Calcium  8.4 (*)    All other components within normal limits  GLUCOSE, CAPILLARY - Abnormal; Notable for the following components:   Glucose-Capillary 157 (*)    All other components within normal limits  CBC - Abnormal; Notable for the following components:   RBC 3.52 (*)    Hemoglobin 9.7 (*)    HCT 31.8 (*)    Platelets 126 (*)    All other components within normal limits  BASIC METABOLIC PANEL WITH GFR - Abnormal; Notable for the following components:   Glucose, Bld 166 (*)    BUN 24 (*)    Calcium  8.2 (*)    All other components within normal limits  GLUCOSE, CAPILLARY - Abnormal; Notable for the following components:   Glucose-Capillary 134 (*)    All other components within normal limits  GLUCOSE, CAPILLARY - Abnormal; Notable for the following components:   Glucose-Capillary 141 (*)    All other components within normal limits  GLUCOSE, CAPILLARY - Abnormal; Notable for the following components:   Glucose-Capillary 151 (*)    All  other components within normal limits  GLUCOSE, CAPILLARY - Abnormal; Notable for the following components:   Glucose-Capillary 156 (*)    All other components within normal limits  GLUCOSE, CAPILLARY - Abnormal; Notable for the following components:   Glucose-Capillary 144 (*)    All other components within normal limits  GLUCOSE, CAPILLARY - Abnormal; Notable for the following components:   Glucose-Capillary 154 (*)    All other components within normal limits  CBC - Abnormal; Notable for the following components:  RBC 3.51 (*)    Hemoglobin 9.7 (*)    HCT 31.8 (*)    RDW 15.7 (*)    Platelets 133 (*)    All other components within normal limits  BASIC METABOLIC PANEL WITH GFR - Abnormal; Notable for the following components:   Glucose, Bld 148 (*)    BUN 26 (*)    Creatinine, Ser 1.04 (*)    Calcium  8.3 (*)    GFR, Estimated 54 (*)    All other components within normal limits  GLUCOSE, CAPILLARY - Abnormal; Notable for the following components:   Glucose-Capillary 156 (*)    All other components within normal limits  GLUCOSE, CAPILLARY - Abnormal; Notable for the following components:   Glucose-Capillary 167 (*)    All other components within normal limits  GLUCOSE, CAPILLARY - Abnormal; Notable for the following components:   Glucose-Capillary 150 (*)    All other components within normal limits  GLUCOSE, CAPILLARY - Abnormal; Notable for the following components:   Glucose-Capillary 166 (*)    All other components within normal limits  GLUCOSE, CAPILLARY - Abnormal; Notable for the following components:   Glucose-Capillary 176 (*)    All other components within normal limits  GLUCOSE, CAPILLARY - Abnormal; Notable for the following components:   Glucose-Capillary 152 (*)    All other components within normal limits  CBC - Abnormal; Notable for the following components:   RBC 3.25 (*)    Hemoglobin 9.0 (*)    HCT 30.2 (*)    MCHC 29.8 (*)    RDW 15.9 (*)    All  other components within normal limits  BASIC METABOLIC PANEL WITH GFR - Abnormal; Notable for the following components:   Glucose, Bld 164 (*)    BUN 32 (*)    Creatinine, Ser 1.21 (*)    Calcium  8.1 (*)    GFR, Estimated 45 (*)    All other components within normal limits  GLUCOSE, CAPILLARY - Abnormal; Notable for the following components:   Glucose-Capillary 192 (*)    All other components within normal limits  GLUCOSE, CAPILLARY - Abnormal; Notable for the following components:   Glucose-Capillary 170 (*)    All other components within normal limits  BRAIN NATRIURETIC PEPTIDE - Abnormal; Notable for the following components:   B Natriuretic Peptide 301.3 (*)    All other components within normal limits  GLUCOSE, CAPILLARY - Abnormal; Notable for the following components:   Glucose-Capillary 162 (*)    All other components within normal limits  GLUCOSE, CAPILLARY - Abnormal; Notable for the following components:   Glucose-Capillary 163 (*)    All other components within normal limits  BASIC METABOLIC PANEL WITH GFR - Abnormal; Notable for the following components:   Glucose, Bld 173 (*)    BUN 36 (*)    Creatinine, Ser 1.22 (*)    Calcium  7.9 (*)    GFR, Estimated 45 (*)    All other components within normal limits  CBC - Abnormal; Notable for the following components:   RBC 3.32 (*)    Hemoglobin 9.0 (*)    HCT 30.4 (*)    MCHC 29.6 (*)    RDW 15.9 (*)    All other components within normal limits  BASIC METABOLIC PANEL WITH GFR - Abnormal; Notable for the following components:   CO2 33 (*)    Glucose, Bld 202 (*)    BUN 41 (*)    Creatinine, Ser 1.25 (*)  Calcium  8.7 (*)    GFR, Estimated 44 (*)    All other components within normal limits  BRAIN NATRIURETIC PEPTIDE - Abnormal; Notable for the following components:   B Natriuretic Peptide 315.9 (*)    All other components within normal limits  GLUCOSE, CAPILLARY - Abnormal; Notable for the following components:    Glucose-Capillary 171 (*)    All other components within normal limits  GLUCOSE, CAPILLARY - Abnormal; Notable for the following components:   Glucose-Capillary 155 (*)    All other components within normal limits  GLUCOSE, CAPILLARY - Abnormal; Notable for the following components:   Glucose-Capillary 166 (*)    All other components within normal limits  GLUCOSE, CAPILLARY - Abnormal; Notable for the following components:   Glucose-Capillary 180 (*)    All other components within normal limits  GLUCOSE, CAPILLARY - Abnormal; Notable for the following components:   Glucose-Capillary 196 (*)    All other components within normal limits  GLUCOSE, CAPILLARY - Abnormal; Notable for the following components:   Glucose-Capillary 196 (*)    All other components within normal limits  BASIC METABOLIC PANEL WITH GFR - Abnormal; Notable for the following components:   CO2 34 (*)    Glucose, Bld 242 (*)    BUN 46 (*)    Creatinine, Ser 1.21 (*)    GFR, Estimated 45 (*)    All other components within normal limits  GLUCOSE, CAPILLARY - Abnormal; Notable for the following components:   Glucose-Capillary 169 (*)    All other components within normal limits  GLUCOSE, CAPILLARY - Abnormal; Notable for the following components:   Glucose-Capillary 223 (*)    All other components within normal limits  BASIC METABOLIC PANEL WITH GFR - Abnormal; Notable for the following components:   Chloride 97 (*)    CO2 34 (*)    Glucose, Bld 201 (*)    BUN 48 (*)    Creatinine, Ser 1.14 (*)    Calcium  8.6 (*)    GFR, Estimated 49 (*)    All other components within normal limits  CBC - Abnormal; Notable for the following components:   WBC 12.4 (*)    RBC 3.32 (*)    Hemoglobin 9.2 (*)    HCT 29.7 (*)    RDW 15.9 (*)    All other components within normal limits  GLUCOSE, CAPILLARY - Abnormal; Notable for the following components:   Glucose-Capillary 236 (*)    All other components within normal  limits  GLUCOSE, CAPILLARY - Abnormal; Notable for the following components:   Glucose-Capillary 223 (*)    All other components within normal limits  GLUCOSE, CAPILLARY - Abnormal; Notable for the following components:   Glucose-Capillary 214 (*)    All other components within normal limits  GLUCOSE, CAPILLARY - Abnormal; Notable for the following components:   Glucose-Capillary 238 (*)    All other components within normal limits  GLUCOSE, CAPILLARY - Abnormal; Notable for the following components:   Glucose-Capillary 167 (*)    All other components within normal limits  GLUCOSE, CAPILLARY - Abnormal; Notable for the following components:   Glucose-Capillary 193 (*)    All other components within normal limits  GLUCOSE, CAPILLARY - Abnormal; Notable for the following components:   Glucose-Capillary 177 (*)    All other components within normal limits  GLUCOSE, CAPILLARY - Abnormal; Notable for the following components:   Glucose-Capillary 203 (*)    All other components within normal limits  GLUCOSE, CAPILLARY - Abnormal; Notable for the following components:   Glucose-Capillary 159 (*)    All other components within normal limits  GLUCOSE, CAPILLARY - Abnormal; Notable for the following components:   Glucose-Capillary 164 (*)    All other components within normal limits  BASIC METABOLIC PANEL WITH GFR - Abnormal; Notable for the following components:   Potassium 2.9 (*)    Chloride 95 (*)    CO2 38 (*)    Glucose, Bld 184 (*)    BUN 54 (*)    Creatinine, Ser 1.21 (*)    Calcium  8.6 (*)    GFR, Estimated 45 (*)    All other components within normal limits  GLUCOSE, CAPILLARY - Abnormal; Notable for the following components:   Glucose-Capillary 163 (*)    All other components within normal limits  BASIC METABOLIC PANEL WITH GFR - Abnormal; Notable for the following components:   Potassium 3.2 (*)    Chloride 95 (*)    CO2 37 (*)    Glucose, Bld 212 (*)    BUN 59 (*)     Creatinine, Ser 1.25 (*)    Calcium  8.8 (*)    GFR, Estimated 44 (*)    All other components within normal limits  GLUCOSE, CAPILLARY - Abnormal; Notable for the following components:   Glucose-Capillary 197 (*)    All other components within normal limits  GLUCOSE, CAPILLARY - Abnormal; Notable for the following components:   Glucose-Capillary 224 (*)    All other components within normal limits  TRIGLYCERIDES - Abnormal; Notable for the following components:   Triglycerides 770 (*)    All other components within normal limits  CBC - Abnormal; Notable for the following components:   WBC 12.5 (*)    RBC 2.93 (*)    Hemoglobin 8.4 (*)    HCT 26.8 (*)    RDW 16.0 (*)    All other components within normal limits  BASIC METABOLIC PANEL WITH GFR - Abnormal; Notable for the following components:   Potassium 2.7 (*)    Chloride 88 (*)    CO2 39 (*)    Glucose, Bld 197 (*)    BUN 63 (*)    Creatinine, Ser 1.14 (*)    GFR, Estimated 49 (*)    All other components within normal limits  GLUCOSE, CAPILLARY - Abnormal; Notable for the following components:   Glucose-Capillary 170 (*)    All other components within normal limits  GLUCOSE, CAPILLARY - Abnormal; Notable for the following components:   Glucose-Capillary 142 (*)    All other components within normal limits  GLUCOSE, CAPILLARY - Abnormal; Notable for the following components:   Glucose-Capillary 208 (*)    All other components within normal limits  GLUCOSE, CAPILLARY - Abnormal; Notable for the following components:   Glucose-Capillary 161 (*)    All other components within normal limits  GLUCOSE, CAPILLARY - Abnormal; Notable for the following components:   Glucose-Capillary 223 (*)    All other components within normal limits  I-STAT CG4 LACTIC ACID, ED - Abnormal; Notable for the following components:   Lactic Acid, Venous 4.1 (*)    All other components within normal limits  CBG MONITORING, ED - Abnormal; Notable for  the following components:   Glucose-Capillary 230 (*)    All other components within normal limits  I-STAT VENOUS BLOOD GAS, ED - Abnormal; Notable for the following components:   pH, Ven 7.514 (*)    pCO2, Ven 34.2 (*)  pO2, Ven 70 (*)    Acid-Base Excess 5.0 (*)    Potassium 3.3 (*)    Calcium , Ion 1.07 (*)    All other components within normal limits  I-STAT CG4 LACTIC ACID, ED - Abnormal; Notable for the following components:   Lactic Acid, Venous 2.6 (*)    All other components within normal limits  CBG MONITORING, ED - Abnormal; Notable for the following components:   Glucose-Capillary 208 (*)    All other components within normal limits  TROPONIN I (HIGH SENSITIVITY) - Abnormal; Notable for the following components:   Troponin I (High Sensitivity) 337 (*)    All other components within normal limits  TROPONIN I (HIGH SENSITIVITY) - Abnormal; Notable for the following components:   Troponin I (High Sensitivity) 390 (*)    All other components within normal limits  TROPONIN I (HIGH SENSITIVITY) - Abnormal; Notable for the following components:   Troponin I (High Sensitivity) 468 (*)    All other components within normal limits  TROPONIN I (HIGH SENSITIVITY) - Abnormal; Notable for the following components:   Troponin I (High Sensitivity) 391 (*)    All other components within normal limits  RESP PANEL BY RT-PCR (RSV, FLU A&B, COVID)  RVPGX2  MRSA NEXT GEN BY PCR, NASAL  CULTURE, BLOOD (ROUTINE X 2)  CULTURE, BLOOD (ROUTINE X 2)  CSF CULTURE W GRAM STAIN  VZV PCR, CSF  CULTURE, RESPIRATORY W GRAM STAIN  MRSA NEXT GEN BY PCR, NASAL  PROTIME-INR  AMMONIA  TSH  ETHANOL  LACTIC ACID, PLASMA  MENINGITIS/ENCEPHALITIS PANEL (CSF)  VDRL, CSF  MAGNESIUM  MAGNESIUM  PHOSPHORUS  PHOSPHORUS  LUPUS ANTICOAGULANT  CARDIOLIPIN ANTIBODIES, IGG, IGM, IGA  BETA-2-GLYCOPROTEIN I ABS, IGG/M/A  TRIGLYCERIDES  MAGNESIUM  PHOSPHORUS  CK  MAGNESIUM  PHOSPHORUS  MAGNESIUM   TRIGLYCERIDES  MAGNESIUM  PROCALCITONIN  PHOSPHORUS  MAGNESIUM  DRAW EXTRA CLOT TUBE  BASIC METABOLIC PANEL WITH GFR  TRIGLYCERIDES  CYTOLOGY - NON PAP    EKG: EKG Interpretation Date/Time:  Monday February 27 2024 23:44:49 EDT Ventricular Rate:  114 PR Interval:  168 QRS Duration:  95 QT Interval:  322 QTC Calculation: 444 R Axis:   -4  Text Interpretation: Sinus tachycardia Anterior infarct, old When compared with ECG of 01/25/2021, HEART RATE has increased Confirmed by Raford Lenis ([id number]) on 02/28/2024 2:28:36 AM  Radiology: No results found.    .Critical Care  Performed by: Elnor Bernarda SQUIBB, DO Authorized by: Elnor Bernarda SQUIBB, DO   Critical care provider statement:    Critical care time (minutes):  105   Critical care was necessary to treat or prevent imminent or life-threatening deterioration of the following conditions:  Sepsis   Critical care was time spent personally by me on the following activities:  Development of treatment plan with patient or surrogate, discussions with consultants, evaluation of patient's response to treatment, examination of patient, ordering and review of laboratory studies, ordering and review of radiographic studies, ordering and performing treatments and interventions, pulse oximetry, re-evaluation of patient's condition and review of old charts   Care discussed with comment:  Oncoming ED provider    Medications Ordered in the ED  lactated ringers  infusion ( Intravenous Stopped 02/28/24 1815)  polyethylene glycol (MIRALAX / GLYCOLAX) packet 17 g (0 g Per Tube Duplicate 03/03/24 0911)  insulin  aspart (novoLOG ) injection 0-15 Units (3 Units Subcutaneous Given 03/09/24 0755)  aspirin  chewable tablet 81 mg (81 mg Per Tube Given 03/09/24 0923)  arformoterol  (BROVANA ) nebulizer  solution 15 mcg (15 mcg Nebulization Given 03/09/24 0707)  revefenacin  (YUPELRI ) nebulizer solution 175 mcg (175 mcg Nebulization Given 03/09/24 0708)  budesonide   (PULMICORT ) nebulizer solution 0.5 mg (0.5 mg Nebulization Given 03/09/24 0709)  ipratropium-albuterol  (DUONEB) 0.5-2.5 (3) MG/3ML nebulizer solution 3 mL (3 mLs Nebulization Given 03/02/24 0350)  roflumilast  (DALIRESP ) tablet 500 mcg (500 mcg Per Tube Given 03/09/24 0922)  pantoprazole (PROTONIX) injection 40 mg (40 mg Intravenous Given 03/09/24 0843)  Oral care mouth rinse (15 mLs Mouth Rinse Given 03/09/24 1114)  Oral care mouth rinse (has no administration in time range)  0.9 %  sodium chloride  infusion ( Intravenous Stopped 02/29/24 0015)  lactated ringers  infusion ( Intravenous Stopped 03/01/24 0601)  enoxaparin  (LOVENOX ) injection 40 mg (40 mg Subcutaneous Given 03/09/24 0843)  feeding supplement (OSMOLITE 1.5 CAL) liquid 1,000 mL ( Per Tube Infusion Verify 03/09/24 0700)  fentaNYL  in NS 250mL (10mcg/ml) infusion-PREMIX (0 mcg/hr Intravenous Stopped 03/04/24 0700)  fentaNYL  (SUBLIMAZE ) bolus via infusion 25-100 mcg (50 mcg Intravenous Bolus from Bag 03/09/24 1037)  propofol  (DIPRIVAN ) 1000 MG/100ML infusion (0 mcg/kg/min  98.1 kg Intravenous Stopped 03/06/24 1249)  thiamine  (VITAMIN B1) tablet 100 mg (100 mg Per Tube Given 03/09/24 0922)  oxyCODONE (Oxy IR/ROXICODONE) immediate release tablet 10 mg (10 mg Per Tube Given 03/09/24 0922)  labetalol (NORMODYNE) injection 5 mg (5 mg Intravenous Given 03/09/24 1112)  fentaNYL  in NS (53mcg/ml) infusion-PREMIX (175 mcg/hr Intravenous Infusion Verify 03/09/24 0700)  acetaminophen  (TYLENOL ) 160 MG/5ML solution 650 mg (650 mg Per Tube Given 03/09/24 1041)  sodium chloride  flush (NS) 0.9 % injection 10-40 mL (10 mLs Intracatheter Given 03/09/24 0926)  sodium chloride  flush (NS) 0.9 % injection 10-40 mL (has no administration in time range)  polyethylene glycol (MIRALAX / GLYCOLAX) packet 17 g (17 g Per Tube Given 03/09/24 0923)  Chlorhexidine  Gluconate Cloth 2 % PADS 6 each (6 each Topical Given 03/08/24 2248)  QUEtiapine  (SEROQUEL) tablet 25 mg (25 mg Per Tube Given 03/09/24 0922)  methocarbamol  (ROBAXIN ) tablet 500 mg (500 mg Per Tube Given 03/09/24 0923)  senna (SENOKOT) tablet 8.6 mg (has no administration in time range)  feeding supplement (PROSource TF20) liquid 60 mL (60 mLs Per Tube Given 03/09/24 0922)  EPINEPHrine  (EPI-PEN) injection 0.3 mg (has no administration in time range)  diphenhydrAMINE (BENADRYL) injection 25 mg (has no administration in time range)  propofol  (DIPRIVAN ) 1000 MG/100ML infusion (10 mcg/kg/min  98.1 kg Intravenous New Bag/Given 03/09/24 0909)  furosemide  (LASIX ) 160 mg in dextrose  5 % 50 mL IVPB (0 mg Intravenous Stopped 03/09/24 0638)  white petrolatum (VASELINE) gel (0.2 Applications Topical Given 03/08/24 1544)  hydrALAZINE  (APRESOLINE ) injection 5-10 mg (10 mg Intravenous Given 03/09/24 1037)  dexmedetomidine (PRECEDEX) 400 MCG/100ML (4 mcg/mL) infusion (0.4 mcg/kg/hr  99.3 kg Intravenous New Bag/Given 03/09/24 0807)  acetaZOLAMIDE (DIAMOX) injection 500 mg (500 mg Intravenous Given 03/09/24 0923)  insulin  glargine-yfgn (SEMGLEE) injection 30 Units (30 Units Subcutaneous Given 03/09/24 0926)  insulin  aspart (novoLOG ) injection 5 Units (has no administration in time range)  potassium chloride  (KLOR-CON ) packet 40 mEq (40 mEq Per Tube Given 03/09/24 0922)  nafcillin 12 g in sodium chloride  0.9 % 500 mL continuous infusion (has no administration in time range)  penicillin  G potassium 18 Million Units in dextrose  5 % 500 mL CONTINUOUS infusion (has no administration in time range)  lactated ringers  bolus 1,000 mL (0 mLs Intravenous Stopped 02/28/24 0100)  ketorolac  (TORADOL ) 15 MG/ML injection 15 mg (15 mg Intravenous Given 02/27/24 2256)  sodium chloride  0.9 % bolus 1,000 mL (0 mLs Intravenous Stopped 02/28/24 0003)  ceFEPIme (MAXIPIME) 2 g in sodium chloride  0.9 % 100 mL IVPB (0 g Intravenous Stopped 02/28/24 0016)  metroNIDAZOLE (FLAGYL) IVPB 500 mg (0 mg Intravenous Stopped  02/28/24 0100)  vancomycin (VANCOCIN) IVPB 1000 mg/200 mL premix (0 mg Intravenous Stopped 02/28/24 0100)  LORazepam (ATIVAN) injection 1 mg (1 mg Intravenous Given 02/27/24 2250)  ziprasidone (GEODON) injection 20 mg (20 mg Intramuscular Given 02/27/24 2316)  acetaminophen  (OFIRMEV ) IV 1,000 mg (0 mg Intravenous Stopped 02/28/24 1821)  iohexol  (OMNIPAQUE ) 350 MG/ML injection 75 mL (75 mLs Intravenous Contrast Given 02/28/24 0127)  propofol  (DIPRIVAN ) bolus via infusion 40 mg (40 mg Intravenous Bolus from Bag 02/28/24 0303)  fentaNYL  (SUBLIMAZE ) injection 100 mcg (100 mcg Intravenous Given 02/28/24 0340)  gadobutrol (GADAVIST) 1 MMOL/ML injection 8 mL (8 mLs Intravenous Contrast Given 02/29/24 0002)  potassium chloride  (KLOR-CON ) packet 40 mEq (40 mEq Per Tube Given 02/29/24 0609)  magnesium sulfate IVPB 4 g 100 mL (0 g Intravenous Stopped 02/29/24 0806)  lactated ringers  bolus 500 mL ( Intravenous Infusion Verify 03/01/24 0800)  iohexol  (OMNIPAQUE ) 350 MG/ML injection 75 mL (75 mLs Intravenous Contrast Given 03/01/24 1102)  furosemide  (LASIX ) injection 40 mg (40 mg Intravenous Given 03/02/24 1120)   stroke: early stages of recovery book ( Does not apply Given 03/02/24 1219)  hydrALAZINE  (APRESOLINE ) injection 5-10 mg (10 mg Intravenous Given 03/08/24 1101)  furosemide  (LASIX ) injection 80 mg (80 mg Intravenous Given 03/03/24 1744)  furosemide  (LASIX ) 160 mg in dextrose  5 % 50 mL IVPB (0 mg Intravenous Stopped 03/04/24 1218)  bisacodyl (DULCOLAX) suppository 10 mg (10 mg Rectal Given 03/05/24 0913)  furosemide  (LASIX ) 160 mg in dextrose  5 % 50 mL IVPB (0 mg Intravenous Stopping previously hung infusion 03/06/24 0658)  metolazone (ZAROXOLYN) tablet 5 mg (5 mg Per Tube Given 03/05/24 1024)  albumin human 25 % solution 12.5 g (0 g Intravenous Stopped 03/05/24 1211)  amoxicillin  (AMOXIL ) 250 MG/5ML suspension 500 mg (500 mg Per Tube Given 03/05/24 1554)  furosemide  (LASIX ) 160 mg in dextrose  5 %  50 mL IVPB (0 mg Intravenous Stopped 03/05/24 1924)  potassium chloride  (KLOR-CON ) packet 40 mEq (40 mEq Per Tube Given 03/06/24 0548)  furosemide  (LASIX ) 160 mg in dextrose  5 % 50 mL IVPB (0 mg Intravenous Stopped 03/07/24 0248)  metolazone (ZAROXOLYN) tablet 5 mg (5 mg Per Tube Given 03/06/24 0956)  potassium chloride  10 mEq in 100 mL IVPB (0 mEq Intravenous Stopped 03/07/24 0018)  furosemide  (LASIX ) 160 mg in dextrose  5 % 50 mL IVPB (0 mg Intravenous Stopped 03/07/24 1747)  metolazone (ZAROXOLYN) tablet 5 mg (5 mg Per Tube Given 03/07/24 1101)  vancomycin (VANCOREADY) IVPB 2000 mg/400 mL (0 mg Intravenous Stopped 03/07/24 1317)  metolazone (ZAROXOLYN) tablet 5 mg (5 mg Per Tube Given 03/08/24 0648)  potassium chloride  (KLOR-CON ) packet 40 mEq (40 mEq Per Tube Given 03/08/24 1147)  albumin human 25 % solution 25 g (0 g Intravenous Stopped 03/09/24 0501)  acetaZOLAMIDE (DIAMOX) injection 500 mg (500 mg Intravenous Given 03/08/24 1054)  sterile water (preservative free) injection (10 mLs  Given 03/08/24 1055)  insulin  glargine-yfgn (SEMGLEE) injection 5 Units (5 Units Subcutaneous Given 03/08/24 1219)  sterile water (preservative free) injection (10 mLs  Given 03/09/24 0923)  Medical Decision Making Amount and/or Complexity of Data Reviewed Labs: ordered. Radiology: ordered.  Risk Prescription drug management. Decision regarding hospitalization.    78 yo female presenting after being found down in her home after a neighbor called EMS when unable to reach her. Pt was found febrile, tachycardic, tachypneic, confused, and unaware that she had been down or for how long. On my assessment she has a GCS of 10. She does not follow commands and is pulling at lines and staff members. Toradol  given for fever. IV fluids started. High suspicion for sepsis. Blood cx and la with labs drawn and sent.   High concern for UTI-rocephin started.  -CXR  non-diagnostic -UA clean. Antibiotics escalated to broad spectrum.  -CT chest, abd, pelvis pending and signed out to oncoming phsycian -Team unable to get functioning IV for CT scans. Pt unable to hold still for IV. Confused and grabbing at myself and nursing staff. Ativan given.  I performed a left basilic peripheral IV with US  guidance with no acute complications. -Pt confused and unable to hold still for CT. Geodon given.  -Blood cx pending -LA elevated.   During sign out, CT abd/chest/pelvis pending. Hopes to identify source of infection. Blood cx pending. Recs to admit for delirium and fever/sepsis. Pt is tachypnic and tachycardic still. 2nd liter of fluids running. BP stable. No shock at this time. Monitor respiratory status. Hopefully will improve while we control fever and continue IVF. O2 sat 99-100% with a good wave form. No intubation at this time.       Final diagnoses:  Sepsis, due to unspecified organism, unspecified whether acute organ dysfunction present [facility])  Fall, initial encounter  Fever, unspecified fever cause  Delirium    ED Discharge Orders          Ordered    Home infusion instructions - [facility]        Pending    Method of administration may be changed at the discretion of home infusion pharmacist based upon assessment of the patient and/or caregiver's ability to self-administer the medication ordered        Pending    Anaphylaxis Kit: Provided to treat any anaphylactic reaction to the medication being provided to the patient if First Dose or when requested by physician        Pending    Change dressing on IV access line weekly and PRN        Pending    Flush IV access with Sodium Chloride  0.9% and Heparin  10 units/ml or 100 units/ml        Pending    [facility] to provide Cath Flo 2mg        Comments: Administer for PICC line occlusion and as ordered by physician for other access device issues.   Pending    [facility] pharmacist to adjust dose for Vancomycin, Aminoglycosides and other anti-infective therapies as requested by physician.        Pending    penicillin  G IVPB  Every 24 hours        Pending               Elnor Bernarda SQUIBB, DO 03/09/24 1329

## 2024-02-27 NOTE — Sepsis Progress Note (Signed)
 Following for sepsis monitoring ?

## 2024-02-27 NOTE — ED Triage Notes (Signed)
 Pt LKW by neighbors was 2 days ago. Called for wellness check. Fire and ems broke down door at home, found pt face down covered in urine. Confused. Pulling at cords. Pt has hx of emphysema wears O2 2L at base. Pt currently confused and uncooperative.

## 2024-02-28 ENCOUNTER — Emergency Department (HOSPITAL_COMMUNITY)

## 2024-02-28 ENCOUNTER — Inpatient Hospital Stay (HOSPITAL_COMMUNITY)

## 2024-02-28 ENCOUNTER — Encounter (HOSPITAL_COMMUNITY): Payer: Self-pay

## 2024-02-28 ENCOUNTER — Other Ambulatory Visit: Payer: Self-pay

## 2024-02-28 DIAGNOSIS — I5033 Acute on chronic diastolic (congestive) heart failure: Secondary | ICD-10-CM | POA: Diagnosis present

## 2024-02-28 DIAGNOSIS — G928 Other toxic encephalopathy: Secondary | ICD-10-CM | POA: Diagnosis present

## 2024-02-28 DIAGNOSIS — E119 Type 2 diabetes mellitus without complications: Secondary | ICD-10-CM | POA: Diagnosis not present

## 2024-02-28 DIAGNOSIS — M7989 Other specified soft tissue disorders: Secondary | ICD-10-CM | POA: Diagnosis not present

## 2024-02-28 DIAGNOSIS — J9601 Acute respiratory failure with hypoxia: Secondary | ICD-10-CM | POA: Diagnosis not present

## 2024-02-28 DIAGNOSIS — E669 Obesity, unspecified: Secondary | ICD-10-CM | POA: Diagnosis present

## 2024-02-28 DIAGNOSIS — I4891 Unspecified atrial fibrillation: Secondary | ICD-10-CM | POA: Diagnosis not present

## 2024-02-28 DIAGNOSIS — R7881 Bacteremia: Secondary | ICD-10-CM | POA: Diagnosis not present

## 2024-02-28 DIAGNOSIS — J9600 Acute respiratory failure, unspecified whether with hypoxia or hypercapnia: Secondary | ICD-10-CM

## 2024-02-28 DIAGNOSIS — T827XXA Infection and inflammatory reaction due to other cardiac and vascular devices, implants and grafts, initial encounter: Secondary | ICD-10-CM | POA: Diagnosis not present

## 2024-02-28 DIAGNOSIS — R569 Unspecified convulsions: Secondary | ICD-10-CM

## 2024-02-28 DIAGNOSIS — R4182 Altered mental status, unspecified: Secondary | ICD-10-CM | POA: Diagnosis not present

## 2024-02-28 DIAGNOSIS — I6389 Other cerebral infarction: Secondary | ICD-10-CM | POA: Diagnosis not present

## 2024-02-28 DIAGNOSIS — B955 Unspecified streptococcus as the cause of diseases classified elsewhere: Secondary | ICD-10-CM | POA: Diagnosis not present

## 2024-02-28 DIAGNOSIS — Z515 Encounter for palliative care: Secondary | ICD-10-CM | POA: Diagnosis not present

## 2024-02-28 DIAGNOSIS — Y831 Surgical operation with implant of artificial internal device as the cause of abnormal reaction of the patient, or of later complication, without mention of misadventure at the time of the procedure: Secondary | ICD-10-CM | POA: Diagnosis not present

## 2024-02-28 DIAGNOSIS — G9341 Metabolic encephalopathy: Secondary | ICD-10-CM | POA: Diagnosis not present

## 2024-02-28 DIAGNOSIS — M6282 Rhabdomyolysis: Secondary | ICD-10-CM | POA: Diagnosis not present

## 2024-02-28 DIAGNOSIS — Y95 Nosocomial condition: Secondary | ICD-10-CM | POA: Diagnosis not present

## 2024-02-28 DIAGNOSIS — I130 Hypertensive heart and chronic kidney disease with heart failure and stage 1 through stage 4 chronic kidney disease, or unspecified chronic kidney disease: Secondary | ICD-10-CM | POA: Diagnosis present

## 2024-02-28 DIAGNOSIS — K279 Peptic ulcer, site unspecified, unspecified as acute or chronic, without hemorrhage or perforation: Secondary | ICD-10-CM | POA: Diagnosis present

## 2024-02-28 DIAGNOSIS — J15211 Pneumonia due to Methicillin susceptible Staphylococcus aureus: Secondary | ICD-10-CM | POA: Diagnosis not present

## 2024-02-28 DIAGNOSIS — I639 Cerebral infarction, unspecified: Secondary | ICD-10-CM | POA: Diagnosis not present

## 2024-02-28 DIAGNOSIS — Z1152 Encounter for screening for COVID-19: Secondary | ICD-10-CM | POA: Diagnosis not present

## 2024-02-28 DIAGNOSIS — R29732 NIHSS score 32: Secondary | ICD-10-CM | POA: Diagnosis not present

## 2024-02-28 DIAGNOSIS — N1831 Chronic kidney disease, stage 3a: Secondary | ICD-10-CM | POA: Diagnosis present

## 2024-02-28 DIAGNOSIS — A419 Sepsis, unspecified organism: Secondary | ICD-10-CM

## 2024-02-28 DIAGNOSIS — B950 Streptococcus, group A, as the cause of diseases classified elsewhere: Secondary | ICD-10-CM | POA: Diagnosis not present

## 2024-02-28 DIAGNOSIS — I330 Acute and subacute infective endocarditis: Secondary | ICD-10-CM | POA: Diagnosis present

## 2024-02-28 DIAGNOSIS — N179 Acute kidney failure, unspecified: Secondary | ICD-10-CM | POA: Diagnosis present

## 2024-02-28 DIAGNOSIS — E1122 Type 2 diabetes mellitus with diabetic chronic kidney disease: Secondary | ICD-10-CM | POA: Diagnosis present

## 2024-02-28 DIAGNOSIS — Z9981 Dependence on supplemental oxygen: Secondary | ICD-10-CM | POA: Diagnosis not present

## 2024-02-28 DIAGNOSIS — Z880 Allergy status to penicillin: Secondary | ICD-10-CM | POA: Diagnosis not present

## 2024-02-28 DIAGNOSIS — I63411 Cerebral infarction due to embolism of right middle cerebral artery: Secondary | ICD-10-CM | POA: Diagnosis present

## 2024-02-28 DIAGNOSIS — I6340 Cerebral infarction due to embolism of unspecified cerebral artery: Secondary | ICD-10-CM | POA: Diagnosis not present

## 2024-02-28 DIAGNOSIS — R6520 Severe sepsis without septic shock: Secondary | ICD-10-CM | POA: Diagnosis present

## 2024-02-28 DIAGNOSIS — I76 Septic arterial embolism: Secondary | ICD-10-CM | POA: Diagnosis not present

## 2024-02-28 DIAGNOSIS — I38 Endocarditis, valve unspecified: Secondary | ICD-10-CM | POA: Diagnosis not present

## 2024-02-28 DIAGNOSIS — G9340 Encephalopathy, unspecified: Secondary | ICD-10-CM | POA: Diagnosis not present

## 2024-02-28 DIAGNOSIS — J439 Emphysema, unspecified: Secondary | ICD-10-CM | POA: Diagnosis present

## 2024-02-28 DIAGNOSIS — A409 Streptococcal sepsis, unspecified: Secondary | ICD-10-CM | POA: Diagnosis present

## 2024-02-28 DIAGNOSIS — R29722 NIHSS score 22: Secondary | ICD-10-CM | POA: Diagnosis not present

## 2024-02-28 DIAGNOSIS — R410 Disorientation, unspecified: Secondary | ICD-10-CM | POA: Diagnosis present

## 2024-02-28 DIAGNOSIS — Z66 Do not resuscitate: Secondary | ICD-10-CM | POA: Diagnosis not present

## 2024-02-28 LAB — CBC
HCT: 32.6 % — ABNORMAL LOW (ref 36.0–46.0)
Hemoglobin: 10.3 g/dL — ABNORMAL LOW (ref 12.0–15.0)
MCH: 27.6 pg (ref 26.0–34.0)
MCHC: 31.6 g/dL (ref 30.0–36.0)
MCV: 87.4 fL (ref 80.0–100.0)
Platelets: 165 K/uL (ref 150–400)
RBC: 3.73 MIL/uL — ABNORMAL LOW (ref 3.87–5.11)
RDW: 14.8 % (ref 11.5–15.5)
WBC: 12.3 K/uL — ABNORMAL HIGH (ref 4.0–10.5)
nRBC: 0 % (ref 0.0–0.2)

## 2024-02-28 LAB — ETHANOL: Alcohol, Ethyl (B): <15 mg/dL (ref <15)

## 2024-02-28 LAB — BLOOD CULTURE ID PANEL (REFLEXED) - BCID2

## 2024-02-28 LAB — GLUCOSE, CAPILLARY
Glucose-Capillary: 183 mg/dL — ABNORMAL HIGH (ref 70–99)
Glucose-Capillary: 152 mg/dL — ABNORMAL HIGH (ref 70–99)
Glucose-Capillary: 128 mg/dL — ABNORMAL HIGH (ref 70–99)
Glucose-Capillary: 106 mg/dL — ABNORMAL HIGH (ref 70–99)

## 2024-02-28 LAB — HEMOGLOBIN A1C
Hgb A1c MFr Bld: 7 % — ABNORMAL HIGH (ref 4.8–5.6)
Mean Plasma Glucose: 154.2 mg/dL

## 2024-02-28 LAB — RESP PANEL BY RT-PCR (RSV, FLU A&B, COVID)  RVPGX2
Influenza A by PCR: NEGATIVE
Influenza B by PCR: NEGATIVE
Resp Syncytial Virus by PCR: NEGATIVE
SARS Coronavirus 2 by RT PCR: NEGATIVE

## 2024-02-28 LAB — MAGNESIUM: Magnesium: 1.5 mg/dL — ABNORMAL LOW (ref 1.7–2.4)

## 2024-02-28 LAB — TROPONIN I (HIGH SENSITIVITY)
Troponin I (High Sensitivity): 390 ng/L (ref <18)
Troponin I (High Sensitivity): 468 ng/L (ref <18)
Troponin I (High Sensitivity): 391 ng/L (ref <18)

## 2024-02-28 LAB — AMMONIA: Ammonia: 29 umol/L (ref 9–35)

## 2024-02-28 LAB — TSH: TSH: 2.078 u[IU]/mL (ref 0.350–4.500)

## 2024-02-28 LAB — CREATININE, SERUM
Creatinine, Ser: 1.18 mg/dL — ABNORMAL HIGH (ref 0.44–1.00)
GFR, Estimated: 47 mL/min — ABNORMAL LOW (ref >60)

## 2024-02-28 LAB — MRSA NEXT GEN BY PCR, NASAL: MRSA by PCR Next Gen: NOT DETECTED

## 2024-02-28 LAB — CBG MONITORING, ED: Glucose-Capillary: 208 mg/dL — ABNORMAL HIGH (ref 70–99)

## 2024-02-28 MED ORDER — IOHEXOL 350 MG/ML SOLN
75.0000 mL | Freq: Once | INTRAVENOUS | Status: AC | PRN
  Administered 2024-02-28: 75 mL via INTRAVENOUS

## 2024-02-28 MED ORDER — SUCCINYLCHOLINE CHLORIDE 200 MG/10ML IV SOSY
100.0000 mg | PREFILLED_SYRINGE | Freq: Once | INTRAVENOUS | Status: DC
Start: 2024-02-28 — End: 2024-02-28
  Filled 2024-02-28: qty 10

## 2024-02-28 MED ORDER — INSULIN ASPART 100 UNIT/ML IJ SOLN
0.0000 [IU] | INTRAMUSCULAR | Status: DC
  Administered 2024-02-28: 5 [IU] via SUBCUTANEOUS
  Administered 2024-02-28: 3 [IU] via SUBCUTANEOUS
  Administered 2024-02-28: 2 [IU] via SUBCUTANEOUS
  Administered 2024-02-28: 5 [IU] via SUBCUTANEOUS
  Administered 2024-02-29 (×5): 2 [IU] via SUBCUTANEOUS
  Administered 2024-03-01: 3 [IU] via SUBCUTANEOUS
  Administered 2024-03-01: 2 [IU] via SUBCUTANEOUS
  Administered 2024-03-01 (×3): 3 [IU] via SUBCUTANEOUS
  Administered 2024-03-02 (×2): 2 [IU] via SUBCUTANEOUS
  Administered 2024-03-02: 3 [IU] via SUBCUTANEOUS
  Administered 2024-03-02: 2 [IU] via SUBCUTANEOUS
  Administered 2024-03-02: 3 [IU] via SUBCUTANEOUS
  Administered 2024-03-02: 2 [IU] via SUBCUTANEOUS
  Administered 2024-03-03 (×3): 3 [IU] via SUBCUTANEOUS
  Administered 2024-03-03: 2 [IU] via SUBCUTANEOUS
  Administered 2024-03-03 – 2024-03-04 (×6): 3 [IU] via SUBCUTANEOUS
  Administered 2024-03-04: 2 [IU] via SUBCUTANEOUS
  Administered 2024-03-04 – 2024-03-06 (×7): 3 [IU] via SUBCUTANEOUS
  Administered 2024-03-06 (×2): 5 [IU] via SUBCUTANEOUS
  Administered 2024-03-06: 3 [IU] via SUBCUTANEOUS
  Administered 2024-03-06: 5 [IU] via SUBCUTANEOUS
  Administered 2024-03-06 (×2): 3 [IU] via SUBCUTANEOUS
  Administered 2024-03-07: 5 [IU] via SUBCUTANEOUS
  Administered 2024-03-07 (×2): 3 [IU] via SUBCUTANEOUS
  Administered 2024-03-07: 5 [IU] via SUBCUTANEOUS
  Administered 2024-03-07 – 2024-03-08 (×2): 3 [IU] via SUBCUTANEOUS
  Administered 2024-03-08: 2 [IU] via SUBCUTANEOUS
  Administered 2024-03-08 (×2): 3 [IU] via SUBCUTANEOUS
  Administered 2024-03-08: 5 [IU] via SUBCUTANEOUS
  Administered 2024-03-08 – 2024-03-09 (×4): 3 [IU] via SUBCUTANEOUS
  Administered 2024-03-09: 5 [IU] via SUBCUTANEOUS
  Administered 2024-03-09: 3 [IU] via SUBCUTANEOUS
  Administered 2024-03-09 (×2): 5 [IU] via SUBCUTANEOUS
  Administered 2024-03-10: 2 [IU] via SUBCUTANEOUS
  Administered 2024-03-10: 5 [IU] via SUBCUTANEOUS
  Administered 2024-03-10: 3 [IU] via SUBCUTANEOUS
  Administered 2024-03-10: 5 [IU] via SUBCUTANEOUS
  Administered 2024-03-10: 3 [IU] via SUBCUTANEOUS
  Administered 2024-03-10: 2 [IU] via SUBCUTANEOUS
  Administered 2024-03-11: 5 [IU] via SUBCUTANEOUS
  Administered 2024-03-11: 3 [IU] via SUBCUTANEOUS

## 2024-02-28 MED ORDER — PROPOFOL 1000 MG/100ML IV EMUL
0.0000 ug/kg/min | INTRAVENOUS | Status: DC
  Filled 2024-02-28: qty 100

## 2024-02-28 MED ORDER — SUCCINYLCHOLINE CHLORIDE 20 MG/ML IJ SOLN
INTRAMUSCULAR | Status: DC | PRN
  Administered 2024-02-28: 100 mg via INTRAVENOUS

## 2024-02-28 MED ORDER — PROPOFOL BOLUS VIA INFUSION
40.0000 mg | Freq: Once | INTRAVENOUS | Status: AC
  Administered 2024-02-28: 40 mg via INTRAVENOUS
  Filled 2024-02-28: qty 40

## 2024-02-28 MED ORDER — ORAL CARE MOUTH RINSE
15.0000 mL | OROMUCOSAL | Status: DC
  Administered 2024-02-28 – 2024-03-11 (×145): 15 mL via OROMUCOSAL

## 2024-02-28 MED ORDER — GADOBUTROL 1 MMOL/ML IV SOLN
8.0000 mL | Freq: Once | INTRAVENOUS | Status: AC | PRN
  Administered 2024-02-29: 8 mL via INTRAVENOUS

## 2024-02-28 MED ORDER — FENTANYL 2500MCG IN NS 250ML (10MCG/ML) PREMIX INFUSION
0.0000 ug/h | INTRAVENOUS | Status: DC
  Administered 2024-02-28: 50 ug/h via INTRAVENOUS
  Filled 2024-02-28: qty 250

## 2024-02-28 MED ORDER — ACETAMINOPHEN 325 MG PO TABS: 650.0000 mg | ORAL_TABLET | ORAL | Status: DC | PRN

## 2024-02-28 MED ORDER — REVEFENACIN 175 MCG/3ML IN SOLN
175.0000 ug | Freq: Every day | RESPIRATORY_TRACT | Status: DC
  Administered 2024-02-28 – 2024-03-11 (×13): 175 ug via RESPIRATORY_TRACT
  Filled 2024-02-28 (×13): qty 3

## 2024-02-28 MED ORDER — ORAL CARE MOUTH RINSE: 15.0000 mL | OROMUCOSAL | Status: DC | PRN

## 2024-02-28 MED ORDER — BUDESONIDE 0.5 MG/2ML IN SUSP
0.5000 mg | Freq: Two times a day (BID) | RESPIRATORY_TRACT | Status: DC
  Administered 2024-02-28 – 2024-03-11 (×25): 0.5 mg via RESPIRATORY_TRACT
  Filled 2024-02-28 (×25): qty 2

## 2024-02-28 MED ORDER — METRONIDAZOLE 500 MG/100ML IV SOLN
500.0000 mg | Freq: Two times a day (BID) | INTRAVENOUS | Status: DC
  Administered 2024-02-28: 500 mg via INTRAVENOUS
  Filled 2024-02-28: qty 100

## 2024-02-28 MED ORDER — ASPIRIN 81 MG PO CHEW
81.0000 mg | CHEWABLE_TABLET | Freq: Every day | ORAL | Status: DC
  Administered 2024-02-28 – 2024-03-10 (×12): 81 mg
  Filled 2024-02-28 (×12): qty 1

## 2024-02-28 MED ORDER — FENTANYL CITRATE (PF) 50 MCG/ML IJ SOSY
100.0000 ug | PREFILLED_SYRINGE | Freq: Once | INTRAMUSCULAR | Status: AC
  Administered 2024-02-28: 100 ug via INTRAVENOUS
  Filled 2024-02-28: qty 2

## 2024-02-28 MED ORDER — VANCOMYCIN HCL 750 MG/150ML IV SOLN
750.0000 mg | INTRAVENOUS | Status: DC
  Filled 2024-02-28: qty 150

## 2024-02-28 MED ORDER — ROFLUMILAST 500 MCG PO TABS
500.0000 ug | ORAL_TABLET | Freq: Every morning | ORAL | Status: DC
  Administered 2024-02-28 – 2024-03-11 (×13): 500 ug
  Filled 2024-02-28 (×13): qty 1

## 2024-02-28 MED ORDER — POLYETHYLENE GLYCOL 3350 17 G PO PACK
17.0000 g | PACK | Freq: Every day | ORAL | Status: AC
Start: 2024-02-28 — End: ?
  Administered 2024-02-28 – 2024-03-02 (×4): 17 g
  Filled 2024-02-28 (×5): qty 1

## 2024-02-28 MED ORDER — DOCUSATE SODIUM 50 MG/5ML PO LIQD
100.0000 mg | Freq: Two times a day (BID) | ORAL | Status: DC
  Administered 2024-02-28 – 2024-03-03 (×10): 100 mg
  Filled 2024-02-28 (×10): qty 10

## 2024-02-28 MED ORDER — DOCUSATE SODIUM 100 MG PO CAPS: 100.0000 mg | ORAL_CAPSULE | Freq: Two times a day (BID) | ORAL | Status: DC | PRN

## 2024-02-28 MED ORDER — ETOMIDATE 2 MG/ML IV SOLN
20.0000 mg | Freq: Once | INTRAVENOUS | Status: DC
  Filled 2024-02-28: qty 10

## 2024-02-28 MED ORDER — THIAMINE HCL 100 MG/ML IJ SOLN
100.0000 mg | Freq: Every day | INTRAMUSCULAR | Status: DC
  Administered 2024-02-28 – 2024-03-02 (×4): 100 mg via INTRAVENOUS
  Filled 2024-02-28 (×5): qty 2

## 2024-02-28 MED ORDER — FAMOTIDINE 20 MG PO TABS
20.0000 mg | ORAL_TABLET | Freq: Two times a day (BID) | ORAL | Status: DC
  Administered 2024-02-28: 20 mg
  Filled 2024-02-28: qty 1

## 2024-02-28 MED ORDER — PANTOPRAZOLE SODIUM 40 MG IV SOLR
40.0000 mg | INTRAVENOUS | Status: DC
  Administered 2024-02-28 – 2024-03-10 (×12): 40 mg via INTRAVENOUS
  Filled 2024-02-28 (×11): qty 10

## 2024-02-28 MED ORDER — DOCUSATE SODIUM 50 MG/5ML PO LIQD: 100.0000 mg | Freq: Two times a day (BID) | ORAL | Status: DC | PRN

## 2024-02-28 MED ORDER — SODIUM CHLORIDE 0.9 % IV SOLN
2.0000 g | Freq: Two times a day (BID) | INTRAVENOUS | Status: DC
  Administered 2024-02-28 – 2024-02-29 (×3): 2 g via INTRAVENOUS
  Filled 2024-02-28 (×3): qty 12.5

## 2024-02-28 MED ORDER — NOREPINEPHRINE 4 MG/250ML-% IV SOLN
0.0000 ug/min | INTRAVENOUS | Status: DC
  Administered 2024-02-28: 2 ug/min via INTRAVENOUS
  Administered 2024-02-29: 4 ug/min via INTRAVENOUS
  Filled 2024-02-28 (×2): qty 250

## 2024-02-28 MED ORDER — FENTANYL BOLUS VIA INFUSION
25.0000 ug | INTRAVENOUS | Status: DC | PRN
  Administered 2024-02-28 (×3): 100 ug via INTRAVENOUS
  Administered 2024-02-28: 50 ug via INTRAVENOUS
  Administered 2024-02-28 – 2024-03-01 (×10): 100 ug via INTRAVENOUS

## 2024-02-28 MED ORDER — ROSUVASTATIN CALCIUM 20 MG PO TABS
40.0000 mg | ORAL_TABLET | Freq: Every day | ORAL | Status: DC
  Administered 2024-02-28: 40 mg
  Filled 2024-02-28: qty 2

## 2024-02-28 MED ORDER — ENOXAPARIN SODIUM 40 MG/0.4ML IJ SOSY
40.0000 mg | PREFILLED_SYRINGE | INTRAMUSCULAR | Status: DC
  Administered 2024-02-28: 40 mg via SUBCUTANEOUS
  Filled 2024-02-28 (×2): qty 0.4

## 2024-02-28 MED ORDER — SODIUM CHLORIDE 0.9 % IV SOLN
250.0000 mL | INTRAVENOUS | Status: AC
  Administered 2024-02-28: 250 mL via INTRAVENOUS

## 2024-02-28 MED ORDER — PROPOFOL 1000 MG/100ML IV EMUL
0.0000 ug/kg/min | INTRAVENOUS | Status: DC
  Administered 2024-02-28 (×2): 20 ug/kg/min via INTRAVENOUS
  Administered 2024-02-29: 35 ug/kg/min via INTRAVENOUS
  Administered 2024-02-29 (×2): 40 ug/kg/min via INTRAVENOUS
  Administered 2024-02-29: 30 ug/kg/min via INTRAVENOUS
  Administered 2024-02-29: 50 ug/kg/min via INTRAVENOUS
  Administered 2024-03-01: 15 ug/kg/min via INTRAVENOUS
  Filled 2024-02-28 (×9): qty 100

## 2024-02-28 MED ORDER — POLYETHYLENE GLYCOL 3350 17 G PO PACK
17.0000 g | PACK | Freq: Every day | ORAL | Status: DC | PRN
Start: 2024-02-28 — End: 2024-03-11

## 2024-02-28 MED ORDER — IPRATROPIUM-ALBUTEROL 0.5-2.5 (3) MG/3ML IN SOLN
3.0000 mL | RESPIRATORY_TRACT | Status: DC | PRN
  Administered 2024-03-02: 3 mL via RESPIRATORY_TRACT

## 2024-02-28 MED ORDER — PROPOFOL 1000 MG/100ML IV EMUL
INTRAVENOUS | Status: DC | PRN
  Administered 2024-02-28: 5 ug/kg/min via INTRAVENOUS

## 2024-02-28 MED ORDER — ETOMIDATE 2 MG/ML IV SOLN
INTRAVENOUS | Status: DC | PRN
  Administered 2024-02-28: 20 mg via INTRAVENOUS

## 2024-02-28 MED ORDER — FENTANYL 2500MCG IN NS 250ML (10MCG/ML) PREMIX INFUSION
0.0000 ug/h | INTRAVENOUS | Status: DC
  Administered 2024-02-29: 100 ug/h via INTRAVENOUS
  Administered 2024-02-29: 75 ug/h via INTRAVENOUS
  Filled 2024-02-28 (×2): qty 250

## 2024-02-28 MED ORDER — ARFORMOTEROL TARTRATE 15 MCG/2ML IN NEBU
15.0000 ug | INHALATION_SOLUTION | Freq: Two times a day (BID) | RESPIRATORY_TRACT | Status: DC
  Administered 2024-02-28 – 2024-03-11 (×25): 15 ug via RESPIRATORY_TRACT
  Filled 2024-02-28 (×25): qty 2

## 2024-02-28 MED ORDER — CHLORHEXIDINE GLUCONATE CLOTH 2 % EX PADS
6.0000 | MEDICATED_PAD | Freq: Every day | CUTANEOUS | Status: DC
  Administered 2024-02-28 – 2024-03-03 (×6): 6 via TOPICAL

## 2024-02-28 NOTE — Progress Notes (Signed)
 PHARMACY - PHYSICIAN COMMUNICATION CRITICAL VALUE ALERT - BLOOD CULTURE IDENTIFICATION (BCID)  Victoria [name] is an 78 y.o. female who presented to [facility] on 02/27/2024 with a chief complaint of altered mental status.  Patient was started on broad spectrum antibiotics, Vancomycin, Cefepime, and Flagyl for rule out sepsis.  Assessment:  Strep species on BCID, source unknown at this time.    Name of physician (or Provider) Contacted: Alghanim MD  Current antibiotics: Vancomycin, Cefepime, and Flagyl  Changes to prescribed antibiotics recommended:  Recommend narrowing therapy to Rocephin 2gm IV q24h.  Recommendations declined by provider due to unclear source of infection.  Will discontinue Vancomycin and Flagyl, continue Cefepime.  May switch to Rocephin in the future.   Results for orders placed or performed during the hospital encounter of 02/27/24  Blood Culture ID Panel (Reflexed) (Collected: 02/27/2024  9:18 PM)  Result Value Ref Range   Enterococcus faecalis NOT DETECTED NOT DETECTED   Enterococcus Faecium NOT DETECTED NOT DETECTED   Listeria monocytogenes NOT DETECTED NOT DETECTED   Staphylococcus species NOT DETECTED NOT DETECTED   Staphylococcus aureus (BCID) NOT DETECTED NOT DETECTED   Staphylococcus epidermidis NOT DETECTED NOT DETECTED   Staphylococcus lugdunensis NOT DETECTED NOT DETECTED   Streptococcus species DETECTED (A) NOT DETECTED   Streptococcus agalactiae NOT DETECTED NOT DETECTED   Streptococcus pneumoniae NOT DETECTED NOT DETECTED   Streptococcus pyogenes NOT DETECTED NOT DETECTED   A.calcoaceticus-baumannii NOT DETECTED NOT DETECTED   Bacteroides fragilis NOT DETECTED NOT DETECTED   Enterobacterales NOT DETECTED NOT DETECTED   Enterobacter cloacae complex NOT DETECTED NOT DETECTED   Escherichia coli NOT DETECTED NOT DETECTED   Klebsiella aerogenes NOT DETECTED NOT DETECTED   Klebsiella oxytoca NOT DETECTED NOT DETECTED   Klebsiella pneumoniae  NOT DETECTED NOT DETECTED   Proteus species NOT DETECTED NOT DETECTED   Salmonella species NOT DETECTED NOT DETECTED   Serratia marcescens NOT DETECTED NOT DETECTED   Haemophilus influenzae NOT DETECTED NOT DETECTED   Neisseria meningitidis NOT DETECTED NOT DETECTED   Pseudomonas aeruginosa NOT DETECTED NOT DETECTED   Stenotrophomonas maltophilia NOT DETECTED NOT DETECTED   Candida albicans NOT DETECTED NOT DETECTED   Candida auris NOT DETECTED NOT DETECTED   Candida glabrata NOT DETECTED NOT DETECTED   Candida krusei NOT DETECTED NOT DETECTED   Candida parapsilosis NOT DETECTED NOT DETECTED   Candida tropicalis NOT DETECTED NOT DETECTED   Cryptococcus neoformans/gattii NOT DETECTED NOT DETECTED    [name], Suzen Acre 02/28/2024  4:57 PM

## 2024-02-28 NOTE — Progress Notes (Signed)
 RT stuck twice for ABG and unable to obtain sample.

## 2024-02-28 NOTE — ED Notes (Signed)
 In and out cath performed with minimal output

## 2024-02-28 NOTE — Procedures (Signed)
 Patient Name: Victoria [name]  MRN: [id number]  Epilepsy Attending: Arlin MALVA Krebs  Referring Physician/Provider: Emilio Norleen BIRCH, PA-C  Date: 02/28/2024 Duration: 23.21 mins  Patient history: 78yo F with ams. EEG to evaluate for seizure  Level of alertness: comatose  AEDs during EEG study: Propofol   Technical aspects: This EEG study was done with scalp electrodes positioned according to the 10-20 International system of electrode placement. Electrical activity was reviewed with band pass filter of 1-70Hz , sensitivity of 7 uV/mm, display speed of 53mm/sec with a 60Hz  notched filter applied as appropriate. EEG data were recorded continuously and digitally stored.  Video monitoring was available and reviewed as appropriate.  Description: EEG showed continuous generalized 3 to 6 Hz theta-delta slowing. Hyperventilation and photic stimulation were not performed.     ABNORMALITY - Continuous slow, generalized  IMPRESSION: This study is suggestive generalized nonspecific dysfunction (encephalopathy). No seizures or epileptiform discharges were seen throughout the recording.  Lourie Retz O German Manke

## 2024-02-28 NOTE — Progress Notes (Signed)
 Pt transported from ED26 to 3M10 by RN and RT w/o complications

## 2024-02-28 NOTE — Progress Notes (Signed)
 Patient growing strep species in 3/4 bottles. Will d/c vancomycin and flagyl. Will continue Cefepime for now.

## 2024-02-28 NOTE — ED Provider Notes (Signed)
 INTUBATION Performed by: Burnard Light  Required items: required blood products, implants, devices, and special equipment available Patient identity confirmed: provided demographic data and hospital-assigned identification number Time out: Immediately prior to procedure a time out was called to verify the correct patient, procedure, equipment, support staff and site/side marked as required.  Indications: AMS  Intubation method: Glidescope Laryngoscopy   Preoxygenation: BVM  Sedatives: 20mg  Etomidate Paralytic: 100mg  Succinylcholine  Tube Size: 7.5 cuffed  Post-procedure assessment: chest rise and ETCO2 monitor Breath sounds: equal and absent over the epigastrium Tube secured with: ETT holder Chest x-ray interpreted by radiologist and me.  Chest x-ray findings: endotracheal tube in appropriate position  Patient tolerated the procedure well with no immediate complications.     Light Burnard, PA-C 02/28/24 [id number]    Haze Lonni PARAS, MD 02/28/24 [phone]

## 2024-02-28 NOTE — Progress Notes (Signed)
 EEG complete - results pending

## 2024-02-28 NOTE — Progress Notes (Signed)
 Pharmacy Antibiotic Note  Victoria [name] is a 78 y.o. female admitted on 02/27/2024 with AMS.  Pharmacy has been consulted for cefepime/vancomycin dosing for sepsis concerns.  -WBC 16, sCr 1 (bl ~0.8-0.9), Tmax 102.5 -CXR: 2 small nodular densities in right lung apex  -Imaging negative  Plan: -Cefepime 2g IV every 12 hours -Vancomycin 2g IV x1 -Vancomycin 750mg  IV every 24 hours (AUC 486, Vd 0.5, IBW, sCr 1) -Flagyl 500mg  IV every 12 hours -Monitor renal function -Follow up signs of clinical improvement, LOT, de-escalation of antibiotics   Height: 5' 4.5 (163.8 cm) Weight: 84.4 kg (186 lb) (from May 2025 records, please update) IBW/kg (Calculated) : 55.85  Temp (24hrs), Avg:101.3 F (38.5 C), Min:100.1 F (37.8 C), Max:102.5 F (39.2 C)  Recent Labs  Lab 02/27/24 2127 02/27/24 2130 02/27/24 2341  WBC  --  16.3*  --   CREATININE  --  1.00  --   LATICACIDVEN 4.1*  --  2.6*    Estimated Creatinine Clearance: 47.7 mL/min (by C-G formula based on SCr of 1 mg/dL).    Allergies  Allergen Reactions   Benicar  [Olmesartan ] Swelling    Swelling of face and arms    [name]  [Valsartan ] Swelling    Swelling of face and arms    Hydrocodone -Acetaminophen  Nausea And Vomiting    Severe vomiting   Lisinopril  Cough   Monosodium Glutamate Other (See Comments)    Facial swelling per pt   Penicillins     Unknown reaction per family at the bedside   Codeine Other (See Comments)    jittery   Lead Acetate Rash   Nickel Rash    Severe rash to infection: pt is allergic to all metals other than sterling silver or gold jewelry.     Antimicrobials this admission: Cefepime 10/13 >>  Vancomycin 10/13 >>  Flagyl 10/13 >>  Microbiology results: 10/13 BCx:  10/14 UCx: ordered  10/14 Sputum: ordered  10/14 MRSA PCR: ordered  Thank you for allowing pharmacy to be a part of this patient's care.  Lynwood Poplar, PharmD, BCPS Clinical Pharmacist 02/28/2024 6:00 AM

## 2024-02-28 NOTE — Plan of Care (Signed)

## 2024-02-28 NOTE — ED Provider Notes (Signed)
 Patient signed out to me by Dr. Elnor to follow-up on CT scan and disposition patient.  Patient seen with altered mental status and felt to be septic, unclear source.  CT abdomen and pelvis pending at time of signout.  Physical Exam  BP (!) 206/85   Pulse (!) 106   Temp (!) 102.5 F (39.2 C) (Rectal)   Resp 19   Ht 5' 4.5 (1.638 m)   Wt 84.4 kg Comment: from May 2025 records, please update  SpO2 100%   BMI 31.43 kg/m   Physical Exam Constitutional:      Appearance: She is ill-appearing.  HENT:     Head: Atraumatic.     Mouth/Throat:     Mouth: Mucous membranes are dry.  Cardiovascular:     Rate and Rhythm: Regular rhythm. Tachycardia present.  Pulmonary:     Effort: Accessory muscle usage, prolonged expiration and respiratory distress present.     Breath sounds: Decreased air movement present. Decreased breath sounds present.  Abdominal:     Comments: Using abdominal muscles to help breathe  Skin:    General: Skin is warm.  Neurological:     Mental Status: She is lethargic.     GCS: GCS eye subscore is 2. GCS verbal subscore is 2. GCS motor subscore is 5.     Procedures  .Critical Care  Performed by: Haze Lonni PARAS, MD Authorized by: Haze Lonni PARAS, MD   Critical care provider statement:    Critical care time (minutes):  30   Critical care time was exclusive of:  Separately billable procedures and treating other patients and teaching time   Critical care was necessary to treat or prevent imminent or life-threatening deterioration of the following conditions:  Respiratory failure and CNS failure or compromise   Critical care was time spent personally by me on the following activities:  Development of treatment plan with patient or surrogate, discussions with consultants, evaluation of patient's response to treatment, examination of patient, ordering and review of laboratory studies, ordering and review of radiographic studies, ordering and performing  treatments and interventions, pulse oximetry, re-evaluation of patient's condition and review of old charts   I assumed direction of critical care for this patient from another provider in my specialty: yes     Care discussed with: admitting provider     ED Course / MDM    Medical Decision Making Amount and/or Complexity of Data Reviewed Independent Historian: caregiver Radiology: ordered and independent interpretation performed. Decision-making details documented in ED Course.  Risk Prescription drug management. Decision regarding hospitalization.   Patient with sepsis of unclear etiology.  CT scan has resulted without any acute findings.  Patient has progressively worsened with her respiratory status.  She initially was having some increased work of breathing but was able to interact and talk with her family.  She now has increased work of breathing and is no longer responding verbally.  I discussed CODE STATUS with family at bedside.  They did want her intubated and would want CPR if she coded at this time.  Patient was intubated by Burnard Light PA-C under my direct supervision.  Please see her separate procedure note.  Patient initiated on propofol  for sedation.  Blood pressure is coming down (was very hypertensive with systolic blood pressures above 200). I suspect that she may drop further and potentially require pressors.  Will watch closely.  Patient still agitated on propofol  drip, will add some fentanyl .  Patient to be admitted to ICU.  Haze Lonni PARAS, MD 02/28/24 [phone]

## 2024-02-28 NOTE — H&P (Addendum)
 NAME:  Victoria [name], MRN:  [id number], DOB:  1945-07-31, LOS: 0 ADMISSION DATE:  02/27/2024, CONSULTATION DATE:  10/14 REFERRING MD:  dr. Haze, CHIEF COMPLAINT:  Acute respiratory failure    History of Present Illness:  Patient is an 78 yo F w/ pertinent PMH COPD on 2L [place] at home, OSA, HTN, HLD, T2DM, CKD 3a presents to [facility] on 10/14 w/ AMS.  [name] LKN 10/10. Family spoke with patient on phone on 10/12 overnight and patient sounded little confused. Neighbor attempted to check on her on 10/13 but no response. EMS called found patient on floor confused. Noted to be tachycardic and febrile. Brought to [facility] ED. Tempt 102.5 F, tachy 120s, RR 30s. Placed on [place] 3L w/ sats 96%. Wbc 16 and LA 4.1. Cultures obtained, given fluids, and started on broad spectrum abx. UA w/ rare bacteria; negative nitrite. CXR w/ no focal infiltrate; 2 small nodular densities in right lung apex recommending non emergent ct chest. CT head/c-spine no acute abnormality. CT chest/abd/pelvis no acute findings. Vbg 7.51, 34, 70, 27. Patient agitated pulling at cords. Received ativan and geodon. Patient became more lethargic and increased respiratory distress. Patient intubated. PCCM consulted.  Pertinent ED labs: glucose 228, trop 337 then 390, ck 661  Pertinent  Medical History   Past Medical History:  Diagnosis Date   Abdominal aneurysm    Dr. Harvey follows lLOV 2 ''17 per pt around 2 cm   Anemia    as a child   Arthritis    left ankle, right knee, right SI joint, wrists, lower back   Breast cancer in female [facility])    Left   COPD (chronic obstructive pulmonary disease) (HCC)    ephysema-Dr. Geronimo   Dyspnea    Headache    as a child would have terrible headaches during season changes   Heart murmur    congenital, 2 D echo '10   Hyperlipidemia    Hypertension    Multiple thyroid  nodules    Murmur, cardiac 1950   Osteoporosis    Pneumonia    Pre-diabetes    Requires continuous at home  supplemental oxygen     2 L 24/7   Sebaceous cyst    hairline sebaceous cyst left posterior neck to be excised 04-13-16 by Dr. Eletha in OR [facility]   Sleep apnea    cpap used sometimes, uses oxygen  concentrator 2 l/m nasally bedtime   Varicella as child     Significant Hospital Events: Including procedures, antibiotic start and stop dates in addition to other pertinent events   02/28/2024 patient is a intubated in the ER for altered mental status.  Interim History / Subjective:  By the time I went to see the patient in the ER she was comfortably resting saying with vent on propofol  and fentanyl .  There is no family around they just left.  ER physician spoke with the family side to family to be in full code.  Objective    Blood pressure (!) 114/50, pulse 83, temperature (!) 101.2 F (38.4 C), resp. rate (!) 22, height 5' 4.5 (1.638 m), weight 84.4 kg, SpO2 100%.    Vent Mode: PRVC FiO2 (%):  [40 %-60 %] 40 % Set Rate:  [20 bmp] 20 bmp Vt Set:  [440 mL] 440 mL PEEP:  [5 cmH20] 5 cmH20 Plateau Pressure:  [15 cmH20] 15 cmH20  No intake or output data in the 24 hours ending 02/28/24 0524 Filed Weights   02/28/24 0200  Weight:  84.4 kg    Examination:   Physical exam: General: Crtitically ill-appearing female, orally intubated HEENT: [place]/AT, eyes anicteric.  ETT and cortrak in place Neuro: Sedated, not following commands.  Eyes are closed.  Both pupils equal and sluggish reaction to light  chest: Coarse breath sounds, no wheezes or rhonchi Heart: Regular rate and rhythm, no murmurs or gallops Abdomen: Soft, nondistended, bowel sounds present     Resolved problem list   Assessment and Plan  Altered mental status Acute Encephalopathy Acute respiratory failure   Analgesia: Fentanyl  Sedation: Propofol  RAS Goal 0 to -1 Imaging: CT head and CT neck and spine did not show anything acute EEG is pending SAT and SBT in the morning MAP goal above 65 Ventilator order  set in place Daily SAT SBT Ammonia level If does not wake up well on SAT will consider MRI brain Persistent antibiotic therapy despite sepsis will consider LP N.p.o. for now if remains intubated tomorrow morning will consider trickle feeds  Severe sepsis concerns for UTI  - Continue broad-spectrum antibiotics -CT chest abdomen pelvis did not show any acute infectious etiology -Follow-up blood cultures -Follow urine cultures - Follow lactic acid -Patient is hemodynamically stable currently consider Levophed to keep her MAP above 65 if needed -Monitor urine output and hepatitis   Type 2 Diabetes Mellitus  -HbA1C pending - Maintain blood sugars between 140 to 180   H/o COPD - Currently not in exacerbation -Continue home inhalers   VTE Prophylaxis -Lovenox  GI prophylaxis for mechanically ventilated patients with Protonix Code Status-full code Disposition-ICU  Critical Care time -45 minutes    Labs   CBC: Recent Labs  Lab 02/27/24 2130 02/27/24 2341  WBC 16.3*  --   NEUTROABS 13.9*  --   HGB 13.2 12.9  HCT 42.3 38.0  MCV 87.8  --   PLT 240  --     Basic Metabolic Panel: Recent Labs  Lab 02/27/24 2130 02/27/24 2341  NA 137 138  K 4.1 3.3*  CL 100  --   CO2 24  --   GLUCOSE 228*  --   BUN 16  --   CREATININE 1.00  --   CALCIUM  9.3  --    GFR: Estimated Creatinine Clearance: 47.7 mL/min (by C-G formula based on SCr of 1 mg/dL). Recent Labs  Lab 02/27/24 2127 02/27/24 2130 02/27/24 2341  WBC  --  16.3*  --   LATICACIDVEN 4.1*  --  2.6*    Liver Function Tests: Recent Labs  Lab 02/27/24 2130  AST 62*  ALT 43  ALKPHOS 91  BILITOT 0.8  PROT 6.9  ALBUMIN 3.3*   No results for input(s): LIPASE, AMYLASE in the last 168 hours. No results for input(s): AMMONIA in the last 168 hours.  ABG    Component Value Date/Time   HCO3 27.5 02/27/2024 2341   TCO2 29 02/27/2024 2341   O2SAT 96 02/27/2024 2341     Coagulation Profile: Recent Labs   Lab 02/27/24 2130  INR 0.9    Cardiac Enzymes: Recent Labs  Lab 02/27/24 2130  CKTOTAL 661*    HbA1C: Hemoglobin A1C  Date/Time Value Ref Range Status  01/25/2023 12:50 PM 6.1 (A) 4.0 - 5.6 % Final  07/19/2022 01:20 PM 7.2 (A) 4.0 - 5.6 % Final   Hgb A1c MFr Bld  Date/Time Value Ref Range Status  01/25/2021 12:08 PM 6.3 (H) 4.8 - 5.6 % Final    Comment:    (NOTE) Pre diabetes:  5.7%-6.4%  Diabetes:              >6.4%  Glycemic control for   <7.0% adults with diabetes   08/31/2017 07:11 AM 7.3 (H) 4.8 - 5.6 % Final    Comment:    (NOTE) Pre diabetes:          5.7%-6.4% Diabetes:              >6.4% Glycemic control for   <7.0% adults with diabetes     CBG: Recent Labs  Lab 02/27/24 2130  GLUCAP 230*    Review of Systems:   Review of systems not be done as the patient is sedated and intubated.  Past Medical History:  She,  has a past medical history of Abdominal aneurysm, Anemia, Arthritis, Breast cancer in female [facility]), COPD (chronic obstructive pulmonary disease) (HCC), Dyspnea, Headache, Heart murmur, Hyperlipidemia, Hypertension, Multiple thyroid  nodules, Murmur, cardiac (1950), Osteoporosis, Pneumonia, Pre-diabetes, Requires continuous at home supplemental oxygen , Sebaceous cyst, Sleep apnea, and Varicella (as child).   Surgical History:   Past Surgical History:  Procedure Laterality Date   APPENDECTOMY     2008   BREAST LUMPECTOMY Left 08/31/2017   BREAST LUMPECTOMY WITH RADIOACTIVE SEED LOCALIZATION Left 08/10/2017   Procedure: BREAST LUMPECTOMY WITH RADIOACTIVE SEED LOCALIZATION;  Surgeon: Ebbie Cough, MD;  Location: [facility] OR;  Service: General;  Laterality: Left;   CESAREAN SECTION     1972   COLONOSCOPY     COLONOSCOPY WITH PROPOFOL  N/A 04/16/2016   Procedure: COLONOSCOPY WITH PROPOFOL ;  Surgeon: Belvie Just, MD;  Location: [facility] ENDOSCOPY;  Service: Endoscopy;  Laterality: N/A;   CYST REMOVAL NECK Left 04/13/2016   Procedure:  EXCISION OF SEBACEOUS CYST LEFT POSTERIOR NECK;  Surgeon: Krystal Spinner, MD;  Location: [facility] OR;  Service: General;  Laterality: Left;   Excision of Pelvic Absess, Right Ovary     2008   RE-EXCISION OF BREAST CANCER,SUPERIOR MARGINS Left 08/31/2017   Procedure: RE-EXCISION OF LEFT  BREAST MARGINS ERAS PATHWAY;  Surgeon: Ebbie Cough, MD;  Location: [facility] OR;  Service: General;  Laterality: Left;   TUBAL LIGATION  1980     Social History:   reports that she quit smoking about 12 years ago. Her smoking use included cigarettes. She started smoking about 62 years ago. She has a 50 pack-year smoking history. She has never used smokeless tobacco. She reports current alcohol use. She reports that she does not use drugs.   Family History:  Her family history includes Alzheimer's disease in her brother and father; Breast cancer in her paternal grandmother; Diabetes in her brother; Heart disease in her mother; Hyperlipidemia in her brother and brother; Hypertension in her brother, brother, and mother; Stroke (age of onset: 90) in her father.   Allergies Allergies  Allergen Reactions   Benicar  [Olmesartan ] Swelling    Swelling of face and arms    [name]  [Valsartan ] Swelling    Swelling of face and arms    Hydrocodone -Acetaminophen  Nausea And Vomiting    Severe vomiting   Lisinopril  Cough   Monosodium Glutamate Other (See Comments)    Facial swelling per pt   Penicillins     Unknown reaction per family at the bedside   Codeine Other (See Comments)    jittery   Lead Acetate Rash   Nickel Rash    Severe rash to infection: pt is allergic to all metals other than sterling silver or gold jewelry.      Home Medications  Prior to Admission  medications   Medication Sig Start Date End Date Taking? Authorizing Provider  Accu-Chek Softclix Lancets lancets Use as instructed to check blood sugar 1x daily 03/30/23   Shamleffer, Ibtehal Jaralla, MD  acetaminophen  (TYLENOL ) 500 MG tablet Take 1,000 mg by  mouth every 6 (six) hours as needed.    [provider]  albuterol  (PROVENTIL ) (2.5 MG/3ML) 0.083% nebulizer solution Take 3 mLs (2.5 mg total) by nebulization every 6 (six) hours as needed for wheezing or shortness of breath. 12/31/21   Parrett, Madelin RAMAN, NP  Albuterol  Sulfate (PROAIR  RESPICLICK) 108 (90 Base) MCG/ACT AEPB Inhale 1-2 puffs into the lungs every 6 (six) hours as needed (for wheezing/shortness of breath). 03/31/20   Geronimo Amel, MD  amLODipine  (NORVASC ) 10 MG tablet TAKE ONE TABLET BY MOUTH ONCE DAILY 11/24/22   Burnard Debby LABOR, MD  aspirin  EC 81 MG tablet Take 1 tablet (81 mg total) by mouth daily. 07/23/19   Kroeger, Krista M., PA-C  Blood Glucose Monitoring Suppl (ACCU-CHEK AVIVA PLUS) w/Device KIT Use to monitor glucose levels once per day; E11.9 06/10/23   Shamleffer, Donell Cardinal, MD  carvedilol  (COREG ) 6.25 MG tablet TAKE ONE TABLET BY MOUTH EVERY MORNING and TAKE ONE TABLET BY MOUTH EVERYDAY AT BEDTIME 11/24/22   Burnard Debby LABOR, MD  Cholecalciferol (VITAMIN D3) 125 MCG (5000 UT) CAPS TAKE ONE CAPSULE BY MOUTH ONCE DAILY 12/29/22   Joshua Debby CROME, MD  dapagliflozin  propanediol (FARXIGA ) 10 MG TABS tablet Take 1 tablet (10 mg total) by mouth daily. 09/20/23   Shamleffer, Ibtehal Jaralla, MD  denosumab  (PROLIA ) 60 MG/ML SOSY injection Inject 60 mg into the skin every 6 (six) months. 06/16/22   Joshua Debby CROME, MD  FEROSUL 325 (65 Fe) MG tablet Take 325 mg by mouth 3 (three) times a week. 08/09/23   [provider]  Fluticasone -Umeclidin-Vilant (TRELEGY ELLIPTA ) 100-62.5-25 MCG/ACT AEPB INHALE ONE (1) PUFF BY MOUTH AND INTO THE LUNGS DAILY 12/12/23   Geronimo Amel, MD  glucose blood (ACCU-CHEK AVIVA PLUS) test strip USE TO Check blood glucose 1x DAILY 06/03/23   Shamleffer, Ibtehal Jaralla, MD  glucose blood (ACCU-CHEK GUIDE) test strip USE 1 STRIP ONCE DAILY 04/09/20   Kassie Mallick, MD  glucose blood test strip USE 1 STRIP ONCE DAILY 02/29/20   Kassie Mallick, MD   methocarbamol  (ROBAXIN ) 500 MG tablet Take 1 tablet (500 mg total) by mouth every 8 (eight) hours as needed for muscle spasms. 08/19/22   Elnor Lauraine BRAVO, NP  OXYGEN  Inhale 2-3 L into the lungs continuous. When exerting self    [provider]  Potassium Chloride  ER 20 MEQ TBCR Take 1 tablet by mouth every morning. 01/07/23   [provider]  roflumilast  (DALIRESP ) 500 MCG TABS tablet TAKE ONE TABLET BY MOUTH EVERY MORNING 11/25/22   Geronimo Amel, MD  rosuvastatin  (CRESTOR ) 40 MG tablet TAKE ONE TABLET BY MOUTH EVERYDAY AT BEDTIME 11/24/22   Burnard Debby LABOR, MD  SitaGLIPtin -MetFORMIN  HCl (JANUMET  XR) 50-1000 MG TB24 Take 1 tablet by mouth daily before supper. 09/20/23   Shamleffer, Donell Cardinal, MD  spironolactone  (ALDACTONE ) 25 MG tablet TAKE ONE TABLET BY MOUTH ONCE DAILY 11/24/22   Burnard Debby LABOR, MD  thiamine  (VITAMIN B1) 100 MG tablet TAKE 1 TABLET BY MOUTH EVERY OTHER DAY 01/20/23   Joshua Debby CROME, MD     Critical care time: 45 minutes      Tamela Stakes, MD  Attending Physician, Critical Care Medicine [facility] See Amion for  pager If no response to pager, please call [phone] until 7pm After 7pm, Please call E-link [phone]

## 2024-02-28 NOTE — Plan of Care (Signed)
 Problem: Education: Goal: Ability to describe self-care measures that may prevent or decrease complications (Diabetes Survival Skills Education) will improve 02/28/2024 1907 by Val Violeta FALCON, RN Outcome: Progressing 02/28/2024 1907 by Val Violeta FALCON, RN Outcome: Progressing Goal: Individualized Educational Video(s) 02/28/2024 1907 by Val Violeta FALCON, RN Outcome: Progressing 02/28/2024 1907 by Val Violeta FALCON, RN Outcome: Progressing   Problem: Coping: Goal: Ability to adjust to condition or change in health will improve 02/28/2024 1907 by Val Violeta FALCON, RN Outcome: Progressing 02/28/2024 1907 by Val Violeta FALCON, RN Outcome: Progressing   Problem: Fluid Volume: Goal: Ability to maintain a balanced intake and output will improve 02/28/2024 1907 by Val Violeta FALCON, RN Outcome: Progressing 02/28/2024 1907 by Val Violeta FALCON, RN Outcome: Progressing   Problem: Health Behavior/Discharge Planning: Goal: Ability to identify and utilize available resources and services will improve 02/28/2024 1907 by Val Violeta FALCON, RN Outcome: Progressing 02/28/2024 1907 by Val Violeta FALCON, RN Outcome: Progressing Goal: Ability to manage health-related needs will improve 02/28/2024 1907 by Val Violeta FALCON, RN Outcome: Progressing 02/28/2024 1907 by Val Violeta FALCON, RN Outcome: Progressing   Problem: Metabolic: Goal: Ability to maintain appropriate glucose levels will improve 02/28/2024 1907 by Val Violeta FALCON, RN Outcome: Progressing 02/28/2024 1907 by Val Violeta FALCON, RN Outcome: Progressing   Problem: Nutritional: Goal: Maintenance of adequate nutrition will improve 02/28/2024 1907 by Val Violeta FALCON, RN Outcome: Progressing 02/28/2024 1907 by Val Violeta FALCON, RN Outcome: Progressing Goal: Progress toward achieving an optimal weight will improve 02/28/2024 1907 by Val Violeta FALCON, RN Outcome: Progressing 02/28/2024 1907 by  Val Violeta FALCON, RN Outcome: Progressing   Problem: Skin Integrity: Goal: Risk for impaired skin integrity will decrease 02/28/2024 1907 by Val Violeta FALCON, RN Outcome: Progressing 02/28/2024 1907 by Val Violeta FALCON, RN Outcome: Progressing   Problem: Tissue Perfusion: Goal: Adequacy of tissue perfusion will improve 02/28/2024 1907 by Val Violeta FALCON, RN Outcome: Progressing 02/28/2024 1907 by Val Violeta FALCON, RN Outcome: Progressing   Problem: Education: Goal: Knowledge of General Education information will improve Description: Including pain rating scale, medication(s)/side effects and non-pharmacologic comfort measures 02/28/2024 1907 by Val Violeta FALCON, RN Outcome: Progressing 02/28/2024 1907 by Val Violeta FALCON, RN Outcome: Progressing   Problem: Health Behavior/Discharge Planning: Goal: Ability to manage health-related needs will improve 02/28/2024 1907 by Val Violeta FALCON, RN Outcome: Progressing 02/28/2024 1907 by Val Violeta FALCON, RN Outcome: Progressing   Problem: Clinical Measurements: Goal: Ability to maintain clinical measurements within normal limits will improve 02/28/2024 1907 by Val Violeta FALCON, RN Outcome: Progressing 02/28/2024 1907 by Val Violeta FALCON, RN Outcome: Progressing Goal: Will remain free from infection 02/28/2024 1907 by Val Violeta FALCON, RN Outcome: Progressing 02/28/2024 1907 by Val Violeta FALCON, RN Outcome: Progressing Goal: Diagnostic test results will improve 02/28/2024 1907 by Val Violeta FALCON, RN Outcome: Progressing 02/28/2024 1907 by Val Violeta FALCON, RN Outcome: Progressing Goal: Respiratory complications will improve 02/28/2024 1907 by Val Violeta FALCON, RN Outcome: Progressing 02/28/2024 1907 by Val Violeta FALCON, RN Outcome: Progressing Goal: Cardiovascular complication will be avoided 02/28/2024 1907 by Val Violeta FALCON, RN Outcome: Progressing 02/28/2024 1907 by Val Violeta FALCON, RN Outcome: Progressing   Problem: Activity: Goal: Risk for activity intolerance will decrease 02/28/2024 1907 by Val Violeta FALCON, RN Outcome: Progressing 02/28/2024 1907 by Val Violeta FALCON, RN Outcome: Progressing   Problem: Nutrition: Goal: Adequate nutrition will be maintained 02/28/2024 1907 by Val Violeta FALCON, RN Outcome: Progressing 02/28/2024 1907 by Val Violeta FALCON, RN Outcome: Progressing  Problem: Coping: Goal: Level of anxiety will decrease 02/28/2024 1907 by Val Violeta FALCON, RN Outcome: Progressing 02/28/2024 1907 by Val Violeta FALCON, RN Outcome: Progressing   Problem: Elimination: Goal: Will not experience complications related to bowel motility 02/28/2024 1907 by Val Violeta FALCON, RN Outcome: Progressing 02/28/2024 1907 by Val Violeta FALCON, RN Outcome: Progressing Goal: Will not experience complications related to urinary retention 02/28/2024 1907 by Val Violeta FALCON, RN Outcome: Progressing 02/28/2024 1907 by Val Violeta FALCON, RN Outcome: Progressing   Problem: Pain Managment: Goal: General experience of comfort will improve and/or be controlled 02/28/2024 1907 by Val Violeta FALCON, RN Outcome: Progressing 02/28/2024 1907 by Val Violeta FALCON, RN Outcome: Progressing   Problem: Safety: Goal: Ability to remain free from injury will improve 02/28/2024 1907 by Val Violeta FALCON, RN Outcome: Progressing 02/28/2024 1907 by Val Violeta FALCON, RN Outcome: Progressing   Problem: Skin Integrity: Goal: Risk for impaired skin integrity will decrease 02/28/2024 1907 by Val Violeta FALCON, RN Outcome: Progressing 02/28/2024 1907 by Val Violeta FALCON, RN Outcome: Progressing

## 2024-02-29 ENCOUNTER — Inpatient Hospital Stay (HOSPITAL_COMMUNITY)

## 2024-02-29 DIAGNOSIS — R569 Unspecified convulsions: Secondary | ICD-10-CM | POA: Diagnosis not present

## 2024-02-29 DIAGNOSIS — G9341 Metabolic encephalopathy: Secondary | ICD-10-CM | POA: Diagnosis not present

## 2024-02-29 DIAGNOSIS — N179 Acute kidney failure, unspecified: Secondary | ICD-10-CM | POA: Diagnosis not present

## 2024-02-29 DIAGNOSIS — E119 Type 2 diabetes mellitus without complications: Secondary | ICD-10-CM | POA: Diagnosis not present

## 2024-02-29 DIAGNOSIS — I6340 Cerebral infarction due to embolism of unspecified cerebral artery: Secondary | ICD-10-CM | POA: Diagnosis not present

## 2024-02-29 DIAGNOSIS — M6282 Rhabdomyolysis: Secondary | ICD-10-CM

## 2024-02-29 DIAGNOSIS — G9340 Encephalopathy, unspecified: Secondary | ICD-10-CM | POA: Diagnosis not present

## 2024-02-29 DIAGNOSIS — I639 Cerebral infarction, unspecified: Secondary | ICD-10-CM | POA: Diagnosis not present

## 2024-02-29 DIAGNOSIS — I6389 Other cerebral infarction: Secondary | ICD-10-CM | POA: Diagnosis not present

## 2024-02-29 LAB — CSF CELL COUNT WITH DIFFERENTIAL
RBC Count, CSF: 1 /mm3 — ABNORMAL HIGH
RBC Count, CSF: 3 /mm3 — ABNORMAL HIGH
Tube #: 1
WBC, CSF: 1 /mm3 (ref 0–5)
WBC, CSF: 6 /mm3 — ABNORMAL HIGH (ref 0–5)

## 2024-02-29 LAB — GLUCOSE, CAPILLARY
Glucose-Capillary: 109 mg/dL — ABNORMAL HIGH (ref 70–99)
Glucose-Capillary: 115 mg/dL — ABNORMAL HIGH (ref 70–99)
Glucose-Capillary: 124 mg/dL — ABNORMAL HIGH (ref 70–99)
Glucose-Capillary: 124 mg/dL — ABNORMAL HIGH (ref 70–99)
Glucose-Capillary: 135 mg/dL — ABNORMAL HIGH (ref 70–99)
Glucose-Capillary: 135 mg/dL — ABNORMAL HIGH (ref 70–99)
Glucose-Capillary: 137 mg/dL — ABNORMAL HIGH (ref 70–99)

## 2024-02-29 LAB — CBC
HCT: 35.9 % — ABNORMAL LOW (ref 36.0–46.0)
Hemoglobin: 11.1 g/dL — ABNORMAL LOW (ref 12.0–15.0)
MCH: 27.4 pg (ref 26.0–34.0)
MCHC: 30.9 g/dL (ref 30.0–36.0)
MCV: 88.6 fL (ref 80.0–100.0)
Platelets: 150 K/uL (ref 150–400)
RBC: 4.05 MIL/uL (ref 3.87–5.11)
RDW: 15.4 % (ref 11.5–15.5)
WBC: 12.9 K/uL — ABNORMAL HIGH (ref 4.0–10.5)
nRBC: 0 % (ref 0.0–0.2)

## 2024-02-29 LAB — ECHOCARDIOGRAM COMPLETE
Area-P 1/2: 3.08 cm2
Height: 64.5 in
Weight: 2976 [oz_av]

## 2024-02-29 LAB — MENINGITIS/ENCEPHALITIS PANEL (CSF)

## 2024-02-29 LAB — PROTEIN AND GLUCOSE, CSF
Glucose, CSF: 87 mg/dL — ABNORMAL HIGH (ref 40–70)
Total  Protein, CSF: 53 mg/dL — ABNORMAL HIGH (ref 15–45)

## 2024-02-29 LAB — MAGNESIUM: Magnesium: 1.6 mg/dL — ABNORMAL LOW (ref 1.7–2.4)

## 2024-02-29 LAB — BASIC METABOLIC PANEL WITH GFR
Anion gap: 11 (ref 5–15)
BUN: 26 mg/dL — ABNORMAL HIGH (ref 8–23)
CO2: 24 mmol/L (ref 22–32)
Calcium: 8.1 mg/dL — ABNORMAL LOW (ref 8.9–10.3)
Chloride: 103 mmol/L (ref 98–111)
Creatinine, Ser: 1.37 mg/dL — ABNORMAL HIGH (ref 0.44–1.00)
GFR, Estimated: 39 mL/min — ABNORMAL LOW (ref >60)
Glucose, Bld: 134 mg/dL — ABNORMAL HIGH (ref 70–99)
Potassium: 3.4 mmol/L — ABNORMAL LOW (ref 3.5–5.1)
Sodium: 138 mmol/L (ref 135–145)

## 2024-02-29 LAB — LACTIC ACID, PLASMA: Lactic Acid, Venous: 1.2 mmol/L (ref 0.5–1.9)

## 2024-02-29 LAB — CK: Total CK: 2418 U/L — ABNORMAL HIGH (ref 38–234)

## 2024-02-29 LAB — TRIGLYCERIDES: Triglycerides: 207 mg/dL — ABNORMAL HIGH (ref <150)

## 2024-02-29 MED ORDER — POTASSIUM CHLORIDE 20 MEQ PO PACK
40.0000 meq | PACK | Freq: Once | ORAL | Status: AC
  Administered 2024-02-29: 40 meq
  Filled 2024-02-29: qty 2

## 2024-02-29 MED ORDER — MAGNESIUM SULFATE 4 GM/100ML IV SOLN
4.0000 g | Freq: Once | INTRAVENOUS | Status: AC
  Administered 2024-02-29: 4 g via INTRAVENOUS
  Filled 2024-02-29: qty 100

## 2024-02-29 MED ORDER — SODIUM CHLORIDE 0.9 % IV SOLN
2.0000 g | Freq: Two times a day (BID) | INTRAVENOUS | Status: DC
  Administered 2024-02-29 – 2024-03-05 (×11): 2 g via INTRAVENOUS
  Filled 2024-02-29 (×11): qty 20

## 2024-02-29 MED ORDER — HEPARIN SODIUM (PORCINE) 5000 UNIT/ML IJ SOLN
5000.0000 [IU] | Freq: Three times a day (TID) | INTRAMUSCULAR | Status: DC
Start: 2024-02-29 — End: 2024-03-01
  Administered 2024-02-29 – 2024-03-01 (×2): 5000 [IU] via SUBCUTANEOUS
  Filled 2024-02-29 (×2): qty 1

## 2024-02-29 MED ORDER — LACTATED RINGERS IV SOLN: INTRAVENOUS | Status: AC

## 2024-02-29 NOTE — Plan of Care (Signed)
 Transported pt to [facility], ECHO completed, LP done at bedside and norepi restarted for low bp. LR restarted.  Problem: Education: Goal: Ability to describe self-care measures that may prevent or decrease complications (Diabetes Survival Skills Education) will improve Outcome: Progressing Goal: Individualized Educational Video(s) Outcome: Progressing   Problem: Coping: Goal: Ability to adjust to condition or change in health will improve Outcome: Progressing   Problem: Fluid Volume: Goal: Ability to maintain a balanced intake and output will improve Outcome: Progressing   Problem: Health Behavior/Discharge Planning: Goal: Ability to identify and utilize available resources and services will improve Outcome: Progressing Goal: Ability to manage health-related needs will improve Outcome: Progressing   Problem: Metabolic: Goal: Ability to maintain appropriate glucose levels will improve Outcome: Progressing   Problem: Nutritional: Goal: Maintenance of adequate nutrition will improve Outcome: Progressing Goal: Progress toward achieving an optimal weight will improve Outcome: Progressing   Problem: Skin Integrity: Goal: Risk for impaired skin integrity will decrease Outcome: Progressing   Problem: Tissue Perfusion: Goal: Adequacy of tissue perfusion will improve Outcome: Progressing   Problem: Education: Goal: Knowledge of General Education information will improve Description: Including pain rating scale, medication(s)/side effects and non-pharmacologic comfort measures Outcome: Progressing   Problem: Health Behavior/Discharge Planning: Goal: Ability to manage health-related needs will improve Outcome: Progressing   Problem: Clinical Measurements: Goal: Ability to maintain clinical measurements within normal limits will improve Outcome: Progressing Goal: Will remain free from infection Outcome: Progressing Goal: Diagnostic test results will improve Outcome:  Progressing Goal: Respiratory complications will improve Outcome: Progressing Goal: Cardiovascular complication will be avoided Outcome: Progressing   Problem: Activity: Goal: Risk for activity intolerance will decrease Outcome: Progressing   Problem: Nutrition: Goal: Adequate nutrition will be maintained Outcome: Progressing   Problem: Coping: Goal: Level of anxiety will decrease Outcome: Progressing   Problem: Elimination: Goal: Will not experience complications related to bowel motility Outcome: Progressing Goal: Will not experience complications related to urinary retention Outcome: Progressing   Problem: Pain Managment: Goal: General experience of comfort will improve and/or be controlled Outcome: Progressing   Problem: Safety: Goal: Ability to remain free from injury will improve Outcome: Progressing   Problem: Skin Integrity: Goal: Risk for impaired skin integrity will decrease Outcome: Progressing

## 2024-02-29 NOTE — TOC CM/SW Note (Signed)
 Transition of Care [facility]) - Inpatient Brief Assessment   Patient Details  Name: Victoria [name] MRN: [id number] Date of Birth: 12/23/1945  Transition of Care [facility]) CM/SW Contact:    Tom-Johnson, Harvest Muskrat, RN Phone Number: 02/29/2024, 1:43 PM   Clinical Narrative:  Patient presented to the ED with Altered Mental Status after a wellness check by neighbor, found on the floor in urine and confused. Patient noted to be Febrile and Tachycardic in the ED. Admitted with Acute Respiratory Failure, intubated and sedated. Continues on IV abx.  CM unable to assess patient at this time, patient not Medically ready for discharge.  CM will continue to follow as patient progresses with care towards discharge.               Transition of Care Asessment:

## 2024-02-29 NOTE — Evaluation (Signed)
 RT Evaluate and Treat Note  02/29/2024   Breathing is (select one): Cannot answer questions   The following was found on auscultation (select multiple):  Bilateral Breath Sounds: Clear;Diminished (02/29/24 0842)  R Upper  Breath Sounds: Clear (02/29/24 0842) L Upper Breath Sounds: Clear (02/29/24 0842) R Lower Breath Sounds: Clear;Diminished (02/29/24 0842) L Lower Breath Sounds: Clear;Diminished (02/29/24 9157)    Cough Assessment: Cough: None (02/28/24 0445)    Most Recent Chest Xray:... (Emphysematous changes with mild linear scarring. Bibasilar linear opacities. Blunting of left lateral costophrenic angle. No pulmonary edema. No pleural effusion. No pneumothorax)   The following medications and/or interventions were ordered/changed/discontinued as part of the Respiratory Treatment protocol:   Medication Changes: none   Airway Clearance Changes: none   Oxygen  Therapy Changes:None

## 2024-02-29 NOTE — Progress Notes (Signed)
 Echocardiogram 2D Echocardiogram has been performed.  Victoria [name] 02/29/2024, 10:06 AM

## 2024-02-29 NOTE — Progress Notes (Signed)
 [facility] ADULT ICU REPLACEMENT PROTOCOL   The patient does apply for the [facility] Adult ICU Electrolyte Replacment Protocol based on the criteria listed below:   1.Exclusion criteria: TCTS, ECMO, Dialysis, and Myasthenia Gravis patients 2. Is GFR >/= 30 ml/min? Yes.    [name] GFR today is 39 3. Is SCr </= 2? Yes.   [name] SCr is 1.37 mg/dL 4. Did SCr increase >/= 0.5 in 24 hours? No. 5.Pt's weight >40kg  Yes.   6. Abnormal electrolyte(s): K+ = 3.4, Mg = 1.6  7. Electrolytes replaced per protocol 8.  Call MD STAT for K+ </= 2.5, Phos </= 1, or Mag </= 1 Physician:  Epimenio, eMD   Victoria [name] 02/29/2024 5:32 AM

## 2024-02-29 NOTE — Progress Notes (Signed)
 NAME:  Donice Alperin, MRN:  [id number], DOB:  [birth year], LOS: 1 ADMISSION DATE:  02/27/2024, CONSULTATION DATE:  02/28/24 REFERRING MD:  Haze, CHIEF COMPLAINT:  Acute respiratory failure   History of Present Illness:  Patient is an 78 yo F w/ pertinent PMH COPD on 2L [place] at home, OSA, HTN, HLD, T2DM, CKD 3a presents to [facility] on 10/14 w/ AMS.   [name] LKN 10/10. Family spoke with patient on phone on 10/12 overnight and patient sounded little confused. Neighbor attempted to check on her on 10/13 but no response. EMS called found patient on floor confused. Noted to be tachycardic and febrile. Brought to [facility] ED. Tempt 102.5 F, tachy 120s, RR 30s. Placed on [place] 3L w/ sats 96%. Wbc 16 and LA 4.1. Cultures obtained, given fluids, and started on broad spectrum abx. UA w/ rare bacteria; negative nitrite. CXR w/ no focal infiltrate; 2 small nodular densities in right lung apex recommending non emergent ct chest. CT head/c-spine no acute abnormality. CT chest/abd/pelvis no acute findings. Vbg 7.51, 34, 70, 27. Patient agitated pulling at cords. Received ativan and geodon. Patient became more lethargic and increased respiratory distress. Patient intubated. PCCM consulted.  Pertinent  Medical History   Past Medical History:  Diagnosis Date   Abdominal aneurysm    Dr. Harvey follows lLOV 2 ''17 per pt around 2 cm   Anemia    as a child   Arthritis    left ankle, right knee, right SI joint, wrists, lower back   Breast cancer in female [facility])    Left   COPD (chronic obstructive pulmonary disease) (HCC)    ephysema-Dr. Geronimo   Dyspnea    Headache    as a child would have terrible headaches during season changes   Heart murmur    congenital, 2 D echo '10   Hyperlipidemia    Hypertension    Multiple thyroid  nodules    Murmur, cardiac 1950   Osteoporosis    Pneumonia    Pre-diabetes    Requires continuous at home supplemental oxygen     2 L 24/7   Sebaceous cyst    hairline  sebaceous cyst left posterior neck to be excised 04-13-16 by Dr. Eletha in [place]   Sleep apnea    cpap used sometimes, uses oxygen  concentrator 2 l/m nasally bedtime   Varicella as child   Significant Hospital Events: Including procedures, antibiotic start and stop dates in addition to other pertinent events   02/28/2024 patient is a intubated in the ER for altered mental status; Brain MRI with multiple strokes  Interim History / Subjective:  Overnight mildly hypotensive, maintained MAPs.  UOP 550 mL yesterday, overall down 350 mL.  Leukocytosis 12.3 > 12.9, Hgb stable 11.1 Cr 1.37, BUN 26 Na 138, K 3.4 Mag 1.6 CK 2418  Objective    Blood pressure (!) 117/52, pulse 72, temperature 97.9 F (36.6 C), resp. rate 20, height 5' 4.5 (1.638 m), weight 84.4 kg, SpO2 96%.    Vent Mode: PRVC FiO2 (%):  [40 %] 40 % Set Rate:  [20 bmp] 20 bmp Vt Set:  [440 mL] 440 mL PEEP:  [5 cmH20] 5 cmH20 Plateau Pressure:  [10 cmH20-18 cmH20] 18 cmH20   Intake/Output Summary (Last 24 hours) at 02/29/2024 0736 Last data filed at 02/29/2024 9390 Gross per 24 hour  Intake 3862.97 ml  Output 650 ml  Net 3212.97 ml   Filed Weights   02/28/24 0200  Weight: 84.4 kg  Examination: General: intubated, lying in bed, NAD HENT: MMM Lungs: CTA Cardiovascular: RRR Abdomen: soft, nondistended Extremities: warm and well perfused  Resolved problem list   Assessment and Plan   Altered mental status Acute Encephalopathy Acute respiratory failure MRI Brain with multiple strokes. Prior EEG with encephalopathy. Ammonia 29. LA 1.2. - Neurology consulted - echocardiogram - continue fentanyl , propofol  - ventilator orders in place - consider trickle feeds - maintain MAP > 65 - SAT and SBT daily as appropriate   Severe sepsis concerns for UTI Initially on vanc, flagyl, cefepime now narrowed to only cefepime. Cultures with strep species in 3/4 bottles. Lactic acid  normalized. Hemodynamically stable at this time - consider levophed if needed to maintain MAP > 65. - continue cefepime (10/14 - ); consider switching to rocephin in future. - continue to follow blood cultures - continue to monitor urine output  Elevated CK Patient was down for unknown amount of time. CK 2418. - trend CK daily until resolved - LR 150 mL/h   Type 2 Diabetes Mellitus  A1c 7.0  - Maintain blood sugars between 140 to 180   VTE Prophylaxis - Lovenox  GI prophylaxis with Protonix. Disposition - ICU  Labs   CBC: Recent Labs  Lab 02/27/24 2130 02/27/24 2341 02/28/24 0603 02/29/24 0320  WBC 16.3*  --  12.3* 12.9*  NEUTROABS 13.9*  --   --   --   HGB 13.2 12.9 10.3* 11.1*  HCT 42.3 38.0 32.6* 35.9*  MCV 87.8  --  87.4 88.6  PLT 240  --  165 150    Basic Metabolic Panel: Recent Labs  Lab 02/27/24 2130 02/27/24 2341 02/28/24 0603 02/29/24 0320  NA 137 138  --  138  K 4.1 3.3*  --  3.4*  CL 100  --   --  103  CO2 24  --   --  24  GLUCOSE 228*  --   --  134*  BUN 16  --   --  26*  CREATININE 1.00  --  1.18* 1.37*  CALCIUM  9.3  --   --  8.1*  MG  --   --  1.5* 1.6*   GFR: Estimated Creatinine Clearance: 34.8 mL/min (A) (by C-G formula based on SCr of 1.37 mg/dL (H)). Recent Labs  Lab 02/27/24 2127 02/27/24 2130 02/27/24 2341 02/28/24 0603 02/29/24 0320  WBC  --  16.3*  --  12.3* 12.9*  LATICACIDVEN 4.1*  --  2.6*  --  1.2    Liver Function Tests: Recent Labs  Lab 02/27/24 2130  AST 62*  ALT 43  ALKPHOS 91  BILITOT 0.8  PROT 6.9  ALBUMIN 3.3*   No results for input(s): LIPASE, AMYLASE in the last 168 hours. Recent Labs  Lab 02/28/24 0603  AMMONIA 29    ABG    Component Value Date/Time   HCO3 27.5 02/27/2024 2341   TCO2 29 02/27/2024 2341   O2SAT 96 02/27/2024 2341     Coagulation Profile: Recent Labs  Lab 02/27/24 2130  INR 0.9    Cardiac Enzymes: Recent Labs  Lab 02/27/24 2130 02/29/24 0320  CKTOTAL 661*  2,418*    HbA1C: Hemoglobin A1C  Date/Time Value Ref Range Status  01/25/2023 12:50 PM 6.1 (A) 4.0 - 5.6 % Final  07/19/2022 01:20 PM 7.2 (A) 4.0 - 5.6 % Final   Hgb A1c MFr Bld  Date/Time Value Ref Range Status  02/28/2024 06:00 AM 7.0 (H) 4.8 - 5.6 % Final    Comment:    (  NOTE) Diagnosis of Diabetes The following HbA1c ranges recommended by the American Diabetes Association (ADA) may be used as an aid in the diagnosis of diabetes mellitus.  Hemoglobin             Suggested A1C NGSP%              Diagnosis  <5.7                   Non Diabetic  5.7-6.4                Pre-Diabetic  >6.4                   Diabetic  <7.0                   Glycemic control for                       adults with diabetes.    01/25/2021 12:08 PM 6.3 (H) 4.8 - 5.6 % Final    Comment:    (NOTE) Pre diabetes:          5.7%-6.4%  Diabetes:              >6.4%  Glycemic control for   <7.0% adults with diabetes     CBG: Recent Labs  Lab 02/28/24 1538 02/28/24 1920 02/29/24 0055 02/29/24 0311 02/29/24 0727  GLUCAP 128* 106* 137* 124* 135*    Review of Systems:   See HPI  Past Medical History:  She,  has a past medical history of Abdominal aneurysm, Anemia, Arthritis, Breast cancer in female [facility]), COPD (chronic obstructive pulmonary disease) (HCC), Dyspnea, Headache, Heart murmur, Hyperlipidemia, Hypertension, Multiple thyroid  nodules, Murmur, cardiac (1950), Osteoporosis, Pneumonia, Pre-diabetes, Requires continuous at home supplemental oxygen , Sebaceous cyst, Sleep apnea, and Varicella (as child).   Surgical History:   Past Surgical History:  Procedure Laterality Date   APPENDECTOMY     2008   BREAST LUMPECTOMY Left 08/31/2017   BREAST LUMPECTOMY WITH RADIOACTIVE SEED LOCALIZATION Left 08/10/2017   Procedure: BREAST LUMPECTOMY WITH RADIOACTIVE SEED LOCALIZATION;  Surgeon: Ebbie Cough, MD;  Location: [facility] OR;  Service: General;  Laterality: Left;   CESAREAN SECTION      1972   COLONOSCOPY     COLONOSCOPY WITH PROPOFOL  N/A 04/16/2016   Procedure: COLONOSCOPY WITH PROPOFOL ;  Surgeon: Belvie Just, MD;  Location: [facility] ENDOSCOPY;  Service: Endoscopy;  Laterality: N/A;   CYST REMOVAL NECK Left 04/13/2016   Procedure: EXCISION OF SEBACEOUS CYST LEFT POSTERIOR NECK;  Surgeon: Krystal Spinner, MD;  Location: [facility] OR;  Service: General;  Laterality: Left;   Excision of Pelvic Absess, Right Ovary     2008   RE-EXCISION OF BREAST CANCER,SUPERIOR MARGINS Left 08/31/2017   Procedure: RE-EXCISION OF LEFT  BREAST MARGINS ERAS PATHWAY;  Surgeon: Ebbie Cough, MD;  Location: [facility] OR;  Service: General;  Laterality: Left;   TUBAL LIGATION  1980     Social History:   reports that she quit smoking about 12 years ago. Her smoking use included cigarettes. She started smoking about 62 years ago. She has a 50 pack-year smoking history. She has never used smokeless tobacco. She reports current alcohol use. She reports that she does not use drugs.   Family History:  Her family history includes Alzheimer's disease in her brother and father; Breast cancer in her paternal grandmother; Diabetes in her brother; Heart disease in her mother; Hyperlipidemia in her brother and  brother; Hypertension in her brother, brother, and mother; Stroke (age of onset: 37) in her father.   Allergies Allergies  Allergen Reactions   Benicar  [Olmesartan ] Swelling    Swelling of face and arms    [name]  [Valsartan ] Swelling    Swelling of face and arms    Hydrocodone -Acetaminophen  Nausea And Vomiting    Severe vomiting   Lisinopril  Cough   Monosodium Glutamate Other (See Comments)    Facial swelling per pt   Codeine Other (See Comments)    jittery   Lead Acetate Rash   Nickel Rash    Severe rash to infection: pt is allergic to all metals other than sterling silver or gold jewelry.    Penicillins Other (See Comments)    Unknown reaction per family at the bedside     Home Medications  Prior to  Admission medications   Medication Sig Start Date End Date Taking? Authorizing Provider  Accu-Chek Softclix Lancets lancets Use as instructed to check blood sugar 1x daily 03/30/23   Shamleffer, Ibtehal Jaralla, MD  acetaminophen  (TYLENOL ) 500 MG tablet Take 1,000 mg by mouth every 6 (six) hours as needed.    [provider]  albuterol  (PROVENTIL ) (2.5 MG/3ML) 0.083% nebulizer solution Take 3 mLs (2.5 mg total) by nebulization every 6 (six) hours as needed for wheezing or shortness of breath. 12/31/21   Parrett, Madelin RAMAN, NP  Albuterol  Sulfate (PROAIR  RESPICLICK) 108 (90 Base) MCG/ACT AEPB Inhale 1-2 puffs into the lungs every 6 (six) hours as needed (for wheezing/shortness of breath). 03/31/20   Geronimo Amel, MD  amLODipine  (NORVASC ) 10 MG tablet TAKE ONE TABLET BY MOUTH ONCE DAILY 11/24/22   Burnard Debby LABOR, MD  aspirin  EC 81 MG tablet Take 1 tablet (81 mg total) by mouth daily. 07/23/19   Kroeger, Krista M., PA-C  Blood Glucose Monitoring Suppl (ACCU-CHEK AVIVA PLUS) w/Device KIT Use to monitor glucose levels once per day; E11.9 06/10/23   Shamleffer, Donell Cardinal, MD  carvedilol  (COREG ) 6.25 MG tablet TAKE ONE TABLET BY MOUTH EVERY MORNING and TAKE ONE TABLET BY MOUTH EVERYDAY AT BEDTIME 11/24/22   Burnard Debby LABOR, MD  cetirizine  (ZYRTEC ) 10 MG tablet Take 10 mg by mouth every morning. 12/28/23   [provider]  Cholecalciferol (VITAMIN D ) 50 MCG (2000 UT) tablet Take 2,000 Units by mouth daily. 02/07/24   [provider]  Cholecalciferol (VITAMIN D3) 125 MCG (5000 UT) CAPS TAKE ONE CAPSULE BY MOUTH ONCE DAILY 12/29/22   Joshua Debby CROME, MD  dapagliflozin  propanediol (FARXIGA ) 10 MG TABS tablet Take 1 tablet (10 mg total) by mouth daily. 09/20/23   Shamleffer, Ibtehal Jaralla, MD  denosumab  (PROLIA ) 60 MG/ML SOSY injection Inject 60 mg into the skin every 6 (six) months. 06/16/22   Joshua Debby CROME, MD  FEROSUL 325 (65 Fe) MG tablet Take 325 mg by mouth 3 (three) times a  week. 08/09/23   [provider]  Fluticasone -Umeclidin-Vilant (TRELEGY ELLIPTA ) 100-62.5-25 MCG/ACT AEPB INHALE ONE (1) PUFF BY MOUTH AND INTO THE LUNGS DAILY 12/12/23   Geronimo Amel, MD  glucose blood (ACCU-CHEK AVIVA PLUS) test strip USE TO Check blood glucose 1x DAILY 06/03/23   Shamleffer, Ibtehal Jaralla, MD  glucose blood (ACCU-CHEK GUIDE) test strip USE 1 STRIP ONCE DAILY 04/09/20   Kassie Mallick, MD  glucose blood test strip USE 1 STRIP ONCE DAILY 02/29/20   Kassie Mallick, MD  methocarbamol  (ROBAXIN ) 500 MG tablet Take 1 tablet (500 mg total) by mouth every 8 (eight) hours  as needed for muscle spasms. 08/19/22   Elnor Lauraine BRAVO, NP  OXYGEN  Inhale 2-3 L into the lungs continuous. When exerting self    [provider]  Potassium Chloride  ER 20 MEQ TBCR Take 1 tablet by mouth every morning. 01/07/23   [provider]  roflumilast  (DALIRESP ) 500 MCG TABS tablet TAKE ONE TABLET BY MOUTH EVERY MORNING 11/25/22   Geronimo Amel, MD  rosuvastatin  (CRESTOR ) 40 MG tablet TAKE ONE TABLET BY MOUTH EVERYDAY AT BEDTIME 11/24/22   Burnard Debby LABOR, MD  SitaGLIPtin -MetFORMIN  HCl (JANUMET  XR) 50-1000 MG TB24 Take 1 tablet by mouth daily before supper. 09/20/23   Shamleffer, Ibtehal Jaralla, MD  spironolactone  (ALDACTONE ) 25 MG tablet TAKE ONE TABLET BY MOUTH ONCE DAILY 11/24/22   Burnard Debby LABOR, MD  thiamine  (VITAMIN B1) 100 MG tablet TAKE 1 TABLET BY MOUTH EVERY OTHER DAY 01/20/23   Joshua Debby CROME, MD     Lauraine Norse, DO [facility], PGY-2 02/29/24 7:36 AM

## 2024-02-29 NOTE — Progress Notes (Signed)
 Pt transported from [id number] to MRI and back without any complications. Vent reconnected to wall air and oxygen  supply and plugged into a red outlet. Pt resting comfortably on current vent settings.

## 2024-02-29 NOTE — Consult Note (Signed)
 NEUROLOGY CONSULT NOTE   Date of service: February 29, 2024 Patient Name: Victoria [name] MRN:  [id number] DOB:  Feb 17, 1946 Chief Complaint: AMS, fever and multiple embolic infarcts Requesting Provider: Catherine Cools, MD  History of Present Illness  Victoria [name] is a 78 y.o. female with hx of breast cancer (s/p left lumpectomy 2019), COPD, OSA, CKD, HTN, HLD, DM2 who presented after being found down, encephalopathic and with fever. Neurology was consulted for lumbar puncture and stroke work-up.  LKN on 10/10. After that, spoke to family on the phone and was noted to sound a little confused. On 10/13 was found by EMS on the floor and confused, HR 120s, febrile 102.56F, RR 30s. Upon presentation, WBC 16 & lactate 4.1. Currently no infectious source identified (CXR neg, UA neg, CT C/A/P neg, and blood cultures pending). She has since been placed on antibiotics and primary team is requesting LP.  She was ultimately intubated for lethargy and respiratory distress. Remains sedated due to agitation.  Primary team then found on MRI brain w/wo contrast which revealed multiple areas of foci of restricted diffusion bilaterally & in multiple vascular territories, mostly in the R MCA territory.     ROS  Unable to ascertain due to mental status  Past History   Past Medical History:  Diagnosis Date   Abdominal aneurysm    Dr. Harvey follows lLOV 2 ''17 per pt around 2 cm   Anemia    as a child   Arthritis    left ankle, right knee, right SI joint, wrists, lower back   Breast cancer in female [facility])    Left   COPD (chronic obstructive pulmonary disease) (HCC)    ephysema-Dr. Geronimo   Dyspnea    Headache    as a child would have terrible headaches during season changes   Heart murmur    congenital, 2 D echo '10   Hyperlipidemia    Hypertension    Multiple thyroid  nodules    Murmur, cardiac 1950   Osteoporosis    Pneumonia    Pre-diabetes    Requires continuous at home  supplemental oxygen     2 L 24/7   Sebaceous cyst    hairline sebaceous cyst left posterior neck to be excised 04-13-16 by Dr. Eletha in [place]   Sleep apnea    cpap used sometimes, uses oxygen  concentrator 2 l/m nasally bedtime   Varicella as child    Past Surgical History:  Procedure Laterality Date   APPENDECTOMY     2008   BREAST LUMPECTOMY Left 08/31/2017   BREAST LUMPECTOMY WITH RADIOACTIVE SEED LOCALIZATION Left 08/10/2017   Procedure: BREAST LUMPECTOMY WITH RADIOACTIVE SEED LOCALIZATION;  Surgeon: Ebbie Cough, MD;  Location: [facility] OR;  Service: General;  Laterality: Left;   CESAREAN SECTION     1972   COLONOSCOPY     COLONOSCOPY WITH PROPOFOL  N/A 04/16/2016   Procedure: COLONOSCOPY WITH PROPOFOL ;  Surgeon: Belvie Just, MD;  Location: [facility]COPY;  Service: Endoscopy;  Laterality: N/A;   CYST REMOVAL NECK Left 04/13/2016   Procedure: EXCISION OF SEBACEOUS CYST LEFT POSTERIOR NECK;  Surgeon: Krystal Eletha, MD;  Location: [facility] OR;  Service: General;  Laterality: Left;   Excision of Pelvic Absess, Right Ovary     2008   RE-EXCISION OF BREAST CANCER,SUPERIOR MARGINS Left 08/31/2017   Procedure: RE-EXCISION OF LEFT  BREAST MARGINS ERAS PATHWAY;  Surgeon: Ebbie Cough, MD;  Location: [facility] OR;  Service: General;  Laterality: Left;   TUBAL  LIGATION  1980    Family History: Family History  Problem Relation Age of Onset   Heart disease Mother        MI - fatal   Hypertension Mother    Stroke Father 84       fatal   Alzheimer's disease Father    Alzheimer's disease Brother    Hyperlipidemia Brother    Hypertension Brother    Diabetes Brother    Hypertension Brother    Hyperlipidemia Brother    Breast cancer Paternal Grandmother     Social History  reports that she quit smoking about 12 years ago. Her smoking use included cigarettes. She started smoking about 62 years ago. She has a 50 pack-year smoking history. She has never used smokeless tobacco. She reports  current alcohol use. She reports that she does not use drugs.  Allergies  Allergen Reactions   Benicar  [Olmesartan ] Swelling    Swelling of face and arms    [name]  [Valsartan ] Swelling    Swelling of face and arms    Hydrocodone -Acetaminophen  Nausea And Vomiting    Severe vomiting   Lisinopril  Cough   Monosodium Glutamate Other (See Comments)    Facial swelling per pt   Codeine Other (See Comments)    jittery   Lead Acetate Rash   Nickel Rash    Severe rash to infection: pt is allergic to all metals other than sterling silver or gold jewelry.    Penicillins Other (See Comments)    Unknown reaction per family at the bedside    Medications   Current Facility-Administered Medications:    0.9 %  sodium chloride  infusion, 250 mL, Intravenous, Continuous, Alghanim, Fahid, MD, Stopped at 02/29/24 0015   acetaminophen  (TYLENOL ) tablet 650 mg, 650 mg, Per Tube, Q4H PRN, Payne, John D, PA-C   arformoterol  (BROVANA ) nebulizer solution 15 mcg, 15 mcg, Nebulization, Q12H, Payne, John D, PA-C, 15 mcg at 02/29/24 9158   aspirin  chewable tablet 81 mg, 81 mg, Per Tube, Daily, Payne, John D, PA-C, 81 mg at 02/29/24 9089   budesonide  (PULMICORT ) nebulizer solution 0.5 mg, 0.5 mg, Nebulization, BID, Payne, John D, PA-C, 0.5 mg at 02/29/24 0841   cefTRIAXone (ROCEPHIN) 2 g in sodium chloride  0.9 % 100 mL IVPB, 2 g, Intravenous, Q12H, Alghanim, Fahid, MD   Chlorhexidine  Gluconate Cloth 2 % PADS 6 each, 6 each, Topical, Daily, Payne, John D, PA-C, 6 each at 02/28/24 2237   docusate (COLACE) 50 MG/5ML liquid 100 mg, 100 mg, Per Tube, BID, Payne, John D, PA-C, 100 mg at 02/29/24 0911   docusate (COLACE) 50 MG/5ML liquid 100 mg, 100 mg, Per Tube, BID PRN, Laron Agent, RPH   fentaNYL  (SUBLIMAZE ) bolus via infusion 25-100 mcg, 25-100 mcg, Intravenous, Q15 min PRN, Payne, John D, PA-C, 100 mcg at 02/29/24 1515   fentaNYL  in NS (46mcg/ml) infusion-PREMIX, 0-400 mcg/hr, Intravenous, Continuous,  Emilio Norleen BIRCH, PA-C, Last Rate: 10 mL/hr at 02/29/24 1800, 100 mcg/hr at 02/29/24 1800   heparin  injection 5,000 Units, 5,000 Units, Subcutaneous, Q8H, Alghanim, Fahid, MD   insulin  aspart (novoLOG ) injection 0-15 Units, 0-15 Units, Subcutaneous, Q4H, Payne, John D, PA-C, 2 Units at 02/29/24 1214   ipratropium-albuterol  (DUONEB) 0.5-2.5 (3) MG/3ML nebulizer solution 3 mL, 3 mL, Nebulization, Q2H PRN, Payne, John D, PA-C   lactated ringers  infusion, , Intravenous, Continuous, Alghanim, Fahid, MD, Last Rate: 150 mL/hr at 02/29/24 1800, Infusion Verify at 02/29/24 1800   norepinephrine (LEVOPHED) 4mg  in (0.016 mg/mL) premix infusion,  0-10 mcg/min, Intravenous, Titrated, Alghanim, Fahid, MD, Last Rate: 26.3 mL/hr at 02/29/24 1800, 7 mcg/min at 02/29/24 1800   Oral care mouth rinse, 15 mL, Mouth Rinse, Q2H, Alghanim, Fahid, MD, 15 mL at 02/29/24 1702   Oral care mouth rinse, 15 mL, Mouth Rinse, PRN, Alghanim, Fahid, MD   pantoprazole (PROTONIX) injection 40 mg, 40 mg, Intravenous, Q24H, Mohammed, Shahid, MD, 40 mg at 02/29/24 0910   polyethylene glycol (MIRALAX / GLYCOLAX) packet 17 g, 17 g, Per Tube, Daily PRN, Payne, John D, PA-C   polyethylene glycol (MIRALAX / GLYCOLAX) packet 17 g, 17 g, Per Tube, Daily, Emilio Norleen BIRCH, PA-C, 17 g at 02/29/24 0909   propofol  (DIPRIVAN ) 1000 MG/100ML infusion, 0-80 mcg/kg/min, Intravenous, Continuous, Emilio, John D, PA-C, Last Rate: 20.3 mL/hr at 02/29/24 1910, 40 mcg/kg/min at 02/29/24 1910   revefenacin  (YUPELRI ) nebulizer solution 175 mcg, 175 mcg, Nebulization, Daily, Payne, John D, PA-C, 175 mcg at 02/29/24 9158   roflumilast  (DALIRESP ) tablet 500 mcg, 500 mcg, Per Tube, q morning, Emilio Norleen BIRCH, PA-C, 500 mcg at 02/29/24 9090   thiamine  (VITAMIN B1) injection 100 mg, 100 mg, Intravenous, Daily, Emilio Norleen BIRCH, PA-C, 100 mg at 02/29/24 9090  Vitals   Vitals:   02/29/24 1815 02/29/24 1830 02/29/24 1845 02/29/24 1900  BP: (!) 108/42 (!) 109/47 (!) 111/46  (!) 114/45  Pulse: 73 70 70 78  Resp: 20 20 20 20   Temp: 99 F (37.2 C) 99.1 F (37.3 C) 99.1 F (37.3 C) 99.1 F (37.3 C)  TempSrc:      SpO2: 98% 97% 98% 95%  Weight:      Height:        Body mass index is 31.43 kg/m.   Physical Exam   Constitutional: Appears obese, sedated, in no acute distress. Psych: Sedated Eyes: No scleral injection.  HENT: Intubated Head: Normocephalic.  Cardiovascular: Normal rate and regular rhythm.  Respiratory: Ventilated GI: Soft.   Skin: WDI.   Neurologic Examination   Mental status: Sedated Speech: Intubated Cranial nerves: PERRL Corneals intact Cough & gag intact Motor: Deferred Sensory: Deferred Reflexes: Deferred Coordination: Deferred Gait:  Deferred   Labs/Imaging/Neurodiagnostic studies   CBC:  Recent Labs  Lab 03/01/24 2130 01-Mar-2024 2341 02/28/24 0603 02/29/24 0320  WBC 16.3*  --  12.3* 12.9*  NEUTROABS 13.9*  --   --   --   HGB 13.2   < > 10.3* 11.1*  HCT 42.3   < > 32.6* 35.9*  MCV 87.8  --  87.4 88.6  PLT 240  --  165 150   < > = values in this interval not displayed.   Basic Metabolic Panel:  Lab Results  Component Value Date   NA 138 02/29/2024   K 3.4 (L) 02/29/2024   CO2 24 02/29/2024   GLUCOSE 134 (H) 02/29/2024   BUN 26 (H) 02/29/2024   CREATININE 1.37 (H) 02/29/2024   CALCIUM  8.1 (L) 02/29/2024   GFRNONAA 39 (L) 02/29/2024   GFRAA 92 12/24/2019   Lipid Panel:  Lab Results  Component Value Date   LDLCALC 40 11/10/2021   HgbA1c:  Lab Results  Component Value Date   HGBA1C 7.0 (H) 02/28/2024   Urine Drug Screen:     Component Value Date/Time   LABOPIA NONE DETECTED 01/25/2021 1228   COCAINSCRNUR NONE DETECTED 01/25/2021 1228   LABBENZ NONE DETECTED 01/25/2021 1228   AMPHETMU NONE DETECTED 01/25/2021 1228   THCU NONE DETECTED 01/25/2021 1228   LABBARB NONE DETECTED 01/25/2021  1228    Alcohol Level     Component Value Date/Time   [facility] <15 02/28/2024 0603   INR  Lab  Results  Component Value Date   INR 0.9 02/27/2024   APTT  Lab Results  Component Value Date   APTT 29 01/25/2021   CT Head without contrast (02/27/24, Personally reviewed): No acute abnormality.  MR Angio head without contrast (02/29/24, Personally reviewed): No aneurysm, dissection, significant stenosis.  MRI Brain(Personally reviewed): Multiple areas of restricted diffusion & ADC correlate including the R temporal lobe and other punctate foci in the right temporal, left lenticulostriate, bilateral posterior parietal, bilateral frontal regions. These are non-enhancing.  CSF (02/29/24): Tube 1: 3 RBC, 1 WBC Tube 4: 1 RBC, 6 WBC Protein 53 Glucose 87 Meningitis/encephalitis panel neg Gram stain neg  TTE (02/29/24): Technically difficult study. No vegetations visualized.   ASSESSMENT   Victoria [name] is a 78 y.o. female with hx of breast cancer (s/p left lumpectomy 2019), COPD, OSA, CKD, HTN, HLD, DM2 who presented after being found down, encephalopathic and with fever. Neurology was consulted for lumbar puncture and stroke work-up.   Initial CSF results not infectious. Fever so far unexplained, however seems to have been readily controlled with ceftriaxone. Encephalopathy can occur when there are bilateral cortical insults, however these are punctate. Of note the right temporal lobe has a larger infarct. Seizure could possibly have resulted.   RECOMMENDATIONS   - follow-up CSF: cytology - consider TEE - carotid US   - ASA 81mg  - atorvastatin  20mg  - A1C 7.0 - lipid panel ordered - consider EEG (best if off sedation) - further stroke recommendations to follow from Stroke Team  ______________________________________________________________________    Signed, Normie CHRISTELLA Blower, MD [facility]

## 2024-02-29 NOTE — Progress Notes (Signed)
 Pt was transported to MRI via ventilator with no apparent complications

## 2024-03-01 ENCOUNTER — Inpatient Hospital Stay (HOSPITAL_COMMUNITY)

## 2024-03-01 DIAGNOSIS — R29732 NIHSS score 32: Secondary | ICD-10-CM | POA: Diagnosis not present

## 2024-03-01 DIAGNOSIS — G9340 Encephalopathy, unspecified: Secondary | ICD-10-CM | POA: Diagnosis not present

## 2024-03-01 DIAGNOSIS — E119 Type 2 diabetes mellitus without complications: Secondary | ICD-10-CM | POA: Diagnosis not present

## 2024-03-01 DIAGNOSIS — M6282 Rhabdomyolysis: Secondary | ICD-10-CM | POA: Diagnosis not present

## 2024-03-01 DIAGNOSIS — Z7982 Long term (current) use of aspirin: Secondary | ICD-10-CM

## 2024-03-01 DIAGNOSIS — G9341 Metabolic encephalopathy: Secondary | ICD-10-CM | POA: Diagnosis not present

## 2024-03-01 DIAGNOSIS — B955 Unspecified streptococcus as the cause of diseases classified elsewhere: Secondary | ICD-10-CM | POA: Diagnosis not present

## 2024-03-01 DIAGNOSIS — I6340 Cerebral infarction due to embolism of unspecified cerebral artery: Secondary | ICD-10-CM | POA: Diagnosis not present

## 2024-03-01 DIAGNOSIS — N179 Acute kidney failure, unspecified: Secondary | ICD-10-CM | POA: Diagnosis not present

## 2024-03-01 DIAGNOSIS — I69391 Dysphagia following cerebral infarction: Secondary | ICD-10-CM

## 2024-03-01 LAB — CULTURE, BLOOD (ROUTINE X 2)
Special Requests: ADEQUATE
Special Requests: ADEQUATE

## 2024-03-01 LAB — BASIC METABOLIC PANEL WITH GFR
Anion gap: 9 (ref 5–15)
BUN: 30 mg/dL — ABNORMAL HIGH (ref 8–23)
CO2: 24 mmol/L (ref 22–32)
Calcium: 7.8 mg/dL — ABNORMAL LOW (ref 8.9–10.3)
Chloride: 102 mmol/L (ref 98–111)
Creatinine, Ser: 1.1 mg/dL — ABNORMAL HIGH (ref 0.44–1.00)
GFR, Estimated: 51 mL/min — ABNORMAL LOW (ref >60)
Glucose, Bld: 121 mg/dL — ABNORMAL HIGH (ref 70–99)
Potassium: 4.2 mmol/L (ref 3.5–5.1)
Sodium: 135 mmol/L (ref 135–145)

## 2024-03-01 LAB — GLUCOSE, CAPILLARY
Glucose-Capillary: 110 mg/dL — ABNORMAL HIGH (ref 70–99)
Glucose-Capillary: 130 mg/dL — ABNORMAL HIGH (ref 70–99)
Glucose-Capillary: 152 mg/dL — ABNORMAL HIGH (ref 70–99)
Glucose-Capillary: 162 mg/dL — ABNORMAL HIGH (ref 70–99)
Glucose-Capillary: 161 mg/dL — ABNORMAL HIGH (ref 70–99)
Glucose-Capillary: 158 mg/dL — ABNORMAL HIGH (ref 70–99)

## 2024-03-01 LAB — CBC
HCT: 33.8 % — ABNORMAL LOW (ref 36.0–46.0)
Hemoglobin: 10.5 g/dL — ABNORMAL LOW (ref 12.0–15.0)
MCH: 27.9 pg (ref 26.0–34.0)
MCHC: 31.1 g/dL (ref 30.0–36.0)
MCV: 89.7 fL (ref 80.0–100.0)
Platelets: 132 K/uL — ABNORMAL LOW (ref 150–400)
RBC: 3.77 MIL/uL — ABNORMAL LOW (ref 3.87–5.11)
RDW: 15.6 % — ABNORMAL HIGH (ref 11.5–15.5)
WBC: 12.3 K/uL — ABNORMAL HIGH (ref 4.0–10.5)
nRBC: 0 % (ref 0.0–0.2)

## 2024-03-01 LAB — LIPID PANEL
Cholesterol: 163 mg/dL (ref 0–200)
HDL: 52 mg/dL (ref >40)
LDL Cholesterol: 78 mg/dL (ref 0–99)
Total CHOL/HDL Ratio: 3.1 ratio
Triglycerides: 166 mg/dL — ABNORMAL HIGH (ref <150)
VLDL: 33 mg/dL (ref 0–40)

## 2024-03-01 LAB — CK: Total CK: 1638 U/L — ABNORMAL HIGH (ref 38–234)

## 2024-03-01 LAB — MAGNESIUM
Magnesium: 2.3 mg/dL (ref 1.7–2.4)
Magnesium: 2.4 mg/dL (ref 1.7–2.4)

## 2024-03-01 LAB — VZV PCR, CSF: VZV PCR, CSF: NEGATIVE

## 2024-03-01 LAB — VDRL, CSF: VDRL Quant, CSF: NONREACTIVE

## 2024-03-01 LAB — PHOSPHORUS: Phosphorus: 3.5 mg/dL (ref 2.5–4.6)

## 2024-03-01 MED ORDER — LACTATED RINGERS IV SOLN: INTRAVENOUS | Status: DC

## 2024-03-01 MED ORDER — DEXMEDETOMIDINE HCL IN NACL 400 MCG/100ML IV SOLN
0.0000 ug/kg/h | INTRAVENOUS | Status: DC
  Administered 2024-03-01: 0.4 ug/kg/h via INTRAVENOUS
  Administered 2024-03-01: 1 ug/kg/h via INTRAVENOUS
  Administered 2024-03-01: 1.1 ug/kg/h via INTRAVENOUS
  Administered 2024-03-01: 1 ug/kg/h via INTRAVENOUS
  Administered 2024-03-02: 0.9 ug/kg/h via INTRAVENOUS
  Administered 2024-03-02: 1 ug/kg/h via INTRAVENOUS
  Administered 2024-03-02: 0.7 ug/kg/h via INTRAVENOUS
  Administered 2024-03-02: 0.3 ug/kg/h via INTRAVENOUS
  Administered 2024-03-02: 0.9 ug/kg/h via INTRAVENOUS
  Filled 2024-03-01 (×9): qty 100

## 2024-03-01 MED ORDER — FENTANYL CITRATE (PF) 50 MCG/ML IJ SOSY
50.0000 ug | PREFILLED_SYRINGE | INTRAMUSCULAR | Status: DC | PRN
  Administered 2024-03-01 – 2024-03-02 (×6): 100 ug via INTRAVENOUS
  Administered 2024-03-02: 150 ug via INTRAVENOUS
  Filled 2024-03-01 (×5): qty 2

## 2024-03-01 MED ORDER — OSMOLITE 1.5 CAL PO LIQD
1000.0000 mL | ORAL | Status: DC
  Administered 2024-03-01 – 2024-03-10 (×10): 1000 mL
  Filled 2024-03-01: qty 1000

## 2024-03-01 MED ORDER — FENTANYL CITRATE (PF) 50 MCG/ML IJ SOSY
50.0000 ug | PREFILLED_SYRINGE | INTRAMUSCULAR | Status: DC | PRN
  Administered 2024-03-01 – 2024-03-02 (×2): 50 ug via INTRAVENOUS
  Filled 2024-03-01 (×2): qty 1

## 2024-03-01 MED ORDER — OXYCODONE HCL 5 MG PO TABS
5.0000 mg | ORAL_TABLET | Freq: Four times a day (QID) | ORAL | Status: DC
  Administered 2024-03-01 – 2024-03-03 (×8): 5 mg
  Filled 2024-03-01 (×9): qty 1

## 2024-03-01 MED ORDER — ENOXAPARIN SODIUM 40 MG/0.4ML IJ SOSY
40.0000 mg | PREFILLED_SYRINGE | INTRAMUSCULAR | Status: DC
  Administered 2024-03-01 – 2024-03-10 (×10): 40 mg via SUBCUTANEOUS
  Filled 2024-03-01 (×10): qty 0.4

## 2024-03-01 MED ORDER — LACTATED RINGERS IV BOLUS
500.0000 mL | Freq: Once | INTRAVENOUS | Status: AC
  Administered 2024-03-01: 500 mL via INTRAVENOUS

## 2024-03-01 MED ORDER — IOHEXOL 350 MG/ML SOLN
75.0000 mL | Freq: Once | INTRAVENOUS | Status: AC | PRN
  Administered 2024-03-01: 75 mL via INTRAVENOUS

## 2024-03-01 MED ORDER — PROSOURCE TF20 ENFIT COMPATIBL EN LIQD
60.0000 mL | Freq: Every day | ENTERAL | Status: DC
  Administered 2024-03-01: 60 mL
  Filled 2024-03-01: qty 60

## 2024-03-01 MED ORDER — VITAL HP 1.0 CAL PO LIQD: 1000.0000 mL | ORAL | Status: DC

## 2024-03-01 NOTE — Progress Notes (Signed)
 Initial Nutrition Assessment  DOCUMENTATION CODES:   Obesity unspecified  INTERVENTION:  Initiate tube feeding via OG: initiate at 74ml/h and increase 10 ml q8h until goal rate is reached Osmolite 1.5 at 40 ml/h (960 ml per day) Provides 1440 kcal, 60 gm protein, 731 ml free water daily  Pt previously had issues with electrolyte abnormalities consistent with refeeding syndrome, continue to monitor electrolytes until stable and continue thiamine  100 mg supplementation daily   NUTRITION DIAGNOSIS:   Inadequate oral intake related to inability to eat as evidenced by NPO status.  GOAL:   Patient will meet greater than or equal to 90% of their needs  MONITOR:   Labs, Vent status, TF tolerance  REASON FOR ASSESSMENT:   Consult Enteral/tube feeding initiation and management  ASSESSMENT:   Pt admitted after being found down. Found to have multiple embolic strokes and strep bacteremia. PMH significant for COPD, HTN, HLD, T2DM, CKD stage IIIa.  Discussed pt with careteam who recommend initiating tube feeds today. Pt previously had issues with electrolyte abnormalities requiring repletion, will continue to monitor and continue thiamine  supplementation. Beginning feeds at a lower rate and titrating slowly, regimen discussed with RN. No visitors at bedside, unable to obtain diet hx.  Nutrition focused physical exam shows no indications of malnutrition however unable to assess multiple fat and muscle areas. At this time, pt does not meet criteria for malnutrition.   Pt had not had bowel movement and abdomen feels distended, currently on bowel regimen to initiate first movement since admission.  Patient is currently intubated on ventilator support MV: 8.7 L/min Temp (24hrs), Avg:98.8 F (37.1 C), Min:98.4 F (36.9 C), Max:99.5 F (37.5 C) MAP (cuff):  Propofol : 7.6 ml/hr *provides additional 200 kcal per day at current rate*  Admit weight: 84.4 kg  Current weight: 98.1  kg    Intake/Output Summary (Last 24 hours) at 03/01/2024 1010 Last data filed at 03/01/2024 0800 Gross per 24 hour  Intake 4222.58 ml  Output 409 ml  Net 3813.58 ml   Net IO Since Admission: 7,399.45 mL [03/01/24 1010]  Drains/Lines: OG tube gastric per xray Urethral Catheter: 485 ml x 24 h  Nutritionally Relevant Medications: Scheduled Meds:  docusate  100 mg Per Tube BID   feeding supplement (PROSource TF20)  60 mL Per Tube Daily   insulin  aspart  0-15 Units Subcutaneous Q4H   pantoprazole (PROTONIX) IV  40 mg Intravenous Q24H   polyethylene glycol  17 g Per Tube Daily   thiamine  (VITAMIN B1) injection  100 mg Intravenous Daily   Continuous Infusions:  cefTRIAXone (ROCEPHIN)  IV 2 g (03/01/24 0929)   dexmedetomidine (PRECEDEX) IV infusion 0.4 mcg/kg/hr (03/01/24 0932)   lactated ringers  150 mL/hr at 03/01/24 0936   norepinephrine (LEVOPHED) Adult infusion Stopped (03/01/24 0300)    Labs Reviewed: Potassium 4.2<--3.4<--3.3 Magnesium 2.3<--1.6<--1.5 BUN 30/Cr 1.10 CBG ranges from 109-135 mg/dL over the last 24 hours HgbA1c 7.0    NUTRITION - FOCUSED PHYSICAL EXAM:  Flowsheet Row Most Recent Value  Orbital Region Unable to assess  Upper Arm Region No depletion  Thoracic and Lumbar Region No depletion  Buccal Region Unable to assess  Temple Region Mild depletion  Clavicle Bone Region No depletion  Clavicle and Acromion Bone Region No depletion  Scapular Bone Region No depletion  Dorsal Hand Unable to assess  Patellar Region Unable to assess  [edema]  Anterior Thigh Region Unable to assess  [edema]  Posterior Calf Region Unable to assess  [edema]  Edema (  RD Assessment) Mild  [BLE]  Hair Reviewed  Eyes Unable to assess  Mouth Unable to assess  Skin Reviewed  Nails Unable to assess    Diet Order:   Diet Order             Diet NPO time specified  Diet effective now                   EDUCATION NEEDS:   Not appropriate for education at this  time  Skin:  Skin Assessment: Reviewed RN Assessment  Last BM:  unknown  Height:   Ht Readings from Last 1 Encounters:  02/28/24 5' 4.5 (1.638 m)    Weight:   Wt Readings from Last 1 Encounters:  03/01/24 98.1 kg    Ideal Body Weight:  55.7 kg  BMI:  Body mass index is 36.55 kg/m.  Estimated Nutritional Needs:   Kcal:  1300-1600  Protein:  60-85g  Fluid:  1.3-1.6L    Josette Glance, MS, RDN, LDN Clinical Dietitian I Please reach out via secure chat

## 2024-03-01 NOTE — Progress Notes (Signed)
 NAME:  Victoria [name], MRN:  [id number], DOB:  1945/07/22, LOS: 2 ADMISSION DATE:  02/27/2024, CONSULTATION DATE:  02/28/24 REFERRING MD:  Haze, CHIEF COMPLAINT:  Acute respiratory failure   History of Present Illness:  Patient is an 78 yo F w/ pertinent PMH COPD on 2L [place] at home, OSA, HTN, HLD, T2DM, CKD 3a presents to [facility] on 10/14 w/ AMS found to have multiple embolic strokes and strep bacteremia, requiring invasive mechanical ventilation.   [name] LKN 10/10. Patient found on 10/13 on floor confused by EMS, brought to [facility] ED. Patient arrived to ED meeting SIRS criteria, became lethargic with increased respiratory distress requiring intubation.    Lines/Tubes: Intubated, NG tube, urethral catheter, PIV x3  Pertinent  Medical History   Past Medical History:  Diagnosis Date   Abdominal aneurysm    Dr. Harvey follows lLOV 2 ''17 per pt around 2 cm   Anemia    as a child   Arthritis    left ankle, right knee, right SI joint, wrists, lower back   Breast cancer in female [facility])    Left   COPD (chronic obstructive pulmonary disease) (HCC)    ephysema-Dr. 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 Eletha in [place]   Sleep apnea    cpap used sometimes, uses oxygen  concentrator 2 l/m nasally bedtime   Varicella as child     Significant Hospital Events: Including procedures, antibiotic start and stop dates in addition to other pertinent events     Interim History / Subjective:  Overnight: Afebrile. SBP 110-20s, DBP 40-50s, maintained MAP >65. BG stable.   Vent: 40%Fio2,  PEEP 5, 440vt set  I/O in last 24 hr:  40.4 ml/kg IV 485 ml (0.2 mg/kg/hr) UOP +6.8 L since admit +3.6 in last 24h  TEE: cannot rule out SOE, LVEF 60-65%, G1DD MRA Head: Normal  CSF non-infectious, cytology: WBC 6, RBC 1, clear CK: 7581>8361 CBC: 12.9>12.3, Hgb 11.1>10.5 Cr: 1.37>1.1  Objective    Blood pressure (!) 113/46, pulse 67, temperature 98.4 F (36.9 C), resp. rate 20, height 5' 4.5 (1.638 m), weight 98.1 kg, SpO2 96%.    Vent Mode: PRVC FiO2 (%):  [40 %] 40 % Set Rate:  [20 bmp] 20 bmp Vt Set:  [440 mL] 440 mL PEEP:  [5 cmH20] 5 cmH20 Plateau Pressure:  [14 cmH20-15 cmH20] 15 cmH20   Intake/Output Summary (Last 24 hours) at 03/01/2024 0714 Last data filed at 03/01/2024 0500 Gross per 24 hour  Intake 2931.44 ml  Output 485 ml  Net 2446.44 ml   Filed Weights   02/28/24 0200 03/01/24 0500  Weight: 84.4 kg 98.1 kg    Examination: General: No acute distress.  CV: Normal S1/S2. No extra heart sounds. Warm and well-perfused. Pulm: On ventilator. Equal chest rise and fall.  Abd: NG tube in place. Soft, non-distended. Skin:  Warm, dry.  Resolved problem list   Assessment and Plan    #Acute metabolic encephalopathy #  Strep bacteremia  TTE unremarkable, no source of embolism identified. Brain MRA normal. CSF non-infectious. S/p cefepime, vanc, flagyl. CBC stable, Mg 1.6 > 2.3, lipid unremarkable. Afebrile overnight.  - Cont CTX 2mg  Q12 - Repeat blood cultures : NG<24h - Monitor UOP   #Rhabdomyolysis  CK improving 2418 > 1638 today  -Cont LR 150 ml/hr   #AKI Cr 1.37 > 1.1 -CTM -Foley -Strict I/O  #Stress Ulcer -PPI  -Heparin  PPX  #DM -Maintain BG 140-180 -Mod SSI  #PADs -Propofol  -Fentanyl      Labs   CBC: Recent Labs  Lab 02/27/24 2130 02/27/24 2341 02/28/24 0603 02/29/24 0320 03/01/24 0444  WBC 16.3*  --  12.3* 12.9* 12.3*  NEUTROABS 13.9*  --   --   --   --   HGB 13.2 12.9 10.3* 11.1* 10.5*  HCT 42.3 38.0 32.6* 35.9* 33.8*   MCV 87.8  --  87.4 88.6 89.7  PLT 240  --  165 150 132*    Basic Metabolic Panel: Recent Labs  Lab 02/27/24 2130 02/27/24 2341 02/28/24 0603 02/29/24 0320 03/01/24 0444  NA 137 138  --  138 135  K 4.1 3.3*  --  3.4* 4.2  CL 100  --   --  103 102  CO2 24  --   --  24 24  GLUCOSE 228*  --   --  134* 121*  BUN 16  --   --  26* 30*  CREATININE 1.00  --  1.18* 1.37* 1.10*  CALCIUM  9.3  --   --  8.1* 7.8*  MG  --   --  1.5* 1.6* 2.3   GFR: Estimated Creatinine Clearance: 46.9 mL/min (A) (by C-G formula based on SCr of 1.1 mg/dL (H)). Recent Labs  Lab 02/27/24 2127 02/27/24 2130 02/27/24 2341 02/28/24 0603 02/29/24 0320 03/01/24 0444  WBC  --  16.3*  --  12.3* 12.9* 12.3*  LATICACIDVEN 4.1*  --  2.6*  --  1.2  --     Liver Function Tests: Recent Labs  Lab 02/27/24 2130  AST 62*  ALT 43  ALKPHOS 91  BILITOT 0.8  PROT 6.9  ALBUMIN 3.3*   No results for input(s): LIPASE, AMYLASE in the last 168 hours. Recent Labs  Lab 02/28/24 0603  AMMONIA 29    ABG    Component Value Date/Time   HCO3 27.5 02/27/2024 2341   TCO2 29 02/27/2024 2341   O2SAT 96 02/27/2024 2341     Coagulation Profile: Recent Labs  Lab 02/27/24 2130  INR 0.9    Cardiac Enzymes: Recent Labs  Lab 02/27/24 2130 02/29/24 0320 03/01/24 0444  CKTOTAL 661* 2,418* 1,638*    HbA1C: Hemoglobin A1C  Date/Time Value Ref Range Status  01/25/2023 12:50 PM 6.1 (A) 4.0 - 5.6 % Final  07/19/2022 01:20 PM 7.2 (A) 4.0 - 5.6 % Final   Hgb A1c MFr Bld  Date/Time Value Ref Range Status  02/28/2024 06:00 AM 7.0 (H) 4.8 - 5.6 % Final    Comment:    (NOTE) Diagnosis of Diabetes The following HbA1c ranges recommended by the American Diabetes Association (ADA) may be used as an aid in the diagnosis of diabetes mellitus.  Hemoglobin             Suggested A1C NGSP%              Diagnosis  <5.7                   Non Diabetic  5.7-6.4                Pre-Diabetic  >6.4                    Diabetic  <7.0                   Glycemic control for                       adults with diabetes.    01/25/2021 12:08 PM 6.3 (H) 4.8 - 5.6 % Final    Comment:    (NOTE) Pre diabetes:          5.7%-6.4%  Diabetes:              >6.4%  Glycemic control for   <7.0% adults with diabetes     CBG: Recent Labs  Lab 02/29/24 1210 02/29/24 1545 02/29/24 1942 02/29/24 2336 03/01/24 0331  GLUCAP 124* 115* 135* 109* 110*    Review of Systems:   N/a  Past Medical History:  She,  has a past medical history of Abdominal aneurysm, Anemia, Arthritis, Breast cancer in female [facility] COPD (chronic obstructive pulmonary disease) (HCC), Dyspnea, Headache, Heart murmur, Hyperlipidemia, Hypertension, Multiple thyroid  nodules, Murmur, cardiac (1950), Osteoporosis, Pneumonia, Pre-diabetes, Requires continuous at home supplemental oxygen , Sebaceous cyst, Sleep apnea, and Varicella (as child).   Surgical History:   Past Surgical History:  Procedure Laterality Date   APPENDECTOMY     2008   BREAST LUMPECTOMY Left 08/31/2017   BREAST LUMPECTOMY WITH RADIOACTIVE SEED LOCALIZATION Left 08/10/2017   Procedure: BREAST LUMPECTOMY WITH RADIOACTIVE SEED LOCALIZATION;  Surgeon: Ebbie Cough, MD;  Location: [facility] OR;  Service: General;  Laterality: Left;   CESAREAN SECTION     1972   COLONOSCOPY     COLONOSCOPY WITH PROPOFOL  N/A 04/16/2016   Procedure: COLONOSCOPY WITH PROPOFOL ;  Surgeon: Belvie Just, MD;  Location: [facility] ENDOSCOPY;  Service: Endoscopy;  Laterality: N/A;   CYST REMOVAL NECK Left 04/13/2016   Procedure: EXCISION OF SEBACEOUS CYST LEFT POSTERIOR NECK;  Surgeon: Krystal Spinner, MD;  Location: [facility] OR;  Service: General;  Laterality: Left;   Excision of Pelvic Absess, Right Ovary     2008   RE-EXCISION OF BREAST CANCER,SUPERIOR MARGINS Left 08/31/2017   Procedure: RE-EXCISION OF LEFT  BREAST MARGINS ERAS PATHWAY;  Surgeon: Ebbie Cough, MD;  Location: [facility] OR;  Service: General;  Laterality:  Left;   TUBAL LIGATION  1980     Social History:   reports that she quit smoking about 12 years ago. Her smoking use included cigarettes. She started smoking about 62 years ago. She has a 50 pack-year smoking history. She has never used smokeless tobacco. She reports current alcohol use. She reports that she does not use drugs.   Family History:  Her family history includes Alzheimer's disease in her brother and father; Breast cancer in her paternal grandmother; Diabetes in her brother; Heart disease in her mother; Hyperlipidemia in her brother and brother; Hypertension in her brother, brother, and mother; Stroke (age of onset: 40) in her father.   Allergies Allergies  Allergen Reactions   Benicar  [Olmesartan ] Swelling    Swelling of face and arms    [name]  [Valsartan ] Swelling    Swelling of face and arms    Hydrocodone -Acetaminophen  Nausea And Vomiting    Severe vomiting   Lisinopril  Cough   Monosodium Glutamate Other (See Comments)    Facial swelling per pt  Codeine Other (See Comments)    jittery   Lead Acetate Rash   Nickel Rash    Severe rash to infection: pt is allergic to all metals other than sterling silver or gold jewelry.    Penicillins Other (See Comments)    Unknown reaction per family at the bedside     Home Medications  Prior to Admission medications   Medication Sig Start Date End Date Taking? Authorizing Provider  Accu-Chek Softclix Lancets lancets Use as instructed to check blood sugar 1x daily 03/30/23   Shamleffer, Ibtehal Jaralla, MD  acetaminophen  (TYLENOL ) 500 MG tablet Take 1,000 mg by mouth every 6 (six) hours as needed.    [provider]  albuterol  (PROVENTIL ) (2.5 MG/3ML) 0.083% nebulizer solution Take 3 mLs (2.5 mg total) by nebulization every 6 (six) hours as needed for wheezing or shortness of breath. 12/31/21   Parrett, Madelin RAMAN, NP  Albuterol  Sulfate (PROAIR  RESPICLICK) 108 (90 Base) MCG/ACT AEPB Inhale 1-2 puffs into the lungs every 6  (six) hours as needed (for wheezing/shortness of breath). 03/31/20   Geronimo Amel, MD  amLODipine  (NORVASC ) 10 MG tablet TAKE ONE TABLET BY MOUTH ONCE DAILY 11/24/22   Burnard Debby LABOR, MD  aspirin  EC 81 MG tablet Take 1 tablet (81 mg total) by mouth daily. 07/23/19   Kroeger, Krista M., PA-C  Blood Glucose Monitoring Suppl (ACCU-CHEK AVIVA PLUS) w/Device KIT Use to monitor glucose levels once per day; E11.9 06/10/23   Shamleffer, Donell Cardinal, MD  carvedilol  (COREG ) 6.25 MG tablet TAKE ONE TABLET BY MOUTH EVERY MORNING and TAKE ONE TABLET BY MOUTH EVERYDAY AT BEDTIME 11/24/22   Burnard Debby LABOR, MD  cetirizine  (ZYRTEC ) 10 MG tablet Take 10 mg by mouth every morning. 12/28/23   [provider]  Cholecalciferol (VITAMIN D ) 50 MCG (2000 UT) tablet Take 2,000 Units by mouth daily. 02/07/24   [provider]  Cholecalciferol (VITAMIN D3) 125 MCG (5000 UT) CAPS TAKE ONE CAPSULE BY MOUTH ONCE DAILY 12/29/22   Joshua Debby CROME, MD  dapagliflozin  propanediol (FARXIGA ) 10 MG TABS tablet Take 1 tablet (10 mg total) by mouth daily. 09/20/23   Shamleffer, Ibtehal Jaralla, MD  denosumab  (PROLIA ) 60 MG/ML SOSY injection Inject 60 mg into the skin every 6 (six) months. 06/16/22   Joshua Debby CROME, MD  FEROSUL 325 (65 Fe) MG tablet Take 325 mg by mouth 3 (three) times a week. 08/09/23   [provider]  Fluticasone -Umeclidin-Vilant (TRELEGY ELLIPTA ) 100-62.5-25 MCG/ACT AEPB INHALE ONE (1) PUFF BY MOUTH AND INTO THE LUNGS DAILY 12/12/23   Geronimo Amel, MD  glucose blood (ACCU-CHEK AVIVA PLUS) test strip USE TO Check blood glucose 1x DAILY 06/03/23   Shamleffer, Ibtehal Jaralla, MD  glucose blood (ACCU-CHEK GUIDE) test strip USE 1 STRIP ONCE DAILY 04/09/20   Kassie Mallick, MD  glucose blood test strip USE 1 STRIP ONCE DAILY 02/29/20   Kassie Mallick, MD  methocarbamol  (ROBAXIN ) 500 MG tablet Take 1 tablet (500 mg total) by mouth every 8 (eight) hours as needed for muscle spasms. 08/19/22   Elnor Lauraine BRAVO, NP  OXYGEN  Inhale 2-3 L into the lungs continuous. When exerting self    [provider]  Potassium Chloride  ER 20 MEQ TBCR Take 1 tablet by mouth every morning. 01/07/23   [provider]  roflumilast  (DALIRESP ) 500 MCG TABS tablet TAKE ONE TABLET BY MOUTH EVERY MORNING 11/25/22   Geronimo Amel, MD  rosuvastatin  (CRESTOR ) 40 MG tablet TAKE ONE TABLET BY MOUTH EVERYDAY  AT BEDTIME 11/24/22   Burnard Debby LABOR, MD  SitaGLIPtin -MetFORMIN  HCl (JANUMET  XR) 50-1000 MG TB24 Take 1 tablet by mouth daily before supper. 09/20/23   Shamleffer, Ibtehal Jaralla, MD  spironolactone  (ALDACTONE ) 25 MG tablet TAKE ONE TABLET BY MOUTH ONCE DAILY 11/24/22   Burnard Debby LABOR, MD  thiamine  (VITAMIN B1) 100 MG tablet TAKE 1 TABLET BY MOUTH EVERY OTHER DAY 01/20/23   Joshua Debby CROME, MD

## 2024-03-01 NOTE — TOC CAGE-AID Note (Signed)
 Transition of Care [facility]) - CAGE-AID Screening   Patient Details  Name: Glendon Fiser MRN: [id number] Date of Birth: 1946-05-03  [facility] CM/SW Contact:    Shriya Aker E Tony Friscia, LCSW Phone Number: 03/01/2024, 9:46 AM   Clinical Narrative: Currently intubated.   CAGE-AID Screening: Substance Abuse Screening unable to be completed due to: : Patient unable to participate

## 2024-03-01 NOTE — Progress Notes (Addendum)
 STROKE TEAM PROGRESS NOTE    SIGNIFICANT HOSPITAL EVENTS 10/14: Patient admitted with fever and tachycardia, intubated for airway protection 10/15: Patient found to have right MCA territory stroke and other small strokes, embolic appearing on MRI  INTERIM HISTORY/SUBJECTIVE  Patient has been hemodynamically stable off Levophed today.  She continues to be sedated with moderate dose Precedex but is unable to follow commands.  OBJECTIVE  CBC    Component Value Date/Time   WBC 12.3 (H) 03/01/2024 0444   RBC 3.77 (L) 03/01/2024 0444   HGB 10.5 (L) 03/01/2024 0444   HGB 11.9 (L) 11/05/2021 1123   HGB 13.1 03/13/2018 1539   HCT 33.8 (L) 03/01/2024 0444   HCT 38.7 03/13/2018 1539   PLT 132 (L) 03/01/2024 0444   PLT 237 11/05/2021 1123   PLT 271 03/13/2018 1539   MCV 89.7 03/01/2024 0444   MCV 85 03/13/2018 1539   MCH 27.9 03/01/2024 0444   MCHC 31.1 03/01/2024 0444   RDW 15.6 (H) 03/01/2024 0444   RDW 13.5 03/13/2018 1539   LYMPHSABS 1.0 02/27/2024 2130   MONOABS 1.3 (H) 02/27/2024 2130   EOSABS 0.0 02/27/2024 2130   BASOSABS 0.0 02/27/2024 2130    BMET    Component Value Date/Time   NA 135 03/01/2024 0444   NA 139 01/08/2019 1025   K 4.2 03/01/2024 0444   CL 102 03/01/2024 0444   CO2 24 03/01/2024 0444   GLUCOSE 121 (H) 03/01/2024 0444   BUN 30 (H) 03/01/2024 0444   BUN 15 01/08/2019 1025   CREATININE 1.10 (H) 03/01/2024 0444   CREATININE 0.84 11/05/2021 1123   CREATININE 0.74 12/24/2019 1420   CALCIUM  7.8 (L) 03/01/2024 0444   CALCIUM  9.8 11/15/2022 1404   EGFR 94.0 09/10/2023 1019   GFRNONAA 51 (L) 03/01/2024 0444   GFRNONAA >60 11/05/2021 1123   GFRNONAA 79 12/24/2019 1420    IMAGING past 24 hours CT ANGIO HEAD NECK W WO CM Result Date: 03/01/2024 EXAM: CTA HEAD AND NECK WITHOUT AND WITH 03/01/2024 11:02:16 AM TECHNIQUE: CTA of the head and neck was performed without and with the administration of 75 mL of intravenous contrast (iohexol  (OMNIPAQUE ) 350 MG/ML  injection 75 mL IOHEXOL  350 MG/ML SOLN). Multiplanar 2D and/or 3D reformatted images are provided for review. Automated exposure control, iterative reconstruction, and/or weight based adjustment of the mA/kV was utilized to reduce the radiation dose to as low as reasonably achievable. Stenosis of the internal carotid arteries measured using NASCET criteria. COMPARISON: MRI head 02/28/2024 and MRA head 02/29/2024 CLINICAL HISTORY: Stroke/TIA, determine embolic source. Chief complaints; Code Sepsis; CT ANGIO HEAD NECK W WO CM; Stroke/TIA, determine embolic source; 75ml [id number] FINDINGS: CTA NECK: AORTIC ARCH AND ARCH VESSELS: Mild atherosclerosis of the visualized aortic arch. No dissection or arterial injury. No significant stenosis of the brachiocephalic or subclavian arteries. CERVICAL CAROTID ARTERIES: The right carotid artery is patent from the origin to the skull base. Mild tortuosity of the proximal right common carotid artery. Atherosclerosis at the right carotid bifurcation without hemodynamically significant stenosis. The left carotid artery is patent from the origin to the skull base. Calcified atherosclerosis at the left carotid bifurcation without hemodynamically significant stenosis. No dissection or arterial injury. CERVICAL VERTEBRAL ARTERIES: The right vertebral artery is dominant. Atherosclerosis at the right vertebral artery origin results in mild stenosis. Atherosclerosis at the origin of the nondominant left vertebral artery resulting in moderate to severe stenosis. The vertebral arteries are patent to the vertebrobasilar confluence. No dissection or arterial injury.  LUNGS AND MEDIASTINUM: Partially visualized endotracheal tube and enteric tube. Heterogeneous appearance of the thyroid  with multiple nodules. The largest nodule in the left thyroid  lobe measures up to 2.7 cm. Follow up recommendations from thyroid  ultrasound in 2022. Emphysema. SOFT TISSUES: Fluid layering in the nasopharynx.  Scattered mucosal thickening throughout the paranasal sinuses particularly in the ethmoid and sphenoid sinuses. BONES: Degenerative changes in the visualized spine. No acute abnormality. CTA HEAD: ANTERIOR CIRCULATION: The intracranial internal carotid arteries are patent bilaterally. Atherosclerosis of the carotid siphons with mild stenosis of the bilateral cavernous ICAs. The middle cerebral arteries are patent bilaterally. The anterior cerebral arteries are patent bilaterally. No aneurysm. POSTERIOR CIRCULATION: No significant stenosis of the posterior cerebral arteries. No significant stenosis of the basilar artery. No aneurysm. OTHER: Hypoattenuation in the posterior and lateral aspects of the right temporal lobe corresponding to known acute infarct seen on recent MRI. Nonspecific hypoattenuation in the periventricular and subcortical white matter, most likely representing chronic microvascular ischemic changes. Mild parenchymal volume loss. No dural venous sinus thrombosis on this non-dedicated study. IMPRESSION: 1. 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 2. No large vessel occlusion or hemodynamically significant stenosis in the head or neck. 3. Atherosclerosis at the origin of the nondominant left vertebral artery resulting in moderate to severe stenosis. 4. Mild atherosclerosis of the aortic arch and carotid arteries without hemodynamically significant stenosis. 5. Mild stenosis of the right vertebral artery origin. 6. Mild stenosis of the bilateral cavernous internal carotid arteries. 7. Emphysema. Electronically signed by: Donnice Mania MD 03/01/2024 11:33 AM EDT RP Workstation: [id number]    Vitals:   03/01/24 1130 03/01/24 1200 03/01/24 1230 03/01/24 1300  BP: (!) 102/42 (!) 121/54 139/79 (!) 114/41  Pulse: 64 63 81 60  Resp: 13 13 18 15   Temp: 99.1 F (37.3 C) 98.8 F (37.1 C) 99 F (37.2 C) 99 F (37.2 C)  TempSrc:      SpO2:  95% 96% 94% 95%  Weight:      Height:         PHYSICAL EXAM General: Ill-appearing, elderly patient in no acute distress CV: Regular rate and rhythm on monitor Respiratory: Respirations synchronous with ventilator   NEURO (on sedation with moderate dose Precedex) Patient opened eyes to voice and touch but does not track examiner.  Pupils equal round and reactive, oculocephalic reflex present, patient will grimace to noxious stimuli but does not localize or follow commands she will move right upper extremity with antigravity strength, flickers left upper extremity and bilateral lower extremities to noxious  Most Recent NIH  1a Level of Conscious.: 2 1b LOC Questions: 2 1c LOC Commands: 2 2 Best Gaze: 0 3 Visual: 0 4 Facial Palsy: 0 5a Motor Arm - left: 3 5b Motor Arm - Right: 2 6a Motor Leg - Left: 3 6b Motor Leg - Right: 3 7 Limb Ataxia: 0 8 Sensory: 0 9 Best Language: 3 10 Dysarthria: 2 11 Extinct. and Inatten.: 0 TOTAL: 22   ASSESSMENT/PLAN  Ms. Deshana Rosalina Dingwall is a 78 y.o. female with history of breast cancer, COPD, sleep apnea, CKD, hypertension, hyperlipidemia and diabetes after being found down with tachycardia fever and encephalopathy.  She was intubated for airway protection.  Lumbar puncture was performed to rule out infectious source, and meningitis is no longer suspected.  MRI reveals multiple acute ischemic infarcts, largest in the right MCA territory.  Stroke: Right MCA territory infarct as  well as multiple bilateral small embolic appearing infarcts, etiology: concerning for endocarditis CT head No acute abnormality.  CTA head & neck no LVO or hemodynamically significant stenosis, moderate to severe stenosis at origin of nondominant left VA, mild stenosis of right VA origin and bilateral cavernous ICAs MRI restricted diffusion laterally within right mid and posterior temporal lobe and numerous foci of restricted diffusion involving cortex and subcortical  white matter disease bilaterally, infarcts appear embolic MRA neg 2D Echo EF 60 to 65%  TEE pending EEG with continuous slowing with no seizures or epileptiform discharges LDL 78 HgbA1c 7.0 VTE prophylaxis -Lovenox  aspirin  81 mg daily prior to admission, now on aspirin  81 mg daily  Therapy recommendations:  Pending Disposition: Pending  Strep bacteremia Blood cultures positive with strep 10/13 Repeat blood cultures negative so far CT C/A/P neg TEE pending to rule out endocarditis Continue ceftriaxone 2 mg every 12 hours  Encephalopathy Patient was found with altered mental status on admission Strokes may be partially contributing to encephalopathy Toxic metabolic encephalopathy in the setting of multiple metabolic derangements also likely EEG reveals no seizures or epileptiform activity but was performed when patient was on propofol , so could consider repeating if mental status does not improve  Hypertension Home meds: Spironolactone  25 mg daily, carvedilol  6.25 mg twice daily, amlodipine  10 mg daily Stable, was requiring Levophed for hypotension yesterday but now stable Long term BP goal normmotensive  Hyperlipidemia Home meds: None LDL 78, goal < 70 Hold off statin due to elevated CK and AST May Initiate low dose statin at discharge  Diabetes type II Controlled Home meds:  Farxiga  10 mg daily, Janumet  XR 50-1000 daily HgbA1c 7.0, goal < 7.0 CBGs SSI Recommend close follow-up with PCP for better DM control  Respiratory failure Patient intubated for airway protection Ventilator management per CCM Extubate when able On precedex  AKI and rhabdomyolysis CK peaked at 2418, now 1638 Creatinine 1.37->1.1 today, downtrending Renally dose medications as appropriate IV fluids per primary team  Dysphagia Patient has post-stroke dysphagia, SLP consulted NPO now On TF  Other Stroke Risk Factors Obesity, Body mass index is 36.55 kg/m., BMI >/= 30 associated with  increased stroke risk, recommend weight loss, diet and exercise as appropriate  Obstructive sleep apnea  Other Active Problems Breast cancer s/p left lumpectomy  in 2019  Hospital day # 2  Patient seen by NP and then by MD, MD to edit note as needed. Cortney E Everitt Clint Kill , MSN, AGACNP-BC [facility] See Amion for schedule and pager information 03/01/2024 2:36 PM   ATTENDING NOTE: I reviewed above note and agree with the assessment and plan. Pt was seen and examined.   No family at the bedside. Pt is intubated on precedex now, eyes halfway open on voice, not following commands. With forced eye opening, eyes in mid and upward position, not blinking to visual threat, not tracking, pupils equal 2.53mm but sluggish. Corneal reflex present bilaterally, gag and cough present. Breathing over the vent.  Facial symmetry not able to test due to ET tube.  Tongue protrusion not cooperative. Some spontaneous movement of RUE and R foot but no significant movement of LUE and LLE on pain stimulation. Bilateral babinski. Sensation, coordination and gait not tested.   For detailed assessment and plan, please refer to above as I have made changes wherever appropriate.   Ary Cummins, MD PhD Stroke Neurology 03/01/2024 5:59 PM  This patient is critically ill due to stroke, bacteremia, possible endocarditis, encephalopathy, respiratory  failure and at significant risk of neurological worsening, death form recurrent stroke, hemorrhagic conversion, seizure, heart failure. This patient's care requires constant monitoring of vital signs, hemodynamics, respiratory and cardiac monitoring, review of multiple databases, neurological assessment, discussion with family, other specialists and medical decision making of high complexity. I spent 45 minutes of neurocritical care time in the care of this patient.    To contact Stroke Continuity provider, please refer to [email or web address]. After hours, contact General  Neurology

## 2024-03-01 NOTE — Progress Notes (Addendum)
  [facility] has been requested to perform a transesophageal echocardiogram on Victoria [name] for bacteremia.    The patient does NOT have any absolute or relative contraindications to a Transesophageal Echocardiogram (TEE).  The patient has: Current Oxygen  Requirement, currently intubated   Procedure is going to be completed bedside in ICU around 8:30 am by Dr. Zenaida.  After careful review of history and examination, the risks and benefits of transesophageal echocardiogram have been explained including risks of esophageal damage, perforation (1:10,000 risk), bleeding, pharyngeal hematoma as well as other potential complications associated with conscious sedation including aspiration, arrhythmia, respiratory failure and death. Alternatives to treatment were discussed, questions were answered. The family is currently intubated and sedated, therefore the procedure was discussed with [name] via phone. [name] family is willing to proceed. Her nurse Violeta Bonier, RN was present with me, verified consent with son on the phone and all forms were filled out.  Signed, Waddell DELENA Donath, PA-C  03/01/2024 2:21 PM

## 2024-03-01 NOTE — Progress Notes (Signed)
 RT transported pt on ventilator from 3M10 to CT and back to 3M10 without any complications. RN at bedside.

## 2024-03-02 ENCOUNTER — Inpatient Hospital Stay (HOSPITAL_COMMUNITY)

## 2024-03-02 ENCOUNTER — Other Ambulatory Visit: Payer: Self-pay

## 2024-03-02 ENCOUNTER — Inpatient Hospital Stay: Admission: RE | Admit: 2024-03-02 | Source: Ambulatory Visit

## 2024-03-02 DIAGNOSIS — N179 Acute kidney failure, unspecified: Secondary | ICD-10-CM | POA: Diagnosis not present

## 2024-03-02 DIAGNOSIS — G9341 Metabolic encephalopathy: Secondary | ICD-10-CM | POA: Diagnosis not present

## 2024-03-02 DIAGNOSIS — I76 Septic arterial embolism: Secondary | ICD-10-CM

## 2024-03-02 DIAGNOSIS — I330 Acute and subacute infective endocarditis: Secondary | ICD-10-CM | POA: Diagnosis not present

## 2024-03-02 DIAGNOSIS — A409 Streptococcal sepsis, unspecified: Secondary | ICD-10-CM

## 2024-03-02 DIAGNOSIS — M6282 Rhabdomyolysis: Secondary | ICD-10-CM | POA: Diagnosis not present

## 2024-03-02 DIAGNOSIS — R7881 Bacteremia: Secondary | ICD-10-CM | POA: Diagnosis not present

## 2024-03-02 DIAGNOSIS — I38 Endocarditis, valve unspecified: Secondary | ICD-10-CM | POA: Diagnosis not present

## 2024-03-02 DIAGNOSIS — R29722 NIHSS score 22: Secondary | ICD-10-CM | POA: Diagnosis not present

## 2024-03-02 DIAGNOSIS — R6520 Severe sepsis without septic shock: Secondary | ICD-10-CM

## 2024-03-02 DIAGNOSIS — G9340 Encephalopathy, unspecified: Secondary | ICD-10-CM | POA: Diagnosis not present

## 2024-03-02 DIAGNOSIS — I6340 Cerebral infarction due to embolism of unspecified cerebral artery: Secondary | ICD-10-CM

## 2024-03-02 DIAGNOSIS — D6869 Other thrombophilia: Secondary | ICD-10-CM

## 2024-03-02 DIAGNOSIS — B955 Unspecified streptococcus as the cause of diseases classified elsewhere: Secondary | ICD-10-CM | POA: Diagnosis not present

## 2024-03-02 DIAGNOSIS — E119 Type 2 diabetes mellitus without complications: Secondary | ICD-10-CM | POA: Diagnosis not present

## 2024-03-02 LAB — COMPREHENSIVE METABOLIC PANEL WITH GFR
ALT: 16 U/L (ref 0–44)
AST: 23 U/L (ref 15–41)
Albumin: 2.1 g/dL — ABNORMAL LOW (ref 3.5–5.0)
Alkaline Phosphatase: 93 U/L (ref 38–126)
Anion gap: 11 (ref 5–15)
BUN: 27 mg/dL — ABNORMAL HIGH (ref 8–23)
CO2: 25 mmol/L (ref 22–32)
Calcium: 8.3 mg/dL — ABNORMAL LOW (ref 8.9–10.3)
Chloride: 102 mmol/L (ref 98–111)
Creatinine, Ser: 0.91 mg/dL (ref 0.44–1.00)
GFR, Estimated: >60 mL/min (ref >60)
Glucose, Bld: 178 mg/dL — ABNORMAL HIGH (ref 70–99)
Potassium: 4.4 mmol/L (ref 3.5–5.1)
Sodium: 138 mmol/L (ref 135–145)
Total Bilirubin: 0.3 mg/dL (ref 0.0–1.2)
Total Protein: 5.8 g/dL — ABNORMAL LOW (ref 6.5–8.1)

## 2024-03-02 LAB — MAGNESIUM: Magnesium: 2.6 mg/dL — ABNORMAL HIGH (ref 1.7–2.4)

## 2024-03-02 LAB — CBC WITH DIFFERENTIAL/PLATELET
Abs Immature Granulocytes: 0.05 K/uL (ref 0.00–0.07)
Basophils Absolute: 0 K/uL (ref 0.0–0.1)
Basophils Relative: 0 %
Eosinophils Absolute: 0 K/uL (ref 0.0–0.5)
Eosinophils Relative: 0 %
HCT: 32.2 % — ABNORMAL LOW (ref 36.0–46.0)
Hemoglobin: 9.9 g/dL — ABNORMAL LOW (ref 12.0–15.0)
Immature Granulocytes: 1 %
Lymphocytes Relative: 6 %
Lymphs Abs: 0.6 K/uL — ABNORMAL LOW (ref 0.7–4.0)
MCH: 27.5 pg (ref 26.0–34.0)
MCHC: 30.7 g/dL (ref 30.0–36.0)
MCV: 89.4 fL (ref 80.0–100.0)
Monocytes Absolute: 1 K/uL (ref 0.1–1.0)
Monocytes Relative: 9 %
Neutro Abs: 9 K/uL — ABNORMAL HIGH (ref 1.7–7.7)
Neutrophils Relative %: 84 %
Platelets: 165 K/uL (ref 150–400)
RBC: 3.6 MIL/uL — ABNORMAL LOW (ref 3.87–5.11)
RDW: 15.5 % (ref 11.5–15.5)
WBC: 10.6 K/uL — ABNORMAL HIGH (ref 4.0–10.5)
nRBC: 0 % (ref 0.0–0.2)

## 2024-03-02 LAB — GLUCOSE, CAPILLARY
Glucose-Capillary: 180 mg/dL — ABNORMAL HIGH (ref 70–99)
Glucose-Capillary: 133 mg/dL — ABNORMAL HIGH (ref 70–99)
Glucose-Capillary: 138 mg/dL — ABNORMAL HIGH (ref 70–99)
Glucose-Capillary: 157 mg/dL — ABNORMAL HIGH (ref 70–99)
Glucose-Capillary: 134 mg/dL — ABNORMAL HIGH (ref 70–99)
Glucose-Capillary: 141 mg/dL — ABNORMAL HIGH (ref 70–99)

## 2024-03-02 LAB — BASIC METABOLIC PANEL WITH GFR
Anion gap: 9 (ref 5–15)
Anion gap: 9 (ref 5–15)
BUN: 27 mg/dL — ABNORMAL HIGH (ref 8–23)
BUN: 27 mg/dL — ABNORMAL HIGH (ref 8–23)
CO2: 27 mmol/L (ref 22–32)
CO2: 25 mmol/L (ref 22–32)
Calcium: 8.2 mg/dL — ABNORMAL LOW (ref 8.9–10.3)
Calcium: 8.4 mg/dL — ABNORMAL LOW (ref 8.9–10.3)
Chloride: 101 mmol/L (ref 98–111)
Chloride: 103 mmol/L (ref 98–111)
Creatinine, Ser: 0.99 mg/dL (ref 0.44–1.00)
Creatinine, Ser: 0.89 mg/dL (ref 0.44–1.00)
GFR, Estimated: 58 mL/min — ABNORMAL LOW (ref >60)
GFR, Estimated: >60 mL/min (ref >60)
Glucose, Bld: 178 mg/dL — ABNORMAL HIGH (ref 70–99)
Glucose, Bld: 189 mg/dL — ABNORMAL HIGH (ref 70–99)
Potassium: 4.2 mmol/L (ref 3.5–5.1)
Potassium: 4.3 mmol/L (ref 3.5–5.1)
Sodium: 137 mmol/L (ref 135–145)
Sodium: 137 mmol/L (ref 135–145)

## 2024-03-02 LAB — CK
Total CK: 380 U/L — ABNORMAL HIGH (ref 38–234)
Total CK: 389 U/L — ABNORMAL HIGH (ref 38–234)

## 2024-03-02 LAB — ECHO TEE

## 2024-03-02 LAB — PHOSPHORUS: Phosphorus: 2.9 mg/dL (ref 2.5–4.6)

## 2024-03-02 MED ORDER — ATROPINE SULFATE 1 MG/10ML IJ SOSY
PREFILLED_SYRINGE | INTRAMUSCULAR | Status: AC
  Filled 2024-03-02: qty 10

## 2024-03-02 MED ORDER — FUROSEMIDE 10 MG/ML IJ SOLN
40.0000 mg | Freq: Once | INTRAMUSCULAR | Status: AC
  Administered 2024-03-02: 40 mg via INTRAVENOUS
  Filled 2024-03-02: qty 4

## 2024-03-02 MED ORDER — FENTANYL BOLUS VIA INFUSION
25.0000 ug | INTRAVENOUS | Status: DC | PRN
  Administered 2024-03-03: 100 ug via INTRAVENOUS
  Administered 2024-03-03: 50 ug via INTRAVENOUS
  Administered 2024-03-03: 100 ug via INTRAVENOUS
  Administered 2024-03-03: 50 ug via INTRAVENOUS
  Administered 2024-03-03 (×2): 100 ug via INTRAVENOUS
  Administered 2024-03-03: 50 ug via INTRAVENOUS
  Administered 2024-03-03 – 2024-03-06 (×15): 100 ug via INTRAVENOUS
  Administered 2024-03-06 (×3): 50 ug via INTRAVENOUS
  Administered 2024-03-07 – 2024-03-08 (×6): 100 ug via INTRAVENOUS
  Administered 2024-03-09: 75 ug via INTRAVENOUS
  Administered 2024-03-09: 100 ug via INTRAVENOUS
  Administered 2024-03-09: 50 ug via INTRAVENOUS
  Administered 2024-03-10: 100 ug via INTRAVENOUS

## 2024-03-02 MED ORDER — STROKE: EARLY STAGES OF RECOVERY BOOK
Freq: Once | Status: AC
  Filled 2024-03-02: qty 1

## 2024-03-02 MED ORDER — MIDAZOLAM HCL (PF) 2 MG/2ML IJ SOLN
2.0000 mg | INTRAMUSCULAR | Status: DC | PRN
  Administered 2024-03-02 (×3): 2 mg via INTRAVENOUS
  Filled 2024-03-02 (×3): qty 2

## 2024-03-02 MED ORDER — LABETALOL HCL 5 MG/ML IV SOLN
10.0000 mg | INTRAVENOUS | Status: DC | PRN
  Administered 2024-03-02: 10 mg via INTRAVENOUS
  Filled 2024-03-02: qty 4

## 2024-03-02 MED ORDER — LACTATED RINGERS IV SOLN: INTRAVENOUS | Status: DC

## 2024-03-02 MED ORDER — FENTANYL 2500MCG IN NS 250ML (10MCG/ML) PREMIX INFUSION
0.0000 ug/h | INTRAVENOUS | Status: AC
  Administered 2024-03-02: 25 ug/h via INTRAVENOUS
  Administered 2024-03-03: 200 ug/h via INTRAVENOUS
  Administered 2024-03-03: 100 ug/h via INTRAVENOUS
  Administered 2024-03-04: 200 ug/h via INTRAVENOUS
  Filled 2024-03-02 (×4): qty 250

## 2024-03-02 MED ORDER — PROPOFOL 1000 MG/100ML IV EMUL
0.0000 ug/kg/min | INTRAVENOUS | Status: AC
  Administered 2024-03-02: 10 ug/kg/min via INTRAVENOUS
  Administered 2024-03-03: 30 ug/kg/min via INTRAVENOUS
  Administered 2024-03-03 (×2): 40 ug/kg/min via INTRAVENOUS
  Administered 2024-03-03: 25 ug/kg/min via INTRAVENOUS
  Administered 2024-03-03: 35 ug/kg/min via INTRAVENOUS
  Administered 2024-03-03 (×2): 40 ug/kg/min via INTRAVENOUS
  Administered 2024-03-04: 30 ug/kg/min via INTRAVENOUS
  Administered 2024-03-04: 40 ug/kg/min via INTRAVENOUS
  Administered 2024-03-04: 25 ug/kg/min via INTRAVENOUS
  Administered 2024-03-04: 30 ug/kg/min via INTRAVENOUS
  Administered 2024-03-04: 40 ug/kg/min via INTRAVENOUS
  Administered 2024-03-05: 25 ug/kg/min via INTRAVENOUS
  Administered 2024-03-05: 35 ug/kg/min via INTRAVENOUS
  Administered 2024-03-05: 25 ug/kg/min via INTRAVENOUS
  Administered 2024-03-05: 30 ug/kg/min via INTRAVENOUS
  Administered 2024-03-06 (×2): 25 ug/kg/min via INTRAVENOUS
  Filled 2024-03-02 (×6): qty 100
  Filled 2024-03-02: qty 200
  Filled 2024-03-02 (×11): qty 100

## 2024-03-02 NOTE — Progress Notes (Addendum)
 0345 - Called to bedside for pt desaturation. Upon arrival RN at bedside. Pt SPO2 at 86% on FIO2 of 40% with increased WOB and accessory muscle use. Diminished breath sounds and wheezing present. Increased FIO2 to 70%. Suctioned pt got minimal secretions with no improvement. Instilled saline and repeated suction obtained moderate amount of thick white secretions with minimal improvement. RN contacted MD. Administered Duoneb slight improvement in WOB after treatment was given. SPO2 increased to 97% able to wean pt down to 50% gradually. Pt dyssynchronous with vent sedation ordered.  Noticeable improvement after sedation was given. CXR ordered awaiting results. Will continue to monitor.

## 2024-03-02 NOTE — Procedures (Signed)
 Cortrak  Tube Type:  Cortrak - 43 inches Tube Location:  Left nare Secured by: Bridle Initial Placement:  Gastric Technique Used to Measure Tube Placement:  Marking at nare/corner of mouth Cortrak Secured At:  70 cm   Cortrak Tube Team Note:  Consult received to place a Cortrak feeding tube.   No x-ray is required. RN may begin using tube.   If the tube becomes dislodged please keep the tube and contact the Cortrak team at [email or web address] for replacement.  If after hours and replacement cannot be delayed, place a NG tube and confirm placement with an abdominal x-ray.    Augustin Shams MS, RD, LDN If unable to be reached, please send secure chat to RD inpatient available from 8:00a-4:00p daily

## 2024-03-02 NOTE — Progress Notes (Signed)
 Nutrition Brief Note   Patient currently remains on the vent and underwent TEE this morning. Cortrak placed this morning. Discussed with resident and CCM MD, ok to restart feeds. RN notified.   INTERVENTION:  Tube feeds via Cortrak:  Osmolite 1.5 at 30 ml/hr, advance to goal of 40 mL/hr in 8 hours (960 ml per day) Provides 1440 kcal, 60 gm protein, 731 ml free water daily   Continue thiamine  100 mg supplementation daily   RD team will continue to follow during admission.    Nestora Glatter RD, LDN Clinical Dietitian

## 2024-03-02 NOTE — Progress Notes (Addendum)
 STROKE TEAM PROGRESS NOTE    SIGNIFICANT HOSPITAL EVENTS 10/14: Patient admitted with fever and tachycardia, intubated for airway protection 10/15: Patient found to have right MCA territory stroke and other small strokes, embolic appearing on MRI  INTERIM HISTORY/SUBJECTIVE Sedated for TEE. ID following. TEE negative for endocarditis  Per RN she did follow commands prior to receiving for sedation Needed increased sedation overnight due to dyssynchrony and increased WOB   OBJECTIVE  CBC    Component Value Date/Time   WBC 12.3 (H) 03/01/2024 0444   RBC 3.77 (L) 03/01/2024 0444   HGB 10.5 (L) 03/01/2024 0444   HGB 11.9 (L) 11/05/2021 1123   HGB 13.1 03/13/2018 1539   HCT 33.8 (L) 03/01/2024 0444   HCT 38.7 03/13/2018 1539   PLT 132 (L) 03/01/2024 0444   PLT 237 11/05/2021 1123   PLT 271 03/13/2018 1539   MCV 89.7 03/01/2024 0444   MCV 85 03/13/2018 1539   MCH 27.9 03/01/2024 0444   MCHC 31.1 03/01/2024 0444   RDW 15.6 (H) 03/01/2024 0444   RDW 13.5 03/13/2018 1539   LYMPHSABS 1.0 02/27/2024 2130   MONOABS 1.3 (H) 02/27/2024 2130   EOSABS 0.0 02/27/2024 2130   BASOSABS 0.0 02/27/2024 2130    BMET    Component Value Date/Time   NA 135 03/01/2024 0444   NA 139 01/08/2019 1025   K 4.2 03/01/2024 0444   CL 102 03/01/2024 0444   CO2 24 03/01/2024 0444   GLUCOSE 121 (H) 03/01/2024 0444   BUN 30 (H) 03/01/2024 0444   BUN 15 01/08/2019 1025   CREATININE 1.10 (H) 03/01/2024 0444   CREATININE 0.84 11/05/2021 1123   CREATININE 0.74 12/24/2019 1420   CALCIUM  7.8 (L) 03/01/2024 0444   CALCIUM  9.8 11/15/2022 1404   EGFR 94.0 09/10/2023 1019   GFRNONAA 51 (L) 03/01/2024 0444   GFRNONAA >60 11/05/2021 1123   GFRNONAA 79 12/24/2019 1420    IMAGING past 24 hours DG CHEST PORT 1 VIEW Result Date: 03/02/2024 EXAM: 1 AP VIEW XRAY OF THE CHEST 03/02/2024 07:56:00 AM COMPARISON: AP radiograph chest dated 03/02/2024. CLINICAL HISTORY: Endotracheally intubated. FINDINGS: LINES,  TUBES AND DEVICES: An endotracheal tube and gastric tube remain in satisfactory position. LUNGS AND PLEURA: There is mild hazy opacification of the lower lung zones. No pulmonary edema. No pleural effusion. No pneumothorax. HEART AND MEDIASTINUM: No acute abnormality of the cardiac and mediastinal silhouettes. BONES AND SOFT TISSUES: No acute osseous abnormality. IMPRESSION: 1. Mild hazy opacification of the lower lung zones. 2. Endotracheal tube and gastric tube in satisfactory position. Electronically signed by: Evalene Coho MD 03/02/2024 07:59 AM EDT RP Workstation: [id number]   US  EKG SITE RITE Result Date: 03/02/2024 If Site Rite image not attached, placement could not be confirmed due to current cardiac rhythm.  DG Chest Port 1 View Result Date: 03/02/2024 EXAM: 1 VIEW XRAY OF THE CHEST 03/02/2024 04:15:00 AM COMPARISON: Portal chest 02/28/2024. CLINICAL HISTORY: Oxygen  desaturation. FINDINGS: LINES, TUBES AND DEVICES: ETT has been pulled back to 5.6 cm from the carina. NGT is within the stomach but the side hole is just below the hiatus and could be advanced further in 5 to 8 cm for better insertion. LUNGS AND PLEURA: Atelectatic bands are again present in both bases. No focal pneumonia. The lungs are emphysematous and otherwise clear. No pulmonary edema. No pleural effusion. No pneumothorax. HEART AND MEDIASTINUM: Stable cardiomegaly. Central vascular prominence is similar, but with no overt edema. Stable mediastinum. There is aortic atherosclerosis. BONES AND  SOFT TISSUES: No acute osseous abnormality. IMPRESSION: 1. Endotracheal tube tip 5.6 cm above the carina; nasogastric tube in the stomach with side port just below the hiatus and may be advanced 58 cm for optimal positioning. 2. No acute chest process. 3. Stable cardiomegaly without overt pulmonary edema. 4. Bibasilar atelectasis without focal pneumonia. 5. Emphysema. 6. Aortic atherosclerosis. Electronically signed by: Francis Quam MD  03/02/2024 05:28 AM EDT RP Workstation: [id number]   US  RENAL Result Date: 03/01/2024 CLINICAL DATA:  Acute renal insufficiency. EXAM: RENAL / URINARY TRACT ULTRASOUND COMPLETE COMPARISON:  CT dated 02/28/2024. FINDINGS: Evaluation is limited due to body habitus. Right Kidney: Renal measurements: 11.5 x 5.1 x 4.9 cm = volume: 152 mL. A 1 cm interpolar cyst. No hydronephrosis or shadowing stone. Left Kidney: Renal measurements: 12.1 x 6.4 x 4.4 cm = volume: 178 mL. Normal echogenicity. No hydronephrosis or shadowing stone. Bladder: Decompressed by Foley catheter and not visualized. Other: None. IMPRESSION: No hydronephrosis or shadowing stone. Electronically Signed   By: Vanetta Chou M.D.   On: 03/01/2024 21:07   CT ANGIO HEAD NECK W WO CM Result Date: 03/01/2024 EXAM: CTA HEAD AND NECK WITHOUT AND WITH 03/01/2024 11:02:16 AM TECHNIQUE: CTA of the head and neck was performed without and with the administration of 75 mL of intravenous contrast (iohexol  (OMNIPAQUE ) 350 MG/ML injection 75 mL IOHEXOL  350 MG/ML SOLN). Multiplanar 2D and/or 3D reformatted images are provided for review. Automated exposure control, iterative reconstruction, and/or weight based adjustment of the mA/kV was utilized to reduce the radiation dose to as low as reasonably achievable. Stenosis of the internal carotid arteries measured using NASCET criteria. COMPARISON: MRI head 02/28/2024 and MRA head 02/29/2024 CLINICAL HISTORY: Stroke/TIA, determine embolic source. Chief complaints; Code Sepsis; CT ANGIO HEAD NECK W WO CM; Stroke/TIA, determine embolic source; 75ml Omni350 FINDINGS: CTA NECK: AORTIC ARCH AND ARCH VESSELS: Mild atherosclerosis of the visualized aortic arch. No dissection or arterial injury. No significant stenosis of the brachiocephalic or subclavian arteries. CERVICAL CAROTID ARTERIES: The right carotid artery is patent from the origin to the skull base. Mild tortuosity of the proximal right common carotid artery.  Atherosclerosis at the right carotid bifurcation without hemodynamically significant stenosis. The left carotid artery is patent from the origin to the skull base. Calcified atherosclerosis at the left carotid bifurcation without hemodynamically significant stenosis. No dissection or arterial injury. CERVICAL VERTEBRAL ARTERIES: The right vertebral artery is dominant. Atherosclerosis at the right vertebral artery origin results in mild stenosis. Atherosclerosis at the origin of the nondominant left vertebral artery resulting in moderate to severe stenosis. The vertebral arteries are patent to the vertebrobasilar confluence. No dissection or arterial injury. LUNGS AND MEDIASTINUM: Partially visualized endotracheal tube and enteric tube. Heterogeneous appearance of the thyroid  with multiple nodules. The largest nodule in the left thyroid  lobe measures up to 2.7 cm. Follow up recommendations from thyroid  ultrasound in 2022. Emphysema. SOFT TISSUES: Fluid layering in the nasopharynx. Scattered mucosal thickening throughout the paranasal sinuses particularly in the ethmoid and sphenoid sinuses. BONES: Degenerative changes in the visualized spine. No acute abnormality. CTA HEAD: ANTERIOR CIRCULATION: The intracranial internal carotid arteries are patent bilaterally. Atherosclerosis of the carotid siphons with mild stenosis of the bilateral cavernous ICAs. The middle cerebral arteries are patent bilaterally. The anterior cerebral arteries are patent bilaterally. No aneurysm. POSTERIOR CIRCULATION: No significant stenosis of the posterior cerebral arteries. No significant stenosis of the basilar artery. No aneurysm. OTHER: Hypoattenuation in the posterior and lateral aspects of the right temporal lobe  corresponding to known acute infarct seen on recent MRI. Nonspecific hypoattenuation in the periventricular and subcortical white matter, most likely representing chronic microvascular ischemic changes. Mild parenchymal volume  loss. No dural venous sinus thrombosis on this non-dedicated study. IMPRESSION: 1. 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 2. No large vessel occlusion or hemodynamically significant stenosis in the head or neck. 3. Atherosclerosis at the origin of the nondominant left vertebral artery resulting in moderate to severe stenosis. 4. Mild atherosclerosis of the aortic arch and carotid arteries without hemodynamically significant stenosis. 5. Mild stenosis of the right vertebral artery origin. 6. Mild stenosis of the bilateral cavernous internal carotid arteries. 7. Emphysema. Electronically signed by: Donnice Mania MD 03/01/2024 11:33 AM EDT RP Workstation: [id number]    Vitals:   03/02/24 0504 03/02/24 0600 03/02/24 0700 03/02/24 0710  BP:  (!) 139/51 (!) 146/57   Pulse:  65 61   Resp:  16 16   Temp:  97.7 F (36.5 C) 98.8 F (37.1 C)   TempSrc:    Bladder  SpO2: 95% 97% 98%   Weight:      Height:         PHYSICAL EXAM General: Ill-appearing, elderly patient in no acute distress CV: Regular rate and rhythm on monitor Respiratory: Respirations synchronous with ventilator   NEURO (sedated for TEE) Patient does not open eyes or track examiner.  Pupils equal round and reactive, oculocephalic reflex present, patient will grimace to noxious stimuli but does not localize or follow commands she will move right upper extremity with antigravity strength, flickers left upper extremity and bilateral lower extremities to noxious  Reported that she did follow commands for nurse prior to TEE  Most Recent NIH  1a Level of Conscious.: 2 1b LOC Questions: 2 1c LOC Commands: 2 2 Best Gaze: 0 3 Visual: 0 4 Facial Palsy: 0 5a Motor Arm - left: 3 5b Motor Arm - Right: 2 6a Motor Leg - Left: 3 6b Motor Leg - Right: 3 7 Limb Ataxia: 0 8 Sensory: 0 9 Best Language: 3 10 Dysarthria: 2 11 Extinct. and Inatten.: 0 TOTAL:  22   ASSESSMENT/PLAN  [name] is a 78 y.o. female with history of breast cancer, COPD, sleep apnea, CKD, hypertension, hyperlipidemia and diabetes after being found down with tachycardia fever and encephalopathy.  She was intubated for airway protection.  Lumbar puncture was performed to rule out infectious source, and meningitis is no longer suspected.  MRI reveals multiple acute ischemic infarcts, largest in the right MCA territory.  Stroke: Right MCA territory infarct as well as multiple bilateral small embolic appearing infarcts, etiology: still concerning for endocarditis, can not rule out hypercoagulable state from severe sepsis CT head No acute abnormality.  CTA head & neck no LVO or hemodynamically significant stenosis, moderate to severe stenosis at origin of nondominant left VA, mild stenosis of right VA origin and bilateral cavernous ICAs MRI restricted diffusion laterally within right mid and posterior temporal lobe and numerous foci of restricted diffusion involving cortex and subcortical white matter disease bilaterally, infarcts appear embolic MRA neg 2D Echo EF 60 to 65%  TEE - Normal LV and RV function No evidence of endocarditis  EEG with continuous slowing with no seizures or epileptiform discharges LDL 78 HgbA1c 7.0 VTE prophylaxis -Lovenox  aspirin  81 mg daily prior to admission, now on home aspirin  81 mg daily.  Therapy recommendations:  Pending Disposition: Pending  Strep bacteremia Leukocytosis WBC 12.9-12.3-10.6 Blood cultures positive with strep 10/13 Repeat blood cultures negative so far CT C/A/P neg TEE 10/17 negative for endocarditis  Continue ceftriaxone 2 mg every 12 hours ID on board  Encephalopathy Patient was found with altered mental status on admission Strokes may be partially contributing to encephalopathy Toxic metabolic encephalopathy in the setting of multiple metabolic derangements also likely EEG reveals no seizures or  epileptiform activity but was performed when patient was on propofol , so could consider repeating if mental status does not improve  Hypertension Home meds: Spironolactone  25 mg daily, carvedilol  6.25 mg twice daily, amlodipine  10 mg daily Stable, was requiring Levophed for hypotension yesterday but now stable Long term BP goal normmotensive  Hyperlipidemia Home meds: None LDL 78, goal < 70 Hold off statin due to elevated CK and AST May Initiate low dose statin at discharge  Diabetes type II Controlled Home meds:  Farxiga  10 mg daily, Janumet  XR 50-1000 daily HgbA1c 7.0, goal < 7.0 CBGs SSI Recommend close follow-up with PCP for better DM control  Respiratory failure Patient intubated for airway protection Ventilator management per CCM Extubate when able On precedex and fentanyl    AKI and rhabdomyolysis CK peaked at 2418, now 1638 Creatinine 1.37->1.1 today, downtrending Renally dose medications as appropriate IV fluids per primary team  Dysphagia Patient has post-stroke dysphagia, SLP consulted NPO now On TF  Other Stroke Risk Factors Obesity, Body mass index is 36.55 kg/m., BMI >/= 30 associated with increased stroke risk, recommend weight loss, diet and exercise as appropriate  Obstructive sleep apnea  Other Active Problems Breast cancer s/p left lumpectomy  in 2019  Hospital day # 3  Patient seen and examined by NP/APP with MD. MD to update note as needed.   Jorene Last, DNP, FNP-BC [facility] Pager: [phone]  ATTENDING NOTE: I reviewed above note and agree with the assessment and plan. Pt was seen and examined.   No family at bedside.  Patient is still intubated, drowsy sleepy, hardly open eyes with voice and tactile stimulation.  Had a TEE this morning, received additional sedatives.  Now still on Precedex and fentanyl .  Eyes midline, intermittently blinking to visual threat on the right but not on the left.  PERRL.  Right upper  extremity spontaneous movement against gravity with asterixis.  Left upper extremity flaccid.  Bilateral lower extremity slight withdraw to pain summation.  Patient TEE did not show endocarditis.  ID on board, still on antibiotics.  Etiology for patient's stroke not for certain, however still concerning for mild endocarditis.  Hypercoagulable state from sepsis also in differential.  Continue home aspirin .  Hold off statin for now given high CK level.  Encephalopathy and infection management per primary team.  Will follow  For detailed assessment and plan, please refer to above as I have made changes wherever appropriate.   Ary Cummins, MD PhD Stroke Neurology 03/02/2024 6:23 PM  This patient is critically ill due to stroke, bacteremia, possible endocarditis, encephalopathy, respiratory failure and at significant risk of neurological worsening, death form recurrent stroke, hemorrhagic conversion, seizure, heart failure. This patient's care requires constant monitoring of vital signs, hemodynamics, respiratory and cardiac monitoring, review of multiple databases, neurological assessment, discussion with family, other specialists and medical decision making of high complexity. I spent 35 minutes of neurocritical care time in the care of this patient.   To contact Stroke Continuity provider, please refer to [email or web address]. After hours, contact General Neurology

## 2024-03-02 NOTE — Progress Notes (Addendum)
 eLink Physician-Brief Progress Note Patient Name: Melannie Metzner DOB: 1946-03-03 MRN: [id number]   Date of Service  03/02/2024  HPI/Events of Note  Patient is overtly dyssynchronous and desaturating likely secondary to sub-optimal sedation. EKG without acute findings.  eICU Interventions  Fentanyl  gtt reordered with a 200 mcg ceiling, PRN Versed  added,  stat portable CXR.        Brianna Esson U Mir Fullilove 03/02/2024, 3:52 AM

## 2024-03-02 NOTE — IPAL (Signed)
  Interdisciplinary Goals of Care Family Meeting   Date carried out:: 03/02/2024  Location of the meeting: Phone conference  Member's involved: Physician and Family Member or next of kin  Durable Power of Attorney or acting medical decision maker: [name], daughter.    Discussion: We discussed goals of care for [facility] .  I talked to patient's family over the phone regarding her clinical status. They noted that mother had wanted to be DNR and has relayed that to family. They want to honor her wishes. I agree with that. Will change code status to limited code DNR.  Code status: Limited Code or DNR with short term  Disposition: Continue current acute care   Time spent for the meeting: 15 minutes  Victoria [name] 03/02/2024, 1:24 PM

## 2024-03-02 NOTE — Progress Notes (Signed)
 Primary RN made aware that PICC will not be placed tonight. Made aware that we cannot place the PICC in UE until ulrasound resulted. Will follow up in am.

## 2024-03-02 NOTE — Consult Note (Addendum)
 I have seen and examined the patient. I have personally reviewed the clinical findings, laboratory findings, microbiological data and imaging studies. The assessment and treatment plan was discussed with the Nurse Practitioner. I agree with her/his recommendations except following additions/corrections.  24 Y O Female with prior h/o as below including COPD on 2 L nasal cannula at home who presented to the ED after brought in by EMS who found her patient facedown covered in urine, confused, febrile,tachycardic.  At ED, febrile, tachycardic and tachypneic  labs remarkable for WBC 16.3, lactic acid 4.1, BG 228, albumin 3.3, AST 62, CK661, troponin 337  10/13 Blood cx 2/2 sets strep salivarius S/p LP with no signs of bacterial or viral meningitis.  ME panel negative, Gram stain negative, culture no growth in 2 days Was given IVF, started on broad-spectrum antibiotics, intubated for increased respiratory distress  MRI brain concerning for embolic infarcts, mostly in the right MCA distribution TEE today with no vegetation or endocarditis  Exam -elderly female lying in the bed, on Precedex, pupils bilaterally symmetrical, vent sounds, RRR, abdomen nondistended, soft, no pedal edema or signs of septic joint, no rashes  Plan - Continue IV ceftriaxone 2 g IV every 12 - Follow-up repeat blood cultures for clearance - Will need to be treated with prolonged IV antibiotics for endocarditis even though TEE negative due to high suspicion for clinical endocarditis - Monitor CBC, BMP on antibiotics - Universal/standard isolation precaution Following peripherally over the weekend, new ID team to start following from 10/20  I spent 65 minutes involved in face-to-face and non-face-to-face activities for this patient on the day of the visit. Professional time spent includes the following activities: preparing to see the patient (review of tests), obtaining and reviewing separately obtained history (ED note,  H&P, hospitalist progress note), performing a medically appropriate examination, ordering medications, communicating with other health care professionals, documenting clinical information in the EMR and care coordination.    [facility]    Date of Admission:  02/27/2024     Total days of antibiotics 5               Reason for Consult: Streptococcus Bacteremia  Referring Provider:  Primary Care Provider: Arloa Jarvis, NP   ASSESSMENT:  Ms. Mathwig is an 78 year old African-American female with COPD, type 2 diabetes, and chronic kidney disease admitted with altered mental status after being found down at home and found to have Streptococcus salivarius bacteremia and right embolic MCA territory infarct concerning for bacterial endocarditis.  Remains intubated and sedated.    Ms. Rau blood cultures from 02/29/2024 remain without growth to date.  TEE results are pending although findings appear to be consistent with endocarditis given bacteremia and CNS septic emboli even if not present on TEE.  Continue current dose of CNS ceftriaxone with 2 g every 12.  Monitor blood cultures for clearance of bacteremia. Renal function appears stable.  Anticipate she will need a prolonged course of treatment. Standard/universal precautions.  Stroke management per neurology.  Ventilatory and remaining medical and supportive care per PCCM.   PLAN:  Continue current dose of CNS ceftriaxone at 2 g every 12 hours. Monitor blood cultures for clearance of bacteremia. Await TEE results Standard/universal precautions.  Stroke management per Neurology. Ventilatory and remaining medical and supportive care per PCCM.    Principal Problem:   Altered mental status Active Problems:   Cerebrovascular accident (CVA) due to embolism of cerebral artery (HCC)   Bacteremia due to Streptococcus  stroke: early stages of recovery book   Does not apply Once   arformoterol   15 mcg  Nebulization Q12H   aspirin   81 mg Per Tube Daily   atropine       budesonide  (PULMICORT ) nebulizer solution  0.5 mg Nebulization BID   Chlorhexidine  Gluconate Cloth  6 each Topical Daily   docusate  100 mg Per Tube BID   enoxaparin  (LOVENOX ) injection  40 mg Subcutaneous Q24H   insulin  aspart  0-15 Units Subcutaneous Q4H   mouth rinse  15 mL Mouth Rinse Q2H   oxyCODONE  5 mg Per Tube Q6H   pantoprazole (PROTONIX) IV  40 mg Intravenous Q24H   polyethylene glycol  17 g Per Tube Daily   revefenacin   175 mcg Nebulization Daily   roflumilast   500 mcg Per Tube q morning   thiamine  (VITAMIN B1) injection  100 mg Intravenous Daily     HPI: Lynnzie Blackson is a 78 y.o. female with past medical history of COPD on 2 L of oxygen  via nasal cannula at baseline at home, obstructive sleep apnea, hypertension, type 2 diabetes, chronic kidney disease stage IIIa, and left-sided breast cancer presenting to the hospital with altered mental status.  Ms. Whirley was found facedown at home following a wellness check with altered mental status.  Febrile with temperature of 102.5 F and leukocytosis of 12,300.  Chest x-ray with no focal lung infiltrates.  CT head and cervical spine with no acute intracranial or osseous abnormalities.  CT chest with no acute findings.  MRI brain on 02/28/2024 with restricted diffusion laterally within the right mid and posterior temporal lobe and numerous foci of restricted diffusion involving the cortex and subcortical white matter indicating embolic infarcts mostly pronounced right MCA distribution.  Intubated for airway protection.  Started on broad-spectrum antibiotics with vancomycin, metronidazole, and cefepime.  Blood cultures positive for Streptococcus salivarius.  Antibiotics have been narrowed to ceftriaxone.  She remains intubated and sedated with TEE performed and results pending.  Blood cultures from 02/29/2024 are without growth to date.  Lumbar puncture performed with  negative meningitis/encephalitis panel and no evidence of bacterial or viral meningitis.  CK levels elevated and now downtrending.   Review of Systems: Review of Systems  Unable to perform ROS: Intubated     Past Medical History:  Diagnosis Date   Abdominal aneurysm    Dr. Harvey follows lLOV 2 ''17 per pt around 2 cm   Anemia    as a child   Arthritis    left ankle, right knee, right SI joint, wrists, lower back   Breast cancer in female [facility])    Left   COPD (chronic obstructive pulmonary disease) (HCC)    ephysema-Dr. 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 Eletha in [place]   Sleep apnea    cpap used sometimes, uses oxygen  concentrator 2 l/m nasally bedtime   Varicella as child   Past Surgical History:  Procedure Laterality Date  APPENDECTOMY     2008   BREAST LUMPECTOMY Left 08/31/2017   BREAST LUMPECTOMY WITH RADIOACTIVE SEED LOCALIZATION Left 08/10/2017   Procedure: BREAST LUMPECTOMY WITH RADIOACTIVE SEED LOCALIZATION;  Surgeon: Ebbie Cough, MD;  Location: [facility] OR;  Service: General;  Laterality: Left;   CESAREAN SECTION     1972   COLONOSCOPY     COLONOSCOPY WITH PROPOFOL  N/A 04/16/2016   Procedure: COLONOSCOPY WITH PROPOFOL ;  Surgeon: Belvie Just, MD;  Location: [facility] ENDOSCOPY;  Service: Endoscopy;  Laterality: N/A;   CYST REMOVAL NECK Left 04/13/2016   Procedure: EXCISION OF SEBACEOUS CYST LEFT POSTERIOR NECK;  Surgeon: Krystal Spinner, MD;  Location: [facility] OR;  Service: General;  Laterality: Left;   Excision of Pelvic Absess, Right Ovary     2008   RE-EXCISION OF  BREAST CANCER,SUPERIOR MARGINS Left 08/31/2017   Procedure: RE-EXCISION OF LEFT  BREAST MARGINS ERAS PATHWAY;  Surgeon: Ebbie Cough, MD;  Location: [facility] OR;  Service: General;  Laterality: Left;   TUBAL LIGATION  1980    Social History   Tobacco Use   Smoking status: Former    Current packs/day: 0.00    Average packs/day: 1 pack/day for 50.0 years (50.0 ttl pk-yrs)    Types: Cigarettes    Start date: 04/15/1961    Quit date: 04/16/2011    Years since quitting: 12.8   Smokeless tobacco: Never   Tobacco comments:    quit that date when she had to go to ER   Vaping Use   Vaping status: Never Used  Substance Use Topics   Alcohol use: Yes    Alcohol/week: 0.0 standard drinks of alcohol    Comment: rare occasion   Drug use: No    Comment: no marijuana since 2012    Family History  Problem Relation Age of Onset   Heart disease Mother        MI - fatal   Hypertension Mother    Stroke Father 15       fatal   Alzheimer's disease Father    Alzheimer's disease Brother    Hyperlipidemia Brother    Hypertension Brother    Diabetes Brother    Hypertension Brother    Hyperlipidemia Brother    Breast cancer Paternal Grandmother     Allergies  Allergen Reactions   Benicar  [Olmesartan ] Swelling    Swelling of face and arms    [name]  [Valsartan ] Swelling    Swelling of face and arms    Hydrocodone -Acetaminophen  Nausea And Vomiting    Severe vomiting   Lisinopril  Cough   Monosodium Glutamate Other (See Comments)    Facial swelling per pt   Codeine Other (See Comments)    jittery   Lead Acetate Rash   Nickel Rash    Severe rash to infection: pt is allergic to all metals other than sterling silver or gold jewelry.    Penicillins Other (See Comments)    Unknown reaction per family at the bedside    OBJECTIVE: Blood pressure (!) 155/65, pulse (!) 57, temperature 99 F (37.2 C), resp. rate 20, height 5' 4.5 (1.638 m), weight 98.1 kg, SpO2 96%.  Physical  Exam Constitutional:      General: She is not in acute distress.    Appearance: She is well-developed.     Interventions: She is sedated and intubated.  Cardiovascular:     Rate and Rhythm: Normal rate and regular rhythm.     Heart sounds: Normal heart sounds.  Pulmonary:  Effort: Pulmonary effort is normal. She is intubated.     Breath sounds: Normal breath sounds.  Skin:    General: Skin is warm and dry.     Lab Results Lab Results  Component Value Date   WBC 12.3 (H) 03/01/2024   HGB 10.5 (L) 03/01/2024   HCT 33.8 (L) 03/01/2024   MCV 89.7 03/01/2024   PLT 132 (L) 03/01/2024    Lab Results  Component Value Date   CREATININE 1.10 (H) 03/01/2024   BUN 30 (H) 03/01/2024   NA 135 03/01/2024   K 4.2 03/01/2024   CL 102 03/01/2024   CO2 24 03/01/2024    Lab Results  Component Value Date   ALT 43 02/27/2024   AST 62 (H) 02/27/2024   ALKPHOS 91 02/27/2024   BILITOT 0.8 02/27/2024     Microbiology: Recent Results (from the past 240 hours)  Resp panel by RT-PCR (RSV, Flu A&B, Covid) Anterior Nasal Swab     Status: None   Collection Time: 02/27/24  9:13 PM   Specimen: Anterior Nasal Swab  Result Value Ref Range Status   SARS Coronavirus 2 by RT PCR NEGATIVE NEGATIVE Final   Influenza A by PCR NEGATIVE NEGATIVE Final   Influenza B by PCR NEGATIVE NEGATIVE Final    Comment: (NOTE) The Xpert Xpress SARS-CoV-2/FLU/RSV plus assay is intended as an aid in the diagnosis of influenza from Nasopharyngeal swab specimens and should not be used as a sole basis for treatment. Nasal washings and aspirates are unacceptable for Xpert Xpress SARS-CoV-2/FLU/RSV testing.  Fact Sheet for Patients: BloggerCourse.com  Fact Sheet for Healthcare Providers: SeriousBroker.it  This test is not yet approved or cleared by the [place]  FDA and has been authorized for detection and/or diagnosis of SARS-CoV-2 by FDA under an Emergency  Use Authorization (EUA). This EUA will remain in effect (meaning this test can be used) for the duration of the COVID-19 declaration under Section 564(b)(1) of the Act, 21 U.S.C. section 360bbb-3(b)(1), unless the authorization is terminated or revoked.     Resp Syncytial Virus by PCR NEGATIVE NEGATIVE Final    Comment: (NOTE) Fact Sheet for Patients: BloggerCourse.com  Fact Sheet for Healthcare Providers: SeriousBroker.it  This test is not yet approved or cleared by the [place]  FDA and has been authorized for detection and/or diagnosis of SARS-CoV-2 by FDA under an Emergency Use Authorization (EUA). This EUA will remain in effect (meaning this test can be used) for the duration of the COVID-19 declaration under Section 564(b)(1) of the Act, 21 U.S.C. section 360bbb-3(b)(1), unless the authorization is terminated or revoked.  Performed at [facility], [address], [place], [place] [address]   Blood Culture (routine x 2)     Status: Abnormal   Collection Time: 02/27/24  9:13 PM   Specimen: BLOOD  Result Value Ref Range Status   Specimen Description BLOOD SITE NOT SPECIFIED  Final   Special Requests   Final    BOTTLES DRAWN AEROBIC AND ANAEROBIC Blood Culture adequate volume   Culture  Setup Time   Final    GRAM POSITIVE COCCI IN CHAINS ANAEROBIC BOTTLE ONLY    Culture (A)  Final    STREPTOCOCCUS SALIVARIUS SUSCEPTIBILITIES PERFORMED ON PREVIOUS CULTURE WITHIN THE LAST 5 DAYS. Performed at [facility], [address], [place], [place] [address]    Report Status 03/01/2024 FINAL  Final  Blood Culture (routine x 2)     Status: Abnormal   Collection Time: 02/27/24  9:18 PM   Specimen: BLOOD LEFT ARM  Result Value Ref Range Status   Specimen Description BLOOD LEFT ARM  Final   Special Requests   Final    BOTTLES DRAWN AEROBIC AND ANAEROBIC Blood Culture adequate volume   Culture  Setup Time   Final     GRAM POSITIVE COCCI IN CHAINS IN BOTH AEROBIC AND ANAEROBIC BOTTLES CRITICAL RESULT CALLED TO, READ BACK BY AND VERIFIED WITH: PHARMD K. HAMMONS [id number] @ 1542 FH Performed at [facility], [address] [place], [place] [address]    Culture STREPTOCOCCUS SALIVARIUS (A)  Final   Report Status 03/01/2024 FINAL  Final   Organism ID, Bacteria STREPTOCOCCUS SALIVARIUS  Final      Susceptibility   Streptococcus salivarius - MIC*    PENICILLIN  0.12 SENSITIVE Sensitive     CEFTRIAXONE <=0.12 SENSITIVE Sensitive     ERYTHROMYCIN 2 RESISTANT Resistant     LEVOFLOXACIN  2 SENSITIVE Sensitive     VANCOMYCIN 1 SENSITIVE Sensitive     * STREPTOCOCCUS SALIVARIUS  Blood Culture ID Panel (Reflexed)     Status: Abnormal   Collection Time: 02/27/24  9:18 PM  Result Value Ref Range Status   Enterococcus faecalis NOT DETECTED NOT DETECTED Final   Enterococcus Faecium NOT DETECTED NOT DETECTED Final   Listeria monocytogenes NOT DETECTED NOT DETECTED Final   Staphylococcus species NOT DETECTED NOT DETECTED Final   Staphylococcus aureus (BCID) NOT DETECTED NOT DETECTED Final   Staphylococcus epidermidis NOT DETECTED NOT DETECTED Final   Staphylococcus lugdunensis NOT DETECTED NOT DETECTED Final   Streptococcus species DETECTED (A) NOT DETECTED Final    Comment: Not Enterococcus species, Streptococcus agalactiae, Streptococcus pyogenes, or Streptococcus pneumoniae. CRITICAL RESULT CALLED TO, READ BACK BY AND VERIFIED WITH: PHARMD K. HAMMONS [id number] @ 1542 FH    Streptococcus agalactiae NOT DETECTED NOT DETECTED Final   Streptococcus pneumoniae NOT DETECTED NOT DETECTED Final   Streptococcus pyogenes NOT DETECTED NOT DETECTED Final   A.calcoaceticus-baumannii NOT DETECTED NOT DETECTED Final   Bacteroides fragilis NOT DETECTED NOT DETECTED Final   Enterobacterales NOT DETECTED NOT DETECTED Final   Enterobacter cloacae complex NOT DETECTED NOT DETECTED Final   Escherichia coli NOT DETECTED NOT  DETECTED Final   Klebsiella aerogenes NOT DETECTED NOT DETECTED Final   Klebsiella oxytoca NOT DETECTED NOT DETECTED Final   Klebsiella pneumoniae NOT DETECTED NOT DETECTED Final   Proteus species NOT DETECTED NOT DETECTED Final   Salmonella species NOT DETECTED NOT DETECTED Final   Serratia marcescens NOT DETECTED NOT DETECTED Final   Haemophilus influenzae NOT DETECTED NOT DETECTED Final   Neisseria meningitidis NOT DETECTED NOT DETECTED Final   Pseudomonas aeruginosa NOT DETECTED NOT DETECTED Final   Stenotrophomonas maltophilia NOT DETECTED NOT DETECTED Final   Candida albicans NOT DETECTED NOT DETECTED Final   Candida auris NOT DETECTED NOT DETECTED Final   Candida glabrata NOT DETECTED NOT DETECTED Final   Candida krusei NOT DETECTED NOT DETECTED Final   Candida parapsilosis NOT DETECTED NOT DETECTED Final   Candida tropicalis NOT DETECTED NOT DETECTED Final   Cryptococcus neoformans/gattii NOT DETECTED NOT DETECTED Final    Comment: Performed at [facility], [address] [place], [place] [address]  MRSA Next Gen by PCR, Nasal     Status: None   Collection Time: 02/28/24  5:41 AM   Specimen: Nasal Mucosa; Nasal Swab  Result Value Ref Range Status   MRSA by PCR Next Gen NOT DETECTED NOT DETECTED Final  Comment: (NOTE) The GeneXpert MRSA Assay (FDA approved for NASAL specimens only), is one component of a comprehensive MRSA colonization surveillance program. It is not intended to diagnose MRSA infection nor to guide or monitor treatment for MRSA infections. Test performance is not FDA approved in patients less than 63 years old. Performed at [facility], [address], [place], [place] [address]   CSF culture w Gram Stain     Status: None (Preliminary result)   Collection Time: 02/29/24  3:39 PM   Specimen: CSF; Cerebrospinal Fluid  Result Value Ref Range Status   Specimen Description CSF  Final   Special Requests NONE  Final   Gram Stain   Final    WBC  PRESENT,BOTH PMN AND MONONUCLEAR NO ORGANISMS SEEN CYTOSPIN SMEAR    Culture   Final    NO GROWTH 2 DAYS Performed at [facility], [address], [place], [place] [address]    Report Status PENDING  Incomplete  VZV PCR, CSF     Status: None   Collection Time: 02/29/24  3:39 PM   Specimen: Cerebrospinal Fluid  Result Value Ref Range Status   VZV PCR, CSF Negative Negative Final    Comment: (NOTE) No Varicella Zoster Virus DNA detected. Performed At: [facility] [address] [place], [place] [id number] Jennette Shorter MD Ey:[id number]   Culture, blood (Routine X 2) w Reflex to ID Panel     Status: None (Preliminary result)   Collection Time: 02/29/24  4:00 PM   Specimen: BLOOD RIGHT FOREARM  Result Value Ref Range Status   Specimen Description BLOOD RIGHT FOREARM  Final   Special Requests   Final    BOTTLES DRAWN AEROBIC ONLY Blood Culture adequate volume   Culture   Final    NO GROWTH 2 DAYS Performed at [facility], [address], [place], [place] [address]    Report Status PENDING  Incomplete  Culture, blood (Routine X 2) w Reflex to ID Panel     Status: None (Preliminary result)   Collection Time: 02/29/24  4:00 PM   Specimen: BLOOD  Result Value Ref Range Status   Specimen Description BLOOD RIGHT ANTECUBITAL  Final   Special Requests   Final    BOTTLES DRAWN AEROBIC AND ANAEROBIC Blood Culture adequate volume   Culture   Final    NO GROWTH 2 DAYS Performed at [facility], [address] [place], [place] [address]    Report Status PENDING  Incomplete   Imaging  DG CHEST PORT 1 VIEW Result Date: 03/02/2024 CLINICAL DATA:  [id number] Hypoxia [id number] EXAM: PORTABLE CHEST 1 VIEW COMPARISON:  03/02/2024. FINDINGS: Low lung volume. Bilateral lung fields are clear. Bilateral costophrenic angles are clear. Stable cardio-mediastinal silhouette. No acute osseous abnormalities. The soft tissues are within normal limits. An enteric tube extends below  diaphragm, tip out of the view of the radiograph. Endotracheal tube is not well seen.  Correlate with prior exam. IMPRESSION: No active disease. Electronically Signed   By: Ree Molt M.D.   On: 03/02/2024 14:32   ECHO TEE Result Date: 03/02/2024    TRANSESOPHOGEAL ECHO REPORT   Patient Name:   CALIA NAPP Date of Exam: 03/02/2024 Medical Rec #:  [id number]            Height:       64.5 in Accession #:    [id number]           Weight:  216.3 lb Date of Birth:  1945/08/01            BSA:          2.033 m Patient Age:    78 years             BP:           146/117 mmHg Patient Gender: F                    HR:           50 bpm. Exam Location:  Inpatient Procedure: Transesophageal Echo, 3D Echo and Color Doppler (Both Spectral and            Color Flow Doppler were utilized during procedure). Indications:     Endocarditis  History:         Patient has prior history of Echocardiogram examinations, most                  recent 02/29/2024. COPD; Risk Factors:Hypertension,                  Dyslipidemia, Sleep Apnea and Diabetes.  Sonographer:     Philomena Daring Referring Phys:  [id number] BENJAMIN J STONER Diagnosing Phys: Morene Brownie PROCEDURE: After discussion of the risks and benefits of a TEE, an informed consent was obtained from a family member. The transesophogeal probe was passed without difficulty through the esophogus of the patient. Imaged were obtained with the patient in a left lateral decubitus position. Sedation performed by performing physician. Image quality was good. The patient developed Mild vagal event during intubation during the procedure.  IMPRESSIONS  1. Left ventricular ejection fraction, by estimation, is 55 to 60%. The left ventricle has normal function. The left ventricle has no regional wall motion abnormalities.  2. Right ventricular systolic function is normal. The right ventricular size is normal.  3. Left atrial size was mildly dilated. No left atrial/left atrial appendage  thrombus was detected.  4. The mitral valve is degenerative. Trivial mitral valve regurgitation. No evidence of mitral stenosis. Moderate mitral annular calcification.  5. The aortic valve is tricuspid. There is mild calcification of the aortic valve. There is mild thickening of the aortic valve. Aortic valve regurgitation is not visualized. Aortic valve sclerosis is present, with no evidence of aortic valve stenosis.  6. There is Moderate (Grade III) plaque.  7. 3D performed of the mitral valve and 3D performed of the aortic valve and demonstrates No evidence of cardiac vegetation. Conclusion(s)/Recommendation(s): No evidence of vegetation/infective endocarditis on this transesophageael echocardiogram. FINDINGS  Left Ventricle: Left ventricular ejection fraction, by estimation, is 55 to 60%. The left ventricle has normal function. The left ventricle has no regional wall motion abnormalities. The left ventricular internal cavity size was normal in size. There is  no left ventricular hypertrophy. Right Ventricle: The right ventricular size is normal. No increase in right ventricular wall thickness. Right ventricular systolic function is normal. Left Atrium: Left atrial size was mildly dilated. No left atrial/left atrial appendage thrombus was detected. Right Atrium: Right atrial size was normal in size. Pericardium: There is no evidence of pericardial effusion. Mitral Valve: Calcified nodule on the posterior chord, but no evidence of vegetation. The mitral valve is degenerative in appearance. Moderate mitral annular calcification. Trivial mitral valve regurgitation. No evidence of mitral valve stenosis. Tricuspid Valve: The tricuspid valve is normal in structure. Tricuspid valve regurgitation is mild. There is no evidence of tricuspid valve vegetation. Aortic  Valve: The aortic valve is tricuspid. There is mild calcification of the aortic valve. There is mild thickening of the aortic valve. Aortic valve regurgitation  is not visualized. Aortic valve sclerosis is present, with no evidence of aortic valve stenosis. There is no evidence of aortic valve vegetation. Pulmonic Valve: The pulmonic valve was normal in structure. Pulmonic valve regurgitation is trivial. Aorta: The aortic root and ascending aorta are structurally normal, with no evidence of dilitation. There is moderate (Grade III) plaque. IAS/Shunts: No atrial level shunt detected by color flow Doppler. Additional Comments: 3D was performed not requiring image post processing on an independent workstation and was normal. Morene Brownie Electronically signed by Morene Brownie Signature Date/Time: 03/02/2024/1:57:36 PM    Final    DG CHEST PORT 1 VIEW Result Date: 03/02/2024 EXAM: 1 AP VIEW XRAY OF THE CHEST 03/02/2024 07:56:00 AM COMPARISON: AP radiograph chest dated 03/02/2024. CLINICAL HISTORY: Endotracheally intubated. FINDINGS: LINES, TUBES AND DEVICES: An endotracheal tube and gastric tube remain in satisfactory position. LUNGS AND PLEURA: There is mild hazy opacification of the lower lung zones. No pulmonary edema. No pleural effusion. No pneumothorax. HEART AND MEDIASTINUM: No acute abnormality of the cardiac and mediastinal silhouettes. BONES AND SOFT TISSUES: No acute osseous abnormality. IMPRESSION: 1. Mild hazy opacification of the lower lung zones. 2. Endotracheal tube and gastric tube in satisfactory position. Electronically signed by: Evalene Coho MD 03/02/2024 07:59 AM EDT RP Workstation: [id number]   US  EKG SITE RITE Result Date: 03/02/2024 If Site Rite image not attached, placement could not be confirmed due to current cardiac rhythm.  DG Chest Port 1 View Result Date: 03/02/2024 EXAM: 1 VIEW XRAY OF THE CHEST 03/02/2024 04:15:00 AM COMPARISON: Portal chest 02/28/2024. CLINICAL HISTORY: Oxygen  desaturation. FINDINGS: LINES, TUBES AND DEVICES: ETT has been pulled back to 5.6 cm from the carina. NGT is within the stomach but the side hole is  just below the hiatus and could be advanced further in 5 to 8 cm for better insertion. LUNGS AND PLEURA: Atelectatic bands are again present in both bases. No focal pneumonia. The lungs are emphysematous and otherwise clear. No pulmonary edema. No pleural effusion. No pneumothorax. HEART AND MEDIASTINUM: Stable cardiomegaly. Central vascular prominence is similar, but with no overt edema. Stable mediastinum. There is aortic atherosclerosis. BONES AND SOFT TISSUES: No acute osseous abnormality. IMPRESSION: 1. Endotracheal tube tip 5.6 cm above the carina; nasogastric tube in the stomach with side port just below the hiatus and may be advanced 58 cm for optimal positioning. 2. No acute chest process. 3. Stable cardiomegaly without overt pulmonary edema. 4. Bibasilar atelectasis without focal pneumonia. 5. Emphysema. 6. Aortic atherosclerosis. Electronically signed by: Francis Quam MD 03/02/2024 05:28 AM EDT RP Workstation: [id number]   US  RENAL Result Date: 03/01/2024 CLINICAL DATA:  Acute renal insufficiency. EXAM: RENAL / URINARY TRACT ULTRASOUND COMPLETE COMPARISON:  CT dated 02/28/2024. FINDINGS: Evaluation is limited due to body habitus. Right Kidney: Renal measurements: 11.5 x 5.1 x 4.9 cm = volume: 152 mL. A 1 cm interpolar cyst. No hydronephrosis or shadowing stone. Left Kidney: Renal measurements: 12.1 x 6.4 x 4.4 cm = volume: 178 mL. Normal echogenicity. No hydronephrosis or shadowing stone. Bladder: Decompressed by Foley catheter and not visualized. Other: None. IMPRESSION: No hydronephrosis or shadowing stone. Electronically Signed   By: Vanetta Chou M.D.   On: 03/01/2024 21:07   CT ANGIO HEAD NECK W WO CM Result Date: 03/01/2024 EXAM: CTA HEAD AND NECK WITHOUT AND WITH 03/01/2024 11:02:16 AM TECHNIQUE: CTA  of the head and neck was performed without and with the administration of 75 mL of intravenous contrast (iohexol  (OMNIPAQUE ) 350 MG/ML injection 75 mL IOHEXOL  350 MG/ML SOLN). Multiplanar  2D and/or 3D reformatted images are provided for review. Automated exposure control, iterative reconstruction, and/or weight based adjustment of the mA/kV was utilized to reduce the radiation dose to as low as reasonably achievable. Stenosis of the internal carotid arteries measured using NASCET criteria. COMPARISON: MRI head 02/28/2024 and MRA head 02/29/2024 CLINICAL HISTORY: Stroke/TIA, determine embolic source. Chief complaints; Code Sepsis; CT ANGIO HEAD NECK W WO CM; Stroke/TIA, determine embolic source; 75ml Omni350 FINDINGS: CTA NECK: AORTIC ARCH AND ARCH VESSELS: Mild atherosclerosis of the visualized aortic arch. No dissection or arterial injury. No significant stenosis of the brachiocephalic or subclavian arteries. CERVICAL CAROTID ARTERIES: The right carotid artery is patent from the origin to the skull base. Mild tortuosity of the proximal right common carotid artery. Atherosclerosis at the right carotid bifurcation without hemodynamically significant stenosis. The left carotid artery is patent from the origin to the skull base. Calcified atherosclerosis at the left carotid bifurcation without hemodynamically significant stenosis. No dissection or arterial injury. CERVICAL VERTEBRAL ARTERIES: The right vertebral artery is dominant. Atherosclerosis at the right vertebral artery origin results in mild stenosis. Atherosclerosis at the origin of the nondominant left vertebral artery resulting in moderate to severe stenosis. The vertebral arteries are patent to the vertebrobasilar confluence. No dissection or arterial injury. LUNGS AND MEDIASTINUM: Partially visualized endotracheal tube and enteric tube. Heterogeneous appearance of the thyroid  with multiple nodules. The largest nodule in the left thyroid  lobe measures up to 2.7 cm. Follow up recommendations from thyroid  ultrasound in 2022. Emphysema. SOFT TISSUES: Fluid layering in the nasopharynx. Scattered mucosal thickening throughout the paranasal  sinuses particularly in the ethmoid and sphenoid sinuses. BONES: Degenerative changes in the visualized spine. No acute abnormality. CTA HEAD: ANTERIOR CIRCULATION: The intracranial internal carotid arteries are patent bilaterally. Atherosclerosis of the carotid siphons with mild stenosis of the bilateral cavernous ICAs. The middle cerebral arteries are patent bilaterally. The anterior cerebral arteries are patent bilaterally. No aneurysm. POSTERIOR CIRCULATION: No significant stenosis of the posterior cerebral arteries. No significant stenosis of the basilar artery. No aneurysm. OTHER: Hypoattenuation in the posterior and lateral aspects of the right temporal lobe corresponding to known acute infarct seen on recent MRI. Nonspecific hypoattenuation in the periventricular and subcortical white matter, most likely representing chronic microvascular ischemic changes. Mild parenchymal volume loss. No dural venous sinus thrombosis on this non-dedicated study. IMPRESSION: 1. 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 2. No large vessel occlusion or hemodynamically significant stenosis in the head or neck. 3. Atherosclerosis at the origin of the nondominant left vertebral artery resulting in moderate to severe stenosis. 4. Mild atherosclerosis of the aortic arch and carotid arteries without hemodynamically significant stenosis. 5. Mild stenosis of the right vertebral artery origin. 6. Mild stenosis of the bilateral cavernous internal carotid arteries. 7. Emphysema. Electronically signed by: Donnice Mania MD 03/01/2024 11:33 AM EDT RP Workstation: [id number]   ECHOCARDIOGRAM COMPLETE Result Date: 02/29/2024    ECHOCARDIOGRAM REPORT   Patient Name:   JAIME COSME MOLT Date of Exam: 02/29/2024 Medical Rec #:  [id number]            Height:       64.5 in Accession #:    [id number]           Weight:  186.0 lb Date of Birth:  1945/07/02            BSA:           1.907 m Patient Age:    78 years             BP:           106/48 mmHg Patient Gender: F                    HR:           74 bpm. Exam Location:  Inpatient Procedure: 2D Echo, Cardiac Doppler and Color Doppler (Both Spectral and Color            Flow Doppler were utilized during procedure). Indications:    Stroke  History:        Patient has no prior history of Echocardiogram examinations.                 COPD and Stroke, Arrythmias:Tachycardia, Signs/Symptoms:Murmur                 and Dyspnea; Risk Factors:Dyslipidemia, Hypertension, Diabetes                 and Former Smoker.  Sonographer:    Juliene Rucks Referring Phys: [id number] PAULA SOUTHERLY  Sonographer Comments: Technically difficult study due to poor echo windows, suboptimal parasternal window, suboptimal apical window, suboptimal subcostal window, echo performed with patient supine and on artificial respirator and patient is obese. Image acquisition challenging due to COPD, Image acquisition challenging due to patient body habitus and Image acquisition challenging due to respiratory motion. IMPRESSIONS  1. Technically difficult; cannot R/O SOE with this study; if clinically indicated TEE would have greater sensitivity.  2. Left ventricular ejection fraction, by estimation, is 60 to 65%. The left ventricle has normal function. The left ventricle has no regional wall motion abnormalities. Left ventricular diastolic parameters are consistent with Grade I diastolic dysfunction (impaired relaxation).  3. Right ventricular systolic function is normal. The right ventricular size is normal.  4. The mitral valve is normal in structure. No evidence of mitral valve regurgitation. No evidence of mitral stenosis.  5. The aortic valve has an indeterminant number of cusps. Aortic valve regurgitation is not visualized. No aortic stenosis is present.  6. The inferior vena cava is normal in size with greater than 50% respiratory variability, suggesting right atrial pressure  of 3 mmHg. FINDINGS  Left Ventricle: Left ventricular ejection fraction, by estimation, is 60 to 65%. The left ventricle has normal function. The left ventricle has no regional wall motion abnormalities. The left ventricular internal cavity size was normal in size. There is  no left ventricular hypertrophy. Left ventricular diastolic parameters are consistent with Grade I diastolic dysfunction (impaired relaxation). Right Ventricle: The right ventricular size is normal. Right ventricular systolic function is normal. Left Atrium: Left atrial size was normal in size. Right Atrium: Right atrial size was normal in size. Pericardium: There is no evidence of pericardial effusion. Mitral Valve: The mitral valve is normal in structure. Mild mitral annular calcification. No evidence of mitral valve regurgitation. No evidence of mitral valve stenosis. Tricuspid Valve: The tricuspid valve is normal in structure. Tricuspid valve regurgitation is not demonstrated. No evidence of tricuspid stenosis. Aortic Valve: The aortic valve has an indeterminant number of cusps. Aortic valve regurgitation is not visualized. No aortic stenosis is present. Pulmonic Valve: The pulmonic valve was not well visualized. Pulmonic valve regurgitation is  not visualized. No evidence of pulmonic stenosis. Aorta: The aortic root is normal in size and structure. Venous: The inferior vena cava is normal in size with greater than 50% respiratory variability, suggesting right atrial pressure of 3 mmHg. IAS/Shunts: The interatrial septum was not well visualized. Additional Comments: Technically difficult; cannot R/O SOE with this study; if clinically indicated TEE would have greater sensitivity.  RIGHT VENTRICLE             IVC RV S prime:     10.40 cm/s  IVC diam: 1.60 cm AORTIC VALVE LVOT Vmax:   84.80 cm/s LVOT Vmean:  56.900 cm/s LVOT VTI:    0.175 m MITRAL VALVE MV Area (PHT): 3.08 cm     SHUNTS MV Decel Time: 246 msec     Systemic VTI: 0.18 m MV E  velocity: 64.50 cm/s MV A velocity: 114.00 cm/s MV E/A ratio:  0.57 Redell Shallow MD Electronically signed by Redell Shallow MD Signature Date/Time: 02/29/2024/12:34:59 PM    Final    MR ANGIO HEAD WO CONTRAST Result Date: 02/29/2024 CLINICAL DATA:  Stroke follow-up EXAM: MRA HEAD WITHOUT CONTRAST TECHNIQUE: Angiographic images of the Circle of Willis were acquired using MRA technique without intravenous contrast. COMPARISON:  None Available. FINDINGS: MRA intracranial: Right-side: The internal carotid artery is patent without significant stenosis. The middle cerebral artery is patent without significant stenosis or proximal branch occlusion. The anterior cerebral artery is patent without significant stenosis or proximal branch occlusion. No aneurysm. Left side: The internal carotid artery is patent without significant stenosis. The middle cerebral artery is patent without significant stenosis or proximal branch occlusion. The anterior cerebral artery is patent without significant stenosis or proximal branch occlusion. No aneurysm. Posterior circulation: Both vertebral arteries are patent. The basilar artery is patent without significant stenosis. Both posterior cerebral arteries are patent. No aneurysm. Other comments: None IMPRESSION: Normal Electronically Signed   By: Nancyann Burns M.D.   On: 02/29/2024 11:50   MR BRAIN W WO CONTRAST Result Date: 02/29/2024 EXAM: MRI BRAIN WITH AND WITHOUT CONTRAST 02/28/2024 11:32:50 PM TECHNIQUE: Multiplanar multisequence MRI of the head/brain was performed with and without the administration of intravenous contrast. 8 mL (gadobutrol (GADAVIST) 1 MMOL/ML injection 8 mL GADOBUTROL 1 MMOL/ML IV SOLN). COMPARISON: CT of the head dated 02/27/2024 and MRI of the head dated 01/25/2021. CLINICAL HISTORY: Altered mental status, nontraumatic (Ped 0-17y); Came in with AMS and intubated. Already on sedation. FINDINGS: BRAIN AND VENTRICLES: There is restricted diffusion present  laterally within the right mid and posterior temporal lobe. There are also numerous foci of restricted diffusion involving the cortex and subcortical white matter of the cerebral artery disease bilaterally. No acute intracranial hemorrhage. No mass effect or midline shift. No hydrocephalus. The sella is unremarkable. Normal flow voids. No mass or abnormal enhancement. There is age-related atrophy and moderate periventricular and deep cerebral white matter disease. ORBITS: No acute abnormality. SINUSES: No acute abnormality. BONES AND SOFT TISSUES: Normal bone marrow signal and enhancement. No acute soft tissue abnormality. IMPRESSION: 1. Restricted diffusion laterally within the right mid and posterior temporal lobe and numerous foci of restricted diffusion involving the cortex and subcortical white matter of the cerebral artery disease bilaterally. Involvement of multiple vascular territories indicates embolic infarcts most pronounced in the right MCA distribution. 2. Age-related atrophy and moderate periventricular and deep cerebral white matter disease. Electronically signed by: Evalene Coho MD 02/29/2024 04:44 AM EDT RP Workstation: [id number]   EEG adult Result Date: 02/28/2024 Shelton Arlin KIDD,  MD     02/28/2024  1:05 PM Patient Name: Kenslei Hearty MRN: [id number] Epilepsy Attending: Arlin MALVA Krebs Referring Physician/Provider: Emilio Norleen BIRCH, PA-C Date: 02/28/2024 Duration: 23.21 mins Patient history: 78yo F with ams. EEG to evaluate for seizure Level of alertness: comatose AEDs during EEG study: Propofol  Technical aspects: This EEG study was done with scalp electrodes positioned according to the 10-20 International system of electrode placement. Electrical activity was reviewed with band pass filter of 1-70Hz , sensitivity of 7 uV/mm, display speed of 65mm/sec with a 60Hz  notched filter applied as appropriate. EEG data were recorded continuously and digitally stored.  Video monitoring was  available and reviewed as appropriate. Description: EEG showed continuous generalized 3 to 6 Hz theta-delta slowing. Hyperventilation and photic stimulation were not performed.   ABNORMALITY - Continuous slow, generalized IMPRESSION: This study is suggestive generalized nonspecific dysfunction (encephalopathy). No seizures or epileptiform discharges were seen throughout the recording. Arlin MALVA Krebs   DG Abdomen 1 View Result Date: 02/28/2024 EXAM: 1 VIEW XRAY OF THE ABDOMEN 02/28/2024 03:22:00 AM COMPARISON: CT abdomen/pelvis dated 02/28/2024. CLINICAL HISTORY: OG tube placement. Intubation/ OG tube placement FINDINGS: LINES, TUBES AND DEVICES: Enteric tube terminates in the distal stomach. BOWEL: Nonobstructive bowel gas pattern. SOFT TISSUES: Excreted IV contrast in the renal collecting systems and ureters. No opaque urinary calculi. BONES: No acute osseous abnormality. IMPRESSION: 1. Enteric tube tip in the distal stomach, appropriately positioned. Electronically signed by: Pinkie Pebbles MD 02/28/2024 03:33 AM EDT RP Workstation: [id number]   DG Chest Portable 1 View Result Date: 02/28/2024 EXAM: 1 VIEW(S) XRAY OF THE CHEST 02/28/2024 03:22:00 AM COMPARISON: CT chest dated 02/28/2024. CLINICAL HISTORY: intubation. Intubation/ OG tube placement. FINDINGS: LINES, TUBES AND DEVICES: Endotracheal tube is obscured distally but likely terminates at 5 cm above carina. Enteric tube courses into the stomach. LUNGS AND PLEURA: Emphysematous changes with mild linear scarring. Bibasilar linear opacities. Blunting of left lateral costophrenic angle. No pulmonary edema. No pleural effusion. No pneumothorax. HEART AND MEDIASTINUM: Aortic arch atherosclerosis. Blunting of left lateral costophrenic angle corresponds to epicardial fat on CT. No acute abnormality of the cardiac and mediastinal silhouettes. BONES AND SOFT TISSUES: No acute osseous abnormality. IMPRESSION: 1. Endotracheal tube tip likely 5 cm above the  carina. 2. Enteric tube courses into the stomach. Electronically signed by: Pinkie Pebbles MD 02/28/2024 03:32 AM EDT RP Workstation: [id number]   CT CHEST ABDOMEN PELVIS W CONTRAST Result Date: 02/28/2024 EXAM: CT CHEST, ABDOMEN AND PELVIS WITH CONTRAST 02/28/2024 01:27:30 AM TECHNIQUE: CT of the chest, abdomen and pelvis was performed with the administration of 75 mL of iohexol  (OMNIPAQUE ) 350 MG/ML injection. Multiplanar reformatted images are provided for review. Automated exposure control, iterative reconstruction, and/or weight based adjustment of the mA/kV was utilized to reduce the radiation dose to as low as reasonably achievable. COMPARISON: None available. CLINICAL HISTORY: Sepsis, unknown source, hematuria. Patient unable to follow breathing instructions, labored breathing. FINDINGS: LIMITATIONS/ARTIFACTS: Motion degraded images. CHEST: MEDIASTINUM AND LYMPH NODES: Heart and pericardium are unremarkable. The central airways are clear. No mediastinal, hilar or axillary lymphadenopathy. Thoracic aortic atherosclerosis. Moderate 3-vessel coronary atherosclerosis. LUNGS AND PLEURA: Moderate centrilobular and paraseptal emphysematous changes, upper lung predominant. Mild lingular and bilateral lower lobes atelectasis. No pleural effusion or pneumothorax. ABDOMEN AND PELVIS: LIVER: The liver is unremarkable. GALLBLADDER AND BILE DUCTS: Gallbladder is unremarkable. No biliary ductal dilatation. SPLEEN: No acute abnormality. PANCREAS: No acute abnormality. ADRENAL GLANDS: No acute abnormality. KIDNEYS, URETERS AND BLADDER: Bilateral renal cysts, measuring up to 2.6  cm in the left lower kidney (image 62), benign (Bosniak 1). Per consensus, no follow-up is needed for simple Bosniak type 1 and 2 renal cysts, unless the patient has a malignancy history or risk factors. No stones in the kidneys or ureters. No hydronephrosis. No perinephric or periureteral stranding. Urinary bladder is unremarkable. GI AND  BOWEL: Stomach demonstrates no acute abnormality. Sigmoid diverticulosis, without evidence of diverticulitis. Appendix is not discretely visualized, favored to be surgically absent. There is no bowel obstruction. REPRODUCTIVE ORGANS: Uterus is unremarkable. PERITONEUM AND RETROPERITONEUM: No ascites. No free air. VASCULATURE: Aorta is normal in caliber. ABDOMINAL AND PELVIS LYMPH NODES: No lymphadenopathy. BONES AND SOFT TISSUES: Mild degenerative changes of the lower thoracic spine. Small fat-containing periumbilical hernia. No acute osseous abnormality. No focal soft tissue abnormality. IMPRESSION: 1. No acute findings. 2. Ancillary findings, as above. Electronically signed by: Pinkie Pebbles MD 02/28/2024 01:33 AM EDT RP Workstation: [id number]   CT Head Wo Contrast Result Date: 02/27/2024 EXAM: CT HEAD AND CERVICAL SPINE 02/27/2024 10:01:36 PM TECHNIQUE: CT of the head and cervical spine was performed without the administration of intravenous contrast. Multiplanar reformatted images are provided for review. Automated exposure control, iterative reconstruction, and/or weight based adjustment of the mA/kV was utilized to reduce the radiation dose to as low as reasonably achievable. COMPARISON: None available. CLINICAL HISTORY: Head trauma, minor (Age >= 65y). Pt LKW by neighbors was 2 days ago. Called for wellness check. Fire and ems broke down door at home, found pt face down covered in urine. Confused. Pulling at cords. Pt has hx of emphysema wears O2 2L at base. Pt currently confused and uncooperative. FINDINGS: CT HEAD BRAIN AND VENTRICLES: No acute intracranial hemorrhage. No mass effect or midline shift. No abnormal extra-axial fluid collection. No evidence of acute infarct. No hydrocephalus. Global cortical atrophy. Subcortical and periventricular small vessel ischemic changes. Intracranial atherosclerosis. ORBITS: No acute abnormality. SINUSES AND MASTOIDS: No acute abnormality. SOFT TISSUES AND  SKULL: No acute skull fracture. No acute soft tissue abnormality. CT CERVICAL SPINE BONES AND ALIGNMENT: No acute fracture or traumatic malalignment. DEGENERATIVE CHANGES: Mild degenerative changes of the mid cervical spine and most prominent at C4-C5 and C6-C7. SOFT TISSUES: No prevertebral soft tissue swelling. LIMITATIONS/ARTIFACTS: Motion degraded images. IMPRESSION: 1. No acute intracranial abnormality. 2. No acute traumatic injury to the cervical spine. Electronically signed by: Pinkie Pebbles MD 02/27/2024 10:06 PM EDT RP Workstation: [id number]   CT Cervical Spine Wo Contrast Result Date: 02/27/2024 EXAM: CT HEAD AND CERVICAL SPINE 02/27/2024 10:01:36 PM TECHNIQUE: CT of the head and cervical spine was performed without the administration of intravenous contrast. Multiplanar reformatted images are provided for review. Automated exposure control, iterative reconstruction, and/or weight based adjustment of the mA/kV was utilized to reduce the radiation dose to as low as reasonably achievable. COMPARISON: None available. CLINICAL HISTORY: Head trauma, minor (Age >= 63y). Pt LKW by neighbors was 2 days ago. Called for wellness check. Fire and ems broke down door at home, found pt face down covered in urine. Confused. Pulling at cords. Pt has hx of emphysema wears O2 2L at base. Pt currently confused and uncooperative. FINDINGS: CT HEAD BRAIN AND VENTRICLES: No acute intracranial hemorrhage. No mass effect or midline shift. No abnormal extra-axial fluid collection. No evidence of acute infarct. No hydrocephalus. Global cortical atrophy. Subcortical and periventricular small vessel ischemic changes. Intracranial atherosclerosis. ORBITS: No acute abnormality. SINUSES AND MASTOIDS: No acute abnormality. SOFT TISSUES AND SKULL: No acute skull fracture. No acute soft tissue abnormality.  CT CERVICAL SPINE BONES AND ALIGNMENT: No acute fracture or traumatic malalignment. DEGENERATIVE CHANGES: Mild degenerative  changes of the mid cervical spine and most prominent at C4-C5 and C6-C7. SOFT TISSUES: No prevertebral soft tissue swelling. LIMITATIONS/ARTIFACTS: Motion degraded images. IMPRESSION: 1. No acute intracranial abnormality. 2. No acute traumatic injury to the cervical spine. Electronically signed by: Pinkie Pebbles MD 02/27/2024 10:06 PM EDT RP Workstation: [id number]   DG Chest Portable 1 View Result Date: 02/27/2024 EXAM: 1 VIEW(S) XRAY OF THE CHEST 02/27/2024 09:32:00 PM COMPARISON: CT of the chest 12/17/2022. Chest x-ray 09/09/2017. CLINICAL HISTORY: Questionable sepsis - evaluate for abnormality. Pt LKW by neighbors was 2 days ago. Called for wellness check. Fire and ems broke down door at home, found pt face down covered in urine. Confused. Pulling at cords. Pt has hx of emphysema wears O2 2L at base. Pt currently confused and uncooperative. FINDINGS: LUNGS AND PLEURA: There are 2 small nodular densities in the right lung apex, indeterminate. No focal lung infiltrate. There is stable mild elevation of the left hemidiaphragm. No pulmonary edema. No pleural effusion. No pneumothorax. HEART AND MEDIASTINUM: No acute abnormality of the cardiac and mediastinal silhouettes. BONES AND SOFT TISSUES: No acute osseous abnormality. IMPRESSION: 1. Indeterminate 2 small nodular densities in the right lung apex. Follow up non-emergent chest CT recommended. 2. No focal lung infiltrate Electronically signed by: Greig Pique MD 02/27/2024 09:38 PM EDT RP Workstation: [id number]   MM 3D DIAGNOSTIC MAMMOGRAM UNILATERAL LEFT BREAST Result Date: 02/23/2024 CLINICAL DATA:  Recall from screening to evaluate a left breast mass. History of previous left breast cancer 2019. EXAM: DIGITAL DIAGNOSTIC UNILATERAL LEFT MAMMOGRAM WITH TOMOSYNTHESIS AND CAD; ULTRASOUND LEFT BREAST LIMITED TECHNIQUE: Left digital diagnostic mammography and breast tomosynthesis was performed. The images were evaluated with computer-aided detection. ;  Targeted ultrasound examination of the left breast was performed. COMPARISON:  Previous exam(s). ACR Breast Density Category b: There are scattered areas of fibroglandular density. FINDINGS: Additional views of the left breast demonstrate an oval circumscribed mass in the left retroareolar region which is increased in size even from patient's recent screening mammogram 02/14/2024. Targeted ultrasound is performed, showing an oval circumscribed solid and cystic mass over the 12 o'clock position of the left retroareolar region. Internal vascularity is present over the solid part of this mass. This mass measures 1 x 1.2 x 1.6 cm. Ultrasound of the left axilla is normal. IMPRESSION: 1.6 cm indeterminate solid and cystic mass over the 12 o'clock position of the left retroareolar region. RECOMMENDATION: Recommend ultrasound-guided core needle biopsy for further evaluation. I have discussed the findings and recommendations with the patient. If applicable, a reminder letter will be sent to the patient regarding the next appointment. BI-RADS CATEGORY  4: Suspicious. Biopsy scheduling will be facilitated by the ultrasound technologist prior to patient's departure. Electronically Signed   By: Toribio Agreste M.D.   On: 02/23/2024 15:26   US  LIMITED ULTRASOUND INCLUDING AXILLA LEFT BREAST  Result Date: 02/23/2024 CLINICAL DATA:  Recall from screening to evaluate a left breast mass. History of previous left breast cancer 2019. EXAM: DIGITAL DIAGNOSTIC UNILATERAL LEFT MAMMOGRAM WITH TOMOSYNTHESIS AND CAD; ULTRASOUND LEFT BREAST LIMITED TECHNIQUE: Left digital diagnostic mammography and breast tomosynthesis was performed. The images were evaluated with computer-aided detection. ; Targeted ultrasound examination of the left breast was performed. COMPARISON:  Previous exam(s). ACR Breast Density Category b: There are scattered areas of fibroglandular density. FINDINGS: Additional views of the left breast demonstrate an oval  circumscribed mass in the  left retroareolar region which is increased in size even from patient's recent screening mammogram 02/14/2024. Targeted ultrasound is performed, showing an oval circumscribed solid and cystic mass over the 12 o'clock position of the left retroareolar region. Internal vascularity is present over the solid part of this mass. This mass measures 1 x 1.2 x 1.6 cm. Ultrasound of the left axilla is normal. IMPRESSION: 1.6 cm indeterminate solid and cystic mass over the 12 o'clock position of the left retroareolar region. RECOMMENDATION: Recommend ultrasound-guided core needle biopsy for further evaluation. I have discussed the findings and recommendations with the patient. If applicable, a reminder letter will be sent to the patient regarding the next appointment. BI-RADS CATEGORY  4: Suspicious. Biopsy scheduling will be facilitated by the ultrasound technologist prior to patient's departure. Electronically Signed   By: Toribio Agreste M.D.   On: 02/23/2024 15:26   MM 3D SCREENING MAMMOGRAM BILATERAL BREAST Result Date: 02/16/2024 CLINICAL DATA:  Screening. EXAM: DIGITAL SCREENING BILATERAL MAMMOGRAM WITH TOMOSYNTHESIS AND CAD TECHNIQUE: Bilateral screening digital craniocaudal and mediolateral oblique mammograms were obtained. Bilateral screening digital breast tomosynthesis was performed. The images were evaluated with computer-aided detection. COMPARISON:  Previous exam(s). ACR Breast Density Category b: There are scattered areas of fibroglandular density. FINDINGS: In the left breast, a possible enlarging mass warrants further evaluation. In the right breast, no findings suspicious for malignancy. IMPRESSION: Further evaluation is suggested for a possible mass in the left breast. RECOMMENDATION: Diagnostic mammogram and possibly ultrasound of the left breast. (Code:[id number]) The patient will be contacted regarding the findings, and additional imaging will be scheduled. BI-RADS CATEGORY  0:  Incomplete: Need additional imaging evaluation. Electronically Signed   By: Curtistine Noble   On: 02/16/2024 16:19    I have personally spent 32 minutes involved in face-to-face and non-face-to-face activities for this patient on the day of the visit. Professional time spent includes the following activities: preparing to see the patient (review of tests), obtaining and reviewing separately obtained history (admission/discharge record), performing a medically appropriate examination, ordering medications, communicating with other health care professionals, documenting clinical information in the EMR and care coordination.    Greg Calone, NP [facility]  03/02/2024  12:17 PM

## 2024-03-02 NOTE — Progress Notes (Signed)
 NAME:  Victoria [name], MRN:  [id number], DOB:  12/24/1945, LOS: 3 ADMISSION DATE:  02/27/2024, CONSULTATION DATE:  02/28/24 REFERRING MD:  Haze, CHIEF COMPLAINT:  Acute respiratory failure    History of Present Illness:  Patient is an 78 yo F w/ pertinent PMH COPD on 2L [place] at home, OSA, HTN, HLD, T2DM, CKD 3a presents to [facility] on 10/14 w/ AMS found to have multiple embolic strokes and strep bacteremia, requiring invasive mechanical ventilation.    [name] LKN 10/10. Patient found on 10/13 on floor confused by EMS, [facility]. Patient arrived to ED meeting SIRS criteria, became lethargic with increased respiratory distress requiring intubation.    Patient remains with MIV, now off levophed, remains on precedex and fentanyl  drip.    Lines/Tubes: Intubated, NG tube, urethral catheter, PIV x3  Pertinent  Medical History   Past Medical History:  Diagnosis Date   Abdominal aneurysm    Dr. Harvey follows lLOV 2 ''17 per pt around 2 cm   Anemia    as a child   Arthritis    left ankle, right knee, right SI joint, wrists, lower back   Breast cancer in female [facility])    Left   COPD (chronic obstructive pulmonary disease) (HCC)    ephysema-Dr. 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 Eletha in [place]   Sleep apnea    cpap used sometimes, uses oxygen  concentrator 2 l/m nasally bedtime   Varicella as child     Significant Hospital Events: Including procedures, antibiotic start and stop dates in addition to other pertinent events   02/27/24: Admission to ICU requiring  MIV  Interim History / Subjective:  ON: Episode of desat to 86% with increased WOB.  CXR without acute chest process, stable cardiomegaly. Increase in vent setting.   Tmax 100.4, BP 140-150/50-60 with MAP 80-90, satting well on MVI (PEEP8, FiO2 50, TV 420)   I/O: 614 UOP in last 24 (increased) Up +11 L since admit   BMP: pending CK: pending Mg 2.6 Phos 2.9  Repeat blood cx 10/15: NG 2d CSF cx 10/15: NG 2d  Objective    Blood pressure (!) 146/57, pulse 61, temperature 98.8 F (37.1 C), resp. rate 16, height 5' 4.5 (1.638 m), weight 98.1 kg, SpO2 98%.    Vent Mode: PRVC FiO2 (%):  [40 %-50 %] 50 % Set Rate:  [20 bmp] 20 bmp Vt Set:  [420 mL-440 mL] 420 mL PEEP:  [5 cmH20-8 cmH20] 8 cmH20 Plateau Pressure:  [9 cmH20-21 cmH20] 12 cmH20   Intake/Output Summary (Last 24 hours) at 03/02/2024 0741 Last data filed at 03/02/2024 0600 Gross per 24 hour  Intake 5238.64 ml  Output 614 ml  Net 4624.64 ml   Filed Weights   02/28/24 0200 03/01/24 0500  Weight: 84.4 kg 98.1 kg    Examination: General: No acute distress.  CV: Normal S1/S2. No extra heart sounds. Normal distal pulses.  Pulm: Mechanically ventilated. CTAB.  No increased WOB. Equal chest rise and fall.  Abd: NG tube in place. Normoactive BS. Soft, mild distention.  Skin:  Warm, dry. Neuro: Sedated. Not responsive to pain. No coughing or gagging.    Resolved problem list   Assessment and Plan   #Acute metabolic encephalopathy #Strep bacteremia  TTE unremarkable, no source of embolism identified. Brain MRA normal. CSF non-infectious. S/p cefepime, vanc, flagyl. CBC stable, Mg 1.6 > 2.3, lipid unremarkable. Febrile to 100.4 overnight, CXR without acute chest process.  - Plan for TEE with Cardiology today - Cont CTX 2mg  Q12 - Repeat blood cultures 10/15: NG 2d - Monitor UOP  - Increase tube feeds as tolerated   #Rhabdomyolysis  CK 1638 > pending today.  -Cont LR 200 ml/hr    #AKI Cr 1.1 > pending today.   -CTM -Foley in place -Strict I/O   #DM BG stable. -Maintain BG 140-180 -Mod SSI   #PADs -Precedex 0.9 mcg/kg/hr -Fentanyl  33mcg/hr  #Stress ulcer ppx  -PPI   #VTE ppx -Heparin  PPX  Labs   CBC: Recent Labs  Lab 02/27/24 2130 02/27/24 2341 02/28/24 0603 02/29/24 0320 03/01/24 0444  WBC 16.3*  --  12.3* 12.9* 12.3*  NEUTROABS 13.9*  --   --   --   --   HGB 13.2 12.9 10.3* 11.1* 10.5*  HCT 42.3 38.0 32.6* 35.9* 33.8*  MCV 87.8  --  87.4 88.6 89.7  PLT 240  --  165 150 132*    Basic Metabolic Panel: Recent Labs  Lab 02/27/24 2130 02/27/24 2341 02/28/24 0603 02/29/24 0320 03/01/24 0444 03/01/24 1029 03/02/24 0444  NA 137 138  --  138 135  --   --   K 4.1 3.3*  --  3.4* 4.2  --   --   CL 100  --   --  103 102  --   --   CO2 24  --   --  24 24  --   --   GLUCOSE 228*  --   --  134* 121*  --   --   BUN 16  --   --  26* 30*  --   --   CREATININE 1.00  --  1.18* 1.37* 1.10*  --   --   CALCIUM  9.3  --   --  8.1* 7.8*  --   --   MG  --   --  1.5* 1.6* 2.3 2.4 2.6*  PHOS  --   --   --   --   --  3.5 2.9   GFR: Estimated Creatinine Clearance: 46.9 mL/min (A) (by C-G formula based on SCr of 1.1 mg/dL (H)). Recent Labs  Lab 02/27/24 2127 02/27/24 2130 02/27/24 2341 02/28/24 0603 02/29/24 0320 03/01/24 0444  WBC  --  16.3*  --  12.3* 12.9* 12.3*  LATICACIDVEN 4.1*  --  2.6*  --  1.2  --     Liver Function Tests: Recent Labs  Lab 02/27/24 2130  AST 62*  ALT 43  ALKPHOS 91  BILITOT 0.8  PROT 6.9  ALBUMIN 3.3*   No results for input(s): LIPASE, AMYLASE in the last 168 hours. Recent Labs  Lab 02/28/24 0603  AMMONIA 29    ABG    Component Value Date/Time   HCO3 27.5 02/27/2024 2341   TCO2 29 02/27/2024 2341   O2SAT 96 02/27/2024 2341     Coagulation Profile: Recent Labs  Lab 02/27/24 2130  INR 0.9    Cardiac Enzymes: Recent  Labs  Lab 02/27/24 2130 02/29/24 0320 03/01/24 0444  CKTOTAL 661* 2,418* 1,638*     HbA1C: Hemoglobin A1C  Date/Time Value Ref Range Status  01/25/2023 12:50 PM 6.1 (A) 4.0 - 5.6 % Final  07/19/2022 01:20 PM 7.2 (A) 4.0 - 5.6 % Final   Hgb A1c MFr Bld  Date/Time Value Ref Range Status  02/28/2024 06:00 AM 7.0 (H) 4.8 - 5.6 % Final    Comment:    (NOTE) Diagnosis of Diabetes The following HbA1c ranges recommended by the American Diabetes Association (ADA) may be used as an aid in the diagnosis of diabetes mellitus.  Hemoglobin             Suggested A1C NGSP%              Diagnosis  <5.7                   Non Diabetic  5.7-6.4                Pre-Diabetic  >6.4                   Diabetic  <7.0                   Glycemic control for                       adults with diabetes.    01/25/2021 12:08 PM 6.3 (H) 4.8 - 5.6 % Final    Comment:    (NOTE) Pre diabetes:          5.7%-6.4%  Diabetes:              >6.4%  Glycemic control for   <7.0% adults with diabetes     CBG: Recent Labs  Lab 03/01/24 1514 03/01/24 1919 03/01/24 2311 03/02/24 0309 03/02/24 0713  GLUCAP 162* 161* 158* 180* 133*    Review of Systems:   N/a  Past Medical History:  She,  has a past medical history of Abdominal aneurysm, Anemia, Arthritis, Breast cancer in female [facility]), COPD (chronic obstructive pulmonary disease) (HCC), Dyspnea, Headache, Heart murmur, Hyperlipidemia, Hypertension, Multiple thyroid  nodules, Murmur, cardiac (1950), Osteoporosis, Pneumonia, Pre-diabetes, Requires continuous at home supplemental oxygen , Sebaceous cyst, Sleep apnea, and Varicella (as child).   Surgical History:   Past Surgical History:  Procedure Laterality Date   APPENDECTOMY     2008   BREAST LUMPECTOMY Left 08/31/2017   BREAST LUMPECTOMY WITH RADIOACTIVE SEED LOCALIZATION Left 08/10/2017   Procedure: BREAST LUMPECTOMY WITH RADIOACTIVE SEED LOCALIZATION;  Surgeon: Ebbie Cough, MD;  Location: [facility] OR;  Service: General;  Laterality: Left;   CESAREAN SECTION     1972    COLONOSCOPY     COLONOSCOPY WITH PROPOFOL  N/A 04/16/2016   Procedure: COLONOSCOPY WITH PROPOFOL ;  Surgeon: Belvie Just, MD;  Location: [facility] ENDOSCOPY;  Service: Endoscopy;  Laterality: N/A;   CYST REMOVAL NECK Left 04/13/2016   Procedure: EXCISION OF SEBACEOUS CYST LEFT POSTERIOR NECK;  Surgeon: Krystal Spinner, MD;  Location: [facility] OR;  Service: General;  Laterality: Left;   Excision of Pelvic Absess, Right Ovary     2008   RE-EXCISION OF BREAST CANCER,SUPERIOR MARGINS Left 08/31/2017   Procedure: RE-EXCISION OF LEFT  BREAST MARGINS ERAS PATHWAY;  Surgeon: Ebbie Cough, MD;  Location: [facility] OR;  Service: General;  Laterality: Left;   TUBAL LIGATION  1980     Social History:   reports that she quit smoking  about 12 years ago. Her smoking use included cigarettes. She started smoking about 62 years ago. She has a 50 pack-year smoking history. She has never used smokeless tobacco. She reports current alcohol use. She reports that she does not use drugs.   Family History:  Her family history includes Alzheimer's disease in her brother and father; Breast cancer in her paternal grandmother; Diabetes in her brother; Heart disease in her mother; Hyperlipidemia in her brother and brother; Hypertension in her brother, brother, and mother; Stroke (age of onset: 71) in her father.   Allergies Allergies  Allergen Reactions   Benicar  [Olmesartan ] Swelling    Swelling of face and arms    [name]  [Valsartan ] Swelling    Swelling of face and arms    Hydrocodone -Acetaminophen  Nausea And Vomiting    Severe vomiting   Lisinopril  Cough   Monosodium Glutamate Other (See Comments)    Facial swelling per pt   Codeine Other (See Comments)    jittery   Lead Acetate Rash   Nickel Rash    Severe rash to infection: pt is allergic to all metals other than sterling silver or gold jewelry.    Penicillins Other (See Comments)    Unknown reaction per family at the bedside     Home Medications  Prior to Admission  medications   Medication Sig Start Date End Date Taking? Authorizing Provider  Accu-Chek Softclix Lancets lancets Use as instructed to check blood sugar 1x daily 03/30/23   Shamleffer, Ibtehal Jaralla, MD  acetaminophen  (TYLENOL ) 500 MG tablet Take 1,000 mg by mouth every 6 (six) hours as needed.    [provider]  albuterol  (PROVENTIL ) (2.5 MG/3ML) 0.083% nebulizer solution Take 3 mLs (2.5 mg total) by nebulization every 6 (six) hours as needed for wheezing or shortness of breath. 12/31/21   Parrett, Madelin RAMAN, NP  Albuterol  Sulfate (PROAIR  RESPICLICK) 108 (90 Base) MCG/ACT AEPB Inhale 1-2 puffs into the lungs every 6 (six) hours as needed (for wheezing/shortness of breath). 03/31/20   Geronimo Amel, MD  amLODipine  (NORVASC ) 10 MG tablet TAKE ONE TABLET BY MOUTH ONCE DAILY 11/24/22   Burnard Debby LABOR, MD  aspirin  EC 81 MG tablet Take 1 tablet (81 mg total) by mouth daily. 07/23/19   Kroeger, Krista M., PA-C  Blood Glucose Monitoring Suppl (ACCU-CHEK AVIVA PLUS) w/Device KIT Use to monitor glucose levels once per day; E11.9 06/10/23   Shamleffer, Donell Cardinal, MD  carvedilol  (COREG ) 6.25 MG tablet TAKE ONE TABLET BY MOUTH EVERY MORNING and TAKE ONE TABLET BY MOUTH EVERYDAY AT BEDTIME 11/24/22   Burnard Debby LABOR, MD  cetirizine  (ZYRTEC ) 10 MG tablet Take 10 mg by mouth every morning. 12/28/23   [provider]  Cholecalciferol (VITAMIN D ) 50 MCG (2000 UT) tablet Take 2,000 Units by mouth daily. 02/07/24   [provider]  Cholecalciferol (VITAMIN D3) 125 MCG (5000 UT) CAPS TAKE ONE CAPSULE BY MOUTH ONCE DAILY 12/29/22   Joshua Debby CROME, MD  dapagliflozin  propanediol (FARXIGA ) 10 MG TABS tablet Take 1 tablet (10 mg total) by mouth daily. 09/20/23   Shamleffer, Ibtehal Jaralla, MD  denosumab  (PROLIA ) 60 MG/ML SOSY injection Inject 60 mg into the skin every 6 (six) months. 06/16/22   Joshua Debby CROME, MD  FEROSUL 325 (65 Fe) MG tablet Take 325 mg by mouth 3 (three) times a week. 08/09/23    [provider]  Fluticasone -Umeclidin-Vilant (TRELEGY ELLIPTA ) 100-62.5-25 MCG/ACT AEPB INHALE ONE (1) PUFF BY MOUTH AND INTO THE LUNGS DAILY 12/12/23  Geronimo Amel, MD  glucose blood (ACCU-CHEK AVIVA PLUS) test strip USE TO Check blood glucose 1x DAILY 06/03/23   Shamleffer, Ibtehal Jaralla, MD  glucose blood (ACCU-CHEK GUIDE) test strip USE 1 STRIP ONCE DAILY 04/09/20   Kassie Mallick, MD  glucose blood test strip USE 1 STRIP ONCE DAILY 02/29/20   Kassie Mallick, MD  methocarbamol  (ROBAXIN ) 500 MG tablet Take 1 tablet (500 mg total) by mouth every 8 (eight) hours as needed for muscle spasms. 08/19/22   Elnor Lauraine BRAVO, NP  OXYGEN  Inhale 2-3 L into the lungs continuous. When exerting self    [provider]  Potassium Chloride  ER 20 MEQ TBCR Take 1 tablet by mouth every morning. 01/07/23   [provider]  roflumilast  (DALIRESP ) 500 MCG TABS tablet TAKE ONE TABLET BY MOUTH EVERY MORNING 11/25/22   Geronimo Amel, MD  rosuvastatin  (CRESTOR ) 40 MG tablet TAKE ONE TABLET BY MOUTH EVERYDAY AT BEDTIME 11/24/22   Burnard Debby LABOR, MD  SitaGLIPtin -MetFORMIN  HCl (JANUMET  XR) 50-1000 MG TB24 Take 1 tablet by mouth daily before supper. 09/20/23   Shamleffer, Ibtehal Jaralla, MD  spironolactone  (ALDACTONE ) 25 MG tablet TAKE ONE TABLET BY MOUTH ONCE DAILY 11/24/22   Burnard Debby LABOR, MD  thiamine  (VITAMIN B1) 100 MG tablet TAKE 1 TABLET BY MOUTH EVERY OTHER DAY 01/20/23   Joshua Debby CROME, MD

## 2024-03-02 NOTE — Procedures (Signed)
   TRANSESOPHAGEAL ECHOCARDIOGRAM  NAME:  Victoria [name]    MRN: [id number] DOB:  26-May-1945    ADMIT DATE: 02/27/2024  INDICATIONS: Endocarditis rule out  PROCEDURE:   Informed consent was obtained prior to the procedure. The risks, benefits and alternatives for the procedure were discussed and the patient comprehended these risks.  Risks include, but are not limited to, cough, sore throat, vomiting, nausea, somnolence, esophageal and stomach trauma or perforation, bleeding, low blood pressure, aspiration, pneumonia, infection, trauma to the teeth and death.    After a procedural timeout, the patient was administered fentanyl  and versed  boluses. Patient already on fentanyl  drip with precedex.  The patient's heart rate, blood pressure, and oxygen  saturation were monitored continuously during the procedure. The period of conscious sedation was 20 minutes, of which I was present face-to-face 100% of this time.  The transesophageal probe was inserted in the esophagus and stomach after removal of the OG tube. Short vagal episode during insertion. Multiple views were obtained.  The patient was kept under observation until the patient left the procedure room.  The patient left the procedure room in stable condition.    COMPLICATIONS:    Complications: No complications.  The patient had normal neuro status and respiratory status post procedure with vitals stable as recorded elsewhere.  Adequate airway was maintained throughout and vital signs monitored per protocol.  KEY FINDINGS:  Normal LV and RV function No evidence of endocarditis Full Report to follow.   Morene Brownie Advanced Heart Failure 1:58 PM

## 2024-03-03 ENCOUNTER — Inpatient Hospital Stay (HOSPITAL_COMMUNITY)

## 2024-03-03 DIAGNOSIS — E785 Hyperlipidemia, unspecified: Secondary | ICD-10-CM

## 2024-03-03 DIAGNOSIS — B950 Streptococcus, group A, as the cause of diseases classified elsewhere: Secondary | ICD-10-CM

## 2024-03-03 DIAGNOSIS — R7881 Bacteremia: Secondary | ICD-10-CM | POA: Diagnosis not present

## 2024-03-03 DIAGNOSIS — B955 Unspecified streptococcus as the cause of diseases classified elsewhere: Secondary | ICD-10-CM | POA: Diagnosis not present

## 2024-03-03 DIAGNOSIS — M7989 Other specified soft tissue disorders: Secondary | ICD-10-CM | POA: Diagnosis not present

## 2024-03-03 DIAGNOSIS — G928 Other toxic encephalopathy: Secondary | ICD-10-CM

## 2024-03-03 DIAGNOSIS — I330 Acute and subacute infective endocarditis: Secondary | ICD-10-CM | POA: Diagnosis not present

## 2024-03-03 DIAGNOSIS — I6340 Cerebral infarction due to embolism of unspecified cerebral artery: Secondary | ICD-10-CM | POA: Diagnosis not present

## 2024-03-03 DIAGNOSIS — J9601 Acute respiratory failure with hypoxia: Secondary | ICD-10-CM

## 2024-03-03 LAB — GLUCOSE, CAPILLARY
Glucose-Capillary: 151 mg/dL — ABNORMAL HIGH (ref 70–99)
Glucose-Capillary: 156 mg/dL — ABNORMAL HIGH (ref 70–99)
Glucose-Capillary: 144 mg/dL — ABNORMAL HIGH (ref 70–99)
Glucose-Capillary: 154 mg/dL — ABNORMAL HIGH (ref 70–99)
Glucose-Capillary: 156 mg/dL — ABNORMAL HIGH (ref 70–99)
Glucose-Capillary: 167 mg/dL — ABNORMAL HIGH (ref 70–99)

## 2024-03-03 LAB — BASIC METABOLIC PANEL WITH GFR
Anion gap: 12 (ref 5–15)
BUN: 24 mg/dL — ABNORMAL HIGH (ref 8–23)
CO2: 25 mmol/L (ref 22–32)
Calcium: 8.2 mg/dL — ABNORMAL LOW (ref 8.9–10.3)
Chloride: 102 mmol/L (ref 98–111)
Creatinine, Ser: 0.88 mg/dL (ref 0.44–1.00)
GFR, Estimated: >60 mL/min (ref >60)
Glucose, Bld: 166 mg/dL — ABNORMAL HIGH (ref 70–99)
Potassium: 4.2 mmol/L (ref 3.5–5.1)
Sodium: 139 mmol/L (ref 135–145)

## 2024-03-03 LAB — CBC
HCT: 31.8 % — ABNORMAL LOW (ref 36.0–46.0)
Hemoglobin: 9.7 g/dL — ABNORMAL LOW (ref 12.0–15.0)
MCH: 27.6 pg (ref 26.0–34.0)
MCHC: 30.5 g/dL (ref 30.0–36.0)
MCV: 90.3 fL (ref 80.0–100.0)
Platelets: 126 K/uL — ABNORMAL LOW (ref 150–400)
RBC: 3.52 MIL/uL — ABNORMAL LOW (ref 3.87–5.11)
RDW: 15.5 % (ref 11.5–15.5)
WBC: 9 K/uL (ref 4.0–10.5)
nRBC: 0 % (ref 0.0–0.2)

## 2024-03-03 LAB — PHOSPHORUS: Phosphorus: 2.5 mg/dL (ref 2.5–4.6)

## 2024-03-03 LAB — CK: Total CK: 226 U/L (ref 38–234)

## 2024-03-03 LAB — CSF CULTURE W GRAM STAIN: Culture: NO GROWTH

## 2024-03-03 LAB — TRIGLYCERIDES: Triglycerides: 145 mg/dL (ref <150)

## 2024-03-03 LAB — MAGNESIUM: Magnesium: 2.3 mg/dL (ref 1.7–2.4)

## 2024-03-03 MED ORDER — POLYETHYLENE GLYCOL 3350 17 G PO PACK
17.0000 g | PACK | Freq: Two times a day (BID) | ORAL | Status: DC
  Administered 2024-03-03 (×2): 17 g
  Filled 2024-03-03: qty 1

## 2024-03-03 MED ORDER — THIAMINE MONONITRATE 100 MG PO TABS
100.0000 mg | ORAL_TABLET | Freq: Every day | ORAL | Status: DC
  Administered 2024-03-03 – 2024-03-11 (×9): 100 mg
  Filled 2024-03-03 (×9): qty 1

## 2024-03-03 MED ORDER — OXYCODONE HCL 5 MG PO TABS
10.0000 mg | ORAL_TABLET | Freq: Four times a day (QID) | ORAL | Status: DC
  Administered 2024-03-03 – 2024-03-11 (×32): 10 mg
  Filled 2024-03-03 (×32): qty 2

## 2024-03-03 MED ORDER — SENNOSIDES-DOCUSATE SODIUM 8.6-50 MG PO TABS: 1.0000 | ORAL_TABLET | Freq: Every day | ORAL | Status: DC

## 2024-03-03 MED ORDER — HYDRALAZINE HCL 20 MG/ML IJ SOLN
5.0000 mg | INTRAMUSCULAR | Status: AC | PRN
  Administered 2024-03-04 – 2024-03-05 (×2): 5 mg via INTRAVENOUS
  Administered 2024-03-06 (×3): 10 mg via INTRAVENOUS
  Administered 2024-03-07: 5 mg via INTRAVENOUS
  Administered 2024-03-07: 10 mg via INTRAVENOUS
  Administered 2024-03-07: 5 mg via INTRAVENOUS
  Administered 2024-03-07 – 2024-03-08 (×2): 10 mg via INTRAVENOUS
  Filled 2024-03-03 (×9): qty 1

## 2024-03-03 MED ORDER — SENNOSIDES-DOCUSATE SODIUM 8.6-50 MG PO TABS
1.0000 | ORAL_TABLET | Freq: Every day | ORAL | Status: DC
  Administered 2024-03-03: 1
  Filled 2024-03-03: qty 1

## 2024-03-03 MED ORDER — FUROSEMIDE 10 MG/ML IJ SOLN
80.0000 mg | Freq: Two times a day (BID) | INTRAMUSCULAR | Status: AC
  Administered 2024-03-03 (×2): 80 mg via INTRAVENOUS
  Filled 2024-03-03 (×2): qty 8

## 2024-03-03 MED ORDER — ACETAMINOPHEN 160 MG/5ML PO SOLN
650.0000 mg | Freq: Four times a day (QID) | ORAL | Status: DC | PRN
  Administered 2024-03-03 – 2024-03-04 (×3): 650 mg via ORAL
  Filled 2024-03-03 (×3): qty 20.3

## 2024-03-03 NOTE — Progress Notes (Signed)
 VASCULAR LAB    Left upper extremity venous duplex has been performed.  See CV proc for preliminary results.   Sherilee Smotherman, RVT 03/03/2024, 5:45 PM

## 2024-03-03 NOTE — Progress Notes (Signed)
 NAME:  Victoria [name], MRN:  [id number], DOB:  [birth year], LOS: 4 ADMISSION DATE:  02/27/2024, CONSULTATION DATE:  02/28/24 REFERRING MD:  Haze, CHIEF COMPLAINT:  Acute respiratory failure   History of Present Illness:  Patient is an 78 yo F w/ pertinent PMH COPD on 2L [place] at home, OSA, HTN, HLD, T2DM, CKD 3a presents to [facility] on 10/14 w/ AMS found to have multiple embolic strokes and strep bacteremia, requiring invasive mechanical ventilation.    [name] LKN 10/10. Patient found on 10/13 on floor confused by EMS, brought to [facility] ED. Patient arrived to ED meeting SIRS criteria, became lethargic with increased respiratory distress requiring intubation.     Patient remains intubated, now off levophed and precedex, on fentanyl  and propofol  drip. Though no definitive embolic source identified on TEE, plan for prolonged IV abx for endocarditis due to high clinical suspicion.    Lines/Tubes: Intubated, Cortrak, urethral catheter, PIV x2  Pertinent  Medical History   Past Medical History:  Diagnosis Date   Abdominal aneurysm    Dr. Harvey follows lLOV 2 ''17 per pt around 2 cm   Anemia    as a child   Arthritis    left ankle, right knee, right SI joint, wrists, lower back   Breast cancer in female [facility])    Left   COPD (chronic obstructive pulmonary disease) (HCC)    ephysema-Dr. 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 Eletha in OR [place]   Sleep apnea    cpap used sometimes, uses oxygen  concentrator 2 l/m nasally bedtime   Varicella as child     Significant Hospital  Events: Including procedures, antibiotic start and stop dates in addition to other pertinent events   10/14: Admitted to ICU requiring MIV   Interim History / Subjective:  Overnight, bradycardia into 30s transiently so precedex and prn labetalol were stopped, prn hydral substituted.  Afebrile, BP 120-140/40-50, MAP 60-70, satting well on MIV (420 TD, FiO2 40, PEEP 5, 20 RR)  I/O: UP +12L since admission; 450 UOP in last 24  Objective    Blood pressure (!) 114/42, pulse 64, temperature 99.5 F (37.5 C), resp. rate 18, height 5' 4.5 (1.638 m), weight 103 kg, SpO2 96%.    Vent Mode: PRVC FiO2 (%):  [40 %] 40 % Set Rate:  [20 bmp] 20 bmp Vt Set:  [420 mL] 420 mL PEEP:  [5 cmH20-8 cmH20] 5 cmH20 Pressure Support:  [5 cmH20] 5 cmH20 Plateau Pressure:  [15 cmH20-18 cmH20] 15 cmH20   Intake/Output Summary (Last 24 hours) at 03/03/2024 0736 Last data filed at 03/03/2024 0700 Gross per 24 hour  Intake 1586.96 ml  Output 450 ml  Net 1136.96 ml   Filed Weights   02/28/24 0200 03/01/24 0500 03/03/24 0500  Weight: 84.4 kg 98.1 kg 103 kg    Examination: General: No acute distress.  CV: Normal S1/S2. No extra heart sounds. Warm and well-perfused. Pulm: Intubated. Crackles bilaterally. No increased WOB.  Equal chest rise and fall.  Abd: Normoactive BS. Cortrack in place. Soft, mildly distended. Skin/Ext:  Warm, dry. Normal distal pulses.  Neuro: Spontaneously opens eyes, PEERL. Follows command of hand grip on R. Appears to have intermittent gag reflex to tubing.    Resolved problem list   Assessment and Plan   #Acute metabolic encephalopathy #Strep bacteremia  TTE and TEE without source of embolism identified. Brain MRA normal. CSF non-infectious. S/p broad spectrum abx, now on CTX. Plan for treatment of infective endocarditis per ID.  Afebrile overnight.  - Cont CTX 2mg  Q12 - Repeat blood cultures 10/15: NG 2d - Monitor UOP  - Increase tube feeds as tolerated  #Fluid  overload S/p 40IV lasix  yesterday, still volume overloaded today with +12 L since admission.  - Lasix  80 BID x2 doses today - Monitor kidney fx   #Rhabdomyolysis Resolving. CK 389 > 226 today.    #AKI Resolving. Cr 0.88 today.  -Foley in place -Strict I/O   #DM BG stable. -Maintain BG 140-180 -Mod SSI   #PADs -Fentanyl  150 mcg/hr -Propofol  35 mcg/kg/hr   #Stress ulcer ppx  -PPI    #VTE ppx -Heparin  PPX   Labs   CBC: Recent Labs  Lab 02/27/24 2130 02/27/24 2341 02/28/24 0603 02/29/24 0320 03/01/24 0444 03/02/24 0908 03/03/24 0217  WBC 16.3*  --  12.3* 12.9* 12.3* 10.6* 9.0  NEUTROABS 13.9*  --   --   --   --  9.0*  --   HGB 13.2   < > 10.3* 11.1* 10.5* 9.9* 9.7*  HCT 42.3   < > 32.6* 35.9* 33.8* 32.2* 31.8*  MCV 87.8  --  87.4 88.6 89.7 89.4 90.3  PLT 240  --  165 150 132* 165 126*   < > = values in this interval not displayed.    Basic Metabolic Panel: Recent Labs  Lab 02/29/24 0320 03/01/24 0444 03/01/24 1029 03/02/24 0444 03/02/24 0613 03/02/24 0908 03/02/24 1617 03/03/24 0217  NA 138 135  --   --  137 138 137 139  K 3.4* 4.2  --   --  4.2 4.4 4.3 4.2  CL 103 102  --   --  101 102 103 102  CO2 24 24  --   --  27 25 25 25   GLUCOSE 134* 121*  --   --  178* 178* 189* 166*  BUN 26* 30*  --   --  27* 27* 27* 24*  CREATININE 1.37* 1.10*  --   --  0.99 0.91 0.89 0.88  CALCIUM  8.1* 7.8*  --   --  8.2* 8.3* 8.4* 8.2*  MG 1.6* 2.3 2.4 2.6*  --   --   --  2.3  PHOS  --   --  3.5 2.9  --   --   --  2.5   GFR: Estimated Creatinine Clearance: 60.1 mL/min (by C-G formula based on SCr of 0.88 mg/dL). Recent Labs  Lab 02/27/24 2127 02/27/24 2130 02/27/24 2341 02/28/24 0603 02/29/24 0320 03/01/24 0444 03/02/24 0908 03/03/24 0217  WBC  --    < >  --    < > 12.9* 12.3* 10.6* 9.0  LATICACIDVEN 4.1*  --  2.6*  --  1.2  --   --   --    < > = values in this interval not displayed.    Liver Function Tests: Recent Labs  Lab 02/27/24 2130  03/02/24 0908  AST 62* 23  ALT 43 16  ALKPHOS 91  93  BILITOT 0.8 0.3  PROT 6.9 5.8*  ALBUMIN 3.3* 2.1*   No results for input(s): LIPASE, AMYLASE in the last 168 hours. Recent Labs  Lab 02/28/24 0603  AMMONIA 29    ABG    Component Value Date/Time   HCO3 27.5 02/27/2024 2341   TCO2 29 02/27/2024 2341   O2SAT 96 02/27/2024 2341     Coagulation Profile: Recent Labs  Lab 02/27/24 2130  INR 0.9    Cardiac Enzymes: Recent Labs  Lab 02/29/24 0320 03/01/24 0444 03/02/24 0613 03/02/24 0908 03/03/24 0217  CKTOTAL 2,418* 1,638* 380* 389* 226    HbA1C: Hemoglobin A1C  Date/Time Value Ref Range Status  01/25/2023 12:50 PM 6.1 (A) 4.0 - 5.6 % Final  07/19/2022 01:20 PM 7.2 (A) 4.0 - 5.6 % Final   Hgb A1c MFr Bld  Date/Time Value Ref Range Status  02/28/2024 06:00 AM 7.0 (H) 4.8 - 5.6 % Final    Comment:    (NOTE) Diagnosis of Diabetes The following HbA1c ranges recommended by the American Diabetes Association (ADA) may be used as an aid in the diagnosis of diabetes mellitus.  Hemoglobin             Suggested A1C NGSP%              Diagnosis  <5.7                   Non Diabetic  5.7-6.4                Pre-Diabetic  >6.4                   Diabetic  <7.0                   Glycemic control for                       adults with diabetes.    01/25/2021 12:08 PM 6.3 (H) 4.8 - 5.6 % Final    Comment:    (NOTE) Pre diabetes:          5.7%-6.4%  Diabetes:              >6.4%  Glycemic control for   <7.0% adults with diabetes     CBG: Recent Labs  Lab 03/02/24 1117 03/02/24 1519 03/02/24 1931 03/02/24 2313 03/03/24 0358  GLUCAP 138* 157* 134* 141* 151*    Past Medical History:  She,  has a past medical history of Abdominal aneurysm, Anemia, Arthritis, Breast cancer in female [facility]), COPD (chronic obstructive pulmonary disease) (HCC), Dyspnea, Headache, Heart murmur, Hyperlipidemia, Hypertension, Multiple thyroid  nodules, Murmur, cardiac (1950),  Osteoporosis, Pneumonia, Pre-diabetes, Requires continuous at home supplemental oxygen , Sebaceous cyst, Sleep apnea, and Varicella (as child).   Surgical History:   Past Surgical History:  Procedure Laterality Date   APPENDECTOMY     2008   BREAST LUMPECTOMY Left 08/31/2017   BREAST LUMPECTOMY WITH RADIOACTIVE SEED LOCALIZATION Left 08/10/2017   Procedure: BREAST LUMPECTOMY WITH RADIOACTIVE SEED LOCALIZATION;  Surgeon: Ebbie Cough, MD;  Location: [facility] OR;  Service: General;  Laterality: Left;   CESAREAN SECTION     1972   COLONOSCOPY     COLONOSCOPY WITH PROPOFOL  N/A 04/16/2016   Procedure: COLONOSCOPY WITH PROPOFOL ;  Surgeon: Belvie Just, MD;  Location: [facility]COPY;  Service: Endoscopy;  Laterality: N/A;   CYST REMOVAL NECK Left 04/13/2016   Procedure: EXCISION OF SEBACEOUS CYST LEFT POSTERIOR NECK;  Surgeon: Krystal Spinner, MD;  Location: [facility] OR;  Service: General;  Laterality: Left;   Excision of Pelvic Absess, Right Ovary     2008   RE-EXCISION OF BREAST CANCER,SUPERIOR MARGINS Left 08/31/2017   Procedure: RE-EXCISION OF LEFT  BREAST MARGINS ERAS PATHWAY;  Surgeon: Ebbie Cough, MD;  Location: [facility] OR;  Service: General;  Laterality: Left;   TUBAL LIGATION  1980     Social History:   reports that she quit smoking about 12 years ago. Her smoking use included cigarettes. She started smoking about 62 years ago. She has a 50 pack-year smoking history. She has never used smokeless tobacco. She reports current alcohol use. She reports that she does not use drugs.   Family History:  Her family history includes Alzheimer's disease in her brother and father; Breast cancer in her paternal grandmother; Diabetes in her brother; Heart disease in her mother; Hyperlipidemia in her brother and brother; Hypertension in her brother, brother, and mother; Stroke (age of onset: 31) in her father.   Allergies Allergies  Allergen Reactions   Benicar  [Olmesartan ] Swelling    Swelling of face and  arms    [name]  [Valsartan ] Swelling    Swelling of face and arms    Hydrocodone -Acetaminophen  Nausea And Vomiting    Severe vomiting   Lisinopril  Cough   Monosodium Glutamate Other (See Comments)    Facial swelling per pt   Codeine Other (See Comments)    jittery   Lead Acetate Rash   Nickel Rash    Severe rash to infection: pt is allergic to all metals other than sterling silver or gold jewelry.    Penicillins Other (See Comments)    Unknown reaction per family at the bedside     Home Medications  Prior to Admission medications   Medication Sig Start Date End Date Taking? Authorizing Provider  Accu-Chek Softclix Lancets lancets Use as instructed to check blood sugar 1x daily 03/30/23   Shamleffer, Ibtehal Jaralla, MD  acetaminophen  (TYLENOL ) 500 MG tablet Take 1,000 mg by mouth every 6 (six) hours as needed.    [provider]  albuterol  (PROVENTIL ) (2.5 MG/3ML) 0.083% nebulizer solution Take 3 mLs (2.5 mg total) by nebulization every 6 (six) hours as needed for wheezing or shortness of breath. 12/31/21   Parrett, Madelin RAMAN, NP  Albuterol  Sulfate (PROAIR  RESPICLICK) 108 (90 Base) MCG/ACT AEPB Inhale 1-2 puffs into the lungs every 6 (six) hours as needed (for wheezing/shortness of breath). 03/31/20   Geronimo Amel, MD  amLODipine  (NORVASC ) 10 MG tablet TAKE ONE TABLET BY MOUTH ONCE DAILY 11/24/22   Burnard Debby LABOR, MD  aspirin  EC 81 MG tablet Take 1 tablet (81 mg total) by mouth daily. 07/23/19   Kroeger, Krista M., PA-C  Blood Glucose Monitoring Suppl (ACCU-CHEK AVIVA PLUS) w/Device KIT Use to monitor glucose levels once per day; E11.9 06/10/23   Shamleffer, Donell Cardinal, MD  carvedilol  (COREG ) 6.25 MG tablet TAKE ONE TABLET BY MOUTH EVERY MORNING and TAKE ONE TABLET BY MOUTH EVERYDAY AT BEDTIME 11/24/22   Burnard Debby LABOR, MD  cetirizine  (ZYRTEC ) 10 MG tablet Take 10 mg by mouth every morning. 12/28/23   [provider]  Cholecalciferol (VITAMIN D ) 50 MCG (2000 UT)  tablet Take 2,000 Units by mouth daily. 02/07/24   [provider]  Cholecalciferol (VITAMIN D3) 125 MCG (5000 UT) CAPS TAKE ONE CAPSULE BY MOUTH ONCE DAILY 12/29/22   Joshua Debby CROME, MD  dapagliflozin  propanediol (FARXIGA ) 10 MG TABS tablet Take 1  tablet (10 mg total) by mouth daily. 09/20/23   Shamleffer, Ibtehal Jaralla, MD  denosumab  (PROLIA ) 60 MG/ML SOSY injection Inject 60 mg into the skin every 6 (six) months. 06/16/22   Joshua Debby CROME, MD  FEROSUL 325 (65 Fe) MG tablet Take 325 mg by mouth 3 (three) times a week. 08/09/23   [provider]  Fluticasone -Umeclidin-Vilant (TRELEGY ELLIPTA ) 100-62.5-25 MCG/ACT AEPB INHALE ONE (1) PUFF BY MOUTH AND INTO THE LUNGS DAILY 12/12/23   Geronimo Amel, MD  glucose blood (ACCU-CHEK AVIVA PLUS) test strip USE TO Check blood glucose 1x DAILY 06/03/23   Shamleffer, Ibtehal Jaralla, MD  glucose blood (ACCU-CHEK GUIDE) test strip USE 1 STRIP ONCE DAILY 04/09/20   Kassie Mallick, MD  glucose blood test strip USE 1 STRIP ONCE DAILY 02/29/20   Kassie Mallick, MD  methocarbamol  (ROBAXIN ) 500 MG tablet Take 1 tablet (500 mg total) by mouth every 8 (eight) hours as needed for muscle spasms. 08/19/22   Elnor Lauraine BRAVO, NP  OXYGEN  Inhale 2-3 L into the lungs continuous. When exerting self    [provider]  Potassium Chloride  ER 20 MEQ TBCR Take 1 tablet by mouth every morning. 01/07/23   [provider]  roflumilast  (DALIRESP ) 500 MCG TABS tablet TAKE ONE TABLET BY MOUTH EVERY MORNING 11/25/22   Geronimo Amel, MD  rosuvastatin  (CRESTOR ) 40 MG tablet TAKE ONE TABLET BY MOUTH EVERYDAY AT BEDTIME 11/24/22   Burnard Debby LABOR, MD  SitaGLIPtin -MetFORMIN  HCl (JANUMET  XR) 50-1000 MG TB24 Take 1 tablet by mouth daily before supper. 09/20/23   Shamleffer, Ibtehal Jaralla, MD  spironolactone  (ALDACTONE ) 25 MG tablet TAKE ONE TABLET BY MOUTH ONCE DAILY 11/24/22   Burnard Debby LABOR, MD  thiamine  (VITAMIN B1) 100 MG tablet TAKE 1 TABLET BY MOUTH EVERY OTHER  DAY 01/20/23   Joshua Debby CROME, MD

## 2024-03-03 NOTE — Plan of Care (Signed)
  Problem: Nutritional: Goal: Maintenance of adequate nutrition will improve Outcome: Progressing   Problem: Nutrition: Goal: Adequate nutrition will be maintained Outcome: Progressing   Problem: Coping: Goal: Level of anxiety will decrease Outcome: Progressing   Problem: Elimination: Goal: Will not experience complications related to urinary retention Outcome: Progressing   Problem: Pain Managment: Goal: General experience of comfort will improve and/or be controlled Outcome: Progressing

## 2024-03-03 NOTE — Progress Notes (Signed)
 STROKE TEAM PROGRESS NOTE    SIGNIFICANT HOSPITAL EVENTS 10/14: Patient admitted with fever and tachycardia, intubated for airway protection 10/15: Patient found to have right MCA territory stroke and other small strokes, embolic appearing on MRI  INTERIM HISTORY/SUBJECTIVE Remains intubated but now off levophed  Bradycardic overnight, precedex stopped   OBJECTIVE   Basic Metabolic Panel: Recent Labs  Lab 02/29/24 0320 03/01/24 0444 03/01/24 1029 03/02/24 0444 03/02/24 0613 03/02/24 0908 03/02/24 1617 03/03/24 0217  NA 138 135  --   --  137 138 137 139  K 3.4* 4.2  --   --  4.2 4.4 4.3 4.2  CL 103 102  --   --  101 102 103 102  CO2 24 24  --   --  27 25 25 25   GLUCOSE 134* 121*  --   --  178* 178* 189* 166*  BUN 26* 30*  --   --  27* 27* 27* 24*  CREATININE 1.37* 1.10*  --   --  0.99 0.91 0.89 0.88  CALCIUM  8.1* 7.8*  --   --  8.2* 8.3* 8.4* 8.2*  MG 1.6* 2.3 2.4 2.6*  --   --   --  2.3  PHOS  --   --  3.5 2.9  --   --   --  2.5    CBC: Recent Labs  Lab 02/27/24 2130 02/27/24 2341 02/28/24 0603 02/29/24 0320 03/01/24 0444 03/02/24 0908 03/03/24 0217  WBC 16.3*  --  12.3* 12.9* 12.3* 10.6* 9.0  NEUTROABS 13.9*  --   --   --   --  9.0*  --   HGB 13.2   < > 10.3* 11.1* 10.5* 9.9* 9.7*  HCT 42.3   < > 32.6* 35.9* 33.8* 32.2* 31.8*  MCV 87.8  --  87.4 88.6 89.7 89.4 90.3  PLT 240  --  165 150 132* 165 126*   < > = values in this interval not displayed.    Lab Results  Component Value Date   ALT 16 03/02/2024   AST 23 03/02/2024   ALKPHOS 93 03/02/2024   BILITOT 0.3 03/02/2024   Lab Results  Component Value Date   CKTOTAL 226 03/03/2024      IMAGING past 24 hours DG CHEST PORT 1 VIEW Result Date: 03/02/2024 CLINICAL DATA:  200808 Hypoxia [id number] EXAM: PORTABLE CHEST 1 VIEW COMPARISON:  03/02/2024. FINDINGS: Low lung volume. Bilateral lung fields are clear. Bilateral costophrenic angles are clear. Stable cardio-mediastinal silhouette. No acute osseous  abnormalities. The soft tissues are within normal limits. An enteric tube extends below diaphragm, tip out of the view of the radiograph. Endotracheal tube is not well seen.  Correlate with prior exam. IMPRESSION: No active disease. Electronically Signed   By: Ree Molt M.D.   On: 03/02/2024 14:32   ECHO TEE Result Date: 03/02/2024    TRANSESOPHOGEAL ECHO REPORT   Patient Name:   ANGELIN CUTRONE Date of Exam: 03/02/2024 Medical Rec #:  [id number]            Height:       64.5 in Accession #:    [id number]           Weight:       216.3 lb Date of Birth:  05/21/1945            BSA:          2.033 m Patient Age:    78 years  BP:           146/117 mmHg Patient Gender: F                    HR:           50 bpm. Exam Location:  Inpatient Procedure: Transesophageal Echo, 3D Echo and Color Doppler (Both Spectral and            Color Flow Doppler were utilized during procedure). Indications:     Endocarditis  History:         Patient has prior history of Echocardiogram examinations, most                  recent 02/29/2024. COPD; Risk Factors:Hypertension,                  Dyslipidemia, Sleep Apnea and Diabetes.  Sonographer:     Philomena Daring Referring Phys:  [id number] BENJAMIN J STONER Diagnosing Phys: Morene Brownie PROCEDURE: After discussion of the risks and benefits of a TEE, an informed consent was obtained from a family member. The transesophogeal probe was passed without difficulty through the esophogus of the patient. Imaged were obtained with the patient in a left lateral decubitus position. Sedation performed by performing physician. Image quality was good. The patient developed Mild vagal event during intubation during the procedure.  IMPRESSIONS  1. Left ventricular ejection fraction, by estimation, is 55 to 60%. The left ventricle has normal function. The left ventricle has no regional wall motion abnormalities.  2. Right ventricular systolic function is normal. The right ventricular size  is normal.  3. Left atrial size was mildly dilated. No left atrial/left atrial appendage thrombus was detected.  4. The mitral valve is degenerative. Trivial mitral valve regurgitation. No evidence of mitral stenosis. Moderate mitral annular calcification.  5. The aortic valve is tricuspid. There is mild calcification of the aortic valve. There is mild thickening of the aortic valve. Aortic valve regurgitation is not visualized. Aortic valve sclerosis is present, with no evidence of aortic valve stenosis.  6. There is Moderate (Grade III) plaque.  7. 3D performed of the mitral valve and 3D performed of the aortic valve and demonstrates No evidence of cardiac vegetation. Conclusion(s)/Recommendation(s): No evidence of vegetation/infective endocarditis on this transesophageael echocardiogram. FINDINGS  Left Ventricle: Left ventricular ejection fraction, by estimation, is 55 to 60%. The left ventricle has normal function. The left ventricle has no regional wall motion abnormalities. The left ventricular internal cavity size was normal in size. There is  no left ventricular hypertrophy. Right Ventricle: The right ventricular size is normal. No increase in right ventricular wall thickness. Right ventricular systolic function is normal. Left Atrium: Left atrial size was mildly dilated. No left atrial/left atrial appendage thrombus was detected. Right Atrium: Right atrial size was normal in size. Pericardium: There is no evidence of pericardial effusion. Mitral Valve: Calcified nodule on the posterior chord, but no evidence of vegetation. The mitral valve is degenerative in appearance. Moderate mitral annular calcification. Trivial mitral valve regurgitation. No evidence of mitral valve stenosis. Tricuspid Valve: The tricuspid valve is normal in structure. Tricuspid valve regurgitation is mild. There is no evidence of tricuspid valve vegetation. Aortic Valve: The aortic valve is tricuspid. There is mild calcification of  the aortic valve. There is mild thickening of the aortic valve. Aortic valve regurgitation is not visualized. Aortic valve sclerosis is present, with no evidence of aortic valve stenosis. There is no evidence of aortic valve vegetation.  Pulmonic Valve: The pulmonic valve was normal in structure. Pulmonic valve regurgitation is trivial. Aorta: The aortic root and ascending aorta are structurally normal, with no evidence of dilitation. There is moderate (Grade III) plaque. IAS/Shunts: No atrial level shunt detected by color flow Doppler. Additional Comments: 3D was performed not requiring image post processing on an independent workstation and was normal. Morene Brownie Electronically signed by Morene Brownie Signature Date/Time: 03/02/2024/1:57:36 PM    Final     Vitals:   03/03/24 0630 03/03/24 0645 03/03/24 0700 03/03/24 0805  BP: (!) 122/40 (!) 115/41 (!) 114/42   Pulse: 63 65 64 69  Resp: 20 18 18 20   Temp: 99.5 F (37.5 C) 99.5 F (37.5 C) 99.5 F (37.5 C) 99.5 F (37.5 C)  TempSrc:      SpO2: 96% 96% 96% 96%  Weight:      Height:         PHYSICAL EXAM General: Ill-appearing, elderly patient in no acute distress CV: Regular rate and rhythm on monitor Respiratory: Respirations synchronous with ventilator when sedated, bites tube and dyssynchronus when precedex and fentanyl  paused for exam   NEURO  Patient opens eyes to voice after sedation paused but does not track or follow commands.   Pupils small but equal round and reactive, oculocephalic reflex present, patient will grimace to noxious stimuli.  Eyes midline, no clear blink to threat on my exam, previously intermittently blinking to visual threat on the right but not on the left.   She will move right upper extremity with antigravity strength, flickers left upper extremity and bilateral lower extremities to noxious  ASSESSMENT/PLAN  Ms. Maevis Feiga Nadel is a 78 y.o. female with history of breast cancer, COPD, sleep  apnea, CKD, hypertension, hyperlipidemia and diabetes after being found down with tachycardia fever and encephalopathy.  She was intubated for airway protection.  Lumbar puncture was performed to rule out infectious source, and meningitis is no longer suspected.  MRI reveals multiple acute ischemic infarcts, largest in the right MCA territory.  Discussed with nursing and Dr. Quenten -- at this time no further inpatient neurological workup and most of her mental status issues felt to be due to sedation / medical issues. Nursing notes she was following commands for him yesterday prior to needing increased sedation for vent issues, not following commands again today. Inpatient neurology will sign off but please reach out with any questions/concerns.   Stroke: Right MCA territory infarct as well as multiple bilateral small embolic appearing infarcts, etiology: still concerning for endocarditis which per ID will continue to treat for endocarditis, can not rule out hypercoagulable state from severe sepsis CT head No acute abnormality.  CTA head & neck no LVO or hemodynamically significant stenosis, moderate to severe stenosis at origin of nondominant left VA, mild stenosis of right VA origin and bilateral cavernous ICAs MRI restricted diffusion laterally within right mid and posterior temporal lobe and numerous foci of restricted diffusion involving cortex and subcortical white matter disease bilaterally, infarcts appear embolic MRA neg 2D Echo EF 60 to 65%  TEE - Normal LV and RV function No evidence of endocarditis  EEG with continuous slowing with no seizures or epileptiform discharges LDL 78 HgbA1c 7.0 VTE prophylaxis -Lovenox  aspirin  81 mg daily prior to admission, now on home aspirin  81 mg daily.  Therapy recommendations:  Pending Disposition: Pending  Strep bacteremia Leukocytosis WBC 12.9-12.3-10.6 Blood cultures positive with strep 10/13 Repeat blood cultures negative so far CT C/A/P  neg TEE 10/17  negative for endocarditis  Continue ceftriaxone 2 mg every 12 hours ID on board  Encephalopathy Patient was found with altered mental status on admission Strokes may be partially contributing to encephalopathy Toxic metabolic encephalopathy in the setting of multiple metabolic derangements also likely due to ongoing sedation needs for vent management EEG reveals no seizures or epileptiform activity but was performed when patient was on propofol , so could consider repeating if mental status changes develop that are not correlating with sedation / other medical issues  Hypertension Home meds: Spironolactone  25 mg daily, carvedilol  6.25 mg twice daily, amlodipine  10 mg daily Stable, was requiring Levophed for hypotension yesterday but now stable Long term BP goal normmotensive  Hyperlipidemia Home meds: None LDL 78, goal < 70 Held off statin initially due to elevated CK and AST May initiate atorva 20 mg daily when other medical conditions allow, or just prior to discharge  Diabetes type II Controlled Home meds:  Farxiga  10 mg daily, Janumet  XR 50-1000 daily HgbA1c 7.0, goal < 7.0 CBGs SSI Recommend close follow-up with PCP for continued DM control  Respiratory failure Patient intubated for airway protection Ventilator management per CCM Extubate when able  AKI and rhabdomyolysis CK peaked at 2418, now normalized Creatinine 1.37 now stablized to 0.8 - 0.9 Renally dose medications as appropriate IV fluids per primary team  Dysphagia Patient has post-stroke dysphagia, SLP consulted On TF while intubated  Other Stroke Risk Factors Obesity, Body mass index is 38.38 kg/m., BMI >/= 30 associated with increased stroke risk, recommend weight loss, diet and exercise as appropriate  Obstructive sleep apnea  Other Active Problems Breast cancer s/p left lumpectomy  in 2019  Hospital day # 4  Lola Jernigan MD-PhD [facility] [phone] 8 AM to 6  PM  To contact Stroke Continuity provider, please refer to [email or web address]. After hours, contact General Neurology

## 2024-03-03 NOTE — Progress Notes (Signed)
 eLink Physician-Brief Progress Note Patient Name: Victoria [name] DOB: 06-06-1945 MRN: [id number]   Date of Service  03/03/2024  HPI/Events of Note  Patient with asymptomatic bradycardia into the 30's transiently.  eICU Interventions  Precedex and PRN Labetalol discontinued, PRN Hydralazine  substituted.        Victoria [name] 03/03/2024, 2:25 AM

## 2024-03-03 NOTE — Evaluation (Signed)
 RT Evaluate and Treat Note  03/03/2024   Breathing is (select one): Cannot answer questions patient is on ventilator   The following was found on auscultation (select multiple):  Bilateral Breath Sounds: Rhonchi (03/03/24 0805)  R Upper  Breath Sounds: Rhonchi (03/03/24 0805) L Upper Breath Sounds: Rhonchi (03/03/24 0805) R Lower Breath Sounds: Diminished (03/03/24 0805) L Lower Breath Sounds: Diminished (03/03/24 0805)    Cough Assessment: Cough: None (03/03/24 0805)    Most Recent Chest Xray:... (DG CHEST PORT 1 VIEW Result Date: 03/02/2024 CLINICAL DATA:  200808 Hypoxia [id number] EXAM: PORTABLE CHEST 1 VIEW COMPARISON:  03/02/2024. FINDINGS: Low lung volume. Bilateral lung fields are clear. Bilateral costophrenic angles are clear. Stable cardio-mediastinal silhouette. No acute osseous abnormalities. The soft tissues are within normal limits. An enteric tube extends below diaphragm, tip out of the view of the radiograph. Endotracheal tube is not well seen.  Correlate with prior exam. IMPRESSION: No active disease. Electronically Signed   By: Ree Molt M.D.   On: 03/02/2024 14:32      The following medications and/or interventions were ordered/changed/discontinued as part of the Respiratory Treatment protocol:   Medication Changes: None   Airway Clearance Changes: None Continue ET tube suction as needed   Oxygen  Therapy Changes: None Currently on the ventilator

## 2024-03-04 ENCOUNTER — Inpatient Hospital Stay (HOSPITAL_COMMUNITY)

## 2024-03-04 DIAGNOSIS — R7881 Bacteremia: Secondary | ICD-10-CM | POA: Diagnosis not present

## 2024-03-04 DIAGNOSIS — E119 Type 2 diabetes mellitus without complications: Secondary | ICD-10-CM | POA: Diagnosis not present

## 2024-03-04 DIAGNOSIS — B950 Streptococcus, group A, as the cause of diseases classified elsewhere: Secondary | ICD-10-CM | POA: Diagnosis not present

## 2024-03-04 DIAGNOSIS — J9601 Acute respiratory failure with hypoxia: Secondary | ICD-10-CM | POA: Diagnosis not present

## 2024-03-04 LAB — LUPUS ANTICOAGULANT
DRVVT: 40 s (ref 0.0–47.0)
PTT Lupus Anticoagulant: 35.9 s (ref 0.0–43.5)
Thrombin Time: 16.4 s (ref 0.0–23.0)
dPT Confirm Ratio: 1.06 ratio (ref 0.00–1.34)
dPT: 30.1 s (ref 0.0–47.6)

## 2024-03-04 LAB — CBC
HCT: 31.8 % — ABNORMAL LOW (ref 36.0–46.0)
Hemoglobin: 9.7 g/dL — ABNORMAL LOW (ref 12.0–15.0)
MCH: 27.6 pg (ref 26.0–34.0)
MCHC: 30.5 g/dL (ref 30.0–36.0)
MCV: 90.6 fL (ref 80.0–100.0)
Platelets: 133 K/uL — ABNORMAL LOW (ref 150–400)
RBC: 3.51 MIL/uL — ABNORMAL LOW (ref 3.87–5.11)
RDW: 15.7 % — ABNORMAL HIGH (ref 11.5–15.5)
WBC: 9.4 K/uL (ref 4.0–10.5)
nRBC: 0 % (ref 0.0–0.2)

## 2024-03-04 LAB — BASIC METABOLIC PANEL WITH GFR
Anion gap: 11 (ref 5–15)
BUN: 26 mg/dL — ABNORMAL HIGH (ref 8–23)
CO2: 27 mmol/L (ref 22–32)
Calcium: 8.3 mg/dL — ABNORMAL LOW (ref 8.9–10.3)
Chloride: 103 mmol/L (ref 98–111)
Creatinine, Ser: 1.04 mg/dL — ABNORMAL HIGH (ref 0.44–1.00)
GFR, Estimated: 54 mL/min — ABNORMAL LOW (ref >60)
Glucose, Bld: 148 mg/dL — ABNORMAL HIGH (ref 70–99)
Potassium: 4 mmol/L (ref 3.5–5.1)
Sodium: 141 mmol/L (ref 135–145)

## 2024-03-04 LAB — MAGNESIUM: Magnesium: 2 mg/dL (ref 1.7–2.4)

## 2024-03-04 LAB — GLUCOSE, CAPILLARY
Glucose-Capillary: 150 mg/dL — ABNORMAL HIGH (ref 70–99)
Glucose-Capillary: 166 mg/dL — ABNORMAL HIGH (ref 70–99)
Glucose-Capillary: 176 mg/dL — ABNORMAL HIGH (ref 70–99)
Glucose-Capillary: 152 mg/dL — ABNORMAL HIGH (ref 70–99)
Glucose-Capillary: 192 mg/dL — ABNORMAL HIGH (ref 70–99)

## 2024-03-04 LAB — PHOSPHORUS: Phosphorus: 3.5 mg/dL (ref 2.5–4.6)

## 2024-03-04 MED ORDER — FUROSEMIDE 10 MG/ML IJ SOLN
160.0000 mg | Freq: Once | INTRAVENOUS | Status: AC
  Administered 2024-03-04: 160 mg via INTRAVENOUS
  Filled 2024-03-04: qty 16

## 2024-03-04 MED ORDER — QUETIAPINE FUMARATE 25 MG PO TABS
25.0000 mg | ORAL_TABLET | Freq: Every day | ORAL | Status: DC
  Administered 2024-03-04: 25 mg
  Filled 2024-03-04: qty 1

## 2024-03-04 MED ORDER — FENTANYL 2500MCG IN NS 250ML (10MCG/ML) PREMIX INFUSION
0.0000 ug/h | INTRAVENOUS | Status: DC
  Administered 2024-03-04: 150 ug/h via INTRAVENOUS
  Administered 2024-03-04: 200 ug/h via INTRAVENOUS
  Administered 2024-03-05: 150 ug/h via INTRAVENOUS
  Administered 2024-03-05: 175 ug/h via INTRAVENOUS
  Administered 2024-03-06: 150 ug/h via INTRAVENOUS
  Administered 2024-03-07 (×2): 200 ug/h via INTRAVENOUS
  Administered 2024-03-08: 175 ug/h via INTRAVENOUS
  Administered 2024-03-08 – 2024-03-09 (×2): 200 ug/h via INTRAVENOUS
  Administered 2024-03-09: 175 ug/h via INTRAVENOUS
  Administered 2024-03-10: 100 ug/h via INTRAVENOUS
  Filled 2024-03-04 (×11): qty 250

## 2024-03-04 MED ORDER — CHLORHEXIDINE GLUCONATE CLOTH 2 % EX PADS
6.0000 | MEDICATED_PAD | Freq: Every day | CUTANEOUS | Status: DC
  Administered 2024-03-04 – 2024-03-10 (×7): 6 via TOPICAL

## 2024-03-04 MED ORDER — SODIUM CHLORIDE 0.9% FLUSH
10.0000 mL | Freq: Two times a day (BID) | INTRAVENOUS | Status: DC
  Administered 2024-03-04: 10 mL
  Administered 2024-03-04: 20 mL
  Administered 2024-03-05 – 2024-03-07 (×5): 10 mL
  Administered 2024-03-08: 20 mL
  Administered 2024-03-08 – 2024-03-10 (×5): 10 mL
  Administered 2024-03-11: 40 mL

## 2024-03-04 MED ORDER — LABETALOL HCL 5 MG/ML IV SOLN
5.0000 mg | INTRAVENOUS | Status: DC | PRN
  Administered 2024-03-05 – 2024-03-11 (×9): 5 mg via INTRAVENOUS
  Filled 2024-03-04 (×9): qty 4

## 2024-03-04 MED ORDER — POLYETHYLENE GLYCOL 3350 17 G PO PACK
17.0000 g | PACK | Freq: Every day | ORAL | Status: DC
  Administered 2024-03-05 – 2024-03-10 (×6): 17 g
  Filled 2024-03-04 (×6): qty 1

## 2024-03-04 MED ORDER — ACETAMINOPHEN 160 MG/5ML PO SOLN
650.0000 mg | Freq: Four times a day (QID) | ORAL | Status: DC | PRN
  Administered 2024-03-04 – 2024-03-10 (×7): 650 mg
  Filled 2024-03-04 (×8): qty 20.3

## 2024-03-04 MED ORDER — SODIUM CHLORIDE 0.9% FLUSH: 10.0000 mL | INTRAVENOUS | Status: DC | PRN

## 2024-03-04 NOTE — Plan of Care (Signed)
  Problem: Nutritional: Goal: Maintenance of adequate nutrition will improve Outcome: Progressing   Problem: Fluid Volume: Goal: Ability to maintain a balanced intake and output will improve Outcome: Not Progressing

## 2024-03-04 NOTE — Progress Notes (Addendum)
 NAME:  Victoria [name], MRN:  [id number], DOB:  Sep 04, 1945, LOS: 5 ADMISSION DATE:  02/27/2024, CONSULTATION DATE:  02/28/24 REFERRING MD:  Haze, CHIEF COMPLAINT:  Acute respiratory failure   History of Present Illness:  Patient is an 78 yo F w/ pertinent PMH COPD on 2L [place] at home, OSA, HTN, HLD, T2DM, CKD 3a presents to [facility] on 10/14 w/ AMS found to have multiple embolic strokes and strep bacteremia, requiring invasive mechanical ventilation.    [name] LKN 10/10. Patient found on 10/13 on floor confused by EMS, brought to [facility] ED. Patient arrived to ED meeting SIRS criteria, became lethargic with increased respiratory distress requiring intubation.     Patient remains intubated, now off levophed and precedex, on fentanyl  and propofol  drip. Though no definitive embolic source identified on TEE, plan for prolonged IV abx for endocarditis due to high clinical suspicion.    Pertinent  Medical History   Past Medical History:  Diagnosis Date   Abdominal aneurysm    Dr. Harvey follows lLOV 2 ''17 per pt around 2 cm   Anemia    as a child   Arthritis    left ankle, right knee, right SI joint, wrists, lower back   Breast cancer in female [facility])    Left   COPD (chronic obstructive pulmonary disease) (HCC)    ephysema-Dr. 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 Eletha in [place]   Sleep apnea    cpap used sometimes, uses oxygen  concentrator 2 l/m nasally bedtime   Varicella as child    Significant Hospital Events: Including procedures, antibiotic start and stop dates in addition  to other pertinent events   10/14: Admitted to ICU requiring MIV  10/18 failing PSV weans due to agitation.  Transient episodes of bradycardia  Interim History / Subjective:   Transient episodes of bradycardia Remains on the ventilator.  Objective    Blood pressure (!) 157/45, pulse (!) 101, temperature 99.9 F (37.7 C), temperature source Bladder, resp. rate 20, height 5' 4.5 (1.638 m), weight 105 kg, SpO2 93%.    Vent Mode: PRVC FiO2 (%):  [40 %] 40 % Set Rate:  [20 bmp] 20 bmp Vt Set:  [420 mL] 420 mL PEEP:  [5 cmH20] 5 cmH20 Plateau Pressure:  [13 cmH20-15 cmH20] 15 cmH20   Intake/Output Summary (Last 24 hours) at 03/04/2024 0829 Last data filed at 03/04/2024 0800 Gross per 24 hour  Intake 2258.22 ml  Output 1300 ml  Net 958.22 ml   Filed Weights   03/01/24 0500 03/03/24 0500 03/04/24 0500  Weight: 98.1 kg 103 kg 105 kg    Examination: Elderly female in no acute distress Intubated, crackles bilaterally on auscultation Sedated, does not follow commands 1+ edema in the upper extremities  Lab/imaging review Significant for BUN/creatinine 26/1.04 Hemoglobin 9.7, platelets 133 No new imaging  Resolved problem list   Rhabdomyolysis AKI  Assessment and Plan  Acute metabolic encephalopathy Strep bacteremia  TTE and TEE  without source of embolism identified. Brain MRA normal. CSF non-infectious. S/p broad spectrum abx, now on CTX. Plan for treatment of presumptive infective endocarditis per ID.  Afebrile overnight.  Continue ceftriaxone per ID Has agitation when sedation is weaned.  Off Precedex due to bradycardia Start Seroquel Follow cultures.  Acute hypoxic respiratory failure History of severe COPD PSV weans as tolerated, follow intermittent chest x-ray Still fluid overloaded with Lasix  80 mg twice daily Will try Lasix  160 mg x 1 Monitor creatinine while on diuresis On Brovana , Pulmicort , Yupelri  nebs and Daliresp   Diabetes mellitus SSI  coverage  Checklist Lovenox , PPI Foley For PICC line placement today Son Victoria [name] updated 10/19   Critical care time:   The patient is critically ill with multiple organ system failure and requires high complexity decision making for assessment and support, frequent evaluation and titration of therapies, advanced monitoring, review of radiographic studies and interpretation of complex data.   Critical Care Time devoted to patient care services, exclusive of separately billable procedures, described in this note is 35 minutes.   Victoria Pollard MD [facility] See Amion for pager  If no response to pager , please call [phone] until 7pm After 7:00 pm call Elink  [phone] 03/04/2024, 8:29 AM

## 2024-03-04 NOTE — Plan of Care (Signed)
  Problem: Fluid Volume: Goal: Ability to maintain a balanced intake and output will improve Outcome: Progressing   Problem: Nutrition: Goal: Adequate nutrition will be maintained Outcome: Progressing   Problem: Elimination: Goal: Will not experience complications related to bowel motility Outcome: Progressing Goal: Will not experience complications related to urinary retention Outcome: Progressing   Problem: Pain Managment: Goal: General experience of comfort will improve and/or be controlled Outcome: Progressing   Problem: Safety: Goal: Ability to remain free from injury will improve Outcome: Progressing

## 2024-03-04 NOTE — Progress Notes (Signed)
 Peripherally Inserted Central Catheter Placement  The IV Nurse has discussed with the patient and/or persons authorized to consent for the patient, the purpose of this procedure and the potential benefits and risks involved with this procedure.  The benefits include less needle sticks, lab draws from the catheter, and the patient may be discharged home with the catheter. Risks include, but not limited to, infection, bleeding, blood clot (thrombus formation), and puncture of an artery; nerve damage and irregular heartbeat and possibility to perform a PICC exchange if needed/ordered by physician.  Alternatives to this procedure were also discussed.  Bard Power PICC patient education guide, fact sheet on infection prevention and patient information card has been provided to patient /or left at bedside. Telephone consent obtained from son.   PICC Placement Documentation  PICC Double Lumen 03/04/24 Right Brachial 39 cm 0 cm (Active)  Indication for Insertion or Continuance of Line Vasoactive infusions 03/04/24 0917  Exposed Catheter (cm) 0 cm 03/04/24 0917  Site Assessment Clean, Dry, Intact 03/04/24 0917  Lumen #1 Status Flushed;Saline locked;Blood return noted 03/04/24 0917  Lumen #2 Status Flushed;Saline locked;Blood return noted 03/04/24 0917  Dressing Type Transparent;Securing device 03/04/24 9082  Dressing Status Antimicrobial disc/dressing in place;Clean, Dry, Intact 03/04/24 0917  Line Care Connections checked and tightened 03/04/24 9082  Line Adjustment (NICU/IV Team Only) No 03/04/24 0917  Dressing Intervention New dressing;Adhesive placed at insertion site (IV team only);Adhesive placed around edges of dressing (IV team/ICU RN only) 03/04/24 0917  Dressing Change Due 02/20/2024 03/04/24 0917       Victoria [name] 03/04/2024, 9:18 AM

## 2024-03-04 NOTE — Progress Notes (Signed)
 eLink Physician-Brief Progress Note Patient Name: Victoria [name] DOB: [birth year] MRN: [id number]   Date of Service  03/04/2024  HPI/Events of Note  Received request to renew Fentanyl  drip ordered Currently running at 200 mcg /hr  Seen adequately sedated  eICU Interventions  Fentanyl  titratable infusion reordered     Intervention Category Intermediate Interventions: Other:  Damien ONEIDA Grout 03/04/2024, 5:52 AM

## 2024-03-04 NOTE — Evaluation (Signed)
 RT Evaluate and Treat Note  03/04/2024   Breathing is (select one): Same as normal    The following was found on auscultation (select multiple):  Bilateral Breath Sounds: Rhonchi;Diminished (03/04/24 0758)  R Upper  Breath Sounds: Rhonchi (03/04/24 0758) L Upper Breath Sounds: Rhonchi (03/04/24 0758) R Lower Breath Sounds: Diminished (03/04/24 0758) L Lower Breath Sounds: Diminished (03/04/24 0758)    Cough Assessment: Cough: None (03/04/24 0758)    Most Recent Chest Xray:... (No results found.    The following medications and/or interventions were ordered/changed/discontinued as part of the Respiratory Treatment protocol:   Medication Changes: None   Airway Clearance Changes: None   Oxygen  Therapy Changes: None, remains on ventilator

## 2024-03-04 NOTE — Progress Notes (Signed)
 eLink Physician-Brief Progress Note Patient Name: Victoria [name] DOB: 1945-11-17 MRN: [id number]   Date of Service  03/04/2024  HPI/Events of Note  HTN 181/47 (83)  HR 89  PRN hydralazine  is 5-10 mg every 2 hrs, last received 0252 Labetalol discontinued last night due to bradycardia while also on Precedex last night which has been discontinued as well  Seen on vent adequately sedated on propofol  and fentanyl  BP 157/43  HR 92  eICU Interventions  Ordered low dose labetalol 5 mg IV q 2 prn for SBP > 180     Intervention Category Intermediate Interventions: Hypertension - evaluation and management  Damien DASEN Kallin Henk 03/04/2024, 4:17 AM

## 2024-03-05 ENCOUNTER — Inpatient Hospital Stay (HOSPITAL_COMMUNITY)

## 2024-03-05 ENCOUNTER — Telehealth: Payer: Self-pay

## 2024-03-05 DIAGNOSIS — T827XXA Infection and inflammatory reaction due to other cardiac and vascular devices, implants and grafts, initial encounter: Secondary | ICD-10-CM | POA: Diagnosis present

## 2024-03-05 DIAGNOSIS — I6340 Cerebral infarction due to embolism of unspecified cerebral artery: Secondary | ICD-10-CM

## 2024-03-05 DIAGNOSIS — I330 Acute and subacute infective endocarditis: Secondary | ICD-10-CM | POA: Diagnosis not present

## 2024-03-05 DIAGNOSIS — R7881 Bacteremia: Secondary | ICD-10-CM | POA: Diagnosis not present

## 2024-03-05 DIAGNOSIS — J9601 Acute respiratory failure with hypoxia: Secondary | ICD-10-CM | POA: Diagnosis not present

## 2024-03-05 DIAGNOSIS — I76 Septic arterial embolism: Secondary | ICD-10-CM | POA: Diagnosis not present

## 2024-03-05 DIAGNOSIS — E119 Type 2 diabetes mellitus without complications: Secondary | ICD-10-CM | POA: Diagnosis not present

## 2024-03-05 DIAGNOSIS — Z880 Allergy status to penicillin: Secondary | ICD-10-CM | POA: Diagnosis not present

## 2024-03-05 DIAGNOSIS — I38 Endocarditis, valve unspecified: Secondary | ICD-10-CM | POA: Diagnosis present

## 2024-03-05 DIAGNOSIS — B950 Streptococcus, group A, as the cause of diseases classified elsewhere: Secondary | ICD-10-CM | POA: Diagnosis not present

## 2024-03-05 LAB — CYTOLOGY - NON PAP

## 2024-03-05 LAB — CULTURE, BLOOD (ROUTINE X 2)
Culture: NO GROWTH
Culture: NO GROWTH
Special Requests: ADEQUATE
Special Requests: ADEQUATE

## 2024-03-05 LAB — GLUCOSE, CAPILLARY
Glucose-Capillary: 171 mg/dL — ABNORMAL HIGH (ref 70–99)
Glucose-Capillary: 155 mg/dL — ABNORMAL HIGH (ref 70–99)
Glucose-Capillary: 170 mg/dL — ABNORMAL HIGH (ref 70–99)
Glucose-Capillary: 162 mg/dL — ABNORMAL HIGH (ref 70–99)
Glucose-Capillary: 163 mg/dL — ABNORMAL HIGH (ref 70–99)
Glucose-Capillary: 166 mg/dL — ABNORMAL HIGH (ref 70–99)
Glucose-Capillary: 180 mg/dL — ABNORMAL HIGH (ref 70–99)

## 2024-03-05 LAB — BETA-2-GLYCOPROTEIN I ABS, IGG/M/A
Beta-2 Glyco I IgG: <9 GPI IgG units (ref 0–20)
Beta-2-Glycoprotein I IgA: <9 GPI IgA units (ref 0–25)
Beta-2-Glycoprotein I IgM: <9 GPI IgM units (ref 0–32)

## 2024-03-05 LAB — CBC
HCT: 30.2 % — ABNORMAL LOW (ref 36.0–46.0)
Hemoglobin: 9 g/dL — ABNORMAL LOW (ref 12.0–15.0)
MCH: 27.7 pg (ref 26.0–34.0)
MCHC: 29.8 g/dL — ABNORMAL LOW (ref 30.0–36.0)
MCV: 92.9 fL (ref 80.0–100.0)
Platelets: 189 K/uL (ref 150–400)
RBC: 3.25 MIL/uL — ABNORMAL LOW (ref 3.87–5.11)
RDW: 15.9 % — ABNORMAL HIGH (ref 11.5–15.5)
WBC: 9.1 K/uL (ref 4.0–10.5)
nRBC: 0 % (ref 0.0–0.2)

## 2024-03-05 LAB — BASIC METABOLIC PANEL WITH GFR
Anion gap: 9 (ref 5–15)
Anion gap: 11 (ref 5–15)
BUN: 32 mg/dL — ABNORMAL HIGH (ref 8–23)
BUN: 36 mg/dL — ABNORMAL HIGH (ref 8–23)
CO2: 29 mmol/L (ref 22–32)
CO2: 26 mmol/L (ref 22–32)
Calcium: 8.1 mg/dL — ABNORMAL LOW (ref 8.9–10.3)
Calcium: 7.9 mg/dL — ABNORMAL LOW (ref 8.9–10.3)
Chloride: 101 mmol/L (ref 98–111)
Chloride: 106 mmol/L (ref 98–111)
Creatinine, Ser: 1.21 mg/dL — ABNORMAL HIGH (ref 0.44–1.00)
Creatinine, Ser: 1.22 mg/dL — ABNORMAL HIGH (ref 0.44–1.00)
GFR, Estimated: 45 mL/min — ABNORMAL LOW (ref >60)
GFR, Estimated: 45 mL/min — ABNORMAL LOW (ref >60)
Glucose, Bld: 164 mg/dL — ABNORMAL HIGH (ref 70–99)
Glucose, Bld: 173 mg/dL — ABNORMAL HIGH (ref 70–99)
Potassium: 4.1 mmol/L (ref 3.5–5.1)
Potassium: 4.6 mmol/L (ref 3.5–5.1)
Sodium: 139 mmol/L (ref 135–145)
Sodium: 143 mmol/L (ref 135–145)

## 2024-03-05 LAB — CARDIOLIPIN ANTIBODIES, IGG, IGM, IGA
Anticardiolipin IgA: <9 U/mL (ref 0–11)
Anticardiolipin IgG: <9 GPL U/mL (ref 0–14)
Anticardiolipin IgM: <9 [MPL'U]/mL (ref 0–12)

## 2024-03-05 LAB — BRAIN NATRIURETIC PEPTIDE: B Natriuretic Peptide: 301.3 pg/mL — ABNORMAL HIGH (ref 0.0–100.0)

## 2024-03-05 LAB — MAGNESIUM: Magnesium: 2.1 mg/dL (ref 1.7–2.4)

## 2024-03-05 MED ORDER — DIPHENHYDRAMINE HCL 50 MG/ML IJ SOLN: 25.0000 mg | Freq: Once | INTRAMUSCULAR | Status: DC | PRN

## 2024-03-05 MED ORDER — FUROSEMIDE 10 MG/ML IJ SOLN
160.0000 mg | Freq: Once | INTRAVENOUS | Status: AC
  Administered 2024-03-05: 160 mg via INTRAVENOUS
  Filled 2024-03-05: qty 10

## 2024-03-05 MED ORDER — ALBUMIN HUMAN 25 % IV SOLN
12.5000 g | Freq: Once | INTRAVENOUS | Status: AC
  Administered 2024-03-05: 12.5 g via INTRAVENOUS
  Filled 2024-03-05: qty 50

## 2024-03-05 MED ORDER — SENNA 8.6 MG PO TABS: 1.0000 | ORAL_TABLET | Freq: Every day | ORAL | Status: DC | PRN

## 2024-03-05 MED ORDER — QUETIAPINE FUMARATE 25 MG PO TABS
25.0000 mg | ORAL_TABLET | Freq: Two times a day (BID) | ORAL | Status: DC
  Administered 2024-03-05 – 2024-03-09 (×10): 25 mg
  Filled 2024-03-05 (×10): qty 1

## 2024-03-05 MED ORDER — INSULIN GLARGINE-YFGN 100 UNIT/ML ~~LOC~~ SOLN
8.0000 [IU] | Freq: Every day | SUBCUTANEOUS | Status: DC
  Administered 2024-03-05: 8 [IU] via SUBCUTANEOUS
  Filled 2024-03-05 (×2): qty 0.08

## 2024-03-05 MED ORDER — METHOCARBAMOL 500 MG PO TABS
500.0000 mg | ORAL_TABLET | Freq: Three times a day (TID) | ORAL | Status: DC
  Administered 2024-03-05 – 2024-03-09 (×15): 500 mg
  Filled 2024-03-05 (×15): qty 1

## 2024-03-05 MED ORDER — EPINEPHRINE 0.3 MG/0.3ML IJ SOAJ
0.3000 mg | Freq: Once | INTRAMUSCULAR | Status: DC | PRN
  Filled 2024-03-05: qty 0.3

## 2024-03-05 MED ORDER — AMOXICILLIN 250 MG/5ML PO SUSR
500.0000 mg | Freq: Once | ORAL | Status: AC
Start: 2024-03-05 — End: 2024-03-05
  Administered 2024-03-05: 500 mg
  Filled 2024-03-05 (×2): qty 10

## 2024-03-05 MED ORDER — METOLAZONE 5 MG PO TABS
5.0000 mg | ORAL_TABLET | Freq: Once | ORAL | Status: AC
  Administered 2024-03-05: 5 mg
  Filled 2024-03-05: qty 1

## 2024-03-05 MED ORDER — BISACODYL 10 MG RE SUPP
10.0000 mg | Freq: Once | RECTAL | Status: AC
  Administered 2024-03-05: 10 mg via RECTAL
  Filled 2024-03-05: qty 1

## 2024-03-05 MED ORDER — PROSOURCE TF20 ENFIT COMPATIBL EN LIQD
60.0000 mL | Freq: Every day | ENTERAL | Status: DC
  Administered 2024-03-05 – 2024-03-11 (×7): 60 mL
  Filled 2024-03-05 (×7): qty 60

## 2024-03-05 NOTE — Telephone Encounter (Signed)
 Copied from CRM [id number]. Topic: General - Other >> Mar 05, 2024 10:35 AM Celestine FALCON wrote: Reason for CRM: [name] is calling for the pt Victoria [name] in regards to her being admitted at [facility] on 10/13 still there presently.   Pt was there due to primarily altered mental status and sepsis, however her breathing has become questionable due to her oxygen  level showing 40 percent.   [name] wanted to make sure Dr. Geronimo was aware of the pt's current state and has requested he check on his patient. [name] stated the pt had a series of small strokes as well. Pt has COPD, empazeyma, and is on oxygen  constantly.   [name]'s phone number is [phone] ok to leave a vm.  ATC x1. LMTCB Routing to Dr. Geronimo

## 2024-03-05 NOTE — TOC Progression Note (Signed)
 Transition of Care [facility]) - Progression Note    Patient Details  Name: Victoria [name] MRN: [id number] Date of Birth: 1945-12-22  [facility] CM/SW Contact  Tom-Johnson, Harvest Muskrat, RN Phone Number: 03/05/2024, 2:10 PM  Clinical Narrative:     Patient continues to be intubated sedated and diuresing. 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   Patient not Medically ready for discharge.  CM will continue to follow as patient progresses with care towards discharge.                       Expected Discharge Plan and Services                                               Social Drivers of Health (SDOH) Interventions SDOH Screenings   Food Insecurity: Patient Unable To Answer (03/05/2024)  Housing: Unknown (03/05/2024)  Transportation Needs: Patient Unable To Answer (03/05/2024)  Utilities: Patient Unable To Answer (03/05/2024)  Alcohol Screen: Low Risk  (06/02/2022)  Depression (PHQ2-9): Low Risk  (11/24/2022)  Financial Resource Strain: Low Risk  (06/02/2022)  Physical Activity: Inactive (06/02/2022)  Social Connections: Patient Unable To Answer (03/05/2024)  Stress: No Stress Concern Present (06/02/2022)  Tobacco Use: Medium Risk (02/28/2024)    Readmission Risk Interventions     No data to display

## 2024-03-05 NOTE — Progress Notes (Signed)
 Pharmacy: Antimicrobial Stewardship Note  78 YOF with allergy to penicillin  noted to be added to the chart this admission. Upon further conversation with the patient's son Elnita) and daughter-in-law Lessie) this was something added by [name] when asked as she thought perhaps her mother-in-law was allergic.   Review of other charts have no documentation of a penicillin  allergy and notes indicate the patient has received Penicillin  VK, amoxicillin /clavulanic acid, and piperacillin/tazobactam previously without a known documented reaction.  The plan is to give a 1x dose of oral Amoxicillin  per tube and if tolerated without issues, will transition to IV penicillin  tomorrow.   Thank you for allowing pharmacy to be a part of this patient's care.  Almarie Lunger, PharmD, BCPS, BCIDP Infectious Diseases Clinical Pharmacist 03/05/2024 12:48 PM   **Pharmacist phone directory can now be found on [email or web address] ([email or web address]).  Listed under [facility].

## 2024-03-05 NOTE — Progress Notes (Signed)
 Subjective:  intubated   Antibiotics:  Anti-infectives (From admission, onward)    Start     Dose/Rate Route Frequency Ordered Stop   02/29/24 2200  cefTRIAXone (ROCEPHIN) 2 g in sodium chloride  0.9 % 100 mL IVPB        2 g 200 mL/hr over 30 Minutes Intravenous Every 12 hours 02/29/24 0912     02/28/24 2300  vancomycin (VANCOREADY) IVPB 750 mg/150 mL  Status:  Discontinued        750 mg 150 mL/hr over 60 Minutes Intravenous Every 24 hours 02/28/24 0604 02/28/24 1723   02/28/24 1000  metroNIDAZOLE (FLAGYL) IVPB 500 mg  Status:  Discontinued        500 mg 100 mL/hr over 60 Minutes Intravenous Every 12 hours 02/28/24 0553 02/28/24 1723   02/28/24 1000  ceFEPIme (MAXIPIME) 2 g in sodium chloride  0.9 % 100 mL IVPB  Status:  Discontinued        2 g 200 mL/hr over 30 Minutes Intravenous Every 12 hours 02/28/24 0604 02/29/24 0912   02/27/24 2230  ceFEPIme (MAXIPIME) 2 g in sodium chloride  0.9 % 100 mL IVPB        2 g 200 mL/hr over 30 Minutes Intravenous  Once 02/27/24 2222 02/28/24 0016   02/27/24 2230  metroNIDAZOLE (FLAGYL) IVPB 500 mg        500 mg 100 mL/hr over 60 Minutes Intravenous  Once 02/27/24 2222 02/28/24 0100   02/27/24 2230  vancomycin (VANCOCIN) IVPB 1000 mg/200 mL premix        1,000 mg 200 mL/hr over 60 Minutes Intravenous  Once 02/27/24 2222 02/28/24 0100   02/27/24 2115  cefTRIAXone (ROCEPHIN) 2 g in sodium chloride  0.9 % 100 mL IVPB  Status:  Discontinued        2 g 200 mL/hr over 30 Minutes Intravenous Once 02/27/24 2113 02/27/24 2243       Medications: Scheduled Meds:  arformoterol   15 mcg Nebulization Q12H   aspirin   81 mg Per Tube Daily   budesonide  (PULMICORT ) nebulizer solution  0.5 mg Nebulization BID   Chlorhexidine  Gluconate Cloth  6 each Topical Daily   enoxaparin  (LOVENOX ) injection  40 mg Subcutaneous Q24H   insulin  aspart  0-15 Units Subcutaneous Q4H   insulin  glargine-yfgn  8 Units Subcutaneous Daily   methocarbamol   500 mg Per  Tube TID   mouth rinse  15 mL Mouth Rinse Q2H   oxyCODONE  10 mg Per Tube Q6H   pantoprazole (PROTONIX) IV  40 mg Intravenous Q24H   polyethylene glycol  17 g Per Tube Daily   QUEtiapine  25 mg Per Tube BID   revefenacin   175 mcg Nebulization Daily   roflumilast   500 mcg Per Tube q morning   sodium chloride  flush  10-40 mL Intracatheter Q12H   thiamine   100 mg Per Tube Daily   Continuous Infusions:  cefTRIAXone (ROCEPHIN)  IV Stopped (03/05/24 0946)   feeding supplement (OSMOLITE 1.5 CAL) 40 mL/hr at 03/05/24 1000   fentaNYL  infusion INTRAVENOUS 125 mcg/hr (03/05/24 1000)   furosemide      propofol  (DIPRIVAN ) infusion 25 mcg/kg/min (03/05/24 1000)   PRN Meds:.acetaminophen  (TYLENOL ) oral liquid 160 mg/5 mL, fentaNYL , hydrALAZINE , ipratropium-albuterol , labetalol, mouth rinse, polyethylene glycol, senna, sodium chloride  flush    Objective: Weight change: -1 kg  Intake/Output Summary (Last 24 hours) at 03/05/2024 1204 Last data filed at 03/05/2024 1000 Gross per 24 hour  Intake 2004.92 ml  Output 700  ml  Net 1304.92 ml   Blood pressure (!) 143/35, pulse 68, temperature 99 F (37.2 C), resp. rate 20, height 5' 4.5 (1.638 m), weight 104 kg, SpO2 94%. Temp:  [98.2 F (36.8 C)-100.4 F (38 C)] 99 F (37.2 C) (10/20 1030) Pulse Rate:  [61-100] 68 (10/20 1030) Resp:  [13-20] 20 (10/20 1030) BP: (132-185)/(34-55) 143/35 (10/20 1110) SpO2:  [90 %-99 %] 94 % (10/20 1030) FiO2 (%):  [40 %] 40 % (10/20 1110) Weight:  [104 kg] 104 kg (10/20 0401)  Physical Exam: Physical Exam Constitutional:      Interventions: She is intubated.  Cardiovascular:     Rate and Rhythm: Tachycardia present.     Heart sounds: No murmur heard.    No friction rub. No gallop.  Pulmonary:     Effort: She is intubated.     Breath sounds: No rhonchi.  Abdominal:     General: There is no distension.  Skin:    General: Skin is warm and dry.  Neurological:     General: No focal deficit present.      Mental Status: She is alert.      CBC:    BMET Recent Labs    03/04/24 0223 03/05/24 0300  NA 141 139  K 4.0 4.1  CL 103 101  CO2 27 29  GLUCOSE 148* 164*  BUN 26* 32*  CREATININE 1.04* 1.21*  CALCIUM  8.3* 8.1*     Liver Panel  No results for input(s): PROT, ALBUMIN, AST, ALT, ALKPHOS, BILITOT, BILIDIR, IBILI in the last 72 hours.     Sedimentation Rate No results for input(s): ESRSEDRATE in the last 72 hours. C-Reactive Protein No results for input(s): CRP in the last 72 hours.  Micro Results: Recent Results (from the past 720 hours)  Resp panel by RT-PCR (RSV, Flu A&B, Covid) Anterior Nasal Swab     Status: None   Collection Time: 02/27/24  9:13 PM   Specimen: Anterior Nasal Swab  Result Value Ref Range Status   SARS Coronavirus 2 by RT PCR NEGATIVE NEGATIVE Final   Influenza A by PCR NEGATIVE NEGATIVE Final   Influenza B by PCR NEGATIVE NEGATIVE Final    Comment: (NOTE) The Xpert Xpress SARS-CoV-2/FLU/RSV plus assay is intended as an aid in the diagnosis of influenza from Nasopharyngeal swab specimens and should not be used as a sole basis for treatment. Nasal washings and aspirates are unacceptable for Xpert Xpress SARS-CoV-2/FLU/RSV testing.  Fact Sheet for Patients: BloggerCourse.com  Fact Sheet for Healthcare Providers: SeriousBroker.it  This test is not yet approved or cleared by the [place]  FDA and has been authorized for detection and/or diagnosis of SARS-CoV-2 by FDA under an Emergency Use Authorization (EUA). This EUA will remain in effect (meaning this test can be used) for the duration of the COVID-19 declaration under Section 564(b)(1) of the Act, 21 U.S.C. section 360bbb-3(b)(1), unless the authorization is terminated or revoked.     Resp Syncytial Virus by PCR NEGATIVE NEGATIVE Final    Comment: (NOTE) Fact Sheet for  Patients: BloggerCourse.com  Fact Sheet for Healthcare Providers: SeriousBroker.it  This test is not yet approved or cleared by the [place]  FDA and has been authorized for detection and/or diagnosis of SARS-CoV-2 by FDA under an Emergency Use Authorization (EUA). This EUA will remain in effect (meaning this test can be used) for the duration of the COVID-19 declaration under Section 564(b)(1) of the Act, 21 U.S.C. section 360bbb-3(b)(1), unless the authorization is terminated or revoked.  Performed at [facility], [address] [place], [place] [address]   Blood Culture (routine x 2)     Status: Abnormal   Collection Time: 02/27/24  9:13 PM   Specimen: BLOOD  Result Value Ref Range Status   Specimen Description BLOOD SITE NOT SPECIFIED  Final   Special Requests   Final    BOTTLES DRAWN AEROBIC AND ANAEROBIC Blood Culture adequate volume   Culture  Setup Time   Final    GRAM POSITIVE COCCI IN CHAINS ANAEROBIC BOTTLE ONLY    Culture (A)  Final    STREPTOCOCCUS SALIVARIUS SUSCEPTIBILITIES PERFORMED ON PREVIOUS CULTURE WITHIN THE LAST 5 DAYS. Performed at [facility], [address], [place], [place] [address]    Report Status 03/01/2024 FINAL  Final  Blood Culture (routine x 2)     Status: Abnormal   Collection Time: 02/27/24  9:18 PM   Specimen: BLOOD LEFT ARM  Result Value Ref Range Status   Specimen Description BLOOD LEFT ARM  Final   Special Requests   Final    BOTTLES DRAWN AEROBIC AND ANAEROBIC Blood Culture adequate volume   Culture  Setup Time   Final    GRAM POSITIVE COCCI IN CHAINS IN BOTH AEROBIC AND ANAEROBIC BOTTLES CRITICAL RESULT CALLED TO, READ BACK BY AND VERIFIED WITH: PHARMD K. HAMMONS [id number] @ 1542 FH Performed at [facility], [address] [place], [place] [address]    Culture STREPTOCOCCUS SALIVARIUS (A)  Final   Report Status 03/01/2024 FINAL  Final   Organism ID,  Bacteria STREPTOCOCCUS SALIVARIUS  Final      Susceptibility   Streptococcus salivarius - MIC*    PENICILLIN  0.12 SENSITIVE Sensitive     CEFTRIAXONE <=0.12 SENSITIVE Sensitive     ERYTHROMYCIN 2 RESISTANT Resistant     LEVOFLOXACIN  2 SENSITIVE Sensitive     VANCOMYCIN 1 SENSITIVE Sensitive     * STREPTOCOCCUS SALIVARIUS  Blood Culture ID Panel (Reflexed)     Status: Abnormal   Collection Time: 02/27/24  9:18 PM  Result Value Ref Range Status   Enterococcus faecalis NOT DETECTED NOT DETECTED Final   Enterococcus Faecium NOT DETECTED NOT DETECTED Final   Listeria monocytogenes NOT DETECTED NOT DETECTED Final   Staphylococcus species NOT DETECTED NOT DETECTED Final   Staphylococcus aureus (BCID) NOT DETECTED NOT DETECTED Final   Staphylococcus epidermidis NOT DETECTED NOT DETECTED Final   Staphylococcus lugdunensis NOT DETECTED NOT DETECTED Final   Streptococcus species DETECTED (A) NOT DETECTED Final    Comment: Not Enterococcus species, Streptococcus agalactiae, Streptococcus pyogenes, or Streptococcus pneumoniae. CRITICAL RESULT CALLED TO, READ BACK BY AND VERIFIED WITH: PHARMD K. HAMMONS [id number] @ 1542 FH    Streptococcus agalactiae NOT DETECTED NOT DETECTED Final   Streptococcus pneumoniae NOT DETECTED NOT DETECTED Final   Streptococcus pyogenes NOT DETECTED NOT DETECTED Final   A.calcoaceticus-baumannii NOT DETECTED NOT DETECTED Final   Bacteroides fragilis NOT DETECTED NOT DETECTED Final   Enterobacterales NOT DETECTED NOT DETECTED Final   Enterobacter cloacae complex NOT DETECTED NOT DETECTED Final   Escherichia coli NOT DETECTED NOT DETECTED Final   Klebsiella aerogenes NOT DETECTED NOT DETECTED Final   Klebsiella oxytoca NOT DETECTED NOT DETECTED Final   Klebsiella pneumoniae NOT DETECTED NOT DETECTED Final   Proteus species NOT DETECTED NOT DETECTED Final   Salmonella species NOT DETECTED NOT DETECTED Final   Serratia marcescens NOT DETECTED NOT DETECTED Final    Haemophilus influenzae NOT DETECTED NOT DETECTED  Final   Neisseria meningitidis NOT DETECTED NOT DETECTED Final   Pseudomonas aeruginosa NOT DETECTED NOT DETECTED Final   Stenotrophomonas maltophilia NOT DETECTED NOT DETECTED Final   Candida albicans NOT DETECTED NOT DETECTED Final   Candida auris NOT DETECTED NOT DETECTED Final   Candida glabrata NOT DETECTED NOT DETECTED Final   Candida krusei NOT DETECTED NOT DETECTED Final   Candida parapsilosis NOT DETECTED NOT DETECTED Final   Candida tropicalis NOT DETECTED NOT DETECTED Final   Cryptococcus neoformans/gattii NOT DETECTED NOT DETECTED Final    Comment: Performed at [facility], [address], [place], [place] [address]  MRSA Next Gen by PCR, Nasal     Status: None   Collection Time: 02/28/24  5:41 AM   Specimen: Nasal Mucosa; Nasal Swab  Result Value Ref Range Status   MRSA by PCR Next Gen NOT DETECTED NOT DETECTED Final    Comment: (NOTE) The GeneXpert MRSA Assay (FDA approved for NASAL specimens only), is one component of a comprehensive MRSA colonization surveillance program. It is not intended to diagnose MRSA infection nor to guide or monitor treatment for MRSA infections. Test performance is not FDA approved in patients less than 5 years old. Performed at [facility], [address], [place], [place] [address]   CSF culture w Gram Stain     Status: None   Collection Time: 02/29/24  3:39 PM   Specimen: CSF; Cerebrospinal Fluid  Result Value Ref Range Status   Specimen Description CSF  Final   Special Requests NONE  Final   Gram Stain   Final    WBC PRESENT,BOTH PMN AND MONONUCLEAR NO ORGANISMS SEEN CYTOSPIN SMEAR    Culture   Final    NO GROWTH 3 DAYS Performed at [facility], [address] [place], [place] [address]    Report Status 03/03/2024 FINAL  Final  VZV PCR, CSF     Status: None   Collection Time: 02/29/24  3:39 PM   Specimen: Cerebrospinal Fluid  Result Value Ref Range Status    VZV PCR, CSF Negative Negative Final    Comment: (NOTE) No Varicella Zoster Virus DNA detected. Performed At: [facility] [address] [place], [place] [id number] Jennette Shorter MD Ey:[id number]   Culture, blood (Routine X 2) w Reflex to ID Panel     Status: None   Collection Time: 02/29/24  4:00 PM   Specimen: BLOOD RIGHT FOREARM  Result Value Ref Range Status   Specimen Description BLOOD RIGHT FOREARM  Final   Special Requests   Final    BOTTLES DRAWN AEROBIC ONLY Blood Culture adequate volume   Culture   Final    NO GROWTH 5 DAYS Performed at [facility], [address], [place], [place] [address]    Report Status 03/05/2024 FINAL  Final  Culture, blood (Routine X 2) w Reflex to ID Panel     Status: None   Collection Time: 02/29/24  4:00 PM   Specimen: BLOOD  Result Value Ref Range Status   Specimen Description BLOOD RIGHT ANTECUBITAL  Final   Special Requests   Final    BOTTLES DRAWN AEROBIC AND ANAEROBIC Blood Culture adequate volume   Culture   Final    NO GROWTH 5 DAYS Performed at [facility], [address], [place], [place] [address]    Report Status 03/05/2024 FINAL  Final    Studies/Results: DG CHEST PORT 1 VIEW Result Date: 03/05/2024 CLINICAL  DATA:  5626 Acute respiratory failure (HCC) 5626 [id number] History of ETT [id number] EXAM: PORTABLE CHEST - 1 VIEW COMPARISON:  03/02/2024 FINDINGS: Endotracheal tube terminates in the mid trachea. Weighted feeding tube courses below the diaphragm with the distal tip not included in the field of view. Right PICC terminates in the lower SVC. Lower lung volumes. Interstitial opacities throughout both lungs. Small bilateral pleural effusions. No pneumothorax. No cardiomegaly. IMPRESSION: 1. Interstitial opacities in both lungs are more prominent, possibly due to mild interstitial edema. Small bilateral pleural effusions are also present with bibasilar atelectasis. 2. Right PICC has been placed in the interim,  terminating in the lower SVC. Similarly positioned endotracheal and weighted feeding tubes. Electronically Signed   By: Rogelia Myers M.D.   On: 03/05/2024 10:31   DG Abd Portable 1V Result Date: 03/04/2024 CLINICAL DATA:  Abdominal distension. EXAM: PORTABLE ABDOMEN - 1 VIEW COMPARISON:  Abdominal radiograph dated 02/28/2024 FINDINGS: Enteric tube with tip in the mid abdomen, likely in the distal stomach. Diffusely dilated bowel loops again noted. No free air. Osteopenia with degenerative changes of spine. Small bilateral pleural effusions and bibasilar atelectasis or infiltrate. IMPRESSION: 1. Enteric tube with tip in the distal stomach. 2. Diffusely dilated bowel loops. Electronically Signed   By: Vanetta Chou M.D.   On: 03/04/2024 18:44   VAS US  UPPER EXTREMITY VENOUS DUPLEX Result Date: 03/04/2024 UPPER VENOUS STUDY  Patient Name:  Victoria [name]  Date of Exam:   03/03/2024 Medical Rec #: [id number]             Accession #:    [id number] Date of Birth: Apr 25, 1946             Patient Gender: F Patient Age:   78 years Exam Location:  [facility] Procedure:      VAS US  UPPER EXTREMITY VENOUS DUPLEX Referring Phys: PAULA SOUTHERLY --------------------------------------------------------------------------------  Indications: Edema Limitations: Edema, ventilation and poor ultrasound/tissue interface. Comparison Study: No prior UEV on file Performing Technologist: Alberta Lis RVS  Examination Guidelines: A complete evaluation includes B-mode imaging, spectral Doppler, color Doppler, and power Doppler as needed of all accessible portions of each vessel. Bilateral testing is considered an integral part of a complete examination. Limited examinations for reoccurring indications may be performed as noted.  Right Findings: +----------+------------+---------+-----------+----------+-------+ RIGHT     CompressiblePhasicitySpontaneousPropertiesSummary  +----------+------------+---------+-----------+----------+-------+ Subclavian               Yes       Yes                      +----------+------------+---------+-----------+----------+-------+  Left Findings: +----------+------------+---------+-----------+----------+-----------------+ LEFT      CompressiblePhasicitySpontaneousProperties     Summary      +----------+------------+---------+-----------+----------+-----------------+ IJV                      Yes       Yes                                +----------+------------+---------+-----------+----------+-----------------+ Subclavian               Yes       Yes                                +----------+------------+---------+-----------+----------+-----------------+ Axillary      Full  Yes       Yes                                +----------+------------+---------+-----------+----------+-----------------+ Brachial      Full       Yes       Yes                                +----------+------------+---------+-----------+----------+-----------------+ Radial        Full       Yes       Yes                                +----------+------------+---------+-----------+----------+-----------------+ Ulnar                                                patent by color  +----------+------------+---------+-----------+----------+-----------------+ Cephalic      None                                  Age Indeterminate +----------+------------+---------+-----------+----------+-----------------+ Basilic       Full                                                    +----------+------------+---------+-----------+----------+-----------------+  Summary:  Right: No evidence of thrombosis in the subclavian.  Left: No evidence of deep vein thrombosis in the upper extremity. Findings consistent with age indeterminate superficial vein thrombosis involving the left cephalic vein.  *See table(s) above for  measurements and observations.  Diagnosing physician: Lonni Gaskins MD Electronically signed by Lonni Gaskins MD on 03/04/2024 at 10:33:07 AM.    Final       Assessment/Plan:  INTERVAL HISTORY: clarifying PCN allergy--with her having received augmentin  and zosyn recently this is unlikely a true allergy   Principal Problem:   Endocarditis Active Problems:   Altered mental status   Cerebrovascular accident (CVA) due to embolism of cerebral artery (HCC)   Bacteremia due to Streptococcus   Streptococcal bacteremia   Acute bacterial endocarditis   Pacemaker infection    Victoria [name] is a 78 y.o. 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  She has a penicillin  allergy listed that apparently family informed staff of which sounds to be remote and is without a description.  End of note she has received several penicillins in recent years including Augmentin  and Zosyn.  #1 presumptive native valve endocarditis with septic embolization to the brain  I would prefer to switch her over to penicillin  which we can give for continuous infusion every 12 hours in the hospital and which could be given as a 24-hour continuous infusion.  It would be more specific for organism and have excellent CNS penetration.  We would plan on treating her for 4 weeks.  We are going to touch base with family first prior to making a switch straight up to penicillin   #2 PCN allergy: see discussion  above  #3 standard universal precautions  I have personally spent 50 minutes involved in face-to-face and non-face-to-face activities for this patient on the day of the visit. Professional time spent includes the following activities: Preparing to see the patient (review of tests), Obtaining and/or reviewing separately obtained history (admission/discharge record), Performing a medically appropriate examination and/or evaluation ,  Ordering medications/tests/procedures, referring and communicating with other health care professionals, Documenting clinical information in the EMR, Independently interpreting results (not separately reported), Communicating results to the patient/family/caregiver, Counseling and educating the patient/family/caregiver and Care coordination (not separately reported).   Evaluation of the patient requires complex antimicrobial therapy evaluation, counseling , isolation needs to reduce disease transmission and risk assessment and mitigation.      LOS: 6 days   Victoria [name] 03/05/2024, 12:04 PM

## 2024-03-05 NOTE — Progress Notes (Signed)
 Nutrition Follow-up  DOCUMENTATION CODES:  Obesity unspecified  INTERVENTION:  TF via Cortrak: Osmolite 1.5 at 40 mL/hr (960 ml per day) Add 60ml ProSource TF20 once daily Provides 1520 kcal, 80 gm protein, 731 ml free water daily  NUTRITION DIAGNOSIS:  Inadequate oral intake related to inability to eat as evidenced by NPO status. - remains applicable  GOAL:  Patient will meet greater than or equal to 90% of their needs - goal met via TF  MONITOR:  Labs, Vent status, TF tolerance  REASON FOR ASSESSMENT:  Consult Enteral/tube feeding initiation and management  ASSESSMENT:  Pt admitted after being found down. Found to have multiple embolic strokes and strep bacteremia. PMH significant for COPD, HTN, HLD, T2DM, CKD stage IIIa.  10/14: admitted to ICU 10/15: MRI- right MCA territory stroke and other small strokes, embolic appearing 10/17: TEE-normal LV and RV function, no evidence of endocarditis  10/19: abd xray- diffusely dilated bowel loops  Patient is currently intubated on ventilator support MV: 5.7 L/min Temp (24hrs), Avg:99.3 F (37.4 C), Min:98.2 F (36.8 C), Max:100.4 F (38 C)  Pt discussed in interdisciplinary rounds.  Plan to diurese and then attempt wake up assessment.  Unable to vent wean today d/t apnea.  Of note, despite TEE negative for endocarditis, pt receiving treatment d/t high suspicion for clinical endocarditis.   Pt continues on tube feeds at goal rate via Cortrak. Abdomen remains distended and somewhat taut. RN present at bedside. Reports pt had a BM yesterday.  None yet today, given bowel regimen.   UOP: x24 hours  Admit weight: 84.4 kg? Current weight: 104 kg + moderate pitting generalized, BUE, BLE edema  Medications: SSI 0-15 units q4h, semglee 8 units daily, miralax daily, thiamine , IV abx, propofol  @ 14.5ml/hr   Labs:  BUN 32 Cr 1.21 GFR 45 CBG's 152-192 x24 hours  Diet Order:   Diet Order             Diet NPO time  specified  Diet effective midnight                   EDUCATION NEEDS:  Not appropriate for education at this time  Skin:  Skin Assessment: Reviewed RN Assessment  Last BM:  10/19 type 6 medium  Height:  Ht Readings from Last 1 Encounters:  02/28/24 5' 4.5 (1.638 m)    Weight:  Wt Readings from Last 1 Encounters:  03/05/24 104 kg    Ideal Body Weight:  55.7 kg  BMI:  Body mass index is 38.75 kg/m.  Estimated Nutritional Needs:   Kcal:  1400-1600  Protein:  75-90g  Fluid:  >/=1.5L  Royce Maris, RDN, LDN Clinical Nutrition See AMiON for contact information.

## 2024-03-05 NOTE — Progress Notes (Incomplete)
 PHARMACY CONSULT NOTE FOR:  OUTPATIENT  PARENTERAL ANTIBIOTIC THERAPY (OPAT)  Indication: Strep salivarius endocarditis Regimen: Penicillin  18 million units daily as a continuous infusion End date: 03/27/24  IV antibiotic discharge orders are pended. To discharging provider:  please sign these orders via discharge navigator,  Select New Orders & click on the button choice - Manage This Unsigned Work.     Thank you for allowing pharmacy to be a part of this patient's care.  Victoria [name], PharmD, BCPS, BCIDP Infectious Diseases Clinical Pharmacist 03/06/2024 12:47 PM   **Pharmacist phone directory can now be found on [email or web address] ([email or web address]).  Listed under [facility].

## 2024-03-05 NOTE — Evaluation (Signed)
 RT Evaluate and Treat Note  03/05/2024   Breathing is (select one): Same as normal    The following was found on auscultation (select multiple):  Bilateral Breath Sounds: Rhonchi (03/05/24 0800)  R Upper  Breath Sounds: Rhonchi (03/05/24 0800) L Upper Breath Sounds: Rhonchi (03/05/24 0800) R Lower Breath Sounds: Diminished (03/05/24 0800) L Lower Breath Sounds: Diminished (03/05/24 0800)    Cough Assessment: Cough: Weak (03/05/24 0800)    Most Recent Chest Xray:... (No results found.    The following medications and/or interventions were ordered/changed/discontinued as part of the Respiratory Treatment protocol:   Medication Changes: no changes   Airway Clearance Changes: no changes   Oxygen  Therapy Changes: no changes

## 2024-03-05 NOTE — Progress Notes (Signed)
 RT NOTE: attempted SBT on patient on CPAP/PSV of 12/5 at 0744.  After 5 minutes, patient's RR began to decrease to 6.  Placed patient back on full support ventilator settings and is tolerating well at this time.  Will continue to monitor.

## 2024-03-05 NOTE — Progress Notes (Addendum)
 NAME:  Victoria [name], MRN:  [id number], DOB:  Sep 17, 1945, LOS: 6 ADMISSION DATE:  02/27/2024, CONSULTATION DATE:  02/28/24 REFERRING MD: Haze, CHIEF COMPLAINT:  Acute respiratory failure    History of Present Illness:  Patient is an 78 yo F w/ pertinent PMH COPD on 2L [place] at home, OSA, HTN, HLD, T2DM, CKD 3a presents to [facility] on 10/14 w/ AMS found to have multiple embolic strokes and strep bacteremia, requiring invasive mechanical ventilation.    [name] LKN 10/10. Patient found on 10/13 on floor confused by EMS, brought to [facility] ED. Patient arrived to ED meeting SIRS criteria, became lethargic with increased respiratory distress requiring intubation.    Patient remains intubated, on fentanyl  and propofol  drip, no longer on pressors. Though no source identified on TEE, treating for presumed infective endocarditis.   Lines: Intubated, PICC, Cortrak, urinary catheter   Pertinent  Medical History   Past Medical History:  Diagnosis Date   Abdominal aneurysm    Dr. Harvey follows lLOV 2 ''17 per pt around 2 cm   Anemia    as a child   Arthritis    left ankle, right knee, right SI joint, wrists, lower back   Breast cancer in female [facility])    Left   COPD (chronic obstructive pulmonary disease) (HCC)    ephysema-Dr. 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 Eletha in [place]   Sleep apnea    cpap used sometimes, uses oxygen  concentrator 2 l/m nasally bedtime   Varicella as child    Significant Hospital Events: Including procedures, antibiotic start and stop dates in addition to  other pertinent events   10/14: Admitted to ICU requiring IMV 10/17: No embolic source seen on TEE. Treating for presumed infective endocarditis.  10/18: Failing PSV weans with agitation. Transient episodes of bradycardia.   Interim History / Subjective:  ON: Afebrile, BP 140-160/30-40 with MAP>65, HR 60-80, satting well on ventilator (420 TV, PEEP 5, FiO2 40, RR 20)   I/O UOP 1.1 in last 24 Total +12 L since admission   Labs WBC 9.4>9.1 Hgb 9.7 > 9.0 Cr 1.04 > 1.21 GFR 54 > 45   Initial blood cx 10/13 with strep in 3/4 bottles Repeat blood cx 10/15: NG 5d  DG Abdomen 10/19: Diffusely dilated bowel loops   Objective    Blood pressure (!) 152/42, pulse 70, temperature 98.2 F (36.8 C), resp. rate 17, height 5' 4.5 (1.638 m), weight 104 kg, SpO2 95%.    Vent Mode: PRVC FiO2 (%):  [40 %] 40 % Set Rate:  [20 bmp] 20 bmp Vt Set:  [420 mL] 420 mL PEEP:  [5 cmH20] 5 cmH20 Plateau Pressure:  [15 cmH20-25 cmH20] 18 cmH20   Intake/Output Summary (Last 24 hours) at 03/05/2024 0714 Last data filed at 03/05/2024 0600 Gross per 24 hour  Intake 2053.46 ml  Output 1225 ml  Net 828.46 ml   Filed Weights   03/03/24 0500 03/04/24 0500 03/05/24 0401  Weight: 103 kg 105 kg 104 kg    Examination: General: Sedated Lungs: Intubated. CTAB. Equal chest rise and fall. No respiratory distress.  Cardiovascular: S1 more prominent than S2. No extra heart sounds. Normal distal pulses.  Abdomen: Distended. Soft.  Extremities: Edematous upper and lower extremities bilaterally.  Neuro: Sedated. Non-responsive to pain.   Resolved problem list   Assessment and Plan   #Acute metabolic encephalopathy #Strep bacteremia  TTE and TEE without source of embolism identified. Brain MRA normal. CSF non-infectious. S/p broad spectrum abx, now on CTX. Plan for treatment of presumptive infective endocarditis per ID.  - Repeat blood cx 10/15: NG 5d - Cont CTX 2g BID per ID - Cont Seroquel 25 nightly   - Cont Cortrak continuous feeds - at goal of 61ml/hr  #AHRF #Hx severe COPD +12 L since admission. Fluid overloaded on exam. S/p 160 mg IV Lasix  x1 yesterday with ~1L output and small rise in Cr.  - Lasix  160 IV x1 with Metolazone 5mg  x1 today  - BNP ordered  - Trend kidney function  - Cont Brovana , Pulmicort , Tupelir, Deliresp   #T2DM BG elevated to 190 overnight. Required 18u SAI in last 24.  - Add basal LAI - 8u glargine daily  - Cont Mod SSI  #PAD - Fentanyl  125 mcg/hr  - Propofol  25 mcg/kg/min  Ulcer prophylaxis: PPI VTE Prophylaxis: Lovenox    Labs   CBC: Recent Labs  Lab 02/27/24 2130 02/27/24 2341 03/01/24 0444 03/02/24 0908 03/03/24 0217 03/04/24 0223 03/05/24 0300  WBC 16.3*   < > 12.3* 10.6* 9.0 9.4 9.1  NEUTROABS 13.9*  --   --  9.0*  --   --   --   HGB 13.2   < > 10.5* 9.9* 9.7* 9.7* 9.0*  HCT 42.3   < > 33.8* 32.2* 31.8* 31.8* 30.2*  MCV 87.8   < > 89.7 89.4 90.3 90.6 92.9  PLT 240   < > 132* 165 126* 133* 189   < > = values in this interval not displayed.    Basic Metabolic Panel: Recent Labs  Lab 03/01/24 0444 03/01/24 1029 03/02/24 0444 03/02/24 9386 03/02/24 0908 03/02/24 1617 03/03/24 0217 03/04/24 0223 03/05/24 0300  NA 135  --   --    < > 138 137 139 141 139  K 4.2  --   --    < > 4.4 4.3 4.2 4.0 4.1  CL 102  --   --    < > 102 103 102 103 101  CO2 24  --   --    < > 25 25 25 27 29   GLUCOSE 121*  --   --    < > 178* 189* 166* 148* 164*  BUN 30*  --   --    < > 27* 27* 24* 26* 32*  CREATININE 1.10*  --   --    < > 0.91 0.89 0.88 1.04* 1.21*  CALCIUM  7.8*  --   --    < > 8.3* 8.4* 8.2* 8.3* 8.1*  MG 2.3 2.4 2.6*  --   --   --  2.3 2.0  --   PHOS  --  3.5 2.9  --   --   --  2.5 3.5  --    < > = values in this interval not displayed.   GFR: Estimated Creatinine Clearance: 44 mL/min (A) (by C-G formula based on SCr of 1.21 mg/dL (H)). Recent Labs  Lab 02/27/24 2127 02/27/24 2130  02/27/24 2341 02/28/24 0603 02/29/24 0320  03/01/24 0444 03/02/24 0908 03/03/24 0217 03/04/24 0223 03/05/24 0300  WBC  --    < >  --    < > 12.9*   < > 10.6* 9.0 9.4 9.1  LATICACIDVEN 4.1*  --  2.6*  --  1.2  --   --   --   --   --    < > = values in this interval not displayed.    Liver Function Tests: Recent Labs  Lab 02/27/24 2130 03/02/24 0908  AST 62* 23  ALT 43 16  ALKPHOS 91 93  BILITOT 0.8 0.3  PROT 6.9 5.8*  ALBUMIN 3.3* 2.1*   No results for input(s): LIPASE, AMYLASE in the last 168 hours. Recent Labs  Lab 02/28/24 0603  AMMONIA 29    ABG    Component Value Date/Time   HCO3 27.5 02/27/2024 2341   TCO2 29 02/27/2024 2341   O2SAT 96 02/27/2024 2341     Coagulation Profile: Recent Labs  Lab 02/27/24 2130  INR 0.9    Cardiac Enzymes: Recent Labs  Lab 02/29/24 0320 03/01/24 0444 03/02/24 0613 03/02/24 0908 03/03/24 0217  CKTOTAL 2,418* 1,638* 380* 389* 226    HbA1C: Hemoglobin A1C  Date/Time Value Ref Range Status  01/25/2023 12:50 PM 6.1 (A) 4.0 - 5.6 % Final  07/19/2022 01:20 PM 7.2 (A) 4.0 - 5.6 % Final   Hgb A1c MFr Bld  Date/Time Value Ref Range Status  02/28/2024 06:00 AM 7.0 (H) 4.8 - 5.6 % Final    Comment:    (NOTE) Diagnosis of Diabetes The following HbA1c ranges recommended by the American Diabetes Association (ADA) may be used as an aid in the diagnosis of diabetes mellitus.  Hemoglobin             Suggested A1C NGSP%              Diagnosis  <5.7                   Non Diabetic  5.7-6.4                Pre-Diabetic  >6.4                   Diabetic  <7.0                   Glycemic control for                       adults with diabetes.    01/25/2021 12:08 PM 6.3 (H) 4.8 - 5.6 % Final    Comment:    (NOTE) Pre diabetes:          5.7%-6.4%  Diabetes:              >6.4%  Glycemic control for   <7.0% adults with diabetes     CBG: Recent Labs  Lab 03/04/24 0340 03/04/24 0726 03/04/24 1120 03/04/24 1512 03/04/24 2317  GLUCAP 150* 166* 176*  152* 192*    Past Medical History:  She,  has a past medical history of Abdominal aneurysm, Anemia, Arthritis, Breast cancer in female [facility]), COPD (chronic obstructive pulmonary disease) (HCC), Dyspnea, Headache, Heart murmur, Hyperlipidemia, Hypertension, Multiple thyroid  nodules, Murmur, cardiac (1950), Osteoporosis, Pneumonia, Pre-diabetes, Requires continuous at home supplemental oxygen , Sebaceous cyst, Sleep apnea, and Varicella (as child).   Surgical History:   Past Surgical History:  Procedure Laterality Date   APPENDECTOMY  2008   BREAST LUMPECTOMY Left 08/31/2017   BREAST LUMPECTOMY WITH RADIOACTIVE SEED LOCALIZATION Left 08/10/2017   Procedure: BREAST LUMPECTOMY WITH RADIOACTIVE SEED LOCALIZATION;  Surgeon: Ebbie Cough, MD;  Location: [facility] OR;  Service: General;  Laterality: Left;   CESAREAN SECTION     1972   COLONOSCOPY     COLONOSCOPY WITH PROPOFOL  N/A 04/16/2016   Procedure: COLONOSCOPY WITH PROPOFOL ;  Surgeon: Belvie Just, MD;  Location: [facility]COPY;  Service: Endoscopy;  Laterality: N/A;   CYST REMOVAL NECK Left 04/13/2016   Procedure: EXCISION OF SEBACEOUS CYST LEFT POSTERIOR NECK;  Surgeon: Krystal Spinner, MD;  Location: [facility] OR;  Service: General;  Laterality: Left;   Excision of Pelvic Absess, Right Ovary     2008   RE-EXCISION OF BREAST CANCER,SUPERIOR MARGINS Left 08/31/2017   Procedure: RE-EXCISION OF LEFT  BREAST MARGINS ERAS PATHWAY;  Surgeon: Ebbie Cough, MD;  Location: [facility] OR;  Service: General;  Laterality: Left;   TUBAL LIGATION  1980     Social History:   reports that she quit smoking about 12 years ago. Her smoking use included cigarettes. She started smoking about 62 years ago. She has a 50 pack-year smoking history. She has never used smokeless tobacco. She reports current alcohol use. She reports that she does not use drugs.   Family History:  Her family history includes Alzheimer's disease in her brother and father; Breast cancer in her  paternal grandmother; Diabetes in her brother; Heart disease in her mother; Hyperlipidemia in her brother and brother; Hypertension in her brother, brother, and mother; Stroke (age of onset: 19) in her father.   Allergies Allergies  Allergen Reactions   Benicar  [Olmesartan ] Swelling    Swelling of face and arms    [name]  [Valsartan ] Swelling    Swelling of face and arms    Hydrocodone -Acetaminophen  Nausea And Vomiting    Severe vomiting   Lisinopril  Cough   Monosodium Glutamate Other (See Comments)    Facial swelling per pt   Codeine Other (See Comments)    jittery   Lead Acetate Rash   Nickel Rash    Severe rash to infection: pt is allergic to all metals other than sterling silver or gold jewelry.    Penicillins Other (See Comments)    Unknown reaction per family at the bedside     Home Medications  Prior to Admission medications   Medication Sig Start Date End Date Taking? Authorizing Provider  Accu-Chek Softclix Lancets lancets Use as instructed to check blood sugar 1x daily 03/30/23   Shamleffer, Ibtehal Jaralla, MD  acetaminophen  (TYLENOL ) 500 MG tablet Take 1,000 mg by mouth every 6 (six) hours as needed.    [provider]  albuterol  (PROVENTIL ) (2.5 MG/3ML) 0.083% nebulizer solution Take 3 mLs (2.5 mg total) by nebulization every 6 (six) hours as needed for wheezing or shortness of breath. 12/31/21   Parrett, Madelin RAMAN, NP  Albuterol  Sulfate (PROAIR  RESPICLICK) 108 (90 Base) MCG/ACT AEPB Inhale 1-2 puffs into the lungs every 6 (six) hours as needed (for wheezing/shortness of breath). 03/31/20   Geronimo Amel, MD  amLODipine  (NORVASC ) 10 MG tablet TAKE ONE TABLET BY MOUTH ONCE DAILY 11/24/22   Burnard Debby LABOR, MD  aspirin  EC 81 MG tablet Take 1 tablet (81 mg total) by mouth daily. 07/23/19   Kroeger, Bruno HERO., PA-C  Blood Glucose Monitoring Suppl (ACCU-CHEK AVIVA PLUS) w/Device KIT Use to monitor glucose levels once per day; E11.9 06/10/23   Shamleffer, Donell Cardinal,  MD  carvedilol  (COREG ) 6.25 MG tablet TAKE ONE TABLET BY MOUTH EVERY MORNING and TAKE ONE TABLET BY MOUTH EVERYDAY AT BEDTIME 11/24/22   Burnard Debby LABOR, MD  cetirizine  (ZYRTEC ) 10 MG tablet Take 10 mg by mouth every morning. 12/28/23   [provider]  Cholecalciferol (VITAMIN D ) 50 MCG (2000 UT) tablet Take 2,000 Units by mouth daily. 02/07/24   [provider]  Cholecalciferol (VITAMIN D3) 125 MCG (5000 UT) CAPS TAKE ONE CAPSULE BY MOUTH ONCE DAILY 12/29/22   Joshua Debby CROME, MD  dapagliflozin  propanediol (FARXIGA ) 10 MG TABS tablet Take 1 tablet (10 mg total) by mouth daily. 09/20/23   Shamleffer, Ibtehal Jaralla, MD  denosumab  (PROLIA ) 60 MG/ML SOSY injection Inject 60 mg into the skin every 6 (six) months. 06/16/22   Joshua Debby CROME, MD  FEROSUL 325 (65 Fe) MG tablet Take 325 mg by mouth 3 (three) times a week. 08/09/23   [provider]  Fluticasone -Umeclidin-Vilant (TRELEGY ELLIPTA ) 100-62.5-25 MCG/ACT AEPB INHALE ONE (1) PUFF BY MOUTH AND INTO THE LUNGS DAILY 12/12/23   Geronimo Amel, MD  glucose blood (ACCU-CHEK AVIVA PLUS) test strip USE TO Check blood glucose 1x DAILY 06/03/23   Shamleffer, Ibtehal Jaralla, MD  glucose blood (ACCU-CHEK GUIDE) test strip USE 1 STRIP ONCE DAILY 04/09/20   Kassie Mallick, MD  glucose blood test strip USE 1 STRIP ONCE DAILY 02/29/20   Kassie Mallick, MD  methocarbamol  (ROBAXIN ) 500 MG tablet Take 1 tablet (500 mg total) by mouth every 8 (eight) hours as needed for muscle spasms. 08/19/22   Elnor Lauraine BRAVO, NP  OXYGEN  Inhale 2-3 L into the lungs continuous. When exerting self    [provider]  Potassium Chloride  ER 20 MEQ TBCR Take 1 tablet by mouth every morning. 01/07/23   [provider]  roflumilast  (DALIRESP ) 500 MCG TABS tablet TAKE ONE TABLET BY MOUTH EVERY MORNING 11/25/22   Geronimo Amel, MD  rosuvastatin  (CRESTOR ) 40 MG tablet TAKE ONE TABLET BY MOUTH EVERYDAY AT BEDTIME 11/24/22   Burnard Debby LABOR, MD   SitaGLIPtin -MetFORMIN  HCl (JANUMET  XR) 50-1000 MG TB24 Take 1 tablet by mouth daily before supper. 09/20/23   Shamleffer, Ibtehal Jaralla, MD  spironolactone  (ALDACTONE ) 25 MG tablet TAKE ONE TABLET BY MOUTH ONCE DAILY 11/24/22   Burnard Debby LABOR, MD  thiamine  (VITAMIN B1) 100 MG tablet TAKE 1 TABLET BY MOUTH EVERY OTHER DAY 01/20/23   Joshua Debby CROME, MD

## 2024-03-05 NOTE — Care Plan (Addendum)
 Spoke with patient's son at bedside. Discussed patient updates and care plan. Per son, patient with a severe reaction to penicillin  with difficulty braething and discontinuation of the medication, occurring some time in the last 5-10 years.   Per chart review, patient has received multiple courses of PCN products in the  past 15 years including Augmentin  and Zosyn without documented reaction.   Addendum 1346: Spoke with patient's son at bedside, along with other family member over the phone, about patient trial of amoxicillin . Discussed low suspicion for true penicillin  allergy based on patient history. [name] son expressed understanding and is agreeable to plan.   Addendum 1620: Spoke with son to update that the amoxicillin  trial dose had been given, without signs of allergic reaction thus far. Patient expressed gratitude for the update.

## 2024-03-06 DIAGNOSIS — Z880 Allergy status to penicillin: Secondary | ICD-10-CM | POA: Diagnosis not present

## 2024-03-06 DIAGNOSIS — B955 Unspecified streptococcus as the cause of diseases classified elsewhere: Secondary | ICD-10-CM | POA: Diagnosis not present

## 2024-03-06 DIAGNOSIS — E119 Type 2 diabetes mellitus without complications: Secondary | ICD-10-CM | POA: Diagnosis not present

## 2024-03-06 DIAGNOSIS — R7881 Bacteremia: Secondary | ICD-10-CM | POA: Diagnosis not present

## 2024-03-06 DIAGNOSIS — J9601 Acute respiratory failure with hypoxia: Secondary | ICD-10-CM | POA: Diagnosis not present

## 2024-03-06 DIAGNOSIS — I330 Acute and subacute infective endocarditis: Secondary | ICD-10-CM | POA: Diagnosis not present

## 2024-03-06 DIAGNOSIS — I76 Septic arterial embolism: Secondary | ICD-10-CM | POA: Diagnosis not present

## 2024-03-06 DIAGNOSIS — B950 Streptococcus, group A, as the cause of diseases classified elsewhere: Secondary | ICD-10-CM | POA: Diagnosis not present

## 2024-03-06 LAB — CBC
HCT: 30.4 % — ABNORMAL LOW (ref 36.0–46.0)
Hemoglobin: 9 g/dL — ABNORMAL LOW (ref 12.0–15.0)
MCH: 27.1 pg (ref 26.0–34.0)
MCHC: 29.6 g/dL — ABNORMAL LOW (ref 30.0–36.0)
MCV: 91.6 fL (ref 80.0–100.0)
Platelets: 223 K/uL (ref 150–400)
RBC: 3.32 MIL/uL — ABNORMAL LOW (ref 3.87–5.11)
RDW: 15.9 % — ABNORMAL HIGH (ref 11.5–15.5)
WBC: 10.2 K/uL (ref 4.0–10.5)
nRBC: 0 % (ref 0.0–0.2)

## 2024-03-06 LAB — BASIC METABOLIC PANEL WITH GFR
Anion gap: 9 (ref 5–15)
Anion gap: 12 (ref 5–15)
BUN: 41 mg/dL — ABNORMAL HIGH (ref 8–23)
BUN: 46 mg/dL — ABNORMAL HIGH (ref 8–23)
CO2: 33 mmol/L — ABNORMAL HIGH (ref 22–32)
CO2: 34 mmol/L — ABNORMAL HIGH (ref 22–32)
Calcium: 8.7 mg/dL — ABNORMAL LOW (ref 8.9–10.3)
Calcium: 9.2 mg/dL (ref 8.9–10.3)
Chloride: 100 mmol/L (ref 98–111)
Chloride: 98 mmol/L (ref 98–111)
Creatinine, Ser: 1.25 mg/dL — ABNORMAL HIGH (ref 0.44–1.00)
Creatinine, Ser: 1.21 mg/dL — ABNORMAL HIGH (ref 0.44–1.00)
GFR, Estimated: 44 mL/min — ABNORMAL LOW (ref >60)
GFR, Estimated: 45 mL/min — ABNORMAL LOW (ref >60)
Glucose, Bld: 202 mg/dL — ABNORMAL HIGH (ref 70–99)
Glucose, Bld: 242 mg/dL — ABNORMAL HIGH (ref 70–99)
Potassium: 3.6 mmol/L (ref 3.5–5.1)
Potassium: 3.5 mmol/L (ref 3.5–5.1)
Sodium: 142 mmol/L (ref 135–145)
Sodium: 144 mmol/L (ref 135–145)

## 2024-03-06 LAB — GLUCOSE, CAPILLARY
Glucose-Capillary: 169 mg/dL — ABNORMAL HIGH (ref 70–99)
Glucose-Capillary: 196 mg/dL — ABNORMAL HIGH (ref 70–99)
Glucose-Capillary: 196 mg/dL — ABNORMAL HIGH (ref 70–99)
Glucose-Capillary: 214 mg/dL — ABNORMAL HIGH (ref 70–99)
Glucose-Capillary: 223 mg/dL — ABNORMAL HIGH (ref 70–99)
Glucose-Capillary: 223 mg/dL — ABNORMAL HIGH (ref 70–99)
Glucose-Capillary: 236 mg/dL — ABNORMAL HIGH (ref 70–99)

## 2024-03-06 LAB — TRIGLYCERIDES: Triglycerides: 139 mg/dL (ref <150)

## 2024-03-06 LAB — BRAIN NATRIURETIC PEPTIDE: B Natriuretic Peptide: 315.9 pg/mL — ABNORMAL HIGH (ref 0.0–100.0)

## 2024-03-06 LAB — MAGNESIUM: Magnesium: 2.1 mg/dL (ref 1.7–2.4)

## 2024-03-06 MED ORDER — FUROSEMIDE 10 MG/ML IJ SOLN
160.0000 mg | Freq: Four times a day (QID) | INTRAVENOUS | Status: AC
  Administered 2024-03-06 (×2): 160 mg via INTRAVENOUS
  Filled 2024-03-06: qty 10
  Filled 2024-03-06: qty 2

## 2024-03-06 MED ORDER — METOLAZONE 5 MG PO TABS
5.0000 mg | ORAL_TABLET | Freq: Once | ORAL | Status: AC
  Administered 2024-03-06: 5 mg
  Filled 2024-03-06: qty 1

## 2024-03-06 MED ORDER — PROPOFOL 1000 MG/100ML IV EMUL
0.0000 ug/kg/min | INTRAVENOUS | Status: AC
  Administered 2024-03-06: 35 ug/kg/min via INTRAVENOUS
  Administered 2024-03-06: 25 ug/kg/min via INTRAVENOUS
  Administered 2024-03-07 (×2): 30 ug/kg/min via INTRAVENOUS
  Administered 2024-03-07 (×2): 40 ug/kg/min via INTRAVENOUS
  Administered 2024-03-07 – 2024-03-08 (×5): 30 ug/kg/min via INTRAVENOUS
  Administered 2024-03-08: 35 ug/kg/min via INTRAVENOUS
  Administered 2024-03-09: 40 ug/kg/min via INTRAVENOUS
  Administered 2024-03-09: 10 ug/kg/min via INTRAVENOUS
  Administered 2024-03-09 – 2024-03-10 (×2): 20 ug/kg/min via INTRAVENOUS
  Filled 2024-03-06 (×16): qty 100

## 2024-03-06 MED ORDER — POTASSIUM CHLORIDE 20 MEQ PO PACK
40.0000 meq | PACK | Freq: Once | ORAL | Status: AC
  Administered 2024-03-06: 40 meq
  Filled 2024-03-06: qty 2

## 2024-03-06 MED ORDER — INSULIN ASPART 100 UNIT/ML IJ SOLN
2.0000 [IU] | INTRAMUSCULAR | Status: DC
  Administered 2024-03-06 – 2024-03-07 (×6): 2 [IU] via SUBCUTANEOUS

## 2024-03-06 MED ORDER — PENICILLIN G POTASSIUM 20000000 UNITS IJ SOLR
18.0000 10*6.[IU] | INTRAVENOUS | Status: DC
  Administered 2024-03-06: 18 10*6.[IU] via INTRAVENOUS
  Filled 2024-03-06 (×2): qty 18

## 2024-03-06 MED ORDER — DEXMEDETOMIDINE HCL IN NACL 400 MCG/100ML IV SOLN
0.0000 ug/kg/h | INTRAVENOUS | Status: DC
  Administered 2024-03-06: 0.4 ug/kg/h via INTRAVENOUS
  Filled 2024-03-06: qty 100

## 2024-03-06 MED ORDER — INSULIN GLARGINE-YFGN 100 UNIT/ML ~~LOC~~ SOLN
12.0000 [IU] | Freq: Every day | SUBCUTANEOUS | Status: DC
  Administered 2024-03-06: 12 [IU] via SUBCUTANEOUS
  Filled 2024-03-06 (×2): qty 0.12

## 2024-03-06 MED ORDER — POTASSIUM CHLORIDE 10 MEQ/100ML IV SOLN
10.0000 meq | INTRAVENOUS | Status: AC
  Administered 2024-03-06 (×4): 10 meq via INTRAVENOUS
  Filled 2024-03-06 (×4): qty 100

## 2024-03-06 NOTE — Progress Notes (Addendum)
 eLink Physician-Brief Progress Note Patient Name: Nandika Stetzer DOB: 16-Feb-1946 MRN: [id number]   Date of Service  03/06/2024  HPI/Events of Note  Per RN patient having Type 7 stools. They attempted a rectal pouch and it failed. Received request for a flexiseal to protect patient's skin and contain.  6:27 pm K 4.6, creatinine 1.22  No contraindication reported nor identified  eICU Interventions  Fecal management system ordered     Intervention Category Intermediate Interventions: Other:  Damien ONEIDA Grout 03/06/2024, 2:36 AM

## 2024-03-06 NOTE — Evaluation (Signed)
 RT Evaluate and Treat Note  03/06/2024   Breathing is (select one): Same as normal    The following was found on auscultation (select multiple):  Bilateral Breath Sounds: Rhonchi (03/06/24 0803)  R Upper  Breath Sounds: Rhonchi (03/06/24 0803) L Upper Breath Sounds: Rhonchi (03/06/24 0803) R Lower Breath Sounds: Rhonchi (03/06/24 0803) L Lower Breath Sounds: Rhonchi (03/06/24 0803)    Cough Assessment: Cough: Weak (03/06/24 0452)    Most Recent Chest Xray:... (DG CHEST PORT 1 VIEW Result Date: 03/05/2024 CLINICAL DATA:  5626 Acute respiratory failure (HCC) 5626 [id number] History of ETT [id number] EXAM: PORTABLE CHEST - 1 VIEW COMPARISON:  03/02/2024 FINDINGS: Endotracheal tube terminates in the mid trachea. Weighted feeding tube courses below the diaphragm with the distal tip not included in the field of view. Right PICC terminates in the lower SVC. Lower lung volumes. Interstitial opacities throughout both lungs. Small bilateral pleural effusions. No pneumothorax. No cardiomegaly. IMPRESSION: 1. Interstitial opacities in both lungs are more prominent, possibly due to mild interstitial edema. Small bilateral pleural effusions are also present with bibasilar atelectasis. 2. Right PICC has been placed in the interim, terminating in the lower SVC. Similarly positioned endotracheal and weighted feeding tubes. Electronically Signed   By: Rogelia Myers M.D.   On: 03/05/2024 10:31      The following medications and/or interventions were ordered/changed/discontinued as part of the Respiratory Treatment protocol:   Medication Changes: no changes   Airway Clearance Changes: no changes   Oxygen  Therapy Changes: no changes

## 2024-03-06 NOTE — Progress Notes (Addendum)
 NAME:  Marcayla Budge, MRN:  [id number], DOB:  1946-01-29, LOS: 7 ADMISSION DATE:  02/27/2024, CONSULTATION DATE:  02/28/24 REFERRING MD:  Haze CHIEF COMPLAINT:  Acute respiratory failure   History of Present Illness:  Patient is an 78 yo F w/ pertinent PMH COPD on 2L  at home, OSA, HTN, HLD, T2DM, CKD 3a presents to [facility] on 10/14 w/ AMS found to have multiple embolic strokes and strep bacteremia, requiring invasive mechanical ventilation.    [name] LKN 10/10. Patient found on 10/13 on floor confused by EMS, brought to [facility] ED. Patient arrived to ED meeting SIRS criteria, became lethargic with increased respiratory distress requiring intubation.     Patient remains intubated, on fentanyl  and propofol  drip, no longer on pressors. Though no source identified on TEE, treating for presumed infective endocarditis. Plan to transition from CTX to Penicillin  today.    Lines: Intubated, PICC, Cortrak, urinary catheter, fecal management   Pertinent  Medical History   Past Medical History:  Diagnosis Date   Abdominal aneurysm    Dr. Harvey follows lLOV 2 ''17 per pt around 2 cm   Anemia    as a child   Arthritis    left ankle, right knee, right SI joint, wrists, lower back   Breast cancer in female [facility])    Left   COPD (chronic obstructive pulmonary disease) (HCC)    ephysema-Dr. 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 Eletha in OR [place]   Sleep apnea    cpap used sometimes, uses oxygen  concentrator 2 l/m nasally bedtime   Varicella as child   Significant Hospital  Events: Including procedures, antibiotic start and stop dates in addition to other pertinent events   10/14: Admitted to ICU requiring IMV 10/17: No embolic source seen on TEE. Treating for presumed infective endocarditis.  10/18: Failing PSV weans with agitation. Transient episodes of bradycardia.  10/21: Transition from CTX to Penicillin    Interim History / Subjective:  Afebrile overnight.   I/O 3.38 L UOP in last 24h, down -1.3L in last 24h Total +13 L since admission  Stools x4 in last 24h  Labs CBC: WBC 9.1 > 10.2, Hgb 9.0 > 9.0 Cr: 1.22 > 1.25 GFR: 45 > 44 BNP: 301 > 315  CBG: 180-190 Triglycerides 139  Initial blood cx 10/13 with strep in 3/4 bottles Repeat blood cx 10/15: NG 5d  Objective    Blood pressure (!) 155/57, pulse 90, temperature 98.4 F (36.9 C), resp. rate 15, height 5' 4.5 (1.638 m), weight 104 kg, SpO2 93%.    Vent Mode: PSV;CPAP FiO2 (%):  [40 %] 40 % Set Rate:  [20 bmp] 20 bmp Vt Set:  [420 mL] 420 mL PEEP:  [5 cmH20] 5 cmH20 Pressure Support:  [12 cmH20-14 cmH20] 12 cmH20 Plateau Pressure:  [10 cmH20-19 cmH20] 10 cmH20   Intake/Output Summary (Last 24 hours) at 03/06/2024 0912 Last data filed at 03/06/2024 0700 Gross per 24 hour  Intake 1883.18  ml  Output 3350 ml  Net -1466.82 ml   Filed Weights   03/03/24 0500 03/04/24 0500 03/05/24 0401  Weight: 103 kg 105 kg 104 kg    Examination: General: Sedated, intubated.  CV: Normal S1/S2. No extra heart sounds. Warm and well-perfused. Pulm: Ventilated. CTAB. No increased WOB. Abd: Soft, distended.  Skin:  Warm, dry. Ext: Stable pitting edema of upper and lower extremities with dependent edema.  Neuro: Sedated. Not responsive to pain.    Resolved problem list   Assessment and Plan   #Acute metabolic encephalopathy #Strep bacteremia  TTE and TEE without source of embolism identified. Brain MRA normal. CSF non-infectious. Plan for treatment of presumptive infective endocarditis per ID.  Tolerated amoxicillin  trial yesterday without issue.  - Plan to narrow from CTX to Penicillin  per ID  - Repeat blood cx 10/15: Ng5d - Cont Cortrak continuous feeds - at goal of 40 ml/hr  #AHRF #Hx severe COPD + 13L since admission. Remains fluid overloaded on exam. S/p 160 mg IV lasix  x2 yesterday with 3L UOP and stable AKI. BNP relatively stable.  - Repeat Lasix  IV 160 mg with metolazone today with plan for afternoon BMP and additional lasix  dose if indicated  - Cont Brovana , Pulmicort , Tupelir, Deliresp  - Trial weaning from ventilator as able  #AKI Elevated Cr stable 1.22 > 1.25. Likely secondary to diuresis.  - Cont diuresis as above - Trend kidney function   #T2DM BG remain elevated.  - Increase basal glargine to 12 units daily  - Add feed coverage: 2u aspart q4 - Cont moderate SSI   #PAD - Currently:  Fentanyl  150 mcg/hr, Propofol  25 mcg/kg/min, Seroquel 25 nightly  - Trial transition from propofol  to precedex   Stress ulcer prophylaxis: PPI VTE Prophylaxis: Lovenox    Labs   CBC: Recent Labs  Lab 03/02/24 0908 03/03/24 0217 03/04/24 0223 03/05/24 0300 03/06/24 0340  WBC 10.6* 9.0 9.4 9.1 10.2  NEUTROABS 9.0*  --   --   --   --   HGB 9.9* 9.7* 9.7* 9.0* 9.0*  HCT 32.2* 31.8* 31.8* 30.2* 30.4*  MCV 89.4 90.3 90.6 92.9 91.6  PLT 165 126* 133* 189 223    Basic Metabolic Panel: Recent Labs  Lab 03/01/24 1029 03/02/24 0444 03/02/24 0613 03/03/24 0217 03/04/24 0223 03/05/24 0300 03/05/24 1827 03/06/24 0340  NA  --   --    < > 139 141 139 143 142  K  --   --    < > 4.2 4.0 4.1 4.6 3.6  CL  --   --    < > 102 103 101 106 100  CO2  --   --    < > 25 27 29 26  33*  GLUCOSE  --   --    < > 166* 148* 164* 173* 202*  BUN  --   --    < > 24* 26* 32* 36* 41*  CREATININE  --   --    < > 0.88 1.04* 1.21* 1.22* 1.25*  CALCIUM   --   --    < > 8.2* 8.3* 8.1* 7.9* 8.7*  MG 2.4 2.6*  --  2.3 2.0  --  2.1  --   PHOS 3.5 2.9  --  2.5 3.5  --   --   --    < > = values  in this interval not displayed.   GFR: Estimated Creatinine Clearance: 42.6 mL/min (A) (by C-G formula based on SCr of 1.25 mg/dL (  H)). Recent Labs  Lab 02/29/24 0320 03/01/24 0444 03/03/24 0217 03/04/24 0223 03/05/24 0300 03/06/24 0340  WBC 12.9*   < > 9.0 9.4 9.1 10.2  LATICACIDVEN 1.2  --   --   --   --   --    < > = values in this interval not displayed.    Liver Function Tests: Recent Labs  Lab 03/02/24 0908  AST 23  ALT 16  ALKPHOS 93  BILITOT 0.3  PROT 5.8*  ALBUMIN 2.1*   No results for input(s): LIPASE, AMYLASE in the last 168 hours. No results for input(s): AMMONIA in the last 168 hours.  ABG    Component Value Date/Time   HCO3 27.5 02/27/2024 2341   TCO2 29 02/27/2024 2341   O2SAT 96 02/27/2024 2341     Coagulation Profile: No results for input(s): INR, PROTIME in the last 168 hours.  Cardiac Enzymes: Recent Labs  Lab 02/29/24 0320 03/01/24 0444 03/02/24 0613 03/02/24 0908 03/03/24 0217  CKTOTAL 2,418* 1,638* 380* 389* 226    HbA1C: Hemoglobin A1C  Date/Time Value Ref Range Status  01/25/2023 12:50 PM 6.1 (A) 4.0 - 5.6 % Final  07/19/2022 01:20 PM 7.2 (A) 4.0 - 5.6 % Final   Hgb A1c MFr Bld  Date/Time Value Ref Range Status  02/28/2024 06:00 AM 7.0 (H) 4.8 - 5.6 % Final    Comment:    (NOTE) Diagnosis of Diabetes The following HbA1c ranges recommended by the American Diabetes Association (ADA) may be used as an aid in the diagnosis of diabetes mellitus.  Hemoglobin             Suggested A1C NGSP%              Diagnosis  <5.7                   Non Diabetic  5.7-6.4                Pre-Diabetic  >6.4                   Diabetic  <7.0                   Glycemic control for                       adults with diabetes.    01/25/2021 12:08 PM 6.3 (H) 4.8 - 5.6 % Final    Comment:    (NOTE) Pre diabetes:          5.7%-6.4%  Diabetes:              >6.4%  Glycemic control for   <7.0% adults with diabetes      CBG: Recent Labs  Lab 03/05/24 1516 03/05/24 1924 03/05/24 2318 03/06/24 0316 03/06/24 0723  GLUCAP 163* 166* 180* 196* 196*    Past Medical History:  She,  has a past medical history of Abdominal aneurysm, Anemia, Arthritis, Breast cancer in female [facility]), COPD (chronic obstructive pulmonary disease) (HCC), Dyspnea, Headache, Heart murmur, Hyperlipidemia, Hypertension, Multiple thyroid  nodules, Murmur, cardiac (1950), Osteoporosis, Pneumonia, Pre-diabetes, Requires continuous at home supplemental oxygen , Sebaceous cyst, Sleep apnea, and Varicella (as child).   Surgical History:   Past Surgical History:  Procedure Laterality Date   APPENDECTOMY     2008   BREAST LUMPECTOMY Left 08/31/2017   BREAST LUMPECTOMY WITH RADIOACTIVE SEED LOCALIZATION Left 08/10/2017   Procedure: BREAST LUMPECTOMY WITH RADIOACTIVE SEED LOCALIZATION;  Surgeon: Ebbie Cough, MD;  Location[facility] OR;  Service: General;  Laterality: Left;   CESAREAN SECTION     1972   COLONOSCOPY     COLONOSCOPY WITH PROPOFOL  N/A 04/16/2016   Procedure: COLONOSCOPY WITH PROPOFOL ;  Surgeon: Belvie Just, MD;  Location: [facility] ENDOSCOPY;  Service: Endoscopy;  Laterality: N/A;   CYST REMOVAL NECK Left 04/13/2016   Procedure: EXCISION OF SEBACEOUS CYST LEFT POSTERIOR NECK;  Surgeon: Krystal Spinner, MD;  Location: [facility] OR;  Service: General;  Laterality: Left;   Excision of Pelvic Absess, Right Ovary     2008   RE-EXCISION OF BREAST CANCER,SUPERIOR MARGINS Left 08/31/2017   Procedure: RE-EXCISION OF LEFT  BREAST MARGINS ERAS PATHWAY;  Surgeon: Ebbie Cough, MD;  Location: [facility] OR;  Service: General;  Laterality: Left;   TUBAL LIGATION  1980     Social History:   reports that she quit smoking about 12 years ago. Her smoking use included cigarettes. She started smoking about 62 years ago. She has a 50 pack-year smoking history. She has never used smokeless tobacco. She reports current alcohol use. She reports that she does not use  drugs.   Family History:  Her family history includes Alzheimer's disease in her brother and father; Breast cancer in her paternal grandmother; Diabetes in her brother; Heart disease in her mother; Hyperlipidemia in her brother and brother; Hypertension in her brother, brother, and mother; Stroke (age of onset: 25) in her father.   Allergies Allergies  Allergen Reactions   Benicar  [Olmesartan ] Swelling    Swelling of face and arms    [name]  [Valsartan ] Swelling    Swelling of face and arms    Hydrocodone -Acetaminophen  Nausea And Vomiting    Severe vomiting   Lisinopril  Cough   Monosodium Glutamate Other (See Comments)    Facial swelling per pt   Codeine Other (See Comments)    jittery   Lead Acetate Rash   Nickel Rash    Severe rash to infection: pt is allergic to all metals other than sterling silver or gold jewelry.    Penicillins Other (See Comments)    Unknown reaction per family at the bedside     Home Medications  Prior to Admission medications   Medication Sig Start Date End Date Taking? Authorizing Provider  Accu-Chek Softclix Lancets lancets Use as instructed to check blood sugar 1x daily 03/30/23   Shamleffer, Ibtehal Jaralla, MD  acetaminophen  (TYLENOL ) 500 MG tablet Take 1,000 mg by mouth every 6 (six) hours as needed.    [provider]  albuterol  (PROVENTIL ) (2.5 MG/3ML) 0.083% nebulizer solution Take 3 mLs (2.5 mg total) by nebulization every 6 (six) hours as needed for wheezing or shortness of breath. 12/31/21   Parrett, Madelin RAMAN, NP  Albuterol  Sulfate (PROAIR  RESPICLICK) 108 (90 Base) MCG/ACT AEPB Inhale 1-2 puffs into the lungs every 6 (six) hours as needed (for wheezing/shortness of breath). 03/31/20   Geronimo Amel, MD  amLODipine  (NORVASC ) 10 MG tablet TAKE ONE TABLET BY MOUTH ONCE DAILY 11/24/22   Burnard Debby LABOR, MD  aspirin  EC 81 MG tablet Take 1 tablet (81 mg total) by mouth daily. 07/23/19   Kroeger, Krista M., PA-C  Blood Glucose Monitoring Suppl  (ACCU-CHEK AVIVA PLUS) w/Device KIT Use to monitor glucose levels once per day; E11.9 06/10/23   Shamleffer, Donell Cardinal, MD  carvedilol  (COREG ) 6.25 MG tablet TAKE ONE TABLET BY MOUTH EVERY MORNING and TAKE ONE TABLET BY MOUTH EVERYDAY AT BEDTIME 11/24/22   Burnard Debby LABOR,  MD  cetirizine  (ZYRTEC ) 10 MG tablet Take 10 mg by mouth every morning. 12/28/23   [provider]  Cholecalciferol (VITAMIN D ) 50 MCG (2000 UT) tablet Take 2,000 Units by mouth daily. 02/07/24   [provider]  Cholecalciferol (VITAMIN D3) 125 MCG (5000 UT) CAPS TAKE ONE CAPSULE BY MOUTH ONCE DAILY 12/29/22   Joshua Debby CROME, MD  dapagliflozin  propanediol (FARXIGA ) 10 MG TABS tablet Take 1 tablet (10 mg total) by mouth daily. 09/20/23   Shamleffer, Ibtehal Jaralla, MD  denosumab  (PROLIA ) 60 MG/ML SOSY injection Inject 60 mg into the skin every 6 (six) months. 06/16/22   Joshua Debby CROME, MD  FEROSUL 325 (65 Fe) MG tablet Take 325 mg by mouth 3 (three) times a week. 08/09/23   [provider]  Fluticasone -Umeclidin-Vilant (TRELEGY ELLIPTA ) 100-62.5-25 MCG/ACT AEPB INHALE ONE (1) PUFF BY MOUTH AND INTO THE LUNGS DAILY 12/12/23   Geronimo Amel, MD  glucose blood (ACCU-CHEK AVIVA PLUS) test strip USE TO Check blood glucose 1x DAILY 06/03/23   Shamleffer, Ibtehal Jaralla, MD  glucose blood (ACCU-CHEK GUIDE) test strip USE 1 STRIP ONCE DAILY 04/09/20   Kassie Mallick, MD  glucose blood test strip USE 1 STRIP ONCE DAILY 02/29/20   Kassie Mallick, MD  methocarbamol  (ROBAXIN ) 500 MG tablet Take 1 tablet (500 mg total) by mouth every 8 (eight) hours as needed for muscle spasms. 08/19/22   Elnor Lauraine BRAVO, NP  OXYGEN  Inhale 2-3 L into the lungs continuous. When exerting self    [provider]  Potassium Chloride  ER 20 MEQ TBCR Take 1 tablet by mouth every morning. 01/07/23   [provider]  roflumilast  (DALIRESP ) 500 MCG TABS tablet TAKE ONE TABLET BY MOUTH EVERY MORNING 11/25/22   Geronimo Amel, MD   rosuvastatin  (CRESTOR ) 40 MG tablet TAKE ONE TABLET BY MOUTH EVERYDAY AT BEDTIME 11/24/22   Burnard Debby LABOR, MD  SitaGLIPtin -MetFORMIN  HCl (JANUMET  XR) 50-1000 MG TB24 Take 1 tablet by mouth daily before supper. 09/20/23   Shamleffer, Ibtehal Jaralla, MD  spironolactone  (ALDACTONE ) 25 MG tablet TAKE ONE TABLET BY MOUTH ONCE DAILY 11/24/22   Burnard Debby LABOR, MD  thiamine  (VITAMIN B1) 100 MG tablet TAKE 1 TABLET BY MOUTH EVERY OTHER DAY 01/20/23   Joshua Debby CROME, MD

## 2024-03-06 NOTE — Telephone Encounter (Signed)
 Sorry to hear. Please ensure post hispital followup with APP. If still in hospital they could request a PCCM Consult

## 2024-03-06 NOTE — Progress Notes (Signed)
 Pharmacy Antibiotic Note  Victoria [name] is a 78 y.o. female admitted on 02/27/2024 with AMS. 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  Due to family report of a potential penicillin  allergy, the patient underwent an oral amoxicillin  challenge on 10/20. Oral amoxicillin  was tolerated and the patient was transitioned to Penicillin  G as a continuous infusion on 10/21.   Plan: -Penicillin  G 18 million units IV as a continuous infusion -Monitor for signs/symptoms of an allergic reaction -Monitor renal function -Follow up signs of clinical improvement, LOT, de-escalation of antibiotics   Height: 5' 4.5 (163.8 cm) Weight: 104 kg (229 lb 4.5 oz) IBW/kg (Calculated) : 55.85  Temp (24hrs), Avg:99.2 F (37.3 C), Min:96.8 F (36 C), Max:100.6 F (38.1 C)  Recent Labs  Lab 02/29/24 0320 03/01/24 0444 03/02/24 0908 03/02/24 1617 03/03/24 0217 03/04/24 0223 03/05/24 0300 03/05/24 1827 03/06/24 0340  WBC 12.9*   < > 10.6*  --  9.0 9.4 9.1  --  10.2  CREATININE 1.37*   < > 0.91   < > 0.88 1.04* 1.21* 1.22* 1.25*  LATICACIDVEN 1.2  --   --   --   --   --   --   --   --    < > = values in this interval not displayed.    Estimated Creatinine Clearance: 42.6 mL/min (A) (by C-G formula based on SCr of 1.25 mg/dL (H)).    Allergies  Allergen Reactions   Benicar  [Olmesartan ] Swelling    Swelling of face and arms    [name]  [Valsartan ] Swelling    Swelling of face and arms    Hydrocodone -Acetaminophen  Nausea And Vomiting    Severe vomiting   Lisinopril  Cough   Monosodium Glutamate Other (See Comments)    Facial swelling per pt   Codeine Other (See Comments)    jittery   Lead Acetate Rash   Nickel Rash    Severe rash to infection: pt is allergic to all metals other than sterling silver or gold jewelry.    Penicillins Other (See Comments)    Unknown reaction per  family at the bedside    Antimicrobials this admission: Cefepime 10/13 > 10/15 Vancomycin 10/13 x1 Flagyl 10/13 > 10/15 Ceftriaxone 10/15 > 10/21 Microbiology results: 10/13 BCx: Streptococcus salivarius (penicillin  sensitive) 10/14 MRSA PCR: negative 10/15 VZV: negative 10/15 CSF culture: ngtd (final) 10/15 Bcx: negative  Thank you for allowing pharmacy to be a part of this patient's care.  WENDI Amon Rocher, PharmD PGY-1 Pharmacy Resident [facility] 03/06/2024 10:55 AM

## 2024-03-06 NOTE — Progress Notes (Addendum)
 Subjective:  Intubated sedated   Antibiotics:  Anti-infectives (From admission, onward)    Start     Dose/Rate Route Frequency Ordered Stop   03/06/24 1030  penicillin  G potassium 18 Million Units in dextrose  5 % 500 mL CONTINUOUS infusion        18 Million Units 20.8 mL/hr over 24 Hours Intravenous Every 24 hours 03/06/24 0943     03/05/24 1345  amoxicillin  (AMOXIL ) 250 MG/5ML suspension 500 mg        500 mg Per Tube  Once 03/05/24 1254 03/05/24 1554   02/29/24 2200  cefTRIAXone (ROCEPHIN) 2 g in sodium chloride  0.9 % 100 mL IVPB  Status:  Discontinued        2 g 200 mL/hr over 30 Minutes Intravenous Every 12 hours 02/29/24 0912 03/06/24 0938   02/28/24 2300  vancomycin (VANCOREADY) IVPB 750 mg/150 mL  Status:  Discontinued        750 mg 150 mL/hr over 60 Minutes Intravenous Every 24 hours 02/28/24 0604 02/28/24 1723   02/28/24 1000  metroNIDAZOLE (FLAGYL) IVPB 500 mg  Status:  Discontinued        500 mg 100 mL/hr over 60 Minutes Intravenous Every 12 hours 02/28/24 0553 02/28/24 1723   02/28/24 1000  ceFEPIme (MAXIPIME) 2 g in sodium chloride  0.9 % 100 mL IVPB  Status:  Discontinued        2 g 200 mL/hr over 30 Minutes Intravenous Every 12 hours 02/28/24 0604 02/29/24 0912   02/27/24 2230  ceFEPIme (MAXIPIME) 2 g in sodium chloride  0.9 % 100 mL IVPB        2 g 200 mL/hr over 30 Minutes Intravenous  Once 02/27/24 2222 02/28/24 0016   02/27/24 2230  metroNIDAZOLE (FLAGYL) IVPB 500 mg        500 mg 100 mL/hr over 60 Minutes Intravenous  Once 02/27/24 2222 02/28/24 0100   02/27/24 2230  vancomycin (VANCOCIN) IVPB 1000 mg/200 mL premix        1,000 mg 200 mL/hr over 60 Minutes Intravenous  Once 02/27/24 2222 02/28/24 0100   02/27/24 2115  cefTRIAXone (ROCEPHIN) 2 g in sodium chloride  0.9 % 100 mL IVPB  Status:  Discontinued        2 g 200 mL/hr over 30 Minutes Intravenous Once 02/27/24 2113 02/27/24 2243       Medications: Scheduled Meds:  arformoterol   15 mcg  Nebulization Q12H   aspirin   81 mg Per Tube Daily   budesonide  (PULMICORT ) nebulizer solution  0.5 mg Nebulization BID   Chlorhexidine  Gluconate Cloth  6 each Topical Daily   enoxaparin  (LOVENOX ) injection  40 mg Subcutaneous Q24H   feeding supplement (PROSource TF20)  60 mL Per Tube Daily   insulin  aspart  0-15 Units Subcutaneous Q4H   insulin  aspart  2 Units Subcutaneous Q4H   insulin  glargine-yfgn  12 Units Subcutaneous Daily   methocarbamol   500 mg Per Tube TID   mouth rinse  15 mL Mouth Rinse Q2H   oxyCODONE  10 mg Per Tube Q6H   pantoprazole (PROTONIX) IV  40 mg Intravenous Q24H   polyethylene glycol  17 g Per Tube Daily   QUEtiapine  25 mg Per Tube BID   revefenacin   175 mcg Nebulization Daily   roflumilast   500 mcg Per Tube q morning   sodium chloride  flush  10-40 mL Intracatheter Q12H   thiamine   100 mg Per Tube Daily   Continuous Infusions:  feeding  supplement (OSMOLITE 1.5 CAL) 40 mL/hr at 03/06/24 2100   fentaNYL  infusion INTRAVENOUS 200 mcg/hr (03/06/24 2100)   penicillin  G potassium 18 Million Units in dextrose  5 % 500 mL CONTINUOUS infusion 18 Million Units (03/06/24 1154)   potassium chloride  10 mEq (03/06/24 2208)   propofol  (DIPRIVAN ) infusion 35 mcg/kg/min (03/06/24 2110)   PRN Meds:.acetaminophen  (TYLENOL ) oral liquid 160 mg/5 mL, diphenhydrAMINE, EPINEPHrine , fentaNYL , hydrALAZINE , ipratropium-albuterol , labetalol, mouth rinse, polyethylene glycol, senna, sodium chloride  flush    Objective: Weight change:   Intake/Output Summary (Last 24 hours) at 03/06/2024 2253 Last data filed at 03/06/2024 2100 Gross per 24 hour  Intake 2041.94 ml  Output 2500 ml  Net -458.06 ml   Blood pressure (!) 178/40, pulse 83, temperature 99.7 F (37.6 C), resp. rate 20, height 5' 4.5 (1.638 m), weight 104 kg, SpO2 93%. Temp:  [96.1 F (35.6 C)-100.6 F (38.1 C)] 99.7 F (37.6 C) (10/21 2115) Pulse Rate:  [58-115] 83 (10/21 2115) Resp:  [10-34] 20 (10/21 2115) BP:  (127-262)/(32-103) 178/40 (10/21 2130) SpO2:  [87 %-95 %] 93 % (10/21 2115) FiO2 (%):  [0.9 %-40 %] 0.9 % (10/21 2017)  Physical Exam: Physical Exam Constitutional:      General: She is not in acute distress.    Appearance: She is well-developed. She is not diaphoretic.     Interventions: She is intubated.  HENT:     Head: Normocephalic and atraumatic.     Right Ear: External ear normal.     Left Ear: External ear normal.     Mouth/Throat:     Pharynx: No oropharyngeal exudate.  Eyes:     General: No scleral icterus.    Conjunctiva/sclera: Conjunctivae normal.  Cardiovascular:     Rate and Rhythm: Normal rate and regular rhythm.     Heart sounds: No murmur heard.    No friction rub. No gallop.  Pulmonary:     Effort: She is intubated.     Breath sounds: Rhonchi present. No wheezing.  Abdominal:     General: There is no distension.     Palpations: Abdomen is soft.  Musculoskeletal:        General: No tenderness. Normal range of motion.  Lymphadenopathy:     Cervical: No cervical adenopathy.  Skin:    General: Skin is warm and dry.  Neurological:     Mental Status: She is alert.     Motor: No abnormal muscle tone.      CBC:    BMET Recent Labs    03/06/24 0340 03/06/24 1600  NA 142 144  K 3.6 3.5  CL 100 98  CO2 33* 34*  GLUCOSE 202* 242*  BUN 41* 46*  CREATININE 1.25* 1.21*  CALCIUM  8.7* 9.2     Liver Panel  No results for input(s): PROT, ALBUMIN, AST, ALT, ALKPHOS, BILITOT, BILIDIR, IBILI in the last 72 hours.     Sedimentation Rate No results for input(s): ESRSEDRATE in the last 72 hours. C-Reactive Protein No results for input(s): CRP in the last 72 hours.  Micro Results: Recent Results (from the past 720 hours)  Resp panel by RT-PCR (RSV, Flu A&B, Covid) Anterior Nasal Swab     Status: None   Collection Time: 02/27/24  9:13 PM   Specimen: Anterior Nasal Swab  Result Value Ref Range Status   SARS Coronavirus 2 by RT  PCR NEGATIVE NEGATIVE Final   Influenza A by PCR NEGATIVE NEGATIVE Final   Influenza B by PCR NEGATIVE NEGATIVE Final  Comment: (NOTE) The Xpert Xpress SARS-CoV-2/FLU/RSV plus assay is intended as an aid in the diagnosis of influenza from Nasopharyngeal swab specimens and should not be used as a sole basis for treatment. Nasal washings and aspirates are unacceptable for Xpert Xpress SARS-CoV-2/FLU/RSV testing.  Fact Sheet for Patients: BloggerCourse.com  Fact Sheet for Healthcare Providers: SeriousBroker.it  This test is not yet approved or cleared by the [place]  FDA and has been authorized for detection and/or diagnosis of SARS-CoV-2 by FDA under an Emergency Use Authorization (EUA). This EUA will remain in effect (meaning this test can be used) for the duration of the COVID-19 declaration under Section 564(b)(1) of the Act, 21 U.S.C. section 360bbb-3(b)(1), unless the authorization is terminated or revoked.     Resp Syncytial Virus by PCR NEGATIVE NEGATIVE Final    Comment: (NOTE) Fact Sheet for Patients: BloggerCourse.com  Fact Sheet for Healthcare Providers: SeriousBroker.it  This test is not yet approved or cleared by the [place]  FDA and has been authorized for detection and/or diagnosis of SARS-CoV-2 by FDA under an Emergency Use Authorization (EUA). This EUA will remain in effect (meaning this test can be used) for the duration of the COVID-19 declaration under Section 564(b)(1) of the Act, 21 U.S.C. section 360bbb-3(b)(1), unless the authorization is terminated or revoked.  Performed at [facility], [address], [place], [place] [address]   Blood Culture (routine x 2)     Status: Abnormal   Collection Time: 02/27/24  9:13 PM   Specimen: BLOOD  Result Value Ref Range Status   Specimen Description BLOOD SITE NOT SPECIFIED  Final   Special  Requests   Final    BOTTLES DRAWN AEROBIC AND ANAEROBIC Blood Culture adequate volume   Culture  Setup Time   Final    GRAM POSITIVE COCCI IN CHAINS ANAEROBIC BOTTLE ONLY    Culture (A)  Final    STREPTOCOCCUS SALIVARIUS SUSCEPTIBILITIES PERFORMED ON PREVIOUS CULTURE WITHIN THE LAST 5 DAYS. Performed at [facility], [address] [place], [place] [address]    Report Status 03/01/2024 FINAL  Final  Blood Culture (routine x 2)     Status: Abnormal   Collection Time: 02/27/24  9:18 PM   Specimen: BLOOD LEFT ARM  Result Value Ref Range Status   Specimen Description BLOOD LEFT ARM  Final   Special Requests   Final    BOTTLES DRAWN AEROBIC AND ANAEROBIC Blood Culture adequate volume   Culture  Setup Time   Final    GRAM POSITIVE COCCI IN CHAINS IN BOTH AEROBIC AND ANAEROBIC BOTTLES CRITICAL RESULT CALLED TO, READ BACK BY AND VERIFIED WITH: PHARMD K. HAMMONS [id number] @ 1542 FH Performed at [facility], [address] [place], [place] [address]    Culture STREPTOCOCCUS SALIVARIUS (A)  Final   Report Status 03/01/2024 FINAL  Final   Organism ID, Bacteria STREPTOCOCCUS SALIVARIUS  Final      Susceptibility   Streptococcus salivarius - MIC*    PENICILLIN  0.12 SENSITIVE Sensitive     CEFTRIAXONE <=0.12 SENSITIVE Sensitive     ERYTHROMYCIN 2 RESISTANT Resistant     LEVOFLOXACIN  2 SENSITIVE Sensitive     VANCOMYCIN 1 SENSITIVE Sensitive     * STREPTOCOCCUS SALIVARIUS  Blood Culture ID Panel (Reflexed)     Status: Abnormal   Collection Time: 02/27/24  9:18 PM  Result Value Ref Range Status   Enterococcus faecalis NOT DETECTED NOT DETECTED Final   Enterococcus Faecium NOT DETECTED NOT  DETECTED Final   Listeria monocytogenes NOT DETECTED NOT DETECTED Final   Staphylococcus species NOT DETECTED NOT DETECTED Final   Staphylococcus aureus (BCID) NOT DETECTED NOT DETECTED Final   Staphylococcus epidermidis NOT DETECTED NOT DETECTED Final   Staphylococcus lugdunensis NOT  DETECTED NOT DETECTED Final   Streptococcus species DETECTED (A) NOT DETECTED Final    Comment: Not Enterococcus species, Streptococcus agalactiae, Streptococcus pyogenes, or Streptococcus pneumoniae. CRITICAL RESULT CALLED TO, READ BACK BY AND VERIFIED WITH: PHARMD K. HAMMONS [id number] @ 1542 FH    Streptococcus agalactiae NOT DETECTED NOT DETECTED Final   Streptococcus pneumoniae NOT DETECTED NOT DETECTED Final   Streptococcus pyogenes NOT DETECTED NOT DETECTED Final   A.calcoaceticus-baumannii NOT DETECTED NOT DETECTED Final   Bacteroides fragilis NOT DETECTED NOT DETECTED Final   Enterobacterales NOT DETECTED NOT DETECTED Final   Enterobacter cloacae complex NOT DETECTED NOT DETECTED Final   Escherichia coli NOT DETECTED NOT DETECTED Final   Klebsiella aerogenes NOT DETECTED NOT DETECTED Final   Klebsiella oxytoca NOT DETECTED NOT DETECTED Final   Klebsiella pneumoniae NOT DETECTED NOT DETECTED Final   Proteus species NOT DETECTED NOT DETECTED Final   Salmonella species NOT DETECTED NOT DETECTED Final   Serratia marcescens NOT DETECTED NOT DETECTED Final   Haemophilus influenzae NOT DETECTED NOT DETECTED Final   Neisseria meningitidis NOT DETECTED NOT DETECTED Final   Pseudomonas aeruginosa NOT DETECTED NOT DETECTED Final   Stenotrophomonas maltophilia NOT DETECTED NOT DETECTED Final   Candida albicans NOT DETECTED NOT DETECTED Final   Candida auris NOT DETECTED NOT DETECTED Final   Candida glabrata NOT DETECTED NOT DETECTED Final   Candida krusei NOT DETECTED NOT DETECTED Final   Candida parapsilosis NOT DETECTED NOT DETECTED Final   Candida tropicalis NOT DETECTED NOT DETECTED Final   Cryptococcus neoformans/gattii NOT DETECTED NOT DETECTED Final    Comment: Performed at [facility], [address], [place], [place] [address]  MRSA Next Gen by PCR, Nasal     Status: None   Collection Time: 02/28/24  5:41 AM   Specimen: Nasal Mucosa; Nasal Swab  Result Value Ref Range  Status   MRSA by PCR Next Gen NOT DETECTED NOT DETECTED Final    Comment: (NOTE) The GeneXpert MRSA Assay (FDA approved for NASAL specimens only), is one component of a comprehensive MRSA colonization surveillance program. It is not intended to diagnose MRSA infection nor to guide or monitor treatment for MRSA infections. Test performance is not FDA approved in patients less than 34 years old. Performed at [facility], [address], [place], [place] [address]   CSF culture w Gram Stain     Status: None   Collection Time: 02/29/24  3:39 PM   Specimen: CSF; Cerebrospinal Fluid  Result Value Ref Range Status   Specimen Description CSF  Final   Special Requests NONE  Final   Gram Stain   Final    WBC PRESENT,BOTH PMN AND MONONUCLEAR NO ORGANISMS SEEN CYTOSPIN SMEAR    Culture   Final    NO GROWTH 3 DAYS Performed at [facility], [address], [place], [place] [address]    Report Status 03/03/2024 FINAL  Final  VZV PCR, CSF     Status: None   Collection Time: 02/29/24  3:39 PM   Specimen: Cerebrospinal Fluid  Result Value Ref Range Status   VZV PCR, CSF Negative Negative Final    Comment: (NOTE) No Varicella Zoster Virus DNA detected. Performed At: [facility]  [facility] [address] [place], [place] [id number] Jennette Shorter MD Ey:[id number]   Culture, blood (Routine X 2) w Reflex to ID Panel     Status: None   Collection Time: 02/29/24  4:00 PM   Specimen: BLOOD RIGHT FOREARM  Result Value Ref Range Status   Specimen Description BLOOD RIGHT FOREARM  Final   Special Requests   Final    BOTTLES DRAWN AEROBIC ONLY Blood Culture adequate volume   Culture   Final    NO GROWTH 5 DAYS Performed at [facility], [address] [place], [place] [address]    Report Status 03/05/2024 FINAL  Final  Culture, blood (Routine X 2) w Reflex to ID Panel     Status: None   Collection Time: 02/29/24  4:00 PM   Specimen: BLOOD  Result Value Ref Range Status    Specimen Description BLOOD RIGHT ANTECUBITAL  Final   Special Requests   Final    BOTTLES DRAWN AEROBIC AND ANAEROBIC Blood Culture adequate volume   Culture   Final    NO GROWTH 5 DAYS Performed at [facility], [address], [place], [place] [address]    Report Status 03/05/2024 FINAL  Final    Studies/Results: DG CHEST PORT 1 VIEW Result Date: 03/05/2024 CLINICAL DATA:  5626 Acute respiratory failure (HCC) [id number] History of ETT [id number] EXAM: PORTABLE CHEST - 1 VIEW COMPARISON:  03/02/2024 FINDINGS: Endotracheal tube terminates in the mid trachea. Weighted feeding tube courses below the diaphragm with the distal tip not included in the field of view. Right PICC terminates in the lower SVC. Lower lung volumes. Interstitial opacities throughout both lungs. Small bilateral pleural effusions. No pneumothorax. No cardiomegaly. IMPRESSION: 1. Interstitial opacities in both lungs are more prominent, possibly due to mild interstitial edema. Small bilateral pleural effusions are also present with bibasilar atelectasis. 2. Right PICC has been placed in the interim, terminating in the lower SVC. Similarly positioned endotracheal and weighted feeding tubes. Electronically Signed   By: Rogelia Myers M.D.   On: 03/05/2024 10:31      Assessment/Plan:  INTERVAL HISTORY: Patient tolerated amoxicillin  challenge and as mentioned has been on amoxicillin  and Zosyn in the past   Principal Problem:   Endocarditis Active Problems:   Altered mental status   Cerebrovascular accident (CVA) due to embolism of cerebral artery (HCC)   Bacteremia due to Streptococcus   Streptococcal bacteremia   Acute bacterial endocarditis   Pacemaker infection    Victoria [name] is a 78 y.o. 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  She has a penicillin  allergy listed that apparently family informed staff of which sounds to be  remote and is without a description.  End of note she has received several penicillins in recent years including Augmentin  and Zosyn. She passed her amoxicillin  challenge  #1 Presumptive native valve endocarditis with septic embolization to the brain  We will switch her over to penicillin  in house and when she becomes stable for discharge she can be set up with continuous penicillin  given over 24-hour period that can be given likely in a nursing facility versus home if she really improves dramatically.  I will put in OPAT orders for 4 weeks of treatment from date of negative blood cultures    #2 PCN allergy: see discussion above  #3 standard universal precautions  I have personally spent 50 minutes involved in face-to-face and non-face-to-face activities  for this patient on the day of the visit. Professional time spent includes the following activities: Preparing to see the patient (review of tests), Obtaining and/or reviewing separately obtained history (admission/discharge record), Performing a medically appropriate examination and/or evaluation , Ordering medications/tests/procedures, referring and communicating with other health care professionals, Documenting clinical information in the EMR, Independently interpreting results (not separately reported), Communicating results to the patient/family/caregiver, Counseling and educating the patient/family/caregiver and Care coordination (not separately reported).  . Evaluation of the patient requires complex antimicrobial therapy evaluation, counseling , isolation needs to reduce disease transmission and risk assessment and mitigation.   Diagnosis: Endocarditis  Culture Result: Streptococcus salivarius  Allergies  Allergen Reactions   Benicar  [Olmesartan ] Swelling    Swelling of face and arms    [name]  [Valsartan ] Swelling    Swelling of face and arms    Hydrocodone -Acetaminophen  Nausea And Vomiting    Severe vomiting   Lisinopril  Cough    Monosodium Glutamate Other (See Comments)    Facial swelling per pt   Codeine Other (See Comments)    jittery   Lead Acetate Rash   Nickel Rash    Severe rash to infection: pt is allergic to all metals other than sterling silver or gold jewelry.     OPAT Orders Discharge antibiotics to be given via PICC line Discharge antibiotics: Penicillin  IV 18 MU Q daily via continuous infusion Duration: 4 weeks End Date: 03/27/2024  [facility] Care Per Protocol:  Home health RN for IV administration and teaching; PICC line care and labs.    Labs weekly while on IV antibiotics: _x_ CBC with differential _x_ BMP __ CMP __ CRP __ ESR __ Vancomycin trough __ CK  x__ Please pull PIC at completion of IV antibiotics __ Please leave PIC in place until doctor has seen patient or been notified  Fax weekly labs to [phone]  Clinic Follow Up Appt:   Victoria [name] has an appointment on  03/22/2024 at 10AM with Dr. Dea at  [facility] for Infectious Disease, which  is located in the [facility] at  [address] in [place].  [address], which is located to the left of the elevators.  Phone: [phone]  Fax: [phone]  [email or web address]  The patient should arrive 30 minutes prior to their appoitment.  I will sign off for now.  Please call with further questions.    LOS: 7 days   Victoria [name] 03/06/2024, 10:53 PM

## 2024-03-06 NOTE — Progress Notes (Signed)
 [facility] ADULT ICU REPLACEMENT PROTOCOL   The patient does apply for the [facility] Adult ICU Electrolyte Replacment Protocol based on the criteria listed below:   1.Exclusion criteria: TCTS, ECMO, Dialysis, and Myasthenia Gravis patients 2. Is GFR >/= 30 ml/min? Yes.    [name] GFR today is 44 3. Is SCr </= 2? Yes.   [name] SCr is 1.25 mg/dL 4. Did SCr increase >/= 0.5 in 24 hours? No. 5.Pt's weight >40kg  Yes.   6. Abnormal electrolyte(s): Potassium  7. Electrolytes replaced per protocol 8.  Call MD STAT for K+ </= 2.5, Phos </= 1, or Mag </= 1 Physician:  Dr. Aventura  Nel Stoneking A Hermenia Fritcher 03/06/2024 4:53 AM

## 2024-03-07 DIAGNOSIS — A419 Sepsis, unspecified organism: Secondary | ICD-10-CM

## 2024-03-07 DIAGNOSIS — J9601 Acute respiratory failure with hypoxia: Secondary | ICD-10-CM | POA: Diagnosis not present

## 2024-03-07 DIAGNOSIS — E119 Type 2 diabetes mellitus without complications: Secondary | ICD-10-CM | POA: Diagnosis not present

## 2024-03-07 DIAGNOSIS — G9341 Metabolic encephalopathy: Secondary | ICD-10-CM | POA: Diagnosis not present

## 2024-03-07 LAB — CBC
HCT: 29.7 % — ABNORMAL LOW (ref 36.0–46.0)
Hemoglobin: 9.2 g/dL — ABNORMAL LOW (ref 12.0–15.0)
MCH: 27.7 pg (ref 26.0–34.0)
MCHC: 31 g/dL (ref 30.0–36.0)
MCV: 89.5 fL (ref 80.0–100.0)
Platelets: 246 K/uL (ref 150–400)
RBC: 3.32 MIL/uL — ABNORMAL LOW (ref 3.87–5.11)
RDW: 15.9 % — ABNORMAL HIGH (ref 11.5–15.5)
WBC: 12.4 K/uL — ABNORMAL HIGH (ref 4.0–10.5)
nRBC: 0.2 % (ref 0.0–0.2)

## 2024-03-07 LAB — GLUCOSE, CAPILLARY
Glucose-Capillary: 159 mg/dL — ABNORMAL HIGH (ref 70–99)
Glucose-Capillary: 167 mg/dL — ABNORMAL HIGH (ref 70–99)
Glucose-Capillary: 177 mg/dL — ABNORMAL HIGH (ref 70–99)
Glucose-Capillary: 193 mg/dL — ABNORMAL HIGH (ref 70–99)
Glucose-Capillary: 203 mg/dL — ABNORMAL HIGH (ref 70–99)
Glucose-Capillary: 238 mg/dL — ABNORMAL HIGH (ref 70–99)

## 2024-03-07 LAB — BASIC METABOLIC PANEL WITH GFR
Anion gap: 11 (ref 5–15)
BUN: 48 mg/dL — ABNORMAL HIGH (ref 8–23)
CO2: 34 mmol/L — ABNORMAL HIGH (ref 22–32)
Calcium: 8.6 mg/dL — ABNORMAL LOW (ref 8.9–10.3)
Chloride: 97 mmol/L — ABNORMAL LOW (ref 98–111)
Creatinine, Ser: 1.14 mg/dL — ABNORMAL HIGH (ref 0.44–1.00)
GFR, Estimated: 49 mL/min — ABNORMAL LOW (ref >60)
Glucose, Bld: 201 mg/dL — ABNORMAL HIGH (ref 70–99)
Potassium: 3.8 mmol/L (ref 3.5–5.1)
Sodium: 142 mmol/L (ref 135–145)

## 2024-03-07 LAB — MRSA NEXT GEN BY PCR, NASAL: MRSA by PCR Next Gen: NOT DETECTED

## 2024-03-07 LAB — PROCALCITONIN: Procalcitonin: 0.8 ng/mL

## 2024-03-07 MED ORDER — VANCOMYCIN HCL 2000 MG/400ML IV SOLN
2000.0000 mg | Freq: Once | INTRAVENOUS | Status: AC
  Administered 2024-03-07: 2000 mg via INTRAVENOUS
  Filled 2024-03-07: qty 400

## 2024-03-07 MED ORDER — PIPERACILLIN-TAZOBACTAM 3.375 G IVPB
3.3750 g | Freq: Three times a day (TID) | INTRAVENOUS | Status: DC
  Administered 2024-03-07 – 2024-03-08 (×3): 3.375 g via INTRAVENOUS
  Filled 2024-03-07 (×3): qty 50

## 2024-03-07 MED ORDER — METOLAZONE 5 MG PO TABS
5.0000 mg | ORAL_TABLET | Freq: Once | ORAL | Status: AC
  Administered 2024-03-07: 5 mg
  Filled 2024-03-07: qty 1

## 2024-03-07 MED ORDER — FUROSEMIDE 10 MG/ML IJ SOLN
160.0000 mg | Freq: Four times a day (QID) | INTRAVENOUS | Status: AC
  Administered 2024-03-07 (×2): 160 mg via INTRAVENOUS
  Filled 2024-03-07: qty 16
  Filled 2024-03-07: qty 10

## 2024-03-07 MED ORDER — VANCOMYCIN HCL 750 MG/150ML IV SOLN
750.0000 mg | Freq: Two times a day (BID) | INTRAVENOUS | Status: DC
  Administered 2024-03-07: 750 mg via INTRAVENOUS
  Filled 2024-03-07 (×2): qty 150

## 2024-03-07 MED ORDER — VANCOMYCIN HCL 750 MG/150ML IV SOLN: 750.0000 mg | INTRAVENOUS | Status: DC

## 2024-03-07 MED ORDER — INSULIN GLARGINE-YFGN 100 UNIT/ML ~~LOC~~ SOLN
20.0000 [IU] | Freq: Every day | SUBCUTANEOUS | Status: DC
  Administered 2024-03-07 – 2024-03-08 (×2): 20 [IU] via SUBCUTANEOUS
  Filled 2024-03-07 (×2): qty 0.2

## 2024-03-07 MED ORDER — INSULIN ASPART 100 UNIT/ML IJ SOLN
3.0000 [IU] | INTRAMUSCULAR | Status: DC
  Administered 2024-03-07 – 2024-03-08 (×6): 3 [IU] via SUBCUTANEOUS

## 2024-03-07 NOTE — Progress Notes (Signed)
 Pharmacy Antibiotic Note  Victoria [name] is a 78 y.o. female admitted on 02/27/2024 with pneumonia.  Pharmacy has been consulted for vancomycin dosing.  Patient presented after being found down at home by EMS. Patient became lethargic with increasing respiratory distress requiring intubation. Blood cultures from 10/13 resulted positive for strep salivarius with septic embolization to the brain and presumptive native valve endocarditis. Antibiotic regimen is being broadened 10/22 to empirically cover for HAP d/t change in sputum appearance. Will dose vancomycin for CNS coverage based on nomogram, using AdjBW to target trough 15-20.   Height: 5' 4.5 (163.8 cm) Weight: 101.8 kg (224 lb 6.9 oz) IBW/kg (Calculated) : 55.85  Temp (24hrs), Avg:99.1 F (37.3 C), Min:96.1 F (35.6 C), Max:100.6 F (38.1 C)  Recent Labs  Lab 03/03/24 0217 03/04/24 0223 03/05/24 0300 03/05/24 1827 03/06/24 0340 03/06/24 1600 03/07/24 0300  WBC 9.0 9.4 9.1  --  10.2  --  12.4*  CREATININE 0.88 1.04* 1.21* 1.22* 1.25* 1.21* 1.14*    Estimated Creatinine Clearance: 46.2 mL/min (A) (by C-G formula based on SCr of 1.14 mg/dL (H)).    Allergies  Allergen Reactions   Benicar  [Olmesartan ] Swelling    Swelling of face and arms    [name]  [Valsartan ] Swelling    Swelling of face and arms    Hydrocodone -Acetaminophen  Nausea And Vomiting    Severe vomiting   Lisinopril  Cough   Monosodium Glutamate Other (See Comments)    Facial swelling per pt   Codeine Other (See Comments)    jittery   Lead Acetate Rash   Nickel Rash    Severe rash to infection: pt is allergic to all metals other than sterling silver or gold jewelry.    Antimicrobials this admission: Vancomycin 10/13 x 1  Flagyl 10/13 >> 10/14 Cefepime 10/13 >> 10/15  Ceftriaxone 10/15 >> 10/20 Penicillin  G 10/21 x 1   Microbiology results: 10/13 BCx: strep salivarius  10/15 BCx: no growth  10/15 CSF culture: negative  10/14 MRSA PCR:  negative  10/22 MRSA PCR: pending  10/22 TA: pending  Plan:  GIVE Vancomycin 2000 mg IV x1 (Wt used: 224 lb, kg- 101.8)  THEN Vancomycin 750 mg IV Q12H.  Start Zosyn 3.375g IV q8h  Thank you for allowing pharmacy to be a part of this patient's care.  Administrator, arts Student Pharmacist  03/07/2024,10:53 AM

## 2024-03-07 NOTE — Progress Notes (Signed)
 NAME:  Victoria [name], MRN:  [id number], DOB:  06/04/1945, LOS: 8 ADMISSION DATE:  02/27/2024, CONSULTATION DATE:  02/28/24 REFERRING MD:  Haze  CHIEF COMPLAINT:  Acute respiratory failure   History of Present Illness:  Patient is an 78 yo F w/ pertinent PMH COPD on 2L [place] at home, OSA, HTN, HLD, T2DM, CKD 3a presents to [facility] on 10/14 w/ AMS found to have multiple embolic strokes and strep bacteremia, requiring invasive mechanical ventilation.    [name] LKN 10/10. Patient found on 10/13 on floor confused by EMS, brought to [facility] ED. Patient arrived to ED meeting SIRS criteria, became lethargic with increased respiratory distress requiring intubation.     Patient remains intubated, on fentanyl  and propofol  drip, no longer on pressors. 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    Lines: Intubated, PICC, Cortrak, urinary catheter, fecal management   Pertinent  Medical History   Past Medical History:  Diagnosis Date   Abdominal aneurysm    Dr. Harvey follows lLOV 2 ''17 per pt around 2 cm   Anemia    as a child   Arthritis    left ankle, right knee, right SI joint, wrists, lower back   Breast cancer in female [facility])    Left   COPD (chronic obstructive pulmonary disease) (HCC)    ephysema-Dr. 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 Eletha in OR [place]   Sleep apnea    cpap used sometimes, uses oxygen  concentrator 2 l/m nasally bedtime   Varicella as child    Significant Hospital  Events: Including procedures, antibiotic start and stop dates in addition to other pertinent events   10/14: Admitted to ICU requiring IMV 10/17: No embolic source seen on TEE. Treating for presumed infective endocarditis.  10/18: Failing PSV weans with agitation. Transient episodes of bradycardia.  10/21: Transition from CTX to Penicillin . Trialed transition from propofol  to precedex but did not tolerate.  10/22: Initiate Zosyn for empiric HAP treatment. Stop penicillin .   Interim History / Subjective:  Afebrile overnight. Intermittent tachypnea to 25. BP 130-160/40s with MAP>65. Satting low 90s on vent (420 TV, 5 PEEP, FiO2 40, 20 RR).    I/O in last 24h: 3.4 L UOP, stool (-1.5L total) Total +11.6L since admission (improving)   Labs CBC: WBC 10.2>12.4, Hgb 9.0 > 9.2 BMP: Cr 1.2>1.1, GFR 45>49  Initial blood cx 10/13 with strep in 3/4 bottles Repeat blood cx 10/15: NG 5d  Objective    Blood pressure (!) 139/44, pulse 75, temperature 99.5 F (37.5 C), resp. rate 14, height 5' 4.5 (1.638 m), weight 101.8 kg, SpO2 92%.    Vent Mode: PRVC FiO2 (%):  [40 %] 40 % Set Rate:  [20 bmp] 20 bmp Vt Set:  [420 mL] 420 mL PEEP:  [5 cmH20] 5 cmH20 Pressure Support:  [12 cmH20] 12 cmH20 Plateau Pressure:  [10 cmH20-11 cmH20] 11 cmH20  Intake/Output Summary (Last 24 hours) at 03/07/2024 0739 Last data filed at 03/07/2024 0700 Gross per 24 hour  Intake 2484.85 ml  Output 4025 ml  Net -1540.15 ml   Filed Weights   03/05/24 0401 03/06/24 2309 03/07/24 0411  Weight: 104 kg 101.8 kg 101.8 kg    Examination: General: Distressed, grimacing, gagging.  CV: Tachycardic S1/S2. No extra heart sounds. Warm and well-perfused. Pulm: Ventilated respirations. Diminished breath sounds throughout, some crackles on R.   Abd: Soft, distended.  Skin:  Warm, moist.  Neuro: Sedated. Unable to follow commands.    Resolved problem list   Assessment and Plan   #Respiratory failure #Hx  severe COPD Admission weight 84kg, now 101.8 kg. Remains +11L since admission. S/p 160 mg IV lasix  x2 yesterday with 3.4L UOP and stable kidney function. Attempted weaning propofol  earlier this morning though patient appeared agitated, with tachycardia and increased SBP. Trach sputum appears yellow this morning.  - Given change in sputum appearance, plan for culture and empiric treatment of HAP with Zosyn  - Repeat 160 IV lasix  x2 with metolazone 5 mg  - Cont Brovana , Pulmicort , Tupelir, Deliresp  - Daily weights  - Wean from ventilator as able  #Acute metabolic encephalopathy #Strep bacteremia  presumed endocarditis  TTE and TEE without source of embolism identified. Brain MRA normal. CSF non-infectious. Plan for treatment of presumptive infective endocarditis per ID. Repeat blood cx 10/15: NG5d.   - Zosyn per above  - Cont Cortrak continuous feeds - at goal rate   #AKI Elevated Cr stable 1.25>1.14. Likely secondary to diuresis.  - Cont diuresis as above - Trend kidney function   #T2DM BG elevated to 200s. Required 36u SAI in addition to 12 u LAI.  - Increase LAI to 20 units  - Cont novolog  2u q4 hours  - Cont mod SSI  #PAD - Currently:  Fentanyl  200 mcg/hr, Propofol  35 mcg/kg/min, Oxycodone 10 q6, Seroquel 25 nightly - Consider transition from propofol  to ketamine if agitation continues  - Wean sedation as able    Labs   CBC: Recent Labs  Lab 03/02/24 0908 03/03/24 0217 03/04/24 0223 03/05/24 0300 03/06/24 0340 03/07/24 0300  WBC 10.6* 9.0 9.4 9.1 10.2 12.4*  NEUTROABS 9.0*  --   --   --   --   --   HGB 9.9* 9.7* 9.7* 9.0* 9.0* 9.2*  HCT 32.2* 31.8* 31.8* 30.2* 30.4* 29.7*  MCV 89.4 90.3 90.6 92.9 91.6 89.5  PLT 165 126* 133* 189 223 246    Basic Metabolic Panel: Recent Labs  Lab 03/01/24 1029 03/02/24 0444 03/02/24 0613 03/03/24 0217 03/04/24 0223 03/05/24 0300 03/05/24 1827 03/06/24 0340 03/06/24 1600 03/07/24 0300  NA  --   --    < > 139 141 139 143  142 144 142  K  --   --    < > 4.2 4.0 4.1 4.6 3.6 3.5 3.8  CL  --   --    < > 102 103 101 106 100 98 97*  CO2  --   --    < > 25 27 29 26  33* 34* 34*  GLUCOSE  --   --    < > 166* 148* 164* 173* 202* 242* 201*  BUN  --   --    < > 24* 26* 32* 36* 41* 46* 48*  CREATININE  --   --    < > 0.88 1.04* 1.21* 1.22* 1.25* 1.21* 1.14*  CALCIUM   --   --    < >  8.2* 8.3* 8.1* 7.9* 8.7* 9.2 8.6*  MG 2.4 2.6*  --  2.3 2.0  --  2.1  --  2.1  --   PHOS 3.5 2.9  --  2.5 3.5  --   --   --   --   --    < > = values in this interval not displayed.   GFR: Estimated Creatinine Clearance: 46.2 mL/min (A) (by C-G formula based on SCr of 1.14 mg/dL (H)). Recent Labs  Lab 03/04/24 0223 03/05/24 0300 03/06/24 0340 03/07/24 0300  WBC 9.4 9.1 10.2 12.4*    Liver Function Tests: Recent Labs  Lab 03/02/24 0908  AST 23  ALT 16  ALKPHOS 93  BILITOT 0.3  PROT 5.8*  ALBUMIN 2.1*   No results for input(s): LIPASE, AMYLASE in the last 168 hours. No results for input(s): AMMONIA in the last 168 hours.  ABG    Component Value Date/Time   HCO3 27.5 02/27/2024 2341   TCO2 29 02/27/2024 2341   O2SAT 96 02/27/2024 2341     Coagulation Profile: No results for input(s): INR, PROTIME in the last 168 hours.  Cardiac Enzymes: Recent Labs  Lab 03/01/24 0444 03/02/24 0613 03/02/24 0908 03/03/24 0217  CKTOTAL 1,638* 380* 389* 226    HbA1C: Hemoglobin A1C  Date/Time Value Ref Range Status  01/25/2023 12:50 PM 6.1 (A) 4.0 - 5.6 % Final  07/19/2022 01:20 PM 7.2 (A) 4.0 - 5.6 % Final   Hgb A1c MFr Bld  Date/Time Value Ref Range Status  02/28/2024 06:00 AM 7.0 (H) 4.8 - 5.6 % Final    Comment:    (NOTE) Diagnosis of Diabetes The following HbA1c ranges recommended by the American Diabetes Association (ADA) may be used as an aid in the diagnosis of diabetes mellitus.  Hemoglobin             Suggested A1C NGSP%              Diagnosis  <5.7                   Non Diabetic  5.7-6.4                 Pre-Diabetic  >6.4                   Diabetic  <7.0                   Glycemic control for                       adults with diabetes.    01/25/2021 12:08 PM 6.3 (H) 4.8 - 5.6 % Final    Comment:    (NOTE) Pre diabetes:          5.7%-6.4%  Diabetes:              >6.4%  Glycemic control for   <7.0% adults with diabetes     CBG: Recent Labs  Lab 03/06/24 1926 03/06/24 2057 03/06/24 2333 03/07/24 0332 03/07/24 0724  GLUCAP 236* 223* 214* 238* 167*    Past Medical History:  She,  has a past medical history of Abdominal aneurysm, Anemia, Arthritis, Breast cancer in female [facility]), COPD (chronic obstructive pulmonary disease) (HCC), Dyspnea, Headache, Heart murmur, Hyperlipidemia, Hypertension, Multiple thyroid  nodules, Murmur, cardiac (1950), Osteoporosis, Pneumonia, Pre-diabetes, Requires continuous at home supplemental oxygen , Sebaceous cyst, Sleep apnea, and Varicella (as child).   Surgical History:   Past Surgical  History:  Procedure Laterality Date   APPENDECTOMY     2008   BREAST LUMPECTOMY Left 08/31/2017   BREAST LUMPECTOMY WITH RADIOACTIVE SEED LOCALIZATION Left 08/10/2017   Procedure: BREAST LUMPECTOMY WITH RADIOACTIVE SEED LOCALIZATION;  Surgeon: Ebbie Cough, MD;  Location: [facility] OR;  Service: General;  Laterality: Left;   CESAREAN SECTION     1972   COLONOSCOPY     COLONOSCOPY WITH PROPOFOL  N/A 04/16/2016   Procedure: COLONOSCOPY WITH PROPOFOL ;  Surgeon: Belvie Just, MD;  Location: [facility];  Service: Endoscopy;  Laterality: N/A;   CYST REMOVAL NECK Left 04/13/2016   Procedure: EXCISION OF SEBACEOUS CYST LEFT POSTERIOR NECK;  Surgeon: Krystal Spinner, MD;  Location: [facility] OR;  Service: General;  Laterality: Left;   Excision of Pelvic Absess, Right Ovary     2008   RE-EXCISION OF BREAST CANCER,SUPERIOR MARGINS Left 08/31/2017   Procedure: RE-EXCISION OF LEFT  BREAST MARGINS ERAS PATHWAY;  Surgeon: Ebbie Cough, MD;  Location: [facility] OR;  Service:  General;  Laterality: Left;   TUBAL LIGATION  1980     Social History:   reports that she quit smoking about 12 years ago. Her smoking use included cigarettes. She started smoking about 62 years ago. She has a 50 pack-year smoking history. She has never used smokeless tobacco. She reports current alcohol use. She reports that she does not use drugs.   Family History:  Her family history includes Alzheimer's disease in her brother and father; Breast cancer in her paternal grandmother; Diabetes in her brother; Heart disease in her mother; Hyperlipidemia in her brother and brother; Hypertension in her brother, brother, and mother; Stroke (age of onset: 30) in her father.   Allergies Allergies  Allergen Reactions   Benicar  [Olmesartan ] Swelling    Swelling of face and arms    [name]  [Valsartan ] Swelling    Swelling of face and arms    Hydrocodone -Acetaminophen  Nausea And Vomiting    Severe vomiting   Lisinopril  Cough   Monosodium Glutamate Other (See Comments)    Facial swelling per pt   Codeine Other (See Comments)    jittery   Lead Acetate Rash   Nickel Rash    Severe rash to infection: pt is allergic to all metals other than sterling silver or gold jewelry.      Home Medications  Prior to Admission medications   Medication Sig Start Date End Date Taking? Authorizing Provider  Accu-Chek Softclix Lancets lancets Use as instructed to check blood sugar 1x daily 03/30/23   Shamleffer, Ibtehal Jaralla, MD  acetaminophen  (TYLENOL ) 500 MG tablet Take 1,000 mg by mouth every 6 (six) hours as needed.    [provider]  albuterol  (PROVENTIL ) (2.5 MG/3ML) 0.083% nebulizer solution Take 3 mLs (2.5 mg total) by nebulization every 6 (six) hours as needed for wheezing or shortness of breath. 12/31/21   Parrett, Madelin RAMAN, NP  Albuterol  Sulfate (PROAIR  RESPICLICK) 108 (90 Base) MCG/ACT AEPB Inhale 1-2 puffs into the lungs every 6 (six) hours as needed (for wheezing/shortness of breath).  03/31/20   Geronimo Amel, MD  amLODipine  (NORVASC ) 10 MG tablet TAKE ONE TABLET BY MOUTH ONCE DAILY 11/24/22   Burnard Debby LABOR, MD  aspirin  EC 81 MG tablet Take 1 tablet (81 mg total) by mouth daily. 07/23/19   Kroeger, Bruno HERO., PA-C  Blood Glucose Monitoring Suppl (ACCU-CHEK AVIVA PLUS) w/Device KIT Use to monitor glucose levels once per day; E11.9 06/10/23   Shamleffer, Donell Cardinal, MD  carvedilol  (COREG ) 6.25 MG  tablet TAKE ONE TABLET BY MOUTH EVERY MORNING and TAKE ONE TABLET BY MOUTH EVERYDAY AT BEDTIME 11/24/22   Burnard Debby LABOR, MD  cetirizine  (ZYRTEC ) 10 MG tablet Take 10 mg by mouth every morning. 12/28/23   [provider]  Cholecalciferol (VITAMIN D ) 50 MCG (2000 UT) tablet Take 2,000 Units by mouth daily. 02/07/24   [provider]  Cholecalciferol (VITAMIN D3) 125 MCG (5000 UT) CAPS TAKE ONE CAPSULE BY MOUTH ONCE DAILY 12/29/22   Joshua Debby CROME, MD  dapagliflozin  propanediol (FARXIGA ) 10 MG TABS tablet Take 1 tablet (10 mg total) by mouth daily. 09/20/23   Shamleffer, Ibtehal Jaralla, MD  denosumab  (PROLIA ) 60 MG/ML SOSY injection Inject 60 mg into the skin every 6 (six) months. 06/16/22   Joshua Debby CROME, MD  FEROSUL 325 (65 Fe) MG tablet Take 325 mg by mouth 3 (three) times a week. 08/09/23   [provider]  Fluticasone -Umeclidin-Vilant (TRELEGY ELLIPTA ) 100-62.5-25 MCG/ACT AEPB INHALE ONE (1) PUFF BY MOUTH AND INTO THE LUNGS DAILY 12/12/23   Geronimo Amel, MD  glucose blood (ACCU-CHEK AVIVA PLUS) test strip USE TO Check blood glucose 1x DAILY 06/03/23   Shamleffer, Ibtehal Jaralla, MD  glucose blood (ACCU-CHEK GUIDE) test strip USE 1 STRIP ONCE DAILY 04/09/20   Kassie Mallick, MD  glucose blood test strip USE 1 STRIP ONCE DAILY 02/29/20   Kassie Mallick, MD  methocarbamol  (ROBAXIN ) 500 MG tablet Take 1 tablet (500 mg total) by mouth every 8 (eight) hours as needed for muscle spasms. 08/19/22   Elnor Lauraine BRAVO, NP  OXYGEN  Inhale 2-3 L into the lungs continuous.  When exerting self    [provider]  Potassium Chloride  ER 20 MEQ TBCR Take 1 tablet by mouth every morning. 01/07/23   [provider]  roflumilast  (DALIRESP ) 500 MCG TABS tablet TAKE ONE TABLET BY MOUTH EVERY MORNING 11/25/22   Geronimo Amel, MD  rosuvastatin  (CRESTOR ) 40 MG tablet TAKE ONE TABLET BY MOUTH EVERYDAY AT BEDTIME 11/24/22   Burnard Debby LABOR, MD  SitaGLIPtin -MetFORMIN  HCl (JANUMET  XR) 50-1000 MG TB24 Take 1 tablet by mouth daily before supper. 09/20/23   Shamleffer, Ibtehal Jaralla, MD  spironolactone  (ALDACTONE ) 25 MG tablet TAKE ONE TABLET BY MOUTH ONCE DAILY 11/24/22   Burnard Debby LABOR, MD  thiamine  (VITAMIN B1) 100 MG tablet TAKE 1 TABLET BY MOUTH EVERY OTHER DAY 01/20/23   Joshua Debby CROME, MD

## 2024-03-07 NOTE — Plan of Care (Signed)
   Problem: Fluid Volume: Goal: Ability to maintain a balanced intake and output will improve Outcome: Progressing   Problem: Metabolic: Goal: Ability to maintain appropriate glucose levels will improve Outcome: Progressing   Problem: Skin Integrity: Goal: Risk for impaired skin integrity will decrease Outcome: Progressing

## 2024-03-07 NOTE — Progress Notes (Signed)
 Orthopedic Tech Progress Note Patient Details:  Victoria [name] [birth year] [id number]  Ortho Devices Type of Ortho Device: Radio broadcast assistant Ortho Device/Splint Location: Bilateral Ortho Device/Splint Interventions: Ordered, Application, Adjustment   Post Interventions Patient Tolerated: Well  Adine MARLA Blush 03/07/2024, 11:09 AM

## 2024-03-07 NOTE — Evaluation (Signed)
 RT Evaluate and Treat Note  03/07/2024   Breathing is (select one): Worse than normal    The following was found on auscultation (select multiple):  Bilateral Breath Sounds: Diminished;Rhonchi (03/07/24 0804)  R Upper  Breath Sounds: Diminished (03/07/24 0416) L Upper Breath Sounds: Diminished (03/07/24 0416) R Lower Breath Sounds: Diminished (03/07/24 0416) L Lower Breath Sounds: Diminished (03/07/24 0416)    Cough Assessment: Cough: None (03/07/24 0000)    Most Recent Chest Xray:... (No results found.    The following medications and/or interventions were ordered/changed/discontinued as part of the Respiratory Treatment protocol:   Medication Changes: No changes   Airway Clearance Changes: No changes   Oxygen  Therapy Changes: No changes

## 2024-03-07 NOTE — TOC Progression Note (Signed)
 Transition of Care [facility]) - Progression Note    Patient Details  Name: Victoria [name] MRN: [id number] Date of Birth: 1945-08-29  [facility] CM/SW Contact  Tom-Johnson, Harvest Muskrat, RN Phone Number: 03/07/2024, 12:51 PM  Clinical Narrative:     Continues to be intubated and sedated. On IV abx, ID following.   Patient not Medically ready for discharge.  CM will continue to follow as patient progresses with care towards discharge.                      Expected Discharge Plan and Services                                               Social Drivers of Health (SDOH) Interventions SDOH Screenings   Food Insecurity: Patient Unable To Answer (03/05/2024)  Housing: Unknown (03/05/2024)  Transportation Needs: Patient Unable To Answer (03/05/2024)  Utilities: Patient Unable To Answer (03/05/2024)  Alcohol Screen: Low Risk  (06/02/2022)  Depression (PHQ2-9): Low Risk  (11/24/2022)  Financial Resource Strain: Low Risk  (06/02/2022)  Physical Activity: Inactive (06/02/2022)  Social Connections: Patient Unable To Answer (03/05/2024)  Stress: No Stress Concern Present (06/02/2022)  Tobacco Use: Medium Risk (02/28/2024)    Readmission Risk Interventions     No data to display

## 2024-03-07 NOTE — Plan of Care (Signed)

## 2024-03-07 NOTE — Telephone Encounter (Signed)
 I called and spoke with the pt's daughter, [name] and notified of MR's response. She is set up for HFU on 04/02/24. She has been seen by [facility] in the hospital. Nothing further needed.

## 2024-03-08 DIAGNOSIS — A419 Sepsis, unspecified organism: Secondary | ICD-10-CM | POA: Diagnosis not present

## 2024-03-08 DIAGNOSIS — J9601 Acute respiratory failure with hypoxia: Secondary | ICD-10-CM | POA: Diagnosis not present

## 2024-03-08 DIAGNOSIS — G9341 Metabolic encephalopathy: Secondary | ICD-10-CM | POA: Diagnosis not present

## 2024-03-08 DIAGNOSIS — E119 Type 2 diabetes mellitus without complications: Secondary | ICD-10-CM | POA: Diagnosis not present

## 2024-03-08 LAB — BASIC METABOLIC PANEL WITH GFR
Anion gap: 12 (ref 5–15)
Anion gap: 13 (ref 5–15)
BUN: 54 mg/dL — ABNORMAL HIGH (ref 8–23)
BUN: 59 mg/dL — ABNORMAL HIGH (ref 8–23)
CO2: 38 mmol/L — ABNORMAL HIGH (ref 22–32)
CO2: 37 mmol/L — ABNORMAL HIGH (ref 22–32)
Calcium: 8.6 mg/dL — ABNORMAL LOW (ref 8.9–10.3)
Calcium: 8.8 mg/dL — ABNORMAL LOW (ref 8.9–10.3)
Chloride: 95 mmol/L — ABNORMAL LOW (ref 98–111)
Chloride: 95 mmol/L — ABNORMAL LOW (ref 98–111)
Creatinine, Ser: 1.21 mg/dL — ABNORMAL HIGH (ref 0.44–1.00)
Creatinine, Ser: 1.25 mg/dL — ABNORMAL HIGH (ref 0.44–1.00)
GFR, Estimated: 45 mL/min — ABNORMAL LOW (ref >60)
GFR, Estimated: 44 mL/min — ABNORMAL LOW (ref >60)
Glucose, Bld: 184 mg/dL — ABNORMAL HIGH (ref 70–99)
Glucose, Bld: 212 mg/dL — ABNORMAL HIGH (ref 70–99)
Potassium: 2.9 mmol/L — ABNORMAL LOW (ref 3.5–5.1)
Potassium: 3.2 mmol/L — ABNORMAL LOW (ref 3.5–5.1)
Sodium: 145 mmol/L (ref 135–145)
Sodium: 145 mmol/L (ref 135–145)

## 2024-03-08 LAB — GLUCOSE, CAPILLARY
Glucose-Capillary: 142 mg/dL — ABNORMAL HIGH (ref 70–99)
Glucose-Capillary: 163 mg/dL — ABNORMAL HIGH (ref 70–99)
Glucose-Capillary: 164 mg/dL — ABNORMAL HIGH (ref 70–99)
Glucose-Capillary: 170 mg/dL — ABNORMAL HIGH (ref 70–99)
Glucose-Capillary: 197 mg/dL — ABNORMAL HIGH (ref 70–99)
Glucose-Capillary: 224 mg/dL — ABNORMAL HIGH (ref 70–99)

## 2024-03-08 MED ORDER — WHITE PETROLATUM EX OINT
TOPICAL_OINTMENT | CUTANEOUS | Status: DC | PRN
  Administered 2024-03-08 – 2024-03-11 (×2): 0.2 via TOPICAL
  Filled 2024-03-08: qty 28.35

## 2024-03-08 MED ORDER — METOLAZONE 5 MG PO TABS
5.0000 mg | ORAL_TABLET | Freq: Once | ORAL | Status: AC
  Administered 2024-03-08: 5 mg
  Filled 2024-03-08: qty 1

## 2024-03-08 MED ORDER — ALBUMIN HUMAN 25 % IV SOLN
25.0000 g | Freq: Four times a day (QID) | INTRAVENOUS | Status: AC
  Administered 2024-03-08 – 2024-03-09 (×4): 25 g via INTRAVENOUS
  Filled 2024-03-08 (×4): qty 100

## 2024-03-08 MED ORDER — HYDRALAZINE HCL 20 MG/ML IJ SOLN
5.0000 mg | INTRAMUSCULAR | Status: DC | PRN
  Administered 2024-03-08 – 2024-03-11 (×6): 10 mg via INTRAVENOUS
  Filled 2024-03-08 (×6): qty 1

## 2024-03-08 MED ORDER — POTASSIUM CHLORIDE 20 MEQ PO PACK
40.0000 meq | PACK | ORAL | Status: AC
  Administered 2024-03-08 (×2): 40 meq
  Filled 2024-03-08 (×2): qty 2

## 2024-03-08 MED ORDER — SODIUM CHLORIDE 0.9 % IV SOLN
2.0000 g | Freq: Two times a day (BID) | INTRAVENOUS | Status: DC
  Administered 2024-03-08 (×2): 2 g via INTRAVENOUS
  Filled 2024-03-08 (×2): qty 12.5

## 2024-03-08 MED ORDER — FUROSEMIDE 10 MG/ML IJ SOLN
160.0000 mg | Freq: Three times a day (TID) | INTRAVENOUS | Status: DC
  Administered 2024-03-08 – 2024-03-10 (×7): 160 mg via INTRAVENOUS
  Filled 2024-03-08 (×5): qty 10
  Filled 2024-03-08 (×4): qty 16

## 2024-03-08 MED ORDER — INSULIN GLARGINE-YFGN 100 UNIT/ML ~~LOC~~ SOLN
25.0000 [IU] | Freq: Every day | SUBCUTANEOUS | Status: DC
  Filled 2024-03-08: qty 0.25

## 2024-03-08 MED ORDER — INSULIN GLARGINE-YFGN 100 UNIT/ML ~~LOC~~ SOLN
5.0000 [IU] | Freq: Once | SUBCUTANEOUS | Status: AC
  Administered 2024-03-08: 5 [IU] via SUBCUTANEOUS
  Filled 2024-03-08: qty 0.05

## 2024-03-08 MED ORDER — STERILE WATER FOR INJECTION IJ SOLN
INTRAMUSCULAR | Status: AC
  Administered 2024-03-08: 10 mL
  Filled 2024-03-08: qty 10

## 2024-03-08 MED ORDER — INSULIN ASPART 100 UNIT/ML IJ SOLN
4.0000 [IU] | INTRAMUSCULAR | Status: DC
  Administered 2024-03-08 – 2024-03-09 (×6): 4 [IU] via SUBCUTANEOUS

## 2024-03-08 MED ORDER — ACETAZOLAMIDE SODIUM 500 MG IJ SOLR
500.0000 mg | Freq: Once | INTRAMUSCULAR | Status: AC
  Administered 2024-03-08: 500 mg via INTRAVENOUS
  Filled 2024-03-08: qty 500

## 2024-03-08 NOTE — Consult Note (Signed)
 WOC Nurse Consult Note:  WOC consult performed remotely utilizing imaging and chart review Reason for Consult: medical device related pressure injury upper lip Wound type: medical device related pressure injury upper lip Pressure Injury POA: No Measurement: see nursing flow sheets Wound bed:100% red, moist Drainage (amount, consistency, odor) sanguinous Periwound: intact Dressing procedure/placement/frequency:  Cleanse with NS, pat dry.  Apply Vaseline and leave open to air.      WOC Nurse team will follow with you and see patient within 10 days for wound assessments.  Please notify WOC nurses of any acute changes in the wounds or any new areas of concern  Thank you,  Doyal Polite, MSN, RN, [facility] WOC Team [phone] (Available Mon-Fri 0700-1500)

## 2024-03-08 NOTE — Progress Notes (Signed)
 NAME:  Victoria [name], MRN:  [id number], DOB:  February 04, 1946, LOS: 9 ADMISSION DATE:  02/27/2024, CONSULTATION DATE:  02/28/24 REFERRING MD:  Haze CHIEF COMPLAINT:  acute respiratory failure    History of Present Illness:  Patient is an 78 yo F w/ pertinent PMH COPD on 2L [place] at home, OSA, HTN, HLD, T2DM, CKD 3a presents to [facility] on 10/14 w/ AMS found to have multiple embolic strokes and strep bacteremia, requiring invasive mechanical ventilation. [name] LKN 10/10. Patient found on 10/13 on floor confused by EMS, brought to [facility] ED. Patient arrived to ED meeting SIRS criteria, became lethargic with increased respiratory distress requiring intubation.     Patient remains intubated, on fentanyl  and propofol  drip, no longer on pressors. 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    Lines: Intubated, PICC, Cortrak, urinary catheter, fecal management   Pertinent  Medical History   Past Medical History:  Diagnosis Date   Abdominal aneurysm    Dr. Harvey follows lLOV 2 ''17 per pt around 2 cm   Anemia    as a child   Arthritis    left ankle, right knee, right SI joint, wrists, lower back   Breast cancer in female [facility])    Left   COPD (chronic obstructive pulmonary disease) (HCC)    ephysema-Dr. 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 Eletha in [place]   Sleep apnea    cpap used sometimes, uses oxygen  concentrator 2 l/m nasally bedtime   Varicella  as child   Significant Hospital Events: Including procedures, antibiotic start and stop dates in addition to other pertinent events   10/14: Admitted to ICU requiring IMV 10/17: No embolic source seen on TEE. Treating for presumed infective endocarditis.  10/18: Failing PSV weans with agitation. Transient episodes of bradycardia.  10/21: Transition from CTX to Penicillin . Trialed transition from propofol  to precedex but did not tolerate.  10/22: Initiate Zosyn for empiric HAP treatment. Stopped penicillin . Vancomycin added for CNS coverage.  10/23: D/c Zosyn and Vanc. Initiate Cefepime.   Interim History / Subjective:  Febrile to 100.4 overnight. BP 130-150/30-50, MAP largely >65. HR 80-90. Sating well on vent (420 TV, 5 PEEP, 40%, 18 RR).   I/O 3.7 UOP in last 24h, down -964.9 Total +10.6 L since admission  Labs BMP: Cr 1.14 > 1.21, Potassium 3.8> 2.9, GFR 49>45, Bicarb 34> 38 BG: 150-160s  10/22 MRSA swab negative.   Objective    Blood pressure (!) 145/41, pulse 91, temperature (!) 100.4 F (38 C), resp. rate 18, height 5' 4.5 (1.638 m), weight 102.9 kg, SpO2 95%. CVP:  [4 mmHg-13 mmHg] 8 mmHg  Vent Mode: PRVC FiO2 (%):  [40 %] 40 % Set Rate:  [16 bmp-20 bmp] 18 bmp Vt Set:  [420 mL] 420 mL PEEP:  [5 cmH20] 5 cmH20 Plateau Pressure:  [  4 cmH20-14 cmH20] 14 cmH20   Intake/Output Summary (Last 24 hours) at 03/08/2024 0751 Last data filed at 03/08/2024 0700 Gross per 24 hour  Intake 2845.13 ml  Output 3810 ml  Net -964.87 ml   Filed Weights   03/06/24 2309 03/07/24 0411 03/08/24 0700  Weight: 101.8 kg 101.8 kg 102.9 kg    Examination: General: Sedated, intubated Lungs: CTAB, equal chest rise and fall. No respiratory distress.  Cardiovascular: S1/S2. No extra heart sounds. Distal pulses intact. Warm. Abdomen: Normoactive BS. Soft.  Extremities: Pitting edema and dependent edema of upper and lower extremities.  Neuro: Sedated. Not responsive to pain. No coughing or  gagging.   Resolved problem list   Assessment and Plan   #Respiratory failure #Concern for HAP  #Hx severe COPD Febrile to 100.4 overnight. Admission weight 84kg, now 102.9 kg. Remains +10L since admission. S/p 160 mg IV lasix  x2 with metolazone 5 mg yesterday with 3.7L UOP and stable kidney function. Weight continues to increase. Trach sputum culture with rare gram neg rods, predom PMN. MRSA swab negative.  - Start Cefepime. D/c Zosyn and Vanc.   - Repeat 160 IV lasix  x2 with metolazone 5 mg  - Acetazolamide 500 x1 given rising bicarb - Cont Brovana , Pulmicort , Tupelir, Deliresp  - Daily weights  - Wean from ventilator as able  #Acute metabolic encephalopathy #Strep bacteremia  presumed endocarditis  TTE and TEE without source of embolism identified. Brain MRA normal. CSF non-infectious. Plan for treatment of presumptive infective endocarditis per ID. Repeat blood cx 10/15: NG5d.   - Start Cefepime. D/c Zosyn and Vanc.   - Cont Cortrak continuous feeds - at goal rate   #AKI Elevated Cr stable 1.14 >1.21. Likely secondary to diuresis.  - Cont diuresis as above with Diamox today  - Trend kidney function   #T2DM BG stable in 160s. Required 37u SAI in addition to 20 u LAI.  - Increase to LAI 25 units  - Cont novolog  4u q4 hours  - Cont mod SSI  #PAD - Currently:  Fentanyl  200 mcg/hr, Propofol  30 mcg/kg/min, Oxycodone 10 q6, Seroquel 25 nightly - Consider transition from propofol  to ketamine if agitation continues  - Wean sedation as able    Stress ulcer prophylaxis: PPI VTE prophylaxis: Lovenox   Labs   CBC: Recent Labs  Lab 03/02/24 0908 03/03/24 0217 03/04/24 0223 03/05/24 0300 03/06/24 0340 03/07/24 0300  WBC 10.6* 9.0 9.4 9.1 10.2 12.4*  NEUTROABS 9.0*  --   --   --   --   --   HGB 9.9* 9.7* 9.7* 9.0* 9.0* 9.2*  HCT 32.2* 31.8* 31.8* 30.2* 30.4* 29.7*  MCV 89.4 90.3 90.6 92.9 91.6 89.5  PLT 165 126* 133* 189 223 246    Basic Metabolic Panel: Recent Labs   Lab 03/01/24 1029 03/02/24 0444 03/02/24 0613 03/03/24 0217 03/04/24 0223 03/05/24 0300 03/05/24 1827 03/06/24 0340 03/06/24 1600 03/07/24 0300 03/08/24 0712  NA  --   --    < > 139 141   < > 143 142 144 142 145  K  --   --    < > 4.2 4.0   < > 4.6 3.6 3.5 3.8 2.9*  CL  --   --    < > 102 103   < > 106 100 98 97* 95*  CO2  --   --    < > 25 27   < > 26 33* 34* 34* 38*  GLUCOSE  --   --    < >  166* 148*   < > 173* 202* 242* 201* 184*  BUN  --   --    < > 24* 26*   < > 36* 41* 46* 48* 54*  CREATININE  --   --    < > 0.88 1.04*   < > 1.22* 1.25* 1.21* 1.14* 1.21*  CALCIUM   --   --    < > 8.2* 8.3*   < > 7.9* 8.7* 9.2 8.6* 8.6*  MG 2.4 2.6*  --  2.3 2.0  --  2.1  --  2.1  --   --   PHOS 3.5 2.9  --  2.5 3.5  --   --   --   --   --   --    < > = values in this interval not displayed.   GFR: Estimated Creatinine Clearance: 43.7 mL/min (A) (by C-G formula based on SCr of 1.21 mg/dL (H)). Recent Labs  Lab 03/04/24 0223 03/05/24 0300 03/06/24 0340 03/07/24 0300 03/07/24 1242  PROCALCITON  --   --   --   --  0.80  WBC 9.4 9.1 10.2 12.4*  --     Liver Function Tests: Recent Labs  Lab 03/02/24 0908  AST 23  ALT 16  ALKPHOS 93  BILITOT 0.3  PROT 5.8*  ALBUMIN 2.1*   No results for input(s): LIPASE, AMYLASE in the last 168 hours. No results for input(s): AMMONIA in the last 168 hours.  ABG    Component Value Date/Time   HCO3 27.5 02/27/2024 2341   TCO2 29 02/27/2024 2341   O2SAT 96 02/27/2024 2341     Coagulation Profile: No results for input(s): INR, PROTIME in the last 168 hours.  Cardiac Enzymes: Recent Labs  Lab 03/02/24 0613 03/02/24 0908 03/03/24 0217  CKTOTAL 380* 389* 226    HbA1C: Hemoglobin A1C  Date/Time Value Ref Range Status  01/25/2023 12:50 PM 6.1 (A) 4.0 - 5.6 % Final  07/19/2022 01:20 PM 7.2 (A) 4.0 - 5.6 % Final   Hgb A1c MFr Bld  Date/Time Value Ref Range Status  02/28/2024 06:00 AM 7.0 (H) 4.8 - 5.6 % Final    Comment:     (NOTE) Diagnosis of Diabetes The following HbA1c ranges recommended by the American Diabetes Association (ADA) may be used as an aid in the diagnosis of diabetes mellitus.  Hemoglobin             Suggested A1C NGSP%              Diagnosis  <5.7                   Non Diabetic  5.7-6.4                Pre-Diabetic  >6.4                   Diabetic  <7.0                   Glycemic control for                       adults with diabetes.    01/25/2021 12:08 PM 6.3 (H) 4.8 - 5.6 % Final    Comment:    (NOTE) Pre diabetes:          5.7%-6.4%  Diabetes:              >6.4%  Glycemic control for   <7.0%  adults with diabetes     CBG: Recent Labs  Lab 03/07/24 1525 03/07/24 1948 03/07/24 2346 03/08/24 0357 03/08/24 0721  GLUCAP 177* 203* 159* 164* 163*    Past Medical History:  She,  has a past medical history of Abdominal aneurysm, Anemia, Arthritis, Breast cancer in female [facility]), COPD (chronic obstructive pulmonary disease) (HCC), Dyspnea, Headache, Heart murmur, Hyperlipidemia, Hypertension, Multiple thyroid  nodules, Murmur, cardiac (1950), Osteoporosis, Pneumonia, Pre-diabetes, Requires continuous at home supplemental oxygen , Sebaceous cyst, Sleep apnea, and Varicella (as child).   Surgical History:   Past Surgical History:  Procedure Laterality Date   APPENDECTOMY     2008   BREAST LUMPECTOMY Left 08/31/2017   BREAST LUMPECTOMY WITH RADIOACTIVE SEED LOCALIZATION Left 08/10/2017   Procedure: BREAST LUMPECTOMY WITH RADIOACTIVE SEED LOCALIZATION;  Surgeon: Ebbie Cough, MD;  Location: [facility] OR;  Service: General;  Laterality: Left;   CESAREAN SECTION     1972   COLONOSCOPY     COLONOSCOPY WITH PROPOFOL  N/A 04/16/2016   Procedure: COLONOSCOPY WITH PROPOFOL ;  Surgeon: Belvie Just, MD;  Location: [facility] END[facility]COPY;  Service: Endoscopy;  Laterality: N/A;   CYST REMOVAL NECK Left 04/13/2016   Procedure: EXCISION OF SEBACEOUS CYST LEFT POSTERIOR NECK;  Surgeon: Krystal Spinner,  MD;  Location: [facility] OR;  Service: General;  Laterality: Left;   Excision of Pelvic Absess, Right Ovary     2008   RE-EXCISION OF BREAST CANCER,SUPERIOR MARGINS Left 08/31/2017   Procedure: RE-EXCISION OF LEFT  BREAST MARGINS ERAS PATHWAY;  Surgeon: Ebbie Cough, MD;  Location: [facility] OR;  Service: General;  Laterality: Left;   TUBAL LIGATION  1980     Social History:   reports that she quit smoking about 12 years ago. Her smoking use included cigarettes. She started smoking about 62 years ago. She has a 50 pack-year smoking history. She has never used smokeless tobacco. She reports current alcohol use. She reports that she does not use drugs.   Family History:  Her family history includes Alzheimer's disease in her brother and father; Breast cancer in her paternal grandmother; Diabetes in her brother; Heart disease in her mother; Hyperlipidemia in her brother and brother; Hypertension in her brother, brother, and mother; Stroke (age of onset: 90) in her father.   Allergies Allergies  Allergen Reactions   Benicar  [Olmesartan ] Swelling    Swelling of face and arms    [name]  [Valsartan ] Swelling    Swelling of face and arms    Hydrocodone -Acetaminophen  Nausea And Vomiting    Severe vomiting   Lisinopril  Cough   Monosodium Glutamate Other (See Comments)    Facial swelling per pt   Codeine Other (See Comments)    jittery   Lead Acetate Rash   Nickel Rash    Severe rash to infection: pt is allergic to all metals other than sterling silver or gold jewelry.      Home Medications  Prior to Admission medications   Medication Sig Start Date End Date Taking? Authorizing Provider  Accu-Chek Softclix Lancets lancets Use as instructed to check blood sugar 1x daily 03/30/23   Shamleffer, Ibtehal Jaralla, MD  acetaminophen  (TYLENOL ) 500 MG tablet Take 1,000 mg by mouth every 6 (six) hours as needed.    [provider]  albuterol  (PROVENTIL ) (2.5 MG/3ML) 0.083% nebulizer solution Take 3  mLs (2.5 mg total) by nebulization every 6 (six) hours as needed for wheezing or shortness of breath. 12/31/21   Parrett, Madelin RAMAN, NP  Albuterol  Sulfate (PROAIR  RESPICLICK) 108 (90 Base) MCG/ACT  AEPB Inhale 1-2 puffs into the lungs every 6 (six) hours as needed (for wheezing/shortness of breath). 03/31/20   Geronimo Amel, MD  amLODipine  (NORVASC ) 10 MG tablet TAKE ONE TABLET BY MOUTH ONCE DAILY 11/24/22   Burnard Debby LABOR, MD  aspirin  EC 81 MG tablet Take 1 tablet (81 mg total) by mouth daily. 07/23/19   Kroeger, Krista M., PA-C  Blood Glucose Monitoring Suppl (ACCU-CHEK AVIVA PLUS) w/Device KIT Use to monitor glucose levels once per day; E11.9 06/10/23   Shamleffer, Donell Cardinal, MD  carvedilol  (COREG ) 6.25 MG tablet TAKE ONE TABLET BY MOUTH EVERY MORNING and TAKE ONE TABLET BY MOUTH EVERYDAY AT BEDTIME 11/24/22   Burnard Debby LABOR, MD  cetirizine  (ZYRTEC ) 10 MG tablet Take 10 mg by mouth every morning. 12/28/23   [provider]  Cholecalciferol (VITAMIN D ) 50 MCG (2000 UT) tablet Take 2,000 Units by mouth daily. 02/07/24   [provider]  Cholecalciferol (VITAMIN D3) 125 MCG (5000 UT) CAPS TAKE ONE CAPSULE BY MOUTH ONCE DAILY 12/29/22   Joshua Debby CROME, MD  dapagliflozin  propanediol (FARXIGA ) 10 MG TABS tablet Take 1 tablet (10 mg total) by mouth daily. 09/20/23   Shamleffer, Ibtehal Jaralla, MD  denosumab  (PROLIA ) 60 MG/ML SOSY injection Inject 60 mg into the skin every 6 (six) months. 06/16/22   Joshua Debby CROME, MD  FEROSUL 325 (65 Fe) MG tablet Take 325 mg by mouth 3 (three) times a week. 08/09/23   [provider]  Fluticasone -Umeclidin-Vilant (TRELEGY ELLIPTA ) 100-62.5-25 MCG/ACT AEPB INHALE ONE (1) PUFF BY MOUTH AND INTO THE LUNGS DAILY 12/12/23   Geronimo Amel, MD  glucose blood (ACCU-CHEK AVIVA PLUS) test strip USE TO Check blood glucose 1x DAILY 06/03/23   Shamleffer, Ibtehal Jaralla, MD  glucose blood (ACCU-CHEK GUIDE) test strip USE 1 STRIP ONCE DAILY 04/09/20    Kassie Mallick, MD  glucose blood test strip USE 1 STRIP ONCE DAILY 02/29/20   Kassie Mallick, MD  methocarbamol  (ROBAXIN ) 500 MG tablet Take 1 tablet (500 mg total) by mouth every 8 (eight) hours as needed for muscle spasms. 08/19/22   Elnor Lauraine BRAVO, NP  OXYGEN  Inhale 2-3 L into the lungs continuous. When exerting self    [provider]  Potassium Chloride  ER 20 MEQ TBCR Take 1 tablet by mouth every morning. 01/07/23   [provider]  roflumilast  (DALIRESP ) 500 MCG TABS tablet TAKE ONE TABLET BY MOUTH EVERY MORNING 11/25/22   Geronimo Amel, MD  rosuvastatin  (CRESTOR ) 40 MG tablet TAKE ONE TABLET BY MOUTH EVERYDAY AT BEDTIME 11/24/22   Burnard Debby LABOR, MD  SitaGLIPtin -MetFORMIN  HCl (JANUMET  XR) 50-1000 MG TB24 Take 1 tablet by mouth daily before supper. 09/20/23   Shamleffer, Ibtehal Jaralla, MD  spironolactone  (ALDACTONE ) 25 MG tablet TAKE ONE TABLET BY MOUTH ONCE DAILY 11/24/22   Burnard Debby LABOR, MD  thiamine  (VITAMIN B1) 100 MG tablet TAKE 1 TABLET BY MOUTH EVERY OTHER DAY 01/20/23   Joshua Debby CROME, MD

## 2024-03-08 NOTE — Progress Notes (Signed)
 Pharmacy Electrolyte Replacement  Recent Labs:  Recent Labs    03/06/24 1600 03/07/24 0300 03/08/24 0712  K 3.5   < > 2.9*  MG 2.1  --   --   CREATININE 1.21*   < > 1.21*   < > = values in this interval not displayed.    Low Critical Values (K </= 2.5, Phos </= 1, Mg </= 1) Present: None  MD Contacted: n/a  Plan: KCL 40 mEq q4h per tube x 2 doses per protocol   Rankin Sams, PharmD, BCPS, BCCCP Clinical Pharmacist

## 2024-03-08 NOTE — Plan of Care (Signed)
  Problem: Education: Goal: Ability to describe self-care measures that may prevent or decrease complications (Diabetes Survival Skills Education) will improve Outcome: Progressing Goal: Individualized Educational Video(s) Outcome: Progressing   Problem: Coping: Goal: Ability to adjust to condition or change in health will improve Outcome: Progressing   Problem: Fluid Volume: Goal: Ability to maintain a balanced intake and output will improve Outcome: Progressing   Problem: Health Behavior/Discharge Planning: Goal: Ability to identify and utilize available resources and services will improve Outcome: Progressing Goal: Ability to manage health-related needs will improve Outcome: Progressing   Problem: Metabolic: Goal: Ability to maintain appropriate glucose levels will improve Outcome: Progressing   Problem: Nutritional: Goal: Maintenance of adequate nutrition will improve Outcome: Progressing Goal: Progress toward achieving an optimal weight will improve Outcome: Progressing   Problem: Skin Integrity: Goal: Risk for impaired skin integrity will decrease Outcome: Progressing   Problem: Education: Goal: Knowledge of General Education information will improve Description: Including pain rating scale, medication(s)/side effects and non-pharmacologic comfort measures Outcome: Progressing   Problem: Clinical Measurements: Goal: Ability to maintain clinical measurements within normal limits will improve Outcome: Progressing Goal: Will remain free from infection Outcome: Progressing Goal: Respiratory complications will improve Outcome: Progressing   Problem: Activity: Goal: Risk for activity intolerance will decrease Outcome: Progressing   Problem: Nutrition: Goal: Adequate nutrition will be maintained Outcome: Progressing   Problem: Coping: Goal: Level of anxiety will decrease Outcome: Progressing   Problem: Elimination: Goal: Will not experience complications  related to bowel motility Outcome: Progressing Goal: Will not experience complications related to urinary retention Outcome: Progressing   Problem: Pain Managment: Goal: General experience of comfort will improve and/or be controlled Outcome: Progressing   Problem: Safety: Goal: Ability to remain free from injury will improve Outcome: Progressing   Problem: Skin Integrity: Goal: Risk for impaired skin integrity will decrease Outcome: Progressing

## 2024-03-08 NOTE — Evaluation (Signed)
 RT Evaluate and Treat Note  03/08/2024   Breathing is (select one): Worse than normal   The following was found on auscultation (select multiple):  Bilateral Breath Sounds: Diminished;Rhonchi (03/08/24 1117)  R Upper  Breath Sounds: Diminished (03/08/24 1200) L Upper Breath Sounds: Clear (03/08/24 0800) R Lower Breath Sounds: Diminished (03/08/24 0800) L Lower Breath Sounds: Diminished (03/08/24 0800)    Cough Assessment: Cough: None (03/08/24 0800)    Most Recent Chest Xray:... (No results found.    The following medications and/or interventions were ordered/changed/discontinued as part of the Respiratory Treatment protocol:   Medication Changes: No changes   Airway Clearance Changes: No changes   Oxygen  Therapy Changes: No changes

## 2024-03-08 NOTE — Progress Notes (Signed)
 eLink Physician-Brief Progress Note Patient Name: Victoria [name] DOB: 02-04-1946 MRN: [id number]   Date of Service  03/08/2024  HPI/Events of Note  Remains intermittently hypertensive  eICU Interventions  Renew hydralazine  order   0606 - TG 770, if she fails SBT today, will likely need to switch off propofol   Intervention Category Intermediate Interventions: Hypertension - evaluation and management  [name] 03/08/2024, 8:26 PM

## 2024-03-09 ENCOUNTER — Inpatient Hospital Stay (HOSPITAL_COMMUNITY)

## 2024-03-09 DIAGNOSIS — A419 Sepsis, unspecified organism: Secondary | ICD-10-CM | POA: Diagnosis not present

## 2024-03-09 DIAGNOSIS — J9601 Acute respiratory failure with hypoxia: Secondary | ICD-10-CM | POA: Diagnosis not present

## 2024-03-09 DIAGNOSIS — E119 Type 2 diabetes mellitus without complications: Secondary | ICD-10-CM | POA: Diagnosis not present

## 2024-03-09 DIAGNOSIS — G9341 Metabolic encephalopathy: Secondary | ICD-10-CM | POA: Diagnosis not present

## 2024-03-09 LAB — BASIC METABOLIC PANEL WITH GFR
Anion gap: 15 (ref 5–15)
Anion gap: 16 — ABNORMAL HIGH (ref 5–15)
BUN: 63 mg/dL — ABNORMAL HIGH (ref 8–23)
BUN: 70 mg/dL — ABNORMAL HIGH (ref 8–23)
CO2: 39 mmol/L — ABNORMAL HIGH (ref 22–32)
CO2: 39 mmol/L — ABNORMAL HIGH (ref 22–32)
Calcium: 8.9 mg/dL (ref 8.9–10.3)
Calcium: 9.2 mg/dL (ref 8.9–10.3)
Chloride: 88 mmol/L — ABNORMAL LOW (ref 98–111)
Chloride: 91 mmol/L — ABNORMAL LOW (ref 98–111)
Creatinine, Ser: 1.14 mg/dL — ABNORMAL HIGH (ref 0.44–1.00)
Creatinine, Ser: 1.34 mg/dL — ABNORMAL HIGH (ref 0.44–1.00)
GFR, Estimated: 49 mL/min — ABNORMAL LOW (ref >60)
GFR, Estimated: 40 mL/min — ABNORMAL LOW (ref >60)
Glucose, Bld: 197 mg/dL — ABNORMAL HIGH (ref 70–99)
Glucose, Bld: 231 mg/dL — ABNORMAL HIGH (ref 70–99)
Potassium: 2.7 mmol/L — CL (ref 3.5–5.1)
Potassium: 3.5 mmol/L (ref 3.5–5.1)
Sodium: 142 mmol/L (ref 135–145)
Sodium: 146 mmol/L — ABNORMAL HIGH (ref 135–145)

## 2024-03-09 LAB — GLUCOSE, CAPILLARY
Glucose-Capillary: 161 mg/dL — ABNORMAL HIGH (ref 70–99)
Glucose-Capillary: 197 mg/dL — ABNORMAL HIGH (ref 70–99)
Glucose-Capillary: 200 mg/dL — ABNORMAL HIGH (ref 70–99)
Glucose-Capillary: 208 mg/dL — ABNORMAL HIGH (ref 70–99)
Glucose-Capillary: 215 mg/dL — ABNORMAL HIGH (ref 70–99)
Glucose-Capillary: 223 mg/dL — ABNORMAL HIGH (ref 70–99)

## 2024-03-09 LAB — TRIGLYCERIDES
Triglycerides: 770 mg/dL — ABNORMAL HIGH (ref <150)
Triglycerides: 572 mg/dL — ABNORMAL HIGH (ref <150)

## 2024-03-09 LAB — CBC
HCT: 26.8 % — ABNORMAL LOW (ref 36.0–46.0)
Hemoglobin: 8.4 g/dL — ABNORMAL LOW (ref 12.0–15.0)
MCH: 28.7 pg (ref 26.0–34.0)
MCHC: 31.3 g/dL (ref 30.0–36.0)
MCV: 91.5 fL (ref 80.0–100.0)
Platelets: 266 K/uL (ref 150–400)
RBC: 2.93 MIL/uL — ABNORMAL LOW (ref 3.87–5.11)
RDW: 16 % — ABNORMAL HIGH (ref 11.5–15.5)
WBC: 12.5 K/uL — ABNORMAL HIGH (ref 4.0–10.5)
nRBC: 0 % (ref 0.0–0.2)

## 2024-03-09 LAB — CULTURE, RESPIRATORY W GRAM STAIN

## 2024-03-09 LAB — MAGNESIUM: Magnesium: 2.2 mg/dL (ref 1.7–2.4)

## 2024-03-09 LAB — PHOSPHORUS: Phosphorus: 4.6 mg/dL (ref 2.5–4.6)

## 2024-03-09 MED ORDER — POTASSIUM CHLORIDE 20 MEQ PO PACK
40.0000 meq | PACK | ORAL | Status: DC
  Administered 2024-03-09: 40 meq
  Filled 2024-03-09: qty 2

## 2024-03-09 MED ORDER — STERILE WATER FOR INJECTION IJ SOLN
INTRAMUSCULAR | Status: AC
  Administered 2024-03-09: 5 mL
  Filled 2024-03-09: qty 10

## 2024-03-09 MED ORDER — INSULIN GLARGINE-YFGN 100 UNIT/ML ~~LOC~~ SOLN
30.0000 [IU] | Freq: Every day | SUBCUTANEOUS | Status: DC
  Administered 2024-03-09 – 2024-03-11 (×3): 30 [IU] via SUBCUTANEOUS
  Filled 2024-03-09 (×3): qty 0.3

## 2024-03-09 MED ORDER — ACETAZOLAMIDE SODIUM 500 MG IJ SOLR: 500.0000 mg | Freq: Once | INTRAMUSCULAR | Status: DC

## 2024-03-09 MED ORDER — POTASSIUM CHLORIDE 20 MEQ PO PACK
40.0000 meq | PACK | ORAL | Status: AC
  Administered 2024-03-09 (×2): 40 meq
  Filled 2024-03-09 (×2): qty 2

## 2024-03-09 MED ORDER — INSULIN ASPART 100 UNIT/ML IJ SOLN
5.0000 [IU] | INTRAMUSCULAR | Status: DC
  Administered 2024-03-09 – 2024-03-11 (×12): 5 [IU] via SUBCUTANEOUS

## 2024-03-09 MED ORDER — DEXMEDETOMIDINE HCL IN NACL 400 MCG/100ML IV SOLN
0.0000 ug/kg/h | INTRAVENOUS | Status: DC
  Administered 2024-03-09: 0.4 ug/kg/h via INTRAVENOUS
  Administered 2024-03-09: 0.6 ug/kg/h via INTRAVENOUS
  Administered 2024-03-09 – 2024-03-10 (×2): 0.7 ug/kg/h via INTRAVENOUS
  Filled 2024-03-09 (×4): qty 100

## 2024-03-09 MED ORDER — STERILE WATER FOR INJECTION IJ SOLN
INTRAMUSCULAR | Status: AC
Start: 2024-03-09 — End: 2024-03-09
  Administered 2024-03-09: 10 mL
  Filled 2024-03-09: qty 10

## 2024-03-09 MED ORDER — SODIUM CHLORIDE 0.9 % IV SOLN
12.0000 g | INTRAVENOUS | Status: DC
  Administered 2024-03-09 – 2024-03-10 (×2): 12 g via INTRAVENOUS
  Filled 2024-03-09 (×3): qty 48

## 2024-03-09 MED ORDER — NICARDIPINE HCL IN NACL 20-0.86 MG/200ML-% IV SOLN
3.0000 mg/h | INTRAVENOUS | Status: DC
  Administered 2024-03-10 – 2024-03-11 (×5): 5 mg/h via INTRAVENOUS
  Administered 2024-03-11: 8 mg/h via INTRAVENOUS
  Administered 2024-03-11: 3 mg/h via INTRAVENOUS
  Administered 2024-03-11: 8 mg/h via INTRAVENOUS
  Filled 2024-03-09 (×10): qty 200

## 2024-03-09 MED ORDER — PENICILLIN G POTASSIUM 20000000 UNITS IJ SOLR: 18.0000 10*6.[IU] | INTRAVENOUS | Status: DC

## 2024-03-09 MED ORDER — SODIUM CHLORIDE 0.9 % IV SOLN
2.0000 g | Freq: Two times a day (BID) | INTRAVENOUS | Status: DC
  Administered 2024-03-09: 2 g via INTRAVENOUS
  Filled 2024-03-09: qty 20

## 2024-03-09 MED ORDER — CLEVIDIPINE BUTYRATE 0.5 MG/ML IV EMUL: 0.0000 mg/h | INTRAVENOUS | Status: DC

## 2024-03-09 MED ORDER — ACETAZOLAMIDE SODIUM 500 MG IJ SOLR
500.0000 mg | Freq: Two times a day (BID) | INTRAMUSCULAR | Status: AC
  Administered 2024-03-09 (×2): 500 mg via INTRAVENOUS
  Filled 2024-03-09 (×2): qty 500

## 2024-03-09 NOTE — Progress Notes (Addendum)
 NAME:  Victoria [name], MRN:  [id number], DOB:  1946/01/29, LOS: 10 ADMISSION DATE:  02/27/2024, CONSULTATION DATE:  02/28/24 REFERRING MD:  Haze CHIEF COMPLAINT:  Acute respiratory failure    History of Present Illness:  78 yo F with PMH COPD on 2L O2 at home, OSA, T2DM, CKD 3A who presented to [facility] on 10/14 with altered mental status found to have multiple embolic strokes and strep bacteremia, initially arriving with increased lethargy and respiratory distress requiring invasive mechanical ventilation.  [name] last known normal 10/10, was found down by EMS on 10/13.  Receiving empirical treatment for endocarditis per ID.  There is also concern for HAP, with Staph aureus growing on sputum culture thus far.  Patient will transition back to ceftriaxone monotherapy.  Patient remains overloaded, receiving aggressive diuresis with overall stable kidney function.  Patient remains intubated, on fentanyl  and propofol  drip, no longer on pressors.  Pertinent  Medical History   Past Medical History:  Diagnosis Date   Abdominal aneurysm    Dr. Harvey follows lLOV 2 ''17 per pt around 2 cm   Anemia    as a child   Arthritis    left ankle, right knee, right SI joint, wrists, lower back   Breast cancer in female [facility])    Left   COPD (chronic obstructive pulmonary disease) (HCC)    ephysema-Dr. 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 Eletha in OR [place]   Sleep apnea    cpap used sometimes, uses oxygen  concentrator 2 l/m nasally bedtime   Varicella as child     Significant Hospital  Events: Including procedures, antibiotic start and stop dates in addition to other pertinent events   10/14: Admitted to ICU requiring IMV 10/17: No embolic source seen on TEE. Treating for presumed infective endocarditis.  10/18: Failing PSV weans with agitation. Transient episodes of bradycardia.  10/21: Transition from CTX to Penicillin . Trialed transition from propofol  to precedex but did not tolerate.  10/22: Initiate Zosyn for empiric HAP treatment. Stopped penicillin . Vancomycin added for CNS coverage.  10/23: D/c Zosyn and Vanc. Initiate Cefepime.  10/24: D/c Cefepime. Initiate CTX monotherapy.  Staph aureus growing on sputum culture.  Interim History / Subjective:  Afebrile overnight. SBP intermittently to 160-170. DBP 30-50s. Map >65. HR 70-80. Satting well on vent (420TV, 5PEEP, 40%,  18 RR)  I/O 4L UOP in last 24h Total + 8.8L since admission   Labs Cr 1.25 > 1.14  K 2.7 Bicarb: 37 > 39  GFR 44 > 49  Objective    Blood pressure (!) 144/54, pulse 88, temperature 99.5 F (37.5 C), resp. rate 20, height 5' 4.5 (1.638 m), weight 99.3 kg, SpO2 96%. CVP:  [4 mmHg-6 mmHg] 4 mmHg  Vent Mode: PRVC FiO2 (%):  [40 %] 40 % Set Rate:  [18 bmp] 18 bmp Vt Set:  [420 mL] 420 mL PEEP:  [5 cmH20] 5 cmH20 Plateau Pressure:  [14 cmH20-16 cmH20] 16 cmH20  Intake/Output Summary (Last 24 hours) at 03/09/2024 0852 Last data filed at 03/09/2024 0700 Gross per 24 hour  Intake 2693.63 ml  Output 4240 ml  Net -1546.37 ml   Filed Weights   03/07/24 0411 03/08/24 0700 03/09/24 0444  Weight: 101.8 kg 102.9 kg 99.3 kg    Examination: General: Sedated, intubated Lungs: Intubated. CTAB bilaterally, though diminished. No increased WOB.  Cardiovascular: S1/S2. No extra heart sounds. Warm and well-perfused.  Abdomen: soft, non-distended.  Extremities: Edematous upper and lower extremities.  Neuro: Sedated. Not responsive to pain.   Resolved problem list   Assessment and Plan    #Respiratory failure #Concern for HAP  #Hx severe COPD Afebrile overnight.  Continues to diurese with IV Lasix .  Weight downtrending, remains +8 L since admission.  Treating for HAP.  Sputum culture with Staph aureus, 10/22 MRSA swab negative.  Bicarb remains elevated. - Continue IV Lasix  160 Q8 - Acetazolamide 500 x 2 today - Replete K (40mEq x3) with BMP check this afternoon - Transition from cefepime to CTX monotherapy - Trial transition from propofol  to Precedex today, with goal of extubation - Daily weights - Trend kidney function - Continue Brovana , Pulmicort , tubular, Daliresp   #Acute metabolic encephalopathy #Strep bacteremia  presumed endocarditis  #Embolic stroke Multiple embolic strokes, MRA normal. CSF non-infectious. TEE without source of embolism, treating for endocarditis per ID.  Repeat blood culture 10/15 no growth 5 days. - Ceftriaxone monotherapy per above - Home aspirin  81  #AKI Elevated creatinine stable 1.21 > 1.14 with continued diuresis.  Remains fluid overloaded.  - Continue diuresis as above  - Trend kidney function  #T2DM Blood sugars elevated 140-200 overnight.  Required 44 units SAI with 25 units LAI yesterday. - Increase to 30 units LAI - Increase to 5 units SAI q4 with tube feeds - Mod SSI  #PAD - Currently:  Fentanyl  200 mcg/hr, Propofol  30 mcg/kg/min, Oxycodone 10 q6, Seroquel 25 nightly - Trial transition from propofol  to Precedex today, with goal of extubation - Repeat triglycerides this afternoon - adjust propofol  as indicated  - Wean sedation as able      Stress ulcer prophylaxis: PPI VTE prophylaxis: Lovenox     Labs   CBC: Recent Labs  Lab 03/02/24 0908 03/03/24 0217 03/04/24 0223 03/05/24 0300 03/06/24 0340 03/07/24 0300 03/09/24 0500  WBC 10.6*   < > 9.4 9.1 10.2 12.4* 12.5*  NEUTROABS 9.0*  --   --   --   --   --   --   HGB 9.9*   < > 9.7* 9.0* 9.0* 9.2* 8.4*  HCT 32.2*   < > 31.8* 30.2* 30.4* 29.7* 26.8*  MCV  89.4   < > 90.6 92.9 91.6 89.5 91.5  PLT 165   < > 133* 189 223 246 266   < > = values in this interval not displayed.    Basic Metabolic Panel: Recent Labs  Lab 03/03/24 0217 03/04/24 0223 03/05/24 0300 03/05/24 1827 03/06/24 0340 03/06/24 1600 03/07/24 0300 03/08/24 0712 03/08/24 1400 03/09/24 0500  NA 139 141   < > 143   < > 144 142 145 145 142  K 4.2 4.0   < > 4.6   < > 3.5 3.8 2.9* 3.2* 2.7*  CL 102 103   < > 106   < > 98 97* 95* 95* 88*  CO2 25 27   < > 26   < > 34* 34* 38* 37* 39*  GLUCOSE 166* 148*   < > 173*   < >  242* 201* 184* 212* 197*  BUN 24* 26*   < > 36*   < > 46* 48* 54* 59* 63*  CREATININE 0.88 1.04*   < > 1.22*   < > 1.21* 1.14* 1.21* 1.25* 1.14*  CALCIUM  8.2* 8.3*   < > 7.9*   < > 9.2 8.6* 8.6* 8.8* 8.9  MG 2.3 2.0  --  2.1  --  2.1  --   --   --  2.2  PHOS 2.5 3.5  --   --   --   --   --   --   --  4.6   < > = values in this interval not displayed.   GFR: Estimated Creatinine Clearance: 45.5 mL/min (A) (by C-G formula based on SCr of 1.14 mg/dL (H)). Recent Labs  Lab 03/05/24 0300 03/06/24 0340 03/07/24 0300 03/07/24 1242 03/09/24 0500  PROCALCITON  --   --   --  0.80  --   WBC 9.1 10.2 12.4*  --  12.5*    Liver Function Tests: Recent Labs  Lab 03/02/24 0908  AST 23  ALT 16  ALKPHOS 93  BILITOT 0.3  PROT 5.8*  ALBUMIN 2.1*   No results for input(s): LIPASE, AMYLASE in the last 168 hours. No results for input(s): AMMONIA in the last 168 hours.  ABG    Component Value Date/Time   HCO3 27.5 02/27/2024 2341   TCO2 29 02/27/2024 2341   O2SAT 96 02/27/2024 2341     Coagulation Profile: No results for input(s): INR, PROTIME in the last 168 hours.  Cardiac Enzymes: Recent Labs  Lab 03/02/24 0908 03/03/24 0217  CKTOTAL 389* 226    HbA1C: Hemoglobin A1C  Date/Time Value Ref Range Status  01/25/2023 12:50 PM 6.1 (A) 4.0 - 5.6 % Final  07/19/2022 01:20 PM 7.2 (A) 4.0 - 5.6 % Final   Hgb A1c MFr Bld  Date/Time Value  Ref Range Status  02/28/2024 06:00 AM 7.0 (H) 4.8 - 5.6 % Final    Comment:    (NOTE) Diagnosis of Diabetes The following HbA1c ranges recommended by the American Diabetes Association (ADA) may be used as an aid in the diagnosis of diabetes mellitus.  Hemoglobin             Suggested A1C NGSP%              Diagnosis  <5.7                   Non Diabetic  5.7-6.4                Pre-Diabetic  >6.4                   Diabetic  <7.0                   Glycemic control for                       adults with diabetes.    01/25/2021 12:08 PM 6.3 (H) 4.8 - 5.6 % Final    Comment:    (NOTE) Pre diabetes:          5.7%-6.4%  Diabetes:              >6.4%  Glycemic control for   <7.0% adults with diabetes     CBG: Recent Labs  Lab 03/08/24 1516 03/08/24 1945 03/08/24 2350 03/09/24 0327 03/09/24 0741  GLUCAP 224* 170*  142* 208* 161*    Past Medical History:  She,  has a past medical history of Abdominal aneurysm, Anemia, Arthritis, Breast cancer in female [facility]), COPD (chronic obstructive pulmonary disease) (HCC), Dyspnea, Headache, Heart murmur, Hyperlipidemia, Hypertension, Multiple thyroid  nodules, Murmur, cardiac (1950), Osteoporosis, Pneumonia, Pre-diabetes, Requires continuous at home supplemental oxygen , Sebaceous cyst, Sleep apnea, and Varicella (as child).   Surgical History:   Past Surgical History:  Procedure Laterality Date   APPENDECTOMY     2008   BREAST LUMPECTOMY Left 08/31/2017   BREAST LUMPECTOMY WITH RADIOACTIVE SEED LOCALIZATION Left 08/10/2017   Procedure: BREAST LUMPECTOMY WITH RADIOACTIVE SEED LOCALIZATION;  Surgeon: Ebbie Cough, MD;  Location: [facility] OR;  Service: General;  Laterality: Left;   CESAREAN SECTION     1972   COLONOSCOPY     COLONOSCOPY WITH PROPOFOL  N/A 04/16/2016   Procedure: COLONOSCOPY WITH PROPOFOL ;  Surgeon: Belvie Just, MD;  Location: [facility] END[facility]COPY;  Service: Endoscopy;  Laterality: N/A;   CYST REMOVAL NECK Left 04/13/2016    Procedure: EXCISION OF SEBACEOUS CYST LEFT POSTERIOR NECK;  Surgeon: Krystal Spinner, MD;  Location: [facility] OR;  Service: General;  Laterality: Left;   Excision of Pelvic Absess, Right Ovary     2008   RE-EXCISION OF BREAST CANCER,SUPERIOR MARGINS Left 08/31/2017   Procedure: RE-EXCISION OF LEFT  BREAST MARGINS ERAS PATHWAY;  Surgeon: Ebbie Cough, MD;  Location: [facility] OR;  Service: General;  Laterality: Left;   TUBAL LIGATION  1980     Social History:   reports that she quit smoking about 12 years ago. Her smoking use included cigarettes. She started smoking about 62 years ago. She has a 50 pack-year smoking history. She has never used smokeless tobacco. She reports current alcohol use. She reports that she does not use drugs.   Family History:  Her family history includes Alzheimer's disease in her brother and father; Breast cancer in her paternal grandmother; Diabetes in her brother; Heart disease in her mother; Hyperlipidemia in her brother and brother; Hypertension in her brother, brother, and mother; Stroke (age of onset: 38) in her father.   Allergies Allergies  Allergen Reactions   Benicar  [Olmesartan ] Swelling    Swelling of face and arms    [name]  [Valsartan ] Swelling    Swelling of face and arms    Hydrocodone -Acetaminophen  Nausea And Vomiting    Severe vomiting   Lisinopril  Cough   Monosodium Glutamate Other (See Comments)    Facial swelling per pt   Codeine Other (See Comments)    jittery   Lead Acetate Rash   Nickel Rash    Severe rash to infection: pt is allergic to all metals other than sterling silver or gold jewelry.      Home Medications  Prior to Admission medications   Medication Sig Start Date End Date Taking? Authorizing Provider  Accu-Chek Softclix Lancets lancets Use as instructed to check blood sugar 1x daily 03/30/23   Shamleffer, Ibtehal Jaralla, MD  acetaminophen  (TYLENOL ) 500 MG tablet Take 1,000 mg by mouth every 6 (six) hours as needed.    [provider]  albuterol  (PROVENTIL ) (2.5 MG/3ML) 0.083% nebulizer solution Take 3 mLs (2.5 mg total) by nebulization every 6 (six) hours as needed for wheezing or shortness of breath. 12/31/21   Parrett, Madelin RAMAN, NP  Albuterol  Sulfate (PROAIR  RESPICLICK) 108 (90 Base) MCG/ACT AEPB Inhale 1-2 puffs into the lungs every 6 (six) hours as needed (for wheezing/shortness of breath). 03/31/20   Geronimo Amel, MD  amLODipine  (NORVASC )  10 MG tablet TAKE ONE TABLET BY MOUTH ONCE DAILY 11/24/22   Burnard Debby LABOR, MD  aspirin  EC 81 MG tablet Take 1 tablet (81 mg total) by mouth daily. 07/23/19   Kroeger, Krista M., PA-C  Blood Glucose Monitoring Suppl (ACCU-CHEK AVIVA PLUS) w/Device KIT Use to monitor glucose levels once per day; E11.9 06/10/23   Shamleffer, Donell Cardinal, MD  carvedilol  (COREG ) 6.25 MG tablet TAKE ONE TABLET BY MOUTH EVERY MORNING and TAKE ONE TABLET BY MOUTH EVERYDAY AT BEDTIME 11/24/22   Burnard Debby LABOR, MD  cetirizine  (ZYRTEC ) 10 MG tablet Take 10 mg by mouth every morning. 12/28/23   [provider]  Cholecalciferol (VITAMIN D ) 50 MCG (2000 UT) tablet Take 2,000 Units by mouth daily. 02/07/24   [provider]  Cholecalciferol (VITAMIN D3) 125 MCG (5000 UT) CAPS TAKE ONE CAPSULE BY MOUTH ONCE DAILY 12/29/22   Joshua Debby CROME, MD  dapagliflozin  propanediol (FARXIGA ) 10 MG TABS tablet Take 1 tablet (10 mg total) by mouth daily. 09/20/23   Shamleffer, Ibtehal Jaralla, MD  denosumab  (PROLIA ) 60 MG/ML SOSY injection Inject 60 mg into the skin every 6 (six) months. 06/16/22   Joshua Debby CROME, MD  FEROSUL 325 (65 Fe) MG tablet Take 325 mg by mouth 3 (three) times a week. 08/09/23   [provider]  Fluticasone -Umeclidin-Vilant (TRELEGY ELLIPTA ) 100-62.5-25 MCG/ACT AEPB INHALE ONE (1) PUFF BY MOUTH AND INTO THE LUNGS DAILY 12/12/23   Geronimo Amel, MD  glucose blood (ACCU-CHEK AVIVA PLUS) test strip USE TO Check blood glucose 1x DAILY 06/03/23   Shamleffer, Ibtehal Jaralla, MD   glucose blood (ACCU-CHEK GUIDE) test strip USE 1 STRIP ONCE DAILY 04/09/20   Kassie Mallick, MD  glucose blood test strip USE 1 STRIP ONCE DAILY 02/29/20   Kassie Mallick, MD  methocarbamol  (ROBAXIN ) 500 MG tablet Take 1 tablet (500 mg total) by mouth every 8 (eight) hours as needed for muscle spasms. 08/19/22   Elnor Lauraine BRAVO, NP  OXYGEN  Inhale 2-3 L into the lungs continuous. When exerting self    [provider]  Potassium Chloride  ER 20 MEQ TBCR Take 1 tablet by mouth every morning. 01/07/23   [provider]  roflumilast  (DALIRESP ) 500 MCG TABS tablet TAKE ONE TABLET BY MOUTH EVERY MORNING 11/25/22   Geronimo Amel, MD  rosuvastatin  (CRESTOR ) 40 MG tablet TAKE ONE TABLET BY MOUTH EVERYDAY AT BEDTIME 11/24/22   Burnard Debby LABOR, MD  SitaGLIPtin -MetFORMIN  HCl (JANUMET  XR) 50-1000 MG TB24 Take 1 tablet by mouth daily before supper. 09/20/23   Shamleffer, Ibtehal Jaralla, MD  spironolactone  (ALDACTONE ) 25 MG tablet TAKE ONE TABLET BY MOUTH ONCE DAILY 11/24/22   Burnard Debby LABOR, MD  thiamine  (VITAMIN B1) 100 MG tablet TAKE 1 TABLET BY MOUTH EVERY OTHER DAY 01/20/23   Joshua Debby CROME, MD

## 2024-03-09 NOTE — Plan of Care (Signed)
   Problem: Fluid Volume: Goal: Ability to maintain a balanced intake and output will improve Outcome: Progressing   Problem: Skin Integrity: Goal: Risk for impaired skin integrity will decrease Outcome: Progressing   Problem: Tissue Perfusion: Goal: Adequacy of tissue perfusion will improve Outcome: Progressing

## 2024-03-09 NOTE — Progress Notes (Signed)
 Patient was transported to MRI & back to 3M10 without any complications.

## 2024-03-09 NOTE — Evaluation (Signed)
 RT Evaluate and Treat Note  03/09/2024   Breathing is (select one): Worse than normal   The following was found on auscultation (select multiple):  Bilateral Breath Sounds: Diminished (03/09/24 0709)  R Upper  Breath Sounds: Diminished (03/09/24 0350) L Upper Breath Sounds: Diminished (03/09/24 0350) R Lower Breath Sounds: Diminished (03/09/24 0350) L Lower Breath Sounds: Diminished (03/09/24 0350)    Cough Assessment: Cough: None (03/08/24 2000)    Most Recent Chest Xray:... (No results found.    The following medications and/or interventions were ordered/changed/discontinued as part of the Respiratory Treatment protocol:   Medication Changes: No changes   Airway Clearance Changes: No changes   Oxygen  Therapy Changes: No changes

## 2024-03-09 NOTE — TOC Progression Note (Signed)
 Transition of Care [facility]) - Progression Note    Patient Details  Name: Victoria [name] MRN: [id number] Date of Birth: 1945-06-23  Transition of [facility]) CM/SW Contact  Tom-Johnson, Harvest Muskrat, RN Phone Number: 03/09/2024, 9:16 AM  Clinical Narrative:     Patient continues to be intubated and sedated, wean as able. On IV abx for HAP and Endocarditis. Sputum cx positive with Staph Aureus, ID following. Continues to diurese with IV Lasix .  Patient not Medically ready for discharge.  CM will continue to follow as patient progresses with care towards discharge.                 Expected Discharge Plan and Services                                               Social Drivers of Health (SDOH) Interventions SDOH Screenings   Food Insecurity: Patient Unable To Answer (03/05/2024)  Housing: Unknown (03/05/2024)  Transportation Needs: Patient Unable To Answer (03/05/2024)  Utilities: Patient Unable To Answer (03/05/2024)  Alcohol Screen: Low Risk  (06/02/2022)  Depression (PHQ2-9): Low Risk  (11/24/2022)  Financial Resource Strain: Low Risk  (06/02/2022)  Physical Activity: Inactive (06/02/2022)  Social Connections: Patient Unable To Answer (03/05/2024)  Stress: No Stress Concern Present (06/02/2022)  Tobacco Use: Medium Risk (02/28/2024)    Readmission Risk Interventions     No data to display

## 2024-03-09 NOTE — Progress Notes (Signed)
 [facility] ADULT ICU REPLACEMENT PROTOCOL   The patient does apply for the [facility] Adult ICU Electrolyte Replacment Protocol based on the criteria listed below:   1.Exclusion criteria: TCTS, ECMO, Dialysis, and Myasthenia Gravis patients 2. Is GFR >/= 30 ml/min? Yes.    [name] GFR today is 49 3. Is SCr </= 2? Yes.   [name] SCr is 1.14 mg/dL 4. Did SCr increase >/= 0.5 in 24 hours? No. 5.Pt's weight >40kg  Yes.   6. Abnormal electrolyte(s): K+ 2.7  7. Electrolytes replaced per protocol 8.  Call MD STAT for K+ </= 2.5, Phos </= 1, or Mag </= 1 Physician:  Haze Smalls D Ouedraogo 03/09/2024 6:00 AM

## 2024-03-10 ENCOUNTER — Inpatient Hospital Stay (HOSPITAL_COMMUNITY)

## 2024-03-10 DIAGNOSIS — G9341 Metabolic encephalopathy: Secondary | ICD-10-CM | POA: Diagnosis not present

## 2024-03-10 DIAGNOSIS — A419 Sepsis, unspecified organism: Secondary | ICD-10-CM | POA: Diagnosis not present

## 2024-03-10 DIAGNOSIS — J9601 Acute respiratory failure with hypoxia: Secondary | ICD-10-CM | POA: Diagnosis not present

## 2024-03-10 DIAGNOSIS — E119 Type 2 diabetes mellitus without complications: Secondary | ICD-10-CM | POA: Diagnosis not present

## 2024-03-10 LAB — BASIC METABOLIC PANEL WITH GFR
Anion gap: 15 (ref 5–15)
BUN: 85 mg/dL — ABNORMAL HIGH (ref 8–23)
CO2: 39 mmol/L — ABNORMAL HIGH (ref 22–32)
Calcium: 8.7 mg/dL — ABNORMAL LOW (ref 8.9–10.3)
Chloride: 92 mmol/L — ABNORMAL LOW (ref 98–111)
Creatinine, Ser: 1.63 mg/dL — ABNORMAL HIGH (ref 0.44–1.00)
GFR, Estimated: 32 mL/min — ABNORMAL LOW (ref >60)
Glucose, Bld: 241 mg/dL — ABNORMAL HIGH (ref 70–99)
Potassium: 3 mmol/L — ABNORMAL LOW (ref 3.5–5.1)
Sodium: 146 mmol/L — ABNORMAL HIGH (ref 135–145)

## 2024-03-10 LAB — CBC
HCT: 30 % — ABNORMAL LOW (ref 36.0–46.0)
Hemoglobin: 8.8 g/dL — ABNORMAL LOW (ref 12.0–15.0)
MCH: 27.7 pg (ref 26.0–34.0)
MCHC: 29.3 g/dL — ABNORMAL LOW (ref 30.0–36.0)
MCV: 94.3 fL (ref 80.0–100.0)
Platelets: 297 K/uL (ref 150–400)
RBC: 3.18 MIL/uL — ABNORMAL LOW (ref 3.87–5.11)
RDW: 16.4 % — ABNORMAL HIGH (ref 11.5–15.5)
WBC: 15.6 K/uL — ABNORMAL HIGH (ref 4.0–10.5)
nRBC: 0 % (ref 0.0–0.2)

## 2024-03-10 LAB — GLUCOSE, CAPILLARY
Glucose-Capillary: 146 mg/dL — ABNORMAL HIGH (ref 70–99)
Glucose-Capillary: 233 mg/dL — ABNORMAL HIGH (ref 70–99)
Glucose-Capillary: 192 mg/dL — ABNORMAL HIGH (ref 70–99)
Glucose-Capillary: 133 mg/dL — ABNORMAL HIGH (ref 70–99)
Glucose-Capillary: 177 mg/dL — ABNORMAL HIGH (ref 70–99)
Glucose-Capillary: 208 mg/dL — ABNORMAL HIGH (ref 70–99)

## 2024-03-10 LAB — MAGNESIUM: Magnesium: 2.5 mg/dL — ABNORMAL HIGH (ref 1.7–2.4)

## 2024-03-10 LAB — PHOSPHORUS: Phosphorus: 6.4 mg/dL — ABNORMAL HIGH (ref 2.5–4.6)

## 2024-03-10 MED ORDER — PROPOFOL 1000 MG/100ML IV EMUL
0.0000 ug/kg/min | INTRAVENOUS | Status: DC
  Administered 2024-03-10: 40 ug/kg/min via INTRAVENOUS
  Administered 2024-03-10 (×2): 50 ug/kg/min via INTRAVENOUS
  Administered 2024-03-10: 10 ug/kg/min via INTRAVENOUS
  Administered 2024-03-11: 50 ug/kg/min via INTRAVENOUS
  Administered 2024-03-11: 30 ug/kg/min via INTRAVENOUS
  Filled 2024-03-10 (×4): qty 100

## 2024-03-10 MED ORDER — POTASSIUM CHLORIDE 20 MEQ PO PACK
40.0000 meq | PACK | ORAL | Status: AC
  Administered 2024-03-10 (×2): 40 meq
  Filled 2024-03-10 (×2): qty 2

## 2024-03-10 MED ORDER — NOREPINEPHRINE 4 MG/250ML-% IV SOLN: 0.0000 ug/min | INTRAVENOUS | Status: DC

## 2024-03-10 MED ORDER — ALBUMIN HUMAN 25 % IV SOLN
25.0000 g | Freq: Four times a day (QID) | INTRAVENOUS | Status: AC
  Administered 2024-03-10 – 2024-03-11 (×4): 25 g via INTRAVENOUS
  Filled 2024-03-10 (×4): qty 100

## 2024-03-10 NOTE — Progress Notes (Signed)
 NAME:  Victoria [name], MRN:  [id number], DOB:  13-Mar-1946, LOS: 11 ADMISSION DATE:  02/27/2024, CONSULTATION DATE:  02/28/24 REFERRING MD:  Haze CHIEF COMPLAINT:  Acute respiratory failure    History of Present Illness:  78 yo F with PMH COPD on 2L O2 at home, OSA, T2DM, CKD 3A who presented to [facility] on [facility]14 with altered mental status found to have multiple embolic strokes and strep bacteremia, initially arriving with increased lethargy and respiratory distress requiring invasive mechanical ventilation.  [name] last known normal 10/10, was found down by EMS on 10/13.  Receiving empirical treatment for endocarditis per ID.  There is also concern for HAP, with Staph aureus growing on sputum culture thus far.  Patient will transition back to ceftriaxone monotherapy.  Patient remains overloaded, receiving aggressive diuresis with overall stable kidney function.  Patient remains intubated, on fentanyl  and propofol  drip, no longer on pressors.  Pertinent  Medical History   Past Medical History:  Diagnosis Date   Abdominal aneurysm    Dr. Harvey follows lLOV 2 ''17 per pt around 2 cm   Anemia    as a child   Arthritis    left ankle, right knee, right SI joint, wrists, lower back   Breast cancer in female [facility])    Left   COPD (chronic obstructive pulmonary disease) (HCC)    ephysema-Dr. 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 Eletha in OR [place]   Sleep apnea    cpap used sometimes, uses oxygen  concentrator 2 l/m nasally bedtime   Varicella as child     Significant Hospital  Events: Including procedures, antibiotic start and stop dates in addition to other pertinent events   10/14: Admitted to ICU requiring IMV 10/17: No embolic source seen on TEE. Treating for presumed infective endocarditis.  10/18: Failing PSV weans with agitation. Transient episodes of bradycardia.  10/21: Transition from CTX to Penicillin . Trialed transition from propofol  to precedex but did not tolerate.  10/22: Initiate Zosyn for empiric HAP treatment. Stopped penicillin . Vancomycin added for CNS coverage.  10/23: D/c Zosyn and Vanc. Initiate Cefepime.  10/24: D/c Cefepime. Initiate CTX monotherapy.  Staph aureus growing on sputum culture. Repeat MRI for continued poor neuro exam. 10/25 family meeting  Interim History / Subjective:  BP up with prop off Cheyne stokes breathing pattern, fights AC unless heavily sedated  Objective    Blood pressure (!) 208/51, pulse 99, temperature 100 F (37.8 C), temperature source Bladder, resp. rate (!) 29, height 5' 4.5 (1.638 m), weight 95.3 kg, SpO2 96%. CVP:  [9 mmHg] 9 mmHg  Vent Mode: PSV;CPAP FiO2 (%):  [40 %] 40 % Set Rate:  [18 bmp] 18 bmp Vt Set:  [420 mL] 420 mL PEEP:  [5 cmH20] 5 cmH20 Pressure Support:  [5 cmH20-10 cmH20] 10 cmH20   Intake/Output Summary (Last 24 hours) at 03/10/2024 0846 Last data filed at 03/10/2024 0800 Gross per 24 hour  Intake 2754.68 ml  Output 2825  ml  Net -70.32 ml   Filed Weights   03/08/24 0700 03/09/24 0444 03/10/24 0500  Weight: 102.9 kg 99.3 kg 95.3 kg    Examination: Some myoclonic type movements RUE RLE, occasional R side withdraw to pain No movement on left Brainstem reflexes including cough/gag/corneals/pupillary/doll's all intact + accessory muscle use with cheyne-stokes breathing pattern on PS, fights AC without heavy sedation  Resolved problem list   Assessment and Plan   Baseline severe COPD with altered mentation for a few days then found down for unknown amount of  time.  Workup has revealed strep bacteremia and scattered infarcts on MRI.  Despite appropriate abx, rescuscitation, mechanical ventilation she remains comatose and failing SBT due to WOB and mental status.  MRI shows new scattered infarcts.  Had some superimposed MSSA PNA + volume overload both treated; holding further diuresis with AKI.  Met with son and daughter in law.  Baseline pretty independent but COPD sounds really severe, breathless with any activity.  Probably dealing with mixed anoxic brain injury plus septic emboli.  We are 11 days in and options would be trach/PEG and giving a month to see if can regain consciousness.  My worry is that we have been giving all supportive care and neuro exam if anything is worse and MRI showing new injuries.  While I suspect her breathing is just related to brain injury I also shared my concern that if there is any part of her left aware that she could be scared and suffering so need to decide on path forward.  Son and patient had talked about this before and she has stated DNR in past and not to prolong things.  Son and DIL are going to talk about next steps, my recommendation is to transition to a comfort-based approach and allow Victoria [name] to go on her own terms.    In meantime, giving some albumin, precedex + fent; may need to add back propofol  if refractory.  Hold further diuresis, continue vent and abx as ordered.  My cc time 45 min independent of procedures.  Rolan Sharps MD PCCM

## 2024-03-10 NOTE — Progress Notes (Signed)
 eLink Physician-Brief Progress Note Patient Name: Victoria [name] DOB: 12-27-1945 MRN: [id number]   Date of Service  03/10/2024  HPI/Events of Note  Respiratory failure secondary to hap, wide pulse pressures, unclear BP goals  eICU Interventions  Norepi for MAP > 60     Intervention Category Intermediate Interventions: Hypotension - evaluation and management  Ellenora Talton 03/10/2024, 6:24 AM

## 2024-03-10 NOTE — Plan of Care (Signed)
  Problem: Education: Goal: Ability to describe self-care measures that may prevent or decrease complications (Diabetes Survival Skills Education) will improve Outcome: Not Progressing   Problem: Coping: Goal: Ability to adjust to condition or change in health will improve Outcome: Not Progressing   Problem: Education: Goal: Knowledge of General Education information will improve Description: Including pain rating scale, medication(s)/side effects and non-pharmacologic comfort measures Outcome: Not Progressing   Problem: Clinical Measurements: Goal: Diagnostic test results will improve Outcome: Not Progressing

## 2024-03-10 NOTE — Evaluation (Signed)
 RT Evaluate and Treat Note  03/10/2024   Breathing is (select one): Worse than normal   The following was found on auscultation (select multiple):  Bilateral Breath Sounds: Diminished (03/10/24 0801)  R Upper  Breath Sounds: Diminished (03/10/24 0801) L Upper Breath Sounds: Diminished (03/10/24 0801) R Lower Breath Sounds: Diminished (03/10/24 0801) L Lower Breath Sounds: Diminished (03/10/24 0801)    Cough Assessment: Cough: None (03/08/24 2000)    Most Recent Chest Xray:... (DG Chest Port 1 View Result Date: 03/10/2024 EXAM: 1 VIEW(S) XRAY OF THE CHEST 03/10/2024 04:59:18 AM COMPARISON: 03/05/2024 CLINICAL HISTORY: FINDINGS: LINES, TUBES AND DEVICES: ETT in place with tip 4.4 cm above the carina. Enteric tube courses through chest to abdomen beyond field-of-view. Right upper extremity PICC with tip terminating over the SVC. LUNGS AND PLEURA: Decreased pleural effusions. Improved bibasilar lung aeration. Diffuse interstitial opacities. Left basilar linear opacity, most consistent with atelectasis. No pneumothorax. HEART AND MEDIASTINUM: Cardiomegaly, likely accentuated by technique. Atherosclerotic calcifications. BONES AND SOFT TISSUES: No acute osseous abnormality. IMPRESSION: 1. Decreased pleural effusions with improved bibasilar aeration. 2. Diffuse interstitial opacities, unchanged 3. Left basilar atelectasis. Electronically signed by: Waddell Calk MD 03/10/2024 05:52 AM EDT RP Workstation: [id number]      The following medications and/or interventions were ordered/changed/discontinued as part of the Respiratory Treatment protocol:   Medication Changes: NONE   Airway Clearance Changes: None   Oxygen  Therapy Changes:None

## 2024-03-11 ENCOUNTER — Inpatient Hospital Stay (HOSPITAL_COMMUNITY)

## 2024-03-11 DIAGNOSIS — E119 Type 2 diabetes mellitus without complications: Secondary | ICD-10-CM | POA: Diagnosis not present

## 2024-03-11 DIAGNOSIS — J9601 Acute respiratory failure with hypoxia: Secondary | ICD-10-CM | POA: Diagnosis not present

## 2024-03-11 DIAGNOSIS — G9341 Metabolic encephalopathy: Secondary | ICD-10-CM | POA: Diagnosis not present

## 2024-03-11 DIAGNOSIS — A419 Sepsis, unspecified organism: Secondary | ICD-10-CM | POA: Diagnosis not present

## 2024-03-11 LAB — CBC
HCT: 21.9 % — ABNORMAL LOW (ref 36.0–46.0)
HCT: 25.7 % — ABNORMAL LOW (ref 36.0–46.0)
HCT: 25.2 % — ABNORMAL LOW (ref 36.0–46.0)
Hemoglobin: 6.7 g/dL — CL (ref 12.0–15.0)
Hemoglobin: 7.7 g/dL — ABNORMAL LOW (ref 12.0–15.0)
Hemoglobin: 7.7 g/dL — ABNORMAL LOW (ref 12.0–15.0)
MCH: 28.5 pg (ref 26.0–34.0)
MCH: 27.5 pg (ref 26.0–34.0)
MCH: 28.1 pg (ref 26.0–34.0)
MCHC: 30.6 g/dL (ref 30.0–36.0)
MCHC: 30 g/dL (ref 30.0–36.0)
MCHC: 30.6 g/dL (ref 30.0–36.0)
MCV: 93.2 fL (ref 80.0–100.0)
MCV: 91.8 fL (ref 80.0–100.0)
MCV: 92 fL (ref 80.0–100.0)
Platelets: 265 K/uL (ref 150–400)
Platelets: 308 K/uL (ref 150–400)
Platelets: 299 K/uL (ref 150–400)
RBC: 2.35 MIL/uL — ABNORMAL LOW (ref 3.87–5.11)
RBC: 2.8 MIL/uL — ABNORMAL LOW (ref 3.87–5.11)
RBC: 2.74 MIL/uL — ABNORMAL LOW (ref 3.87–5.11)
RDW: 16.7 % — ABNORMAL HIGH (ref 11.5–15.5)
RDW: 16.6 % — ABNORMAL HIGH (ref 11.5–15.5)
RDW: 16.5 % — ABNORMAL HIGH (ref 11.5–15.5)
WBC: 11.4 K/uL — ABNORMAL HIGH (ref 4.0–10.5)
WBC: 15.1 K/uL — ABNORMAL HIGH (ref 4.0–10.5)
WBC: 14.5 K/uL — ABNORMAL HIGH (ref 4.0–10.5)
nRBC: 0 % (ref 0.0–0.2)
nRBC: 0 % (ref 0.0–0.2)
nRBC: 0 % (ref 0.0–0.2)

## 2024-03-11 LAB — PROTIME-INR
INR: 1.3 — ABNORMAL HIGH (ref 0.8–1.2)
Prothrombin Time: 16.6 s — ABNORMAL HIGH (ref 11.4–15.2)

## 2024-03-11 LAB — BASIC METABOLIC PANEL WITH GFR
Anion gap: 15 (ref 5–15)
BUN: 87 mg/dL — ABNORMAL HIGH (ref 8–23)
CO2: 39 mmol/L — ABNORMAL HIGH (ref 22–32)
Calcium: 9 mg/dL (ref 8.9–10.3)
Chloride: 99 mmol/L (ref 98–111)
Creatinine, Ser: 1.23 mg/dL — ABNORMAL HIGH (ref 0.44–1.00)
GFR, Estimated: 44 mL/min — ABNORMAL LOW (ref >60)
Glucose, Bld: 206 mg/dL — ABNORMAL HIGH (ref 70–99)
Potassium: 2.4 mmol/L — CL (ref 3.5–5.1)
Sodium: 153 mmol/L — ABNORMAL HIGH (ref 135–145)

## 2024-03-11 LAB — OCCULT BLOOD X 1 CARD TO LAB, STOOL: Fecal Occult Bld: POSITIVE — AB

## 2024-03-11 LAB — GLUCOSE, CAPILLARY
Glucose-Capillary: 190 mg/dL — ABNORMAL HIGH (ref 70–99)
Glucose-Capillary: 222 mg/dL — ABNORMAL HIGH (ref 70–99)
Glucose-Capillary: 265 mg/dL — ABNORMAL HIGH (ref 70–99)

## 2024-03-11 LAB — TRIGLYCERIDES: Triglycerides: 447 mg/dL — ABNORMAL HIGH (ref <150)

## 2024-03-11 LAB — PHOSPHORUS: Phosphorus: 3.7 mg/dL (ref 2.5–4.6)

## 2024-03-11 LAB — MAGNESIUM: Magnesium: 2.5 mg/dL — ABNORMAL HIGH (ref 1.7–2.4)

## 2024-03-11 LAB — APTT: aPTT: 30 s (ref 24–36)

## 2024-03-11 MED ORDER — HYDROMORPHONE HCL 1 MG/ML IJ SOLN
1.0000 mg | Freq: Once | INTRAMUSCULAR | Status: AC
  Administered 2024-03-11: 1 mg via INTRAVENOUS
  Filled 2024-03-11: qty 1

## 2024-03-11 MED ORDER — POTASSIUM CHLORIDE 20 MEQ PO PACK
40.0000 meq | PACK | ORAL | Status: DC
  Administered 2024-03-11: 40 meq
  Filled 2024-03-11: qty 2

## 2024-03-11 MED ORDER — FREE WATER
300.0000 mL | Status: DC
  Administered 2024-03-11: 300 mL

## 2024-03-11 MED ORDER — PANTOPRAZOLE SODIUM 40 MG IV SOLR
40.0000 mg | Freq: Two times a day (BID) | INTRAVENOUS | Status: DC
  Administered 2024-03-11: 40 mg via INTRAVENOUS
  Filled 2024-03-11: qty 10

## 2024-03-11 MED ORDER — MIDAZOLAM HCL (PF) 5 MG/ML IJ SOLN: 2.0000 mg | INTRAMUSCULAR | Status: DC | PRN

## 2024-03-11 MED ORDER — MIDAZOLAM-SODIUM CHLORIDE 100-0.9 MG/100ML-% IV SOLN
0.0000 mg/h | INTRAVENOUS | Status: DC
  Administered 2024-03-11: 1 mg/h via INTRAVENOUS
  Filled 2024-03-11: qty 100

## 2024-03-11 MED ORDER — HYDROMORPHONE BOLUS VIA INFUSION
0.2500 mg | INTRAVENOUS | Status: DC | PRN
  Administered 2024-03-11 (×3): 1 mg via INTRAVENOUS

## 2024-03-11 MED ORDER — GLYCOPYRROLATE 0.2 MG/ML IJ SOLN: 0.2000 mg | INTRAMUSCULAR | Status: DC | PRN

## 2024-03-11 MED ORDER — POTASSIUM CHLORIDE 10 MEQ/50ML IV SOLN
10.0000 meq | INTRAVENOUS | Status: DC
  Administered 2024-03-11 (×4): 10 meq via INTRAVENOUS
  Filled 2024-03-11 (×6): qty 50

## 2024-03-11 MED ORDER — GLYCOPYRROLATE 1 MG PO TABS: 1.0000 mg | ORAL_TABLET | ORAL | Status: DC | PRN

## 2024-03-11 MED ORDER — SODIUM CHLORIDE 0.9 % IV SOLN: INTRAVENOUS | Status: DC

## 2024-03-11 MED ORDER — HYDROMORPHONE HCL-NACL 50-0.9 MG/50ML-% IV SOLN
0.5000 mg/h | INTRAVENOUS | Status: DC
  Administered 2024-03-11: 2 mg/h via INTRAVENOUS
  Filled 2024-03-11: qty 50

## 2024-03-11 MED ORDER — MIDAZOLAM HCL (PF) 2 MG/2ML IJ SOLN: 1.0000 mg | INTRAMUSCULAR | Status: DC | PRN

## 2024-03-11 MED ORDER — ACETAMINOPHEN 650 MG RE SUPP: 650.0000 mg | Freq: Four times a day (QID) | RECTAL | Status: DC | PRN

## 2024-03-11 MED ORDER — MIDAZOLAM BOLUS VIA INFUSION (WITHDRAWAL LIFE SUSTAINING TX)
2.0000 mg | INTRAVENOUS | Status: DC | PRN
  Administered 2024-03-11 (×2): 2 mg via INTRAVENOUS

## 2024-03-11 MED ORDER — ACETAMINOPHEN 325 MG PO TABS: 650.0000 mg | ORAL_TABLET | Freq: Four times a day (QID) | ORAL | Status: DC | PRN

## 2024-03-11 MED ORDER — POLYVINYL ALCOHOL 1.4 % OP SOLN: 1.0000 [drp] | Freq: Four times a day (QID) | OPHTHALMIC | Status: DC | PRN

## 2024-03-17 NOTE — Progress Notes (Signed)
 NAME:  Victoria [name], MRN:  [id number], DOB:  1945-09-04, LOS: 12 ADMISSION DATE:  02/27/2024, CONSULTATION DATE:  02/28/24 REFERRING MD:  Haze CHIEF COMPLAINT:  Acute respiratory failure    History of Present Illness:  78 yo F with PMH COPD on 2L O2 at home, OSA, T2DM, CKD 3A who presented to [facility] on 10/14 with altered mental status found to have multiple embolic strokes and strep bacteremia, initially arriving with increased lethargy and respiratory distress requiring invasive mechanical ventilation.  [name] last known normal 10/10, was found down by EMS on 10/13.  Receiving empirical treatment for endocarditis per ID.  There is also concern for HAP, with Staph aureus growing on sputum culture thus far.  Patient will transition back to ceftriaxone monotherapy.  Patient remains overloaded, receiving aggressive diuresis with overall stable kidney function.  Patient remains intubated, on fentanyl  and propofol  drip, no longer on pressors.  Pertinent  Medical History   Past Medical History:  Diagnosis Date   Abdominal aneurysm    Dr. Harvey follows lLOV 2 ''17 per pt around 2 cm   Anemia    as a child   Arthritis    left ankle, right knee, right SI joint, wrists, lower back   Breast cancer in female [facility])    Left   COPD (chronic obstructive pulmonary disease) (HCC)    ephysema-Dr. 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 Eletha in [place]   Sleep apnea    cpap used sometimes, uses oxygen  concentrator 2 l/m nasally bedtime   Varicella as child     Significant Hospital  Events: Including procedures, antibiotic start and stop dates in addition to other pertinent events   10/14: Admitted to ICU requiring IMV 10/17: No embolic source seen on TEE. Treating for presumed infective endocarditis.  10/18: Failing PSV weans with agitation. Transient episodes of bradycardia.  10/21: Transition from CTX to Penicillin . Trialed transition from propofol  to precedex but did not tolerate.  10/22: Initiate Zosyn for empiric HAP treatment. Stopped penicillin . Vancomycin added for CNS coverage.  10/23: D/c Zosyn and Vanc. Initiate Cefepime.  10/24: D/c Cefepime. Initiate CTX monotherapy.  Staph aureus growing on sputum culture. Repeat MRI for continued poor neuro exam. 10/25 family meeting  Interim History / Subjective:  Stool looks a little bloodier this am Remains comatose, hypertensive  Objective    Blood pressure (!) 181/58, pulse (!) 106, temperature 99.5 F (37.5 C), resp. rate (!) 24, height 5' 4.5 (1.638 m), weight 95.3 kg, SpO2 96%. CVP:  [3 mmHg-12 mmHg] 12 mmHg  Vent Mode: PRVC FiO2 (%):  [40 %] 40 % Set Rate:  [18 bmp] 18 bmp Vt Set:  [420 mL] 420 mL PEEP:  [5 cmH20] 5 cmH20 Plateau Pressure:  [17 cmH20-23 cmH20] 23 cmH20   Intake/Output Summary (Last 24 hours) at 03-17-24 0835 Last data filed at Mar 17, 2024 0809 Gross per 24 hour  Intake 3794.81 ml  Output 4730 ml  Net -935.19 ml  Filed Weights   03/08/24 0700 03/09/24 0444 03/10/24 0500  Weight: 102.9 kg 99.3 kg 95.3 kg    Examination: Agonal breathing pattern on vent Maroon material in rectal tube Improved anasarca Lungs rhonci bilateral + accessory muscle use Abd soft, +BS  CBG a little high Cr better Sodium up  Resolved problem list   Assessment and Plan  Suspected profound brain injury related to anoxia and septic emboli, progressive injury on serial MRI 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  Overall do not see a good outcome here regardless of treatment approach.  Academically we would give another month or so for full neuro-prognostication given lack of definitive (ie zero FPR) imaging/exam evidence for poor outcome but I worry in interim she would suffer and this wait and see would not be in line with her previously stated GOC.  Family agrees and are coming in later today.  Recommend we transition to comfort when they are ready so that she can pass peacefully.  Housekeeping if they decide on more aggressive route- RE: GIB, lovenox  stopped, recheck PM CBC Electrolyte derangements and free water deficit being addressed On rocephin then transition to prolonged course of PCN per Dr. Fleeta Dam's note 10/21 RE: WOB, TG too high to keep going w/ prop; precedex/fent not really doing anything nor will much of anything given that this is neurological: will try dilaudid  and PRN versed  and versed  gtt if this helps better  42 min cc time  Rolan Sharps MD PCCM

## 2024-03-17 NOTE — Progress Notes (Signed)
 eLink Physician-Brief Progress Note Patient Name: Victoria [name] DOB: 10/13/1945 MRN: [id number]   Date of Service  04/07/2024  HPI/Events of Note  PT reported to have maroon stools coming out of rectal tube. ~456ml.  CBC had just been drawn. Hgb 7.7 from 8.8 yesterday.  In afib RVR, HYPERtensive.   eICU Interventions  Will hold lovenox .  Increase PPI to BID.  Repeat CBC in 4 hours.  Held off transfusion for now.  Will need to clarify goals of care - Pt for possible transition to comfort measures.         Jayra Choyce M DELA CRUZ April 07, 2024, 6:32 AM

## 2024-03-17 NOTE — Progress Notes (Signed)
 Patients family ready for one way extubation.  Patient still breathing greater than 20 breaths/min.  MD Claudene advised to increase sedation medication at this time.

## 2024-03-17 NOTE — Progress Notes (Signed)
 Wasted 25 mL of IV Dilaudid , witnessed by Antonio Bihari RN.

## 2024-03-17 NOTE — Evaluation (Signed)
 RT Evaluate and Treat Note  2024/04/09   Breathing is (select one): Same as normal    The following was found on auscultation (select multiple):  Bilateral Breath Sounds: Diminished (April 09, 2024 0820)  R Upper  Breath Sounds: Diminished (04/09/2024 0820) L Upper Breath Sounds: Diminished (04-09-2024 0820) R Lower Breath Sounds: Diminished (04-09-2024 0820) L Lower Breath Sounds: Diminished (2024-04-09 0820)    Cough Assessment: Cough: None (03/08/24 2000)    Most Recent Chest Xray:... ( Narrative & Impression  EXAM: 1 VIEW(S) XRAY OF THE CHEST 03/10/2024 04:59:18 AM   COMPARISON: 03/05/2024   CLINICAL HISTORY:   FINDINGS:   LINES, TUBES AND DEVICES: ETT in place with tip 4.4 cm above the carina. Enteric tube courses through chest to abdomen beyond field-of-view. Right upper extremity PICC with tip terminating over the SVC.   LUNGS AND PLEURA: Decreased pleural effusions. Improved bibasilar lung aeration. Diffuse interstitial opacities. Left basilar linear opacity, most consistent with atelectasis. No pneumothorax.   HEART AND MEDIASTINUM: Cardiomegaly, likely accentuated by technique. Atherosclerotic calcifications.   BONES AND SOFT TISSUES: No acute osseous abnormality.   IMPRESSION: 1. Decreased pleural effusions with improved bibasilar aeration. 2. Diffuse interstitial opacities, unchanged 3. Left basilar atelectasis.     The following medications and/or interventions were ordered/changed/discontinued as part of the Respiratory Treatment protocol:   Medication Changes: none   Airway Clearance Changes: none   Oxygen  Therapy Changes:none

## 2024-03-17 NOTE — Procedures (Signed)
 Extubation Procedure Note  Patient Details:   Name: Victoria [name] DOB: [birth year] MRN: [id number]   Airway Documentation:    Vent end date: 2024-04-01 Vent end time: 1351   Evaluation  O2 sats: currently acceptable Complications: No apparent complications Patient did not tolerate procedure well. Bilateral Breath Sounds: Diminished   No  Aury Scollard 04/01/2024, 1:52 PM  Pt extubated to comfort at this time.  Family at bedside.

## 2024-03-17 DEATH — deceased

## 2024-03-20 ENCOUNTER — Ambulatory Visit: Admitting: Internal Medicine

## 2024-03-22 ENCOUNTER — Ambulatory Visit: Admitting: Infectious Diseases

## 2024-04-02 ENCOUNTER — Inpatient Hospital Stay: Admitting: Adult Health

## 2024-04-16 NOTE — Death Summary Note (Signed)
 DEATH SUMMARY   Patient Details  Name: Victoria [name] MRN: [id number] DOB: [birth year]  Admission/Discharge Information   Admit Date:  08-Mar-2024  Date of Death: Date of Death: 2024-03-21  Time of Death: Time of Death: 04-19-42  Length of Stay: 2024/04/07  Referring Physician: Arloa Jarvis, NP    Diagnoses  Preliminary cause of death: streptococcal endocarditis x 1 month Secondary Diagnoses (including complications and co-morbidities):  Principal Problem:   Endocarditis Active Problems:   Altered mental status   Cerebrovascular accident (CVA) due to embolism of cerebral artery (HCC)   Bacteremia due to Streptococcus   Streptococcal bacteremia   Acute bacterial endocarditis   Pacemaker infection   Sepsis (HCC) Staph aureus HCAP  Brief Hospital Course (including significant findings, care, treatment, and services provided and events leading to death)  78 yo F with PMH COPD on 2L O2 at home, OSA, T2DM, CKD 3A who presented to [facility] on 10/14 with altered mental status found to have multiple embolic strokes and strep bacteremia, initially arriving with increased lethargy and respiratory distress requiring invasive mechanical ventilation.  [name] last known normal 10/10, was found down by EMS on 03-08-24.   Receiving empirical treatment for endocarditis per ID.  There is also concern for HAP, with Staph aureus growing on sputum culture thus far.  Patient will transition back to ceftriaxone monotherapy.  Patient remains overloaded, receiving aggressive diuresis with overall stable kidney function.  Patient remains intubated, on fentanyl  and propofol  drip, no longer on pressors.  10/14: Admitted to ICU requiring IMV 10/17: No embolic source seen on TEE. Treating for presumed infective endocarditis.  10/18: Failing PSV weans with agitation. Transient episodes of bradycardia.  10/21: Transition from CTX to Penicillin . Trialed transition from propofol  to precedex but did not tolerate.  10/22:  Initiate Zosyn for empiric HAP treatment. Stopped penicillin . Vancomycin added for CNS coverage.  10/23: D/c Zosyn and Vanc. Initiate Cefepime.  10/24: D/c Cefepime. Initiate CTX monotherapy.  Staph aureus growing on sputum culture. Repeat MRI for continued poor neuro exam. 10/25 family meeting  Serial brain MRIs with progressive strokes.  She remained poorly responsive.  Her COPD was pretty severe at baseline and QoL was slowly decreasing prior to admission.  After discussing with family, we transitioned to a comfort-based approach and patient passed peacefully.  Pertinent Labs and Studies  Significant Diagnostic Studies DG Abd 1 View Result Date: 03/21/2024 EXAM: 1 VIEW XRAY OF THE ABDOMEN March 21, 2024 07:00:00 AM COMPARISON: 03/04/2024 CLINICAL HISTORY: Abdominal distention. FINDINGS: LINES, TUBES AND DEVICES: Enteric tube in place with tip overlying the gastric antrum. Foley catheter in place overlying the pelvis. BOWEL: Interval decrease in the caliber of bowel loops compared to the prior study. SOFT TISSUES: No opaque urinary calculi. BONES: No acute osseous abnormality. IMPRESSION: 1. Interval decrease in bowel loop caliber compared to the prior study. Electronically signed by: Waddell Calk MD 2024-03-21 07:39 AM EDT RP Workstation: [id number]   DG Chest Port 1 View Result Date: 03/10/2024 EXAM: 1 VIEW(S) XRAY OF THE CHEST 03/10/2024 04:59:18 AM COMPARISON: 03/05/2024 CLINICAL HISTORY: FINDINGS: LINES, TUBES AND DEVICES: ETT in place with tip 4.4 cm above the carina. Enteric tube courses through chest to abdomen beyond field-of-view. Right upper extremity PICC with tip terminating over the SVC. LUNGS AND PLEURA: Decreased pleural effusions. Improved bibasilar lung aeration. Diffuse interstitial opacities. Left basilar linear opacity, most consistent with atelectasis. No pneumothorax. HEART AND MEDIASTINUM: Cardiomegaly, likely accentuated by technique. Atherosclerotic calcifications. BONES AND  SOFT TISSUES: No acute osseous abnormality.  IMPRESSION: 1. Decreased pleural effusions with improved bibasilar aeration. 2. Diffuse interstitial opacities, unchanged 3. Left basilar atelectasis. Electronically signed by: Waddell Calk MD 03/10/2024 05:52 AM EDT RP Workstation: [id number]   MR BRAIN WO CONTRAST Result Date: 03/09/2024 EXAM: MRI BRAIN WITHOUT CONTRAST 03/09/2024 04:35:00 PM TECHNIQUE: Multiplanar multisequence MRI of the head/brain was performed without the administration of intravenous contrast. COMPARISON: CTA head and neck 03/01/2024. MRI head 02/28/2024. CLINICAL HISTORY: Stroke, follow up. FINDINGS: The examination is intermittently up to moderately motion degraded. BRAIN AND VENTRICLES: Restricted diffusion associated with bilateral cerebral infarcts on the prior MRI has largely resolved, with mild residual restricted diffusion in the right temporal lobe. However, there are numerous new small acute infarcts in both cerebral hemispheres, most notably involving bilateral frontoparietal white matter at the level of the centrum semiovale as well as a 2.7 cm acute infarct in the lateral left occipital lobe. Many of these new infarcts are along ACA-MCA and MCA-PCA border zones potentially indicating watershed ischemia. There is mild associated cytotoxic edema without midline shift or other significant mass effect. A background of mild chronic small vessel ischemic disease is noted in the cerebral white matter. No intracranial hemorrhage, hydrocephalus, or extra axial fluid collection is identified. There is mild cerebral atrophy. Major intracranial vascular flow voids are preserved. ORBITS: No acute abnormality. SINUSES AND MASTOIDS: Moderate mucosal thickening and fluid in the paranasal sinuses and bilateral mastoid fluid which may be related to intubation. BONES AND SOFT TISSUES: Normal marrow signal. No acute soft tissue abnormality. IMPRESSION: 1. Numerous new small acute infarcts in both  cerebral hemispheres as above. No hemorrhage or mass effect. Electronically signed by: Dasie Hamburg MD 03/09/2024 05:00 PM EDT RP Workstation: [id number]   DG CHEST PORT 1 VIEW Result Date: 03/05/2024 CLINICAL DATA:  5626 Acute respiratory failure (HCC) [id number] History of ETT [id number] EXAM: PORTABLE CHEST - 1 VIEW COMPARISON:  03/02/2024 FINDINGS: Endotracheal tube terminates in the mid trachea. Weighted feeding tube courses below the diaphragm with the distal tip not included in the field of view. Right PICC terminates in the lower SVC. Lower lung volumes. Interstitial opacities throughout both lungs. Small bilateral pleural effusions. No pneumothorax. No cardiomegaly. IMPRESSION: 1. Interstitial opacities in both lungs are more prominent, possibly due to mild interstitial edema. Small bilateral pleural effusions are also present with bibasilar atelectasis. 2. Right PICC has been placed in the interim, terminating in the lower SVC. Similarly positioned endotracheal and weighted feeding tubes. Electronically Signed   By: Rogelia Myers M.D.   On: 03/05/2024 10:31   DG Abd Portable 1V Result Date: 03/04/2024 CLINICAL DATA:  Abdominal distension. EXAM: PORTABLE ABDOMEN - 1 VIEW COMPARISON:  Abdominal radiograph dated 02/28/2024 FINDINGS: Enteric tube with tip in the mid abdomen, likely in the distal stomach. Diffusely dilated bowel loops again noted. No free air. Osteopenia with degenerative changes of spine. Small bilateral pleural effusions and bibasilar atelectasis or infiltrate. IMPRESSION: 1. Enteric tube with tip in the distal stomach. 2. Diffusely dilated bowel loops. Electronically Signed   By: Vanetta Chou M.D.   On: 03/04/2024 18:44   VAS US  UPPER EXTREMITY VENOUS DUPLEX Result Date: 03/04/2024 UPPER VENOUS STUDY  Patient Name:  KARIELLE DAVIDOW  Date of Exam:   03/03/2024 Medical Rec #: [id number]             Accession #:    [id number] Date of Birth: 1945/08/11             Patient  Gender: F Patient  Age:   43 years Exam Location:  [facility] Procedure:      VAS US  UPPER EXTREMITY VENOUS DUPLEX Referring Phys: PAULA SOUTHERLY --------------------------------------------------------------------------------  Indications: Edema Limitations: Edema, ventilation and poor ultrasound/tissue interface. Comparison Study: No prior UEV on file Performing Technologist: Alberta Lis RVS  Examination Guidelines: A complete evaluation includes B-mode imaging, spectral Doppler, color Doppler, and power Doppler as needed of all accessible portions of each vessel. Bilateral testing is considered an integral part of a complete examination. Limited examinations for reoccurring indications may be performed as noted.  Right Findings: +----------+------------+---------+-----------+----------+-------+ RIGHT     CompressiblePhasicitySpontaneousPropertiesSummary +----------+------------+---------+-----------+----------+-------+ Subclavian               Yes       Yes                      +----------+------------+---------+-----------+----------+-------+  Left Findings: +----------+------------+---------+-----------+----------+-----------------+ LEFT      CompressiblePhasicitySpontaneousProperties     Summary      +----------+------------+---------+-----------+----------+-----------------+ IJV                      Yes       Yes                                +----------+------------+---------+-----------+----------+-----------------+ Subclavian               Yes       Yes                                +----------+------------+---------+-----------+----------+-----------------+ Axillary      Full       Yes       Yes                                +----------+------------+---------+-----------+----------+-----------------+ Brachial      Full       Yes       Yes                                 +----------+------------+---------+-----------+----------+-----------------+ Radial        Full       Yes       Yes                                +----------+------------+---------+-----------+----------+-----------------+ Ulnar                                                patent by color  +----------+------------+---------+-----------+----------+-----------------+ Cephalic      None                                  Age Indeterminate +----------+------------+---------+-----------+----------+-----------------+ Basilic       Full                                                    +----------+------------+---------+-----------+----------+-----------------+  Summary:  Right: No evidence of thrombosis in the subclavian.  Left: No evidence of deep vein thrombosis in the upper extremity. Findings consistent with age indeterminate superficial vein thrombosis involving the left cephalic vein.  *See table(s) above for measurements and observations.  Diagnosing physician: Lonni Gaskins MD Electronically signed by Lonni Gaskins MD on 03/04/2024 at 10:33:07 AM.    Final     Microbiology No results found for this or any previous visit (from the past 240 hours).  Lab Basic Metabolic Panel: No results for input(s): NA, K, CL, CO2, GLUCOSE, BUN, CREATININE, CALCIUM , MG, PHOS in the last 168 hours. Liver Function Tests: No results for input(s): AST, ALT, ALKPHOS, BILITOT, PROT, ALBUMIN in the last 168 hours. No results for input(s): LIPASE, AMYLASE in the last 168 hours. No results for input(s): AMMONIA in the last 168 hours. CBC: No results for input(s): WBC, NEUTROABS, HGB, HCT, MCV, PLT in the last 168 hours. Cardiac Enzymes: No results for input(s): CKTOTAL, CKMB, CKMBINDEX, TROPONINI in the last 168 hours. Sepsis Labs: No results for input(s): PROCALCITON, WBC, LATICACIDVEN in the last 168  hours.     Toribio JAYSON Sharps 04/01/2024, 5:48 PM

## 2024-09-18 ENCOUNTER — Ambulatory Visit: Admitting: Hematology and Oncology
